# Patient Record
Sex: Male | Born: 1955
Health system: Southern US, Community
[De-identification: ages and names within clinical notes are randomized; demographics above are authoritative.]

## PROBLEM LIST (undated history)

## (undated) DIAGNOSIS — G4733 Obstructive sleep apnea (adult) (pediatric): Secondary | ICD-10-CM

## (undated) DIAGNOSIS — I10 Essential (primary) hypertension: Secondary | ICD-10-CM

## (undated) DIAGNOSIS — K602 Anal fissure, unspecified: Secondary | ICD-10-CM

## (undated) DIAGNOSIS — C801 Malignant (primary) neoplasm, unspecified: Secondary | ICD-10-CM

## (undated) DIAGNOSIS — R531 Weakness: Secondary | ICD-10-CM

## (undated) DIAGNOSIS — Z87442 Personal history of urinary calculi: Secondary | ICD-10-CM

## (undated) DIAGNOSIS — T7840XA Allergy, unspecified, initial encounter: Secondary | ICD-10-CM

## (undated) DIAGNOSIS — K760 Fatty (change of) liver, not elsewhere classified: Secondary | ICD-10-CM

## (undated) DIAGNOSIS — D49519 Neoplasm of unspecified behavior of unspecified kidney: Secondary | ICD-10-CM

## (undated) DIAGNOSIS — G4730 Sleep apnea, unspecified: Secondary | ICD-10-CM

## (undated) DIAGNOSIS — G20 Parkinson's disease: Principal | ICD-10-CM

## (undated) DIAGNOSIS — N201 Calculus of ureter: Secondary | ICD-10-CM

## (undated) DIAGNOSIS — E119 Type 2 diabetes mellitus without complications: Secondary | ICD-10-CM

## (undated) DIAGNOSIS — Z9989 Dependence on other enabling machines and devices: Secondary | ICD-10-CM

## (undated) DIAGNOSIS — F32A Depression, unspecified: Secondary | ICD-10-CM

## (undated) DIAGNOSIS — K219 Gastro-esophageal reflux disease without esophagitis: Secondary | ICD-10-CM

## (undated) DIAGNOSIS — F419 Anxiety disorder, unspecified: Secondary | ICD-10-CM

## (undated) DIAGNOSIS — J45909 Unspecified asthma, uncomplicated: Secondary | ICD-10-CM

## (undated) DIAGNOSIS — E785 Hyperlipidemia, unspecified: Secondary | ICD-10-CM

## (undated) DIAGNOSIS — Z8739 Personal history of other diseases of the musculoskeletal system and connective tissue: Secondary | ICD-10-CM

## (undated) DIAGNOSIS — E559 Vitamin D deficiency, unspecified: Secondary | ICD-10-CM

## (undated) DIAGNOSIS — G4752 REM sleep behavior disorder: Secondary | ICD-10-CM

## (undated) HISTORY — DX: Vitamin D deficiency, unspecified: E55.9

## (undated) HISTORY — DX: Hyperlipidemia, unspecified: E78.5

## (undated) HISTORY — DX: Anal fissure, unspecified: K60.2

## (undated) HISTORY — DX: REM sleep behavior disorder: G47.52

## (undated) HISTORY — DX: Allergy, unspecified, initial encounter: T78.40XA

## (undated) HISTORY — DX: Weakness: R53.1

## (undated) HISTORY — DX: Depression, unspecified: F32.A

## (undated) HISTORY — DX: Unspecified asthma, uncomplicated: J45.909

## (undated) HISTORY — DX: Malignant (primary) neoplasm, unspecified: C80.1

## (undated) HISTORY — DX: Sleep apnea, unspecified: G47.30

## (undated) HISTORY — DX: Anxiety disorder, unspecified: F41.9

## (undated) HISTORY — DX: Fatty (change of) liver, not elsewhere classified: K76.0

## (undated) HISTORY — DX: Gastro-esophageal reflux disease without esophagitis: K21.9

## (undated) HISTORY — PX: OTHER SURGICAL HISTORY: SHX169

## (undated) HISTORY — DX: Calculus of ureter: N20.1

## (undated) HISTORY — DX: Parkinson's disease: G20

---

## 1983-05-09 HISTORY — PX: NASAL SEPTUM SURGERY: SHX37

## 1999-07-27 ENCOUNTER — Encounter: Admission: RE | Admit: 1999-07-27 | Discharge: 1999-07-27 | Payer: Self-pay | Admitting: Internal Medicine

## 1999-07-27 ENCOUNTER — Encounter: Payer: Self-pay | Admitting: Internal Medicine

## 1999-08-01 ENCOUNTER — Encounter: Admission: RE | Admit: 1999-08-01 | Discharge: 1999-08-01 | Payer: Self-pay | Admitting: Internal Medicine

## 1999-08-01 ENCOUNTER — Encounter: Payer: Self-pay | Admitting: Internal Medicine

## 1999-08-10 ENCOUNTER — Encounter: Admission: RE | Admit: 1999-08-10 | Discharge: 1999-08-10 | Payer: Self-pay | Admitting: Internal Medicine

## 1999-08-10 ENCOUNTER — Encounter: Payer: Self-pay | Admitting: Internal Medicine

## 2000-08-14 ENCOUNTER — Encounter: Payer: Self-pay | Admitting: Internal Medicine

## 2000-08-14 ENCOUNTER — Encounter: Admission: RE | Admit: 2000-08-14 | Discharge: 2000-08-14 | Payer: Self-pay | Admitting: Internal Medicine

## 2000-10-08 ENCOUNTER — Encounter: Admission: RE | Admit: 2000-10-08 | Discharge: 2000-10-08 | Payer: Self-pay | Admitting: Internal Medicine

## 2000-10-08 ENCOUNTER — Encounter: Payer: Self-pay | Admitting: Internal Medicine

## 2000-10-12 ENCOUNTER — Ambulatory Visit (HOSPITAL_COMMUNITY): Admission: RE | Admit: 2000-10-12 | Discharge: 2000-10-12 | Payer: Self-pay

## 2000-10-12 HISTORY — PX: ANAL FISSURE REPAIR: SHX2312

## 2001-10-09 ENCOUNTER — Ambulatory Visit (HOSPITAL_COMMUNITY): Admission: RE | Admit: 2001-10-09 | Discharge: 2001-10-09 | Payer: Self-pay | Admitting: Gastroenterology

## 2003-02-26 ENCOUNTER — Ambulatory Visit (HOSPITAL_COMMUNITY): Admission: RE | Admit: 2003-02-26 | Discharge: 2003-02-26 | Payer: Self-pay | Admitting: Internal Medicine

## 2003-02-26 ENCOUNTER — Encounter: Payer: Self-pay | Admitting: Internal Medicine

## 2003-05-22 ENCOUNTER — Ambulatory Visit (HOSPITAL_COMMUNITY): Admission: RE | Admit: 2003-05-22 | Discharge: 2003-05-22 | Payer: Self-pay | Admitting: Internal Medicine

## 2003-09-15 ENCOUNTER — Ambulatory Visit (HOSPITAL_BASED_OUTPATIENT_CLINIC_OR_DEPARTMENT_OTHER): Admission: RE | Admit: 2003-09-15 | Discharge: 2003-09-15 | Payer: Self-pay | Admitting: Orthopaedic Surgery

## 2003-09-15 HISTORY — PX: OTHER SURGICAL HISTORY: SHX169

## 2006-05-19 ENCOUNTER — Emergency Department (HOSPITAL_COMMUNITY): Admission: EM | Admit: 2006-05-19 | Discharge: 2006-05-19 | Payer: Self-pay | Admitting: Family Medicine

## 2011-05-15 ENCOUNTER — Other Ambulatory Visit (HOSPITAL_COMMUNITY): Payer: Self-pay | Admitting: Internal Medicine

## 2011-05-15 ENCOUNTER — Ambulatory Visit (HOSPITAL_COMMUNITY)
Admission: RE | Admit: 2011-05-15 | Discharge: 2011-05-15 | Disposition: A | Discharge status: Home / Self Care | Payer: 59 | Source: Ambulatory Visit | Attending: Internal Medicine | Admitting: Internal Medicine

## 2011-05-15 DIAGNOSIS — M79609 Pain in unspecified limb: Secondary | ICD-10-CM | POA: Insufficient documentation

## 2011-06-05 ENCOUNTER — Emergency Department (HOSPITAL_COMMUNITY)
Admission: EM | Admit: 2011-06-05 | Discharge: 2011-06-05 | Disposition: A | Discharge status: Home / Self Care | Payer: 59 | Attending: Emergency Medicine | Admitting: Emergency Medicine

## 2011-06-05 ENCOUNTER — Emergency Department (HOSPITAL_COMMUNITY): Payer: 59

## 2011-06-05 ENCOUNTER — Encounter (HOSPITAL_COMMUNITY): Payer: Self-pay | Admitting: General Practice

## 2011-06-05 DIAGNOSIS — K7689 Other specified diseases of liver: Secondary | ICD-10-CM | POA: Insufficient documentation

## 2011-06-05 DIAGNOSIS — N201 Calculus of ureter: Secondary | ICD-10-CM | POA: Insufficient documentation

## 2011-06-05 DIAGNOSIS — R109 Unspecified abdominal pain: Secondary | ICD-10-CM | POA: Insufficient documentation

## 2011-06-05 DIAGNOSIS — I10 Essential (primary) hypertension: Secondary | ICD-10-CM | POA: Insufficient documentation

## 2011-06-05 DIAGNOSIS — N1330 Unspecified hydronephrosis: Secondary | ICD-10-CM | POA: Insufficient documentation

## 2011-06-05 DIAGNOSIS — R10813 Right lower quadrant abdominal tenderness: Secondary | ICD-10-CM | POA: Insufficient documentation

## 2011-06-05 HISTORY — DX: Essential (primary) hypertension: I10

## 2011-06-05 LAB — DIFFERENTIAL
Basophils Absolute: 0 10*3/uL (ref 0.0–0.1)
Basophils Relative: 0 % (ref 0–1)
Eosinophils Relative: 1 % (ref 0–5)
Monocytes Relative: 8 % (ref 3–12)
Neutrophils Relative %: 59 % (ref 43–77)

## 2011-06-05 LAB — BASIC METABOLIC PANEL
BUN: 16 mg/dL (ref 6–23)
CO2: 26 mEq/L (ref 19–32)
Calcium: 9.8 mg/dL (ref 8.4–10.5)
Chloride: 99 mEq/L (ref 96–112)
Creatinine, Ser: 1.27 mg/dL (ref 0.50–1.35)
GFR calc Af Amer: 71 mL/min — ABNORMAL LOW (ref >90)
GFR calc non Af Amer: 61 mL/min — ABNORMAL LOW (ref >90)
Glucose, Bld: 139 mg/dL — ABNORMAL HIGH (ref 70–99)
Potassium: 3.4 mEq/L — ABNORMAL LOW (ref 3.5–5.1)
Sodium: 137 mEq/L (ref 135–145)

## 2011-06-05 LAB — URINALYSIS, ROUTINE W REFLEX MICROSCOPIC
Bilirubin Urine: NEGATIVE
Glucose, UA: NEGATIVE mg/dL
Hgb urine dipstick: NEGATIVE
Ketones, ur: NEGATIVE mg/dL
Leukocytes, UA: NEGATIVE
Specific Gravity, Urine: 1.02 (ref 1.005–1.030)
Urobilinogen, UA: 0.2 mg/dL (ref 0.0–1.0)

## 2011-06-05 LAB — CBC
Hemoglobin: 15.9 g/dL (ref 13.0–17.0)
MCH: 29.7 pg (ref 26.0–34.0)
Platelets: 161 10*3/uL (ref 150–400)
RDW: 14 % (ref 11.5–15.5)

## 2011-06-05 MED ORDER — OXYCODONE-ACETAMINOPHEN 5-325 MG PO TABS: 2.0000 | ORAL_TABLET | ORAL | Status: AC | PRN

## 2011-06-05 MED ORDER — PROMETHAZINE HCL 25 MG PO TABS: 25.0000 mg | ORAL_TABLET | Freq: Four times a day (QID) | ORAL | Status: DC | PRN

## 2011-06-05 MED ORDER — ONDANSETRON HCL 4 MG/2ML IJ SOLN
4.0000 mg | Freq: Once | INTRAMUSCULAR | Status: AC
  Administered 2011-06-05: 4 mg via INTRAVENOUS
  Filled 2011-06-05: qty 2

## 2011-06-05 MED ORDER — MORPHINE SULFATE 4 MG/ML IJ SOLN
4.0000 mg | Freq: Once | INTRAMUSCULAR | Status: AC
  Administered 2011-06-05: 4 mg via INTRAVENOUS
  Filled 2011-06-05: qty 1

## 2011-06-05 MED ORDER — KETOROLAC TROMETHAMINE 30 MG/ML IJ SOLN
30.0000 mg | Freq: Once | INTRAMUSCULAR | Status: AC
  Administered 2011-06-05: 30 mg via INTRAVENOUS
  Filled 2011-06-05: qty 1

## 2011-06-05 MED ORDER — SODIUM CHLORIDE 0.9 % IV SOLN
INTRAVENOUS | Status: DC
  Administered 2011-06-05: 08:00:00 via INTRAVENOUS

## 2011-06-05 NOTE — ED Notes (Signed)
Patient transported to CT 

## 2011-06-05 NOTE — ED Provider Notes (Cosign Needed Addendum)
History     CSN: [id number]  Arrival date & time 06/05/11  0720   First MD Initiated Contact with Patient 06/05/11 [phone]      Chief Complaint  Patient presents with  . Nephrolithiasis    (Consider location/radiation/quality/duration/timing/severity/associated sxs/prior treatment) The history is provided by the patient.   the patient is a 56 year old, male, with a history of kidney stones.  His last stone was greater than 20 years ago.  He presents with right-sided flank pain that began last evening and now is radiating toward his right testicle.  It is accompanied by nausea, without any vomiting.  He has not noted any hematuria.  He has not had fevers, chills.  He denies pain anywhere else.  In any other symptoms.  Past Medical History  Diagnosis Date  . Kidney stones   . Bronchitis   . Hypertension     Past Surgical History  Procedure Date  . Knee surgery   . Enterocutaneous fistula closure   . Nasal septum surgery     No family history on file.  History  Substance Use Topics  . Smoking status: Never Smoker   . Smokeless tobacco: Not on file  . Alcohol Use: No      Review of Systems  Constitutional: Negative for fever and chills.  Respiratory: Negative for cough and shortness of breath.   Cardiovascular: Negative for chest pain.  Gastrointestinal: Positive for nausea. Negative for vomiting, abdominal pain and diarrhea.  Genitourinary: Positive for flank pain. Negative for dysuria and hematuria.  Neurological: Negative for headaches.  Psychiatric/Behavioral: Negative for confusion.  All other systems reviewed and are negative.    Allergies  Review of patient's allergies indicates no known allergies.  Home Medications   Current Outpatient Rx  Name Route Sig Dispense Refill  . ALLOPURINOL 100 MG PO TABS Oral Take 300 mg by mouth daily.     . COLCHICINE 0.6 MG PO TABS Oral Take 0.6 mg by mouth 2 (two) times daily.    Marland Kitchen ROSUVASTATIN CALCIUM 10 MG PO TABS  Oral Take 10 mg by mouth daily.    Marland Kitchen VALSARTAN 320 MG PO TABS Oral Take 320 mg by mouth daily.    Marland Kitchen VALSARTAN-HYDROCHLOROTHIAZIDE 320-25 MG PO TABS Oral Take 1 tablet by mouth daily.      BP 168/91  Pulse 85  Temp(Src) 97.7 F (36.5 C) (Oral)  Resp 20  SpO2 93%  Physical Exam  Vitals reviewed. Constitutional: He is oriented to person, place, and time. No distress.       Morbidly obese  HENT:  Head: Normocephalic and atraumatic.  Eyes: EOM are normal. Pupils are equal, round, and reactive to light.  Neck: Normal range of motion. Neck supple.  Cardiovascular: Normal rate, regular rhythm and normal heart sounds.   No murmur heard. Pulmonary/Chest: Effort normal and breath sounds normal. No respiratory distress. He has no wheezes. He has no rales.  Abdominal: Soft. Bowel sounds are normal. He exhibits no distension and no mass. There is tenderness. There is no rebound and no guarding.       Mild right lower quadrant tenderness, without any peritoneal signs  Musculoskeletal: Normal range of motion. He exhibits no edema and no tenderness.  Neurological: He is alert and oriented to person, place, and time. No cranial nerve deficit.  Skin: Skin is warm and dry. He is not diaphoretic.  Psychiatric: He has a normal mood and affect. His behavior is normal.    ED Course  Procedures (  including critical care time) 56 year old, male, complains of right flank pain, radiating to his right testicle with nausea.  He has a history of kidney stones.  Denies symptoms.  Anywhere else.  He is morbidly obese, in moderate discomfort.  We will establish an IV give him analgesics and antiemetics.  Perform laboratory testing, and a CAT scan for further evaluation.  Labs Reviewed  BASIC METABOLIC PANEL - Abnormal; Notable for the following:    Potassium 3.4 (*)    Glucose, Bld 139 (*)    GFR calc non Af Amer 61 (*)    GFR calc Af Amer 71 (*)    All other components within normal limits  CBC  DIFFERENTIAL   URINALYSIS, ROUTINE W REFLEX MICROSCOPIC   Ct Abdomen Pelvis Wo Contrast  06/05/2011  *RADIOLOGY REPORT*  Clinical Data:  Right-sided flank pain.  Question stone.  CT ABDOMEN AND PELVIS WITHOUT CONTRAST (CT UROGRAM)  Technique: Contiguous axial images of the abdomen and pelvis without oral or intravenous contrast were obtained.  Comparison: Report of a study from 10/08/2000.  Findings:  Exam is limited for evaluation of entities other than urinary tract calculi due to lack of oral or intravenous contrast.   Calcified right lower lobe granuloma. Normal heart size without pericardial or pleural effusion.  Distal left circumflex coronary artery atherosclerosis suspected on image 5 of series 2.  Moderate hepatic steatosis. A splenule.  Normal gallbladder and biliary tract.  Vascular calcification is positioned to the right of the common duct on image 34.  The pancreas is mildly fatty replaced.  Normal adrenal glands.  Bilateral renal collecting system calculi.  Mild to moderate right-sided urinary tract obstruction, secondary to a mid ureteric stone, which measures 4 mm on image 48 of series 2 and image 38 of the coronal series 5.  A retroaortic left renal vein. No retroperitoneal or retrocrural adenopathy.  Scattered colonic diverticula.  The colon is redundant, and position near the midline.  Normal terminal ileum.  Appendix not definitely identified.  Normal small bowel without abdominal ascites.  Right sided fat containing inguinal hernia. No pelvic adenopathy. Normal urinary bladder and prostate.  There is also low density in the right inguinal canal on the final images of the study.  No significant free fluid.  No acute osseous abnormality.  IMPRESSION:  1.  Mild to moderate right-sided urinary tract obstruction, secondary to a mid ureteric calculus. 2.  Bilateral renal calculi. 3.  Hepatic steatosis. 4.  Low density in the right inguinal canal.  Favor incompletely imaged inguinal position of the right  testicle.  Consider physical exam correlation.  Original Report Authenticated By: Consuello Bossier, M.D.     No diagnosis found.  9:49 AM  Says pain 2/10.  Wants to go home.    MDM  Flank pain Ureteral stone with mod. Obstruction.  No evidence of renal dysfunction or infection.  Pain controlled in the emergency department.        Nicholes Stairs, MD 06/05/11 9811  Nicholes Stairs, MD 06/05/11 1018

## 2011-06-05 NOTE — ED Notes (Signed)
Patient c/o flank pain last night. Patient stated that this morning when he awoke the pain was worse and began radiating outward towards his right testicle.

## 2011-06-07 ENCOUNTER — Other Ambulatory Visit: Payer: Self-pay | Admitting: Urology

## 2011-06-08 ENCOUNTER — Encounter (HOSPITAL_BASED_OUTPATIENT_CLINIC_OR_DEPARTMENT_OTHER): Payer: Self-pay | Admitting: *Deleted

## 2011-06-08 NOTE — Progress Notes (Addendum)
NPO AFTER MN. ARRIVES AT 0845. NEEDS ISTAT, EKG, KUB AND WEIGHT .  WILL TAKES CRESTOR AND DO INHALER AM OF SURG. W/ SIP OF WATER. ALSO, MAY TAKE DILAUDID/ PHENERGAN IF NEEDED.  REVIEWED CHART W/ DR EWELL MDA, STATES WEIGH PT AND MDA ON DOS WILL ASSESS.

## 2011-06-11 NOTE — H&P (Signed)
History of Present Illness  56 y/o obese white male with h/o nephrolithiasis presents for acute evaluation of right flank pain.  He began with right flank pain Sunday 06/04/11 which progressively worsened.  He presented to [facility] 06/05/11 for evaluation and CTU demonstrated a 4-66mm right mid ureteral calculus at the level of L4.  Bilateral nonobstructing renal calculi noted as well.  He was discharged home with percocet and phenergan.  He has not had any N/V but the pain has peristed intermittently severe and only minimal relief with percocet.  Currently, his pain is 6-7/10 in severity despite Percocet this morning.  He denies gross hematuria, dysuria, fever or chills.  His pain does radiate from the right flank into the RLQ abd and into the right inguinal region and testicle.  He has not seen a stone pass.  His last prior stone was 20+ years ago.   Past Medical History Problems  1. History of  Adult Sleep Apnea 780.57 2. History of  Anxiety (Symptom) 300.00 3. History of  Gastric Cancer V10.04 4. History of  Glaucoma 365.9 5. History of  Heartburn With Regurgitation 787.1 6. History of  Hypertension 401.9  Surgical History Problems  1. History of  Anal Fistulotomy (Subcutaneous) 2. History of  Knee Surgery 3. History of  Nose Surgery  Current Meds 1. Allopurinol TABS; Therapy: (Recorded:14Jul2011) to 2. ALPRAZolam 0.5 MG Oral Tablet; Therapy: (Recorded:14Jul2011) to 3. Colcrys 0.6 MG Oral Tablet; Therapy: (Recorded:30Jan2013) to 4. Diovan HCT 320-25 MG Oral Tablet; Therapy: 02May2011 to 5. Oxycodone-Acetaminophen 5-325 MG Oral Tablet; Therapy: (Recorded:30Jan2013) to  Allergies Medication  1. No Known Drug Allergies  Family History Problems  1. Maternal history of  Atrial Fibrillation 2. Maternal history of  Heart Disease V17.49 3. Maternal history of  Hypertension V17.49 4. Paternal history of  Lung Cancer V16.1  Social History Problems  1. Alcohol Use 2. Caffeine Use 3.  Marital History - Currently Married 4. Never A Smoker 5. Occupation: [employer]  Review of Systems Genitourinary, constitutional, skin, eye, otolaryngeal, hematologic/lymphatic, cardiovascular, pulmonary, endocrine, musculoskeletal, gastrointestinal, neurological and psychiatric system(s) were reviewed and pertinent findings if present are noted.  Genitourinary: testicular pain and inguinal pain.  Gastrointestinal: flank pain and abdominal pain.    Vitals Vital Signs [Data Includes: Last 1 Day]  30Jan2013 09:15AM  BMI Calculated: 51.01 BSA Calculated: 2.48 Height: 5 ft 7 in Weight: 325 lb  Blood Pressure: 135 / 79 Temperature: 98.6 F Heart Rate: 81  Physical Exam Constitutional: Well nourished and well developed . No acute distress.  ENT:. The ears and nose are normal in appearance.  Neck: The appearance of the neck is normal and no neck mass is present.  Pulmonary: No respiratory distress and normal respiratory rhythm and effort.  Cardiovascular:. No peripheral edema.  Abdomen: No CVA tenderness.  Skin: Normal skin turgor, no visible rash and no visible skin lesions.  Neuro/Psych:. Mood and affect are appropriate.    Results/Data Urine [Data Includes: Last 1 Day]   30Jan2013  COLOR YELLOW   APPEARANCE CLEAR   SPECIFIC GRAVITY 1.025   pH 5.5   GLUCOSE NEG mg/dL  BILIRUBIN NEG   KETONE NEG mg/dL  BLOOD TRACE   PROTEIN NEG mg/dL  UROBILINOGEN 0.2 mg/dL  NITRITE NEG   LEUKOCYTE ESTERASE NEG   SQUAMOUS EPITHELIAL/HPF NONE SEEN   WBC 0-3 WBC/hpf  RBC NONE SEEN RBC/hpf  BACTERIA NONE SEEN   CRYSTALS NONE SEEN   CASTS NONE SEEN   Other SM. AMT. MUCUS  OBS.    Old records or history reviewed:Marland Kitchen  I personally reviewed H&P, labs and CTU from recent ED visit 06/05/11.  The following images/tracing/specimen were independently visualized: Marland Kitchen     KUB demonstrates bilateral nonobstructing renal stones.  I cannot visualize the right ureteral stone.  The following clinical  lab reports were reviewed: Marland Kitchen  Urinalysis Selected Results  UA With REFLEX 30Jan2013 08:47AM Bruning, Ashlyn   Test Name Result Flag Reference  COLOR YELLOW  YELLOW  APPEARANCE CLEAR  CLEAR  SPECIFIC GRAVITY 1.025  1.005-1.030  pH 5.5  5.0-8.0  GLUCOSE NEG mg/dL  NEG  BILIRUBIN NEG  NEG  KETONE NEG mg/dL  NEG  BLOOD TRACE A NEG  PROTEIN NEG mg/dL  NEG  UROBILINOGEN 0.2 mg/dL  1.6-1.0  NITRITE NEG  NEG  LEUKOCYTE ESTERASE NEG  NEG  SQUAMOUS EPITHELIAL/HPF NONE SEEN  RARE  WBC 0-3 WBC/hpf  <4  RBC NONE SEEN RBC/hpf  <4  BACTERIA NONE SEEN  RARE  CRYSTALS NONE SEEN  NEG  CASTS NONE SEEN  NEG  Other SM. AMT. MUCUS OBS.     Assessment Assessed  1. Ureteral Stone Right 592.1 2. Diffuse Abdominal Pain 789.07  Plan Health Maintenance (V70.0)  1. UA With REFLEX  Done: 30Jan2013 08:47AM Ureteral Stone (592.1)  2. KUB  Done: 30Jan2013 12:00AM   Toradal 60mg  IM today in the office with excellent pain relief. Rapaflo 8mg  po qd (samples provided) for MET. Strain urine. Schedule cystoureteroscopy with RGP, possible HLL, stone extraction and possible DJ stent placement. Dilaudid 4mg  po prn pain.  Rx provided. Escript sent for Toradol 10mg  po TID prn pain.

## 2011-06-12 ENCOUNTER — Other Ambulatory Visit: Payer: Self-pay

## 2011-06-12 ENCOUNTER — Ambulatory Visit (HOSPITAL_BASED_OUTPATIENT_CLINIC_OR_DEPARTMENT_OTHER)
Admission: RE | Admit: 2011-06-12 | Discharge: 2011-06-12 | Disposition: A | Discharge status: Home / Self Care | Payer: 59 | Source: Ambulatory Visit | Attending: Urology | Admitting: Urology

## 2011-06-12 ENCOUNTER — Encounter (HOSPITAL_BASED_OUTPATIENT_CLINIC_OR_DEPARTMENT_OTHER): Payer: Self-pay | Admitting: Anesthesiology

## 2011-06-12 ENCOUNTER — Encounter (HOSPITAL_BASED_OUTPATIENT_CLINIC_OR_DEPARTMENT_OTHER)
Admission: RE | Disposition: A | Discharge status: Home / Self Care | Payer: Self-pay | Source: Ambulatory Visit | Attending: Urology

## 2011-06-12 ENCOUNTER — Ambulatory Visit (HOSPITAL_COMMUNITY): Payer: 59

## 2011-06-12 ENCOUNTER — Ambulatory Visit (HOSPITAL_BASED_OUTPATIENT_CLINIC_OR_DEPARTMENT_OTHER): Payer: 59 | Admitting: Anesthesiology

## 2011-06-12 DIAGNOSIS — Z01812 Encounter for preprocedural laboratory examination: Secondary | ICD-10-CM | POA: Insufficient documentation

## 2011-06-12 DIAGNOSIS — Z79899 Other long term (current) drug therapy: Secondary | ICD-10-CM | POA: Insufficient documentation

## 2011-06-12 DIAGNOSIS — I10 Essential (primary) hypertension: Secondary | ICD-10-CM | POA: Insufficient documentation

## 2011-06-12 DIAGNOSIS — H409 Unspecified glaucoma: Secondary | ICD-10-CM | POA: Insufficient documentation

## 2011-06-12 DIAGNOSIS — Z01810 Encounter for preprocedural cardiovascular examination: Secondary | ICD-10-CM | POA: Insufficient documentation

## 2011-06-12 DIAGNOSIS — Z85028 Personal history of other malignant neoplasm of stomach: Secondary | ICD-10-CM | POA: Insufficient documentation

## 2011-06-12 DIAGNOSIS — G4730 Sleep apnea, unspecified: Secondary | ICD-10-CM | POA: Insufficient documentation

## 2011-06-12 DIAGNOSIS — N201 Calculus of ureter: Secondary | ICD-10-CM

## 2011-06-12 HISTORY — DX: Calculus of ureter: N20.1

## 2011-06-12 HISTORY — DX: Personal history of other diseases of the musculoskeletal system and connective tissue: Z87.39

## 2011-06-12 HISTORY — DX: Obstructive sleep apnea (adult) (pediatric): G47.33

## 2011-06-12 HISTORY — DX: Dependence on other enabling machines and devices: Z99.89

## 2011-06-12 HISTORY — PX: STONE EXTRACTION WITH BASKET: SHX5318

## 2011-06-12 LAB — POCT I-STAT 4, (NA,K, GLUC, HGB,HCT)
Glucose, Bld: 128 mg/dL — ABNORMAL HIGH (ref 70–99)
HCT: 41 % (ref 39.0–52.0)
Hemoglobin: 13.9 g/dL (ref 13.0–17.0)
Potassium: 3.9 mEq/L (ref 3.5–5.1)
Sodium: 140 mEq/L (ref 135–145)

## 2011-06-12 SURGERY — CYSTOURETEROSCOPY, WITH RETROGRADE PYELOGRAM AND STENT INSERTION
Anesthesia: General | Site: Ureter | Laterality: Right | Wound class: Clean Contaminated

## 2011-06-12 MED ORDER — PHENAZOPYRIDINE HCL 200 MG PO TABS: 200.0000 mg | ORAL_TABLET | Freq: Three times a day (TID) | ORAL | Status: AC | PRN

## 2011-06-12 MED ORDER — HYDROCODONE-ACETAMINOPHEN 10-300 MG PO TABS: 1.0000 | ORAL_TABLET | Freq: Four times a day (QID) | ORAL | Status: DC | PRN

## 2011-06-12 MED ORDER — SODIUM CHLORIDE 0.9 % IR SOLN
Status: DC | PRN
  Administered 2011-06-12: 6000 mL

## 2011-06-12 MED ORDER — TAMSULOSIN HCL 0.4 MG PO CAPS: 0.4000 mg | ORAL_CAPSULE | Freq: Once | ORAL | Status: DC

## 2011-06-12 MED ORDER — LIDOCAINE HCL (CARDIAC) 20 MG/ML IV SOLN
INTRAVENOUS | Status: DC | PRN
  Administered 2011-06-12: 100 mg via INTRAVENOUS

## 2011-06-12 MED ORDER — PHENAZOPYRIDINE HCL 200 MG PO TABS: 200.0000 mg | ORAL_TABLET | Freq: Once | ORAL | Status: DC

## 2011-06-12 MED ORDER — LACTATED RINGERS IV SOLN
INTRAVENOUS | Status: DC
  Administered 2011-06-12 (×2): via INTRAVENOUS

## 2011-06-12 MED ORDER — GLYCOPYRROLATE 0.2 MG/ML IJ SOLN
INTRAMUSCULAR | Status: DC | PRN
  Administered 2011-06-12 (×2): 0.2 mg via INTRAVENOUS

## 2011-06-12 MED ORDER — FENTANYL CITRATE 0.05 MG/ML IJ SOLN: 25.0000 ug | INTRAMUSCULAR | Status: DC | PRN

## 2011-06-12 MED ORDER — CEFAZOLIN SODIUM 1-5 GM-% IV SOLN
1.0000 g | INTRAVENOUS | Status: AC
  Administered 2011-06-12: 2 g via INTRAVENOUS

## 2011-06-12 MED ORDER — OXYBUTYNIN CHLORIDE 5 MG PO TABS: 5.0000 mg | ORAL_TABLET | Freq: Once | ORAL | Status: DC

## 2011-06-12 MED ORDER — MIDAZOLAM HCL 5 MG/5ML IJ SOLN
INTRAMUSCULAR | Status: DC | PRN
  Administered 2011-06-12: 1 mg via INTRAVENOUS

## 2011-06-12 MED ORDER — DEXAMETHASONE SODIUM PHOSPHATE 4 MG/ML IJ SOLN
INTRAMUSCULAR | Status: DC | PRN
  Administered 2011-06-12: 10 mg via INTRAVENOUS

## 2011-06-12 MED ORDER — PROMETHAZINE HCL 25 MG/ML IJ SOLN: 6.2500 mg | INTRAMUSCULAR | Status: DC | PRN

## 2011-06-12 MED ORDER — PROPOFOL 10 MG/ML IV EMUL
INTRAVENOUS | Status: DC | PRN
  Administered 2011-06-12: 400 mg via INTRAVENOUS

## 2011-06-12 MED ORDER — FENTANYL CITRATE 0.05 MG/ML IJ SOLN
INTRAMUSCULAR | Status: DC | PRN
  Administered 2011-06-12 (×2): 25 ug via INTRAVENOUS
  Administered 2011-06-12: 50 ug via INTRAVENOUS

## 2011-06-12 MED ORDER — ONDANSETRON HCL 4 MG/2ML IJ SOLN
INTRAMUSCULAR | Status: DC | PRN
  Administered 2011-06-12: 4 mg via INTRAVENOUS

## 2011-06-12 MED ORDER — KETOROLAC TROMETHAMINE 30 MG/ML IJ SOLN
INTRAMUSCULAR | Status: DC | PRN
  Administered 2011-06-12: 30 mg via INTRAVENOUS

## 2011-06-12 MED ORDER — IOHEXOL 350 MG/ML SOLN
INTRAVENOUS | Status: DC | PRN
  Administered 2011-06-12: 50 mL

## 2011-06-12 SURGICAL SUPPLY — 39 items
ADAPTER CATH URET PLST 4-6FR (CATHETERS) IMPLANT
ADPR CATH URET STRL DISP 4-6FR (CATHETERS)
BAG DRAIN URO-CYSTO SKYTR STRL (DRAIN) ×2 IMPLANT
BAG DRN UROCATH (DRAIN) ×1
BASKET LASER NITINOL 1.9FR (BASKET) IMPLANT
BASKET STNLS GEMINI 4WIRE 3FR (BASKET) IMPLANT
BASKET ZERO TIP NITINOL 2.4FR (BASKET) ×1 IMPLANT
BRUSH URET BIOPSY 3F (UROLOGICAL SUPPLIES) IMPLANT
BSKT STON RTRVL 120 1.9FR (BASKET)
BSKT STON RTRVL GEM 120X11 3FR (BASKET)
BSKT STON RTRVL ZERO TP 2.4FR (BASKET) ×1
CANISTER SUCT LVC 12 LTR MEDI- (MISCELLANEOUS) ×1 IMPLANT
CATH INTERMIT  6FR 70CM (CATHETERS) ×1 IMPLANT
CATH URET 5FR 28IN CONE TIP (BALLOONS)
CATH URET 5FR 70CM CONE TIP (BALLOONS) IMPLANT
CLOTH BEACON ORANGE TIMEOUT ST (SAFETY) ×2 IMPLANT
DRAPE CAMERA CLOSED 9X96 (DRAPES) ×2 IMPLANT
ELECT REM PT RETURN 9FT ADLT (ELECTROSURGICAL)
ELECTRODE REM PT RTRN 9FT ADLT (ELECTROSURGICAL) IMPLANT
GLOVE BIO SURGEON STRL SZ8 (GLOVE) ×2 IMPLANT
GLOVE BIOGEL M 6.5 STRL (GLOVE) ×1 IMPLANT
GLOVE ECLIPSE 6.0 STRL STRAW (GLOVE) ×1 IMPLANT
GOWN PREVENTION PLUS LG XLONG (DISPOSABLE) ×2 IMPLANT
GOWN STRL REIN XL XLG (GOWN DISPOSABLE) ×2 IMPLANT
GUIDEWIRE 0.038 PTFE COATED (WIRE) IMPLANT
GUIDEWIRE ANG ZIPWIRE 038X150 (WIRE) IMPLANT
GUIDEWIRE STR DUAL SENSOR (WIRE) ×2 IMPLANT
IV NS IRRIG 3000ML ARTHROMATIC (IV SOLUTION) ×4 IMPLANT
KIT BALLIN UROMAX 15FX10 (LABEL) IMPLANT
KIT BALLN UROMAX 15FX4 (MISCELLANEOUS) IMPLANT
KIT BALLN UROMAX 26 75X4 (MISCELLANEOUS)
LASER FIBER DISP (UROLOGICAL SUPPLIES) IMPLANT
PACK CYSTOSCOPY (CUSTOM PROCEDURE TRAY) ×2 IMPLANT
SET HIGH PRES BAL DIL (LABEL)
SHEATH URET ACCESS 12FR/35CM (UROLOGICAL SUPPLIES) IMPLANT
SHEATH URET ACCESS 12FR/55CM (UROLOGICAL SUPPLIES) IMPLANT
STENT PERCUFLEX 4.8FRX26 (STENTS) ×1 IMPLANT
TAPE HY-TAPE .5X5Y PINK LF (GAUZE/BANDAGES/DRESSINGS) ×1 IMPLANT
WATER STERILE IRR 3000ML UROMA (IV SOLUTION) IMPLANT

## 2011-06-12 NOTE — Anesthesia Preprocedure Evaluation (Signed)
Anesthesia Evaluation  Patient identified by MRN, date of birth, ID band Patient awake    Reviewed: Allergy & Precautions, H&P , NPO status , Patient's Chart, lab work & pertinent test results, reviewed documented beta blocker date and time   Airway Mallampati: III TM Distance: >3 FB Neck ROM: Full    Dental  (+) Dental Advisory Given   Pulmonary sleep apnea and Continuous Positive Airway Pressure Ventilation ,  clear to auscultation        Cardiovascular hypertension, Pt. on medications Regular Normal Denies cardiac symptoms   Neuro/Psych Negative Neurological ROS  Negative Psych ROS   GI/Hepatic negative GI ROS, Neg liver ROS,   Endo/Other  Morbid obesity  Renal/GU Kidney stone  Genitourinary negative   Musculoskeletal negative musculoskeletal ROS (+)   Abdominal   Peds negative pediatric ROS (+)  Hematology negative hematology ROS (+)   Anesthesia Other Findings Upper front bridge  Reproductive/Obstetrics negative OB ROS                           Anesthesia Physical Anesthesia Plan  ASA: III  Anesthesia Plan: General   Post-op Pain Management:    Induction:   Airway Management Planned: LMA  Additional Equipment:   Intra-op Plan:   Post-operative Plan: Extubation in OR  Informed Consent: I have reviewed the patients History and Physical, chart, labs and discussed the procedure including the risks, benefits and alternatives for the proposed anesthesia with the patient or authorized representative who has indicated his/her understanding and acceptance.     Plan Discussed with: CRNA and Surgeon  Anesthesia Plan Comments:         Anesthesia Quick Evaluation

## 2011-06-12 NOTE — Anesthesia Procedure Notes (Signed)
Procedure Name: LMA Insertion Date/Time: 06/12/2011 10:13 AM Performed by: Huel Coventry Pre-anesthesia Checklist: Patient identified, Emergency Drugs available, Suction available and Patient being monitored Patient Re-evaluated:Patient Re-evaluated prior to inductionOxygen Delivery Method: Circle System Utilized Preoxygenation: Pre-oxygenation with 100% oxygen Intubation Type: IV induction Ventilation: Mask ventilation without difficulty LMA: LMA with gastric port inserted LMA Size: 5.0 Number of attempts: 1 Placement Confirmation: positive ETCO2 Tube secured with: Tape Dental Injury: Teeth and Oropharynx as per pre-operative assessment

## 2011-06-12 NOTE — Interval H&P Note (Signed)
History and Physical Interval Note:  06/12/2011 9:28 AM  Todd Rice  has presented today for surgery, with the diagnosis of RIGHT MID URETERAL STONE  The various methods of treatment have been discussed with the patient and family. After consideration of risks, benefits and other options for treatment, the patient has consented to  Procedure(s): CYSTOSCOPY WITH RETROGRADE PYELOGRAM, URETEROSCOPY AND STENT PLACEMENT as a surgical intervention .  The patients' history has been reviewed, patient examined, no change in status, stable for surgery.  I have reviewed the patients' chart and labs.  Questions were answered to the patient's satisfaction.     Garnett Farm

## 2011-06-12 NOTE — Op Note (Signed)
PATIENT:  Todd Rice  PRE-OPERATIVE DIAGNOSIS:  right Ureteral calculus  POST-OPERATIVE DIAGNOSIS: Same  PROCEDURE:  1. Cystoscopy with right retrograde pyelogram including interpretation. 2. Right ureteroscopy with stone extraction. 3. Right double-J stent placement  SURGEON: Garnett Farm, MD  INDICATION: Mr. Schlabach is a 56 year old male with a history of nephrolithiasis. He presented to the office recently with right flank pain after having been seen in the emergency room for a 4-5 mm right midureteral stone. KUB in the office did not definitively identify the stone however he continues to have pain and his preop KUB reveals the density in the pelvis on the right-hand side near the ureteropelvic junction.  ANESTHESIA:  General  EBL:  Minimal  DRAINS: 5 French, 26 cm double-J stent (with string)  SPECIMEN:  Stone was given to the patient.  DESCRIPTION OF PROCEDURE: The patient was taken to the major OR and placed on the table. General anesthesia was administered and then the patient was moved to the dorsal lithotomy position. The genitalia was sterilely prepped and draped. An official timeout was performed.  Initially the 22 French cystoscope with 12 lens was passed under direct vision down the urethra which was noted be normal. The prostatic urethra revealed no significant lateral lobe hypertrophy but there was a fairly high bladder neck.. The bladder was then entered and fully inspected. It was noted be free of any tumors stones or inflammatory lesions. Ureteral orifices were of normal configuration and position. A 6 French open-ended ureteral catheter was then passed through the cystoscope into the ureteral orifice in order to perform a right retrograde pyelogram.  A retrograde pyelogram was performed by injecting full-strength contrast up the right ureter under direct fluoroscopic control. It revealed a filling defect in the distal lureter consistent with the stone seen on  the preoperative KUB. The remainder of the ureter was noted to be normal as was the intrarenal collecting system. I then passed a 0.038 inch floppy-tipped guidewire through the open ended catheter and into the area of the renal pelvis and this was left in place. The inner portion of a ureteral access sheath was then passed over the guidewire to gently dilate the intramural ureter. I then proceeded with ureteroscopy.  A 6 French rigid ureteroscope was then passed under direct into the bladder and into the right orifice and up the ureter. The stone was identified and was grasped with the Nitinol basket and extracted without difficulty. I then backloaded the cystoscope over the guidewire and passed the stent over the guidewire into the area of the renal pelvis. As the guidewire was removed good curl was noted in the renal pelvis. The bladder was drained and the cystoscope was then removed. The patient tolerated the procedure well no intraoperative complications.  PLAN OF CARE: Discharge to home after PACU  PATIENT DISPOSITION:  PACU - hemodynamically stable.

## 2011-06-12 NOTE — Anesthesia Postprocedure Evaluation (Signed)
  Anesthesia Post-op Note  Patient: Todd Rice  Procedure(s) Performed:  CYSTOSCOPY WITH RETROGRADE PYELOGRAM, URETEROSCOPY AND STENT PLACEMENT; STONE EXTRACTION WITH BASKET  Patient Location: PACU  Anesthesia Type: General  Level of Consciousness: oriented and sedated  Airway and Oxygen Therapy: Patient Spontanous Breathing  Post-op Pain: mild  Post-op Assessment: Post-op Vital signs reviewed, Patient's Cardiovascular Status Stable, Respiratory Function Stable and Patent Airway  Post-op Vital Signs: stable  Complications: No apparent anesthesia complications

## 2011-06-12 NOTE — Transfer of Care (Signed)
Immediate Anesthesia Transfer of Care Note  Patient: Todd Rice  Procedure(s) Performed:  CYSTOSCOPY WITH RETROGRADE PYELOGRAM, URETEROSCOPY AND STENT PLACEMENT; STONE EXTRACTION WITH BASKET  Patient Location: PACU  Anesthesia Type: General  Level of Consciousness: awake, alert  and oriented  Airway & Oxygen Therapy: Patient Spontanous Breathing and Patient connected to face mask oxygen  Post-op Assessment: Report given to PACU RN and Post -op Vital signs reviewed and stable  Post vital signs: Reviewed and stable  Complications: No apparent anesthesia complications

## 2011-06-13 ENCOUNTER — Encounter (HOSPITAL_BASED_OUTPATIENT_CLINIC_OR_DEPARTMENT_OTHER): Payer: Self-pay | Admitting: Urology

## 2011-08-22 ENCOUNTER — Encounter: Payer: Self-pay | Admitting: Gastroenterology

## 2011-09-20 ENCOUNTER — Ambulatory Visit: Payer: 59 | Admitting: Gastroenterology

## 2011-10-01 ENCOUNTER — Encounter (HOSPITAL_COMMUNITY): Payer: Self-pay | Admitting: *Deleted

## 2011-10-01 ENCOUNTER — Emergency Department (HOSPITAL_COMMUNITY)
Admission: EM | Admit: 2011-10-01 | Discharge: 2011-10-01 | Disposition: A | Discharge status: Home / Self Care | Payer: 59 | Attending: Emergency Medicine | Admitting: Emergency Medicine

## 2011-10-01 ENCOUNTER — Emergency Department (HOSPITAL_COMMUNITY): Payer: 59

## 2011-10-01 DIAGNOSIS — N201 Calculus of ureter: Secondary | ICD-10-CM | POA: Insufficient documentation

## 2011-10-01 DIAGNOSIS — N132 Hydronephrosis with renal and ureteral calculous obstruction: Secondary | ICD-10-CM

## 2011-10-01 DIAGNOSIS — Z6841 Body Mass Index (BMI) 40.0 and over, adult: Secondary | ICD-10-CM | POA: Insufficient documentation

## 2011-10-01 DIAGNOSIS — M109 Gout, unspecified: Secondary | ICD-10-CM | POA: Insufficient documentation

## 2011-10-01 DIAGNOSIS — Z79899 Other long term (current) drug therapy: Secondary | ICD-10-CM | POA: Insufficient documentation

## 2011-10-01 DIAGNOSIS — N1330 Unspecified hydronephrosis: Secondary | ICD-10-CM | POA: Insufficient documentation

## 2011-10-01 DIAGNOSIS — K760 Fatty (change of) liver, not elsewhere classified: Secondary | ICD-10-CM

## 2011-10-01 DIAGNOSIS — G4733 Obstructive sleep apnea (adult) (pediatric): Secondary | ICD-10-CM | POA: Insufficient documentation

## 2011-10-01 DIAGNOSIS — R109 Unspecified abdominal pain: Secondary | ICD-10-CM | POA: Insufficient documentation

## 2011-10-01 DIAGNOSIS — I10 Essential (primary) hypertension: Secondary | ICD-10-CM | POA: Insufficient documentation

## 2011-10-01 HISTORY — DX: Fatty (change of) liver, not elsewhere classified: K76.0

## 2011-10-01 LAB — URINALYSIS, ROUTINE W REFLEX MICROSCOPIC
Bilirubin Urine: NEGATIVE
Glucose, UA: NEGATIVE mg/dL
Hgb urine dipstick: NEGATIVE
Ketones, ur: NEGATIVE mg/dL
Nitrite: POSITIVE — AB
Protein, ur: NEGATIVE mg/dL
Specific Gravity, Urine: 1.023 (ref 1.005–1.030)
Urobilinogen, UA: 0.2 mg/dL (ref 0.0–1.0)
pH: 6 (ref 5.0–8.0)

## 2011-10-01 LAB — POCT I-STAT, CHEM 8
Calcium, Ion: 1.24 mmol/L (ref 1.12–1.32)
Chloride: 105 mEq/L (ref 96–112)
Glucose, Bld: 168 mg/dL — ABNORMAL HIGH (ref 70–99)
HCT: 45 % (ref 39.0–52.0)
Hemoglobin: 15.3 g/dL (ref 13.0–17.0)
TCO2: 25 mmol/L (ref 0–100)

## 2011-10-01 LAB — URINE MICROSCOPIC-ADD ON

## 2011-10-01 MED ORDER — ONDANSETRON HCL 4 MG/2ML IJ SOLN
4.0000 mg | Freq: Once | INTRAMUSCULAR | Status: AC
  Administered 2011-10-01: 4 mg via INTRAVENOUS
  Filled 2011-10-01: qty 2

## 2011-10-01 MED ORDER — SODIUM CHLORIDE 0.9 % IV BOLUS (SEPSIS)
1000.0000 mL | Freq: Once | INTRAVENOUS | Status: AC
  Administered 2011-10-01: 1000 mL via INTRAVENOUS

## 2011-10-01 MED ORDER — TAMSULOSIN HCL 0.4 MG PO CAPS
0.4000 mg | ORAL_CAPSULE | Freq: Once | ORAL | Status: AC
  Administered 2011-10-01: 0.4 mg via ORAL
  Filled 2011-10-01: qty 1

## 2011-10-01 MED ORDER — HYDROMORPHONE HCL PF 1 MG/ML IJ SOLN
1.0000 mg | Freq: Once | INTRAMUSCULAR | Status: AC
  Administered 2011-10-01: 1 mg via INTRAMUSCULAR
  Filled 2011-10-01: qty 1

## 2011-10-01 MED ORDER — PROMETHAZINE HCL 25 MG PO TABS: 25.0000 mg | ORAL_TABLET | Freq: Four times a day (QID) | ORAL | Status: DC | PRN

## 2011-10-01 MED ORDER — KETOROLAC TROMETHAMINE 30 MG/ML IJ SOLN
30.0000 mg | Freq: Once | INTRAMUSCULAR | Status: AC
  Administered 2011-10-01: 30 mg via INTRAVENOUS
  Filled 2011-10-01: qty 1

## 2011-10-01 MED ORDER — TAMSULOSIN HCL 0.4 MG PO CAPS: 0.4000 mg | ORAL_CAPSULE | Freq: Every day | ORAL | Status: DC

## 2011-10-01 NOTE — Discharge Instructions (Signed)
You will need to see Dr. Vernie Ammons as soon as possible.  They will need to recheck your kidney function as well.  Return to the emergency department for any worsening in her condition.  Slowly increase her fluids with Gatorade type liquids.

## 2011-10-01 NOTE — ED Provider Notes (Signed)
History     CSN: [id number]  Arrival date & time 10/01/11  1220   First MD Initiated Contact with Patient 10/01/11 1238      Chief Complaint  Patient presents with  . Flank Pain    left  . Nausea    (Consider location/radiation/quality/duration/timing/severity/associated sxs/prior treatment) HPI Patient presents emergency Dept. with a 3 hour history of left flank pain that radiates to his left lower abdomen.  Patient states he has some nausea, but does not have any vomiting, or diarrhea.  He has a history of kidney stones and he has had multiple UTIs since February.  Patient denies chest pain, shortness of breath, weakness, fever, or difficulty urinating.  Patient states he tried to take some pain medication prior to arrival without relief.  Past Medical History  Diagnosis Date  . Bronchitis STATES DX IN DEC ALONG W/ SINUSITIS    COUGH/ CONGESTION STILL LINGERING-- BUT NO FEVER AND USES INHALER BID  . Hypertension   . Right ureteral stone   . Right flank pain DUE TO KIDNEY STONE  . H/O: gout STABLE  . OSA on CPAP   . Adult BMI 50.0-59.9 kg/sq m 52.77  . UTI (urinary tract infection)     Past Surgical History  Procedure Date  . Anal fissure repair 10-12-2000  . Nasal septum surgery 1985  . Left medial femoral condyle debridement & drilling/ removal loose body 09-15-2003  . Stone extraction with basket 06/12/2011    Procedure: STONE EXTRACTION WITH BASKET;  Surgeon: Garnett Farm, MD;  Location: [facility];  Service: Urology;  Laterality: Right;  . Kidney stone removal     06/2011-stent placement    History reviewed. No pertinent family history.  History  Substance Use Topics  . Smoking status: Never Smoker   . Smokeless tobacco: Never Used  . Alcohol Use: Yes     rarely      Review of Systems All other systems negative except as documented in the HPI. All pertinent positives and negatives as reviewed in the HPI.  Allergies  Review of patient's  allergies indicates no known allergies.  Home Medications   Current Outpatient Rx  Name Route Sig Dispense Refill  . VENTOLIN IN Inhalation Inhale 2 puffs into the lungs 2 (two) times daily.    . ALLOPURINOL 100 MG PO TABS Oral Take 300 mg by mouth daily.     . COLCHICINE 0.6 MG PO TABS Oral Take 0.6 mg by mouth 2 (two) times daily.    Marland Kitchen ROSUVASTATIN CALCIUM 10 MG PO TABS Oral Take 10 mg by mouth every morning.     Marland Kitchen VALSARTAN-HYDROCHLOROTHIAZIDE 320-25 MG PO TABS Oral Take 1 tablet by mouth every morning.       BP 196/99  Pulse 96  Temp(Src) 98.1 F (36.7 C) (Oral)  Resp 20  Wt 328 lb 7.8 oz (149 kg)  SpO2 100%  Physical Exam  Nursing note and vitals reviewed. Constitutional: He appears well-developed and well-nourished. He appears distressed.  HENT:  Head: Normocephalic and atraumatic.  Cardiovascular: Normal rate, regular rhythm and normal heart sounds.  Exam reveals no gallop and no friction rub.   No murmur heard. Pulmonary/Chest: Effort normal and breath sounds normal. No respiratory distress.  Abdominal: Soft. Normal appearance and bowel sounds are normal. He exhibits no distension. There is no hepatosplenomegaly. There is no rigidity, no rebound and no guarding.    Skin: Skin is warm and dry. No rash noted.  ED Course  Procedures (including critical care time)  Labs Reviewed  URINALYSIS, ROUTINE W REFLEX MICROSCOPIC - Abnormal; Notable for the following:    Color, Urine AMBER (*) BIOCHEMICALS MAY BE AFFECTED BY COLOR   APPearance CLOUDY (*)    Nitrite POSITIVE (*)    Leukocytes, UA MODERATE (*)    All other components within normal limits  POCT I-STAT, CHEM 8 - Abnormal; Notable for the following:    Creatinine, Ser 1.60 (*)    Glucose, Bld 168 (*)    All other components within normal limits  URINE MICROSCOPIC-ADD ON   Patient will be treated with pain control and will obtain CT scan as well as basic lab tests.    2:10 PM recheck the patient and he has  started to have more pain and will give him further pain control. 3:15 PM patient is feeling completely better with no symptoms at this time.  I advised the family to see Dr. Vernie Ammons as soon as possible and will need to recheck his kidney function.  Patient is advised to return here for any worsening or changes in his symptoms and his condition.  MDM  MDM Reviewed: vitals and nursing note Interpretation: labs and CT scan           Carlyle Dolly, PA-C 10/01/11 1519  Carlyle Dolly, PA-C 10/01/11 1520

## 2011-10-01 NOTE — ED Notes (Signed)
Pt from home with reports of left flank pain that radiates to left abdomen that started about 3 hours ago. Pt endorses nausea but denies vomiting or diarrhea. Pt also reports hx of kidney stones.

## 2011-10-02 NOTE — ED Provider Notes (Signed)
Medical screening examination/treatment/procedure(s) were performed by non-physician practitioner and as supervising physician I was immediately available for consultation/collaboration.  Flint Melter, MD 10/02/11 [phone]

## 2011-10-03 LAB — URINE CULTURE
Colony Count: NO GROWTH
Culture  Setup Time: 201305270458
Culture: NO GROWTH

## 2011-10-24 ENCOUNTER — Ambulatory Visit: Payer: 59 | Admitting: Gastroenterology

## 2011-11-08 ENCOUNTER — Other Ambulatory Visit (HOSPITAL_COMMUNITY): Payer: Self-pay | Admitting: Internal Medicine

## 2011-11-08 DIAGNOSIS — R599 Enlarged lymph nodes, unspecified: Secondary | ICD-10-CM

## 2011-11-10 ENCOUNTER — Ambulatory Visit (HOSPITAL_COMMUNITY)
Admission: RE | Admit: 2011-11-10 | Discharge: 2011-11-10 | Disposition: A | Discharge status: Home / Self Care | Payer: 59 | Source: Ambulatory Visit | Attending: Internal Medicine | Admitting: Internal Medicine

## 2011-11-10 DIAGNOSIS — I2510 Atherosclerotic heart disease of native coronary artery without angina pectoris: Secondary | ICD-10-CM | POA: Insufficient documentation

## 2011-11-10 DIAGNOSIS — R05 Cough: Secondary | ICD-10-CM | POA: Insufficient documentation

## 2011-11-10 DIAGNOSIS — R599 Enlarged lymph nodes, unspecified: Secondary | ICD-10-CM | POA: Insufficient documentation

## 2011-11-10 DIAGNOSIS — R911 Solitary pulmonary nodule: Secondary | ICD-10-CM | POA: Insufficient documentation

## 2011-11-10 DIAGNOSIS — R059 Cough, unspecified: Secondary | ICD-10-CM | POA: Insufficient documentation

## 2011-11-10 DIAGNOSIS — K7689 Other specified diseases of liver: Secondary | ICD-10-CM | POA: Insufficient documentation

## 2011-11-14 ENCOUNTER — Encounter: Payer: Self-pay | Admitting: Cardiology

## 2011-11-14 ENCOUNTER — Encounter: Payer: Self-pay | Admitting: *Deleted

## 2011-11-15 ENCOUNTER — Encounter: Payer: Self-pay | Admitting: Cardiology

## 2011-11-15 ENCOUNTER — Ambulatory Visit (INDEPENDENT_AMBULATORY_CARE_PROVIDER_SITE_OTHER): Payer: 59 | Admitting: Cardiology

## 2011-11-15 VITALS — BP 136/80 | HR 79 | Ht 67.0 in | Wt 327.0 lb

## 2011-11-15 DIAGNOSIS — E785 Hyperlipidemia, unspecified: Secondary | ICD-10-CM

## 2011-11-15 DIAGNOSIS — I10 Essential (primary) hypertension: Secondary | ICD-10-CM

## 2011-11-15 DIAGNOSIS — E669 Obesity, unspecified: Secondary | ICD-10-CM

## 2011-11-15 DIAGNOSIS — I2510 Atherosclerotic heart disease of native coronary artery without angina pectoris: Secondary | ICD-10-CM | POA: Insufficient documentation

## 2011-11-15 MED ORDER — ASPIRIN EC 81 MG PO TBEC: 81.0000 mg | DELAYED_RELEASE_TABLET | Freq: Every day | ORAL | Status: AC

## 2011-11-15 NOTE — Patient Instructions (Addendum)
Your physician wants you to follow-up in: ONE YEAR WITH DR Shelda Pal will receive a reminder letter in the mail two months in advance. If you don't receive a letter, please call our office to schedule the follow-up appointment.   Your physician has requested that you have en exercise stress myoview. For further information please visit [email or web address] Please follow instruction sheet, as given.   START ASPIRIN 81 MG ONCE DAILY WITH FOOD

## 2011-11-15 NOTE — Assessment & Plan Note (Signed)
Patient is noted to have atherosclerosis in his coronary arteries on CT scan. He has dyspnea with more extreme activities and also would like to begin an exercise program for weight loss. I will plan a Myoview for risk stratification. Add enteric-coated aspirin 81 mg daily. Continue statin.

## 2011-11-15 NOTE — Progress Notes (Signed)
HPI: 56 year old male with no prior cardiac history for evaluation of coronary artery disease. Patient recently had a chest CT that showed mild adenopathy and followup recommended in 4-6 months.  There is also a small lesion in his liver and followup recommended. He was also noted to have atherosclerosis in his coronary arteries. Cardiology is therefore asked to further evaluate. Patient has dyspnea with more extreme activities but not routine activities. No orthopnea, PND, exertional chest pain or syncope. Occasional mild pedal edema. Because of the atherosclerosis noted on his CT scan we were asked to evaluate.  Current Outpatient Prescriptions  Medication Sig Dispense Refill  . Albuterol (VENTOLIN IN) Inhale 2 puffs into the lungs 2 (two) times daily.      Marland Kitchen allopurinol (ZYLOPRIM) 100 MG tablet Take 300 mg by mouth daily.       Marland Kitchen ALPRAZolam (XANAX) 0.5 MG tablet Take 0.5 mg by mouth at bedtime as needed.      . ciprofloxacin (CIPRO) 250 MG tablet Take 250 mg by mouth daily.      . colchicine 0.6 MG tablet Take 0.6 mg by mouth 2 (two) times daily.      . Probiotic Product (PROBIOTIC DAILY PO) Take 1 tablet by mouth daily.      . rosuvastatin (CRESTOR) 10 MG tablet Take 10 mg by mouth every morning.       . valsartan-hydrochlorothiazide (DIOVAN-HCT) 320-25 MG per tablet Take 1 tablet by mouth every morning.       . promethazine (PHENERGAN) 25 MG tablet Take 1 tablet (25 mg total) by mouth every 6 (six) hours as needed for nausea.  10 tablet  0  . DISCONTD: promethazine (PHENERGAN) 25 MG tablet Take 1 tablet (25 mg total) by mouth every 6 (six) hours as needed for nausea.  20 tablet  0    No Known Allergies  Past Medical History  Diagnosis Date  . Hypertension   . Right ureteral stone   . Right flank pain DUE TO KIDNEY STONE  . H/O: gout STABLE  . OSA on CPAP   . Adult BMI 50.0-59.9 kg/sq m 52.77  . PU (peptic ulcer)   . GERD (gastroesophageal reflux disease)     Past Surgical History   Procedure Date  . Anal fissure repair 10-12-2000  . Nasal septum surgery 1985  . Left medial femoral condyle debridement & drilling/ removal loose body 09-15-2003  . Stone extraction with basket 06/12/2011    Procedure: STONE EXTRACTION WITH BASKET;  Surgeon: Garnett Farm, MD;  Location: [facility];  Service: Urology;  Laterality: Right;  . Kidney stone removal     06/2011-stent placement    History   Social History  . Marital Status: Married    Spouse Name: N/A    Number of Children: N/A  . Years of Education: N/A   Occupational History  . Not on file.   Social History Main Topics  . Smoking status: Never Smoker   . Smokeless tobacco: Never Used  . Alcohol Use: Yes     rarely  . Drug Use: No     QUIT SMOKING " POT" 40 YRS AGO  . Sexually Active:    Other Topics Concern  . Not on file   Social History Narrative  . No narrative on file    Family History  Problem Relation Age of Onset  . Heart disease Mother     Atrial fibrillation    ROS: recent urinary tract infections following stent  placement and occasional hematochezia but no fevers or chills, productive cough, hemoptysis, dysphasia, odynophagia, melena, hematochezia, dysuria, hematuria, rash, seizure activity, orthopnea, PND, pedal edema, claudication. Remaining systems are negative.  Physical Exam:   Blood pressure 136/80, pulse 79, height 5\' 7"  (1.702 m), weight 148.326 kg (327 lb).  General:  Well developed/morbidly obese in NAD Skin warm/dry Patient not depressed No peripheral clubbing Back-normal HEENT-normal/normal eyelids Neck supple/normal carotid upstroke bilaterally; no bruits; no JVD; no thyromegaly chest - CTA/ normal expansion CV - RRR/normal S1 and S2; no murmurs, rubs or gallops;  PMI nondisplaced Abdomen -NT/ND, no HSM, no mass, + bowel sounds, no bruit 2+ femoral pulses, no bruits Ext- trace edema, no chords, 2+ DP Neuro-grossly nonfocal  ECG sinus rhythm at a rate  of 79. No ST changes.

## 2011-11-15 NOTE — Assessment & Plan Note (Signed)
Patient counseled on weight loss. 

## 2011-11-15 NOTE — Assessment & Plan Note (Signed)
Continue present blood pressure medications. 

## 2011-11-15 NOTE — Assessment & Plan Note (Signed)
Continue statin. 

## 2011-11-27 ENCOUNTER — Ambulatory Visit (HOSPITAL_COMMUNITY): Payer: 59 | Attending: Cardiology | Admitting: Radiology

## 2011-11-27 VITALS — BP 136/69 | Ht 67.0 in | Wt 322.0 lb

## 2011-11-27 DIAGNOSIS — R0602 Shortness of breath: Secondary | ICD-10-CM

## 2011-11-27 DIAGNOSIS — I10 Essential (primary) hypertension: Secondary | ICD-10-CM | POA: Insufficient documentation

## 2011-11-27 DIAGNOSIS — E785 Hyperlipidemia, unspecified: Secondary | ICD-10-CM | POA: Insufficient documentation

## 2011-11-27 DIAGNOSIS — I2510 Atherosclerotic heart disease of native coronary artery without angina pectoris: Secondary | ICD-10-CM | POA: Insufficient documentation

## 2011-11-27 MED ORDER — TECHNETIUM TC 99M TETROFOSMIN IV KIT
30.0000 | PACK | Freq: Once | INTRAVENOUS | Status: AC | PRN
  Administered 2011-11-27: 30 via INTRAVENOUS

## 2011-11-27 NOTE — Progress Notes (Signed)
[facility] SITE 3 NUCLEAR MED [address] [place] [address] [phone]  Cardiology Nuclear Med Study  Todd Rice is a 56 y.o. male     MRN : [id number]     DOB: 1955/08/02  Procedure Date: 11/27/2011  Nuclear Med Background Indication for Stress Test:  Evaluation for Ischemia, and 11-08-11 Recent CT: multiple vessel atherosclerosis in coronary arteries History:  11/08/11 CT: Chest CAD on CT  Cardiac Risk Factors: Hypertension, Lipids and Obesity  Symptoms:  DOE, Palpitations and SOB   Nuclear Pre-Procedure Caffeine/Decaff Intake:  None > 12 hrs NPO After: 8:00pm   Lungs:  clear O2 Sat: 92% on room air. IV 0.9% NS with Angio Cath:  20g  IV Site: R Antecubital x 1, tolerated well IV Started by:  Irean Hong, RN  Chest Size (in):  58 Cup Size: n/a  Height: 5\' 7"  (1.702 m)  Weight:  322 lb (146.058 kg)  BMI:  Body mass index is 50.43 kg/(m^2). Tech Comments:  n/a    Nuclear Med Study 1 or 2 day study: 2 day  Stress Test Type:  Stress  Reading MD: Olga Millers, MD  Order Authorizing Provider:  Olga Millers, MD  Resting Radionuclide: Technetium 49m Tetrofosmin  Resting Radionuclide Dose: 33.0 mCi on 12/06/11   Stress Radionuclide:  Technetium 63m Tetrofosmin  Stress Radionuclide Dose: 33.0 mCi on 11/27/11           Stress Protocol Rest HR: 93 Stress HR: 155  Rest BP: 136/69 Stress BP: 154/60  Exercise Time (min): 3:59 METS: 5.70   Predicted Max HR: 163 bpm % Max HR: 95.09 bpm Rate Pressure Product: 21308   Dose of Adenosine (mg):  n/a Dose of Lexiscan: n/a mg  Dose of Atropine (mg): n/a Dose of Dobutamine: n/a mcg/kg/min (at max HR)  Stress Test Technologist: Milana Na, EMT-P  Nuclear Technologist:  Domenic Polite, CNMT     Rest Procedure:  Myocardial perfusion imaging was performed at rest 45 minutes following the intravenous administration of Technetium 54m Tetrofosmin. Rest ECG: NSR - Normal EKG  Stress Procedure:  The  patient performed treadmill exercise using a Bruce  Protocol for 3:59 minutes. The patient stopped due to fatigue, sob, and denied any chest pain.  There were no significant ST-T wave changes.  Technetium 85m Tetrofosmin was injected at peak exercise and myocardial perfusion imaging was performed after a brief delay. Stress ECG: No significant change from baseline ECG  QPS Raw Data Images:  Normal; no motion artifact; normal heart/lung ratio. Stress Images:  Normal homogeneous uptake in all areas of the myocardium. Rest Images:  Normal homogeneous uptake in all areas of the myocardium. Subtraction (SDS):  Normal Transient Ischemic Dilatation (Normal <1.22):  0.81 Lung/Heart Ratio (Normal <0.45):  0.40  Quantitative Gated Spect Images QGS EDV:  80 ml QGS ESV:  18 ml  Impression Exercise Capacity:  Poor exercise capacity. BP Response:  Normal blood pressure response. Clinical Symptoms:  There is dyspnea. ECG Impression:  No significant ST segment change suggestive of ischemia. Comparison with Prior Nuclear Study: No images to compare  Overall Impression:  Normal stress nuclear study. and With stress there is no EKG change.  LV Ejection Fraction: 77%.  LV Wall Motion:  NL LV Function; NL Wall Motion    [name]

## 2011-12-06 ENCOUNTER — Ambulatory Visit (HOSPITAL_COMMUNITY): Payer: 59 | Attending: Cardiovascular Disease

## 2011-12-06 DIAGNOSIS — R0989 Other specified symptoms and signs involving the circulatory and respiratory systems: Secondary | ICD-10-CM

## 2011-12-06 MED ORDER — TECHNETIUM TC 99M TETROFOSMIN IV KIT
33.0000 | PACK | Freq: Once | INTRAVENOUS | Status: AC | PRN
  Administered 2011-12-06: 33 via INTRAVENOUS

## 2012-02-14 ENCOUNTER — Other Ambulatory Visit (HOSPITAL_COMMUNITY): Payer: Self-pay | Admitting: Internal Medicine

## 2012-02-14 DIAGNOSIS — R911 Solitary pulmonary nodule: Secondary | ICD-10-CM

## 2012-02-14 DIAGNOSIS — R599 Enlarged lymph nodes, unspecified: Secondary | ICD-10-CM

## 2012-03-06 ENCOUNTER — Ambulatory Visit (HOSPITAL_COMMUNITY)
Admission: RE | Admit: 2012-03-06 | Discharge: 2012-03-06 | Disposition: A | Discharge status: Home / Self Care | Payer: 59 | Source: Ambulatory Visit | Attending: Internal Medicine | Admitting: Internal Medicine

## 2012-03-06 DIAGNOSIS — I2510 Atherosclerotic heart disease of native coronary artery without angina pectoris: Secondary | ICD-10-CM | POA: Insufficient documentation

## 2012-03-06 DIAGNOSIS — K7689 Other specified diseases of liver: Secondary | ICD-10-CM | POA: Insufficient documentation

## 2012-03-06 DIAGNOSIS — I700 Atherosclerosis of aorta: Secondary | ICD-10-CM | POA: Insufficient documentation

## 2012-03-06 DIAGNOSIS — R911 Solitary pulmonary nodule: Secondary | ICD-10-CM | POA: Insufficient documentation

## 2012-05-14 ENCOUNTER — Encounter: Payer: Self-pay | Admitting: Internal Medicine

## 2012-05-24 ENCOUNTER — Encounter: Payer: Self-pay | Admitting: *Deleted

## 2012-06-26 ENCOUNTER — Encounter: Payer: Self-pay | Admitting: Internal Medicine

## 2012-06-26 ENCOUNTER — Ambulatory Visit (INDEPENDENT_AMBULATORY_CARE_PROVIDER_SITE_OTHER): Payer: 59 | Admitting: Internal Medicine

## 2012-06-26 VITALS — BP 110/78 | HR 68 | Ht 67.0 in | Wt 320.4 lb

## 2012-06-26 DIAGNOSIS — K921 Melena: Secondary | ICD-10-CM

## 2012-06-26 MED ORDER — MOVIPREP 100 G PO SOLR: 1.0000 | Freq: Once | ORAL | Status: DC

## 2012-06-26 NOTE — Patient Instructions (Addendum)
You have been scheduled for a colonoscopy. Please follow written instructions given to you at your visit today.  Please pick up your prep kit at the pharmacy within the next 1-3 days. If you use inhalers (even only as needed) or a CPAP machine, please bring them with you on the day of your procedure.  CC: Dr Oneta Rack

## 2012-06-26 NOTE — Progress Notes (Signed)
Todd Rice 10/13/1955 MRN [id number]  History of Present Illness:  This is a 57-year-old white male who is here to discuss a colonoscopy. He had a screening colonoscopy in 2003 by Dr. Hayes which was a normal exam. He has occasional low-volume hematochezia which he attributes to straining and constipation. He has a history of an anal fissure which was repaired by Dr. Bowman in 2005. There is no family history of colon cancer. He gives a history of peptic ulcer disease in his 20s which has not recurred. His last hemoglobin was 15.8, hematocrit 45.4. A recent CT scan of the abdomen showed bilateral kidney stones and ureteral stones as well as fatty liver. He has had chronic Pseudomonas urinary tract infection necessitating multiple antibiotics over the past 2 years. He is currently on Mandelamine 500 mg daily and is followed by urology. He just underwent a prostate biopsy several weeks ago which was negative for cancer.    Past Medical History  Diagnosis Date  . Hypertension   . Right ureteral stone   . Right flank pain     DUE TO KIDNEY STONE  . H/O: gout     STABLE  . OSA on CPAP   . Adult BMI 50.0-59.9 kg/sq m     52.77  . PU (peptic ulcer)   . GERD (gastroesophageal reflux disease)   . Hepatic steatosis 10/01/11  . Anal fissure   . Vitamin D deficiency   . Anxiety   . Hyperlipidemia    Past Surgical History  Procedure Laterality Date  . Anal fissure repair  10-12-2000  . Nasal septum surgery  1985  . Left medial femoral condyle debridement & drilling/ removal loose body  09-15-2003  . Stone extraction with basket  06/12/2011    Procedure: STONE EXTRACTION WITH BASKET;  Surgeon: Mark C Ottelin, MD;  Location: [facility];  Service: Urology;  Laterality: Right;  . Kidney stone removal      06/2011-stent placement    reports that he has never smoked. He has never used smokeless tobacco. He reports that  drinks alcohol. He reports that he does not use illicit  drugs. family history includes Heart disease in his mother and Lung cancer in his father. No Known Allergies      Review of Systems: Negative for heartburn dysphagia abdominal pain  The remainder of the 10 point ROS is negative except as outlined in H&P   Physical Exam: General appearance  Well developed, in no distress. Morbidly obese Eyes- non icteric. HEENT nontraumatic, normocephalic. Mouth no lesions, tongue papillated, no cheilosis. Neck supple without adenopathy, thyroid not enlarged, no carotid bruits, no JVD. Lungs Clear to auscultation bilaterally. Cor normal S1, normal S2, regular rhythm, no murmur,  quiet precordium. Abdomen: Large soft abdomen, nontender, normoactive bowel sounds, limited exam due to the abdominal size. Rectal: Noted on. Extremities no pedal edema. Skin no lesions. Neurological alert and oriented x 3. Psychological normal mood and affect.  Assessment and Plan:  Problem #1 57-year-old white male with a BMI of 50.1. He is due for a screening colonoscopy. His last exam was 11 years ago. He has low-volume rectal bleeding which will be evaluated during a colonoscopy. I have discussed the prep with him as well as the sedation which will be either propofol or Versed and fentanyl depending weather it is done in the hospital or in [facility]. He is diabetic so medications will be adjusted. He has not had any problems with the sedation during his   recent surgeries.   06/26/2012 Aizlynn Digilio  

## 2012-06-30 NOTE — Interval H&P Note (Signed)
History and Physical Interval Note:  06/30/2012 9:59 PM  Todd Rice  has presented today for surgery, with the diagnosis of blood in stool  The various methods of treatment have been discussed with the patient and family. After consideration of risks, benefits and other options for treatment, the patient has consented to  Procedure(s): COLONOSCOPY (N/A) as a surgical intervention .  The patient's history has been reviewed, patient examined, no change in status, stable for surgery.  I have reviewed the patient's chart and labs.  Questions were answered to the patient's satisfaction.     Lina Sar

## 2012-06-30 NOTE — H&P (View-Only) (Signed)
Todd Rice 18-Dec-1955 MRN [id number]  History of Present Illness:  This is a 57 year old white male who is here to discuss a colonoscopy. He had a screening colonoscopy in 2003 by Dr. Madilyn Fireman which was a normal exam. He has occasional low-volume hematochezia which he attributes to straining and constipation. He has a history of an anal fissure which was repaired by Dr. Orson Slick in 2005. There is no family history of colon cancer. He gives a history of peptic ulcer disease in his 37s which has not recurred. His last hemoglobin was 15.8, hematocrit 45.4. A recent CT scan of the abdomen showed bilateral kidney stones and ureteral stones as well as fatty liver. He has had chronic Pseudomonas urinary tract infection necessitating multiple antibiotics over the past 2 years. He is currently on Mandelamine 500 mg daily and is followed by urology. He just underwent a prostate biopsy several weeks ago which was negative for cancer.    Past Medical History  Diagnosis Date  . Hypertension   . Right ureteral stone   . Right flank pain     DUE TO KIDNEY STONE  . H/O: gout     STABLE  . OSA on CPAP   . Adult BMI 50.0-59.9 kg/sq m     52.77  . PU (peptic ulcer)   . GERD (gastroesophageal reflux disease)   . Hepatic steatosis 10/01/11  . Anal fissure   . Vitamin D deficiency   . Anxiety   . Hyperlipidemia    Past Surgical History  Procedure Laterality Date  . Anal fissure repair  10-12-2000  . Nasal septum surgery  1985  . Left medial femoral condyle debridement & drilling/ removal loose body  09-15-2003  . Stone extraction with basket  06/12/2011    Procedure: STONE EXTRACTION WITH BASKET;  Surgeon: Garnett Farm, MD;  Location: [facility];  Service: Urology;  Laterality: Right;  . Kidney stone removal      06/2011-stent placement    reports that he has never smoked. He has never used smokeless tobacco. He reports that  drinks alcohol. He reports that he does not use illicit  drugs. family history includes Heart disease in his mother and Lung cancer in his father. No Known Allergies      Review of Systems: Negative for heartburn dysphagia abdominal pain  The remainder of the 10 point ROS is negative except as outlined in H&P   Physical Exam: General appearance  Well developed, in no distress. Morbidly obese Eyes- non icteric. HEENT nontraumatic, normocephalic. Mouth no lesions, tongue papillated, no cheilosis. Neck supple without adenopathy, thyroid not enlarged, no carotid bruits, no JVD. Lungs Clear to auscultation bilaterally. Cor normal S1, normal S2, regular rhythm, no murmur,  quiet precordium. Abdomen: Large soft abdomen, nontender, normoactive bowel sounds, limited exam due to the abdominal size. Rectal: Noted on. Extremities no pedal edema. Skin no lesions. Neurological alert and oriented x 3. Psychological normal mood and affect.  Assessment and Plan:  Problem #15 57 year old white male with a BMI of 50.1. He is due for a screening colonoscopy. His last exam was 11 years ago. He has low-volume rectal bleeding which will be evaluated during a colonoscopy. I have discussed the prep with him as well as the sedation which will be either propofol or Versed and fentanyl depending weather it is done in the hospital or in [facility]. He is diabetic so medications will be adjusted. He has not had any problems with the sedation during his  recent surgeries.   06/26/2012 Lina Sar

## 2012-07-01 ENCOUNTER — Encounter (HOSPITAL_COMMUNITY): Payer: Self-pay | Admitting: *Deleted

## 2012-07-01 ENCOUNTER — Ambulatory Visit (HOSPITAL_COMMUNITY)
Admission: RE | Admit: 2012-07-01 | Discharge: 2012-07-01 | Disposition: A | Discharge status: Home / Self Care | Payer: 59 | Source: Ambulatory Visit | Attending: Internal Medicine | Admitting: Internal Medicine

## 2012-07-01 ENCOUNTER — Encounter (HOSPITAL_COMMUNITY)
Admission: RE | Disposition: A | Discharge status: Home / Self Care | Payer: Self-pay | Source: Ambulatory Visit | Attending: Internal Medicine

## 2012-07-01 DIAGNOSIS — K219 Gastro-esophageal reflux disease without esophagitis: Secondary | ICD-10-CM | POA: Insufficient documentation

## 2012-07-01 DIAGNOSIS — G4733 Obstructive sleep apnea (adult) (pediatric): Secondary | ICD-10-CM | POA: Insufficient documentation

## 2012-07-01 DIAGNOSIS — I10 Essential (primary) hypertension: Secondary | ICD-10-CM | POA: Insufficient documentation

## 2012-07-01 DIAGNOSIS — K625 Hemorrhage of anus and rectum: Secondary | ICD-10-CM | POA: Insufficient documentation

## 2012-07-01 DIAGNOSIS — K921 Melena: Secondary | ICD-10-CM

## 2012-07-01 DIAGNOSIS — K6289 Other specified diseases of anus and rectum: Secondary | ICD-10-CM | POA: Insufficient documentation

## 2012-07-01 DIAGNOSIS — E785 Hyperlipidemia, unspecified: Secondary | ICD-10-CM | POA: Insufficient documentation

## 2012-07-01 DIAGNOSIS — K5730 Diverticulosis of large intestine without perforation or abscess without bleeding: Secondary | ICD-10-CM | POA: Insufficient documentation

## 2012-07-01 DIAGNOSIS — Z1211 Encounter for screening for malignant neoplasm of colon: Secondary | ICD-10-CM

## 2012-07-01 HISTORY — PX: COLONOSCOPY: SHX5424

## 2012-07-01 LAB — GLUCOSE, CAPILLARY: Glucose-Capillary: 129 mg/dL — ABNORMAL HIGH (ref 70–99)

## 2012-07-01 SURGERY — COLONOSCOPY
Anesthesia: Moderate Sedation

## 2012-07-01 MED ORDER — MIDAZOLAM HCL 10 MG/2ML IJ SOLN
INTRAMUSCULAR | Status: AC
  Filled 2012-07-01: qty 2

## 2012-07-01 MED ORDER — SODIUM CHLORIDE 0.9 % IV SOLN
INTRAVENOUS | Status: DC
  Administered 2012-07-01: 500 mL via INTRAVENOUS

## 2012-07-01 MED ORDER — FENTANYL CITRATE 0.05 MG/ML IJ SOLN
INTRAMUSCULAR | Status: AC
  Filled 2012-07-01: qty 2

## 2012-07-01 MED ORDER — FENTANYL CITRATE 0.05 MG/ML IJ SOLN
INTRAMUSCULAR | Status: DC | PRN
  Administered 2012-07-01 (×4): 25 ug via INTRAVENOUS

## 2012-07-01 MED ORDER — HYDROCORTISONE ACE-PRAMOXINE 2.5-1 % RE CREA: TOPICAL_CREAM | Freq: Three times a day (TID) | RECTAL | Status: DC

## 2012-07-01 MED ORDER — MIDAZOLAM HCL 5 MG/5ML IJ SOLN
INTRAMUSCULAR | Status: DC | PRN
  Administered 2012-07-01 (×4): 2 mg via INTRAVENOUS

## 2012-07-01 NOTE — Interval H&P Note (Signed)
History and Physical Interval Note:  07/01/2012 8:34 AM  Todd Rice  has presented today for surgery, with the diagnosis of blood in stool  The various methods of treatment have been discussed with the patient and family. After consideration of risks, benefits and other options for treatment, the patient has consented to  Procedure(s): COLONOSCOPY (N/A) as a surgical intervention .  The patient's history has been reviewed, patient examined, no change in status, stable for surgery.  I have reviewed the patient's chart and labs.  Questions were answered to the patient's satisfaction.     Lina Sar

## 2012-07-01 NOTE — Op Note (Signed)
[facility] [address] [place], [address]   COLONOSCOPY PROCEDURE REPORT  PATIENT: Todd Rice, Todd Rice  MR#: [id number] BIRTHDATE: March 23, 1956 , 57  yrs. old GENDER: Male ENDOSCOPIST: Hart Carwin, MD REFERRED BY:  Lucky Cowboy, M.D. PROCEDURE DATE:  07/01/2012 PROCEDURE:   Colonoscopy, screening ASA CLASS:   Class III INDICATIONS:Average risk patient for colon cancer and last colon 2003, low volume rectal bleeding.  Hgb 15.8 MEDICATIONS: These medications were titrated to patient response per physician's verbal order, Versed 8 mg IV, and Fentanyl 100 mcg IV   DESCRIPTION OF PROCEDURE:   After the risks and benefits and of the procedure were explained, informed consent was obtained.  A digital rectal exam revealed no abnormalities of the rectum.    The Pentax Ped Colon [id number]  endoscope was introduced through the anus and advanced to the cecum, which was identified by both the appendix and ileocecal valve .  The quality of the prep was excellent, using MoviPrep .  The instrument was then slowly withdrawn as the colon was fully examined.     COLON FINDINGS: Mild diverticulosis was noted. in the sigmoid colon, there were circumferential anal erosions which  bled slighly as the scope moved back and forth through the anal canal   Retroflexed views revealed no abnormalities.     The scope was then withdrawn from the patient and the procedure completed.  COMPLICATIONS: There were no complications. ENDOSCOPIC IMPRESSION: Mild diverticulosis was noted anal erosions, likely source of bleeding  RECOMMENDATIONS:  high fiber diet ANALPRAM 2.5 % PRN RECTAL BLEEDING   REPEAT EXAM: RECALL COLON 10 YEARS  cc:  _______________________________ eSignedHart Carwin, MD 07/01/2012 10:07 AM     PATIENT NAME:  Todd Rice, Todd Rice MR#: [id number]

## 2012-07-02 ENCOUNTER — Encounter (HOSPITAL_COMMUNITY): Payer: Self-pay | Admitting: Internal Medicine

## 2012-10-08 ENCOUNTER — Ambulatory Visit (INDEPENDENT_AMBULATORY_CARE_PROVIDER_SITE_OTHER): Payer: 59 | Admitting: Internal Medicine

## 2012-10-08 ENCOUNTER — Encounter: Payer: Self-pay | Admitting: Internal Medicine

## 2012-10-08 VITALS — BP 145/82 | HR 91 | Temp 98.0°F | Wt 325.0 lb

## 2012-10-08 DIAGNOSIS — Z2239 Carrier of other specified bacterial diseases: Secondary | ICD-10-CM

## 2012-10-08 NOTE — Progress Notes (Signed)
  Subjective:    Patient ID: Todd Rice, male    DOB: 1955-09-10, 57 y.o.   MRN: [id number]  HPI He comes in for new patient evaluation. He has a history of ureteral stones in the had a stent placed for short time about a year and a half ago and does have several small stones noted on a CT scan that are nonobstructive and comes in here for evaluation for recurrent UTIs. He tells me his symptoms generally are left flank pain though he also notes that he has had flank pain recently and goes away on its own without antibiotic treatment typically he does take antibiotics for flank pain with concern for urinary tract infection however he does not develop other symptoms including no fever, chills or elevated WBC. He is taking Mandelamine daily for prophylaxis of urinary tract infections which she has been doing for about 6 months. His last urine culture that I have was from January 2014 and was negative. His previous cultures have been Pseudomonas though none are available to me at this time. Last year, he did have urinary tract infection after a prostate biopsy. At that time he did have a high fever. He had a prostate biopsy to an increase in his PSA. He comes in at his request to find out if other things can be done for his colonization or recurrent infections.   Review of Systems  Constitutional: Positive for fatigue. Negative for fever, appetite change and unexpected weight change.  Respiratory: Negative for cough and shortness of breath.   Cardiovascular: Negative for leg swelling.  Gastrointestinal: Negative for nausea, abdominal pain and diarrhea.  Genitourinary: Negative for dysuria, urgency, frequency, hematuria, decreased urine volume and difficulty urinating.  Musculoskeletal: Negative for myalgias and arthralgias.  Skin: Negative for rash.  Neurological: Negative for dizziness and headaches.       Objective:   Physical Exam  Constitutional: He appears well-developed and  well-nourished. No distress.  Eyes: No scleral icterus.  Cardiovascular: Normal rate, regular rhythm and normal heart sounds.   No murmur heard. Abdominal:  Obese  Skin: No rash noted.  Psychiatric: He has a normal mood and affect. His behavior is normal.          Assessment & Plan:

## 2012-10-08 NOTE — Assessment & Plan Note (Addendum)
Though I don't have any positive cultures he does have a history of Pseudomonas in his urine. He did have one urinary tract infection last year after his prostate biopsy however his other infections to me are unimpressive. They really were a only associated with flank pain according to him and did not involve fever, chills or increased white count. I therefore had a long discussion with him in regards to the different approaches to prevention of urinary tract infections. I did first explain that I feel that a true urinary tract infection or pyelonephritis in these cases is discriminated from colonization by a more impressive symptoms including fever or an elevated WBC count. He does tell me that his flank pain does come and go regardless of antibiotics and therefore I suspect it is unrelated to urinary tract infection. I also explained the concern with MI at resistance for him and that he certainly is at increased risk for urinary tract infections having colonization and potential he do 2 the chronic stones that are in his bladder however studies do indicate that chronic suppressive therapy did not offer any benefit and potentially lead to more resistance. Therefore he is going to call here when he starts to develop symptoms that he feels is concerning for urinary tract infection and are more than flank pain. That way he can be managed here for infections if needed or by his primary physician.  I have advised he stop his Mathenamine and he is in agreement with plan.   He will return on a p.r.n. Basis when symptomatic

## 2012-10-22 ENCOUNTER — Telehealth: Payer: Self-pay | Admitting: *Deleted

## 2012-10-22 NOTE — Telephone Encounter (Signed)
Office notes routed to Dr. Kathryne Sharper office per request.

## 2012-11-12 ENCOUNTER — Other Ambulatory Visit (HOSPITAL_COMMUNITY): Payer: Self-pay | Admitting: Internal Medicine

## 2012-11-12 DIAGNOSIS — Z87442 Personal history of urinary calculi: Secondary | ICD-10-CM

## 2012-11-12 DIAGNOSIS — R7989 Other specified abnormal findings of blood chemistry: Secondary | ICD-10-CM

## 2012-11-12 DIAGNOSIS — K760 Fatty (change of) liver, not elsewhere classified: Secondary | ICD-10-CM

## 2012-11-15 ENCOUNTER — Other Ambulatory Visit (HOSPITAL_COMMUNITY): Payer: Self-pay | Admitting: Internal Medicine

## 2012-11-15 ENCOUNTER — Encounter (HOSPITAL_COMMUNITY): Payer: Self-pay

## 2012-11-15 ENCOUNTER — Ambulatory Visit (HOSPITAL_COMMUNITY)
Admission: RE | Admit: 2012-11-15 | Discharge: 2012-11-15 | Disposition: A | Discharge status: Home / Self Care | Payer: 59 | Source: Ambulatory Visit | Attending: Internal Medicine | Admitting: Internal Medicine

## 2012-11-15 DIAGNOSIS — R7989 Other specified abnormal findings of blood chemistry: Secondary | ICD-10-CM

## 2012-11-15 DIAGNOSIS — R911 Solitary pulmonary nodule: Secondary | ICD-10-CM | POA: Insufficient documentation

## 2012-11-15 DIAGNOSIS — Z87442 Personal history of urinary calculi: Secondary | ICD-10-CM

## 2012-11-15 DIAGNOSIS — R160 Hepatomegaly, not elsewhere classified: Secondary | ICD-10-CM | POA: Insufficient documentation

## 2012-11-15 DIAGNOSIS — K7689 Other specified diseases of liver: Secondary | ICD-10-CM | POA: Insufficient documentation

## 2012-11-15 DIAGNOSIS — K760 Fatty (change of) liver, not elsewhere classified: Secondary | ICD-10-CM

## 2012-11-15 DIAGNOSIS — N200 Calculus of kidney: Secondary | ICD-10-CM | POA: Insufficient documentation

## 2012-11-15 DIAGNOSIS — N289 Disorder of kidney and ureter, unspecified: Secondary | ICD-10-CM | POA: Insufficient documentation

## 2012-11-15 MED ORDER — IOHEXOL 300 MG/ML  SOLN
125.0000 mL | Freq: Once | INTRAMUSCULAR | Status: AC | PRN
  Administered 2012-11-15: 125 mL via INTRAVENOUS

## 2012-11-18 ENCOUNTER — Other Ambulatory Visit: Payer: Self-pay | Admitting: Internal Medicine

## 2012-11-18 DIAGNOSIS — N289 Disorder of kidney and ureter, unspecified: Secondary | ICD-10-CM

## 2012-11-22 ENCOUNTER — Ambulatory Visit
Admission: RE | Admit: 2012-11-22 | Discharge: 2012-11-22 | Disposition: A | Discharge status: Home / Self Care | Payer: 59 | Source: Ambulatory Visit | Attending: Internal Medicine | Admitting: Internal Medicine

## 2012-11-22 DIAGNOSIS — N289 Disorder of kidney and ureter, unspecified: Secondary | ICD-10-CM

## 2012-11-22 MED ORDER — GADOBENATE DIMEGLUMINE 529 MG/ML IV SOLN
20.0000 mL | Freq: Once | INTRAVENOUS | Status: AC | PRN
  Administered 2012-11-22: 20 mL via INTRAVENOUS

## 2012-11-24 ENCOUNTER — Other Ambulatory Visit: Payer: 59

## 2013-01-16 ENCOUNTER — Other Ambulatory Visit: Payer: Self-pay | Admitting: Urology

## 2013-02-21 ENCOUNTER — Other Ambulatory Visit (HOSPITAL_COMMUNITY): Payer: Self-pay | Admitting: *Deleted

## 2013-02-24 ENCOUNTER — Encounter (HOSPITAL_COMMUNITY)
Admission: RE | Admit: 2013-02-24 | Discharge: 2013-02-24 | Disposition: A | Discharge status: Home / Self Care | Payer: 59 | Source: Ambulatory Visit | Attending: Urology | Admitting: Urology

## 2013-02-24 ENCOUNTER — Ambulatory Visit (HOSPITAL_COMMUNITY)
Admission: RE | Admit: 2013-02-24 | Discharge: 2013-02-24 | Disposition: A | Discharge status: Home / Self Care | Payer: 59 | Source: Ambulatory Visit | Attending: Urology | Admitting: Urology

## 2013-02-24 ENCOUNTER — Encounter (HOSPITAL_COMMUNITY): Payer: Self-pay | Admitting: Pharmacy Technician

## 2013-02-24 ENCOUNTER — Encounter (HOSPITAL_COMMUNITY): Payer: Self-pay

## 2013-02-24 DIAGNOSIS — N390 Urinary tract infection, site not specified: Secondary | ICD-10-CM | POA: Insufficient documentation

## 2013-02-24 DIAGNOSIS — N200 Calculus of kidney: Secondary | ICD-10-CM | POA: Insufficient documentation

## 2013-02-24 DIAGNOSIS — Z01818 Encounter for other preprocedural examination: Secondary | ICD-10-CM | POA: Insufficient documentation

## 2013-02-24 DIAGNOSIS — Z01812 Encounter for preprocedural laboratory examination: Secondary | ICD-10-CM | POA: Insufficient documentation

## 2013-02-24 HISTORY — DX: Neoplasm of unspecified behavior of unspecified kidney: D49.519

## 2013-02-24 HISTORY — DX: Personal history of urinary calculi: Z87.442

## 2013-02-24 HISTORY — DX: Type 2 diabetes mellitus without complications: E11.9

## 2013-02-24 LAB — CBC
HCT: 43.2 % (ref 39.0–52.0)
Hemoglobin: 15.3 g/dL (ref 13.0–17.0)
MCV: 84.2 fL (ref 78.0–100.0)
RBC: 5.13 MIL/uL (ref 4.22–5.81)
RDW: 13.9 % (ref 11.5–15.5)
WBC: 8.2 10*3/uL (ref 4.0–10.5)

## 2013-02-24 LAB — BASIC METABOLIC PANEL
CO2: 25 mEq/L (ref 19–32)
Chloride: 101 mEq/L (ref 96–112)
GFR calc Af Amer: 82 mL/min — ABNORMAL LOW (ref >90)
GFR calc non Af Amer: 71 mL/min — ABNORMAL LOW (ref >90)
Potassium: 3.9 mEq/L (ref 3.5–5.1)
Sodium: 137 mEq/L (ref 135–145)

## 2013-02-24 NOTE — Patient Instructions (Addendum)
20 OMARIAN JAQUITH  02/24/2013   Your procedure is scheduled on: 02/26/13  Report to [facility] at 6:00 AM.  Call this number if you have problems the morning of surgery [phone]: [phone]   Remember: please bring inhaler on the day of surgery   Do not eat food or drink liquids After Midnight.     Take these medicines the morning of surgery with A SIP OF WATER: xanax if needed, cipro   Do not wear jewelry, make-up or nail polish.  Do not wear lotions, powders, or perfumes. You may wear deodorant.  Do not shave 48 hours prior to surgery. Men may shave face and neck.  Do not bring valuables to the hospital.  Contacts, dentures or bridgework may not be worn into surgery.   Patients discharged the day of surgery will not be allowed to drive home.  Name and phone number of your driver: [name] [phone]  Birdie Sons, RN  pre op nurse call if needed [phone]    FAILURE TO FOLLOW THESE INSTRUCTIONS MAY RESULT IN CANCELLATION OF YOUR SURGERY   Patient Signature: ___________________________________________

## 2013-02-24 NOTE — Progress Notes (Signed)
EKG 11/04/12 on chart

## 2013-02-25 ENCOUNTER — Encounter (HOSPITAL_COMMUNITY): Payer: Self-pay | Admitting: Anesthesiology

## 2013-02-25 NOTE — Anesthesia Preprocedure Evaluation (Addendum)
Anesthesia Evaluation  Patient identified by MRN, date of birth, ID band Patient awake    Reviewed: Allergy & Precautions, H&P , NPO status , Patient's Chart, lab work & pertinent test results  Airway Mallampati: II TM Distance: >3 FB Neck ROM: Full    Dental no notable dental hx.    Pulmonary sleep apnea ,  CXR: No acute cardiopulmonary process. breath sounds clear to auscultation  Pulmonary exam normal       Cardiovascular Exercise Tolerance: Good hypertension, Pt. on medications + CAD negative cardio ROS  Rhythm:Regular Rate:Normal  ECG 11-15-11: normal.   Neuro/Psych Anxiety negative neurological ROS     GI/Hepatic Neg liver ROS, GERD-  ,  Endo/Other  diabetes, Type 2, Oral Hypoglycemic AgentsMorbid obesity  Renal/GU Renal disease  negative genitourinary   Musculoskeletal negative musculoskeletal ROS (+)   Abdominal (+) + obese,   Peds negative pediatric ROS (+)  Hematology negative hematology ROS (+)   Anesthesia Other Findings   Reproductive/Obstetrics negative OB ROS                          Anesthesia Physical Anesthesia Plan  ASA: III  Anesthesia Plan: General   Post-op Pain Management:    Induction: Intravenous  Airway Management Planned: LMA  Additional Equipment:   Intra-op Plan:   Post-operative Plan: Extubation in OR  Informed Consent: I have reviewed the patients History and Physical, chart, labs and discussed the procedure including the risks, benefits and alternatives for the proposed anesthesia with the patient or authorized representative who has indicated his/her understanding and acceptance.   Dental advisory given  Plan Discussed with: CRNA  Anesthesia Plan Comments: (LMA #5 2013)        Anesthesia Quick Evaluation

## 2013-02-26 ENCOUNTER — Encounter (HOSPITAL_COMMUNITY): Payer: Self-pay | Admitting: *Deleted

## 2013-02-26 ENCOUNTER — Ambulatory Visit (HOSPITAL_COMMUNITY)
Admission: RE | Admit: 2013-02-26 | Discharge: 2013-02-26 | Disposition: A | Discharge status: Home / Self Care | Payer: 59 | Source: Ambulatory Visit | Attending: Urology | Admitting: Urology

## 2013-02-26 ENCOUNTER — Encounter (HOSPITAL_COMMUNITY): Payer: 59 | Admitting: Anesthesiology

## 2013-02-26 ENCOUNTER — Ambulatory Visit (HOSPITAL_COMMUNITY): Payer: 59 | Admitting: Anesthesiology

## 2013-02-26 ENCOUNTER — Encounter (HOSPITAL_COMMUNITY)
Admission: RE | Disposition: A | Discharge status: Home / Self Care | Payer: Self-pay | Source: Ambulatory Visit | Attending: Urology

## 2013-02-26 DIAGNOSIS — N390 Urinary tract infection, site not specified: Secondary | ICD-10-CM | POA: Insufficient documentation

## 2013-02-26 DIAGNOSIS — F411 Generalized anxiety disorder: Secondary | ICD-10-CM | POA: Insufficient documentation

## 2013-02-26 DIAGNOSIS — E785 Hyperlipidemia, unspecified: Secondary | ICD-10-CM | POA: Insufficient documentation

## 2013-02-26 DIAGNOSIS — K7689 Other specified diseases of liver: Secondary | ICD-10-CM | POA: Insufficient documentation

## 2013-02-26 DIAGNOSIS — B965 Pseudomonas (aeruginosa) (mallei) (pseudomallei) as the cause of diseases classified elsewhere: Secondary | ICD-10-CM | POA: Insufficient documentation

## 2013-02-26 DIAGNOSIS — G4733 Obstructive sleep apnea (adult) (pediatric): Secondary | ICD-10-CM | POA: Insufficient documentation

## 2013-02-26 DIAGNOSIS — M109 Gout, unspecified: Secondary | ICD-10-CM | POA: Insufficient documentation

## 2013-02-26 DIAGNOSIS — I2510 Atherosclerotic heart disease of native coronary artery without angina pectoris: Secondary | ICD-10-CM | POA: Insufficient documentation

## 2013-02-26 DIAGNOSIS — D4959 Neoplasm of unspecified behavior of other genitourinary organ: Secondary | ICD-10-CM | POA: Insufficient documentation

## 2013-02-26 DIAGNOSIS — Z6841 Body Mass Index (BMI) 40.0 and over, adult: Secondary | ICD-10-CM | POA: Insufficient documentation

## 2013-02-26 DIAGNOSIS — Z79899 Other long term (current) drug therapy: Secondary | ICD-10-CM | POA: Insufficient documentation

## 2013-02-26 DIAGNOSIS — K219 Gastro-esophageal reflux disease without esophagitis: Secondary | ICD-10-CM | POA: Insufficient documentation

## 2013-02-26 DIAGNOSIS — N200 Calculus of kidney: Secondary | ICD-10-CM | POA: Insufficient documentation

## 2013-02-26 DIAGNOSIS — Z87440 Personal history of urinary (tract) infections: Secondary | ICD-10-CM | POA: Insufficient documentation

## 2013-02-26 DIAGNOSIS — Z7982 Long term (current) use of aspirin: Secondary | ICD-10-CM | POA: Insufficient documentation

## 2013-02-26 DIAGNOSIS — I10 Essential (primary) hypertension: Secondary | ICD-10-CM | POA: Insufficient documentation

## 2013-02-26 HISTORY — PX: CYSTOSCOPY WITH RETROGRADE PYELOGRAM, URETEROSCOPY AND STENT PLACEMENT: SHX5789

## 2013-02-26 HISTORY — PX: HOLMIUM LASER APPLICATION: SHX5852

## 2013-02-26 LAB — GLUCOSE, CAPILLARY
Glucose-Capillary: 143 mg/dL — ABNORMAL HIGH (ref 70–99)
Glucose-Capillary: 131 mg/dL — ABNORMAL HIGH (ref 70–99)

## 2013-02-26 SURGERY — CYSTOURETEROSCOPY, WITH RETROGRADE PYELOGRAM AND STENT INSERTION
Anesthesia: General | Laterality: Bilateral | Wound class: Clean Contaminated

## 2013-02-26 MED ORDER — ONDANSETRON HCL 4 MG/2ML IJ SOLN
INTRAMUSCULAR | Status: DC | PRN
  Administered 2013-02-26 (×2): 2 mg via INTRAMUSCULAR

## 2013-02-26 MED ORDER — MIDAZOLAM HCL 5 MG/5ML IJ SOLN
INTRAMUSCULAR | Status: DC | PRN
  Administered 2013-02-26 (×2): .5 mg via INTRAVENOUS

## 2013-02-26 MED ORDER — FENTANYL CITRATE 0.05 MG/ML IJ SOLN: 25.0000 ug | INTRAMUSCULAR | Status: DC | PRN

## 2013-02-26 MED ORDER — PROPOFOL 10 MG/ML IV BOLUS
INTRAVENOUS | Status: DC | PRN
  Administered 2013-02-26: 25 mg via INTRAVENOUS
  Administered 2013-02-26: 200 mg via INTRAVENOUS

## 2013-02-26 MED ORDER — CIPROFLOXACIN HCL 500 MG PO TABS: 500.0000 mg | ORAL_TABLET | Freq: Two times a day (BID) | ORAL | Status: DC

## 2013-02-26 MED ORDER — IOHEXOL 300 MG/ML  SOLN
INTRAMUSCULAR | Status: DC | PRN
  Administered 2013-02-26: 33 mL

## 2013-02-26 MED ORDER — PHENYLEPHRINE HCL 10 MG/ML IJ SOLN
INTRAMUSCULAR | Status: DC | PRN
  Administered 2013-02-26 (×3): 40 ug via INTRAVENOUS

## 2013-02-26 MED ORDER — OXYBUTYNIN CHLORIDE 5 MG PO TABS: 5.0000 mg | ORAL_TABLET | Freq: Three times a day (TID) | ORAL | Status: DC | PRN

## 2013-02-26 MED ORDER — PIPERACILLIN-TAZOBACTAM 3.375 G IVPB 30 MIN
3.3750 g | Freq: Once | INTRAVENOUS | Status: AC
  Administered 2013-02-26: 3.375 g via INTRAVENOUS
  Filled 2013-02-26: qty 50

## 2013-02-26 MED ORDER — LIDOCAINE HCL (CARDIAC) 20 MG/ML IV SOLN
INTRAVENOUS | Status: DC | PRN
  Administered 2013-02-26: 75 mg via INTRAVENOUS

## 2013-02-26 MED ORDER — LIDOCAINE HCL 2 % EX GEL
CUTANEOUS | Status: AC
  Filled 2013-02-26: qty 10

## 2013-02-26 MED ORDER — 0.9 % SODIUM CHLORIDE (POUR BTL) OPTIME
TOPICAL | Status: DC | PRN
  Administered 2013-02-26: 1000 mL

## 2013-02-26 MED ORDER — PROMETHAZINE HCL 25 MG/ML IJ SOLN: 6.2500 mg | INTRAMUSCULAR | Status: DC | PRN

## 2013-02-26 MED ORDER — SENNOSIDES-DOCUSATE SODIUM 8.6-50 MG PO TABS: 1.0000 | ORAL_TABLET | Freq: Two times a day (BID) | ORAL | Status: DC

## 2013-02-26 MED ORDER — SODIUM CHLORIDE 0.9 % IR SOLN
Status: DC | PRN
  Administered 2013-02-26: 5000 mL via INTRAVESICAL

## 2013-02-26 MED ORDER — LACTATED RINGERS IV SOLN
INTRAVENOUS | Status: DC
  Administered 2013-02-26: 10:00:00 via INTRAVENOUS
  Administered 2013-02-26: 1000 mL via INTRAVENOUS

## 2013-02-26 MED ORDER — FENTANYL CITRATE 0.05 MG/ML IJ SOLN
INTRAMUSCULAR | Status: DC | PRN
  Administered 2013-02-26 (×5): 50 ug via INTRAVENOUS

## 2013-02-26 MED ORDER — OXYCODONE-ACETAMINOPHEN 5-325 MG PO TABS: 1.0000 | ORAL_TABLET | ORAL | Status: DC | PRN

## 2013-02-26 MED ORDER — METOCLOPRAMIDE HCL 5 MG/ML IJ SOLN
INTRAMUSCULAR | Status: DC | PRN
  Administered 2013-02-26: 5 mg via INTRAVENOUS

## 2013-02-26 SURGICAL SUPPLY — 23 items
BAG URINE DRAINAGE (UROLOGICAL SUPPLIES) ×1 IMPLANT
BASKET LASER NITINOL 1.9FR (BASKET) ×3 IMPLANT
BASKET STNLS GEMINI 4WIRE 3FR (BASKET) IMPLANT
BASKET ZERO TIP NITINOL 2.4FR (BASKET) IMPLANT
BSKT STON RTRVL 120 1.9FR (BASKET) ×3
BSKT STON RTRVL GEM 120X11 3FR (BASKET)
BSKT STON RTRVL ZERO TP 2.4FR (BASKET)
CATH INTERMIT  6FR 70CM (CATHETERS) ×2 IMPLANT
DRAPE CAMERA CLOSED 9X96 (DRAPES) ×2 IMPLANT
ELECT REM PT RETURN 9FT ADLT (ELECTROSURGICAL)
ELECTRODE REM PT RTRN 9FT ADLT (ELECTROSURGICAL) IMPLANT
FIBER LASER FLEXIVA 200 (UROLOGICAL SUPPLIES) ×1 IMPLANT
FIBER LASER FLEXIVA 365 (UROLOGICAL SUPPLIES) IMPLANT
GLOVE BIOGEL M STRL SZ7.5 (GLOVE) ×2 IMPLANT
GOWN PREVENTION PLUS LG XLONG (DISPOSABLE) ×2 IMPLANT
GUIDEWIRE ANG ZIPWIRE 038X150 (WIRE) ×2 IMPLANT
GUIDEWIRE STR DUAL SENSOR (WIRE) ×2 IMPLANT
IV NS IRRIG 3000ML ARTHROMATIC (IV SOLUTION) ×1 IMPLANT
PACK CYSTO (CUSTOM PROCEDURE TRAY) ×2 IMPLANT
STENT POLARIS 5FRX26 (STENTS) ×2 IMPLANT
SYRINGE 10CC LL (SYRINGE) IMPLANT
SYRINGE IRR TOOMEY STRL 70CC (SYRINGE) IMPLANT
TUBE FEEDING 8FR 16IN STR KANG (MISCELLANEOUS) ×2 IMPLANT

## 2013-02-26 NOTE — Anesthesia Procedure Notes (Signed)
Procedure Name: LMA Insertion Date/Time: 02/26/2013 8:33 AM Performed by: Edison Pace Pre-anesthesia Checklist: Emergency Drugs available, Patient identified, Timeout performed, Suction available and Patient being monitored Patient Re-evaluated:Patient Re-evaluated prior to inductionOxygen Delivery Method: Circle system utilized Preoxygenation: Pre-oxygenation with 100% oxygen Intubation Type: IV induction LMA: LMA with gastric port inserted LMA Size: 5.0 Number of attempts: 1 Placement Confirmation: positive ETCO2 Tube secured with: Tape Dental Injury: Teeth and Oropharynx as per pre-operative assessment

## 2013-02-26 NOTE — Brief Op Note (Signed)
02/26/2013  10:15 AM  PATIENT:  Todd Rice  57 y.o. male  PRE-OPERATIVE DIAGNOSIS:  BILATERAL RENAL STONES RECURRENT URINARY TRACT INFECTION  POST-OPERATIVE DIAGNOSIS:  BILATERAL RENAL STONES RECURRENT URINARY TRACT   PROCEDURE:  Procedure(s): CYSTOSCOPY WITH RETROGRADE PYELOGRAM, URETEROSCOPY AND STENT PLACEMENT (Bilateral) HOLMIUM LASER APPLICATION (Bilateral)  SURGEON:  Surgeon(s) and Role:    * Sebastian Ache, MD - Primary  PHYSICIAN ASSISTANT:   ASSISTANTS: none   ANESTHESIA:   general  EBL:  Total I/O In: 1000 [I.V.:1000] Out: -   BLOOD ADMINISTERED:none  DRAINS: none   LOCAL MEDICATIONS USED:  NONE  SPECIMEN:  Source of Specimen:  Rt and Lt Renal Stones  DISPOSITION OF SPECIMEN:  [facility] for Compositional Analysis  COUNTS:  YES  TOURNIQUET:  * No tourniquets in log *  DICTATION: .Other Dictation: Dictation Number [id number]  PLAN OF CARE: Discharge to home after PACU  PATIENT DISPOSITION:  PACU - hemodynamically stable.   Delay start of Pharmacological VTE agent (>24hrs) due to surgical blood loss or risk of bleeding: yes

## 2013-02-26 NOTE — Preoperative (Addendum)
Beta Blockers   Reason not to administer Beta Blockers:Not Applicable 

## 2013-02-26 NOTE — Op Note (Signed)
NAMELENIS, NETTLETON NO.:  [id number]  MEDICAL RECORD NO.:  [id number]  LOCATION:  [facility]                         FACILITY:  [facility]  PHYSICIAN:  Sebastian Ache, MD     DATE OF BIRTH:  Aug 15, 1955  DATE OF PROCEDURE: 02/26/2013 DATE OF DISCHARGE:                              OPERATIVE REPORT   DIAGNOSES: 1. Recurrent pseudomonal urinary tract infection. 2. Bilateral nephrolithiasis.  PROCEDURE: 1. Cystoscopy with bilateral retrograde pyelogram and interpretation. 2. Right ureteroscopy with laser lithotripsy. 3. Insertion of bilateral ureteral stents, 5 x 26 Polaris, no tether. 4. Left ureteroscopy with basketing of stones  ESTIMATED BLOOD LOSS:  Nil.  COMPLICATIONS:  None.  SPECIMEN:  Bilateral Renal stone fragments for compositional analysis.  FINDINGS:  Multifocal bilateral small renal stones, largest being right lower pole, approximately 6 mm.  INDICATION:  Mr. Homan is a very pleasant 57 year old gentleman with history of obesity, small renal mass, recurrent nephrolithiasis, and recurrent pseudomonal urinary tract infection.  He was found on evaluation to have bilateral nephrolithiasis without hydronephrosis.  He empties his bladder well and felt that his current stones could possibly represent recurrent nidus of infections and options were discussed for management including continued chronic suppressive antibiotics versus trial of ureteroscopic stone manipulation with goal of stone free and he wished to proceed with the latter.  Informed consent was obtained and placed in medical record.  PROCEDURE IN DETAIL:  The patient being Suhan Paci, was verified. The procedure being bilateral ureteroscopic stone manipulation was confirmed.  Procedure was carried out.  Time-out was performed. Intravenous antibiotics administered.  General LMA anesthesia was introduced.  The patient was placed into a low lithotomy position. Sterile field was created by  prepping and draping the patient's penis, perineum, and proximal thighs using iodine x3.  Next, cystourethroscopy was performed using a 22-French rigid scope with 12-degree offset lens. Inspection of the anterior and posterior urethra were unremarkable. Inspection of urinary bladder revealed no diverticula, calcifications, papular lesions.  The right ureter orifice was cannulated with a 6- French end-hole catheter and right retrograde pyelogram obtained.  Right retrograde pyelogram demonstrated a single right ureter with single system right kidney.  No filling defects or narrowing noted.  A 0.038 Glidewire was advanced at the level of the upper pole and set aside as a safety wire.  Next, semi-rigid ureteroscopy was performed of the distal two-thirds of the right ureter alongside a separate Sensor working wire.  An 8-French feeding tube in the urinary bladder for pressure release.  This revealed no mucosal abnormalities or calcifications to the distal right ureter.  The semi-rigid ureteroscope was then exchanged for the 12/14 38-cm ureteral access sheath over the sensor working wire to the level of the proximal ureter using continuous fluoroscopic vision.  Next, flexible digital ureteroscopy was performed of the proximal ureter and right kidney using 6-French flexible digital ureteroscope.  This revealed multifocal nephrolithiasis in the right kidney without hydronephrosis.  The largest fragment was a lower pole stone that appeared to be approximately 6 mm.  This appeared to be much too large for simple basketing.  As such, it was repositioned to the area of the UPJ and holmium laser energy  was applied to the stone using settings of 0.6 joules and 6 hertz.  The stone was fragmented in approximately 4 smaller pieces.  These were then grasped sequentially with an escape basket, brought out their entirety, set aside for compositional analysis.  Several smaller stones in the mid pole  were amenable to basketing and removed.  Systematic inspection following these maneuvers revealed complete removal of all stone fragments larger than 1/3rd mm.  There was excellent hemostasis.  No evidence of perforation.  The access sheath was removed under continuous ureteroscopic vision and no mucosal abnormalities were found along the entire length of the right ureter.  Finally, a new 5 x 26 Polaris stent was placed using cystoscopic and fluoroscopic guidance over the remaining safety wire.  Good proximal and distal deployment were noted. Attention was then directed to the left side.  The left ureter was cannulated with a 6-French end-hole catheter and left retrograde pyelogram obtained.  Left retrograde pyelogram demonstrated a single left ureter, single system left kidney.  There is mild J hooking distally.  No filling defects or narrowing noted.  A 0.038 Glidewire was advanced at the level of the upper pole and set aside as a safety wire.  The feeding tube was once again placed in urinary bladder for pressure release.  Next, semi- rigid ureteroscopy was performed of the distal 2/3rd of left ureter alongside a separate Sensor working wire.  No mucosal abnormalities or calcifications were encountered.  The semi-rigid ureteroscope was exchanged for the 12/14 38-cm ureteral access sheath, which was placed over the sensor working wire to the level of proximal ureter.  Flexible digital ureteroscopy was then performed to allow inspection of the entire proximal left ureter and left kidney.  This revealed multifocal small stones that appeared to be amenable to simple basketing.  As such, escape basket was once again used to remove the stones that were set aside for compositional analysis.  Final inspection revealed complete removal of all stones larger than 1/3rd mm and no evidence of perforation.  There was excellent hemostasis.  The access sheath was removed on the left side with  continuous ureteroscopic vision and no mucosal abnormalities were found.  Finally, a new 5 x 26 Polaris stent was placed on the left side using cystoscopic and fluoroscopic guidance. Good proximal and distal deployment were noted.  Bladder was emptied per cystoscope.  Procedure was then terminated.  The patient tolerated the procedure well.  There were no immediate periprocedural complications. The patient was taken to the postanesthesia care unit in stable condition.          ______________________________ Sebastian Ache, MD     TM/MEDQ  D:  02/26/2013  T:  02/26/2013  Job:  [id number]

## 2013-02-26 NOTE — Anesthesia Postprocedure Evaluation (Signed)
  Anesthesia Post-op Note  Patient: Todd Rice  Procedure(s) Performed: Procedure(s) (LRB): CYSTOSCOPY WITH RETROGRADE PYELOGRAM, URETEROSCOPY AND STENT PLACEMENT (Bilateral) HOLMIUM LASER APPLICATION (Bilateral)  Patient Location: PACU  Anesthesia Type: General  Level of Consciousness: awake and alert   Airway and Oxygen Therapy: Patient Spontanous Breathing  Post-op Pain: mild  Post-op Assessment: Post-op Vital signs reviewed, Patient's Cardiovascular Status Stable, Respiratory Function Stable, Patent Airway and No signs of Nausea or vomiting  Last Vitals:  Filed Vitals:   02/26/13 1209  BP: 151/91  Pulse: 110  Temp: 37.1 C  Resp: 20    Post-op Vital Signs: stable   Complications: No apparent anesthesia complications. No apnea. No sedation in PACU.

## 2013-02-26 NOTE — H&P (Signed)
Todd Rice is an 57 y.o. male.    Chief Complaint: Pre-Op Bilateral Ureteroscopic Stone Manipulation  HPI:    1 - Rt Renal Neoplasm - Pt with 1.6cm posterio-mid mass with some enhancement, 90% endophytic, incidetnal on MRI for back pain. Now on initial trial of active surveillance with repeat imaging scheduled for 2015.     2 -  Elevated PSA:  11/03/11: 3.25  02/13/12: 5.82  05/07/12: 6.57  2.2014 - NEGATIVE prostate biopsy 1 core positive for acute and chronic inflammation.  3 - Nephrolithiasis:  06/2011 - Rt URS  11/2012 - CT wtih Rt lower pole 5mm and bilateral punctate stones, no hydro  4 - Chronic pseudomonal UTI - ON  methenamine and vitamin C supression for chronic psudomonas x several years w/o interveneing symptoms on therapy. Most recent UCX sens to Cipro, Zosyn.  PMH sig for morbid obesiy, ortho surgery. No CV disease. No strong blood thinners. 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  Today "Todd Rice" is seen to proceed with elective bilateral ureteroscopic stone manipulation for goal of decreasing UTI risk by removing stone nidus. He has been on CX-specific ABX pre-op to reduce colonization.  Past Medical History  Diagnosis Date  . Hypertension   . Right ureteral stone   . Right flank pain     DUE TO KIDNEY STONE  . H/O: gout     STABLE  . OSA on CPAP   . Adult BMI 50.0-59.9 kg/sq m     52.77  . Hepatic steatosis 10/01/11  . Anal fissure   . Vitamin D deficiency   . Anxiety   . Hyperlipidemia   . Diabetes mellitus without complication     borderline  . Kidney tumor     right upper kidney tumor, monitoring  . GERD (gastroesophageal reflux disease)     30 years ago, maybe from peptic ulcer  . History of kidney stones     Past Surgical History  Procedure Laterality Date  . Anal fissure repair  10-12-2000  . Nasal septum surgery  1985  . Left medial femoral condyle debridement & drilling/ removal loose body  09-15-2003  . Stone extraction  with basket  06/12/2011    Procedure: STONE EXTRACTION WITH BASKET;  Surgeon: Garnett Farm, MD;  Location: [facility];  Service: Urology;  Laterality: Right;  . Kidney stone removal      06/2011-stent placement  . Colonoscopy N/A 07/01/2012    Procedure: COLONOSCOPY;  Surgeon: Hart Carwin, MD;  Location: [facility];  Service: Endoscopy;  Laterality: N/A;    Family History  Problem Relation Age of Onset  . Heart disease Mother     Atrial fibrillation  . Lung cancer Father     was a smoker   Social History:  reports that he has never smoked. He has never used smokeless tobacco. He reports that he does not drink alcohol or use illicit drugs.  Allergies: No Known Allergies  Medications Prior to Admission  Medication Sig Dispense Refill  . albuterol (PROVENTIL HFA;VENTOLIN HFA) 108 (90 BASE) MCG/ACT inhaler Inhale 2 puffs into the lungs every 6 (six) hours as needed for wheezing.      Marland Kitchen allopurinol (ZYLOPRIM) 300 MG tablet Take 300 mg by mouth daily.      Marland Kitchen ALPRAZolam (XANAX) 0.5 MG tablet Take 0.5 mg by mouth 3 (three) times daily as needed for sleep or anxiety.       Marland Kitchen aspirin EC  81 MG tablet Take 81 mg by mouth at bedtime.      . Cholecalciferol (VITAMIN D3) 10000 UNITS capsule Take 10,000 Units by mouth daily.      . ciprofloxacin (CIPRO) 500 MG tablet Take 500 mg by mouth 2 (two) times daily.      . colchicine 0.6 MG tablet Take 0.6 mg by mouth 2 (two) times daily.      . hydrocortisone (ANUSOL-HC) 2.5 % rectal cream Place 1 application rectally 2 (two) times daily as needed for hemorrhoids.      . metFORMIN (GLUCOPHAGE) 500 MG tablet Take 500 mg by mouth 2 (two) times daily with a meal.      . methenamine (HIPREX) 1 G tablet Take 1 g by mouth 2 (two) times daily with a meal.      . Probiotic Product (PROBIOTIC DAILY PO) Take 1 tablet by mouth daily.      . promethazine (PHENERGAN) 25 MG tablet Take 1 tablet (25 mg total) by mouth every 6 (six) hours as needed for  nausea.  10 tablet  0  . rosuvastatin (CRESTOR) 10 MG tablet Take 10 mg by mouth at bedtime.       . valsartan-hydrochlorothiazide (DIOVAN-HCT) 320-25 MG per tablet Take 1 tablet by mouth every morning.         Results for orders placed during the hospital encounter of 02/24/13 (from the past 48 hour(s))  BASIC METABOLIC PANEL     Status: Abnormal   Collection Time    02/24/13  2:10 PM      Result Value Range   Sodium 137  135 - 145 mEq/L   Potassium 3.9  3.5 - 5.1 mEq/L   Chloride 101  96 - 112 mEq/L   CO2 25  19 - 32 mEq/L   Glucose, Bld 174 (*) 70 - 99 mg/dL   BUN 19  6 - 23 mg/dL   Creatinine, Ser 1.61  0.50 - 1.35 mg/dL   Calcium 09.6  8.4 - 04.5 mg/dL   GFR calc non Af Amer 71 (*) >90 mL/min   GFR calc Af Amer 82 (*) >90 mL/min   Comment: (NOTE)     The eGFR has been calculated using the CKD EPI equation.     This calculation has not been validated in all clinical situations.     eGFR's persistently <90 mL/min signify possible Chronic Kidney     Disease.  CBC     Status: None   Collection Time    02/24/13  2:10 PM      Result Value Range   WBC 8.2  4.0 - 10.5 K/uL   RBC 5.13  4.22 - 5.81 MIL/uL   Hemoglobin 15.3  13.0 - 17.0 g/dL   HCT 40.9  81.1 - 91.4 %   MCV 84.2  78.0 - 100.0 fL   MCH 29.8  26.0 - 34.0 pg   MCHC 35.4  30.0 - 36.0 g/dL   RDW 78.2  95.6 - 21.3 %   Platelets 152  150 - 400 K/uL   Dg Chest 2 View  02/24/2013   CLINICAL DATA:  Pre cystoscopy  EXAM: CHEST  2 VIEW  COMPARISON:  Chest CT 02/08/2012  FINDINGS: Normal mediastinum and cardiac silhouette. Normal pulmonary vasculature. No evidence of effusion, infiltrate, or pneumothorax. No acute bony abnormality.  IMPRESSION: No acute cardiopulmonary process.   Electronically Signed   By: Genevive Bi M.D.   On: 02/24/2013 15:49    Review of  Systems  Constitutional: Negative.  Negative for fever, chills and malaise/fatigue.  HENT: Negative.   Eyes: Negative.   Respiratory: Negative.    Cardiovascular: Negative.   Gastrointestinal: Negative.   Genitourinary: Negative.   Musculoskeletal: Negative.   Skin: Negative.   Neurological: Negative.   Endo/Heme/Allergies: Negative.   Psychiatric/Behavioral: Negative.     There were no vitals taken for this visit. Physical Exam  Constitutional: He is oriented to person, place, and time. He appears well-developed and well-nourished.  HENT:  Head: Normocephalic and atraumatic.  Eyes: EOM are normal. Pupils are equal, round, and reactive to light.  Neck: Normal range of motion. Neck supple.  Cardiovascular: Normal rate and regular rhythm.   Respiratory: Effort normal and breath sounds normal.  GI: Soft. Bowel sounds are normal.   truncal obesity  Genitourinary: Penis normal.  Musculoskeletal: Normal range of motion.  Neurological: He is alert and oriented to person, place, and time.  Skin: Skin is warm and dry.  Psychiatric: He has a normal mood and affect. His behavior is normal. Judgment and thought content normal.     Assessment/Plan  1 - Rt Renal Neoplasm - Continue surveillance as planned.    2 -  Elevated PSA - Continue annual surveillance.  3 - Nephrolithiasis - present stone burden minimal and in relativley low risk locations in lower pole but likley contributing to chronic psudomonas risk. Discussed obervation v. bilateral ureteroscopic stone manipulation and he wants to proceed wtih the latter.  We rediscussed ureteroscopic stone manipulation with basketing and laser-lithotripsy in detail.  We rediscussed risks including bleeding, infection, damage to kidney / ureter  bladder, rarely loss of kidney. We rediscussed anesthetic risks and rare but serious surgical complications including DVT, PE, MI, and mortality. We specifically readdressed that in 5-10% of cases a staged approach is required with stenting followed by re-attempt ureteroscopy if anatomy unfavorable. The patient voiced understanding and wises to  proceed.   I specifically restated he would be at increased risk of infection given his chronic psudomonas but htat we would take all precautios to reduce by using pre-op double and triple coverage ABX. I also stated he would absolutely need bilateral stents as a part of his procedure and he voiced understanding.  4 - Chronic pseudomonal UTI  - Doing well on current supression. Has been on Cipro 3 days prior to ureteroscopic stone manipulation.   Todd Rice 02/26/2013, 6:29 AM

## 2013-02-26 NOTE — Transfer of Care (Signed)
Immediate Anesthesia Transfer of Care Note  Patient: Todd Rice  Procedure(s) Performed: Procedure(s): CYSTOSCOPY WITH RETROGRADE PYELOGRAM, URETEROSCOPY AND STENT PLACEMENT (Bilateral) HOLMIUM LASER APPLICATION (Bilateral)  Patient Location: PACU  Anesthesia Type:General  Level of Consciousness: awake, alert , oriented and patient cooperative  Airway & Oxygen Therapy: Patient Spontanous Breathing and Patient connected to face mask oxygen  Post-op Assessment: Report given to PACU RN, Post -op Vital signs reviewed and stable and Patient moving all extremities  Post vital signs: Reviewed and stable  Complications: No apparent anesthesia complications

## 2013-02-27 ENCOUNTER — Encounter (HOSPITAL_COMMUNITY): Payer: Self-pay | Admitting: Urology

## 2013-03-03 ENCOUNTER — Encounter (HOSPITAL_COMMUNITY): Payer: Self-pay | Admitting: Emergency Medicine

## 2013-03-03 ENCOUNTER — Emergency Department (HOSPITAL_COMMUNITY)
Admission: EM | Admit: 2013-03-03 | Discharge: 2013-03-03 | Disposition: A | Discharge status: Home / Self Care | Payer: 59 | Attending: Emergency Medicine | Admitting: Emergency Medicine

## 2013-03-03 ENCOUNTER — Emergency Department (HOSPITAL_COMMUNITY): Payer: 59

## 2013-03-03 DIAGNOSIS — F419 Anxiety disorder, unspecified: Secondary | ICD-10-CM

## 2013-03-03 DIAGNOSIS — R509 Fever, unspecified: Secondary | ICD-10-CM | POA: Insufficient documentation

## 2013-03-03 DIAGNOSIS — Z87442 Personal history of urinary calculi: Secondary | ICD-10-CM | POA: Insufficient documentation

## 2013-03-03 DIAGNOSIS — E119 Type 2 diabetes mellitus without complications: Secondary | ICD-10-CM | POA: Insufficient documentation

## 2013-03-03 DIAGNOSIS — E785 Hyperlipidemia, unspecified: Secondary | ICD-10-CM | POA: Insufficient documentation

## 2013-03-03 DIAGNOSIS — F411 Generalized anxiety disorder: Secondary | ICD-10-CM | POA: Insufficient documentation

## 2013-03-03 DIAGNOSIS — I10 Essential (primary) hypertension: Secondary | ICD-10-CM | POA: Insufficient documentation

## 2013-03-03 DIAGNOSIS — Z79899 Other long term (current) drug therapy: Secondary | ICD-10-CM | POA: Insufficient documentation

## 2013-03-03 DIAGNOSIS — Z7982 Long term (current) use of aspirin: Secondary | ICD-10-CM | POA: Insufficient documentation

## 2013-03-03 DIAGNOSIS — Z87448 Personal history of other diseases of urinary system: Secondary | ICD-10-CM | POA: Insufficient documentation

## 2013-03-03 DIAGNOSIS — R0602 Shortness of breath: Secondary | ICD-10-CM | POA: Insufficient documentation

## 2013-03-03 DIAGNOSIS — Z8719 Personal history of other diseases of the digestive system: Secondary | ICD-10-CM | POA: Insufficient documentation

## 2013-03-03 DIAGNOSIS — E559 Vitamin D deficiency, unspecified: Secondary | ICD-10-CM | POA: Insufficient documentation

## 2013-03-03 DIAGNOSIS — M109 Gout, unspecified: Secondary | ICD-10-CM | POA: Insufficient documentation

## 2013-03-03 DIAGNOSIS — Z6841 Body Mass Index (BMI) 40.0 and over, adult: Secondary | ICD-10-CM | POA: Insufficient documentation

## 2013-03-03 DIAGNOSIS — G4733 Obstructive sleep apnea (adult) (pediatric): Secondary | ICD-10-CM | POA: Insufficient documentation

## 2013-03-03 LAB — D-DIMER, QUANTITATIVE (NOT AT ARMC): D-Dimer, Quant: 0.65 ug/mL-FEU — ABNORMAL HIGH (ref 0.00–0.48)

## 2013-03-03 LAB — CBC
Hemoglobin: 14.6 g/dL (ref 13.0–17.0)
MCH: 30.7 pg (ref 26.0–34.0)
MCHC: 36.5 g/dL — ABNORMAL HIGH (ref 30.0–36.0)
MCV: 84 fL (ref 78.0–100.0)
Platelets: 162 10*3/uL (ref 150–400)
RDW: 13.5 % (ref 11.5–15.5)
WBC: 8.6 10*3/uL (ref 4.0–10.5)

## 2013-03-03 LAB — BASIC METABOLIC PANEL
CO2: 24 mEq/L (ref 19–32)
Calcium: 9.7 mg/dL (ref 8.4–10.5)
Creatinine, Ser: 1.26 mg/dL (ref 0.50–1.35)
GFR calc Af Amer: 71 mL/min — ABNORMAL LOW (ref >90)
GFR calc non Af Amer: 61 mL/min — ABNORMAL LOW (ref >90)
Glucose, Bld: 146 mg/dL — ABNORMAL HIGH (ref 70–99)
Sodium: 134 mEq/L — ABNORMAL LOW (ref 135–145)

## 2013-03-03 LAB — PRO B NATRIURETIC PEPTIDE: Pro B Natriuretic peptide (BNP): 119.2 pg/mL (ref 0–125)

## 2013-03-03 LAB — POCT I-STAT TROPONIN I: Troponin i, poc: 0 ng/mL (ref 0.00–0.08)

## 2013-03-03 MED ORDER — POTASSIUM CHLORIDE CRYS ER 20 MEQ PO TBCR
40.0000 meq | EXTENDED_RELEASE_TABLET | Freq: Once | ORAL | Status: AC
  Administered 2013-03-03: 40 meq via ORAL
  Filled 2013-03-03: qty 2

## 2013-03-03 MED ORDER — IOHEXOL 350 MG/ML SOLN
100.0000 mL | Freq: Once | INTRAVENOUS | Status: AC | PRN
  Administered 2013-03-03: 100 mL via INTRAVENOUS

## 2013-03-03 NOTE — ED Notes (Signed)
Called CT patient ready for CT. Patient notified.

## 2013-03-03 NOTE — ED Notes (Signed)
Called lab stated do not have available blood tube for ddimer. Need new blood draw.

## 2013-03-03 NOTE — ED Notes (Signed)
Pt c/o increased SOB today; pt had recent kidney stone removed; pt sts had stents pulled out of kidneys today; pt sts SOB starting today; pt trachypanic at present; pt denies CP

## 2013-03-03 NOTE — ED Provider Notes (Signed)
CSN: [id number]     Arrival date & time 03/03/13  1551 History   First MD Initiated Contact with Patient 03/03/13 1810     Chief Complaint  Patient presents with  . Shortness of Breath   (Consider location/radiation/quality/duration/timing/severity/associated sxs/prior Treatment) HPI Pt is a 57yo male c/o gradually worsening SOB that started earlier today after he had some stents pulled out of his kidneys today that were in place for renal stones.  Also reports episode of hyperventilation earlier today and dry cough but no hemoptysis.  He reports having hx of anxiety and OSA.  He has been on Roxicet every 4 hours for the last 3 days and has been on his CPAP machine during the day for napping due to being on the narcotic, as instructed by his physician.  States he feels like he has taken in more air into his stomach, feeling more bloated and constipated but has had some relief with burping and BMs after taking some stool softeners.  Reports 1 "good" BM yesterday and today.  Reports "post-surgical fever" that has responded well with tylenol. Denies chest pain. Denies hx of overnight hospital stays, long travel, or hx of PE or DVT.  Past Medical History  Diagnosis Date  . Hypertension   . Right ureteral stone   . Right flank pain     DUE TO KIDNEY STONE  . H/O: gout     STABLE  . OSA on CPAP   . Adult BMI 50.0-59.9 kg/sq m     52.77  . Hepatic steatosis 10/01/11  . Anal fissure   . Vitamin D deficiency   . Anxiety   . Hyperlipidemia   . Diabetes mellitus without complication     borderline  . Kidney tumor     right upper kidney tumor, monitoring  . GERD (gastroesophageal reflux disease)     30 years ago, maybe from peptic ulcer  . History of kidney stones    Past Surgical History  Procedure Laterality Date  . Anal fissure repair  10-12-2000  . Nasal septum surgery  1985  . Left medial femoral condyle debridement & drilling/ removal loose body  09-15-2003  . Stone extraction with  basket  06/12/2011    Procedure: STONE EXTRACTION WITH BASKET;  Surgeon: Garnett Farm, MD;  Location: [facility];  Service: Urology;  Laterality: Right;  . Kidney stone removal      06/2011-stent placement  . Colonoscopy N/A 07/01/2012    Procedure: COLONOSCOPY;  Surgeon: Hart Carwin, MD;  Location: [facility];  Service: Endoscopy;  Laterality: N/A;  . Cystoscopy with retrograde pyelogram, ureteroscopy and stent placement Bilateral 02/26/2013    Procedure: CYSTOSCOPY WITH RETROGRADE PYELOGRAM, URETEROSCOPY AND STENT PLACEMENT;  Surgeon: Sebastian Ache, MD;  Location: [facility];  Service: Urology;  Laterality: Bilateral;  . Holmium laser application Bilateral 02/26/2013    Procedure: HOLMIUM LASER APPLICATION;  Surgeon: Sebastian Ache, MD;  Location: [facility];  Service: Urology;  Laterality: Bilateral;   Family History  Problem Relation Age of Onset  . Heart disease Mother     Atrial fibrillation  . Lung cancer Father     was a smoker   History  Substance Use Topics  . Smoking status: Never Smoker   . Smokeless tobacco: Never Used  . Alcohol Use: No    Review of Systems  Constitutional: Positive for fever. Negative for chills and diaphoresis.  Respiratory: Positive for shortness of breath. Negative for cough.   Cardiovascular: Negative  for chest pain.  Gastrointestinal: Negative for nausea, vomiting, abdominal pain and diarrhea.  Musculoskeletal: Negative for back pain.  All other systems reviewed and are negative.    Allergies  Review of patient's allergies indicates no known allergies.  Home Medications   Current Outpatient Rx  Name  Route  Sig  Dispense  Refill  . albuterol (PROVENTIL HFA;VENTOLIN HFA) 108 (90 BASE) MCG/ACT inhaler   Inhalation   Inhale 2 puffs into the lungs every 6 (six) hours as needed for wheezing or shortness of breath.          . allopurinol (ZYLOPRIM) 300 MG tablet   Oral   Take 300 mg by mouth daily.         Marland Kitchen ALPRAZolam  (XANAX) 0.5 MG tablet   Oral   Take 0.5 mg by mouth 3 (three) times daily as needed for sleep or anxiety.          Marland Kitchen aspirin EC 81 MG tablet   Oral   Take 81 mg by mouth at bedtime.         . Cholecalciferol (VITAMIN D3) 10000 UNITS capsule   Oral   Take 10,000 Units by mouth daily.         . colchicine 0.6 MG tablet   Oral   Take 0.6 mg by mouth 2 (two) times daily.         . metFORMIN (GLUCOPHAGE) 500 MG tablet   Oral   Take 500 mg by mouth 2 (two) times daily with a meal.         . methenamine (HIPREX) 1 G tablet   Oral   Take 1 g by mouth 2 (two) times daily with a meal.         . oxybutynin (DITROPAN) 5 MG tablet   Oral   Take 1 tablet (5 mg total) by mouth every 8 (eight) hours as needed. For bladder spasms / stent discomfort   30 tablet   1   . oxyCODONE-acetaminophen (ROXICET) 5-325 MG per tablet   Oral   Take 1 tablet by mouth every 4 (four) hours as needed for pain. Post-operatively   40 tablet   0   . Probiotic Product (PROBIOTIC DAILY PO)   Oral   Take 1 tablet by mouth daily.         . rosuvastatin (CRESTOR) 10 MG tablet   Oral   Take 10 mg by mouth at bedtime.          . senna-docusate (SENOKOT-S) 8.6-50 MG per tablet   Oral   Take 1 tablet by mouth 2 (two) times daily. While taking pain meds to prevent constipation   30 tablet   1   . valsartan-hydrochlorothiazide (DIOVAN-HCT) 320-25 MG per tablet   Oral   Take 1 tablet by mouth every morning.           BP 119/51  Pulse 77  Temp(Src) 98 F (36.7 C) (Oral)  Resp 16  SpO2 97% Physical Exam  Nursing note and vitals reviewed. Constitutional: He appears well-developed and well-nourished.  Morbidly obese male lying in exam bed. NAD.  HENT:  Head: Normocephalic and atraumatic.  Eyes: Conjunctivae are normal. No scleral icterus.  Neck: Normal range of motion.  Cardiovascular: Normal rate, regular rhythm and normal heart sounds.   Pulmonary/Chest: Effort normal and breath  sounds normal. No respiratory distress. He has no wheezes. He has no rales. He exhibits no tenderness.  No respiratory distress. Able to speak  in full sentences without difficulty. Lungs: CTAB  Abdominal: Soft. Bowel sounds are normal. He exhibits no distension and no mass. There is no tenderness. There is no rebound and no guarding.  Musculoskeletal: Normal range of motion.  Neurological: He is alert.  Skin: Skin is warm and dry.    ED Course  Procedures (including critical care time) Labs Review Labs Reviewed  CBC - Abnormal; Notable for the following:    MCHC 36.5 (*)    All other components within normal limits  BASIC METABOLIC PANEL - Abnormal; Notable for the following:    Sodium 134 (*)    Potassium 3.0 (*)    Glucose, Bld 146 (*)    GFR calc non Af Amer 61 (*)    GFR calc Af Amer 71 (*)    All other components within normal limits  D-DIMER, QUANTITATIVE - Abnormal; Notable for the following:    D-Dimer, Quant 0.65 (*)    All other components within normal limits  PRO B NATRIURETIC PEPTIDE  POCT I-STAT TROPONIN I   Imaging Review Dg Chest 2 View  03/03/2013   CLINICAL DATA:  Shortness of breath with dry cough. Symptoms worse since last week when the patient had surgery. Patient had stones removed from both kidneys. The patient has sleep apnea, hypertension.  EXAM: CHEST  2 VIEW  COMPARISON:  02/24/2013  FINDINGS: The heart size and mediastinal contours are within normal limits. Both lungs are clear. The visualized skeletal structures are unremarkable.  IMPRESSION: No active cardiopulmonary disease.   Electronically Signed   By: Rosalie Gums M.D.   On: 03/03/2013 17:31   Ct Angio Chest Pe W/cm &/or Wo Cm  03/03/2013   CLINICAL DATA:  Shortness of breath.  EXAM: CT ANGIOGRAPHY CHEST WITH CONTRAST  TECHNIQUE: Multidetector CT imaging of the chest was performed using the standard protocol during bolus administration of intravenous contrast. Multiplanar CT image reconstructions  including MIPs were obtained to evaluate the vascular anatomy.  CONTRAST:  OMNIPAQUE IOHEXOL 350 MG/ML SOLN  COMPARISON:  03/06/2012  FINDINGS: Satisfactory opacification of pulmonary arteries noted, and there is no evidence of pulmonary emboli. Adequate contrast opacification of the thoracic aorta with no evidence of dissection, aneurysm, or stenosis. There is classic brachiocephalic arch anatomy without proximal stenosis. No pleural or pericardial effusion. Patchy coronary calcifications. No hilar or mediastinal adenopathy. Calcified 3 mm subpleural granuloma in the posterolateral left lower lobe. Lungs otherwise clear. Mild spondylitic changes in the mid and lower thoracic spine. Sternum intact. Visualized portions of upper abdomen unremarkable.  Review of the MIP images confirms the above findings.  IMPRESSION: 1. Negative for acute PE or thoracic aortic dissection.   Electronically Signed   By: Oley Balm M.D.   On: 03/03/2013 21:47    EKG Interpretation   None       MDM   1. SOB (shortness of breath)   2. Anxiety    Pt c/o SOB w/o chest pain. Reports recent out-patient surgery for renal stents for tx of renal stones. Pt low risk for PE however due to recent surgery, D-dimer likely to be elevated. Cardiac workup performed as well.  CXR: unremarkable. Labs: Troponin-negative, CBC-unremarkable. BMP-hypokalemic, 3.0, K-dur given.  BNP: unremarkable.  D-dimer-elevated (as expected, proceeded with CT angio chest)  CT angio chest: negative for acute PE or thoracic aortic dissection.  All labs/imaging/findings discussed with patient. All questions answered and concerns addressed. Will discharge pt home and have pt f/u with PCP, Dr. Oneta Rack. Return  precautions given. Pt verbalized understanding and agreement with tx plan. Vitals: unremarkable. Discharged in stable condition.  Pt states he still has doctor's note from surgeon to be out of work until Thursday, 10/30.  Advised to continue  to rest the next few days.  Discussed pt with attending during ED encounter and agrees with plan.     Junius Finner, PA-C 03/04/13 [phone]

## 2013-03-08 NOTE — ED Provider Notes (Signed)
Medical screening examination/treatment/procedure(s) were performed by non-physician practitioner and as supervising physician I was immediately available for consultation/collaboration.  EKG Interpretation     Ventricular Rate:  81 PR Interval:  186 QRS Duration: 78 QT Interval:  344 QTC Calculation: 399 R Axis:   23 Text Interpretation:  Normal sinus rhythm with sinus arrhythmia Normal ECG ED PHYSICIAN INTERPRETATION AVAILABLE IN [facility]              Roney Marion, MD 03/08/13 1102

## 2013-05-10 ENCOUNTER — Encounter: Payer: Self-pay | Admitting: Internal Medicine

## 2013-05-13 ENCOUNTER — Ambulatory Visit: Payer: Self-pay | Admitting: Internal Medicine

## 2013-05-14 ENCOUNTER — Encounter: Payer: Self-pay | Admitting: Emergency Medicine

## 2013-05-14 ENCOUNTER — Ambulatory Visit (INDEPENDENT_AMBULATORY_CARE_PROVIDER_SITE_OTHER): Payer: 59 | Admitting: Emergency Medicine

## 2013-05-14 ENCOUNTER — Other Ambulatory Visit: Payer: Self-pay | Admitting: Radiation Oncology

## 2013-05-14 ENCOUNTER — Other Ambulatory Visit: Payer: Self-pay | Admitting: Urology

## 2013-05-14 VITALS — BP 128/82 | HR 84 | Temp 98.0°F | Resp 20 | Ht 67.0 in | Wt 318.0 lb

## 2013-05-14 DIAGNOSIS — E119 Type 2 diabetes mellitus without complications: Secondary | ICD-10-CM

## 2013-05-14 DIAGNOSIS — D49519 Neoplasm of unspecified behavior of unspecified kidney: Secondary | ICD-10-CM

## 2013-05-14 DIAGNOSIS — I10 Essential (primary) hypertension: Secondary | ICD-10-CM

## 2013-05-14 DIAGNOSIS — E669 Obesity, unspecified: Secondary | ICD-10-CM

## 2013-05-14 DIAGNOSIS — E782 Mixed hyperlipidemia: Secondary | ICD-10-CM

## 2013-05-14 NOTE — Patient Instructions (Signed)

## 2013-05-14 NOTE — Progress Notes (Signed)
Subjective:    Patient ID: Todd Rice, male    DOB: 03/04/1956, 58 y.o.   MRN: [id number]  HPI Comments: 58 YO MALE presents for 3 month F/U for HTN, Cholesterol, DM2, D. deficient LAST LABS T 205 TG 212 H 38 L 125 A1C 7.0 INSULIN 68 D 54 He has d/c soda, decreased sweets/ portions. He is busy with work but no cardio.  Kidney repeat scan 05/21/13 for Cancer re-evaluation of Right kidney.   Hypertension   Current Outpatient Prescriptions on File Prior to Visit  Medication Sig Dispense Refill  . albuterol (PROVENTIL HFA;VENTOLIN HFA) 108 (90 BASE) MCG/ACT inhaler Inhale 2 puffs into the lungs every 6 (six) hours as needed for wheezing or shortness of breath.       . allopurinol (ZYLOPRIM) 300 MG tablet Take 300 mg by mouth daily.      Marland Kitchen ALPRAZolam (XANAX) 0.5 MG tablet Take 0.5 mg by mouth 3 (three) times daily as needed for sleep or anxiety.       Marland Kitchen aspirin EC 81 MG tablet Take 81 mg by mouth at bedtime.      . Cholecalciferol (VITAMIN D3) 10000 UNITS capsule Take 10,000 Units by mouth daily.      . colchicine 0.6 MG tablet Take 0.6 mg by mouth 2 (two) times daily.      . metFORMIN (GLUCOPHAGE) 500 MG tablet Take 500 mg by mouth 2 (two) times daily with a meal.      . oxybutynin (DITROPAN) 5 MG tablet Take 1 tablet (5 mg total) by mouth every 8 (eight) hours as needed. For bladder spasms / stent discomfort  30 tablet  1  . oxyCODONE-acetaminophen (ROXICET) 5-325 MG per tablet Take 1 tablet by mouth every 4 (four) hours as needed for pain. Post-operatively  40 tablet  0  . phentermine (ADIPEX-P) 37.5 MG tablet Take 37.5 mg by mouth daily before breakfast.      . Probiotic Product (PROBIOTIC DAILY PO) Take 1 tablet by mouth daily.      . rosuvastatin (CRESTOR) 10 MG tablet Take 10 mg by mouth at bedtime.       . valsartan-hydrochlorothiazide (DIOVAN-HCT) 320-25 MG per tablet Take 1 tablet by mouth every morning.        No current facility-administered medications on file prior to  visit.    Review of patient's allergies indicates no known allergies.  Past Medical History  Diagnosis Date  . Hypertension   . Right ureteral stone   . Right flank pain     DUE TO KIDNEY STONE  . H/O: gout     STABLE  . OSA on CPAP   . Adult BMI 50.0-59.9 kg/sq m     52.77  . Hepatic steatosis 10/01/11  . Anal fissure   . Vitamin D deficiency   . Anxiety   . Hyperlipidemia   . Diabetes mellitus without complication     borderline  . Kidney tumor     right upper kidney tumor, monitoring  . GERD (gastroesophageal reflux disease)     30 years ago, maybe from peptic ulcer  . History of kidney stones       Review of Systems  All other systems reviewed and are negative.   BP 128/82  Pulse 84  Temp(Src) 98 F (36.7 C) (Temporal)  Resp 20  Ht 5\' 7"  (1.702 m)  Wt 318 lb (144.244 kg)  BMI 49.79 kg/m2      Objective:   Physical Exam  Nursing note and vitals reviewed. Constitutional: He is oriented to person, place, and time. He appears well-developed and well-nourished.  Obese  HENT:  Head: Normocephalic and atraumatic.  Right Ear: External ear normal.  Left Ear: External ear normal.  Nose: Nose normal.  Eyes: Conjunctivae and EOM are normal.  Neck: Normal range of motion. Neck supple. No JVD present. No thyromegaly present.  Cardiovascular: Normal rate, regular rhythm, normal heart sounds and intact distal pulses.   Pulmonary/Chest: Effort normal and breath sounds normal.  Abdominal: Soft. Bowel sounds are normal. He exhibits no distension and no mass. There is no tenderness. There is no rebound and no guarding.  Musculoskeletal: Normal range of motion. He exhibits no edema and no tenderness.       Arms: Lymphadenopathy:    He has no cervical adenopathy.  Neurological: He is alert and oriented to person, place, and time. He has normal reflexes. No cranial nerve deficit. Coordination normal.  Skin: Skin is warm and dry.  Psychiatric: He has a normal mood and  affect. His behavior is normal. Judgment and thought content normal.          Assessment & Plan:  1.  3 month F/U for obese, HTN, Cholesterol,DM, D. Deficient. Needs healthy diet, cardio QD and obtain healthy weight. Check Labs, Check BP if >130/80 call office, Check BS if >200 call office 2. Left back fullness- monitor he w/c with any increased sx

## 2013-05-15 LAB — LIPID PANEL
CHOLESTEROL: 172 mg/dL (ref 0–200)
HDL: 42 mg/dL (ref >39)
LDL Cholesterol: 100 mg/dL — ABNORMAL HIGH (ref 0–99)
Total CHOL/HDL Ratio: 4.1 Ratio
Triglycerides: 151 mg/dL — ABNORMAL HIGH (ref <150)
VLDL: 30 mg/dL (ref 0–40)

## 2013-05-15 LAB — CBC WITH DIFFERENTIAL/PLATELET
BASOS ABS: 0 10*3/uL (ref 0.0–0.1)
Basophils Relative: 0 % (ref 0–1)
EOS ABS: 0.1 10*3/uL (ref 0.0–0.7)
Eosinophils Relative: 1 % (ref 0–5)
HCT: 46 % (ref 39.0–52.0)
Hemoglobin: 15.7 g/dL (ref 13.0–17.0)
Lymphocytes Relative: 34 % (ref 12–46)
Lymphs Abs: 3.1 10*3/uL (ref 0.7–4.0)
MCH: 29.2 pg (ref 26.0–34.0)
MCHC: 34.1 g/dL (ref 30.0–36.0)
MCV: 85.5 fL (ref 78.0–100.0)
Monocytes Absolute: 0.7 10*3/uL (ref 0.1–1.0)
Monocytes Relative: 8 % (ref 3–12)
NEUTROS PCT: 57 % (ref 43–77)
Neutro Abs: 5.3 10*3/uL (ref 1.7–7.7)
PLATELETS: 176 10*3/uL (ref 150–400)
RBC: 5.38 MIL/uL (ref 4.22–5.81)
RDW: 14.1 % (ref 11.5–15.5)
WBC: 9.3 10*3/uL (ref 4.0–10.5)

## 2013-05-15 LAB — HEPATIC FUNCTION PANEL
ALBUMIN: 4.1 g/dL (ref 3.5–5.2)
ALK PHOS: 57 U/L (ref 39–117)
ALT: 49 U/L (ref 0–53)
AST: 36 U/L (ref 0–37)
BILIRUBIN DIRECT: 0.1 mg/dL (ref 0.0–0.3)
Indirect Bilirubin: 0.5 mg/dL (ref 0.0–0.9)
Total Bilirubin: 0.6 mg/dL (ref 0.3–1.2)
Total Protein: 7.6 g/dL (ref 6.0–8.3)

## 2013-05-15 LAB — BASIC METABOLIC PANEL WITH GFR
BUN: 19 mg/dL (ref 6–23)
CALCIUM: 9.8 mg/dL (ref 8.4–10.5)
CO2: 28 mEq/L (ref 19–32)
Chloride: 103 mEq/L (ref 96–112)
Creat: 1.16 mg/dL (ref 0.50–1.35)
GFR, EST AFRICAN AMERICAN: 80 mL/min
GFR, EST NON AFRICAN AMERICAN: 69 mL/min
Glucose, Bld: 125 mg/dL — ABNORMAL HIGH (ref 70–99)
Potassium: 3.7 mEq/L (ref 3.5–5.3)
Sodium: 139 mEq/L (ref 135–145)

## 2013-05-15 LAB — HEMOGLOBIN A1C
HEMOGLOBIN A1C: 7.2 % — AB (ref <5.7)
Mean Plasma Glucose: 160 mg/dL — ABNORMAL HIGH (ref <117)

## 2013-05-15 LAB — INSULIN, FASTING: Insulin fasting, serum: 83 u[IU]/mL — ABNORMAL HIGH (ref 3–28)

## 2013-05-21 ENCOUNTER — Ambulatory Visit
Admission: RE | Admit: 2013-05-21 | Discharge: 2013-05-21 | Disposition: A | Discharge status: Home / Self Care | Payer: 59 | Source: Ambulatory Visit | Attending: Radiation Oncology | Admitting: Radiation Oncology

## 2013-05-21 DIAGNOSIS — D49519 Neoplasm of unspecified behavior of unspecified kidney: Secondary | ICD-10-CM

## 2013-05-21 MED ORDER — GADOBENATE DIMEGLUMINE 529 MG/ML IV SOLN
20.0000 mL | Freq: Once | INTRAVENOUS | Status: AC | PRN
  Administered 2013-05-21: 20 mL via INTRAVENOUS

## 2013-06-25 ENCOUNTER — Other Ambulatory Visit: Payer: Self-pay | Admitting: Internal Medicine

## 2013-06-25 ENCOUNTER — Other Ambulatory Visit: Payer: Self-pay | Admitting: Emergency Medicine

## 2013-07-04 ENCOUNTER — Ambulatory Visit (INDEPENDENT_AMBULATORY_CARE_PROVIDER_SITE_OTHER): Payer: 59 | Admitting: Emergency Medicine

## 2013-07-04 ENCOUNTER — Encounter: Payer: Self-pay | Admitting: Emergency Medicine

## 2013-07-04 VITALS — BP 138/70 | HR 98 | Temp 98.6°F | Resp 20 | Ht 67.0 in | Wt 302.0 lb

## 2013-07-04 DIAGNOSIS — R7309 Other abnormal glucose: Secondary | ICD-10-CM

## 2013-07-04 DIAGNOSIS — J329 Chronic sinusitis, unspecified: Secondary | ICD-10-CM

## 2013-07-04 DIAGNOSIS — I10 Essential (primary) hypertension: Secondary | ICD-10-CM

## 2013-07-04 MED ORDER — LEVOFLOXACIN 500 MG PO TABS: 500.0000 mg | ORAL_TABLET | Freq: Every day | ORAL | Status: AC

## 2013-07-04 NOTE — Patient Instructions (Signed)

## 2013-07-04 NOTE — Progress Notes (Signed)
Subjective:    Patient ID: Todd Rice, male    DOB: [birth year], 58 y.o.   MRN: [id number]  HPI Comments: 58 yo male ate several doughnuts yesterday but otherwise has been doing better overall with diet and BS 100s. Today BS 193 and 1000 mg in urine. Patient notes increased fatigue and thirst today. He has been trying to increase water intake. He notes chronic allergy/ sinus congestion but denies any color to production or pain. He denies any urinary symptoms otherwise. He was also noted to have elevated WBC of 13, all of his labs were done at work.   Current Outpatient Prescriptions on File Prior to Visit  Medication Sig Dispense Refill  . albuterol (PROVENTIL HFA;VENTOLIN HFA) 108 (90 BASE) MCG/ACT inhaler Inhale 2 puffs into the lungs every 6 (six) hours as needed for wheezing or shortness of breath.       . allopurinol (ZYLOPRIM) 300 MG tablet Take 300 mg by mouth daily.      Marland Kitchen ALPRAZolam (XANAX) 0.5 MG tablet TAKE 1 TABLET BY MOUTH 1 TO 3 TIMES DAILY  90 tablet  1  . aspirin EC 81 MG tablet Take 81 mg by mouth at bedtime.      . Cholecalciferol (VITAMIN D3) 10000 UNITS capsule Take 10,000 Units by mouth daily.      . colchicine 0.6 MG tablet Take 0.6 mg by mouth 2 (two) times daily.      . metFORMIN (GLUCOPHAGE) 500 MG tablet TAKE 1 TABLET BY MOUTH TWICE DAILY  60 tablet  3  . oxybutynin (DITROPAN) 5 MG tablet Take 1 tablet (5 mg total) by mouth every 8 (eight) hours as needed. For bladder spasms / stent discomfort  30 tablet  1  . oxyCODONE-acetaminophen (ROXICET) 5-325 MG per tablet Take 1 tablet by mouth every 4 (four) hours as needed for pain. Post-operatively  40 tablet  0  . phentermine (ADIPEX-P) 37.5 MG tablet Take 37.5 mg by mouth daily before breakfast.      . Probiotic Product (PROBIOTIC DAILY PO) Take 1 tablet by mouth daily.      . rosuvastatin (CRESTOR) 10 MG tablet Take 10 mg by mouth at bedtime.       . valsartan-hydrochlorothiazide (DIOVAN-HCT) 320-25 MG per tablet  Take 1 tablet by mouth every morning.        No current facility-administered medications on file prior to visit.   No Known Allergies Past Medical History  Diagnosis Date  . Hypertension   . Right ureteral stone   . Right flank pain     DUE TO KIDNEY STONE  . H/O: gout     STABLE  . OSA on CPAP   . Adult BMI 50.0-59.9 kg/sq m     52.77  . Hepatic steatosis 10/01/11  . Anal fissure   . Vitamin D deficiency   . Anxiety   . Hyperlipidemia   . Diabetes mellitus without complication     borderline  . Kidney tumor     right upper kidney tumor, monitoring  . GERD (gastroesophageal reflux disease)     30 years ago, maybe from peptic ulcer  . History of kidney stones        Review of Systems  Constitutional: Positive for fatigue.  Endocrine: Positive for polydipsia.  All other systems reviewed and are negative.   BP 138/70  Pulse 98  Temp(Src) 98.6 F (37 C) (Oral)  Resp 20  Ht 5\' 7"  (1.702 m)  Wt 302 lb (  136.986 kg)  BMI 47.29 kg/m2     Objective:   Physical Exam  Nursing note and vitals reviewed. Constitutional: He is oriented to person, place, and time. He appears well-developed and well-nourished.  HENT:  Head: Normocephalic and atraumatic.  Right Ear: External ear normal.  Left Ear: External ear normal.  Nose: Nose normal.  Eyes: Conjunctivae and EOM are normal.  Neck: Normal range of motion. Neck supple. No JVD present. No thyromegaly present.  Cardiovascular: Normal rate, regular rhythm, normal heart sounds and intact distal pulses.   Pulmonary/Chest: Effort normal and breath sounds normal.  Abdominal: Soft. Bowel sounds are normal. He exhibits no distension and no mass. There is no tenderness. There is no rebound and no guarding.  obese  Musculoskeletal: Normal range of motion. He exhibits no edema and no tenderness.  Lymphadenopathy:    He has no cervical adenopathy.  Neurological: He is alert and oriented to person, place, and time. He has normal  reflexes. No cranial nerve deficit. Coordination normal.  Skin: Skin is warm and dry.  Psychiatric: He has a normal mood and affect. His behavior is normal. Judgment and thought content normal.          Assessment & Plan:  1. HTN- Check BP call if >130/80, increase cardio 2. Elevated BS and Urine sugar level- Advised to increase H2o/ protein and recheck Monday and cal with results, advised restart healthy diet 3. ? Elevated WBC/ Sinus congestion/ Allergic rhinitis- Allegra OTC, increase H2o, allergy hygiene explained. Levaquin 500 mg 1 qd AD

## 2013-07-07 ENCOUNTER — Ambulatory Visit: Payer: Self-pay | Admitting: Emergency Medicine

## 2013-08-27 ENCOUNTER — Ambulatory Visit (INDEPENDENT_AMBULATORY_CARE_PROVIDER_SITE_OTHER): Payer: 59 | Admitting: Emergency Medicine

## 2013-08-27 ENCOUNTER — Encounter: Payer: Self-pay | Admitting: Emergency Medicine

## 2013-08-27 VITALS — BP 128/76 | HR 68 | Temp 99.7°F | Resp 18 | Ht 67.0 in | Wt 304.6 lb

## 2013-08-27 DIAGNOSIS — E782 Mixed hyperlipidemia: Secondary | ICD-10-CM

## 2013-08-27 DIAGNOSIS — R7309 Other abnormal glucose: Secondary | ICD-10-CM

## 2013-08-27 DIAGNOSIS — I10 Essential (primary) hypertension: Secondary | ICD-10-CM

## 2013-08-27 DIAGNOSIS — E669 Obesity, unspecified: Secondary | ICD-10-CM

## 2013-08-27 DIAGNOSIS — E559 Vitamin D deficiency, unspecified: Secondary | ICD-10-CM

## 2013-08-27 LAB — HEPATIC FUNCTION PANEL
ALBUMIN: 4.1 g/dL (ref 3.5–5.2)
ALT: 28 U/L (ref 0–53)
AST: 24 U/L (ref 0–37)
Alkaline Phosphatase: 59 U/L (ref 39–117)
Bilirubin, Direct: 0.1 mg/dL (ref 0.0–0.3)
Indirect Bilirubin: 0.4 mg/dL (ref 0.2–1.2)
Total Bilirubin: 0.5 mg/dL (ref 0.2–1.2)
Total Protein: 7.5 g/dL (ref 6.0–8.3)

## 2013-08-27 LAB — BASIC METABOLIC PANEL WITH GFR
BUN: 21 mg/dL (ref 6–23)
CHLORIDE: 100 meq/L (ref 96–112)
CO2: 28 meq/L (ref 19–32)
Calcium: 9.8 mg/dL (ref 8.4–10.5)
Creat: 1.26 mg/dL (ref 0.50–1.35)
GFR, EST NON AFRICAN AMERICAN: 62 mL/min
GFR, Est African American: 72 mL/min
Glucose, Bld: 87 mg/dL (ref 70–99)
POTASSIUM: 3.9 meq/L (ref 3.5–5.3)
Sodium: 138 mEq/L (ref 135–145)

## 2013-08-27 LAB — LIPID PANEL
CHOLESTEROL: 180 mg/dL (ref 0–200)
HDL: 36 mg/dL — ABNORMAL LOW (ref >39)
LDL Cholesterol: 107 mg/dL — ABNORMAL HIGH (ref 0–99)
Total CHOL/HDL Ratio: 5 Ratio
Triglycerides: 187 mg/dL — ABNORMAL HIGH (ref <150)
VLDL: 37 mg/dL (ref 0–40)

## 2013-08-27 LAB — CBC WITH DIFFERENTIAL/PLATELET
BASOS ABS: 0 10*3/uL (ref 0.0–0.1)
Basophils Relative: 0 % (ref 0–1)
Eosinophils Absolute: 0.2 10*3/uL (ref 0.0–0.7)
Eosinophils Relative: 2 % (ref 0–5)
HCT: 44.8 % (ref 39.0–52.0)
Hemoglobin: 15.8 g/dL (ref 13.0–17.0)
LYMPHS PCT: 37 % (ref 12–46)
Lymphs Abs: 3 10*3/uL (ref 0.7–4.0)
MCH: 29 pg (ref 26.0–34.0)
MCHC: 35.3 g/dL (ref 30.0–36.0)
MCV: 82.2 fL (ref 78.0–100.0)
MONO ABS: 0.7 10*3/uL (ref 0.1–1.0)
Monocytes Relative: 9 % (ref 3–12)
Neutro Abs: 4.2 10*3/uL (ref 1.7–7.7)
Neutrophils Relative %: 52 % (ref 43–77)
PLATELETS: 181 10*3/uL (ref 150–400)
RBC: 5.45 MIL/uL (ref 4.22–5.81)
RDW: 14.1 % (ref 11.5–15.5)
WBC: 8 10*3/uL (ref 4.0–10.5)

## 2013-08-27 LAB — MAGNESIUM: Magnesium: 1.8 mg/dL (ref 1.5–2.5)

## 2013-08-27 NOTE — Patient Instructions (Signed)
We want weight loss that will last so you should lose 1-2 pounds a week.  THAT IS IT! Please pick THREE things a month to change. Once it is a habit check off the item. Then pick another three items off the list to become habits.  If you are already doing a habit on the list GREAT!  Cross that item off! o Don't drink your calories. Ie, alcohol, soda, fruit juice, and sweet tea.  o Drink more water. Drink a glass when you feel hungry or before each meal.  o Eat breakfast - Complex carb and protein (likeDannon light and fit yogurt, oatmeal, fruit, eggs, turkey bacon). o Measure your cereal.  Eat no more than one cup a day. (ie Kashi) o Eat an apple a day. o Add a vegetable a day. o Try a new vegetable a month. o Use Pam! Stop using oil or butter to cook. o Don't finish your plate or use smaller plates. o Share your dessert. o Eat sugar free Jello for dessert or frozen grapes. o Don't eat 2-3 hours before bed. o Switch to whole wheat bread, pasta, and brown rice. o Make healthier choices when you eat out. No fries! o Pick baked chicken, NOT fried. o Don't forget to SLOW DOWN when you eat. It is not going anywhere.  o Take the stairs. o Park far away in the parking lot o Lift soup cans (or weights) for 10 minutes while watching TV. o Walk at work for 10 minutes during break. o Walk outside 1 time a week with your friend, kids, dog, or significant other. o Start a walking group at church. o Walk the mall as much as you can tolerate.  o Keep a food diary. o Weigh yourself daily. o Walk for 15 minutes 3 days per week. o Cook at home more often and eat out less.  If life happens and you go back to old habits, it is okay.  Just start over. You can do it!   If you experience chest pain, get short of breath, or tired during the exercise, please stop immediately and inform your doctor.     Bad carbs also include fruit juice, alcohol, and sweet tea. These are empty calories that do not signal to  your brain that you are full.   Please remember the good carbs are still carbs which convert into sugar. So please measure them out no more than 1/2-1 cup of rice, oatmeal, pasta, and beans.  Veggies are however free foods! Pile them on.   I like lean protein at every meal such as chicken, turkey, pork chops, cottage cheese, etc. Just do not fry these meats and please center your meal around vegetable, the meats should be a side dish.   No all fruit is created equal. Please see the list below, the fruit at the bottom is higher in sugars than the fruit at the top    

## 2013-08-27 NOTE — Progress Notes (Signed)
Subjective:    Patient ID: Todd Rice, male    DOB: 1955-07-06, 58 y.o.   MRN: [id number]  HPI Comments: 58 yo obese male presents for 3 month F/U for HTN, Cholesterol, DM, D. deficient He notes he is eating better and trying to lose weight. He has decreased starch and increased good protein. He is walking for exercise. He notes clothes fitting better. He has been off Phentermine x months.He notes BS and BP is good.  Last labs  CHOL         172   05/14/2013 HDL           42   05/14/2013 LDLCALC      100   05/14/2013 TRIG         151   05/14/2013 CHOLHDL      4.1   05/14/2013 ALT           49   05/14/2013 AST           36   05/14/2013 ALKPHOS       57   05/14/2013 BILITOT      0.6   05/14/2013 HGBA1C      7.2   05/14/2013 CREATININE     1.16   05/14/2013 BUN              19   05/14/2013 NA              139   05/14/2013 K               3.7   05/14/2013 CL              103   05/14/2013 CO2              28   05/14/2013 WBC      9.3   05/14/2013 HGB     15.7   05/14/2013 HCT     46.0   05/14/2013 MCV     85.5   05/14/2013 PLT      176   05/14/2013 He denies any urinary symptoms since stone was removed and has f/u at 6 month intervals with Dr Tammi Klippel for Kidney cancer surveillance.      Medication List       This list is accurate as of: 08/27/13  5:28 PM.  Always use your most recent med list.               albuterol 108 (90 BASE) MCG/ACT inhaler  Commonly known as:  PROVENTIL HFA;VENTOLIN HFA  Inhale 2 puffs into the lungs every 6 (six) hours as needed for wheezing or shortness of breath.     allopurinol 300 MG tablet  Commonly known as:  ZYLOPRIM  Take 300 mg by mouth daily.     ALPRAZolam 0.5 MG tablet  Commonly known as:  XANAX  TAKE 1 TABLET BY MOUTH 1 TO 3 TIMES DAILY     aspirin EC 81 MG tablet  Take 81 mg by mouth at bedtime.     colchicine 0.6 MG tablet  Take 0.6 mg by mouth 2 (two) times daily.     metFORMIN 500 MG tablet  Commonly known as:  GLUCOPHAGE  TAKE 1 TABLET BY MOUTH TWICE  DAILY     oxybutynin 5 MG tablet  Commonly known as:  DITROPAN  Take 1 tablet (5 mg total) by mouth every 8 (eight) hours as needed. For bladder spasms / stent discomfort     oxyCODONE-acetaminophen  5-325 MG per tablet  Commonly known as:  ROXICET  Take 1 tablet by mouth every 4 (four) hours as needed for pain. Post-operatively     phentermine 37.5 MG tablet  Commonly known as:  ADIPEX-P  Take 37.5 mg by mouth daily before breakfast.     PROBIOTIC DAILY PO  Take 1 tablet by mouth daily.     rosuvastatin 10 MG tablet  Commonly known as:  CRESTOR  Take 10 mg by mouth at bedtime.     valsartan-hydrochlorothiazide 320-25 MG per tablet  Commonly known as:  DIOVAN-HCT  Take 1 tablet by mouth every morning.     Vitamin D3 10000 UNITS capsule  Take 10,000 Units by mouth daily.       No Known Allergies  Past Medical History  Diagnosis Date  . Hypertension   . Right ureteral stone   . Right flank pain     DUE TO KIDNEY STONE  . H/O: gout     STABLE  . OSA on CPAP   . Adult BMI 50.0-59.9 kg/sq m     52.77  . Hepatic steatosis 10/01/11  . Anal fissure   . Vitamin D deficiency   . Anxiety   . Hyperlipidemia   . Diabetes mellitus without complication     borderline  . Kidney tumor     right upper kidney tumor, monitoring  . GERD (gastroesophageal reflux disease)     30 years ago, maybe from peptic ulcer  . History of kidney stones      Review of Systems  All other systems reviewed and are negative.  BP 128/76  Pulse 68  Temp(Src) 99.7 F (37.6 C)  Resp 18  Ht 5\' 7"  (1.702 m)  Wt 304 lb 9.6 oz (138.166 kg)  BMI 47.70 kg/m2 Temp 97.7 Oral    Objective:   Physical Exam  Nursing note and vitals reviewed. Constitutional: He is oriented to person, place, and time. He appears well-developed and well-nourished.  obese  HENT:  Head: Normocephalic and atraumatic.  Right Ear: External ear normal.  Left Ear: External ear normal.  Nose: Nose normal.  Eyes:  Conjunctivae and EOM are normal.  Neck: Normal range of motion. Neck supple. No JVD present. No thyromegaly present.  Cardiovascular: Normal rate, regular rhythm, normal heart sounds and intact distal pulses.   Pulmonary/Chest: Effort normal and breath sounds normal.  Abdominal: Soft. Bowel sounds are normal. He exhibits no distension and no mass. There is no tenderness. There is no rebound and no guarding.  Musculoskeletal: Normal range of motion. He exhibits no edema and no tenderness.  Lymphadenopathy:    He has no cervical adenopathy.  Neurological: He is alert and oriented to person, place, and time. He has normal reflexes. No cranial nerve deficit. Coordination normal.  Skin: Skin is warm and dry.  Psychiatric: He has a normal mood and affect. His behavior is normal. Judgment and thought content normal.          Assessment & Plan:  1.  3 month F/U for HTN, Cholesterol,DM, D. Deficient. Needs healthy diet, cardio QD and obtain healthy weight. Check Labs, Check BP if >130/80 call office, Check BS if >200 call office

## 2013-08-28 LAB — VITAMIN D 25 HYDROXY (VIT D DEFICIENCY, FRACTURES): Vit D, 25-Hydroxy: 34 ng/mL (ref 30–89)

## 2013-08-28 LAB — HEMOGLOBIN A1C
Hgb A1c MFr Bld: 6.2 % — ABNORMAL HIGH (ref <5.7)
MEAN PLASMA GLUCOSE: 131 mg/dL — AB (ref <117)

## 2013-08-28 LAB — INSULIN, FASTING: Insulin fasting, serum: 53 u[IU]/mL — ABNORMAL HIGH (ref 3–28)

## 2013-09-22 ENCOUNTER — Other Ambulatory Visit: Payer: Self-pay | Admitting: Emergency Medicine

## 2013-10-14 ENCOUNTER — Ambulatory Visit (INDEPENDENT_AMBULATORY_CARE_PROVIDER_SITE_OTHER): Payer: 59 | Admitting: Emergency Medicine

## 2013-10-14 ENCOUNTER — Encounter: Payer: Self-pay | Admitting: Emergency Medicine

## 2013-10-14 VITALS — BP 126/90 | HR 72 | Temp 98.4°F | Resp 18 | Ht 67.0 in | Wt 306.0 lb

## 2013-10-14 DIAGNOSIS — M549 Dorsalgia, unspecified: Secondary | ICD-10-CM

## 2013-10-14 MED ORDER — PREDNISONE 10 MG PO TABS: ORAL_TABLET | ORAL | Status: DC

## 2013-10-14 NOTE — Patient Instructions (Signed)

## 2013-10-14 NOTE — Progress Notes (Signed)
   Subjective:    Patient ID: Todd Rice, male    DOB: [birth year], 58 y.o.   MRN: [id number]  HPI Comments: 58 yo obese male with back pain. He notes back pain has been present most days and radiates down both legs. He notes worse after prolonged sitting, better with movement. At worse 10/10 but resolves quickly. No OTC trials.  He has kidney cancer that has f/u due next year. He notes urine has improved. He denies any injury or strain to back. Pain currently very mild.  Back Pain      Review of Systems  Musculoskeletal: Positive for back pain.  All other systems reviewed and are negative.  BP 126/90  Pulse 72  Temp(Src) 98.4 F (36.9 C) (Temporal)  Resp 18  Ht 5\' 7"  (1.702 m)  Wt 306 lb (138.801 kg)  BMI 47.92 kg/m2     Objective:   Physical Exam  Nursing note and vitals reviewed. Constitutional: He is oriented to person, place, and time. He appears well-developed and well-nourished.  HENT:  Head: Normocephalic and atraumatic.  Right Ear: External ear normal.  Left Ear: External ear normal.  Nose: Nose normal.  Eyes: Conjunctivae and EOM are normal.  Neck: Normal range of motion. Neck supple. No JVD present. No thyromegaly present.  Cardiovascular: Normal rate, regular rhythm, normal heart sounds and intact distal pulses.   Pulmonary/Chest: Effort normal and breath sounds normal.  Abdominal: Soft. Bowel sounds are normal. He exhibits no distension. There is no tenderness.  Musculoskeletal: He exhibits no edema and no tenderness.  Due to body habitus unable to lift legs fully with obese abdomen and unable to do stretches correctly, otherwise normal exam. He did note discomfort after trying to stretch in his legs.  Lymphadenopathy:    He has no cervical adenopathy.  Neurological: He is alert and oriented to person, place, and time. He has normal reflexes. No cranial nerve deficit. Coordination normal.  Skin: Skin is warm and dry.  Psychiatric: He has a normal mood  and affect. His behavior is normal. Judgment and thought content normal.          Assessment & Plan:  Back pain ? Sciatica vs obesity with poor posture/strain- Pred DP 10 mg AD, advised heat/stretch/ice, lumbar Xray with history of renal carcinoma. Decrease weight, increase activity

## 2013-10-15 ENCOUNTER — Ambulatory Visit (HOSPITAL_COMMUNITY)
Admission: RE | Admit: 2013-10-15 | Discharge: 2013-10-15 | Disposition: A | Discharge status: Home / Self Care | Payer: 59 | Source: Ambulatory Visit | Attending: Emergency Medicine | Admitting: Emergency Medicine

## 2013-10-15 DIAGNOSIS — M549 Dorsalgia, unspecified: Secondary | ICD-10-CM

## 2013-10-15 DIAGNOSIS — M545 Low back pain, unspecified: Secondary | ICD-10-CM | POA: Insufficient documentation

## 2013-11-05 ENCOUNTER — Other Ambulatory Visit: Payer: Self-pay | Admitting: Emergency Medicine

## 2013-11-05 ENCOUNTER — Ambulatory Visit (INDEPENDENT_AMBULATORY_CARE_PROVIDER_SITE_OTHER): Payer: 59 | Admitting: Emergency Medicine

## 2013-11-05 ENCOUNTER — Encounter: Payer: Self-pay | Admitting: Internal Medicine

## 2013-11-05 ENCOUNTER — Encounter: Payer: Self-pay | Admitting: Emergency Medicine

## 2013-11-05 VITALS — BP 132/86 | HR 64 | Temp 98.6°F | Resp 20 | Ht 67.0 in | Wt 308.0 lb

## 2013-11-05 DIAGNOSIS — E669 Obesity, unspecified: Secondary | ICD-10-CM

## 2013-11-05 DIAGNOSIS — M545 Low back pain, unspecified: Secondary | ICD-10-CM

## 2013-11-05 DIAGNOSIS — Z0000 Encounter for general adult medical examination without abnormal findings: Secondary | ICD-10-CM

## 2013-11-05 DIAGNOSIS — I10 Essential (primary) hypertension: Secondary | ICD-10-CM

## 2013-11-05 DIAGNOSIS — E782 Mixed hyperlipidemia: Secondary | ICD-10-CM

## 2013-11-05 LAB — CBC WITH DIFFERENTIAL/PLATELET
Basophils Absolute: 0 10*3/uL (ref 0.0–0.1)
Basophils Relative: 0 % (ref 0–1)
Eosinophils Absolute: 0.1 10*3/uL (ref 0.0–0.7)
Eosinophils Relative: 2 % (ref 0–5)
HEMATOCRIT: 45.3 % (ref 39.0–52.0)
HEMOGLOBIN: 15.7 g/dL (ref 13.0–17.0)
LYMPHS PCT: 34 % (ref 12–46)
Lymphs Abs: 1.9 10*3/uL (ref 0.7–4.0)
MCH: 28.2 pg (ref 26.0–34.0)
MCHC: 34.7 g/dL (ref 30.0–36.0)
MCV: 81.5 fL (ref 78.0–100.0)
MONO ABS: 0.5 10*3/uL (ref 0.1–1.0)
Monocytes Relative: 8 % (ref 3–12)
Neutro Abs: 3.2 10*3/uL (ref 1.7–7.7)
Neutrophils Relative %: 56 % (ref 43–77)
Platelets: 132 10*3/uL — ABNORMAL LOW (ref 150–400)
RBC: 5.56 MIL/uL (ref 4.22–5.81)
RDW: 14.5 % (ref 11.5–15.5)
WBC: 5.7 10*3/uL (ref 4.0–10.5)

## 2013-11-05 MED ORDER — ALLOPURINOL 300 MG PO TABS: ORAL_TABLET | ORAL | Status: DC

## 2013-11-05 MED ORDER — METFORMIN HCL 500 MG PO TABS: ORAL_TABLET | ORAL | Status: DC

## 2013-11-05 MED ORDER — ALBUTEROL SULFATE HFA 108 (90 BASE) MCG/ACT IN AERS
2.0000 | INHALATION_SPRAY | Freq: Four times a day (QID) | RESPIRATORY_TRACT | Status: DC | PRN
Start: 2013-11-05 — End: 2014-12-11

## 2013-11-05 MED ORDER — COLCHICINE 0.6 MG PO TABS: 0.6000 mg | ORAL_TABLET | Freq: Two times a day (BID) | ORAL | Status: DC

## 2013-11-05 MED ORDER — VALSARTAN-HYDROCHLOROTHIAZIDE 320-25 MG PO TABS: 1.0000 | ORAL_TABLET | ORAL | Status: DC

## 2013-11-05 MED ORDER — ROSUVASTATIN CALCIUM 10 MG PO TABS: 10.0000 mg | ORAL_TABLET | Freq: Every day | ORAL | Status: DC

## 2013-11-05 MED ORDER — ALPRAZOLAM 0.5 MG PO TABS: ORAL_TABLET | ORAL | Status: DC

## 2013-11-05 NOTE — Patient Instructions (Signed)
Back Injury Prevention The following tips can help you to prevent a back injury. PHYSICAL FITNESS  Exercise often. Try to develop strong stomach (abdominal) muscles.  Do aerobic exercises often. This includes walking, jogging, biking, swimming.  Do exercises that help with balance and strength often. This includes tai chi and yoga.  Stretch before and after you exercise.  Keep a healthy weight. DIET   Ask your doctor how much calcium and vitamin D you need every day.  Include calcium in your diet. Foods high in calcium include dairy products; green, leafy vegetables; and products with calcium added (fortified).  Include vitamin D in your diet. Foods high in vitamin D include milk and products with vitamin D added.  Think about taking a multivitamin or other nutritional products called " supplements."  Stop smoking if you smoke. POSTURE   Sit and stand up straight. Avoid leaning forward or hunching over.  Choose chairs that support your lower back.  If you work at a desk:  Sit close to your work so you do not lean over.  Keep your chin tucked in.  Keep your neck drawn back.  Keep your elbows bent at a right angle. Your arms should look like the letter "L."  Sit high and close to the steering wheel when you drive. Add low back support to your car seat if needed.  Avoid sitting or standing in one position for too long. Get up and move around every hour. Take breaks if you are driving for a long time.  Sleep on your side with your knees slightly bent. You can also sleep on your back with a pillow under your knees. Do not sleep on your stomach. LIFTING, TWISTING, AND REACHING  Avoid heavy lifting, especially lifting over and over again. If you must do heavy lifting:  Stretch before lifting.  Work slowly.  Rest between lifts.  Use carts and dollies to move objects when possible.  Make several small trips instead of carrying 1 heavy load.  Ask for help when you  need it.  Ask for help when moving big, awkward objects.  Follow these steps when lifting:  Stand with your feet shoulder-width apart.  Get as close to the object as you can. Do not pick up heavy objects that are far from your body.  Use handles or lifting straps when possible.  Bend at your knees. Squat down, but keep your heels off the floor.  Keep your shoulders back, your chin tucked in, and your back straight.  Lift the object slowly. Tighten the muscles in your legs, stomach, and butt. Keep the object as close to the center of your body as possible.  Reverse these directions when you put a load down.  Do not:  Lift the object above your waist.  Twist at the waist while lifting or carrying a load. Move your feet if you need to turn, not your waist.  Bend over without bending at your knees.  Avoid reaching over your head, across a table, or for an object on a high surface. OTHER TIPS  Avoid wet floors and keep sidewalks clear of ice.  Do not sleep on a mattress that is too soft or too hard.  Keep items that you use often within easy reach.  Put heavier objects on shelves at waist level. Put lighter objects on lower or higher shelves.  Find ways to lessen your stress. You can try exercise, massage, or relaxation.  Get help for depression or anxiety if  needed. GET HELP IF:  You injure your back.  You have questions about diet, exercise, or other ways to prevent back injuries. MAKE SURE YOU:  Understand these instructions.  Will watch your condition.  Will get help right away if you are not doing well or get worse. Document Released: 10/11/2007 Document Revised: 07/17/2011 Document Reviewed: 06/05/2011 [facility] [place] Patient Information 2015 [place], [place]. This information is not intended to replace advice given to you by your health care provider. Make sure you discuss any questions you have with your health care provider.

## 2013-11-05 NOTE — Progress Notes (Signed)
Subjective:    Patient ID: Todd Rice, male    DOB: [birth year], 58 y.o.   MRN: [id number]  HPI Comments: 58 yo OBese pleasant WM CPE. He had recent 3 Month f/u with labs below. He is trying to eat better and decrease weight. He notes recent prednisone has not helped with weight. He notes BP/ BS both good at home. He denies any CV complaints.  He is under continued monitoring by DR [facility] for right renal cell carcinoma with NEG DRE at 06/2013 OV and decreased tumor size on CT scan. He has next f/u 06/2014  He notes back pain had improved with Pred DP and then yesterday bent over and had increased pain. He notes sharp/ shooting pain has improved with OTC ADvil. He denies pain as significant as previous episode and he did have degenerative changes on xray at that visit.   WBC             8.0   08/27/2013 HGB            15.8   08/27/2013 HCT            44.8   08/27/2013 PLT             181   08/27/2013 GLUCOSE          87   08/27/2013 CHOL            180   08/27/2013 TRIG            187   08/27/2013 HDL              36   08/27/2013 LDLCALC         107   08/27/2013 ALT              28   08/27/2013 AST              24   08/27/2013 NA              138   08/27/2013 K               3.9   08/27/2013 CL              100   08/27/2013 CREATININE     1.26   08/27/2013 BUN              21   08/27/2013 CO2              28   08/27/2013 HGBA1C          6.2   08/27/2013   Back Pain      Medication List       This list is accurate as of: 11/05/13 10:27 AM.  Always use your most recent med list.               albuterol 108 (90 BASE) MCG/ACT inhaler  Commonly known as:  PROVENTIL HFA;VENTOLIN HFA  Inhale 2 puffs into the lungs every 6 (six) hours as needed for wheezing or shortness of breath.     allopurinol 300 MG tablet  Commonly known as:  ZYLOPRIM  TAKE 1 TABLET BY MOUTH ONCE DAILY     ALPRAZolam 0.5 MG tablet  Commonly known as:  XANAX  TAKE 1 TABLET BY MOUTH 1 TO 3 TIMES DAILY     aspirin EC  81 MG tablet  Take 81 mg by mouth at bedtime.     colchicine 0.6 MG tablet  Take 0.6 mg by mouth 2 (two) times daily.     metFORMIN 500 MG tablet  Commonly known as:  GLUCOPHAGE  TAKE 1 TABLET BY MOUTH TWICE DAILY     PROBIOTIC DAILY PO  Take 1 tablet by mouth daily.     rosuvastatin 10 MG tablet  Commonly known as:  CRESTOR  Take 10 mg by mouth at bedtime.     valsartan-hydrochlorothiazide 320-25 MG per tablet  Commonly known as:  DIOVAN-HCT  Take 1 tablet by mouth every morning.     Vitamin D3 10000 UNITS capsule  Take 10,000 Units by mouth daily.       No Known Allergies Past Medical History  Diagnosis Date  . Hypertension   . Right ureteral stone   . Right flank pain     DUE TO KIDNEY STONE  . H/O: gout     STABLE  . OSA on CPAP   . Adult BMI 50.0-59.9 kg/sq m     52.77  . Hepatic steatosis 10/01/11  . Anal fissure   . Vitamin D deficiency   . Anxiety   . Hyperlipidemia   . Diabetes mellitus without complication     borderline  . Kidney tumor     right upper kidney tumor, monitoring  . GERD (gastroesophageal reflux disease)     30 years ago, maybe from peptic ulcer  . History of kidney stones   . Cancer 11/05/12    REnal Cell Carcinoma? right   Past Surgical History  Procedure Laterality Date  . Anal fissure repair  10-12-2000  . Nasal septum surgery  1985  . Left medial femoral condyle debridement & drilling/ removal loose body  09-15-2003  . Stone extraction with basket  06/12/2011    Procedure: STONE EXTRACTION WITH BASKET;  Surgeon: Claybon Jabs, MD;  Location: [facility];  Service: Urology;  Laterality: Right;  . Kidney stone removal      06/2011-stent placement  . Colonoscopy N/A 07/01/2012    Procedure: COLONOSCOPY;  Surgeon: Lafayette Dragon, MD;  Location: [facility];  Service: Endoscopy;  Laterality: N/A;  . Cystoscopy with retrograde pyelogram, ureteroscopy and stent placement Bilateral 02/26/2013    Procedure: CYSTOSCOPY WITH  RETROGRADE PYELOGRAM, URETEROSCOPY AND STENT PLACEMENT;  Surgeon: Alexis Frock, MD;  Location: [facility];  Service: Urology;  Laterality: Bilateral;  . Holmium laser application Bilateral 16/02/9603    Procedure: HOLMIUM LASER APPLICATION;  Surgeon: Alexis Frock, MD;  Location: [facility];  Service: Urology;  Laterality: Bilateral;    History  Substance Use Topics  . Smoking status: Never Smoker   . Smokeless tobacco: Never Used  . Alcohol Use: No    Family History  Problem Relation Age of Onset  . Heart disease Mother     Atrial fibrillation  . Hypertension Mother   . Hyperlipidemia Mother   . Lung cancer Father     was a smoker  . Cancer Father     lung  . Hyperlipidemia Brother   . Heart disease Maternal Aunt   . Hyperlipidemia Maternal Aunt   . Hypertension Maternal Aunt   . Stroke Maternal Aunt   . Heart disease Paternal Grandmother   . Hyperlipidemia Paternal Grandmother     MAINTENANCE: Colonoscopy:07/01/12 due 2024 EYE:08/2013 [facility] Dentist: q 6 month  IMMUNIZATIONS: [id number] Pneumovax:11/05/12 Zostavax:n/a Influenza:2014   [name], [name], [name], MD as Consulting Physician (Urology) Sydell Axon  Andris Baumann, [name] Geroge Baseman, (Dentist)  Review of Systems  Musculoskeletal: Positive for back pain.  All other systems reviewed and are negative.  BP 132/86  Pulse 64  Temp(Src) 98.6 F (37 C)  Resp 20  Ht 5\' 7"  (1.702 m)  Wt 308 lb (139.708 kg)  BMI 48.23 kg/m2     Objective:   Physical Exam  Nursing note and vitals reviewed. Constitutional: He is oriented to person, place, and time. He appears well-developed and well-nourished.  Obese   HENT:  Head: Normocephalic and atraumatic.  Right Ear: External ear normal.  Left Ear: External ear normal.  Nose: Nose normal.  Mouth/Throat: Oropharynx is clear and moist.  Eyes:  Conjunctivae and EOM are normal. Pupils are equal, round, and reactive to light. Right eye exhibits no discharge. Left eye exhibits no discharge. No scleral icterus.  Neck: Normal range of motion. Neck supple. No JVD present. No tracheal deviation present. No thyromegaly present.  Cardiovascular: Normal rate, regular rhythm, normal heart sounds and intact distal pulses.   Pulmonary/Chest: Effort normal and breath sounds normal.  Abdominal: Soft. Bowel sounds are normal. He exhibits no distension and no mass. There is no tenderness. There is no rebound and no guarding.  Musculoskeletal: Normal range of motion. He exhibits tenderness. He exhibits no edema.  Minimal with change of position right L1-3  Lymphadenopathy:    He has no cervical adenopathy.  Neurological: He is alert and oriented to person, place, and time. He has normal reflexes. No cranial nerve deficit. He exhibits normal muscle tone. Coordination normal.  Skin: Skin is warm and dry. No rash noted. No erythema. No pallor.  Scaling/ calluses edges of both feet  Psychiatric: He has a normal mood and affect. His behavior is normal. Judgment and thought content normal.         AORTA SCAN WNL EKG NSCSPT  Assessment & Plan:  1. CPE- Update screening labs/ History/ Immunizations/ Testing as needed. Advised healthy diet, QD exercise, increase H20 and continue RX/ Vitamins AD.  2. Back pain- If continues needs Ortho refer  3. Obesity- Continue weight loss, increase activity and better diet. Pt aware of risks. Check labs   4. Callus/ Bunion/ dry skin DM- Advised needs podiatry evaluation if no change with  DM Hx, epsom salt soaks, vaseline treatment QD, monitor QD and call with any concerns/ adverse changes

## 2013-11-06 LAB — BASIC METABOLIC PANEL WITH GFR
BUN: 23 mg/dL (ref 6–23)
CO2: 27 mEq/L (ref 19–32)
Calcium: 9.7 mg/dL (ref 8.4–10.5)
Chloride: 102 mEq/L (ref 96–112)
Creat: 1.31 mg/dL (ref 0.50–1.35)
GFR, Est African American: 68 mL/min
GFR, Est Non African American: 59 mL/min — ABNORMAL LOW
Glucose, Bld: 104 mg/dL — ABNORMAL HIGH (ref 70–99)
Potassium: 4.2 mEq/L (ref 3.5–5.3)
SODIUM: 139 meq/L (ref 135–145)

## 2013-11-06 LAB — MICROALBUMIN / CREATININE URINE RATIO
Creatinine, Urine: 222.2 mg/dL
Microalb Creat Ratio: 4.8 mg/g (ref 0.0–30.0)
Microalb, Ur: 1.07 mg/dL (ref 0.00–1.89)

## 2013-11-06 LAB — URINALYSIS, ROUTINE W REFLEX MICROSCOPIC
Bilirubin Urine: NEGATIVE
Glucose, UA: NEGATIVE mg/dL
Hgb urine dipstick: NEGATIVE
KETONES UR: NEGATIVE mg/dL
NITRITE: NEGATIVE
PROTEIN: NEGATIVE mg/dL
SPECIFIC GRAVITY, URINE: 1.021 (ref 1.005–1.030)
UROBILINOGEN UA: 0.2 mg/dL (ref 0.0–1.0)
pH: 5.5 (ref 5.0–8.0)

## 2013-11-06 LAB — TSH: TSH: 1.88 u[IU]/mL (ref 0.350–4.500)

## 2013-11-06 LAB — URINALYSIS, MICROSCOPIC ONLY
CASTS: NONE SEEN
CRYSTALS: NONE SEEN
Squamous Epithelial / LPF: NONE SEEN

## 2013-11-06 LAB — HEPATIC FUNCTION PANEL
ALBUMIN: 3.9 g/dL (ref 3.5–5.2)
ALT: 36 U/L (ref 0–53)
AST: 27 U/L (ref 0–37)
Alkaline Phosphatase: 50 U/L (ref 39–117)
BILIRUBIN TOTAL: 0.8 mg/dL (ref 0.2–1.2)
Bilirubin, Direct: 0.1 mg/dL (ref 0.0–0.3)
Indirect Bilirubin: 0.7 mg/dL (ref 0.2–1.2)
Total Protein: 7.3 g/dL (ref 6.0–8.3)

## 2013-11-06 LAB — INSULIN, FASTING: Insulin fasting, serum: 51 u[IU]/mL — ABNORMAL HIGH (ref 3–28)

## 2013-11-06 LAB — MAGNESIUM: MAGNESIUM: 1.6 mg/dL (ref 1.5–2.5)

## 2013-11-06 LAB — TESTOSTERONE: Testosterone: 489 ng/dL (ref 300–890)

## 2013-11-06 LAB — VITAMIN D 25 HYDROXY (VIT D DEFICIENCY, FRACTURES): Vit D, 25-Hydroxy: 36 ng/mL (ref 30–89)

## 2013-11-06 LAB — URIC ACID: URIC ACID, SERUM: 6.3 mg/dL (ref 4.0–7.8)

## 2013-11-06 LAB — PSA: PSA: 2.12 ng/mL (ref <=4.00)

## 2013-11-08 LAB — URINE CULTURE: Colony Count: >=100000

## 2013-12-10 ENCOUNTER — Other Ambulatory Visit: Payer: Self-pay | Admitting: Internal Medicine

## 2014-01-13 ENCOUNTER — Telehealth: Payer: Self-pay | Admitting: Internal Medicine

## 2014-01-13 NOTE — Telephone Encounter (Signed)
PT HAD ANOTHER DR EVAL...DEEP BRUSIE, PER PT   Thank you, Katrina [facility] [phone] Fax [phone]

## 2014-01-21 ENCOUNTER — Other Ambulatory Visit: Payer: Self-pay | Admitting: Physician Assistant

## 2014-02-05 ENCOUNTER — Encounter: Payer: Self-pay | Admitting: Physician Assistant

## 2014-02-05 ENCOUNTER — Ambulatory Visit (INDEPENDENT_AMBULATORY_CARE_PROVIDER_SITE_OTHER): Payer: 59 | Admitting: Physician Assistant

## 2014-02-05 VITALS — BP 118/64 | HR 86 | Temp 98.2°F | Resp 18 | Ht 67.0 in | Wt 310.0 lb

## 2014-02-05 DIAGNOSIS — IMO0002 Reserved for concepts with insufficient information to code with codable children: Secondary | ICD-10-CM

## 2014-02-05 DIAGNOSIS — E1329 Other specified diabetes mellitus with other diabetic kidney complication: Secondary | ICD-10-CM

## 2014-02-05 DIAGNOSIS — I10 Essential (primary) hypertension: Secondary | ICD-10-CM

## 2014-02-05 DIAGNOSIS — E559 Vitamin D deficiency, unspecified: Secondary | ICD-10-CM

## 2014-02-05 DIAGNOSIS — E785 Hyperlipidemia, unspecified: Secondary | ICD-10-CM

## 2014-02-05 DIAGNOSIS — N182 Chronic kidney disease, stage 2 (mild): Secondary | ICD-10-CM

## 2014-02-05 DIAGNOSIS — E669 Obesity, unspecified: Secondary | ICD-10-CM

## 2014-02-05 DIAGNOSIS — E1365 Other specified diabetes mellitus with hyperglycemia: Secondary | ICD-10-CM

## 2014-02-05 DIAGNOSIS — Z5181 Encounter for therapeutic drug level monitoring: Secondary | ICD-10-CM

## 2014-02-05 DIAGNOSIS — E1322 Other specified diabetes mellitus with diabetic chronic kidney disease: Secondary | ICD-10-CM

## 2014-02-05 DIAGNOSIS — I2510 Atherosclerotic heart disease of native coronary artery without angina pectoris: Secondary | ICD-10-CM

## 2014-02-05 LAB — CBC WITH DIFFERENTIAL/PLATELET
BASOS ABS: 0 10*3/uL (ref 0.0–0.1)
Basophils Relative: 0 % (ref 0–1)
EOS PCT: 2 % (ref 0–5)
Eosinophils Absolute: 0.1 10*3/uL (ref 0.0–0.7)
HCT: 45.9 % (ref 39.0–52.0)
Hemoglobin: 15.5 g/dL (ref 13.0–17.0)
LYMPHS ABS: 2 10*3/uL (ref 0.7–4.0)
LYMPHS PCT: 32 % (ref 12–46)
MCH: 27.9 pg (ref 26.0–34.0)
MCHC: 33.8 g/dL (ref 30.0–36.0)
MCV: 82.7 fL (ref 78.0–100.0)
Monocytes Absolute: 0.7 10*3/uL (ref 0.1–1.0)
Monocytes Relative: 11 % (ref 3–12)
NEUTROS ABS: 3.4 10*3/uL (ref 1.7–7.7)
Neutrophils Relative %: 55 % (ref 43–77)
Platelets: 139 10*3/uL — ABNORMAL LOW (ref 150–400)
RBC: 5.55 MIL/uL (ref 4.22–5.81)
RDW: 14.5 % (ref 11.5–15.5)
WBC: 6.1 10*3/uL (ref 4.0–10.5)

## 2014-02-05 NOTE — Patient Instructions (Signed)
   Bad carbs also include fruit juice, alcohol, and sweet tea. These are empty calories that do not signal to your brain that you are full.   Please remember the good carbs are still carbs which convert into sugar. So please measure them out no more than 1/2-1 cup of rice, oatmeal, pasta, and beans.  Veggies are however free foods! Pile them on.   I like lean protein at every meal such as chicken, turkey, pork chops, cottage cheese, etc. Just do not fry these meats and please center your meal around vegetable, the meats should be a side dish.   No all fruit is created equal. Please see the list below, the fruit at the bottom is higher in sugars than the fruit at the top   Recommendations For Diabetic Patients:   -  Take medications as prescribed  -  Recommend Dr Joel Fuhrman's book "The End of Diabetes " - Can get at  www.Amazon.com and encourage also get the Audio CD book  - AVOID Animal products, ie. Meat - red/white, Poultry and Dairy/especially cheese - Exercise at least 5 times a week for 30 minutes or preferably daily.  - No Smoking - Drink less than 2 drinks a day.  - Monitor your feet for sores - Have yearly Eye Exams - Recommend annual Flu vaccine  - Recommend Pneumovax and Prevnar vaccines - Shingles Vaccine (Zostavax) if over 60 y.o.  Goals:   - BMI less than 24 - Fasting sugar less than 130 or less than 150 if tapering medicines to lose weight  - Systolic BP less than 130  - Diastolic BP less than 80 - Bad LDL Cholesterol less than 70 - Triglycerides less than 150  

## 2014-02-05 NOTE — Progress Notes (Signed)
Assessment and Plan:  Hypertension: Continue medication, monitor blood pressure at home. Continue DASH diet. Cholesterol: Continue diet and exercise. Check cholesterol.  Pre-diabetes-Continue diet and exercise. Check A1C Vitamin D Def- check level and continue medications.  Obesity with co morbidities- long discussion about weight loss, diet, and exercise   Continue diet and meds as discussed. Further disposition pending results of labs.  HPI 58 y.o. male  presents for 3 month follow up with hypertension, hyperlipidemia, prediabetes and vitamin D. His blood pressure has been controlled at home, today their BP is BP: 118/64 mmHg He does workout, he walks. He denies chest pain, shortness of breath, dizziness. He did drop a box on his right leg that caused extensive hematoma, still with hematoma/no pain.  He is on cholesterol medication and denies myalgias. His cholesterol is at goal. The cholesterol last visit was:   Lab Results  Component Value Date   CHOL 180 08/27/2013   HDL 36* 08/27/2013   LDLCALC 107* 08/27/2013   TRIG 187* 08/27/2013   CHOLHDL 5.0 08/27/2013   He has been working on diet and exercise for prediabetes, and denies paresthesia of the feet, polydipsia and polyuria. Last A1C in the office was:  Lab Results  Component Value Date   HGBA1C 6.2* 08/27/2013   Patient is on Vitamin D supplement.   Lab Results  Component Value Date   VD25OH 36 11/05/2013     BMI is Body mass index is 48.54 kg/(m^2)., he is working on diet and exercise. He has OSA and is on CPAP.  Wt Readings from Last 3 Encounters:  02/05/14 310 lb (140.615 kg)  11/05/13 308 lb (139.708 kg)  10/14/13 306 lb (138.801 kg)     Current Medications:  Current Outpatient Prescriptions on File Prior to Visit  Medication Sig Dispense Refill  . albuterol (PROVENTIL HFA;VENTOLIN HFA) 108 (90 BASE) MCG/ACT inhaler Inhale 2 puffs into the lungs every 6 (six) hours as needed for wheezing or shortness of breath.  18 g   3  . ALPRAZolam (XANAX) 0.5 MG tablet TAKE 1 TABLET BY MOUTH 1 TO 3 TIMES DAILY  90 tablet  1  . aspirin EC 81 MG tablet Take 81 mg by mouth at bedtime.      . Cholecalciferol (VITAMIN D3) 10000 UNITS capsule Take 10,000 Units by mouth daily.      . colchicine 0.6 MG tablet Take 1 tablet (0.6 mg total) by mouth 2 (two) times daily.  60 tablet  3  . metFORMIN (GLUCOPHAGE) 500 MG tablet TAKE 1 TABLET BY MOUTH TWICE DAILY  60 tablet  3  . Probiotic Product (PROBIOTIC DAILY PO) Take 1 tablet by mouth daily.      . rosuvastatin (CRESTOR) 10 MG tablet Take 1 tablet (10 mg total) by mouth at bedtime.  30 tablet  3  . valsartan-hydrochlorothiazide (DIOVAN-HCT) 320-25 MG per tablet TAKE 1 TABLET BY MOUTH ONCE DAILY  90 tablet  2   No current facility-administered medications on file prior to visit.   Medical History:  Past Medical History  Diagnosis Date  . Hypertension   . Right ureteral stone   . H/O: gout     STABLE  . OSA on CPAP   . Adult BMI 50.0-59.9 kg/sq m     52.77  . Hepatic steatosis 10/01/11  . Anal fissure   . Vitamin D deficiency   . Anxiety   . Hyperlipidemia   . Diabetes mellitus without complication     borderline  .  Kidney tumor     right upper kidney tumor, monitoring  . GERD (gastroesophageal reflux disease)     30 years ago, maybe from peptic ulcer  . History of kidney stones    Allergies: No Known Allergies   Review of Systems: [X]  = complains of  [ ]  = denies  General: Fatigue [ ]  Fever [ ]  Chills [ ]  Weakness [ ]   Insomnia [ ]  Eyes: Redness [ ]  Blurred vision [ ]  Diplopia [ ]   ENT: Congestion [ ]  Sinus Pain [ ]  Post Nasal Drip [ ]  Sore Throat [ ]  Earache [ ]   Cardiac: Chest pain/pressure [ ]  SOB [ ]  Orthopnea [ ]   Palpitations [ ]   Paroxysmal nocturnal dyspnea[ ]  Claudication [ ]  Edema [ ]   Pulmonary: Cough [ ]  Wheezing[ ]   SOB [ ]   Snoring [ ]   GI: Nausea [ ]  Vomiting[ ]  Dysphagia[ ]  Heartburn[ ]  Abdominal pain [ ]  Constipation [ ] ; Diarrhea [ ] ; BRBPR [ ]   Melena[ ]  GU: Hematuria[ ]  Dysuria [ ]  Nocturia[ ]  Urgency [ ]   Hesitancy [ ]  Discharge [ ]  Neuro: Headaches[ ]  Vertigo[ ]  Paresthesias[ ]  Spasm [ ]  Speech changes [ ]  Incoordination [ ]   Ortho: Arthritis [ ]  Joint pain [ ]  Muscle pain [ ]  Joint swelling [ ]  Back Pain [ ]  Skin:  Rash [ ]   Pruritis [ ]  Change in skin lesion [ ]   Psych: Depression[ ]  Anxiety[ ]  Confusion [ ]  Memory loss [ ]   Heme/Lypmh: Bleeding [ ]  Bruising [ ]  Enlarged lymph nodes [ ]   Endocrine: Visual blurring [ ]  Paresthesia [ ]  Polyuria [ ]  Polydypsea [ ]    Heat/cold intolerance [ ]  Hypoglycemia [ ]   Family history- Review and unchanged Social history- Review and unchanged Physical Exam: BP 118/64  Pulse 86  Temp(Src) 98.2 F (36.8 C) (Temporal)  Resp 18  Ht 5\' 7"  (1.702 m)  Wt 310 lb (140.615 kg)  BMI 48.54 kg/m2 Wt Readings from Last 3 Encounters:  02/05/14 310 lb (140.615 kg)  11/05/13 308 lb (139.708 kg)  10/14/13 306 lb (138.801 kg)   General Appearance: Well nourished, in no apparent distress. Eyes: PERRLA, EOMs, conjunctiva no swelling or erythema Sinuses: No Frontal/maxillary tenderness ENT/Mouth: Ext aud canals clear, TMs without erythema, bulging. No erythema, swelling, or exudate on post pharynx.  Tonsils not swollen or erythematous. Hearing normal.  Neck: Supple, thyroid normal.  Respiratory: Respiratory effort normal, BS equal bilaterally without rales, rhonchi, wheezing or stridor.  Cardio: RRR with no MRGs. Brisk peripheral pulses without edema.  Abdomen: Soft, + BS.  Non tender, no guarding, rebound, hernias, masses. Lymphatics: Non tender without lymphadenopathy.  Musculoskeletal: Full ROM, 5/5 strength, normal gait.  Skin: Warm, dry without rashes, lesions, ecchymosis.  Neuro: Cranial nerves intact. Normal muscle tone, no cerebellar symptoms. Sensation intact.  Psych: Awake and oriented X 3, normal affect, Insight and Judgment appropriate.    Vicie Mutters 3:12 PM

## 2014-02-06 LAB — BASIC METABOLIC PANEL WITH GFR
BUN: 24 mg/dL — AB (ref 6–23)
CHLORIDE: 101 meq/L (ref 96–112)
CO2: 26 mEq/L (ref 19–32)
Calcium: 9.8 mg/dL (ref 8.4–10.5)
Creat: 1.27 mg/dL (ref 0.50–1.35)
GFR, EST NON AFRICAN AMERICAN: 61 mL/min
GFR, Est African American: 71 mL/min
GLUCOSE: 141 mg/dL — AB (ref 70–99)
POTASSIUM: 3.8 meq/L (ref 3.5–5.3)
Sodium: 139 mEq/L (ref 135–145)

## 2014-02-06 LAB — LIPID PANEL
Cholesterol: 169 mg/dL (ref 0–200)
HDL: 35 mg/dL — AB (ref >39)
LDL CALC: 66 mg/dL (ref 0–99)
Total CHOL/HDL Ratio: 4.8 Ratio
Triglycerides: 341 mg/dL — ABNORMAL HIGH (ref <150)
VLDL: 68 mg/dL — ABNORMAL HIGH (ref 0–40)

## 2014-02-06 LAB — HEPATIC FUNCTION PANEL
ALT: 25 U/L (ref 0–53)
AST: 24 U/L (ref 0–37)
Albumin: 4.1 g/dL (ref 3.5–5.2)
Alkaline Phosphatase: 57 U/L (ref 39–117)
BILIRUBIN INDIRECT: 0.4 mg/dL (ref 0.2–1.2)
Bilirubin, Direct: 0.1 mg/dL (ref 0.0–0.3)
TOTAL PROTEIN: 7.6 g/dL (ref 6.0–8.3)
Total Bilirubin: 0.5 mg/dL (ref 0.2–1.2)

## 2014-02-06 LAB — HEMOGLOBIN A1C
Hgb A1c MFr Bld: 6.6 % — ABNORMAL HIGH (ref <5.7)
Mean Plasma Glucose: 143 mg/dL — ABNORMAL HIGH (ref <117)

## 2014-02-06 LAB — TSH: TSH: 2.366 u[IU]/mL (ref 0.350–4.500)

## 2014-02-06 LAB — INSULIN, FASTING: INSULIN FASTING, SERUM: 88.9 u[IU]/mL — AB (ref 2.0–19.6)

## 2014-02-06 LAB — MAGNESIUM: Magnesium: 1.7 mg/dL (ref 1.5–2.5)

## 2014-02-06 LAB — VITAMIN D 25 HYDROXY (VIT D DEFICIENCY, FRACTURES): Vit D, 25-Hydroxy: 38 ng/mL (ref 30–89)

## 2014-03-17 ENCOUNTER — Other Ambulatory Visit: Payer: Self-pay | Admitting: Internal Medicine

## 2014-03-27 ENCOUNTER — Ambulatory Visit (INDEPENDENT_AMBULATORY_CARE_PROVIDER_SITE_OTHER): Payer: 59 | Admitting: Emergency Medicine

## 2014-03-27 ENCOUNTER — Encounter: Payer: Self-pay | Admitting: Emergency Medicine

## 2014-03-27 VITALS — BP 126/74 | HR 77 | Temp 98.2°F | Resp 18 | Wt 317.0 lb

## 2014-03-27 DIAGNOSIS — J302 Other seasonal allergic rhinitis: Secondary | ICD-10-CM

## 2014-03-27 DIAGNOSIS — J4531 Mild persistent asthma with (acute) exacerbation: Secondary | ICD-10-CM

## 2014-03-27 MED ORDER — MONTELUKAST SODIUM 10 MG PO TABS: 10.0000 mg | ORAL_TABLET | Freq: Every day | ORAL | Status: DC

## 2014-03-27 MED ORDER — EPINEPHRINE 0.3 MG/0.3ML IJ SOAJ: 0.3000 mg | Freq: Once | INTRAMUSCULAR | Status: DC

## 2014-03-27 NOTE — Patient Instructions (Signed)

## 2014-03-27 NOTE — Progress Notes (Signed)
Subjective:    Patient ID: Todd Rice, male    DOB: [birth year], 58 y.o.   MRN: [id number]  HPI Comments: 59 yo WM with increased allergy and asthma symptoms over last several months. He wants referral to allergist. He is not working on weight or walking as much.He notes throat feels like closing and he coughs a few times and then opens up or will use inhaler and helps. He has been using inhaler a little more but at least twice daily. He has had increased phlegm production.  Asthma He complains of cough and wheezing. His past medical history is significant for asthma.      Medication List       This list is accurate as of: 03/27/14 11:33 AM.  Always use your most recent med list.               albuterol 108 (90 BASE) MCG/ACT inhaler  Commonly known as:  PROVENTIL HFA;VENTOLIN HFA  Inhale 2 puffs into the lungs every 6 (six) hours as needed for wheezing or shortness of breath.     allopurinol 300 MG tablet  Commonly known as:  ZYLOPRIM  Take 300 mg by mouth daily. TAKE 1 TABLET BY MOUTH ONCE DAILY     ALPRAZolam 0.5 MG tablet  Commonly known as:  XANAX  TAKE 1 TABLET BY MOUTH 1 TO 3 TIMES DAILY     aspirin EC 81 MG tablet  Take 81 mg by mouth at bedtime.     COLCRYS 0.6 MG tablet  Generic drug:  colchicine  TAKE 1 TABLET BY MOUTH TWICE DAILY     metFORMIN 500 MG tablet  Commonly known as:  GLUCOPHAGE  TAKE 1 TABLET BY MOUTH TWICE DAILY     PROBIOTIC DAILY PO  Take 1 tablet by mouth daily.     rosuvastatin 10 MG tablet  Commonly known as:  CRESTOR  Take 1 tablet (10 mg total) by mouth at bedtime.     valsartan-hydrochlorothiazide 320-25 MG per tablet  Commonly known as:  DIOVAN-HCT  TAKE 1 TABLET BY MOUTH ONCE DAILY     Vitamin D3 10000 UNITS capsule  Take 10,000 Units by mouth daily.        No Known Allergies   Past Medical History  Diagnosis Date  . Hypertension   . Right ureteral stone   . H/O: gout     STABLE  . OSA on CPAP   . Adult BMI  50.0-59.9 kg/sq m     52.77  . Hepatic steatosis 10/01/11  . Anal fissure   . Vitamin D deficiency   . Anxiety   . Hyperlipidemia   . Diabetes mellitus without complication     borderline  . Kidney tumor     right upper kidney tumor, monitoring  . GERD (gastroesophageal reflux disease)     30 years ago, maybe from peptic ulcer  . History of kidney stones      Review of Systems  Respiratory: Positive for cough and wheezing.   All other systems reviewed and are negative.  BP 126/74 mmHg  Pulse 77  Temp(Src) 98.2 F (36.8 C)  Resp 18  Wt 317 lb (143.79 kg)  SpO2 96%     Objective:   Physical Exam  Constitutional: He is oriented to person, place, and time. He appears well-developed and well-nourished.  obese  HENT:  Head: Normocephalic and atraumatic.  Right Ear: External ear normal.  Left Ear: External ear normal.  Nose:  Nose normal.  Mouth/Throat: Oropharynx is clear and moist. No oropharyngeal exudate.  Eyes: Conjunctivae are normal.  Neck: Normal range of motion.  Cardiovascular: Normal rate, regular rhythm, normal heart sounds and intact distal pulses.   Pulmonary/Chest: Effort normal. He has wheezes.  Clears with cough  Abdominal: Soft. There is no tenderness.  Musculoskeletal: Normal range of motion.  Lymphadenopathy:    He has no cervical adenopathy.  Neurological: He is alert and oriented to person, place, and time.  Skin: Skin is warm and dry. No rash noted.  Psychiatric: He has a normal mood and affect. Judgment normal.  Nursing note and vitals reviewed.       Assessment & Plan:  1. Asthma vs Allergic rhinitis-Referral to Allergy/ pulmonology- may try Singulair AD or Allegra OTC, increase H2o, allergy hygiene explained. Continue inhaler use AD. ADvised could be reflux induced as well patient will continue to monitor.

## 2014-03-30 ENCOUNTER — Ambulatory Visit (HOSPITAL_COMMUNITY)
Admission: RE | Admit: 2014-03-30 | Discharge: 2014-03-30 | Disposition: A | Discharge status: Home / Self Care | Payer: 59 | Source: Ambulatory Visit | Attending: Emergency Medicine | Admitting: Emergency Medicine

## 2014-03-30 DIAGNOSIS — J45901 Unspecified asthma with (acute) exacerbation: Secondary | ICD-10-CM | POA: Diagnosis present

## 2014-03-30 DIAGNOSIS — R05 Cough: Secondary | ICD-10-CM | POA: Insufficient documentation

## 2014-03-30 DIAGNOSIS — J4531 Mild persistent asthma with (acute) exacerbation: Secondary | ICD-10-CM

## 2014-05-22 ENCOUNTER — Institutional Professional Consult (permissible substitution): Payer: 59 | Admitting: Internal Medicine

## 2014-05-22 ENCOUNTER — Encounter: Payer: Self-pay | Admitting: Emergency Medicine

## 2014-05-22 ENCOUNTER — Ambulatory Visit (INDEPENDENT_AMBULATORY_CARE_PROVIDER_SITE_OTHER): Payer: 59 | Admitting: Emergency Medicine

## 2014-05-22 VITALS — BP 138/80 | HR 78 | Temp 98.6°F | Resp 18 | Ht 67.0 in | Wt 314.0 lb

## 2014-05-22 DIAGNOSIS — E782 Mixed hyperlipidemia: Secondary | ICD-10-CM

## 2014-05-22 DIAGNOSIS — E119 Type 2 diabetes mellitus without complications: Secondary | ICD-10-CM

## 2014-05-22 DIAGNOSIS — I10 Essential (primary) hypertension: Secondary | ICD-10-CM

## 2014-05-22 LAB — CBC WITH DIFFERENTIAL/PLATELET
BASOS ABS: 0 10*3/uL (ref 0.0–0.1)
BASOS PCT: 0 % (ref 0–1)
EOS PCT: 1 % (ref 0–5)
Eosinophils Absolute: 0.1 10*3/uL (ref 0.0–0.7)
HCT: 44.2 % (ref 39.0–52.0)
Hemoglobin: 15.3 g/dL (ref 13.0–17.0)
LYMPHS ABS: 2.3 10*3/uL (ref 0.7–4.0)
Lymphocytes Relative: 30 % (ref 12–46)
MCH: 28.6 pg (ref 26.0–34.0)
MCHC: 34.6 g/dL (ref 30.0–36.0)
MCV: 82.6 fL (ref 78.0–100.0)
MPV: 10.2 fL (ref 8.6–12.4)
Monocytes Absolute: 0.6 10*3/uL (ref 0.1–1.0)
Monocytes Relative: 8 % (ref 3–12)
Neutro Abs: 4.7 10*3/uL (ref 1.7–7.7)
Neutrophils Relative %: 61 % (ref 43–77)
Platelets: 156 10*3/uL (ref 150–400)
RBC: 5.35 MIL/uL (ref 4.22–5.81)
RDW: 14.5 % (ref 11.5–15.5)
WBC: 7.7 10*3/uL (ref 4.0–10.5)

## 2014-05-22 LAB — BASIC METABOLIC PANEL WITH GFR
BUN: 22 mg/dL (ref 6–23)
CO2: 25 mEq/L (ref 19–32)
Calcium: 9.9 mg/dL (ref 8.4–10.5)
Chloride: 101 mEq/L (ref 96–112)
Creat: 1.2 mg/dL (ref 0.50–1.35)
GFR, EST NON AFRICAN AMERICAN: 66 mL/min
GFR, Est African American: 76 mL/min
Glucose, Bld: 109 mg/dL — ABNORMAL HIGH (ref 70–99)
POTASSIUM: 4.2 meq/L (ref 3.5–5.3)
SODIUM: 139 meq/L (ref 135–145)

## 2014-05-22 LAB — HEPATIC FUNCTION PANEL
ALBUMIN: 3.9 g/dL (ref 3.5–5.2)
ALK PHOS: 60 U/L (ref 39–117)
ALT: 22 U/L (ref 0–53)
AST: 19 U/L (ref 0–37)
Bilirubin, Direct: 0.1 mg/dL (ref 0.0–0.3)
Indirect Bilirubin: 0.5 mg/dL (ref 0.2–1.2)
Total Bilirubin: 0.6 mg/dL (ref 0.2–1.2)
Total Protein: 7.5 g/dL (ref 6.0–8.3)

## 2014-05-22 LAB — LIPID PANEL
Cholesterol: 194 mg/dL (ref 0–200)
HDL: 35 mg/dL — ABNORMAL LOW (ref >39)
LDL CALC: 124 mg/dL — AB (ref 0–99)
TRIGLYCERIDES: 175 mg/dL — AB (ref <150)
Total CHOL/HDL Ratio: 5.5 Ratio
VLDL: 35 mg/dL (ref 0–40)

## 2014-05-22 LAB — HEMOGLOBIN A1C
Hgb A1c MFr Bld: 6.5 % — ABNORMAL HIGH (ref <5.7)
Mean Plasma Glucose: 140 mg/dL — ABNORMAL HIGH (ref <117)

## 2014-05-22 NOTE — Patient Instructions (Signed)
Diabetes and Exercise Exercising regularly is important. It is not just about losing weight. It has many health benefits, such as:  Improving your overall fitness, flexibility, and endurance.  Increasing your bone density.  Helping with weight control.  Decreasing your body fat.  Increasing your muscle strength.  Reducing stress and tension.  Improving your overall health. People with diabetes who exercise gain additional benefits because exercise:  Reduces appetite.  Improves the body's use of blood sugar (glucose).  Helps lower or control blood glucose.  Decreases blood pressure.  Helps control blood lipids (such as cholesterol and triglycerides).  Improves the body's use of the hormone insulin by:  Increasing the body's insulin sensitivity.  Reducing the body's insulin needs.  Decreases the risk for heart disease because exercising:  Lowers cholesterol and triglycerides levels.  Increases the levels of good cholesterol (such as high-density lipoproteins [HDL]) in the body.  Lowers blood glucose levels. YOUR ACTIVITY PLAN  Choose an activity that you enjoy and set realistic goals. Your health care provider or diabetes educator can help you make an activity plan that works for you. Exercise regularly as directed by your health care provider. This includes:  Performing resistance training twice a week such as push-ups, sit-ups, lifting weights, or using resistance bands.  Performing 150 minutes of cardio exercises each week such as walking, running, or playing sports.  Staying active and spending no more than 90 minutes at one time being inactive. Even short bursts of exercise are good for you. Three 10-minute sessions spread throughout the day are just as beneficial as a single 30-minute session. Some exercise ideas include:  Taking the dog for a walk.  Taking the stairs instead of the elevator.  Dancing to your favorite song.  Doing an exercise  video.  Doing your favorite exercise with a friend. RECOMMENDATIONS FOR EXERCISING WITH TYPE 1 OR TYPE 2 DIABETES   Check your blood glucose before exercising. If blood glucose levels are greater than 240 mg/dL, check for urine ketones. Do not exercise if ketones are present.  Avoid injecting insulin into areas of the body that are going to be exercised. For example, avoid injecting insulin into:  The arms when playing tennis.  The legs when jogging.  Keep a record of:  Food intake before and after you exercise.  Expected peak times of insulin action.  Blood glucose levels before and after you exercise.  The type and amount of exercise you have done.  Review your records with your health care provider. Your health care provider will help you to develop guidelines for adjusting food intake and insulin amounts before and after exercising.  If you take insulin or oral hypoglycemic agents, watch for signs and symptoms of hypoglycemia. They include:  Dizziness.  Shaking.  Sweating.  Chills.  Confusion.  Drink plenty of water while you exercise to prevent dehydration or heat stroke. Body water is lost during exercise and must be replaced.  Talk to your health care provider before starting an exercise program to make sure it is safe for you. Remember, almost any type of activity is better than none. Document Released: 07/15/2003 Document Revised: 09/08/2013 Document Reviewed: 10/01/2012 [employer] Patient Information 2015 [employer]. This information is not intended to replace advice given to you by your health care provider. Make sure you discuss any questions you have with your health care provider.  

## 2014-05-22 NOTE — Progress Notes (Signed)
Subjective:    Patient ID: Todd Rice, male    DOB: [birth year], 59 y.o.   MRN: [id number]  HPI Comments: 59 yo Wm presents for 3 month F/U for HTN, Cholesterol, DM, D. Deficient. He has been trying to lose weight with increasing exercise. He is eating a little better. He notes BP good at home. He has not been checking BS routinely. He checks feet routinely and denies skin break down or neuropathy increase. He did not change any medications at last OV except adding Singulair for allergies/ wheeze. He is only on MF 500 BID. He is down 3 # since 03/2014 visit.  He notes allergist appointment was rescheduled for 06/11/14. He notes mild improvement with symptoms with wheezing and drainage. He has decreased inhaler usage with Singulair.  He notes [place] has improved but was initially sore. He denies any ROM pain only pain with touching area of initially soreness. He notes pain is minimal  Lab Results      Component                Value               Date                      WBC                      6.1                 02/05/2014                HGB                      15.5                02/05/2014                HCT                      45.9                02/05/2014                PLT                      139*                02/05/2014                GLUCOSE                  141*                02/05/2014                CHOL                     169                 02/05/2014                TRIG                     341*                02/05/2014  HDL                      35*                 02/05/2014                LDLCALC                  66                  02/05/2014                ALT                      25                  02/05/2014                AST                      24                  02/05/2014                NA                       139                 02/05/2014                K                        3.8                  02/05/2014                CL                       101                 02/05/2014                CREATININE               1.27                02/05/2014                BUN                      24*                 02/05/2014                CO2                      26                  02/05/2014                TSH                      2.366               02/05/2014  PSA                      2.12                11/05/2013                HGBA1C                   6.6*                02/05/2014                MICROALBUR               1.07                11/05/2013                 Medication List       This list is accurate as of: 05/22/14  9:11 AM.  Always use your most recent med list.               albuterol 108 (90 BASE) MCG/ACT inhaler  Commonly known as:  PROVENTIL HFA;VENTOLIN HFA  Inhale 2 puffs into the lungs every 6 (six) hours as needed for wheezing or shortness of breath.     allopurinol 300 MG tablet  Commonly known as:  ZYLOPRIM  Take 300 mg by mouth daily. TAKE 1 TABLET BY MOUTH ONCE DAILY     ALPRAZolam 0.5 MG tablet  Commonly known as:  XANAX  TAKE 1 TABLET BY MOUTH 1 TO 3 TIMES DAILY     aspirin EC 81 MG tablet  Take 81 mg by mouth at bedtime.     COLCRYS 0.6 MG tablet  Generic drug:  colchicine  TAKE 1 TABLET BY MOUTH TWICE DAILY     EPINEPHrine 0.3 mg/0.3 mL Soaj injection  Commonly known as:  EPIPEN 2-PAK  Inject 0.3 mLs (0.3 mg total) into the muscle once.     metFORMIN 500 MG tablet  Commonly known as:  GLUCOPHAGE  TAKE 1 TABLET BY MOUTH TWICE DAILY     montelukast 10 MG tablet  Commonly known as:  SINGULAIR  Take 1 tablet (10 mg total) by mouth daily.     PROBIOTIC DAILY PO  Take 1 tablet by mouth daily.     rosuvastatin 10 MG tablet  Commonly known as:  CRESTOR  Take 1 tablet (10 mg total) by mouth at bedtime.     valsartan-hydrochlorothiazide 320-25 MG per tablet  Commonly known as:  DIOVAN-HCT  TAKE 1 TABLET BY MOUTH ONCE  DAILY     Vitamin D3 10000 UNITS capsule  Take 10,000 Units by mouth daily.       No Known Allergies   Past Medical History  Diagnosis Date  . Hypertension   . Right ureteral stone   . H/O: gout     STABLE  . OSA on CPAP   . Adult BMI 50.0-59.9 kg/sq m     52.77  . Hepatic steatosis 10/01/11  . Anal fissure   . Vitamin D deficiency   . Anxiety   . Hyperlipidemia   . Diabetes mellitus without complication     borderline  . Kidney tumor     right upper kidney tumor, monitoring  . GERD (gastroesophageal reflux disease)     30 years ago, maybe from peptic ulcer  . History of kidney stones  Review of Systems  Respiratory: Negative for shortness of breath.   Cardiovascular: Negative for chest pain.  Musculoskeletal: Negative for myalgias.  All other systems reviewed and are negative.  BP 138/80 mmHg  Pulse 78  Temp(Src) 98.6 F (37 C) (Temporal)  Resp 18  Ht 5\' 7"  (1.702 m)  Wt 314 lb (142.429 kg)  BMI 49.17 kg/m2  SpO2 96%     Objective:   Physical Exam  Constitutional: He is oriented to person, place, and time. He appears well-developed and well-nourished.  Obese  HENT:  Head: Atraumatic.  Eyes: Conjunctivae are normal.  Neck: Normal range of motion.  Cardiovascular: Normal rate, regular rhythm, normal heart sounds and intact distal pulses.   Pulmonary/Chest: Effort normal and breath sounds normal.  Abdominal: Soft. There is no tenderness.  Musculoskeletal: Normal range of motion.  Lymphadenopathy:    He has no cervical adenopathy.  Neurological: He is alert and oriented to person, place, and time.  Skin: Skin is warm and dry. No rash noted.  Psychiatric: He has a normal mood and affect. Judgment normal.  Nursing note and vitals reviewed.         Assessment & Plan:  1.  3 month F/U for HTN, Cholesterol,DM, D. Deficient. Needs healthy diet, cardio QD and obtain healthy weight. Check Labs, Check BP if >130/80 call office, Check BS if >200 call  office   2. ? Reactive airway vs Allergic rhinitis- Continue Singulair, increase H2o, allergy hygiene explained. Keep allergist appointment AD.  3. Obesity- Continue weight loss, increase activity and better diet. Pt aware of risks.   4. Arm trauma resolving- advised with NEG exam and improvement of symptom continue to monitor and w/c if wants xray

## 2014-05-23 LAB — INSULIN, FASTING: Insulin fasting, serum: 28.5 u[IU]/mL — ABNORMAL HIGH (ref 2.0–19.6)

## 2014-06-11 ENCOUNTER — Encounter: Payer: Self-pay | Admitting: Internal Medicine

## 2014-06-11 ENCOUNTER — Ambulatory Visit (INDEPENDENT_AMBULATORY_CARE_PROVIDER_SITE_OTHER): Payer: 59 | Admitting: Internal Medicine

## 2014-06-11 ENCOUNTER — Other Ambulatory Visit: Payer: 59

## 2014-06-11 VITALS — BP 118/74 | HR 73 | Ht 67.0 in | Wt 312.6 lb

## 2014-06-11 DIAGNOSIS — J4531 Mild persistent asthma with (acute) exacerbation: Secondary | ICD-10-CM

## 2014-06-11 DIAGNOSIS — J4520 Mild intermittent asthma, uncomplicated: Secondary | ICD-10-CM

## 2014-06-11 DIAGNOSIS — J3089 Other allergic rhinitis: Principal | ICD-10-CM

## 2014-06-11 DIAGNOSIS — J302 Other seasonal allergic rhinitis: Secondary | ICD-10-CM

## 2014-06-11 DIAGNOSIS — J309 Allergic rhinitis, unspecified: Secondary | ICD-10-CM

## 2014-06-11 MED ORDER — MONTELUKAST SODIUM 10 MG PO TABS: 10.0000 mg | ORAL_TABLET | Freq: Every day | ORAL | Status: DC

## 2014-06-11 NOTE — Progress Notes (Signed)
06/11/14- 22 yoM referred courtesy of Kelby Aline, PA/ Dr Marla Roe asthma as a child, allergies worse in fall and spring. Mother told him he had childhood asthma. He mainly complains of eyes itching and watering, nasal congestion and sneezing. Triggers include cold air causing dry cough helped by albuterol, Spring tree pollens and ragweed pollen in the fall. Occasional severe tach. Rarely needs albuterol inhaler. Never hospitalized for asthma. No problem with insect stings, food intolerances, skin rash/hives/aspirin Nasal septoplasty 1984. Obstructive sleep apnea/CPAP 14/Advanced-does not bother his nose much. Asks refill Singulair CXR 03/30/14- IMPRESSION: No active cardiopulmonary disease. Electronically Signed  By: Ivar Drape M.D.  On: 03/30/2014 10:00  Prior to Admission medications   Medication Sig Start Date End Date Taking? Authorizing Provider  albuterol (PROVENTIL HFA;VENTOLIN HFA) 108 (90 BASE) MCG/ACT inhaler Inhale 2 puffs into the lungs every 6 (six) hours as needed for wheezing or shortness of breath. 11/05/13  Yes Melissa R Smith, PA-C  allopurinol (ZYLOPRIM) 300 MG tablet Take 300 mg by mouth daily. TAKE 1 TABLET BY MOUTH ONCE DAILY 11/05/13  Yes Melissa R Smith, PA-C  ALPRAZolam (XANAX) 0.5 MG tablet TAKE 1 TABLET BY MOUTH 1 TO 3 TIMES DAILY 11/05/13  Yes Melissa R Smith, PA-C  aspirin EC 81 MG tablet Take 81 mg by mouth at bedtime.   Yes Historical Provider, MD  COLCRYS 0.6 MG tablet TAKE 1 TABLET BY MOUTH TWICE DAILY 03/17/14  Yes Unk Pinto, MD  EPINEPHrine (EPIPEN 2-PAK) 0.3 mg/0.3 mL IJ SOAJ injection Inject 0.3 mLs (0.3 mg total) into the muscle once. 03/27/14  Yes Melissa R Smith, PA-C  metFORMIN (GLUCOPHAGE) 500 MG tablet TAKE 1 TABLET BY MOUTH TWICE DAILY 01/21/14  Yes Unk Pinto, MD  montelukast (SINGULAIR) 10 MG tablet Take 1 tablet (10 mg total) by mouth daily. 06/11/14 06/11/15 Yes Deneise Lever, MD  Probiotic Product (PROBIOTIC DAILY PO) Take 1 tablet by  mouth daily.   Yes Historical Provider, MD  rosuvastatin (CRESTOR) 10 MG tablet Take 1 tablet (10 mg total) by mouth at bedtime. 11/05/13  Yes Melissa R Smith, PA-C  valsartan-hydrochlorothiazide (DIOVAN-HCT) 320-25 MG per tablet TAKE 1 TABLET BY MOUTH ONCE DAILY 12/10/13  Yes Unk Pinto, MD   Past Medical History  Diagnosis Date  . Hypertension   . Right ureteral stone   . H/O: gout     STABLE  . OSA on CPAP   . Adult BMI 50.0-59.9 kg/sq m     52.77  . Hepatic steatosis 10/01/11  . Anal fissure   . Vitamin D deficiency   . Anxiety   . Hyperlipidemia   . Diabetes mellitus without complication     borderline  . Kidney tumor     right upper kidney tumor, monitoring  . GERD (gastroesophageal reflux disease)     30 years ago, maybe from peptic ulcer  . History of kidney stones    Past Surgical History  Procedure Laterality Date  . Anal fissure repair  10-12-2000  . Nasal septum surgery  1985  . Left medial femoral condyle debridement & drilling/ removal loose body  09-15-2003  . Stone extraction with basket  06/12/2011    Procedure: STONE EXTRACTION WITH BASKET;  Surgeon: Claybon Jabs, MD;  Location: [facility];  Service: Urology;  Laterality: Right;  . Kidney stone removal      06/2011-stent placement  . Colonoscopy N/A 07/01/2012    Procedure: COLONOSCOPY;  Surgeon: Lafayette Dragon, MD;  Location: [facility];  Service: Endoscopy;  Laterality: N/A;  . Cystoscopy with retrograde pyelogram, ureteroscopy and stent placement Bilateral 02/26/2013    Procedure: CYSTOSCOPY WITH RETROGRADE PYELOGRAM, URETEROSCOPY AND STENT PLACEMENT;  Surgeon: Alexis Frock, MD;  Location: [facility];  Service: Urology;  Laterality: Bilateral;  . Holmium laser application Bilateral 44/05/270    Procedure: HOLMIUM LASER APPLICATION;  Surgeon: Alexis Frock, MD;  Location: [facility];  Service: Urology;  Laterality: Bilateral;   Family History  Problem Relation Age of Onset  . Heart disease  Mother     Atrial fibrillation  . Hypertension Mother   . Hyperlipidemia Mother   . Lung cancer Father     was a smoker  . Cancer Father     lung  . Hyperlipidemia Brother   . Heart disease Maternal Aunt   . Hyperlipidemia Maternal Aunt   . Hypertension Maternal Aunt   . Stroke Maternal Aunt   . Heart disease Paternal Grandmother   . Hyperlipidemia Paternal Grandmother    History   Social History  . Marital Status: Married    Spouse Name: N/A  . Number of Children: 0  . Years of Education: N/A   Occupational History  . Not on file.   Social History Main Topics  . Smoking status: Never Smoker   . Smokeless tobacco: Never Used  . Alcohol Use: No  . Drug Use: No     Comment: QUIT SMOKING " POT" 40 YRS AGO  . Sexual Activity: Not on file   Other Topics Concern  . Not on file   Social History Narrative   ROS-see HPI Constitutional:   No-   weight loss, night sweats, fevers, chills, fatigue, lassitude. HEENT:   No-  headaches, difficulty swallowing, tooth/dental problems, sore throat,       +sneezing, +itching, ear ache, nasal congestion, post nasal drip,  CV:  No-   chest pain, orthopnea, PND, swelling in lower extremities, anasarca,                                  dizziness, palpitations Resp: No-   shortness of breath with exertion or at rest.              +productive cough,  + non-productive cough,  No- coughing up of blood.              No-   change in color of mucus.  No- wheezing.   Skin: No-   rash or lesions. GI:  No-   heartburn, indigestion, abdominal pain, nausea, vomiting, diarrhea,                 change in bowel habits, loss of appetite GU: No-   dysuria, change in color of urine, no urgency or frequency.  No- flank pain. MS:  No-   joint pain or swelling.  No- decreased range of motion.  No- back pain. Neuro-     nothing unusual Psych:  No- change in mood or affect. No depression or anxiety.  No memory loss.  OBJ- Physical Exam  + morbidly  obese General- Alert, Oriented, Affect-appropriate, Distress- none acute Skin- rash-none, lesions- none, excoriation- none Lymphadenopathy- none Head- atraumatic            Eyes- Gross vision intact, PERRLA, conjunctivae and secretions clear            Ears- Hearing, canals-normal  Nose- Clear, no-Septal dev, mucus, polyps, erosion, perforation             Throat- Mallampati IV , mucosa clear , drainage- none, tonsils- atrophic Neck- flexible , trachea midline, no stridor , thyroid nl, carotid no bruit Chest - symmetrical excursion , unlabored           Heart/CV- RRR , no murmur , no gallop  , no rub, nl s1 s2                           - JVD- none , edema- none, stasis changes- none, varices- none           Lung- clear to P&A, wheeze- none, cough- none , dullness-none, rub- none           Chest wall-  Abd- tender-no, distended-no, bowel sounds-present, HSM- no Br/ Gen/ Rectal- Not done, not indicated Extrem- cyanosis- none, clubbing, none, atrophy- none, strength- nl Neuro- grossly intact to observation

## 2014-06-11 NOTE — Patient Instructions (Signed)
Script sent for Singulair refills  Order- schedule PFT   Dx asthma mild intermittent  Order lab- Allergy Profile

## 2014-06-12 LAB — ALLERGY FULL PROFILE
Allergen, D pternoyssinus,d7: <0.1 kU/L
Allergen,Goose feathers, e70: <0.1 kU/L
Bahia Grass: <0.1 kU/L
Bermuda Grass: <0.1 kU/L
Candida Albicans: <0.1 kU/L
Common Ragweed: <0.1 kU/L
Curvularia lunata: <0.1 kU/L
D. farinae: <0.1 kU/L
Elm IgE: <0.1 kU/L
Fescue: <0.1 kU/L
G005 Rye, Perennial: <0.1 kU/L
G009 Red Top: <0.1 kU/L
Helminthosporium halodes: <0.1 kU/L
House Dust Hollister: <0.1 kU/L
IgE (Immunoglobulin E), Serum: 14 kU/L (ref <115)
Lamb's Quarters: <0.1 kU/L
Oak: <0.1 kU/L
Plantain: <0.1 kU/L
Stemphylium Botryosum: <0.1 kU/L
Timothy Grass: <0.1 kU/L

## 2014-06-21 DIAGNOSIS — J3089 Other allergic rhinitis: Principal | ICD-10-CM

## 2014-06-21 DIAGNOSIS — J4520 Mild intermittent asthma, uncomplicated: Secondary | ICD-10-CM | POA: Insufficient documentation

## 2014-06-21 DIAGNOSIS — J302 Other seasonal allergic rhinitis: Secondary | ICD-10-CM | POA: Insufficient documentation

## 2014-06-21 NOTE — Assessment & Plan Note (Signed)
Rare need for rescue inhaler. He thinks Singulair has helped. Plan-refill Singulair and await results from labs

## 2014-06-21 NOTE — Assessment & Plan Note (Signed)
History consistent with seasonal allergic rhinitis. Plan-start with allergy profile, refill Singulair. This is not peak season so we will not try additional meds yet.

## 2014-06-24 NOTE — Progress Notes (Signed)
Quick Note:  Pt aware of results. ______ 

## 2014-07-24 ENCOUNTER — Ambulatory Visit (INDEPENDENT_AMBULATORY_CARE_PROVIDER_SITE_OTHER): Payer: 59 | Admitting: Internal Medicine

## 2014-07-24 ENCOUNTER — Encounter: Payer: Self-pay | Admitting: Internal Medicine

## 2014-07-24 VITALS — BP 106/78 | HR 84 | Temp 97.7°F | Resp 16 | Ht 67.0 in | Wt 310.6 lb

## 2014-07-24 DIAGNOSIS — J309 Allergic rhinitis, unspecified: Secondary | ICD-10-CM

## 2014-07-24 MED ORDER — CETIRIZINE HCL 10 MG PO CAPS: 10.0000 mg | ORAL_CAPSULE | Freq: Every day | ORAL | Status: DC

## 2014-07-24 MED ORDER — AZITHROMYCIN 250 MG PO TABS: ORAL_TABLET | ORAL | Status: DC

## 2014-07-24 MED ORDER — BENZONATATE 100 MG PO CAPS: 100.0000 mg | ORAL_CAPSULE | Freq: Four times a day (QID) | ORAL | Status: DC | PRN

## 2014-07-24 MED ORDER — PREDNISONE 20 MG PO TABS: ORAL_TABLET | ORAL | Status: DC

## 2014-07-24 MED ORDER — MOMETASONE FUROATE 50 MCG/ACT NA SUSP: 2.0000 | Freq: Every day | NASAL | Status: DC

## 2014-07-24 NOTE — Progress Notes (Signed)
Patient ID: Todd Rice, male   DOB: Sep 27, 1955, 59 y.o.   MRN: [id number]  HPI  Patient presents to the office for evaluation of cough.  It has been going on for 1 weeks.  Patient reports cough that is night > day, dry.  They also endorse postnasal drip and congestion and sinus pressure, no ear pain.  .  They have tried antibiotics or inhalers: albuterol.  They report that nothing has worked.  They admits to other sick contacts.  He does work in health care.  His patient has been sick.  Review of Systems  Constitutional: Negative for fever, chills and malaise/fatigue.  HENT: Positive for congestion, ear pain, sore throat and tinnitus. Negative for ear discharge and hearing loss.   Respiratory: Positive for cough. Negative for sputum production, shortness of breath and wheezing.   Gastrointestinal: Negative for nausea and vomiting.  Skin: Negative.   Neurological: Negative for headaches.    PE:  General:  Alert and non-toxic, WDWN, NAD HEENT: NCAT, PERLA, EOM normal, no occular discharge or erythema.  Nasal mucosal edema with sinus tenderness to palpation.  Oropharynx clear with minimal oropharyngeal edema and erythema.  Mucous membranes moist and pink. Neck:  Cervical adenopathy Chest:  RRR no MRGs.  Lungs clear to auscultation A&P with no rhonchi or rales.  Minimal end expiratory wheeze bilaterally heard best at the bases.   Abdomen: +BS x 4 quadrants, soft, non-tender, no guarding, rigidity, or rebound. Skin: warm and dry no rash Neuro: A&Ox4, CN II-XII grossly intact  Assessment and Plan:    1. Allergic rhinitis, unspecified allergic rhinitis type Ddx includes allergic rhinitis vs. Viral infection vs sinusitis.  Treat with nasonex, zyrtec, and prednisone first.  If no improvement then take zpak.  Patient to follow up prn or to call back with worsening symptoms.    - predniSONE (DELTASONE) 20 MG tablet; 3 tabs po day one, then 2 tabs daily x 4 days  Dispense: 11 tablet; Refill:  0 - azithromycin (ZITHROMAX Z-PAK) 250 MG tablet; 2 po day one, then 1 daily x 4 days  Dispense: 5 tablet; Refill: 0 - mometasone (NASONEX) 50 MCG/ACT nasal spray; Place 2 sprays into the nose daily.  Dispense: 17 g; Refill: 2 - benzonatate (TESSALON PERLES) 100 MG capsule; Take 1 capsule (100 mg total) by mouth every 6 (six) hours as needed for cough.  Dispense: 30 capsule; Refill: 1 - Cetirizine HCl (ZYRTEC ALLERGY) 10 MG CAPS; Take 1 capsule (10 mg total) by mouth at bedtime.  Dispense: 30 capsule; Refill: 0

## 2014-07-24 NOTE — Patient Instructions (Signed)

## 2014-08-13 ENCOUNTER — Ambulatory Visit: Payer: 59 | Admitting: Internal Medicine

## 2014-08-31 ENCOUNTER — Other Ambulatory Visit: Payer: Self-pay | Admitting: Physician Assistant

## 2014-09-01 ENCOUNTER — Ambulatory Visit (INDEPENDENT_AMBULATORY_CARE_PROVIDER_SITE_OTHER): Payer: 59 | Admitting: Internal Medicine

## 2014-09-01 ENCOUNTER — Encounter: Payer: Self-pay | Admitting: Internal Medicine

## 2014-09-01 VITALS — BP 136/84 | HR 76 | Temp 98.6°F | Resp 18 | Ht 67.0 in | Wt 317.0 lb

## 2014-09-01 DIAGNOSIS — E669 Obesity, unspecified: Secondary | ICD-10-CM

## 2014-09-01 DIAGNOSIS — Z79899 Other long term (current) drug therapy: Secondary | ICD-10-CM

## 2014-09-01 DIAGNOSIS — I10 Essential (primary) hypertension: Secondary | ICD-10-CM

## 2014-09-01 DIAGNOSIS — R7309 Other abnormal glucose: Secondary | ICD-10-CM

## 2014-09-01 DIAGNOSIS — E785 Hyperlipidemia, unspecified: Secondary | ICD-10-CM

## 2014-09-01 DIAGNOSIS — E559 Vitamin D deficiency, unspecified: Secondary | ICD-10-CM

## 2014-09-01 LAB — BASIC METABOLIC PANEL WITH GFR
BUN: 20 mg/dL (ref 6–23)
CO2: 24 mEq/L (ref 19–32)
Calcium: 9.7 mg/dL (ref 8.4–10.5)
Chloride: 101 mEq/L (ref 96–112)
Creat: 1.12 mg/dL (ref 0.50–1.35)
GFR, Est African American: 82 mL/min
GFR, Est Non African American: 71 mL/min
Glucose, Bld: 124 mg/dL — ABNORMAL HIGH (ref 70–99)
Potassium: 4.1 mEq/L (ref 3.5–5.3)
SODIUM: 138 meq/L (ref 135–145)

## 2014-09-01 LAB — CBC WITH DIFFERENTIAL/PLATELET
BASOS ABS: 0 10*3/uL (ref 0.0–0.1)
BASOS PCT: 0 % (ref 0–1)
EOS PCT: 1 % (ref 0–5)
Eosinophils Absolute: 0.1 10*3/uL (ref 0.0–0.7)
HCT: 45 % (ref 39.0–52.0)
Hemoglobin: 15.3 g/dL (ref 13.0–17.0)
LYMPHS ABS: 1.9 10*3/uL (ref 0.7–4.0)
Lymphocytes Relative: 29 % (ref 12–46)
MCH: 28.5 pg (ref 26.0–34.0)
MCHC: 34 g/dL (ref 30.0–36.0)
MCV: 84 fL (ref 78.0–100.0)
MPV: 10.3 fL (ref 8.6–12.4)
Monocytes Absolute: 0.6 10*3/uL (ref 0.1–1.0)
Monocytes Relative: 9 % (ref 3–12)
NEUTROS ABS: 4 10*3/uL (ref 1.7–7.7)
Neutrophils Relative %: 61 % (ref 43–77)
PLATELETS: 149 10*3/uL — AB (ref 150–400)
RBC: 5.36 MIL/uL (ref 4.22–5.81)
RDW: 14.8 % (ref 11.5–15.5)
WBC: 6.5 10*3/uL (ref 4.0–10.5)

## 2014-09-01 LAB — LIPID PANEL
CHOL/HDL RATIO: 5.5 ratio
CHOLESTEROL: 193 mg/dL (ref 0–200)
HDL: 35 mg/dL — AB (ref >=40)
LDL CALC: 113 mg/dL — AB (ref 0–99)
Triglycerides: 224 mg/dL — ABNORMAL HIGH (ref <150)
VLDL: 45 mg/dL — ABNORMAL HIGH (ref 0–40)

## 2014-09-01 LAB — HEPATIC FUNCTION PANEL
ALBUMIN: 3.8 g/dL (ref 3.5–5.2)
ALK PHOS: 51 U/L (ref 39–117)
ALT: 26 U/L (ref 0–53)
AST: 24 U/L (ref 0–37)
BILIRUBIN DIRECT: 0.1 mg/dL (ref 0.0–0.3)
Indirect Bilirubin: 0.7 mg/dL (ref 0.2–1.2)
Total Bilirubin: 0.8 mg/dL (ref 0.2–1.2)
Total Protein: 7.3 g/dL (ref 6.0–8.3)

## 2014-09-01 LAB — HEMOGLOBIN A1C
Hgb A1c MFr Bld: 7.4 % — ABNORMAL HIGH (ref <5.7)
MEAN PLASMA GLUCOSE: 166 mg/dL — AB (ref <117)

## 2014-09-01 LAB — MAGNESIUM: MAGNESIUM: 1.6 mg/dL (ref 1.5–2.5)

## 2014-09-01 NOTE — Progress Notes (Signed)
Patient ID: Todd Rice, male   DOB: 1955-08-19, 59 y.o.   MRN: [id number]  Assessment and Plan:  Hypertension:  -Continue medication,  -monitor blood pressure at home.  -Continue DASH diet.   -Reminder to go to the ER if any CP, SOB, nausea, dizziness, severe HA, changes vision/speech, left arm numbness and tingling, and jaw pain.  Cholesterol: -Continue diet and exercise.  -Check cholesterol.   Pre-diabetes: -Continue diet and exercise.  -Check A1C  Vitamin D Def: -check level -continue medications.   Continue diet and meds as discussed. Further disposition pending results of labs.  HPI 59 y.o. male  presents for 3 month follow up with hypertension, hyperlipidemia, prediabetes and vitamin D.   His blood pressure has been controlled at home, today their BP is BP: 136/84 mmHg.   He does not workout.  He does do some walking and thinks he will take out his bicycle out.   He denies chest pain, shortness of breath, dizziness.   He is on cholesterol medication and denies myalgias. His cholesterol is at goal. The cholesterol last visit was:   Lab Results  Component Value Date   CHOL 194 05/22/2014   HDL 35* 05/22/2014   LDLCALC 124* 05/22/2014   TRIG 175* 05/22/2014   CHOLHDL 5.5 05/22/2014     He has been working on diet and exercise for prediabetes, and denies foot ulcerations, hyperglycemia, hypoglycemia , increased appetite, nausea, paresthesia of the feet, polydipsia, polyuria, visual disturbances, vomiting and weight loss. Last A1C in the office was:  Lab Results  Component Value Date   HGBA1C 6.5* 05/22/2014  Typical lunch is rolled up [place] and swiss cheese, an apple, and some chips.    Patient is on Vitamin D supplement.  Lab Results  Component Value Date   VD25OH 38 02/05/2014     He does report that sometimes his right ankle does swell occasionally especially when its hot.  He reports that it is pain free.  He has no redness.    He does report that his  tumor has been rescanned by Dr. Tresa Moore and he has a mild growth of the tumor but both Dr. Tresa Moore and Dr. Alen Blew would like to do some watchful waiting.     Current Medications:  Current Outpatient Prescriptions on File Prior to Visit  Medication Sig Dispense Refill  . albuterol (PROVENTIL HFA;VENTOLIN HFA) 108 (90 BASE) MCG/ACT inhaler Inhale 2 puffs into the lungs every 6 (six) hours as needed for wheezing or shortness of breath. 18 g 3  . allopurinol (ZYLOPRIM) 300 MG tablet Take 300 mg by mouth daily. TAKE 1 TABLET BY MOUTH ONCE DAILY    . ALPRAZolam (XANAX) 0.5 MG tablet TAKE 1 TABLET BY MOUTH 3 TIMES DAILY 90 tablet 1  . aspirin EC 81 MG tablet Take 81 mg by mouth at bedtime.    Marland Kitchen azithromycin (ZITHROMAX Z-PAK) 250 MG tablet 2 po day one, then 1 daily x 4 days 5 tablet 0  . benzonatate (TESSALON PERLES) 100 MG capsule Take 1 capsule (100 mg total) by mouth every 6 (six) hours as needed for cough. 30 capsule 1  . Cetirizine HCl (ZYRTEC ALLERGY) 10 MG CAPS Take 1 capsule (10 mg total) by mouth at bedtime. 30 capsule 0  . COLCRYS 0.6 MG tablet TAKE 1 TABLET BY MOUTH TWICE DAILY 180 tablet 12  . EPINEPHrine (EPIPEN 2-PAK) 0.3 mg/0.3 mL IJ SOAJ injection Inject 0.3 mLs (0.3 mg total) into the muscle once. 2 Device  1  . metFORMIN (GLUCOPHAGE) 500 MG tablet TAKE 1 TABLET BY MOUTH TWICE DAILY 60 tablet 3  . mometasone (NASONEX) 50 MCG/ACT nasal spray Place 2 sprays into the nose daily. 17 g 2  . montelukast (SINGULAIR) 10 MG tablet Take 1 tablet (10 mg total) by mouth daily. 30 tablet prn  . predniSONE (DELTASONE) 20 MG tablet 3 tabs po day one, then 2 tabs daily x 4 days 11 tablet 0  . Probiotic Product (PROBIOTIC DAILY PO) Take 1 tablet by mouth daily.    . rosuvastatin (CRESTOR) 10 MG tablet Take 1 tablet (10 mg total) by mouth at bedtime. 30 tablet 3  . valsartan-hydrochlorothiazide (DIOVAN-HCT) 320-25 MG per tablet TAKE 1 TABLET BY MOUTH ONCE DAILY 90 tablet 2   No current  facility-administered medications on file prior to visit.    Medical History:  Past Medical History  Diagnosis Date  . Hypertension   . Right ureteral stone   . H/O: gout     STABLE  . OSA on CPAP   . Adult BMI 50.0-59.9 kg/sq m     52.77  . Hepatic steatosis 10/01/11  . Anal fissure   . Vitamin D deficiency   . Anxiety   . Hyperlipidemia   . Diabetes mellitus without complication     borderline  . Kidney tumor     right upper kidney tumor, monitoring  . GERD (gastroesophageal reflux disease)     30 years ago, maybe from peptic ulcer  . History of kidney stones     Allergies: No Known Allergies   Review of Systems:  Review of Systems  Constitutional: Negative for fever, chills and malaise/fatigue.  HENT: Negative for congestion, ear pain, nosebleeds, sore throat and tinnitus.   Eyes: Negative.   Respiratory: Negative for cough, shortness of breath and wheezing.   Cardiovascular: Negative for chest pain, palpitations and leg swelling.  Gastrointestinal: Positive for blood in stool. Negative for heartburn, nausea, vomiting, abdominal pain, diarrhea, constipation and melena.  Genitourinary: Negative for dysuria, urgency, frequency and hematuria.  Musculoskeletal: Negative.   Skin: Negative.   Neurological: Negative for dizziness, tingling, sensory change and headaches.  Psychiatric/Behavioral: Negative for depression. The patient is not nervous/anxious and does not have insomnia.     Family history- Review and unchanged  Social history- Review and unchanged  Physical Exam: BP 136/84 mmHg  Pulse 76  Temp(Src) 98.6 F (37 C) (Temporal)  Resp 18  Ht 5\' 7"  (1.702 m)  Wt 317 lb (143.79 kg)  BMI 49.64 kg/m2 Wt Readings from Last 3 Encounters:  09/01/14 317 lb (143.79 kg)  07/24/14 310 lb 9.6 oz (140.887 kg)  06/11/14 312 lb 9.6 oz (141.794 kg)    General Appearance: Well nourished well developed, in no apparent distress. Eyes: PERRLA, EOMs, conjunctiva no  swelling or erythema ENT/Mouth: Ear canals normal without obstruction, swelling, erythma, discharge.  TMs normal bilaterally.  Oropharynx moist, clear, without exudate, or postoropharyngeal swelling.  Mild decay of teeth. Neck: Supple, thyroid normal,no cervical adenopathy  Respiratory: Respiratory effort normal, Breath sounds clear but distant A&P without rhonchi, wheeze, or rale.  No retractions, no accessory usage. Cardio: RRR with no MRGs, distant S1S2. Brisk peripheral pulses without edema.  Abdomen: Soft morbidly obese, + BS,  Non tender, no guarding, rebound, hernias, masses. Musculoskeletal: Full ROM, 5/5 strength, Normal gait Skin: Warm, dry without rashes, lesions, ecchymosis.  Neuro: Awake and oriented X 3, Cranial nerves intact. Normal muscle tone, no cerebellar symptoms. Psych: Normal affect,  Insight and Judgment appropriate.    FORCUCCI, Baby Stairs, PA-C 8:54 AM  [facility]

## 2014-09-01 NOTE — Patient Instructions (Signed)

## 2014-09-02 LAB — INSULIN, RANDOM: INSULIN: 30.6 u[IU]/mL — AB (ref 2.0–19.6)

## 2014-09-06 LAB — VITAMIN D 1,25 DIHYDROXY
VITAMIN D 1, 25 (OH) TOTAL: 45 pg/mL (ref 18–72)
Vitamin D3 1, 25 (OH)2: 45 pg/mL

## 2014-09-14 ENCOUNTER — Other Ambulatory Visit: Payer: Self-pay | Admitting: Internal Medicine

## 2014-10-07 ENCOUNTER — Telehealth: Payer: Self-pay | Admitting: *Deleted

## 2014-10-07 ENCOUNTER — Other Ambulatory Visit: Payer: Self-pay | Admitting: *Deleted

## 2014-10-07 MED ORDER — METFORMIN HCL 500 MG PO TABS: 1000.0000 mg | ORAL_TABLET | Freq: Two times a day (BID) | ORAL | Status: DC

## 2014-10-07 NOTE — Telephone Encounter (Signed)
METFORMIN - PT NOW TAKES 4PILLS A DAY & NEEDS A NEW RX SENT IN FOR #120 PILLS  RATHER THAN #60

## 2014-10-08 ENCOUNTER — Other Ambulatory Visit: Payer: Self-pay | Admitting: Internal Medicine

## 2014-11-02 ENCOUNTER — Other Ambulatory Visit: Payer: Self-pay

## 2014-11-10 ENCOUNTER — Encounter: Payer: Self-pay | Admitting: Emergency Medicine

## 2014-11-20 ENCOUNTER — Other Ambulatory Visit: Payer: Self-pay | Admitting: Emergency Medicine

## 2014-12-11 ENCOUNTER — Ambulatory Visit (INDEPENDENT_AMBULATORY_CARE_PROVIDER_SITE_OTHER): Payer: 59 | Admitting: Internal Medicine

## 2014-12-11 ENCOUNTER — Encounter: Payer: Self-pay | Admitting: Internal Medicine

## 2014-12-11 VITALS — BP 128/78 | HR 64 | Temp 98.2°F | Resp 18 | Ht 66.0 in | Wt 300.0 lb

## 2014-12-11 DIAGNOSIS — Z79899 Other long term (current) drug therapy: Secondary | ICD-10-CM

## 2014-12-11 DIAGNOSIS — I10 Essential (primary) hypertension: Secondary | ICD-10-CM | POA: Diagnosis not present

## 2014-12-11 DIAGNOSIS — Z0001 Encounter for general adult medical examination with abnormal findings: Secondary | ICD-10-CM

## 2014-12-11 DIAGNOSIS — E559 Vitamin D deficiency, unspecified: Secondary | ICD-10-CM

## 2014-12-11 DIAGNOSIS — Z0000 Encounter for general adult medical examination without abnormal findings: Secondary | ICD-10-CM | POA: Diagnosis not present

## 2014-12-11 DIAGNOSIS — E349 Endocrine disorder, unspecified: Secondary | ICD-10-CM

## 2014-12-11 DIAGNOSIS — Z130 Encounter for screening for diseases of the blood and blood-forming organs and certain disorders involving the immune mechanism: Secondary | ICD-10-CM

## 2014-12-11 DIAGNOSIS — Z1212 Encounter for screening for malignant neoplasm of rectum: Secondary | ICD-10-CM

## 2014-12-11 DIAGNOSIS — E785 Hyperlipidemia, unspecified: Secondary | ICD-10-CM

## 2014-12-11 DIAGNOSIS — I2510 Atherosclerotic heart disease of native coronary artery without angina pectoris: Secondary | ICD-10-CM

## 2014-12-11 DIAGNOSIS — E669 Obesity, unspecified: Secondary | ICD-10-CM

## 2014-12-11 DIAGNOSIS — Z125 Encounter for screening for malignant neoplasm of prostate: Secondary | ICD-10-CM

## 2014-12-11 DIAGNOSIS — E119 Type 2 diabetes mellitus without complications: Secondary | ICD-10-CM

## 2014-12-11 DIAGNOSIS — J4531 Mild persistent asthma with (acute) exacerbation: Secondary | ICD-10-CM

## 2014-12-11 LAB — CBC WITH DIFFERENTIAL/PLATELET
Basophils Absolute: 0 10*3/uL (ref 0.0–0.1)
Basophils Relative: 0 % (ref 0–1)
EOS ABS: 0.1 10*3/uL (ref 0.0–0.7)
Eosinophils Relative: 1 % (ref 0–5)
HCT: 44.4 % (ref 39.0–52.0)
HEMOGLOBIN: 15.4 g/dL (ref 13.0–17.0)
LYMPHS ABS: 2.1 10*3/uL (ref 0.7–4.0)
Lymphocytes Relative: 26 % (ref 12–46)
MCH: 28.6 pg (ref 26.0–34.0)
MCHC: 34.7 g/dL (ref 30.0–36.0)
MCV: 82.5 fL (ref 78.0–100.0)
MONOS PCT: 7 % (ref 3–12)
MPV: 10.6 fL (ref 8.6–12.4)
Monocytes Absolute: 0.6 10*3/uL (ref 0.1–1.0)
NEUTROS PCT: 66 % (ref 43–77)
Neutro Abs: 5.4 10*3/uL (ref 1.7–7.7)
PLATELETS: 152 10*3/uL (ref 150–400)
RBC: 5.38 MIL/uL (ref 4.22–5.81)
RDW: 14.1 % (ref 11.5–15.5)
WBC: 8.2 10*3/uL (ref 4.0–10.5)

## 2014-12-11 MED ORDER — VALSARTAN-HYDROCHLOROTHIAZIDE 320-25 MG PO TABS: 1.0000 | ORAL_TABLET | Freq: Every day | ORAL | Status: DC

## 2014-12-11 MED ORDER — ALLOPURINOL 300 MG PO TABS: 300.0000 mg | ORAL_TABLET | Freq: Every day | ORAL | Status: DC

## 2014-12-11 MED ORDER — COLCRYS 0.6 MG PO TABS: 0.6000 mg | ORAL_TABLET | Freq: Two times a day (BID) | ORAL | Status: DC

## 2014-12-11 MED ORDER — MONTELUKAST SODIUM 10 MG PO TABS: 10.0000 mg | ORAL_TABLET | Freq: Every day | ORAL | Status: DC

## 2014-12-11 MED ORDER — ROSUVASTATIN CALCIUM 10 MG PO TABS: 10.0000 mg | ORAL_TABLET | Freq: Every day | ORAL | Status: DC

## 2014-12-11 MED ORDER — ALBUTEROL SULFATE HFA 108 (90 BASE) MCG/ACT IN AERS: 2.0000 | INHALATION_SPRAY | Freq: Four times a day (QID) | RESPIRATORY_TRACT | Status: DC | PRN

## 2014-12-11 MED ORDER — ALPRAZOLAM 0.5 MG PO TABS: 0.5000 mg | ORAL_TABLET | Freq: Three times a day (TID) | ORAL | Status: DC

## 2014-12-11 MED ORDER — METFORMIN HCL 500 MG PO TABS: 1000.0000 mg | ORAL_TABLET | Freq: Two times a day (BID) | ORAL | Status: DC

## 2014-12-11 NOTE — Progress Notes (Signed)
Patient ID: Todd Rice, male   DOB: 04-17-1956, 59 y.o.   MRN: [id number] Complete Physical  Assessment and Plan:   1. Essential hypertension  - Urinalysis, Routine w reflex microscopic (not at [facility]) - Microalbumin / creatinine urine ratio - EKG 12-Lead - [place], RETROPERITNL ABD,  LTD - TSH  2. Coronary artery disease involving native coronary artery of native heart without angina pectoris -cont meds  3. Hyperlipidemia -cont crestor -cont diet and exercise - Lipid panel  4. Obesity -cont diet and exercise -patient not interested in using medications right now  5. Vitamin D deficiency -cont supplement - Vit D  25 hydroxy (rtn osteoporosis monitoring)  6. Type 2 diabetes mellitus without complication  - Hemoglobin A1c - Insulin, random  7. Asthma with acute exacerbation, mild persistent  - montelukast (SINGULAIR) 10 MG tablet; Take 1 tablet (10 mg total) by mouth daily.  Dispense: 30 tablet; Refill: prn  8. Medication management - CBC with Differential/Platelet - BASIC METABOLIC PANEL WITH GFR - Hepatic function panel - Magnesium  9. Screening for prostate cancer  - PSA  10. Screening for deficiency anemia  - Iron and TIBC - Vitamin B12  11. Testosterone deficiency  - Testosterone  12. Screening for rectal cancer  - POC Hemoccult Bld/Stl (3-Cd Home Screen); Future  13. Encounter for general adult medical examination with abnormal findings  - CBC with Differential/Platelet - BASIC METABOLIC PANEL WITH GFR - Hepatic function panel - Magnesium - Lipid panel - Hemoglobin A1c - Insulin, random - PSA - Iron and TIBC - Vitamin B12 - Testosterone - Urinalysis, Routine w reflex microscopic (not at [facility]) - Microalbumin / creatinine urine ratio - EKG 12-Lead - [place], RETROPERITNL ABD,  LTD - POC Hemoccult Bld/Stl (3-Cd Home Screen); Future - Vit D  25 hydroxy (rtn osteoporosis monitoring) - TSH    Discussed med's effects and SE's. Screening labs  and tests as requested with regular follow-up as recommended.  [name]   His blood pressure has been controlled at home, today their BP is BP: 128/78 mmHg He does workout. He denies chest pain, shortness of breath, dizziness.   He is on cholesterol medication and denies myalgias. His cholesterol is not at goal. The cholesterol last visit was:   Lab Results  Component Value Date   CHOL 193 09/01/2014   HDL 35* 09/01/2014   LDLCALC 113* 09/01/2014   TRIG 224* 09/01/2014   CHOLHDL 5.5 09/01/2014    He has been working on diet and exercise for prediabetes, he is on bASA, he is on ACE/ARB and denies foot ulcerations, hyperglycemia, hypoglycemia , increased appetite, nausea, paresthesia of the feet, polydipsia, polyuria, visual disturbances, vomiting and weight loss. Last A1C in the office was:  Lab Results  Component Value Date   HGBA1C 7.4* 09/01/2014    Patient is on Vitamin D supplement.   Lab Results  Component Value Date   VD25OH 38 02/05/2014      Last PSA was: Lab Results  Component Value Date   PSA 2.12 11/05/2013  .  Denies BPH symptoms daytime frequency, double voiding, dysuria, hematuria, hesitancy, incontinence, intermittency, nocturia, sensation of incomplete bladder emptying, suprapubic pain, urgency or weak urinary stream.  Patient reports that he has cut down significantly on some of his sweets and his portion sizes and he has been losing weight at home. He reports that he has done some more walking.   He does report that he has been having  a lot more anxiety related to his job. He works through the anxiety with coping techniques but occasionally he does require his xanax.      Current Medications:  Current Outpatient Prescriptions on File Prior to Visit  Medication Sig Dispense Refill  . albuterol (PROVENTIL HFA;VENTOLIN HFA) 108 (90 BASE) MCG/ACT inhaler Inhale 2 puffs into the lungs every 6 (six) hours as needed for  wheezing or shortness of breath. 18 g 3  . allopurinol (ZYLOPRIM) 300 MG tablet TAKE 1 TABLET BY MOUTH ONCE DAILY 90 tablet 2  . ALPRAZolam (XANAX) 0.5 MG tablet TAKE 1 TABLET BY MOUTH 3 TIMES DAILY 90 tablet 1  . aspirin EC 81 MG tablet Take 81 mg by mouth at bedtime.    Marland Kitchen COLCRYS 0.6 MG tablet TAKE 1 TABLET BY MOUTH TWICE DAILY 180 tablet 12  . EPINEPHrine (EPIPEN 2-PAK) 0.3 mg/0.3 mL IJ SOAJ injection Inject 0.3 mLs (0.3 mg total) into the muscle once. 2 Device 1  . metFORMIN (GLUCOPHAGE) 500 MG tablet Take 2 tablets (1,000 mg total) by mouth 2 (two) times daily. 120 tablet 3  . montelukast (SINGULAIR) 10 MG tablet Take 1 tablet (10 mg total) by mouth daily. 30 tablet prn  . Probiotic Product (PROBIOTIC DAILY PO) Take 1 tablet by mouth daily.    . rosuvastatin (CRESTOR) 10 MG tablet Take 1 tablet (10 mg total) by mouth at bedtime. 30 tablet 3  . valsartan-hydrochlorothiazide (DIOVAN-HCT) 320-25 MG per tablet TAKE 1 TABLET BY MOUTH ONCE DAILY 90 tablet 2   No current facility-administered medications on file prior to visit.    Health Maintenance:  Immunization History  Administered Date(s) Administered  . Influenza-Unspecified 02/03/2013, 02/05/2014  . Pneumococcal-Unspecified 11/05/2012    Tetanus: ??? Pneumovax: 2014 Colonoscopy: 2014 Eye Exam:  Dr.  Nyoka Cowden Dentist:  Dr. Nicholaus Bloom  [name], [name], [name], [name], [name]  Allergies: No Known Allergies  Medical History:  Past Medical History  Diagnosis Date  . Hypertension   . Right ureteral stone   . H/O: gout     STABLE  . OSA on CPAP   . Adult BMI 50.0-59.9 kg/sq m     52.77  . Hepatic steatosis 10/01/11  . Anal fissure   . Vitamin D deficiency   . Anxiety   . Hyperlipidemia   . Diabetes mellitus without complication     borderline   . Kidney tumor     right upper kidney tumor, monitoring  . GERD (gastroesophageal reflux disease)     30 years ago, maybe from peptic ulcer  . History of kidney stones     Surgical History:  Past Surgical History  Procedure Laterality Date  . Anal fissure repair  10-12-2000  . Nasal septum surgery  1985  . Left medial femoral condyle debridement & drilling/ removal loose body  09-15-2003  . Stone extraction with basket  06/12/2011    Procedure: STONE EXTRACTION WITH BASKET;  Surgeon: Claybon Jabs, MD;  [facility];  Service: Urology;  Laterality: Right;  . Kidney stone removal      06/2011-stent placement  . Colonoscopy N/A 07/01/2012    Procedure: COLONOSCOPY;  Surgeon: Lafayette Dragon, MD;  Location: [facility];  Service: Endoscopy;  Laterality: N/A;  . Cystoscopy with retrograde pyelogram, ureteroscopy and stent placement Bilateral 02/26/2013    Procedure:  CYSTOSCOPY WITH RETROGRADE PYELOGRAM, URETEROSCOPY AND STENT PLACEMENT;  Surgeon: Alexis Frock, MD;  Location: [facility];  Service: Urology;  Laterality: Bilateral;  . Holmium laser application Bilateral 96/08/5407    Procedure: HOLMIUM LASER APPLICATION;  Surgeon: Alexis Frock, MD;  Location: [facility];  Service: Urology;  Laterality: Bilateral;    Family History:  Family History  Problem Relation Age of Onset  . Heart disease Mother     Atrial fibrillation  . Hypertension Mother   . Hyperlipidemia Mother   . Lung cancer Father     was a smoker  . Cancer Father     lung  . Hyperlipidemia Brother   . Heart disease Maternal Aunt   . Hyperlipidemia Maternal Aunt   . Hypertension Maternal Aunt   . Stroke Maternal Aunt   . Heart disease Paternal Grandmother   . Hyperlipidemia Paternal Grandmother     Social History:   History  Substance Use Topics  . Smoking status: Never Smoker   . Smokeless tobacco: Never Used  . Alcohol Use: No    Review of Systems:  Review of Systems  Constitutional:  Negative for fever, chills and malaise/fatigue.  HENT: Negative for congestion, ear pain and sore throat.   Eyes: Negative.   Respiratory: Negative for cough, shortness of breath and wheezing.   Cardiovascular: Positive for leg swelling. Negative for chest pain and palpitations.  Gastrointestinal: Negative for heartburn, diarrhea, constipation, blood in stool and melena.  Genitourinary: Negative.   Skin: Negative.   Neurological: Negative for dizziness, sensory change, loss of consciousness and headaches.  Psychiatric/Behavioral: Negative for depression. The patient is nervous/anxious. The patient does not have insomnia.     Physical Exam: Estimated body mass index is 48.44 kg/(m^2) as calculated from the following:   Height as of this encounter: 5\' 6"  (1.676 m).   Weight as of this encounter: 300 lb (136.079 kg). BP 128/78 mmHg  Pulse 64  Temp(Src) 98.2 F (36.8 C) (Temporal)  Resp 18  Ht 5\' 6"  (1.676 m)  Wt 300 lb (136.079 kg)  BMI 48.44 kg/m2  Wt Readings from Last 3 Encounters:  12/11/14 300 lb (136.079 kg)  09/01/14 317 lb (143.79 kg)  07/24/14 310 lb 9.6 oz (140.887 kg)    General Appearance: Well nourished, in no apparent distress.  Eyes: PERRLA, EOMs, conjunctiva no swelling or erythema ENT/Mouth: Ear canals clear bilaterally with no erythema, swelling, discharge.  TMs normal bilaterally with no erythema, bulging, or retractions.  Oropharynx clear and moist with no exudate, swelling, or erythema.  Dentition normal.   Neck: Supple, thyroid normal. No bruits, JVD, cervical adenopathy Respiratory: Respiratory effort normal, BS equal bilaterally without rales, rhonchi, wheezing or stridor.  Cardio: RRR without murmurs, rubs or gallops. Brisk peripheral pulses without edema.  Chest: symmetric, with normal excursions Abdomen: Obese, Soft, nontender, no guarding, rebound, hernias, masses, or organomegaly. Genitourinary: deferred as patient just had visit with  urologist Musculoskeletal: Full ROM all peripheral extremities,5/5 strength, and normal gait.  Skin: Warm, dry without rashes, lesions, ecchymosis. Neuro: A&Ox3, Cranial nerves intact, reflexes equal bilaterally. Normal muscle tone, no cerebellar symptoms. Sensation intact.  Psych: Normal affect, Insight and Judgment appropriate.   EKG: WNL no changes.  AORTA SCAN: WNL  Over 40 minutes of exam, counseling, chart review and critical decision making was performed  [name] 9:55 AM [facility]

## 2014-12-11 NOTE — Patient Instructions (Signed)
Preventive Care for Adults  A healthy lifestyle and preventive care can promote health and wellness. Preventive health guidelines for men include the following key practices:  A routine yearly physical is a good way to check with your health care provider about your health and preventative screening. It is a chance to share any concerns and updates on your health and to receive a thorough exam.  Visit your dentist for a routine exam and preventative care every 6 months. Brush your teeth twice a day and floss once a day. Good oral hygiene prevents tooth decay and gum disease.  The frequency of eye exams is based on your age, health, family medical history, use of contact lenses, and other factors. Follow your health care provider's recommendations for frequency of eye exams.  Eat a healthy diet. Foods such as vegetables, fruits, whole grains, low-fat dairy products, and lean protein foods contain the nutrients you need without too many calories. Decrease your intake of foods high in solid fats, added sugars, and salt. Eat the right amount of calories for you.Get information about a proper diet from your health care provider, if necessary.  Regular physical exercise is one of the most important things you can do for your health. Most adults should get at least 150 minutes of moderate-intensity exercise (any activity that increases your heart rate and causes you to sweat) each week. In addition, most adults need muscle-strengthening exercises on 2 or more days a week.  Maintain a healthy weight. The body mass index (BMI) is a screening tool to identify possible weight problems. It provides an estimate of body fat based on height and weight. Your health care provider can find your BMI and can help you achieve or maintain a healthy weight.For adults 20 years and older:  A BMI below 18.5 is considered underweight.  A BMI of 18.5 to 24.9 is normal.  A BMI of 25 to 29.9 is considered overweight.  A  BMI of 30 and above is considered obese.  Maintain normal blood lipids and cholesterol levels by exercising and minimizing your intake of saturated fat. Eat a balanced diet with plenty of fruit and vegetables. Blood tests for lipids and cholesterol should begin at age 20 and be repeated every 5 years. If your lipid or cholesterol levels are high, you are over 50, or you are at high risk for heart disease, you may need your cholesterol levels checked more frequently.Ongoing high lipid and cholesterol levels should be treated with medicines if diet and exercise are not working.  If you smoke, find out from your health care provider how to quit. If you do not use tobacco, do not start.  Lung cancer screening is recommended for adults aged 55-80 years who are at high risk for developing lung cancer because of a history of smoking. A yearly low-dose CT scan of the lungs is recommended for people who have at least a 30-pack-year history of smoking and are a current smoker or have quit within the past 15 years. A pack year of smoking is smoking an average of 1 pack of cigarettes a day for 1 year (for example: 1 pack a day for 30 years or 2 packs a day for 15 years). Yearly screening should continue until the smoker has stopped smoking for at least 15 years. Yearly screening should be stopped for people who develop a health problem that would prevent them from having lung cancer treatment.  If you choose to drink alcohol, do not have more   than 2 drinks per day. One drink is considered to be 12 ounces (355 mL) of beer, 5 ounces (148 mL) of wine, or 1.5 ounces (44 mL) of liquor.  Avoid use of street drugs. Do not share needles with anyone. Ask for help if you need support or instructions about stopping the use of drugs.  High blood pressure causes heart disease and increases the risk of stroke. Your blood pressure should be checked at least every 1-2 years. Ongoing high blood pressure should be treated with  medicines, if weight loss and exercise are not effective.  If you are 47-70 years old, ask your health care provider if you should take aspirin to prevent heart disease.  Diabetes screening involves taking a blood sample to check your fasting blood sugar level. This should be done once every 3 years, after age 4, if you are within normal weight and without risk factors for diabetes. Testing should be considered at a younger age or be carried out more frequently if you are overweight and have at least 1 risk factor for diabetes.  Colorectal cancer can be detected and often prevented. Most routine colorectal cancer screening begins at the age of 64 and continues through age 39. However, your health care provider may recommend screening at an earlier age if you have risk factors for colon cancer. On a yearly basis, your health care provider may provide home test kits to check for hidden blood in the stool. Use of a small camera at the end of a tube to directly examine the colon (sigmoidoscopy or colonoscopy) can detect the earliest forms of colorectal cancer. Talk to your health care provider about this at age 38, when routine screening begins. Direct exam of the colon should be repeated every 5-10 years through age 54, unless early forms of precancerous polyps or small growths are found.   Talk with your health care provider about prostate cancer screening.  Testicular cancer screening isrecommended for adult males. Screening includes self-exam, a health care provider exam, and other screening tests. Consult with your health care provider about any symptoms you have or any concerns you have about testicular cancer.  Use sunscreen. Apply sunscreen liberally and repeatedly throughout the day. You should seek shade when your shadow is shorter than you. Protect yourself by wearing long sleeves, pants, a wide-brimmed hat, and sunglasses year round, whenever you are outdoors.  Once a month, do a whole-body  skin exam, using a mirror to look at the skin on your back. Tell your health care provider about new moles, moles that have irregular borders, moles that are larger than a pencil eraser, or moles that have changed in shape or color.  Stay current with required vaccines (immunizations).  Influenza vaccine. All adults should be immunized every year.  Tetanus, diphtheria, and acellular pertussis (Td, Tdap) vaccine. An adult who has not previously received Tdap or who does not know his vaccine status should receive 1 dose of Tdap. This initial dose should be followed by tetanus and diphtheria toxoids (Td) booster doses every 10 years. Adults with an unknown or incomplete history of completing a 3-dose immunization series with Td-containing vaccines should begin or complete a primary immunization series including a Tdap dose. Adults should receive a Td booster every 10 years.  Varicella vaccine. An adult without evidence of immunity to varicella should receive 2 doses or a second dose if he has previously received 1 dose.  Human papillomavirus (HPV) vaccine. Males aged 13-21 years who have not  received the vaccine previously should receive the 3-dose series. Males aged 22-26 years may be immunized. Immunization is recommended through the age of 26 years for any male who has sex with males and did not get any or all doses earlier. Immunization is recommended for any person with an immunocompromised condition through the age of 26 years if he did not get any or all doses earlier. During the 3-dose series, the second dose should be obtained 4-8 weeks after the first dose. The third dose should be obtained 24 weeks after the first dose and 16 weeks after the second dose.  Zoster vaccine. One dose is recommended for adults aged 60 years or older unless certain conditions are present.    PREVNAR  - Pneumococcal 13-valent conjugate (PCV13) vaccine. When indicated, a person who is uncertain of his immunization  history and has no record of immunization should receive the PCV13 vaccine. An adult aged 19 years or older who has certain medical conditions and has not been previously immunized should receive 1 dose of PCV13 vaccine. This PCV13 should be followed with a dose of pneumococcal polysaccharide (PPSV23) vaccine. The PPSV23 vaccine dose should be obtained at least 8 weeks after the dose of PCV13 vaccine. An adult aged 19 years or older who has certain medical conditions and previously received 1 or more doses of PPSV23 vaccine should receive 1 dose of PCV13. The PCV13 vaccine dose should be obtained 1 or more years after the last PPSV23 vaccine dose.    PNEUMOVAX - Pneumococcal polysaccharide (PPSV23) vaccine. When PCV13 is also indicated, PCV13 should be obtained first. All adults aged 65 years and older should be immunized. An adult younger than age 65 years who has certain medical conditions should be immunized. Any person who resides in a nursing home or long-term care facility should be immunized. An adult smoker should be immunized. People with an immunocompromised condition and certain other conditions should receive both PCV13 and PPSV23 vaccines. People with human immunodeficiency virus (HIV) infection should be immunized as soon as possible after diagnosis. Immunization during chemotherapy or radiation therapy should be avoided. Routine use of PPSV23 vaccine is not recommended for American Indians, [place] Natives, or people younger than 65 years unless there are medical conditions that require PPSV23 vaccine. When indicated, people who have unknown immunization and have no record of immunization should receive PPSV23 vaccine. One-time revaccination 5 years after the first dose of PPSV23 is recommended for people aged 19-64 years who have chronic kidney failure, nephrotic syndrome, asplenia, or immunocompromised conditions. People who received 1-2 doses of PPSV23 before age 65 years should receive another  dose of PPSV23 vaccine at age 65 years or later if at least 5 years have passed since the previous dose. Doses of PPSV23 are not needed for people immunized with PPSV23 at or after age 65 years.    Hepatitis A vaccine. Adults who wish to be protected from this disease, have certain high-risk conditions, work with hepatitis A-infected animals, work in hepatitis A research labs, or travel to or work in countries with a high rate of hepatitis A should be immunized. Adults who were previously unvaccinated and who anticipate close contact with an international adoptee during the first 60 days after arrival in the [place] from a country with a high rate of hepatitis A should be immunized.    Hepatitis B vaccine. Adults should be immunized if they wish to be protected from this disease, have certain high-risk conditions, may be exposed to   blood or other infectious body fluids, are household contacts or sex partners of hepatitis B positive people, are clients or workers in certain care facilities, or travel to or work in countries with a high rate of hepatitis B.   Preventive Service / Frequency   Ages 40 to 64  Blood pressure check.  Lipid and cholesterol check  Lung cancer screening. / Every year if you are aged 55-80 years and have a 30-pack-year history of smoking and currently smoke or have quit within the past 15 years. Yearly screening is stopped once you have quit smoking for at least 15 years or develop a health problem that would prevent you from having lung cancer treatment.  Fecal occult blood test (FOBT) of stool. / Every year beginning at age 50 and continuing until age 75. You may not have to do this test if you get a colonoscopy every 10 years.  Flexible sigmoidoscopy** or colonoscopy.** / Every 5 years for a flexible sigmoidoscopy or every 10 years for a colonoscopy beginning at age 50 and continuing until age 75. Screening for abdominal aortic aneurysm (AAA)  by ultrasound is  recommended for people who have history of high blood pressure or who are current or former smokers.   

## 2014-12-12 LAB — LIPID PANEL
Cholesterol: 135 mg/dL (ref 125–200)
HDL: 37 mg/dL — ABNORMAL LOW (ref >=40)
LDL Cholesterol: 66 mg/dL (ref <130)
Total CHOL/HDL Ratio: 3.6 Ratio (ref <=5.0)
Triglycerides: 158 mg/dL — ABNORMAL HIGH (ref <150)
VLDL: 32 mg/dL — ABNORMAL HIGH (ref <30)

## 2014-12-12 LAB — URINALYSIS, ROUTINE W REFLEX MICROSCOPIC
Bilirubin Urine: NEGATIVE
Glucose, UA: NEGATIVE
Hgb urine dipstick: NEGATIVE
Ketones, ur: NEGATIVE
Nitrite: NEGATIVE
Protein, ur: NEGATIVE
Specific Gravity, Urine: 1.006 (ref 1.001–1.035)
pH: 6 (ref 5.0–8.0)

## 2014-12-12 LAB — BASIC METABOLIC PANEL WITH GFR
BUN: 22 mg/dL (ref 7–25)
CALCIUM: 10.1 mg/dL (ref 8.6–10.3)
CO2: 27 mmol/L (ref 20–31)
CREATININE: 1.14 mg/dL (ref 0.70–1.25)
Chloride: 99 mmol/L (ref 98–110)
GFR, EST AFRICAN AMERICAN: 80 mL/min (ref >=60)
GFR, Est Non African American: 70 mL/min (ref >=60)
GLUCOSE: 100 mg/dL — AB (ref 65–99)
Potassium: 4.1 mmol/L (ref 3.5–5.3)
SODIUM: 138 mmol/L (ref 135–146)

## 2014-12-12 LAB — VITAMIN B12: Vitamin B-12: 746 pg/mL (ref 211–911)

## 2014-12-12 LAB — IRON AND TIBC
%SAT: 20 % (ref 20–55)
IRON: 75 ug/dL (ref 42–165)
TIBC: 368 ug/dL (ref 215–435)
UIBC: 293 ug/dL (ref 125–400)

## 2014-12-12 LAB — MICROALBUMIN / CREATININE URINE RATIO
Creatinine, Urine: 122.2 mg/dL
Microalb Creat Ratio: 3.3 mg/g (ref 0.0–30.0)
Microalb, Ur: 0.4 mg/dL

## 2014-12-12 LAB — URINALYSIS, MICROSCOPIC ONLY
CASTS: NONE SEEN [LPF]
Crystals: NONE SEEN [HPF]
RBC / HPF: NONE SEEN RBC/HPF (ref <=2)
Squamous Epithelial / LPF: NONE SEEN [HPF] (ref <=5)
Yeast: NONE SEEN [HPF]

## 2014-12-12 LAB — VITAMIN D 25 HYDROXY (VIT D DEFICIENCY, FRACTURES): Vit D, 25-Hydroxy: 26 ng/mL — ABNORMAL LOW (ref 30–100)

## 2014-12-12 LAB — MAGNESIUM: Magnesium: 1.5 mg/dL (ref 1.5–2.5)

## 2014-12-12 LAB — PSA: PSA: 2.38 ng/mL (ref <=4.00)

## 2014-12-12 LAB — HEPATIC FUNCTION PANEL
ALT: 24 U/L (ref 9–46)
AST: 20 U/L (ref 10–35)
Albumin: 4.2 g/dL (ref 3.6–5.1)
Alkaline Phosphatase: 57 U/L (ref 40–115)
Bilirubin, Direct: 0.1 mg/dL
Indirect Bilirubin: 0.5 mg/dL (ref 0.2–1.2)
Total Bilirubin: 0.6 mg/dL (ref 0.2–1.2)
Total Protein: 7.4 g/dL (ref 6.1–8.1)

## 2014-12-12 LAB — HEMOGLOBIN A1C
HEMOGLOBIN A1C: 6.4 % — AB (ref <5.7)
Mean Plasma Glucose: 137 mg/dL — ABNORMAL HIGH (ref <117)

## 2014-12-12 LAB — TESTOSTERONE: Testosterone: 390 ng/dL (ref 300–890)

## 2014-12-12 LAB — TSH: TSH: 2.6 u[IU]/mL (ref 0.350–4.500)

## 2014-12-14 LAB — INSULIN, RANDOM: INSULIN: 20 u[IU]/mL — AB (ref 2.0–19.6)

## 2014-12-15 ENCOUNTER — Other Ambulatory Visit: Payer: Self-pay | Admitting: Internal Medicine

## 2014-12-15 DIAGNOSIS — J45901 Unspecified asthma with (acute) exacerbation: Secondary | ICD-10-CM

## 2014-12-15 MED ORDER — ALBUTEROL SULFATE HFA 108 (90 BASE) MCG/ACT IN AERS: INHALATION_SPRAY | RESPIRATORY_TRACT | Status: DC

## 2015-01-17 ENCOUNTER — Ambulatory Visit (INDEPENDENT_AMBULATORY_CARE_PROVIDER_SITE_OTHER): Payer: 59 | Admitting: Family Medicine

## 2015-01-17 VITALS — BP 138/62 | HR 82 | Temp 97.6°F | Resp 16 | Ht 67.0 in | Wt 299.6 lb

## 2015-01-17 DIAGNOSIS — M10071 Idiopathic gout, right ankle and foot: Secondary | ICD-10-CM

## 2015-01-17 MED ORDER — PREDNISONE 20 MG PO TABS: ORAL_TABLET | ORAL | Status: DC

## 2015-01-17 MED ORDER — HYDROCODONE-ACETAMINOPHEN 5-325 MG PO TABS: 1.0000 | ORAL_TABLET | Freq: Three times a day (TID) | ORAL | Status: DC | PRN

## 2015-01-17 NOTE — Progress Notes (Signed)
[facility] [address], [place] [address] [phone]  Date:  01/17/2015   Name:  Todd Rice   DOB:  Sep 22, 1955   MRN:  [id number]  PCP:  Alesia Richards, MD    Chief Complaint: Gout   History of Present Illness:  PROPHET RENWICK is a 59 y.o. very pleasant male patient who presents with the following:  Here today with suspected gout exacerbation.  He has a long history of gout, always in the right foot It is in his right 1st MCP as per his usual He noted onset of sx yesterday am. He is on allopurinol and colchincine, but this is not helping as much as it sometimes does.    He has used pred for this in the past with success.   He has been eating a little more red meat recently and wonders if this is the cause of his sx  He has borderline DM- his A1c looked good at this last visit. He is on metformin BID No injury to his foot  Patient Active Problem List   Diagnosis Date Noted  . Seasonal and perennial allergic rhinitis 06/21/2014  . Asthma, mild intermittent, well-controlled 06/21/2014  . Pseudomonas aeruginosa colonization 10/08/2012  . Hemorrhage of rectum and anus 07/01/2012  . CAD (coronary artery disease) 11/15/2011  . Hypertension 11/15/2011  . Hyperlipidemia 11/15/2011  . Obesity 11/15/2011  . Ureteral calculus, right 06/12/2011    Past Medical History  Diagnosis Date  . Hypertension   . Right ureteral stone   . H/O: gout     STABLE  . OSA on CPAP   . Adult BMI 50.0-59.9 kg/sq m     52.77  . Hepatic steatosis 10/01/11  . Anal fissure   . Vitamin D deficiency   . Anxiety   . Hyperlipidemia   . Diabetes mellitus without complication     borderline  . Kidney tumor     right upper kidney tumor, monitoring  . GERD (gastroesophageal reflux disease)     30 years ago, maybe from peptic ulcer  . History of kidney stones     Past Surgical History  Procedure Laterality Date  . Anal fissure repair  10-12-2000  . Nasal  septum surgery  1985  . Left medial femoral condyle debridement & drilling/ removal loose body  09-15-2003  . Stone extraction with basket  06/12/2011    Procedure: STONE EXTRACTION WITH BASKET;  Surgeon: Claybon Jabs, MD;  Location: [facility];  Service: Urology;  Laterality: Right;  . Kidney stone removal      06/2011-stent placement  . Colonoscopy N/A 07/01/2012    Procedure: COLONOSCOPY;  Surgeon: Lafayette Dragon, MD;  Location: [facility];  Service: Endoscopy;  Laterality: N/A;  . Cystoscopy with retrograde pyelogram, ureteroscopy and stent placement Bilateral 02/26/2013    Procedure: CYSTOSCOPY WITH RETROGRADE PYELOGRAM, URETEROSCOPY AND STENT PLACEMENT;  Surgeon: Alexis Frock, MD;  Location: [facility];  Service: Urology;  Laterality: Bilateral;  . Holmium laser application Bilateral 67/59/1638    Procedure: HOLMIUM LASER APPLICATION;  Surgeon: Alexis Frock, MD;  Location: [facility];  Service: Urology;  Laterality: Bilateral;    Social History  Substance Use Topics  . Smoking status: Never Smoker   . Smokeless tobacco: Never Used  . Alcohol Use: No    Family History  Problem Relation Age of Onset  . Heart disease Mother     Atrial fibrillation  . Hypertension Mother   .  Hyperlipidemia Mother   . Lung cancer Father     was a smoker  . Cancer Father     lung  . Hyperlipidemia Brother   . Heart disease Maternal Aunt   . Hyperlipidemia Maternal Aunt   . Hypertension Maternal Aunt   . Stroke Maternal Aunt   . Heart disease Paternal Grandmother   . Hyperlipidemia Paternal Grandmother     No Known Allergies  Medication list has been reviewed and updated.  Current Outpatient Prescriptions on File Prior to Visit  Medication Sig Dispense Refill  . albuterol (PROVENTIL HFA;VENTOLIN HFA) 108 (90 BASE) MCG/ACT inhaler Take 1 to 2 inhalations 4 x daily or every 4 hours if need to rescue asthma 18 g 99  . allopurinol (ZYLOPRIM) 300 MG tablet Take 1 tablet (300 mg  total) by mouth daily. 90 tablet 2  . ALPRAZolam (XANAX) 0.5 MG tablet Take 1 tablet (0.5 mg total) by mouth 3 (three) times daily. 90 tablet 1  . aspirin EC 81 MG tablet Take 81 mg by mouth at bedtime.    Marland Kitchen COLCRYS 0.6 MG tablet Take 1 tablet (0.6 mg total) by mouth 2 (two) times daily. 180 tablet 12  . EPINEPHrine (EPIPEN 2-PAK) 0.3 mg/0.3 mL IJ SOAJ injection Inject 0.3 mLs (0.3 mg total) into the muscle once. 2 Device 1  . metFORMIN (GLUCOPHAGE) 500 MG tablet Take 2 tablets (1,000 mg total) by mouth 2 (two) times daily. 120 tablet 3  . Probiotic Product (PROBIOTIC DAILY PO) Take 1 tablet by mouth daily.    . rosuvastatin (CRESTOR) 10 MG tablet Take 1 tablet (10 mg total) by mouth at bedtime. 30 tablet 3  . valsartan-hydrochlorothiazide (DIOVAN-HCT) 320-25 MG per tablet Take 1 tablet by mouth daily. 90 tablet 2  . montelukast (SINGULAIR) 10 MG tablet Take 1 tablet (10 mg total) by mouth daily. (Patient not taking: Reported on 01/17/2015) 30 tablet prn   No current facility-administered medications on file prior to visit.    Review of Systems:  As per HPI- otherwise negative.   Physical Examination: Filed Vitals:   01/17/15 1139  BP: 138/62  Pulse: 82  Temp: 97.6 F (36.4 C)  Resp: 16   Filed Vitals:   01/17/15 1139  Height: 5\' 7"  (1.702 m)  Weight: 299 lb 9.6 oz (135.898 kg)   Body mass index is 46.91 kg/(m^2). Ideal Body Weight: Weight in (lb) to have BMI = 25: 159.3  GEN: WDWN, NAD, Non-toxic, A & O x 3, morbid obesity HEENT: Atraumatic, Normocephalic. Neck supple. No masses, No LAD. Ears and Nose: No external deformity. CV: RRR, No M/G/R. No JVD. No thrill. No extra heart sounds. PULM: CTA B, no wheezes, crackles, rhonchi. No retractions. No resp. distress. No accessory muscle use. EXTR: No c/c/e NEURO favoring right minimally PSYCH: Normally interactive. Conversant. Not depressed or anxious appearing.  Calm demeanor.  Right foot: tenderness, mild swelling and warmth  at the 1st MTP. No wound   Assessment and Plan: Acute idiopathic gout of right foot - Plan: predniSONE (DELTASONE) 20 MG tablet, HYDROcodone-acetaminophen (NORCO/VICODIN) 5-325 MG per tablet  Typical gout exacerbation for this pt Treat with prednisone, vicodin if needed for pain  Meds ordered this encounter  Medications  . predniSONE (DELTASONE) 20 MG tablet    Sig: Take 3 pills for 2 days, then 2 pills for 2 days, then 1 pill for 2 days.    Dispense:  12 tablet    Refill:  0  . HYDROcodone-acetaminophen (NORCO/VICODIN) 5-325  MG per tablet    Sig: Take 1 tablet by mouth every 8 (eight) hours as needed.    Dispense:  10 tablet    Refill:  0     Signed Lamar Blinks, MD

## 2015-01-17 NOTE — Patient Instructions (Signed)
Take the prednisone as directed, and the pain medication as needed for gout Remember the pain med will make you sleepy- do not use when working or driving Do not take NSAID medications such as ibuprofen or aleve while on the prednisone Let me know if you are not improving in the next couple of days- Sooner if worse.

## 2015-03-01 ENCOUNTER — Other Ambulatory Visit: Payer: Self-pay | Admitting: *Deleted

## 2015-03-01 ENCOUNTER — Other Ambulatory Visit: Payer: Self-pay | Admitting: Internal Medicine

## 2015-03-01 MED ORDER — CRESTOR 10 MG PO TABS: 10.0000 mg | ORAL_TABLET | Freq: Every day | ORAL | Status: DC

## 2015-03-26 ENCOUNTER — Ambulatory Visit (INDEPENDENT_AMBULATORY_CARE_PROVIDER_SITE_OTHER): Payer: 59 | Admitting: Internal Medicine

## 2015-03-26 ENCOUNTER — Encounter: Payer: Self-pay | Admitting: Internal Medicine

## 2015-03-26 VITALS — BP 144/86 | HR 92 | Temp 98.4°F | Resp 16 | Ht 66.0 in | Wt 300.0 lb

## 2015-03-26 DIAGNOSIS — R7303 Prediabetes: Secondary | ICD-10-CM | POA: Diagnosis not present

## 2015-03-26 DIAGNOSIS — Z23 Encounter for immunization: Secondary | ICD-10-CM

## 2015-03-26 DIAGNOSIS — E785 Hyperlipidemia, unspecified: Secondary | ICD-10-CM

## 2015-03-26 DIAGNOSIS — I10 Essential (primary) hypertension: Secondary | ICD-10-CM

## 2015-03-26 DIAGNOSIS — I2510 Atherosclerotic heart disease of native coronary artery without angina pectoris: Secondary | ICD-10-CM

## 2015-03-26 DIAGNOSIS — G5601 Carpal tunnel syndrome, right upper limb: Secondary | ICD-10-CM | POA: Diagnosis not present

## 2015-03-26 DIAGNOSIS — Z79899 Other long term (current) drug therapy: Secondary | ICD-10-CM | POA: Diagnosis not present

## 2015-03-26 LAB — LIPID PANEL
CHOL/HDL RATIO: 5.6 ratio — AB (ref <=5.0)
CHOL/HDL RATIO: 5.5 ratio — AB (ref <=5.0)
CHOLESTEROL: 192 mg/dL (ref 125–200)
Cholesterol: 191 mg/dL (ref 125–200)
HDL: 34 mg/dL — ABNORMAL LOW (ref >=40)
HDL: 35 mg/dL — AB (ref >=40)
LDL CALC: 111 mg/dL (ref <130)
LDL Cholesterol: 113 mg/dL (ref <130)
TRIGLYCERIDES: 223 mg/dL — AB (ref <150)
TRIGLYCERIDES: 224 mg/dL — AB (ref <150)
VLDL: 45 mg/dL — AB (ref <30)
VLDL: 45 mg/dL — ABNORMAL HIGH (ref <30)

## 2015-03-26 LAB — BASIC METABOLIC PANEL WITH GFR
BUN: 21 mg/dL (ref 7–25)
CHLORIDE: 100 mmol/L (ref 98–110)
CO2: 26 mmol/L (ref 20–31)
Calcium: 10 mg/dL (ref 8.6–10.3)
Creat: 1.13 mg/dL (ref 0.70–1.25)
GFR, EST AFRICAN AMERICAN: 81 mL/min (ref >=60)
GFR, EST NON AFRICAN AMERICAN: 70 mL/min (ref >=60)
Glucose, Bld: 161 mg/dL — ABNORMAL HIGH (ref 65–99)
POTASSIUM: 4.1 mmol/L (ref 3.5–5.3)
Sodium: 137 mmol/L (ref 135–146)

## 2015-03-26 LAB — CBC WITH DIFFERENTIAL/PLATELET
BASOS ABS: 0 10*3/uL (ref 0.0–0.1)
Basophils Relative: 0 % (ref 0–1)
EOS ABS: 0.1 10*3/uL (ref 0.0–0.7)
EOS PCT: 2 % (ref 0–5)
HCT: 46.5 % (ref 39.0–52.0)
Hemoglobin: 15.8 g/dL (ref 13.0–17.0)
LYMPHS ABS: 1.9 10*3/uL (ref 0.7–4.0)
LYMPHS PCT: 28 % (ref 12–46)
MCH: 28.7 pg (ref 26.0–34.0)
MCHC: 34 g/dL (ref 30.0–36.0)
MCV: 84.5 fL (ref 78.0–100.0)
MPV: 11.1 fL (ref 8.6–12.4)
Monocytes Absolute: 0.6 10*3/uL (ref 0.1–1.0)
Monocytes Relative: 8 % (ref 3–12)
NEUTROS PCT: 62 % (ref 43–77)
Neutro Abs: 4.3 10*3/uL (ref 1.7–7.7)
PLATELETS: 156 10*3/uL (ref 150–400)
RBC: 5.5 MIL/uL (ref 4.22–5.81)
RDW: 14.7 % (ref 11.5–15.5)
WBC: 6.9 10*3/uL (ref 4.0–10.5)

## 2015-03-26 LAB — HEPATIC FUNCTION PANEL
ALBUMIN: 4 g/dL (ref 3.6–5.1)
ALT: 34 U/L (ref 9–46)
AST: 26 U/L (ref 10–35)
Alkaline Phosphatase: 53 U/L (ref 40–115)
BILIRUBIN DIRECT: 0.1 mg/dL (ref <=0.2)
BILIRUBIN TOTAL: 0.6 mg/dL (ref 0.2–1.2)
Indirect Bilirubin: 0.5 mg/dL (ref 0.2–1.2)
Total Protein: 7.4 g/dL (ref 6.1–8.1)

## 2015-03-26 LAB — HEMOGLOBIN A1C
Hgb A1c MFr Bld: 6.7 % — ABNORMAL HIGH (ref <5.7)
Mean Plasma Glucose: 146 mg/dL — ABNORMAL HIGH (ref <117)

## 2015-03-26 MED ORDER — MELOXICAM 15 MG PO TABS: 15.0000 mg | ORAL_TABLET | Freq: Every day | ORAL | Status: DC

## 2015-03-26 NOTE — Progress Notes (Signed)
Patient ID: Todd Rice, male   DOB: 1956/01/15, 59 y.o.   MRN: [id number]  Assessment and Plan:  Hypertension:  -Continue medication,  -monitor blood pressure at home.  -Continue DASH diet.   -Reminder to go to the ER if any CP, SOB, nausea, dizziness, severe HA, changes vision/speech, left arm numbness and tingling, and jaw pain.  Cholesterol: -Continue diet and exercise.  -Check cholesterol.   Pre-diabetes: -Continue diet and exercise.  -Check A1C  Vitamin D Def: -check level -continue medications.   Right Carpal Tunnel -either needs injection vs. Surgical repair.   -referral to Dr. Tamera Punt  Continue diet and meds as discussed. Further disposition pending results of labs.  HPI 59 y.o. male  presents for 3 month follow up with hypertension, hyperlipidemia, prediabetes and vitamin D.   His blood pressure has been controlled at home, today their BP is BP: (!) 144/86 mmHg.   He does workout. He denies chest pain, shortness of breath, dizziness.     He is on cholesterol medication and denies myalgias. His cholesterol is at goal. The cholesterol last visit was:   Lab Results  Component Value Date   CHOL 135 12/11/2014   HDL 37* 12/11/2014   LDLCALC 66 12/11/2014   TRIG 158* 12/11/2014   CHOLHDL 3.6 12/11/2014     He has been working on diet and exercise for prediabetes, and denies foot ulcerations, hyperglycemia, hypoglycemia , increased appetite, nausea, paresthesia of the feet, polydipsia, polyuria, visual disturbances, vomiting and weight loss. Last A1C in the office was:  Lab Results  Component Value Date   HGBA1C 6.4* 12/11/2014    Patient is on Vitamin D supplement.  Lab Results  Component Value Date   VD25OH 14* 12/11/2014     Patient reports that he has been having some twitching and some weakness in his right hand. He does have known carpal tunnel disease. He reports that he hasn't been back to see the orthopedist since being diagnosed. He reports that  he does wear night time braces.    Current Medications:  Current Outpatient Prescriptions on File Prior to Visit  Medication Sig Dispense Refill  . albuterol (PROVENTIL HFA;VENTOLIN HFA) 108 (90 BASE) MCG/ACT inhaler Take 1 to 2 inhalations 4 x daily or every 4 hours if need to rescue asthma 18 g 99  . allopurinol (ZYLOPRIM) 300 MG tablet Take 1 tablet (300 mg total) by mouth daily. 90 tablet 2  . ALPRAZolam (XANAX) 0.5 MG tablet Take 1 tablet (0.5 mg total) by mouth 3 (three) times daily. 90 tablet 1  . aspirin EC 81 MG tablet Take 81 mg by mouth at bedtime.    Marland Kitchen COLCRYS 0.6 MG tablet Take 1 tablet (0.6 mg total) by mouth 2 (two) times daily. 180 tablet 12  . CRESTOR 10 MG tablet Take 1 tablet (10 mg total) by mouth at bedtime. 90 tablet 3  . EPINEPHrine (EPIPEN 2-PAK) 0.3 mg/0.3 mL IJ SOAJ injection Inject 0.3 mLs (0.3 mg total) into the muscle once. 2 Device 1  . metFORMIN (GLUCOPHAGE) 500 MG tablet Take 2 tablets (1,000 mg total) by mouth 2 (two) times daily. 120 tablet 3  . montelukast (SINGULAIR) 10 MG tablet Take 1 tablet (10 mg total) by mouth daily. 30 tablet prn  . Probiotic Product (PROBIOTIC DAILY PO) Take 1 tablet by mouth daily.    . valsartan-hydrochlorothiazide (DIOVAN-HCT) 320-25 MG per tablet Take 1 tablet by mouth daily. 90 tablet 2   No current facility-administered medications on  file prior to visit.    Medical History:  Past Medical History  Diagnosis Date  . Hypertension   . Right ureteral stone   . H/O: gout     STABLE  . OSA on CPAP   . Adult BMI 50.0-59.9 kg/sq m     52.77  . Hepatic steatosis 10/01/11  . Anal fissure   . Vitamin D deficiency   . Anxiety   . Hyperlipidemia   . Diabetes mellitus without complication (HCC)     borderline  . Kidney tumor     right upper kidney tumor, monitoring  . GERD (gastroesophageal reflux disease)     30 years ago, maybe from peptic ulcer  . History of kidney stones     Allergies: No Known Allergies   Review of  Systems:  Review of Systems  Constitutional: Negative for fever, chills and malaise/fatigue.  HENT: Negative for congestion, ear pain and sore throat.   Eyes: Negative.   Respiratory: Negative for cough, shortness of breath and wheezing.   Cardiovascular: Negative for chest pain, palpitations and leg swelling.  Gastrointestinal: Negative for heartburn, diarrhea, constipation, blood in stool and melena.  Genitourinary: Negative.   Neurological: Positive for focal weakness. Negative for dizziness, sensory change, loss of consciousness and headaches.  Psychiatric/Behavioral: Negative for depression and suicidal ideas. The patient is not nervous/anxious.     Family history- Review and unchanged  Social history- Review and unchanged  Physical Exam: BP 144/86 mmHg  Pulse 92  Temp(Src) 98.4 F (36.9 C) (Temporal)  Resp 16  Ht 5\' 6"  (1.676 m)  Wt 300 lb (136.079 kg)  BMI 48.44 kg/m2 Wt Readings from Last 3 Encounters:  03/26/15 300 lb (136.079 kg)  01/17/15 299 lb 9.6 oz (135.898 kg)  12/11/14 300 lb (136.079 kg)    General Appearance: Well nourished well developed morbidly obese, in no apparent distress. Eyes: PERRLA, EOMs, conjunctiva no swelling or erythema ENT/Mouth: Ear canals normal without obstruction, swelling, erythma, discharge.  TMs normal bilaterally.  Oropharynx moist, clear, without exudate, or postoropharyngeal swelling. Neck: Supple, thyroid normal,no cervical adenopathy  Respiratory: Respiratory effort normal, Breath sounds clear A&P without rhonchi, wheeze, or rale.  No retractions, no accessory usage. Cardio: RRR with no MRGs. Brisk peripheral pulses without edema.  Abdomen: Soft, + BS,  Non tender, no guarding, rebound, hernias, masses. Musculoskeletal: Full ROM, 5/5 strength, Normal gait.  Mild right hand grip weakness and opposition weakness. Thenar eminence wasting.   Skin: Warm, dry without rashes, lesions, ecchymosis.  Neuro: Awake and oriented X 3, Cranial  nerves intact. Normal muscle tone, no cerebellar symptoms. Psych: Normal affect, Insight and Judgment appropriate.    [name], PA-C 8:59 AM [facility]

## 2015-03-26 NOTE — Addendum Note (Signed)
Addended by: [name] A on: 03/26/2015 12:31 PM   Modules accepted: Orders

## 2015-03-27 LAB — TSH
TSH: 2.124 u[IU]/mL (ref 0.350–4.500)
TSH: 2.145 u[IU]/mL (ref 0.350–4.500)

## 2015-03-27 LAB — HEMOGLOBIN A1C
HEMOGLOBIN A1C: 6.5 % — AB (ref <5.7)
Mean Plasma Glucose: 140 mg/dL — ABNORMAL HIGH (ref <117)

## 2015-05-19 MED FILL — ALPRAZolam 0.5 MG TABS: 0.5 | 30 days supply | Qty: 90 | Fill #1

## 2015-06-11 ENCOUNTER — Telehealth: Payer: Self-pay | Admitting: Internal Medicine

## 2015-06-11 ENCOUNTER — Other Ambulatory Visit: Payer: Self-pay | Admitting: Internal Medicine

## 2015-06-11 MED ORDER — PREDNISONE 20 MG PO TABS: ORAL_TABLET | ORAL | Status: DC

## 2015-06-11 MED ORDER — PROMETHAZINE-DM 6.25-15 MG/5ML PO SYRP: ORAL_SOLUTION | ORAL | Status: DC

## 2015-06-11 MED FILL — predniSONE 20 MG TABS: 20 | 15 days supply | Qty: 24 | Fill #0

## 2015-06-11 MED FILL — PROMETHAZINE-DM SYRUP: 6.25-15 | 12 days supply | Qty: 360 | Fill #0

## 2015-06-11 NOTE — Telephone Encounter (Signed)
Patient called in today regarding cough, sinus pressure, headache and asking for a prescription.  Will send in prednisone and cough syrup.  Recommend using nasal saline, antihistamine, and flonase daily two sprays per nostril.

## 2015-06-16 MED FILL — VALSARTAN-HCTZ 320-25 MG TA: 320-25 | 90 days supply | Qty: 90 | Fill #0

## 2015-07-02 ENCOUNTER — Encounter: Payer: Self-pay | Admitting: Internal Medicine

## 2015-07-02 ENCOUNTER — Ambulatory Visit (INDEPENDENT_AMBULATORY_CARE_PROVIDER_SITE_OTHER): Payer: 59 | Admitting: Internal Medicine

## 2015-07-02 VITALS — BP 136/80 | HR 76 | Temp 98.6°F | Resp 18 | Ht 66.0 in | Wt 303.0 lb

## 2015-07-02 DIAGNOSIS — I10 Essential (primary) hypertension: Secondary | ICD-10-CM | POA: Diagnosis not present

## 2015-07-02 DIAGNOSIS — Z79899 Other long term (current) drug therapy: Secondary | ICD-10-CM | POA: Diagnosis not present

## 2015-07-02 DIAGNOSIS — E119 Type 2 diabetes mellitus without complications: Secondary | ICD-10-CM | POA: Diagnosis not present

## 2015-07-02 DIAGNOSIS — E785 Hyperlipidemia, unspecified: Secondary | ICD-10-CM | POA: Diagnosis not present

## 2015-07-02 DIAGNOSIS — E669 Obesity, unspecified: Secondary | ICD-10-CM

## 2015-07-02 DIAGNOSIS — J309 Allergic rhinitis, unspecified: Secondary | ICD-10-CM

## 2015-07-02 LAB — HEPATIC FUNCTION PANEL
ALBUMIN: 3.9 g/dL (ref 3.6–5.1)
ALT: 33 U/L (ref 9–46)
AST: 23 U/L (ref 10–35)
Alkaline Phosphatase: 44 U/L (ref 40–115)
Bilirubin, Direct: 0.1 mg/dL (ref <=0.2)
Indirect Bilirubin: 0.7 mg/dL (ref 0.2–1.2)
TOTAL PROTEIN: 7.1 g/dL (ref 6.1–8.1)
Total Bilirubin: 0.8 mg/dL (ref 0.2–1.2)

## 2015-07-02 LAB — CBC WITH DIFFERENTIAL/PLATELET
BASOS ABS: 0 10*3/uL (ref 0.0–0.1)
Basophils Relative: 0 % (ref 0–1)
EOS PCT: 2 % (ref 0–5)
Eosinophils Absolute: 0.1 10*3/uL (ref 0.0–0.7)
HEMATOCRIT: 44.9 % (ref 39.0–52.0)
Hemoglobin: 15.5 g/dL (ref 13.0–17.0)
LYMPHS ABS: 2.3 10*3/uL (ref 0.7–4.0)
LYMPHS PCT: 34 % (ref 12–46)
MCH: 29.1 pg (ref 26.0–34.0)
MCHC: 34.5 g/dL (ref 30.0–36.0)
MCV: 84.4 fL (ref 78.0–100.0)
MONOS PCT: 9 % (ref 3–12)
MPV: 10.3 fL (ref 8.6–12.4)
Monocytes Absolute: 0.6 10*3/uL (ref 0.1–1.0)
Neutro Abs: 3.7 10*3/uL (ref 1.7–7.7)
Neutrophils Relative %: 55 % (ref 43–77)
Platelets: 150 10*3/uL (ref 150–400)
RBC: 5.32 MIL/uL (ref 4.22–5.81)
RDW: 14.7 % (ref 11.5–15.5)
WBC: 6.8 10*3/uL (ref 4.0–10.5)

## 2015-07-02 LAB — HEMOGLOBIN A1C
Hgb A1c MFr Bld: 7.2 % — ABNORMAL HIGH (ref <5.7)
Mean Plasma Glucose: 160 mg/dL — ABNORMAL HIGH (ref <117)

## 2015-07-02 LAB — BASIC METABOLIC PANEL WITH GFR
BUN: 19 mg/dL (ref 7–25)
CHLORIDE: 102 mmol/L (ref 98–110)
CO2: 26 mmol/L (ref 20–31)
Calcium: 9.7 mg/dL (ref 8.6–10.3)
Creat: 1.16 mg/dL (ref 0.70–1.25)
GFR, EST AFRICAN AMERICAN: 78 mL/min (ref >=60)
GFR, Est Non African American: 68 mL/min (ref >=60)
GLUCOSE: 146 mg/dL — AB (ref 65–99)
POTASSIUM: 4.1 mmol/L (ref 3.5–5.3)
Sodium: 138 mmol/L (ref 135–146)

## 2015-07-02 LAB — LIPID PANEL
Cholesterol: 206 mg/dL — ABNORMAL HIGH (ref 125–200)
HDL: 38 mg/dL — ABNORMAL LOW (ref >=40)
LDL CALC: 124 mg/dL (ref <130)
Total CHOL/HDL Ratio: 5.4 Ratio — ABNORMAL HIGH (ref <=5.0)
Triglycerides: 221 mg/dL — ABNORMAL HIGH (ref <150)
VLDL: 44 mg/dL — ABNORMAL HIGH (ref <30)

## 2015-07-02 MED ORDER — AZELASTINE HCL 0.1 % NA SOLN: 2.0000 | Freq: Two times a day (BID) | NASAL | Status: DC

## 2015-07-02 MED FILL — AZELASTINE 0.1% (137 MCG) S: 0.1 | 30 days supply | Qty: 30 | Fill #0

## 2015-07-02 NOTE — Patient Instructions (Signed)
[employer].

## 2015-07-02 NOTE — Progress Notes (Signed)
Patient ID: Todd Rice, male   DOB: [birth year], 60 y.o.   MRN: [id number]  Assessment and Plan:  Hypertension:  -Continue medication,  -monitor blood pressure at home.  -Continue DASH diet.   -Reminder to go to the ER if any CP, SOB, nausea, dizziness, severe HA, changes vision/speech, left arm numbness and tingling, and jaw pain.  Cholesterol: -Continue diet and exercise.  -Check cholesterol.   Diabetes without complications: -Continue diet and exercise.  -Check A1C  Vitamin D Def: -check level -continue medications.   Allergic rhinitis -astelin -cont zyrtec  Continue diet and meds as discussed. Further disposition pending results of labs.  HPI 60 y.o. male  presents for 3 month follow up with hypertension, hyperlipidemia, prediabetes and vitamin D.   His blood pressure has been controlled at home, today their BP is BP: 136/80 mmHg.   He does not workout. He denies chest pain, shortness of breath, dizziness.   He is on cholesterol medication and denies myalgias. His cholesterol is at goal. The cholesterol last visit was:   Lab Results  Component Value Date   CHOL 191 03/26/2015   HDL 35* 03/26/2015   LDLCALC 111 03/26/2015   TRIG 224* 03/26/2015   CHOLHDL 5.5* 03/26/2015     He has been working on diet and exercise for prediabetes, and denies foot ulcerations, hyperglycemia, hypoglycemia , increased appetite, nausea, paresthesia of the feet, polydipsia, polyuria, visual disturbances, vomiting and weight loss. Last A1C in the office was:  Lab Results  Component Value Date   HGBA1C 6.5* 03/26/2015    Patient is on Vitamin D supplement.  Lab Results  Component Value Date   VD25OH 72* 12/11/2014     He does report that he has been having a lot of constipation.  He reports that he has been using miralax a capful once per day.    Current Medications:  Current Outpatient Prescriptions on File Prior to Visit  Medication Sig Dispense Refill  . albuterol  (PROVENTIL HFA;VENTOLIN HFA) 108 (90 BASE) MCG/ACT inhaler Take 1 to 2 inhalations 4 x daily or every 4 hours if need to rescue asthma 18 g 99  . allopurinol (ZYLOPRIM) 300 MG tablet Take 1 tablet (300 mg total) by mouth daily. 90 tablet 2  . ALPRAZolam (XANAX) 0.5 MG tablet Take 1 tablet (0.5 mg total) by mouth 3 (three) times daily. 90 tablet 1  . aspirin EC 81 MG tablet Take 81 mg by mouth at bedtime.    Marland Kitchen COLCRYS 0.6 MG tablet Take 1 tablet (0.6 mg total) by mouth 2 (two) times daily. 180 tablet 12  . CRESTOR 10 MG tablet Take 1 tablet (10 mg total) by mouth at bedtime. 90 tablet 3  . EPINEPHrine (EPIPEN 2-PAK) 0.3 mg/0.3 mL IJ SOAJ injection Inject 0.3 mLs (0.3 mg total) into the muscle once. 2 Device 1  . meloxicam (MOBIC) 15 MG tablet Take 1 tablet (15 mg total) by mouth daily. 30 tablet 1  . metFORMIN (GLUCOPHAGE) 500 MG tablet Take 2 tablets (1,000 mg total) by mouth 2 (two) times daily. 120 tablet 3  . montelukast (SINGULAIR) 10 MG tablet Take 1 tablet (10 mg total) by mouth daily. 30 tablet prn  . Probiotic Product (PROBIOTIC DAILY PO) Take 1 tablet by mouth daily.    . valsartan-hydrochlorothiazide (DIOVAN-HCT) 320-25 MG per tablet Take 1 tablet by mouth daily. 90 tablet 2   No current facility-administered medications on file prior to visit.    Medical History:  Past  Medical History  Diagnosis Date  . Hypertension   . Right ureteral stone   . H/O: gout     STABLE  . OSA on CPAP   . Adult BMI 50.0-59.9 kg/sq m     52.77  . Hepatic steatosis 10/01/11  . Anal fissure   . Vitamin D deficiency   . Anxiety   . Hyperlipidemia   . Diabetes mellitus without complication (HCC)     borderline  . Kidney tumor     right upper kidney tumor, monitoring  . GERD (gastroesophageal reflux disease)     30 years ago, maybe from peptic ulcer  . History of kidney stones     Allergies: No Known Allergies   Review of Systems:  Review of Systems  Constitutional: Negative for fever,  chills and malaise/fatigue.  HENT: Positive for congestion. Negative for ear pain and sore throat.   Respiratory: Negative for cough, shortness of breath and wheezing.   Cardiovascular: Negative for chest pain, palpitations and leg swelling.  Gastrointestinal: Positive for constipation and blood in stool. Negative for heartburn, abdominal pain, diarrhea and melena.  Genitourinary: Negative for dysuria, urgency, frequency, hematuria and flank pain.  Musculoskeletal: Negative.   Neurological: Negative for dizziness, sensory change, loss of consciousness and headaches.  Psychiatric/Behavioral: Negative for depression. The patient is not nervous/anxious and does not have insomnia.     Family history- Review and unchanged  Social history- Review and unchanged  Physical Exam: BP 136/80 mmHg  Pulse 76  Temp(Src) 98.6 F (37 C) (Temporal)  Resp 18  Ht 5\' 6"  (1.676 m)  Wt 303 lb (137.44 kg)  BMI 48.93 kg/m2 Wt Readings from Last 3 Encounters:  07/02/15 303 lb (137.44 kg)  03/26/15 300 lb (136.079 kg)  01/17/15 299 lb 9.6 oz (135.898 kg)    General Appearance: Well nourished well developed, in no apparent distress. Eyes: PERRLA, EOMs, conjunctiva no swelling or erythema ENT/Mouth: Ear canals normal without obstruction, swelling, erythma, discharge.  TMs normal bilaterally.  Oropharynx moist, clear, without exudate, or postoropharyngeal swelling. Neck: Supple, thyroid normal,no cervical adenopathy  Respiratory: Respiratory effort normal, Breath sounds clear A&P without rhonchi, wheeze, or rale.  No retractions, no accessory usage. Cardio: RRR with no MRGs. Brisk peripheral pulses without edema.  Abdomen: Soft, + BS,  Non tender, no guarding, rebound, hernias, masses. Musculoskeletal: Full ROM, 5/5 strength, Normal gait Skin: Warm, dry without rashes, lesions, ecchymosis.  Neuro: Awake and oriented X 3, Cranial nerves intact. Normal muscle tone, no cerebellar symptoms. Psych: Normal  affect, Insight and Judgment appropriate.    [name], PA-C 9:02 AM [facility]

## 2015-07-03 LAB — TSH: TSH: 1.04 mIU/L (ref 0.40–4.50)

## 2015-07-20 ENCOUNTER — Other Ambulatory Visit: Payer: Self-pay | Admitting: Internal Medicine

## 2015-07-21 MED FILL — ALPRAZolam 0.5 MG TABS: 0.5 | 30 days supply | Qty: 90 | Fill #0

## 2015-08-10 DIAGNOSIS — Z0000 Encounter for general adult medical examination without abnormal findings: Secondary | ICD-10-CM | POA: Diagnosis not present

## 2015-08-10 DIAGNOSIS — C651 Malignant neoplasm of right renal pelvis: Secondary | ICD-10-CM | POA: Diagnosis not present

## 2015-08-10 DIAGNOSIS — N289 Disorder of kidney and ureter, unspecified: Secondary | ICD-10-CM | POA: Diagnosis not present

## 2015-08-13 ENCOUNTER — Other Ambulatory Visit: Payer: Self-pay | Admitting: Internal Medicine

## 2015-08-13 MED FILL — COLCHICINE 0.6 MG TABLET: 0.6 | 90 days supply | Qty: 180 | Fill #1

## 2015-08-13 MED FILL — metFORMIN HCL 500 MG TABS: 500 | 90 days supply | Qty: 360 | Fill #0

## 2015-08-13 MED FILL — ALLOPURINOL 300 MG TABLET: 300 | 90 days supply | Qty: 90 | Fill #1

## 2015-08-17 DIAGNOSIS — N3020 Other chronic cystitis without hematuria: Secondary | ICD-10-CM | POA: Diagnosis not present

## 2015-08-17 DIAGNOSIS — N200 Calculus of kidney: Secondary | ICD-10-CM | POA: Diagnosis not present

## 2015-08-17 DIAGNOSIS — C651 Malignant neoplasm of right renal pelvis: Secondary | ICD-10-CM | POA: Diagnosis not present

## 2015-08-17 DIAGNOSIS — Z0000 Encounter for general adult medical examination without abnormal findings: Secondary | ICD-10-CM | POA: Diagnosis not present

## 2015-08-17 DIAGNOSIS — R972 Elevated prostate specific antigen [PSA]: Secondary | ICD-10-CM | POA: Diagnosis not present

## 2015-08-24 MED FILL — ROSUVASTATIN CALCIUM 10 MG: 10 | 90 days supply | Qty: 90 | Fill #0

## 2015-09-02 DIAGNOSIS — H5203 Hypermetropia, bilateral: Secondary | ICD-10-CM | POA: Diagnosis not present

## 2015-09-02 DIAGNOSIS — H524 Presbyopia: Secondary | ICD-10-CM | POA: Diagnosis not present

## 2015-09-20 MED FILL — ALPRAZolam 0.5 MG TABS: 0.5 | 30 days supply | Qty: 90 | Fill #1

## 2015-09-20 MED FILL — VALSARTAN-HCTZ 320-25 MG TA: 320-25 | 90 days supply | Qty: 90 | Fill #1

## 2015-10-06 ENCOUNTER — Other Ambulatory Visit: Payer: Self-pay | Admitting: Internal Medicine

## 2015-10-06 DIAGNOSIS — J309 Allergic rhinitis, unspecified: Secondary | ICD-10-CM

## 2015-10-06 MED ORDER — AZELASTINE HCL 0.1 % NA SOLN: 2.0000 | Freq: Two times a day (BID) | NASAL | Status: DC

## 2015-10-06 MED ORDER — FLUTICASONE PROPIONATE 50 MCG/ACT NA SUSP: 2.0000 | Freq: Every day | NASAL | Status: DC

## 2015-10-06 MED ORDER — PREDNISONE 20 MG PO TABS: ORAL_TABLET | ORAL | Status: DC

## 2015-10-06 MED FILL — FLUTICASONE PROP 50 MCG SPR: 50 | 30 days supply | Qty: 16 | Fill #0

## 2015-10-06 MED FILL — predniSONE 20 MG TABS: 20 | 15 days supply | Qty: 27 | Fill #0

## 2015-10-06 MED FILL — AZELASTINE 0.1% (137 MCG) S: 0.1 | 30 days supply | Qty: 30 | Fill #0

## 2015-10-14 MED FILL — VENTOLIN HFA 90 MCG INHALER: 108 (90 BAS | 25 days supply | Qty: 18 | Fill #1

## 2015-11-11 ENCOUNTER — Encounter: Payer: Self-pay | Admitting: Internal Medicine

## 2015-11-11 ENCOUNTER — Ambulatory Visit (INDEPENDENT_AMBULATORY_CARE_PROVIDER_SITE_OTHER): Payer: 59 | Admitting: Internal Medicine

## 2015-11-11 VITALS — BP 126/72 | HR 92 | Temp 98.0°F | Resp 18 | Ht 66.0 in | Wt 308.0 lb

## 2015-11-11 DIAGNOSIS — Z9989 Dependence on other enabling machines and devices: Principal | ICD-10-CM

## 2015-11-11 DIAGNOSIS — G4733 Obstructive sleep apnea (adult) (pediatric): Secondary | ICD-10-CM | POA: Diagnosis not present

## 2015-11-11 DIAGNOSIS — T501X5A Adverse effect of loop [high-ceiling] diuretics, initial encounter: Secondary | ICD-10-CM

## 2015-11-11 NOTE — Progress Notes (Signed)
   Subjective:    Patient ID: Todd Rice, male    DOB: 01/13/1956, 60 y.o.   MRN: [id number]  HPI  Patient presents to the office for evaluation of the CPAP machine.  He reports that he needs a new machine.  He reports that the machine is over 10 years.  The machine was originally through [place].  He reports that he has [facility]  He reports that he uses it daily.  He reports that he wants to get one of the smaller. He is using a pressure of 14.  He reports that Dr. Oliver Pila was the original prescriber for his CPAP.  He reports that he has not had a sleep study done in 10 years.      He also reports that he would like to see if he can be excused from [place] on August the 17th.  He reports that it is hard for him to sit for long periods of time due to his fluid pills.    Review of Systems  Constitutional: Negative for fever, chills and fatigue.  Respiratory: Negative for chest tightness and shortness of breath.   Cardiovascular: Negative for chest pain and palpitations.  Gastrointestinal: Negative for nausea, vomiting, abdominal pain, diarrhea, constipation and abdominal distention.  Genitourinary: Negative for dysuria, urgency, frequency, hematuria and difficulty urinating.  Musculoskeletal: Negative.   Neurological: Negative for dizziness, weakness, numbness and headaches.  Psychiatric/Behavioral: Negative for suicidal ideas, decreased concentration and agitation. The patient is not nervous/anxious.        Objective:   Physical Exam  Constitutional: He is oriented to person, place, and time. He appears well-developed and well-nourished. No distress.  HENT:  Head: Normocephalic.  Mouth/Throat: Oropharynx is clear and moist. No oropharyngeal exudate.  Eyes: Conjunctivae are normal. No scleral icterus.  Neck: Normal range of motion. Neck supple. No JVD present. No thyromegaly present.  Cardiovascular: Normal rate, regular rhythm, normal  heart sounds and intact distal pulses.  Exam reveals no gallop and no friction rub.   No murmur heard. Pulmonary/Chest: Effort normal and breath sounds normal. No respiratory distress. He has no wheezes. He has no rales. He exhibits no tenderness.  Abdominal: Soft. Bowel sounds are normal. He exhibits no distension and no mass. There is no tenderness. There is no rebound and no guarding.  Musculoskeletal: Normal range of motion.  Lymphadenopathy:    He has no cervical adenopathy.  Neurological: He is alert and oriented to person, place, and time. No cranial nerve deficit. Coordination normal.  Skin: Skin is warm and dry. He is not diaphoretic.  Psychiatric: He has a normal mood and affect. His behavior is normal. Judgment and thought content normal.  Nursing note and vitals reviewed.   Filed Vitals:   11/11/15 1611  BP: 126/72  Pulse: 92  Temp: 98 F (36.7 C)  Resp: 18          Assessment & Plan:    1. OSA on CPAP -orders placed for new CPAP machine -may need new sleep study as most recent study was over 10 years ago -pressure of 14  2. Adverse effect of loop diuretic, initial encounter -will write note to allow patient to get out of jury duty

## 2015-11-26 MED FILL — ALPRAZolam 0.5 MG TABS: 0.5 | 30 days supply | Qty: 90 | Fill #2

## 2015-11-26 MED FILL — ROSUVASTATIN CALCIUM 10 MG: 10 | 90 days supply | Qty: 90 | Fill #1

## 2015-12-17 ENCOUNTER — Encounter: Payer: Self-pay | Admitting: Internal Medicine

## 2015-12-17 ENCOUNTER — Ambulatory Visit (INDEPENDENT_AMBULATORY_CARE_PROVIDER_SITE_OTHER): Payer: 59 | Admitting: Internal Medicine

## 2015-12-17 VITALS — BP 134/72 | HR 74 | Temp 98.4°F | Resp 18 | Ht 66.0 in | Wt 306.0 lb

## 2015-12-17 DIAGNOSIS — E559 Vitamin D deficiency, unspecified: Secondary | ICD-10-CM | POA: Diagnosis not present

## 2015-12-17 DIAGNOSIS — Z131 Encounter for screening for diabetes mellitus: Secondary | ICD-10-CM

## 2015-12-17 DIAGNOSIS — E349 Endocrine disorder, unspecified: Secondary | ICD-10-CM

## 2015-12-17 DIAGNOSIS — E669 Obesity, unspecified: Secondary | ICD-10-CM

## 2015-12-17 DIAGNOSIS — M6289 Other specified disorders of muscle: Secondary | ICD-10-CM

## 2015-12-17 DIAGNOSIS — Z2239 Carrier of other specified bacterial diseases: Secondary | ICD-10-CM | POA: Diagnosis not present

## 2015-12-17 DIAGNOSIS — Z0001 Encounter for general adult medical examination with abnormal findings: Secondary | ICD-10-CM

## 2015-12-17 DIAGNOSIS — E785 Hyperlipidemia, unspecified: Secondary | ICD-10-CM | POA: Diagnosis not present

## 2015-12-17 DIAGNOSIS — R6889 Other general symptoms and signs: Secondary | ICD-10-CM | POA: Diagnosis not present

## 2015-12-17 DIAGNOSIS — Z130 Encounter for screening for diseases of the blood and blood-forming organs and certain disorders involving the immune mechanism: Secondary | ICD-10-CM

## 2015-12-17 DIAGNOSIS — I2510 Atherosclerotic heart disease of native coronary artery without angina pectoris: Secondary | ICD-10-CM | POA: Diagnosis not present

## 2015-12-17 DIAGNOSIS — K625 Hemorrhage of anus and rectum: Secondary | ICD-10-CM | POA: Diagnosis not present

## 2015-12-17 DIAGNOSIS — E291 Testicular hypofunction: Secondary | ICD-10-CM

## 2015-12-17 DIAGNOSIS — Z125 Encounter for screening for malignant neoplasm of prostate: Secondary | ICD-10-CM | POA: Diagnosis not present

## 2015-12-17 DIAGNOSIS — J309 Allergic rhinitis, unspecified: Secondary | ICD-10-CM

## 2015-12-17 DIAGNOSIS — R531 Weakness: Secondary | ICD-10-CM

## 2015-12-17 DIAGNOSIS — I10 Essential (primary) hypertension: Secondary | ICD-10-CM | POA: Diagnosis not present

## 2015-12-17 DIAGNOSIS — N201 Calculus of ureter: Secondary | ICD-10-CM | POA: Diagnosis not present

## 2015-12-17 DIAGNOSIS — J302 Other seasonal allergic rhinitis: Secondary | ICD-10-CM

## 2015-12-17 DIAGNOSIS — J4520 Mild intermittent asthma, uncomplicated: Secondary | ICD-10-CM

## 2015-12-17 DIAGNOSIS — J3089 Other allergic rhinitis: Secondary | ICD-10-CM

## 2015-12-17 LAB — CBC WITH DIFFERENTIAL/PLATELET
BASOS ABS: 0 {cells}/uL (ref 0–200)
Basophils Relative: 0 %
EOS ABS: 160 {cells}/uL (ref 15–500)
Eosinophils Relative: 2 %
HEMATOCRIT: 44.8 % (ref 38.5–50.0)
HEMOGLOBIN: 15.4 g/dL (ref 13.2–17.1)
LYMPHS ABS: 2160 {cells}/uL (ref 850–3900)
Lymphocytes Relative: 27 %
MCH: 28.9 pg (ref 27.0–33.0)
MCHC: 34.4 g/dL (ref 32.0–36.0)
MCV: 84.2 fL (ref 80.0–100.0)
MONO ABS: 480 {cells}/uL (ref 200–950)
MPV: 10.1 fL (ref 7.5–12.5)
Monocytes Relative: 6 %
NEUTROS ABS: 5200 {cells}/uL (ref 1500–7800)
NEUTROS PCT: 65 %
Platelets: 169 10*3/uL (ref 140–400)
RBC: 5.32 MIL/uL (ref 4.20–5.80)
RDW: 14.4 % (ref 11.0–15.0)
WBC: 8 10*3/uL (ref 3.8–10.8)

## 2015-12-17 LAB — PSA: PSA: 1.8 ng/mL (ref <=4.0)

## 2015-12-17 LAB — HEPATIC FUNCTION PANEL
ALT: 43 U/L (ref 9–46)
AST: 36 U/L — AB (ref 10–35)
Albumin: 4 g/dL (ref 3.6–5.1)
Alkaline Phosphatase: 51 U/L (ref 40–115)
BILIRUBIN DIRECT: 0.1 mg/dL (ref <=0.2)
BILIRUBIN INDIRECT: 0.6 mg/dL (ref 0.2–1.2)
TOTAL PROTEIN: 7.2 g/dL (ref 6.1–8.1)
Total Bilirubin: 0.7 mg/dL (ref 0.2–1.2)

## 2015-12-17 LAB — BASIC METABOLIC PANEL WITH GFR
BUN: 18 mg/dL (ref 7–25)
CALCIUM: 10 mg/dL (ref 8.6–10.3)
CHLORIDE: 101 mmol/L (ref 98–110)
CO2: 25 mmol/L (ref 20–31)
CREATININE: 1.16 mg/dL (ref 0.70–1.25)
GFR, EST NON AFRICAN AMERICAN: 68 mL/min (ref >=60)
GFR, Est African American: 78 mL/min (ref >=60)
GLUCOSE: 130 mg/dL — AB (ref 65–99)
Potassium: 4 mmol/L (ref 3.5–5.3)
Sodium: 139 mmol/L (ref 135–146)

## 2015-12-17 LAB — LIPID PANEL
CHOL/HDL RATIO: 3.7 ratio (ref <=5.0)
CHOLESTEROL: 139 mg/dL (ref 125–200)
HDL: 38 mg/dL — ABNORMAL LOW (ref >=40)
LDL Cholesterol: 60 mg/dL (ref <130)
Triglycerides: 206 mg/dL — ABNORMAL HIGH (ref <150)
VLDL: 41 mg/dL — AB (ref <30)

## 2015-12-17 LAB — MAGNESIUM: Magnesium: 1.6 mg/dL (ref 1.5–2.5)

## 2015-12-17 LAB — HEMOGLOBIN A1C
HEMOGLOBIN A1C: 7.2 % — AB (ref <5.7)
MEAN PLASMA GLUCOSE: 160 mg/dL

## 2015-12-17 LAB — IRON AND TIBC
%SAT: 27 % (ref 15–60)
Iron: 98 ug/dL (ref 50–180)
TIBC: 369 ug/dL (ref 250–425)
UIBC: 271 ug/dL (ref 125–400)

## 2015-12-17 LAB — VITAMIN B12: Vitamin B-12: 705 pg/mL (ref 200–1100)

## 2015-12-17 LAB — TSH: TSH: 2.41 m[IU]/L (ref 0.40–4.50)

## 2015-12-17 NOTE — Progress Notes (Signed)
Complete Physical  Assessment and Plan:   1. Encounter for general adult medical examination with abnormal findings  - CBC with Differential/Platelet - BASIC METABOLIC PANEL WITH GFR - Hepatic function panel - Magnesium  2. Coronary artery disease involving native coronary artery of native heart without angina pectoris -followed by cards  3. Essential hypertension -cont meds - Urinalysis, Routine w reflex microscopic (not at [facility]) - Microalbumin / creatinine urine ratio - EKG 12-Lead - TSH  4. Seasonal and perennial allergic rhinitis -cont singulair -cont zyrtec -cont nasal saline  5. Asthma, mild intermittent, well-controlled -cont albuterol prn  6. Hemorrhage of rectum and anus -resolved  7. Ureteral calculus, right -followed by Dr. Tresa Moore  8. Hyperlipidemia -cont crestor -consider using zetia with it to meet goal - Lipid panel  9. Obesity -cont diet and exercise -avoid phentermine secondary to CAD  10. Pseudomonas aeruginosa colonization -followed by Dr. Tresa Moore  11. Right sided weakness  - MR Brain Wo Contrast; Future  12. Screening for deficiency anemia  - Iron and TIBC - Vitamin B12  13. Screening for prostate cancer -followed by Dr. Tresa Moore - PSA  14. Testosterone deficiency  - Testosterone  15. Vitamin D deficiency -cont Vit D - VITAMIN D 25 Hydroxy (Vit-D Deficiency, Fractures)  16. Screening for diabetes mellitus  - Hemoglobin A1c - Insulin, random   Discussed med's effects and SE's. Screening labs and tests as requested with regular follow-up as recommended.  [name]   His blood pressure has been controlled at home, today their BP is BP: 134/72 He does workout. He denies chest pain, shortness of breath, dizziness.   He is on cholesterol medication and denies myalgias. His cholesterol is not at goal. The cholesterol last visit was:   Lab Results  Component Value Date   CHOL 206 (H)  07/02/2015   HDL 38 (L) 07/02/2015   LDLCALC 124 07/02/2015   TRIG 221 (H) 07/02/2015   CHOLHDL 5.4 (H) 07/02/2015  He is still taking crestor.  He is not having any issues with it.    He has been working on diet and exercise for diabetes, he is not on bASA, he is on ACE/ARB and denies foot ulcerations, hyperglycemia, hypoglycemia , increased appetite, nausea, paresthesia of the feet, polydipsia, polyuria, visual disturbances, vomiting and weight loss. Last A1C in the office was:  Lab Results  Component Value Date   HGBA1C 7.2 (H) 07/02/2015  He reports that he has not been having any blurry vision or double vision.  He did have his eyes checked in April.    Patient is on Vitamin D supplement.   Lab Results  Component Value Date   VD25OH 26 (L) 12/11/2014      Last PSA was: Lab Results  Component Value Date   PSA 2.38 12/11/2014  .  Denies BPH symptoms daytime frequency, double voiding, dysuria, hematuria, hesitancy, incontinence, intermittency, nocturia, sensation of incomplete bladder emptying, suprapubic pain, urgency or weak urinary stream.  He reports that he is having some low energy level.  He reports that when it is hot out it is harder for him to exercise.    He does find that he is having some weakness in the right side of his body.  He reports that his wife is reporting that he cannot open the door well, can't tie a turnequet, has difficulty with writing.  He feels like it is very fatigued.  He does have family history of stroke.  He does feel that the right side of his body does tend to swell.    Current Medications:  Current Outpatient Prescriptions on File Prior to Visit  Medication Sig Dispense Refill  . albuterol (PROVENTIL HFA;VENTOLIN HFA) 108 (90 BASE) MCG/ACT inhaler Take 1 to 2 inhalations 4 x daily or every 4 hours if need to rescue asthma 18 g 99  . allopurinol (ZYLOPRIM) 300 MG tablet Take 1 tablet (300 mg total) by mouth daily. 90 tablet 2  . ALPRAZolam  (XANAX) 0.5 MG tablet TAKE 1 TABLET BY MOUTH 3 TIMES DAILY 90 tablet 2  . aspirin EC 81 MG tablet Take 81 mg by mouth at bedtime.    Marland Kitchen COLCRYS 0.6 MG tablet Take 1 tablet (0.6 mg total) by mouth 2 (two) times daily. 180 tablet 12  . CRESTOR 10 MG tablet Take 1 tablet (10 mg total) by mouth at bedtime. 90 tablet 3  . EPINEPHrine (EPIPEN 2-PAK) 0.3 mg/0.3 mL IJ SOAJ injection Inject 0.3 mLs (0.3 mg total) into the muscle once. 2 Device 1  . meloxicam (MOBIC) 15 MG tablet Take 1 tablet (15 mg total) by mouth daily. 30 tablet 1  . metFORMIN (GLUCOPHAGE) 500 MG tablet TAKE 2 TABLETS BY MOUTH 2 TIMES DAILY. 360 tablet 1  . Probiotic Product (PROBIOTIC DAILY PO) Take 1 tablet by mouth daily.    . valsartan-hydrochlorothiazide (DIOVAN-HCT) 320-25 MG per tablet Take 1 tablet by mouth daily. 90 tablet 2  . montelukast (SINGULAIR) 10 MG tablet Take 1 tablet (10 mg total) by mouth daily. 30 tablet prn   No current facility-administered medications on file prior to visit.     Health Maintenance:  Immunization History  Administered Date(s) Administered  . Influenza-Unspecified 02/03/2013, 02/05/2014  . Pneumococcal-Unspecified 11/05/2012  . Tdap 03/26/2015    Tetanus: 2016 Pneumovax: 2014 Flu vaccine: 2016 Colonoscopy: 2014 Eye Exam: Dr. Clydene Laming  [name], [name], [name], [name], [name]  Allergies: No Known Allergies  Medical History:  Past Medical History:  Diagnosis Date  . Adult BMI 50.0-59.9 kg/sq m    52.77  . Anal fissure   . Anxiety   . Diabetes mellitus without complication (HCC)    borderline  . GERD (gastroesophageal reflux disease)    30 years ago, maybe from peptic ulcer  . H/O: gout    STABLE  . Hepatic steatosis 10/01/11  . History of kidney stones   . Hyperlipidemia   . Hypertension   .  Kidney tumor    right upper kidney tumor, monitoring  . OSA on CPAP   . Right ureteral stone   . Vitamin D deficiency     Surgical History:  Past Surgical History:  Procedure Laterality Date  . ANAL FISSURE REPAIR  10-12-2000  . COLONOSCOPY N/A 07/01/2012   Procedure: COLONOSCOPY;  Surgeon: Lafayette Dragon, MD;  Location: [facility];  Service: Endoscopy;  Laterality: N/A;  . CYSTOSCOPY WITH RETROGRADE PYELOGRAM, URETEROSCOPY AND STENT PLACEMENT Bilateral 02/26/2013   Procedure: CYSTOSCOPY WITH RETROGRADE PYELOGRAM, URETEROSCOPY AND STENT PLACEMENT;  Surgeon: Alexis Frock, MD;  Location: [facility];  Service: Urology;  Laterality: Bilateral;  . HOLMIUM LASER APPLICATION Bilateral [id number]   Procedure: HOLMIUM LASER APPLICATION;  Surgeon: Alexis Frock, MD;  Location: [facility];  Service: Urology;  Laterality: Bilateral;  . kidney stone removal     06/2011-stent placement  .  LEFT MEDIAL FEMORAL CONDYLE DEBRIDEMENT & DRILLING/ REMOVAL LOOSE BODY  09-15-2003  . NASAL SEPTUM SURGERY  1985  . STONE EXTRACTION WITH BASKET  06/12/2011   Procedure: STONE EXTRACTION WITH BASKET;  Surgeon: Claybon Jabs, MD;  Location: [facility];  Service: Urology;  Laterality: Right;    Family History:  Family History  Problem Relation Age of Onset  . Heart disease Mother     Atrial fibrillation  . Hypertension Mother   . Hyperlipidemia Mother   . Lung cancer Father     was a smoker  . Cancer Father     lung  . Hyperlipidemia Brother   . Heart disease Maternal Aunt   . Hyperlipidemia Maternal Aunt   . Hypertension Maternal Aunt   . Stroke Maternal Aunt   . Heart disease Paternal Grandmother   . Hyperlipidemia Paternal Grandmother     Social History:   Social History  Substance Use Topics  . Smoking status: Never Smoker  . Smokeless tobacco: Never Used  . Alcohol use No    Review of Systems:  Review of Systems  Constitutional: Positive for diaphoresis and malaise/fatigue.  Negative for chills, fever and weight loss.  HENT: Negative for congestion, ear pain, hearing loss and sore throat.   Eyes: Negative.   Respiratory: Negative for cough, shortness of breath and wheezing.   Cardiovascular: Positive for leg swelling. Negative for chest pain, palpitations, orthopnea, claudication and PND.  Gastrointestinal: Negative for blood in stool, constipation, diarrhea, heartburn and melena.  Genitourinary: Negative.   Skin: Negative.   Neurological: Positive for sensory change and focal weakness. Negative for dizziness, loss of consciousness and headaches.  Psychiatric/Behavioral: Negative for depression and memory loss. The patient is not nervous/anxious and does not have insomnia.     Physical Exam: Estimated body mass index is 49.39 kg/m as calculated from the following:   Height as of this encounter: 5\' 6"  (1.676 m).   Weight as of this encounter: 306 lb (138.8 kg). BP 134/72   Pulse 74   Temp 98.4 F (36.9 C) (Temporal)   Resp 18   Ht 5\' 6"  (1.676 m)   Wt (!) 306 lb (138.8 kg)   BMI 49.39 kg/m   General Appearance: Well nourished, in no apparent distress.  Eyes: PERRLA, EOMs, conjunctiva no swelling or erythema ENT/Mouth: Ear canals clear bilaterally with no erythema, swelling, discharge.  TMs normal bilaterally with no erythema, bulging, or retractions.  Oropharynx clear and moist with no exudate, swelling, or erythema.  Dentition normal.   Neck: Supple, thyroid normal. No bruits, JVD, cervical adenopathy Respiratory: Respiratory effort normal, BS equal bilaterally without rales, rhonchi, wheezing or stridor.  Cardio: RRR without murmurs, rubs or gallops. Brisk peripheral pulses without edema.  Chest: symmetric, with normal excursions Abdomen: Soft, nontender, no guarding, rebound, hernias, masses, or organomegaly. Genitourinary:  Musculoskeletal: Full ROM all peripheral extremities,5/5 strength, and normal gait.  Skin: Warm, dry without rashes, lesions,  ecchymosis. Neuro: A&Ox3, Cranial nerves intact, normal gait.  Overshoot of the right hand with finger to nose.  Tremor of the left hand with finger opposition>tremor of the right hand with finger opposition.  Weakness in the right lower extremity 4/5 strength with knee flexion and plantar and dorsiflexion.   Psych: Normal affect, Insight and Judgment appropriate.   EKG: WNL no changes.  Over 40 minutes of exam, counseling, chart review and critical decision making was performed  Todd Rice 9:48 AM [facility]

## 2015-12-18 LAB — URINALYSIS, MICROSCOPIC ONLY
CASTS: NONE SEEN [LPF]
Crystals: NONE SEEN [HPF]
RBC / HPF: NONE SEEN RBC/HPF (ref <=2)
YEAST: NONE SEEN [HPF]

## 2015-12-18 LAB — URINALYSIS, ROUTINE W REFLEX MICROSCOPIC
Bilirubin Urine: NEGATIVE
Glucose, UA: NEGATIVE
Hgb urine dipstick: NEGATIVE
Ketones, ur: NEGATIVE
NITRITE: POSITIVE — AB
PH: 6 (ref 5.0–8.0)
Protein, ur: NEGATIVE
SPECIFIC GRAVITY, URINE: 1.013 (ref 1.001–1.035)

## 2015-12-18 LAB — VITAMIN D 25 HYDROXY (VIT D DEFICIENCY, FRACTURES): VIT D 25 HYDROXY: 28 ng/mL — AB (ref 30–100)

## 2015-12-18 LAB — TESTOSTERONE: Testosterone: 281 ng/dL (ref 250–827)

## 2015-12-18 LAB — INSULIN, RANDOM: INSULIN: 24.1 u[IU]/mL — AB (ref 2.0–19.6)

## 2015-12-18 LAB — MICROALBUMIN / CREATININE URINE RATIO
CREATININE, URINE: 107 mg/dL (ref 20–370)
MICROALB UR: 0.4 mg/dL
MICROALB/CREAT RATIO: 4 ug/mg{creat} (ref <30)

## 2015-12-20 ENCOUNTER — Other Ambulatory Visit: Payer: Self-pay | Admitting: Internal Medicine

## 2015-12-20 MED FILL — metFORMIN HCL 500 MG TABS: 500 | 90 days supply | Qty: 360 | Fill #1

## 2015-12-20 MED FILL — ALLOPURINOL 300 MG TABLET: 300 | 90 days supply | Qty: 90 | Fill #0

## 2015-12-20 MED FILL — VALSARTAN-HCTZ 320-25 MG TA: 320-25 | 90 days supply | Qty: 90 | Fill #0

## 2015-12-26 ENCOUNTER — Ambulatory Visit
Admission: RE | Admit: 2015-12-26 | Discharge: 2015-12-26 | Disposition: A | Discharge status: Home / Self Care | Payer: 59 | Source: Ambulatory Visit | Attending: Internal Medicine | Admitting: Internal Medicine

## 2015-12-26 DIAGNOSIS — R531 Weakness: Secondary | ICD-10-CM | POA: Diagnosis not present

## 2015-12-27 ENCOUNTER — Other Ambulatory Visit: Payer: Self-pay | Admitting: Internal Medicine

## 2015-12-27 DIAGNOSIS — R531 Weakness: Secondary | ICD-10-CM

## 2016-01-04 ENCOUNTER — Encounter: Payer: Self-pay | Admitting: Neurology

## 2016-01-04 ENCOUNTER — Ambulatory Visit (INDEPENDENT_AMBULATORY_CARE_PROVIDER_SITE_OTHER): Payer: 59 | Admitting: Neurology

## 2016-01-04 DIAGNOSIS — G20 Parkinson's disease: Secondary | ICD-10-CM

## 2016-01-04 DIAGNOSIS — G20A1 Parkinson's disease without dyskinesia, without mention of fluctuations: Secondary | ICD-10-CM

## 2016-01-04 HISTORY — DX: Parkinson's disease: G20

## 2016-01-04 HISTORY — DX: Parkinson's disease without dyskinesia, without mention of fluctuations: G20.A1

## 2016-01-04 MED ORDER — PRAMIPEXOLE DIHYDROCHLORIDE 0.125 MG PO TABS: ORAL_TABLET | ORAL | 2 refills | Status: DC

## 2016-01-04 MED FILL — PRAMIPEXOLE 0.125 MG TABLET: 0.125 | 30 days supply | Qty: 180 | Fill #0

## 2016-01-04 NOTE — Progress Notes (Signed)
Reason for visit: Right-sided weakness  Referring physician: Dr. Janeece Riggers is a 60 y.o. male  History of present illness:  Mr. Glassford is a 60 year old right-handed white male with a history of diabetes and obesity. The patient has noted onset within the last 3 months of some difficulty using his right arm. He has noted a change in his handwriting which has become quite small and sloppy. He has the sensation that he may be dragging his right leg. The patient denies any falls, he denies any weakness of the extremities or any numbness. The patient has had some alteration in speech at times, he will have a low amplitude speech, and his wife has indicated that he may slurred speech at times. The patient has been set up for MRI evaluation of the brain which is relatively unremarkable, very minimal white matter changes are noted. The patient is sent to this office for an evaluation.  Past Medical History:  Diagnosis Date  . Adult BMI 50.0-59.9 kg/sq m    52.77  . Anal fissure   . Anxiety   . Diabetes mellitus without complication (HCC)    borderline  . GERD (gastroesophageal reflux disease)    30 years ago, maybe from peptic ulcer  . H/O: gout    STABLE  . Hepatic steatosis 10/01/11  . History of kidney stones   . Hyperlipidemia   . Hypertension   . Kidney tumor    right upper kidney tumor, monitoring  . OSA on CPAP   . Right ureteral stone   . Vitamin D deficiency   . Weakness     Past Surgical History:  Procedure Laterality Date  . ANAL FISSURE REPAIR  10-12-2000  . COLONOSCOPY N/A 07/01/2012   Procedure: COLONOSCOPY;  Surgeon: Lafayette Dragon, MD;  Location: [facility];  Service: Endoscopy;  Laterality: N/A;  . CYSTOSCOPY WITH RETROGRADE PYELOGRAM, URETEROSCOPY AND STENT PLACEMENT Bilateral 02/26/2013   Procedure: CYSTOSCOPY WITH RETROGRADE PYELOGRAM, URETEROSCOPY AND STENT PLACEMENT;  Surgeon: Alexis Frock, MD;  Location: [facility];  Service: Urology;   Laterality: Bilateral;  . HOLMIUM LASER APPLICATION Bilateral [id number]   Procedure: HOLMIUM LASER APPLICATION;  Surgeon: Alexis Frock, MD;  Location: [facility];  Service: Urology;  Laterality: Bilateral;  . kidney stone removal     06/2011-stent placement  . LEFT MEDIAL FEMORAL CONDYLE DEBRIDEMENT & DRILLING/ REMOVAL LOOSE BODY  09-15-2003  . NASAL SEPTUM SURGERY  1985  . STONE EXTRACTION WITH BASKET  06/12/2011   Procedure: STONE EXTRACTION WITH BASKET;  Surgeon: Claybon Jabs, MD;  Location: [facility];  Service: Urology;  Laterality: Right;    Family History  Problem Relation Age of Onset  . Heart disease Mother     Atrial fibrillation  . Hypertension Mother   . Hyperlipidemia Mother   . Lung cancer Father     was a smoker  . Cancer Father     lung  . Hyperlipidemia Brother   . Heart disease Maternal Aunt   . Hyperlipidemia Maternal Aunt   . Hypertension Maternal Aunt   . Stroke Maternal Aunt   . Heart disease Paternal Grandmother   . Hyperlipidemia Paternal Grandmother     Social history:  reports that he has never smoked. He has never used smokeless tobacco. He reports that he does not drink alcohol or use drugs.  Medications:  Prior to Admission medications   Medication Sig Start Date End Date Taking? Authorizing Provider  allopurinol (ZYLOPRIM) 300  MG tablet TAKE 1 TABLET BY MOUTH DAILY. 12/20/15  Yes Unk Pinto, MD  ALPRAZolam Duanne Moron) 0.5 MG tablet TAKE 1 TABLET BY MOUTH 3 TIMES DAILY 07/20/15  Yes Unk Pinto, MD  aspirin EC 81 MG tablet Take 81 mg by mouth at bedtime.   Yes Historical Provider, MD  COLCRYS 0.6 MG tablet Take 1 tablet (0.6 mg total) by mouth 2 (two) times daily. 12/11/14  Yes Courtney Forcucci, PA-C  CRESTOR 10 MG tablet Take 1 tablet (10 mg total) by mouth at bedtime. 03/01/15  Yes Unk Pinto, MD  EPINEPHrine (EPIPEN 2-PAK) 0.3 mg/0.3 mL IJ SOAJ injection Inject 0.3 mLs (0.3 mg total) into the muscle once. 03/27/14  Yes  Melissa Smith, PA-C  metFORMIN (GLUCOPHAGE) 500 MG tablet TAKE 2 TABLETS BY MOUTH 2 TIMES DAILY. 08/13/15  Yes Unk Pinto, MD  Probiotic Product (PROBIOTIC DAILY PO) Take 1 tablet by mouth daily.   Yes Historical Provider, MD  valsartan-hydrochlorothiazide (DIOVAN-HCT) 320-25 MG tablet TAKE 1 TABLET BY MOUTH DAILY. 12/20/15  Yes Unk Pinto, MD  albuterol (PROVENTIL HFA;VENTOLIN HFA) 108 (90 BASE) MCG/ACT inhaler Take 1 to 2 inhalations 4 x daily or every 4 hours if need to rescue asthma 12/15/14 12/17/15  Unk Pinto, MD     No Known Allergies  ROS:  Out of a complete 14 system review of symptoms, the patient complains only of the following symptoms, and all other reviewed systems are negative.  Fatigue Swelling in the legs Blurred vision Cough Blood in the stools, constipation Moles Feeling hot Muscle cramps Allergies, runny nose Numbness, weakness, tremor Anxiety, decreased energy  Blood pressure (!) 141/69, pulse 85, height 5\' 6"  (1.676 m), weight (!) 302 lb 8 oz (137.2 kg).  Physical Exam  General: The patient is alert and cooperative at the time of the examination. The patient is markedly obese.  Eyes: Pupils are equal, round, and reactive to light. Discs are flat bilaterally.  Neck: The neck is supple, no carotid bruits are noted.  Respiratory: The respiratory examination is clear.  Cardiovascular: The cardiovascular examination reveals a regular rate and rhythm, no obvious murmurs or rubs are noted.  Skin: Extremities are without significant edema.  Neurologic Exam  Mental status: The patient is alert and oriented x 3 at the time of the examination. The patient has apparent normal recent and remote memory, with an apparently normal attention span and concentration ability.  Cranial nerves: Facial symmetry is present. There is good sensation of the face to pinprick and soft touch bilaterally. The strength of the facial muscles and the muscles to head turning  and shoulder shrug are normal bilaterally. Speech is well enunciated, no aphasia or dysarthria is noted. Extraocular movements are full. Visual fields are full. The tongue is midline, and the patient has symmetric elevation of the soft palate. No obvious hearing deficits are noted. Masking of the face is seen. The patient appears to have lid lag with inferior gaze.  Motor: The motor testing reveals 5 over 5 strength of all 4 extremities. Good symmetric motor tone is noted throughout.  Sensory: Sensory testing is intact to pinprick, soft touch, vibration sensation, and position sense on all 4 extremities. No evidence of extinction is noted.  Coordination: Cerebellar testing reveals good finger-nose-finger and heel-to-shin bilaterally.  Gait and station: The patient is able to rise from a seated position with arms crossed. Once up, he is able to ambulate independently. The patient has decreased arm swing bilaterally, most prominent on the right. No tremor  seen. Tandem gait is normal. Romberg is negative. No drift is seen.  Reflexes: Deep tendon reflexes are symmetric and normal bilaterally. Toes are downgoing bilaterally.   MRI brain 12/26/15:  IMPRESSION: Mild chronic microvascular ischemic change. No acute intracranial findings.  Within limits for detection due to noncontrast examination, no intracranial metastatic disease is observed   * MRI scan images were reviewed online. I agree with the written report.    Assessment/Plan:  1. Parkinson's disease  The patient appears to be developing signs of Parkinson's disease with masking of the face, micrographia, decreased arm swing right greater than left, and some alteration in speech. The patient will be placed on low-dose Mirapex working up to 0.25 mg 3 times daily. If he can tolerate higher dose, he is to contact our office. He will follow-up in 3-4 months.   Jill Alexanders MD 01/04/2016 10:22 AM  [facility] [address] [place], [place] [address]  Phone [phone] Fax [phone]

## 2016-01-04 NOTE — Patient Instructions (Addendum)
    Mirapex (pramipexole) may result in confusion or hallucinations, dizziness, drowsiness, or nausea. If any significant side effects are noted, please contact our office.  Parkinson Disease Parkinson disease is a disorder of the central nervous system, which includes the brain and spinal cord. A person with this disease slowly loses the ability to completely control body movements. Within the brain, there is a group of nerve cells (basal ganglia) that help control movement. The basal ganglia are damaged and do not work properly in a person with Parkinson disease. In addition, the basal ganglia produce and use a brain chemical called dopamine. The dopamine chemical sends messages to other parts of the body to control and coordinate body movements. Dopamine levels are low in a person with Parkinson disease. If the dopamine levels are low, then the body does not receive the correct messages it needs to move normally.  CAUSES  The exact reason why the basal ganglia get damaged is not known. Some medical researchers have thought that infection, genes, environment, and certain medicines may contribute to the cause.  SYMPTOMS   An early symptom of Parkinson disease is often an uncontrolled shaking (tremor) of the hands. The tremor will often disappear when the affected hand is consciously used.  As the disease progresses, walking, talking, getting out of a chair, and new movements become more difficult.  Muscles get stiff and movements become slower.  Balance and coordination become harder.  Depression, trouble swallowing, urinary problems, constipation, and sleep problems can occur.  Later in the disease, memory and thought processes may deteriorate. DIAGNOSIS  There are no specific tests to diagnose Parkinson disease. You may be referred to a neurologist for evaluation. Your caregiver will ask about your medical history, symptoms, and perform a physical exam. Blood tests and imaging tests of your  brain may be performed to rule out other diseases. The imaging tests may include an MRI or a CT scan. TREATMENT  The goal of treatment is to relieve symptoms. Medicines may be prescribed once the symptoms become troublesome. Medicine will not stop the progression of the disease, but medicine can make movement and balance better and help control tremors. Speech and occupational therapy may also be prescribed. Sometimes, surgical treatment of the brain can be done in young people. HOME CARE INSTRUCTIONS  Get regular exercise and rest periods during the day to help prevent exhaustion and depression.  If getting dressed becomes difficult, replace buttons and zippers with Velcro and elastic on your clothing.  Take all medicine as directed by your caregiver.  Install grab bars or railings in your home to prevent falls.  Go to speech or occupational therapy as directed.  Keep all follow-up visits as directed by your caregiver. SEEK MEDICAL CARE IF:  Your symptoms are not controlled with your medicine.  You fall.  You have trouble swallowing or choke on your food. MAKE SURE YOU:  Understand these instructions.  Will watch your condition.  Will get help right away if you are not doing well or get worse.   This information is not intended to replace advice given to you by your health care provider. Make sure you discuss any questions you have with your health care provider.   Document Released: 04/21/2000 Document Revised: 08/19/2012 Document Reviewed: 05/24/2011 Elsevier Interactive Patient Education [employer].

## 2016-01-31 ENCOUNTER — Ambulatory Visit (INDEPENDENT_AMBULATORY_CARE_PROVIDER_SITE_OTHER): Payer: 59 | Admitting: Neurology

## 2016-01-31 ENCOUNTER — Encounter: Payer: Self-pay | Admitting: Neurology

## 2016-01-31 DIAGNOSIS — R490 Dysphonia: Secondary | ICD-10-CM

## 2016-01-31 DIAGNOSIS — G4733 Obstructive sleep apnea (adult) (pediatric): Secondary | ICD-10-CM | POA: Diagnosis not present

## 2016-01-31 DIAGNOSIS — R1310 Dysphagia, unspecified: Secondary | ICD-10-CM

## 2016-01-31 DIAGNOSIS — F518 Other sleep disorders not due to a substance or known physiological condition: Secondary | ICD-10-CM | POA: Diagnosis not present

## 2016-01-31 DIAGNOSIS — Z9989 Dependence on other enabling machines and devices: Secondary | ICD-10-CM

## 2016-01-31 DIAGNOSIS — G20 Parkinson's disease: Secondary | ICD-10-CM | POA: Diagnosis not present

## 2016-01-31 NOTE — Progress Notes (Signed)
SLEEP MEDICINE CLINIC   Provider:  Larey Seat, M D  Referring Provider: Unk Pinto, MD Primary Care Physician:  Alesia Richards, MD  Chief Complaint  Patient presents with  . New Patient (Initial Visit)    on cpap, uses [employer], does not have a copy of diagnostic PSG, cannot download cpap    HPI:  Todd Rice is a 60 y.o. male , seen here as a referral  from Dr. Melford Aase for a sleep evaluation,  Todd Rice has just recently met with Dr. Floyde Parkins who diagnosed him with Parkinson's disease and started him on it 3 times daily dose of Sinemet. Meanwhile he has taken 6 doses a day., He has a slightly hoarse voice, pycnic body build, and works for the [employer]. He has a interesting medical history of a long-term Pseudomonas infection contracted during a surgery. He had kidney surgery to clean out the infectious areas. February  2013. He was just diagnosed with a mass on the right kidney.  Sleep study:  On the seventh of every 2007 this patient was evaluated at the [facility] hardened sleep center for CPAP titration. His baseline sleep study could not be located and I have only the technical data to report here. He had a sleep efficiency of 78.6%, 7 apneas and 26 hypopnea spells, he was titrated beginning at 8 cm water with an AHI of 120/h and step-by-step the pressure was increased to 14 cm with an AHI of 2.06 per hour. The oxygen nadir was 85% during REM sleep. I'm still not sure that he was ever completely titrated. He slept supine the entire night.  Chief complaint according to patient : I need new supplies.   Sleep habits are as follows: He goes to bed around 10:30 PM, rises at 5:30 AM, interrupted by one bathroom break between 2 and 2:30 AM. The patient prefers to sleep supine, he sleeps on 2 pillows for head support and 1 pillow each side of his body. He is sure to get 7 hours of sleep. He is daytime fatigued.  The marital bedroom is described as  cool, dark and quiet, profound running in the background. There is no TV in the bedroom, but the family dog sleeps in the bedroom. He wakes without headaches, has a dry mouth, while on CPAP. His wife has noted him to act out dreams, banging or punching on a pillow, loud yelling in his sleep, defending himself. He doesn't frequently . wake up from this activity. The dreams are sometimes so real that they scare him. It's hard to differentiate dream from reality.   Sleep medical history and family sleep history:  No OSA and No PD.   Social history: married, worked in health care,   Dr [name] note : History of present illness:  Todd Rice is a 60 year old right-handed white male with a history of diabetes and obesity. The patient has noted onset within the last 3 months of some difficulty using his right arm. He has noted a change in his handwriting which has become quite small and sloppy. He has the sensation that he may be dragging his right leg. The patient denies any falls, he denies any weakness of the extremities or any numbness. The patient has had some alteration in speech at times, he will have a low amplitude speech, and his wife has indicated that he may slurred speech at times. The patient has been set up for MRI evaluation of the brain which is relatively  unremarkable, very minimal white matter changes are noted. The patient is sent to this office for an evaluation. Out of a complete 14 system review of symptoms, the patient complains only of the following symptoms, and all other reviewed systems are negative.  Fatigue Swelling in the legs Blurred vision Cough Blood in the stools, constipation Moles Feeling hot Muscle cramps Allergies, runny nose Numbness, weakness, tremor Anxiety,  Fatigue , decreased energy  Blood pressure (!) 140/98, pulse 64, resp. rate 20, height '5\' 7"'$  (1.702 m), weight (!) 303 lb (137.4 kg).  Physical Exam  Todd Rice has a high-grade Mallampati, a 22  inch neck circumference, no retrognathia, intact shoulder shrug bilaterally.   General: The patient is alert and cooperative at the time of the examination. The patient is markedly obese. Eyes: Pupils are equal, round, and reactive to light. Discs are flat bilaterally. Neck: stiffness , restricted ROM.  no carotid bruits are noted. Respiratory: The respiratory examination is clear. Cardiovascular: regular rate and rhythm, no obvious murmurs or rubs are noted. Skin: Extremities are without significant edema. Neurologic Exam  Mental status: The patient is alert and oriented x 3 , apparent normal recent and remote memory, with normal attention span . Cranial nerves: Facial symmetry is present. There is good sensation of the face to pinprick and soft touch bilaterally. The strength of the facial muscles and the muscles to head turning and shoulder shrug are normal bilaterally. Speech is well enunciated, no aphasia or dysarthria is noted. Extraocular movements are full. Visual fields are full. The tongue is midline, and the patient has symmetric elevation of the soft palate. No  hearing deficits are noted. Masking of the face is seen. The patient appears to have lid lag with inferior gaze. Motor: The motor testing reveals 5 / 5 strength  - elevated  motor tone is noted throughout. Right hand is clumsy, rapid alternating movements at re impaired, handwriting, key turning are difficult .  Sensory:  intact to pinprick, soft touch, vibration sensation. Coordination:  finger-nose-finger bilaterally intact, no tremor.  Gait and station: The patient is able to rise from a seated position without bracing / arms crossed and he is able to ambulate independently.  The patient has decreased arm swing bilaterally, most prominent on the right. No tremor seen.  Romberg is negative. No drift is seen.  Reflexes: Deep tendon reflexes are symmetric and normal bilaterally. Toes are downgoing bilaterally.   MRI brain  12/26/15:  IMPRESSION: Mild chronic microvascular ischemic change. No acute intracranial findings.  Within limits for detection due to noncontrast examination, no intracranial metastatic disease is observed   * MRI scan images were reviewed online. I agree with the written report.    Epworth score 4 , Fatigue severity score 30   , depression score 6/15   Social History   Social History  . Marital status: Married    Spouse name: N/A  . Number of children: 0  . Years of education: N/A   Occupational History  . Not on file.   Social History Main Topics  . Smoking status: Never Smoker  . Smokeless tobacco: Never Used  . Alcohol use No  . Drug use: No     Comment: QUIT SMOKING " POT" 40 YRS AGO  . Sexual activity: Not on file   Other Topics Concern  . Not on file   Social History Narrative   Right-handed.   No caffeine use.   Lives at home with his wife.  Family History  Problem Relation Age of Onset  . Heart disease Mother     Atrial fibrillation  . Hypertension Mother   . Hyperlipidemia Mother   . Lung cancer Father     was a smoker  . Cancer Father     lung  . Hyperlipidemia Brother   . Heart disease Maternal Aunt   . Hyperlipidemia Maternal Aunt   . Hypertension Maternal Aunt   . Stroke Maternal Aunt   . Heart disease Paternal Grandmother   . Hyperlipidemia Paternal Grandmother     Past Medical History:  Diagnosis Date  . Adult BMI 50.0-59.9 kg/sq m    52.77  . Anal fissure   . Anxiety   . Diabetes mellitus without complication (HCC)    borderline  . GERD (gastroesophageal reflux disease)    30 years ago, maybe from peptic ulcer  . H/O: gout    STABLE  . Hepatic steatosis 10/01/11  . History of kidney stones   . Hyperlipidemia   . Hypertension   . Kidney tumor    right upper kidney tumor, monitoring  . OSA on CPAP   . Parkinson disease ([place]) 01/04/2016  . Right ureteral stone   . Vitamin D deficiency   . Weakness     Past Surgical  History:  Procedure Laterality Date  . ANAL FISSURE REPAIR  10-12-2000  . COLONOSCOPY N/A 07/01/2012   Procedure: COLONOSCOPY;  Surgeon: Lafayette Dragon, MD;  Location: [facility];  Service: Endoscopy;  Laterality: N/A;  . CYSTOSCOPY WITH RETROGRADE PYELOGRAM, URETEROSCOPY AND STENT PLACEMENT Bilateral 02/26/2013   Procedure: CYSTOSCOPY WITH RETROGRADE PYELOGRAM, URETEROSCOPY AND STENT PLACEMENT;  Surgeon: Alexis Frock, MD;  Location: [facility];  Service: Urology;  Laterality: Bilateral;  . HOLMIUM LASER APPLICATION Bilateral 24/82/5003   Procedure: HOLMIUM LASER APPLICATION;  Surgeon: Alexis Frock, MD;  Location: [facility];  Service: Urology;  Laterality: Bilateral;  . kidney stone removal     06/2011-stent placement  . LEFT MEDIAL FEMORAL CONDYLE DEBRIDEMENT & DRILLING/ REMOVAL LOOSE BODY  09-15-2003  . NASAL SEPTUM SURGERY  1985  . STONE EXTRACTION WITH BASKET  06/12/2011   Procedure: STONE EXTRACTION WITH BASKET;  Surgeon: Claybon Jabs, MD;  Location: [facility];  Service: Urology;  Laterality: Right;    Current Outpatient Prescriptions  Medication Sig Dispense Refill  . allopurinol (ZYLOPRIM) 300 MG tablet TAKE 1 TABLET BY MOUTH DAILY. 90 tablet 2  . ALPRAZolam (XANAX) 0.5 MG tablet TAKE 1 TABLET BY MOUTH 3 TIMES DAILY 90 tablet 2  . aspirin EC 81 MG tablet Take 81 mg by mouth at bedtime.    Marland Kitchen COLCRYS 0.6 MG tablet Take 1 tablet (0.6 mg total) by mouth 2 (two) times daily. 180 tablet 12  . CRESTOR 10 MG tablet Take 1 tablet (10 mg total) by mouth at bedtime. 90 tablet 3  . EPINEPHrine (EPIPEN 2-PAK) 0.3 mg/0.3 mL IJ SOAJ injection Inject 0.3 mLs (0.3 mg total) into the muscle once. 2 Device 1  . metFORMIN (GLUCOPHAGE) 500 MG tablet TAKE 2 TABLETS BY MOUTH 2 TIMES DAILY. 360 tablet 1  . pramipexole (MIRAPEX) 0.125 MG tablet One tablet three times a day for 3 weeks, then take 2 tablets 3 times a day 180 tablet 2  . Probiotic Product (PROBIOTIC DAILY PO) Take 1 tablet by  mouth daily.    . valsartan-hydrochlorothiazide (DIOVAN-HCT) 320-25 MG tablet TAKE 1 TABLET BY MOUTH DAILY. 90 tablet 2  . albuterol (PROVENTIL HFA;VENTOLIN HFA) 108 (  90 BASE) MCG/ACT inhaler Take 1 to 2 inhalations 4 x daily or every 4 hours if need to rescue asthma 18 g 99   No current facility-administered medications for this visit.     Allergies as of 01/31/2016  . (No Known Allergies)    Vitals: BP (!) 140/98   Pulse 64   Resp 20   Ht '5\' 7"'$  (1.702 m)   Wt (!) 303 lb (137.4 kg)   BMI 47.46 kg/m  Last Weight:  Wt Readings from Last 1 Encounters:  01/31/16 (!) 303 lb (137.4 kg)   WLN:LGXQ mass index is 47.46 kg/m.     Last Height:   Ht Readings from Last 1 Encounters:  01/31/16 '5\' 7"'$  (1.702 m)     The patient was advised of the nature of the diagnosed sleep disorder , the treatment options and risks for general a health and wellness arising from not treating the condition.  I spent more than 50 minutes of face to face time with the patient. Greater than 50% of time was spent in counseling and coordination of care. We have discussed the diagnosis and differential and I answered the patient's questions.     Assessment:  After physical and neurologic examination, review of laboratory studies,  Personal review of imaging studies, reports of other /same  Imaging studies ,  Results of polysomnography/ neurophysiology testing and pre-existing records as far as provided in visit., my assessment is   1) Todd Rice suffers from Parkinson's disease which was recently diagnosed in the year 2017 and he is on an ascending dose titration to carbidopa levodopa. He has noted benefit in terms of less rigidity when waking up in the morning and improved range of motion for his neck, he does not suffer from tremor but rather feels that he has some clumsiness and loss of fine motor skills with his right, dominant hand.  2) Todd Rice was diagnosed with obstructive sleep apnea also his baseline  sleep study has not been obtained the only found his titration study and not the narrative report. Based on those data I know that he was titrated to 14 cm water but incomplete resolution of apnea was also noted his residual AHI was still 2.6 he still had hypoxemia with a nadir of 85% oxygen during REM sleep, and he has not had downloads or regular follow-ups on CPAP.  3) social his wife has not just witnessed that her husband was apneic and snore before using CPAP but she has also noted that he has developed the tendency to act out dreams at times sometimes very vivid and lifelike dreams. REM behavior disorder seems to have started just a couple of years ago. It has a 70% prediction for male patients to progress into Parkinson's disease.  4) morbid obesity    Plan:  Treatment plan and additional workup :  I will invite Todd Rice for an attended sleep study as a split-night polysomnography my goal is to establish the new baseline for apnea as well as to titrate him to the pressure that seems to be optimal at this time in his life and he is an urgent need of a new CPAP machine. I would like to choose a machine that could be used a CPAP as well as BiPAP if needed and that is wi fi compatible and allows therapeutic data to be downloaded. He considers himself a mouth breather and probably will need a full face mask I would also like the lab to provide  an Public affairs consultant.  To allow enough time for titration the patient should be invited as one of the early sleep arrival. Rv after sleep study.    Asencion Partridge Akila Batta MD  01/31/2016   CC: Unk Pinto, [place], [place] [address]   Lenor Coffin, MD

## 2016-02-08 MED FILL — PRAMIPEXOLE 0.125 MG TABLET: 0.125 | 30 days supply | Qty: 180 | Fill #1

## 2016-02-09 ENCOUNTER — Ambulatory Visit: Payer: 59 | Attending: Neurology | Admitting: Occupational Therapy

## 2016-02-09 DIAGNOSIS — R29898 Other symptoms and signs involving the musculoskeletal system: Secondary | ICD-10-CM | POA: Insufficient documentation

## 2016-02-09 DIAGNOSIS — R471 Dysarthria and anarthria: Secondary | ICD-10-CM | POA: Diagnosis not present

## 2016-02-09 DIAGNOSIS — R2681 Unsteadiness on feet: Secondary | ICD-10-CM | POA: Diagnosis not present

## 2016-02-09 DIAGNOSIS — R29818 Other symptoms and signs involving the nervous system: Secondary | ICD-10-CM | POA: Insufficient documentation

## 2016-02-09 DIAGNOSIS — R293 Abnormal posture: Secondary | ICD-10-CM | POA: Diagnosis not present

## 2016-02-09 DIAGNOSIS — R278 Other lack of coordination: Secondary | ICD-10-CM | POA: Insufficient documentation

## 2016-02-09 DIAGNOSIS — R2689 Other abnormalities of gait and mobility: Secondary | ICD-10-CM | POA: Diagnosis not present

## 2016-02-10 ENCOUNTER — Telehealth: Payer: Self-pay | Admitting: Neurology

## 2016-02-10 NOTE — Telephone Encounter (Signed)
FMLA paperwork was faxed to my direct fax (I'm not sure how this came about). Pt needs to sign a MR waiver and pay the $50 fee right?

## 2016-02-10 NOTE — Telephone Encounter (Signed)
Pt called said he went to OT yesterday and was told he will need PT,OT and speech 2 x week for 8 weeks. His supervisor has advised he needs to file FMLA. He will call today to get this started  [facility]

## 2016-02-10 NOTE — Telephone Encounter (Signed)
It appears Dr. [name] follows pt for Parkinson's disease. Dr. Brett Fairy sees the pt for sleep but referred him to neurorehab for PT/OT/ST for parkinson's.

## 2016-02-10 NOTE — Therapy (Signed)
[facility] [address] [place], [place], [address] Phone: [phone]   Fax:  [phone]  Occupational Therapy Evaluation  Patient Details  Name: Todd Rice MRN: [id number] Date of Birth: Mar 26, 1956 Referring Provider: Dr. [name]  Encounter Date: 02/09/2016      OT End of Session - 02/10/16 1220    Visit Number 1   Number of Visits 17   Date for OT Re-Evaluation 04/08/16   Authorization Type [facility] [employer]   OT Start Time 1535   OT Stop Time 1620   OT Time Calculation (min) 45 min   Activity Tolerance Patient tolerated treatment well   Behavior During Therapy [facility] for tasks assessed/performed      Past Medical History:  Diagnosis Date  . Adult BMI 50.0-59.9 kg/sq m    52.77  . Anal fissure   . Anxiety   . Diabetes mellitus without complication (HCC)    borderline  . GERD (gastroesophageal reflux disease)    30 years ago, maybe from peptic ulcer  . H/O: gout    STABLE  . Hepatic steatosis 10/01/11  . History of kidney stones   . Hyperlipidemia   . Hypertension   . Kidney tumor    right upper kidney tumor, monitoring  . OSA on CPAP   . Parkinson disease ([place]) 01/04/2016  . Right ureteral stone   . Vitamin D deficiency   . Weakness     Past Surgical History:  Procedure Laterality Date  . ANAL FISSURE REPAIR  10-12-2000  . COLONOSCOPY N/A 07/01/2012   Procedure: COLONOSCOPY;  Surgeon: Lafayette Dragon, MD;  [facility];  Service: Endoscopy;  Laterality: N/A;  . CYSTOSCOPY WITH RETROGRADE PYELOGRAM, URETEROSCOPY AND STENT PLACEMENT Bilateral 02/26/2013   Procedure: CYSTOSCOPY WITH RETROGRADE PYELOGRAM, URETEROSCOPY AND STENT PLACEMENT;  Surgeon: Alexis Frock, MD;  [facility];  Service: Urology;  Laterality: Bilateral;  . HOLMIUM LASER APPLICATION Bilateral [id number]   Procedure: HOLMIUM LASER APPLICATION;  Surgeon: Alexis Frock, MD;  [facility];  Service: Urology;  Laterality:  Bilateral;  . kidney stone removal     06/2011-stent placement  . LEFT MEDIAL FEMORAL CONDYLE DEBRIDEMENT & DRILLING/ REMOVAL LOOSE BODY  09-15-2003  . NASAL SEPTUM SURGERY  1985  . STONE EXTRACTION WITH BASKET  06/12/2011   Procedure: STONE EXTRACTION WITH BASKET;  Surgeon: Claybon Jabs, MD;  Location: [facility];  Service: Urology;  Laterality: Right;    There were no vitals filed for this visit.      Subjective Assessment - 02/09/16 1542    Subjective  Pt reports he started having symptoms 3 mons ago, with weakness in right arm, decreased arm swing, newly diagnosed with PD by Dr. [name] 60   Patient Stated Goals improve function in right hand   Currently in Pain? Yes   Pain Score 7    Pain Location Arm   Pain Orientation Right;Left   Pain Descriptors / Indicators Aching;Tightness   Pain Type Acute pain   Pain Onset 1 to 4 weeks ago   Pain Frequency Intermittent   Aggravating Factors  mornings , not moving    Pain Relieving Factors moving.           [facility] OT Assessment - 02/09/16 1744      Assessment   Diagnosis Parkinson's disease   Referring Provider Dr. [name]   Onset Date 01/04/16     Precautions   Precautions Fall     Balance Screen  Has the patient fallen in the past 6 months No   Has the patient had a decrease in activity level because of a fear of falling?  No   Is the patient reluctant to leave their home because of a fear of falling?  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   Leisure golf, ride bikes     ADL   Eating/Feeding Modified independent   Grooming Modified independent   Lower Body Bathing Modified independent   Upper Body Dressing Independent   Lower Body Dressing Minimal assistance  occasional    Toilet Tranfer Modified independent   Tub/Shower Transfer Modified independent     IADL   Light Housekeeping Performs light daily tasks such as dishwashing, bed making   Meal Prep Able to complete simple warm meal prep   Medication Management Is responsible for taking medication in correct dosages at correct time   Financial Management --  Pt's wife assist's      Mobility   Mobility Status Independent     Written Expression   Dominant Hand Right   Handwriting Severe micrographia;50% legible     Vision - History   Baseline Vision Wears glasses all the time     Vision Assessment   Vision Assessment Vision not tested     Cognition   Overall Cognitive Status Cognition to be further assessed in functional context PRN  Pt report occaisonally he forgets what he wants to say     Observation/Other Assessments   Other Surveys  Select   Physical Performance Test   Yes   Simulated Eating Time (seconds) 12.28 secs   Donning Doffing Jacket Time (seconds) 18.16 secs   Donning Doffing Jacket Comments 3 button/ unbutton: 60 secs     Coordination   Fine Motor Movements are Fluid and Coordinated No   Coordination and Movement Description RUE more impaired than left   9 Hole Peg Test Right;Left   Right 9 Hole Peg Test 39.81 secs   Left 9 Hole Peg Test 30.72 secs   Box and Blocks RUE 39 blocks, LUE 48 blocks     Tone   Assessment Location Right Upper Extremity     ROM / Strength   AROM / PROM / Strength AROM     AROM   Overall AROM Comments Grossly WFLS, pt maintins right elbow and hand in flexion at rest     RUE Tone   RUE Tone --  mild rigidity                           OT Short Term Goals - 02/10/16 1222      OT SHORT TERM GOAL #1   Title I with PD  specific HEP.   Time 4   Period Weeks   Status New     OT SHORT TERM GOAL #2   Title I with adapted strategies for ADLs/ IADLs.   Time 4   Period Weeks   Status New     OT SHORT TERM GOAL #3   Title  Pt will demonstrate ability to write a sentence with 95% legibility, good letter size with only minimal decrease in  letter size.   Time 4   Period Weeks   Status New     OT SHORT TERM GOAL #4   Title Pt will demonstrate improved RUE functional use as evidenced by increasing box/ blocks score to 43 blocks or greater.   Baseline RUE 39 blocks, LUE 48 blocks   Time 4   Period Weeks   Status New     OT SHORT TERM GOAL #5   Title test standing functional reach and set goal PRN   Time 4   Period Weeks   Status New           OT Long Term Goals - 02/10/16 1223      OT LONG TERM GOAL #1   Title Pt will verbalize understanding of community resources and ways to prevent future PD related  complications.   Time 8   Period Weeks   Status New     OT LONG TERM GOAL #2   Title Pt will demonstrate improved fine motor coordination as evidenced by decreasing RUE 9 hole peg test to 35 secs or less.   Baseline RUE 39.81 secs, LUE 30.72 secs   Time 8   Period Weeks   Status New     OT LONG TERM GOAL #3   Title Pt will demonstrate improved ability to fasten buttons as evidenced by decreasing 3 button/ unbutton test to 45 secs or less.   Time 8   Period Weeks   Status New     OT LONG TERM GOAL #4   Title Pt will verbalize understanding of ways to keep thinking skills sharp and ways to compensate for memory changes PRN..   Time 8   Period Weeks   Status New               Plan - 02/09/16 1734    Clinical Impression Statement Pt is a 60 y.o male with a history of DM, dignosed with PD 01/04/16 by Dr. [name]. Pt works as a Electrical engineer at [employer]. Pt presents with decreased coordination,pain,  bradykinesia, mild rigidity,  and decreased balance. Pt can benefit from skilled occupational therapy to maximize independence with ADLs/IADLS and to maintain quality of life.   Rehab Potential Good   OT Frequency 2x / week  plus eval   OT Duration 8 weeks   OT  Treatment/Interventions Self-care/ADL training;Moist Heat;Fluidtherapy;DME and/or AE instruction;Splinting;Patient/family education;Balance training;Therapeutic exercises;Gait Training;Contrast Bath;Ultrasound;Therapeutic exercise;Therapeutic activities;Cognitive remediation/compensation;Passive range of motion;Functional Mobility Training;Neuromuscular education;Cryotherapy;Electrical Stimulation;Parrafin;Energy conservation;Manual Therapy;Visual/perceptual remediation/compensation   Plan test standing functional reach, initate PWR! seated   Consulted and Agree with Plan of Care Patient      Patient will benefit from skilled therapeutic intervention in order to improve the following deficits and impairments:  Abnormal gait, Decreased range of motion, Difficulty walking, Impaired flexibility, Decreased safety awareness, Decreased endurance, Decreased activity tolerance, Decreased knowledge of precautions, Impaired tone, Impaired UE functional use, Pain, Decreased balance, Decreased cognition, Decreased mobility, Decreased strength  Visit Diagnosis: Other lack of coordination  Other symptoms and signs involving the musculoskeletal system  Other symptoms and signs involving the nervous system  Unsteadiness on feet  Other abnormalities of gait and mobility    Problem List Patient Active Problem List   Diagnosis Date Noted  . Parkinson disease ([place]) 01/04/2016  . Seasonal and perennial allergic rhinitis 06/21/2014  . Asthma, mild intermittent, well-controlled 06/21/2014  . Pseudomonas aeruginosa colonization 10/08/2012  . Hemorrhage of rectum and anus 07/01/2012  . CAD (coronary artery disease)  11/15/2011  . Hypertension 11/15/2011  . Hyperlipidemia 11/15/2011  . Obesity 11/15/2011  . Ureteral calculus, right 06/12/2011    RINE,KATHRYN 02/10/2016, 12:38 PM Theone Murdoch, OTR/L [phone] Phone: [phone] 12:44 PM 10/05/17[facility] [address] [place], [place], [address] Phone: [phone]   Fax:  [phone]  Name: JAKIM MAZZOLA MRN: [id number] Date of Birth: 1956/03/13

## 2016-02-16 MED FILL — ROSUVASTATIN CALCIUM 10 MG: 10 | 90 days supply | Qty: 90 | Fill #2

## 2016-02-16 NOTE — Telephone Encounter (Signed)
Called pt and left VM mssg to return call re: FMLA paperwork.

## 2016-02-17 NOTE — Telephone Encounter (Signed)
Called pt back but unable to reach him. Left another mssg to return call. Need to know how may days per month are needed for flare-ups or appts on FMLA.

## 2016-02-17 NOTE — Telephone Encounter (Signed)
Patient returned Jennifer's call, please call [phone].

## 2016-02-18 ENCOUNTER — Ambulatory Visit: Payer: 59 | Admitting: Occupational Therapy

## 2016-02-18 ENCOUNTER — Ambulatory Visit: Payer: 59

## 2016-02-18 ENCOUNTER — Ambulatory Visit: Payer: 59 | Admitting: Physical Therapy

## 2016-02-18 DIAGNOSIS — R29818 Other symptoms and signs involving the nervous system: Secondary | ICD-10-CM

## 2016-02-18 DIAGNOSIS — R2681 Unsteadiness on feet: Secondary | ICD-10-CM

## 2016-02-18 DIAGNOSIS — R471 Dysarthria and anarthria: Secondary | ICD-10-CM

## 2016-02-18 DIAGNOSIS — R293 Abnormal posture: Secondary | ICD-10-CM

## 2016-02-18 DIAGNOSIS — R278 Other lack of coordination: Secondary | ICD-10-CM | POA: Diagnosis not present

## 2016-02-18 DIAGNOSIS — R2689 Other abnormalities of gait and mobility: Secondary | ICD-10-CM | POA: Diagnosis not present

## 2016-02-18 DIAGNOSIS — R29898 Other symptoms and signs involving the musculoskeletal system: Secondary | ICD-10-CM | POA: Diagnosis not present

## 2016-02-18 NOTE — Therapy (Signed)
[facility] [address] [place], [place], [address] Phone: [phone]   Fax:  [phone]  Physical Therapy Evaluation  Patient Details  Name: KYREESE ANSELL MRN: [id number] Date of Birth: 23-Oct-1955 Referring Provider: [name]  Encounter Date: 02/18/2016      PT End of Session - 02/18/16 1234    Visit Number 1   Number of Visits 13   Date for PT Re-Evaluation 04/18/16   Authorization Type UMR   PT Start Time 1020   PT Stop Time 1102   PT Time Calculation (min) 42 min   Activity Tolerance Patient tolerated treatment well   Behavior During Therapy [facility] for tasks assessed/performed      Past Medical History:  Diagnosis Date  . Adult BMI 50.0-59.9 kg/sq m    52.77  . Anal fissure   . Anxiety   . Diabetes mellitus without complication (HCC)    borderline  . GERD (gastroesophageal reflux disease)    30 years ago, maybe from peptic ulcer  . H/O: gout    STABLE  . Hepatic steatosis 10/01/11  . History of kidney stones   . Hyperlipidemia   . Hypertension   . Kidney tumor    right upper kidney tumor, monitoring  . OSA on CPAP   . Parkinson disease ([place]) 01/04/2016  . Right ureteral stone   . Vitamin D deficiency   . Weakness     Past Surgical History:  Procedure Laterality Date  . ANAL FISSURE REPAIR  10-12-2000  . COLONOSCOPY N/A 07/01/2012   Procedure: COLONOSCOPY;  Surgeon: Lafayette Dragon, MD;  Location: [facility];  Service: Endoscopy;  Laterality: N/A;  . CYSTOSCOPY WITH RETROGRADE PYELOGRAM, URETEROSCOPY AND STENT PLACEMENT Bilateral 02/26/2013   Procedure: CYSTOSCOPY WITH RETROGRADE PYELOGRAM, URETEROSCOPY AND STENT PLACEMENT;  Surgeon: Alexis Frock, MD;  Location: [facility];  Service: Urology;  Laterality: Bilateral;  . HOLMIUM LASER APPLICATION Bilateral [id number]   Procedure: HOLMIUM LASER APPLICATION;  Surgeon: Alexis Frock, MD;  Location: [facility];  Service: Urology;  Laterality: Bilateral;   . kidney stone removal     06/2011-stent placement  . LEFT MEDIAL FEMORAL CONDYLE DEBRIDEMENT & DRILLING/ REMOVAL LOOSE BODY  09-15-2003  . NASAL SEPTUM SURGERY  1985  . STONE EXTRACTION WITH BASKET  06/12/2011   Procedure: STONE EXTRACTION WITH BASKET;  Surgeon: Claybon Jabs, MD;  Location: [facility];  Service: Urology;  Laterality: Right;    There were no vitals filed for this visit.       Subjective Assessment - 02/18/16 1022    Subjective Pt is a 60 year old male who presents to OP PT with recent diagnosis of Parkinson's disease (12/2015).  He notes difficulty with coordination, writing and fine motor tasks.  He notices decreased arm swing on R with gait; no falls.   Patient Stated Goals Pt's goal for physical therapy to is regain normal use of R arm and keep from falling.   Currently in Pain? No/denies            [facility] PT Assessment - 02/18/16 1027      Assessment   Medical Diagnosis Parkinson's disease   Referring Provider [name]   Onset Date/Surgical Date --  August 2017     Precautions   Precautions Fall     Balance Screen   Has the patient fallen in the past 6 months No   Has the patient had a decrease in activity level because of a fear  of falling?  No   Is the patient reluctant to leave their home because of a fear of falling?  No     Home Social worker Private residence   Living Arrangements Spouse/significant other   Available Help at Discharge Family   Type of [place] to enter   Entrance Stairs-Number of Steps 1   Entrance Stairs-Rails None   Home Layout Two level;Able to live on main level with bedroom/bathroom     Prior Function   Level of Independence Independent   Vocation Full time employment  phlebotomist   Leisure Plays golf, ride bikes, Psychologist, occupational for [employer]   Posture/Postural Control Postural limitations   Postural Limitations Rounded  Shoulders;Forward head     ROM / Strength   AROM / PROM / Strength Strength     Strength   Overall Strength Comments Strength grossly tested at least 4 to 5/5     Transfers   Transfers Sit to Stand;Stand to Sit   Sit to Stand 6: Modified independent (Device/Increase time);Without upper extremity assist;From chair/3-in-1   Five time sit to stand comments  13.92   Stand to Sit 6: Modified independent (Device/Increase time);Without upper extremity assist;To chair/3-in-1   Comments Pt reports difficulty with low surface and soft surface transfers.     Ambulation/Gait   Ambulation/Gait Yes   Ambulation/Gait Assistance 7: Independent   Ambulation Distance (Feet) 250 Feet   Assistive device None   Gait Pattern Step-through pattern;Decreased arm swing - right;Decreased step length - right;Decreased stance time - right;Decreased trunk rotation;Poor foot clearance - right;Poor foot clearance - left   Ambulation Surface Level;Indoor   Gait velocity 10.16 sec - 3.23 ft/sec   Stairs Yes   Stair Management Technique One rail Left;Alternating pattern;Step to pattern  Reports step-to pattern descending at times   Number of Stairs 4   Height of Stairs 6     Standardized Balance Assessment   Standardized Balance Assessment Timed Up and Go Test;Four Square Step Test   Four Square Step Test Trial One;Trial Two     Timed Up and Go Test   Normal TUG (seconds) 13   Manual TUG (seconds) 13.28   Cognitive TUG (seconds) 14.96  1 episode of foot catching   TUG Comments Scores >10% difference in TUG and TUG cognitive indicate difficulty with dual tasking     Four Square Step Test    Trial One  15.10   Trial Two 13.4     High Level Balance   High Level Balance Comments Posterior push and release test, anterior push and release test:  pt takes one step and independently regains balance.     Functional Gait  Assessment   Gait assessed  Yes   Gait Level Surface Walks 20 ft in less than 7 sec but  greater than 5.5 sec, uses assistive device, slower speed, mild gait deviations, or deviates 6-10 in outside of the 12 in walkway width.  6.67   Change in Gait Speed Able to smoothly change walking speed without loss of balance or gait deviation. Deviate no more than 6 in outside of the 12 in walkway width.   Gait with Horizontal Head Turns Performs head turns smoothly with slight change in gait velocity (eg, minor disruption to smooth gait path), deviates 6-10 in outside 12 in walkway width, or uses an assistive device.   Gait with Vertical Head Turns Performs task  with slight change in gait velocity (eg, minor disruption to smooth gait path), deviates 6 - 10 in outside 12 in walkway width or uses assistive device   Gait and Pivot Turn Pivot turns safely within 3 sec and stops quickly with no loss of balance.   Step Over Obstacle Is able to step over one shoe box (4.5 in total height) but must slow down and adjust steps to clear box safely. May require verbal cueing.   Gait with Narrow Base of Support Is able to ambulate for 10 steps heel to toe with no staggering.   Gait with Eyes Closed Walks 20 ft, uses assistive device, slower speed, mild gait deviations, deviates 6-10 in outside 12 in walkway width. Ambulates 20 ft in less than 9 sec but greater than 7 sec.  8.85 sec   Ambulating Backwards Walks 20 ft, uses assistive device, slower speed, mild gait deviations, deviates 6-10 in outside 12 in walkway width.  11.82 sec   Steps Alternating feet, must use rail.   Total Score 22   FGA comment: Scores <22/30 indicate increased fall risk.     RUE Tone   RUE Tone Mild  increased rigidity noted RLE                           PT Education - 02/18/16 1233    Education provided Yes   Education Details Purpose of PWR! Moves exercises incorporating large amplitude, high intensity, whole body moves; provided info on [employer] Parkinson's community group   Person(s) Educated Patient    Methods Explanation;Handout   Comprehension Verbalized understanding          PT Short Term Goals - 02/18/16 1243      PT SHORT TERM GOAL #1   Title Pt will be independent with HEP for improved balance, transfers, gait.  TARGET 03/19/16   Time 4   Period Weeks   Status New     PT SHORT TERM GOAL #2   Title Pt will verbalize understanding of local community Parkinson's related resources.   Time 4   Period Weeks   Status New     PT SHORT TERM GOAL #3   Title Pt will improve 5x sit<>stand to less than or equal to 11.5 seconds for improved efficiency and safety with gait.   Time 4   Period Weeks   Status New           PT Long Term Goals - 02/18/16 1245      PT LONG TERM GOAL #1   Title Pt will verbalize understanding of fall prevention in home environment.  TARGET 04/18/16   Time 8   Period Weeks   Status New     PT LONG TERM GOAL #2   Title Pt will perform at least 8 of 10 reps of sit<>stand transfers from lower than 18" surfaces, independently, for improved ease of transfers.   Time 8   Period Weeks   Status New     PT LONG TERM GOAL #3   Title Pt will improve TUG and TUG cognitive to <10% difference in times, for improved dual tasking with gait.   Time 8   Period Weeks   Status New     PT LONG TERM GOAL #4   Title Pt will improve 4-square step test to less than or equal to 11 seconds with no LOB, for improved balance with change of directions.   Time 8  Period Weeks   Status New     PT LONG TERM GOAL #5   Title Pt will verbalize plans for continued community fitness upon D/C from PT.   Time 8   Period Weeks   Status New               Plan - 02/18/16 1235    Clinical Impression Statement Pt is a 60 year old male who was diagnosed with Parkinson's disease approximately 6 weeks ago.  Pt presents to OP PT with decreased timing and coordination of gait, decreased transfer efficiency, decreased ability for dual tasking for gait, decreased  balance, rigidity, decreased RUE coordination.  Pt continues to work full time, and pt is not participating in HEP at this time.  Pt will continue to benefit from further skilled PT to address the above stated deficits for improved functional mobility, improved participation in work and leisure activities, decreased fall risk.     Rehab Potential Good   PT Frequency 2x / week  1x/wk for 1 wk, then   PT Duration 8 weeks   PT Treatment/Interventions ADLs/Self Care Home Management;Functional mobility training;Gait training;Therapeutic activities;Therapeutic exercise;Balance training;Neuromuscular re-education;Patient/family education   PT Next Visit Plan Initiate standing PWR! Moves; low surface transfers, gait training-working on arm swing, timing and coordination of gait   Consulted and Agree with Plan of Care Patient      Patient will benefit from skilled therapeutic intervention in order to improve the following deficits and impairments:  Abnormal gait, Decreased balance, Decreased mobility, Difficulty walking, Impaired flexibility, Postural dysfunction  Visit Diagnosis: Other abnormalities of gait and mobility  Other symptoms and signs involving the nervous system  Unsteadiness on feet  Abnormal posture     Problem List Patient Active Problem List   Diagnosis Date Noted  . Parkinson disease ([place]) 01/04/2016  . Seasonal and perennial allergic rhinitis 06/21/2014  . Asthma, mild intermittent, well-controlled 06/21/2014  . Pseudomonas aeruginosa colonization 10/08/2012  . Hemorrhage of rectum and anus 07/01/2012  . CAD (coronary artery disease) 11/15/2011  . Hypertension 11/15/2011  . Hyperlipidemia 11/15/2011  . Obesity 11/15/2011  . Ureteral calculus, right 06/12/2011    Ranetta Armacost W. 02/18/2016, 12:57 PM  [facility] PT [facility] [address] [place], [place], [address] Phone: [phone]   Fax:   [phone]  Name: TASMAN EDELMAN MRN: [id number] Date of Birth: [birth year]

## 2016-02-18 NOTE — Patient Instructions (Signed)
Coordination Exercises  Perform the following exercises for 20 minutes 1 times per day. Perform with both hand(s). Perform using big movements.   Flipping Cards: Place deck of cards on the table. Flip cards over by opening your hand big to grasp and then turn your palm up big.  Deal cards: Hold 1/2 or whole deck in your hand. Use thumb to push card off top of deck with one big push.  Rotate ball with fingertips: Pick up with fingers/thumb and move as much as you can with each turn/movement (clockwise and counter-clockwise).  Toss ball from one hand to the other: Toss big/high.  Toss ball in the air and catch with the same hand: Toss big/high.  Pick up coins and place in coin bank or container: Pick up with big, intentional movements. Do not drag coin to the edge.  Pick up coins and stack one at a time: Pick up with big, intentional movements. Do not drag coin to the edge. (5-10 in a stack)  Pick up 5-10 coins one at a time and hold in palm. Then, move coins from palm to fingertips one at time and place in coin bank/container.  Pick up 5-10 coins one at a time and hold in palm. Then, move coins from palm to fingertips one at a time to stack.  Practice writing: Slow down, write big, and focus on forming each letter.  Perform "Flicks"/hand stretches (PWR! Hands): Close hands then flick out your fingers with focus on opening hands, pulling wrists back, and extending elbows like you are pushing. 

## 2016-02-18 NOTE — Patient Instructions (Signed)
Complete 10 loud "hey" twice each day. Feel the effort from your belly/bottom of your chair, and leave your throat loose.

## 2016-02-18 NOTE — Therapy (Signed)
[facility] [address] [place], [place], [address] Phone: [phone]   Fax:  [phone]  Speech Language Pathology Evaluation  Patient Details  Name: Todd Rice MRN: [id number] Date of Birth: Jul 23, 1955 Referring Provider: Neldon Newport MD  Encounter Date: 02/18/2016      End of Session - 02/18/16 1639    Visit Number 1   Number of Visits 16   Date for SLP Re-Evaluation 05/05/16   SLP Start Time 1150   SLP Stop Time  1233   SLP Time Calculation (min) 43 min   Activity Tolerance Patient tolerated treatment well      Past Medical History:  Diagnosis Date  . Adult BMI 50.0-59.9 kg/sq m    52.77  . Anal fissure   . Anxiety   . Diabetes mellitus without complication (HCC)    borderline  . GERD (gastroesophageal reflux disease)    30 years ago, maybe from peptic ulcer  . H/O: gout    STABLE  . Hepatic steatosis 10/01/11  . History of kidney stones   . Hyperlipidemia   . Hypertension   . Kidney tumor    right upper kidney tumor, monitoring  . OSA on CPAP   . Parkinson disease ([place]) 01/04/2016  . Right ureteral stone   . Vitamin D deficiency   . Weakness     Past Surgical History:  Procedure Laterality Date  . ANAL FISSURE REPAIR  10-12-2000  . COLONOSCOPY N/A 07/01/2012   Procedure: COLONOSCOPY;  Surgeon: Lafayette Dragon, MD;  [facility];  Service: Endoscopy;  Laterality: N/A;  . CYSTOSCOPY WITH RETROGRADE PYELOGRAM, URETEROSCOPY AND STENT PLACEMENT Bilateral 02/26/2013   Procedure: CYSTOSCOPY WITH RETROGRADE PYELOGRAM, URETEROSCOPY AND STENT PLACEMENT;  Surgeon: Alexis Frock, MD;  [facility];  Service: Urology;  Laterality: Bilateral;  . HOLMIUM LASER APPLICATION Bilateral [id number]   Procedure: HOLMIUM LASER APPLICATION;  Surgeon: Alexis Frock, MD;  Location: [facility];  Service: Urology;  Laterality: Bilateral;  . kidney stone removal     06/2011-stent placement  . LEFT  MEDIAL FEMORAL CONDYLE DEBRIDEMENT & DRILLING/ REMOVAL LOOSE BODY  09-15-2003  . NASAL SEPTUM SURGERY  1985  . STONE EXTRACTION WITH BASKET  06/12/2011   Procedure: STONE EXTRACTION WITH BASKET;  Surgeon: Claybon Jabs, MD;  Location: [facility];  Service: Urology;  Laterality: Right;    There were no vitals filed for this visit.      Subjective Assessment - 02/18/16 1157    Subjective "People at work or my wife or mother tell me "you're mumbling."   Currently in Pain? No/denies            SLP Evaluation [facility] - 02/18/16 1157      SLP Visit Information   SLP Received On 02/18/16   Referring Provider Neldon Newport. MD   Onset Date Diagnosed 01-04-16   Medical Diagnosis Parkinson's disease     General Information   HPI Pt with s/s Parkinson's 5-6 months ago, diagnosed August 2017.      Prior Functional Status   Cognitive/Linguistic Baseline Within functional limits     Cognition   Overall Cognitive Status Within Functional Limits for tasks assessed     Auditory Comprehension   Overall Auditory Comprehension Appears within functional limits for tasks assessed     Verbal Expression   Overall Verbal Expression Appears within functional limits for tasks assessed     Oral Motor/Sensory Function   Overall Oral Motor/Sensory Function  Impaired   Lingual Strength Reduced Left  slight     Motor Speech   Overall Motor Speech Impaired   Phonation Impaired   Volume Soft  average 67dB; loud /a/=95dB avg;incr'd effort 72dB     Five minutes of conversational speech was reduced today, at average 67dB (WNL= average 70-72dB) with range of 62-70dB, when a sound level meter was placed 30 cm away from pt's mouth. Overall intelligibility for this listener in a quiet environment was approx 100%. Production of loud /a/ averaged 95dB (range of 89 to 96dB) and occasional verbal cues needed for maintaining loudness.   Pt rated effort level at 9/10 for production of loud /a/  (10=maximal effort). In 2 minutes conversation tasks, pt was asked to use the same amount of effort as with loud /a/. Loudness average with this increased effort was 72dB (range of 68 to 74) with rare min verbal cues for loudness. Pt would benefit from skilled ST in order to improve speech intelligibility and pt's QOL.                     SLP Education - 02/18/16 1638    Education provided Yes   Education Details loud "hey", loud /a/ but SLP could not shape during eval, practice with loud "hey" (HEP)   Person(s) Educated Patient   Methods Explanation;Demonstration;Verbal cues   Comprehension Verbalized understanding;Returned demonstration;Verbal cues required;Need further instruction          SLP Short Term Goals - 02/18/16 1642      SLP SHORT TERM GOAL #1   Title pt will maintain loud /a/ average 94dB with appropriate voicing (<15% strained/strangled voice) over 4 sessions   Time 4   Period Weeks   Status New     SLP SHORT TERM GOAL #2   Title pt will produce 19/20 sentences with average 70dB over two sessions   Time 4   Period Weeks   Status New     SLP SHORT TERM GOAL #3   Title pt will demo simple conversation of 8 minutes with average 70dB over three sessions   Time 4   Period Weeks   Status New     SLP SHORT TERM GOAL #4   Title pt will exhibit loud "Hey!" with average 95dB over 3 sessions   Time 4   Period Weeks   Status New          SLP Long Term Goals - 02/18/16 1643      SLP LONG TERM GOAL #1   Title pt will maintain loud /a/ at 95dB with proper voicing over 5 sessions   Time 8   Period Weeks   Status New     SLP LONG TERM GOAL #2   Title pt will demo 10 minutes mod complex conversastion with average 70dB over three sessions   Time 8   Period Weeks   Status New          Plan - 02/18/16 1640    Clinical Impression Statement Pt presents today with speech volume lower than WNL. When SLP encouraged pt to produce more effort/loudness  in simple 2 minute conversation pt incr'd volume to 72dB. Pt reports people have increasingly asked him to improve his loudness over the last 6-9 months. He would beneift from skilled ST to incr loudness and thus incr QOL.   Speech Therapy Frequency 2x / week   Duration --  8 weeks or 16 visits total   Treatment/Interventions Compensatory  strategies;Patient/family education;Functional tasks;Cueing hierarchy;SLP instruction and feedback;Internal/external aids  HEP   Potential to Achieve Goals Good      Patient will benefit from skilled therapeutic intervention in order to improve the following deficits and impairments:   Dysarthria and anarthria    Problem List Patient Active Problem List   Diagnosis Date Noted  . Parkinson disease ([place]) 01/04/2016  . Seasonal and perennial allergic rhinitis 06/21/2014  . Asthma, mild intermittent, well-controlled 06/21/2014  . Pseudomonas aeruginosa colonization 10/08/2012  . Hemorrhage of rectum and anus 07/01/2012  . CAD (coronary artery disease) 11/15/2011  . Hypertension 11/15/2011  . Hyperlipidemia 11/15/2011  . Obesity 11/15/2011  . Ureteral calculus, right 06/12/2011    [facility] ,[place], CCC-SLP  02/18/2016, 4:48 PM  [facility] [address] [place], [place], [address] Phone: [phone]   Fax:  [phone]  Name: Todd Rice MRN: [id number] Date of Birth: 01-18-1956

## 2016-02-18 NOTE — Therapy (Signed)
[facility] [address] [place], [place], [address] Phone: [phone]   Fax:  [phone]  Occupational Therapy Treatment  Patient Details  Name: Todd Rice MRN: [id number] Date of Birth: [birth year] Referring Provider: Dr. [name]  Encounter Date: 02/18/2016      OT End of Session - 02/18/16 1306    Visit Number 2   Number of Visits 17   Date for OT Re-Evaluation 04/08/16   Authorization Type cone UMR   OT Start Time 0850   OT Stop Time 0930   OT Time Calculation (min) 40 min   Activity Tolerance Patient tolerated treatment well   Behavior During Therapy [facility] for tasks assessed/performed      Past Medical History:  Diagnosis Date  . Adult BMI 50.0-59.9 kg/sq m    52.77  . Anal fissure   . Anxiety   . Diabetes mellitus without complication (HCC)    borderline  . GERD (gastroesophageal reflux disease)    30 years ago, maybe from peptic ulcer  . H/O: gout    STABLE  . Hepatic steatosis 10/01/11  . History of kidney stones   . Hyperlipidemia   . Hypertension   . Kidney tumor    right upper kidney tumor, monitoring  . OSA on CPAP   . Parkinson disease ([place]) 01/04/2016  . Right ureteral stone   . Vitamin D deficiency   . Weakness     Past Surgical History:  Procedure Laterality Date  . ANAL FISSURE REPAIR  10-12-2000  . COLONOSCOPY N/A 07/01/2012   Procedure: COLONOSCOPY;  Surgeon: Lafayette Dragon, MD;  [facility];  Service: Endoscopy;  Laterality: N/A;  . CYSTOSCOPY WITH RETROGRADE PYELOGRAM, URETEROSCOPY AND STENT PLACEMENT Bilateral 02/26/2013   Procedure: CYSTOSCOPY WITH RETROGRADE PYELOGRAM, URETEROSCOPY AND STENT PLACEMENT;  Surgeon: Alexis Frock, MD;  [facility];  Service: Urology;  Laterality: Bilateral;  . HOLMIUM LASER APPLICATION Bilateral [id number]   Procedure: HOLMIUM LASER APPLICATION;  Surgeon: Alexis Frock, MD;  [facility];  Service: Urology;  Laterality:  Bilateral;  . kidney stone removal     06/2011-stent placement  . LEFT MEDIAL FEMORAL CONDYLE DEBRIDEMENT & DRILLING/ REMOVAL LOOSE BODY  09-15-2003  . NASAL SEPTUM SURGERY  1985  . STONE EXTRACTION WITH BASKET  06/12/2011   Procedure: STONE EXTRACTION WITH BASKET;  Surgeon: Claybon Jabs, MD;  Location[facility];  Service: Urology;  Laterality: Right;    There were no vitals filed for this visit.      Subjective Assessment - 02/18/16 1303    Subjective  Pt reports he is trying to keeping his fingers in extension at rest   Patient Stated Goals improve function in right hand   Currently in Pain? No/denies                         PWR [facility]) - 02/18/16 1307    PWR! exercises Moves in sitting   PWR! Up x10   PWR! Rock [employer]! Twist x20   PWR! Step x10   Comments min-mod v.c. and demonstration             OT Education - 02/18/16 1303    Education provided Yes   Education Details PWR! seated, coordination HEP   Person(s) Educated Patient   Methods Explanation;Demonstration;Verbal cues;Handout   Comprehension Verbalized understanding;Returned demonstration;Verbal cues required          OT Short  Term Goals - 02/18/16 1305      OT SHORT TERM GOAL #1   Title I with PD  specific HEP.   Time 4   Period Weeks   Status New     OT SHORT TERM GOAL #2   Title I with adapted strategies for ADLs/ IADLs.   Time 4   Period Weeks   Status New     OT SHORT TERM GOAL #3   Title Pt will demonstrate ability to write a sentence with 95% legibility, good letter size with only minimal decrease in letter size.   Time 4   Period Weeks   Status New     OT SHORT TERM GOAL #4   Title Pt will demonstrate improved RUE functional use as evidenced by increasing box/ blocks score to 43 blocks or greater.   Baseline RUE 39 blocks, LUE 48 blocks   Time 4   Period Weeks   Status New     OT SHORT TERM GOAL #5   Title Pt will increase bilateral  standing functional reach to 10 inches or greater bilaterally for decreased fall risk   Baseline RUE 8.5, LUE 9 inches   Time 4   Period Weeks   Status New           OT Long Term Goals - 02/10/16 1242      OT LONG TERM GOAL #1   Title Pt will verbalize understanding of community resources and ways to prevent future PD related  complications.   Time 8   Period Weeks   Status New     OT LONG TERM GOAL #2   Title Pt will demonstrate improved fine motor coordination as evidenced by decreasing RUE 9 hole peg test to 35 secs or less.   Baseline RUE 39.81 secs, LUE 30.72 secs   Time 8   Period Weeks   Status New     OT LONG TERM GOAL #3   Title Pt will demonstrate improved ability to fasten buttons as evidenced by decreasing 3 button/ unbutton test to 45 secs or less.   Time 8   Period Weeks   Status New     OT LONG TERM GOAL #4   Title Pt will verbalize understanding of ways to keep thinking skills sharp and ways to compensate for memory changes PRN..   Time 8   Period Weeks   Status New     OT LONG TERM GOAL #5   Title Pt will report UE pain no greater than 3/10 for ADLs/IADLs.   Time 8   Period Weeks   Status New               Plan - 02/18/16 1304    Clinical Impression Statement Pt is progressing towards goals. He demonstrates understanding of PWR! moves seated and coordination HEP.   Rehab Potential Good   OT Frequency 2x / week   OT Duration 8 weeks   OT Treatment/Interventions Self-care/ADL training;Moist Heat;Fluidtherapy;DME and/or AE instruction;Splinting;Patient/family education;Balance training;Therapeutic exercises;Gait Training;Contrast Bath;Ultrasound;Therapeutic exercise;Therapeutic activities;Cognitive remediation/compensation;Passive range of motion;Functional Mobility Training;Neuromuscular education;Cryotherapy;Electrical Stimulation;Parrafin;Energy conservation;Manual Therapy;Visual/perceptual remediation/compensation   Plan reinforce PWR!  seated, progress to modified quadraped or supine PWR!   Consulted and Agree with Plan of Care Patient      Patient will benefit from skilled therapeutic intervention in order to improve the following deficits and impairments:  Abnormal gait, Decreased range of motion, Difficulty walking, Impaired flexibility, Decreased safety awareness, Decreased endurance, Decreased activity tolerance, Decreased knowledge  of precautions, Impaired tone, Impaired UE functional use, Pain, Decreased balance, Decreased cognition, Decreased mobility, Decreased strength  Visit Diagnosis: Other symptoms and signs involving the nervous system  Unsteadiness on feet  Abnormal posture  Other lack of coordination  Other symptoms and signs involving the musculoskeletal system    Problem List Patient Active Problem List   Diagnosis Date Noted  . Parkinson disease ([place]) 01/04/2016  . Seasonal and perennial allergic rhinitis 06/21/2014  . Asthma, mild intermittent, well-controlled 06/21/2014  . Pseudomonas aeruginosa colonization 10/08/2012  . Hemorrhage of rectum and anus 07/01/2012  . CAD (coronary artery disease) 11/15/2011  . Hypertension 11/15/2011  . Hyperlipidemia 11/15/2011  . Obesity 11/15/2011  . Ureteral calculus, right 06/12/2011    RINE,KATHRYN 02/18/2016, 1:08 PM  [facility] [address] [place], [place], [address] Phone: [phone]   Fax:  [phone]  Name: LOCHIE PEVELER MRN: [id number] Date of Birth: Jul 01, 1955

## 2016-02-22 ENCOUNTER — Ambulatory Visit: Payer: 59 | Admitting: Physical Therapy

## 2016-02-22 ENCOUNTER — Ambulatory Visit: Payer: 59 | Admitting: Occupational Therapy

## 2016-02-24 ENCOUNTER — Ambulatory Visit (INDEPENDENT_AMBULATORY_CARE_PROVIDER_SITE_OTHER): Payer: 59 | Admitting: Neurology

## 2016-02-24 DIAGNOSIS — Z9989 Dependence on other enabling machines and devices: Principal | ICD-10-CM

## 2016-02-24 DIAGNOSIS — G4733 Obstructive sleep apnea (adult) (pediatric): Secondary | ICD-10-CM

## 2016-02-24 DIAGNOSIS — R490 Dysphonia: Secondary | ICD-10-CM

## 2016-02-24 DIAGNOSIS — G20 Parkinson's disease: Secondary | ICD-10-CM

## 2016-02-24 DIAGNOSIS — F518 Other sleep disorders not due to a substance or known physiological condition: Secondary | ICD-10-CM

## 2016-02-24 DIAGNOSIS — R1310 Dysphagia, unspecified: Secondary | ICD-10-CM

## 2016-02-25 ENCOUNTER — Encounter: Payer: 59 | Admitting: Occupational Therapy

## 2016-02-29 ENCOUNTER — Ambulatory Visit: Payer: 59 | Admitting: Physical Therapy

## 2016-02-29 ENCOUNTER — Ambulatory Visit: Payer: 59 | Admitting: Occupational Therapy

## 2016-02-29 DIAGNOSIS — R278 Other lack of coordination: Secondary | ICD-10-CM

## 2016-02-29 DIAGNOSIS — R29818 Other symptoms and signs involving the nervous system: Secondary | ICD-10-CM

## 2016-02-29 DIAGNOSIS — R29898 Other symptoms and signs involving the musculoskeletal system: Secondary | ICD-10-CM

## 2016-02-29 DIAGNOSIS — R2689 Other abnormalities of gait and mobility: Secondary | ICD-10-CM | POA: Diagnosis not present

## 2016-02-29 DIAGNOSIS — R293 Abnormal posture: Secondary | ICD-10-CM | POA: Diagnosis not present

## 2016-02-29 DIAGNOSIS — R471 Dysarthria and anarthria: Secondary | ICD-10-CM | POA: Diagnosis not present

## 2016-02-29 DIAGNOSIS — R2681 Unsteadiness on feet: Secondary | ICD-10-CM | POA: Diagnosis not present

## 2016-02-29 NOTE — Addendum Note (Signed)
Addended by: Frazier Butt on: 02/29/2016 10:23 PM   Modules accepted: Orders

## 2016-02-29 NOTE — Patient Instructions (Signed)
Sit to Stand Transfers:  1. Scoot out to the edge of the chair 2. Place your feet flat on the floor, shoulder width apart.  Make sure your feet are tucked just under your knees. 3. Lean forward (nose over toes) with momentum, and stand up tall with your best posture.  If you need to use your arms, use them as a quick boost up to stand. 4. If you are in a low or soft chair, you can lean back and then forward up to stand, in order to get more momentum. 5. Once you are standing, make sure you are looking ahead and standing tall.  To sit down:  1. Back up until you feel the chair behind your legs. 2. Bend at you hips, reaching  Back for you chair, if needed, then slowly squat to sit down on your chair.   REPEAT SIT TO STAND FROM 18-20 INCH HEIGHT, 10 REPS, 1-2 TIMES PER DAY, AS ABOVE, TO REINFORCE BEST TRANSFER TECHNIQUE   WALKING: WHEN YOU ARE WALKING, MAKE SURE TO STAND AS TALL AS POSSIBLE, RELAX YOUR SHOULDERS, AND ALLOW YOUR ARMS TO SWING (OPPOSITE ARM AND OPPOSITE LEG) WITH TAKING LONG STRIDES

## 2016-02-29 NOTE — Therapy (Signed)
[facility] [address] [place], [place], [address] Phone: [phone]   Fax:  [phone]  Physical Therapy Treatment  Patient Details  Name: Todd Rice MRN: [id number] Date of Birth: 03/03/1956 Referring Provider: [name]  Encounter Date: 02/29/2016      PT End of Session - 02/29/16 2211    Visit Number 2   Number of Visits 13   Date for PT Re-Evaluation 04/18/16   Authorization Type UMR   PT Start Time 1016   PT Stop Time 1057   PT Time Calculation (min) 41 min   Activity Tolerance Patient tolerated treatment well   Behavior During Therapy [facility] for tasks assessed/performed      Past Medical History:  Diagnosis Date  . Adult BMI 50.0-59.9 kg/sq m    52.77  . Anal fissure   . Anxiety   . Diabetes mellitus without complication (HCC)    borderline  . GERD (gastroesophageal reflux disease)    30 years ago, maybe from peptic ulcer  . H/O: gout    STABLE  . Hepatic steatosis 10/01/11  . History of kidney stones   . Hyperlipidemia   . Hypertension   . Kidney tumor    right upper kidney tumor, monitoring  . OSA on CPAP   . Parkinson disease ([place]) 01/04/2016  . Right ureteral stone   . Vitamin D deficiency   . Weakness     Past Surgical History:  Procedure Laterality Date  . ANAL FISSURE REPAIR  10-12-2000  . COLONOSCOPY N/A 07/01/2012   Procedure: COLONOSCOPY;  Surgeon: Lafayette Dragon, MD;  [facility];  Service: Endoscopy;  Laterality: N/A;  . CYSTOSCOPY WITH RETROGRADE PYELOGRAM, URETEROSCOPY AND STENT PLACEMENT Bilateral 02/26/2013   Procedure: CYSTOSCOPY WITH RETROGRADE PYELOGRAM, URETEROSCOPY AND STENT PLACEMENT;  Surgeon: Alexis Frock, MD;  [facility];  Service: Urology;  Laterality: Bilateral;  . HOLMIUM LASER APPLICATION Bilateral [id number]   Procedure: HOLMIUM LASER APPLICATION;  Surgeon: Alexis Frock, MD;  [facility];  Service: Urology;  Laterality: Bilateral;   . kidney stone removal     06/2011-stent placement  . LEFT MEDIAL FEMORAL CONDYLE DEBRIDEMENT & DRILLING/ REMOVAL LOOSE BODY  09-15-2003  . NASAL SEPTUM SURGERY  1985  . STONE EXTRACTION WITH BASKET  06/12/2011   Procedure: STONE EXTRACTION WITH BASKET;  Surgeon: Claybon Jabs, MD;  Location[facility];  Service: Urology;  Laterality: Right;    There were no vitals filed for this visit.      Subjective Assessment - 02/29/16 1019    Subjective Reports doing a lot of yard work over the weekend, with som back pain.     Patient Stated Goals Pt's goal for physical therapy to is regain normal use of R arm and keep from falling.   Currently in Pain? Yes   Pain Score 2    Pain Location Back   Pain Orientation Right;Left;Lower   Pain Descriptors / Indicators Sore   Pain Type Acute pain   Pain Onset 1 to 4 weeks ago   Pain Frequency Intermittent   Aggravating Factors  injured doing yardwork   Pain Relieving Factors Heating pad, movement helping                         [facility] Adult PT Treatment/Exercise - 02/29/16 1021      Transfers   Transfers Sit to Stand;Stand to Sit   Sit to Stand 6: Modified  independent (Device/Increase time);Without upper extremity assist;From chair/3-in-1   Stand to Sit 6: Modified independent (Device/Increase time);Without upper extremity assist;To chair/3-in-1   Number of Reps Other reps (comment);Other sets (comment)  8 reps each from 20", 18" surfaces   Transfer Cueing Cues provided for transfer technique     Ambulation/Gait   Ambulation/Gait Yes   Ambulation/Gait Assistance 7: Independent   Ambulation Distance (Feet) 460 Feet  then 600    Assistive device None  bilateral walking poles for arm swing facilitation   Gait Pattern Step-through pattern;Decreased arm swing - right;Decreased step length - right;Decreased stance time - right;Decreased trunk rotation;Poor foot clearance - right;Poor foot clearance - left    Ambulation Surface Level;Indoor   Gait Comments Gait activities at least 500 ft no device, with environmental scanning tasks, with occasional cues for reciprocal arm swing.  No LOB or veering noted.  Slight decrease in RUE arm swing noted while looking for cards with environmental scanning tasks.     High Level Balance   High Level Balance Activities Backward walking;Side stepping  with coordinated UEs 2-3 times 30 ft    High Level Balance Comments Ankle, hip, step strategy work at counter:  heel/toe raises x 10 reps, then at wall-wall bumps x 10 reps, then at counter side step and weightshift, then forward step and weightshift alternating legs bilaterally                PT Education - 02/29/16 2210    Education provided Yes   Education Details HEP initiated-see instructions   Person(s) Educated Patient   Methods Explanation;Demonstration;Handout   Comprehension Verbalized understanding;Returned demonstration          PT Short Term Goals - 02/18/16 1243      PT SHORT TERM GOAL #1   Title Pt will be independent with HEP for improved balance, transfers, gait.  TARGET 03/19/16   Time 4   Period Weeks   Status New     PT SHORT TERM GOAL #2   Title Pt will verbalize understanding of local community Parkinson's related resources.   Time 4   Period Weeks   Status New     PT SHORT TERM GOAL #3   Title Pt will improve 5x sit<>stand to less than or equal to 11.5 seconds for improved efficiency and safety with gait.   Time 4   Period Weeks   Status New           PT Long Term Goals - 02/18/16 1245      PT LONG TERM GOAL #1   Title Pt will verbalize understanding of fall prevention in home environment.  TARGET 04/18/16   Time 8   Period Weeks   Status New     PT LONG TERM GOAL #2   Title Pt will perform at least 8 of 10 reps of sit<>stand transfers from lower than 18" surfaces, independently, for improved ease of transfers.   Time 8   Period Weeks   Status New      PT LONG TERM GOAL #3   Title Pt will improve TUG and TUG cognitive to <10% difference in times, for improved dual tasking with gait.   Time 8   Period Weeks   Status New     PT LONG TERM GOAL #4   Title Pt will improve 4-square step test to less than or equal to 11 seconds with no LOB, for improved balance with change of directions.   Time 8  Period Weeks   Status New     PT LONG TERM GOAL #5   Title Pt will verbalize plans for continued community fitness upon D/C from PT.   Time 8   Period Weeks   Status New               Plan - 02/29/16 2211    Clinical Impression Statement Focused treatment session today on gait activities, transfers, initial balance activities.  Due to back pain from excessive yard work over the weekend, did not initiate further PWR! Moves exercises today, but overall, no increase in pain during session today.  Pt demonstrates improved awareness of arm swing with gait activities.  Pt will continue to benefit from skilled PT to address posture, balance, gait.   Rehab Potential Good   PT Frequency 2x / week  1x/wk for 1 wk, then   PT Duration 8 weeks   PT Treatment/Interventions ADLs/Self Care Home Management;Functional mobility training;Gait training;Therapeutic activities;Therapeutic exercise;Balance training;Neuromuscular re-education;Patient/family education   PT Next Visit Plan Initiate standing PWR! Moves; review low surface transfers, gait training-working on arm swing, timing and coordination of gait   Consulted and Agree with Plan of Care Patient      Patient will benefit from skilled therapeutic intervention in order to improve the following deficits and impairments:  Abnormal gait, Decreased balance, Decreased mobility, Difficulty walking, Impaired flexibility, Postural dysfunction  Visit Diagnosis: Other symptoms and signs involving the nervous system  Other abnormalities of gait and mobility     Problem List Patient Active Problem  List   Diagnosis Date Noted  . Parkinson disease ([place]) 01/04/2016  . Seasonal and perennial allergic rhinitis 06/21/2014  . Asthma, mild intermittent, well-controlled 06/21/2014  . Pseudomonas aeruginosa colonization 10/08/2012  . Hemorrhage of rectum and anus 07/01/2012  . CAD (coronary artery disease) 11/15/2011  . Hypertension 11/15/2011  . Hyperlipidemia 11/15/2011  . Obesity 11/15/2011  . Ureteral calculus, right 06/12/2011    Kirat Mezquita W. 02/29/2016, 10:19 PM  Frazier Butt., PT  [facility] [address] [place], [place], [address] Phone: [phone]   Fax:  [phone]  Name: Todd Rice MRN: [id number] Date of Birth: 12-21-1955

## 2016-02-29 NOTE — Therapy (Signed)
[facility] [address] [place], [place], [address] Phone: [phone]   Fax:  [phone]  Occupational Therapy Treatment  Patient Details  Name: Todd Rice MRN: [id number] Date of Birth: 01-13-1956 Referring Provider: Dr. [name]  Encounter Date: 02/29/2016      OT End of Session - 02/29/16 1034    Visit Number 3   Number of Visits 17   Date for OT Re-Evaluation 04/08/16   Authorization Type cone UMR   OT Start Time [phone]   OT Stop Time 0930   OT Time Calculation (min) 38 min   Activity Tolerance Patient tolerated treatment well   Behavior During Therapy [facility] for tasks assessed/performed      Past Medical History:  Diagnosis Date  . Adult BMI 50.0-59.9 kg/sq m    52.77  . Anal fissure   . Anxiety   . Diabetes mellitus without complication (HCC)    borderline  . GERD (gastroesophageal reflux disease)    30 years ago, maybe from peptic ulcer  . H/O: gout    STABLE  . Hepatic steatosis 10/01/11  . History of kidney stones   . Hyperlipidemia   . Hypertension   . Kidney tumor    right upper kidney tumor, monitoring  . OSA on CPAP   . Parkinson disease ([place]) 01/04/2016  . Right ureteral stone   . Vitamin D deficiency   . Weakness     Past Surgical History:  Procedure Laterality Date  . ANAL FISSURE REPAIR  10-12-2000  . COLONOSCOPY N/A 07/01/2012   Procedure: COLONOSCOPY;  Surgeon: Lafayette Dragon, MD;  [facility];  Service: Endoscopy;  Laterality: N/A;  . CYSTOSCOPY WITH RETROGRADE PYELOGRAM, URETEROSCOPY AND STENT PLACEMENT Bilateral 02/26/2013   Procedure: CYSTOSCOPY WITH RETROGRADE PYELOGRAM, URETEROSCOPY AND STENT PLACEMENT;  Surgeon: Alexis Frock, MD;  [facility];  Service: Urology;  Laterality: Bilateral;  . HOLMIUM LASER APPLICATION Bilateral [id number]   Procedure: HOLMIUM LASER APPLICATION;  Surgeon: Alexis Frock, MD;  [facility];  Service: Urology;  Laterality:  Bilateral;  . kidney stone removal     06/2011-stent placement  . LEFT MEDIAL FEMORAL CONDYLE DEBRIDEMENT & DRILLING/ REMOVAL LOOSE BODY  09-15-2003  . NASAL SEPTUM SURGERY  1985  . STONE EXTRACTION WITH BASKET  06/12/2011   Procedure: STONE EXTRACTION WITH BASKET;  Surgeon: Claybon Jabs, MD;  Location: [facility];  Service: Urology;  Laterality: Right;    There were no vitals filed for this visit.      Subjective Assessment - 02/29/16 0936    Patient Stated Goals improve function in right hand   Currently in Pain? Yes   Pain Score 3    Pain Location Back   Pain Descriptors / Indicators Aching   Pain Type Acute pain   Pain Onset 1 to 4 weeks ago   Pain Frequency Intermittent   Aggravating Factors  injured doing yardwork   Pain Relieving Factors rest, heat         Treatment: Pt was instructed in PWR! Hands and he performed 10 reps each except for PWR! step 5 reps each Adapted strategies for cutting food and fastening buttons, pt practiced writing with improved letter size. Placing grooved pegs in pegboard, mod difficulty with RUE, min difficulty with LUE, removing with end hand manipulation.                        OT Short Term Goals -  02/18/16 1305      OT SHORT TERM GOAL #1   Title I with PD  specific HEP.   Time 4   Period Weeks   Status New     OT SHORT TERM GOAL #2   Title I with adapted strategies for ADLs/ IADLs.   Time 4   Period Weeks   Status New     OT SHORT TERM GOAL #3   Title Pt will demonstrate ability to write a sentence with 95% legibility, good letter size with only minimal decrease in letter size.   Time 4   Period Weeks   Status New     OT SHORT TERM GOAL #4   Title Pt will demonstrate improved RUE functional use as evidenced by increasing box/ blocks score to 43 blocks or greater.   Baseline RUE 39 blocks, LUE 48 blocks   Time 4   Period Weeks   Status New     OT SHORT TERM GOAL #5   Title Pt will  increase bilateral standing functional reach to 10 inches or greater bilaterally for decreased fall risk   Baseline RUE 8.5, LUE 9 inches   Time 4   Period Weeks   Status New           OT Long Term Goals - 02/10/16 1242      OT LONG TERM GOAL #1   Title Pt will verbalize understanding of community resources and ways to prevent future PD related  complications.   Time 8   Period Weeks   Status New     OT LONG TERM GOAL #2   Title Pt will demonstrate improved fine motor coordination as evidenced by decreasing RUE 9 hole peg test to 35 secs or less.   Baseline RUE 39.81 secs, LUE 30.72 secs   Time 8   Period Weeks   Status New     OT LONG TERM GOAL #3   Title Pt will demonstrate improved ability to fasten buttons as evidenced by decreasing 3 button/ unbutton test to 45 secs or less.   Time 8   Period Weeks   Status New     OT LONG TERM GOAL #4   Title Pt will verbalize understanding of ways to keep thinking skills sharp and ways to compensate for memory changes PRN..   Time 8   Period Weeks   Status New     OT LONG TERM GOAL #5   Title Pt will report UE pain no greater than 3/10 for ADLs/IADLs.   Time 8   Period Weeks   Status New               Plan - 02/29/16 1014    Clinical Impression Statement Pt is progressing towards goals for fine motor coordination, handwriting and adapted strategies for ADLS   Rehab Potential Good   OT Frequency 2x / week   OT Duration 8 weeks   OT Treatment/Interventions Self-care/ADL training;Moist Heat;Fluidtherapy;DME and/or AE instruction;Splinting;Patient/family education;Balance training;Therapeutic exercises;Gait Training;Contrast Bath;Ultrasound;Therapeutic exercise;Therapeutic activities;Cognitive remediation/compensation;Passive range of motion;Functional Mobility Training;Neuromuscular education;Cryotherapy;Electrical Stimulation;Parrafin;Energy conservation;Manual Therapy;Visual/perceptual remediation/compensation   Plan  reinforce PWR! seated progress to modified quadraped or supine PWR!   Consulted and Agree with Plan of Care Patient      Patient will benefit from skilled therapeutic intervention in order to improve the following deficits and impairments:  Abnormal gait, Decreased range of motion, Difficulty walking, Impaired flexibility, Decreased safety awareness, Decreased endurance, Decreased activity tolerance, Decreased knowledge of precautions, Impaired  tone, Impaired UE functional use, Pain, Decreased balance, Decreased cognition, Decreased mobility, Decreased strength  Visit Diagnosis: Other symptoms and signs involving the nervous system  Other lack of coordination  Other symptoms and signs involving the musculoskeletal system    Problem List Patient Active Problem List   Diagnosis Date Noted  . Parkinson disease ([place]) 01/04/2016  . Seasonal and perennial allergic rhinitis 06/21/2014  . Asthma, mild intermittent, well-controlled 06/21/2014  . Pseudomonas aeruginosa colonization 10/08/2012  . Hemorrhage of rectum and anus 07/01/2012  . CAD (coronary artery disease) 11/15/2011  . Hypertension 11/15/2011  . Hyperlipidemia 11/15/2011  . Obesity 11/15/2011  . Ureteral calculus, right 06/12/2011    RINE,KATHRYN 02/29/2016, 10:35 AM Theone Murdoch, OTR/L [phone] Phone: [phone] 10:40 AM 02/29/16 [facility] [address] [place], [place], [address] Phone: [phone]   Fax:  [phone]  Name: Todd Rice MRN: [id number] Date of Birth: 02/01/1956

## 2016-02-29 NOTE — Patient Instructions (Addendum)
PWR! Hands  With arms stretched out in front of you (elbows straight), perform the following: PWR! Rock: Move wrists up and down BIG PWR! Twist: Twist palms up and down BIG  Then, start with elbows bent and hands closed. PWR! Step: Touch index finger to thumb while keeping other fingers straight. Flick fingers out BIG (thumb out/straighten fingers). Repeat with other fingers. (Step your thumb to each finger). PWR! Hands: Push hands out BIG. Elbows straight, wrists up, fingers open and spread apart BIG. (Can also perform by pushing down on table, chair, knees. Push above head, out to the side, behind you, in front of you.)   ** Make each movement big and deliberate so that you feel the movement.  Perform at least 10 repetitions 1x/day, but perform PWR! hands throughout the day when you are having trouble using your hands (picking up/manipulating small objects, writing, eating, typing, sewing, buttoning, etc.). 

## 2016-03-01 ENCOUNTER — Ambulatory Visit: Payer: 59 | Admitting: Occupational Therapy

## 2016-03-01 DIAGNOSIS — R2689 Other abnormalities of gait and mobility: Secondary | ICD-10-CM | POA: Diagnosis not present

## 2016-03-01 DIAGNOSIS — R29898 Other symptoms and signs involving the musculoskeletal system: Secondary | ICD-10-CM

## 2016-03-01 DIAGNOSIS — R29818 Other symptoms and signs involving the nervous system: Secondary | ICD-10-CM

## 2016-03-01 DIAGNOSIS — R278 Other lack of coordination: Secondary | ICD-10-CM

## 2016-03-01 DIAGNOSIS — R2681 Unsteadiness on feet: Secondary | ICD-10-CM | POA: Diagnosis not present

## 2016-03-01 DIAGNOSIS — R471 Dysarthria and anarthria: Secondary | ICD-10-CM | POA: Diagnosis not present

## 2016-03-01 DIAGNOSIS — R293 Abnormal posture: Secondary | ICD-10-CM | POA: Diagnosis not present

## 2016-03-01 NOTE — Therapy (Signed)
[facility] [address] [place], [place], [address] Phone: [phone]   Fax:  [phone]  Occupational Therapy Treatment  Patient Details  Name: Todd Rice MRN: [id number] Date of Birth: 29-Dec-1955 Referring Provider: Dr. [name]  Encounter Date: 03/01/2016      OT End of Session - 03/01/16 1634    Visit Number 4   Number of Visits 17   Date for OT Re-Evaluation 04/08/16   Authorization Type [employer]   OT Start Time 1535   OT Stop Time 1617   OT Time Calculation (min) 42 min   Activity Tolerance Patient tolerated treatment well   Behavior During Therapy [facility] for tasks assessed/performed      Past Medical History:  Diagnosis Date  . Adult BMI 50.0-59.9 kg/sq m    52.77  . Anal fissure   . Anxiety   . Diabetes mellitus without complication (HCC)    borderline  . GERD (gastroesophageal reflux disease)    30 years ago, maybe from peptic ulcer  . H/O: gout    STABLE  . Hepatic steatosis 10/01/11  . History of kidney stones   . Hyperlipidemia   . Hypertension   . Kidney tumor    right upper kidney tumor, monitoring  . OSA on CPAP   . Parkinson disease ([place]) 01/04/2016  . Right ureteral stone   . Vitamin D deficiency   . Weakness     Past Surgical History:  Procedure Laterality Date  . ANAL FISSURE REPAIR  10-12-2000  . COLONOSCOPY N/A 07/01/2012   Procedure: COLONOSCOPY;  Surgeon: Lafayette Dragon, MD;  Location: [facility];  Service: Endoscopy;  Laterality: N/A;  . CYSTOSCOPY WITH RETROGRADE PYELOGRAM, URETEROSCOPY AND STENT PLACEMENT Bilateral 02/26/2013   Procedure: CYSTOSCOPY WITH RETROGRADE PYELOGRAM, URETEROSCOPY AND STENT PLACEMENT;  Surgeon: Alexis Frock, MD;  Location: [facility];  Service: Urology;  Laterality: Bilateral;  . HOLMIUM LASER APPLICATION Bilateral [id number]   Procedure: HOLMIUM LASER APPLICATION;  Surgeon: Alexis Frock, MD;  Location: [facility];  Service: Urology;  Laterality:  Bilateral;  . kidney stone removal     06/2011-stent placement  . LEFT MEDIAL FEMORAL CONDYLE DEBRIDEMENT & DRILLING/ REMOVAL LOOSE BODY  09-15-2003  . NASAL SEPTUM SURGERY  1985  . STONE EXTRACTION WITH BASKET  06/12/2011   Procedure: STONE EXTRACTION WITH BASKET;  Surgeon: Claybon Jabs, MD;  Location: [facility];  Service: Urology;  Laterality: Right;    There were no vitals filed for this visit.      Subjective Assessment - 02/29/16 0936    Patient Stated Goals improve function in right hand   Currently in Pain? Yes   Pain Score 3    Pain Location Back   Pain Descriptors / Indicators Aching   Pain Type Acute pain   Pain Onset 1 to 4 weeks ago   Pain Frequency Intermittent   Aggravating Factors  injured doing yardwork   Pain Relieving Factors rest, heat         Treatment: Education regarding ways to prevent future PD related complications and community resources such as PWR! class, cycle class, pt is aware of rock steady class. Performing daily activities with big movements, handout provided and reviewed with pt. Dual tasking to amb while tossing ball and performing category generation, min v.c.                PWR [facility]) - 03/01/16 1635    PWR! exercises Moves in sitting;Moves in  quadraped   PWR! Up x10   PWR! Rock x10   PWR! Twist x10   PWR! Step x10   Comments modified quadraped for step only, all other exercises quadraped mat on floor, min-mod v.c./demo   PWR! Up x10   PWR! Rock x10   PWR! Twist x10   PWR! Step x10   Comments min v.c./ demo             OT Education - 03/01/16 1631    Education provided Yes   Education Details PWR seated review, PWR! quadraped basic 4, performing daily activities with big movements, ways to prevent future PD complications and discussion about community exercise opportunities.   Person(s) Educated Patient;Spouse   Methods Explanation;Demonstration;Verbal cues;Handout   Comprehension Verbalized  understanding;Returned demonstration;Verbal cues required          OT Short Term Goals - 02/18/16 1305      OT SHORT TERM GOAL #1   Title I with PD  specific HEP.   Time 4   Period Weeks   Status New     OT SHORT TERM GOAL #2   Title I with adapted strategies for ADLs/ IADLs.   Time 4   Period Weeks   Status New     OT SHORT TERM GOAL #3   Title Pt will demonstrate ability to write a sentence with 95% legibility, good letter size with only minimal decrease in letter size.   Time 4   Period Weeks   Status New     OT SHORT TERM GOAL #4   Title Pt will demonstrate improved RUE functional use as evidenced by increasing box/ blocks score to 43 blocks or greater.   Baseline RUE 39 blocks, LUE 48 blocks   Time 4   Period Weeks   Status New     OT SHORT TERM GOAL #5   Title Pt will increase bilateral standing functional reach to 10 inches or greater bilaterally for decreased fall risk   Baseline RUE 8.5, LUE 9 inches   Time 4   Period Weeks   Status New           OT Long Term Goals - 02/10/16 1242      OT LONG TERM GOAL #1   Title Pt will verbalize understanding of community resources and ways to prevent future PD related  complications.   Time 8   Period Weeks   Status New     OT LONG TERM GOAL #2   Title Pt will demonstrate improved fine motor coordination as evidenced by decreasing RUE 9 hole peg test to 35 secs or less.   Baseline RUE 39.81 secs, LUE 30.72 secs   Time 8   Period Weeks   Status New     OT LONG TERM GOAL #3   Title Pt will demonstrate improved ability to fasten buttons as evidenced by decreasing 3 button/ unbutton test to 45 secs or less.   Time 8   Period Weeks   Status New     OT LONG TERM GOAL #4   Title Pt will verbalize understanding of ways to keep thinking skills sharp and ways to compensate for memory changes PRN..   Time 8   Period Weeks   Status New     OT LONG TERM GOAL #5   Title Pt will report UE pain no greater than  3/10 for ADLs/IADLs.   Time 8   Period Weeks   Status New  Plan - 03/01/16 1632    Clinical Impression Statement Pt is progressing towards goals. He demonstrates understanding of HEP   Rehab Potential Good   OT Frequency 2x / week   OT Duration 8 weeks   OT Treatment/Interventions Self-care/ADL training;Moist Heat;Fluidtherapy;DME and/or AE instruction;Splinting;Patient/family education;Balance training;Therapeutic exercises;Gait Training;Contrast Bath;Ultrasound;Therapeutic exercise;Therapeutic activities;Cognitive remediation/compensation;Passive range of motion;Functional Mobility Training;Neuromuscular education;Cryotherapy;Electrical Stimulation;Parrafin;Energy conservation;Manual Therapy;Visual/perceptual remediation/compensation   Plan reinforce quadraped PWR!,add supine  or prone PWR!  big movements with functional activity   OT Home Exercise Plan issued PWR! seated quadraped and coordination HEP, ways to prevent future PD related complications, big movements with ADLs handout   Consulted and Agree with Plan of Care Patient;Family member/caregiver      Patient will benefit from skilled therapeutic intervention in order to improve the following deficits and impairments:  Abnormal gait, Decreased range of motion, Difficulty walking, Impaired flexibility, Decreased safety awareness, Decreased endurance, Decreased activity tolerance, Decreased knowledge of precautions, Impaired tone, Impaired UE functional use, Pain, Decreased balance, Decreased cognition, Decreased mobility, Decreased strength  Visit Diagnosis: Other symptoms and signs involving the nervous system  Other abnormalities of gait and mobility  Other lack of coordination  Other symptoms and signs involving the musculoskeletal system    Problem List Patient Active Problem List   Diagnosis Date Noted  . Parkinson disease ([place]) 01/04/2016  . Seasonal and perennial allergic rhinitis 06/21/2014  .  Asthma, mild intermittent, well-controlled 06/21/2014  . Pseudomonas aeruginosa colonization 10/08/2012  . Hemorrhage of rectum and anus 07/01/2012  . CAD (coronary artery disease) 11/15/2011  . Hypertension 11/15/2011  . Hyperlipidemia 11/15/2011  . Obesity 11/15/2011  . Ureteral calculus, right 06/12/2011    Quron Ruddy 03/01/2016, 4:43 PM  [facility] [address] [place], [place], [address] Phone: [phone]   Fax:  [phone]  Name: ANANTH MAISONET MRN: [id number] Date of Birth: 06/30/1955

## 2016-03-01 NOTE — Patient Instructions (Signed)
Performing Daily Activities with Big Movements  Pick at least one activity a day and perform with BIG, DELIBERATE movements/effort. This can make the activity easier and turn daily activities into exercise!  If you are standing during the activity, make sure to keep feet apart and stand with good/big/PWR! UP posture.  Examples: Dressing - Push arms in sleeves, twist when putting on jacket, push foot into pants, open hands to pull down shirt/put on socks/pull up pants Bathing - Wash/dry with long strokes Brushing your teeth - Big, slow movements Cutting food - Long deliberate cuts Opening jar/bottle - Move as much as you can with each turn Picking up a cup/bottle - Open hand up big and get object all the way in palm Hanging up clothes/getting clothes down from closet - Reach with big effort Putting away groceries/dishes - Reach with big effort Wiping counter/table - Move in big, long strokes Stirring while cooking - Exaggerate movement Cleaning windows - Move in big, long strokes Sweeping - Move arms in big, long strokes Vacuuming - Push with big movement Folding clothes - Exaggerate arm movements Washing car - Move in big, long strokes Raking - Move arms in big, long strokes Changing light bulb - Move as much as you can with each turn Using a screwdriver - Move as much as you can with each turn Walking into a store/restaurant - Walk with big steps, swing arms if able Standing up from a chair/recliner/sofa - Scoot forward, lean forward, and stand with big effort 

## 2016-03-03 ENCOUNTER — Telehealth: Payer: Self-pay | Admitting: Neurology

## 2016-03-03 DIAGNOSIS — G4733 Obstructive sleep apnea (adult) (pediatric): Secondary | ICD-10-CM

## 2016-03-03 NOTE — Telephone Encounter (Signed)
Called patient , explained this plan;  Start auto BIPAP 17/14 through 10/16 cm with use of Amara view interface , medium size.   Patient was happy to hear that this mask will be used- he loved it !

## 2016-03-06 NOTE — Telephone Encounter (Signed)
I called pt. He needs a follow up appt 60-90 days after starting bipap. (end of January-February).  Sent order for bipap to [facility]. Letter not sent (no f/u appt scheduled yet).

## 2016-03-07 NOTE — Telephone Encounter (Signed)
I spoke to pt. He is agreeable to a 06/07/16 follow up for bipap with Dr. Brett Fairy. Will send letter. Pt verbalized understanding.

## 2016-03-08 ENCOUNTER — Ambulatory Visit: Payer: 59 | Admitting: Physical Therapy

## 2016-03-08 ENCOUNTER — Ambulatory Visit: Payer: 59 | Attending: Neurology | Admitting: Occupational Therapy

## 2016-03-08 DIAGNOSIS — R2689 Other abnormalities of gait and mobility: Secondary | ICD-10-CM

## 2016-03-08 DIAGNOSIS — R293 Abnormal posture: Secondary | ICD-10-CM | POA: Diagnosis not present

## 2016-03-08 DIAGNOSIS — R29898 Other symptoms and signs involving the musculoskeletal system: Secondary | ICD-10-CM | POA: Insufficient documentation

## 2016-03-08 DIAGNOSIS — R471 Dysarthria and anarthria: Secondary | ICD-10-CM | POA: Diagnosis not present

## 2016-03-08 DIAGNOSIS — R278 Other lack of coordination: Secondary | ICD-10-CM | POA: Insufficient documentation

## 2016-03-08 DIAGNOSIS — R2681 Unsteadiness on feet: Secondary | ICD-10-CM | POA: Insufficient documentation

## 2016-03-08 DIAGNOSIS — R29818 Other symptoms and signs involving the nervous system: Secondary | ICD-10-CM | POA: Insufficient documentation

## 2016-03-08 DIAGNOSIS — N3020 Other chronic cystitis without hematuria: Secondary | ICD-10-CM | POA: Diagnosis not present

## 2016-03-08 NOTE — Therapy (Signed)
[facility] [address] [place], [place], [address] Phone: [phone]   Fax:  [phone]  Physical Therapy Treatment  Patient Details  Name: MEDRICK DEHAAS MRN: [id number] Date of Birth: 08-May-1956 Referring Provider: [name]  Encounter Date: 03/08/2016      PT End of Session - 03/08/16 1140    Visit Number 3   Number of Visits 13   Date for PT Re-Evaluation 04/18/16   Authorization Type UMR   PT Start Time 0850   PT Stop Time 0931   PT Time Calculation (min) 41 min   Activity Tolerance Patient tolerated treatment well   Behavior During Therapy [facility] for tasks assessed/performed      Past Medical History:  Diagnosis Date  . Adult BMI 50.0-59.9 kg/sq m    52.77  . Anal fissure   . Anxiety   . Diabetes mellitus without complication (HCC)    borderline  . GERD (gastroesophageal reflux disease)    30 years ago, maybe from peptic ulcer  . H/O: gout    STABLE  . Hepatic steatosis 10/01/11  . History of kidney stones   . Hyperlipidemia   . Hypertension   . Kidney tumor    right upper kidney tumor, monitoring  . OSA on CPAP   . Parkinson disease ([place]) 01/04/2016  . Right ureteral stone   . Vitamin D deficiency   . Weakness     Past Surgical History:  Procedure Laterality Date  . ANAL FISSURE REPAIR  10-12-2000  . COLONOSCOPY N/A 07/01/2012   Procedure: COLONOSCOPY;  Surgeon: Lafayette Dragon, MD;  [facility];  Service: Endoscopy;  Laterality: N/A;  . CYSTOSCOPY WITH RETROGRADE PYELOGRAM, URETEROSCOPY AND STENT PLACEMENT Bilateral 02/26/2013   Procedure: CYSTOSCOPY WITH RETROGRADE PYELOGRAM, URETEROSCOPY AND STENT PLACEMENT;  Surgeon: Alexis Frock, MD;  [facility];  Service: Urology;  Laterality: Bilateral;  . HOLMIUM LASER APPLICATION Bilateral [id number]   Procedure: HOLMIUM LASER APPLICATION;  Surgeon: Alexis Frock, MD;  [facility];  Service: Urology;  Laterality: Bilateral;  .  kidney stone removal     06/2011-stent placement  . LEFT MEDIAL FEMORAL CONDYLE DEBRIDEMENT & DRILLING/ REMOVAL LOOSE BODY  09-15-2003  . NASAL SEPTUM SURGERY  1985  . STONE EXTRACTION WITH BASKET  06/12/2011   Procedure: STONE EXTRACTION WITH BASKET;  Surgeon: Claybon Jabs, MD;  Location[facility];  Service: Urology;  Laterality: Right;    There were no vitals filed for this visit.      Subjective Assessment - 03/08/16 0854    Subjective denies falls or changes   Patient Stated Goals Pt's goal for physical therapy to is regain normal use of R arm and keep from falling.   Currently in Pain? No/denies                [facility] Adult PT Treatment/Exercise - 03/08/16 0001      Transfers   Transfers Sit to Stand;Stand to Sit   Sit to Stand 6: Modified independent (Device/Increase time);Without upper extremity assist;From chair/3-in-1   Stand to Sit 6: Modified independent (Device/Increase time);Without upper extremity assist;To chair/3-in-1   Comments Reports not having any problems with transfers now     Ambulation/Gait   Ambulation/Gait Yes   Ambulation/Gait Assistance 5: Supervision   Ambulation Distance (Feet) 850 Feet  x 1, 440' x 1, 220' x 2   Assistive device None;Other (Comment)  bilater walking poles   Gait Pattern Step-through pattern;Decreased arm swing -  right;Decreased step length - right;Decreased stance time - right;Decreased trunk rotation;Poor foot clearance - right;Poor foot clearance - left   Ambulation Surface Level;Unlevel;Indoor;Outdoor;Paved   Gait Comments Worked on gait without poles focusing on cadence and armswing on the R;Gait with poles working on coordination of gait and poles.  Needs cues to stay in sequence as he has decreased armswing on R and gets out of sequence.  By end of training was able to maintain sequence and quickly recover if out of sequence.  No LOB on uneven pavement.     High Level Balance   High Level Balance  Activities Marching forwards;Other (comment)   High Level Balance Comments Lunge and hold alternating LE's x 40';Marching alternating arms/legs raising arms x 40' x 2           PWR [facility]) - 03/08/16 1133    PWR! exercises Moves in standing   PWR! Up 20   PWR! Rock 20   PWR! Twist 20   PWR Step 20   Comments In standing without UE support;needs cues for technique and intensity;no LOB during moves             PT Education - 03/08/16 1139    Education provided Yes   Education Details PWR! standing, gait with poles, coordinating/encouraging armswing on R   Person(s) Educated Patient   Methods Explanation;Demonstration;Tactile cues;Verbal cues;Handout   Comprehension Verbalized understanding;Returned demonstration          PT Short Term Goals - 02/18/16 1243      PT SHORT TERM GOAL #1   Title Pt will be independent with HEP for improved balance, transfers, gait.  TARGET 03/19/16   Time 4   Period Weeks   Status New     PT SHORT TERM GOAL #2   Title Pt will verbalize understanding of local community Parkinson's related resources.   Time 4   Period Weeks   Status New     PT SHORT TERM GOAL #3   Title Pt will improve 5x sit<>stand to less than or equal to 11.5 seconds for improved efficiency and safety with gait.   Time 4   Period Weeks   Status New           PT Long Term Goals - 02/18/16 1245      PT LONG TERM GOAL #1   Title Pt will verbalize understanding of fall prevention in home environment.  TARGET 04/18/16   Time 8   Period Weeks   Status New     PT LONG TERM GOAL #2   Title Pt will perform at least 8 of 10 reps of sit<>stand transfers from lower than 18" surfaces, independently, for improved ease of transfers.   Time 8   Period Weeks   Status New     PT LONG TERM GOAL #3   Title Pt will improve TUG and TUG cognitive to <10% difference in times, for improved dual tasking with gait.   Time 8   Period Weeks   Status New     PT LONG TERM  GOAL #4   Title Pt will improve 4-square step test to less than or equal to 11 seconds with no LOB, for improved balance with change of directions.   Time 8   Period Weeks   Status New     PT LONG TERM GOAL #5   Title Pt will verbalize plans for continued community fitness upon D/C from PT.   Time 8   Period Weeks  Status New               Plan - 03/08/16 1140    Clinical Impression Statement Tolerated treatment well today with a few rest breaks during activities;Improvement during session noted in gait with walking poles.  Continue PT per POC.   Rehab Potential Good   PT Frequency 2x / week  1x/wk for 1 wk, then   PT Duration 8 weeks   PT Treatment/Interventions ADLs/Self Care Home Management;Functional mobility training;Gait training;Therapeutic activities;Therapeutic exercise;Balance training;Neuromuscular re-education;Patient/family education   PT Next Visit Plan Review standing PWR! Moves;gait training with poles;coordinated balance/gait activities;PWR! flow standing   Consulted and Agree with Plan of Care Patient      Patient will benefit from skilled therapeutic intervention in order to improve the following deficits and impairments:  Abnormal gait, Decreased balance, Decreased mobility, Difficulty walking, Impaired flexibility, Postural dysfunction  Visit Diagnosis: Other symptoms and signs involving the nervous system  Other abnormalities of gait and mobility     Problem List Patient Active Problem List   Diagnosis Date Noted  . Parkinson disease ([place]) 01/04/2016  . Seasonal and perennial allergic rhinitis 06/21/2014  . Asthma, mild intermittent, well-controlled 06/21/2014  . Pseudomonas aeruginosa colonization 10/08/2012  . Hemorrhage of rectum and anus 07/01/2012  . CAD (coronary artery disease) 11/15/2011  . Hypertension 11/15/2011  . Hyperlipidemia 11/15/2011  . Obesity 11/15/2011  . Ureteral calculus, right 06/12/2011    Narda Bonds 03/08/2016, 11:43 AM  [facility] [address] [place], [place], [address] Phone: [phone]   Fax:  [phone]  Name: DAWIT MCGUIRT MRN: [id number] Date of Birth: 1956/01/26  Einar [place] 03/08/16 11:43 AM Phone: [phone] Fax: [phone]

## 2016-03-08 NOTE — Patient Instructions (Signed)
Provided PWR! Standing moves x 20 each 1x/day

## 2016-03-08 NOTE — Therapy (Signed)
[facility] [address] [place], [place], [address] Phone: [phone]   Fax:  [phone]  Occupational Therapy Treatment  Patient Details  Name: Todd Rice MRN: [id number] Date of Birth: 06-18-1955 Referring Provider: Dr. [name]  Encounter Date: 03/08/2016      OT End of Session - 03/08/16 0930    Visit Number 5   Number of Visits 17   Date for OT Re-Evaluation 04/08/16   Authorization Type [facility] [employer]   OT Start Time 0934   OT Stop Time 1015   OT Time Calculation (min) 41 min   Activity Tolerance Patient tolerated treatment well   Behavior During Therapy [facility] for tasks assessed/performed      Past Medical History:  Diagnosis Date  . Adult BMI 50.0-59.9 kg/sq m    52.77  . Anal fissure   . Anxiety   . Diabetes mellitus without complication (HCC)    borderline  . GERD (gastroesophageal reflux disease)    30 years ago, maybe from peptic ulcer  . H/O: gout    STABLE  . Hepatic steatosis 10/01/11  . History of kidney stones   . Hyperlipidemia   . Hypertension   . Kidney tumor    right upper kidney tumor, monitoring  . OSA on CPAP   . Parkinson disease ([place]) 01/04/2016  . Right ureteral stone   . Vitamin D deficiency   . Weakness     Past Surgical History:  Procedure Laterality Date  . ANAL FISSURE REPAIR  10-12-2000  . COLONOSCOPY N/A 07/01/2012   Procedure: COLONOSCOPY;  Surgeon: Lafayette Dragon, MD;  [facility];  Service: Endoscopy;  Laterality: N/A;  . CYSTOSCOPY WITH RETROGRADE PYELOGRAM, URETEROSCOPY AND STENT PLACEMENT Bilateral 02/26/2013   Procedure: CYSTOSCOPY WITH RETROGRADE PYELOGRAM, URETEROSCOPY AND STENT PLACEMENT;  Surgeon: Alexis Frock, MD;  [facility]: [facility];  Service: Urology;  Laterality: Bilateral;  . HOLMIUM LASER APPLICATION Bilateral [id number]   Procedure: HOLMIUM LASER APPLICATION;  Surgeon: Alexis Frock, MD;  Location: [facility];  Service: Urology;  Laterality:  Bilateral;  . kidney stone removal     06/2011-stent placement  . LEFT MEDIAL FEMORAL CONDYLE DEBRIDEMENT & DRILLING/ REMOVAL LOOSE BODY  09-15-2003  . NASAL SEPTUM SURGERY  1985  . STONE EXTRACTION WITH BASKET  06/12/2011   Procedure: STONE EXTRACTION WITH BASKET;  Surgeon: Claybon Jabs, MD;  Location: [facility];  Service: Urology;  Laterality: Right;    There were no vitals filed for this visit.      Subjective Assessment - 03/08/16 1031    Subjective  Pt reports back pain improved with exercises   Patient Stated Goals improve function in right hand   Currently in Pain? No/denies         Neuro re-ed:  PWR! Moves (basic 4) in supine and quadraped (modified quadraped for PWR! Step) x 10 each with min cues For incr movement amplitude.  Reviewed importance of using large amplitude movements and associated trunk movements during functional activities to decr risk of back and shoulder pain (future complications) and instructed pt to put breaks with big re-start of movement/PWR! Hands for repetitive tasks due to incr bradykinesia with repetition.  Pt verbalized understanding.                        OT Education - 03/08/16 1032    Education Details PWR supine (basic 4); reviewed PWR! quadraped (basic 4); instructed in use of  PWR! hands with work activity, breaking down tasks and adding pauses to re-start big or use PWR! hands with repetitive movements to incr ease with activities   Person(s) Educated Patient   Methods Handout;Explanation;Verbal cues;Demonstration   Comprehension Verbalized understanding;Returned demonstration  min v.c. for incr movement amplitude          OT Short Term Goals - 02/18/16 1305      OT SHORT TERM GOAL #1   Title I with PD  specific HEP.   Time 4   Period Weeks   Status New     OT SHORT TERM GOAL #2   Title I with adapted strategies for ADLs/ IADLs.   Time 4   Period Weeks   Status New     OT SHORT TERM  GOAL #3   Title Pt will demonstrate ability to write a sentence with 95% legibility, good letter size with only minimal decrease in letter size.   Time 4   Period Weeks   Status New     OT SHORT TERM GOAL #4   Title Pt will demonstrate improved RUE functional use as evidenced by increasing box/ blocks score to 43 blocks or greater.   Baseline RUE 39 blocks, LUE 48 blocks   Time 4   Period Weeks   Status New     OT SHORT TERM GOAL #5   Title Pt will increase bilateral standing functional reach to 10 inches or greater bilaterally for decreased fall risk   Baseline RUE 8.5, LUE 9 inches   Time 4   Period Weeks   Status New           OT Long Term Goals - 02/10/16 1242      OT LONG TERM GOAL #1   Title Pt will verbalize understanding of community resources and ways to prevent future PD related  complications.   Time 8   Period Weeks   Status New     OT LONG TERM GOAL #2   Title Pt will demonstrate improved fine motor coordination as evidenced by decreasing RUE 9 hole peg test to 35 secs or less.   Baseline RUE 39.81 secs, LUE 30.72 secs   Time 8   Period Weeks   Status New     OT LONG TERM GOAL #3   Title Pt will demonstrate improved ability to fasten buttons as evidenced by decreasing 3 button/ unbutton test to 45 secs or less.   Time 8   Period Weeks   Status New     OT LONG TERM GOAL #4   Title Pt will verbalize understanding of ways to keep thinking skills sharp and ways to compensate for memory changes PRN..   Time 8   Period Weeks   Status New     OT LONG TERM GOAL #5   Title Pt will report UE pain no greater than 3/10 for ADLs/IADLs.   Time 8   Period Weeks   Status New               Plan - 03/08/16 1029    Clinical Impression Statement Pt is progressing towards goals.  Pt demo good understanding of HEP and used large amplitude movements with min v.c.   Rehab Potential Good   OT Frequency 2x / week   OT Duration 8 weeks   OT  Treatment/Interventions Self-care/ADL training;Moist Heat;Fluidtherapy;DME and/or AE instruction;Splinting;Patient/family education;Balance training;Therapeutic exercises;Gait Training;Contrast Bath;Ultrasound;Therapeutic exercise;Therapeutic activities;Cognitive remediation/compensation;Passive range of motion;Functional Mobility Training;Neuromuscular education;Cryotherapy;Electrical Stimulation;Parrafin;Energy conservation;Manual Therapy;Visual/perceptual remediation/compensation  Plan review supine PWR!, emphasize large amplitude movements with ADLs/functional activities; begin checking STGs   OT Home Exercise Plan issued PWR! seated quadraped and coordination HEP, ways to prevent future PD related complications, big movements with ADLs handout   Consulted and Agree with Plan of Care Patient;Family member/caregiver      Patient will benefit from skilled therapeutic intervention in order to improve the following deficits and impairments:  Abnormal gait, Decreased range of motion, Difficulty walking, Impaired flexibility, Decreased safety awareness, Decreased endurance, Decreased activity tolerance, Decreased knowledge of precautions, Impaired tone, Impaired UE functional use, Pain, Decreased balance, Decreased cognition, Decreased mobility, Decreased strength  Visit Diagnosis: Other symptoms and signs involving the nervous system  Other abnormalities of gait and mobility  Other lack of coordination  Other symptoms and signs involving the musculoskeletal system  Unsteadiness on feet  Abnormal posture    Problem List Patient Active Problem List   Diagnosis Date Noted  . Parkinson disease ([place]) 01/04/2016  . Seasonal and perennial allergic rhinitis 06/21/2014  . Asthma, mild intermittent, well-controlled 06/21/2014  . Pseudomonas aeruginosa colonization 10/08/2012  . Hemorrhage of rectum and anus 07/01/2012  . CAD (coronary artery disease) 11/15/2011  . Hypertension 11/15/2011  .  Hyperlipidemia 11/15/2011  . Obesity 11/15/2011  . Ureteral calculus, right 06/12/2011    [facility] 03/08/2016, 10:34 AM  [place] [address] [place], [place], [address] Phone: [phone]   Fax:  [phone]  Name: Todd Rice MRN: [id number] Date of Birth: 04/30/1956   Vianne Bulls, OTR/L [name] [address] [place], [place]  [address] [phone] phone [phone] 03/08/16 10:35 AM

## 2016-03-10 ENCOUNTER — Ambulatory Visit: Payer: 59 | Admitting: Physical Therapy

## 2016-03-10 ENCOUNTER — Ambulatory Visit: Payer: 59 | Admitting: Occupational Therapy

## 2016-03-10 ENCOUNTER — Ambulatory Visit: Payer: 59

## 2016-03-10 DIAGNOSIS — R2689 Other abnormalities of gait and mobility: Secondary | ICD-10-CM | POA: Diagnosis not present

## 2016-03-10 DIAGNOSIS — R29898 Other symptoms and signs involving the musculoskeletal system: Secondary | ICD-10-CM

## 2016-03-10 DIAGNOSIS — R29818 Other symptoms and signs involving the nervous system: Secondary | ICD-10-CM

## 2016-03-10 DIAGNOSIS — R471 Dysarthria and anarthria: Secondary | ICD-10-CM | POA: Diagnosis not present

## 2016-03-10 DIAGNOSIS — R293 Abnormal posture: Secondary | ICD-10-CM | POA: Diagnosis not present

## 2016-03-10 DIAGNOSIS — R278 Other lack of coordination: Secondary | ICD-10-CM

## 2016-03-10 DIAGNOSIS — R2681 Unsteadiness on feet: Secondary | ICD-10-CM | POA: Diagnosis not present

## 2016-03-10 DIAGNOSIS — N3020 Other chronic cystitis without hematuria: Secondary | ICD-10-CM | POA: Diagnosis not present

## 2016-03-10 NOTE — Therapy (Signed)
[facility] [address] [place], [place], [address] Phone: [phone]   Fax:  [phone]  Speech Language Pathology Treatment  Patient Details  Name: Todd Rice MRN: [id number] Date of Birth: 1955/08/02 Referring Provider: Neldon Newport MD  Encounter Date: 03/10/2016      End of Session - 03/10/16 0930    Visit Number 2   Number of Visits 16   Date for SLP Re-Evaluation 05/05/16   SLP Start Time 0850   SLP Stop Time  0930   SLP Time Calculation (min) 40 min   Activity Tolerance Patient tolerated treatment well      Past Medical History:  Diagnosis Date  . Adult BMI 50.0-59.9 kg/sq m    52.77  . Anal fissure   . Anxiety   . Diabetes mellitus without complication (HCC)    borderline  . GERD (gastroesophageal reflux disease)    30 years ago, maybe from peptic ulcer  . H/O: gout    STABLE  . Hepatic steatosis 10/01/11  . History of kidney stones   . Hyperlipidemia   . Hypertension   . Kidney tumor    right upper kidney tumor, monitoring  . OSA on CPAP   . Parkinson disease ([place]) 01/04/2016  . Right ureteral stone   . Vitamin D deficiency   . Weakness     Past Surgical History:  Procedure Laterality Date  . ANAL FISSURE REPAIR  10-12-2000  . COLONOSCOPY N/A 07/01/2012   Procedure: COLONOSCOPY;  Surgeon: Lafayette Dragon, MD;  [facility];  Service: Endoscopy;  Laterality: N/A;  . CYSTOSCOPY WITH RETROGRADE PYELOGRAM, URETEROSCOPY AND STENT PLACEMENT Bilateral 02/26/2013   Procedure: CYSTOSCOPY WITH RETROGRADE PYELOGRAM, URETEROSCOPY AND STENT PLACEMENT;  Surgeon: Alexis Frock, MD;  [facility];  Service: Urology;  Laterality: Bilateral;  . HOLMIUM LASER APPLICATION Bilateral [id number]   Procedure: HOLMIUM LASER APPLICATION;  Surgeon: Alexis Frock, MD;  [facility];  Service: Urology;  Laterality: Bilateral;  . kidney stone removal     06/2011-stent placement  . LEFT MEDIAL  FEMORAL CONDYLE DEBRIDEMENT & DRILLING/ REMOVAL LOOSE BODY  09-15-2003  . NASAL SEPTUM SURGERY  1985  . STONE EXTRACTION WITH BASKET  06/12/2011   Procedure: STONE EXTRACTION WITH BASKET;  Surgeon: Claybon Jabs, MD;  Location[facility];  Service: Urology;  Laterality: Right;    There were no vitals filed for this visit.      Subjective Assessment - 03/10/16 0857    Subjective "I brought the boss today."   Patient is accompained by: Family member  wife               ADULT SLP TREATMENT - 03/10/16 0857      General Information   Behavior/Cognition Alert;Cooperative;Pleasant mood     Treatment Provided   Treatment provided Cognitive-Linquistic     Pain Assessment   Pain Assessment No/denies pain     Cognitive-Linquistic Treatment   Treatment focused on Dysarthria   Skilled Treatment SLP trained pt with loud /a/, with explanation of rationale for loud /a/. SLP shaped loud "hey" to loud /a/ and pt produced with average 98dB - effort level 8. When pt had effort level 8.5-9, vocal strain noted. Pt read 7 sentence selections with average 73dB, and answered questions re: selection with rare min A and average 71dB.      Assessment / Recommendations / Plan   Plan Continue with current plan of care  Progression Toward Goals   Progression toward goals Progressing toward goals          SLP Education - 03/10/16 0930    Education provided Yes   Education Details rationale for loud /a/, shaping loud /a/   Person(s) Educated Patient;Spouse   Methods Explanation;Demonstration;Verbal cues   Comprehension Verbalized understanding;Returned demonstration;Verbal cues required;Need further instruction          SLP Short Term Goals - 03/10/16 0932      SLP SHORT TERM GOAL #1   Title pt will maintain loud /a/ average 94dB with appropriate voicing (<15% strained/strangled voice) over 4 sessions   Time 4   Period Weeks   Status On-going     SLP SHORT TERM  GOAL #2   Title pt will produce 19/20 sentences with average 70dB over two sessions   Time 4   Period Weeks   Status On-going     SLP SHORT TERM GOAL #3   Title pt will demo simple conversation of 8 minutes with average 70dB over three sessions   Time 4   Period Weeks   Status On-going     SLP SHORT TERM GOAL #4   Title pt will exhibit loud "Hey!" with average 95dB over 3 sessions   Time 4   Period Weeks   Status On-going          SLP Long Term Goals - 03/10/16 0932      SLP LONG TERM GOAL #1   Title pt will maintain loud /a/ at 95dB with proper voicing over 5 sessions   Time 8   Period Weeks   Status On-going     SLP LONG TERM GOAL #2   Title pt will demo 10 minutes mod complex conversastion with average 70dB over three sessions   Time 8   Period Weeks   Status On-going          Plan - 03/10/16 0931    Clinical Impression Statement Pt presents today with speech volume cont'd lower than WNL. Pt is producing loud /a/ witout vocal strain now since SLP shaping.  He would cont to beneift from skilled ST to incr loudness and thus incr QOL.   Speech Therapy Frequency 2x / week   Duration --  8 weeks or 16 visits total   Treatment/Interventions Compensatory strategies;Patient/family education;Functional tasks;Cueing hierarchy;SLP instruction and feedback;Internal/external aids  HEP   Potential to Achieve Goals Good   Consulted and Agree with Plan of Care Patient      Patient will benefit from skilled therapeutic intervention in order to improve the following deficits and impairments:   Dysarthria and anarthria    Problem List Patient Active Problem List   Diagnosis Date Noted  . Parkinson disease ([place]) 01/04/2016  . Seasonal and perennial allergic rhinitis 06/21/2014  . Asthma, mild intermittent, well-controlled 06/21/2014  . Pseudomonas aeruginosa colonization 10/08/2012  . Hemorrhage of rectum and anus 07/01/2012  . CAD (coronary artery disease) 11/15/2011   . Hypertension 11/15/2011  . Hyperlipidemia 11/15/2011  . Obesity 11/15/2011  . Ureteral calculus, right 06/12/2011    [facility] ,MS, CCC-SLP  03/10/2016, 9:33 AM  [facility] [address] [place], [place], [address] Phone: [phone]   Fax:  [phone]   Name: VIDA EARLYWINE MRN: [id number] Date of Birth: [birth year]

## 2016-03-10 NOTE — Patient Instructions (Signed)
  Please complete the assigned speech therapy homework and return it to your next session.  

## 2016-03-10 NOTE — Therapy (Deleted)
[facility] [address] [place], [place], [address] Phone: [phone]   Fax:  [phone]  Patient Details  Name: Todd Rice MRN: [id number] Date of Birth: [birth year] Referring Provider:  Unk Pinto, MD  Encounter Date: 03/10/2016   Theone Murdoch 03/10/2016, 10:56 AM  [facility] [address] [place], [place], [address] Phone: [phone]   Fax:  [phone]

## 2016-03-10 NOTE — Therapy (Signed)
[facility] [address] [place], [place], [address] Phone: [phone]   Fax:  [phone]  Physical Therapy Treatment  Patient Details  Name: Todd Rice MRN: [id number] Date of Birth: 12/16/1955 Referring Provider: [name]  Encounter Date: 03/10/2016      PT End of Session - 03/10/16 0846    Visit Number 4   Number of Visits 13   Date for PT Re-Evaluation 04/18/16   Authorization Type UMR   PT Start Time 0758   PT Stop Time 0841   PT Time Calculation (min) 43 min   Activity Tolerance Patient tolerated treatment well   Behavior During Therapy [facility] for tasks assessed/performed      Past Medical History:  Diagnosis Date  . Adult BMI 50.0-59.9 kg/sq m    52.77  . Anal fissure   . Anxiety   . Diabetes mellitus without complication (HCC)    borderline  . GERD (gastroesophageal reflux disease)    30 years ago, maybe from peptic ulcer  . H/O: gout    STABLE  . Hepatic steatosis 10/01/11  . History of kidney stones   . Hyperlipidemia   . Hypertension   . Kidney tumor    right upper kidney tumor, monitoring  . OSA on CPAP   . Parkinson disease ([place]) 01/04/2016  . Right ureteral stone   . Vitamin D deficiency   . Weakness     Past Surgical History:  Procedure Laterality Date  . ANAL FISSURE REPAIR  10-12-2000  . COLONOSCOPY N/A 07/01/2012   Procedure: COLONOSCOPY;  Surgeon: Lafayette Dragon, MD;  [facility];  Service: Endoscopy;  Laterality: N/A;  . CYSTOSCOPY WITH RETROGRADE PYELOGRAM, URETEROSCOPY AND STENT PLACEMENT Bilateral 02/26/2013   Procedure: CYSTOSCOPY WITH RETROGRADE PYELOGRAM, URETEROSCOPY AND STENT PLACEMENT;  Surgeon: Alexis Frock, MD;  [facility];  Service: Urology;  Laterality: Bilateral;  . HOLMIUM LASER APPLICATION Bilateral [id number]   Procedure: HOLMIUM LASER APPLICATION;  Surgeon: Alexis Frock, MD;  [facility];  Service: Urology;  Laterality: Bilateral;  .  kidney stone removal     06/2011-stent placement  . LEFT MEDIAL FEMORAL CONDYLE DEBRIDEMENT & DRILLING/ REMOVAL LOOSE BODY  09-15-2003  . NASAL SEPTUM SURGERY  1985  . STONE EXTRACTION WITH BASKET  06/12/2011   Procedure: STONE EXTRACTION WITH BASKET;  Surgeon: Claybon Jabs, MD;  Location: [facility];  Service: Urology;  Laterality: Right;    There were no vitals filed for this visit.      Subjective Assessment - 03/10/16 0759    Subjective Just feel blah today.   Patient Stated Goals Pt's goal for physical therapy to is regain normal use of R arm and keep from falling.   Currently in Pain? No/denies                         [facility] Adult PT Treatment/Exercise - 03/10/16 0001      Ambulation/Gait   Ambulation/Gait Yes   Ambulation/Gait Assistance 5: Supervision   Ambulation Distance (Feet) 1500 Feet   Assistive device None;Other (Comment)  bilateral walking poles   Gait Pattern Step-through pattern;Decreased arm swing - right;Decreased step length - right;Decreased stance time - right;Decreased trunk rotation;Poor foot clearance - right;Poor foot clearance - left   Ambulation Surface Level;Indoor;Unlevel;Outdoor   Gait Comments Worked on coordination and sequencing of bilateral walking poles.  Cues for initiating reciprocal arm swing with dragging poles then adding intensity by  pushing through poles.  Pt has multiple episodes on outdoor surfaces of getting of sequence to ipsalateral arm swing, needing cues to reset.  Discussed how to obtain walking poles for home use and benefits of adding walking poles to current walking program.     High Level Balance   High Level Balance Activities Backward walking  Forward/back walking with coordinated arm swing   High Level Balance Comments Lunge and hold alternating LE's x 40'; large step and stop with coordinated arms x 40', Marching alternating arms/legs raising arms x 40' x 2           [employer]) -  03/10/16 0800    PWR! exercises Moves in standing   PWR! Up x 20   PWR! Rock x 20   PWR! Twist x 20    PWR Step x 20 side, x 20 forward, x 20 backward without UE support   Basic 4 Flow x 5 reps of standing FLOW with visual and verbal cues.   Comments Reviewed PWR! Moves in standing- pt return demo understanding with min cues for technique     Performed PWR! Up for posture, PWR! Rock for [employer], [place]! Twist for trunk flexibility, PWR! Step for initiation of stepping/balance strategy, with occasional cues for large amplitude movement and deliberate patterns.        PT Education - 03/10/16 0845    Education provided Yes   Education Details Additions to HEP-see instructions; gait with walking poles/benefits of and how to obtain walking poles for further home use   Person(s) Educated Patient;Spouse   Methods Explanation;Demonstration;Verbal cues;Handout   Comprehension Verbalized understanding;Returned demonstration;Verbal cues required          PT Short Term Goals - 02/18/16 1243      PT SHORT TERM GOAL #1   Title Pt will be independent with HEP for improved balance, transfers, gait.  TARGET 03/19/16   Time 4   Period Weeks   Status New     PT SHORT TERM GOAL #2   Title Pt will verbalize understanding of local community Parkinson's related resources.   Time 4   Period Weeks   Status New     PT SHORT TERM GOAL #3   Title Pt will improve 5x sit<>stand to less than or equal to 11.5 seconds for improved efficiency and safety with gait.   Time 4   Period Weeks   Status New           PT Long Term Goals - 02/18/16 1245      PT LONG TERM GOAL #1   Title Pt will verbalize understanding of fall prevention in home environment.  TARGET 04/18/16   Time 8   Period Weeks   Status New     PT LONG TERM GOAL #2   Title Pt will perform at least 8 of 10 reps of sit<>stand transfers from lower than 18" surfaces, independently, for improved ease of transfers.   Time 8    Period Weeks   Status New     PT LONG TERM GOAL #3   Title Pt will improve TUG and TUG cognitive to <10% difference in times, for improved dual tasking with gait.   Time 8   Period Weeks   Status New     PT LONG TERM GOAL #4   Title Pt will improve 4-square step test to less than or equal to 11 seconds with no LOB, for improved balance with change of directions.   Time 8  Period Weeks   Status New     PT LONG TERM GOAL #5   Title Pt will verbalize plans for continued community fitness upon D/C from PT.   Time 8   Period Weeks   Status New               Plan - 03/10/16 1143    Clinical Impression Statement Focused on standing PWR! Moves review, FLOW for additional cognitive/balance component, forward and back stepping for step strategy.  Pt continues to benefit from potential use of walking poles as addition to walking as exercise at home, but pt continues to have difficulty with sequencing reciprocal pattern with walking poles.  Pt will continue to benefit from further skilled PT to address posture, balance and gait.   Rehab Potential Good   PT Frequency 2x / week  1x/wk for 1 wk, then   PT Duration 8 weeks   PT Treatment/Interventions ADLs/Self Care Home Management;Functional mobility training;Gait training;Therapeutic activities;Therapeutic exercise;Balance training;Neuromuscular re-education;Patient/family education   PT Next Visit Plan PWR! Moves FLOW in standing; sit<>stand followed by PWR! Moves in standing; multi-direction/varied direction stepping; gait with walking poles   Consulted and Agree with Plan of Care Patient;Family member/caregiver   Family Member Consulted wife      Patient will benefit from skilled therapeutic intervention in order to improve the following deficits and impairments:  Abnormal gait, Decreased balance, Decreased mobility, Difficulty walking, Impaired flexibility, Postural dysfunction  Visit Diagnosis: Other symptoms and signs involving  the nervous system  Other abnormalities of gait and mobility     Problem List Patient Active Problem List   Diagnosis Date Noted  . Parkinson disease ([place]) 01/04/2016  . Seasonal and perennial allergic rhinitis 06/21/2014  . Asthma, mild intermittent, well-controlled 06/21/2014  . Pseudomonas aeruginosa colonization 10/08/2012  . Hemorrhage of rectum and anus 07/01/2012  . CAD (coronary artery disease) 11/15/2011  . Hypertension 11/15/2011  . Hyperlipidemia 11/15/2011  . Obesity 11/15/2011  . Ureteral calculus, right 06/12/2011    Ellar Hakala W. 03/10/2016, 11:47 AM  Frazier Butt., PT  [facility] [address] [place], [place], [address] Phone: [phone]   Fax:  [phone]  Name: SAY FRIPP MRN: [id number] Date of Birth: Sep 07, 1955

## 2016-03-10 NOTE — Patient Instructions (Signed)
Added standing forward step and weigthshift x 20 reps  Back step and weightshift x 20 reps

## 2016-03-10 NOTE — Therapy (Signed)
[facility] [address] [place], [place], [address] Phone: [phone]   Fax:  [phone]  Occupational Therapy Treatment  Patient Details  Name: Todd Rice MRN: [id number] Date of Birth: 1955-08-30 Referring Provider: Dr. [name]  Encounter Date: 03/10/2016      OT End of Session - 03/10/16 1048    Visit Number 6   Number of Visits 17   Date for OT Re-Evaluation 04/08/16   Authorization Type cone UMR   OT Start Time 1010   OT Stop Time 1100   OT Time Calculation (min) 50 min   Activity Tolerance Patient tolerated treatment well   Behavior During Therapy [facility] for tasks assessed/performed      Past Medical History:  Diagnosis Date  . Adult BMI 50.0-59.9 kg/sq m    52.77  . Anal fissure   . Anxiety   . Diabetes mellitus without complication (HCC)    borderline  . GERD (gastroesophageal reflux disease)    30 years ago, maybe from peptic ulcer  . H/O: gout    STABLE  . Hepatic steatosis 10/01/11  . History of kidney stones   . Hyperlipidemia   . Hypertension   . Kidney tumor    right upper kidney tumor, monitoring  . OSA on CPAP   . Parkinson disease ([place]) 01/04/2016  . Right ureteral stone   . Vitamin D deficiency   . Weakness     Past Surgical History:  Procedure Laterality Date  . ANAL FISSURE REPAIR  10-12-2000  . COLONOSCOPY N/A 07/01/2012   Procedure: COLONOSCOPY;  Surgeon: Lafayette Dragon, MD;  Location: [facility];  Service: Endoscopy;  Laterality: N/A;  . CYSTOSCOPY WITH RETROGRADE PYELOGRAM, URETEROSCOPY AND STENT PLACEMENT Bilateral 02/26/2013   Procedure: CYSTOSCOPY WITH RETROGRADE PYELOGRAM, URETEROSCOPY AND STENT PLACEMENT;  Surgeon: Alexis Frock, MD;  Location: [facility];  Service: Urology;  Laterality: Bilateral;  . HOLMIUM LASER APPLICATION Bilateral [id number]   Procedure: HOLMIUM LASER APPLICATION;  Surgeon: Alexis Frock, MD;  Location: [facility];  Service: Urology;  Laterality:  Bilateral;  . kidney stone removal     06/2011-stent placement  . LEFT MEDIAL FEMORAL CONDYLE DEBRIDEMENT & DRILLING/ REMOVAL LOOSE BODY  09-15-2003  . NASAL SEPTUM SURGERY  1985  . STONE EXTRACTION WITH BASKET  06/12/2011   Procedure: STONE EXTRACTION WITH BASKET;  Surgeon: Claybon Jabs, MD;  [facility];  Service: Urology;  Laterality: Right;    There were no vitals filed for this visit.      Subjective Assessment - 03/10/16 1018    Patient Stated Goals improve function in right hand   Currently in Pain? No/denies        Treatment: discussed with pt the NPF recommendation that pt's with PD see a movement disorders specialist at least 1x. Pt asked who the movement d/o specialist's in the area are and he was provided with Dr. Doristine Devoid info. Fine motor coordination task, placing grooved pegs in pegboard with RUE, min difficulty then removing with PWR! step, min v.c. Dynamic step and reach to toss scarves to target with each UE, min v.c/ demonstration for large amplitude movements and gathering scarf in hand with proper technique. Ambulating while tossing a scarf between hands with big movements Arm bike x 5 mins level 1 for conditioning, pt maintained 40RPm handwriting task, pt demonstrates ability to write a sentence legibily with good letter size and only min decrease in letter size.  OT Short Term Goals - 03/10/16 1043      OT SHORT TERM GOAL #1   Title I with PD  specific HEP.   Time 4   Period Weeks   Status On-going     OT SHORT TERM GOAL #2   Title I with adapted strategies for ADLs/ IADLs.   Time 4   Period Weeks   Status On-going     OT SHORT TERM GOAL #3   Title Pt will demonstrate ability to write a sentence with 95% legibility, good letter size with only minimal decrease in letter size.   Time 4   Period Weeks   Status Achieved     OT SHORT TERM GOAL #4   Title Pt will demonstrate improved RUE  functional use as evidenced by increasing box/ blocks score to 43 blocks or greater.   Baseline RUE 39 blocks, LUE 48 blocks   Time 4   Period Weeks   Status On-going     OT SHORT TERM GOAL #5   Title Pt will increase bilateral standing functional reach to 10 inches or greater bilaterally for decreased fall risk   Baseline RUE 8.5, LUE 9 inches   Time 4   Period Weeks   Status On-going           OT Long Term Goals - 02/10/16 1242      OT LONG TERM GOAL #1   Title Pt will verbalize understanding of community resources and ways to prevent future PD related  complications.   Time 8   Period Weeks   Status New     OT LONG TERM GOAL #2   Title Pt will demonstrate improved fine motor coordination as evidenced by decreasing RUE 9 hole peg test to 35 secs or less.   Baseline RUE 39.81 secs, LUE 30.72 secs   Time 8   Period Weeks   Status New     OT LONG TERM GOAL #3   Title Pt will demonstrate improved ability to fasten buttons as evidenced by decreasing 3 button/ unbutton test to 45 secs or less.   Time 8   Period Weeks   Status New     OT LONG TERM GOAL #4   Title Pt will verbalize understanding of ways to keep thinking skills sharp and ways to compensate for memory changes PRN..   Time 8   Period Weeks   Status New     OT LONG TERM GOAL #5   Title Pt will report UE pain no greater than 3/10 for ADLs/IADLs.   Time 8   Period Weeks   Status New               Plan - 03/10/16 1020    Clinical Impression Statement Pt is progressing towards goals. He demonstrates good carryover of strategies for ADLS and large amplitude movements with functional activity.   Rehab Potential Good   OT Frequency 2x / week   OT Duration 8 weeks   OT Treatment/Interventions Self-care/ADL training;Moist Heat;Fluidtherapy;DME and/or AE instruction;Splinting;Patient/family education;Balance training;Therapeutic exercises;Gait Training;Contrast Bath;Ultrasound;Therapeutic  exercise;Therapeutic activities;Cognitive remediation/compensation;Passive range of motion;Functional Mobility Training;Neuromuscular education;Cryotherapy;Electrical Stimulation;Parrafin;Energy conservation;Manual Therapy;Visual/perceptual remediation/compensation   Plan review supine PWR! , begin checking short term goals   Consulted and Agree with Plan of Care Patient      Patient will benefit from skilled therapeutic intervention in order to improve the following deficits and impairments:  Abnormal gait, Decreased range of motion, Difficulty walking, Impaired flexibility, Decreased safety awareness, Decreased  endurance, Decreased activity tolerance, Decreased knowledge of precautions, Impaired tone, Impaired UE functional use, Pain, Decreased balance, Decreased cognition, Decreased mobility, Decreased strength  Visit Diagnosis: Other symptoms and signs involving the nervous system  Other abnormalities of gait and mobility  Other lack of coordination  Other symptoms and signs involving the musculoskeletal system    Problem List Patient Active Problem List   Diagnosis Date Noted  . Parkinson disease ([place]) 01/04/2016  . Seasonal and perennial allergic rhinitis 06/21/2014  . Asthma, mild intermittent, well-controlled 06/21/2014  . Pseudomonas aeruginosa colonization 10/08/2012  . Hemorrhage of rectum and anus 07/01/2012  . CAD (coronary artery disease) 11/15/2011  . Hypertension 11/15/2011  . Hyperlipidemia 11/15/2011  . Obesity 11/15/2011  . Ureteral calculus, right 06/12/2011    [place] 03/10/2016, 12:30 PM  [facility] [address] [place], [place], [address] Phone: [phone]   Fax:  [phone]  Name: Todd Rice MRN: [id number] Date of Birth: [birth year]

## 2016-03-13 ENCOUNTER — Other Ambulatory Visit: Payer: Self-pay | Admitting: Internal Medicine

## 2016-03-13 MED FILL — PRAMIPEXOLE 0.125 MG TABLET: 0.125 | 30 days supply | Qty: 180 | Fill #2

## 2016-03-13 MED FILL — VALSARTAN-HCTZ 320-25 MG TA: 320-25 | 90 days supply | Qty: 90 | Fill #1

## 2016-03-14 MED FILL — ALPRAZolam 0.5 MG TABS: 0.5 | 30 days supply | Qty: 90 | Fill #0

## 2016-03-15 ENCOUNTER — Ambulatory Visit: Payer: 59 | Admitting: Occupational Therapy

## 2016-03-15 ENCOUNTER — Ambulatory Visit: Payer: 59

## 2016-03-15 DIAGNOSIS — R471 Dysarthria and anarthria: Secondary | ICD-10-CM | POA: Diagnosis not present

## 2016-03-15 DIAGNOSIS — R2681 Unsteadiness on feet: Secondary | ICD-10-CM | POA: Diagnosis not present

## 2016-03-15 DIAGNOSIS — R278 Other lack of coordination: Secondary | ICD-10-CM | POA: Diagnosis not present

## 2016-03-15 DIAGNOSIS — R29818 Other symptoms and signs involving the nervous system: Secondary | ICD-10-CM

## 2016-03-15 DIAGNOSIS — R293 Abnormal posture: Secondary | ICD-10-CM | POA: Diagnosis not present

## 2016-03-15 DIAGNOSIS — N3020 Other chronic cystitis without hematuria: Secondary | ICD-10-CM | POA: Diagnosis not present

## 2016-03-15 DIAGNOSIS — R29898 Other symptoms and signs involving the musculoskeletal system: Secondary | ICD-10-CM

## 2016-03-15 DIAGNOSIS — R2689 Other abnormalities of gait and mobility: Secondary | ICD-10-CM | POA: Diagnosis not present

## 2016-03-15 NOTE — Patient Instructions (Signed)
Memory Compensation Strategies  1. Use "WARM" strategy.  W= write it down  A= associate it  R= repeat it  M= make a mental note  2.   You can keep a Memory Notebook.  Use a 3-ring notebook with sections for the following: calendar, important names and phone numbers,  medications, doctors' names/phone numbers, lists/reminders, and a section to journal what you did  each day.   3.    Use a calendar to write appointments down.  4.    Write yourself a schedule for the day.  This can be placed on the calendar or in a separate section of the Memory Notebook.  Keeping a  regular schedule can help memory.  5.    Use medication organizer with sections for each day or morning/evening pills.  You may need help loading it  6.    Keep a basket, or pegboard by the door.  Place items that you need to take out with you in the basket or on the pegboard.  You may also want to  include a message board for reminders.  7.    Use sticky notes.  Place sticky notes with reminders in a place where the task is performed.  For example: " turn off the  stove" placed by the stove, "lock the door" placed on the door at eye level, " take your medications" on  the bathroom mirror or by the place where you normally take your medications.  8.    Use alarms/timers.  Use while cooking to remind yourself to check on food or as a reminder to take your medicine, or as a  reminder to make a call, or as a reminder to perform another task, etc.    Keeping Thinking Skills Sharp: 1. Jigsaw puzzles 2. Card/board games 3. Talking on the phone/social events 4. Lumosity.com 5. Online games 6. Word serches/crossword puzzles 7.  Logic puzzles 8. Aerobic exercise (stationary bike) 9. Eating balanced diet (fruits & veggies) 10. Drink water 11. Try something new--new recipe, hobby 12. Crafts 13. Do a variety of activities that are challenging 14. Add cognitive activities to walking/exercising (think of animal/food/city with  each letter of the alphabet, counting backwards, thinking of as many vegetables as you can, etc.).--Only do this  If safe (no freezing/falls).   

## 2016-03-15 NOTE — Therapy (Signed)
[facility] [address] [place], [place], [address] Phone: [phone]   Fax:  [phone]  Occupational Therapy Treatment  Patient Details  Name: Todd Rice MRN: [id number] Date of Birth: 10-19-1955 Referring Provider: Dr. [name]  Encounter Date: 03/15/2016      OT End of Session - 03/15/16 1704    Visit Number 7   Number of Visits 17   Date for OT Re-Evaluation 04/08/16   Authorization Type cone UMR   OT Start Time 1451   OT Stop Time 1538   OT Time Calculation (min) 47 min   Activity Tolerance Patient tolerated treatment well   Behavior During Therapy [facility] for tasks assessed/performed      Past Medical History:  Diagnosis Date  . Adult BMI 50.0-59.9 kg/sq m    52.77  . Anal fissure   . Anxiety   . Diabetes mellitus without complication (HCC)    borderline  . GERD (gastroesophageal reflux disease)    30 years ago, maybe from peptic ulcer  . H/O: gout    STABLE  . Hepatic steatosis 10/01/11  . History of kidney stones   . Hyperlipidemia   . Hypertension   . Kidney tumor    right upper kidney tumor, monitoring  . OSA on CPAP   . Parkinson disease ([place]) 01/04/2016  . Right ureteral stone   . Vitamin D deficiency   . Weakness     Past Surgical History:  Procedure Laterality Date  . ANAL FISSURE REPAIR  10-12-2000  . COLONOSCOPY N/A 07/01/2012   Procedure: COLONOSCOPY;  Surgeon: Lafayette Dragon, MD;  [facility];  Service: Endoscopy;  Laterality: N/A;  . CYSTOSCOPY WITH RETROGRADE PYELOGRAM, URETEROSCOPY AND STENT PLACEMENT Bilateral 02/26/2013   Procedure: CYSTOSCOPY WITH RETROGRADE PYELOGRAM, URETEROSCOPY AND STENT PLACEMENT;  Surgeon: Alexis Frock, MD;  [facility];  Service: Urology;  Laterality: Bilateral;  . HOLMIUM LASER APPLICATION Bilateral [id number]   Procedure: HOLMIUM LASER APPLICATION;  Surgeon: Alexis Frock, MD;  Location: [facility];  Service: Urology;  Laterality:  Bilateral;  . kidney stone removal     06/2011-stent placement  . LEFT MEDIAL FEMORAL CONDYLE DEBRIDEMENT & DRILLING/ REMOVAL LOOSE BODY  09-15-2003  . NASAL SEPTUM SURGERY  1985  . STONE EXTRACTION WITH BASKET  06/12/2011   Procedure: STONE EXTRACTION WITH BASKET;  Surgeon: Claybon Jabs, MD;  Location: [facility];  Service: Urology;  Laterality: Right;    There were no vitals filed for this visit.      Subjective Assessment - 03/15/16 1706    Patient Stated Goals improve function in right hand   Currently in Pain? No/denies                         P[facility]) - 03/15/16 1659    Reaching Comments Dynamic step and reach to alternate sides with alternate UE's for cognitve component while copying small peg design on vertical surface, min v.c for large amplitude movments.   PWR! Up x10   PWR! Rock x10   PWR! Twist x10   PWR! Step x10   Comments min v.c.             OT Education - 03/15/16 1704    Education provided Yes   Education Details ways to keep thinking skills sharp, memory compensations   Person(s) Educated Patient;Spouse   Methods Explanation;Verbal cues;Handout   Comprehension Verbalized understanding  OT Short Term Goals - 03/15/16 1452      OT SHORT TERM GOAL #1   Title I with PD  specific HEP.   Time 4   Period Weeks   Status Achieved     OT SHORT TERM GOAL #2   Title I with adapted strategies for ADLs/ IADLs.   Time 4   Period Weeks   Status On-going     OT SHORT TERM GOAL #3   Title Pt will demonstrate ability to write a sentence with 95% legibility, good letter size with only minimal decrease in letter size.   Time 4   Status Achieved     OT SHORT TERM GOAL #4   Title Pt will demonstrate improved RUE functional use as evidenced by increasing box/ blocks score to 43 blocks or greater.   Status On-going     OT SHORT TERM GOAL #5   Title Pt will increase bilateral standing functional reach to 10  inches or greater bilaterally for decreased fall risk   Time 4   Status On-going           OT Long Term Goals - 03/15/16 1704      OT LONG TERM GOAL #1   Title Pt will verbalize understanding of community resources and ways to prevent future PD related  complications.   Time 8   Period Weeks   Status New     OT LONG TERM GOAL #2   Title Pt will demonstrate improved fine motor coordination as evidenced by decreasing RUE 9 hole peg test to 35 secs or less.   Baseline RUE 39.81 secs, LUE 30.72 secs   Time 8   Period Weeks   Status New     OT LONG TERM GOAL #3   Title Pt will demonstrate improved ability to fasten buttons as evidenced by decreasing 3 button/ unbutton test to 45 secs or less.   Time 8   Period Weeks   Status New     OT LONG TERM GOAL #4   Title Pt will verbalize understanding of ways to keep thinking skills sharp and ways to compensate for memory changes PRN..   Time 8   Period Weeks   Status Achieved     OT LONG TERM GOAL #5   Title Pt will report UE pain no greater than 3/10 for ADLs/IADLs.   Time 8   Period Weeks   Status New               Plan - 03/15/16 1702    Clinical Impression Statement Pt is progressing towards goals. He demonstrates good carryover of HEP.   Rehab Potential Good   OT Frequency 2x / week   OT Duration 8 weeks   OT Treatment/Interventions Self-care/ADL training;Moist Heat;Fluidtherapy;DME and/or AE instruction;Splinting;Patient/family education;Balance training;Therapeutic exercises;Gait Training;Contrast Bath;Ultrasound;Therapeutic exercise;Therapeutic activities;Cognitive remediation/compensation;Passive range of motion;Functional Mobility Training;Neuromuscular education;Cryotherapy;Electrical Stimulation;Parrafin;Energy conservation;Manual Therapy;Visual/perceptual remediation/compensation   Plan review PWR! supine, check STG's   OT Home Exercise Plan issued PWR! seated quadraped and coordination HEP, ways to prevent  future PD related complications, big movements with ADLs handout   Consulted and Agree with Plan of Care Patient;Family member/caregiver      Patient will benefit from skilled therapeutic intervention in order to improve the following deficits and impairments:  Abnormal gait, Decreased range of motion, Difficulty walking, Impaired flexibility, Decreased safety awareness, Decreased endurance, Decreased activity tolerance, Decreased knowledge of precautions, Impaired tone, Impaired UE functional use, Pain, Decreased balance, Decreased cognition, Decreased  mobility, Decreased strength  Visit Diagnosis: Other symptoms and signs involving the nervous system  Other abnormalities of gait and mobility  Other lack of coordination  Other symptoms and signs involving the musculoskeletal system  Unsteadiness on feet  Abnormal posture    Problem List Patient Active Problem List   Diagnosis Date Noted  . Parkinson disease ([place]) 01/04/2016  . Seasonal and perennial allergic rhinitis 06/21/2014  . Asthma, mild intermittent, well-controlled 06/21/2014  . Pseudomonas aeruginosa colonization 10/08/2012  . Hemorrhage of rectum and anus 07/01/2012  . CAD (coronary artery disease) 11/15/2011  . Hypertension 11/15/2011  . Hyperlipidemia 11/15/2011  . Obesity 11/15/2011  . Ureteral calculus, right 06/12/2011    RINE,KATHRYN 03/15/2016, 5:06 PM  [facility] [address] [place], [place], [address] Phone: [phone]   Fax:  [phone]  Name: Todd Rice MRN: [id number] Date of Birth: Aug 23, 1955

## 2016-03-15 NOTE — Telephone Encounter (Signed)
Pt called in and states he has not heard from Lilly yet. I suggested he call and gave pt the number.

## 2016-03-15 NOTE — Telephone Encounter (Signed)
I emailed [employer] and received this response:  "We just got off the phone with him.  I had called and Left Voicemails and he finally returned my call this morning.  I am verifying his benefits and will have him called back to schedule today"

## 2016-03-17 ENCOUNTER — Ambulatory Visit: Payer: 59

## 2016-03-17 ENCOUNTER — Ambulatory Visit: Payer: Self-pay | Admitting: Internal Medicine

## 2016-03-17 ENCOUNTER — Ambulatory Visit: Payer: 59 | Admitting: Occupational Therapy

## 2016-03-20 DIAGNOSIS — G4733 Obstructive sleep apnea (adult) (pediatric): Secondary | ICD-10-CM | POA: Diagnosis not present

## 2016-03-21 ENCOUNTER — Ambulatory Visit: Payer: 59 | Admitting: Physical Therapy

## 2016-03-21 ENCOUNTER — Encounter: Payer: 59 | Admitting: Occupational Therapy

## 2016-03-21 ENCOUNTER — Encounter: Payer: 59 | Admitting: *Deleted

## 2016-03-22 ENCOUNTER — Encounter: Payer: Self-pay | Admitting: Internal Medicine

## 2016-03-22 ENCOUNTER — Ambulatory Visit (INDEPENDENT_AMBULATORY_CARE_PROVIDER_SITE_OTHER): Payer: 59 | Admitting: Internal Medicine

## 2016-03-22 ENCOUNTER — Ambulatory Visit: Payer: Self-pay | Admitting: Internal Medicine

## 2016-03-22 VITALS — BP 136/80 | HR 80 | Temp 98.4°F | Resp 18 | Ht 67.0 in | Wt 311.0 lb

## 2016-03-22 DIAGNOSIS — R7309 Other abnormal glucose: Secondary | ICD-10-CM | POA: Diagnosis not present

## 2016-03-22 DIAGNOSIS — I2510 Atherosclerotic heart disease of native coronary artery without angina pectoris: Secondary | ICD-10-CM

## 2016-03-22 DIAGNOSIS — E782 Mixed hyperlipidemia: Secondary | ICD-10-CM

## 2016-03-22 DIAGNOSIS — I10 Essential (primary) hypertension: Secondary | ICD-10-CM

## 2016-03-22 DIAGNOSIS — Z79899 Other long term (current) drug therapy: Secondary | ICD-10-CM | POA: Diagnosis not present

## 2016-03-22 DIAGNOSIS — M109 Gout, unspecified: Secondary | ICD-10-CM

## 2016-03-22 DIAGNOSIS — G20 Parkinson's disease: Secondary | ICD-10-CM

## 2016-03-22 LAB — HEPATIC FUNCTION PANEL
ALBUMIN: 3.9 g/dL (ref 3.6–5.1)
ALK PHOS: 57 U/L (ref 40–115)
ALT: 33 U/L (ref 9–46)
AST: 29 U/L (ref 10–35)
BILIRUBIN INDIRECT: 0.3 mg/dL (ref 0.2–1.2)
BILIRUBIN TOTAL: 0.4 mg/dL (ref 0.2–1.2)
Bilirubin, Direct: 0.1 mg/dL (ref <=0.2)
TOTAL PROTEIN: 7.3 g/dL (ref 6.1–8.1)

## 2016-03-22 LAB — CBC WITH DIFFERENTIAL/PLATELET
BASOS PCT: 0 %
Basophils Absolute: 0 cells/uL (ref 0–200)
EOS ABS: 174 {cells}/uL (ref 15–500)
Eosinophils Relative: 2 %
HEMATOCRIT: 45.2 % (ref 38.5–50.0)
Hemoglobin: 15 g/dL (ref 13.2–17.1)
LYMPHS ABS: 2784 {cells}/uL (ref 850–3900)
Lymphocytes Relative: 32 %
MCH: 27.8 pg (ref 27.0–33.0)
MCHC: 33.2 g/dL (ref 32.0–36.0)
MCV: 83.7 fL (ref 80.0–100.0)
MONO ABS: 696 {cells}/uL (ref 200–950)
MPV: 10.6 fL (ref 7.5–12.5)
Monocytes Relative: 8 %
Neutro Abs: 5046 cells/uL (ref 1500–7800)
Neutrophils Relative %: 58 %
Platelets: 171 10*3/uL (ref 140–400)
RBC: 5.4 MIL/uL (ref 4.20–5.80)
RDW: 14 % (ref 11.0–15.0)
WBC: 8.7 10*3/uL (ref 3.8–10.8)

## 2016-03-22 LAB — BASIC METABOLIC PANEL WITH GFR
BUN: 25 mg/dL (ref 7–25)
CHLORIDE: 100 mmol/L (ref 98–110)
CO2: 27 mmol/L (ref 20–31)
Calcium: 10.3 mg/dL (ref 8.6–10.3)
Creat: 1.39 mg/dL — ABNORMAL HIGH (ref 0.70–1.25)
GFR, EST AFRICAN AMERICAN: 63 mL/min (ref >=60)
GFR, EST NON AFRICAN AMERICAN: 54 mL/min — AB (ref >=60)
GLUCOSE: 172 mg/dL — AB (ref 65–99)
POTASSIUM: 4 mmol/L (ref 3.5–5.3)
Sodium: 137 mmol/L (ref 135–146)

## 2016-03-22 LAB — LIPID PANEL
CHOL/HDL RATIO: 4.8 ratio (ref <5.0)
Cholesterol: 160 mg/dL (ref <200)
HDL: 33 mg/dL — AB (ref >40)
LDL CALC: 75 mg/dL (ref <100)
Triglycerides: 262 mg/dL — ABNORMAL HIGH (ref <150)
VLDL: 52 mg/dL — ABNORMAL HIGH (ref <30)

## 2016-03-22 LAB — TSH: TSH: 2.14 mIU/L (ref 0.40–4.50)

## 2016-03-22 MED ORDER — PREDNISONE 20 MG PO TABS: ORAL_TABLET | ORAL | 0 refills | Status: DC

## 2016-03-22 MED FILL — predniSONE 20 MG TABS: 20 | 15 days supply | Qty: 27 | Fill #0

## 2016-03-22 NOTE — Progress Notes (Signed)
Assessment and Plan:  Hypertension:  -Continue medication,  -monitor blood pressure at home.  -Continue DASH diet.   -Reminder to go to the ER if any CP, SOB, nausea, dizziness, severe HA, changes vision/speech, left arm numbness and tingling, and jaw pain.  Cholesterol: -Continue diet and exercise.  -Check cholesterol.   Diabetes: -recommend that if further worsening may need medication -try for weight loss for help with parkinsons -Continue diet and exercise.  -Check A1C  Vitamin D Def: -continue medications.   Parkinsons -cont medications -followed by neuro -currently in PT -some improvements  RCC -due to see urology in April -will likely eventually need nephrectomy  Gout flare -prednisone  Continue diet and meds as discussed. Further disposition pending results of labs.  HPI 60 y.o. male  presents for 3 month follow up with hypertension, hyperlipidemia, prediabetes and vitamin D.   His blood pressure has been controlled at home, today their BP is BP: 136/80.   He does not workout. He denies chest pain, shortness of breath, dizziness.   He is on cholesterol medication and denies myalgias. His cholesterol is at goal. The cholesterol last visit was:   Lab Results  Component Value Date   CHOL 139 12/17/2015   HDL 38 (L) 12/17/2015   LDLCALC 60 12/17/2015   TRIG 206 (H) 12/17/2015   CHOLHDL 3.7 12/17/2015     He has been working on diet and exercise for prediabetes, and denies foot ulcerations, hyperglycemia, hypoglycemia , increased appetite, nausea, paresthesia of the feet, polydipsia, polyuria, visual disturbances, vomiting and weight loss. Last A1C in the office was:  Lab Results  Component Value Date   HGBA1C 7.2 (H) 12/17/2015    Patient is on Vitamin D supplement.  Lab Results  Component Value Date   VD25OH 28 (L) 12/17/2015     Patient reports that he has been following with neurology and has been doing physical therapy for his parkinsons.  He  reports that his right hand has still been very difficult.  He reports that he has been taking the mirapex.    He reports that he does feel like his gout is flaring up a little bit.  He is using his allopurinol.  He reports that he finally got his CPAP machine and he has been using this.  He has been using it regularly.    He reports that he is due to see the urologist in April.  He reports that they are going to scan him again and determine whether he is going to need surgery.     Current Medications:  Current Outpatient Prescriptions on File Prior to Visit  Medication Sig Dispense Refill  . albuterol (PROVENTIL HFA;VENTOLIN HFA) 108 (90 BASE) MCG/ACT inhaler Take 1 to 2 inhalations 4 x daily or every 4 hours if need to rescue asthma 18 g 99  . allopurinol (ZYLOPRIM) 300 MG tablet TAKE 1 TABLET BY MOUTH DAILY. 90 tablet 2  . ALPRAZolam (XANAX) 0.5 MG tablet TAKE 1 TABLET BY MOUTH 3 TIMES DAILY 90 tablet 2  . aspirin EC 81 MG tablet Take 81 mg by mouth at bedtime.    Marland Kitchen COLCRYS 0.6 MG tablet Take 1 tablet (0.6 mg total) by mouth 2 (two) times daily. 180 tablet 12  . CRESTOR 10 MG tablet Take 1 tablet (10 mg total) by mouth at bedtime. 90 tablet 3  . EPINEPHrine (EPIPEN 2-PAK) 0.3 mg/0.3 mL IJ SOAJ injection Inject 0.3 mLs (0.3 mg total) into the muscle once. 2 Device  1  . metFORMIN (GLUCOPHAGE) 500 MG tablet TAKE 2 TABLETS BY MOUTH 2 TIMES DAILY. 360 tablet 1  . pramipexole (MIRAPEX) 0.125 MG tablet One tablet three times a day for 3 weeks, then take 2 tablets 3 times a day 180 tablet 2  . Probiotic Product (PROBIOTIC DAILY PO) Take 1 tablet by mouth daily.    . valsartan-hydrochlorothiazide (DIOVAN-HCT) 320-25 MG tablet TAKE 1 TABLET BY MOUTH DAILY. 90 tablet 2   No current facility-administered medications on file prior to visit.     Medical History:  Past Medical History:  Diagnosis Date  . Adult BMI 50.0-59.9 kg/sq m    52.77  . Anal fissure   . Anxiety   . Diabetes mellitus  without complication (HCC)    borderline  . GERD (gastroesophageal reflux disease)    30 years ago, maybe from peptic ulcer  . H/O: gout    STABLE  . Hepatic steatosis 10/01/11  . History of kidney stones   . Hyperlipidemia   . Hypertension   . Kidney tumor    right upper kidney tumor, monitoring  . OSA on CPAP   . Parkinson disease ([place]) 01/04/2016  . Right ureteral stone   . Vitamin D deficiency   . Weakness     Allergies: No Known Allergies   Review of Systems:  Review of Systems  Constitutional: Negative for chills, fever and malaise/fatigue.  HENT: Negative for congestion, ear pain and sore throat.   Eyes: Negative.   Respiratory: Negative for cough, shortness of breath and wheezing.   Cardiovascular: Negative for chest pain, palpitations and leg swelling.  Gastrointestinal: Negative for abdominal pain, blood in stool, constipation, diarrhea, heartburn and melena.  Genitourinary: Negative.   Skin: Negative.   Neurological: Negative for dizziness, sensory change, loss of consciousness and headaches.  Psychiatric/Behavioral: Negative for depression. The patient is not nervous/anxious and does not have insomnia.     Family history- Review and unchanged  Social history- Review and unchanged  Physical Exam: BP 136/80   Pulse 80   Temp 98.4 F (36.9 C) (Temporal)   Resp 18   Ht 5\' 7"  (1.702 m)   Wt (!) 311 lb (141.1 kg)   BMI 48.71 kg/m  Wt Readings from Last 3 Encounters:  03/22/16 (!) 311 lb (141.1 kg)  01/31/16 (!) 303 lb (137.4 kg)  01/04/16 (!) 302 lb 8 oz (137.2 kg)    General Appearance: Well nourished well developed, in no apparent distress. Eyes: PERRLA, EOMs, conjunctiva no swelling or erythema ENT/Mouth: Ear canals normal without obstruction, swelling, erythma, discharge.  TMs normal bilaterally.  Oropharynx moist, clear, without exudate, or postoropharyngeal swelling. Neck: Supple, thyroid normal,no cervical adenopathy  Respiratory: Respiratory  effort normal, Breath sounds clear A&P without rhonchi, wheeze, or rale.  No retractions, no accessory usage. Cardio: RRR with no MRGs. Brisk peripheral pulses without edema.  Abdomen: Soft, + BS,  Non tender, no guarding, rebound, hernias, masses. Musculoskeletal: Full ROM, 5/5 strength, Normal gait Skin: Warm, dry without rashes, lesions, ecchymosis.  Neuro: Awake and oriented X 3, Cranial nerves intact. Normal muscle tone, no cerebellar symptoms. Psych: Normal affect, Insight and Judgment appropriate.    [name], PA-C 4:02 PM [facility]

## 2016-03-23 ENCOUNTER — Ambulatory Visit: Payer: 59 | Admitting: Physical Therapy

## 2016-03-23 ENCOUNTER — Ambulatory Visit: Payer: 59 | Admitting: Occupational Therapy

## 2016-03-23 ENCOUNTER — Ambulatory Visit: Payer: 59 | Admitting: Speech Pathology

## 2016-03-23 DIAGNOSIS — R471 Dysarthria and anarthria: Secondary | ICD-10-CM

## 2016-03-23 DIAGNOSIS — R2689 Other abnormalities of gait and mobility: Secondary | ICD-10-CM

## 2016-03-23 DIAGNOSIS — R2681 Unsteadiness on feet: Secondary | ICD-10-CM

## 2016-03-23 DIAGNOSIS — R293 Abnormal posture: Secondary | ICD-10-CM

## 2016-03-23 DIAGNOSIS — R29898 Other symptoms and signs involving the musculoskeletal system: Secondary | ICD-10-CM | POA: Diagnosis not present

## 2016-03-23 DIAGNOSIS — R29818 Other symptoms and signs involving the nervous system: Secondary | ICD-10-CM | POA: Diagnosis not present

## 2016-03-23 DIAGNOSIS — R278 Other lack of coordination: Secondary | ICD-10-CM | POA: Diagnosis not present

## 2016-03-23 DIAGNOSIS — N3020 Other chronic cystitis without hematuria: Secondary | ICD-10-CM | POA: Diagnosis not present

## 2016-03-23 LAB — HEMOGLOBIN A1C
HEMOGLOBIN A1C: 7.4 % — AB (ref <5.7)
MEAN PLASMA GLUCOSE: 166 mg/dL

## 2016-03-23 NOTE — Therapy (Signed)
[facility] [address] [place], [place], [address] Phone: [phone]   Fax:  [phone]  Occupational Therapy Treatment  Patient Details  Name: Todd Rice MRN: [id number] Date of Birth: [birth year] Referring Provider: Dr. [name]  Encounter Date: 03/23/2016      OT End of Session - 03/23/16 1449    Visit Number 8   Number of Visits 17   Date for OT Re-Evaluation 04/08/16   Authorization Type [facility] UMR   OT Start Time 1450   OT Stop Time 1530   OT Time Calculation (min) 40 min   Activity Tolerance Patient tolerated treatment well   Behavior During Therapy [facility] for tasks assessed/performed      Past Medical History:  Diagnosis Date  . Adult BMI 50.0-59.9 kg/sq m    52.77  . Anal fissure   . Anxiety   . Diabetes mellitus without complication (HCC)    borderline  . GERD (gastroesophageal reflux disease)    30 years ago, maybe from peptic ulcer  . H/O: gout    STABLE  . Hepatic steatosis 10/01/11  . History of kidney stones   . Hyperlipidemia   . Hypertension   . Kidney tumor    right upper kidney tumor, monitoring  . OSA on CPAP   . Parkinson disease ([place]) 01/04/2016  . Right ureteral stone   . Vitamin D deficiency   . Weakness     Past Surgical History:  Procedure Laterality Date  . ANAL FISSURE REPAIR  10-12-2000  . COLONOSCOPY N/A 07/01/2012   Procedure: COLONOSCOPY;  Surgeon: Lafayette Dragon, MD;  [facility];  Service: Endoscopy;  Laterality: N/A;  . CYSTOSCOPY WITH RETROGRADE PYELOGRAM, URETEROSCOPY AND STENT PLACEMENT Bilateral 02/26/2013   Procedure: CYSTOSCOPY WITH RETROGRADE PYELOGRAM, URETEROSCOPY AND STENT PLACEMENT;  Surgeon: Alexis Frock, MD;  [facility]: [facility];  Service: Urology;  Laterality: Bilateral;  . HOLMIUM LASER APPLICATION Bilateral [id number]   Procedure: HOLMIUM LASER APPLICATION;  Surgeon: Alexis Frock, MD;  Location: [facility];  Service: Urology;  Laterality:  Bilateral;  . kidney stone removal     06/2011-stent placement  . LEFT MEDIAL FEMORAL CONDYLE DEBRIDEMENT & DRILLING/ REMOVAL LOOSE BODY  09-15-2003  . NASAL SEPTUM SURGERY  1985  . STONE EXTRACTION WITH BASKET  06/12/2011   Procedure: STONE EXTRACTION WITH BASKET;  Surgeon: Claybon Jabs, MD;  Location: [facility];  Service: Urology;  Laterality: Right;    There were no vitals filed for this visit.    PWR! Moves (basic 4) in supine x 10-20 each with min cues For incr movement amplitude.  Pt responded well to cueing.  In standing,  functional reaching in diagonal pattern incorporating trunk rotation/wt. shift and PWR! Reach to toss scarves with PWR! Hands and PWR step  With min cues for incr movment amplitude.  In standing, step and reach forward/ back in diagonal pattern incorporating trunk rotation/wt. shift and PWR! reach to place large pegs in vertical pegboard with set-up for large amplitude movements and min cues for PWR! Hands. Mod difficulty initially with in-hand manipulation, but improved to min difficulty with repetition.  Sliding cards off table with each hand by using PWR! Hands with focus on finger ext and min cues for incr movement amplitude.  Dealing cards and re-organizing in hand to fip incorporating supination and thumb movement with each hand with min-mod cues (for incr movement amplitude)/difficulty, but improved with repetition.  OT Short Term Goals - 03/15/16 1452      OT SHORT TERM GOAL #1   Title I with PD  specific HEP.   Time 4   Period Weeks   Status Achieved     OT SHORT TERM GOAL #2   Title I with adapted strategies for ADLs/ IADLs.   Time 4   Period Weeks   Status On-going     OT SHORT TERM GOAL #3   Title Pt will demonstrate ability to write a sentence with 95% legibility, good letter size with only minimal decrease in letter size.   Time 4   Status Achieved     OT SHORT  TERM GOAL #4   Title Pt will demonstrate improved RUE functional use as evidenced by increasing box/ blocks score to 43 blocks or greater.   Status On-going     OT SHORT TERM GOAL #5   Title Pt will increase bilateral standing functional reach to 10 inches or greater bilaterally for decreased fall risk   Time 4   Status On-going           OT Long Term Goals - 03/15/16 1704      OT LONG TERM GOAL #1   Title Pt will verbalize understanding of community resources and ways to prevent future PD related  complications.   Time 8   Period Weeks   Status New     OT LONG TERM GOAL #2   Title Pt will demonstrate improved fine motor coordination as evidenced by decreasing RUE 9 hole peg test to 35 secs or less.   Baseline RUE 39.81 secs, LUE 30.72 secs   Time 8   Period Weeks   Status New     OT LONG TERM GOAL #3   Title Pt will demonstrate improved ability to fasten buttons as evidenced by decreasing 3 button/ unbutton test to 45 secs or less.   Time 8   Period Weeks   Status New     OT LONG TERM GOAL #4   Title Pt will verbalize understanding of ways to keep thinking skills sharp and ways to compensate for memory changes PRN..   Time 8   Period Weeks   Status Achieved     OT LONG TERM GOAL #5   Title Pt will report UE pain no greater than 3/10 for ADLs/IADLs.   Time 8   Period Weeks   Status New               Plan - 03/23/16 1449    Clinical Impression Statement Pt is progressing towards goals with improving movement amplitude during functional tasks.  Pt has missed visits due to illness, but reports improved performance at work using strategies previously recommended.   Rehab Potential Good   OT Frequency 2x / week   OT Duration 8 weeks   OT Treatment/Interventions Self-care/ADL training;Moist Heat;Fluidtherapy;DME and/or AE instruction;Splinting;Patient/family education;Balance training;Therapeutic exercises;Gait Training;Contrast Bath;Ultrasound;Therapeutic  exercise;Therapeutic activities;Cognitive remediation/compensation;Passive range of motion;Functional Mobility Training;Neuromuscular education;Cryotherapy;Electrical Stimulation;Parrafin;Energy conservation;Manual Therapy;Visual/perceptual remediation/compensation   Plan check remaining STGs   OT Home Exercise Plan issued PWR! seated quadraped and coordination HEP, ways to prevent future PD related complications, big movements with ADLs handout   Consulted and Agree with Plan of Care Patient;Family member/caregiver      Patient will benefit from skilled therapeutic intervention in order to improve the following deficits and impairments:  Abnormal gait, Decreased range of motion, Difficulty walking, Impaired flexibility, Decreased safety awareness, Decreased endurance, Decreased activity  tolerance, Decreased knowledge of precautions, Impaired tone, Impaired UE functional use, Pain, Decreased balance, Decreased cognition, Decreased mobility, Decreased strength  Visit Diagnosis: Other symptoms and signs involving the nervous system  Other symptoms and signs involving the musculoskeletal system  Other lack of coordination  Other abnormalities of gait and mobility  Abnormal posture  Unsteadiness on feet    Problem List Patient Active Problem List   Diagnosis Date Noted  . Parkinson disease ([place]) 01/04/2016  . Seasonal and perennial allergic rhinitis 06/21/2014  . Asthma, mild intermittent, well-controlled 06/21/2014  . Pseudomonas aeruginosa colonization 10/08/2012  . Hemorrhage of rectum and anus 07/01/2012  . CAD (coronary artery disease) 11/15/2011  . Hypertension 11/15/2011  . Hyperlipidemia 11/15/2011  . Obesity 11/15/2011  . Ureteral calculus, right 06/12/2011    [facility] 03/23/2016, 3:37 PM  [facility] [address] [place], [place], [address] Phone: [phone]   Fax:  [phone]  Name: Todd Rice MRN: [id number] Date of Birth: 1955/10/30   Vianne Bulls, OTR/L [facility] [address] [place], [place]  [address] [phone] phone [phone] 03/23/16 3:58 PM

## 2016-03-23 NOTE — Therapy (Signed)
[facility] [address] [place], [place], [address] Phone: [phone]   Fax:  [phone]  Speech Language Pathology Treatment  Patient Details  Name: Todd Rice MRN: [id number] Date of Birth: [birth year] Referring Provider: Neldon Newport MD  Encounter Date: 03/23/2016      End of Session - 03/23/16 1443    Visit Number 3   Number of Visits 16   Date for SLP Re-Evaluation 05/05/16   SLP Start Time 45   SLP Stop Time  1444   SLP Time Calculation (min) 42 min   Activity Tolerance Patient tolerated treatment well      Past Medical History:  Diagnosis Date  . Adult BMI 50.0-59.9 kg/sq m    52.77  . Anal fissure   . Anxiety   . Diabetes mellitus without complication (HCC)    borderline  . GERD (gastroesophageal reflux disease)    30 years ago, maybe from peptic ulcer  . H/O: gout    STABLE  . Hepatic steatosis 10/01/11  . History of kidney stones   . Hyperlipidemia   . Hypertension   . Kidney tumor    right upper kidney tumor, monitoring  . OSA on CPAP   . Parkinson disease ([place]) 01/04/2016  . Right ureteral stone   . Vitamin D deficiency   . Weakness     Past Surgical History:  Procedure Laterality Date  . ANAL FISSURE REPAIR  10-12-2000  . COLONOSCOPY N/A 07/01/2012   Procedure: COLONOSCOPY;  Surgeon: Lafayette Dragon, MD;  [facility];  Service: Endoscopy;  Laterality: N/A;  . CYSTOSCOPY WITH RETROGRADE PYELOGRAM, URETEROSCOPY AND STENT PLACEMENT Bilateral 02/26/2013   Procedure: CYSTOSCOPY WITH RETROGRADE PYELOGRAM, URETEROSCOPY AND STENT PLACEMENT;  Surgeon: Alexis Frock, MD;  [facility];  Service: Urology;  Laterality: Bilateral;  . HOLMIUM LASER APPLICATION Bilateral [id number]   Procedure: HOLMIUM LASER APPLICATION;  Surgeon: Alexis Frock, MD;  [facility];  Service: Urology;  Laterality: Bilateral;  . kidney stone removal     06/2011-stent placement  . LEFT MEDIAL  FEMORAL CONDYLE DEBRIDEMENT & DRILLING/ REMOVAL LOOSE BODY  09-15-2003  . NASAL SEPTUM SURGERY  1985  . STONE EXTRACTION WITH BASKET  06/12/2011   Procedure: STONE EXTRACTION WITH BASKET;  Surgeon: Claybon Jabs, MD;  Location: [facility];  Service: Urology;  Laterality: Right;    There were no vitals filed for this visit.      Subjective Assessment - 03/23/16 1410    Subjective "I had bronchitits, they just released me to work"   Patient is accompained by: Family member   Special Tests wife               ADULT SLP TREATMENT - 03/23/16 1416      General Information   Behavior/Cognition Alert;Cooperative;Pleasant mood     Treatment Provided   Treatment provided Cognitive-Linquistic     Pain Assessment   Pain Assessment No/denies pain     Cognitive-Linquistic Treatment   Treatment focused on Dysarthria   Skilled Treatment Recalibrated loud volume with loud /a/ average of 93dB after re-shaping to reduce tension.  Structured speech tasks with average 72dB with occasional min verbal and visual cues for loudness. Simple conversation average 70dB with occasional min A.      Assessment / Recommendations / Plan   Plan Continue with current plan of care     Progression Toward Goals   Progression toward goals Progressing toward goals  SLP Short Term Goals - 03/23/16 1442      SLP SHORT TERM GOAL #1   Title pt will maintain loud /a/ average 94dB with appropriate voicing (<15% strained/strangled voice) over 4 sessions   Time 3   Period Weeks   Status On-going     SLP SHORT TERM GOAL #2   Title pt will produce 19/20 sentences with average 70dB over two sessions   Time 3   Period Weeks   Status On-going     SLP SHORT TERM GOAL #3   Title pt will demo simple conversation of 8 minutes with average 70dB over three sessions   Time 3   Period Weeks   Status On-going     SLP SHORT TERM GOAL #4   Title pt will exhibit loud "Hey!" with average  95dB over 3 sessions   Time 3   Period Weeks   Status On-going          SLP Long Term Goals - 03/23/16 1442      SLP LONG TERM GOAL #1   Title pt will maintain loud /a/ at 95dB with proper voicing over 5 sessions   Time 7   Period Weeks   Status On-going     SLP LONG TERM GOAL #2   Title pt will demo 10 minutes mod complex conversastion with average 70dB over three sessions   Time 7   Period Weeks   Status On-going          Plan - 03/23/16 1440    Clinical Impression Statement Pt requried occasional min A to maintain loud volume of 70dB in structured speepch tasks and conversation. Continue skilled ST to maximize intellgilbility.   Speech Therapy Frequency 2x / week   Treatment/Interventions Compensatory strategies;Patient/family education;Functional tasks;Cueing hierarchy;SLP instruction and feedback;Internal/external aids   Potential to Achieve Goals Good   Consulted and Agree with Plan of Care Patient      Patient will benefit from skilled therapeutic intervention in order to improve the following deficits and impairments:   Dysarthria and anarthria    Problem List Patient Active Problem List   Diagnosis Date Noted  . Parkinson disease ([place]) 01/04/2016  . Seasonal and perennial allergic rhinitis 06/21/2014  . Asthma, mild intermittent, well-controlled 06/21/2014  . Pseudomonas aeruginosa colonization 10/08/2012  . Hemorrhage of rectum and anus 07/01/2012  . CAD (coronary artery disease) 11/15/2011  . Hypertension 11/15/2011  . Hyperlipidemia 11/15/2011  . Obesity 11/15/2011  . Ureteral calculus, right 06/12/2011    Tamerra Merkley, Annye Rusk MS, CCC-SLP 03/23/2016, 2:44 PM  [facility] [address] [place], [place], [address] Phone: [phone]   Fax:  [phone]   Name: Todd Rice MRN: [id number] Date of Birth: 07-Jun-1955

## 2016-03-24 ENCOUNTER — Other Ambulatory Visit: Payer: Self-pay | Admitting: *Deleted

## 2016-03-24 MED ORDER — SITAGLIPTIN PHOSPHATE 100 MG PO TABS: 100.0000 mg | ORAL_TABLET | Freq: Every day | ORAL | 0 refills | Status: DC

## 2016-03-26 NOTE — Therapy (Signed)
[facility] [address] [place], [place], [address] Phone: [phone]   Fax:  [phone]  Physical Therapy Treatment  Patient Details  Name: Todd Rice MRN: [id number] Date of Birth: 10/12/1955 Referring Provider: [name]  Encounter Date: 03/23/2016      PT End of Session - 03/26/16 [id number]    Visit Number 5   Number of Visits 13   Date for PT Re-Evaluation 04/18/16   Authorization Type UMR   PT Start Time 1319   PT Stop Time 1400   PT Time Calculation (min) 41 min   Activity Tolerance Patient tolerated treatment well   Behavior During Therapy [facility] for tasks assessed/performed      Past Medical History:  Diagnosis Date  . Adult BMI 50.0-59.9 kg/sq m    52.77  . Anal fissure   . Anxiety   . Diabetes mellitus without complication (HCC)    borderline  . GERD (gastroesophageal reflux disease)    30 years ago, maybe from peptic ulcer  . H/O: gout    STABLE  . Hepatic steatosis 10/01/11  . History of kidney stones   . Hyperlipidemia   . Hypertension   . Kidney tumor    right upper kidney tumor, monitoring  . OSA on CPAP   . Parkinson disease ([place]) 01/04/2016  . Right ureteral stone   . Vitamin D deficiency   . Weakness     Past Surgical History:  Procedure Laterality Date  . ANAL FISSURE REPAIR  10-12-2000  . COLONOSCOPY N/A 07/01/2012   Procedure: COLONOSCOPY;  Surgeon: Lafayette Dragon, MD;  [facility];  Service: Endoscopy;  Laterality: N/A;  . CYSTOSCOPY WITH RETROGRADE PYELOGRAM, URETEROSCOPY AND STENT PLACEMENT Bilateral 02/26/2013   Procedure: CYSTOSCOPY WITH RETROGRADE PYELOGRAM, URETEROSCOPY AND STENT PLACEMENT;  Surgeon: Alexis Frock, MD;  [facility];  Service: Urology;  Laterality: Bilateral;  . HOLMIUM LASER APPLICATION Bilateral [id number]   Procedure: HOLMIUM LASER APPLICATION;  Surgeon: Alexis Frock, MD;  [facility];  Service: Urology;  Laterality: Bilateral;   . kidney stone removal     06/2011-stent placement  . LEFT MEDIAL FEMORAL CONDYLE DEBRIDEMENT & DRILLING/ REMOVAL LOOSE BODY  09-15-2003  . NASAL SEPTUM SURGERY  1985  . STONE EXTRACTION WITH BASKET  06/12/2011   Procedure: STONE EXTRACTION WITH BASKET;  Surgeon: Claybon Jabs, MD;  Location: [facility];  Service: Urology;  Laterality: Right;    There were no vitals filed for this visit.      Subjective Assessment - 03/26/16 1537    Subjective Have been sick with bronchitis, but I'm feeling better. Had a fall Friday due to getting tripped up on the stool to get into the bed.   Patient Stated Goals Pt's goal for physical therapy to is regain normal use of R arm and keep from falling.   Currently in Pain? Yes   Pain Score 3    Pain Location Leg   Pain Orientation Right   Pain Descriptors / Indicators Sore  sore to touch   Pain Type Acute pain   Pain Onset In the past 7 days   Pain Frequency Intermittent   Aggravating Factors  injured with fall   Pain Relieving Factors getting better      Neuro Re-education: -Seated PWR! MOves FLOW x 5 reps (PWR! UP>PWR! Rock>PWR! Twist>PWR! Step) -Standing PWR! Moves FLOW x 5 reps as above- -Cues provided with each set of FLOW-verbal and visual cues for technique  and continued large amplitude movement patterns  -Forward step and weightshift x 10 reps -Back step and weightshift x 10 reps -Reviewed HEP (as above) from last visit, with pt return demo understanding  -Varied direction stepping-forward, side, back R and L, for improved balance recovery and step strategy, with cues for upright posture and for technique/amplitude of movements  5x sit<>stand:  11.39 sec   Self Care: Discussed fall prevention in relation to patient's fall from the other day (tripped over step stool getting in/out of bed) as patient didn't remember stool was there.  Discussed lighting, slowed, planned movements, having wife move step stool out of his  way when he is in bed.                           PT Education - 03/26/16 1539    Education provided Yes   Education Details Discussed fall prevention in lieu of recent fall getting into/out of bed tripping on step stool   Person(s) Educated Patient;Spouse   Methods Explanation   Comprehension Verbalized understanding          PT Short Term Goals - 03/23/16 1324      PT SHORT TERM GOAL #1   Title Pt will be independent with HEP for improved balance, transfers, gait.  TARGET 03/19/16   Time 4   Period Weeks   Status Achieved     PT SHORT TERM GOAL #2   Title Pt will verbalize understanding of local community Parkinson's related resources.   Time 4   Period Weeks   Status Achieved     PT SHORT TERM GOAL #3   Title Pt will improve 5x sit<>stand to less than or equal to 11.5 seconds for improved efficiency and safety with gait.   Baseline 11.39 sec 03/23/16   Time 4   Period Weeks   Status Achieved           PT Long Term Goals - 02/18/16 1245      PT LONG TERM GOAL #1   Title Pt will verbalize understanding of fall prevention in home environment.  TARGET 04/18/16   Time 8   Period Weeks   Status New     PT LONG TERM GOAL #2   Title Pt will perform at least 8 of 10 reps of sit<>stand transfers from lower than 18" surfaces, independently, for improved ease of transfers.   Time 8   Period Weeks   Status New     PT LONG TERM GOAL #3   Title Pt will improve TUG and TUG cognitive to <10% difference in times, for improved dual tasking with gait.   Time 8   Period Weeks   Status New     PT LONG TERM GOAL #4   Title Pt will improve 4-square step test to less than or equal to 11 seconds with no LOB, for improved balance with change of directions.   Time 8   Period Weeks   Status New     PT LONG TERM GOAL #5   Title Pt will verbalize plans for continued community fitness upon D/C from PT.   Time 8   Period Weeks   Status New                Plan - 03/26/16 1542    Clinical Impression Statement Pt has missed PT due to being sick with bronchitis.  Checked STGs, with patient meeting STG 1-3.  Worked  on various progressions of PWR! MOves and stepping activities this visit.  Pt will continue to benefit from further skilled PT to address balance, posture,a nd gait activities towards LTGs.   Rehab Potential Good   PT Frequency 2x / week  1x/wk for 1 wk, then   PT Duration 8 weeks   PT Treatment/Interventions ADLs/Self Care Home Management;Functional mobility training;Gait training;Therapeutic activities;Therapeutic exercise;Balance training;Neuromuscular re-education;Patient/family education   PT Next Visit Plan PWR! Moves FLOW in standing; sit<>stand followed by PWR! Moves in standing; multi-direction/varied direction stepping; gait with walking poles-add FLOW to HEP as appropriate   Consulted and Agree with Plan of Care Patient;Family member/caregiver   Family Member Consulted wife      Patient will benefit from skilled therapeutic intervention in order to improve the following deficits and impairments:  Abnormal gait, Decreased balance, Decreased mobility, Difficulty walking, Impaired flexibility, Postural dysfunction  Visit Diagnosis: Other abnormalities of gait and mobility  Unsteadiness on feet  Other symptoms and signs involving the nervous system     Problem List Patient Active Problem List   Diagnosis Date Noted  . Parkinson disease ([place]) 01/04/2016  . Seasonal and perennial allergic rhinitis 06/21/2014  . Asthma, mild intermittent, well-controlled 06/21/2014  . Pseudomonas aeruginosa colonization 10/08/2012  . Hemorrhage of rectum and anus 07/01/2012  . CAD (coronary artery disease) 11/15/2011  . Hypertension 11/15/2011  . Hyperlipidemia 11/15/2011  . Obesity 11/15/2011  . Ureteral calculus, right 06/12/2011    Jessy Cybulski W. 03/26/2016, 3:45 PM Frazier Butt., PT [facility] [address] [place], [place], [address] Phone: [phone]   Fax:  [phone]  Name: Todd Rice MRN: [id number] Date of Birth: 01-17-1956

## 2016-03-28 ENCOUNTER — Encounter: Payer: 59 | Admitting: Occupational Therapy

## 2016-03-28 ENCOUNTER — Ambulatory Visit: Payer: 59 | Admitting: Physical Therapy

## 2016-04-06 ENCOUNTER — Ambulatory Visit: Payer: 59 | Admitting: Physical Therapy

## 2016-04-06 ENCOUNTER — Ambulatory Visit: Payer: 59

## 2016-04-06 ENCOUNTER — Ambulatory Visit: Payer: 59 | Admitting: Occupational Therapy

## 2016-04-06 DIAGNOSIS — N3020 Other chronic cystitis without hematuria: Secondary | ICD-10-CM | POA: Diagnosis not present

## 2016-04-06 DIAGNOSIS — R2681 Unsteadiness on feet: Secondary | ICD-10-CM

## 2016-04-06 DIAGNOSIS — R278 Other lack of coordination: Secondary | ICD-10-CM | POA: Diagnosis not present

## 2016-04-06 DIAGNOSIS — R2689 Other abnormalities of gait and mobility: Secondary | ICD-10-CM | POA: Diagnosis not present

## 2016-04-06 DIAGNOSIS — R29898 Other symptoms and signs involving the musculoskeletal system: Secondary | ICD-10-CM | POA: Diagnosis not present

## 2016-04-06 DIAGNOSIS — R29818 Other symptoms and signs involving the nervous system: Secondary | ICD-10-CM

## 2016-04-06 DIAGNOSIS — R471 Dysarthria and anarthria: Secondary | ICD-10-CM | POA: Diagnosis not present

## 2016-04-06 DIAGNOSIS — R293 Abnormal posture: Secondary | ICD-10-CM | POA: Diagnosis not present

## 2016-04-06 NOTE — Patient Instructions (Signed)
  Please complete the assigned speech therapy homework and return it to your next session.  

## 2016-04-06 NOTE — Therapy (Signed)
[facility] [address] [place], [place], [address] Phone: [phone]   Fax:  [phone]  Speech Language Pathology Treatment  Patient Details  Name: Todd Rice MRN: [id number] Date of Birth: 12-15-1955 Referring Provider: Neldon Newport MD  Encounter Date: 04/06/2016      End of Session - 04/06/16 1449    Visit Number 4   Number of Visits 16   Date for SLP Re-Evaluation 05/05/16   SLP Start Time [id number]   SLP Stop Time  1445   SLP Time Calculation (min) 43 min   Activity Tolerance Patient tolerated treatment well      Past Medical History:  Diagnosis Date  . Adult BMI 50.0-59.9 kg/sq m    52.77  . Anal fissure   . Anxiety   . Diabetes mellitus without complication (HCC)    borderline  . GERD (gastroesophageal reflux disease)    30 years ago, maybe from peptic ulcer  . H/O: gout    STABLE  . Hepatic steatosis 10/01/11  . History of kidney stones   . Hyperlipidemia   . Hypertension   . Kidney tumor    right upper kidney tumor, monitoring  . OSA on CPAP   . Parkinson disease ([place]) 01/04/2016  . Right ureteral stone   . Vitamin D deficiency   . Weakness     Past Surgical History:  Procedure Laterality Date  . ANAL FISSURE REPAIR  10-12-2000  . COLONOSCOPY N/A 07/01/2012   Procedure: COLONOSCOPY;  Surgeon: Lafayette Dragon, MD;  Location: [facility];  Service: Endoscopy;  Laterality: N/A;  . CYSTOSCOPY WITH RETROGRADE PYELOGRAM, URETEROSCOPY AND STENT PLACEMENT Bilateral 02/26/2013   Procedure: CYSTOSCOPY WITH RETROGRADE PYELOGRAM, URETEROSCOPY AND STENT PLACEMENT;  Surgeon: Alexis Frock, MD;  Location: [facility];  Service: Urology;  Laterality: Bilateral;  . HOLMIUM LASER APPLICATION Bilateral [id number]   Procedure: HOLMIUM LASER APPLICATION;  Surgeon: Alexis Frock, MD;  Location: [facility];  Service: Urology;  Laterality: Bilateral;  . kidney stone removal     06/2011-stent placement  . LEFT MEDIAL  FEMORAL CONDYLE DEBRIDEMENT & DRILLING/ REMOVAL LOOSE BODY  09-15-2003  . NASAL SEPTUM SURGERY  1985  . STONE EXTRACTION WITH BASKET  06/12/2011   Procedure: STONE EXTRACTION WITH BASKET;  Surgeon: Claybon Jabs, MD;  Location: [facility];  Service: Urology;  Laterality: Right;    There were no vitals filed for this visit.      Subjective Assessment - 04/06/16 1401    Subjective "Been trying to talk louder like you want me to."   Patient is accompained by: Family member  wife   Currently in Pain? No/denies               ADULT SLP TREATMENT - 04/06/16 1407      General Information   Behavior/Cognition Alert;Cooperative;Pleasant mood     Treatment Provided   Treatment provided Cognitive-Linquistic     Cognitive-Linquistic Treatment   Treatment focused on Dysarthria   Skilled Treatment Recalibrated loud volume with loud /a/ average of 94dB after cues from SLP to start with easier onset rather than hard onset.  Structured speech tasks with average 72dB with rare min verbal cues initially for loudness, independent at end of task. Simple conversation average 70dB with occasional min A.      Assessment / Recommendations / Plan   Plan Continue with current plan of care     Progression Toward Goals   Progression toward goals Progressing  toward goals            SLP Short Term Goals - 04/06/16 1452      SLP SHORT TERM GOAL #1   Title pt will maintain loud /a/ average 94dB with appropriate voicing (<15% strained/strangled voice) over 4 sessions   Time 2   Period Weeks   Status On-going     SLP SHORT TERM GOAL #2   Title pt will produce 19/20 sentences with average 70dB over two sessions   Time 2   Period Weeks   Status On-going     SLP SHORT TERM GOAL #3   Title pt will demo simple conversation of 8 minutes with average 70dB over three sessions   Time 2   Period Weeks   Status On-going     SLP SHORT TERM GOAL #4   Title pt will exhibit loud  "Hey!" with average 95dB over 3 sessions   Time 2   Period Weeks   Status On-going          SLP Long Term Goals - 04/06/16 1452      SLP LONG TERM GOAL #1   Title pt will maintain loud /a/ at 95dB with proper voicing over 5 sessions   Time 6   Period Weeks   Status On-going     SLP LONG TERM GOAL #2   Title pt will demo 10 minutes mod complex conversastion with average 70dB over three sessions   Time 6   Period Weeks   Status On-going          Plan - 04/06/16 1449    Clinical Impression Statement Pt requried occasional min A to maintain WNL volume of 70dB in conversation. In structured theapy tasks pt maintained WNL volume consistently today. Continue skilled ST to maximize intellgilbility.   Speech Therapy Frequency 2x / week   Treatment/Interventions Compensatory strategies;Patient/family education;Functional tasks;Cueing hierarchy;SLP instruction and feedback;Internal/external aids   Potential to Achieve Goals Good   Consulted and Agree with Plan of Care Patient      Patient will benefit from skilled therapeutic intervention in order to improve the following deficits and impairments:   Dysarthria and anarthria    Problem List Patient Active Problem List   Diagnosis Date Noted  . Parkinson disease ([place]) 01/04/2016  . Seasonal and perennial allergic rhinitis 06/21/2014  . Asthma, mild intermittent, well-controlled 06/21/2014  . Pseudomonas aeruginosa colonization 10/08/2012  . Hemorrhage of rectum and anus 07/01/2012  . CAD (coronary artery disease) 11/15/2011  . Hypertension 11/15/2011  . Hyperlipidemia 11/15/2011  . Obesity 11/15/2011  . Ureteral calculus, right 06/12/2011    [facility] ,[place], CCC-SLP  04/06/2016, 2:52 PM  [facility] [address] [place], [place], [address] Phone: [phone]   Fax:  [phone]   Name: Todd Rice MRN: [id number] Date of Birth: 1955/08/01

## 2016-04-06 NOTE — Therapy (Signed)
[facility] [address] [place], [place], [address] Phone: [phone]   Fax:  [phone]  Occupational Therapy Treatment  Patient Details  Name: Todd Rice MRN: [id number] Date of Birth: 06-16-1955 Referring Provider: Dr. [name]  Encounter Date: 04/06/2016      OT End of Session - 04/06/16 1514    Visit Number 9   Number of Visits 17   Date for OT Re-Evaluation 04/08/16   Authorization Type [employer] UMR      Past Medical History:  Diagnosis Date  . Adult BMI 50.0-59.9 kg/sq m    52.77  . Anal fissure   . Anxiety   . Diabetes mellitus without complication (HCC)    borderline  . GERD (gastroesophageal reflux disease)    30 years ago, maybe from peptic ulcer  . H/O: gout    STABLE  . Hepatic steatosis 10/01/11  . History of kidney stones   . Hyperlipidemia   . Hypertension   . Kidney tumor    right upper kidney tumor, monitoring  . OSA on CPAP   . Parkinson disease ([place]) 01/04/2016  . Right ureteral stone   . Vitamin D deficiency   . Weakness     Past Surgical History:  Procedure Laterality Date  . ANAL FISSURE REPAIR  10-12-2000  . COLONOSCOPY N/A 07/01/2012   Procedure: COLONOSCOPY;  Surgeon: Lafayette Dragon, MD;  [facility];  Service: Endoscopy;  Laterality: N/A;  . CYSTOSCOPY WITH RETROGRADE PYELOGRAM, URETEROSCOPY AND STENT PLACEMENT Bilateral 02/26/2013   Procedure: CYSTOSCOPY WITH RETROGRADE PYELOGRAM, URETEROSCOPY AND STENT PLACEMENT;  Surgeon: Alexis Frock, MD;  [facility];  Service: Urology;  Laterality: Bilateral;  . HOLMIUM LASER APPLICATION Bilateral [id number]   Procedure: HOLMIUM LASER APPLICATION;  Surgeon: Alexis Frock, MD;  Location: [facility];  Service: Urology;  Laterality: Bilateral;  . kidney stone removal     06/2011-stent placement  . LEFT MEDIAL FEMORAL CONDYLE DEBRIDEMENT & DRILLING/ REMOVAL LOOSE BODY  09-15-2003  . NASAL SEPTUM SURGERY  1985  . STONE  EXTRACTION WITH BASKET  06/12/2011   Procedure: STONE EXTRACTION WITH BASKET;  Surgeon: Claybon Jabs, MD;  Location: [facility];  Service: Urology;  Laterality: Right;    There were no vitals filed for this visit.      Subjective Assessment - 04/06/16 1600    Subjective  Denies pain, reports hand is stiffer and tighter   Patient Stated Goals improve function in right hand   Currently in Pain? No/denies                  Dynamic step and reach with trunk rotation and emphasis on full finger extension with grasp release particularly for RUE, mod v.c. Fine motor coordination tasks, placing O'Connor pegs with fingertips after PWR! Hands then removing with tweezers for simulated work activity, min v.c. Stringing various sized beads for increased fine motor coordination with bilateral UE's. Flicking cards off tabletop to promote full finger extension with RUE, min v.c.       PWR [facility]) - 04/06/16 1559    PWR! exercises Moves in [place]! Up x 15    PWR! Rock x10 in [place]  reviewed in modified [place]   PWR! Twist  x20 over bench in modified quadraped   Comments min v.c. and demonstration for technique and large amplitude movvement               OT Short Term Goals -  03/15/16 1452      OT SHORT TERM GOAL #1   Title I with PD  specific HEP.   Time 4   Period Weeks   Status Achieved     OT SHORT TERM GOAL #2   Title I with adapted strategies for ADLs/ IADLs.   Time 4   Period Weeks   Status On-going     OT SHORT TERM GOAL #3   Title Pt will demonstrate ability to write a sentence with 95% legibility, good letter size with only minimal decrease in letter size.   Time 4   Status Achieved     OT SHORT TERM GOAL #4   Title Pt will demonstrate improved RUE functional use as evidenced by increasing box/ blocks score to 43 blocks or greater.   Status On-going     OT SHORT TERM GOAL #5   Title Pt will increase bilateral standing  functional reach to 10 inches or greater bilaterally for decreased fall risk   Time 4   Status On-going           OT Long Term Goals - 03/15/16 1704      OT LONG TERM GOAL #1   Title Pt will verbalize understanding of community resources and ways to prevent future PD related  complications.   Time 8   Period Weeks   Status New     OT LONG TERM GOAL #2   Title Pt will demonstrate improved fine motor coordination as evidenced by decreasing RUE 9 hole peg test to 35 secs or less.   Baseline RUE 39.81 secs, LUE 30.72 secs   Time 8   Period Weeks   Status New     OT LONG TERM GOAL #3   Title Pt will demonstrate improved ability to fasten buttons as evidenced by decreasing 3 button/ unbutton test to 45 secs or less.   Time 8   Period Weeks   Status New     OT LONG TERM GOAL #4   Title Pt will verbalize understanding of ways to keep thinking skills sharp and ways to compensate for memory changes PRN..   Time 8   Period Weeks   Status Achieved     OT LONG TERM GOAL #5   Title Pt will report UE pain no greater than 3/10 for ADLs/IADLs.   Time 8   Period Weeks   Status New               Plan - 04/06/16 1553    Clinical Impression Statement Pt reports his right hand is stiff and difficult to use. However pt demonstrates improved functional use of RUE after stretching and PWR! hands   Rehab Potential Good   OT Frequency 2x / week   OT Duration 8 weeks   OT Treatment/Interventions Self-care/ADL training;Moist Heat;Fluidtherapy;DME and/or AE instruction;Splinting;Patient/family education;Balance training;Therapeutic exercises;Gait Training;Contrast Bath;Ultrasound;Therapeutic exercise;Therapeutic activities;Cognitive remediation/compensation;Passive range of motion;Functional Mobility Training;Neuromuscular education;Cryotherapy;Electrical Stimulation;Parrafin;Energy conservation;Manual Therapy;Visual/perceptual remediation/compensation   Plan finish checking short  term  goals   OT Home Exercise Plan issued PWR! seated quadraped and coordination HEP, ways to prevent future PD related complications, big movements with ADLs handout   Consulted and Agree with Plan of Care Patient;Family member/caregiver      Patient will benefit from skilled therapeutic intervention in order to improve the following deficits and impairments:  Abnormal gait, Decreased range of motion, Difficulty walking, Impaired flexibility, Decreased safety awareness, Decreased endurance, Decreased activity tolerance, Decreased knowledge of precautions, Impaired tone, Impaired  UE functional use, Pain, Decreased balance, Decreased cognition, Decreased mobility, Decreased strength  Visit Diagnosis: Other abnormalities of gait and mobility  Unsteadiness on feet  Other symptoms and signs involving the nervous system  Other symptoms and signs involving the musculoskeletal system  Other lack of coordination  Abnormal posture    Problem List Patient Active Problem List   Diagnosis Date Noted  . Parkinson disease ([place]) 01/04/2016  . Seasonal and perennial allergic rhinitis 06/21/2014  . Asthma, mild intermittent, well-controlled 06/21/2014  . Pseudomonas aeruginosa colonization 10/08/2012  . Hemorrhage of rectum and anus 07/01/2012  . CAD (coronary artery disease) 11/15/2011  . Hypertension 11/15/2011  . Hyperlipidemia 11/15/2011  . Obesity 11/15/2011  . Ureteral calculus, right 06/12/2011    Valda Christenson 04/06/2016, 4:01 PM  [facility] [address] [place], [place], [address] Phone: [phone]   Fax:  [phone]  Name: SEAMON BRUEGGEMANN MRN: [id number] Date of Birth: 23-May-1955

## 2016-04-06 NOTE — Patient Instructions (Addendum)
PWR! Moves FLOW: -PWR! Up>PWR! Rock>PWR! Twist>PWR! Step  5-10 reps each time   Turning in Place: Compliant Surface (Grass / Sand)    While walking and prior to turning, lead with head and turn slowly to start walking the direction you need to go.   Copyright  VHI. All rights reserved.

## 2016-04-06 NOTE — Therapy (Signed)
[facility] [address] [place], [place], [address] Phone: [phone]   Fax:  [phone]  Physical Therapy Treatment  Patient Details  Name: Todd Rice MRN: [id number] Date of Birth: [birth year] Referring Provider: [name]  Encounter Date: 04/06/2016      PT End of Session - 04/06/16 1420    Visit Number 6   Number of Visits 13   Date for PT Re-Evaluation 04/18/16   Authorization Type UMR   PT Start Time 1318   PT Stop Time 1401   PT Time Calculation (min) 43 min   Activity Tolerance Patient tolerated treatment well   Behavior During Therapy [facility] for tasks assessed/performed      Past Medical History:  Diagnosis Date  . Adult BMI 50.0-59.9 kg/sq m    52.77  . Anal fissure   . Anxiety   . Diabetes mellitus without complication (HCC)    borderline  . GERD (gastroesophageal reflux disease)    30 years ago, maybe from peptic ulcer  . H/O: gout    STABLE  . Hepatic steatosis 10/01/11  . History of kidney stones   . Hyperlipidemia   . Hypertension   . Kidney tumor    right upper kidney tumor, monitoring  . OSA on CPAP   . Parkinson disease ([place]) 01/04/2016  . Right ureteral stone   . Vitamin D deficiency   . Weakness     Past Surgical History:  Procedure Laterality Date  . ANAL FISSURE REPAIR  10-12-2000  . COLONOSCOPY N/A 07/01/2012   Procedure: COLONOSCOPY;  Surgeon: Lafayette Dragon, MD;  [facility];  Service: Endoscopy;  Laterality: N/A;  . CYSTOSCOPY WITH RETROGRADE PYELOGRAM, URETEROSCOPY AND STENT PLACEMENT Bilateral 02/26/2013   Procedure: CYSTOSCOPY WITH RETROGRADE PYELOGRAM, URETEROSCOPY AND STENT PLACEMENT;  Surgeon: Alexis Frock, MD;  [facility];  Service: Urology;  Laterality: Bilateral;  . HOLMIUM LASER APPLICATION Bilateral [id number]   Procedure: HOLMIUM LASER APPLICATION;  Surgeon: Alexis Frock, MD;  [facility];  Service: Urology;  Laterality: Bilateral;   . kidney stone removal     06/2011-stent placement  . LEFT MEDIAL FEMORAL CONDYLE DEBRIDEMENT & DRILLING/ REMOVAL LOOSE BODY  09-15-2003  . NASAL SEPTUM SURGERY  1985  . STONE EXTRACTION WITH BASKET  06/12/2011   Procedure: STONE EXTRACTION WITH BASKET;  Surgeon: Claybon Jabs, MD;  Location: [facility];  Service: Urology;  Laterality: Right;    There were no vitals filed for this visit.      Subjective Assessment - 04/06/16 1324    Subjective Just feel off today-had a lot going on at work.  Noticing a little more tremor in my R hand.   Patient is accompained by: Family member  wife   Patient Stated Goals Pt's goal for physical therapy to is regain normal use of R arm and keep from falling.   Currently in Pain? No/denies   Pain Onset In the past 7 days      Recommended patient journal symptoms and contact neurologist regarding new symptoms/progression of symptoms.                   [facility] Adult PT Treatment/Exercise - 04/06/16 0001      Ambulation/Gait   Ambulation/Gait Yes   Ambulation/Gait Assistance 5: Supervision   Ambulation Distance (Feet) 600 Feet  then 250 with head turns to look at cards   Assistive device None;Other (Comment)   Gait Pattern Step-through pattern;Decreased arm swing -  right;Decreased step length - right;Decreased stance time - right;Decreased trunk rotation  cues for increased R arm swing   Ambulation Surface Level;Indoor   Gait Comments Cues for upright posture and use of visual targets at eye level, with head turns to look at visual target as he is changing direction with turns.           PWR [facility]) - 04/06/16 1326    PWR! exercises Moves in standing   Basic 4 Flow x 5 reps standing with visual and verbal cues   Comments Pt needs cues for head turns and eyes focused while performing standing PWR! Moves in FLOW sequence          Balance Exercises - 04/06/16 1410      Balance Exercises: Standing   Stepping  Strategy Anterior;Posterior;Lateral;10 reps  Each, coordinated arms and head movements   Other Standing Exercises Single limb standing exercises with standing PWR! Rock with lower extremities only, with wide BOS and cues to hold on one leg 1-2 seconds; alternating step taps to 6" step x 10 reps, then to 12" step x 10 reps without UE support; forward step taps>posterior step taps x 10 reps each with intermittent UE support.  SLS activity (dynamic) with stepping over obstacles in obstacle course and stepping on balance disks and stepping stones, with cues for full upright posture on SLS with widened BOS and avoiding cross-over steps/narrowed BOS when negotiating through obstacle course.           PT Education - 04/06/16 1419    Education provided Yes   Education Details Head turns/use of visual target with gait; addition of standing PWR! Moves in FLOW to HEP   Person(s) Educated Patient;Spouse   Methods Explanation;Demonstration;Handout   Comprehension Verbalized understanding;Returned demonstration          PT Short Term Goals - 03/23/16 1324      PT SHORT TERM GOAL #1   Title Pt will be independent with HEP for improved balance, transfers, gait.  TARGET 03/19/16   Time 4   Period Weeks   Status Achieved     PT SHORT TERM GOAL #2   Title Pt will verbalize understanding of local community Parkinson's related resources.   Time 4   Period Weeks   Status Achieved     PT SHORT TERM GOAL #3   Title Pt will improve 5x sit<>stand to less than or equal to 11.5 seconds for improved efficiency and safety with gait.   Baseline 11.39 sec 03/23/16   Time 4   Period Weeks   Status Achieved           PT Long Term Goals - 02/18/16 1245      PT LONG TERM GOAL #1   Title Pt will verbalize understanding of fall prevention in home environment.  TARGET 04/18/16   Time 8   Period Weeks   Status New     PT LONG TERM GOAL #2   Title Pt will perform at least 8 of 10 reps of sit<>stand  transfers from lower than 18" surfaces, independently, for improved ease of transfers.   Time 8   Period Weeks   Status New     PT LONG TERM GOAL #3   Title Pt will improve TUG and TUG cognitive to <10% difference in times, for improved dual tasking with gait.   Time 8   Period Weeks   Status New     PT LONG TERM GOAL #4   Title  Pt will improve 4-square step test to less than or equal to 11 seconds with no LOB, for improved balance with change of directions.   Time 8   Period Weeks   Status New     PT LONG TERM GOAL #5   Title Pt will verbalize plans for continued community fitness upon D/C from PT.   Time 8   Period Weeks   Status New               Plan - 04/06/16 1420    Clinical Impression Statement Addressed patient's concerns regarding veering to the right with gait and difficulty with single limb stance during PT session today.  Pt has difficulty maintaining SLS with dynamic activities without UE support or quickly reverting to narrow BOS/crossover step to regain balance.  Pt will continue to benefit from further skilled PT to address dynamic balance and gait activities.   Rehab Potential Good   PT Frequency 2x / week  1x/wk for 1 wk, then   PT Duration 8 weeks   PT Treatment/Interventions ADLs/Self Care Home Management;Functional mobility training;Gait training;Therapeutic activities;Therapeutic exercise;Balance training;Neuromuscular re-education;Patient/family education   PT Next Visit Plan Sit<>stand followed by PWR! Moves in standing; multi-direction/varied direction stepping; gait with walking poles; dynamic SLS activities   Consulted and Agree with Plan of Care Patient;Family member/caregiver   Family Member Consulted wife      Patient will benefit from skilled therapeutic intervention in order to improve the following deficits and impairments:  Abnormal gait, Decreased balance, Decreased mobility, Difficulty walking, Impaired flexibility, Postural  dysfunction  Visit Diagnosis: Unsteadiness on feet  Other abnormalities of gait and mobility     Problem List Patient Active Problem List   Diagnosis Date Noted  . Parkinson disease ([place]) 01/04/2016  . Seasonal and perennial allergic rhinitis 06/21/2014  . Asthma, mild intermittent, well-controlled 06/21/2014  . Pseudomonas aeruginosa colonization 10/08/2012  . Hemorrhage of rectum and anus 07/01/2012  . CAD (coronary artery disease) 11/15/2011  . Hypertension 11/15/2011  . Hyperlipidemia 11/15/2011  . Obesity 11/15/2011  . Ureteral calculus, right 06/12/2011    Frazier Butt. 04/06/2016, 2:28 PM  Frazier Butt., PT  [facility] [address] [place], [place], [address] Phone: [phone]   Fax:  [phone]  Name: Todd Rice MRN: [id number] Date of Birth: 20-Aug-1955

## 2016-04-07 ENCOUNTER — Encounter: Payer: 59 | Admitting: Occupational Therapy

## 2016-04-07 ENCOUNTER — Encounter: Payer: 59 | Admitting: Speech Pathology

## 2016-04-07 ENCOUNTER — Ambulatory Visit: Payer: 59 | Admitting: Physical Therapy

## 2016-04-10 ENCOUNTER — Encounter: Payer: Self-pay | Admitting: Internal Medicine

## 2016-04-10 ENCOUNTER — Ambulatory Visit (INDEPENDENT_AMBULATORY_CARE_PROVIDER_SITE_OTHER): Payer: 59 | Admitting: Internal Medicine

## 2016-04-10 ENCOUNTER — Ambulatory Visit (HOSPITAL_COMMUNITY)
Admission: RE | Admit: 2016-04-10 | Discharge: 2016-04-10 | Disposition: A | Discharge status: Home / Self Care | Payer: 59 | Source: Ambulatory Visit | Attending: Internal Medicine | Admitting: Internal Medicine

## 2016-04-10 ENCOUNTER — Other Ambulatory Visit: Payer: Self-pay | Admitting: Internal Medicine

## 2016-04-10 ENCOUNTER — Other Ambulatory Visit: Payer: Self-pay | Admitting: Neurology

## 2016-04-10 VITALS — BP 136/70 | HR 116 | Temp 101.1°F | Resp 18 | Wt 314.0 lb

## 2016-04-10 DIAGNOSIS — R918 Other nonspecific abnormal finding of lung field: Secondary | ICD-10-CM | POA: Diagnosis not present

## 2016-04-10 DIAGNOSIS — R509 Fever, unspecified: Secondary | ICD-10-CM | POA: Diagnosis not present

## 2016-04-10 DIAGNOSIS — N390 Urinary tract infection, site not specified: Secondary | ICD-10-CM | POA: Diagnosis not present

## 2016-04-10 DIAGNOSIS — R05 Cough: Secondary | ICD-10-CM | POA: Diagnosis not present

## 2016-04-10 MED ORDER — LEVOFLOXACIN 750 MG PO TABS
750.0000 mg | ORAL_TABLET | Freq: Every day | ORAL | 0 refills | Status: DC
Start: 2016-04-10 — End: 2016-05-05

## 2016-04-10 MED ORDER — CEFTRIAXONE SODIUM 1 G IJ SOLR
1.0000 g | Freq: Once | INTRAMUSCULAR | Status: AC
  Administered 2016-04-10: 1 g via INTRAMUSCULAR

## 2016-04-10 MED FILL — levoFLOXacin 750 MG TABS: 750 | 7 days supply | Qty: 7 | Fill #0

## 2016-04-10 NOTE — Progress Notes (Signed)
Subjective:    Patient ID: Todd Rice, male    DOB: 1955-07-12, 60 y.o.   MRN: [id number]  HPI  Patient presents to the office for evaluation of fever and chills.  He reports that he started feeling bad this morning.  He reports that he was okay when he left the house this morning and then he developed a fever at about 8:30.  His coworkers noted that he was wobbling and he also started shaking.  He reports that he has been urinating frequently.  He has will have a mild burn when he urinates, but is not happening every time. He thought he was urinating more frequently due to the change in medications.  He reports that he has been having some constipation.  He reports that he has been having some rectal bleeding due to hard to pass stools.  He has had fevers, chills, nausea, frequency, urgency, and mild dysuria.  He reports that he has had a dry cough that has been going on for four weeks.     Review of Systems  Constitutional: Positive for chills, diaphoresis and fever. Negative for fatigue.  HENT: Positive for congestion and sinus pain. Negative for ear discharge, ear pain, postnasal drip, sore throat, trouble swallowing and voice change.   Respiratory: Positive for shortness of breath. Negative for chest tightness and wheezing.   Neurological: Positive for dizziness and headaches. Negative for light-headedness and numbness.       Objective:   Physical Exam  Constitutional: He is oriented to person, place, and time. He appears well-developed and well-nourished. No distress.  HENT:  Head: Normocephalic.  Mouth/Throat: Oropharynx is clear and moist. No oropharyngeal exudate.  Eyes: Conjunctivae are normal. No scleral icterus.  Neck: Normal range of motion. Neck supple. No JVD present. No thyromegaly present.  Cardiovascular: Normal rate, regular rhythm, normal heart sounds and intact distal pulses.  Exam reveals no gallop and no friction rub.   No murmur heard. Pulmonary/Chest:  Effort normal and breath sounds normal. No respiratory distress. He has no wheezes. He has no rales. He exhibits no tenderness.  Abdominal: Soft. Bowel sounds are normal. He exhibits no distension and no mass. There is no tenderness. There is no rebound and no guarding.  Musculoskeletal: Normal range of motion.  Lymphadenopathy:    He has no cervical adenopathy.  Neurological: He is alert and oriented to person, place, and time. No cranial nerve deficit. Coordination normal.  Skin: Skin is warm and dry. He is not diaphoretic.  Psychiatric: He has a normal mood and affect. His behavior is normal. Judgment and thought content normal.  Nursing note and vitals reviewed.   Vitals:   04/10/16 1034  BP: 136/70  Pulse: (!) 116  Resp: 18  Temp: (!) 101.1 F (38.4 C)          Assessment & Plan:    1. Fever, unspecified fever cause -meets sepsis criteria with probable cause of UTI vs. Prostatitis vs. CAP secondary to 4 weeks of dry cough -CBC, CMP, and UA done at cancer center.  UA with leukocytes, trace bacteria, and RBCs. Urine culture order sent.  C&S pending - DG Chest 2 View; Future - Culture, Urine - cefTRIAXone (ROCEPHIN) injection 1 g; Inject 1 g into the muscle once. -levaquin x7 days for urine and CAP coverage -alternate tylenol and ibuprofen, 1000 mg of tylenol and 800 mg of ibuprofen given here -push fluids -1/2 tablet of diovan to prevent dehydration and kidney damage. -patient aware  to go to the ER if intractable nausea, vomiting, SOB, flank pain, confusion or any other concerning symptoms.

## 2016-04-11 ENCOUNTER — Telehealth: Payer: Self-pay | Admitting: *Deleted

## 2016-04-11 ENCOUNTER — Other Ambulatory Visit: Payer: Self-pay | Admitting: Internal Medicine

## 2016-04-11 MED ORDER — PREDNISONE 20 MG PO TABS: ORAL_TABLET | ORAL | 0 refills | Status: DC

## 2016-04-11 MED FILL — PRAMIPEXOLE 0.125 MG TABLET: 0.125 | 30 days supply | Qty: 180 | Fill #0

## 2016-04-11 MED FILL — predniSONE 20 MG TABS: 20 | 15 days supply | Qty: 27 | Fill #0

## 2016-04-11 NOTE — Progress Notes (Signed)
Patient called this morning reporting that he has been covered in hives.  He believes that he is having a reaction to the ceftriaxone.  He  Has taken benadryl this morning.  He took 50 mg.  I will have him continue to do this every 6-8 hours until hives disappear.  Will also send in prednisone taper.  Will add ceftriaxone to allergy list.

## 2016-04-11 NOTE — Telephone Encounter (Signed)
Patient called with concerns about hives reaction to Rocephin shot he received yesterday.  Patient states he woke up this morning with hives in multiple areas.  Per [name], PA-C orders, patient was advised to take Benadryl 50 mg q 6-8 hours and Prednisone dose pack was sent into the pharmacy for him.  Advised patient to continue Levaquin d/t chest Xray results and advised him to call with any further questions/concerns, or if no relief or increase in symptoms. Patient expressed understanding.

## 2016-04-13 ENCOUNTER — Ambulatory Visit: Payer: 59 | Admitting: Physical Therapy

## 2016-04-13 ENCOUNTER — Encounter: Payer: 59 | Admitting: Occupational Therapy

## 2016-04-13 ENCOUNTER — Encounter: Payer: 59 | Admitting: *Deleted

## 2016-04-14 ENCOUNTER — Ambulatory Visit: Payer: 59 | Admitting: Physical Therapy

## 2016-04-14 ENCOUNTER — Encounter: Payer: 59 | Admitting: Occupational Therapy

## 2016-04-14 ENCOUNTER — Encounter: Payer: 59 | Admitting: Speech Pathology

## 2016-04-14 LAB — URINE CULTURE

## 2016-04-16 ENCOUNTER — Other Ambulatory Visit: Payer: Self-pay | Admitting: Internal Medicine

## 2016-04-17 ENCOUNTER — Other Ambulatory Visit: Payer: Self-pay | Admitting: Internal Medicine

## 2016-04-17 DIAGNOSIS — Z7689 Persons encountering health services in other specified circumstances: Secondary | ICD-10-CM | POA: Diagnosis not present

## 2016-04-17 DIAGNOSIS — R509 Fever, unspecified: Secondary | ICD-10-CM | POA: Diagnosis not present

## 2016-04-17 DIAGNOSIS — B965 Pseudomonas (aeruginosa) (mallei) (pseudomallei) as the cause of diseases classified elsewhere: Secondary | ICD-10-CM | POA: Diagnosis not present

## 2016-04-17 DIAGNOSIS — A419 Sepsis, unspecified organism: Secondary | ICD-10-CM | POA: Diagnosis not present

## 2016-04-18 ENCOUNTER — Ambulatory Visit: Payer: 59 | Admitting: Physical Therapy

## 2016-04-18 ENCOUNTER — Encounter: Payer: 59 | Admitting: Occupational Therapy

## 2016-04-18 DIAGNOSIS — N200 Calculus of kidney: Secondary | ICD-10-CM | POA: Diagnosis not present

## 2016-04-18 DIAGNOSIS — N3020 Other chronic cystitis without hematuria: Secondary | ICD-10-CM | POA: Diagnosis not present

## 2016-04-19 DIAGNOSIS — G4733 Obstructive sleep apnea (adult) (pediatric): Secondary | ICD-10-CM | POA: Diagnosis not present

## 2016-04-20 ENCOUNTER — Ambulatory Visit: Payer: 59 | Admitting: Physical Therapy

## 2016-04-20 ENCOUNTER — Ambulatory Visit: Payer: 59 | Attending: Neurology | Admitting: Occupational Therapy

## 2016-04-20 ENCOUNTER — Ambulatory Visit: Payer: 59

## 2016-04-20 DIAGNOSIS — R2689 Other abnormalities of gait and mobility: Secondary | ICD-10-CM | POA: Insufficient documentation

## 2016-04-20 DIAGNOSIS — R471 Dysarthria and anarthria: Secondary | ICD-10-CM | POA: Diagnosis not present

## 2016-04-20 DIAGNOSIS — R2681 Unsteadiness on feet: Secondary | ICD-10-CM | POA: Diagnosis not present

## 2016-04-20 DIAGNOSIS — R29818 Other symptoms and signs involving the nervous system: Secondary | ICD-10-CM | POA: Insufficient documentation

## 2016-04-20 DIAGNOSIS — R293 Abnormal posture: Secondary | ICD-10-CM | POA: Insufficient documentation

## 2016-04-20 DIAGNOSIS — R29898 Other symptoms and signs involving the musculoskeletal system: Secondary | ICD-10-CM | POA: Insufficient documentation

## 2016-04-20 DIAGNOSIS — R278 Other lack of coordination: Secondary | ICD-10-CM | POA: Diagnosis not present

## 2016-04-20 NOTE — Therapy (Signed)
[facility] [address] [place], [place], [address] Phone: [phone]   Fax:  [phone]  Occupational Therapy Treatment  Patient Details  Name: Todd Rice MRN: [id number] Date of Birth: Dec 19, 1955 Referring Provider: Dr. [name]  Encounter Date: 04/20/2016      OT End of Session - 04/20/16 1726    Visit Number 10   Number of Visits 17   Date for OT Re-Evaluation 05/17/16  extended due to missed visits   Authorization Type [facility] [employer]   OT Start Time 1448   OT Stop Time 1530   OT Time Calculation (min) 42 min      Past Medical History:  Diagnosis Date  . Adult BMI 50.0-59.9 kg/sq m    52.77  . Anal fissure   . Anxiety   . Diabetes mellitus without complication (HCC)    borderline  . GERD (gastroesophageal reflux disease)    30 years ago, maybe from peptic ulcer  . H/O: gout    STABLE  . Hepatic steatosis 10/01/11  . History of kidney stones   . Hyperlipidemia   . Hypertension   . Kidney tumor    right upper kidney tumor, monitoring  . OSA on CPAP   . Parkinson disease ([place]) 01/04/2016  . Right ureteral stone   . Vitamin D deficiency   . Weakness     Past Surgical History:  Procedure Laterality Date  . ANAL FISSURE REPAIR  10-12-2000  . COLONOSCOPY N/A 07/01/2012   Procedure: COLONOSCOPY;  Surgeon: Lafayette Dragon, MD;  Location: [facility];  Service: Endoscopy;  Laterality: N/A;  . CYSTOSCOPY WITH RETROGRADE PYELOGRAM, URETEROSCOPY AND STENT PLACEMENT Bilateral 02/26/2013   Procedure: CYSTOSCOPY WITH RETROGRADE PYELOGRAM, URETEROSCOPY AND STENT PLACEMENT;  Surgeon: Alexis Frock, MD;  Location: [facility];  Service: Urology;  Laterality: Bilateral;  . HOLMIUM LASER APPLICATION Bilateral [id number]   Procedure: HOLMIUM LASER APPLICATION;  Surgeon: Alexis Frock, MD;  Location: [facility];  Service: Urology;  Laterality: Bilateral;  . kidney stone removal     06/2011-stent placement  . LEFT MEDIAL  FEMORAL CONDYLE DEBRIDEMENT & DRILLING/ REMOVAL LOOSE BODY  09-15-2003  . NASAL SEPTUM SURGERY  1985  . STONE EXTRACTION WITH BASKET  06/12/2011   Procedure: STONE EXTRACTION WITH BASKET;  Surgeon: Claybon Jabs, MD;  Location: [facility];  Service: Urology;  Laterality: Right;    There were no vitals filed for this visit.            Therapist  checked progress towards goals.  Ambulating while dual tasking (toss scarf and category generation) min difficulty,  Grooved pegs for incr. Fine motor coordination, min-mod difficulty for in hand manipulation and removing with PWR! Step.           [facility]) - 04/20/16 1727    PWR! exercises Moves in [place]! Up x10   PWR! Rock x10   PWR! Twist x10   PWR! Step x10   Comments modified quadraped , min v.c.               OT Short Term Goals - 04/20/16 1501      OT SHORT TERM GOAL #1   Title I with PD  specific HEP.   Time 4   Period Weeks   Status Achieved     OT SHORT TERM GOAL #2   Title I with adapted strategies for ADLs/ IADLs.   Time 4   Period Weeks  Status Achieved     OT SHORT TERM GOAL #3   Title Pt will demonstrate ability to write a sentence with 95% legibility, good letter size with only minimal decrease in letter size.   Time 4   Status Achieved     OT SHORT TERM GOAL #4   Title Pt will demonstrate improved RUE functional use as evidenced by increasing box/ blocks score to 43 blocks or greater.   Status Achieved  45 blocks     OT SHORT TERM GOAL #5   Title Pt will increase bilateral standing functional reach to 10 inches or greater bilaterally for decreased fall risk   Baseline RUE 8.5, LUE 9 inches   Time 4   Status Achieved  RUE 10.5 inches, LUE 10 inch           OT Long Term Goals - 03/15/16 1704      OT LONG TERM GOAL #1   Title Pt will verbalize understanding of community resources and ways to prevent future PD related  complications.   Time 8   Period  Weeks   Status New     OT LONG TERM GOAL #2   Title Pt will demonstrate improved fine motor coordination as evidenced by decreasing RUE 9 hole peg test to 35 secs or less.   Baseline RUE 39.81 secs, LUE 30.72 secs   Time 8   Period Weeks   Status New     OT LONG TERM GOAL #3   Title Pt will demonstrate improved ability to fasten buttons as evidenced by decreasing 3 button/ unbutton test to 45 secs or less.   Time 8   Period Weeks   Status New     OT LONG TERM GOAL #4   Title Pt will verbalize understanding of ways to keep thinking skills sharp and ways to compensate for memory changes PRN..   Time 8   Period Weeks   Status Achieved     OT LONG TERM GOAL #5   Title Pt will report UE pain no greater than 3/10 for ADLs/IADLs.   Time 8   Period Weeks   Status New               Plan - 04/20/16 1724    Clinical Impression Statement Pt demonstrates good progress towards goals despite missing visits due to illness. Anticipate d/c in the next 3 visits.   Rehab Potential Good   OT Frequency 2x / week   OT Duration 8 weeks   OT Treatment/Interventions Self-care/ADL training;Moist Heat;Fluidtherapy;DME and/or AE instruction;Splinting;Patient/family education;Balance training;Therapeutic exercises;Gait Training;Contrast Bath;Ultrasound;Therapeutic exercise;Therapeutic activities;Cognitive remediation/compensation;Passive range of motion;Functional Mobility Training;Neuromuscular education;Cryotherapy;Electrical Stimulation;Parrafin;Energy conservation;Manual Therapy;Visual/perceptual remediation/compensation   Plan work towards and check long term goals, anticpate d/c in next several visits, schedule eval in 6 mons.   OT Home Exercise Plan issued PWR! seated quadraped and coordination HEP, ways to prevent future PD related complications, big movements with ADLs handout   Consulted and Agree with Plan of Care Patient;Family member/caregiver      Patient will benefit from skilled  therapeutic intervention in order to improve the following deficits and impairments:  Abnormal gait, Decreased range of motion, Difficulty walking, Impaired flexibility, Decreased safety awareness, Decreased endurance, Decreased activity tolerance, Decreased knowledge of precautions, Impaired tone, Impaired UE functional use, Pain, Decreased balance, Decreased cognition, Decreased mobility, Decreased strength  Visit Diagnosis: Other symptoms and signs involving the nervous system  Other symptoms and signs involving the musculoskeletal system  Other lack of coordination  Abnormal posture    Problem List Patient Active Problem List   Diagnosis Date Noted  . Parkinson disease ([place]) 01/04/2016  . Seasonal and perennial allergic rhinitis 06/21/2014  . Asthma, mild intermittent, well-controlled 06/21/2014  . Pseudomonas aeruginosa colonization 10/08/2012  . Hemorrhage of rectum and anus 07/01/2012  . CAD (coronary artery disease) 11/15/2011  . Hypertension 11/15/2011  . Hyperlipidemia 11/15/2011  . Obesity 11/15/2011  . Ureteral calculus, right 06/12/2011    Alylah Blakney 04/20/2016, 5:30 PM  [facility] [address] [place], [place], [address] Phone: [phone]   Fax:  [phone]  Name: SHAYLAN VOSE MRN: [id number] Date of Birth: [birth year]

## 2016-04-20 NOTE — Therapy (Signed)
[facility] [address] [place], [place], [address] Phone: [phone]   Fax:  [phone]  Speech Language Pathology Treatment  Patient Details  Name: Todd Rice MRN: [id number] Date of Birth: Sep 17, 1955 Referring Provider: Neldon Newport MD  Encounter Date: 04/20/2016      End of Session - 04/20/16 1439    Visit Number 5   Number of Visits 16   Date for SLP Re-Evaluation 05/19/16  extenended due to pt missing approx 2 weeks   SLP Start Time 1403   SLP Stop Time  [id number]  ended early due to scratchy throat   SLP Time Calculation (min) 35 min   Activity Tolerance --  scratchy throat at end      Past Medical History:  Diagnosis Date  . Adult BMI 50.0-59.9 kg/sq m    52.77  . Anal fissure   . Anxiety   . Diabetes mellitus without complication (HCC)    borderline  . GERD (gastroesophageal reflux disease)    30 years ago, maybe from peptic ulcer  . H/O: gout    STABLE  . Hepatic steatosis 10/01/11  . History of kidney stones   . Hyperlipidemia   . Hypertension   . Kidney tumor    right upper kidney tumor, monitoring  . OSA on CPAP   . Parkinson disease ([place]) 01/04/2016  . Right ureteral stone   . Vitamin D deficiency   . Weakness     Past Surgical History:  Procedure Laterality Date  . ANAL FISSURE REPAIR  10-12-2000  . COLONOSCOPY N/A 07/01/2012   Procedure: COLONOSCOPY;  Surgeon: Lafayette Dragon, MD;  Location: [facility];  Service: Endoscopy;  Laterality: N/A;  . CYSTOSCOPY WITH RETROGRADE PYELOGRAM, URETEROSCOPY AND STENT PLACEMENT Bilateral 02/26/2013   Procedure: CYSTOSCOPY WITH RETROGRADE PYELOGRAM, URETEROSCOPY AND STENT PLACEMENT;  Surgeon: Alexis Frock, MD;  Location: [facility];  Service: Urology;  Laterality: Bilateral;  . HOLMIUM LASER APPLICATION Bilateral [id number]   Procedure: HOLMIUM LASER APPLICATION;  Surgeon: Alexis Frock, MD;  Location: [facility];  Service: Urology;  Laterality:  Bilateral;  . kidney stone removal     06/2011-stent placement  . LEFT MEDIAL FEMORAL CONDYLE DEBRIDEMENT & DRILLING/ REMOVAL LOOSE BODY  09-15-2003  . NASAL SEPTUM SURGERY  1985  . STONE EXTRACTION WITH BASKET  06/12/2011   Procedure: STONE EXTRACTION WITH BASKET;  Surgeon: Claybon Jabs, MD;  Location: [facility];  Service: Urology;  Laterality: Right;    There were no vitals filed for this visit.      Subjective Assessment - 04/20/16 1415    Subjective Pt just getting over PNA, with scratchy voice.   Patient is accompained by: Family member  wife               ADULT SLP TREATMENT - 04/20/16 1416      General Information   Behavior/Cognition Alert;Cooperative;Pleasant mood     Treatment Provided   Treatment provided Cognitive-Linquistic     Pain Assessment   Pain Assessment No/denies pain     Cognitive-Linquistic Treatment   Treatment focused on Dysarthria   Skilled Treatment Recalibrated WNL volume with loud /a/ average of 95dB independently with scratchy voice. Structured speech tasks with average 72dB with sentence responses. Simple conversation average 70dB with occasional min A.      Assessment / Recommendations / Plan   Plan Continue with current plan of care     Progression Toward Goals   Progression toward  goals Progressing toward goals            SLP Short Term Goals - 04/20/16 1441      SLP SHORT TERM GOAL #1   Title pt will maintain loud /a/ average 94dB with appropriate voicing (<15% strained/strangled voice) over 4 sessions   Baseline 04-20-16   Time 1   Period Weeks   Status On-going     SLP SHORT TERM GOAL #2   Title pt will produce 19/20 sentences with average 70dB over two sessions   Baseline 04-20-16   Time 1   Period Weeks   Status On-going     SLP SHORT TERM GOAL #3   Title pt will demo simple conversation of 8 minutes with average 70dB over three sessions   Time 1   Period Weeks   Status On-going     SLP  SHORT TERM GOAL #4   Title pt will exhibit loud "Hey!" with average 95dB over 3 sessions   Time --   Period --   Status Deferred  Pt with loud /a/          SLP Long Term Goals - 04/20/16 1454      SLP LONG TERM GOAL #1   Title pt will maintain loud /a/ at 95dB with proper voicing over 5 sessions   Baseline 04-20-16   Time 5   Period Weeks   Status On-going     SLP LONG TERM GOAL #2   Title pt will demo 10 minutes mod complex conversastion with average 70dB over three sessions   Time 5   Period Weeks   Status On-going          Plan - 04/20/16 1440    Clinical Impression Statement Pt required rare min A to maintain WNL volume of 70dB in 5 minutes conversation. In structured theapy tasks pt cont'd to maintain WNL volume consistently. Continue skilled ST to maximize intellgilbility.   Speech Therapy Frequency 2x / week   Duration --  8 weeks/16 visits   Treatment/Interventions Compensatory strategies;Patient/family education;Functional tasks;Cueing hierarchy;SLP instruction and feedback;Internal/external aids   Potential to Achieve Goals Good      Patient will benefit from skilled therapeutic intervention in order to improve the following deficits and impairments:   Dysarthria and anarthria    Problem List Patient Active Problem List   Diagnosis Date Noted  . Parkinson disease ([place]) 01/04/2016  . Seasonal and perennial allergic rhinitis 06/21/2014  . Asthma, mild intermittent, well-controlled 06/21/2014  . Pseudomonas aeruginosa colonization 10/08/2012  . Hemorrhage of rectum and anus 07/01/2012  . CAD (coronary artery disease) 11/15/2011  . Hypertension 11/15/2011  . Hyperlipidemia 11/15/2011  . Obesity 11/15/2011  . Ureteral calculus, right 06/12/2011    [facility] ,MS, CCC-SLP  04/20/2016, 2:55 PM  [facility] [address] [place], [place], [address] Phone: [phone]   Fax:   [phone]   Name: Todd Rice MRN: [id number] Date of Birth: 1956-01-12

## 2016-04-20 NOTE — Patient Instructions (Signed)
-  SIT TO STAND followed by PWR! UP x 10 reps  -SIT to STAND followed by PWR! ROCK x 10 reps  -SIT to STAND followed by PWR! TWIST x 10 reps  -SIT to STAND followed by PWR! STEP x 10 reps

## 2016-04-21 LAB — URINE CULTURE: Organism ID, Bacteria: NO GROWTH

## 2016-04-21 LAB — SPECIMEN STATUS REPORT

## 2016-04-21 LAB — LACTIC ACID, PLASMA: Lactate, Ven: 36 mg/dL (ref 4.8–25.7)

## 2016-04-21 NOTE — Therapy (Signed)
[facility] [address] [place], [place], [address] Phone: [phone]   Fax:  [phone]  Physical Therapy Treatment  Patient Details  Name: Todd [facility] MRN: [id number] Date of Birth: 11-03-1955 Referring Provider: [name]  Encounter Date: 04/20/2016      PT End of Session - 04/21/16 1115    Visit Number 7   Number of Visits 13   Date for PT Re-Evaluation 04/18/16   Authorization Type UMR   PT Start Time 1314   PT Stop Time 1358   PT Time Calculation (min) 44 min   Activity Tolerance Patient tolerated treatment well   Behavior During Therapy [facility] for tasks assessed/performed      Past Medical History:  Diagnosis Date  . Adult BMI 50.0-59.9 kg/sq m    52.77  . Anal fissure   . Anxiety   . Diabetes mellitus without complication (HCC)    borderline  . GERD (gastroesophageal reflux disease)    30 years ago, maybe from peptic ulcer  . H/O: gout    STABLE  . Hepatic steatosis 10/01/11  . History of kidney stones   . Hyperlipidemia   . Hypertension   . Kidney tumor    right upper kidney tumor, monitoring  . OSA on CPAP   . Parkinson disease ([place]) 01/04/2016  . Right ureteral stone   . Vitamin D deficiency   . Weakness     Past Surgical History:  Procedure Laterality Date  . ANAL FISSURE REPAIR  10-12-2000  . COLONOSCOPY N/A 07/01/2012   Procedure: COLONOSCOPY;  Surgeon: Lafayette Dragon, MD;  [facility];  Service: Endoscopy;  Laterality: N/A;  . CYSTOSCOPY WITH RETROGRADE PYELOGRAM, URETEROSCOPY AND STENT PLACEMENT Bilateral 02/26/2013   Procedure: CYSTOSCOPY WITH RETROGRADE PYELOGRAM, URETEROSCOPY AND STENT PLACEMENT;  Surgeon: Alexis Frock, MD;  [facility]: [facility];  Service: Urology;  Laterality: Bilateral;  . HOLMIUM LASER APPLICATION Bilateral [id number]   Procedure: HOLMIUM LASER APPLICATION;  Surgeon: Alexis Frock, MD;  Location: [facility];  Service: Urology;  Laterality: Bilateral;   . kidney stone removal     06/2011-stent placement  . LEFT MEDIAL FEMORAL CONDYLE DEBRIDEMENT & DRILLING/ REMOVAL LOOSE BODY  09-15-2003  . NASAL SEPTUM SURGERY  1985  . STONE EXTRACTION WITH BASKET  06/12/2011   Procedure: STONE EXTRACTION WITH BASKET;  Surgeon: Claybon Jabs, MD;  Location: [facility];  Service: Urology;  Laterality: Right;    There were no vitals filed for this visit.      Subjective Assessment - 04/20/16 1318    Subjective Had pneumonia, and am just getting over that.  I feel like I'm back to 80%.  "It's been a rough winter"   Patient is accompained by: Family member  wife   Patient Stated Goals Pt's goal for physical therapy to is regain normal use of R arm and keep from falling.   Currently in Pain? No/denies                         [facility] Adult PT Treatment/Exercise - 04/21/16 1120      Ambulation/Gait   Ambulation/Gait Yes   Ambulation/Gait Assistance 5: Supervision   Ambulation Distance (Feet) 600 Feet  then 500 ft   Assistive device None   Gait Pattern Step-through pattern;Decreased arm swing - right;Decreased step length - right;Decreased stance time - right;Decreased trunk rotation   Ambulation Surface Level;Indoor;Unlevel  Curb, ramp indoors   Ramp 7:  Independent   Curb 7: Independent   Pre-Gait Activities Head turns>side step and turn   Gait Comments Gait activities with environmental scanning tasks, no LOB.  Negotiating quick turns, change of directions no LOB           PWR [facility]) - 04/21/16 0808    PWR! exercises Moves in standing   PWR! Up Sit<>stand then PWR! Up x 10   PWR! Rock Sit<>stand then [employer]! Rock x 10   PWR! Twist Sit<>stand then PWR! Twist x 10   PWR Step Sit<>stand then PWR! Step x 10   Comments Initial cues for technique and intensity          Balance Exercises - 04/20/16 1338      Balance Exercises: Standing   SLS Eyes open;Solid surface;Intermittent upper extremity support;2  reps;10 secs   Step Ups Forward;Lateral;6 inch;Intermittent UE support  x 10 reps each   Heel Raises Limitations 2 sets x 10 bilateral  single limb heel raises   Toe Raise Limitations 2 sets x 10 reps bilateral   Other Standing Exercises Single limb standing exercises alternating step taps to 6" step x 15 reps, then to 12" step x 15 reps without UE support           PT Education - 04/21/16 0814    Education provided Yes   Education Details Additions of sit<>stand then [employer]! Moves   Person(s) Educated Patient;Spouse   Methods Explanation;Demonstration;Handout   Comprehension Verbalized understanding;Returned demonstration          PT Short Term Goals - 03/23/16 1324      PT SHORT TERM GOAL #1   Title Pt will be independent with HEP for improved balance, transfers, gait.  TARGET 03/19/16   Time 4   Period Weeks   Status Achieved     PT SHORT TERM GOAL #2   Title Pt will verbalize understanding of local community Parkinson's related resources.   Time 4   Period Weeks   Status Achieved     PT SHORT TERM GOAL #3   Title Pt will improve 5x sit<>stand to less than or equal to 11.5 seconds for improved efficiency and safety with gait.   Baseline 11.39 sec 03/23/16   Time 4   Period Weeks   Status Achieved           PT Long Term Goals - 02/18/16 1245      PT LONG TERM GOAL #1   Title Pt will verbalize understanding of fall prevention in home environment.  TARGET 04/18/16   Time 8   Period Weeks   Status New     PT LONG TERM GOAL #2   Title Pt will perform at least 8 of 10 reps of sit<>stand transfers from lower than 18" surfaces, independently, for improved ease of transfers.   Time 8   Period Weeks   Status New     PT LONG TERM GOAL #3   Title Pt will improve TUG and TUG cognitive to <10% difference in times, for improved dual tasking with gait.   Time 8   Period Weeks   Status New     PT LONG TERM GOAL #4   Title Pt will improve 4-square step test to  less than or equal to 11 seconds with no LOB, for improved balance with change of directions.   Time 8   Period Weeks   Status New     PT LONG TERM GOAL #5   Title Pt  will verbalize plans for continued community fitness upon D/C from PT.   Time 8   Period Weeks   Status New               Plan - 04/21/16 1116    Clinical Impression Statement Pt has missed several weeks of appointments due to illness.  Pt reports improvements with less veering during gait, as he is utilizing strategy for head turns then body turns with gait.  Pt continues to demonstrate good performance of PWR! Moves with various progressions (today in combination with sit<>stand).  May look at assessing LTGs with possible d/c next 1-2 visits.   Rehab Potential Good   PT Frequency 2x / week  1x/wk for 1 wk, then   PT Duration 8 weeks   PT Treatment/Interventions ADLs/Self Care Home Management;Functional mobility training;Gait training;Therapeutic activities;Therapeutic exercise;Balance training;Neuromuscular re-education;Patient/family education   PT Next Visit Plan gait with walking poles, dynamic SLS activities; Check LTGs and discuss d/c   Consulted and Agree with Plan of Care Patient;Family member/caregiver   Family Member Consulted wife      Patient will benefit from skilled therapeutic intervention in order to improve the following deficits and impairments:  Abnormal gait, Decreased balance, Decreased mobility, Difficulty walking, Impaired flexibility, Postural dysfunction  Visit Diagnosis: Other abnormalities of gait and mobility  Other symptoms and signs involving the nervous system     Problem List Patient Active Problem List   Diagnosis Date Noted  . Parkinson disease ([place]) 01/04/2016  . Seasonal and perennial allergic rhinitis 06/21/2014  . Asthma, mild intermittent, well-controlled 06/21/2014  . Pseudomonas aeruginosa colonization 10/08/2012  . Hemorrhage of rectum and anus 07/01/2012  .  CAD (coronary artery disease) 11/15/2011  . Hypertension 11/15/2011  . Hyperlipidemia 11/15/2011  . Obesity 11/15/2011  . Ureteral calculus, right 06/12/2011    Frazier Butt. 04/21/2016, 11:22 AM  Frazier Butt., PT  [facility] [address] [place], [place], [address] Phone: [phone]   Fax:  [phone]  Name: Todd [facility] MRN: [id number] Date of Birth: [birth year]

## 2016-04-24 ENCOUNTER — Other Ambulatory Visit: Payer: Self-pay | Admitting: Internal Medicine

## 2016-04-24 DIAGNOSIS — B965 Pseudomonas (aeruginosa) (mallei) (pseudomallei) as the cause of diseases classified elsewhere: Secondary | ICD-10-CM | POA: Diagnosis not present

## 2016-04-24 DIAGNOSIS — A419 Sepsis, unspecified organism: Secondary | ICD-10-CM | POA: Diagnosis not present

## 2016-04-24 LAB — CULTURE, BLOOD (SINGLE)

## 2016-04-25 ENCOUNTER — Ambulatory Visit: Payer: 59

## 2016-04-25 DIAGNOSIS — R2689 Other abnormalities of gait and mobility: Secondary | ICD-10-CM | POA: Diagnosis not present

## 2016-04-25 DIAGNOSIS — R29898 Other symptoms and signs involving the musculoskeletal system: Secondary | ICD-10-CM | POA: Diagnosis not present

## 2016-04-25 DIAGNOSIS — R29818 Other symptoms and signs involving the nervous system: Secondary | ICD-10-CM | POA: Diagnosis not present

## 2016-04-25 DIAGNOSIS — R471 Dysarthria and anarthria: Secondary | ICD-10-CM | POA: Diagnosis not present

## 2016-04-25 DIAGNOSIS — R2681 Unsteadiness on feet: Secondary | ICD-10-CM | POA: Diagnosis not present

## 2016-04-25 DIAGNOSIS — R293 Abnormal posture: Secondary | ICD-10-CM | POA: Diagnosis not present

## 2016-04-25 DIAGNOSIS — R278 Other lack of coordination: Secondary | ICD-10-CM | POA: Diagnosis not present

## 2016-04-25 NOTE — Therapy (Signed)
[facility] [address] [place], [place], [address] Phone: [phone]   Fax:  [phone]  Speech Language Pathology Treatment  Patient Details  Name: Todd BRAND MRN: [id number] Date of Birth: July 16, 1955 Referring Provider: Neldon Newport MD  Encounter Date: 04/25/2016      End of Session - 04/25/16 1445    Visit Number 6   Number of Visits 16   Date for SLP Re-Evaluation 05/19/16   SLP Start Time 1404   SLP Stop Time  1444   SLP Time Calculation (min) 40 min   Activity Tolerance Patient tolerated treatment well      Past Medical History:  Diagnosis Date  . Adult BMI 50.0-59.9 kg/sq m    52.77  . Anal fissure   . Anxiety   . Diabetes mellitus without complication (HCC)    borderline  . GERD (gastroesophageal reflux disease)    30 years ago, maybe from peptic ulcer  . H/O: gout    STABLE  . Hepatic steatosis 10/01/11  . History of kidney stones   . Hyperlipidemia   . Hypertension   . Kidney tumor    right upper kidney tumor, monitoring  . OSA on CPAP   . Parkinson disease ([place]) 01/04/2016  . Right ureteral stone   . Vitamin D deficiency   . Weakness     Past Surgical History:  Procedure Laterality Date  . ANAL FISSURE REPAIR  10-12-2000  . COLONOSCOPY N/A 07/01/2012   Procedure: COLONOSCOPY;  Surgeon: Lafayette Dragon, MD;  [facility];  Service: Endoscopy;  Laterality: N/A;  . CYSTOSCOPY WITH RETROGRADE PYELOGRAM, URETEROSCOPY AND STENT PLACEMENT Bilateral 02/26/2013   Procedure: CYSTOSCOPY WITH RETROGRADE PYELOGRAM, URETEROSCOPY AND STENT PLACEMENT;  Surgeon: Alexis Frock, MD;  [facility];  Service: Urology;  Laterality: Bilateral;  . HOLMIUM LASER APPLICATION Bilateral [id number]   Procedure: HOLMIUM LASER APPLICATION;  Surgeon: Alexis Frock, MD;  [facility];  Service: Urology;  Laterality: Bilateral;  . kidney stone removal     06/2011-stent placement  . LEFT MEDIAL  FEMORAL CONDYLE DEBRIDEMENT & DRILLING/ REMOVAL LOOSE BODY  09-15-2003  . NASAL SEPTUM SURGERY  1985  . STONE EXTRACTION WITH BASKET  06/12/2011   Procedure: STONE EXTRACTION WITH BASKET;  Surgeon: Claybon Jabs, MD;  Location: [facility];  Service: Urology;  Laterality: Right;    There were no vitals filed for this visit.      Subjective Assessment - 04/25/16 1410    Subjective Pt's voice sounds WNL.   Currently in Pain? No/denies               ADULT SLP TREATMENT - 04/25/16 1418      General Information   Behavior/Cognition Alert;Cooperative;Pleasant mood     Treatment Provided   Treatment provided Cognitive-Linquistic     Cognitive-Linquistic Treatment   Treatment focused on Dysarthria   Skilled Treatment Recalibrated WNL volume with loud /a/ average of 93dB independently. Structured speech tasks (multi-sentences/short answer) with average 71dB with sentence responses. Simple conversation average 70dB with occasional min A. Walking around the clinic pt produced speech with 100% intelligibilty in a mod noisy environment.     Assessment / Recommendations / Plan   Plan Continue with current plan of care     Progression Toward Goals   Progression toward goals Progressing toward goals            SLP Short Term Goals - 04/25/16 1446  SLP SHORT TERM GOAL #1   Title pt will maintain loud /a/ average 94dB with appropriate voicing (<15% strained/strangled voice) over 4 sessions   Baseline 04-20-16   Time 1   Period Weeks   Status On-going     SLP SHORT TERM GOAL #2   Title pt will produce 19/20 sentences with average 70dB over two sessions   Status Achieved     SLP SHORT TERM GOAL #3   Title pt will demo simple conversation of 8 minutes with average 70dB over three sessions   Baseline 04-25-16   Time 1   Period Weeks   Status On-going     SLP SHORT TERM GOAL #4   Title pt will exhibit loud "Hey!" with average 95dB over 3 sessions    Status Deferred  Pt with loud /a/          SLP Long Term Goals - 04/25/16 1447      SLP LONG TERM GOAL #1   Title pt will maintain loud /a/ at 95dB with proper voicing over 5 sessions   Baseline 04-20-16   Time 4   Period Weeks   Status On-going     SLP LONG TERM GOAL #2   Title pt will demo 10 minutes mod complex conversastion with average 70dB over three sessions   Time 4   Period Weeks   Status On-going          Plan - 04/25/16 1445    Clinical Impression Statement Pt did excellent job maintaining WNL volume in 12 minutes simple to mod complex conversation. In structured theapy tasks pt cont'd to maintain WNL volume consistently. Continue skilled ST to maximize intellgilbility.   Speech Therapy Frequency 2x / week   Duration --  8 weeks/16 visits   Treatment/Interventions Compensatory strategies;Patient/family education;Functional tasks;Cueing hierarchy;SLP instruction and feedback;Internal/external aids   Potential to Achieve Goals Good      Patient will benefit from skilled therapeutic intervention in order to improve the following deficits and impairments:   Dysarthria and anarthria    Problem List Patient Active Problem List   Diagnosis Date Noted  . Parkinson disease ([place]) 01/04/2016  . Seasonal and perennial allergic rhinitis 06/21/2014  . Asthma, mild intermittent, well-controlled 06/21/2014  . Pseudomonas aeruginosa colonization 10/08/2012  . Hemorrhage of rectum and anus 07/01/2012  . CAD (coronary artery disease) 11/15/2011  . Hypertension 11/15/2011  . Hyperlipidemia 11/15/2011  . Obesity 11/15/2011  . Ureteral calculus, right 06/12/2011    [facility] ,MS, CCC-SLP  04/25/2016, 2:47 PM  [facility] [address] [place], [place], [address] Phone: [phone]   Fax:  [phone]   Name: Todd HOOEY MRN: [id number] Date of Birth: 1955-12-30

## 2016-04-25 NOTE — Patient Instructions (Signed)
  Please complete the assigned speech therapy homework and return it to your next session.  

## 2016-04-26 ENCOUNTER — Ambulatory Visit: Payer: 59 | Admitting: Physical Therapy

## 2016-04-26 DIAGNOSIS — R293 Abnormal posture: Secondary | ICD-10-CM

## 2016-04-26 DIAGNOSIS — R29898 Other symptoms and signs involving the musculoskeletal system: Secondary | ICD-10-CM | POA: Diagnosis not present

## 2016-04-26 DIAGNOSIS — R2681 Unsteadiness on feet: Secondary | ICD-10-CM | POA: Diagnosis not present

## 2016-04-26 DIAGNOSIS — R2689 Other abnormalities of gait and mobility: Secondary | ICD-10-CM | POA: Diagnosis not present

## 2016-04-26 DIAGNOSIS — R278 Other lack of coordination: Secondary | ICD-10-CM | POA: Diagnosis not present

## 2016-04-26 DIAGNOSIS — R471 Dysarthria and anarthria: Secondary | ICD-10-CM | POA: Diagnosis not present

## 2016-04-26 DIAGNOSIS — R29818 Other symptoms and signs involving the nervous system: Secondary | ICD-10-CM | POA: Diagnosis not present

## 2016-04-27 ENCOUNTER — Ambulatory Visit: Payer: 59 | Admitting: Physical Therapy

## 2016-04-27 ENCOUNTER — Ambulatory Visit: Payer: 59

## 2016-04-27 DIAGNOSIS — R29898 Other symptoms and signs involving the musculoskeletal system: Secondary | ICD-10-CM | POA: Diagnosis not present

## 2016-04-27 DIAGNOSIS — R293 Abnormal posture: Secondary | ICD-10-CM

## 2016-04-27 DIAGNOSIS — R2681 Unsteadiness on feet: Secondary | ICD-10-CM | POA: Diagnosis not present

## 2016-04-27 DIAGNOSIS — R2689 Other abnormalities of gait and mobility: Secondary | ICD-10-CM

## 2016-04-27 DIAGNOSIS — R471 Dysarthria and anarthria: Secondary | ICD-10-CM | POA: Diagnosis not present

## 2016-04-27 DIAGNOSIS — R278 Other lack of coordination: Secondary | ICD-10-CM | POA: Diagnosis not present

## 2016-04-27 DIAGNOSIS — R29818 Other symptoms and signs involving the nervous system: Secondary | ICD-10-CM | POA: Diagnosis not present

## 2016-04-27 LAB — LACTIC ACID, PLASMA: Lactate, Ven: 22.4 mg/dL (ref 4.8–25.7)

## 2016-04-27 NOTE — Therapy (Signed)
[facility] [address] [place], [place], [address] Phone: [phone]   Fax:  [phone]  Physical Therapy Treatment  Patient Details  Name: Todd [place] MRN: [id number] Date of Birth: April 11, 1956 Referring Provider: [name]  Encounter Date: 04/27/2016      PT End of Session - 04/27/16 [id number]    Visit Number 9   Number of Visits 13   Date for PT Re-Evaluation 04/18/16   Authorization Type UMR   PT Start Time 1316   PT Stop Time 1359   PT Time Calculation (min) 43 min   Activity Tolerance Patient tolerated treatment well   Behavior During Therapy [facility] for tasks assessed/performed      Past Medical History:  Diagnosis Date  . Adult BMI 50.0-59.9 kg/sq m    52.77  . Anal fissure   . Anxiety   . Diabetes mellitus without complication (HCC)    borderline  . GERD (gastroesophageal reflux disease)    30 years ago, maybe from peptic ulcer  . H/O: gout    STABLE  . Hepatic steatosis 10/01/11  . History of kidney stones   . Hyperlipidemia   . Hypertension   . Kidney tumor    right upper kidney tumor, monitoring  . OSA on CPAP   . Parkinson disease ([place]) 01/04/2016  . Right ureteral stone   . Vitamin D deficiency   . Weakness     Past Surgical History:  Procedure Laterality Date  . ANAL FISSURE REPAIR  10-12-2000  . COLONOSCOPY N/A 07/01/2012   Procedure: COLONOSCOPY;  Surgeon: Lafayette Dragon, MD;  [facility];  Service: Endoscopy;  Laterality: N/A;  . CYSTOSCOPY WITH RETROGRADE PYELOGRAM, URETEROSCOPY AND STENT PLACEMENT Bilateral 02/26/2013   Procedure: CYSTOSCOPY WITH RETROGRADE PYELOGRAM, URETEROSCOPY AND STENT PLACEMENT;  Surgeon: Alexis Frock, MD;  [facility];  Service: Urology;  Laterality: Bilateral;  . HOLMIUM LASER APPLICATION Bilateral [id number]   Procedure: HOLMIUM LASER APPLICATION;  Surgeon: Alexis Frock, MD;  [facility];  Service: Urology;  Laterality: Bilateral;   . kidney stone removal     06/2011-stent placement  . LEFT MEDIAL FEMORAL CONDYLE DEBRIDEMENT & DRILLING/ REMOVAL LOOSE BODY  09-15-2003  . NASAL SEPTUM SURGERY  1985  . STONE EXTRACTION WITH BASKET  06/12/2011   Procedure: STONE EXTRACTION WITH BASKET;  Surgeon: Claybon Jabs, MD;  Location: [facility];  Service: Urology;  Laterality: Right;    There were no vitals filed for this visit.      Subjective Assessment - 04/27/16 1319    Subjective Blood sugar was a little high today, but has gone down some while I was at work.   Patient Stated Goals Pt's goal for physical therapy to is regain normal use of R arm and keep from falling.   Currently in Pain? Yes   Pain Onset Yesterday      Pt aware of need to monitor blood sugar regularly, and pt has no c/o during therapy.         Neuro Re-education: -Using bilateral walking poles for support and additional coordination for UEs, pt performs standing PWR! Up x 20 reps, standing PWR! Rock x 20 reps, standing PWR! Twist x 20 reps, then standing PWR! Step x 20 reps. -Using bilateral walking poles for UE support-performed forward step and weightshift x 5 reps each leg, then side step and weigthshift 5 reps each leg, then back step and weightshift each leg x 5 reps, with prolonged  hold for increased balance and dynamic stretch.  -Performed activities in forward>R>back>L directions, then reversed (as in 4-square step test), multiple reps with increased difficulty, including increased step height, widened BOS with supervision.           [facility] Adult PT Treatment/Exercise - 04/27/16 1320      Transfers   Transfers Sit to Stand;Stand to Sit   Sit to Stand 6: Modified independent (Device/Increase time);Without upper extremity assist;From chair/3-in-1   Stand to Sit 6: Modified independent (Device/Increase time);Without upper extremity assist;To chair/3-in-1   Number of Reps 10 reps;2 sets  each from 20" and 18" surfaces    Transfer Cueing Initial cues for increased forward lean for improved standing     Ambulation/Gait   Ambulation/Gait Yes   Ambulation/Gait Assistance 6: Modified independent (Device/Increase time);5: Supervision   Ambulation/Gait Assistance Details Outdoor gait using bilateral walking poles, with pt having difficulty sequencing poles today.     Ambulation Distance (Feet) 1500 Feet   Assistive device --  bilateral walking poles   Gait Pattern Step-through pattern;Decreased arm swing - right;Decreased step length - right;Decreased stance time - right;Decreased trunk rotation   Ambulation Surface Level;Indoor;Unlevel;Outdoor   Recruitment consultant with walking poles, with therapist providing frequent cues for gait sequence with poles (pt tends to quickly bring UEs/poles forward, versus pushing them behind him)     Pt needs multiple brief stopping rest breaks with gait to reset pace and sequence of gait.             PT Short Term Goals - 03/23/16 1324      PT SHORT TERM GOAL #1   Title Pt will be independent with HEP for improved balance, transfers, gait.  TARGET 03/19/16   Time 4   Period Weeks   Status Achieved     PT SHORT TERM GOAL #2   Title Pt will verbalize understanding of local community Parkinson's related resources.   Time 4   Period Weeks   Status Achieved     PT SHORT TERM GOAL #3   Title Pt will improve 5x sit<>stand to less than or equal to 11.5 seconds for improved efficiency and safety with gait.   Baseline 11.39 sec 03/23/16   Time 4   Period Weeks   Status Achieved           PT Long Term Goals - 02/18/16 1245      PT LONG TERM GOAL #1   Title Pt will verbalize understanding of fall prevention in home environment.  TARGET 04/18/16   Time 8   Period Weeks   Status New     PT LONG TERM GOAL #2   Title Pt will perform at least 8 of 10 reps of sit<>stand transfers from lower than 18" surfaces, independently, for improved ease of transfers.    Time 8   Period Weeks   Status New     PT LONG TERM GOAL #3   Title Pt will improve TUG and TUG cognitive to <10% difference in times, for improved dual tasking with gait.   Time 8   Period Weeks   Status New     PT LONG TERM GOAL #4   Title Pt will improve 4-square step test to less than or equal to 11 seconds with no LOB, for improved balance with change of directions.   Time 8   Period Weeks   Status New     PT LONG TERM GOAL #5   Title Pt  will verbalize plans for continued community fitness upon D/C from PT.   Time 8   Period Weeks   Status New               Plan - 04/27/16 1404    Clinical Impression Statement Pt frustrated at difficulty with difficulty in sequening walking poles today during outdoor gait; but pt has not yet bought poles for home and has not used in therapy for several weeks.  Reiterated to patient the importance of repetition and practice in novel tasks such as walking with bilateral walking poles.   Rehab Potential Good   PT Frequency 2x / week  1x/wk for 1 wk, then   PT Duration 8 weeks   PT Treatment/Interventions ADLs/Self Care Home Management;Functional mobility training;Gait training;Therapeutic activities;Therapeutic exercise;Balance training;Neuromuscular re-education;Patient/family education   PT Next Visit Plan Review full HEP; gait with walking poles; plan for discharge next week.   Consulted and Agree with Plan of Care Patient;Family member/caregiver   Family Member Consulted wife      Patient will benefit from skilled therapeutic intervention in order to improve the following deficits and impairments:  Abnormal gait, Decreased balance, Decreased mobility, Difficulty walking, Impaired flexibility, Postural dysfunction  Visit Diagnosis: Other abnormalities of gait and mobility  Unsteadiness on feet  Abnormal posture     Problem List Patient Active Problem List   Diagnosis Date Noted  . Parkinson disease ([place]) 01/04/2016   . Seasonal and perennial allergic rhinitis 06/21/2014  . Asthma, mild intermittent, well-controlled 06/21/2014  . Pseudomonas aeruginosa colonization 10/08/2012  . Hemorrhage of rectum and anus 07/01/2012  . CAD (coronary artery disease) 11/15/2011  . Hypertension 11/15/2011  . Hyperlipidemia 11/15/2011  . Obesity 11/15/2011  . Ureteral calculus, right 06/12/2011    Ashima Shrake W. 04/27/2016, 2:11 PM Frazier Butt., PT [facility] [address] [place], [place], [address] Phone: [phone]   Fax:  [phone]  Name: XANG BROADEN MRN: [id number] Date of Birth: 03-13-1956

## 2016-04-27 NOTE — Therapy (Signed)
Todd Rice [address] [place], [place], [address] Phone: [phone]   Fax:  [phone]  Speech Language Pathology Treatment  Patient Details  Name: Todd Rice MRN: [id number] Date of Birth: [birth year] Referring Provider: Neldon Newport MD  Encounter Date: 04/27/2016      End of Session - 04/27/16 1443    Visit Number 7   Number of Visits 16   Date for SLP Re-Evaluation 05/19/16   SLP Start Time 20   SLP Stop Time  1435   SLP Time Calculation (min) 34 min   Activity Tolerance Patient tolerated treatment well      Past Medical History:  Diagnosis Date  . Adult BMI 50.0-59.9 kg/sq m    52.77  . Anal fissure   . Anxiety   . Diabetes mellitus without complication (HCC)    borderline  . GERD (gastroesophageal reflux disease)    30 years ago, maybe from peptic ulcer  . H/O: gout    STABLE  . Hepatic steatosis 10/01/11  . History of kidney stones   . Hyperlipidemia   . Hypertension   . Kidney tumor    right upper kidney tumor, monitoring  . OSA on CPAP   . Parkinson disease ([place]) 01/04/2016  . Right ureteral stone   . Vitamin D deficiency   . Weakness     Past Surgical History:  Procedure Laterality Date  . ANAL FISSURE REPAIR  10-12-2000  . COLONOSCOPY N/A 07/01/2012   Procedure: COLONOSCOPY;  Surgeon: Lafayette Dragon, MD;  [facility];  Service: Endoscopy;  Laterality: N/A;  . CYSTOSCOPY WITH RETROGRADE PYELOGRAM, URETEROSCOPY AND STENT PLACEMENT Bilateral 02/26/2013   Procedure: CYSTOSCOPY WITH RETROGRADE PYELOGRAM, URETEROSCOPY AND STENT PLACEMENT;  Surgeon: Alexis Frock, MD;  [facility]: [facility];  Service: Urology;  Laterality: Bilateral;  . HOLMIUM LASER APPLICATION Bilateral [id number]   Procedure: HOLMIUM LASER APPLICATION;  Surgeon: Alexis Frock, MD;  [facility]: [facility];  Service: Urology;  Laterality: Bilateral;  . kidney stone removal     06/2011-stent placement  . LEFT MEDIAL  FEMORAL CONDYLE DEBRIDEMENT & DRILLING/ REMOVAL LOOSE BODY  09-15-2003  . NASAL SEPTUM SURGERY  1985  . STONE EXTRACTION WITH BASKET  06/12/2011   Procedure: STONE EXTRACTION WITH BASKET;  Surgeon: Claybon Jabs, MD;  Location: [facility];  Service: Urology;  Laterality: Right;    There were no vitals filed for this visit.             ADULT SLP TREATMENT - 04/27/16 1418      General Information   Behavior/Cognition Alert;Cooperative;Pleasant mood     Treatment Provided   Treatment provided Cognitive-Linquistic     Cognitive-Linquistic Treatment   Treatment focused on Dysarthria   Skilled Treatment In loud /a/ pt produced average 94dB. conversation inside [place] room was at average 70dB with SBA. In conversation outside clinic room, pt maintained loud and intelligible voice amid traffic noise and other outdoor noises 100% of the time.     Assessment / Recommendations / Plan   Plan --  reduce to x1/week due to progress     Progression Toward Goals   Progression toward goals Progressing toward goals            SLP Short Term Goals - 04/27/16 1445      SLP SHORT TERM GOAL #1   Title pt will maintain loud /a/ average 94dB with appropriate voicing (<15% strained/strangled voice) over 4 sessions   Baseline  04-20-16, 04-27-16   Time 1   Period Weeks   Status On-going     SLP SHORT TERM GOAL #2   Title pt will produce 19/20 sentences with average 70dB over two sessions   Status Achieved     SLP SHORT TERM GOAL #3   Title pt will demo simple conversation of 8 minutes with average 70dB over three sessions   Baseline 04-25-16, 04-27-16   Time 1   Period Weeks   Status On-going     SLP SHORT TERM GOAL #4   Title pt will exhibit loud "Hey!" with average 95dB over 3 sessions   Status Deferred  Pt with loud /a/          SLP Long Term Goals - 04/27/16 1446      SLP LONG TERM GOAL #1   Title pt will maintain loud /a/ at 95dB with proper voicing over  5 sessions   Baseline 04-20-16   Time 4   Period Weeks   Status On-going     SLP LONG TERM GOAL #2   Title pt will demo 10 minutes mod complex conversastion with average 70dB over three sessions   Baseline 04-27-16   Time 4   Period Weeks   Status On-going          Plan - 04/27/16 1444    Clinical Impression Statement Pt did excellent job maintaining adequate volume in 15 minutes mod complex conversation outdoors. In conversation in the ST room pt cont'd to maintain WNL volume consistently. Continue skilled ST once a week (due to progress) to maximize intellgilbility.   Speech Therapy Frequency 1x /week   Duration --  8 weeks/16 visits   Treatment/Interventions Compensatory strategies;Patient/family education;Functional tasks;Cueing hierarchy;SLP instruction and feedback;Internal/external aids   Potential to Achieve Goals Good      Patient will benefit from skilled therapeutic intervention in order to improve the following deficits and impairments:   Dysarthria and anarthria    Problem List Patient Active Problem List   Diagnosis Date Noted  . Parkinson disease ([place]) 01/04/2016  . Seasonal and perennial allergic rhinitis 06/21/2014  . Asthma, mild intermittent, well-controlled 06/21/2014  . Pseudomonas aeruginosa colonization 10/08/2012  . Hemorrhage of rectum and anus 07/01/2012  . CAD (coronary artery disease) 11/15/2011  . Hypertension 11/15/2011  . Hyperlipidemia 11/15/2011  . Obesity 11/15/2011  . Ureteral calculus, right 06/12/2011    [facility] ,MS, CCC-SLP  04/27/2016, 2:47 PM  [facility] [address] [place], [place], [address] Phone: [phone]   Fax:  [phone]   Name: Todd Rice MRN: [id number] Date of Birth: 01-04-1956

## 2016-04-27 NOTE — Therapy (Signed)
[facility] [address] [place], [place], [address] Phone: [phone]   Fax:  [phone]  Physical Therapy Treatment  Patient Details  Name: Todd Rice MRN: [id number] Date of Birth: 03/09/1956 Referring Provider: [name]  Encounter Date: 04/26/2016      PT End of Session - 04/27/16 0820    Visit Number 8   Number of Visits 13   Date for PT Re-Evaluation 04/18/16   Authorization Type UMR   PT Start Time 1155   PT Stop Time 1231   PT Time Calculation (min) 36 min   Activity Tolerance Patient tolerated treatment well   Behavior During Therapy [facility] for tasks assessed/performed      Past Medical History:  Diagnosis Date  . Adult BMI 50.0-59.9 kg/sq m    52.77  . Anal fissure   . Anxiety   . Diabetes mellitus without complication (HCC)    borderline  . GERD (gastroesophageal reflux disease)    30 years ago, maybe from peptic ulcer  . H/O: gout    STABLE  . Hepatic steatosis 10/01/11  . History of kidney stones   . Hyperlipidemia   . Hypertension   . Kidney tumor    right upper kidney tumor, monitoring  . OSA on CPAP   . Parkinson disease ([place]) 01/04/2016  . Right ureteral stone   . Vitamin D deficiency   . Weakness     Past Surgical History:  Procedure Laterality Date  . ANAL FISSURE REPAIR  10-12-2000  . COLONOSCOPY N/A 07/01/2012   Procedure: COLONOSCOPY;  Surgeon: Lafayette Dragon, MD;  [facility];  Service: Endoscopy;  Laterality: N/A;  . CYSTOSCOPY WITH RETROGRADE PYELOGRAM, URETEROSCOPY AND STENT PLACEMENT Bilateral 02/26/2013   Procedure: CYSTOSCOPY WITH RETROGRADE PYELOGRAM, URETEROSCOPY AND STENT PLACEMENT;  Surgeon: Alexis Frock, MD;  [facility];  Service: Urology;  Laterality: Bilateral;  . HOLMIUM LASER APPLICATION Bilateral [id number]   Procedure: HOLMIUM LASER APPLICATION;  Surgeon: Alexis Frock, MD;  [facility];  Service: Urology;  Laterality: Bilateral;   . kidney stone removal     06/2011-stent placement  . LEFT MEDIAL FEMORAL CONDYLE DEBRIDEMENT & DRILLING/ REMOVAL LOOSE BODY  09-15-2003  . NASAL SEPTUM SURGERY  1985  . STONE EXTRACTION WITH BASKET  06/12/2011   Procedure: STONE EXTRACTION WITH BASKET;  Surgeon: Claybon Jabs, MD;  Location: [facility];  Service: Urology;  Laterality: Right;    There were no vitals filed for this visit.      Subjective Assessment - 04/26/16 1159    Subjective Have had a little back pain-felt like UTI, but it is not.  Have glucose spilling into urine.  Trying to get this worked out.   Patient Stated Goals Pt's goal for physical therapy to is regain normal use of R arm and keep from falling.   Currently in Pain? Yes   Pain Score 3    Pain Location Back   Pain Orientation Left   Pain Descriptors / Indicators Aching   Pain Type Acute pain   Pain Onset Yesterday   Pain Frequency Intermittent   Aggravating Factors  just started yesterday   Pain Relieving Factors unsure what aggravates                         [facility] Adult PT Treatment/Exercise - 04/27/16 0001      Self-Care   Self-Care Other Self-Care Comments   Other Self-Care Comments  Discussed PWR! Moves community PD exercise classes, and provided patient with information.  Pt verbalizes interest in trying to join in January, after therapy is discharged.           PWR [facility]) - 04/26/16 1218    PWR! Up x 10   PWR! Rock x 10 total (5 reps each side)   PWR! Twist x 10 total (5 reps each side)   PWR! Step x 10 reps each side   Comments In prone-PT provides verbal and visual cues for technique and intensity of movement          Balance Exercises - 04/27/16 0823      Balance Exercises: Standing   Step Ups Forward;Lateral;6 inch;Intermittent UE support;UE support 1  12", 15 reps each side   Sidestepping 3 reps;Other (comment)  Length of counter, side-step squats   Marching Limitations Marching in place 2  sets x 10 reps, with cues for high step, increased intensity   Heel Raises Limitations 2 sets x 10 bilateral  2 sets x 10, single limb heel raises   Toe Raise Limitations 2 sets x 10 reps bilateral   Other Standing Exercises Single limb standing exercises alternating step taps to 6" step x 15 reps, then to 12" step x 15 reps without UE support           PT Education - 04/27/16 0820    Education provided Yes   Education Details Provided information on PWR! Moves community PD exercise class   Person(s) Educated Patient   Methods Explanation;Handout   Comprehension Verbalized understanding          PT Short Term Goals - 03/23/16 1324      PT SHORT TERM GOAL #1   Title Pt will be independent with HEP for improved balance, transfers, gait.  TARGET 03/19/16   Time 4   Period Weeks   Status Achieved     PT SHORT TERM GOAL #2   Title Pt will verbalize understanding of local community Parkinson's related resources.   Time 4   Period Weeks   Status Achieved     PT SHORT TERM GOAL #3   Title Pt will improve 5x sit<>stand to less than or equal to 11.5 seconds for improved efficiency and safety with gait.   Baseline 11.39 sec 03/23/16   Time 4   Period Weeks   Status Achieved           PT Long Term Goals - 02/18/16 1245      PT LONG TERM GOAL #1   Title Pt will verbalize understanding of fall prevention in home environment.  TARGET 04/18/16   Time 8   Period Weeks   Status New     PT LONG TERM GOAL #2   Title Pt will perform at least 8 of 10 reps of sit<>stand transfers from lower than 18" surfaces, independently, for improved ease of transfers.   Time 8   Period Weeks   Status New     PT LONG TERM GOAL #3   Title Pt will improve TUG and TUG cognitive to <10% difference in times, for improved dual tasking with gait.   Time 8   Period Weeks   Status New     PT LONG TERM GOAL #4   Title Pt will improve 4-square step test to less than or equal to 11 seconds with  no LOB, for improved balance with change of directions.   Time 8   Period Weeks  Status New     PT LONG TERM GOAL #5   Title Pt will verbalize plans for continued community fitness upon D/C from PT.   Time 8   Period Weeks   Status New               Plan - 04/27/16 [id number]    Clinical Impression Statement Performed prone PWR! Moves without difficulty, in preparation for community PWR! Moves class.  Pt continues to perform dynamic balance activities well and is reporting improved ability for SLS when dressing.  Pt will continue to benefit from dynamic balance and gait activities as well as discussion regarding progression to community fitness upon D/C.   Rehab Potential Good   PT Frequency 2x / week  1x/wk for 1 wk, then   PT Duration 8 weeks   PT Treatment/Interventions ADLs/Self Care Home Management;Functional mobility training;Gait training;Therapeutic activities;Therapeutic exercise;Balance training;Neuromuscular re-education;Patient/family education   PT Next Visit Plan Review HEP if needed; gait with walking poles; discuss d/c next visit versus next week.   Consulted and Agree with Plan of Care Patient;Family member/caregiver   Family Member Consulted wife      Patient will benefit from skilled therapeutic intervention in order to improve the following deficits and impairments:  Abnormal gait, Decreased balance, Decreased mobility, Difficulty walking, Impaired flexibility, Postural dysfunction  Visit Diagnosis: Unsteadiness on feet  Abnormal posture     Problem List Patient Active Problem List   Diagnosis Date Noted  . Parkinson disease ([place]) 01/04/2016  . Seasonal and perennial allergic rhinitis 06/21/2014  . Asthma, mild intermittent, well-controlled 06/21/2014  . Pseudomonas aeruginosa colonization 10/08/2012  . Hemorrhage of rectum and anus 07/01/2012  . CAD (coronary artery disease) 11/15/2011  . Hypertension 11/15/2011  . Hyperlipidemia 11/15/2011  .  Obesity 11/15/2011  . Ureteral calculus, right 06/12/2011    Tkeya Stencil W. 04/27/2016, 8:26 AM  Frazier Butt., PT [facility] [address] [place], [place], [address] Phone: [phone]   Fax:  [phone]  Name: JVEON PADFIELD MRN: [id number] Date of Birth: 1955/07/28

## 2016-05-03 ENCOUNTER — Ambulatory Visit: Payer: 59 | Admitting: Occupational Therapy

## 2016-05-03 DIAGNOSIS — R29818 Other symptoms and signs involving the nervous system: Secondary | ICD-10-CM | POA: Diagnosis not present

## 2016-05-03 DIAGNOSIS — R2689 Other abnormalities of gait and mobility: Secondary | ICD-10-CM | POA: Diagnosis not present

## 2016-05-03 DIAGNOSIS — R29898 Other symptoms and signs involving the musculoskeletal system: Secondary | ICD-10-CM | POA: Diagnosis not present

## 2016-05-03 DIAGNOSIS — R293 Abnormal posture: Secondary | ICD-10-CM | POA: Diagnosis not present

## 2016-05-03 DIAGNOSIS — R2681 Unsteadiness on feet: Secondary | ICD-10-CM | POA: Diagnosis not present

## 2016-05-03 DIAGNOSIS — R471 Dysarthria and anarthria: Secondary | ICD-10-CM | POA: Diagnosis not present

## 2016-05-03 DIAGNOSIS — R278 Other lack of coordination: Secondary | ICD-10-CM | POA: Diagnosis not present

## 2016-05-03 NOTE — Therapy (Signed)
[facility] [address] [place], [place], [address] Phone: [phone]   Fax:  [phone]  Occupational Therapy Treatment  Patient Details  Name: Todd Rice MRN: [id number] Date of Birth: July 17, 1955 Referring Provider: Dr. [name]  Encounter Date: 05/03/2016      OT End of Session - 05/03/16 1714    Visit Number 11   Number of Visits 17   Date for OT Re-Evaluation 05/17/16  extended due to missed visits   Authorization Type [place] UMR   OT Start Time 1457   OT Stop Time 1540   OT Time Calculation (min) 43 min   Activity Tolerance Patient tolerated treatment well   Behavior During Therapy [facility] for tasks assessed/performed      Past Medical History:  Diagnosis Date  . Adult BMI 50.0-59.9 kg/sq m    52.77  . Anal fissure   . Anxiety   . Diabetes mellitus without complication (HCC)    borderline  . GERD (gastroesophageal reflux disease)    30 years ago, maybe from peptic ulcer  . H/O: gout    STABLE  . Hepatic steatosis 10/01/11  . History of kidney stones   . Hyperlipidemia   . Hypertension   . Kidney tumor    right upper kidney tumor, monitoring  . OSA on CPAP   . Parkinson disease ([place]) 01/04/2016  . Right ureteral stone   . Vitamin D deficiency   . Weakness     Past Surgical History:  Procedure Laterality Date  . ANAL FISSURE REPAIR  10-12-2000  . COLONOSCOPY N/A 07/01/2012   Procedure: COLONOSCOPY;  Surgeon: Lafayette Dragon, MD;  [facility];  Service: Endoscopy;  Laterality: N/A;  . CYSTOSCOPY WITH RETROGRADE PYELOGRAM, URETEROSCOPY AND STENT PLACEMENT Bilateral 02/26/2013   Procedure: CYSTOSCOPY WITH RETROGRADE PYELOGRAM, URETEROSCOPY AND STENT PLACEMENT;  Surgeon: Alexis Frock, MD;  [facility];  Service: Urology;  Laterality: Bilateral;  . HOLMIUM LASER APPLICATION Bilateral [id number]   Procedure: HOLMIUM LASER APPLICATION;  Surgeon: Alexis Frock, MD;  [facility];   Service: Urology;  Laterality: Bilateral;  . kidney stone removal     06/2011-stent placement  . LEFT MEDIAL FEMORAL CONDYLE DEBRIDEMENT & DRILLING/ REMOVAL LOOSE BODY  09-15-2003  . NASAL SEPTUM SURGERY  1985  . STONE EXTRACTION WITH BASKET  06/12/2011   Procedure: STONE EXTRACTION WITH BASKET;  Surgeon: Claybon Jabs, MD;  Location[facility];  Service: Urology;  Laterality: Right;    There were no vitals filed for this visit.      Subjective Assessment - 05/03/16 1711    Subjective  Pt reports concerns with ability to continue to work due to R hand bradykinesia.  "My hand is the biggest problem"   Patient is accompained by: Family member   Patient Stated Goals improve function in right hand   Currently in Pain? No/denies   Pain Onset Yesterday       Pt arrives today and voices concerns regarding incr difficulty manipulating keys in hand, dropping items, and work tasks (pt reports that 2 pt's commented on how slow his hand was).  Pt questions whether he should apply for disability/retire.  Emphasized importance of PWR! Hands HEP during day and stretching hand during activity.  Recommended pt discuss medication with neurologist at next visit and whether he may benefit from medication adjustment due to concerns regarding R hand.  Also encouraged pt to perform PWR! Hands prior to grasp of objects and  to perform coordination HEP everyday.  Pt reports that he has noticed improvements with therapy, but that he has difficulty controlling 3-5th digits on R hand.   Picking up and manipulating coins in R hand with mod difficulty/drops and incr time (min cues to avoid compensation by using L hand).  Emphasized large amplitude movements, PWR! Hands prior to picking up coins, and supination.  Also educated pt to perform PROM supination due to mod rigidity with this movement (in addition to [facility]! Hands twist).  Sliding/flicking cards across table with R hand with min cueing for large  amplitude/PWR! Movements.  Pt performed with min difficulty.                     OT Education - 05/03/16 1712    Education Details Recommendation to talk to MD (appt tommorrow) about medications due to continued concerns about R hand for fine motor tasks and gave pt/spouse NPF medication booklet to review   Person(s) Educated Patient;Spouse   Methods Explanation;Handout   Comprehension Verbalized understanding          OT Short Term Goals - 04/20/16 1501      OT SHORT TERM GOAL #1   Title I with PD  specific HEP.   Time 4   Period Weeks   Status Achieved     OT SHORT TERM GOAL #2   Title I with adapted strategies for ADLs/ IADLs.   Time 4   Period Weeks   Status Achieved     OT SHORT TERM GOAL #3   Title Pt will demonstrate ability to write a sentence with 95% legibility, good letter size with only minimal decrease in letter size.   Time 4   Status Achieved     OT SHORT TERM GOAL #4   Title Pt will demonstrate improved RUE functional use as evidenced by increasing box/ blocks score to 43 blocks or greater.   Status Achieved  45 blocks     OT SHORT TERM GOAL #5   Title Pt will increase bilateral standing functional reach to 10 inches or greater bilaterally for decreased fall risk   Baseline RUE 8.5, LUE 9 inches   Time 4   Status Achieved  RUE 10.5 inches, LUE 10 inch           OT Long Term Goals - 03/15/16 1704      OT LONG TERM GOAL #1   Title Pt will verbalize understanding of community resources and ways to prevent future PD related  complications.   Time 8   Period Weeks   Status New     OT LONG TERM GOAL #2   Title Pt will demonstrate improved fine motor coordination as evidenced by decreasing RUE 9 hole peg test to 35 secs or less.   Baseline RUE 39.81 secs, LUE 30.72 secs   Time 8   Period Weeks   Status New     OT LONG TERM GOAL #3   Title Pt will demonstrate improved ability to fasten buttons as evidenced by decreasing 3  button/ unbutton test to 45 secs or less.   Time 8   Period Weeks   Status New     OT LONG TERM GOAL #4   Title Pt will verbalize understanding of ways to keep thinking skills sharp and ways to compensate for memory changes PRN..   Time 8   Period Weeks   Status Achieved     OT LONG TERM GOAL #5  Title Pt will report UE pain no greater than 3/10 for ADLs/IADLs.   Time 8   Period Weeks   Status New               Plan - 05/03/16 1715    Clinical Impression Statement Pt reports concerns regarding R hand coordination/movement for work.  Emphasized importance of performing PWR! hands before grasp of objects and performing coordination HEP everyday.   Rehab Potential Good   OT Frequency 2x / week   OT Duration 8 weeks   OT Treatment/Interventions Self-care/ADL training;Moist Heat;Fluidtherapy;DME and/or AE instruction;Splinting;Patient/family education;Balance training;Therapeutic exercises;Gait Training;Contrast Bath;Ultrasound;Therapeutic exercise;Therapeutic activities;Cognitive remediation/compensation;Passive range of motion;Functional Mobility Training;Neuromuscular education;Cryotherapy;Electrical Stimulation;Parrafin;Energy conservation;Manual Therapy;Visual/perceptual remediation/compensation   Plan check LTGs, work on R hand coordination   [place] issued PWR! seated quadraped and coordination HEP, ways to prevent future PD related complications, big movements with ADLs handout   Consulted and Agree with Plan of Care Patient;Family member/caregiver      Patient will benefit from skilled therapeutic intervention in order to improve the following deficits and impairments:  Abnormal gait, Decreased range of motion, Difficulty walking, Impaired flexibility, Decreased safety awareness, Decreased endurance, Decreased activity tolerance, Decreased knowledge of precautions, Impaired tone, Impaired UE functional use, Pain, Decreased balance, Decreased cognition,  Decreased mobility, Decreased strength  Visit Diagnosis: Other symptoms and signs involving the nervous system  Other symptoms and signs involving the musculoskeletal system  Abnormal posture  Other lack of coordination    Problem List Patient Active Problem List   Diagnosis Date Noted  . Parkinson disease ([place]) 01/04/2016  . Seasonal and perennial allergic rhinitis 06/21/2014  . Asthma, mild intermittent, well-controlled 06/21/2014  . Pseudomonas aeruginosa colonization 10/08/2012  . Hemorrhage of rectum and anus 07/01/2012  . CAD (coronary artery disease) 11/15/2011  . Hypertension 11/15/2011  . Hyperlipidemia 11/15/2011  . Obesity 11/15/2011  . Ureteral calculus, right 06/12/2011    [facility] 05/03/2016, 5:27 PM  [facility] [address] [place], [place], [address] Phone: [phone]   Fax:  [phone]  Name: Todd Rice MRN: [id number] Date of Birth: 06/06/1955   Vianne Bulls, OTR/L [facility] [address] [place], [place]  [address] [phone] phone [phone] 05/03/16 5:27 PM

## 2016-05-04 ENCOUNTER — Ambulatory Visit: Payer: 59 | Admitting: Physical Therapy

## 2016-05-04 ENCOUNTER — Ambulatory Visit: Payer: 59

## 2016-05-04 DIAGNOSIS — R2681 Unsteadiness on feet: Secondary | ICD-10-CM | POA: Diagnosis not present

## 2016-05-04 DIAGNOSIS — R29818 Other symptoms and signs involving the nervous system: Secondary | ICD-10-CM

## 2016-05-04 DIAGNOSIS — R2689 Other abnormalities of gait and mobility: Secondary | ICD-10-CM | POA: Diagnosis not present

## 2016-05-04 DIAGNOSIS — R29898 Other symptoms and signs involving the musculoskeletal system: Secondary | ICD-10-CM | POA: Diagnosis not present

## 2016-05-04 DIAGNOSIS — R293 Abnormal posture: Secondary | ICD-10-CM

## 2016-05-04 DIAGNOSIS — R471 Dysarthria and anarthria: Secondary | ICD-10-CM | POA: Diagnosis not present

## 2016-05-04 DIAGNOSIS — R278 Other lack of coordination: Secondary | ICD-10-CM | POA: Diagnosis not present

## 2016-05-04 NOTE — Progress Notes (Signed)
I agree with the assessment and plan, as directed and discussed with PT. Margareta Laureano, MD

## 2016-05-04 NOTE — Patient Instructions (Signed)

## 2016-05-04 NOTE — Therapy (Signed)
[facility] [address] [place], [place], [address] Phone: [phone]   Fax:  [phone]  Speech Language Pathology Treatment  Patient Details  Name: Todd Rice MRN: [id number] Date of Birth: Sep 27, 1955 Referring Provider: Neldon Newport MD  Encounter Date: 05/04/2016      End of Session - 05/04/16 1659    Visit Number 8   Number of Visits 16   Date for SLP Re-Evaluation 05/19/16   SLP Start Time [id number]   SLP Stop Time  1445   SLP Time Calculation (min) 40 min   Activity Tolerance Patient tolerated treatment well      Past Medical History:  Diagnosis Date  . Adult BMI 50.0-59.9 kg/sq m    52.77  . Anal fissure   . Anxiety   . Diabetes mellitus without complication (HCC)    borderline  . GERD (gastroesophageal reflux disease)    30 years ago, maybe from peptic ulcer  . H/O: gout    STABLE  . Hepatic steatosis 10/01/11  . History of kidney stones   . Hyperlipidemia   . Hypertension   . Kidney tumor    right upper kidney tumor, monitoring  . OSA on CPAP   . Parkinson disease ([place]) 01/04/2016  . Right ureteral stone   . Vitamin D deficiency   . Weakness     Past Surgical History:  Procedure Laterality Date  . ANAL FISSURE REPAIR  10-12-2000  . COLONOSCOPY N/A 07/01/2012   Procedure: COLONOSCOPY;  Surgeon: Lafayette Dragon, MD;  Location: [facility];  Service: Endoscopy;  Laterality: N/A;  . CYSTOSCOPY WITH RETROGRADE PYELOGRAM, URETEROSCOPY AND STENT PLACEMENT Bilateral 02/26/2013   Procedure: CYSTOSCOPY WITH RETROGRADE PYELOGRAM, URETEROSCOPY AND STENT PLACEMENT;  Surgeon: Alexis Frock, MD;  Location: [facility];  Service: Urology;  Laterality: Bilateral;  . HOLMIUM LASER APPLICATION Bilateral [id number]   Procedure: HOLMIUM LASER APPLICATION;  Surgeon: Alexis Frock, MD;  Location: [facility];  Service: Urology;  Laterality: Bilateral;  . kidney stone removal     06/2011-stent placement  . LEFT MEDIAL  FEMORAL CONDYLE DEBRIDEMENT & DRILLING/ REMOVAL LOOSE BODY  09-15-2003  . NASAL SEPTUM SURGERY  1985  . STONE EXTRACTION WITH BASKET  06/12/2011   Procedure: STONE EXTRACTION WITH BASKET;  Surgeon: Claybon Jabs, MD;  Location: [facility];  Service: Urology;  Laterality: Right;    There were no vitals filed for this visit.      Subjective Assessment - 05/04/16 1417    Currently in Pain? No/denies               ADULT SLP TREATMENT - 05/04/16 1421      General Information   Behavior/Cognition Alert;Cooperative;Pleasant mood     Treatment Provided   Treatment provided Cognitive-Linquistic     Cognitive-Linquistic Treatment   Treatment focused on Dysarthria   Skilled Treatment Recalibration of conversational loudness with use of loud /a/ with average in mid 90s dB.  In 15 minutes of min-mod complex conversation before loud /a/ reps, pt maintained WNL loudness 95-100% of the time. In 13 minutes conversation following loud /a/ outside of therapy room pt maintained an intelligible voice at an audiblevolume 100% of the time. Discussed external cues for pt to habitualize louder, WNL speech loudness.     Assessment / Recommendations / Plan   Plan Continue with current plan of care     Progression Toward Goals   Progression toward goals Progressing toward goals  SLP Education - 05/04/16 1659    Education provided Yes   Education Details external cues for loudness in conversation   Person(s) Educated Patient   Methods Explanation   Comprehension Verbalized understanding          SLP Short Term Goals - 05/04/16 1701      SLP SHORT TERM GOAL #1   Title pt will maintain loud /a/ average 94dB with appropriate voicing (<15% strained/strangled voice) over 4 sessions   Baseline 04-20-16, 04-27-16, 05-04-16   Time 1   Period Weeks   Status On-going     SLP SHORT TERM GOAL #2   Title pt will produce 19/20 sentences with average 70dB over two sessions    Status Achieved     SLP [place] #3   Title pt will demo simple conversation of 8 minutes with average 70dB over three sessions   Baseline 04-25-16, 04-27-16   Time 1   Period Weeks   Status On-going     SLP SHORT TERM GOAL #4   Title pt will exhibit loud "Hey!" with average 95dB over 3 sessions   Status Deferred  Pt with loud /a/          SLP Long Term Goals - 05/04/16 1701      SLP LONG TERM GOAL #1   Title pt will maintain loud /a/ at 95dB with proper voicing over 5 sessions   Baseline 04-20-16   Time 3   Period Weeks   Status On-going     SLP LONG TERM GOAL #2   Title pt will demo 10 minutes mod complex conversastion with average 70dB over three sessions   Baseline 04-27-16, 05-04-16   Time 3   Period Weeks   Status On-going          Plan - 05/04/16 1659    Clinical Impression Statement Pt did excellent job maintaining adequate volume in mod complex conversation outside therapy room. In conversation in the ST room pt maintained WNL volume consistently. Continue skilled ST once a week (due to progress) to maximize intellgilbility. Likely d/c in next 1-2 weeks   Speech Therapy Frequency 1x /week   Duration --  8 weeks/16 visits   Treatment/Interventions Compensatory strategies;Patient/family education;Functional tasks;Cueing hierarchy;SLP instruction and feedback;Internal/external aids   Potential to Achieve Goals Good      Patient will benefit from skilled therapeutic intervention in order to improve the following deficits and impairments:   Dysarthria and anarthria    Problem List Patient Active Problem List   Diagnosis Date Noted  . Parkinson disease ([place]) 01/04/2016  . Seasonal and perennial allergic rhinitis 06/21/2014  . Asthma, mild intermittent, well-controlled 06/21/2014  . Pseudomonas aeruginosa colonization 10/08/2012  . Hemorrhage of rectum and anus 07/01/2012  . CAD (coronary artery disease) 11/15/2011  . Hypertension 11/15/2011   . Hyperlipidemia 11/15/2011  . Obesity 11/15/2011  . Ureteral calculus, right 06/12/2011    [facility] ,MS, CCC-SLP  05/04/2016, 5:02 PM  [facility] [address] [place], [place], [address] Phone: [phone]   Fax:  [phone]   Name: Todd Rice MRN: [id number] Date of Birth: July 28, 1955

## 2016-05-04 NOTE — Therapy (Signed)
[facility] [address] [place], [place], [address] Phone: [phone]   Fax:  [phone]  Physical Therapy Treatment  Patient Details  Name: Todd Rice MRN: [id number] Date of Birth: 12-Sep-1955 Referring Provider: [name]  Encounter Date: 05/04/2016      PT End of Session - 05/04/16 1416    Visit Number 10   Number of Visits 13   Date for PT Re-Evaluation 04/18/16   Authorization Type UMR   PT Start Time 1320   PT Stop Time 1404   PT Time Calculation (min) 44 min   Activity Tolerance Patient tolerated treatment well   Behavior During Therapy [facility] for tasks assessed/performed      Past Medical History:  Diagnosis Date  . Adult BMI 50.0-59.9 kg/sq m    52.77  . Anal fissure   . Anxiety   . Diabetes mellitus without complication (HCC)    borderline  . GERD (gastroesophageal reflux disease)    30 years ago, maybe from peptic ulcer  . H/O: gout    STABLE  . Hepatic steatosis 10/01/11  . History of kidney stones   . Hyperlipidemia   . Hypertension   . Kidney tumor    right upper kidney tumor, monitoring  . OSA on CPAP   . Parkinson disease ([place]) 01/04/2016  . Right ureteral stone   . Vitamin D deficiency   . Weakness     Past Surgical History:  Procedure Laterality Date  . ANAL FISSURE REPAIR  10-12-2000  . COLONOSCOPY N/A 07/01/2012   Procedure: COLONOSCOPY;  Surgeon: Lafayette Dragon, MD;  [facility];  Service: Endoscopy;  Laterality: N/A;  . CYSTOSCOPY WITH RETROGRADE PYELOGRAM, URETEROSCOPY AND STENT PLACEMENT Bilateral 02/26/2013   Procedure: CYSTOSCOPY WITH RETROGRADE PYELOGRAM, URETEROSCOPY AND STENT PLACEMENT;  Surgeon: Alexis Frock, MD;  [facility]: [facility];  Service: Urology;  Laterality: Bilateral;  . HOLMIUM LASER APPLICATION Bilateral 88/50/2774   Procedure: HOLMIUM LASER APPLICATION;  Surgeon: Alexis Frock, MD;  Location: [facility];  Service: Urology;  Laterality: Bilateral;   . kidney stone removal     06/2011-stent placement  . LEFT MEDIAL FEMORAL CONDYLE DEBRIDEMENT & DRILLING/ REMOVAL LOOSE BODY  09-15-2003  . NASAL SEPTUM SURGERY  1985  . STONE EXTRACTION WITH BASKET  06/12/2011   Procedure: STONE EXTRACTION WITH BASKET;  Surgeon: Claybon Jabs, MD;  [facility];  Service: Urology;  Laterality: Right;    There were no vitals filed for this visit.      Subjective Assessment - 05/04/16 1322    Subjective Blood sugar back to normal (all the prednisone out of my system), and I feel pretty good today.   Patient Stated Goals Pt's goal for physical therapy to is regain normal use of R arm and keep from falling.   Currently in Pain? No/denies   Pain Onset Yesterday                         [facility] Adult PT Treatment/Exercise - 05/04/16 0001      Transfers   Transfers Sit to Stand;Stand to Sit   Sit to Stand 6: Modified independent (Device/Increase time);Without upper extremity assist;From chair/3-in-1   Five time sit to stand comments  11.36   Stand to Sit 6: Modified independent (Device/Increase time);Without upper extremity assist;To chair/3-in-1   Number of Reps 10 reps  from 20", then from 17" surfaces     Ambulation/Gait   Ambulation/Gait  Yes   Ambulation/Gait Assistance 6: Modified independent (Device/Increase time);5: Supervision   Ambulation/Gait Assistance Details Indoor gait training with pt's bilateral walking poles (as weather too cold for walking outdoors).  Adjusted pt's walking poles to appropriate height.  Pt does better with sequencing  walking poles today, with concentration on swinging arms and pushing through poles posteriorly.   Ambulation Distance (Feet) 800 Feet   Assistive device --  bilateral walking poles   Gait Pattern Step-through pattern;Decreased arm swing - right;Decreased step length - right   Ambulation Surface Level;Indoor   Gait velocity 9.45 sec = 3.47 ft/sec     Standardized  Balance Assessment   Standardized Balance Assessment Timed Up and Go Test     Timed Up and Go Test   TUG Normal TUG;Cognitive TUG   Normal TUG (seconds) 10.32   Cognitive TUG (seconds) 11.42     High Level Balance   High Level Balance Activities --  Forward step and weightshift, back step/weigthshift x 10   High Level Balance Comments Four square step test:  10.67 sec, 10 seconds     Self-Care   Self-Care Other Self-Care Comments   Other Self-Care Comments  Fall prevention education formally provided, with handout given for fall prevention in home environment.  Discussed return to PT (screen vs. eval) in 6 months or sooner if need arises-will likely schedule upon d/c from OT or speech therapy.             [facility]) - 05/04/16 1343    PWR! exercises Moves in sitting;Moves in standing   PWR! Up x 10    PWR! Rock x 10   PWR! Twist x 10    PWR Step x 10   Comments Pt performs standing PWR! Moves independently as part of [place]! Up x 10   PWR! Rock x 10    PWR! Twist x 10   PWR! Step x 10   Comments Pt performs seated PWR! MOves independently as part of HEP.             PT Education - 05/04/16 1415    Education provided Yes   Education Details Fall prevention education; progress towards goals, plans for d/c; plans for return screen vs. eval in 6 months.   Person(s) Educated Patient   Methods Explanation;Handout   Comprehension Verbalized understanding          PT Short Term Goals - 03/23/16 1324      PT SHORT TERM GOAL #1   Title Pt will be independent with HEP for improved balance, transfers, gait.  TARGET 03/19/16   Time 4   Period Weeks   Status Achieved     PT SHORT TERM GOAL #2   Title Pt will verbalize understanding of local community Parkinson's related resources.   Time 4   Period Weeks   Status Achieved     PT SHORT TERM GOAL #3   Title Pt will improve 5x sit<>stand to less than or equal to 11.5 seconds for improved efficiency and safety  with gait.   Baseline 11.39 sec 03/23/16   Time 4   Period Weeks   Status Achieved           PT Long Term Goals - 05/04/16 1329      PT LONG TERM GOAL #1   Title Pt will verbalize understanding of fall prevention in home environment.  TARGET 04/18/16   Time 8   Period Weeks   Status Achieved  PT LONG TERM GOAL #2   Title Pt will perform at least 8 of 10 reps of sit<>stand transfers from lower than 18" surfaces, independently, for improved ease of transfers.   Time 8   Period Weeks   Status Achieved     PT LONG TERM GOAL #3   Title Pt will improve TUG and TUG cognitive to <10% difference in times, for improved dual tasking with gait.   Baseline TUG 10.32 sec, TUG cog 11.42 sec (slightly more than 10% difference)   Time 8   Period Weeks   Status Not Met     PT LONG TERM GOAL #4   Title Pt will improve 4-square step test to less than or equal to 11 seconds with no LOB, for improved balance with change of directions.   Time 8   Period Weeks   Status Achieved     PT LONG TERM GOAL #5   Title Pt will verbalize plans for continued community fitness upon D/C from PT.   Time 8   Period Weeks   Status Achieved               Plan - 05/04/16 1417    Clinical Impression Statement Pt does better with sequencing walking poles with long distance, indoor gait practice today.  Pt independent with PWR! Moves as part of HEP.  Pt has met LTG 1, 2, 4, 5.  LTG 3 not met, with slightly more than 10% difference in TUG and cognitive TUG.  Overall pt demo improved functional mobiltiy measures and is apporpriate for d/c this visit.   Rehab Potential Good   PT Frequency 2x / week  1x/wk for 1 wk, then   PT Duration 8 weeks   PT Treatment/Interventions ADLs/Self Care Home Management;Functional mobility training;Gait training;Therapeutic activities;Therapeutic exercise;Balance training;Neuromuscular re-education;Patient/family education   PT Next Visit Plan Discharge this visit.  Plan  return screen versus eval in 6 months due to progressive nature of disease.   Consulted and Agree with Plan of Care Patient      Patient will benefit from skilled therapeutic intervention in order to improve the following deficits and impairments:  Abnormal gait, Decreased balance, Decreased mobility, Difficulty walking, Impaired flexibility, Postural dysfunction  Visit Diagnosis: Other abnormalities of gait and mobility  Abnormal posture  Other symptoms and signs involving the nervous system     Problem List Patient Active Problem List   Diagnosis Date Noted  . Parkinson disease ([facility]) 01/04/2016  . Seasonal and perennial allergic rhinitis 06/21/2014  . Asthma, mild intermittent, well-controlled 06/21/2014  . Pseudomonas aeruginosa colonization 10/08/2012  . Hemorrhage of rectum and anus 07/01/2012  . CAD (coronary artery disease) 11/15/2011  . Hypertension 11/15/2011  . Hyperlipidemia 11/15/2011  . Obesity 11/15/2011  . Ureteral calculus, right 06/12/2011    MARRIOTT,AMY W. 05/04/2016, 2:20 PM  MARRIOTT,AMY W., PT  [facility] [address] [place], [place], [address] Phone: [phone]   Fax:  [phone]  Name: Derk D Lumbra MRN: [id number] Date of Birth: 11/28/1955   PHYSICAL THERAPY DISCHARGE SUMMARY  Visits from Start of Care: 10  Current functional level related to goals / functional outcomes:     PT Long Term Goals - 05/04/16 1329      PT LONG TERM GOAL #1   Title Pt will verbalize understanding of fall prevention in home environment.  TARGET 04/18/16   Time 8   Period Weeks   Status Achieved     PT   LONG TERM GOAL #2   Title Pt will perform at least 8 of 10 reps of sit<>stand transfers from lower than 18" surfaces, independently, for improved ease of transfers.   Time 8   Period Weeks   Status Achieved     PT LONG TERM GOAL #3   Title Pt will improve TUG and TUG cognitive to <10%  difference in times, for improved dual tasking with gait.   Baseline TUG 10.32 sec, TUG cog 11.42 sec (slightly more than 10% difference)   Time 8   Period Weeks   Status Not Met     PT LONG TERM GOAL #4   Title Pt will improve 4-square step test to less than or equal to 11 seconds with no LOB, for improved balance with change of directions.   Time 8   Period Weeks   Status Achieved     PT LONG TERM GOAL #5   Title Pt will verbalize plans for continued community fitness upon D/C from PT.   Time 8   Period Weeks   Status Achieved    Pt has met 4 of 5 long term goals.   Remaining deficits: Bradykinesia, rigidity   Education / Equipment: Pt has been educated in HEP, fall prevention, local PD resources.    Plan: Patient agrees to discharge.  Patient goals were met. Patient is being discharged due to meeting the stated rehab goals.  ?????Recommend PT screen/eval in 6 months due to progressive nature of disease process.  Mady Haagensen, PT 05/04/16 2:23 PM Phone: [phone] Fax: [phone]

## 2016-05-05 ENCOUNTER — Telehealth: Payer: Self-pay | Admitting: Physician Assistant

## 2016-05-05 MED ORDER — GLIPIZIDE 5 MG PO TABS: 5.0000 mg | ORAL_TABLET | Freq: Two times a day (BID) | ORAL | 11 refills | Status: DC

## 2016-05-05 NOTE — Telephone Encounter (Signed)
LVM detailed message informing pt of new Rx. I also informed pt that all this information was sent to his MyChart acct.

## 2016-05-05 NOTE — Telephone Encounter (Signed)
Patient was given prednisone for fever/sepsis 12/04, states sugars have been running 200's fasting and 330 non fasting.   Will send in glipizide 5mg   Please be careful with glipizide (Glucotrol). This medication forces your blood sugar down no matter what it is starting at. Only take 1/2 of the medication if your sugar is above 150 and you can take a whole pill if your sugar is above 200. Check your sugar 3 times a day, only take this medication WITH food.   If at any time you start to have low blood sugars in the morning or during the day please stop this medication. Please never take this medication if you are sick or can not eat. A low blood sugar is much more dangerous than a high blood sugar. Your brain needs two things, sugar and oxygen.   Follow up 1-2 weeks with sugar readings

## 2016-05-09 ENCOUNTER — Ambulatory Visit: Payer: 59

## 2016-05-09 ENCOUNTER — Ambulatory Visit: Payer: 59 | Attending: Neurology | Admitting: Occupational Therapy

## 2016-05-09 DIAGNOSIS — R471 Dysarthria and anarthria: Secondary | ICD-10-CM

## 2016-05-09 DIAGNOSIS — R29818 Other symptoms and signs involving the nervous system: Secondary | ICD-10-CM | POA: Insufficient documentation

## 2016-05-09 DIAGNOSIS — R278 Other lack of coordination: Secondary | ICD-10-CM | POA: Diagnosis not present

## 2016-05-09 DIAGNOSIS — R29898 Other symptoms and signs involving the musculoskeletal system: Secondary | ICD-10-CM | POA: Diagnosis not present

## 2016-05-09 DIAGNOSIS — R293 Abnormal posture: Secondary | ICD-10-CM | POA: Diagnosis not present

## 2016-05-09 NOTE — Therapy (Signed)
[facility] [address] [place], [place], [address] Phone: [phone]   Fax:  [phone]  Occupational Therapy Treatment  Patient Details  Name: Todd Rice MRN: [id number] Date of Birth: 05-Dec-1955 Referring Provider: Dr. [name]  Encounter Date: 05/09/2016      OT End of Session - 05/09/16 1007    Visit Number 12   Number of Visits 17   Date for OT Re-Evaluation 05/17/16   Authorization Type [employer]   OT Start Time 0905   OT Stop Time 0947   OT Time Calculation (min) 42 min   Activity Tolerance Patient tolerated treatment well   Behavior During Therapy [facility] for tasks assessed/performed      Past Medical History:  Diagnosis Date  . Adult BMI 50.0-59.9 kg/sq m    52.77  . Anal fissure   . Anxiety   . Diabetes mellitus without complication (HCC)    borderline  . GERD (gastroesophageal reflux disease)    30 years ago, maybe from peptic ulcer  . H/O: gout    STABLE  . Hepatic steatosis 10/01/11  . History of kidney stones   . Hyperlipidemia   . Hypertension   . Kidney tumor    right upper kidney tumor, monitoring  . OSA on CPAP   . Parkinson disease ([place]) 01/04/2016  . Right ureteral stone   . Vitamin D deficiency   . Weakness     Past Surgical History:  Procedure Laterality Date  . ANAL FISSURE REPAIR  10-12-2000  . COLONOSCOPY N/A 07/01/2012   Procedure: COLONOSCOPY;  Surgeon: Lafayette Dragon, MD;  [facility];  Service: Endoscopy;  Laterality: N/A;  . CYSTOSCOPY WITH RETROGRADE PYELOGRAM, URETEROSCOPY AND STENT PLACEMENT Bilateral 02/26/2013   Procedure: CYSTOSCOPY WITH RETROGRADE PYELOGRAM, URETEROSCOPY AND STENT PLACEMENT;  Surgeon: Alexis Frock, MD;  [facility]: [facility];  Service: Urology;  Laterality: Bilateral;  . HOLMIUM LASER APPLICATION Bilateral [id number]   Procedure: HOLMIUM LASER APPLICATION;  Surgeon: Alexis Frock, MD;  [facility]: [facility];  Service: Urology;  Laterality:  Bilateral;  . kidney stone removal     06/2011-stent placement  . LEFT MEDIAL FEMORAL CONDYLE DEBRIDEMENT & DRILLING/ REMOVAL LOOSE BODY  09-15-2003  . NASAL SEPTUM SURGERY  1985  . STONE EXTRACTION WITH BASKET  06/12/2011   Procedure: STONE EXTRACTION WITH BASKET;  Surgeon: Claybon Jabs, MD;  Location: [facility];  Service: Urology;  Laterality: Right;    There were no vitals filed for this visit.      Subjective Assessment - 05/09/16 1008    Subjective  Pt reports he sees MD tomorrow   Patient Stated Goals improve function in right hand   Currently in Pain? No/denies               Reviewed fine motor coordination activities from HEP: picking up and manipulating coins in hand, flipping playing cards and dealing with thumb, rotating ball in hand with RUE, min- mod v.c. For larger amplitude movements and technique. Standing for dynamic step and reach to copy small peg design with RUE, min v.c. For . And increased time Pt reports his right hand is slower and stiffer with increased tremors at medication off times. Pt sees MD tomorrow. Therapist to send a message.          [facility]) - 05/09/16 1015    PWR! Up x10   PWR! Rock x10   PWR! Twist x10   PWR! Step x10  Comments min v.c. for larger amplitude movements               OT Short Term Goals - 04/20/16 1501      OT SHORT TERM GOAL #1   Title I with PD  specific HEP.   Time 4   Period Weeks   Status Achieved     OT SHORT TERM GOAL #2   Title I with adapted strategies for ADLs/ IADLs.   Time 4   Period Weeks   Status Achieved     OT SHORT TERM GOAL #3   Title Pt will demonstrate ability to write a sentence with 95% legibility, good letter size with only minimal decrease in letter size.   Time 4   Status Achieved     OT SHORT TERM GOAL #4   Title Pt will demonstrate improved RUE functional use as evidenced by increasing box/ blocks score to 43 blocks or greater.   Status Achieved   45 blocks     OT SHORT TERM GOAL #5   Title Pt will increase bilateral standing functional reach to 10 inches or greater bilaterally for decreased fall risk   Baseline RUE 8.5, LUE 9 inches   Time 4   Status Achieved  RUE 10.5 inches, LUE 10 inch           OT Long Term Goals - 05/09/16 0911      OT LONG TERM GOAL #1   Title Pt will verbalize understanding of community resources and ways to prevent future PD related  complications.   Time 8   Period Weeks   Status Achieved     OT LONG TERM GOAL #2   Title Pt will demonstrate improved fine motor coordination as evidenced by decreasing RUE 9 hole peg test to 35 secs or less.   Time 8   Period Weeks   Status On-going  54.69 secs     OT LONG TERM GOAL #3   Title Pt will demonstrate improved ability to fasten buttons as evidenced by decreasing 3 button/ unbutton test to 45 secs or less.   Time 8   Period Weeks   Status On-going     OT LONG TERM GOAL #4   Time 8   Period Weeks   Status Achieved     OT LONG TERM GOAL #5   Title Pt will report UE pain no greater than 3/10 for ADLs/IADLs.   Time 8   Period Weeks   Status Achieved               Plan - 05/09/16 1002    Clinical Impression Statement Pt is progressing towards goals. He demonstrates limitations in RUE fine motor coordination with significant bradykinesia noted. Pt reports incr. tremor in RUE particularly at medication off times.   Rehab Potential Good   OT Frequency 2x / week   OT Duration 8 weeks   OT Treatment/Interventions Self-care/ADL training;Moist Heat;Fluidtherapy;DME and/or AE instruction;Splinting;Patient/family education;Balance training;Therapeutic exercises;Gait Training;Contrast Bath;Ultrasound;Therapeutic exercise;Therapeutic activities;Cognitive remediation/compensation;Passive range of motion;Functional Mobility Training;Neuromuscular education;Cryotherapy;Electrical Stimulation;Parrafin;Energy conservation;Manual  Therapy;Visual/perceptual remediation/compensation   Plan continue to work towards long term goals, discuss d/c vs. continuing therapy on Friday   [place] issued PWR! seated quadraped and coordination HEP, ways to prevent future PD related complications, big movements with ADLs handout   Consulted and Agree with Plan of Care Patient;Family member/caregiver      Patient will benefit from skilled therapeutic intervention in order to improve the following deficits  and impairments:  Abnormal gait, Decreased range of motion, Difficulty walking, Impaired flexibility, Decreased safety awareness, Decreased endurance, Decreased activity tolerance, Decreased knowledge of precautions, Impaired tone, Impaired UE functional use, Pain, Decreased balance, Decreased cognition, Decreased mobility, Decreased strength  Visit Diagnosis: Other abnormalities of gait and mobility  Abnormal posture  Other symptoms and signs involving the nervous system  Other symptoms and signs involving the musculoskeletal system  Other lack of coordination    Problem List Patient Active Problem List   Diagnosis Date Noted  . Parkinson disease ([place]) 01/04/2016  . Seasonal and perennial allergic rhinitis 06/21/2014  . Asthma, mild intermittent, well-controlled 06/21/2014  . Pseudomonas aeruginosa colonization 10/08/2012  . Hemorrhage of rectum and anus 07/01/2012  . CAD (coronary artery disease) 11/15/2011  . Hypertension 11/15/2011  . Hyperlipidemia 11/15/2011  . Obesity 11/15/2011  . Ureteral calculus, right 06/12/2011    RINE,KATHRYN 05/09/2016, 10:17 AM Theone Murdoch, OTR/L [phone] Phone: [phone] 12:34 PM 05/09/16 [facility] [address] [place], [place], [address] Phone: [phone]   Fax:  [phone]  Name: Todd Rice MRN: [id number] Date of Birth: 08-08-1955

## 2016-05-09 NOTE — Therapy (Signed)
[facility] [address] [place], [place], [address] Phone: [phone]   Fax:  [phone]  Speech Language Pathology Treatment  Patient Details  Name: Todd Rice MRN: [id number] Date of Birth: 1955/12/11 Referring Provider: Neldon Newport MD  Encounter Date: 05/09/2016      End of Session - 05/09/16 0905    Visit Number 9   Number of Visits 16   Date for SLP Re-Evaluation 05/19/16   SLP Start Time 0803   SLP Stop Time  0845   SLP Time Calculation (min) 42 min   Activity Tolerance Patient tolerated treatment well      Past Medical History:  Diagnosis Date  . Adult BMI 50.0-59.9 kg/sq m    52.77  . Anal fissure   . Anxiety   . Diabetes mellitus without complication (HCC)    borderline  . GERD (gastroesophageal reflux disease)    30 years ago, maybe from peptic ulcer  . H/O: gout    STABLE  . Hepatic steatosis 10/01/11  . History of kidney stones   . Hyperlipidemia   . Hypertension   . Kidney tumor    right upper kidney tumor, monitoring  . OSA on CPAP   . Parkinson disease ([place]) 01/04/2016  . Right ureteral stone   . Vitamin D deficiency   . Weakness     Past Surgical History:  Procedure Laterality Date  . ANAL FISSURE REPAIR  10-12-2000  . COLONOSCOPY N/A 07/01/2012   Procedure: COLONOSCOPY;  Surgeon: Lafayette Dragon, MD;  [facility];  Service: Endoscopy;  Laterality: N/A;  . CYSTOSCOPY WITH RETROGRADE PYELOGRAM, URETEROSCOPY AND STENT PLACEMENT Bilateral 02/26/2013   Procedure: CYSTOSCOPY WITH RETROGRADE PYELOGRAM, URETEROSCOPY AND STENT PLACEMENT;  Surgeon: Alexis Frock, MD;  [facility];  Service: Urology;  Laterality: Bilateral;  . HOLMIUM LASER APPLICATION Bilateral [id number]   Procedure: HOLMIUM LASER APPLICATION;  Surgeon: Alexis Frock, MD;  [facility];  Service: Urology;  Laterality: Bilateral;  . kidney stone removal     06/2011-stent placement  . LEFT MEDIAL  FEMORAL CONDYLE DEBRIDEMENT & DRILLING/ REMOVAL LOOSE BODY  09-15-2003  . NASAL SEPTUM SURGERY  1985  . STONE EXTRACTION WITH BASKET  06/12/2011   Procedure: STONE EXTRACTION WITH BASKET;  Surgeon: Claybon Jabs, MD;  Location: [facility];  Service: Urology;  Laterality: Right;    There were no vitals filed for this visit.      Subjective Assessment - 05/09/16 0813    Subjective "if they want me to go , that's fine."   Currently in Pain? No/denies               ADULT SLP TREATMENT - 05/09/16 0815      General Information   Behavior/Cognition Alert;Cooperative;Pleasant mood     Treatment Provided   Treatment provided Cognitive-Linquistic     Cognitive-Linquistic Treatment   Treatment focused on Dysarthria   Skilled Treatment Recalibration of conversational loudness with use of loud /a/ with average in mid 90s dB.  In 15 minutes of min-mod complex conversation before loud /a/ reps, pt maintained WNL loudness 95-100% of the time. In 12 minutes conversation following loud /a/ inside therapy room. Outside of therapy room pt maintained speech volume in 15 minutes mod complex conversation in low-70s dB 95% of the time. Pt reports not thinking about external cues for pt to habitualize louder, WNL speech loudness. SLP encouraged pt to do so, and pt stated rubber band would  be most helpful.      Assessment / Recommendations / Plan   Plan Continue with current plan of care     Progression Toward Goals   Progression toward goals Progressing toward goals            SLP Short Term Goals - 05/09/16 0906      SLP SHORT TERM GOAL #1   Title pt will maintain loud /a/ average 94dB with appropriate voicing (<15% strained/strangled voice) over 4 sessions   Status Achieved     SLP SHORT TERM GOAL #2   Title pt will produce 19/20 sentences with average 70dB over two sessions   Status Achieved     SLP SHORT TERM GOAL #3   Title pt will demo simple conversation of 8  minutes with average 70dB over three sessions   Status Achieved     SLP [place] #4   Title pt will exhibit loud "Hey!" with average 95dB over 3 sessions   Status Deferred  Pt with loud /a/          SLP Long Term Goals - 05/09/16 0906      SLP LONG TERM GOAL #1   Title pt will maintain loud /a/ at 95dB with proper voicing over 5 sessions   Baseline 04-20-16, 05-09-16   Time 2   Period Weeks   Status On-going     SLP LONG TERM GOAL #2   Title pt will demo 10 minutes mod complex conversastion with average 70dB over three sessions   Period Weeks   Status Achieved     SLP LONG TERM GOAL #3   Title pt will demo 15 minutes mod complex conversation wiht average 70dB outside [place] room, over two sessions   Baseline 05-09-16   Time 2   Period Weeks   Status New          Plan - 05/09/16 0906    Clinical Impression Statement Pt did excellent job maintaining adequate volume in mod complex conversation outside therapy room. In conversation in the ST room pt maintained WNL volume consistently. Continue skilled ST once a week (due to progress) to maximize intellgilbility. Likely d/c in next 1-2 weeks   Speech Therapy Frequency 1x /week   Duration --  8 weeks/16 visits   Treatment/Interventions Compensatory strategies;Patient/family education;Functional tasks;Cueing hierarchy;SLP instruction and feedback;Internal/external aids   Potential to Achieve Goals Good      Patient will benefit from skilled therapeutic intervention in order to improve the following deficits and impairments:   Dysarthria and anarthria    Problem List Patient Active Problem List   Diagnosis Date Noted  . Parkinson disease ([place]) 01/04/2016  . Seasonal and perennial allergic rhinitis 06/21/2014  . Asthma, mild intermittent, well-controlled 06/21/2014  . Pseudomonas aeruginosa colonization 10/08/2012  . Hemorrhage of rectum and anus 07/01/2012  . CAD (coronary artery disease) 11/15/2011  . Hypertension  11/15/2011  . Hyperlipidemia 11/15/2011  . Obesity 11/15/2011  . Ureteral calculus, right 06/12/2011    [facility] ,MS, CCC-SLP  05/09/2016, 9:08 AM  [facility] [address] [place], [place], [address] Phone: [phone]   Fax:  [phone]   Name: Todd Rice MRN: [id number] Date of Birth: 10/26/1955

## 2016-05-10 ENCOUNTER — Ambulatory Visit (INDEPENDENT_AMBULATORY_CARE_PROVIDER_SITE_OTHER): Payer: 59 | Admitting: Neurology

## 2016-05-10 ENCOUNTER — Encounter: Payer: Self-pay | Admitting: Neurology

## 2016-05-10 VITALS — BP 137/83 | HR 84 | Ht 67.0 in | Wt 299.0 lb

## 2016-05-10 DIAGNOSIS — G20 Parkinson's disease: Secondary | ICD-10-CM

## 2016-05-10 MED ORDER — PRAMIPEXOLE DIHYDROCHLORIDE 0.25 MG PO TABS: ORAL_TABLET | ORAL | 3 refills | Status: DC

## 2016-05-10 MED FILL — PRAMIPEXOLE 0.25 MG TABLET: 0.25 | 30 days supply | Qty: 180 | Fill #0

## 2016-05-10 NOTE — Patient Instructions (Signed)
   Mirapex (pramipexole) may result in confusion or hallucinations, dizziness, drowsiness, or nausea. If any significant side effects are noted, please contact our office.

## 2016-05-10 NOTE — Progress Notes (Signed)
Reason for visit: Parkinson's disease  Todd Rice is an 61 y.o. male  History of present illness:  Todd Rice is a 61 year old right-handed white male with a history of Parkinson's disease, obesity, and sleep apnea. The patient currently is on BiPAP for the sleep apnea and he indicates that he is sleeping relatively well. The patient has been on low-dose Mirapex taking 0.25 mg 3 times daily. He continues to have issues mainly with the right arm, occasionally with some tremor. His handwriting is poor, he is having increasing difficulty at work. As the day goes on, he will fatigue and his functional level is worse in the afternoon or evenings. The patient has not had any falls, he feels slightly unsteady if he has to stand for long period of time. The patient is tolerating the Mirapex well. He returns to this office for an evaluation.  Past Medical History:  Diagnosis Date  . Adult BMI 50.0-59.9 kg/sq m    52.77  . Anal fissure   . Anxiety   . Diabetes mellitus without complication (HCC)    borderline  . GERD (gastroesophageal reflux disease)    30 years ago, maybe from peptic ulcer  . H/O: gout    STABLE  . Hepatic steatosis 10/01/11  . History of kidney stones   . Hyperlipidemia   . Hypertension   . Kidney tumor    right upper kidney tumor, monitoring  . OSA on CPAP   . Parkinson disease ([place]) 01/04/2016  . Right ureteral stone   . Vitamin D deficiency   . Weakness     Past Surgical History:  Procedure Laterality Date  . ANAL FISSURE REPAIR  10-12-2000  . COLONOSCOPY N/A 07/01/2012   Procedure: COLONOSCOPY;  Surgeon: Lafayette Dragon, MD;  [facility];  Service: Endoscopy;  Laterality: N/A;  . CYSTOSCOPY WITH RETROGRADE PYELOGRAM, URETEROSCOPY AND STENT PLACEMENT Bilateral 02/26/2013   Procedure: CYSTOSCOPY WITH RETROGRADE PYELOGRAM, URETEROSCOPY AND STENT PLACEMENT;  Surgeon: Alexis Frock, MD;  [facility];  Service: Urology;  Laterality: Bilateral;    . HOLMIUM LASER APPLICATION Bilateral [id number]   Procedure: HOLMIUM LASER APPLICATION;  Surgeon: Alexis Frock, MD;  [facility];  Service: Urology;  Laterality: Bilateral;  . kidney stone removal     06/2011-stent placement  . LEFT MEDIAL FEMORAL CONDYLE DEBRIDEMENT & DRILLING/ REMOVAL LOOSE BODY  09-15-2003  . NASAL SEPTUM SURGERY  1985  . STONE EXTRACTION WITH BASKET  06/12/2011   Procedure: STONE EXTRACTION WITH BASKET;  Surgeon: Claybon Jabs, MD;  Location: [facility];  Service: Urology;  Laterality: Right;    Family History  Problem Relation Age of Onset  . Heart disease Mother     Atrial fibrillation  . Hypertension Mother   . Hyperlipidemia Mother   . Lung cancer Father     was a smoker  . Cancer Father     lung  . Hyperlipidemia Brother   . Heart disease Maternal Aunt   . Hyperlipidemia Maternal Aunt   . Hypertension Maternal Aunt   . Stroke Maternal Aunt   . Heart disease Paternal Grandmother   . Hyperlipidemia Paternal Grandmother     Social history:  reports that he has never smoked. He has never used smokeless tobacco. He reports that he does not drink alcohol or use drugs.    Allergies  Allergen Reactions  . Ceftriaxone Hives    Medications:  Prior to Admission medications   Medication Sig Start Date  End Date Taking? Authorizing Provider  allopurinol (ZYLOPRIM) 300 MG tablet TAKE 1 TABLET BY MOUTH DAILY. 12/20/15  Yes Unk Pinto, MD  ALPRAZolam Duanne Moron) 0.5 MG tablet TAKE 1 TABLET BY MOUTH 3 TIMES DAILY 03/13/16  Yes Unk Pinto, MD  aspirin EC 81 MG tablet Take 81 mg by mouth at bedtime.   Yes Historical Provider, MD  COLCRYS 0.6 MG tablet Take 1 tablet (0.6 mg total) by mouth 2 (two) times daily. 12/11/14  Yes Courtney Forcucci, PA-C  CRESTOR 10 MG tablet Take 1 tablet (10 mg total) by mouth at bedtime. 03/01/15  Yes Unk Pinto, MD  EPINEPHrine (EPIPEN 2-PAK) 0.3 mg/0.3 mL IJ SOAJ injection Inject 0.3 mLs (0.3 mg total)  into the muscle once. 03/27/14  Yes Melissa Smith, PA-C  glipiZIDE (GLUCOTROL) 5 MG tablet Take 1 tablet (5 mg total) by mouth 2 (two) times daily. 05/05/16 05/05/17 Yes Vicie Mutters, PA-C  Probiotic Product (PROBIOTIC DAILY PO) Take 1 tablet by mouth daily.   Yes Historical Provider, MD  sitaGLIPtin (JANUVIA) 100 MG tablet Take 1 tablet (100 mg total) by mouth daily. 03/24/16  Yes Courtney Forcucci, PA-C  valsartan-hydrochlorothiazide (DIOVAN-HCT) 320-25 MG tablet TAKE 1 TABLET BY MOUTH DAILY. 12/20/15  Yes Unk Pinto, MD  albuterol (PROVENTIL HFA;VENTOLIN HFA) 108 (90 BASE) MCG/ACT inhaler Take 1 to 2 inhalations 4 x daily or every 4 hours if need to rescue asthma 12/15/14 04/10/16  Unk Pinto, MD  pramipexole (MIRAPEX) 0.25 MG tablet 2 tablets three times a day for 1 month, then take 3 tablets three times a day 05/10/16   Kathrynn Ducking, MD    ROS:  Out of a complete 14 system review of symptoms, the patient complains only of the following symptoms, and all other reviewed systems are negative.  Weakness, tremors  Blood pressure 137/83, pulse 84, height 5\' 7"  (1.702 m), weight 299 lb (135.6 kg).  Physical Exam  General: The patient is alert and cooperative at the time of the examination. The patient has markedly obese.  Skin: No significant peripheral edema is noted.   Neurologic Exam  Mental status: The patient is alert and oriented x 3 at the time of the examination. The patient has apparent normal recent and remote memory, with an apparently normal attention span and concentration ability. Lid lag is seen with downward gaze.   Cranial nerves: Facial symmetry is present. Speech is normal, no aphasia or dysarthria is noted. Extraocular movements are full. Visual fields are full.  Motor: The patient has good strength in all 4 extremities.  Sensory examination: Soft touch sensation is symmetric on the face, arms, and legs.  Coordination: The patient has good  finger-nose-finger and heel-to-shin bilaterally.  Gait and station: The patient has the ability to stand with the arms crossed from a seated position. Once up, the patient will ambulate without assistance, decreased armswing seen on the right. Tandem gait is unremarkable. Romberg is negative. No drift is seen.  Reflexes: Deep tendon reflexes are symmetric on the arms, the left ankle jerk reflex appears to be somewhat elevated.   Assessment/Plan:  1. Parkinson's disease  The patient is on very low-dose Mirapex, he is having increasing difficulty at work. We will increase the Mirapex dosing to 0.5 mg 3 times daily for one month, then go to 0.75 mg 3 times daily for one month, he will contact our office if he is doing well, we will convert him to the 1 mg tablet at that time. A prescription for the  Mirapex 0.25 mg tablets was called in. The patient will follow-up in 4 months.  Jill Alexanders MD 05/10/2016 7:52 AM  [facility] [address] [place],  [address]  Phone [phone] Fax [phone]

## 2016-05-12 ENCOUNTER — Ambulatory Visit: Payer: 59 | Admitting: Occupational Therapy

## 2016-05-12 VITALS — BP 153/78

## 2016-05-12 DIAGNOSIS — R293 Abnormal posture: Secondary | ICD-10-CM

## 2016-05-12 DIAGNOSIS — R29818 Other symptoms and signs involving the nervous system: Secondary | ICD-10-CM

## 2016-05-12 DIAGNOSIS — R278 Other lack of coordination: Secondary | ICD-10-CM

## 2016-05-12 DIAGNOSIS — R29898 Other symptoms and signs involving the musculoskeletal system: Secondary | ICD-10-CM

## 2016-05-12 NOTE — Therapy (Signed)
[facility] [address] [place], [place], [address] Phone: [phone]   Fax:  [phone]  Occupational Therapy Treatment  Patient Details  Name: Todd Rice MRN: [id number] Date of Birth: 1955/11/30 Referring Provider: Dr. [name]  Encounter Date: 05/12/2016      OT End of Session - 05/12/16 1323    Visit Number 13   Number of Visits 17   Date for OT Re-Evaluation 05/17/16   Authorization Type [employer]   OT Start Time 6384   OT Stop Time 1400   OT Time Calculation (min) 43 min   Activity Tolerance Patient tolerated treatment well   Behavior During Therapy [facility] for tasks assessed/performed      Past Medical History:  Diagnosis Date  . Adult BMI 50.0-59.9 kg/sq m    52.77  . Anal fissure   . Anxiety   . Diabetes mellitus without complication (HCC)    borderline  . GERD (gastroesophageal reflux disease)    30 years ago, maybe from peptic ulcer  . H/O: gout    STABLE  . Hepatic steatosis 10/01/11  . History of kidney stones   . Hyperlipidemia   . Hypertension   . Kidney tumor    right upper kidney tumor, monitoring  . OSA on CPAP   . Parkinson disease ([place]) 01/04/2016  . Right ureteral stone   . Vitamin D deficiency   . Weakness     Past Surgical History:  Procedure Laterality Date  . ANAL FISSURE REPAIR  10-12-2000  . COLONOSCOPY N/A 07/01/2012   Procedure: COLONOSCOPY;  Surgeon: Lafayette Dragon, MD;  Location: [facility];  Service: Endoscopy;  Laterality: N/A;  . CYSTOSCOPY WITH RETROGRADE PYELOGRAM, URETEROSCOPY AND STENT PLACEMENT Bilateral 02/26/2013   Procedure: CYSTOSCOPY WITH RETROGRADE PYELOGRAM, URETEROSCOPY AND STENT PLACEMENT;  Surgeon: Alexis Frock, MD;  Location: [facility];  Service: Urology;  Laterality: Bilateral;  . HOLMIUM LASER APPLICATION Bilateral 66/59/9357   Procedure: HOLMIUM LASER APPLICATION;  Surgeon: Alexis Frock, MD;  Location: [facility];  Service: Urology;  Laterality:  Bilateral;  . kidney stone removal     06/2011-stent placement  . LEFT MEDIAL FEMORAL CONDYLE DEBRIDEMENT & DRILLING/ REMOVAL LOOSE BODY  09-15-2003  . NASAL SEPTUM SURGERY  1985  . STONE EXTRACTION WITH BASKET  06/12/2011   Procedure: STONE EXTRACTION WITH BASKET;  Surgeon: Claybon Jabs, MD;  Location: [facility];  Service: Urology;  Laterality: Right;    Vitals:   05/12/16 1322  BP: (!) 153/78        Subjective Assessment - 05/12/16 1543    Subjective  Pt reports MD has upped his dosage of mirapex, pt's dosage will increase again in the beginning of February. Pt is placed on hold and will return to therapy in February once he is at max dosage for seveal weeks.   Patient Stated Goals improve function in right hand   Currently in Pain? No/denies            [facility] OT Assessment - 05/12/16 0001      Observation/Other Assessments   Donning Doffing Jacket Comments 3 button/ unbutton: 32.63 secs         Pt reports slight dizziness today that he attributes to change in medications, pt also reports it may be related to his blood sugar changes after being on prednisone for illness. Reviewed PWR! Basic 4 in seated 10 reps each, min v.c. And demonstration, pt returned demonstration. Therapist checked progress towards remaining  goals. Fine motor coordination task: flipping playing cards, then dealing with his thumb with RUE, min difficulty/ v.c. For techniques. Placing grooved pegs in pegboard for RUE fine motor coordination followed by removing while holding several pegs in hand, mod difficulty/ drops.                   OT Short Term Goals - 04/20/16 1501      OT SHORT TERM GOAL #1   Title I with PD  specific HEP.   Time 4   Period Weeks   Status Achieved     OT SHORT TERM GOAL #2   Title I with adapted strategies for ADLs/ IADLs.   Time 4   Period Weeks   Status Achieved     OT SHORT TERM GOAL #3   Title Pt will demonstrate ability to write a  sentence with 95% legibility, good letter size with only minimal decrease in letter size.   Time 4   Status Achieved     OT SHORT TERM GOAL #4   Title Pt will demonstrate improved RUE functional use as evidenced by increasing box/ blocks score to 43 blocks or greater.   Status Achieved  45 blocks     OT SHORT TERM GOAL #5   Title Pt will increase bilateral standing functional reach to 10 inches or greater bilaterally for decreased fall risk   Baseline RUE 8.5, LUE 9 inches   Time 4   Status Achieved  RUE 10.5 inches, LUE 10 inch           OT Long Term Goals - 05/12/16 1338      OT LONG TERM GOAL #1   Title Pt will verbalize understanding of community resources and ways to prevent future PD related  complications.   Time 8   Period Weeks   Status Achieved     OT LONG TERM GOAL #2   Title Pt will demonstrate improved fine motor coordination as evidenced by decreasing RUE 9 hole peg test to 35 secs or less.   Time 8   Period Weeks   Status Not Met  51.38 secs on 05/12/16     OT LONG TERM GOAL #3   Title Pt will demonstrate improved ability to fasten buttons as evidenced by decreasing 3 button/ unbutton test to 45 secs or less.   Time 8   Period Weeks   Status Achieved  32.63 secs     OT LONG TERM GOAL #4   Title Pt will verbalize understanding of ways to keep thinking skills sharp and ways to compensate for memory changes PRN..   Time 8   Period Weeks   Status Achieved     OT LONG TERM GOAL #5   Title Pt will report UE pain no greater than 3/10 for ADLs/IADLs.   Time 8   Period Weeks   Status Achieved               Plan - 05/12/16 1545    Clinical Impression Statement Pt demonstrates good progress overall yet he reamins limited by bradykinesia and rigidity in right hand. Pt's MD has increased his dosage of medication and pt agrees with plans to place therapy on hold until February oncre he has been taking max dosage for several weeks.   Rehab Potential  Good   OT Frequency 2x / week   OT Duration 8 weeks   OT Treatment/Interventions Self-care/ADL training;Moist Heat;Fluidtherapy;DME and/or AE instruction;Splinting;Patient/family education;Balance training;Therapeutic exercises;Gait Training;Contrast Bath;Ultrasound;Therapeutic  exercise;Therapeutic activities;Cognitive remediation/compensation;Passive range of motion;Functional Mobility Training;Neuromuscular education;Cryotherapy;Electrical Stimulation;Parrafin;Energy conservation;Manual Therapy;Visual/perceptual remediation/compensation   Plan place therapy on hold until end of February   [employer] issued PWR! seated quadraped and coordination HEP, ways to prevent future PD related complications, big movements with ADLs handout   Consulted and Agree with Plan of Care Patient      Patient will benefit from skilled therapeutic intervention in order to improve the following deficits and impairments:  Abnormal gait, Decreased range of motion, Difficulty walking, Impaired flexibility, Decreased safety awareness, Decreased endurance, Decreased activity tolerance, Decreased knowledge of precautions, Impaired tone, Impaired UE functional use, Pain, Decreased balance, Decreased cognition, Decreased mobility, Decreased strength  Visit Diagnosis: Other symptoms and signs involving the musculoskeletal system  Abnormal posture  Other symptoms and signs involving the nervous system  Other lack of coordination    Problem List Patient Active Problem List   Diagnosis Date Noted  . Parkinson disease ([place]) 01/04/2016  . Seasonal and perennial allergic rhinitis 06/21/2014  . Asthma, mild intermittent, well-controlled 06/21/2014  . Pseudomonas aeruginosa colonization 10/08/2012  . Hemorrhage of rectum and anus 07/01/2012  . CAD (coronary artery disease) 11/15/2011  . Hypertension 11/15/2011  . Hyperlipidemia 11/15/2011  . Obesity 11/15/2011  . Ureteral calculus, right 06/12/2011     Asuka Dusseau 05/12/2016, 3:48 PM  [facility] [address] [place], [place], [address] Phone: [phone]   Fax:  [phone]  Name: Todd Rice MRN: [id number] Date of Birth: 04-05-1956

## 2016-05-13 ENCOUNTER — Encounter: Payer: Self-pay | Admitting: *Deleted

## 2016-05-15 ENCOUNTER — Encounter: Payer: Self-pay | Admitting: Internal Medicine

## 2016-05-15 ENCOUNTER — Ambulatory Visit (INDEPENDENT_AMBULATORY_CARE_PROVIDER_SITE_OTHER): Payer: 59 | Admitting: Internal Medicine

## 2016-05-15 VITALS — BP 144/86 | HR 94 | Temp 98.0°F | Resp 18 | Ht 67.0 in | Wt 300.0 lb

## 2016-05-15 DIAGNOSIS — H538 Other visual disturbances: Secondary | ICD-10-CM | POA: Diagnosis not present

## 2016-05-15 LAB — CBC WITH DIFFERENTIAL/PLATELET
BASOS ABS: 0 {cells}/uL (ref 0–200)
BASOS PCT: 0 %
EOS ABS: 85 {cells}/uL (ref 15–500)
Eosinophils Relative: 1 %
HEMATOCRIT: 42.9 % (ref 38.5–50.0)
Hemoglobin: 14.5 g/dL (ref 13.2–17.1)
Lymphocytes Relative: 30 %
Lymphs Abs: 2550 cells/uL (ref 850–3900)
MCH: 27.7 pg (ref 27.0–33.0)
MCHC: 33.8 g/dL (ref 32.0–36.0)
MCV: 81.9 fL (ref 80.0–100.0)
MONO ABS: 850 {cells}/uL (ref 200–950)
MPV: 10 fL (ref 7.5–12.5)
Monocytes Relative: 10 %
NEUTROS ABS: 5015 {cells}/uL (ref 1500–7800)
Neutrophils Relative %: 59 %
Platelets: 165 10*3/uL (ref 140–400)
RBC: 5.24 MIL/uL (ref 4.20–5.80)
RDW: 14.9 % (ref 11.0–15.0)
WBC: 8.5 10*3/uL (ref 3.8–10.8)

## 2016-05-15 MED ORDER — FLUTICASONE PROPIONATE 50 MCG/ACT NA SUSP: 2.0000 | Freq: Every day | NASAL | 0 refills | Status: DC

## 2016-05-15 MED ORDER — AZELASTINE HCL 0.1 % NA SOLN: 2.0000 | Freq: Two times a day (BID) | NASAL | 2 refills | Status: DC

## 2016-05-15 MED ORDER — SITAGLIPTIN PHOSPHATE 100 MG PO TABS: 100.0000 mg | ORAL_TABLET | Freq: Every day | ORAL | 1 refills | Status: DC

## 2016-05-15 NOTE — Progress Notes (Signed)
Subjective:    Patient ID: Todd Rice, male    DOB: [birth year], 61 y.o.   MRN: [id number]  HPI  Patient presents to the office for evaluation of blurry vision.  He reports that he has been having blurred vision nearly constantly since late Thursday.  He notes that he has noticed that he has had to wear his glasses more frequently.  He reports that his eyes have had a crust around his lashes in the morning and has also noticed that he has had some burning sensations.  He has never had any complete loss of his vision.  He notes that he has been taking his blood sugars regularly and has been running around 199 at hte highest.  If he is over 150 he is taking a half tablet of the glipizide.  He has had a mild headache one time while he has had the blurred vision but then it went away.  He notes that he has had some sinus congestion.  He notes that he is chronically congested.  He does have some mild eye dryness.  He has had some itching of his eyes.   He has not had any pain in his eyes.  He reports that he can see well enough to drive.  He usually only needs glasses for reading.  He last had his eyes checked.  They usually dilate his eyes.     Review of Systems  Constitutional: Negative for chills, fatigue and fever.  HENT: Positive for congestion, postnasal drip and rhinorrhea. Negative for ear pain, sinus pain, sinus pressure and tinnitus.   Eyes: Positive for discharge, redness, itching and visual disturbance. Negative for photophobia and pain.  Respiratory: Negative for chest tightness and shortness of breath.   Cardiovascular: Negative for chest pain, palpitations and leg swelling.  Gastrointestinal: Negative for nausea and vomiting.  Genitourinary: Negative.   Musculoskeletal: Negative.   Neurological: Negative for dizziness, speech difficulty, weakness, light-headedness, numbness and headaches.       Objective:   Physical Exam  Constitutional: He is oriented to person, place, and  time. He appears well-developed and well-nourished. No distress.  HENT:  Head: Normocephalic.  Mouth/Throat: Oropharynx is clear and moist. No oropharyngeal exudate.  Eyes: Conjunctivae, EOM and lids are normal. Pupils are equal, round, and reactive to light. Right eye exhibits no chemosis, no discharge, no exudate and no hordeolum. No foreign body present in the right eye. Left eye exhibits no chemosis, no discharge, no exudate and no hordeolum. No foreign body present in the left eye. Right conjunctiva has no hemorrhage. Left conjunctiva has no hemorrhage. No scleral icterus.  Neck: Normal range of motion. Neck supple. No JVD present. No thyromegaly present.  Cardiovascular: Normal rate, regular rhythm, normal heart sounds and intact distal pulses.  Exam reveals no gallop and no friction rub.   No murmur heard. Pulmonary/Chest: Effort normal and breath sounds normal. No respiratory distress. He has no wheezes. He has no rales. He exhibits no tenderness.  Abdominal: Soft. Bowel sounds are normal. He exhibits no distension and no mass. There is no tenderness. There is no rebound and no guarding.  Musculoskeletal: Normal range of motion.  Lymphadenopathy:    He has no cervical adenopathy.  Neurological: He is alert and oriented to person, place, and time. He has normal strength. No cranial nerve deficit or sensory deficit. Coordination normal.  Some cogwheel rigidity.  Mask face present and slow blinking pattern.  Able to look upward for 30 seconds.  Normal cover uncover testing.  Normal visual fields.    Skin: Skin is warm and dry. No rash noted. He is not diaphoretic.  Psychiatric: He has a normal mood and affect. His behavior is normal. Judgment and thought content normal.  Nursing note and vitals reviewed.   Vitals:   05/15/16 1624  BP: (!) 144/86  Pulse: 94  Resp: 18  Temp: 98 F (36.7 C)          Assessment & Plan:    1. Blurred vision  -Patient presents to the office for  evaluation of blurred vision of both eyes.  Normal red reflex.  Given no fluroscein dye in the office cannot check for abrasion or test the clearing rate for dry eye.  Recent brain MRI was normal.  No neuro defects.  There are sinus symptoms present.  No evidence for retinal artery or vein occlusions.  NO bruits of the neck.  Doubt this is TIA.  Given slower blinking pattern and mask face from parkinsons could be possible dry eye vs. Allergic conjunctivitis.  Will try alloway drops with astelin and flonase.  If no relief recommended seeing opthalmologist in 2 days.  Patient declined doing carotid dopplers at this time.  Will also check A1c level as the patient is diabetic and this can be caused by rapid increase of blood sugars.  Have recommended following CBGs closely.  Discussed this patient with Dr. Melford Aase who agreed with the above plan.    - CBC with Differential/Platelet - BASIC METABOLIC PANEL WITH GFR - Hepatic function panel - Hemoglobin A1c - fluticasone (FLONASE) 50 MCG/ACT nasal spray; Place 2 sprays into both nostrils daily.  Dispense: 16 g; Refill: 0 - azelastine (ASTELIN) 0.1 % nasal spray; Place 2 sprays into both nostrils 2 (two) times daily. Use in each nostril as directed  Dispense: 30 mL; Refill: 2

## 2016-05-15 NOTE — Patient Instructions (Signed)
Please try to use systane allergy drops or alloway allergy eye drops.  You can also use just plain lubricating drops in your eyes.  Don't use visene or clear eyes.   I sent Celesta Gentile to the [facility].  I also have given you a prescription card.  Please give that to them to help with cost.   Please drink plenty of water.    Please use flonase 2 sprays per nostril.  Please use astelin 2 sprays per nostril twice daily.  Please get an eye doctor appointment set up if your vision does not improve with 2 days of treatment.

## 2016-05-16 LAB — HEPATIC FUNCTION PANEL
ALBUMIN: 3.9 g/dL (ref 3.6–5.1)
ALK PHOS: 42 U/L (ref 40–115)
ALT: 26 U/L (ref 9–46)
AST: 18 U/L (ref 10–35)
Bilirubin, Direct: 0.1 mg/dL (ref <=0.2)
Indirect Bilirubin: 0.3 mg/dL (ref 0.2–1.2)
TOTAL PROTEIN: 7.3 g/dL (ref 6.1–8.1)
Total Bilirubin: 0.4 mg/dL (ref 0.2–1.2)

## 2016-05-16 LAB — BASIC METABOLIC PANEL WITH GFR
BUN: 18 mg/dL (ref 7–25)
CHLORIDE: 102 mmol/L (ref 98–110)
CO2: 26 mmol/L (ref 20–31)
Calcium: 9.7 mg/dL (ref 8.6–10.3)
Creat: 1.38 mg/dL — ABNORMAL HIGH (ref 0.70–1.25)
GFR, Est African American: 63 mL/min (ref >=60)
GFR, Est Non African American: 55 mL/min — ABNORMAL LOW (ref >=60)
GLUCOSE: 127 mg/dL — AB (ref 65–99)
POTASSIUM: 3.8 mmol/L (ref 3.5–5.3)
Sodium: 138 mmol/L (ref 135–146)

## 2016-05-16 LAB — HEMOGLOBIN A1C
Hgb A1c MFr Bld: 9.1 % — ABNORMAL HIGH (ref <5.7)
Mean Plasma Glucose: 214 mg/dL

## 2016-05-16 MED FILL — AZELASTINE 0.1% (137 MCG) S: 0.1 | 30 days supply | Qty: 30 | Fill #0

## 2016-05-16 MED FILL — JANUVIA 100 MG TABLET: 100 | 90 days supply | Qty: 90 | Fill #0

## 2016-05-16 MED FILL — FLUTICASONE PROP 50 MCG SPR: 50 | 30 days supply | Qty: 16 | Fill #0

## 2016-05-18 ENCOUNTER — Encounter: Payer: Self-pay | Admitting: Internal Medicine

## 2016-05-20 DIAGNOSIS — G4733 Obstructive sleep apnea (adult) (pediatric): Secondary | ICD-10-CM | POA: Diagnosis not present

## 2016-05-24 ENCOUNTER — Ambulatory Visit: Payer: 59

## 2016-05-29 ENCOUNTER — Telehealth: Payer: Self-pay | Admitting: Neurology

## 2016-05-29 MED ORDER — CARBIDOPA-LEVODOPA 25-100 MG PO TABS: 0.5000 | ORAL_TABLET | Freq: Three times a day (TID) | ORAL | 2 refills | Status: DC

## 2016-05-29 MED FILL — CARBIDOPA-LEVODOPA 25-100 T: 25-100 | 30 days supply | Qty: 45 | Fill #0

## 2016-05-29 NOTE — Telephone Encounter (Signed)
I called patient. The patient has tremors that are causing some issues with work I believe as a [employer] fund[employer]. The patient will be going out of work on February 5, we will plan keep him at least 3 months, I will start Sinemet in the meantime, move up his revisit appointment into March 2017.

## 2016-05-29 NOTE — Addendum Note (Signed)
Addended by: Margette Fast on: 05/29/2016 02:13 PM   Modules accepted: Orders

## 2016-05-29 NOTE — Telephone Encounter (Signed)
Patient returning Dr. Willis call. °

## 2016-05-29 NOTE — Telephone Encounter (Signed)
Pt called said [employer] is wanting him to go out on short term disability for right hand.  He wants to know if Dr [name] agrees with this before he starts with FMLA forms. Please call

## 2016-05-29 NOTE — Telephone Encounter (Signed)
I called patient, left a message. If his handwriting has gotten so difficult that he is unable to work, he may have to go out on short-term disability. The patient is only on Mirapex, this may be an indication to accelerate the treatment and had Sinemet now. This should improve the handwriting some, and allow him to get back to work.

## 2016-05-29 NOTE — Telephone Encounter (Signed)
[facility] pt seen for OV on 05/10/16. Having issues w/ his right arm, occ w/ tremor, interfering w/ writing and causing difficulties w/ work. Mirapex was increased to 0.5 mg TID x 1 mo.

## 2016-05-31 NOTE — Telephone Encounter (Signed)
Called pt and scheduled sooner appt in March. Reports that he is doing well on new med so far w/o side effects.

## 2016-06-05 ENCOUNTER — Telehealth: Payer: Self-pay | Admitting: Neurology

## 2016-06-05 NOTE — Telephone Encounter (Signed)
error 

## 2016-06-05 NOTE — Telephone Encounter (Signed)
I called pt and pt's wife. They are agreeable to an appt on 06/19/2016 at 10:00am with Dr. Brett Fairy.  Both understood new appt date and time.

## 2016-06-05 NOTE — Telephone Encounter (Signed)
Pt's wife called said he cannot get off work 1/31 for appt. She said he is available on 2/12,25,19,22 or 26. Please call

## 2016-06-07 ENCOUNTER — Ambulatory Visit: Payer: Self-pay | Admitting: Neurology

## 2016-06-08 ENCOUNTER — Telehealth: Payer: Self-pay | Admitting: Neurology

## 2016-06-08 NOTE — Telephone Encounter (Signed)
Pt asked the wife to call to advise [employer] has not rec'd short term disability form. I advised her the forms are sent from [employer] and the fee is $50. FYI

## 2016-06-09 ENCOUNTER — Ambulatory Visit: Payer: 59

## 2016-06-12 NOTE — Telephone Encounter (Signed)
Pt called requesting status of forms.

## 2016-06-12 NOTE — Telephone Encounter (Signed)
Called and spoke to patient. Short term disability started today. Advised we have not received any paperwork yet. Asked him to contact [employer] and have them re-fax to [phone]. He verbalized understanding.

## 2016-06-13 NOTE — Telephone Encounter (Signed)
Received disability form on desk this am

## 2016-06-14 NOTE — Telephone Encounter (Signed)
Gave completed form to medical records to process.

## 2016-06-15 MED FILL — VALSARTAN-HCTZ 320-25 MG TA: 320-25 | 90 days supply | Qty: 90 | Fill #2

## 2016-06-19 ENCOUNTER — Encounter: Payer: Self-pay | Admitting: Neurology

## 2016-06-19 ENCOUNTER — Ambulatory Visit (INDEPENDENT_AMBULATORY_CARE_PROVIDER_SITE_OTHER): Payer: 59 | Admitting: Neurology

## 2016-06-19 ENCOUNTER — Telehealth: Payer: Self-pay | Admitting: *Deleted

## 2016-06-19 ENCOUNTER — Telehealth: Payer: Self-pay

## 2016-06-19 ENCOUNTER — Telehealth: Payer: Self-pay | Admitting: Neurology

## 2016-06-19 VITALS — BP 144/78 | HR 88 | Resp 18 | Ht 67.0 in | Wt 304.0 lb

## 2016-06-19 DIAGNOSIS — G4733 Obstructive sleep apnea (adult) (pediatric): Secondary | ICD-10-CM

## 2016-06-19 NOTE — Telephone Encounter (Signed)
Dr [name]- please advise. Patient has appt at 10am with Dr Brett Fairy

## 2016-06-19 NOTE — Telephone Encounter (Signed)
Patient states that his medication was changed 3 weeks ago, reports that he is doing a lot better now. Patient feels that he can go back to work. He is asking for a note to go back to work starting 06/26/16. He is here seeing Dr. Brett Fairy and asks if possible to get it before he leaves today.

## 2016-06-19 NOTE — Telephone Encounter (Signed)
I called the patient. He indicates that he has responded quite well to the Sinemet, he believes that his tremors are under good control, he wishes to return to work and full capacity.  I will release him to return to work on 06/26/2016.

## 2016-06-19 NOTE — Telephone Encounter (Signed)
Pt called said he was advised by [employer] that medically necessity forms have been faxed 3 times but have not been faxed back. Please call.

## 2016-06-19 NOTE — Progress Notes (Signed)
SLEEP MEDICINE CLINIC   Provider:  Larey Seat, M D  Referring Provider: Unk Pinto, MD Primary Care Physician:  Alesia Richards, MD  Chief Complaint  Patient presents with  . Follow-up    Rm 11. Patient states that he is doing ok with CPAP. He is having trouble with the mask.     HPI:  CHEO SELVEY is a 61 y.o. male , seen here as a referral  from Dr. Melford Aase for a sleep evaluation,  Mr. Abernathy has just recently met with Dr. Floyde Parkins who diagnosed him with Parkinson's disease and started him on it 3 times daily dose of Sinemet. Meanwhile he has taken 6 doses a day., He has a slightly hoarse voice, pycnic body build, and works for the [employer]. He has a interesting medical history of a long-term Pseudomonas infection contracted during a surgery. He had kidney surgery to clean out the infectious areas. February  2013. He was just diagnosed with a mass on the right kidney.  Sleep study:  On the seventh of every 2007 this patient was evaluated at the [facility] hardened sleep center for CPAP titration. His baseline sleep study could not be located and I have only the technical data to report here. He had a sleep efficiency of 78.6%, 7 apneas and 26 hypopnea spells, he was titrated beginning at 8 cm water with an AHI of 120/h and step-by-step the pressure was increased to 14 cm with an AHI of 2.06 per hour. The oxygen nadir was 85% during REM sleep. I'm still not sure that he was ever completely titrated. He slept supine the entire night.   Sleep habits are as follows: He goes to bed around 10:30 PM, rises at 5:30 AM, interrupted by one bathroom break between 2 and 2:30 AM. The patient prefers to sleep supine, he sleeps on 2 pillows for head support and 1 pillow each side of his body. He is sure to get 7 hours of sleep. He is daytime fatigued. The marital bedroom is described as cool, dark and quiet, profound running in the background.There is no TV in  the bedroom, but the family dog sleeps in the bedroom. He wakes without headaches, has a dry mouth, while on CPAP. His wife has noted him to act out dreams, banging or punching on a pillow, loud yelling in his sleep, defending himself. He doesn't frequently . wake up from this activity. The dreams are sometimes so real that they scare him. It's hard to differentiate dream from reality. family sleep history: No OSA and No PD.  Social history: married, worked in health care, no caffeine, no tobacco, no ETOH.   1) Mr. Meuser suffers from Parkinson's disease which was recently diagnosed ( 2017 ) and he is on an ascending dose titration to carbidopa levodopa. He has noted benefit in terms of less rigidity when waking up in the morning and improved range of motion for his neck, he does not suffer from tremor but rather feels that he has some clumsiness and loss of fine motor skills with his right, dominant hand. He feels better and tremor is controlled. Still dysphonic.  2) Mr. Casillas was diagnosed with obstructive sleep apnea also his baseline sleep study could  not been retrieved, Based on those data I know that he was titrated to 14 cm water but incomplete resolution of apnea was also noted his residual AHI was still 2.6 he still had hypoxemia with a nadir of 85% oxygen during  REM sleep, and he has not had downloads nor regular follow-ups on CPAP. 3) social his wife has not just witnessed that her husband was apneic and snore before using CPAP but she has also noted that he has developed the tendency to act out dreams at times sometimes very vivid and lifelike dreams. REM behavior disorder seems to have started just a couple of years ago. It has a 70% prediction for male patients to progress into Parkinson's disease. 4) morbid obesity   Interval history from the 12th of Feb. 2018, I had the pleasure to day of seeing Mr. Backstrom again, wed undergone a split night polysomnography at [place] on 02/24/2016. He was still diagnosed with a high degree of apnea his AHI was 33.6 he slept all night in supine position and he did not enter REM sleep. Oxygen nadir was 88% is only 2 minutes of desaturation and remarkably this patient, who suffers from Parkinson's disease, did not have periodic limb movements. He was titrated to CPAP up to 18 cm water pressure without resolution of apnea. The medical technologist changed him to a BiPAP setting which did resolve apnea at a setting of 20/16 cm water overall he did not sleep very well in the laboratory environment but he would finally able to find a mask that seem to fit him well-  an [employer]. I'm able today to see his compliance download which has a remarkable 100% of use for the last 30 days with 75% compliance for over 4 hours of nightly use consecutively. His minimum inspiratory pressure is 17 and expiratory pressure 10 cm water, 4 cm pressure support, residual AHI is 2.1 which is a very good resolution. There were no treatment emergent central apneas on the setting.  Blood pressure (!) 140/98, pulse 64, resp. rate 20, height 5' 7" (1.702 m), weight (!) 303 lb (137.4 kg).  Physical Exam  Mr. Badie has a high-grade Mallampati, a 22 inch neck circumference, no retrognathia, intact shoulder shrug bilaterally. He has a masked face and hoarse voice.    General: The patient is alert and cooperative at the time of the examination. The patient is markedly obese. Eyes: Pupils are equal, round, and reactive to light. Discs are flat bilaterally. Neck: stiffness , restricted ROM.  no carotid bruits are noted. Respiratory: The respiratory examination is clear. Cardiovascular: regular rate and rhythm, no obvious murmurs or rubs are noted. Skin: Extremities are without significant edema. Neurologic Exam  Mental status: The patient is alert and oriented x 3 , apparent normal recent and remote memory, with normal attention span . Cranial  nerves:  Taste and smell preserved, There is good sensation  to pinprick and soft touch bilaterally. The strength of the facial muscles and the muscles to head turning and shoulder shrug are normal bilaterally.  Extraocular movements are full but no fluent, smooth, . Visual fields are full.  The tongue is midline, and the patient has symmetric elevation of the soft palate. No  hearing deficits are noted. Masking of the face is seen. The patient appears to have lid lag with inferior gaze. Motor:  5 / 5 strength  - elevated  motor tone is noted throughout, with cogwheeling , Right hand is  slowed- handwriting is now normalized-  key turning are difficult.  Sensory:  intact to pinprick, soft touch, vibration sensation. Coordination:  finger-nose-finger bilaterally intact, no resting tremor !.  Gait and station: The patient is able to rise from a seated  position without bracing / keeping his arms crossed and he is able to ambulate independently.  The patient has decreased arm swing bilaterally, most prominent on the right. No tremor seen.  Romberg is negative. No drift is seen.  Reflexes: Deep tendon reflexes are symmetric and normal bilaterally.   Epworth score 3 from 4 , Fatigue severity score 13  , depression score 6/15,  Patient wants to go back to work.    Social History   Social History  . Marital status: Married    Spouse name: N/A  . Number of children: 0  . Years of education: N/A   Occupational History  . Not on file.   Social History Main Topics  . Smoking status: Never Smoker  . Smokeless tobacco: Never Used  . Alcohol use No  . Drug use: No     Comment: QUIT SMOKING " POT" 40 YRS AGO  . Sexual activity: Not on file   Other Topics Concern  . Not on file   Social History Narrative   Right-handed.   No caffeine use.   Lives at home with his wife.    Past Medical History:  Diagnosis Date  . Adult BMI 50.0-59.9 kg/sq m    52.77  . Anal fissure   . Anxiety   .  Diabetes mellitus without complication (HCC)    borderline  . GERD (gastroesophageal reflux disease)    30 years ago, maybe from peptic ulcer  . H/O: gout    STABLE  . Hepatic steatosis 10/01/11  . History of kidney stones   . Hyperlipidemia   . Hypertension   . Kidney tumor    right upper kidney tumor, monitoring  . OSA on CPAP   . Parkinson disease ([place]) 01/04/2016  . Right ureteral stone   . Vitamin D deficiency   . Weakness     Current Outpatient Prescriptions  Medication Sig Dispense Refill  . allopurinol (ZYLOPRIM) 300 MG tablet TAKE 1 TABLET BY MOUTH DAILY. 90 tablet 2  . ALPRAZolam (XANAX) 0.5 MG tablet TAKE 1 TABLET BY MOUTH 3 TIMES DAILY 90 tablet 2  . aspirin EC 81 MG tablet Take 81 mg by mouth at bedtime.    . carbidopa-levodopa (SINEMET IR) 25-100 MG tablet Take 0.5 tablets by mouth 3 (three) times daily. 50 tablet 2  . COLCRYS 0.6 MG tablet Take 1 tablet (0.6 mg total) by mouth 2 (two) times daily. 180 tablet 12  . CRESTOR 10 MG tablet Take 1 tablet (10 mg total) by mouth at bedtime. 90 tablet 3  . EPINEPHrine (EPIPEN 2-PAK) 0.3 mg/0.3 mL IJ SOAJ injection Inject 0.3 mLs (0.3 mg total) into the muscle once. 2 Device 1  . Probiotic Product (PROBIOTIC DAILY PO) Take 1 tablet by mouth daily.    . sitaGLIPtin (JANUVIA) 100 MG tablet Take 1 tablet (100 mg total) by mouth daily. 90 tablet 1  . valsartan-hydrochlorothiazide (DIOVAN-HCT) 320-25 MG tablet TAKE 1 TABLET BY MOUTH DAILY. 90 tablet 2  . albuterol (PROVENTIL HFA;VENTOLIN HFA) 108 (90 BASE) MCG/ACT inhaler Take 1 to 2 inhalations 4 x daily or every 4 hours if need to rescue asthma 18 g 99   No current facility-administered medications for this visit.     Allergies as of 06/19/2016 - Review Complete 06/19/2016  Allergen Reaction Noted  . Ceftriaxone Hives 04/11/2016    Vitals: BP (!) 144/78   Pulse 88   Resp 18   Ht 5' 7" (1.702 m)   Annell Greening Marland Kitchen)  304 lb (137.9 kg)   BMI 47.61 kg/m  Last Weight:  Wt Readings  from Last 1 Encounters:  06/19/16 (!) 304 lb (137.9 kg)   WOE:HOZY mass index is 47.61 kg/m.     Last Height:   Ht Readings from Last 1 Encounters:  06/19/16 5' 7" (1.702 m)     The patient was advised of the nature of the diagnosed sleep disorder , the treatment options and risks for general a health and wellness arising from not treating the condition.  I spent more than 50 minutes of face to face time with the patient. Greater than 50% of time was spent in counseling and coordination of care. We have discussed the diagnosis and differential and I answered the patient's questions.     Assessment:  After physical and neurologic examination, review of laboratory studies,  Personal review of imaging studies, reports of other /same  Imaging studies ,  Results of polysomnography/ neurophysiology testing and pre-existing records as far as provided in visit., my assessment is   1) Mr. Cunnington suffers from Parkinson's disease which was recently diagnosed ( 2017 ) and he is on an ascending dose titration to carbidopa levodopa. He has noted benefit in terms of less rigidity when waking up in the morning and improved range of motion for his neck, he does not suffer from tremor but rather feels that he has some clumsiness and loss of fine motor skills with his right, dominant hand. He feels better and tremor is controlled. Still dysphonic. He is willing to go back to work and keen on it.   2) Mr. Borge was diagnosed with obstructive sleep apnea also his baseline sleep study could  not been retrieved, Based on those data I know that he was titrated to 14 cm water but incomplete resolution of apnea was also noted his residual AHI was still 2.6 he still had hypoxemia with a nadir of 85% oxygen during REM sleep, and he has not had downloads nor regular follow-ups on CPAP prior to now.  I will repeat sleep study showed that the patient indeed has significant and severe obstructive sleep apnea there were no  central apneas noted, the patient was titrated to CPAP but failed, simply BiPAP also did not work for him. What has worked is the  Teacher, early years/pre with a setting of maximal inspiratory pressure of 17cm ,  minimum expiratory pressure of 10 and pressure support of 4 cm water and a residual AHI of 2.1, compliance of 75% over 4 hours 100% of the last 30 days. I encouraged the patient to use the machine every night over 4 hours  3 REM behavior disorder seems to have started just a couple of years ago. It has a 70% prediction for male patients to progress into Parkinson's disease.  4) morbid obesity    Plan:  Treatment plan and additional workup :  I think he is doing well and given his good response I would find him able to return to work. I will send a message to Dr. 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 He has finished  OT.  Sleep Rv in 12 month,  Dr. [name] will see patient March 1st for return to work evaluation.    Asencion Partridge Dohmeier MD  06/19/2016   CC: Unk Pinto, [facility]  [place], [place] [address]   Lenor Coffin, MD

## 2016-06-19 NOTE — Telephone Encounter (Signed)
Pt [employer]  form faxed to [id number] claim # [id number].

## 2016-06-19 NOTE — Telephone Encounter (Signed)
Todd Rice- here is the phone note if you can contact patient?  You said you tried reaching pt to sign release form/give payment but unable to reach him last week. Thank you!

## 2016-06-20 DIAGNOSIS — G4733 Obstructive sleep apnea (adult) (pediatric): Secondary | ICD-10-CM | POA: Diagnosis not present

## 2016-06-20 DIAGNOSIS — Z0289 Encounter for other administrative examinations: Secondary | ICD-10-CM

## 2016-06-20 NOTE — Telephone Encounter (Signed)
Called and LVM for pt advising letter completed per request and up front for pick up. Gave [facility] phone number if he has further questions or concerns.

## 2016-06-22 DIAGNOSIS — G4733 Obstructive sleep apnea (adult) (pediatric): Secondary | ICD-10-CM | POA: Diagnosis not present

## 2016-06-29 MED FILL — ALPRAZolam 0.5 MG TABS: 0.5 | 30 days supply | Qty: 90 | Fill #1

## 2016-06-29 MED FILL — CARBIDOPA-LEVODOPA 25-100 T: 25-100 | 30 days supply | Qty: 45 | Fill #1

## 2016-07-05 ENCOUNTER — Ambulatory Visit: Payer: 59 | Attending: Neurology | Admitting: Occupational Therapy

## 2016-07-05 DIAGNOSIS — R278 Other lack of coordination: Secondary | ICD-10-CM | POA: Diagnosis not present

## 2016-07-05 DIAGNOSIS — R29818 Other symptoms and signs involving the nervous system: Secondary | ICD-10-CM | POA: Insufficient documentation

## 2016-07-05 DIAGNOSIS — R2681 Unsteadiness on feet: Secondary | ICD-10-CM | POA: Diagnosis not present

## 2016-07-05 DIAGNOSIS — R29898 Other symptoms and signs involving the musculoskeletal system: Secondary | ICD-10-CM | POA: Diagnosis not present

## 2016-07-05 DIAGNOSIS — R2689 Other abnormalities of gait and mobility: Secondary | ICD-10-CM | POA: Insufficient documentation

## 2016-07-05 DIAGNOSIS — R293 Abnormal posture: Secondary | ICD-10-CM | POA: Insufficient documentation

## 2016-07-05 NOTE — Therapy (Signed)
[facility] [address] [place], [place], [address] Phone: [phone]   Fax:  [phone]  Occupational Therapy Treatment  Patient Details  Name: Todd Rice MRN: [id number] Date of Birth: Apr 15, 1956 Referring Provider: Dr. [name]  Encounter Date: 07/05/2016      OT End of Session - 07/05/16 1433    Visit Number 1   Number of Visits 6   Date for OT Re-Evaluation 09/01/16   Authorization Type [employer]   OT Start Time 1313   OT Stop Time 1353   OT Time Calculation (min) 40 min   Activity Tolerance Patient tolerated treatment well   Behavior During Therapy [facility] for tasks assessed/performed      Past Medical History:  Diagnosis Date  . Adult BMI 50.0-59.9 kg/sq m    52.77  . Anal fissure   . Anxiety   . Diabetes mellitus without complication (HCC)    borderline  . GERD (gastroesophageal reflux disease)    30 years ago, maybe from peptic ulcer  . H/O: gout    STABLE  . Hepatic steatosis 10/01/11  . History of kidney stones   . Hyperlipidemia   . Hypertension   . Kidney tumor    right upper kidney tumor, monitoring  . OSA on CPAP   . Parkinson disease ([place]) 01/04/2016  . Right ureteral stone   . Vitamin D deficiency   . Weakness     Past Surgical History:  Procedure Laterality Date  . ANAL FISSURE REPAIR  10-12-2000  . COLONOSCOPY N/A 07/01/2012   Procedure: COLONOSCOPY;  Surgeon: Lafayette Dragon, MD;  Location: [facility];  Service: Endoscopy;  Laterality: N/A;  . CYSTOSCOPY WITH RETROGRADE PYELOGRAM, URETEROSCOPY AND STENT PLACEMENT Bilateral 02/26/2013   Procedure: CYSTOSCOPY WITH RETROGRADE PYELOGRAM, URETEROSCOPY AND STENT PLACEMENT;  Surgeon: Alexis Frock, MD;  Location: [facility];  Service: Urology;  Laterality: Bilateral;  . HOLMIUM LASER APPLICATION Bilateral [id number]   Procedure: HOLMIUM LASER APPLICATION;  Surgeon: Alexis Frock, MD;  Location: [facility];  Service: Urology;  Laterality:  Bilateral;  . kidney stone removal     06/2011-stent placement  . LEFT MEDIAL FEMORAL CONDYLE DEBRIDEMENT & DRILLING/ REMOVAL LOOSE BODY  09-15-2003  . NASAL SEPTUM SURGERY  1985  . STONE EXTRACTION WITH BASKET  06/12/2011   Procedure: STONE EXTRACTION WITH BASKET;  Surgeon: Claybon Jabs, MD;  Location: [facility];  Service: Urology;  Laterality: Right;    There were no vitals filed for this visit.      Subjective Assessment - 07/05/16 1315    Subjective  Pt with PD reports that MD started him on sinemet on Jun 08, 2016, pt reports a postive response   Patient is accompained by: Family member   Patient Stated Goals improve function in right hand   Currently in Pain? 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  occasional difficulties with IADLS/ work activities      Written Expression   Dominant Hand Right   Handwriting Mild micrographia  decreased letter size at the end of a sentence     Observation/Other Assessments   Standing Functional Reach Test RUE 10.5 inches, LUE 10.5 inches   Simulated Eating Time (seconds) 13.71 secs   Donning Doffing Jacket Comments 3 button/ unbutton: 31.69 secs      Coordination   Right 9 Hole Peg Test 32.09 secs   Left 9 Hole Peg Test 28.25 secs    Coordination impaired for RUE fine motor coordination             Arm bike x 6 mins  level 1 for conditioning, pt maintained 40 rpm Rotating ball in finger tips for incr. Fine motor coordination, min difficulty for LUE, mod difficulty with LUE.                  OT Long Term Goals - 07/05/16 1347      OT LONG TERM GOAL #1   Title Pt will demonstrate understanding of PD specific HEP following review.   Time 8   Period Weeks   Status New     OT LONG TERM GOAL #2   Title Pt will verbalize understanding of updated adapted strategies for ADLs/IADLs, and work activities.   Time 8   Period Weeks   Status New     OT LONG TERM GOAL #3   Title Pt will demonstrate ability to print a short paragarph with 100%    Time 8   Period Weeks   Status New     OT LONG TERM GOAL #4   Title Pt will improve 3 button/ unbutton to 28 secs or less   Time 8   Period Weeks   Status New               Plan - 07/05/16 1345    Clinical Impression Statement Pt with Parkinson's disease returns to occupational therapy after starting sinemet on February 1 , 2018. Pt demonstrates a positive response to sinement, however he can benefit from from occupational therapy to address bradykinesia, decreased coordination, decreased balance, rigidity in order to maximize functional indpdnence with ADLs/ IADLS.,    Rehab Potential Good   OT Frequency --  6 visits x 8 weeks   OT Duration 8 weeks   OT Treatment/Interventions Self-care/ADL training;Moist Heat;Fluidtherapy;DME and/or AE instruction;Splinting;Patient/family education;Balance training;Therapeutic exercises;Gait Training;Contrast Bath;Ultrasound;Therapeutic exercise;Therapeutic activities;Cognitive remediation/compensation;Passive range of motion;Functional Mobility Training;Neuromuscular education;Cryotherapy;Electrical Stimulation;Parrafin;Energy conservation;Manual Therapy;Visual/perceptual remediation/compensation   Plan work towards updated goals   OT Home Exercise Plan issued PWR! seated quadraped and coordination HEP, ways  to prevent future PD related complications, big movements with ADLs handout   Consulted and Agree with Plan of Care Patient      Patient will benefit from skilled therapeutic intervention in order to improve the following deficits and impairments:  Abnormal gait, Decreased range of motion, Difficulty walking, Impaired flexibility, Decreased safety awareness, Decreased endurance, Decreased activity tolerance, Decreased knowledge of precautions, Impaired tone, Impaired UE functional use, Pain, Decreased balance, Decreased cognition, Decreased mobility, Decreased strength  Visit Diagnosis: Other lack of coordination - Plan: Ot plan of care cert/re-cert  Abnormal posture - Plan: Ot plan of care cert/re-cert  Other symptoms and signs involving the nervous system - Plan: Ot plan of care cert/re-cert  Other  symptoms and signs involving the musculoskeletal system - Plan: Ot plan of care cert/re-cert  Other abnormalities of gait and mobility - Plan: Ot plan of care cert/re-cert  Unsteadiness on feet - Plan: Ot plan of care cert/re-cert    Problem List Patient Active Problem List   Diagnosis Date Noted  . Parkinson disease ([place]) 01/04/2016  . Seasonal and perennial allergic rhinitis 06/21/2014  . Asthma, mild intermittent, well-controlled 06/21/2014  . Pseudomonas aeruginosa colonization 10/08/2012  . Hemorrhage of rectum and anus 07/01/2012  . CAD (coronary artery disease) 11/15/2011  . Hypertension 11/15/2011  . Hyperlipidemia 11/15/2011  . Obesity 11/15/2011  . Ureteral calculus, right 06/12/2011    Zoriyah Scheidegger 07/05/2016, 4:53 PM  [facility] [address] [place], [place], [address] Phone: [phone]   Fax:  [phone]  Name: Todd Rice MRN: [id number] Date of Birth: [birth year]

## 2016-07-06 ENCOUNTER — Encounter: Payer: Self-pay | Admitting: Neurology

## 2016-07-06 ENCOUNTER — Ambulatory Visit (INDEPENDENT_AMBULATORY_CARE_PROVIDER_SITE_OTHER): Payer: 59 | Admitting: Neurology

## 2016-07-06 VITALS — BP 132/72 | HR 77 | Ht 67.0 in | Wt 305.0 lb

## 2016-07-06 DIAGNOSIS — G20 Parkinson's disease: Secondary | ICD-10-CM

## 2016-07-06 MED ORDER — PRAMIPEXOLE DIHYDROCHLORIDE 0.25 MG PO TABS: 0.2500 mg | ORAL_TABLET | Freq: Three times a day (TID) | ORAL | Status: DC

## 2016-07-06 NOTE — Patient Instructions (Signed)
   Go back on mirpex 0.25 mg one three times a day.

## 2016-07-06 NOTE — Progress Notes (Signed)
Reason for visit: Parkinson's disease  Todd Rice is an 61 y.o. male  History of present illness:  [name] is a 61 year old right-handed white male with a history of Parkinson's disease, morbid obesity, and sleep apnea. The patient has returned back to work, the Sinemet medication has helped his hand functioning. The patient has been followed through occupational therapy and he claims that he has done well with this. The patient denies any falls, he is on BiPAP at night, he is sleeping relatively well. The patient does have some mild fatigue during the day. He denies any difficulty with swallowing or choking. He returns to the office today for an evaluation.   Past Medical History:  Diagnosis Date  . Adult BMI 50.0-59.9 kg/sq m    52.77  . Anal fissure   . Anxiety   . Diabetes mellitus without complication (HCC)    borderline  . GERD (gastroesophageal reflux disease)    30 years ago, maybe from peptic ulcer  . H/O: gout    STABLE  . Hepatic steatosis 10/01/11  . History of kidney stones   . Hyperlipidemia   . Hypertension   . Kidney tumor    right upper kidney tumor, monitoring  . OSA on CPAP   . Parkinson disease ([place]) 01/04/2016  . Right ureteral stone   . Vitamin D deficiency   . Weakness     Past Surgical History:  Procedure Laterality Date  . ANAL FISSURE REPAIR  10-12-2000  . COLONOSCOPY N/A 07/01/2012   Procedure: COLONOSCOPY;  Surgeon: Lafayette Dragon, MD;  [facility];  Service: Endoscopy;  Laterality: N/A;  . CYSTOSCOPY WITH RETROGRADE PYELOGRAM, URETEROSCOPY AND STENT PLACEMENT Bilateral 02/26/2013   Procedure: CYSTOSCOPY WITH RETROGRADE PYELOGRAM, URETEROSCOPY AND STENT PLACEMENT;  Surgeon: Alexis Frock, MD;  [facility];  Service: Urology;  Laterality: Bilateral;  . HOLMIUM LASER APPLICATION Bilateral [id number]   Procedure: HOLMIUM LASER APPLICATION;  Surgeon: Alexis Frock, MD;  [facility];  Service: Urology;  Laterality:  Bilateral;  . kidney stone removal     06/2011-stent placement  . LEFT MEDIAL FEMORAL CONDYLE DEBRIDEMENT & DRILLING/ REMOVAL LOOSE BODY  09-15-2003  . NASAL SEPTUM SURGERY  1985  . STONE EXTRACTION WITH BASKET  06/12/2011   Procedure: STONE EXTRACTION WITH BASKET;  Surgeon: Claybon Jabs, MD;  Location: [facility];  Service: Urology;  Laterality: Right;    Family History  Problem Relation Age of Onset  . Heart disease Mother     Atrial fibrillation  . Hypertension Mother   . Hyperlipidemia Mother   . Lung cancer Father     was a smoker  . Cancer Father     lung  . Hyperlipidemia Brother   . Heart disease Maternal Aunt   . Hyperlipidemia Maternal Aunt   . Hypertension Maternal Aunt   . Stroke Maternal Aunt   . Heart disease Paternal Grandmother   . Hyperlipidemia Paternal Grandmother     Social history:  reports that he has never smoked. He has never used smokeless tobacco. He reports that he does not drink alcohol or use drugs.    Allergies  Allergen Reactions  . Ceftriaxone Hives    Medications:  Prior to Admission medications   Medication Sig Start Date End Date Taking? Authorizing Provider  allopurinol (ZYLOPRIM) 300 MG tablet TAKE 1 TABLET BY MOUTH DAILY. 12/20/15  Yes Unk Pinto, MD  ALPRAZolam Duanne Moron) 0.5 MG tablet TAKE 1 TABLET BY MOUTH 3  TIMES DAILY 03/13/16  Yes Unk Pinto, MD  aspirin EC 81 MG tablet Take 81 mg by mouth at bedtime.   Yes Historical Provider, MD  carbidopa-levodopa (SINEMET IR) 25-100 MG tablet Take 0.5 tablets by mouth 3 (three) times daily. 05/29/16  Yes Kathrynn Ducking, MD  COLCRYS 0.6 MG tablet Take 1 tablet (0.6 mg total) by mouth 2 (two) times daily. 12/11/14  Yes Courtney Forcucci, PA-C  CRESTOR 10 MG tablet Take 1 tablet (10 mg total) by mouth at bedtime. 03/01/15  Yes Unk Pinto, MD  EPINEPHrine (EPIPEN 2-PAK) 0.3 mg/0.3 mL IJ SOAJ injection Inject 0.3 mLs (0.3 mg total) into the muscle once. 03/27/14  Yes  Melissa Smith, PA-C  Probiotic Product (PROBIOTIC DAILY PO) Take 1 tablet by mouth daily.   Yes Historical Provider, MD  sitaGLIPtin (JANUVIA) 100 MG tablet Take 1 tablet (100 mg total) by mouth daily. 05/15/16  Yes Courtney Forcucci, PA-C  valsartan-hydrochlorothiazide (DIOVAN-HCT) 320-25 MG tablet TAKE 1 TABLET BY MOUTH DAILY. 12/20/15  Yes Unk Pinto, MD  albuterol (PROVENTIL HFA;VENTOLIN HFA) 108 (90 BASE) MCG/ACT inhaler Take 1 to 2 inhalations 4 x daily or every 4 hours if need to rescue asthma 12/15/14 04/10/16  Unk Pinto, MD  pramipexole (MIRAPEX) 0.25 MG tablet Take 1 tablet (0.25 mg total) by mouth 3 (three) times daily. 07/06/16   Kathrynn Ducking, MD    ROS:  Out of a complete 14 system review of symptoms, the patient complains only of the following symptoms, and all other reviewed systems are negative.  Rectal bleeding  Blood pressure 132/72, pulse 77, height 5\' 7"  (1.702 m), weight (!) 305 lb (138.3 kg).  Physical Exam  General: The patient is alert and cooperative at the time of the examination. The patient is morbidly obese.  Skin: 2+ edema of ankles is noted bilaterally.   Neurologic Exam  Mental status: The patient is alert and oriented x 3 at the time of the examination. The patient has apparent normal recent and remote memory, with an apparently normal attention span and concentration ability.   Cranial nerves: Facial symmetry is present. Speech is normal, no aphasia or dysarthria is noted. Extraocular movements are full. Visual fields are full. Masking of the face is seen.  Motor: The patient has good strength in all 4 extremities.  Sensory examination: Soft touch sensation is symmetric on the face, arms, and legs.  Coordination: The patient has good finger-nose-finger and heel-to-shin bilaterally.  Gait and station: The patient has a normal gait, he appears to have symmetric arm swing. Tandem gait is normal. Romberg is negative. No drift is  seen.  Reflexes: Deep tendon reflexes are symmetric.   Assessment/Plan:  1. Parkinson's disease  2. Morbid obesity  3. Sleep apnea on BiPAP  The patient has apparently has come off of the Mirapex, he will go back on a low-dose taking 0.25 mg 3 times daily. He will remain on low-dose Sinemet, I encouraged him to continue to exercise on a regular basis. He will follow-up in 5 months, sooner if needed.  Jill Alexanders MD 07/06/2016 8:39 AM  [facility] [address] [place], [place] [address]  Phone [phone] Fax [phone]

## 2016-07-12 ENCOUNTER — Ambulatory Visit: Payer: 59 | Attending: Neurology | Admitting: Occupational Therapy

## 2016-07-12 DIAGNOSIS — R278 Other lack of coordination: Secondary | ICD-10-CM | POA: Insufficient documentation

## 2016-07-12 DIAGNOSIS — R29898 Other symptoms and signs involving the musculoskeletal system: Secondary | ICD-10-CM | POA: Insufficient documentation

## 2016-07-12 DIAGNOSIS — R29818 Other symptoms and signs involving the nervous system: Secondary | ICD-10-CM | POA: Insufficient documentation

## 2016-07-13 ENCOUNTER — Ambulatory Visit (INDEPENDENT_AMBULATORY_CARE_PROVIDER_SITE_OTHER): Payer: 59 | Admitting: Internal Medicine

## 2016-07-13 ENCOUNTER — Encounter: Payer: Self-pay | Admitting: Internal Medicine

## 2016-07-13 VITALS — BP 128/76 | HR 64 | Temp 97.7°F | Resp 16 | Ht 67.0 in | Wt 302.6 lb

## 2016-07-13 DIAGNOSIS — E6609 Other obesity due to excess calories: Secondary | ICD-10-CM | POA: Diagnosis not present

## 2016-07-13 DIAGNOSIS — E782 Mixed hyperlipidemia: Secondary | ICD-10-CM | POA: Diagnosis not present

## 2016-07-13 DIAGNOSIS — Z6841 Body Mass Index (BMI) 40.0 and over, adult: Secondary | ICD-10-CM | POA: Diagnosis not present

## 2016-07-13 DIAGNOSIS — IMO0001 Reserved for inherently not codable concepts without codable children: Secondary | ICD-10-CM

## 2016-07-13 DIAGNOSIS — E559 Vitamin D deficiency, unspecified: Secondary | ICD-10-CM | POA: Diagnosis not present

## 2016-07-13 DIAGNOSIS — Z79899 Other long term (current) drug therapy: Secondary | ICD-10-CM

## 2016-07-13 DIAGNOSIS — I10 Essential (primary) hypertension: Secondary | ICD-10-CM | POA: Diagnosis not present

## 2016-07-13 DIAGNOSIS — E119 Type 2 diabetes mellitus without complications: Secondary | ICD-10-CM

## 2016-07-13 DIAGNOSIS — M109 Gout, unspecified: Secondary | ICD-10-CM | POA: Diagnosis not present

## 2016-07-13 LAB — CBC WITH DIFFERENTIAL/PLATELET
BASOS PCT: 0 %
Basophils Absolute: 0 cells/uL (ref 0–200)
EOS PCT: 2 %
Eosinophils Absolute: 206 cells/uL (ref 15–500)
HEMATOCRIT: 45.1 % (ref 38.5–50.0)
Hemoglobin: 14.9 g/dL (ref 13.2–17.1)
LYMPHS PCT: 25 %
Lymphs Abs: 2575 cells/uL (ref 850–3900)
MCH: 26.8 pg — ABNORMAL LOW (ref 27.0–33.0)
MCHC: 33 g/dL (ref 32.0–36.0)
MCV: 81.1 fL (ref 80.0–100.0)
MONO ABS: 927 {cells}/uL (ref 200–950)
MONOS PCT: 9 %
MPV: 10.7 fL (ref 7.5–12.5)
NEUTROS PCT: 64 %
Neutro Abs: 6592 cells/uL (ref 1500–7800)
PLATELETS: 192 10*3/uL (ref 140–400)
RBC: 5.56 MIL/uL (ref 4.20–5.80)
RDW: 14.7 % (ref 11.0–15.0)
WBC: 10.3 10*3/uL (ref 3.8–10.8)

## 2016-07-13 LAB — TSH: TSH: 3.41 m[IU]/L (ref 0.40–4.50)

## 2016-07-13 NOTE — Patient Instructions (Signed)

## 2016-07-13 NOTE — Progress Notes (Signed)
Todd Rice Unk Pinto, M.D.        Todd Rice. Todd Rice, P.A.-C       [name], P.A.-C  [facility]                [address] [place] [address] Telephone [phone] Telefax [phone] ______________________________________________________________________     This very nice 61 y.o. MWM presents for 3 month follow up with Hypertension, Hyperlipidemia, T2_NIDDM and Vitamin D Deficiency. Patient has recently been dx'd and is treated by Dr Todd Rice  for Parkinson's Disease. He also is followed by Dr Todd Rice for OSAS Todd Rice. Patient also has a neoplasm in the Right Kidney followed conservatively by Dr Todd Rice since July 2014 by active surveillance and f/u due in Apr/May 2018.      Patient is treated for HTN (1998) & BP has been controlled at home. Today's BP is at goal - 128/76. Patient has had no complaints of any cardiac type chest pain, palpitations, dyspnea/orthopnea/PND, dizziness, claudication, or dependent edema.     Hyperlipidemia is controlled with diet & meds. Patient denies myalgias or other med SE's. Last Lipids were at goal albeit elevated Trig's: Lab Results  Component Value Date   CHOL 160 03/22/2016   HDL 33 (L) 03/22/2016   LDLCALC 75 03/22/2016   TRIG 262 (H) 03/22/2016   CHOLHDL 4.8 03/22/2016      Also, the patient has history of Severe Morbid Obesity (BMI 47+) and consequent T2_NIDDM  since 2013 and Metformin was started in 2014 and in Nov,2017,  Januvia was added as his A1c was 7.4% and he apparently misunderstood and stopped Metformin and in January his A1c was up to 9.1%.  He denies symptoms of reactive hypoglycemia, diabetic polys, paresthesias or visual blurring.      Further, the patient also has history of Vitamin D Deficiency ("18" in 2010) and does not supplement vitamin D as recommended in the past. Last vitamin D was still extremely low: Lab Results   Component Value Date   VD25OH 28 (L) 12/17/2015   Current Outpatient Prescriptions on File Prior to Visit  Medication Sig  . allopurinol (ZYLOPRIM) 300 MG tablet TAKE 1 TABLET BY MOUTH DAILY.  Marland Kitchen ALPRAZolam (XANAX) 0.5 MG tablet TAKE 1 TABLET BY MOUTH 3 TIMES DAILY  . aspirin EC 81 MG tablet Take 81 mg by mouth at bedtime.  . carbidopa-levodopa (SINEMET IR) 25-100 MG tablet Take 0.5 tablets by mouth 3 (three) times daily.  Marland Kitchen COLCRYS 0.6 MG tablet Take 1 tablet (0.6 mg total) by mouth 2 (two) times daily.  . CRESTOR 10 MG tablet Take 1 tablet (10 mg total) by mouth at bedtime.  Marland Kitchen EPINEPHrine (EPIPEN 2-PAK) 0.3 mg/0.3 mL IJ SOAJ injection Inject 0.3 mLs (0.3 mg total) into the muscle once.  . pramipexole (MIRAPEX) 0.25 MG tablet Take 1 tablet (0.25 mg total) by mouth 3 (three) times daily.  . Probiotic Product (PROBIOTIC DAILY PO) Take 1 tablet by mouth daily.  . sitaGLIPtin (JANUVIA) 100 MG tablet Take 1 tablet (100 mg total) by mouth daily.  . valsartan-hydrochlorothiazide (DIOVAN-HCT) 320-25 MG tablet TAKE 1 TABLET BY MOUTH DAILY.  Marland Kitchen albuterol (PROVENTIL HFA;VENTOLIN HFA) 108 (90 BASE) MCG/ACT inhaler Take 1 to 2 inhalations 4 x daily or every 4 hours if need to rescue asthma   No current facility-administered medications on file prior to visit.  Allergies  Allergen Reactions  . Ceftriaxone Hives   PMHx:   Past Medical History:  Diagnosis Date  . Adult BMI 50.0-59.9 kg/sq m    52.77  . Anal fissure   . Anxiety   . Diabetes mellitus without complication (HCC)    borderline  . GERD (gastroesophageal reflux disease)    30 years ago, maybe from peptic ulcer  . H/O: gout    STABLE  . Hepatic steatosis 10/01/11  . History of kidney stones   . Hyperlipidemia   . Hypertension   . Kidney tumor    right upper kidney tumor, monitoring  . OSA on CPAP   . Parkinson disease ([place]) 01/04/2016  . Right ureteral stone   . Vitamin D deficiency   . Weakness    Immunization History   Administered Date(s) Administered  . Influenza-Unspecified 02/03/2013, 02/05/2014  . Pneumococcal-Unspecified 11/05/2012  . Tdap 03/26/2015   Past Surgical History:  Procedure Laterality Date  . ANAL FISSURE REPAIR  10-12-2000  . COLONOSCOPY N/A 07/01/2012   Procedure: COLONOSCOPY;  Surgeon: Todd Dragon, MD;  [facility];  Service: Endoscopy;  Laterality: N/A;  . CYSTOSCOPY WITH RETROGRADE PYELOGRAM, URETEROSCOPY AND STENT PLACEMENT Bilateral 02/26/2013   Procedure: CYSTOSCOPY WITH RETROGRADE PYELOGRAM, URETEROSCOPY AND STENT PLACEMENT;  Surgeon: Todd Frock, MD;  [facility];  Service: Urology;  Laterality: Bilateral;  . HOLMIUM LASER APPLICATION Bilateral 16/94/5038   Procedure: HOLMIUM LASER APPLICATION;  Surgeon: Todd Frock, MD;  [facility];  Service: Urology;  Laterality: Bilateral;  . kidney stone removal     06/2011-stent placement  . LEFT MEDIAL FEMORAL CONDYLE DEBRIDEMENT & DRILLING/ REMOVAL LOOSE BODY  09-15-2003  . NASAL SEPTUM SURGERY  1985  . STONE EXTRACTION WITH BASKET  06/12/2011   Procedure: STONE EXTRACTION WITH BASKET;  Surgeon: Todd Jabs, MD;  Location[facility];  Service: Urology;  Laterality: Right;   FHx:    Reviewed / unchanged  SHx:    Reviewed / unchanged  Systems Review:  Constitutional: Denies fever, chills, wt changes, headaches, insomnia, fatigue, night sweats, change in appetite. Eyes: Denies redness, blurred vision, diplopia, discharge, itchy, watery eyes.  ENT: Denies discharge, congestion, post nasal drip, epistaxis, sore throat, earache, hearing loss, dental pain, tinnitus, vertigo, sinus pain, snoring.  CV: Denies chest pain, palpitations, irregular heartbeat, syncope, dyspnea, diaphoresis, orthopnea, PND, claudication or edema. Respiratory: denies cough, dyspnea, DOE, pleurisy, hoarseness, laryngitis, wheezing.  Gastrointestinal: Denies dysphagia, odynophagia, heartburn, reflux, water brash,  abdominal pain or cramps, nausea, vomiting, bloating, diarrhea, constipation, hematemesis, melena, hematochezia  or hemorrhoids. Genitourinary: Denies dysuria, frequency, urgency, nocturia, hesitancy, discharge, hematuria or flank pain. Musculoskeletal: Denies arthralgias, myalgias, stiffness, jt. swelling, pain, limping or strain/sprain.  Skin: Denies pruritus, rash, hives, warts, acne, eczema or change in skin lesion(s). Neuro: No weakness, tremor, incoordination, spasms, paresthesia or pain. Psychiatric: Denies confusion, memory loss or sensory loss. Endo: Denies change in weight, skin or hair change.  Heme/Lymph: No excessive bleeding, bruising or enlarged lymph nodes.  Physical Exam  BP 128/76   Pulse 64   Temp 97.7 F (36.5 C)   Resp 16   Ht 5\' 7"  (1.702 m)   Wt (!) 302 lb 9.6 oz (137.3 kg)   BMI 47.39 kg/m   Appears over nourished and in no distress.  Eyes: PERRLA, EOMs, conjunctiva no swelling or erythema. Sinuses: No frontal/maxillary tenderness ENT/Mouth: EAC's clear, TM's nl w/o erythema, bulging. Nares clear w/o erythema, swelling, exudates. Oropharynx clear without erythema  or exudates. Oral hygiene is good. Tongue normal, non obstructing. Hearing intact.  Neck: Supple. Thyroid nl. Car 2+/2+ without bruits, nodes or JVD. Chest: Respirations nl with BS clear & equal w/o rales, rhonchi, wheezing or stridor.  Cor: Heart sounds normal w/ regular rate and rhythm without sig. murmurs, gallops, clicks, or rubs. Peripheral pulses normal and equal  without edema.  Abdomen: Soft & bowel sounds normal. Non-tender w/o guarding, rebound, hernias, masses, or organomegaly.  Lymphatics: Unremarkable.  Musculoskeletal: Full ROM all peripheral extremities, joint stability, 5/5 strength, and normal gait with decreased arm swing. No cog wheeling.  Skin: Warm, dry without exposed rashes, lesions or ecchymosis apparent.  Neuro: Cranial nerves intact, reflexes equal bilaterally. Sensory-motor  testing grossly intact. Tendon reflexes grossly intact. No tremor.  Pysch: Alert & oriented x 3.  Insight and judgement nl & appropriate. No ideations.  Assessment and Plan:  1. Essential hypertension  - Continue medication, monitor blood pressure at home.  - Continue DASH diet. Reminder to go to the ER if any CP,  SOB, nausea, dizziness, severe HA, changes vision/speech,  left arm numbness and tingling and jaw pain. - CBC with Differential/Platelet - BASIC METABOLIC PANEL WITH GFR - Magnesium - TSH  2. Mixed hyperlipidemia  - Continue diet/meds, exercise,& lifestyle modifications.  - Continue monitor periodic cholesterol/liver & renal functions  - Hepatic function panel - Lipid panel - TSH  3. Type 2 diabetes mellitus without complication, without long-term current use of insulin (HCC)  - Continue diet, exercise, lifestyle modifications.  - Monitor appropriate labs.  - most likely will need to restart Metformin pending labs.   - Hemoglobin A1c - Insulin, random  4. Vitamin D deficiency  - Continue supplementation. - VITAMIN D 25 Hydroxy   5. Gout  - Uric acid  6. Class 3 obesity due to excess calories with serious comorbidity and body mass index (BMI) of 45.0 to 49.9 in adult ([place])   7. Medication management  - CBC with Differential/Platelet - BASIC METABOLIC PANEL WITH GFR - Hepatic function panel - Magnesium - Lipid panel - TSH - Hemoglobin A1c - Insulin, random - VITAMIN D 25 Hydroxy - Uric acid       Recommended regular exercise, BP monitoring, weight control, and discussed med and SE's. Recommended labs to assess and monitor clinical status. Further disposition pending results of labs. Over 30 minutes of exam, counseling, chart review was performed

## 2016-07-14 ENCOUNTER — Other Ambulatory Visit: Payer: Self-pay | Admitting: Internal Medicine

## 2016-07-14 DIAGNOSIS — E119 Type 2 diabetes mellitus without complications: Secondary | ICD-10-CM

## 2016-07-14 LAB — HEPATIC FUNCTION PANEL
ALBUMIN: 4 g/dL (ref 3.6–5.1)
ALT: 16 U/L (ref 9–46)
AST: 23 U/L (ref 10–35)
Alkaline Phosphatase: 59 U/L (ref 40–115)
BILIRUBIN DIRECT: 0.1 mg/dL (ref <=0.2)
BILIRUBIN TOTAL: 0.5 mg/dL (ref 0.2–1.2)
Indirect Bilirubin: 0.4 mg/dL (ref 0.2–1.2)
Total Protein: 7.6 g/dL (ref 6.1–8.1)

## 2016-07-14 LAB — VITAMIN D 25 HYDROXY (VIT D DEFICIENCY, FRACTURES): VIT D 25 HYDROXY: 28 ng/mL — AB (ref 30–100)

## 2016-07-14 LAB — HEMOGLOBIN A1C
HEMOGLOBIN A1C: 8.5 % — AB (ref <5.7)
Mean Plasma Glucose: 197 mg/dL

## 2016-07-14 LAB — BASIC METABOLIC PANEL WITH GFR
BUN: 25 mg/dL (ref 7–25)
CALCIUM: 9.8 mg/dL (ref 8.6–10.3)
CO2: 25 mmol/L (ref 20–31)
CREATININE: 1.4 mg/dL — AB (ref 0.70–1.25)
Chloride: 99 mmol/L (ref 98–110)
GFR, Est African American: 62 mL/min (ref >=60)
GFR, Est Non African American: 53 mL/min — ABNORMAL LOW (ref >=60)
Glucose, Bld: 172 mg/dL — ABNORMAL HIGH (ref 65–99)
Potassium: 3.9 mmol/L (ref 3.5–5.3)
SODIUM: 137 mmol/L (ref 135–146)

## 2016-07-14 LAB — LIPID PANEL
CHOL/HDL RATIO: 5.8 ratio — AB (ref <5.0)
CHOLESTEROL: 185 mg/dL (ref <200)
HDL: 32 mg/dL — ABNORMAL LOW (ref >40)
LDL Cholesterol: 83 mg/dL (ref <100)
Triglycerides: 351 mg/dL — ABNORMAL HIGH (ref <150)
VLDL: 70 mg/dL — ABNORMAL HIGH (ref <30)

## 2016-07-14 LAB — MAGNESIUM: MAGNESIUM: 1.7 mg/dL (ref 1.5–2.5)

## 2016-07-14 LAB — URIC ACID: URIC ACID, SERUM: 4.6 mg/dL (ref 4.0–8.0)

## 2016-07-14 LAB — INSULIN, RANDOM: Insulin: 51.8 u[IU]/mL — ABNORMAL HIGH (ref 2.0–19.6)

## 2016-07-14 MED ORDER — METFORMIN HCL ER 500 MG PO TB24: ORAL_TABLET | ORAL | 1 refills | Status: DC

## 2016-07-14 MED FILL — METFORMIN HCL ER 500 MG TAB: 500 | 90 days supply | Qty: 360 | Fill #0

## 2016-07-17 ENCOUNTER — Other Ambulatory Visit: Payer: Self-pay | Admitting: Internal Medicine

## 2016-07-17 MED FILL — VENTOLIN HFA 90 MCG INHALER: 108 (90 BAS | 25 days supply | Qty: 18 | Fill #0

## 2016-07-18 DIAGNOSIS — G4733 Obstructive sleep apnea (adult) (pediatric): Secondary | ICD-10-CM | POA: Diagnosis not present

## 2016-07-19 ENCOUNTER — Ambulatory Visit: Payer: 59 | Admitting: Occupational Therapy

## 2016-07-26 ENCOUNTER — Ambulatory Visit: Payer: 59 | Admitting: Occupational Therapy

## 2016-07-26 DIAGNOSIS — R278 Other lack of coordination: Secondary | ICD-10-CM | POA: Diagnosis not present

## 2016-07-26 DIAGNOSIS — R29898 Other symptoms and signs involving the musculoskeletal system: Secondary | ICD-10-CM

## 2016-07-26 DIAGNOSIS — R29818 Other symptoms and signs involving the nervous system: Secondary | ICD-10-CM

## 2016-07-26 NOTE — Therapy (Signed)
[facility] [address] [place], [place], [address] Phone: [phone]   Fax:  [phone]  Occupational Therapy Treatment  Patient Details  Name: Todd Rice MRN: [id number] Date of Birth: 31-Oct-1955 Referring Provider: Dr. [name]  Encounter Date: 07/26/2016      OT End of Session - 07/26/16 1437    Visit Number 2   Number of Visits 6   Date for OT Re-Evaluation 09/01/16   Authorization Type [facility] [employer]   OT Start Time 0354   OT Stop Time 1402   OT Time Calculation (min) 45 min   Activity Tolerance Patient tolerated treatment well   Behavior During Therapy [facility] for tasks assessed/performed      Past Medical History:  Diagnosis Date  . Adult BMI 50.0-59.9 kg/sq m    52.77  . Anal fissure   . Anxiety   . Diabetes mellitus without complication (HCC)    borderline  . GERD (gastroesophageal reflux disease)    30 years ago, maybe from peptic ulcer  . H/O: gout    STABLE  . Hepatic steatosis 10/01/11  . History of kidney stones   . Hyperlipidemia   . Hypertension   . Kidney tumor    right upper kidney tumor, monitoring  . OSA on CPAP   . Parkinson disease ([place]) 01/04/2016  . Right ureteral stone   . Vitamin D deficiency   . Weakness     Past Surgical History:  Procedure Laterality Date  . ANAL FISSURE REPAIR  10-12-2000  . COLONOSCOPY N/A 07/01/2012   Procedure: COLONOSCOPY;  Surgeon: Lafayette Dragon, MD;  [facility];  Service: Endoscopy;  Laterality: N/A;  . CYSTOSCOPY WITH RETROGRADE PYELOGRAM, URETEROSCOPY AND STENT PLACEMENT Bilateral 02/26/2013   Procedure: CYSTOSCOPY WITH RETROGRADE PYELOGRAM, URETEROSCOPY AND STENT PLACEMENT;  Surgeon: Alexis Frock, MD;  [facility];  Service: Urology;  Laterality: Bilateral;  . HOLMIUM LASER APPLICATION Bilateral 65/68/1275   Procedure: HOLMIUM LASER APPLICATION;  Surgeon: Alexis Frock, MD;  [facility];  Service: Urology;  Laterality:  Bilateral;  . kidney stone removal     06/2011-stent placement  . LEFT MEDIAL FEMORAL CONDYLE DEBRIDEMENT & DRILLING/ REMOVAL LOOSE BODY  09-15-2003  . NASAL SEPTUM SURGERY  1985  . STONE EXTRACTION WITH BASKET  06/12/2011   Procedure: STONE EXTRACTION WITH BASKET;  Surgeon: Claybon Jabs, MD;  [facility];  Service: Urology;  Laterality: Right;    There were no vitals filed for this visit.      Subjective Assessment - 07/26/16 1435    Subjective  Pt reports his work activities are going better   Patient Stated Goals improve function in right hand   Currently in Pain? No/denies             Treatment: PWR ! Hands( series including PWR! up, rock, twist and step) 5-10 reps each, min v.c Gentle passive stretch of right forearm in supination followed by flipping, dealing and flicking playing cards with bilateral UE's, min v.c. Placing grooved pegs in pegboard then removing with PWR! step, min v.c. Discussed adapted strategy for fastening seatbelt. Simulated tying and releasing a tournaquet with string, pt to bring in a tournaquet next visit.                   OT Short Term Goals - 07/05/16 1522      OT SHORT TERM GOAL #4   Status --  45 blocks  OT SHORT TERM GOAL #5   Status --  RUE 10.5 inches, LUE 10 inch           OT Long Term Goals - 07/05/16 1347      OT LONG TERM GOAL #1   Title Pt will demonstrate understanding of PD specific HEP following review.   Time 8   Period Weeks   Status New     OT LONG TERM GOAL #2   Title Pt will verbalize understanding of updated adapted strategies for ADLs/IADLs, and work activities.   Time 8   Period Weeks   Status New     OT LONG TERM GOAL #3   Title Pt will demonstrate ability to print a short paragarph with 100%    Time 8   Period Weeks   Status New     OT LONG TERM GOAL #4   Title Pt will improve 3 button/ unbutton to 28 secs or less   Time 8   Period Weeks   Status  New               Plan - 07/26/16 1436    Clinical Impression Statement Pt is progressing towards goals. He demonstrates good understanding of strategies for ADLS/ IADLs.   Rehab Potential Good   OT Frequency --  6 visits    OT Duration 8 weeks   OT Treatment/Interventions Self-care/ADL training;Moist Heat;Fluidtherapy;DME and/or AE instruction;Splinting;Patient/family education;Balance training;Therapeutic exercises;Gait Training;Contrast Bath;Ultrasound;Therapeutic exercise;Therapeutic activities;Cognitive remediation/compensation;Passive range of motion;Functional Mobility Training;Neuromuscular education;Cryotherapy;Electrical Stimulation;Parrafin;Energy conservation;Manual Therapy;Visual/perceptual remediation/compensation   Plan check goals, continue to address work activities, anticipate d/c next visit   Consulted and Agree with Plan of Care Patient      Patient will benefit from skilled therapeutic intervention in order to improve the following deficits and impairments:     Visit Diagnosis: Other symptoms and signs involving the nervous system  Other lack of coordination  Other symptoms and signs involving the musculoskeletal system    Problem List Patient Active Problem List   Diagnosis Date Noted  . Parkinson disease ([place]) 01/04/2016  . Seasonal and perennial allergic rhinitis 06/21/2014  . Asthma, mild intermittent, well-controlled 06/21/2014  . Pseudomonas aeruginosa colonization 10/08/2012  . Hemorrhage of rectum and anus 07/01/2012  . CAD (coronary artery disease) 11/15/2011  . Hypertension 11/15/2011  . Hyperlipidemia 11/15/2011  . Obesity 11/15/2011  . Ureteral calculus, right 06/12/2011    RINE,KATHRYN 07/26/2016, 2:38 PM  [facility] [address] [place], [place], [address] Phone: [phone]   Fax:  [phone]  Name: VONDELL BABERS MRN: [id number] Date of Birth: 11-25-1955

## 2016-07-30 MED FILL — CARBIDOPA-LEVODOPA 25-100 T: 25-100 | 30 days supply | Qty: 45 | Fill #2

## 2016-07-31 MED FILL — ALLOPURINOL 300 MG TAB: 300 | 90 days supply | Qty: 90 | Fill #1

## 2016-08-07 DIAGNOSIS — C641 Malignant neoplasm of right kidney, except renal pelvis: Secondary | ICD-10-CM | POA: Diagnosis not present

## 2016-08-09 ENCOUNTER — Ambulatory Visit: Payer: 59 | Attending: Neurology | Admitting: Occupational Therapy

## 2016-08-09 DIAGNOSIS — R293 Abnormal posture: Secondary | ICD-10-CM | POA: Insufficient documentation

## 2016-08-09 DIAGNOSIS — R278 Other lack of coordination: Secondary | ICD-10-CM | POA: Diagnosis not present

## 2016-08-09 DIAGNOSIS — R29898 Other symptoms and signs involving the musculoskeletal system: Secondary | ICD-10-CM | POA: Diagnosis not present

## 2016-08-09 DIAGNOSIS — R29818 Other symptoms and signs involving the nervous system: Secondary | ICD-10-CM | POA: Insufficient documentation

## 2016-08-10 NOTE — Therapy (Signed)
[facility] [address] [place], [place], [address] Phone: [phone]   Fax:  [phone]  Occupational Therapy Treatment  Patient Details  Name: Todd Rice MRN: [id number] Date of Birth: January 23, 1956 Referring Provider: Dr. [name]  Encounter Date: 08/09/2016      OT End of Session - 08/10/16 1255    Visit Number 3   Number of Visits 6   Date for OT Re-Evaluation 09/01/16   Authorization Type [employer]   OT Start Time 1450   OT Stop Time 1534   OT Time Calculation (min) 44 min   Activity Tolerance Patient tolerated treatment well   Behavior During Therapy [facility] for tasks assessed/performed      Past Medical History:  Diagnosis Date  . Adult BMI 50.0-59.9 kg/sq m    52.77  . Anal fissure   . Anxiety   . Diabetes mellitus without complication (HCC)    borderline  . GERD (gastroesophageal reflux disease)    30 years ago, maybe from peptic ulcer  . H/O: gout    STABLE  . Hepatic steatosis 10/01/11  . History of kidney stones   . Hyperlipidemia   . Hypertension   . Kidney tumor    right upper kidney tumor, monitoring  . OSA on CPAP   . Parkinson disease ([place]) 01/04/2016  . Right ureteral stone   . Vitamin D deficiency   . Weakness     Past Surgical History:  Procedure Laterality Date  . ANAL FISSURE REPAIR  10-12-2000  . COLONOSCOPY N/A 07/01/2012   Procedure: COLONOSCOPY;  Surgeon: Lafayette Dragon, MD;  [facility];  Service: Endoscopy;  Laterality: N/A;  . CYSTOSCOPY WITH RETROGRADE PYELOGRAM, URETEROSCOPY AND STENT PLACEMENT Bilateral 02/26/2013   Procedure: CYSTOSCOPY WITH RETROGRADE PYELOGRAM, URETEROSCOPY AND STENT PLACEMENT;  Surgeon: Alexis Frock, MD;  [facility];  Service: Urology;  Laterality: Bilateral;  . HOLMIUM LASER APPLICATION Bilateral 29/93/7169   Procedure: HOLMIUM LASER APPLICATION;  Surgeon: Alexis Frock, MD;  Location: [facility];  Service: Urology;  Laterality:  Bilateral;  . kidney stone removal     06/2011-stent placement  . LEFT MEDIAL FEMORAL CONDYLE DEBRIDEMENT & DRILLING/ REMOVAL LOOSE BODY  09-15-2003  . NASAL SEPTUM SURGERY  1985  . STONE EXTRACTION WITH BASKET  06/12/2011   Procedure: STONE EXTRACTION WITH BASKET;  Surgeon: Claybon Jabs, MD;  Location: [facility];  Service: Urology;  Laterality: Right;    There were no vitals filed for this visit.      Subjective Assessment - 08/09/16 1453    Subjective  Allergies are bothering him   Patient is accompained by: Family member   Patient Stated Goals improve function in right hand   Currently in Pain? No/denies           Treatment: PWR! Hands(PWR! Twist, PWR!up and PWR! Rock ) x5-10 reps each min v.c. For larger amplitude movements. Pt simulated tying and releasing a tourniquet with good accuracy. Reviewed coordination activities from HEP: flipping, dealing and flicking cards, manipulating balls and pennies, min v.c Therapist checked progress towards goals and discussed PD screen in 6 mons. Pt is interested in the PWR! exercise class, note left for group leader regarding pt's interest.                         OT Long Term Goals - 08/09/16 1455      OT LONG TERM GOAL #1  Title Pt will demonstrate understanding of PD specific HEP following review.   Time 8   Period Weeks   Status Achieved     OT LONG TERM GOAL #2   Title Pt will verbalize understanding of updated adapted strategies for ADLs/IADLs, and work activities.   Time 8   Period Weeks   Status Achieved     OT LONG TERM GOAL #3   Title Pt will demonstrate ability to print a short paragraph with 100% legibility   Time 8   Period Weeks   Status Achieved  only min decrease in letter size     OT LONG TERM GOAL #4   Title Pt will improve 3 button/ unbutton to 28 secs or less   Time 8   Period Weeks   Status Not Met  28.13 secs, 28.25 secs- not fully met yet improved     OT  LONG TERM GOAL #5   Title Pt will report UE pain no greater than 3/10 for ADLs/IADLs.   Time 8   Period Weeks   Status Achieved               Plan - 08/10/16 1252    Clinical Impression Statement Pt demonstrates good overall progress towards goals. He agrees with plans for d/c today.   Rehab Potential Good   OT Frequency --  6 visits   OT Duration 8 weeks   OT Treatment/Interventions Self-care/ADL training;Moist Heat;Fluidtherapy;DME and/or AE instruction;Splinting;Patient/family education;Balance training;Therapeutic exercises;Gait Training;Contrast Bath;Ultrasound;Therapeutic exercise;Therapeutic activities;Cognitive remediation/compensation;Passive range of motion;Functional Mobility Training;Neuromuscular education;Cryotherapy;Electrical Stimulation;Parrafin;Energy conservation;Manual Therapy;Visual/perceptual remediation/compensation   Plan d/c OT, pt can benefit from a PD screen in 6 mons due to the progressive nature of his diagnosis   OT Home Exercise Plan issued PWR! seated quadraped and coordination HEP, ways to prevent future PD related complications, big movements with ADLs handout   Consulted and Agree with Plan of Care Patient      Patient will benefit from skilled therapeutic intervention in order to improve the following deficits and impairments:  Abnormal gait, Decreased range of motion, Difficulty walking, Impaired flexibility, Decreased safety awareness, Decreased endurance, Decreased activity tolerance, Decreased knowledge of precautions, Impaired tone, Impaired UE functional use, Pain, Decreased balance, Decreased cognition, Decreased mobility, Decreased strength  Visit Diagnosis: Other symptoms and signs involving the nervous system  Other lack of coordination  Other symptoms and signs involving the musculoskeletal system  Abnormal posture   OCCUPATIONAL THERAPY DISCHARGE SUMMARY    Current functional level related to goals / functional outcomes: See  above, pt made excellent overall progress. He appears to be responding well to sinemet.   Remaining deficits: Bradykinesia, rigidity, decreased balance, decreased coordination   Education / Equipment: Pt was educated regarding:adapted strategies for ADLs/IADLs, HEP, and  community resources. Pt verbalizes understaning of all education. He can benefit from a multi disciplinary screen in 6 mons. Plan: Patient agrees to discharge.  Patient goals were partially met. Patient is being discharged due to being pleased with the current functional level.  ?????     Problem List Patient Active Problem List   Diagnosis Date Noted  . Parkinson disease ([place]) 01/04/2016  . Seasonal and perennial allergic rhinitis 06/21/2014  . Asthma, mild intermittent, well-controlled 06/21/2014  . Pseudomonas aeruginosa colonization 10/08/2012  . Hemorrhage of rectum and anus 07/01/2012  . CAD (coronary artery disease) 11/15/2011  . Hypertension 11/15/2011  . Hyperlipidemia 11/15/2011  . Obesity 11/15/2011  . Ureteral calculus, right 06/12/2011    RINE,KATHRYN 08/10/2016, 12:56  PM Theone Murdoch, OTR/L [phone] Phone: [phone] 1:02 PM 08/10/16 [facility] [address] [place], [place], [address] Phone: [phone]   Fax:  [phone]  Name: Todd Rice MRN: [id number] Date of Birth: 1956-01-06

## 2016-08-11 DIAGNOSIS — D49511 Neoplasm of unspecified behavior of right kidney: Secondary | ICD-10-CM | POA: Diagnosis not present

## 2016-08-11 DIAGNOSIS — N2889 Other specified disorders of kidney and ureter: Secondary | ICD-10-CM | POA: Diagnosis not present

## 2016-08-14 DIAGNOSIS — N3020 Other chronic cystitis without hematuria: Secondary | ICD-10-CM | POA: Diagnosis not present

## 2016-08-14 DIAGNOSIS — R972 Elevated prostate specific antigen [PSA]: Secondary | ICD-10-CM | POA: Diagnosis not present

## 2016-08-14 DIAGNOSIS — C651 Malignant neoplasm of right renal pelvis: Secondary | ICD-10-CM | POA: Diagnosis not present

## 2016-08-14 DIAGNOSIS — N201 Calculus of ureter: Secondary | ICD-10-CM | POA: Diagnosis not present

## 2016-08-14 LAB — POC URINALSYSI DIPSTICK (AUTOMATED)
Bilirubin, UA: NEGATIVE
Blood, UA: NEGATIVE
Glucose, UA: POSITIVE — AB
Ketones, UA: NEGATIVE
Leukocytes, UA: NEGATIVE
Nitrite, UA: NEGATIVE
Protein, UA: NEGATIVE
Spec Grav, UA: 1.015 (ref 1.010–1.025)
Urobilinogen, UA: 0.2 E.U./dL
pH, UA: 7 (ref 5.0–8.0)

## 2016-08-21 MED FILL — JANUVIA 100 MG TABLET: 100 | 90 days supply | Qty: 90 | Fill #1

## 2016-09-04 DIAGNOSIS — H5203 Hypermetropia, bilateral: Secondary | ICD-10-CM | POA: Diagnosis not present

## 2016-09-04 DIAGNOSIS — H524 Presbyopia: Secondary | ICD-10-CM | POA: Diagnosis not present

## 2016-09-04 DIAGNOSIS — E119 Type 2 diabetes mellitus without complications: Secondary | ICD-10-CM | POA: Diagnosis not present

## 2016-09-04 DIAGNOSIS — H2513 Age-related nuclear cataract, bilateral: Secondary | ICD-10-CM | POA: Diagnosis not present

## 2016-09-04 LAB — HM DIABETES EYE EXAM

## 2016-09-04 MED FILL — CARBIDOPA-LEVODOPA 25-100 T: 25-100 | 10 days supply | Qty: 15 | Fill #3

## 2016-09-14 ENCOUNTER — Other Ambulatory Visit: Payer: Self-pay | Admitting: Internal Medicine

## 2016-09-14 MED FILL — ALPRAZolam 0.5 MG TABS: 0.5 | 30 days supply | Qty: 90 | Fill #0

## 2016-09-14 NOTE — Telephone Encounter (Signed)
Please call Alpraz  

## 2016-09-15 MED FILL — VALSARTAN-HCTZ 320-25 MG TA: 320-25 | 90 days supply | Qty: 90 | Fill #0

## 2016-09-19 ENCOUNTER — Telehealth: Payer: Self-pay | Admitting: Neurology

## 2016-09-19 NOTE — Telephone Encounter (Signed)
Patients wife Dawn called office requesting refill for carbidopa-levodopa (SINEMET IR) 25-100 MG tablet.  Pharmacy- [facility].

## 2016-09-20 MED ORDER — CARBIDOPA-LEVODOPA 25-100 MG PO TABS: 0.5000 | ORAL_TABLET | Freq: Three times a day (TID) | ORAL | 11 refills | Status: DC

## 2016-09-20 MED FILL — CARBIDOPA-LEVODOPA 25-100 T: 25-100 | 30 days supply | Qty: 45 | Fill #0

## 2016-09-20 NOTE — Addendum Note (Signed)
Addended by: Laurence Spates on: 09/20/2016 10:41 AM   Modules accepted: Orders

## 2016-09-20 NOTE — Telephone Encounter (Signed)
Refills sent in

## 2016-09-28 ENCOUNTER — Ambulatory Visit (INDEPENDENT_AMBULATORY_CARE_PROVIDER_SITE_OTHER): Payer: 59 | Admitting: Neurology

## 2016-09-28 ENCOUNTER — Encounter: Payer: Self-pay | Admitting: Neurology

## 2016-09-28 VITALS — BP 142/80 | HR 76 | Resp 22 | Ht 67.0 in | Wt 298.0 lb

## 2016-09-28 DIAGNOSIS — G20 Parkinson's disease: Secondary | ICD-10-CM

## 2016-09-28 MED ORDER — CARBIDOPA-LEVODOPA 25-100 MG PO TABS: 1.0000 | ORAL_TABLET | Freq: Three times a day (TID) | ORAL | 2 refills | Status: DC

## 2016-09-28 NOTE — Progress Notes (Signed)
Reason for visit: Parkinson's disease  Todd Rice is an 61 y.o. male  History of present illness:  Todd Rice is a 61 year old right-handed white male with a history of Parkinson's disease. He indicates that his right hand function has continued to worsen, he is developing some tremors at this point. He has clumsiness of the hand and cannot perform full functions at times. The patient denies any balance issues or falls. He denies any problems with swallowing or choking or drooling. The patient is planning on retiring on 10/13/2016. He is having increasing problems functioning at work. The patient returns for an evaluation. He is on low-dose Sinemet taking one half of a 25/100 mg tablet 3 times daily, he is on low-dose Mirapex taking 0.25 mg 3 times daily.  Past Medical History:  Diagnosis Date  . Adult BMI 50.0-59.9 kg/sq m    52.77  . Anal fissure   . Anxiety   . Diabetes mellitus without complication (HCC)    borderline  . GERD (gastroesophageal reflux disease)    30 years ago, maybe from peptic ulcer  . H/O: gout    STABLE  . Hepatic steatosis 10/01/11  . History of kidney stones   . Hyperlipidemia   . Hypertension   . Kidney tumor    right upper kidney tumor, monitoring  . OSA on CPAP   . Parkinson disease ([place]) 01/04/2016  . Right ureteral stone   . Vitamin D deficiency   . Weakness     Past Surgical History:  Procedure Laterality Date  . ANAL FISSURE REPAIR  10-12-2000  . COLONOSCOPY N/A 07/01/2012   Procedure: COLONOSCOPY;  Surgeon: Lafayette Dragon, MD;  [facility];  Service: Endoscopy;  Laterality: N/A;  . CYSTOSCOPY WITH RETROGRADE PYELOGRAM, URETEROSCOPY AND STENT PLACEMENT Bilateral 02/26/2013   Procedure: CYSTOSCOPY WITH RETROGRADE PYELOGRAM, URETEROSCOPY AND STENT PLACEMENT;  Surgeon: Alexis Frock, MD;  [facility];  Service: Urology;  Laterality: Bilateral;  . HOLMIUM LASER APPLICATION Bilateral 25/09/3974   Procedure: HOLMIUM LASER  APPLICATION;  Surgeon: Alexis Frock, MD;  [facility];  Service: Urology;  Laterality: Bilateral;  . kidney stone removal     06/2011-stent placement  . LEFT MEDIAL FEMORAL CONDYLE DEBRIDEMENT & DRILLING/ REMOVAL LOOSE BODY  09-15-2003  . NASAL SEPTUM SURGERY  1985  . STONE EXTRACTION WITH BASKET  06/12/2011   Procedure: STONE EXTRACTION WITH BASKET;  Surgeon: Claybon Jabs, MD;  Location: [facility];  Service: Urology;  Laterality: Right;    Family History  Problem Relation Age of Onset  . Heart disease Mother        Atrial fibrillation  . Hypertension Mother   . Hyperlipidemia Mother   . Lung cancer Father        was a smoker  . Cancer Father        lung  . Hyperlipidemia Brother   . Heart disease Maternal Aunt   . Hyperlipidemia Maternal Aunt   . Hypertension Maternal Aunt   . Stroke Maternal Aunt   . Heart disease Paternal Grandmother   . Hyperlipidemia Paternal Grandmother     Social history:  reports that he has never smoked. He has never used smokeless tobacco. He reports that he does not drink alcohol or use drugs.    Allergies  Allergen Reactions  . Ceftriaxone Hives    Medications:  Prior to Admission medications   Medication Sig Start Date End Date Taking? Authorizing Provider  allopurinol (ZYLOPRIM)  300 MG tablet TAKE 1 TABLET BY MOUTH DAILY. 12/20/15  Yes Unk Pinto, MD  ALPRAZolam Duanne Moron) 0.5 MG tablet TAKE 1 TABLET BY MOUTH THREE TIMES DAILY 09/14/16  Yes Unk Pinto, MD  aspirin EC 81 MG tablet Take 81 mg by mouth at bedtime.   Yes [provider]  carbidopa-levodopa (SINEMET IR) 25-100 MG tablet Take 0.5 tablets by mouth 3 (three) times daily. 09/20/16  Yes Kathrynn Ducking, MD  COLCRYS 0.6 MG tablet Take 1 tablet (0.6 mg total) by mouth 2 (two) times daily. 12/11/14  Yes Forcucci, Courtney, PA-C  CRESTOR 10 MG tablet Take 1 tablet (10 mg total) by mouth at bedtime. 03/01/15  Yes Unk Pinto, MD  EPINEPHrine  (EPIPEN 2-PAK) 0.3 mg/0.3 mL IJ SOAJ injection Inject 0.3 mLs (0.3 mg total) into the muscle once. 03/27/14  Yes Kelby Aline, PA-C  metFORMIN (GLUCOPHAGE XR) 500 MG 24 hr tablet Take 2 tablets 2 x / day for Diabetes 07/14/16 01/14/17 Yes Unk Pinto, MD  pramipexole (MIRAPEX) 0.25 MG tablet Take 1 tablet (0.25 mg total) by mouth 3 (three) times daily. 07/06/16  Yes Kathrynn Ducking, MD  Probiotic Product (PROBIOTIC DAILY PO) Take 1 tablet by mouth daily.   Yes [provider]  sitaGLIPtin (JANUVIA) 100 MG tablet Take 1 tablet (100 mg total) by mouth daily. 05/15/16  Yes Forcucci, Courtney, PA-C  valsartan-hydrochlorothiazide (DIOVAN-HCT) 320-25 MG tablet TAKE 1 TABLET BY MOUTH ONCE DAILY 09/14/16  Yes Unk Pinto, MD  VENTOLIN HFA 108 (90 Base) MCG/ACT inhaler INHALE 2 PUFFS INTO THE LUNGS EVERY 6 HOURS AS NEEDED FOR WHEEZING OR SHORTNESS OF BREATH. 07/17/16  Yes Unk Pinto, MD  albuterol (PROVENTIL HFA;VENTOLIN HFA) 108 (90 BASE) MCG/ACT inhaler Take 1 to 2 inhalations 4 x daily or every 4 hours if need to rescue asthma 12/15/14 04/10/16  Unk Pinto, MD    ROS:  Out of a complete 14 system review of symptoms, the patient complains only of the following symptoms, and all other reviewed systems are negative.  Tremor  Blood pressure (!) 142/80, pulse 76, resp. rate (!) 22, height 5\' 7"  (1.702 m), weight 298 lb (135.2 kg).  Physical Exam  General: The patient is alert and cooperative at the time of the examination. The patient is morbidly obese.  Skin: 2+ edema below the knees is noted bilaterally.   Neurologic Exam  Mental status: The patient is alert and oriented x 3 at the time of the examination. The patient has apparent normal recent and remote memory, with an apparently normal attention span and concentration ability.   Cranial nerves: Facial symmetry is present. Speech is normal, no aphasia or dysarthria is noted. Extraocular movements are full. Visual fields  are full. Masking of the face is seen. The patient has lid lag when looking down.  Motor: The patient has good strength in all 4 extremities.  Sensory examination: Soft touch sensation is symmetric on the face, arms, and legs.  Coordination: The patient has good finger-nose-finger and heel-to-shin bilaterally.  Gait and station: The patient has a normal gait, but arm swing is decreased bilaterally, right greater than left. Tandem gait is normal. Romberg is negative. No drift is seen.  Reflexes: Deep tendon reflexes are symmetric.   Assessment/Plan:  1. Parkinson's disease  The patient is having increasing problems with clumsiness of the right hand. The Sinemet will be increased taking the 25/100 mg tablet 3 times daily. We will see him back in the beginning of August 2018, we  may consider increasing the Mirapex at that time.  Jill Alexanders MD 09/28/2016 8:21 AM  [facility] [address] [place], [place] [address]  Phone [phone] Fax [phone]

## 2016-09-28 NOTE — Patient Instructions (Signed)
   We will increase the Sinemet 25/100 mg tablet to take one full tablet three times a day.  Sinemet (carbidopa) may result in confusion or hallucinations, drowsiness, nausea, or dizziness. If any significant side effects are noted, please contact our office. Sinemet may not be well absorbed when taken with high protein meals, if tolerated it is best to take 30-45 minutes before you eat.

## 2016-10-01 ENCOUNTER — Ambulatory Visit (HOSPITAL_COMMUNITY)
Admission: EM | Admit: 2016-10-01 | Discharge: 2016-10-01 | Disposition: A | Discharge status: Home / Self Care | Payer: 59 | Attending: Family Medicine | Admitting: Family Medicine

## 2016-10-01 ENCOUNTER — Encounter (HOSPITAL_COMMUNITY): Payer: Self-pay | Admitting: Emergency Medicine

## 2016-10-01 DIAGNOSIS — M545 Low back pain, unspecified: Secondary | ICD-10-CM

## 2016-10-01 MED ORDER — HYDROCODONE-ACETAMINOPHEN 5-325 MG PO TABS: 1.0000 | ORAL_TABLET | Freq: Four times a day (QID) | ORAL | 0 refills | Status: DC | PRN

## 2016-10-01 MED ORDER — METHOCARBAMOL 500 MG PO TABS: 500.0000 mg | ORAL_TABLET | Freq: Three times a day (TID) | ORAL | 0 refills | Status: DC | PRN

## 2016-10-01 MED ORDER — PREDNISONE 20 MG PO TABS: 60.0000 mg | ORAL_TABLET | Freq: Every day | ORAL | 0 refills | Status: DC

## 2016-10-01 NOTE — ED Triage Notes (Signed)
Pt c/o back pain onset 4 days... Pain increases w/activity  Believes he may have twisted and turned in an awkward movement  Has been applying heating pads and using BioFreeze w/no relief.   A&O x4.... NAD... Ambulatory

## 2016-10-01 NOTE — ED Provider Notes (Signed)
CSN: [id number]     Arrival date & time 10/01/16  1328 History   None    Chief Complaint  Patient presents with  . Back Pain   (Consider location/radiation/quality/duration/timing/severity/associated sxs/prior Treatment)  HPI   The patient is a 61 year old male presenting today with lower back pain. 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 Patient states he has no discomfort when he is standing or laying down flat but that he is uncomfortable with sitting or any movements. Reports history of hypertension and diabetes for which she is taking his medications appropriately. Denies numbness, tingling or radiation of discomfort.  Past Medical History:  Diagnosis Date  . Adult BMI 50.0-59.9 kg/sq m    52.77  . Anal fissure   . Anxiety   . Diabetes mellitus without complication (HCC)    borderline  . GERD (gastroesophageal reflux disease)    30 years ago, maybe from peptic ulcer  . H/O: gout    STABLE  . Hepatic steatosis 10/01/11  . History of kidney stones   . Hyperlipidemia   . Hypertension   . Kidney tumor    right upper kidney tumor, monitoring  . OSA on CPAP   . Parkinson disease ([place]) 01/04/2016  . Right ureteral stone   . Vitamin D deficiency   . Weakness    Past Surgical History:  Procedure Laterality Date  . ANAL FISSURE REPAIR  10-12-2000  . COLONOSCOPY N/A 07/01/2012   Procedure: COLONOSCOPY;  Surgeon: Lafayette Dragon, MD;  [facility];  Service: Endoscopy;  Laterality: N/A;  . CYSTOSCOPY WITH RETROGRADE PYELOGRAM, URETEROSCOPY AND STENT PLACEMENT Bilateral 02/26/2013   Procedure: CYSTOSCOPY WITH RETROGRADE PYELOGRAM, URETEROSCOPY AND STENT PLACEMENT;  Surgeon: Alexis Frock, MD;  [facility];  Service: Urology;  Laterality: Bilateral;  . HOLMIUM LASER APPLICATION Bilateral 36/14/4315   Procedure: HOLMIUM LASER APPLICATION;   Surgeon: Alexis Frock, MD;  [facility];  Service: Urology;  Laterality: Bilateral;  . kidney stone removal     06/2011-stent placement  . LEFT MEDIAL FEMORAL CONDYLE DEBRIDEMENT & DRILLING/ REMOVAL LOOSE BODY  09-15-2003  . NASAL SEPTUM SURGERY  1985  . STONE EXTRACTION WITH BASKET  06/12/2011   Procedure: STONE EXTRACTION WITH BASKET;  Surgeon: Claybon Jabs, MD;  Location: [facility];  Service: Urology;  Laterality: Right;   Family History  Problem Relation Age of Onset  . Heart disease Mother        Atrial fibrillation  . Hypertension Mother   . Hyperlipidemia Mother   . Lung cancer Father        was a smoker  . Cancer Father        lung  . Hyperlipidemia Brother   . Heart disease Maternal Aunt   . Hyperlipidemia Maternal Aunt   . Hypertension Maternal Aunt   . Stroke Maternal Aunt   . Heart disease Paternal Grandmother   . Hyperlipidemia Paternal Grandmother    Social History  Substance Use Topics  . Smoking status: Never Smoker  . Smokeless tobacco: Never Used  . Alcohol use No    Review of Systems  Allergies  Ceftriaxone  Home Medications   Prior to Admission medications   Medication Sig Start Date End Date Taking? Authorizing Provider  allopurinol (ZYLOPRIM) 300 MG tablet TAKE 1 TABLET BY MOUTH DAILY. 12/20/15  Yes Unk Pinto,  MD  aspirin EC 81 MG tablet Take 81 mg by mouth at bedtime.   Yes [provider]  carbidopa-levodopa (SINEMET IR) 25-100 MG tablet Take 1 tablet by mouth 3 (three) times daily. 09/28/16  Yes Kathrynn Ducking, MD  CRESTOR 10 MG tablet Take 1 tablet (10 mg total) by mouth at bedtime. 03/01/15  Yes Unk Pinto, MD  metFORMIN (GLUCOPHAGE XR) 500 MG 24 hr tablet Take 2 tablets 2 x / day for Diabetes 07/14/16 01/14/17 Yes Unk Pinto, MD  pramipexole (MIRAPEX) 0.25 MG tablet Take 1 tablet (0.25 mg total) by mouth 3 (three) times daily. 07/06/16  Yes Kathrynn Ducking, MD  sitaGLIPtin (JANUVIA) 100 MG  tablet Take 1 tablet (100 mg total) by mouth daily. 05/15/16  Yes Forcucci, Courtney, PA-C  valsartan-hydrochlorothiazide (DIOVAN-HCT) 320-25 MG tablet TAKE 1 TABLET BY MOUTH ONCE DAILY 09/14/16  Yes Unk Pinto, MD  albuterol (PROVENTIL HFA;VENTOLIN HFA) 108 (90 BASE) MCG/ACT inhaler Take 1 to 2 inhalations 4 x daily or every 4 hours if need to rescue asthma 12/15/14 04/10/16  Unk Pinto, MD  ALPRAZolam Duanne Moron) 0.5 MG tablet TAKE 1 TABLET BY MOUTH THREE TIMES DAILY 09/14/16   Unk Pinto, MD  COLCRYS 0.6 MG tablet Take 1 tablet (0.6 mg total) by mouth 2 (two) times daily. 12/11/14   Forcucci, Courtney, PA-C  EPINEPHrine (EPIPEN 2-PAK) 0.3 mg/0.3 mL IJ SOAJ injection Inject 0.3 mLs (0.3 mg total) into the muscle once. 03/27/14   Kelby Aline, PA-C  HYDROcodone-acetaminophen (NORCO/VICODIN) 5-325 MG tablet Take 1-2 tablets by mouth every 6 (six) hours as needed. 10/01/16   Nehemiah Settle, NP  methocarbamol (ROBAXIN) 500 MG tablet Take 1 tablet (500 mg total) by mouth every 8 (eight) hours as needed for muscle spasms. 10/01/16   Nehemiah Settle, NP  predniSONE (DELTASONE) 20 MG tablet Take 3 tablets (60 mg total) by mouth daily. 10/01/16   Nehemiah Settle, NP  Probiotic Product (PROBIOTIC DAILY PO) Take 1 tablet by mouth daily.    [provider]  VENTOLIN HFA 108 (90 Base) MCG/ACT inhaler INHALE 2 PUFFS INTO THE LUNGS EVERY 6 HOURS AS NEEDED FOR WHEEZING OR SHORTNESS OF BREATH. 07/17/16   Unk Pinto, MD   Meds Ordered and Administered this Visit  Medications - No data to display  BP (!) 146/78 (BP Location: Right Arm)   Pulse 75   Temp 98.5 F (36.9 C) (Oral)   Resp 18   SpO2 96%  No data found.   Physical Exam  Constitutional: He is oriented to person, place, and time. He appears well-developed and well-nourished. No distress.  Cardiovascular: Normal rate, regular rhythm, normal heart sounds and intact distal pulses.  Exam reveals no gallop and no friction rub.    No murmur heard. 2+ pedis dorsalis pulses present.  Pulmonary/Chest: Effort normal and breath sounds normal. No respiratory distress. He has no wheezes. He has no rales. He exhibits no tenderness.  Musculoskeletal: Normal range of motion. He exhibits tenderness. He exhibits no edema or deformity.       Lumbar back: He exhibits tenderness. He exhibits normal range of motion, no bony tenderness, no swelling, no edema, no deformity, no laceration, no pain, no spasm and normal pulse.       Back:  Patient is able to climb up and down from examination table by himself with some noticeable discomfort. Strength 5/5.   Neurological: He is alert and oriented to person, place, and time. He has normal strength. He  displays no atrophy and normal reflexes. No sensory deficit. He exhibits normal muscle tone. Coordination and gait normal.  Reflex Scores:      Patellar reflexes are 2+ on the right side and 2+ on the left side. Skin: Skin is warm and dry. He is not diaphoretic.  Nursing note and vitals reviewed.   Urgent Care Course     Procedures (including critical care time)  Labs Review Labs Reviewed - No data to display  Imaging Review No results found.    MDM   1. Acute bilateral low back pain without sciatica    Meds ordered this encounter  Medications  . predniSONE (DELTASONE) 20 MG tablet    Sig: Take 3 tablets (60 mg total) by mouth daily.    Dispense:  15 tablet    Refill:  0  . HYDROcodone-acetaminophen (NORCO/VICODIN) 5-325 MG tablet    Sig: Take 1-2 tablets by mouth every 6 (six) hours as needed.    Dispense:  10 tablet    Refill:  0  . methocarbamol (ROBAXIN) 500 MG tablet    Sig: Take 1 tablet (500 mg total) by mouth every 8 (eight) hours as needed for muscle spasms.    Dispense:  24 tablet    Refill:  0   The [place] Controlled Substance Reporting System was checked before prescribing. The usual and customary discharge instructions and warnings were given.   The patient verbalizes understanding and agrees to plan of care.        Nehemiah Settle, NP 10/01/16 1500

## 2016-10-01 NOTE — Discharge Instructions (Signed)
Follow up with your doctor if no improvement in the next 3-5 days.

## 2016-10-05 DIAGNOSIS — G4733 Obstructive sleep apnea (adult) (pediatric): Secondary | ICD-10-CM | POA: Diagnosis not present

## 2016-10-11 ENCOUNTER — Ambulatory Visit: Payer: Self-pay | Admitting: Internal Medicine

## 2016-10-14 MED FILL — CARBIDOPA-LEVODOPA 25-100 T: 25-100 | 90 days supply | Qty: 270 | Fill #0

## 2016-10-18 ENCOUNTER — Encounter: Payer: Self-pay | Admitting: Internal Medicine

## 2016-10-23 DIAGNOSIS — N183 Chronic kidney disease, stage 3 unspecified: Secondary | ICD-10-CM | POA: Insufficient documentation

## 2016-10-23 DIAGNOSIS — E1365 Other specified diabetes mellitus with hyperglycemia: Secondary | ICD-10-CM

## 2016-10-23 DIAGNOSIS — E559 Vitamin D deficiency, unspecified: Secondary | ICD-10-CM | POA: Insufficient documentation

## 2016-10-23 DIAGNOSIS — Z79899 Other long term (current) drug therapy: Secondary | ICD-10-CM | POA: Insufficient documentation

## 2016-10-23 DIAGNOSIS — E1322 Other specified diabetes mellitus with diabetic chronic kidney disease: Secondary | ICD-10-CM | POA: Insufficient documentation

## 2016-10-23 DIAGNOSIS — E1122 Type 2 diabetes mellitus with diabetic chronic kidney disease: Secondary | ICD-10-CM | POA: Insufficient documentation

## 2016-10-23 NOTE — Progress Notes (Signed)
Assessment and Plan:  Hypertension:  -Continue medication,  -monitor blood pressure at home.  -Continue DASH diet.   -Reminder to go to the ER if any CP, SOB, nausea, dizziness, severe HA, changes vision/speech, left arm numbness and tingling, and jaw pain.  Cholesterol: -Continue diet and exercise.  -Check cholesterol.   Diabetes: -recommend that if further worsening may need medication -try for weight loss for help with parkinsons -Continue diet and exercise.  -Check A1C  Vitamin D Def: -continue medications.   Parkinsons -cont medications -followed by neuro -some improvements  RCC -may need nephrectomy, just monitoring for now  Continue diet and meds as discussed. Further disposition pending results of labs. Future Appointments Date Time Provider [place]  12/06/2016 7:30 AM Kathrynn Ducking, MD [facility] None  01/03/2017 2:00 PM Vicie Mutters, PA-C [facility] None  02/15/2017 8:00 AM Rine, Selmer Dominion, OT [facility]  02/15/2017 8:15 AM Sharen Counter, CCC-SLP [facility]  02/15/2017 8:30 AM Frazier Butt, PT [facility]  06/27/2017 2:30 PM Ward Givens, NP [facility] None    HPI 61 y.o. male  presents for 3 month follow up with hypertension, hyperlipidemia, prediabetes and vitamin D.   His blood pressure has been controlled at home, today their BP is BP: 132/84.   He does not workout. He denies chest pain, shortness of breath, dizziness.   He is on cholesterol medication and denies myalgias. His cholesterol is at goal. The cholesterol last visit was:   Lab Results  Component Value Date   CHOL 185 07/13/2016   HDL 32 (L) 07/13/2016   LDLCALC 83 07/13/2016   TRIG 351 (H) 07/13/2016   CHOLHDL 5.8 (H) 07/13/2016   He has been working on diet and exercise for Diabetes with diabetic chronic kidney disease, he is on bASA, he is not on ACE/ARB, and denies paresthesia of the feet, polydipsia, polyuria and visual disturbances. He did have 5 days course  of prednisone for his back.  Last A1C was:  Lab Results  Component Value Date   HGBA1C 8.5 (H) 07/13/2016   Lab Results  Component Value Date   GFRNONAA 53 (L) 07/13/2016   Patient is on Vitamin D supplement.  Lab Results  Component Value Date   VD25OH 28 (L) 07/13/2016     Patient reports that he has been following with neurology and has been doing physical therapy for his parkinsons.  He reports that his right hand has still been very difficult.  He reports that he has been taking the mirapex and sinemet, follows up in august.    He is following with nephrology for possible RCC, it shrunk on its own so they will monitor it next year.   Patient is on allopurinol for gout and does not report a recent flare.  Lab Results  Component Value Date   LABURIC 4.6 07/13/2016   BMI is Body mass index is 45.98 kg/m., he is working on diet and exercise. He is on a CPAP machine, uses regularly.  Wt Readings from Last 3 Encounters:  10/24/16 293 lb 9.6 oz (133.2 kg)  09/28/16 298 lb (135.2 kg)  07/13/16 (!) 302 lb 9.6 oz (137.3 kg)    Current Medications:  Current Outpatient Prescriptions on File Prior to Visit  Medication Sig Dispense Refill  . allopurinol (ZYLOPRIM) 300 MG tablet TAKE 1 TABLET BY MOUTH DAILY. 90 tablet 2  . ALPRAZolam (XANAX) 0.5 MG tablet TAKE 1 TABLET BY MOUTH THREE TIMES DAILY 90 tablet 2  .  aspirin EC 81 MG tablet Take 81 mg by mouth at bedtime.    . carbidopa-levodopa (SINEMET IR) 25-100 MG tablet Take 1 tablet by mouth 3 (three) times daily. 270 tablet 2  . COLCRYS 0.6 MG tablet Take 1 tablet (0.6 mg total) by mouth 2 (two) times daily. 180 tablet 12  . CRESTOR 10 MG tablet Take 1 tablet (10 mg total) by mouth at bedtime. 90 tablet 3  . EPINEPHrine (EPIPEN 2-PAK) 0.3 mg/0.3 mL IJ SOAJ injection Inject 0.3 mLs (0.3 mg total) into the muscle once. 2 Device 1  . HYDROcodone-acetaminophen (NORCO/VICODIN) 5-325 MG tablet Take 1-2 tablets by mouth every 6 (six) hours as  needed. 10 tablet 0  . metFORMIN (GLUCOPHAGE XR) 500 MG 24 hr tablet Take 2 tablets 2 x / day for Diabetes 360 tablet 1  . methocarbamol (ROBAXIN) 500 MG tablet Take 1 tablet (500 mg total) by mouth every 8 (eight) hours as needed for muscle spasms. 24 tablet 0  . pramipexole (MIRAPEX) 0.25 MG tablet Take 1 tablet (0.25 mg total) by mouth 3 (three) times daily.    . predniSONE (DELTASONE) 20 MG tablet Take 3 tablets (60 mg total) by mouth daily. 15 tablet 0  . Probiotic Product (PROBIOTIC DAILY PO) Take 1 tablet by mouth daily.    . sitaGLIPtin (JANUVIA) 100 MG tablet Take 1 tablet (100 mg total) by mouth daily. 90 tablet 1  . valsartan-hydrochlorothiazide (DIOVAN-HCT) 320-25 MG tablet TAKE 1 TABLET BY MOUTH ONCE DAILY 90 tablet 1  . VENTOLIN HFA 108 (90 Base) MCG/ACT inhaler INHALE 2 PUFFS INTO THE LUNGS EVERY 6 HOURS AS NEEDED FOR WHEEZING OR SHORTNESS OF BREATH. 54 g 3  . albuterol (PROVENTIL HFA;VENTOLIN HFA) 108 (90 BASE) MCG/ACT inhaler Take 1 to 2 inhalations 4 x daily or every 4 hours if need to rescue asthma 18 g 99   No current facility-administered medications on file prior to visit.     Medical History:  Past Medical History:  Diagnosis Date  . Adult BMI 50.0-59.9 kg/sq m    52.77  . Anal fissure   . Anxiety   . Diabetes mellitus without complication (HCC)    borderline  . GERD (gastroesophageal reflux disease)    30 years ago, maybe from peptic ulcer  . H/O: gout    STABLE  . Hepatic steatosis 10/01/11  . History of kidney stones   . Hyperlipidemia   . Hypertension   . Kidney tumor    right upper kidney tumor, monitoring  . OSA on CPAP   . Parkinson disease ([place]) 01/04/2016  . Right ureteral stone   . Vitamin D deficiency   . Weakness     Allergies:  Allergies  Allergen Reactions  . Ceftriaxone Hives     Review of Systems:  Review of Systems  Constitutional: Negative for chills, fever and malaise/fatigue.  HENT: Negative for congestion, ear pain and sore  throat.   Eyes: Negative.   Respiratory: Negative for cough, shortness of breath and wheezing.   Cardiovascular: Negative for chest pain, palpitations and leg swelling.  Gastrointestinal: Negative for abdominal pain, blood in stool, constipation, diarrhea, heartburn and melena.  Genitourinary: Negative.   Skin: Negative.   Neurological: Negative for dizziness, sensory change, loss of consciousness and headaches.  Psychiatric/Behavioral: Negative for depression. The patient is not nervous/anxious and does not have insomnia.     Family history- Review and unchanged  Social history- Review and unchanged  Physical Exam: BP 132/84   Pulse  97   Temp 97.5 F (36.4 C)   Resp 20   Ht 5\' 7"  (1.702 m)   Wt 293 lb 9.6 oz (133.2 kg)   SpO2 98%   BMI 45.98 kg/m  Wt Readings from Last 3 Encounters:  10/24/16 293 lb 9.6 oz (133.2 kg)  09/28/16 298 lb (135.2 kg)  07/13/16 (!) 302 lb 9.6 oz (137.3 kg)    General Appearance: Well nourished well developed, in no apparent distress. Eyes: PERRLA, EOMs, conjunctiva no swelling or erythema ENT/Mouth: Ear canals normal without obstruction, swelling, erythma, discharge.  TMs normal bilaterally.  Oropharynx moist, clear, without exudate, or postoropharyngeal swelling. Neck: Supple, thyroid normal,no cervical adenopathy  Respiratory: Respiratory effort normal, Breath sounds clear A&P without rhonchi, wheeze, or rale.  No retractions, no accessory usage. Cardio: RRR with no MRGs. Brisk peripheral pulses without edema.  Abdomen: Soft, + BS,  Non tender, no guarding, rebound, hernias, masses. Musculoskeletal: Full ROM, 5/5 strength, Normal gait Skin: Warm, dry without rashes, lesions, ecchymosis.  Neuro: Awake and oriented X 3, Cranial nerves intact. Normal muscle tone, no cerebellar symptoms. Psych: Normal affect, Insight and Judgment appropriate.    Vicie Mutters, PA-C 3:43 PM [facility]

## 2016-10-24 ENCOUNTER — Encounter: Payer: Self-pay | Admitting: Physician Assistant

## 2016-10-24 ENCOUNTER — Ambulatory Visit (INDEPENDENT_AMBULATORY_CARE_PROVIDER_SITE_OTHER): Payer: 59 | Admitting: Physician Assistant

## 2016-10-24 VITALS — BP 132/84 | HR 97 | Temp 97.5°F | Resp 20 | Ht 67.0 in | Wt 293.6 lb

## 2016-10-24 DIAGNOSIS — E782 Mixed hyperlipidemia: Secondary | ICD-10-CM | POA: Diagnosis not present

## 2016-10-24 DIAGNOSIS — G20 Parkinson's disease: Secondary | ICD-10-CM | POA: Diagnosis not present

## 2016-10-24 DIAGNOSIS — Z79899 Other long term (current) drug therapy: Secondary | ICD-10-CM | POA: Diagnosis not present

## 2016-10-24 DIAGNOSIS — E6609 Other obesity due to excess calories: Secondary | ICD-10-CM

## 2016-10-24 DIAGNOSIS — I2510 Atherosclerotic heart disease of native coronary artery without angina pectoris: Secondary | ICD-10-CM | POA: Diagnosis not present

## 2016-10-24 DIAGNOSIS — E559 Vitamin D deficiency, unspecified: Secondary | ICD-10-CM | POA: Diagnosis not present

## 2016-10-24 DIAGNOSIS — E119 Type 2 diabetes mellitus without complications: Secondary | ICD-10-CM

## 2016-10-24 DIAGNOSIS — IMO0001 Reserved for inherently not codable concepts without codable children: Secondary | ICD-10-CM

## 2016-10-24 DIAGNOSIS — Z6841 Body Mass Index (BMI) 40.0 and over, adult: Secondary | ICD-10-CM

## 2016-10-24 DIAGNOSIS — I10 Essential (primary) hypertension: Secondary | ICD-10-CM

## 2016-10-24 LAB — CBC WITH DIFFERENTIAL/PLATELET
BASOS ABS: 0 {cells}/uL (ref 0–200)
Basophils Relative: 0 %
EOS ABS: 152 {cells}/uL (ref 15–500)
Eosinophils Relative: 2 %
HEMATOCRIT: 43.8 % (ref 38.5–50.0)
Hemoglobin: 14.7 g/dL (ref 13.2–17.1)
LYMPHS PCT: 29 %
Lymphs Abs: 2204 cells/uL (ref 850–3900)
MCH: 27.4 pg (ref 27.0–33.0)
MCHC: 33.6 g/dL (ref 32.0–36.0)
MCV: 81.7 fL (ref 80.0–100.0)
MONOS PCT: 9 %
MPV: 10.4 fL (ref 7.5–12.5)
Monocytes Absolute: 684 cells/uL (ref 200–950)
NEUTROS ABS: 4560 {cells}/uL (ref 1500–7800)
Neutrophils Relative %: 60 %
PLATELETS: 147 10*3/uL (ref 140–400)
RBC: 5.36 MIL/uL (ref 4.20–5.80)
RDW: 15.3 % — ABNORMAL HIGH (ref 11.0–15.0)
WBC: 7.6 10*3/uL (ref 3.8–10.8)

## 2016-10-24 MED ORDER — CRESTOR 10 MG PO TABS: 10.0000 mg | ORAL_TABLET | Freq: Every day | ORAL | 3 refills | Status: DC

## 2016-10-24 NOTE — Patient Instructions (Signed)
Diabetes is a very complicated disease...lets simplify it.  An easy way to look at it to understand the complications is if you think of the extra sugar floating in your blood stream as glass shards floating through your blood stream.    Diabetes affects your small vessels first: 1) The glass shards (sugar) scraps down the tiny blood vessels in your eyes and lead to diabetic retinopathy, the leading cause of blindness in the [place]. Diabetes is the leading cause of newly diagnosed adult (43 to 62 years of age) blindness in the [place].  2) The glass shards scratches down the tiny vessels of your legs leading to nerve damage called neuropathy and can lead to amputations of your feet. More than 60% of all non-traumatic amputations of lower limbs occur in people with diabetes.  3) Over time the small vessels in your brain are shredded and closed off, individually this does not cause any problems but over a long period of time many of the small vessels being blocked can lead to Vascular Dementia.   4) Your kidney's are a filter system and have a "net" that keeps certain things in the body and lets bad things out. Sugar shreds this net and leads to kidney damage and eventually failure. Decreasing the sugar that is destroying the net and certain blood pressure medications can help stop or decrease progression of kidney disease. Diabetes was the primary cause of kidney failure in 44 percent of all new cases in 2011.  5) Diabetes also destroys the small vessels in your penis that lead to erectile dysfunction. Eventually the vessels are so damaged that you may not be responsive to cialis or viagra.   Diabetes and your large vessels: Your larger vessels consist of your coronary arteries in your heart and the carotid vessels to your brain. Diabetes or even increased sugars put you at 300% increased risk of heart attack and stroke and this is why.. The sugar scrapes down your large blood vessels and your body  sees this as an internal injury and tries to repair itself. Just like you get a scab on your skin, your platelets will stick to the blood vessel wall trying to heal it. This is why we have diabetics on low dose aspirin daily, this prevents the platelets from sticking and can prevent plaque formation. In addition, your body takes cholesterol and tries to shove it into the open wound. This is why we want your LDL, or bad cholesterol, below 70.   The combination of platelets and cholesterol over 5-10 years forms plaque that can break off and cause a heart attack or stroke.   PLEASE REMEMBER:  Diabetes is preventable! Up to 59 percent of complications and morbidities among individuals with type 2 diabetes can be prevented, delayed, or effectively treated and minimized with regular visits to a health professional, appropriate monitoring and medication, and a healthy diet and lifestyle.     Bad carbs also include fruit juice, alcohol, and sweet tea. These are empty calories that do not signal to your brain that you are full.   Please remember the good carbs are still carbs which convert into sugar. So please measure them out no more than 1/2-1 cup of rice, oatmeal, pasta, and beans  Veggies are however free foods! Pile them on.   Not all fruit is created equal. Please see the list below, the fruit at the bottom is higher in sugars than the fruit at the top. Please avoid all dried fruits.  Simple math prevails.    1st - exercise does not produce significant weight loss - at best one converts fat into muscle , "bulks up", loses inches, but usually stays "weight neutral"     2nd - think of your body weightas a check book: If you eat more calories than you burn up - you save money or gain weight .... Or if you spend more money than you put in the check book, ie burn up more calories than you eat, then you lose weight     3rd - if you walk or run 1 mile, you burn up 100 calories - you have to burn  up 3,500 calories to lose 1 pound, ie you have to walk/run 35 miles to lose 1 measly pound. So if you want to lose 10 #, then you have to walk/run 350 miles, so.... clearly exercise is not the solution.     4. So if you consume 1,500 calories, then you have to burn up the equivalent of 15 miles to stay weight neutral - It also stands to reason that if you consume 1,500 cal/day and don't lose weight, then you must be burning up about 1,500 cals/day to stay weight neutral.     5. If you really want to lose weight, you must cut your calorie intake 300 calories /day and at that rate you should lose about 1 # every 3 days.   6. Please purchase Dr Fara Olden Fuhrman's book(s) "The End of Dieting" & "Eat to Live" . It has some great concepts and recipes.      We want weight loss that will last so you should lose 1-2 pounds a week.  THAT IS IT! Please pick THREE things a month to change. Once it is a habit check off the item. Then pick another three items off the list to become habits.  If you are already doing a habit on the list GREAT!  Cross that item off! o Don't drink your calories. Ie, alcohol, soda, fruit juice, and sweet tea.  o Drink more water. Drink a glass when you feel hungry or before each meal.  o Eat breakfast - Complex carb and protein (likeDannon light and fit yogurt, oatmeal, fruit, eggs, [place] bacon). o Measure your cereal.  Eat no more than one cup a day. (ie [place]) o Eat an apple a day. o Add a vegetable a day. o Try a new vegetable a month. o Use Pam! Stop using oil or butter to cook. o Don't finish your plate or use smaller plates. o Share your dessert. o Eat sugar free Jello for dessert or frozen grapes. o Don't eat 2-3 hours before bed. o Switch to whole wheat bread, pasta, and brown rice. o Make healthier choices when you eat out. No fries! o Pick baked chicken, NOT fried. o Don't forget to SLOW DOWN when you eat. It is not going anywhere.  o Take the stairs. o Park far  away in the parking lot o [employer] (or weights) for 10 minutes while watching TV. o Walk at work for 10 minutes during break. o Walk outside 1 time a week with your friend, kids, dog, or significant other. o Start a walking group at [place] the mall as much as you can tolerate.  o Keep a food diary. o Weigh yourself daily. o Walk for 15 minutes 3 days per week. o Cook at home more often and eat out less.  If life happens and  you go back to old habits, it is okay.  Just start over. You can do it!   If you experience chest pain, get short of breath, or tired during the exercise, please stop immediately and inform your doctor.

## 2016-10-25 LAB — LIPID PANEL
CHOLESTEROL: 203 mg/dL — AB (ref <200)
HDL: 30 mg/dL — ABNORMAL LOW (ref >40)
Total CHOL/HDL Ratio: 6.8 Ratio — ABNORMAL HIGH (ref <5.0)
Triglycerides: 462 mg/dL — ABNORMAL HIGH (ref <150)

## 2016-10-25 LAB — BASIC METABOLIC PANEL WITH GFR
BUN: 18 mg/dL (ref 7–25)
CALCIUM: 9.6 mg/dL (ref 8.6–10.3)
CO2: 24 mmol/L (ref 20–31)
CREATININE: 1.3 mg/dL — AB (ref 0.70–1.25)
Chloride: 99 mmol/L (ref 98–110)
GFR, Est African American: 68 mL/min (ref >=60)
GFR, Est Non African American: 58 mL/min — ABNORMAL LOW (ref >=60)
Glucose, Bld: 291 mg/dL — ABNORMAL HIGH (ref 65–99)
Potassium: 3.6 mmol/L (ref 3.5–5.3)
SODIUM: 136 mmol/L (ref 135–146)

## 2016-10-25 LAB — HEPATIC FUNCTION PANEL
ALBUMIN: 3.8 g/dL (ref 3.6–5.1)
ALT: 15 U/L (ref 9–46)
AST: 17 U/L (ref 10–35)
Alkaline Phosphatase: 58 U/L (ref 40–115)
BILIRUBIN DIRECT: 0.1 mg/dL (ref <=0.2)
Indirect Bilirubin: 0.4 mg/dL (ref 0.2–1.2)
Total Bilirubin: 0.5 mg/dL (ref 0.2–1.2)
Total Protein: 7 g/dL (ref 6.1–8.1)

## 2016-10-25 LAB — HEMOGLOBIN A1C
Hgb A1c MFr Bld: 10.4 % — ABNORMAL HIGH (ref <5.7)
MEAN PLASMA GLUCOSE: 252 mg/dL

## 2016-10-25 LAB — MAGNESIUM: MAGNESIUM: 1.7 mg/dL (ref 1.5–2.5)

## 2016-10-25 LAB — TSH: TSH: 2.28 m[IU]/L (ref 0.40–4.50)

## 2016-10-25 MED FILL — ALPRAZolam 0.5 MG TABS: 0.5 | 30 days supply | Qty: 90 | Fill #1

## 2016-10-25 MED FILL — METFORMIN HCL ER 500 MG TAB: 500 | 90 days supply | Qty: 360 | Fill #1

## 2016-10-25 MED FILL — ALLOPURINOL 300 MG TAB: 300 | 90 days supply | Qty: 90 | Fill #2

## 2016-10-26 ENCOUNTER — Other Ambulatory Visit: Payer: Self-pay

## 2016-10-26 DIAGNOSIS — E785 Hyperlipidemia, unspecified: Secondary | ICD-10-CM

## 2016-10-26 MED ORDER — ROSUVASTATIN CALCIUM 10 MG PO TABS: 10.0000 mg | ORAL_TABLET | Freq: Every day | ORAL | 2 refills | Status: DC

## 2016-10-26 MED FILL — ROSUVASTATIN CALCIUM 10 MG: 10 | 90 days supply | Qty: 90 | Fill #0

## 2016-11-17 ENCOUNTER — Other Ambulatory Visit: Payer: Self-pay | Admitting: Internal Medicine

## 2016-11-17 MED ORDER — SITAGLIPTIN PHOSPHATE 100 MG PO TABS: ORAL_TABLET | ORAL | 1 refills | Status: DC

## 2016-11-17 MED FILL — JANUVIA 100 MG TABLET: 100 | 30 days supply | Qty: 30 | Fill #0

## 2016-12-06 ENCOUNTER — Encounter: Payer: Self-pay | Admitting: Neurology

## 2016-12-06 ENCOUNTER — Ambulatory Visit (INDEPENDENT_AMBULATORY_CARE_PROVIDER_SITE_OTHER): Payer: 59 | Admitting: Neurology

## 2016-12-06 VITALS — BP 148/89 | HR 76 | Ht 67.0 in | Wt 295.5 lb

## 2016-12-06 DIAGNOSIS — G20 Parkinson's disease: Secondary | ICD-10-CM | POA: Diagnosis not present

## 2016-12-06 DIAGNOSIS — G4752 REM sleep behavior disorder: Secondary | ICD-10-CM | POA: Insufficient documentation

## 2016-12-06 HISTORY — DX: REM sleep behavior disorder: G47.52

## 2016-12-06 MED ORDER — PRAMIPEXOLE DIHYDROCHLORIDE 0.5 MG PO TABS: 0.5000 mg | ORAL_TABLET | Freq: Three times a day (TID) | ORAL | 2 refills | Status: DC

## 2016-12-06 MED FILL — VALSARTAN-HCTZ 320-25 MG TA: 320-25 | 30 days supply | Qty: 30 | Fill #1

## 2016-12-06 MED FILL — PRAMIPEXOLE 0.5 MG TABLET: 0.5 | 90 days supply | Qty: 270 | Fill #0

## 2016-12-06 NOTE — Patient Instructions (Signed)
   We will increase the Mirapex to 0.5 mg three times a day.   Week 1-2  Take 0.25/  0.25/  0.5  Week 3-4  Take 0.5/  0.25/  0.5  Then start 0.5 mg three times a day.  Mirapex (pramipexole) may result in confusion or hallucinations, dizziness, drowsiness, or nausea. If any significant side effects are noted, please contact our office.

## 2016-12-06 NOTE — Progress Notes (Signed)
Reason for visit: Parkinson's disease  Todd Rice is an 61 y.o. male  History of present illness:  Todd Rice is a 61 year old right-handed white male with a history of Parkinson's disease. The patient has primarily right-sided features, he currently is retired, he is under much less stress at this time. He has sleep apnea, he is on CPAP. He reports that he is having occasional night sweats with vivid dreams, he may act out his dreams at times. The patient is trying to remain active, he will walk on a daily basis, he just got a puppy and this is helping him to stay active. The patient has been taking Sinemet 25/100 mg tablets, one full tablet 3 times daily. He is on low-dose Mirapex taking 0.25 mg 3 times daily. He has not had any falls, he denies problems with choking with swallowing. The patient has lost several pounds with the exercise program. He was in the emergency room on 10/01/2016 with low back pain, he was given a trial on prednisone which seemed to help his back pain, this has not been a issue for him since that time.  Past Medical History:  Diagnosis Date  . Adult BMI 50.0-59.9 kg/sq m    52.77  . Anal fissure   . Anxiety   . Diabetes mellitus without complication (HCC)    borderline  . GERD (gastroesophageal reflux disease)    30 years ago, maybe from peptic ulcer  . H/O: gout    STABLE  . Hepatic steatosis 10/01/11  . History of kidney stones   . Hyperlipidemia   . Hypertension   . Kidney tumor    right upper kidney tumor, monitoring  . OSA on CPAP   . Parkinson disease ([place]) 01/04/2016  . Right ureteral stone   . Vitamin D deficiency   . Weakness     Past Surgical History:  Procedure Laterality Date  . ANAL FISSURE REPAIR  10-12-2000  . COLONOSCOPY N/A 07/01/2012   Procedure: COLONOSCOPY;  Surgeon: Lafayette Dragon, MD;  Location: [facility];  Service: Endoscopy;  Laterality: N/A;  . CYSTOSCOPY WITH RETROGRADE PYELOGRAM, URETEROSCOPY AND STENT PLACEMENT  Bilateral 02/26/2013   Procedure: CYSTOSCOPY WITH RETROGRADE PYELOGRAM, URETEROSCOPY AND STENT PLACEMENT;  Surgeon: Alexis Frock, MD;  Location: [facility];  Service: Urology;  Laterality: Bilateral;  . HOLMIUM LASER APPLICATION Bilateral 97/98/9211   Procedure: HOLMIUM LASER APPLICATION;  Surgeon: Alexis Frock, MD;  Location: [facility];  Service: Urology;  Laterality: Bilateral;  . kidney stone removal     06/2011-stent placement  . LEFT MEDIAL FEMORAL CONDYLE DEBRIDEMENT & DRILLING/ REMOVAL LOOSE BODY  09-15-2003  . NASAL SEPTUM SURGERY  1985  . STONE EXTRACTION WITH BASKET  06/12/2011   Procedure: STONE EXTRACTION WITH BASKET;  Surgeon: Claybon Jabs, MD;  [facility];  Service: Urology;  Laterality: Right;    Family History  Problem Relation Age of Onset  . Heart disease Mother        Atrial fibrillation  . Hypertension Mother   . Hyperlipidemia Mother   . Lung cancer Father        was a smoker  . Cancer Father        lung  . Hyperlipidemia Brother   . Heart disease Maternal Aunt   . Hyperlipidemia Maternal Aunt   . Hypertension Maternal Aunt   . Stroke Maternal Aunt   . Heart disease Paternal Grandmother   . Hyperlipidemia Paternal Grandmother  Social history:  reports that he has never smoked. He has never used smokeless tobacco. He reports that he does not drink alcohol or use drugs.    Allergies  Allergen Reactions  . Ceftriaxone Hives    Medications:  Prior to Admission medications   Medication Sig Start Date End Date Taking? Authorizing Provider  allopurinol (ZYLOPRIM) 300 MG tablet TAKE 1 TABLET BY MOUTH DAILY. 12/20/15  Yes Unk Pinto, MD  ALPRAZolam Duanne Moron) 0.5 MG tablet TAKE 1 TABLET BY MOUTH THREE TIMES DAILY 09/14/16  Yes Unk Pinto, MD  aspirin EC 81 MG tablet Take 81 mg by mouth at bedtime.   Yes [provider]  carbidopa-levodopa (SINEMET IR) 25-100 MG tablet Take 1 tablet by mouth 3 (three) times daily.  09/28/16  Yes Kathrynn Ducking, MD  COLCRYS 0.6 MG tablet Take 1 tablet (0.6 mg total) by mouth 2 (two) times daily. 12/11/14  Yes Forcucci, Courtney, PA-C  CRESTOR 10 MG tablet Take 1 tablet (10 mg total) by mouth at bedtime. 10/24/16  Yes Vicie Mutters, PA-C  EPINEPHrine (EPIPEN 2-PAK) 0.3 mg/0.3 mL IJ SOAJ injection Inject 0.3 mLs (0.3 mg total) into the muscle once. 03/27/14  Yes Kelby Aline, PA-C  HYDROcodone-acetaminophen (NORCO/VICODIN) 5-325 MG tablet Take 1-2 tablets by mouth every 6 (six) hours as needed. 10/01/16  Yes Nehemiah Settle, NP  metFORMIN (GLUCOPHAGE XR) 500 MG 24 hr tablet Take 2 tablets 2 x / day for Diabetes 07/14/16 01/14/17 Yes Unk Pinto, MD  methocarbamol (ROBAXIN) 500 MG tablet Take 1 tablet (500 mg total) by mouth every 8 (eight) hours as needed for muscle spasms. 10/01/16  Yes Nehemiah Settle, NP  pramipexole (MIRAPEX) 0.25 MG tablet Take 1 tablet (0.25 mg total) by mouth 3 (three) times daily. 07/06/16  Yes Kathrynn Ducking, MD  Probiotic Product (PROBIOTIC DAILY PO) Take 1 tablet by mouth daily.   Yes [provider]  rosuvastatin (CRESTOR) 10 MG tablet Take 1 tablet (10 mg total) by mouth daily. 10/26/16  Yes Vicie Mutters, PA-C  sitaGLIPtin (JANUVIA) 100 MG tablet Take 1 tablet daily for Diabetes 11/17/16 05/20/17 Yes Unk Pinto, MD  valsartan-hydrochlorothiazide (DIOVAN-HCT) 320-25 MG tablet TAKE 1 TABLET BY MOUTH ONCE DAILY 09/14/16  Yes Unk Pinto, MD  VENTOLIN HFA 108 (90 Base) MCG/ACT inhaler INHALE 2 PUFFS INTO THE LUNGS EVERY 6 HOURS AS NEEDED FOR WHEEZING OR SHORTNESS OF BREATH. 07/17/16  Yes Unk Pinto, MD  albuterol (PROVENTIL HFA;VENTOLIN HFA) 108 (90 BASE) MCG/ACT inhaler Take 1 to 2 inhalations 4 x daily or every 4 hours if need to rescue asthma 12/15/14 04/10/16  Unk Pinto, MD    ROS:  Out of a complete 14 system review of symptoms, the patient complains only of the following symptoms, and all other reviewed systems  are negative.  Cough Constipation Acting out dreams  Blood pressure (!) 148/89, pulse 76, height 5\' 7"  (1.702 m), weight 295 lb 8 oz (134 kg).  Physical Exam  General: The patient is alert and cooperative at the time of the examination. The patient is markedly obese.  Skin: Slight edema of ankles is noted.   Neurologic Exam  Mental status: The patient is alert and oriented x 3 at the time of the examination. The patient has apparent normal recent and remote memory, with an apparently normal attention span and concentration ability.   Cranial nerves: Facial symmetry is present. Speech is normal, no aphasia or dysarthria is noted. Extraocular movements are full. Visual fields are full.  Masking of the face is seen.  Motor: The patient has good strength in all 4 extremities.  Sensory examination: Soft touch sensation is symmetric on the face, arms, and legs.  Coordination: The patient has good finger-nose-finger and heel-to-shin bilaterally.  Gait and station: The patient is able to arise from a seated position with arms crossed. Once up, he can walk independently with good stride and good turns. Decreased arm swing on the right is noted. Tandem gait is normal. Romberg is negative. No drift is seen.  Reflexes: Deep tendon reflexes are symmetric.   Assessment/Plan:  1. Parkinson's disease  2. Sleep apnea on CPAP  3. Diabetes  4. Obesity  5. REM sleep disorder  The patient now is retired, he is encouraged to continue a regular exercise program, hopefully this will result in multiple benefits with his medical issues with improvement in weight, diabetes control, improvement in sleep apnea control, and maintain a slower rate of progression for his Parkinson's disease. The patient will be increased on the Mirapex, he will convert to 0.5 mg 3 times daily, slowly phasing in the dosing over 4 weeks. He will contact me if he has any side effects on the medication. He will follow-up in  5 months. If the REM sleep disorder worsens, he will contact our office.  Jill Alexanders MD 12/06/2016 7:29 AM  [facility] [address] [place], [place] [address]  Phone [phone] Fax [phone]

## 2016-12-18 ENCOUNTER — Encounter: Payer: Self-pay | Admitting: Internal Medicine

## 2016-12-18 MED FILL — JANUVIA 100 MG TABLET: 100 | 30 days supply | Qty: 30 | Fill #1

## 2017-01-02 NOTE — Progress Notes (Signed)
Complete Physical  Assessment and Plan:  Encounter for general adult medical examination with abnormal findings 1 year  Coronary artery disease involving native coronary artery of native heart without angina pectoris Control blood pressure, cholesterol, glucose, increase exercise.   Essential hypertension - continue medications, DASH diet, exercise and monitor at home. Call if greater than 130/80.  -     CBC with Differential/Platelet -     BASIC METABOLIC PANEL WITH GFR -     Hepatic function panel -     TSH -     Urinalysis, Routine w reflex microscopic -     Microalbumin / creatinine urine ratio -     EKG 12-Lead  Type 2 diabetes mellitus without complication, without long-term current use of insulin ([place]) Discussed general issues about diabetes pathophysiology and management., Educational material distributed., Suggested low cholesterol diet., Encouraged aerobic exercise., Discussed foot care., Reminded to get yearly retinal exam. -     Hemoglobin A1c  Parkinson disease ([place]) Continue neuro follow up and meds  Hyperlipidemia, unspecified hyperlipidemia type -continue medications, check lipids, decrease fatty foods, increase activity.  -     Lipid panel  Class 3 obesity due to excess calories with serious comorbidity and body mass index (BMI) of 45.0 to 49.9 in adult [facility]) - long discussion about weight loss, diet, and exercise -     Lipid panel -     Hemoglobin A1c  Medication management -     Magnesium  Vitamin D deficiency -     VITAMIN D 25 Hydroxy (Vit-D Deficiency, Fractures)  Seasonal and perennial allergic rhinitis - Allegra OTC, increase H20, allergy hygiene explained.  Asthma, mild intermittent, well-controlled Weight loss   Hemorrhage of rectum and anus Follow up GI  Ureteral calculus, right Monitor  Pseudomonas aeruginosa colonization Monitor  REM sleep behavior disorder continue neuro follow up  Screening for prostate cancer -      PSA  Anemia, unspecified type -     Iron,Total/Total Iron Binding Cap -     Vitamin B12  OSA treated with BiPAP Sleep apnea- continue CPAP, CPAP is helping with daytime fatigue, weight loss still advised.   Uncontrolled secondary diabetes mellitus with stage 3 CKD (GFR 30-59) (HCC) Discussed general issues about diabetes pathophysiology and management., Educational material distributed., Suggested low cholesterol diet., Encouraged aerobic exercise., Discussed foot care., Reminded to get yearly retinal exam.  Controlled type 2 diabetes mellitus with diabetic dermatitis, without long-term current use of insulin ([place]) Discussed general issues about diabetes pathophysiology and management., Educational material distributed., Suggested low cholesterol diet., Encouraged aerobic exercise., Discussed foot care., Reminded to get yearly retinal exam. -     clotrimazole-betamethasone (LOTRISONE) cream; Apply 1 application topically 2 (two) times daily.   Discussed med's effects and SE's. Screening labs and tests as requested with regular follow-up as recommended.  [name]   His blood pressure has been controlled at home, today their BP is BP: 126/82 He does workout. He denies chest pain, shortness of breath, dizziness.   Patient reports that he has been following with neurology for parkinson. He reports that he has been taking the mirapex and sinemet, follows up in august.    He is following with nephrology for possible RCC, it shrunk on its own so they will monitor it next year.  He is on cholesterol medication and denies myalgias. His cholesterol is not at goal. The cholesterol last visit was:   Lab Results  Component Value  Date   CHOL 203 (H) 10/24/2016   HDL 30 (L) 10/24/2016   LDLCALC NOT CALC 10/24/2016   TRIG 462 (H) 10/24/2016   CHOLHDL 6.8 (H) 10/24/2016   He has been working on diet and exercise for diabetes, he is on metformin 4 a day,  januvia at lunch, stopped taking glipizide due to blurry vision. He admits to poor eating.   His sugars have been running 120-150's, he is not on bASA, he is on ACE/ARB and denies foot ulcerations, hyperglycemia, hypoglycemia , increased appetite, nausea, paresthesia of the feet, polydipsia, polyuria, visual disturbances, vomiting and weight loss. Last A1C in the office was:  Lab Results  Component Value Date   HGBA1C 10.4 (H) 10/24/2016   Lab Results  Component Value Date   GFRNONAA 58 (L) 10/24/2016    Patient is on Vitamin D supplement.   Lab Results  Component Value Date   VD25OH 28 (L) 07/13/2016   Last PSA was: Lab Results  Component Value Date   PSA 1.8 12/17/2015   BMI is Body mass index is 45.86 kg/m., he is working on diet and exercise. Has OSA and is on Bipap.  Wt Readings from Last 3 Encounters:  01/03/17 292 lb 12.8 oz (132.8 kg)  12/06/16 295 lb 8 oz (134 kg)  10/24/16 293 lb 9.6 oz (133.2 kg)   Current Medications:  Current Outpatient Prescriptions on File Prior to Visit  Medication Sig Dispense Refill  . allopurinol (ZYLOPRIM) 300 MG tablet TAKE 1 TABLET BY MOUTH DAILY. 90 tablet 2  . ALPRAZolam (XANAX) 0.5 MG tablet TAKE 1 TABLET BY MOUTH THREE TIMES DAILY 90 tablet 2  . aspirin EC 81 MG tablet Take 81 mg by mouth at bedtime.    . carbidopa-levodopa (SINEMET IR) 25-100 MG tablet Take 1 tablet by mouth 3 (three) times daily. 270 tablet 2  . COLCRYS 0.6 MG tablet Take 1 tablet (0.6 mg total) by mouth 2 (two) times daily. 180 tablet 12  . CRESTOR 10 MG tablet Take 1 tablet (10 mg total) by mouth at bedtime. 90 tablet 3  . EPINEPHrine (EPIPEN 2-PAK) 0.3 mg/0.3 mL IJ SOAJ injection Inject 0.3 mLs (0.3 mg total) into the muscle once. 2 Device 1  . metFORMIN (GLUCOPHAGE XR) 500 MG 24 hr tablet Take 2 tablets 2 x / day for Diabetes 360 tablet 1  . methocarbamol (ROBAXIN) 500 MG tablet Take 1 tablet (500 mg total) by mouth every 8 (eight) hours as needed for muscle  spasms. 24 tablet 0  . pramipexole (MIRAPEX) 0.5 MG tablet Take 1 tablet (0.5 mg total) by mouth 3 (three) times daily. 270 tablet 2  . Probiotic Product (PROBIOTIC DAILY PO) Take 1 tablet by mouth daily.    . rosuvastatin (CRESTOR) 10 MG tablet Take 1 tablet (10 mg total) by mouth daily. 30 tablet 2  . sitaGLIPtin (JANUVIA) 100 MG tablet Take 1 tablet daily for Diabetes 90 tablet 1  . valsartan-hydrochlorothiazide (DIOVAN-HCT) 320-25 MG tablet TAKE 1 TABLET BY MOUTH ONCE DAILY 90 tablet 1  . VENTOLIN HFA 108 (90 Base) MCG/ACT inhaler INHALE 2 PUFFS INTO THE LUNGS EVERY 6 HOURS AS NEEDED FOR WHEEZING OR SHORTNESS OF BREATH. 54 g 3  . albuterol (PROVENTIL HFA;VENTOLIN HFA) 108 (90 BASE) MCG/ACT inhaler Take 1 to 2 inhalations 4 x daily or every 4 hours if need to rescue asthma 18 g 99   No current facility-administered medications on file prior to visit.     Health Maintenance:  Immunization History  Administered Date(s) Administered  . Influenza-Unspecified 02/03/2013, 02/05/2014  . Pneumococcal-Unspecified 11/05/2012  . Tdap 03/26/2015   Tetanus: 2016 Pneumovax: 2014 Flu vaccine: 2016  Colonoscopy: 2014 Eye Exam: Dr. Clydene Laming April 2018 Sleep study 2017 CTA 2014  [name], [name] Keene Breath., [name], [name], MD (Inactive) as Consulting Physician (Gastroenterology) [name], PA-C as Physician Assistant (Emergency Medicine)   Medical History:  Past Medical History:  Diagnosis Date  . Adult BMI 50.0-59.9 kg/sq m    52.77  . Anal fissure   . Anxiety   . Diabetes mellitus without complication (HCC)    borderline  . GERD (gastroesophageal reflux disease)    30 years ago, maybe from peptic ulcer  . H/O: gout    STABLE  . Hepatic steatosis 10/01/11  . History of kidney stones   . Hyperlipidemia   . Hypertension   . Kidney tumor     right upper kidney tumor, monitoring  . OSA on CPAP   . Parkinson disease ([place]) 01/04/2016  . REM sleep behavior disorder 12/06/2016  . Right ureteral stone   . Vitamin D deficiency   . Weakness    Allergies Allergies  Allergen Reactions  . Ceftriaxone Hives    SURGICAL HISTORY He  has a past surgical history that includes Anal fissure repair (10-12-2000); Nasal septum surgery (1985); LEFT MEDIAL FEMORAL CONDYLE DEBRIDEMENT & DRILLING/ REMOVAL LOOSE BODY (09-15-2003); Stone extraction with basket (06/12/2011); kidney stone removal; Colonoscopy (N/A, 07/01/2012); Cystoscopy with retrograde pyelogram, ureteroscopy and stent placement (Bilateral, 02/26/2013); and Holmium laser application (Bilateral, 02/26/2013). FAMILY HISTORY His family history includes Cancer in his father; Heart disease in his maternal aunt, mother, and paternal grandmother; Hyperlipidemia in his brother, maternal aunt, mother, and paternal grandmother; Hypertension in his maternal aunt and mother; Lung cancer in his father; Stroke in his maternal aunt. SOCIAL HISTORY He  reports that he has never smoked. He has never used smokeless tobacco. He reports that he does not drink alcohol or use drugs.  Review of Systems:  Review of Systems  Constitutional: Positive for malaise/fatigue. Negative for chills, diaphoresis, fever and weight loss.  HENT: Negative for congestion, ear pain, hearing loss and sore throat.   Eyes: Negative.   Respiratory: Negative for cough, shortness of breath and wheezing.   Cardiovascular: Negative for chest pain, palpitations, orthopnea, claudication, leg swelling and PND.  Gastrointestinal: Negative for blood in stool, constipation, diarrhea, heartburn and melena.  Genitourinary: Negative.   Skin: Negative.   Neurological: Positive for sensory change and focal weakness. Negative for dizziness, loss of consciousness and headaches.  Psychiatric/Behavioral: Negative for depression and memory loss. The  patient is not nervous/anxious and does not have insomnia.     Physical Exam: Estimated body mass index is 45.86 kg/m as calculated from the following:   Height as of this encounter: 5\' 7"  (1.702 m).   Weight as of this encounter: 292 lb 12.8 oz (132.8 kg). BP 126/82   Pulse 84   Temp (!) 97.5 F (36.4 C)   Resp 18   Ht 5\' 7"  (1.702 m)   Wt 292 lb 12.8 oz (132.8 kg)   SpO2 98%   BMI 45.86 kg/m   General Appearance: Well nourished, in no apparent distress.  Eyes: PERRLA, EOMs, conjunctiva no swelling or erythema ENT/Mouth: Ear canals clear bilaterally with no erythema, swelling, discharge.  TMs normal bilaterally with  no erythema, bulging, or retractions.  Oropharynx clear and moist with no exudate, swelling, or erythema.  Dentition normal.   Neck: Supple, thyroid normal. No bruits, JVD, cervical adenopathy Respiratory: Respiratory effort normal, BS equal bilaterally without rales, rhonchi, wheezing or stridor.  Cardio: RRR without murmurs, rubs or gallops. Brisk peripheral pulses without edema.  Chest: symmetric, with normal excursions Abdomen: Soft, nontender, no guarding, rebound, hernias, masses, or organomegaly. Genitourinary:  Musculoskeletal: Full ROM all peripheral extremities,5/5 strength, and normal gait.  Skin: Warm, dry without rashes, lesions, ecchymosis. Neuro: A&Ox3, Cranial nerves intact, normal gait.  Overshoot of the right hand with finger to nose.  Tremor of the left hand with finger opposition>tremor of the right hand with finger opposition.  Weakness in the right lower extremity 4/5 strength with knee flexion and plantar and dorsiflexion.   Psych: Normal affect, Insight and Judgment appropriate.   EKG: WNL no changes.  Over 40 minutes of exam, counseling, chart review and critical decision making was performed  Vicie Mutters 2:17 PM [facility]

## 2017-01-03 ENCOUNTER — Ambulatory Visit (INDEPENDENT_AMBULATORY_CARE_PROVIDER_SITE_OTHER): Payer: 59 | Admitting: Physician Assistant

## 2017-01-03 ENCOUNTER — Encounter: Payer: Self-pay | Admitting: Physician Assistant

## 2017-01-03 VITALS — BP 126/82 | HR 84 | Temp 97.5°F | Resp 18 | Ht 67.0 in | Wt 292.8 lb

## 2017-01-03 DIAGNOSIS — I2510 Atherosclerotic heart disease of native coronary artery without angina pectoris: Secondary | ICD-10-CM

## 2017-01-03 DIAGNOSIS — E559 Vitamin D deficiency, unspecified: Secondary | ICD-10-CM

## 2017-01-03 DIAGNOSIS — J4520 Mild intermittent asthma, uncomplicated: Secondary | ICD-10-CM

## 2017-01-03 DIAGNOSIS — Z125 Encounter for screening for malignant neoplasm of prostate: Secondary | ICD-10-CM

## 2017-01-03 DIAGNOSIS — Z79899 Other long term (current) drug therapy: Secondary | ICD-10-CM

## 2017-01-03 DIAGNOSIS — IMO0001 Reserved for inherently not codable concepts without codable children: Secondary | ICD-10-CM

## 2017-01-03 DIAGNOSIS — Z2239 Carrier of other specified bacterial diseases: Secondary | ICD-10-CM

## 2017-01-03 DIAGNOSIS — N183 Chronic kidney disease, stage 3 (moderate): Secondary | ICD-10-CM

## 2017-01-03 DIAGNOSIS — D649 Anemia, unspecified: Secondary | ICD-10-CM

## 2017-01-03 DIAGNOSIS — Z0000 Encounter for general adult medical examination without abnormal findings: Secondary | ICD-10-CM

## 2017-01-03 DIAGNOSIS — G4752 REM sleep behavior disorder: Secondary | ICD-10-CM

## 2017-01-03 DIAGNOSIS — E1365 Other specified diabetes mellitus with hyperglycemia: Secondary | ICD-10-CM

## 2017-01-03 DIAGNOSIS — Z136 Encounter for screening for cardiovascular disorders: Secondary | ICD-10-CM

## 2017-01-03 DIAGNOSIS — K625 Hemorrhage of anus and rectum: Secondary | ICD-10-CM

## 2017-01-03 DIAGNOSIS — E1322 Other specified diabetes mellitus with diabetic chronic kidney disease: Secondary | ICD-10-CM

## 2017-01-03 DIAGNOSIS — E119 Type 2 diabetes mellitus without complications: Secondary | ICD-10-CM

## 2017-01-03 DIAGNOSIS — Z0001 Encounter for general adult medical examination with abnormal findings: Secondary | ICD-10-CM

## 2017-01-03 DIAGNOSIS — G4733 Obstructive sleep apnea (adult) (pediatric): Secondary | ICD-10-CM | POA: Insufficient documentation

## 2017-01-03 DIAGNOSIS — J3089 Other allergic rhinitis: Secondary | ICD-10-CM

## 2017-01-03 DIAGNOSIS — I10 Essential (primary) hypertension: Secondary | ICD-10-CM

## 2017-01-03 DIAGNOSIS — E11620 Type 2 diabetes mellitus with diabetic dermatitis: Secondary | ICD-10-CM

## 2017-01-03 DIAGNOSIS — N201 Calculus of ureter: Secondary | ICD-10-CM

## 2017-01-03 DIAGNOSIS — E785 Hyperlipidemia, unspecified: Secondary | ICD-10-CM | POA: Diagnosis not present

## 2017-01-03 DIAGNOSIS — J302 Other seasonal allergic rhinitis: Secondary | ICD-10-CM

## 2017-01-03 DIAGNOSIS — G20 Parkinson's disease: Secondary | ICD-10-CM

## 2017-01-03 DIAGNOSIS — IMO0002 Reserved for concepts with insufficient information to code with codable children: Secondary | ICD-10-CM

## 2017-01-03 MED ORDER — CLOTRIMAZOLE-BETAMETHASONE 1-0.05 % EX CREA: 1.0000 "application " | TOPICAL_CREAM | Freq: Two times a day (BID) | CUTANEOUS | 2 refills | Status: DC

## 2017-01-03 MED FILL — CLOTRIMAZOLE-BETAMETHASONE: 1-0.05 | 15 days supply | Qty: 15 | Fill #0

## 2017-01-03 NOTE — Patient Instructions (Addendum)
Your A1C is a measure of your sugar over the past 3 months and is not affected by what you have eaten over the past few days. Diabetes increases your chances of stroke and heart attack over 300 % and is the leading cause of blindness and kidney failure in the [place]. Please make sure you decrease bad carbs like white bread, white rice, potatoes, corn, soft drinks, pasta, cereals, refined sugars, sweet tea, dried fruits, and fruit juice. Good carbs are okay to eat in moderation like sweet potatoes, brown rice, whole grain pasta/bread, most fruit (except dried fruit) and you can eat as many veggies as you want.   Greater than 6.5 is considered diabetic. Between 6.4 and 5.7 is prediabetic If your A1C is less than 5.7 you are NOT diabetic.  Targets for Glucose Readings: Time of Check Target for patients WITHOUT Diabetes Target for DIABETICS  Before Meals Less than 100  less than 150  Two hours after meals Less than 200  Less than 250    If your morning sugar is always below 100 then the issue is with your sugar spiking after meals. Try to take your blood sugar approximately 2 hours after eating, this number should be less than 200. If it is not, think about the foods that you ate and better choices you can make.   Somogyi effect The brain needs two things: oxygen and sugar. If the blood sugar level drops too low in the early morning hours, hormones (such as growth hormone, cortisol, and catecholamines) are released to make sure you brain can still function. These help reverse the low blood sugar level but may lead to blood sugar levels that are higher than normal in the morning. This is common for patient that take insulin at night or do not ear regular snacks.  Please schedule to get up in the middle of the night to check your blood sugar.   This may be happening to you. Please eat a high protein night time snack   GET ON ALLERGY PILL AND GET ON ZANTAC 150-300 AT NIGHT IF ANY CHEST PAIN OR  SHORTNESS OF BREATH GO TO ER  Common causes of cough OR hoarseness OR sore throat:   Allergies, Viral Infections, Acid Reflux and Bacterial Infections.  1) Allergies and viral infections cause a cough OR sore throat by post nasal drip and are often worse at night, can also have sneezing, lower grade fevers, clear/yellow mucus. This is best treated with allergy medications or nasal sprays.  Please get on allegra for 1-2 weeks The strongest is allegra or fexafinadine  Cheapest at [employer], [employer], [employer]  2) Bacterial infections are more severe than allergies or viral infections with fever, teeth pain, fatigue. This can be treated with prednisone and the same over the counter medication and after 7 days can be treated with an antibiotic.   3) Silent reflux/GERD can cause a cough OR sore throat OR hoarseness WITHOUT heart burn because the esophagus that goes to the stomach and trachea that goes to the lungs are very close and when you lay down the acid can irritate your throat and lungs. This can cause hoarseness, cough, and wheezing. Please stop any alcohol or anti-inflammatories like aleve/advil/ibuprofen and start an over the counter Prilosec or omeprazole 1-2 times daily 8mins before food for 2 weeks, then switch to over the counter zantac/ratinidine or pepcid/famotadine once at night for 2 weeks.   4) sometimes irritation causes more irritation. Try voice rest, use sugar  free cough drops to prevent coughing, and try to stop clearing your throat.   If you ever have a cough that does not go away after trying these things please make a follow up visit for further evaluation or we can refer you to a specialist. Or if you ever have shortness of breath or chest pain go to the ER.    Drink 80-100 oz a day of water, measure it out Eat 3 meals a day, have to do breakfast, eat protein- hard boiled eggs, protein bar like [place] protein bar, greek yogurt like oikos triple zero, chobani 100, or light  n fit greek  We want weight loss that will last so you should lose 1-2 pounds a week.  THAT IS IT! Please pick THREE things a month to change. Once it is a habit check off the item. Then pick another three items off the list to become habits.  If you are already doing a habit on the list GREAT!  Cross that item off! o Don't drink your calories. Ie, alcohol, soda, fruit juice, and sweet tea.  o Drink more water. Drink a glass when you feel hungry or before each meal.  o Eat breakfast - Complex carb and protein (likeDannon light and fit yogurt, oatmeal, fruit, eggs, [place] bacon). o Measure your cereal.  Eat no more than one cup a day. (ie [place]) o Eat an apple a day. o Add a vegetable a day. o Try a new vegetable a month. o Use Pam! Stop using oil or butter to cook. o Don't finish your plate or use smaller plates. o Share your dessert. o Eat sugar free Jello for dessert or frozen grapes. o Don't eat 2-3 hours before bed. o Switch to whole wheat bread, pasta, and brown rice. o Make healthier choices when you eat out. No fries! o Pick baked chicken, NOT fried. o Don't forget to SLOW DOWN when you eat. It is not going anywhere.  o Take the stairs. o Park far away in the parking lot o [employer] (or weights) for 10 minutes while watching TV. o Walk at work for 10 minutes during break. o Walk outside 1 time a week with your friend, kids, dog, or significant other. o Start a walking group at [place] the mall as much as you can tolerate.  o Keep a food diary. o Weigh yourself daily. o Walk for 15 minutes 3 days per week. o Cook at home more often and eat out less.  If life happens and you go back to old habits, it is okay.  Just start over. You can do it!   If you experience chest pain, get short of breath, or tired during the exercise, please stop immediately and inform your doctor.    Simple math prevails.    1st - exercise does not produce significant weight loss - at best  one converts fat into muscle , "bulks up", loses inches, but usually stays "weight neutral"     2nd - think of your body weightas a check book: If you eat more calories than you burn up - you save money or gain weight .... Or if you spend more money than you put in the check book, ie burn up more calories than you eat, then you lose weight     3rd - if you walk or run 1 mile, you burn up 100 calories - you have to burn up 3,500 calories to lose 1 pound,  ie you have to walk/run 35 miles to lose 1 measly pound. So if you want to lose 10 #, then you have to walk/run 350 miles, so.... clearly exercise is not the solution.     4. So if you consume 1,500 calories, then you have to burn up the equivalent of 15 miles to stay weight neutral - It also stands to reason that if you consume 1,500 cal/day and don't lose weight, then you must be burning up about 1,500 cals/day to stay weight neutral.     5. If you really want to lose weight, you must cut your calorie intake 300 calories /day and at that rate you should lose about 1 # every 3 days.   6. Please purchase Dr Fara Olden Fuhrman's book(s) "The End of Dieting" & "Eat to Live" . It has some great concepts and recipes.

## 2017-01-04 ENCOUNTER — Other Ambulatory Visit: Payer: Self-pay | Admitting: Physician Assistant

## 2017-01-04 ENCOUNTER — Other Ambulatory Visit: Payer: Self-pay | Admitting: Internal Medicine

## 2017-01-04 ENCOUNTER — Encounter: Payer: Self-pay | Admitting: *Deleted

## 2017-01-04 MED ORDER — PREDNISONE 20 MG PO TABS: ORAL_TABLET | ORAL | 0 refills | Status: DC

## 2017-01-04 MED ORDER — CIPROFLOXACIN HCL 500 MG PO TABS: 500.0000 mg | ORAL_TABLET | Freq: Two times a day (BID) | ORAL | 0 refills | Status: DC

## 2017-01-04 MED FILL — CIPROFLOXACIN HCL 500 MG TA: 500 | 7 days supply | Qty: 14 | Fill #0

## 2017-01-04 NOTE — Progress Notes (Signed)
LVM for pt to return office call for LAB results.

## 2017-01-06 LAB — URINE CULTURE
MICRO NUMBER: 80958696
SPECIMEN QUALITY:: ADEQUATE

## 2017-01-06 LAB — HEPATIC FUNCTION PANEL
AG RATIO: 1.4 (calc) (ref 1.0–2.5)
ALT: 8 U/L — ABNORMAL LOW (ref 9–46)
AST: 16 U/L (ref 10–35)
Albumin: 4.2 g/dL (ref 3.6–5.1)
Alkaline phosphatase (APISO): 51 U/L (ref 40–115)
BILIRUBIN DIRECT: 0.1 mg/dL (ref 0.0–0.2)
BILIRUBIN INDIRECT: 0.4 mg/dL (ref 0.2–1.2)
GLOBULIN: 3.1 g/dL (ref 1.9–3.7)
Total Bilirubin: 0.5 mg/dL (ref 0.2–1.2)
Total Protein: 7.3 g/dL (ref 6.1–8.1)

## 2017-01-06 LAB — URINALYSIS, ROUTINE W REFLEX MICROSCOPIC
Bilirubin Urine: NEGATIVE
Glucose, UA: NEGATIVE
HGB URINE DIPSTICK: NEGATIVE
HYALINE CAST: NONE SEEN /LPF
KETONES UR: NEGATIVE
Nitrite: POSITIVE — AB
PROTEIN: NEGATIVE
RBC / HPF: NONE SEEN /HPF (ref 0–2)
Specific Gravity, Urine: 1.016 (ref 1.001–1.03)
Squamous Epithelial / LPF: NONE SEEN /HPF (ref <=5)
pH: 5.5 (ref 5.0–8.0)

## 2017-01-06 LAB — CBC WITH DIFFERENTIAL/PLATELET
BASOS PCT: 0.5 %
Basophils Absolute: 43 cells/uL (ref 0–200)
EOS ABS: 111 {cells}/uL (ref 15–500)
Eosinophils Relative: 1.3 %
HCT: 44.6 % (ref 38.5–50.0)
HEMOGLOBIN: 15.1 g/dL (ref 13.2–17.1)
Lymphs Abs: 2142 cells/uL (ref 850–3900)
MCH: 27.6 pg (ref 27.0–33.0)
MCHC: 33.9 g/dL (ref 32.0–36.0)
MCV: 81.4 fL (ref 80.0–100.0)
MPV: 11.1 fL (ref 7.5–12.5)
Monocytes Relative: 8.3 %
NEUTROS ABS: 5500 {cells}/uL (ref 1500–7800)
Neutrophils Relative %: 64.7 %
Platelets: 185 10*3/uL (ref 140–400)
RBC: 5.48 10*6/uL (ref 4.20–5.80)
RDW: 14 % (ref 11.0–15.0)
TOTAL LYMPHOCYTE: 25.2 %
WBC: 8.5 10*3/uL (ref 3.8–10.8)
WBCMIX: 706 {cells}/uL (ref 200–950)

## 2017-01-06 LAB — LIPID PANEL
CHOL/HDL RATIO: 4.7 (calc) (ref <5.0)
CHOLESTEROL: 154 mg/dL (ref <200)
HDL: 33 mg/dL — ABNORMAL LOW (ref >40)
LDL CHOLESTEROL (CALC): 83 mg/dL
Non-HDL Cholesterol (Calc): 121 mg/dL (calc) (ref <130)
Triglycerides: 314 mg/dL — ABNORMAL HIGH (ref <150)

## 2017-01-06 LAB — BASIC METABOLIC PANEL WITH GFR
BUN / CREAT RATIO: 16 (calc) (ref 6–22)
BUN: 22 mg/dL (ref 7–25)
CO2: 26 mmol/L (ref 20–32)
CREATININE: 1.35 mg/dL — AB (ref 0.70–1.25)
Calcium: 10 mg/dL (ref 8.6–10.3)
Chloride: 99 mmol/L (ref 98–110)
GFR, EST AFRICAN AMERICAN: 65 mL/min/{1.73_m2} (ref >=60)
GFR, Est Non African American: 56 mL/min/{1.73_m2} — ABNORMAL LOW (ref >=60)
Glucose, Bld: 150 mg/dL — ABNORMAL HIGH (ref 65–99)
Potassium: 3.6 mmol/L (ref 3.5–5.3)
Sodium: 137 mmol/L (ref 135–146)

## 2017-01-06 LAB — VITAMIN B12: VITAMIN B 12: 570 pg/mL (ref 200–1100)

## 2017-01-06 LAB — HEMOGLOBIN A1C
HEMOGLOBIN A1C: 9.8 %{Hb} — AB (ref <5.7)
MEAN PLASMA GLUCOSE: 235 (calc)
eAG (mmol/L): 13 (calc)

## 2017-01-06 LAB — IRON, TOTAL/TOTAL IRON BINDING CAP
%SAT: 17 % (calc) (ref 15–60)
Iron: 61 ug/dL (ref 50–180)
TIBC: 355 mcg/dL (calc) (ref 250–425)

## 2017-01-06 LAB — MAGNESIUM: Magnesium: 1.6 mg/dL (ref 1.5–2.5)

## 2017-01-06 LAB — MICROALBUMIN / CREATININE URINE RATIO
Creatinine, Urine: 128 mg/dL (ref 20–370)
MICROALB/CREAT RATIO: 3 ug/mg{creat} (ref <30)
Microalb, Ur: 0.4 mg/dL

## 2017-01-06 LAB — PSA: PSA: 2.2 ng/mL (ref <=4.0)

## 2017-01-06 LAB — TSH: TSH: 3.66 mIU/L (ref 0.40–4.50)

## 2017-01-06 LAB — VITAMIN D 25 HYDROXY (VIT D DEFICIENCY, FRACTURES): Vit D, 25-Hydroxy: 29 ng/mL — ABNORMAL LOW (ref 30–100)

## 2017-01-08 ENCOUNTER — Other Ambulatory Visit: Payer: Self-pay | Admitting: Physician Assistant

## 2017-01-09 MED FILL — VALSARTAN-HCTZ 320-25 MG TA: 320-25 | 30 days supply | Qty: 30 | Fill #2

## 2017-01-09 MED FILL — CARBIDOPA-LEVODOPA 25-100 T: 25-100 | 90 days supply | Qty: 270 | Fill #1

## 2017-01-11 MED FILL — JANUVIA 100 MG TABLET: 100 | 30 days supply | Qty: 30 | Fill #2

## 2017-01-15 DIAGNOSIS — G4733 Obstructive sleep apnea (adult) (pediatric): Secondary | ICD-10-CM | POA: Diagnosis not present

## 2017-01-16 ENCOUNTER — Ambulatory Visit (INDEPENDENT_AMBULATORY_CARE_PROVIDER_SITE_OTHER): Payer: 59 | Admitting: Adult Health

## 2017-01-16 ENCOUNTER — Encounter: Payer: Self-pay | Admitting: Adult Health

## 2017-01-16 VITALS — BP 130/86 | HR 81 | Temp 98.1°F | Resp 16 | Ht 67.0 in | Wt 288.2 lb

## 2017-01-16 DIAGNOSIS — S39012A Strain of muscle, fascia and tendon of lower back, initial encounter: Secondary | ICD-10-CM

## 2017-01-16 DIAGNOSIS — Z2239 Carrier of other specified bacterial diseases: Secondary | ICD-10-CM | POA: Diagnosis not present

## 2017-01-16 MED ORDER — METHOCARBAMOL 500 MG PO TABS: 500.0000 mg | ORAL_TABLET | Freq: Three times a day (TID) | ORAL | 0 refills | Status: DC | PRN

## 2017-01-16 MED ORDER — VITAMIN C 500 MG PO TABS
500.0000 mg | ORAL_TABLET | Freq: Three times a day (TID) | ORAL | 0 refills | Status: DC
Start: 2017-01-16 — End: 2021-11-30

## 2017-01-16 MED ORDER — MELOXICAM 15 MG PO TABS
ORAL_TABLET | ORAL | 1 refills | Status: DC
Start: 2017-01-16 — End: 2018-05-17

## 2017-01-16 MED FILL — METHOCARBAMOL 500 MG TABLET: 500 | 8 days supply | Qty: 24 | Fill #0

## 2017-01-16 MED FILL — MELOXICAM 15 MG TABLET: 15 | 30 days supply | Qty: 30 | Fill #0

## 2017-01-16 NOTE — Progress Notes (Signed)
Assessment & Plan:  Todd Rice was seen today for back pain.  Diagnoses and all orders for this visit:  Strain of back, initial encounter -     methocarbamol (ROBAXIN) 500 MG tablet; Take 1 tablet (500 mg total) by mouth every 8 (eight) hours as needed for muscle spasms. -     meloxicam (MOBIC) 15 MG tablet; Take one daily with food for 2 weeks, can take with tylenol, can not take with aleve, iburpofen, then as needed daily for pain -      Apply heat 2-3 times a day for 20 minutes as tolerated -      Provided back exercise information   Pseudomonas aeruginosa colonization -     vitamin C (ASCORBIC ACID) 500 MG tablet; Take 1 tablet (500 mg total) by mouth 3 (three) times daily. -     We will follow up at the next visit in 1 month to recheck urine.     Further disposition pending results of labs. Discussed med's effects and SE's.   Over 15 minutes of exam, counseling, chart review, and critical decision making was performed.   Future Appointments Date Time Provider [place]  02/15/2017 8:00 AM Rine, Selmer Dominion, OT [facility]  02/15/2017 8:15 AM Sharen Counter, CCC-SLP [facility]  02/15/2017 8:30 AM Frazier Butt, PT [facility]  02/28/2017 4:30 PM Vicie Mutters, PA-C [facility] None  04/12/2017 10:30 AM Vicie Mutters, PA-C [facility] None  05/14/2017 8:30 AM Kathrynn Ducking, MD [facility] None  06/27/2017 2:30 PM Ward Givens, NP [facility] None  01/08/2018 2:00 PM Vicie Mutters, PA-C [facility] None     Subjective:    Patient ID: Todd Rice, male    DOB: 1955-11-05, 61 y.o.   MRN: [id number]  HPI  BP 130/86   Pulse 81   Temp 98.1 F (36.7 C)   Resp 16   Ht 5\' 7"  (1.702 m)   Wt 288 lb 3.2 oz (130.7 kg)   SpO2 97%   BMI 45.14 kg/m   61 y/o male presents c/o right-sided mid back pain x 4 days, 7/10 with movement, some radiation to upper back, experience a single episode of tingling in right buttock. Does not radiate across to left side. Denies  weakness, numbness, tingling in lower extremities. Denies urinary changes: urgency, frequency, dysuria, incontinence, changes in urine character. States this does not feel like "kidney"- patient has a history of right sided renal calculus.   Mechanism of injury: twisted while getting out of bed Aggravating/improving: tried heat, ice, biofreeze, robaxin from previous injury, with some improvement. Pain is worse with movement.   Pseudomonas aeruginosa colonization of the urine was noted on 01/03/2017; he was prescribed Cipro 500 mg BID x 7 days; he experienced an allergic reaction and did not complete.   PMH: has Ureteral calculus, right; CAD (coronary artery disease); Hypertension; Hyperlipidemia; Class 3 obesity due to excess calories with serious comorbidity and body mass index (BMI) of 45.0 to 49.9 in adult [facility]); Hemorrhage of rectum and anus; Pseudomonas aeruginosa colonization; Seasonal and perennial allergic rhinitis; Asthma, mild intermittent, well-controlled; Parkinson disease ([place]); Uncontrolled secondary diabetes mellitus with stage 3 CKD (GFR 30-59) ([place]); Medication management; Vitamin D deficiency; REM sleep behavior disorder; and OSA treated with BiPAP on his problem list.  Current Outpatient Prescriptions on File Prior to Visit  Medication Sig Dispense Refill  . allopurinol (ZYLOPRIM) 300 MG tablet TAKE 1 TABLET BY MOUTH DAILY. 90 tablet 2  . ALPRAZolam (XANAX) 0.5 MG tablet  TAKE 1 TABLET BY MOUTH THREE TIMES DAILY 90 tablet 2  . aspirin EC 81 MG tablet Take 81 mg by mouth at bedtime.    . ciprofloxacin (CIPRO) 500 MG tablet Take 1 tablet (500 mg total) by mouth 2 (two) times daily. 14 tablet 0  . clotrimazole-betamethasone (LOTRISONE) cream Apply 1 application topically 2 (two) times daily. 15 g 2  . COLCRYS 0.6 MG tablet Take 1 tablet (0.6 mg total) by mouth 2 (two) times daily. 180 tablet 12  . CRESTOR 10 MG tablet Take 1 tablet (10 mg total) by mouth at bedtime. 90 tablet 3  .  EPINEPHrine (EPIPEN 2-PAK) 0.3 mg/0.3 mL IJ SOAJ injection Inject 0.3 mLs (0.3 mg total) into the muscle once. 2 Device 1  . methocarbamol (ROBAXIN) 500 MG tablet Take 1 tablet (500 mg total) by mouth every 8 (eight) hours as needed for muscle spasms. 24 tablet 0  . pramipexole (MIRAPEX) 0.5 MG tablet Take 1 tablet (0.5 mg total) by mouth 3 (three) times daily. 270 tablet 2  . predniSONE (DELTASONE) 20 MG tablet 1 tab 3 x day for 2 days, then 1 tab 2 x day for 2 days, then 1 tab 1 x day for 3 days 13 tablet 0  . Probiotic Product (PROBIOTIC DAILY PO) Take 1 tablet by mouth daily.    . rosuvastatin (CRESTOR) 10 MG tablet Take 1 tablet (10 mg total) by mouth daily. 30 tablet 2  . sitaGLIPtin (JANUVIA) 100 MG tablet Take 1 tablet daily for Diabetes 90 tablet 1  . valsartan-hydrochlorothiazide (DIOVAN-HCT) 320-25 MG tablet TAKE 1 TABLET BY MOUTH ONCE DAILY 90 tablet 1  . VENTOLIN HFA 108 (90 Base) MCG/ACT inhaler INHALE 2 PUFFS INTO THE LUNGS EVERY 6 HOURS AS NEEDED FOR WHEEZING OR SHORTNESS OF BREATH. 54 g 3  . albuterol (PROVENTIL HFA;VENTOLIN HFA) 108 (90 BASE) MCG/ACT inhaler Take 1 to 2 inhalations 4 x daily or every 4 hours if need to rescue asthma 18 g 99  . metFORMIN (GLUCOPHAGE XR) 500 MG 24 hr tablet Take 2 tablets 2 x / day for Diabetes 360 tablet 1   No current facility-administered medications on file prior to visit.      Allergies  Allergen Reactions  . Ciprofibrate Hives and Rash  . Ceftriaxone Hives   BMI is Body mass index is 45.14 kg/m., he is working on diet and exercise. Wt Readings from Last 3 Encounters:  01/16/17 288 lb 3.2 oz (130.7 kg)  01/03/17 292 lb 12.8 oz (132.8 kg)  12/06/16 295 lb 8 oz (134 kg)     Review of Systems  Constitutional: Negative for appetite change, chills, diaphoresis and fever.  HENT: Negative.   Eyes: Negative.   Respiratory: Negative for chest tightness, shortness of breath and wheezing.   Cardiovascular: Negative for chest pain and  palpitations.  Gastrointestinal: Negative for abdominal pain, constipation, diarrhea, nausea and vomiting.  Endocrine: Negative for polydipsia, polyphagia and polyuria.  Genitourinary: Negative for difficulty urinating, dysuria, flank pain, frequency, hematuria and urgency.  Musculoskeletal: Positive for back pain (Right mid back with intermittent radiation to upper back. ). Negative for gait problem, joint swelling and myalgias.  Skin: Negative.   Neurological: Negative for dizziness, weakness, light-headedness and numbness.  Psychiatric/Behavioral: Negative.        Objective:   Physical Exam  Constitutional: He appears well-developed and well-nourished. No distress.  Morbidly obese  HENT:  Head: Normocephalic.  Right Ear: External ear normal.  Left Ear: External ear normal.  Mouth/Throat: Oropharynx is clear and moist. No oropharyngeal exudate.  Eyes: Pupils are equal, round, and reactive to light.  Neck: Neck supple. No thyromegaly present.  Cardiovascular: Normal rate and regular rhythm.   Pulmonary/Chest: Effort normal and breath sounds normal. No respiratory distress. He has no wheezes. He has no rales.  Abdominal: Soft. Bowel sounds are normal. He exhibits no distension. There is no tenderness. There is no CVA tenderness.  Musculoskeletal: Normal range of motion.       Arms: Patient slow to rise from chair due pain/parkinson,pinpoint muscular tenderness right mid back, worse with twisting/movement, no rash, neg straight leg raise, good distal neurovascular exam.   Lymphadenopathy:    He has no cervical adenopathy.  Skin: Skin is dry. He is not diaphoretic.

## 2017-01-16 NOTE — Patient Instructions (Addendum)
Take vitamin C 500 mg three times a day for a week, then we will have you come back just to recheck your urine to see if it has cleared your urine.   Try the exercises and other information in the back care manual, meloxicam once during the day as needed (avoid taking other NSAIDS like Alleve or Ibuprofen while taking this) and then robaxin if needed at bedtime for muscle spasm. This can be taken up to every 8 hours, but causes sedation, so should not drive or operate heavy machinery while taking this medicine.   Go to the ER if you have any new weakness in your legs, have trouble controlling your urine or bowels, or have worsening pain.   If you are not better in 1-3 month we will refer you to ortho   Back pain Rehab Ask your health care provider which exercises are safe for you. Do exercises exactly as told by your health care provider and adjust them as directed. It is normal to feel mild stretching, pulling, tightness, or discomfort as you do these exercises, but you should stop right away if you feel sudden pain or your pain gets worse.Do not begin these exercises until told by your health care provider. Stretching and range of motion exercises These exercises warm up your muscles and joints and improve the movement and flexibility of your hips and your back. These exercises also help to relieve pain, numbness, and tingling. Exercise A: Sciatic nerve glide 1. Sit in a chair with your head facing down toward your chest. Place your hands behind your back. Let your shoulders slump forward. 2. Slowly straighten one of your knees while you tilt your head back as if you are looking toward the ceiling. Only straighten your leg as far as you can without making your symptoms worse. 3. Hold for __________ seconds. 4. Slowly return to the starting position. 5. Repeat with your other leg. Repeat __________ times. Complete this exercise __________ times a day. Exercise B: Knee to chest with hip  adduction and internal rotation  1. Lie on your back on a firm surface with both legs straight. 2. Bend one of your knees and move it up toward your chest until you feel a gentle stretch in your lower back and buttock. Then, move your knee toward the shoulder that is on the opposite side from your leg. ? Hold your leg in this position by holding onto the front of your knee. 3. Hold for __________ seconds. 4. Slowly return to the starting position. 5. Repeat with your other leg. Repeat __________ times. Complete this exercise __________ times a day. Exercise C: Prone extension on elbows  1. Lie on your abdomen on a firm surface. A bed may be too soft for this exercise. 2. Prop yourself up on your elbows. 3. Use your arms to help lift your chest up until you feel a gentle stretch in your abdomen and your lower back. ? This will place some of your body weight on your elbows. If this is uncomfortable, try stacking pillows under your chest. ? Your hips should stay down, against the surface that you are lying on. Keep your hip and back muscles relaxed. 4. Hold for __________ seconds. 5. Slowly relax your upper body and return to the starting position. Repeat __________ times. Complete this exercise __________ times a day. Strengthening exercises These exercises build strength and endurance in your back. Endurance is the ability to use your muscles for a long time, even  after they get tired. Exercise D: Pelvic tilt 1. Lie on your back on a firm surface. Bend your knees and keep your feet flat. 2. Tense your abdominal muscles. Tip your pelvis up toward the ceiling and flatten your lower back into the floor. ? To help with this exercise, you may place a small towel under your lower back and try to push your back into the towel. 3. Hold for __________ seconds. 4. Let your muscles relax completely before you repeat this exercise. Repeat __________ times. Complete this exercise __________ times a  day. Exercise E: Alternating arm and leg raises  1. Get on your hands and knees on a firm surface. If you are on a hard floor, you may want to use padding to cushion your knees, such as an exercise mat. 2. Line up your arms and legs. Your hands should be below your shoulders, and your knees should be below your hips. 3. Lift your left leg behind you. At the same time, raise your right arm and straighten it in front of you. ? Do not lift your leg higher than your hip. ? Do not lift your arm higher than your shoulder. ? Keep your abdominal and back muscles tight. ? Keep your hips facing the ground. ? Do not arch your back. ? Keep your balance carefully, and do not hold your breath. 4. Hold for __________ seconds. 5. Slowly return to the starting position and repeat with your right leg and your left arm. Repeat __________ times. Complete this exercise __________ times a day. Posture and body mechanics  Body mechanics refers to the movements and positions of your body while you do your daily activities. Posture is part of body mechanics. Good posture and healthy body mechanics can help to relieve stress in your body's tissues and joints. Good posture means that your spine is in its natural S-curve position (your spine is neutral), your shoulders are pulled back slightly, and your head is not tipped forward. The following are general guidelines for applying improved posture and body mechanics to your everyday activities. Standing   When standing, keep your spine neutral and your feet about hip-width apart. Keep a slight bend in your knees. Your ears, shoulders, and hips should line up.  When you do a task in which you stand in one place for a long time, place one foot up on a stable object that is 2-4 inches (5-10 cm) high, such as a footstool. This helps keep your spine neutral. Sitting   When sitting, keep your spine neutral and keep your feet flat on the floor. Use a footrest, if necessary,  and keep your thighs parallel to the floor. Avoid rounding your shoulders, and avoid tilting your head forward.  When working at a desk or a computer, keep your desk at a height where your hands are slightly lower than your elbows. Slide your chair under your desk so you are close enough to maintain good posture.  When working at a computer, place your monitor at a height where you are looking straight ahead and you do not have to tilt your head forward or downward to look at the screen. Resting   When lying down and resting, avoid positions that are most painful for you.  If you have pain with activities such as sitting, bending, stooping, or squatting (flexion-based activities), lie in a position in which your body does not bend very much. For example, avoid curling up on your side with your arms and  knees near your chest (fetal position).  If you have pain with activities such as standing for a long time or reaching with your arms (extension-based activities), lie with your spine in a neutral position and bend your knees slightly. Try the following positions: ? Lying on your side with a pillow between your knees. ? Lying on your back with a pillow under your knees. Lifting   When lifting objects, keep your feet at least shoulder-width apart and tighten your abdominal muscles.  Bend your knees and hips and keep your spine neutral. It is important to lift using the strength of your legs, not your back. Do not lock your knees straight out.  Always ask for help to lift heavy or awkward objects. This information is not intended to replace advice given to you by your health care provider. Make sure you discuss any questions you have with your health care provider. Document Released: 04/24/2005 Document Revised: 12/30/2015 Document Reviewed: 01/08/2015 Elsevier Interactive Patient Education  2018 [place] A1C is a measure of your sugar over the past 3 months and is not affected by  what you have eaten over the past few days. Diabetes increases your chances of stroke and heart attack over 300 % and is the leading cause of blindness and kidney failure in the [place]. Please make sure you decrease bad carbs like white bread, white rice, potatoes, corn, soft drinks, pasta, cereals, refined sugars, sweet tea, dried fruits, and fruit juice. Good carbs are okay to eat in moderation like sweet potatoes, brown rice, whole grain pasta/bread, most fruit (except dried fruit) and you can eat as many veggies as you want.   Greater than 6.5 is considered diabetic. Between 6.4 and 5.7 is prediabetic If your A1C is less than 5.7 you are NOT diabetic.  Targets for Glucose Readings: Time of Check Target for patients WITHOUT Diabetes Target for DIABETICS  Before Meals Less than 100  less than 150  Two hours after meals Less than 200  Less than 250

## 2017-02-11 MED FILL — VALSARTAN-HCTZ 320-25 MG TA: 320-25 | 30 days supply | Qty: 30 | Fill #3

## 2017-02-11 MED FILL — ALPRAZolam 0.5 MG TABS: 0.5 | 30 days supply | Qty: 90 | Fill #2

## 2017-02-12 MED FILL — JANUVIA 100 MG TABLET: 100 | 30 days supply | Qty: 30 | Fill #3

## 2017-02-15 ENCOUNTER — Ambulatory Visit: Payer: 59 | Admitting: Occupational Therapy

## 2017-02-15 ENCOUNTER — Ambulatory Visit: Payer: 59 | Attending: Internal Medicine

## 2017-02-15 ENCOUNTER — Ambulatory Visit: Payer: 59 | Admitting: Physical Therapy

## 2017-02-15 DIAGNOSIS — R471 Dysarthria and anarthria: Secondary | ICD-10-CM | POA: Insufficient documentation

## 2017-02-15 DIAGNOSIS — R2689 Other abnormalities of gait and mobility: Secondary | ICD-10-CM

## 2017-02-15 DIAGNOSIS — R278 Other lack of coordination: Secondary | ICD-10-CM | POA: Insufficient documentation

## 2017-02-15 NOTE — Therapy (Addendum)
[facility] [address] [place], [place], [address] Phone: [phone]   Fax:  [phone]  Patient Details  Name: Todd Rice MRN: [id number] Date of Birth: [birth year] Referring Provider:  Neldon Newport., MD  Encounter Date: 02/15/2017  Speech Therapy Parkinson's Disease Screen   Decibel Level today: upper 60s dB  (WNL=70-72 dB) overall, with sound level meter 30cm away from pt's mouth. Initially pt maintained WNL volume but over 5 minutes volume declined. Pt's conversational volume has decr'd slightly since last treatment course.  Pt reports coughing with liquids, rarely.  A speech therapy eval for dysarthria is recommended. Please order via EPIC if agreed.   [facility] 02/15/2017, 11:16 AM  [facility] [address] [place], [place], [address] Phone: [phone]   Fax:  [phone]

## 2017-02-15 NOTE — Therapy (Signed)
[facility] [address] [place], [place], [address] Phone: [phone]   Fax:  [phone]  Patient Details  Name: Todd Rice MRN: [id number] Date of Birth: 27-Feb-1956 Referring Provider:  Unk Pinto, MD  Encounter Date: 02/15/2017  Occupational Therapy Parkinson's Disease Screen  Hand dominance:  right   Fastening/unfastening 3 buttons in:  34.56sec  9-hole peg test:    RUE  43.94 sec        LUE  31.56 sec  Box & Blocks Test:   RUE  44 blocks         Change in ability to perform ADLs/IADLs:  Switches to nondominant hand more.  Other Comments:  Has not performing HEP consistently.  Pt would benefit from occupational therapy evaluation due to  Decline in fine motor skills, incr difficulty with R hand functional use.    [facility] 02/15/2017, 8:06 AM  [facility] [address] [place], [place], [address] Phone: [phone]   Fax:  [place], OTR/L [facility] [address] [place], [place]  [address] [phone] phone [phone] 02/15/17 8:06 AM

## 2017-02-15 NOTE — Therapy (Signed)
[facility] [address] [place], [place], [address] Phone: [phone]   Fax:  [phone]  Patient Details  Name: Todd Rice MRN: [id number] Date of Birth: 10/08/1955 Referring Provider:  Unk Pinto, MD  Encounter Date: 02/15/2017  Physical Therapy Parkinson's Disease Screen   Timed Up and Go test: 11.71 sec  10 meter walk test:9.15 sec = 3.58 ft/sec  5 time sit to stand test:13.82 sec   Patient would benefit from Physical Therapy evaluation due to slowed mobility measures with TUG and 5x sit<>stand.  Pt is not currently exercising consistently other than walking his dog.       MARRIOTT,AMY W. 02/15/2017, 8:32 AM  Frazier Butt., PT   [facility] [address] [place], [place], [address] Phone: [phone]   Fax:  [phone]

## 2017-02-22 ENCOUNTER — Ambulatory Visit (INDEPENDENT_AMBULATORY_CARE_PROVIDER_SITE_OTHER): Payer: 59 | Admitting: *Deleted

## 2017-02-22 DIAGNOSIS — Z23 Encounter for immunization: Secondary | ICD-10-CM

## 2017-02-28 ENCOUNTER — Encounter: Payer: Self-pay | Admitting: Physician Assistant

## 2017-02-28 ENCOUNTER — Ambulatory Visit (INDEPENDENT_AMBULATORY_CARE_PROVIDER_SITE_OTHER): Payer: 59 | Admitting: Physician Assistant

## 2017-02-28 VITALS — BP 124/78 | HR 75 | Temp 97.5°F | Resp 16 | Ht 67.0 in | Wt 292.6 lb

## 2017-02-28 DIAGNOSIS — IMO0002 Reserved for concepts with insufficient information to code with codable children: Secondary | ICD-10-CM

## 2017-02-28 DIAGNOSIS — E1365 Other specified diabetes mellitus with hyperglycemia: Secondary | ICD-10-CM | POA: Diagnosis not present

## 2017-02-28 DIAGNOSIS — G20 Parkinson's disease: Secondary | ICD-10-CM

## 2017-02-28 DIAGNOSIS — E1322 Other specified diabetes mellitus with diabetic chronic kidney disease: Secondary | ICD-10-CM | POA: Diagnosis not present

## 2017-02-28 DIAGNOSIS — N183 Chronic kidney disease, stage 3 (moderate): Secondary | ICD-10-CM

## 2017-02-28 NOTE — Patient Instructions (Addendum)
Call insurance and find out what strip they cover Or can get [employer] meter free and buy their strips.  Continue to drink about 5 bottle of the water a day, can go up to 6 a day if you can  Try to do lunch such as protein shakes Or try [place] protein bars for lunch   Your A1C is a measure of your sugar over the past 3 months and is not affected by what you have eaten over the past few days. Diabetes increases your chances of stroke and heart attack over 300 % and is the leading cause of blindness and kidney failure in the [place]. Please make sure you decrease bad carbs like white bread, white rice, potatoes, corn, soft drinks, pasta, cereals, refined sugars, sweet tea, dried fruits, and fruit juice. Good carbs are okay to eat in moderation like sweet potatoes, brown rice, whole grain pasta/bread, most fruit (except dried fruit) and you can eat as many veggies as you want.   Greater than 6.5 is considered diabetic. Between 6.4 and 5.7 is prediabetic If your A1C is less than 5.7 you are NOT diabetic.  Goal is below 8  Targets for Glucose Readings: Time of Check Target for patients WITHOUT Diabetes Target for DIABETICS  Before Meals Less than 100  less than 150  Two hours after meals Less than 200  Less than 250       Bad carbs also include fruit juice, alcohol, and sweet tea. These are empty calories that do not signal to your brain that you are full.   Please remember the good carbs are still carbs which convert into sugar. So please measure them out no more than 1/2-1 cup of rice, oatmeal, pasta, and beans  Veggies are however free foods! Pile them on.   Not all fruit is created equal. Please see the list below, the fruit at the bottom is higher in sugars than the fruit at the top. Please avoid all dried fruits.

## 2017-02-28 NOTE — Progress Notes (Signed)
Diabetes Education and Follow-Up Visit  61 y.o.male presents for diabetic education. He has Diabetes Mellitus type 2:  with diabetic chronic kidney disease, he is on bASA, and denies paresthesia of the feet, polydipsia, polyuria and visual disturbances, states swelling and numbness in feet are better. He is eating smaller portions, less sweets.   Last hemoglobin A1c was: Lab Results  Component Value Date   HGBA1C 9.8 (H) 01/03/2017   HGBA1C 10.4 (H) 10/24/2016   HGBA1C 8.5 (H) 07/13/2016   BMI is Body mass index is 45.83 kg/m., he is working on diet and exercise. Wt Readings from Last 3 Encounters:  02/28/17 292 lb 9.6 oz (132.7 kg)  01/16/17 288 lb 3.2 oz (130.7 kg)  01/03/17 292 lb 12.8 oz (132.8 kg)   Pt is on a regimen of: Metformin 500mg  2 AM and 2 PM Januiva 100  Breakfast is 2 eggs, wheat toast, occ bacon Lunch no lunch- sometimes crackers Dinner some protein and veggie.   Pt checks his sugars 2 x day  Lowest sugar was 70.   He has hypoglycemia awareness.  Highest sugar was 173  Glucometer: wants to get new meter, strips are expensive Eye doctor Dr. Sherral Hammers 09/04/2016 no retinopathy  Exercise: walks dog around block 2 x a day, going to therapy for parkinson's  Patient does have CKD He is on ACE/ARB Lab Results  Component Value Date   GFRNONAA 56 (L) 01/03/2017    Lab Results  Component Value Date   CREATININE 1.35 (H) 01/03/2017   BUN 22 01/03/2017   NA 137 01/03/2017   K 3.6 01/03/2017   CL 99 01/03/2017   CO2 26 01/03/2017    Lab Results  Component Value Date   MICROALBUR 0.4 01/03/2017     He is on a Statin.  He is not at goal of less than 70.  Lab Results  Component Value Date   CHOL 154 01/03/2017   HDL 33 (L) 01/03/2017   LDLCALC NOT CALC 10/24/2016   TRIG 314 (H) 01/03/2017   CHOLHDL 4.7 01/03/2017     Problem List has Ureteral calculus, right; CAD (coronary artery disease); Hypertension; Hyperlipidemia; Class 3 obesity due to excess  calories with serious comorbidity and body mass index (BMI) of 45.0 to 49.9 in adult; Hemorrhage of rectum and anus; Pseudomonas aeruginosa colonization; Seasonal and perennial allergic rhinitis; Asthma, mild intermittent, well-controlled; Parkinson disease ([place]); Uncontrolled secondary diabetes mellitus with stage 3 CKD (GFR 30-59) ([place]); Medication management; Vitamin D deficiency; REM sleep behavior disorder; and OSA treated with BiPAP on his problem list.  Medications Current Outpatient Prescriptions on File Prior to Visit  Medication Sig  . allopurinol (ZYLOPRIM) 300 MG tablet TAKE 1 TABLET BY MOUTH DAILY.  Marland Kitchen ALPRAZolam (XANAX) 0.5 MG tablet TAKE 1 TABLET BY MOUTH THREE TIMES DAILY  . aspirin EC 81 MG tablet Take 81 mg by mouth at bedtime.  . clotrimazole-betamethasone (LOTRISONE) cream Apply 1 application topically 2 (two) times daily.  Marland Kitchen COLCRYS 0.6 MG tablet Take 1 tablet (0.6 mg total) by mouth 2 (two) times daily.  . CRESTOR 10 MG tablet Take 1 tablet (10 mg total) by mouth at bedtime.  Marland Kitchen EPINEPHrine (EPIPEN 2-PAK) 0.3 mg/0.3 mL IJ SOAJ injection Inject 0.3 mLs (0.3 mg total) into the muscle once.  . meloxicam (MOBIC) 15 MG tablet Take one daily with food for 2 weeks, can take with tylenol, can not take with aleve, iburpofen, then as needed daily for pain  . methocarbamol (ROBAXIN) 500 MG  tablet Take 1 tablet (500 mg total) by mouth every 8 (eight) hours as needed for muscle spasms.  . pramipexole (MIRAPEX) 0.5 MG tablet Take 1 tablet (0.5 mg total) by mouth 3 (three) times daily.  . Probiotic Product (PROBIOTIC DAILY PO) Take 1 tablet by mouth daily.  . rosuvastatin (CRESTOR) 10 MG tablet Take 1 tablet (10 mg total) by mouth daily.  . sitaGLIPtin (JANUVIA) 100 MG tablet Take 1 tablet daily for Diabetes  . valsartan-hydrochlorothiazide (DIOVAN-HCT) 320-25 MG tablet TAKE 1 TABLET BY MOUTH ONCE DAILY  . VENTOLIN HFA 108 (90 Base) MCG/ACT inhaler INHALE 2 PUFFS INTO THE LUNGS EVERY 6 HOURS  AS NEEDED FOR WHEEZING OR SHORTNESS OF BREATH.  . vitamin C (ASCORBIC ACID) 500 MG tablet Take 1 tablet (500 mg total) by mouth 3 (three) times daily.  Marland Kitchen albuterol (PROVENTIL HFA;VENTOLIN HFA) 108 (90 BASE) MCG/ACT inhaler Take 1 to 2 inhalations 4 x daily or every 4 hours if need to rescue asthma  . metFORMIN (GLUCOPHAGE XR) 500 MG 24 hr tablet Take 2 tablets 2 x / day for Diabetes   No current facility-administered medications on file prior to visit.     ROS- see HPI  Physical Exam: Blood pressure 124/78, pulse 75, temperature (!) 97.5 F (36.4 C), resp. rate 16, height 5\' 7"  (1.702 m), weight 292 lb 9.6 oz (132.7 kg), SpO2 97 %. Body mass index is 45.83 kg/m. General Appearance: Well nourished, in no apparent distress. Eyes: PERRLA, EOMs, conjunctiva no swelling or erythema ENT/Mouth: Ext aud canals clear, TMs without erythema, bulging. No erythema, swelling, or exudate on post pharynx.  Tonsils not swollen or erythematous. Hearing normal.  Respiratory: Respiratory effort normal, BS equal bilaterally without rales, rhonchi, wheezing or stridor.  Cardio: RRR with no MRGs. Brisk peripheral pulses without edema.  Abdomen: Soft, + BS.  Non tender, no guarding, rebound, hernias, masses. Musculoskeletal: Full ROM, 5/5 strength, normal gait.  Skin: Warm, dry without rashes, lesions, ecchymosis.  Neuro: Cranial nerves intact. Normal muscle tone, no cerebellar symptoms. Sensation intact.    Plan and Assessment: Diabetes Education: Reviewed 'ABCs' of diabetes management (respective goals in parentheses):  A1C (<7), blood pressure (<130/80), and cholesterol (LDL <70) Eye Exam yearly and Dental Exam every 6 months. Dietary recommendations Physical Activity recommendations - Strongly advised him to start checking sugars at different times of the day - check 2 times a day, rotating checks - given sugar log and advised how to fill it and to bring it at next appt  - given foot care handout and  explained the principles  - given instructions for hypoglycemia management    Future Appointments Date Time Provider [place]  04/12/2017 10:30 AM Vicie Mutters, PA-C [facility] None  05/14/2017 8:30 AM Kathrynn Ducking, MD [facility] None  06/27/2017 2:30 PM Ward Givens, NP [facility] None  01/08/2018 2:00 PM Vicie Mutters, PA-C [facility] None

## 2017-03-12 ENCOUNTER — Other Ambulatory Visit: Payer: Self-pay | Admitting: Internal Medicine

## 2017-03-12 MED FILL — JANUVIA 100 MG TABLET: 100 | 30 days supply | Qty: 30 | Fill #4

## 2017-03-12 MED FILL — VALSARTAN-HCTZ 320-25 MG TA: 320-25 | 30 days supply | Qty: 30 | Fill #0

## 2017-03-26 ENCOUNTER — Other Ambulatory Visit: Payer: Self-pay | Admitting: Neurology

## 2017-03-26 DIAGNOSIS — G20 Parkinson's disease: Secondary | ICD-10-CM

## 2017-04-08 MED FILL — CARBIDOPA-LEVODOPA 25-100 T: 25-100 | 90 days supply | Qty: 270 | Fill #2

## 2017-04-09 MED FILL — JANUVIA 100 MG TABLET: 100 | 30 days supply | Qty: 30 | Fill #5

## 2017-04-09 MED FILL — VALSARTAN-HCTZ 320-25 MG TA: 320-25 | 30 days supply | Qty: 30 | Fill #1

## 2017-04-12 ENCOUNTER — Ambulatory Visit (INDEPENDENT_AMBULATORY_CARE_PROVIDER_SITE_OTHER): Payer: 59 | Admitting: Internal Medicine

## 2017-04-12 ENCOUNTER — Ambulatory Visit: Payer: Self-pay | Admitting: Physician Assistant

## 2017-04-12 ENCOUNTER — Encounter: Payer: Self-pay | Admitting: Internal Medicine

## 2017-04-12 VITALS — BP 136/84 | HR 84 | Temp 97.9°F | Resp 18 | Ht 67.0 in | Wt 291.4 lb

## 2017-04-12 DIAGNOSIS — E1122 Type 2 diabetes mellitus with diabetic chronic kidney disease: Secondary | ICD-10-CM | POA: Diagnosis not present

## 2017-04-12 DIAGNOSIS — I10 Essential (primary) hypertension: Secondary | ICD-10-CM

## 2017-04-12 DIAGNOSIS — N183 Chronic kidney disease, stage 3 unspecified: Secondary | ICD-10-CM

## 2017-04-12 DIAGNOSIS — E559 Vitamin D deficiency, unspecified: Secondary | ICD-10-CM | POA: Diagnosis not present

## 2017-04-12 DIAGNOSIS — E782 Mixed hyperlipidemia: Secondary | ICD-10-CM

## 2017-04-12 DIAGNOSIS — R21 Rash and other nonspecific skin eruption: Secondary | ICD-10-CM | POA: Diagnosis not present

## 2017-04-12 DIAGNOSIS — Z79899 Other long term (current) drug therapy: Secondary | ICD-10-CM

## 2017-04-12 DIAGNOSIS — L309 Dermatitis, unspecified: Secondary | ICD-10-CM | POA: Diagnosis not present

## 2017-04-12 MED FILL — BETAMETHASONE DP AUG 0.05%: 0.05 | 10 days supply | Qty: 15 | Fill #0

## 2017-04-12 NOTE — Progress Notes (Signed)
This very nice 61 y.o. MWM presents for 3 month follow up with Hypertension, Hyperlipidemia, T2-DM, OSA/BiPAP, Parkinson's Dz, and Vitamin D Deficiency. Patient is followed since July 2014 for a R Renal Neoplasm by active surveillance (Dr [name]).      Patient is treated for HTN circas 1998 &  BP has been controlled at home. Today's BP is at goal - 136/84. Patient has had no complaints of any cardiac type chest pain, palpitations, dyspnea / orthopnea / PND, dizziness, claudication, or dependent edema.     Hyperlipidemia is controlled with diet & meds. Patient denies myalgias or other med SE's. Last Lipids were  Lab Results  Component Value Date   CHOL 154 01/03/2017   HDL 33 (L) 01/03/2017   LDLCALC NOT CALC 10/24/2016   TRIG 314 (H) 01/03/2017   CHOLHDL 4.7 01/03/2017      Also, the patient has severe Morbid Obesity (BMI 47+) and consequent T2_NIDDM (2013)  and has had no symptoms of reactive hypoglycemia, diabetic polys, paresthesias or visual blurring.  Last A1c was not at goal: Lab Results  Component Value Date   HGBA1C 9.8 (H) 01/03/2017      Further, the patient also has history of Vitamin D Deficiency ("18"/2010 and "28" in 2017) and he does not supplement vitamin D as recommended. Last vitamin D was still very low:  Lab Results  Component Value Date   VD25OH 10 (L) 01/03/2017   Current Outpatient Medications on File Prior to Visit  Medication Sig  . allopurinol (ZYLOPRIM) 300 MG tablet TAKE 1 TABLET BY MOUTH DAILY.  Marland Kitchen ALPRAZolam (XANAX) 0.5 MG tablet TAKE 1 TABLET BY MOUTH THREE TIMES DAILY  . aspirin EC 81 MG tablet Take 81 mg by mouth at bedtime.  . clotrimazole-betamethasone (LOTRISONE) cream Apply 1 application topically 2 (two) times daily.  Marland Kitchen COLCRYS 0.6 MG tablet Take 1 tablet (0.6 mg total) by mouth 2 (two) times daily.  . CRESTOR 10 MG tablet Take 1 tablet (10 mg total) by mouth at bedtime.  Marland Kitchen EPINEPHrine (EPIPEN 2-PAK) 0.3 mg/0.3 mL IJ SOAJ injection Inject  0.3 mLs (0.3 mg total) into the muscle once.  . meloxicam (MOBIC) 15 MG tablet Take one daily with food for 2 weeks, can take with tylenol, can not take with aleve, iburpofen, then as needed daily for pain  . methocarbamol (ROBAXIN) 500 MG tablet Take 1 tablet (500 mg total) by mouth every 8 (eight) hours as needed for muscle spasms.  . pramipexole (MIRAPEX) 0.5 MG tablet Take 1 tablet (0.5 mg total) by mouth 3 (three) times daily.  . rosuvastatin (CRESTOR) 10 MG tablet Take 1 tablet (10 mg total) by mouth daily.  . sitaGLIPtin (JANUVIA) 100 MG tablet Take 1 tablet daily for Diabetes  . valsartan-hydrochlorothiazide (DIOVAN-HCT) 320-25 MG tablet TAKE 1 TABLET BY MOUTH ONCE DAILY  . VENTOLIN HFA 108 (90 Base) MCG/ACT inhaler INHALE 2 PUFFS INTO THE LUNGS EVERY 6 HOURS AS NEEDED FOR WHEEZING OR SHORTNESS OF BREATH.  . vitamin C (ASCORBIC ACID) 500 MG tablet Take 1 tablet (500 mg total) by mouth 3 (three) times daily.  Marland Kitchen albuterol (PROVENTIL HFA;VENTOLIN HFA) 108 (90 BASE) MCG/ACT inhaler Take 1 to 2 inhalations 4 x daily or every 4 hours if need to rescue asthma  . metFORMIN (GLUCOPHAGE XR) 500 MG 24 hr tablet Take 2 tablets 2 x / day for Diabetes   No current facility-administered medications on file prior to visit.    Allergies  Allergen Reactions  . Ciprofloxacin Hives  . Ceftriaxone Hives   PMHx:   Past Medical History:  Diagnosis Date  . Adult BMI 50.0-59.9 kg/sq m    52.77  . Anal fissure   . Anxiety   . Diabetes mellitus without complication (HCC)    borderline  . GERD (gastroesophageal reflux disease)    30 years ago, maybe from peptic ulcer  . H/O: gout    STABLE  . Hepatic steatosis 10/01/11  . History of kidney stones   . Hyperlipidemia   . Hypertension   . Kidney tumor    right upper kidney tumor, monitoring  . OSA on CPAP   . Parkinson disease ([place]) 01/04/2016  . REM sleep behavior disorder 12/06/2016  . Right ureteral stone   . Vitamin D deficiency   . Weakness     Immunization History  Administered Date(s) Administered  . Influenza Inj Mdck Quad With Preservative 02/22/2017  . Influenza-Unspecified 02/03/2013, 02/05/2014  . Pneumococcal-Unspecified 11/05/2012  . Tdap 03/26/2015   Past Surgical History:  Procedure Laterality Date  . ANAL FISSURE REPAIR  10-12-2000  . COLONOSCOPY N/A 07/01/2012   Procedure: COLONOSCOPY;  Surgeon: Lafayette Dragon, MD;  Location: [facility];  Service: Endoscopy;  Laterality: N/A;  . CYSTOSCOPY WITH RETROGRADE PYELOGRAM, URETEROSCOPY AND STENT PLACEMENT Bilateral 02/26/2013   Procedure: CYSTOSCOPY WITH RETROGRADE PYELOGRAM, URETEROSCOPY AND STENT PLACEMENT;  Surgeon: Alexis Frock, MD;  Location: [facility];  Service: Urology;  Laterality: Bilateral;  . HOLMIUM LASER APPLICATION Bilateral 06/30/3610   Procedure: HOLMIUM LASER APPLICATION;  Surgeon: Alexis Frock, MD;  Location: [facility];  Service: Urology;  Laterality: Bilateral;  . kidney stone removal     06/2011-stent placement  . LEFT MEDIAL FEMORAL CONDYLE DEBRIDEMENT & DRILLING/ REMOVAL LOOSE BODY  09-15-2003  . NASAL SEPTUM SURGERY  1985  . STONE EXTRACTION WITH BASKET  06/12/2011   Procedure: STONE EXTRACTION WITH BASKET;  Surgeon: Claybon Jabs, MD;  Location: [facility];  Service: Urology;  Laterality: Right;   FHx:    Reviewed / unchanged  SHx:    Reviewed / unchanged  Systems Review:  Constitutional: Denies fever, chills, wt changes, headaches, insomnia, fatigue, night sweats, change in appetite. Eyes: Denies redness, blurred vision, diplopia, discharge, itchy, watery eyes.  ENT: Denies discharge, congestion, post nasal drip, epistaxis, sore throat, earache, hearing loss, dental pain, tinnitus, vertigo, sinus pain, snoring.  CV: Denies chest pain, palpitations, irregular heartbeat, syncope, dyspnea, diaphoresis, orthopnea, PND, claudication or edema. Respiratory: denies cough, dyspnea, DOE, pleurisy, hoarseness, laryngitis, wheezing.   Gastrointestinal: Denies dysphagia, odynophagia, heartburn, reflux, water brash, abdominal pain or cramps, nausea, vomiting, bloating, diarrhea, constipation, hematemesis, melena, hematochezia  or hemorrhoids. Genitourinary: Denies dysuria, frequency, urgency, nocturia, hesitancy, discharge, hematuria or flank pain. Musculoskeletal: Denies arthralgias, myalgias, stiffness, jt. swelling, pain, limping or strain/sprain.  Skin: Denies pruritus, rash, hives, warts, acne, eczema or change in skin lesion(s). Neuro: No weakness, tremor, incoordination, spasms, paresthesia or pain. Psychiatric: Denies confusion, memory loss or sensory loss. Endo: Denies change in weight, skin or hair change.  Heme/Lymph: No excessive bleeding, bruising or enlarged lymph nodes.  Physical Exam  BP 136/84   Pulse 84   Temp 97.9 F (36.6 C)   Resp 18   Ht 5\' 7"  (1.702 m)   Wt 291 lb 6.4 oz (132.2 kg)   BMI 45.64 kg/m   Appears well nourished, well groomed  and in no distress.  Eyes: PERRLA, EOMs, conjunctiva no swelling or erythema. Sinuses: No  frontal/maxillary tenderness ENT/Mouth: EAC's clear, TM's nl w/o erythema, bulging. Nares clear w/o erythema, swelling, exudates. Oropharynx clear without erythema or exudates. Oral hygiene is good. Tongue normal, non obstructing. Hearing intact.  Neck: Supple. Thyroid nl. Car 2+/2+ without bruits, nodes or JVD. Chest: Respirations nl with BS clear & equal w/o rales, rhonchi, wheezing or stridor.  Cor: Heart sounds normal w/ regular rate and rhythm without sig. murmurs, gallops, clicks or rubs. Peripheral pulses normal and equal  without edema.  Abdomen: Soft & bowel sounds normal. Non-tender w/o guarding, rebound, hernias, masses or organomegaly.  Lymphatics: Unremarkable.  Musculoskeletal: Full ROM all peripheral extremities, joint stability, 5/5 strength and normal gait. No cog wheeling.  Skin: Warm, dry without exposed rashes, lesions or ecchymosis apparent.  Neuro:  Cranial nerves intact, reflexes equal bilaterally. Sensory-motor testing grossly intact. Tendon reflexes flat thru-out. No rest tremor.  Pysch: Alert & oriented x 3.  Insight and judgement nl & appropriate. No ideations.  Assessment and Plan:  1. Essential hypertension  - Continue medication, monitor blood pressure at home.  - Continue DASH diet. Reminder to go to the ER if any CP,  SOB, nausea, dizziness, severe HA, changes vision/speech. - CBC with Differential/Platelet - BASIC METABOLIC PANEL WITH GFR - Magnesium - TSH  2. Hyperlipidemia, mixed  - Continue diet/meds, exercise,& lifestyle modifications.  - Continue monitor periodic cholesterol/liver & renal functions   - Hepatic function panel - Lipid panel - TSH  3. Type 2 diabetes mellitus with stage 3 chronic kidney disease, without long-term current use of insulin (HCC)  - Continue diet, exercise, lifestyle modifications.  - Monitor appropriate labs. - Advised start Cinnamon 1,000 - 2,000 mg 2 x / day  - Hemoglobin A1c - Insulin, random  4. Vitamin D deficiency  - Continue supplementation.  - VITAMIN D 25 Hydroxy  5. Medication management - CBC with Differential/Platelet - BASIC METABOLIC PANEL WITH GFR - Hepatic function panel - Magnesium - Lipid panel - TSH - Hemoglobin A1c - Insulin, random - VITAMIN D 25 Hydroxy       Discussed  regular exercise, BP monitoring, weight control to achieve/maintain BMI less than 25 and discussed med and SE's. Recommended labs to assess and monitor clinical status with further disposition pending results of labs. Over 30 minutes of exam, counseling, chart review was performed.

## 2017-04-12 NOTE — Patient Instructions (Signed)

## 2017-04-13 LAB — BASIC METABOLIC PANEL WITH GFR
BUN: 23 mg/dL (ref 7–25)
CALCIUM: 10.3 mg/dL (ref 8.6–10.3)
CO2: 24 mmol/L (ref 20–32)
CREATININE: 1.09 mg/dL (ref 0.70–1.25)
Chloride: 103 mmol/L (ref 98–110)
GFR, EST AFRICAN AMERICAN: 84 mL/min/{1.73_m2} (ref >=60)
GFR, EST NON AFRICAN AMERICAN: 72 mL/min/{1.73_m2} (ref >=60)
Glucose, Bld: 117 mg/dL — ABNORMAL HIGH (ref 65–99)
Potassium: 3.7 mmol/L (ref 3.5–5.3)
Sodium: 139 mmol/L (ref 135–146)

## 2017-04-13 LAB — TSH: TSH: 2.28 mIU/L (ref 0.40–4.50)

## 2017-04-13 LAB — CBC WITH DIFFERENTIAL/PLATELET
BASOS PCT: 0.3 %
Basophils Absolute: 34 cells/uL (ref 0–200)
EOS ABS: 171 {cells}/uL (ref 15–500)
Eosinophils Relative: 1.5 %
HEMATOCRIT: 44.3 % (ref 38.5–50.0)
Hemoglobin: 15 g/dL (ref 13.2–17.1)
LYMPHS ABS: 2485 {cells}/uL (ref 850–3900)
MCH: 27.5 pg (ref 27.0–33.0)
MCHC: 33.9 g/dL (ref 32.0–36.0)
MCV: 81.3 fL (ref 80.0–100.0)
MPV: 11.5 fL (ref 7.5–12.5)
Monocytes Relative: 7.1 %
Neutro Abs: 7900 cells/uL — ABNORMAL HIGH (ref 1500–7800)
Neutrophils Relative %: 69.3 %
Platelets: 184 10*3/uL (ref 140–400)
RBC: 5.45 10*6/uL (ref 4.20–5.80)
RDW: 13.8 % (ref 11.0–15.0)
Total Lymphocyte: 21.8 %
WBC: 11.4 10*3/uL — AB (ref 3.8–10.8)
WBCMIX: 809 {cells}/uL (ref 200–950)

## 2017-04-13 LAB — LIPID PANEL
CHOL/HDL RATIO: 6.5 (calc) — AB (ref <5.0)
Cholesterol: 189 mg/dL (ref <200)
HDL: 29 mg/dL — AB (ref >40)
LDL CHOLESTEROL (CALC): 108 mg/dL — AB
NON-HDL CHOLESTEROL (CALC): 160 mg/dL — AB (ref <130)
TRIGLYCERIDES: 364 mg/dL — AB (ref <150)

## 2017-04-13 LAB — HEPATIC FUNCTION PANEL
AG RATIO: 1.1 (calc) (ref 1.0–2.5)
ALBUMIN MSPROF: 4 g/dL (ref 3.6–5.1)
ALT: 10 U/L (ref 9–46)
AST: 14 U/L (ref 10–35)
Alkaline phosphatase (APISO): 48 U/L (ref 40–115)
BILIRUBIN TOTAL: 0.4 mg/dL (ref 0.2–1.2)
Bilirubin, Direct: 0.1 mg/dL (ref 0.0–0.2)
GLOBULIN: 3.5 g/dL (ref 1.9–3.7)
Indirect Bilirubin: 0.3 mg/dL (calc) (ref 0.2–1.2)
Total Protein: 7.5 g/dL (ref 6.1–8.1)

## 2017-04-13 LAB — MAGNESIUM: Magnesium: 1.6 mg/dL (ref 1.5–2.5)

## 2017-04-13 LAB — HEMOGLOBIN A1C
EAG (MMOL/L): 7.9 (calc)
Hgb A1c MFr Bld: 6.6 % of total Hgb — ABNORMAL HIGH (ref <5.7)
Mean Plasma Glucose: 143 (calc)

## 2017-04-13 LAB — INSULIN, RANDOM: Insulin: 53.7 u[IU]/mL — ABNORMAL HIGH (ref 2.0–19.6)

## 2017-04-13 LAB — VITAMIN D 25 HYDROXY (VIT D DEFICIENCY, FRACTURES): Vit D, 25-Hydroxy: 28 ng/mL — ABNORMAL LOW (ref 30–100)

## 2017-04-20 ENCOUNTER — Ambulatory Visit: Payer: 59

## 2017-04-20 ENCOUNTER — Encounter: Payer: Self-pay | Admitting: Physical Therapy

## 2017-04-20 ENCOUNTER — Ambulatory Visit: Payer: 59 | Attending: Neurology | Admitting: Physical Therapy

## 2017-04-20 ENCOUNTER — Ambulatory Visit: Payer: 59 | Admitting: Occupational Therapy

## 2017-04-20 ENCOUNTER — Other Ambulatory Visit: Payer: Self-pay

## 2017-04-20 DIAGNOSIS — R278 Other lack of coordination: Secondary | ICD-10-CM

## 2017-04-20 DIAGNOSIS — R2681 Unsteadiness on feet: Secondary | ICD-10-CM | POA: Diagnosis not present

## 2017-04-20 DIAGNOSIS — R29898 Other symptoms and signs involving the musculoskeletal system: Secondary | ICD-10-CM | POA: Insufficient documentation

## 2017-04-20 DIAGNOSIS — R29818 Other symptoms and signs involving the nervous system: Secondary | ICD-10-CM | POA: Insufficient documentation

## 2017-04-20 DIAGNOSIS — R471 Dysarthria and anarthria: Secondary | ICD-10-CM

## 2017-04-20 DIAGNOSIS — R2689 Other abnormalities of gait and mobility: Secondary | ICD-10-CM | POA: Diagnosis not present

## 2017-04-20 DIAGNOSIS — R293 Abnormal posture: Secondary | ICD-10-CM

## 2017-04-20 NOTE — Therapy (Signed)
[facility] [address] [place], [place], [address] Phone: [phone]   Fax:  [phone]  Speech Language Pathology Evaluation  Patient Details  Name: LADARRELL CORNWALL MRN: [id number] Date of Birth: 1956-02-03 Referring Provider: Neldon Newport., MD   Encounter Date: 04/20/2017  End of Session - 04/20/17 1206    Visit Number  1    Number of Visits  17    Date for SLP Re-Evaluation  07/05/17 pt stated he was likely waiting until 05/08/17 for treatment    SLP Start Time  1105    SLP Stop Time   1145    SLP Time Calculation (min)  40 min    Activity Tolerance  Patient tolerated treatment well       Past Medical History:  Diagnosis Date  . Adult BMI 50.0-59.9 kg/sq m    52.77  . Anal fissure   . Anxiety   . Diabetes mellitus without complication (HCC)    borderline  . GERD (gastroesophageal reflux disease)    30 years ago, maybe from peptic ulcer  . H/O: gout    STABLE  . Hepatic steatosis 10/01/11  . History of kidney stones   . Hyperlipidemia   . Hypertension   . Kidney tumor    right upper kidney tumor, monitoring  . OSA on CPAP   . Parkinson disease ([place]) 01/04/2016  . REM sleep behavior disorder 12/06/2016  . Right ureteral stone   . Vitamin D deficiency   . Weakness     Past Surgical History:  Procedure Laterality Date  . ANAL FISSURE REPAIR  10-12-2000  . COLONOSCOPY N/A 07/01/2012   Procedure: COLONOSCOPY;  Surgeon: Lafayette Dragon, MD;  [facility];  Service: Endoscopy;  Laterality: N/A;  . CYSTOSCOPY WITH RETROGRADE PYELOGRAM, URETEROSCOPY AND STENT PLACEMENT Bilateral 02/26/2013   Procedure: CYSTOSCOPY WITH RETROGRADE PYELOGRAM, URETEROSCOPY AND STENT PLACEMENT;  Surgeon: Alexis Frock, MD;  Location: [facility];  Service: Urology;  Laterality: Bilateral;  . HOLMIUM LASER APPLICATION Bilateral 72/53/6644   Procedure: HOLMIUM LASER APPLICATION;  Surgeon: Alexis Frock, MD;  Location: [facility];   Service: Urology;  Laterality: Bilateral;  . kidney stone removal     06/2011-stent placement  . LEFT MEDIAL FEMORAL CONDYLE DEBRIDEMENT & DRILLING/ REMOVAL LOOSE BODY  09-15-2003  . NASAL SEPTUM SURGERY  1985  . STONE EXTRACTION WITH BASKET  06/12/2011   Procedure: STONE EXTRACTION WITH BASKET;  Surgeon: Claybon Jabs, MD;  Location[facility];  Service: Urology;  Laterality: Right;    There were no vitals filed for this visit.  Subjective Assessment - 04/20/17 1111    Subjective  Pt entered with suboptimal speech loudness.     Currently in Pain?  Yes    Pain Score  2     Pain Location  Back    Pain Orientation  Left;Lower    Pain Descriptors / Indicators  Dull;Aching    Pain Type  Chronic pain    Pain Onset  More than a month ago    Pain Frequency  Intermittent    Aggravating Factors   not sure    Pain Relieving Factors  movement         SLP Evaluation [facility] - 04/20/17 1111      SLP Visit Information   SLP Received On  04/20/17    Referring Provider  Neldon Newport., MD    Onset Date  August 2017    Medical Diagnosis  Parkinsons  Disease      Subjective   Patient/Family Stated Goal  "Keep my volume up." Consistency with volume over time and conversations throughout the day      General Information   HPI  Pt well-known to this SLP from previous course of therapy for early stage PD. Pt benefitted from an improvement in conversational loudness last course, but has regressed back to suboptimal loudness. He has coughing rarely with liquids.       Balance Screen   Has the patient fallen in the past 6 months  No    Has the patient had a decrease in activity level because of a fear of falling?   No    Is the patient reluctant to leave their home because of a fear of falling?   No      Prior Functional Status   Cognitive/Linguistic Baseline  Within functional limits      Cognition   Overall Cognitive Status  Within Functional Limits for tasks assessed       Auditory Comprehension   Overall Auditory Comprehension  Appears within functional limits for tasks assessed      Verbal Expression   Overall Verbal Expression  Appears within functional limits for tasks assessed      Oral Motor/Sensory Function   Overall Oral Motor/Sensory Function  Impaired    Labial ROM  Within Functional Limits    Labial Symmetry  Within Functional Limits    Labial Strength  Reduced slight    Labial Coordination  Reduced    Lingual ROM  Within Functional Limits    Lingual Symmetry  Within Functional Limits    Lingual Strength  Reduced slight    Velum  Within Functional Limits      Motor Speech   Overall Motor Speech  Impaired    Respiration  Within functional limits    Phonation  Low vocal intensity    Intelligibility  Intelligible    Phonation  --    Volume  Soft avg upper 60s dB- 5 min conversation;low 70s dB to begin    Pitch  Appropriate      After 5 minutes of simple conversation pt's speech loudness decr'd to produce average loudness in upper 60s dB.   Pt reported coughing with liquids rarely. SLP to monitor swallow status and recommend objective swallow eval PRN.                  SLP Short Term Goals - 04/20/17 1224      SLP SHORT TERM GOAL #1   Title  pt will maintain loud /a/ average 94dB with appropriate voicing over 4 sessions    Time  4    Period  Weeks    Status  New      SLP SHORT TERM GOAL #2   Title  pt will produce 20/20 sentences with average 70dB over two sessions    Time  4    Period  Weeks    Status  New      SLP SHORT TERM GOAL #3   Title  pt will demo simple conversation of 10 minutes with average 70dB over two sessions    Time  4    Period  Weeks    Status  New       SLP Long Term Goals - 04/20/17 1225      SLP LONG TERM GOAL #1   Title  pt will maintain loud /a/ at 95dB with proper voicing over 5 sessions  Time  8    Period  Weeks    Status  New      SLP LONG TERM GOAL #2   Title  pt will  demo 10 minutes mod complex conversastion with average 70dB over three sessions    Time  8    Period  Weeks    Status  New      SLP LONG TERM GOAL #3   Title  pt will demo 15 minutes mod complex conversation wiht average 70dB outside ST room, over two sessions    Time  8    Period  Weeks    Status  New       Plan - 04/20/17 1207    Clinical Impression Statement  Pt presents with suboptimal speech loudness after 5 minutes simple conversation. Approx one year ago in skilled ST, pt was in WNL loudness range for 15 minutes mod complex conversation at the time he was discharged from speech therapy. Pt requires skilled ST for consistency in maintaining WNL loudness over time.    Speech Therapy Frequency  2x / week    Duration  -- 8 weeks (6 weeks?) or 17 total visits    Treatment/Interventions  Cueing hierarchy;SLP instruction and feedback;Compensatory strategies;Functional tasks;Compensatory techniques;Internal/external aids;Patient/family education    Potential to Achieve Goals  Good    SLP Home Exercise Plan  loud /a/ provided today    Consulted and Agree with Plan of Care  Patient       Patient will benefit from skilled therapeutic intervention in order to improve the following deficits and impairments:   Dysarthria and anarthria    Problem List Patient Active Problem List   Diagnosis Date Noted  . OSA treated with BiPAP 01/03/2017  . REM sleep behavior disorder 12/06/2016  . Uncontrolled secondary diabetes mellitus with stage 3 CKD (GFR 30-59) (HCC) 10/23/2016  . Medication management 10/23/2016  . Vitamin D deficiency 10/23/2016  . Parkinson disease ([place]) 01/04/2016  . Seasonal and perennial allergic rhinitis 06/21/2014  . Asthma, mild intermittent, well-controlled 06/21/2014  . Pseudomonas aeruginosa colonization 10/08/2012  . Hemorrhage of rectum and anus 07/01/2012  . CAD (coronary artery disease) 11/15/2011  . Hypertension 11/15/2011  . Hyperlipidemia 11/15/2011  .  Class 3 obesity due to excess calories with serious comorbidity and body mass index (BMI) of 45.0 to 49.9 in adult 11/15/2011  . Ureteral calculus, right 06/12/2011    [facility] ,[place], CCC-SLP  04/20/2017, 12:25 PM  [facility] [address] [place], [place], [address] Phone: [phone]   Fax:  [phone]  Name: WALKER SITAR MRN: [id number] Date of Birth: 1955/07/25

## 2017-04-20 NOTE — Patient Instructions (Signed)
Complete loud "ah" 5 times, twice daily Focus on loud speech (it will make you normally loud)

## 2017-04-20 NOTE — Therapy (Signed)
[facility] [address] [place], [place], [address] Phone: [phone]   Fax:  [phone]  Occupational Therapy Evaluation  Patient Details  Name: Todd Rice MRN: [id number] Date of Birth: 04-Jan-1956 No Data Recorded  Encounter Date: 04/20/2017  OT End of Session - 04/20/17 1109    Visit Number  1    Number of Visits  11    Date for OT Re-Evaluation  05/27/17    Authorization Type  [place] [employer]    Authorization Time Period  initally scheduled for 2x week x 4 weeks    OT Start Time  1018    OT Stop Time  1100    OT Time Calculation (min)  42 min    Activity Tolerance  Patient tolerated treatment well    Behavior During Therapy  [facility] for tasks assessed/performed       Past Medical History:  Diagnosis Date  . Adult BMI 50.0-59.9 kg/sq m    52.77  . Anal fissure   . Anxiety   . Diabetes mellitus without complication (HCC)    borderline  . GERD (gastroesophageal reflux disease)    30 years ago, maybe from peptic ulcer  . H/O: gout    STABLE  . Hepatic steatosis 10/01/11  . History of kidney stones   . Hyperlipidemia   . Hypertension   . Kidney tumor    right upper kidney tumor, monitoring  . OSA on CPAP   . Parkinson disease ([place]) 01/04/2016  . REM sleep behavior disorder 12/06/2016  . Right ureteral stone   . Vitamin D deficiency   . Weakness     Past Surgical History:  Procedure Laterality Date  . ANAL FISSURE REPAIR  10-12-2000  . COLONOSCOPY N/A 07/01/2012   Procedure: COLONOSCOPY;  Surgeon: Lafayette Dragon, MD;  Location: [facility];  Service: Endoscopy;  Laterality: N/A;  . CYSTOSCOPY WITH RETROGRADE PYELOGRAM, URETEROSCOPY AND STENT PLACEMENT Bilateral 02/26/2013   Procedure: CYSTOSCOPY WITH RETROGRADE PYELOGRAM, URETEROSCOPY AND STENT PLACEMENT;  Surgeon: Alexis Frock, MD;  Location: [facility];  Service: Urology;  Laterality: Bilateral;  . HOLMIUM LASER APPLICATION Bilateral 22/97/9892   Procedure: HOLMIUM LASER APPLICATION;  Surgeon: Alexis Frock, MD;  Location: [facility];  Service: Urology;  Laterality: Bilateral;  . kidney stone removal     06/2011-stent placement  . LEFT MEDIAL FEMORAL CONDYLE DEBRIDEMENT & DRILLING/ REMOVAL LOOSE BODY  09-15-2003  . NASAL SEPTUM SURGERY  1985  . STONE EXTRACTION WITH BASKET  06/12/2011   Procedure: STONE EXTRACTION WITH BASKET;  Surgeon: Claybon Jabs, MD;  Location: [facility];  Service: Urology;  Laterality: Right;    There were no vitals filed for this visit.  Subjective Assessment - 04/20/17 1103    Subjective   Pt with PD who previously received OT at our site returns with a decline in ADLS and RUE coordination/ functional use.    Pertinent History  PD HLD, HTN, DM, obesity    Patient Stated Goals  improve function in right hand    Currently in Pain?  Yes    Pain Score  4     Pain Location  Back    Pain Orientation  Left    Pain Descriptors / Indicators  Aching    Pain Type  Chronic pain    Pain Onset  More than a month ago    Pain Frequency  Intermittent    Aggravating Factors   unsure    Pain Relieving  Factors  moving around    Effect of Pain on Daily Activities  OT will monitor but will not address directly        [facility] OT Assessment - 04/20/17 1023      Assessment   Medical Diagnosis  Parkinson's disease    Referring Provider  Dr. [name]   Prior Therapy  OT      Precautions   Precautions  Fall      Balance Screen   Has the patient fallen in the past 6 months  No    Has the patient had a decrease in activity level because of a fear of falling?   No    Is the patient reluctant to leave their home because of a fear of falling?   No      Home  Environment   Family/patient expects to be discharged to:  Private residence    Lives With  Spouse      Prior Function   Level of [place]  Retired    Customer service manager; walks the dog      ADL   Eating/Feeding  Modified  independent increased difficulty with righ hand use    Grooming  Modified independent    Upper Body Bathing  Modified independent    Lower Body Bathing  Modified independent    Upper Body Dressing  Independent    Lower Body Dressing  Minimal assistance shoes and socks    Toilet Transfer  Modified independent    Tub/Shower Transfer  Modified independent      IADL   Shopping  -- Pt / wife perfrom together    Light Housekeeping  Performs light daily tasks such as dishwashing, bed making    Meal Prep  Able to complete simple cold meal and snack prep    Medication Management  Is responsible for taking medication in correct dosages at correct time    Financial Management  -- wife handles      Written Expression   Dominant Hand  Right    Handwriting  Mild micrographia;100% legible      Vision - History   Baseline Vision  Wears glasses all the time      Vision Assessment   Vision Assessment  Vision not tested      Cognition   Overall Cognitive Status  Within Functional Limits for tasks assessed    Cognition Comments  Pt reports occaisional short term memroy deficits      Observation/Other Assessments   Physical Performance Test    Yes    Simulated Eating Time (seconds)  13.57secs    Donning Doffing Jacket Time (seconds)  20 secs using pt's own jacket with fleece lining    Donning Doffing Jacket Comments  3 button/ unbutton: 24.25 secs      Posture/Postural Control   Posture/Postural Control  Postural limitations    Postural Limitations  Rounded Shoulders;Forward head      Coordination   Gross Motor Movements are Fluid and Coordinated  No    Fine Motor Movements are Fluid and Coordinated  No    Right 9 Hole Peg Test  41.75 secs    Left 9 Hole Peg Test  25.13 secs    Box and Blocks  RUE41 blocks  LUE 47 blocks      AROM   Overall AROM   Deficits    Overall AROM Comments  Grossly WFLS, pt maintins right elbow and hand in flexion  at rest mild limitation in supination- 75% and  right elbow extension                           OT Long Term Goals - 04/20/17 1119      OT LONG TERM GOAL #1   Title  Pt will demonstrate understanding of PD specific HEP following review.    Time  5    Period  Weeks    Status  New      OT LONG TERM GOAL #2   Title  Pt will verbalize understanding of updated adapted strategies for ADLs/IADLs,    Time  5    Period  Weeks    Status  New      OT LONG TERM GOAL #3   Title  Pt will demonstrate improved fine motor coordination for ADLS as evidenced by decreasing RUE 9 hole peg test score to 36 secs or less.    Baseline  RUE 41.75 secs, LUE 25.13    Time  5    Period  Weeks    Status  New      OT LONG TERM GOAL #4   Title  Pt will verbalize understanding of ways to prevent future complications and appropriate community resources    Time  5    Period  Weeks    Status  New      OT LONG TERM GOAL #5   Title  Pt will write a short paragraph with good legibility and no signifiacnt decrease in letter size.    Time  5    Period  Weeks    Status  New            Plan - 04/20/17 1110    Clinical Impression Statement  Pt is a 61 y.o male with a history of DM, HTN, HLD dignosed with PD 01/04/16 by Dr. [name]. Pt presents with decreased coordination,pain, bradykinesia, rigidity,and decreased balance. Pt previoyusly received occupational therapy at this site and he now returns with a decline in coordination and ADLS.Pt can benefit from skilled occupational therapy to maximize independence with ADLs/IADLS and to maintain quality of life.    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) PMH :DM, HLD, HTN, obesity- see snapshot for additional    Occupational performance deficits (Please refer to evaluation for details):  ADL's;IADL's;Work;Play;Leisure;Social Participation    Rehab Potential  Good     Current Impairments/barriers affecting progress:  bradykinesia, rigidity    OT Frequency  --  2x week x 5 weeks or 11 visits over 8 weeks    OT Duration  --    OT Treatment/Interventions  Self-care/ADL training;Moist Heat;Fluidtherapy;DME and/or AE instruction;Balance training;Therapeutic activities;Cognitive remediation/compensation;Therapeutic exercise;Passive range of motion;Functional Mobility Training;Neuromuscular education;Cryotherapy;Paraffin;Energy conservation;Manual Therapy;Patient/family education    Plan  initiate coordination HEP    Consulted and Agree with Plan of Care  Patient       Patient will benefit from skilled therapeutic intervention in order to improve the following deficits and impairments:  Abnormal gait, Decreased range of motion, Difficulty walking, Impaired flexibility, Decreased safety awareness, Decreased endurance, Decreased activity tolerance, Decreased knowledge of precautions, Impaired tone, Impaired UE functional use, Pain, Decreased balance, Decreased cognition, Decreased mobility, Decreased strength  Visit Diagnosis: Other lack of coordination - Plan: Ot plan of care cert/re-cert  Other symptoms and signs involving the nervous system - Plan:  Ot plan of care cert/re-cert  Other symptoms and signs involving the musculoskeletal system - Plan: Ot plan of care cert/re-cert  Abnormal posture - Plan: Ot plan of care cert/re-cert  Unsteadiness on feet - Plan: Ot plan of care cert/re-cert    Problem List Patient Active Problem List   Diagnosis Date Noted  . OSA treated with BiPAP 01/03/2017  . REM sleep behavior disorder 12/06/2016  . Uncontrolled secondary diabetes mellitus with stage 3 CKD (GFR 30-59) (HCC) 10/23/2016  . Medication management 10/23/2016  . Vitamin D deficiency 10/23/2016  . Parkinson disease ([place]) 01/04/2016  . Seasonal and perennial allergic rhinitis 06/21/2014  . Asthma, mild intermittent, well-controlled 06/21/2014  . Pseudomonas  aeruginosa colonization 10/08/2012  . Hemorrhage of rectum and anus 07/01/2012  . CAD (coronary artery disease) 11/15/2011  . Hypertension 11/15/2011  . Hyperlipidemia 11/15/2011  . Class 3 obesity due to excess calories with serious comorbidity and body mass index (BMI) of 45.0 to 49.9 in adult 11/15/2011  . Ureteral calculus, right 06/12/2011    Daytona Hedman 04/20/2017, 11:36 AM Theone Murdoch, OTR/L [phone] Phone: [phone] 11:36 AM 04/20/17 [facility] [address] [place], [place], [address] Phone: [phone]   Fax:  [phone]  Name: JAHMARI ESBENSHADE MRN: [id number] Date of Birth: February 10, 1956

## 2017-04-20 NOTE — Therapy (Signed)
[facility] [address] [place], [place], [address] Phone: [phone]   Fax:  [phone]  Physical Therapy Evaluation  Patient Details  Name: Todd Rice MRN: [id number] Date of Birth: 1961/05/23 Referring Provider: Parkinson's disease   Encounter Date: 04/20/2017  PT End of Session - 04/20/17 1557    Visit Number  1    Number of Visits  9    Date for PT Re-Evaluation  06/19/60    Authorization Type  UMR    PT Start Time  0930    PT Stop Time  1015    PT Time Calculation (min)  45 min    Activity Tolerance  Patient tolerated treatment well    Behavior During Therapy  [facility] for tasks assessed/performed       Past Medical History:  Diagnosis Date  . Adult BMI 50.0-59.9 kg/sq m    52.77  . Anal fissure   . Anxiety   . Diabetes mellitus without complication (HCC)    borderline  . GERD (gastroesophageal reflux disease)    30 years ago, maybe from peptic ulcer  . H/O: gout    STABLE  . Hepatic steatosis 10/01/11  . History of kidney stones   . Hyperlipidemia   . Hypertension   . Kidney tumor    right upper kidney tumor, monitoring  . OSA on CPAP   . Parkinson disease ([place]) 01/04/2016  . REM sleep behavior disorder 12/06/2016  . Right ureteral stone   . Vitamin D deficiency   . Weakness     Past Surgical History:  Procedure Laterality Date  . ANAL FISSURE REPAIR  10-12-2000  . COLONOSCOPY N/A 07/01/2012   Procedure: COLONOSCOPY;  Surgeon: Lafayette Dragon, MD;  [facility]: [facility];  Service: Endoscopy;  Laterality: N/A;  . CYSTOSCOPY WITH RETROGRADE PYELOGRAM, URETEROSCOPY AND STENT PLACEMENT Bilateral 02/26/2013   Procedure: CYSTOSCOPY WITH RETROGRADE PYELOGRAM, URETEROSCOPY AND STENT PLACEMENT;  Surgeon: Alexis Frock, MD;  Location: [facility];  Service: Urology;  Laterality: Bilateral;  . HOLMIUM LASER APPLICATION Bilateral 23/53/6144   Procedure: HOLMIUM LASER APPLICATION;  Surgeon: Alexis Frock,  MD;  Location: [facility];  Service: Urology;  Laterality: Bilateral;  . kidney stone removal     06/2011-stent placement  . LEFT MEDIAL FEMORAL CONDYLE DEBRIDEMENT & DRILLING/ REMOVAL LOOSE BODY  09-15-2003  . NASAL SEPTUM SURGERY  1985  . STONE EXTRACTION WITH BASKET  06/12/2011   Procedure: STONE EXTRACTION WITH BASKET;  Surgeon: Claybon Jabs, MD;  Location: [facility];  Service: Urology;  Laterality: Right;    There were no vitals filed for this visit.   Subjective Assessment - 04/20/17 0936    Subjective  Pt reports he has days where everything seems to be a struggle-have to really pay attention to balance on those days.  No falls in the past 6 months.  Reports having done a few exercises, but mostly has been cleaning up the upstairs of his home.      Patient Stated Goals  Pt's goal for physical therapy is to get looser like I was when I left here last year.    Currently in Pain?  Yes    Pain Score  4     Pain Location  Back    Pain Orientation  Left;Lower    Pain Descriptors / Indicators  Aching    Pain Type  Chronic pain    Pain Onset  More than a month ago  Pain Frequency  Intermittent    Aggravating Factors   unsure what aggravates (did get worse upon waking from lying flat all night)    Pain Relieving Factors  being up and moving around    Effect of Pain on Daily Activities  PT will monitor pain, but will not address as a goal at this time.         [facility] PT Assessment - 04/20/17 0941      Assessment   Medical Diagnosis  Parkinson's disease    Referring Provider  [name]    Onset Date/Surgical Date  -- PD screen fall of 2018      Precautions   Precautions  Fall      Balance Screen   Has the patient fallen in the past 6 months  No    Has the patient had a decrease in activity level because of a fear of falling?   No    Is the patient reluctant to leave their home because of a fear of falling?   No      Home Social worker  Private  residence    Living Arrangements  Spouse/significant other    Available Help at Discharge  Family    Type of [place] to enter    Entrance Stairs-Number of Steps  1    Entrance Stairs-Rails  None    Home Layout  Two level;Able to live on main level with bedroom/bathroom    Additional Comments  Has an eight-month old puppy      Prior Function   Level of Independence  Independent    Vocation  Retired    Leisure  Engineer, manufacturing systems; walks the dog      Observation/Other Assessments   Focus on Therapeutic Outcomes (FOTO)   NA      Posture/Postural Control   Posture/Postural Control  Postural limitations    Postural Limitations  Rounded Shoulders;Forward head      Transfers   Transfers  Sit to Stand;Stand to Sit    Sit to Stand  6: Modified independent (Device/Increase time);Without upper extremity assist;From chair/3-in-1    Five time sit to stand comments   12.16    Stand to Sit  6: Modified independent (Device/Increase time);Without upper extremity assist;To chair/3-in-1      Ambulation/Gait   Ambulation/Gait  Yes    Ambulation/Gait Assistance  7: Independent    Ambulation Distance (Feet)  200 Feet    Assistive device  None    Gait Pattern  Step-through pattern;Decreased step length - right;Decreased arm swing - right;Decreased trunk rotation    Ambulation Surface  Level;Indoor    Gait velocity  9.53 sec = 3.44 ft/sec      Timed Up and Go Test   Normal TUG (seconds)  10.93    Manual TUG (seconds)  11.07    Cognitive TUG (seconds)  11.07    TUG Comments  Scores >13.5 seconds indicate increased fall risk      High Level Balance   High Level Balance Comments  Posterior push and release test:  one step to regain balance.  SLS LLE >10 seconds; 2.44 sec RLE      Functional Gait  Assessment   Gait assessed   Yes    Gait Level Surface  Walks 20 ft in less than 7 sec but greater than 5.5 sec, uses assistive device, slower speed, mild gait deviations, or deviates  6-10 in outside  of the 12 in walkway width. 6.22    Change in Gait Speed  Able to smoothly change walking speed without loss of balance or gait deviation. Deviate no more than 6 in outside of the 12 in walkway width.    Gait with Horizontal Head Turns  Performs head turns smoothly with slight change in gait velocity (eg, minor disruption to smooth gait path), deviates 6-10 in outside 12 in walkway width, or uses an assistive device.    Gait with Vertical Head Turns  Performs task with slight change in gait velocity (eg, minor disruption to smooth gait path), deviates 6 - 10 in outside 12 in walkway width or uses assistive device    Gait and Pivot Turn  Pivot turns safely within 3 sec and stops quickly with no loss of balance.    Step Over Obstacle  Is able to step over one shoe box (4.5 in total height) but must slow down and adjust steps to clear box safely. May require verbal cueing.    Gait with Narrow Base of Support  Is able to ambulate for 10 steps heel to toe with no staggering.    Gait with Eyes Closed  Walks 20 ft, no assistive devices, good speed, no evidence of imbalance, normal gait pattern, deviates no more than 6 in outside 12 in walkway width. Ambulates 20 ft in less than 7 sec. 8.53 sec    Ambulating Backwards  Walks 20 ft, uses assistive device, slower speed, mild gait deviations, deviates 6-10 in outside 12 in walkway width. 13.71    Steps  Alternating feet, must use rail.    Total Score  23    FGA comment:  Scores <22/30 indicate increased fall risk.             Objective measurements completed on examination: See above findings.          Neuro Re-education: Standing PWR! Moves performed: -PWR! Up x 10 reps for posture -PWR! Rock x 10 reps each side, for weightshifting -PWR! Twist x 10 reps each side, for trunk flexibility -PWR! Step x 8 reps each side, for transition step  Brief review of PWR! Moves and relation to functional activities.  Cues provided at R arm  for elbow extension.     PT Education - 04/20/17 1556    Education provided  Yes    Education Details  Importance of consistent Parkinson's specific HEP; initiated standing PWR! Moves HEP    Person(s) Educated  Patient    Methods  Explanation;Demonstration;Handout    Comprehension  Verbalized understanding;Returned demonstration;Verbal cues required       PT Short Term Goals - 02/18/16 1243      PT SHORT TERM GOAL #1   Title  Pt will be independent with HEP for improved balance, transfers, gait.  TARGET 03/19/16    Time  4    Period  Weeks    Status  New      PT SHORT TERM GOAL #2   Title  Pt will verbalize understanding of local community Parkinson's related resources.    Time  4    Period  Weeks    Status  New      PT SHORT TERM GOAL #3   Title  Pt will improve 5x sit<>stand to less than or equal to 11.5 seconds for improved efficiency and safety with gait.    Time  4    Period  Weeks    Status  New  PT Long Term Goals - 04/20/17 1604      PT LONG TERM GOAL #1   Title  Pt will be independent with Parkinson's specific HEP for improved balance and gait.  TARGET 05/18/17    Time  4    Period  Weeks    Status  New    Target Date  05/18/17      PT LONG TERM GOAL #2   Title  Pt will perform 5x sit<>stand in less than 11 seconds for improved transfer efficiency and safety.    Time  4    Period  Weeks    Status  New    Target Date  05/18/17      PT LONG TERM GOAL #3   Title  Pt will improve SLS in RLE to >3 seconds for improved obstacle and stair negotiation.    Time  4    Period  Weeks    Status  New    Target Date  05/18/17      PT LONG TERM GOAL #4   Title  Pt will verbalize plans for continued community fitness upon d/c from PT.    Time  4    Period  Weeks    Status  New    Target Date  05/18/17             Plan - 04/20/17 1559    Clinical Impression Statement  Pt is a 61 year old male with approximately 1 year history of Parkinson's  disease.  He presents to OP PT evaluation following PD screens in 02/2017, where pt reports he is not consistently performing his HEP.  Pt demonstrates bradykinesia, decreased balance, decreased timing and coordination of gait, abnormal posture.  Pt will benefit from skilled PT to address the above sated deficits to decrease fall risk and improve functional mobility to participate in community activities.    History and Personal Factors relevant to plan of care:  not currently performing Parkinson's HEP; >3 co-morbidities    Clinical Presentation  Stable    Clinical Presentation due to:  no falls; history of Parkinson's disease    Clinical Decision Making  Low    Rehab Potential  Good    PT Frequency  2x / week    PT Duration  4 weeks plus eval    PT Treatment/Interventions  ADLs/Self Care Home Management;Gait training;Stair training;Functional mobility training;Therapeutic activities;Therapeutic exercise;Balance training;Patient/family education;Neuromuscular re-education    PT Next Visit Plan  Review standing PWR! Moves and progress for balance; work on low surface transfers, gait training, SLS    Consulted and Agree with Plan of Care  Patient       Patient will benefit from skilled therapeutic intervention in order to improve the following deficits and impairments:  Abnormal gait, Decreased balance, Decreased mobility, Difficulty walking, Postural dysfunction  Visit Diagnosis: Other abnormalities of gait and mobility  Abnormal posture  Unsteadiness on feet     Problem List Patient Active Problem List   Diagnosis Date Noted  . OSA treated with BiPAP 01/03/2017  . REM sleep behavior disorder 12/06/2016  . Uncontrolled secondary diabetes mellitus with stage 3 CKD (GFR 30-59) (HCC) 10/23/2016  . Medication management 10/23/2016  . Vitamin D deficiency 10/23/2016  . Parkinson disease ([place]) 01/04/2016  . Seasonal and perennial allergic rhinitis 06/21/2014  . Asthma, mild  intermittent, well-controlled 06/21/2014  . Pseudomonas aeruginosa colonization 10/08/2012  . Hemorrhage of rectum and anus 07/01/2012  . CAD (coronary artery disease) 11/15/2011  .  Hypertension 11/15/2011  . Hyperlipidemia 11/15/2011  . Class 3 obesity due to excess calories with serious comorbidity and body mass index (BMI) of 45.0 to 49.9 in adult 11/15/2011  . Ureteral calculus, right 06/12/2011    Simmone Cape W. 04/20/2017, 4:08 PM  Frazier Butt., PT   [facility] [address] [place], [place], [address] Phone: [phone]   Fax:  [phone]  Name: PANKAJ HAACK MRN: [id number] Date of Birth: 1955-07-03

## 2017-05-08 MED FILL — VENTOLIN HFA 90 MCG INHALER: 108 (90 BAS | 25 days supply | Qty: 18 | Fill #1

## 2017-05-09 ENCOUNTER — Other Ambulatory Visit: Payer: Self-pay | Admitting: Internal Medicine

## 2017-05-09 ENCOUNTER — Other Ambulatory Visit: Payer: Self-pay | Admitting: Adult Health

## 2017-05-09 DIAGNOSIS — S39012A Strain of muscle, fascia and tendon of lower back, initial encounter: Secondary | ICD-10-CM

## 2017-05-09 MED FILL — ALPRAZolam 0.5 MG TABS: 0.5 | 30 days supply | Qty: 90 | Fill #0

## 2017-05-09 MED FILL — METHOCARBAMOL 500 MG TABS: 500 | 8 days supply | Qty: 24 | Fill #0

## 2017-05-09 MED FILL — VALSARTAN-HCTZ 320-25 MG TA: 320-25 | 30 days supply | Qty: 30 | Fill #2

## 2017-05-10 ENCOUNTER — Encounter: Payer: 59 | Admitting: Occupational Therapy

## 2017-05-14 ENCOUNTER — Encounter: Payer: Self-pay | Admitting: Neurology

## 2017-05-14 ENCOUNTER — Ambulatory Visit: Payer: 59 | Admitting: Occupational Therapy

## 2017-05-14 ENCOUNTER — Ambulatory Visit: Payer: 59 | Admitting: Neurology

## 2017-05-14 ENCOUNTER — Ambulatory Visit
Admission: RE | Admit: 2017-05-14 | Discharge: 2017-05-14 | Disposition: A | Discharge status: Home / Self Care | Payer: 59 | Source: Ambulatory Visit | Attending: Neurology | Admitting: Neurology

## 2017-05-14 ENCOUNTER — Ambulatory Visit: Payer: 59 | Admitting: Physical Therapy

## 2017-05-14 ENCOUNTER — Ambulatory Visit: Payer: 59 | Admitting: Speech Pathology

## 2017-05-14 ENCOUNTER — Telehealth: Payer: Self-pay | Admitting: Neurology

## 2017-05-14 VITALS — BP 158/80 | HR 75 | Ht 67.0 in | Wt 285.0 lb

## 2017-05-14 DIAGNOSIS — M79604 Pain in right leg: Secondary | ICD-10-CM

## 2017-05-14 DIAGNOSIS — G20 Parkinson's disease: Secondary | ICD-10-CM

## 2017-05-14 DIAGNOSIS — G4752 REM sleep behavior disorder: Secondary | ICD-10-CM | POA: Diagnosis not present

## 2017-05-14 DIAGNOSIS — M1611 Unilateral primary osteoarthritis, right hip: Secondary | ICD-10-CM | POA: Diagnosis not present

## 2017-05-14 DIAGNOSIS — M47816 Spondylosis without myelopathy or radiculopathy, lumbar region: Secondary | ICD-10-CM | POA: Diagnosis not present

## 2017-05-14 MED ORDER — PRAMIPEXOLE DIHYDROCHLORIDE 0.75 MG PO TABS: 0.7500 mg | ORAL_TABLET | Freq: Three times a day (TID) | ORAL | 2 refills | Status: DC

## 2017-05-14 MED ORDER — PREDNISONE 10 MG PO TABS: ORAL_TABLET | ORAL | 0 refills | Status: DC

## 2017-05-14 MED FILL — PRAMIPEXOLE 1.5 MG TABLET: 1.5 | 90 days supply | Qty: 135 | Fill #0

## 2017-05-14 MED FILL — predniSONE 10 MG TABS: 10 | 12 days supply | Qty: 42 | Fill #0

## 2017-05-14 NOTE — Telephone Encounter (Signed)
I called the patient.  The x-rays of the hips show mild arthritis on both sides.  There is anterolisthesis of L4 on L5 that is mild, but the patient also has facet joint arthritis involving the L5-S1 level, worse on the right, this is the likely source of pain.  In the future, the patient can be subjected to a facet joint injection at the L5-S1 level on the right.   X-ray lumbar spine,05/14/17:  IMPRESSION: Degenerative changes in the lower lumbar spine with very mild anterolisthesis of L4 on L5.   XR right hip 05/14/17:  IMPRESSION: Early osteoarthritic changes in the hips bilaterally. No acute bony abnormality.

## 2017-05-14 NOTE — Progress Notes (Signed)
Reason for visit: Parkinson's disease  Todd Rice is an 62 y.o. male  History of present illness:  Todd Rice is a 62 year old right-handed white male with a history of Parkinson's disease.  The patient has been set up for physical, occupational, and speech therapy that will start soon and go for about 1 month.  The patient continues to have some clumsiness of the right hand.  The patient is retired as a [employer] fundraiser, he was unable to continue to perform his job tasks because of the right hand dysfunction.  The patient has not had much in way of tremors, he remains on low-dose Sinemet and Mirapex taking 0.5 mg 3 times daily.  He is tolerating these medications well.  Within the last 5 days he has developed problems with right hip and thigh discomfort down to the knee, and some pain into the low back.  In the past he has had bouts of low back pain without leg pain.  He is on methocarbamol without much benefit, he has not been able to sleep well.  He reports no weakness of the right leg.  He indicates that when he sits down the pain markedly improves, it becomes severe when he stands up, he is unable to lie flat on his back because of pain which is difficult for him as he is on CPAP.  The patient has not reported any falls, he denies issues with swallowing or choking.  He does have some trouble with fatigue, he tries to remain active during the day.  He returns for an evaluation.  Past Medical History:  Diagnosis Date  . Adult BMI 50.0-59.9 kg/sq m    52.77  . Anal fissure   . Anxiety   . Diabetes mellitus without complication (HCC)    borderline  . GERD (gastroesophageal reflux disease)    30 years ago, maybe from peptic ulcer  . H/O: gout    STABLE  . Hepatic steatosis 10/01/11  . History of kidney stones   . Hyperlipidemia   . Hypertension   . Kidney tumor    right upper kidney tumor, monitoring  . OSA on CPAP   . Parkinson disease ([place]) 01/04/2016  . REM sleep behavior  disorder 12/06/2016  . Right ureteral stone   . Vitamin D deficiency   . Weakness     Past Surgical History:  Procedure Laterality Date  . ANAL FISSURE REPAIR  10-12-2000  . COLONOSCOPY N/A 07/01/2012   Procedure: COLONOSCOPY;  Surgeon: Lafayette Dragon, MD;  [facility];  Service: Endoscopy;  Laterality: N/A;  . CYSTOSCOPY WITH RETROGRADE PYELOGRAM, URETEROSCOPY AND STENT PLACEMENT Bilateral 02/26/2013   Procedure: CYSTOSCOPY WITH RETROGRADE PYELOGRAM, URETEROSCOPY AND STENT PLACEMENT;  Surgeon: Alexis Frock, MD;  [facility];  Service: Urology;  Laterality: Bilateral;  . HOLMIUM LASER APPLICATION Bilateral 27/07/5007   Procedure: HOLMIUM LASER APPLICATION;  Surgeon: Alexis Frock, MD;  [facility];  Service: Urology;  Laterality: Bilateral;  . kidney stone removal     06/2011-stent placement  . LEFT MEDIAL FEMORAL CONDYLE DEBRIDEMENT & DRILLING/ REMOVAL LOOSE BODY  09-15-2003  . NASAL SEPTUM SURGERY  1985  . STONE EXTRACTION WITH BASKET  06/12/2011   Procedure: STONE EXTRACTION WITH BASKET;  Surgeon: Claybon Jabs, MD;  Location: [facility];  Service: Urology;  Laterality: Right;    Family History  Problem Relation Age of Onset  . Heart disease Mother        Atrial fibrillation  .  Hypertension Mother   . Hyperlipidemia Mother   . Lung cancer Father        was a smoker  . Cancer Father        lung  . Hyperlipidemia Brother   . Heart disease Maternal Aunt   . Hyperlipidemia Maternal Aunt   . Hypertension Maternal Aunt   . Stroke Maternal Aunt   . Heart disease Paternal Grandmother   . Hyperlipidemia Paternal Grandmother     Social history:  reports that  has never smoked. he has never used smokeless tobacco. He reports that he does not drink alcohol or use drugs.    Allergies  Allergen Reactions  . Ciprofloxacin Hives  . Ceftriaxone Hives    Medications:  Prior to Admission medications   Medication Sig Start Date End Date Taking?  Authorizing Provider  allopurinol (ZYLOPRIM) 300 MG tablet TAKE 1 TABLET BY MOUTH DAILY. 12/20/15  Yes Unk Pinto, MD  ALPRAZolam Duanne Moron) 0.5 MG tablet TAKE 1 TABLET BY MOUTH THREE TIMES DAILY 05/09/17  Yes Vicie Mutters, PA-C  aspirin EC 81 MG tablet Take 81 mg by mouth at bedtime.   Yes [provider]  clotrimazole-betamethasone (LOTRISONE) cream Apply 1 application topically 2 (two) times daily. 01/03/17  Yes Vicie Mutters, PA-C  COLCRYS 0.6 MG tablet Take 1 tablet (0.6 mg total) by mouth 2 (two) times daily. 12/11/14  Yes Forcucci, Courtney, PA-C  CRESTOR 10 MG tablet Take 1 tablet (10 mg total) by mouth at bedtime. 10/24/16  Yes Vicie Mutters, PA-C  EPINEPHrine (EPIPEN 2-PAK) 0.3 mg/0.3 mL IJ SOAJ injection Inject 0.3 mLs (0.3 mg total) into the muscle once. 03/27/14  Yes Kelby Aline, PA-C  meloxicam (MOBIC) 15 MG tablet Take one daily with food for 2 weeks, can take with tylenol, can not take with aleve, iburpofen, then as needed daily for pain 01/16/17  Yes Corbett, Caryl Pina, NP  methocarbamol (ROBAXIN) 500 MG tablet TAKE 1 TABLET BY MOUTH EVERY 8 HOURS AS NEEDED FOR MUSCLE SPASMS. 05/09/17  Yes Vicie Mutters, PA-C  pramipexole (MIRAPEX) 0.5 MG tablet Take 1 tablet (0.5 mg total) by mouth 3 (three) times daily. 12/06/16  Yes Kathrynn Ducking, MD  rosuvastatin (CRESTOR) 10 MG tablet Take 1 tablet (10 mg total) by mouth daily. 10/26/16  Yes Vicie Mutters, PA-C  sitaGLIPtin (JANUVIA) 100 MG tablet Take 1 tablet daily for Diabetes 11/17/16 05/20/17 Yes Unk Pinto, MD  valsartan-hydrochlorothiazide (DIOVAN-HCT) 320-25 MG tablet TAKE 1 TABLET BY MOUTH ONCE DAILY 03/12/17  Yes [name], NP  VENTOLIN HFA 108 (90 Base) MCG/ACT inhaler INHALE 2 PUFFS INTO THE LUNGS EVERY 6 HOURS AS NEEDED FOR WHEEZING OR SHORTNESS OF BREATH. 07/17/16  Yes Unk Pinto, MD  vitamin C (ASCORBIC ACID) 500 MG tablet Take 1 tablet (500 mg total) by mouth 3 (three) times daily. 01/16/17  Yes [name], NP  Vitamin D, Ergocalciferol, (DRISDOL) 50000 units CAPS capsule Take 50,000 Units by mouth every 7 (seven) days.   Yes [provider]  albuterol (PROVENTIL HFA;VENTOLIN HFA) 108 (90 BASE) MCG/ACT inhaler Take 1 to 2 inhalations 4 x daily or every 4 hours if need to rescue asthma 12/15/14 04/10/16  Unk Pinto, MD  metFORMIN (GLUCOPHAGE XR) 500 MG 24 hr tablet Take 2 tablets 2 x / day for Diabetes 07/14/16 01/14/17  Unk Pinto, MD    ROS:  Out of a complete 14 system review of symptoms, the patient complains only of the following symptoms, and all other reviewed systems are negative.  Back pain, right leg pain  Height 5\' 7"  (1.702 m), weight 285 lb (129.3 kg).  Physical Exam  General: The patient is alert and cooperative at the time of the examination.  The patient is moderately to markedly obese.  Neuromuscular: Internal and external rotation of the right hip does not result in any discomfort.  Skin: No significant peripheral edema is noted.   Neurologic Exam  Mental status: The patient is alert and oriented x 3 at the time of the examination. The patient has apparent normal recent and remote memory, with an apparently normal attention span and concentration ability.   Cranial nerves: Facial symmetry is present. Speech is normal, no aphasia or dysarthria is noted. Extraocular movements are full. Visual fields are full.  Masking of the face is seen.  With inferior gaze, lid lag is noted.  Motor: The patient has good strength in all 4 extremities.  Sensory examination: Soft touch sensation is symmetric on the face, arms, and legs.  Coordination: The patient has good finger-nose-finger and heel-to-shin bilaterally.  No tremor seen.  Gait and station: The patient has a limping type gait on the right leg. Tandem gait is normal. Romberg is negative. No drift is seen.  Reflexes: Deep tendon reflexes are symmetric, reflexes in the lower extremities are  well-maintained at the knees and ankles.   Assessment/Plan:  1.  Parkinson's disease  2.  Back pain, right leg pain  The back pain and leg pain likely represent a facet joint arthritis problem.  We will set him up for x-rays of the right hip and lumbosacral spine.  The patient will be placed on a prednisone Dosepak, this has helped in the past, he will watch his blood sugars on this medication.  The patient will be increased on the Mirapex again to the 0.75 mg tablet, eventually working up to 1 tablet 3 times daily.  He will follow-up in 6 months, sooner if needed.  Jill Alexanders MD 05/14/2017 8:20 AM  [facility] [address] [place], [place] [address]  Phone [phone] Fax [phone]

## 2017-05-14 NOTE — Patient Instructions (Signed)
   We will get X-rays of the low back and the right hip.  We will start prednisone for the low back pain.  We will start Mirapex 0.75 mg tablets three times a day, take one 0.75 mg tablet in place of one 0.5 mg tablet every 2 weeks until you are one one 0.75 mg tablet three times a day:  Week 1-2:  0.5/ 0.5/ 0.75  Week 3-4:  0.75/ 0.5/ 0.75  Then take 0.75 mg tablet three times a day.

## 2017-05-18 ENCOUNTER — Ambulatory Visit: Payer: 59 | Admitting: Physical Therapy

## 2017-05-18 ENCOUNTER — Ambulatory Visit: Payer: 59

## 2017-05-18 ENCOUNTER — Encounter: Payer: Self-pay | Admitting: Physical Therapy

## 2017-05-18 ENCOUNTER — Ambulatory Visit: Payer: 59 | Attending: Internal Medicine | Admitting: Occupational Therapy

## 2017-05-18 ENCOUNTER — Encounter: Payer: Self-pay | Admitting: Occupational Therapy

## 2017-05-18 DIAGNOSIS — R2689 Other abnormalities of gait and mobility: Secondary | ICD-10-CM | POA: Diagnosis not present

## 2017-05-18 DIAGNOSIS — R2681 Unsteadiness on feet: Secondary | ICD-10-CM | POA: Diagnosis not present

## 2017-05-18 DIAGNOSIS — R29818 Other symptoms and signs involving the nervous system: Secondary | ICD-10-CM | POA: Insufficient documentation

## 2017-05-18 DIAGNOSIS — R293 Abnormal posture: Secondary | ICD-10-CM

## 2017-05-18 DIAGNOSIS — R29898 Other symptoms and signs involving the musculoskeletal system: Secondary | ICD-10-CM | POA: Diagnosis not present

## 2017-05-18 DIAGNOSIS — R278 Other lack of coordination: Secondary | ICD-10-CM | POA: Diagnosis not present

## 2017-05-18 DIAGNOSIS — R471 Dysarthria and anarthria: Secondary | ICD-10-CM | POA: Insufficient documentation

## 2017-05-18 NOTE — Therapy (Signed)
[facility] [address] [place], [place], [address] Phone: [phone]   Fax:  [phone]  Occupational Therapy Treatment  Patient Details  Name: Todd Rice MRN: [id number] Date of Birth: February 04, 1956 Referring Provider: Parkinson's disease   Encounter Date: 05/18/2017  OT End of Session - 05/18/17 1224    Visit Number  2    Number of Visits  11    Date for OT Re-Evaluation  07/14/17 date extended due to missed visits    Authorization Type  [facility] [employer]    Authorization Time Period  initally scheduled for 2x week x 4 weeks    OT Start Time  0935    OT Stop Time  1015    OT Time Calculation (min)  40 min    Activity Tolerance  Patient tolerated treatment well    Behavior During Therapy  [facility] for tasks assessed/performed       Past Medical History:  Diagnosis Date  . Adult BMI 50.0-59.9 kg/sq m    52.77  . Anal fissure   . Anxiety   . Diabetes mellitus without complication (HCC)    borderline  . GERD (gastroesophageal reflux disease)    30 years ago, maybe from peptic ulcer  . H/O: gout    STABLE  . Hepatic steatosis 10/01/11  . History of kidney stones   . Hyperlipidemia   . Hypertension   . Kidney tumor    right upper kidney tumor, monitoring  . OSA on CPAP   . Parkinson disease ([place]) 01/04/2016  . REM sleep behavior disorder 12/06/2016  . Right ureteral stone   . Vitamin D deficiency   . Weakness     Past Surgical History:  Procedure Laterality Date  . ANAL FISSURE REPAIR  10-12-2000  . COLONOSCOPY N/A 07/01/2012   Procedure: COLONOSCOPY;  Surgeon: Lafayette Dragon, MD;  [facility];  Service: Endoscopy;  Laterality: N/A;  . CYSTOSCOPY WITH RETROGRADE PYELOGRAM, URETEROSCOPY AND STENT PLACEMENT Bilateral 02/26/2013   Procedure: CYSTOSCOPY WITH RETROGRADE PYELOGRAM, URETEROSCOPY AND STENT PLACEMENT;  Surgeon: Alexis Frock, MD;  [facility];  Service: Urology;  Laterality: Bilateral;  .  HOLMIUM LASER APPLICATION Bilateral 69/62/9528   Procedure: HOLMIUM LASER APPLICATION;  Surgeon: Alexis Frock, MD;  [facility];  Service: Urology;  Laterality: Bilateral;  . kidney stone removal     06/2011-stent placement  . LEFT MEDIAL FEMORAL CONDYLE DEBRIDEMENT & DRILLING/ REMOVAL LOOSE BODY  09-15-2003  . NASAL SEPTUM SURGERY  1985  . STONE EXTRACTION WITH BASKET  06/12/2011   Procedure: STONE EXTRACTION WITH BASKET;  Surgeon: Claybon Jabs, MD;  Location: [facility];  Service: Urology;  Laterality: Right;    There were no vitals filed for this visit.  Subjective Assessment - 05/18/17 0935    Pertinent History  PD HLD, HTN, DM, obesity    Patient Stated Goals  improve function in right hand    Currently in Pain?  Yes    Pain Score  2     Pain Location  Hip    Pain Orientation  Right    Pain Descriptors / Indicators  Aching    Pain Type  Acute pain    Pain Onset  More than a month ago    Pain Frequency  Intermittent    Aggravating Factors   walking    Pain Relieving Factors  meds    Multiple Pain Sites  No  OT Education - 05/18/17 1002    Education provided  Yes    Education Details  PWR!hands basic  4, coordination HEP    Person(s) Educated  Patient;Spouse    Methods  Explanation;Demonstration;Verbal cues;Handout    Comprehension  Verbalized understanding;Returned demonstration          OT Long Term Goals - 04/20/17 1119      OT LONG TERM GOAL #1   Title  Pt will demonstrate understanding of PD specific HEP following review.    Time  5    Period  Weeks    Status  New      OT LONG TERM GOAL #2   Title  Pt will verbalize understanding of updated adapted strategies for ADLs/IADLs,    Time  5    Period  Weeks    Status  New      OT LONG TERM GOAL #3   Title  Pt will demonstrate improved fine motor coordination for ADLS as evidenced by decreasing RUE 9 hole peg test score to 36 secs or less.     Baseline  RUE 41.75 secs, LUE 25.13    Time  5    Period  Weeks    Status  New      OT LONG TERM GOAL #4   Title  Pt will verbalize understanding of ways to prevent future complications and appropriate community resources    Time  5    Period  Weeks    Status  New      OT LONG TERM GOAL #5   Title  Pt will write a short paragraph with good legibility and no signifiacnt decrease in letter size.    Time  5    Period  Weeks    Status  New            Plan - 05/18/17 1010    Clinical Impression Statement  Pt is progressing towards goals for fine motor coordination. He demonstrates understanding of coordination HEP.    Rehab Potential  Good    OT Frequency  Other (comment) 11 visits x 8 weeks    OT Duration  8 weeks    OT Treatment/Interventions  Self-care/ADL training;Moist Heat;Fluidtherapy;DME and/or AE instruction;Balance training;Therapeutic activities;Cognitive remediation/compensation;Therapeutic exercise;Passive range of motion;Functional Mobility Training;Neuromuscular education;Cryotherapy;Paraffin;Energy conservation;Manual Therapy;Patient/family education    Plan  big movements with ADLS    Consulted and Agree with Plan of Care  Patient;Family member/caregiver       Patient will benefit from skilled therapeutic intervention in order to improve the following deficits and impairments:  Abnormal gait, Decreased range of motion, Difficulty walking, Impaired flexibility, Decreased safety awareness, Decreased endurance, Decreased activity tolerance, Decreased knowledge of precautions, Impaired tone, Impaired UE functional use, Pain, Decreased balance, Decreased cognition, Decreased mobility, Decreased strength  Visit Diagnosis: Other lack of coordination  Other symptoms and signs involving the nervous system  Other symptoms and signs involving the musculoskeletal system  Abnormal posture  Other abnormalities of gait and mobility    Problem List Patient Active  Problem List   Diagnosis Date Noted  . OSA treated with BiPAP 01/03/2017  . REM sleep behavior disorder 12/06/2016  . Uncontrolled secondary diabetes mellitus with stage 3 CKD (GFR 30-59) (HCC) 10/23/2016  . Medication management 10/23/2016  . Vitamin D deficiency 10/23/2016  . Parkinson disease ([place]) 01/04/2016  . Seasonal and perennial allergic rhinitis 06/21/2014  . Asthma, mild intermittent, well-controlled 06/21/2014  . Pseudomonas aeruginosa colonization 10/08/2012  . Hemorrhage of rectum  and anus 07/01/2012  . CAD (coronary artery disease) 11/15/2011  . Hypertension 11/15/2011  . Hyperlipidemia 11/15/2011  . Class 3 obesity due to excess calories with serious comorbidity and body mass index (BMI) of 45.0 to 49.9 in adult 11/15/2011  . Ureteral calculus, right 06/12/2011    Seerat Peaden 05/18/2017, 12:27 PM Theone Murdoch, OTR/L [phone] Phone: [phone] 12:27 PM 01/11/19[facility] [address] [place], [place], [address] Phone: [phone]   Fax:  [phone]  Name: Todd Rice MRN: [id number] Date of Birth: 04/06/1956

## 2017-05-18 NOTE — Patient Instructions (Signed)
Practice effortful/louder voice in conversation, continue loud "ah" x5 reps, twice each day.

## 2017-05-18 NOTE — Patient Instructions (Addendum)
PWR! Hands  With arms stretched out in front of you (elbows straight), perform the following:  PWR! Rock: Move wrists up and down [employer]! Twist: Twist palms up and down BIG  Then, start with elbows bent and hands closed.  PWR! Step: Touch index finger to thumb while keeping other fingers straight. Flick fingers out BIG (thumb out/straighten fingers). Repeat with other fingers. (Step your thumb to each finger).  PWR! Hands: Push hands out BIG. Elbows straight, wrists up, fingers open and spread apart BIG. (Can also perform by pushing down on table, chair, knees. Push above head, out to the side, behind you, in front of you.)   ** Make each movement big and deliberate so that you feel the movement.  Perform at least 10 repetitions 1x/day, but perform PWR! hands throughout the day when you are having trouble using your hands (picking up/manipulating small objects, writing, eating, typing, sewing, buttoning, etc.).   Coordination Activities  Perform the following activities for 20 minutes 1 times per day with both hand(s).   Rotate ball in fingertips (clockwise and counter-clockwise).  Toss ball between hands.  Toss ball in air and catch with the same hand.  Flip cards 1 at a time as fast as you can.  Deal cards with your thumb (Hold deck in hand and push card off top with thumb).  Flick cards off table by extending fingers  Pick up coins and stack.  Pick up coins one at a time until you get 5-10 in your hand, then move coins from palm to fingertips to stack one at a time.  Practice writing and/or typing.

## 2017-05-18 NOTE — Therapy (Signed)
[facility] [address] [place], [place], [address] Phone: [phone]   Fax:  [phone]  Speech Language Pathology Treatment  Patient Details  Name: Todd Rice MRN: [id number] Date of Birth: 02/25/1956 Referring Provider: Neldon Newport., MD   Encounter Date: 05/18/2017  End of Session - 05/18/17 1524    Visit Number  2    Number of Visits  17    Date for SLP Re-Evaluation  07/05/17    SLP Start Time  1105    SLP Stop Time   1145    SLP Time Calculation (min)  40 min    Activity Tolerance  Patient tolerated treatment well       Past Medical History:  Diagnosis Date  . Adult BMI 50.0-59.9 kg/sq m    52.77  . Anal fissure   . Anxiety   . Diabetes mellitus without complication (HCC)    borderline  . GERD (gastroesophageal reflux disease)    30 years ago, maybe from peptic ulcer  . H/O: gout    STABLE  . Hepatic steatosis 10/01/11  . History of kidney stones   . Hyperlipidemia   . Hypertension   . Kidney tumor    right upper kidney tumor, monitoring  . OSA on CPAP   . Parkinson disease ([place]) 01/04/2016  . REM sleep behavior disorder 12/06/2016  . Right ureteral stone   . Vitamin D deficiency   . Weakness     Past Surgical History:  Procedure Laterality Date  . ANAL FISSURE REPAIR  10-12-2000  . COLONOSCOPY N/A 07/01/2012   Procedure: COLONOSCOPY;  Surgeon: Lafayette Dragon, MD;  [facility];  Service: Endoscopy;  Laterality: N/A;  . CYSTOSCOPY WITH RETROGRADE PYELOGRAM, URETEROSCOPY AND STENT PLACEMENT Bilateral 02/26/2013   Procedure: CYSTOSCOPY WITH RETROGRADE PYELOGRAM, URETEROSCOPY AND STENT PLACEMENT;  Surgeon: Alexis Frock, MD;  [facility];  Service: Urology;  Laterality: Bilateral;  . HOLMIUM LASER APPLICATION Bilateral 50/01/3817   Procedure: HOLMIUM LASER APPLICATION;  Surgeon: Alexis Frock, MD;  [facility];  Service: Urology;  Laterality: Bilateral;  . kidney stone  removal     06/2011-stent placement  . LEFT MEDIAL FEMORAL CONDYLE DEBRIDEMENT & DRILLING/ REMOVAL LOOSE BODY  09-15-2003  . NASAL SEPTUM SURGERY  1985  . STONE EXTRACTION WITH BASKET  06/12/2011   Procedure: STONE EXTRACTION WITH BASKET;  Surgeon: Claybon Jabs, MD;  Location: [facility];  Service: Urology;  Laterality: Right;    There were no vitals filed for this visit.  Subjective Assessment - 05/18/17 1106    Subjective  Pt entered with suboptimal speech loudness.     Patient is accompained by:  Family member wife    Currently in Pain?  Yes    Pain Score  4     Pain Location  Hip    Pain Orientation  Right    Pain Descriptors / Indicators  Aching    Pain Type  Acute pain    Pain Onset  Today    Pain Frequency  Intermittent    Aggravating Factors   movement    Pain Relieving Factors  prednisone/meds            ADULT SLP TREATMENT - 05/18/17 1108      General Information   Behavior/Cognition  Alert;Cooperative;Pleasant mood      Treatment Provided   Treatment provided  Cognitive-Linquistic      Cognitive-Linquistic Treatment   Treatment focused on  Dysarthria  Skilled Treatment  SLP used loud /a/ to recalibrate conversational volume to WNL. Avergae mid 90sdB. Pt responded with sentence and multisentence responses with average low 70s dB. Conversation around clinic resulted in pt 100 intelligible and using louder speech than when he entered [place] room.       Assessment / Recommendations / Plan   Plan  Continue with current plan of care      Progression Toward Goals   Progression toward goals  Progressing toward goals         SLP Short Term Goals - 05/18/17 1111      SLP SHORT TERM GOAL #1   Title  pt will maintain loud /a/ average 94dB with appropriate voicing over 4 sessions    Time  4    Period  Weeks    Status  On-going      SLP SHORT TERM GOAL #2   Title  pt will produce 20/20 sentences with average 70dB over two sessions    Time  4     Period  Weeks    Status  On-going      SLP SHORT TERM GOAL #3   Title  pt will demo simple conversation of 10 minutes with average 70dB over two sessions    Time  4    Period  Weeks    Status  On-going       SLP Long Term Goals - 05/18/17 1112      SLP LONG TERM GOAL #1   Title  pt will maintain loud /a/ at 95dB with proper voicing over 5 sessions    Time  8    Period  Weeks    Status  On-going      SLP LONG TERM GOAL #2   Title  pt will demo 10 minutes mod complex conversastion with average 70dB over three sessions    Time  8    Period  Weeks    Status  On-going      SLP LONG TERM GOAL #3   Title  pt will demo 15 minutes mod complex conversation wiht average 70dB outside ST room, over two sessions    Time  8    Period  Weeks    Status  On-going       Plan - 05/18/17 1524    Clinical Impression Statement  Pt presents with intermittent WNL speech loudness in 7 minutes simple conversation with occasional min cues. Pt cont to require skilled ST for consistency in maintaining WNL loudness over time. Based upon success today I suspect pt will not need to be seen for > 4 weeks (8 ST sessions)    Speech Therapy Frequency  2x / week    Duration  -- 8 weeks (6 weeks?) or 17 total visits    Treatment/Interventions  Cueing hierarchy;SLP instruction and feedback;Compensatory strategies;Functional tasks;Compensatory techniques;Internal/external aids;Patient/family education    Potential to Achieve Goals  Good    SLP Home Exercise Plan  loud /a/ provided today    Consulted and Agree with Plan of Care  Patient       Patient will benefit from skilled therapeutic intervention in order to improve the following deficits and impairments:   Dysarthria and anarthria    Problem List Patient Active Problem List   Diagnosis Date Noted  . OSA treated with BiPAP 01/03/2017  . REM sleep behavior disorder 12/06/2016  . Uncontrolled secondary diabetes mellitus with stage 3 CKD (GFR 30-59)  (HCC) 10/23/2016  . Medication management  10/23/2016  . Vitamin D deficiency 10/23/2016  . Parkinson disease ([place]) 01/04/2016  . Seasonal and perennial allergic rhinitis 06/21/2014  . Asthma, mild intermittent, well-controlled 06/21/2014  . Pseudomonas aeruginosa colonization 10/08/2012  . Hemorrhage of rectum and anus 07/01/2012  . CAD (coronary artery disease) 11/15/2011  . Hypertension 11/15/2011  . Hyperlipidemia 11/15/2011  . Class 3 obesity due to excess calories with serious comorbidity and body mass index (BMI) of 45.0 to 49.9 in adult 11/15/2011  . Ureteral calculus, right 06/12/2011    [facility] ,MS, CCC-SLP  05/18/2017, 3:26 PM  [facility] [address] [place], [place], [address] Phone: [phone]   Fax:  [phone]   Name: Todd Rice MRN: [id number] Date of Birth: [birth year]

## 2017-05-18 NOTE — Therapy (Signed)
[facility] [address] [place], [place], [address] Phone: [phone]   Fax:  [phone]  Physical Therapy Treatment  Patient Details  Name: Todd [place] MRN: [id number] Date of Birth: [birth year] Referring Provider: Parkinson's disease   Encounter Date: 05/18/2017  PT End of Session - 05/18/17 1318    Visit Number  2    Number of Visits  9    Date for PT Re-Evaluation  --    Authorization Type  [facility]    PT Start Time  1022    PT Stop Time  1101    PT Time Calculation (min)  39 min    Activity Tolerance  Patient tolerated treatment well    Behavior During Therapy  [facility] for tasks assessed/performed       Past Medical History:  Diagnosis Date  . Adult BMI 50.0-59.9 kg/sq m    52.77  . Anal fissure   . Anxiety   . Diabetes mellitus without complication (HCC)    borderline  . GERD (gastroesophageal reflux disease)    30 years ago, maybe from peptic ulcer  . H/O: gout    STABLE  . Hepatic steatosis 10/01/11  . History of kidney stones   . Hyperlipidemia   . Hypertension   . Kidney tumor    right upper kidney tumor, monitoring  . OSA on CPAP   . Parkinson disease ([place]) 01/04/2016  . REM sleep behavior disorder 12/06/2016  . Right ureteral stone   . Vitamin D deficiency   . Weakness     Past Surgical History:  Procedure Laterality Date  . ANAL FISSURE REPAIR  10-12-2000  . COLONOSCOPY N/A 07/01/2012   Procedure: COLONOSCOPY;  Surgeon: Lafayette Dragon, MD;  [facility];  Service: Endoscopy;  Laterality: N/A;  . CYSTOSCOPY WITH RETROGRADE PYELOGRAM, URETEROSCOPY AND STENT PLACEMENT Bilateral 02/26/2013   Procedure: CYSTOSCOPY WITH RETROGRADE PYELOGRAM, URETEROSCOPY AND STENT PLACEMENT;  Surgeon: Alexis Frock, MD;  [facility]: [facility];  Service: Urology;  Laterality: Bilateral;  . HOLMIUM LASER APPLICATION Bilateral 82/42/3536   Procedure: HOLMIUM LASER APPLICATION;  Surgeon: Alexis Frock, MD;   Location: [facility];  Service: Urology;  Laterality: Bilateral;  . kidney stone removal     06/2011-stent placement  . LEFT MEDIAL FEMORAL CONDYLE DEBRIDEMENT & DRILLING/ REMOVAL LOOSE BODY  09-15-2003  . NASAL SEPTUM SURGERY  1985  . STONE EXTRACTION WITH BASKET  06/12/2011   Procedure: STONE EXTRACTION WITH BASKET;  Surgeon: Claybon Jabs, MD;  Location: [facility];  Service: Urology;  Laterality: Right;    There were no vitals filed for this visit.  Subjective Assessment - 05/18/17 1024    Subjective  Had to cancel earlier this week, due to R hip and leg pain that started last week.  X-rays show some arthritis in low back; Dr. [name] put on prednisone, which has been helping.    Patient Stated Goals  Pt's goal for physical therapy is to get looser like I was when I left here last year.    Currently in Pain?  Yes    Pain Score  2     Pain Location  Hip    Pain Orientation  Right    Pain Descriptors / Indicators  Aching    Pain Type  Acute pain    Pain Onset  Today    Pain Frequency  Intermittent    Aggravating Factors   walking    Pain Relieving Factors  prednisone  helping                      [name] Adult PT Treatment/Exercise - 05/18/17 1028      Bed Mobility   Bed Mobility  Sit to Supine;Right Sidelying to Sit;Rolling Right;Sit to Sidelying Right    Rolling Right  5: Supervision    Rolling Right Details (indicate cue type and reason)  Cues for rolling to initiate coming up to sit through sidelying    Right Sidelying to Sit  5: Supervision    Right Sidelying to Sit Details (indicate cue type and reason)  Cues for technique    Sit to Supine  6: Modified independent (Device/Increase time)    Sit to Sidelying Right  5: Supervision    Sit to Sidelying Right Details (indicate cue type and reason)  Cues for technique for sit>sidelying>supine to avoid twisting, increased strain on low back      Ambulation/Gait   Ambulation/Gait  Yes    Ambulation/Gait  Assistance  7: Independent    Ambulation Distance (Feet)  230 Feet x 2    Assistive device  None boom whackers to assist with arm swing    Gait Pattern  Step-through pattern;Decreased step length - right;Decreased arm swing - right;Decreased trunk rotation    Ambulation Surface  Level;Indoor    Gait Comments  Verbal, tactile cues for increased arm swing, increased trunk rotation with gait to break up trunk stiffness.  Pt responds well to tactile cues through shoulders to initiate trunk rotation.      Exercises   Exercises  Lumbar      Lumbar Exercises: Stretches   Single Knee to Chest Stretch  Right;Left;3 reps;10 seconds using towel to assist    Lower Trunk Rotation  2 reps;10 seconds preceded by rocking side to side 10 reps      Lumbar Exercises: Seated   Other Seated Lumbar Exercises  Seated anterior/posterior pelvic tilt x 10 reps       Lumbar Exercises: Supine   Pelvic Tilt  10 reps 3 seconds        [facility]) - 05/18/17 1051    PWR! exercises  Moves in sitting;Moves in standing    PWR! Up Standing  x 10 resp    PWR! Sit to Stand  x 8 reps    PWR! Up Sitting  x 20 reps       Self Care:  Discussed and demo positioning in bed, using pillow under knees for positioning.  Discussed optimal neutral posture in sitting, avoiding slumped posture in sitting, with use of pillow or towel roll for support of low back.   PT Education - 05/18/17 1317    Education provided  Yes    Education Details  lumbar flexibility exercises, bed mobility technique and positioning    Person(s) Educated  Patient;Spouse    Methods  Explanation;Demonstration;Verbal cues    Comprehension  Verbalized understanding          PT Long Term Goals - 05/18/17 1322      PT LONG TERM GOAL #1   Title  Pt will be independent with Parkinson's specific HEP for improved balance and gait.  Updated target 06/15/17 (due to pt returning for first visit on 05/18/17)    Time  4    Period  Weeks    Status  On-going     Target Date  06/15/17      PT LONG TERM GOAL #2   Title  Pt will perform 5x sit<>stand in less than 11 seconds for improved transfer efficiency and safety.    Time  4    Period  Weeks    Status  On-going    Target Date  06/15/17      PT LONG TERM GOAL #3   Title  Pt will improve SLS in RLE to >3 seconds for improved obstacle and stair negotiation.    Time  4    Period  Weeks    Status  On-going    Target Date  06/15/17      PT LONG TERM GOAL #4   Title  Pt will verbalize plans for continued community fitness upon d/c from PT.    Time  4    Period  Weeks    Status  On-going    Target Date  06/15/17            Plan - 05/18/17 1319    Clinical Impression Statement  Pt has not been seen since PT eval on 04/20/17, due to pt's wanting to wait until after holidays to start PT, OT, speech.  In addition, he cancelled previous PT visit this week due to increased back/hip pain, with visit to MD.  He had x-ray, showing lumbar spine arthritis, with no precautions per patient report.  Skilled PT session today focused on initaition of gentle lumbar flexibility and positioning instruction.  Please note updated goal dates due to pt's first visit today since eval.    Rehab Potential  Good    PT Frequency  2x / week    PT Duration  4 weeks plus eval    PT Treatment/Interventions  ADLs/Self Care Home Management;Gait training;Stair training;Functional mobility training;Therapeutic activities;Therapeutic exercise;Balance training;Patient/family education;Neuromuscular re-education    PT Next Visit Plan  Review standing PWR! Moves and progress for balance; work on low surface transfers, gait training, SLS    Consulted and Agree with Plan of Care  Patient       Patient will benefit from skilled therapeutic intervention in order to improve the following deficits and impairments:  Abnormal gait, Decreased balance, Decreased mobility, Difficulty walking, Postural dysfunction  Visit  Diagnosis: Other abnormalities of gait and mobility  Abnormal posture  Unsteadiness on feet  Other symptoms and signs involving the nervous system     Problem List Patient Active Problem List   Diagnosis Date Noted  . OSA treated with BiPAP 01/03/2017  . REM sleep behavior disorder 12/06/2016  . Uncontrolled secondary diabetes mellitus with stage 3 CKD (GFR 30-59) (HCC) 10/23/2016  . Medication management 10/23/2016  . Vitamin D deficiency 10/23/2016  . Parkinson disease ([place]) 01/04/2016  . Seasonal and perennial allergic rhinitis 06/21/2014  . Asthma, mild intermittent, well-controlled 06/21/2014  . Pseudomonas aeruginosa colonization 10/08/2012  . Hemorrhage of rectum and anus 07/01/2012  . CAD (coronary artery disease) 11/15/2011  . Hypertension 11/15/2011  . Hyperlipidemia 11/15/2011  . Class 3 obesity due to excess calories with serious comorbidity and body mass index (BMI) of 45.0 to 49.9 in adult 11/15/2011  . Ureteral calculus, right 06/12/2011    Darrel Gloss W. 05/18/2017, 1:24 PM Frazier Butt., PT [facility] [address] [place], [place], [address] Phone: [phone]   Fax:  [phone]  Name: CASHTON HOSLEY MRN: [id number] Date of Birth: 17-Jan-1956

## 2017-05-21 ENCOUNTER — Other Ambulatory Visit: Payer: Self-pay | Admitting: Internal Medicine

## 2017-05-21 MED FILL — JANUVIA 100 MG TABLET: 100 | 60 days supply | Qty: 60 | Fill #0

## 2017-05-22 ENCOUNTER — Encounter: Payer: Self-pay | Admitting: Occupational Therapy

## 2017-05-22 ENCOUNTER — Ambulatory Visit: Payer: 59 | Admitting: Speech Pathology

## 2017-05-22 ENCOUNTER — Ambulatory Visit: Payer: 59 | Admitting: Physical Therapy

## 2017-05-22 ENCOUNTER — Other Ambulatory Visit: Payer: Self-pay

## 2017-05-22 ENCOUNTER — Encounter: Payer: Self-pay | Admitting: Physical Therapy

## 2017-05-22 ENCOUNTER — Ambulatory Visit: Payer: 59 | Admitting: Occupational Therapy

## 2017-05-22 ENCOUNTER — Encounter: Payer: Self-pay | Admitting: Speech Pathology

## 2017-05-22 DIAGNOSIS — R29818 Other symptoms and signs involving the nervous system: Secondary | ICD-10-CM

## 2017-05-22 DIAGNOSIS — R293 Abnormal posture: Secondary | ICD-10-CM

## 2017-05-22 DIAGNOSIS — R2689 Other abnormalities of gait and mobility: Secondary | ICD-10-CM | POA: Diagnosis not present

## 2017-05-22 DIAGNOSIS — R29898 Other symptoms and signs involving the musculoskeletal system: Secondary | ICD-10-CM

## 2017-05-22 DIAGNOSIS — R278 Other lack of coordination: Secondary | ICD-10-CM | POA: Diagnosis not present

## 2017-05-22 DIAGNOSIS — R2681 Unsteadiness on feet: Secondary | ICD-10-CM | POA: Diagnosis not present

## 2017-05-22 DIAGNOSIS — R471 Dysarthria and anarthria: Secondary | ICD-10-CM

## 2017-05-22 NOTE — Patient Instructions (Signed)
Provided patient with: -standing gastroc stretch on 2" block (or on step), 3 reps each leg x 30 seconds   To be performed PWR! UP, PWR! Rock, NOT PWR! TWIST due to hip pain, PWR! Step x 20 reps each

## 2017-05-22 NOTE — Therapy (Signed)
[facility] [address] [place], [place], [address] Phone: [phone]   Fax:  [phone]  Occupational Therapy Treatment  Patient Details  Name: [place] MRN: [id number] Date of Birth: 03-23-1956 Referring Provider: Parkinson's disease   Encounter Date: 05/22/2017  OT End of Session - 05/22/17 0853    Visit Number  3    Number of Visits  11    Date for OT Re-Evaluation  07/14/17    Authorization Type  [facility] [employer]    OT Start Time  0850    OT Stop Time  0930    OT Time Calculation (min)  40 min       Past Medical History:  Diagnosis Date  . Adult BMI 50.0-59.9 kg/sq m    52.77  . Anal fissure   . Anxiety   . Diabetes mellitus without complication (HCC)    borderline  . GERD (gastroesophageal reflux disease)    30 years ago, maybe from peptic ulcer  . H/O: gout    STABLE  . Hepatic steatosis 10/01/11  . History of kidney stones   . Hyperlipidemia   . Hypertension   . Kidney tumor    right upper kidney tumor, monitoring  . OSA on CPAP   . Parkinson disease ([place]) 01/04/2016  . REM sleep behavior disorder 12/06/2016  . Right ureteral stone   . Vitamin D deficiency   . Weakness     Past Surgical History:  Procedure Laterality Date  . ANAL FISSURE REPAIR  10-12-2000  . COLONOSCOPY N/A 07/01/2012   Procedure: COLONOSCOPY;  Surgeon: Lafayette Dragon, MD;  [facility];  Service: Endoscopy;  Laterality: N/A;  . CYSTOSCOPY WITH RETROGRADE PYELOGRAM, URETEROSCOPY AND STENT PLACEMENT Bilateral 02/26/2013   Procedure: CYSTOSCOPY WITH RETROGRADE PYELOGRAM, URETEROSCOPY AND STENT PLACEMENT;  Surgeon: Alexis Frock, MD;  [facility];  Service: Urology;  Laterality: Bilateral;  . HOLMIUM LASER APPLICATION Bilateral 44/07/4740   Procedure: HOLMIUM LASER APPLICATION;  Surgeon: Alexis Frock, MD;  [facility];  Service: Urology;  Laterality: Bilateral;  . kidney stone removal     06/2011-stent  placement  . LEFT MEDIAL FEMORAL CONDYLE DEBRIDEMENT & DRILLING/ REMOVAL LOOSE BODY  09-15-2003  . NASAL SEPTUM SURGERY  1985  . STONE EXTRACTION WITH BASKET  06/12/2011   Procedure: STONE EXTRACTION WITH BASKET;  Surgeon: Claybon Jabs, MD;  Location: [facility];  Service: Urology;  Laterality: Right;    There were no vitals filed for this visit.  Subjective Assessment - 05/22/17 0852    Pertinent History  PD HLD, HTN, DM, obesity    Patient Stated Goals  improve function in right hand    Currently in Pain?  Yes    Pain Score  2     Pain Location  Hip    Pain Orientation  Right    Pain Descriptors / Indicators  Aching    Pain Type  Acute pain    Pain Onset  1 to 4 weeks ago    Pain Frequency  Intermittent    Aggravating Factors   movement    Pain Relieving Factors  meds    Multiple Pain Sites  No          Treatment:PWR! Seated 10-20 reps each for warm up, min v.c and demonstration for larger amplitude movements. Pt practiced donning doffing shoes and socks using sock funnel, foot stool and sock aide. Pt demonstrates increased ease using AE. Dynamic step and  reach to place small pegs in vertical pegboard and to copy design, min v.c for larger amplitude movements, and for correct design in standing.                      OT Long Term Goals - 04/20/17 1119      OT LONG TERM GOAL #1   Title  Pt will demonstrate understanding of PD specific HEP following review.    Time  5    Period  Weeks    Status  New      OT LONG TERM GOAL #2   Title  Pt will verbalize understanding of updated adapted strategies for ADLs/IADLs,    Time  5    Period  Weeks    Status  New      OT LONG TERM GOAL #3   Title  Pt will demonstrate improved fine motor coordination for ADLS as evidenced by decreasing RUE 9 hole peg test score to 36 secs or less.    Baseline  RUE 41.75 secs, LUE 25.13    Time  5    Period  Weeks    Status  New      OT LONG TERM GOAL #4    Title  Pt will verbalize understanding of ways to prevent future complications and appropriate community resources    Time  5    Period  Weeks    Status  New      OT LONG TERM GOAL #5   Title  Pt will write a short paragraph with good legibility and no signifiacnt decrease in letter size.    Time  5    Period  Weeks    Status  New            Plan - 05/22/17 4742    Clinical Impression Statement  Pt is progressing towards goals. He demonstrates understanding of adapted strategies/ AE for donning socks and shoes.    Rehab Potential  Good    Current Impairments/barriers affecting progress:  bradykinesia, rigidity    OT Frequency  -- 11 visits    OT Duration  8 weeks    OT Treatment/Interventions  Self-care/ADL training;Moist Heat;Fluidtherapy;DME and/or AE instruction;Balance training;Therapeutic activities;Cognitive remediation/compensation;Therapeutic exercise;Passive range of motion;Functional Mobility Training;Neuromuscular education;Cryotherapy;Paraffin;Energy conservation;Manual Therapy;Patient/family education    Plan  big movements with ADLS, fine motor coordination    Consulted and Agree with Plan of Care  Patient;Family member/caregiver       Patient will benefit from skilled therapeutic intervention in order to improve the following deficits and impairments:  Abnormal gait, Decreased range of motion, Difficulty walking, Impaired flexibility, Decreased safety awareness, Decreased endurance, Decreased activity tolerance, Decreased knowledge of precautions, Impaired tone, Impaired UE functional use, Pain, Decreased balance, Decreased cognition, Decreased mobility, Decreased strength  Visit Diagnosis: Other symptoms and signs involving the nervous system  Other lack of coordination  Other symptoms and signs involving the musculoskeletal system    Problem List Patient Active Problem List   Diagnosis Date Noted  . OSA treated with BiPAP 01/03/2017  . REM sleep behavior  disorder 12/06/2016  . Uncontrolled secondary diabetes mellitus with stage 3 CKD (GFR 30-59) (HCC) 10/23/2016  . Medication management 10/23/2016  . Vitamin D deficiency 10/23/2016  . Parkinson disease ([place]) 01/04/2016  . Seasonal and perennial allergic rhinitis 06/21/2014  . Asthma, mild intermittent, well-controlled 06/21/2014  . Pseudomonas aeruginosa colonization 10/08/2012  . Hemorrhage of rectum and anus 07/01/2012  . CAD (coronary artery disease)  11/15/2011  . Hypertension 11/15/2011  . Hyperlipidemia 11/15/2011  . Class 3 obesity due to excess calories with serious comorbidity and body mass index (BMI) of 45.0 to 49.9 in adult 11/15/2011  . Ureteral calculus, right 06/12/2011    Todd [place] 05/22/2017, 9:25 AM  [facility] [address] [place], [place], [address] Phone: [phone]   Fax:  [phone]  Name: [place] MRN: [id number] Date of Birth: [birth year]

## 2017-05-22 NOTE — Therapy (Signed)
[facility] [address] [place], [place], [address] Phone: [phone]   Fax:  [phone]  Speech Language Pathology Treatment  Patient Details  Name: [place] MRN: [id number] Date of Birth: [birth year] Referring Provider: Neldon Newport., MD   Encounter Date: 05/22/2017  End of Session - 05/22/17 1204    Visit Number  3    Number of Visits  17    Date for SLP Re-Evaluation  07/05/17    SLP Start Time  24    SLP Stop Time   1100    SLP Time Calculation (min)  42 min    Activity Tolerance  Patient tolerated treatment well       Past Medical History:  Diagnosis Date  . Adult BMI 50.0-59.9 kg/sq m    52.77  . Anal fissure   . Anxiety   . Diabetes mellitus without complication (HCC)    borderline  . GERD (gastroesophageal reflux disease)    30 years ago, maybe from peptic ulcer  . H/O: gout    STABLE  . Hepatic steatosis 10/01/11  . History of kidney stones   . Hyperlipidemia   . Hypertension   . Kidney tumor    right upper kidney tumor, monitoring  . OSA on CPAP   . Parkinson disease ([place]) 01/04/2016  . REM sleep behavior disorder 12/06/2016  . Right ureteral stone   . Vitamin D deficiency   . Weakness     Past Surgical History:  Procedure Laterality Date  . ANAL FISSURE REPAIR  10-12-2000  . COLONOSCOPY N/A 07/01/2012   Procedure: COLONOSCOPY;  Surgeon: Lafayette Dragon, MD;  [facility];  Service: Endoscopy;  Laterality: N/A;  . CYSTOSCOPY WITH RETROGRADE PYELOGRAM, URETEROSCOPY AND STENT PLACEMENT Bilateral 02/26/2013   Procedure: CYSTOSCOPY WITH RETROGRADE PYELOGRAM, URETEROSCOPY AND STENT PLACEMENT;  Surgeon: Alexis Frock, MD;  [facility];  Service: Urology;  Laterality: Bilateral;  . HOLMIUM LASER APPLICATION Bilateral 61/60/7371   Procedure: HOLMIUM LASER APPLICATION;  Surgeon: Alexis Frock, MD;  [facility];  Service: Urology;  Laterality: Bilateral;  . kidney stone  removal     06/2011-stent placement  . LEFT MEDIAL FEMORAL CONDYLE DEBRIDEMENT & DRILLING/ REMOVAL LOOSE BODY  09-15-2003  . NASAL SEPTUM SURGERY  1985  . STONE EXTRACTION WITH BASKET  06/12/2011   Procedure: STONE EXTRACTION WITH BASKET;  Surgeon: Claybon Jabs, MD;  Location: [facility];  Service: Urology;  Laterality: Right;    There were no vitals filed for this visit.  Subjective Assessment - 05/22/17 0943    Subjective  "I saw you one time last year"    Patient is accompained by:  Family member spouse    Currently in Pain?  Yes    Pain Score  2     Pain Location  Hip    Pain Orientation  Right    Pain Descriptors / Indicators  Aching    Pain Type  Acute pain    Pain Onset  1 to 4 weeks ago    Pain Frequency  Intermittent    Aggravating Factors   movement    Pain Relieving Factors  meds    Effect of Pain on Daily Activities  monitor            ADULT SLP TREATMENT - 05/22/17 0947      General Information   Behavior/Cognition  Alert;Cooperative;Pleasant mood      Treatment Provided   Treatment provided  Cognitive-Linquistic      Cognitive-Linquistic Treatment   Treatment focused on  Dysarthria    Skilled Treatment  Loud /a/ average 94dB to recalibrate volume. Structured speech tasks average 72dB with rare min A . Simple conversation over 15 minutes average of 72dB with rare min A (visual cues)      Assessment / Recommendations / Plan   Plan  Continue with current plan of care      Progression Toward Goals   Progression toward goals  Progressing toward goals       SLP Education - 05/22/17 1021    Education provided  Yes    Education Details  continue twice daily loud /a/; continue structured volume practice after loud /a/    Person(s) Educated  Patient;Spouse    Methods  Explanation;Verbal cues    Comprehension  Verbalized understanding       SLP Short Term Goals - 05/22/17 1204      SLP SHORT TERM GOAL #1   Title  pt will maintain loud  /a/ average 94dB with appropriate voicing over 4 sessions    Baseline  05/22/17;     Time  3    Period  Weeks    Status  On-going      SLP SHORT TERM GOAL #2   Title  pt will produce 20/20 sentences with average 70dB over two sessions    Baseline  05/22/17    Time  3    Period  Weeks    Status  On-going      SLP SHORT TERM GOAL #3   Title  pt will demo simple conversation of 10 minutes with average 70dB over two sessions    Time  3    Period  Weeks    Status  On-going       SLP Long Term Goals - 05/22/17 1023      SLP LONG TERM GOAL #1   Title  pt will maintain loud /a/ at 95dB with proper voicing over 5 sessions    Time  7    Period  Weeks    Status  On-going      SLP LONG TERM GOAL #2   Title  pt will demo 10 minutes mod complex conversastion with average 70dB over three sessions    Time  7    Period  Weeks    Status  On-going      SLP LONG TERM GOAL #3   Title  pt will demo 15 minutes mod complex conversation wiht average 70dB outside ST room, over two sessions    Time  7    Period  Weeks    Status  On-going       Plan - 05/22/17 1022    Clinical Impression Statement  Pt presents with intermittent WNL speech loudness in 15 minutes simple conversation with occasional min cues. Pt cont to require skilled ST for consistency in maintaining WNL loudness over time. Based upon success today I suspect pt will not need to be seen for > 4 weeks (8 ST sessions)    Speech Therapy Frequency  2x / week    Treatment/Interventions  Cueing hierarchy;SLP instruction and feedback;Compensatory strategies;Functional tasks;Compensatory techniques;Internal/external aids;Patient/family education    Potential to Achieve Goals  Good       Patient will benefit from skilled therapeutic intervention in order to improve the following deficits and impairments:   Dysarthria and anarthria    Problem List Patient Active Problem List   Diagnosis  Date Noted  . OSA treated with BiPAP  01/03/2017  . REM sleep behavior disorder 12/06/2016  . Uncontrolled secondary diabetes mellitus with stage 3 CKD (GFR 30-59) (HCC) 10/23/2016  . Medication management 10/23/2016  . Vitamin D deficiency 10/23/2016  . Parkinson disease ([place]) 01/04/2016  . Seasonal and perennial allergic rhinitis 06/21/2014  . Asthma, mild intermittent, well-controlled 06/21/2014  . Pseudomonas aeruginosa colonization 10/08/2012  . Hemorrhage of rectum and anus 07/01/2012  . CAD (coronary artery disease) 11/15/2011  . Hypertension 11/15/2011  . Hyperlipidemia 11/15/2011  . Class 3 obesity due to excess calories with serious comorbidity and body mass index (BMI) of 45.0 to 49.9 in adult 11/15/2011  . Ureteral calculus, right 06/12/2011    Lovvorn, Annye Rusk MS, CCC-SLP 05/22/2017, 12:05 PM  [facility] [address] [place], [place], [address] Phone: [phone]   Fax:  [phone]   Name: [place] MRN: [id number] Date of Birth: 01/30/1956

## 2017-05-22 NOTE — Therapy (Signed)
[facility] [address] [place], [place], [address] Phone: [phone]   Fax:  [phone]  Physical Therapy Treatment  Patient Details  Name: Todd Rice MRN: [id number] Date of Birth: April 26, 1956 Referring Provider: Parkinson's disease   Encounter Date: 05/22/2017  PT End of Session - 05/22/17 2204    Visit Number  3    Number of Visits  9    Authorization Type  UMR    PT Start Time  1020    PT Stop Time  1100    PT Time Calculation (min)  40 min    Activity Tolerance  Patient tolerated treatment well    Behavior During Therapy  [facility] for tasks assessed/performed       Past Medical History:  Diagnosis Date  . Adult BMI 50.0-59.9 kg/sq m    52.77  . Anal fissure   . Anxiety   . Diabetes mellitus without complication (HCC)    borderline  . GERD (gastroesophageal reflux disease)    30 years ago, maybe from peptic ulcer  . H/O: gout    STABLE  . Hepatic steatosis 10/01/11  . History of kidney stones   . Hyperlipidemia   . Hypertension   . Kidney tumor    right upper kidney tumor, monitoring  . OSA on CPAP   . Parkinson disease ([place]) 01/04/2016  . REM sleep behavior disorder 12/06/2016  . Right ureteral stone   . Vitamin D deficiency   . Weakness     Past Surgical History:  Procedure Laterality Date  . ANAL FISSURE REPAIR  10-12-2000  . COLONOSCOPY N/A 07/01/2012   Procedure: COLONOSCOPY;  Surgeon: Lafayette Dragon, MD;  [facility];  Service: Endoscopy;  Laterality: N/A;  . CYSTOSCOPY WITH RETROGRADE PYELOGRAM, URETEROSCOPY AND STENT PLACEMENT Bilateral 02/26/2013   Procedure: CYSTOSCOPY WITH RETROGRADE PYELOGRAM, URETEROSCOPY AND STENT PLACEMENT;  Surgeon: Alexis Frock, MD;  [facility];  Service: Urology;  Laterality: Bilateral;  . HOLMIUM LASER APPLICATION Bilateral 32/20/2542   Procedure: HOLMIUM LASER APPLICATION;  Surgeon: Alexis Frock, MD;  [facility];  Service: Urology;   Laterality: Bilateral;  . kidney stone removal     06/2011-stent placement  . LEFT MEDIAL FEMORAL CONDYLE DEBRIDEMENT & DRILLING/ REMOVAL LOOSE BODY  09-15-2003  . NASAL SEPTUM SURGERY  1985  . STONE EXTRACTION WITH BASKET  06/12/2011   Procedure: STONE EXTRACTION WITH BASKET;  Surgeon: Claybon Jabs, MD;  Location: [facility];  Service: Urology;  Laterality: Right;    There were no vitals filed for this visit.  Subjective Assessment - 05/22/17 1022    Subjective  Hip feels better-just when I turn certain ways its a shooting pain.  The pillow under my knees has helped my back.    Patient Stated Goals  Pt's goal for physical therapy is to get looser like I was when I left here last year.    Currently in Pain?  Yes    Pain Score  1  1-2/10    Pain Location  Hip    Pain Orientation  Right    Pain Descriptors / Indicators  Shooting    Pain Onset  Today    Pain Frequency  Intermittent    Aggravating Factors   twisting/putting weight on it    Pain Relieving Factors  medications, pillow under knees                      [facility]  Adult PT Treatment/Exercise - 05/22/17 1024      Bed Mobility   Bed Mobility  Sit to Supine;Right Sidelying to Sit;Rolling Right;Sit to Sidelying Right    Rolling Right  6: Modified independent (Device/Increase time)    Rolling Right Details (indicate cue type and reason)  Improved log rolling technique with sit<>supine through sidelying    Right Sidelying to Sit  6: Modified independent (Device/Increase time)    Sit to Supine  6: Modified independent (Device/Increase time)    Sit to Sidelying Right  6: Modified independent (Device/Increase time)      Exercises   Exercises  Lumbar;Ankle Review of HEP last visit- needs cues for correct technique      Lumbar Exercises: Stretches   Single Knee to Chest Stretch  Right;Left;3 reps;10 seconds using sheet towel to assist    Lower Trunk Rotation  2 reps;10 seconds      Lumbar Exercises:  Seated   Other Seated Lumbar Exercises  Seated anterior/posterior pelvic tilt x 10 reps       Lumbar Exercises: Supine   Pelvic Tilt  10 reps 3 seconds      Ankle Exercises: Stretches   Gastroc Stretch  3 reps;30 seconds At edge of step     Other Stretch  Tried runner's stretch, x 2 reps 15 seconds, and tried foot propped on 2" block x 30 seconds each leg, with pt reporting best stretch at step or at block.        Ankle Exercises: Standing   Heel Raises  20 reps    Toe Raise  20 reps        PWR [facility]) - 05/22/17 1033    PWR! exercises  Moves in standing    PWR! Up  x 10 reps    PWR! Rock  x 10 reps    PWR! Twist  x 3 reps, then 3 reps with just body/trunk rotation, then x 5 reps with head turns (trying to avoid hip pain on R with trunk rotation to that side)    PWR Step  x 10 reps to side    Comments  PT provides verbal and visual cues as reminders of technique and intensity of exercise       Balance Exercises - 05/22/17 1041      Balance Exercises: Standing   Stepping Strategy  Anterior;Posterior UE support for posterior direction    Other Standing Exercises  With posterior step strategy, pt has difficulty with raising toes for dynamic stretch, needing extra cues for correct performance; with forward step and weightshift, pt is consistently lifting heel from ground in forward place foot (?gastroc/heelcord tightness)        PT Education - 05/22/17 2203    Education provided  Yes    Education Details  HEP additions-see instructions    Person(s) Educated  Patient;Spouse    Methods  Explanation;Demonstration;Verbal cues    Comprehension  Verbalized understanding          PT Long Term Goals - 05/18/17 1322      PT LONG TERM GOAL #1   Title  Pt will be independent with Parkinson's specific HEP for improved balance and gait.  Updated target 06/15/17 (due to pt returning for first visit on 05/18/17)    Time  4    Period  Weeks    Status  On-going    Target Date  06/15/17       PT LONG TERM GOAL #2   Title  Pt  will perform 5x sit<>stand in less than 11 seconds for improved transfer efficiency and safety.    Time  4    Period  Weeks    Status  On-going    Target Date  06/15/17      PT LONG TERM GOAL #3   Title  Pt will improve SLS in RLE to >3 seconds for improved obstacle and stair negotiation.    Time  4    Period  Weeks    Status  On-going    Target Date  06/15/17      PT LONG TERM GOAL #4   Title  Pt will verbalize plans for continued community fitness upon d/c from PT.    Time  4    Period  Weeks    Status  On-going    Target Date  06/15/17            Plan - 05/22/17 2204    Clinical Impression Statement  Skilled PT session focused today on review of HEP from last visit, address lumbar flexibility to decrease back/hip pain-pt return demo understanding with cues.  Exercises in therapy today focused on flexibility of gastroc/heelcords (due to apparent limitations of keeping L heel on ground during step and weightshift activities) and PWR! Moves to address posture, weightshifting and stepping activities in varied directions.  Pt will continue to benefit from skilled PT to address balance, gait and functional mobility.    Rehab Potential  Good    PT Frequency  2x / week    PT Duration  4 weeks plus eval    PT Treatment/Interventions  ADLs/Self Care Home Management;Gait training;Stair training;Functional mobility training;Therapeutic activities;Therapeutic exercise;Balance training;Patient/family education;Neuromuscular re-education    PT Next Visit Plan  Review gastroc stretch and PWR! Moves; work on low surface transfers, gait training, SLS activities    Consulted and Agree with Plan of Care  Patient       Patient will benefit from skilled therapeutic intervention in order to improve the following deficits and impairments:  Abnormal gait, Decreased balance, Decreased mobility, Difficulty walking, Postural dysfunction  Visit  Diagnosis: Abnormal posture  Unsteadiness on feet     Problem List Patient Active Problem List   Diagnosis Date Noted  . OSA treated with BiPAP 01/03/2017  . REM sleep behavior disorder 12/06/2016  . Uncontrolled secondary diabetes mellitus with stage 3 CKD (GFR 30-59) (HCC) 10/23/2016  . Medication management 10/23/2016  . Vitamin D deficiency 10/23/2016  . Parkinson disease ([place]) 01/04/2016  . Seasonal and perennial allergic rhinitis 06/21/2014  . Asthma, mild intermittent, well-controlled 06/21/2014  . Pseudomonas aeruginosa colonization 10/08/2012  . Hemorrhage of rectum and anus 07/01/2012  . CAD (coronary artery disease) 11/15/2011  . Hypertension 11/15/2011  . Hyperlipidemia 11/15/2011  . Class 3 obesity due to excess calories with serious comorbidity and body mass index (BMI) of 45.0 to 49.9 in adult 11/15/2011  . Ureteral calculus, right 06/12/2011    Todd Rice W. 05/22/2017, 10:09 PM  Frazier Butt., PT   [facility] [address] [place], [place], [address] Phone: [phone]   Fax:  [phone]  Name: JAME MORRELL MRN: [id number] Date of Birth: 1956/03/17

## 2017-05-24 DIAGNOSIS — G4733 Obstructive sleep apnea (adult) (pediatric): Secondary | ICD-10-CM | POA: Diagnosis not present

## 2017-05-25 ENCOUNTER — Encounter: Payer: Self-pay | Admitting: Physical Therapy

## 2017-05-25 ENCOUNTER — Ambulatory Visit: Payer: 59

## 2017-05-25 ENCOUNTER — Ambulatory Visit: Payer: 59 | Admitting: Physical Therapy

## 2017-05-25 ENCOUNTER — Ambulatory Visit: Payer: 59 | Admitting: Occupational Therapy

## 2017-05-25 DIAGNOSIS — R2689 Other abnormalities of gait and mobility: Secondary | ICD-10-CM

## 2017-05-25 DIAGNOSIS — R2681 Unsteadiness on feet: Secondary | ICD-10-CM

## 2017-05-25 DIAGNOSIS — R29898 Other symptoms and signs involving the musculoskeletal system: Secondary | ICD-10-CM

## 2017-05-25 DIAGNOSIS — R471 Dysarthria and anarthria: Secondary | ICD-10-CM

## 2017-05-25 DIAGNOSIS — R29818 Other symptoms and signs involving the nervous system: Secondary | ICD-10-CM

## 2017-05-25 DIAGNOSIS — R278 Other lack of coordination: Secondary | ICD-10-CM | POA: Diagnosis not present

## 2017-05-25 DIAGNOSIS — R293 Abnormal posture: Secondary | ICD-10-CM

## 2017-05-25 NOTE — Therapy (Signed)
[facility] [address] [place], [place], [address] Phone: [phone]   Fax:  [phone]  Physical Therapy Treatment  Patient Details  Name: Todd Rice MRN: [id number] Date of Birth: [birth year] Referring Provider: Parkinson's disease   Encounter Date: 05/25/2017  PT End of Session - 05/25/17 1336    Visit Number  4    Number of Visits  9    Authorization Type  UMR    PT Start Time  [phone]    PT Stop Time  1015    PT Time Calculation (min)  38 min    Activity Tolerance  Patient tolerated treatment well    Behavior During Therapy  [facility] for tasks assessed/performed       Past Medical History:  Diagnosis Date  . Adult BMI 50.0-59.9 kg/sq m    52.77  . Anal fissure   . Anxiety   . Diabetes mellitus without complication (HCC)    borderline  . GERD (gastroesophageal reflux disease)    30 years ago, maybe from peptic ulcer  . H/O: gout    STABLE  . Hepatic steatosis 10/01/11  . History of kidney stones   . Hyperlipidemia   . Hypertension   . Kidney tumor    right upper kidney tumor, monitoring  . OSA on CPAP   . Parkinson disease ([place]) 01/04/2016  . REM sleep behavior disorder 12/06/2016  . Right ureteral stone   . Vitamin D deficiency   . Weakness     Past Surgical History:  Procedure Laterality Date  . ANAL FISSURE REPAIR  10-12-2000  . COLONOSCOPY N/A 07/01/2012   Procedure: COLONOSCOPY;  Surgeon: Lafayette Dragon, MD;  [facility];  Service: Endoscopy;  Laterality: N/A;  . CYSTOSCOPY WITH RETROGRADE PYELOGRAM, URETEROSCOPY AND STENT PLACEMENT Bilateral 02/26/2013   Procedure: CYSTOSCOPY WITH RETROGRADE PYELOGRAM, URETEROSCOPY AND STENT PLACEMENT;  Surgeon: Alexis Frock, MD;  [facility];  Service: Urology;  Laterality: Bilateral;  . HOLMIUM LASER APPLICATION Bilateral 01/75/1025   Procedure: HOLMIUM LASER APPLICATION;  Surgeon: Alexis Frock, MD;  [facility];  Service: Urology;   Laterality: Bilateral;  . kidney stone removal     06/2011-stent placement  . LEFT MEDIAL FEMORAL CONDYLE DEBRIDEMENT & DRILLING/ REMOVAL LOOSE BODY  09-15-2003  . NASAL SEPTUM SURGERY  1985  . STONE EXTRACTION WITH BASKET  06/12/2011   Procedure: STONE EXTRACTION WITH BASKET;  Surgeon: Claybon Jabs, MD;  Location: [facility];  Service: Urology;  Laterality: Right;    There were no vitals filed for this visit.  Subjective Assessment - 05/25/17 0938    Subjective  Hip feels much better.  Its a little sore, but not bad.  Much better overall.    Patient Stated Goals  Pt's goal for physical therapy is to get looser like I was when I left here last year.    Currently in Pain?  Yes    Pain Score  1     Pain Location  Hip    Pain Orientation  Right    Pain Descriptors / Indicators  Sore    Pain Type  Acute pain    Pain Onset  Today    Pain Frequency  Intermittent    Aggravating Factors   certain movements    Pain Relieving Factors  rest, medications                      [facility] Adult PT Treatment/Exercise -  05/25/17 0001      Transfers   Transfers  Sit to Stand;Stand to Sit    Sit to Stand  6: Modified independent (Device/Increase time);Without upper extremity assist;From bed;From chair/3-in-1    Five time sit to stand comments   10.5    Stand to Sit  6: Modified independent (Device/Increase time);Without upper extremity assist;To bed;To chair/3-in-1    Number of Reps  10 reps;Other sets (comment) from 20", 18", 17", then 17" soft surfaces    Transfer Cueing  Initial cues for forward scoot, foot placement, increased momentum for forward lean if needed-pt able to perform progressively lower surfaces without further cueing      High Level Balance   High Level Balance Comments  Single leg stance activities at step:  forward step up-up/down-down x 10 each leg, alternating step taps x 20 to 6", 12" step, then forward step up x 10 reps each leg with intermittent UE  support      Ankle Exercises: Stretches   Gastroc Stretch  3 reps;30 seconds Reviewed stretch at step-pt return demo understanding        PWR [facility]) - 05/25/17 1000    PWR! exercises  Moves in standing    PWR! Up  x 20 reps    PWR! Rock  x 10 reps each side    PWR Step  x 10 reps each side    Comments  Reviewed HEP with pt return demo understanding.       Balance Exercises - 05/25/17 1004      Balance Exercises: Standing   Stepping Strategy  Anterior;Posterior;10 reps    Other Standing Exercises  Trunk rotation in corner with reaching across body:  up and across x 10, across at shoulder height x 10, then diagonal reach down and then up x 10 reps each side; cues for widened BOS with weightshift to pivot             PT Long Term Goals - 05/18/17 1322      PT LONG TERM GOAL #1   Title  Pt will be independent with Parkinson's specific HEP for improved balance and gait.  Updated target 06/15/17 (due to pt returning for first visit on 05/18/17)    Time  4    Period  Weeks    Status  On-going    Target Date  06/15/17      PT LONG TERM GOAL #2   Title  Pt will perform 5x sit<>stand in less than 11 seconds for improved transfer efficiency and safety.    Time  4    Period  Weeks    Status  On-going    Target Date  06/15/17      PT LONG TERM GOAL #3   Title  Pt will improve SLS in RLE to >3 seconds for improved obstacle and stair negotiation.    Time  4    Period  Weeks    Status  On-going    Target Date  06/15/17      PT LONG TERM GOAL #4   Title  Pt will verbalize plans for continued community fitness upon d/c from PT.    Time  4    Period  Weeks    Status  On-going    Target Date  06/15/17            Plan - 05/25/17 1337    Clinical Impression Statement  Reviewed HEP with pt return demo understanding.  Skilled session also  focused on SLS activities and transfer activities.  Pt needs brief rest breaks between bouts of activity and has brief/slight increase in  pain in R hip, but not really over 1/10 and it goes away upon sitting.  Will continue to benefit from skilled PT to address balance and gait activities.    Rehab Potential  Good    PT Frequency  2x / week    PT Duration  4 weeks plus eval    PT Treatment/Interventions  ADLs/Self Care Home Management;Gait training;Stair training;Functional mobility training;Therapeutic activities;Therapeutic exercise;Balance training;Patient/family education;Neuromuscular re-education    PT Next Visit Plan  Continue SLS activities, trunk rotation, ways to challenge PWR! Moves in [place]! Moves and community fitness options    Consulted and Agree with Plan of Care  Patient       Patient will benefit from skilled therapeutic intervention in order to improve the following deficits and impairments:  Abnormal gait, Decreased balance, Decreased mobility, Difficulty walking, Postural dysfunction  Visit Diagnosis: Unsteadiness on feet  Abnormal posture  Other abnormalities of gait and mobility     Problem List Patient Active Problem List   Diagnosis Date Noted  . OSA treated with BiPAP 01/03/2017  . REM sleep behavior disorder 12/06/2016  . Uncontrolled secondary diabetes mellitus with stage 3 CKD (GFR 30-59) (HCC) 10/23/2016  . Medication management 10/23/2016  . Vitamin D deficiency 10/23/2016  . Parkinson disease ([place]) 01/04/2016  . Seasonal and perennial allergic rhinitis 06/21/2014  . Asthma, mild intermittent, well-controlled 06/21/2014  . Pseudomonas aeruginosa colonization 10/08/2012  . Hemorrhage of rectum and anus 07/01/2012  . CAD (coronary artery disease) 11/15/2011  . Hypertension 11/15/2011  . Hyperlipidemia 11/15/2011  . Class 3 obesity due to excess calories with serious comorbidity and body mass index (BMI) of 45.0 to 49.9 in adult 11/15/2011  . Ureteral calculus, right 06/12/2011    Rashawna Scoles W. 05/25/2017, 1:41 PM  Frazier Butt., PT   [facility] [address] [place], [place], [address] Phone: [phone]   Fax:  [phone]  Name: ERLING ARRAZOLA MRN: [id number] Date of Birth: [birth year]

## 2017-05-25 NOTE — Therapy (Signed)
[facility] [address] [place], [place], [address] Phone: [phone]   Fax:  [phone]  Speech Language Pathology Treatment  Patient Details  Name: Todd Rice MRN: [id number] Date of Birth: 1955/09/19 Referring Provider: Neldon Newport., MD   Encounter Date: 05/25/2017  End of Session - 05/25/17 1612    Visit Number  4    Number of Visits  17    Date for SLP Re-Evaluation  07/05/17    SLP Start Time  0850    SLP Stop Time   0930    SLP Time Calculation (min)  40 min    Activity Tolerance  Patient tolerated treatment well       Past Medical History:  Diagnosis Date  . Adult BMI 50.0-59.9 kg/sq m    52.77  . Anal fissure   . Anxiety   . Diabetes mellitus without complication (HCC)    borderline  . GERD (gastroesophageal reflux disease)    30 years ago, maybe from peptic ulcer  . H/O: gout    STABLE  . Hepatic steatosis 10/01/11  . History of kidney stones   . Hyperlipidemia   . Hypertension   . Kidney tumor    right upper kidney tumor, monitoring  . OSA on CPAP   . Parkinson disease ([place]) 01/04/2016  . REM sleep behavior disorder 12/06/2016  . Right ureteral stone   . Vitamin D deficiency   . Weakness     Past Surgical History:  Procedure Laterality Date  . ANAL FISSURE REPAIR  10-12-2000  . COLONOSCOPY N/A 07/01/2012   Procedure: COLONOSCOPY;  Surgeon: Lafayette Dragon, MD;  [facility];  Service: Endoscopy;  Laterality: N/A;  . CYSTOSCOPY WITH RETROGRADE PYELOGRAM, URETEROSCOPY AND STENT PLACEMENT Bilateral 02/26/2013   Procedure: CYSTOSCOPY WITH RETROGRADE PYELOGRAM, URETEROSCOPY AND STENT PLACEMENT;  Surgeon: Alexis Frock, MD;  [facility];  Service: Urology;  Laterality: Bilateral;  . HOLMIUM LASER APPLICATION Bilateral 76/54/6503   Procedure: HOLMIUM LASER APPLICATION;  Surgeon: Alexis Frock, MD;  [facility];  Service: Urology;  Laterality: Bilateral;  . kidney stone  removal     06/2011-stent placement  . LEFT MEDIAL FEMORAL CONDYLE DEBRIDEMENT & DRILLING/ REMOVAL LOOSE BODY  09-15-2003  . NASAL SEPTUM SURGERY  1985  . STONE EXTRACTION WITH BASKET  06/12/2011   Procedure: STONE EXTRACTION WITH BASKET;  Surgeon: Claybon Jabs, MD;  Location: [facility];  Service: Urology;  Laterality: Right;    There were no vitals filed for this visit.  Subjective Assessment - 05/25/17 0904    Subjective  "I wasn't really thinking about it." (pt, re: loudness in 10 minutes mod complex conversation)    Patient is accompained by:  Family member wife    Currently in Pain?  Yes    Pain Score  1     Pain Location  Hip    Pain Orientation  Right    Pain Descriptors / Indicators  Sore    Pain Type  Acute pain    Pain Onset  In the past 7 days    Pain Frequency  Intermittent    Aggravating Factors   certain movement    Pain Relieving Factors  rest, meds            ADULT SLP TREATMENT - 05/25/17 0921      General Information   Behavior/Cognition  Alert;Cooperative;Pleasant mood      Treatment Provided   Treatment provided  Cognitive-Linquistic  Cognitive-Linquistic Treatment   Treatment focused on  Dysarthria    Skilled Treatment  Loud /a/ average mid 90s dB to recalibrate conversational volume. Simple to mod complex conversation over 25 minutes average of upper 60s-lower 70s dB with rare min A in the last 7 minutes, when conversation took place outside of [place] room..      Assessment / Recommendations / Plan   Plan  Continue with current plan of care      Progression Toward Goals   Progression toward goals  Progressing toward goals       SLP Education - 05/25/17 1611    Education provided  Yes    Education Details  why loud /a/ are important to perform presently    Person(s) Educated  Patient;Spouse    Methods  Explanation    Comprehension  Verbalized understanding       SLP Short Term Goals - 05/25/17 1613      SLP SHORT TERM  GOAL #1   Title  pt will maintain loud /a/ average 94dB with appropriate voicing over 4 sessions    Baseline  05/22/17; 05-25-17    Time  3    Period  Weeks    Status  On-going      SLP SHORT TERM GOAL #2   Title  pt will produce 20/20 sentences with average 70dB over two sessions    Status  Achieved      SLP SHORT TERM GOAL #3   Title  pt will demo simple conversation of 10 minutes with average 70dB over two sessions    Baseline  05-25-17    Time  3    Period  Weeks    Status  On-going       SLP Long Term Goals - 05/25/17 1614      SLP LONG TERM GOAL #1   Title  pt will maintain loud /a/ at 95dB with proper voicing over 5 sessions    Baseline  05-25-17    Time  7    Period  Weeks    Status  On-going      SLP LONG TERM GOAL #2   Title  pt will demo 10 minutes mod complex conversastion with average 70dB over three sessions    Baseline  05-25-17    Time  7    Period  Weeks    Status  On-going      SLP LONG TERM GOAL #3   Title  pt will demo 20 minutes mod complex conversation wiht average 70dB outside [place] room, over two sessions    Time  7    Period  Weeks    Status  Revised       Plan - 05/25/17 1612    Clinical Impression Statement  Pt presents with WNL speech loudness in conversation, with reduced loudness in last 7 minutes. Pt cont to require skilled ST for consistency in maintaining WNL loudness over time. Based upon success today I suspect pt will not need to be seen for > 4 weeks (8 ST sessions)    Speech Therapy Frequency  2x / week    Treatment/Interventions  Cueing hierarchy;SLP instruction and feedback;Compensatory strategies;Functional tasks;Compensatory techniques;Internal/external aids;Patient/family education    Potential to Achieve Goals  Good       Patient will benefit from skilled therapeutic intervention in order to improve the following deficits and impairments:   Dysarthria and anarthria    Problem List Patient Active Problem List   Diagnosis  Date  Noted  . OSA treated with BiPAP 01/03/2017  . REM sleep behavior disorder 12/06/2016  . Uncontrolled secondary diabetes mellitus with stage 3 CKD (GFR 30-59) (HCC) 10/23/2016  . Medication management 10/23/2016  . Vitamin D deficiency 10/23/2016  . Parkinson disease ([place]) 01/04/2016  . Seasonal and perennial allergic rhinitis 06/21/2014  . Asthma, mild intermittent, well-controlled 06/21/2014  . Pseudomonas aeruginosa colonization 10/08/2012  . Hemorrhage of rectum and anus 07/01/2012  . CAD (coronary artery disease) 11/15/2011  . Hypertension 11/15/2011  . Hyperlipidemia 11/15/2011  . Class 3 obesity due to excess calories with serious comorbidity and body mass index (BMI) of 45.0 to 49.9 in adult 11/15/2011  . Ureteral calculus, right 06/12/2011    [facility] ,MS, CCC-SLP  05/25/2017, 4:15 PM  [facility] [address] [place], [place], [address] Phone: [phone]   Fax:  [phone]   Name: Todd Rice MRN: [id number] Date of Birth: 1955/07/24

## 2017-05-25 NOTE — Therapy (Signed)
[facility] [address] [place], [place], [address] Phone: [phone]   Fax:  [phone]  Occupational Therapy Treatment  Patient Details  Name: Todd Rice MRN: [id number] Date of Birth: 10-30-1955 Referring Provider: Parkinson's disease   Encounter Date: 05/25/2017  OT End of Session - 05/25/17 0831    Visit Number  4    Number of Visits  11    Date for OT Re-Evaluation  07/14/17    Authorization Type  [employer]    Authorization Time Period  initally scheduled for 2x week x 4 weeks    OT Start Time  0804    OT Stop Time  0845    OT Time Calculation (min)  41 min    Activity Tolerance  Patient tolerated treatment well    Behavior During Therapy  [facility] for tasks assessed/performed       Past Medical History:  Diagnosis Date  . Adult BMI 50.0-59.9 kg/sq m    52.77  . Anal fissure   . Anxiety   . Diabetes mellitus without complication (HCC)    borderline  . GERD (gastroesophageal reflux disease)    30 years ago, maybe from peptic ulcer  . H/O: gout    STABLE  . Hepatic steatosis 10/01/11  . History of kidney stones   . Hyperlipidemia   . Hypertension   . Kidney tumor    right upper kidney tumor, monitoring  . OSA on CPAP   . Parkinson disease ([place]) 01/04/2016  . REM sleep behavior disorder 12/06/2016  . Right ureteral stone   . Vitamin D deficiency   . Weakness     Past Surgical History:  Procedure Laterality Date  . ANAL FISSURE REPAIR  10-12-2000  . COLONOSCOPY N/A 07/01/2012   Procedure: COLONOSCOPY;  Surgeon: Lafayette Dragon, MD;  [facility]: [facility];  Service: Endoscopy;  Laterality: N/A;  . CYSTOSCOPY WITH RETROGRADE PYELOGRAM, URETEROSCOPY AND STENT PLACEMENT Bilateral 02/26/2013   Procedure: CYSTOSCOPY WITH RETROGRADE PYELOGRAM, URETEROSCOPY AND STENT PLACEMENT;  Surgeon: Alexis Frock, MD;  Location: [facility];  Service: Urology;  Laterality: Bilateral;  . HOLMIUM LASER APPLICATION  Bilateral 13/24/4010   Procedure: HOLMIUM LASER APPLICATION;  Surgeon: Alexis Frock, MD;  Location: [facility];  Service: Urology;  Laterality: Bilateral;  . kidney stone removal     06/2011-stent placement  . LEFT MEDIAL FEMORAL CONDYLE DEBRIDEMENT & DRILLING/ REMOVAL LOOSE BODY  09-15-2003  . NASAL SEPTUM SURGERY  1985  . STONE EXTRACTION WITH BASKET  06/12/2011   Procedure: STONE EXTRACTION WITH BASKET;  Surgeon: Claybon Jabs, MD;  Location: [facility];  Service: Urology;  Laterality: Right;    There were no vitals filed for this visit.  Subjective Assessment - 05/25/17 1204    Subjective   Pt reports his pain is improved    Pertinent History  PD HLD, HTN, DM, obesity    Patient Stated Goals  improve function in right hand    Currently in Pain?  Yes    Pain Score  1     Pain Location  Hip    Pain Orientation  Right    Pain Descriptors / Indicators  Sore    Pain Type  Acute pain    Pain Onset  Today    Pain Frequency  Intermittent    Aggravating Factors   certain movements    Pain Relieving Factors  rest, meds  Treatment: Reveiwed PWR! Basic 4 modified quadraped over table, 10-20 reps each, min v.c Dynamic step and reach to toss scarves to targets with large amplitude movements, occasional v.c Arm bike x 5 mins level 1 , for conditioning pt maintained 40 rpm Handwriting strategies and practice using foam grip, min v.c Pt wrote 3 sentences with good legibility and letter size.                    OT Long Term Goals - 04/20/17 1119      OT LONG TERM GOAL #1   Title  Pt will demonstrate understanding of PD specific HEP following review.    Time  5    Period  Weeks    Status  New      OT LONG TERM GOAL #2   Title  Pt will verbalize understanding of updated adapted strategies for ADLs/IADLs,    Time  5    Period  Weeks    Status  New      OT LONG TERM GOAL #3   Title  Pt will demonstrate improved fine motor coordination for  ADLS as evidenced by decreasing RUE 9 hole peg test score to 36 secs or less.    Baseline  RUE 41.75 secs, LUE 25.13    Time  5    Period  Weeks    Status  New      OT LONG TERM GOAL #4   Title  Pt will verbalize understanding of ways to prevent future complications and appropriate community resources    Time  5    Period  Weeks    Status  New      OT LONG TERM GOAL #5   Title  Pt will write a short paragraph with good legibility and no signifiacnt decrease in letter size.    Time  5    Period  Weeks    Status  New            Plan - 05/25/17 7062    Clinical Impression Statement  Pt is progressing towards goals. He demonstrates improving fine motor coordination. Pt's wife is ordering AE for dressing.    Rehab Potential  Good    OT Frequency  -- 11 visits    OT Treatment/Interventions  Self-care/ADL training;Moist Heat;Fluidtherapy;DME and/or AE instruction;Balance training;Therapeutic activities;Cognitive remediation/compensation;Therapeutic exercise;Passive range of motion;Functional Mobility Training;Neuromuscular education;Cryotherapy;Paraffin;Energy conservation;Manual Therapy;Patient/family education    Plan  big movements with ADLS, fine motor coordination,        Patient will benefit from skilled therapeutic intervention in order to improve the following deficits and impairments:  Abnormal gait, Decreased range of motion, Difficulty walking, Impaired flexibility, Decreased safety awareness, Decreased endurance, Decreased activity tolerance, Decreased knowledge of precautions, Impaired tone, Impaired UE functional use, Pain, Decreased balance, Decreased cognition, Decreased mobility, Decreased strength  Visit Diagnosis: Other symptoms and signs involving the nervous system  Other lack of coordination  Other symptoms and signs involving the musculoskeletal system  Abnormal posture    Problem List Patient Active Problem List   Diagnosis Date Noted  . OSA treated  with BiPAP 01/03/2017  . REM sleep behavior disorder 12/06/2016  . Uncontrolled secondary diabetes mellitus with stage 3 CKD (GFR 30-59) (HCC) 10/23/2016  . Medication management 10/23/2016  . Vitamin D deficiency 10/23/2016  . Parkinson disease ([place]) 01/04/2016  . Seasonal and perennial allergic rhinitis 06/21/2014  . Asthma, mild intermittent, well-controlled 06/21/2014  . Pseudomonas aeruginosa colonization 10/08/2012  . Hemorrhage of  rectum and anus 07/01/2012  . CAD (coronary artery disease) 11/15/2011  . Hypertension 11/15/2011  . Hyperlipidemia 11/15/2011  . Class 3 obesity due to excess calories with serious comorbidity and body mass index (BMI) of 45.0 to 49.9 in adult 11/15/2011  . Ureteral calculus, right 06/12/2011    RINE,KATHRYN 05/25/2017, 12:05 PM  [facility] [address] [place], [place], [address] Phone: [phone]   Fax:  [phone]  Name: Todd Rice MRN: [id number] Date of Birth: Dec 10, 1955

## 2017-05-29 ENCOUNTER — Ambulatory Visit: Payer: 59 | Admitting: Speech Pathology

## 2017-05-29 ENCOUNTER — Ambulatory Visit: Payer: 59 | Admitting: Physical Therapy

## 2017-05-29 ENCOUNTER — Ambulatory Visit: Payer: 59 | Admitting: Occupational Therapy

## 2017-05-29 ENCOUNTER — Encounter: Payer: Self-pay | Admitting: Physical Therapy

## 2017-05-29 DIAGNOSIS — R29898 Other symptoms and signs involving the musculoskeletal system: Secondary | ICD-10-CM | POA: Diagnosis not present

## 2017-05-29 DIAGNOSIS — R278 Other lack of coordination: Secondary | ICD-10-CM | POA: Diagnosis not present

## 2017-05-29 DIAGNOSIS — R293 Abnormal posture: Secondary | ICD-10-CM | POA: Diagnosis not present

## 2017-05-29 DIAGNOSIS — R2681 Unsteadiness on feet: Secondary | ICD-10-CM

## 2017-05-29 DIAGNOSIS — R471 Dysarthria and anarthria: Secondary | ICD-10-CM | POA: Diagnosis not present

## 2017-05-29 DIAGNOSIS — R2689 Other abnormalities of gait and mobility: Secondary | ICD-10-CM

## 2017-05-29 DIAGNOSIS — R29818 Other symptoms and signs involving the nervous system: Secondary | ICD-10-CM

## 2017-05-29 NOTE — Therapy (Signed)
[facility] [address] [place], [place], [address] Phone: [phone]   Fax:  [phone]  Speech Language Pathology Treatment  Patient Details  Name: Todd Rice MRN: [id number] Date of Birth: 18-Feb-1956 Referring Provider: Neldon Newport., MD   Encounter Date: 05/29/2017  End of Session - 05/29/17 0928    Visit Number  5    Number of Visits  17    Date for SLP Re-Evaluation  07/05/17    SLP Start Time  0846    SLP Stop Time   0925    SLP Time Calculation (min)  39 min    Activity Tolerance  Patient tolerated treatment well       Past Medical History:  Diagnosis Date  . Adult BMI 50.0-59.9 kg/sq m    52.77  . Anal fissure   . Anxiety   . Diabetes mellitus without complication (HCC)    borderline  . GERD (gastroesophageal reflux disease)    30 years ago, maybe from peptic ulcer  . H/O: gout    STABLE  . Hepatic steatosis 10/01/11  . History of kidney stones   . Hyperlipidemia   . Hypertension   . Kidney tumor    right upper kidney tumor, monitoring  . OSA on CPAP   . Parkinson disease ([place]) 01/04/2016  . REM sleep behavior disorder 12/06/2016  . Right ureteral stone   . Vitamin D deficiency   . Weakness     Past Surgical History:  Procedure Laterality Date  . ANAL FISSURE REPAIR  10-12-2000  . COLONOSCOPY N/A 07/01/2012   Procedure: COLONOSCOPY;  Surgeon: Lafayette Dragon, MD;  [facility];  Service: Endoscopy;  Laterality: N/A;  . CYSTOSCOPY WITH RETROGRADE PYELOGRAM, URETEROSCOPY AND STENT PLACEMENT Bilateral 02/26/2013   Procedure: CYSTOSCOPY WITH RETROGRADE PYELOGRAM, URETEROSCOPY AND STENT PLACEMENT;  Surgeon: Alexis Frock, MD;  [facility];  Service: Urology;  Laterality: Bilateral;  . HOLMIUM LASER APPLICATION Bilateral 56/81/2751   Procedure: HOLMIUM LASER APPLICATION;  Surgeon: Alexis Frock, MD;  [facility];  Service: Urology;  Laterality: Bilateral;  . kidney stone  removal     06/2011-stent placement  . LEFT MEDIAL FEMORAL CONDYLE DEBRIDEMENT & DRILLING/ REMOVAL LOOSE BODY  09-15-2003  . NASAL SEPTUM SURGERY  1985  . STONE EXTRACTION WITH BASKET  06/12/2011   Procedure: STONE EXTRACTION WITH BASKET;  Surgeon: Claybon Jabs, MD;  Location: [facility];  Service: Urology;  Laterality: Right;    There were no vitals filed for this visit.  Subjective Assessment - 05/29/17 0847    Subjective  "I guess she's hearing me"    Patient is accompained by:  Family member wife    Currently in Pain?  Yes    Pain Score  1     Pain Location  Hip    Pain Orientation  Right    Pain Descriptors / Indicators  Sore    Pain Type  Acute pain    Pain Onset  Today    Pain Frequency  Intermittent            ADULT SLP TREATMENT - 05/29/17 0850      General Information   Behavior/Cognition  Alert;Cooperative;Pleasant mood      Treatment Provided   Treatment provided  Cognitive-Linquistic      Cognitive-Linquistic Treatment   Treatment focused on  Dysarthria    Skilled Treatment  Loud /a to recalibrate volume mid 90's dB with mod I. Structured  speech tasks with averate of 72dB. Moderately complex conversation over 18 minutes pt maintained volume averate of 70dB with occasional min verbal and visual cues.       Assessment / Recommendations / Plan   Plan  Continue with current plan of care      Progression Toward Goals   Progression toward goals  Progressing toward goals         SLP Short Term Goals - 05/29/17 0926      SLP SHORT TERM GOAL #1   Title  pt will maintain loud /a/ average 94dB with appropriate voicing over 4 sessions    Baseline  05/22/17; 05-25-17; 05/29/16    Time  3    Period  Weeks    Status  On-going      SLP SHORT TERM GOAL #2   Title  pt will produce 20/20 sentences with average 70dB over two sessions    Baseline  05/22/17; 05/29/16    Status  Achieved      SLP SHORT TERM GOAL #3   Title  pt will demo simple  conversation of 10 minutes with average 70dB over two sessions    Baseline  05-25-17; 05/29/16    Time  3    Period  Weeks    Status  Achieved       SLP Long Term Goals - 05/29/17 6160      SLP LONG TERM GOAL #1   Title  pt will maintain loud /a/ at 95dB with proper voicing over 5 sessions    Baseline  05-25-17; 05/29/17    Time  6    Period  Weeks    Status  On-going      SLP LONG TERM GOAL #2   Title  pt will demo 10 minutes mod complex conversastion with average 70dB over three sessions    Baseline  05-25-17; 05/29/17    Time  6    Period  Weeks    Status  On-going      SLP LONG TERM GOAL #3   Title  pt will demo 20 minutes mod complex conversation wiht average 70dB outside Gerty room, over two sessions    Time  6    Period  Weeks    Status  Revised       Plan - 05/29/17 7371    Clinical Impression Statement  Pt presents with WNL speech loudness in conversation, with occasional  min A voer 15 minutes. Pt cont to require skilled ST for consistency in maintaining WNL loudness over time. Based upon success today I suspect pt will not need to be seen for > 4 weeks (8 ST sessions)    Speech Therapy Frequency  2x / week    Treatment/Interventions  Cueing hierarchy;SLP instruction and feedback;Compensatory strategies;Functional tasks;Compensatory techniques;Internal/external aids;Patient/family education    Potential to Achieve Goals  Good       Patient will benefit from skilled therapeutic intervention in order to improve the following deficits and impairments:   Dysarthria and anarthria    Problem List Patient Active Problem List   Diagnosis Date Noted  . OSA treated with BiPAP 01/03/2017  . REM sleep behavior disorder 12/06/2016  . Uncontrolled secondary diabetes mellitus with stage 3 CKD (GFR 30-59) (HCC) 10/23/2016  . Medication management 10/23/2016  . Vitamin D deficiency 10/23/2016  . Parkinson disease ([place]) 01/04/2016  . Seasonal and perennial allergic rhinitis  06/21/2014  . Asthma, mild intermittent, well-controlled 06/21/2014  . Pseudomonas aeruginosa colonization 10/08/2012  .  Hemorrhage of rectum and anus 07/01/2012  . CAD (coronary artery disease) 11/15/2011  . Hypertension 11/15/2011  . Hyperlipidemia 11/15/2011  . Class 3 obesity due to excess calories with serious comorbidity and body mass index (BMI) of 45.0 to 49.9 in adult 11/15/2011  . Ureteral calculus, right 06/12/2011    Khylen Riolo, Annye Rusk MS, CCC-SLP 05/29/2017, 9:28 AM  [facility] [address] [place], [place], [address] Phone: [phone]   Fax:  [phone]   Name: NORA ROOKE MRN: [id number] Date of Birth: 04-Aug-1955

## 2017-05-29 NOTE — Therapy (Signed)
[facility] [address] [place], [place], [address] Phone: [phone]   Fax:  [phone]  Physical Therapy Treatment  Patient Details  Name: Todd Rice MRN: [id number] Date of Birth: 09-10-1955 Referring Provider: Parkinson's disease   Encounter Date: 05/29/2017  PT End of Session - 05/29/17 1847    Visit Number  5    Number of Visits  9    Authorization Type  [employer]    PT Start Time  0936    PT Stop Time  1014    PT Time Calculation (min)  38 min    Activity Tolerance  Patient tolerated treatment well Reports hip pain as 0/10 at end of session    Behavior During Therapy  [facility] for tasks assessed/performed       Past Medical History:  Diagnosis Date  . Adult BMI 50.0-59.9 kg/sq m    52.77  . Anal fissure   . Anxiety   . Diabetes mellitus without complication (HCC)    borderline  . GERD (gastroesophageal reflux disease)    30 years ago, maybe from peptic ulcer  . H/O: gout    STABLE  . Hepatic steatosis 10/01/11  . History of kidney stones   . Hyperlipidemia   . Hypertension   . Kidney tumor    right upper kidney tumor, monitoring  . OSA on CPAP   . Parkinson disease ([place]) 01/04/2016  . REM sleep behavior disorder 12/06/2016  . Right ureteral stone   . Vitamin D deficiency   . Weakness     Past Surgical History:  Procedure Laterality Date  . ANAL FISSURE REPAIR  10-12-2000  . COLONOSCOPY N/A 07/01/2012   Procedure: COLONOSCOPY;  Surgeon: Lafayette Dragon, MD;  [facility];  Service: Endoscopy;  Laterality: N/A;  . CYSTOSCOPY WITH RETROGRADE PYELOGRAM, URETEROSCOPY AND STENT PLACEMENT Bilateral 02/26/2013   Procedure: CYSTOSCOPY WITH RETROGRADE PYELOGRAM, URETEROSCOPY AND STENT PLACEMENT;  Surgeon: Alexis Frock, MD;  [facility];  Service: Urology;  Laterality: Bilateral;  . HOLMIUM LASER APPLICATION Bilateral 50/56/9794   Procedure: HOLMIUM LASER APPLICATION;  Surgeon: Alexis Frock,  MD;  [facility]: [facility];  Service: Urology;  Laterality: Bilateral;  . kidney stone removal     06/2011-stent placement  . LEFT MEDIAL FEMORAL CONDYLE DEBRIDEMENT & DRILLING/ REMOVAL LOOSE BODY  09-15-2003  . NASAL SEPTUM SURGERY  1985  . STONE EXTRACTION WITH BASKET  06/12/2011   Procedure: STONE EXTRACTION WITH BASKET;  Surgeon: Claybon Jabs, MD;  Location: [facility];  Service: Urology;  Laterality: Right;    There were no vitals filed for this visit.  Subjective Assessment - 05/29/17 0937    Subjective  Fully done with prednisone, and pain is 0-2, based on how I turn.    Patient Stated Goals  Pt's goal for physical therapy is to get looser like I was when I left here last year.    Currently in Pain?  Yes    Pain Score  2     Pain Location  Hip    Pain Orientation  Right    Pain Descriptors / Indicators  Sore    Pain Type  Acute pain    Pain Onset  Today    Pain Frequency  Intermittent    Aggravating Factors   putting too much weight on that hip    Pain Relieving Factors  rest, meds  [facility] Adult PT Treatment/Exercise - 05/29/17 0001      High Level Balance   High Level Balance Comments  Single leg stance activities at step:  forward step up-up/down-down x 10 each leg, alternating step taps x 20 to 6", 12" step, then forward step tap>reach leg back x 10 reps each leg with intermittent UE support.  Side step taps x 10 reps each leg.      Self-Care   Self-Care  Other Self-Care Comments    Other Self-Care Comments   Discussed community fitness options from which patient may benefit, in particular recommend patient observe/try [employer] Class.  Discussed optimal fitness program for people with PD, including HEP from therapy, walking program, and aerobic activity; provided suggestions for ways to increase walking and increase aerobic activities as part of exercise routine.  Pt inquired about pool/water exercise and provided suggestions  for looking into [facility], [facility] or [employer].        [facility]WR [facility]) - 05/29/17 1839    PWR! exercises  Moves in standing;Functional moves    PWR! Up  x 10 reps    PWR! Rock  x 10 reps each side    PWR! Twist  x 10 reps each side (modified as cross-body reach)    PWR Step  x 10 reps each side    Basic 4 Flow  PWR! Moves Flow in standing:  performed 10 reps:  PWR! UP>PWR! Rock>PWR! Twist (modified with cross body reach)>PWR! Step with verbal and visual cues.    Comments  Basic 4 PWR! Moves performed above standing on foam surface, for increased dynamic balance component    PWR! Sit to Stand  Sit<>stand followed by PWR! Up x 5 reps, Sit<>stand followed by PWR! Rock x 5 reps; Sit<>stand followed by [employer]! Twist (cross body reach) x 5 reps, then sit<>stand followed by PWR! Step x 5 reps        Balance Exercises - 05/29/17 1843      Balance Exercises: Standing   Other Standing Exercises  Trunk rotation in corner with reaching across body:  up and across x 10, across at shoulder height x 10, then diagonal reach down and then up x 5 reps each side; cues for widened BOS with weightshift to pivot        PT Education - 05/29/17 1846    Education provided  Yes    Education Details  Optimal Fitness program for people with PD; options for aerobic activity, pool exercises, walking as part of exercise; provided information on [employer] Class and recommended patient observe    Person(s) Educated  Patient;Spouse    Methods  Explanation;Handout    Comprehension  Verbalized understanding          PT Long Term Goals - 05/18/17 1322      PT LONG TERM GOAL #1   Title  Pt will be independent with Parkinson's specific HEP for improved balance and gait.  Updated target 06/15/17 (due to pt returning for first visit on 05/18/17)    Time  4    Period  Weeks    Status  On-going    Target Date  06/15/17      PT LONG TERM GOAL #2   Title  Pt will perform 5x sit<>stand in less than 11 seconds for  improved transfer efficiency and safety.    Time  4    Period  Weeks    Status  On-going    Target Date  06/15/17  PT LONG TERM GOAL #3   Title  Pt will improve SLS in RLE to >3 seconds for improved obstacle and stair negotiation.    Time  4    Period  Weeks    Status  On-going    Target Date  06/15/17      PT LONG TERM GOAL #4   Title  Pt will verbalize plans for continued community fitness upon d/c from PT.    Time  4    Period  Weeks    Status  On-going    Target Date  06/15/17            Plan - 05/29/17 1848    Clinical Impression Statement  Skilled PT session today focused on balance activities, ways to vary/progress PWR! Moves at home, and education on optimal ongoing fitness program upon d/c from PT.  Pt able to perform exercises throughout the session with minimal to no c/o regarding R hip, and reports pain as 0/10 at end of session.  Able to perform trunk rotation activities, modified by reaching up and across body to incorporate weightshifting and trunk rotation.  Pt will continue to benefit from skilled PT to address balance and gait activities.    Rehab Potential  Good    PT Frequency  2x / week    PT Duration  4 weeks plus eval    PT Treatment/Interventions  ADLs/Self Care Home Management;Gait training;Stair training;Functional mobility training;Therapeutic activities;Therapeutic exercise;Balance training;Patient/family education;Neuromuscular re-education    PT Next Visit Plan  Continue SLS activities, trunk rotation, Review HEP as varied options to challenge PWR! Moves in standing; review community fitness options    Consulted and Agree with Plan of Care  Patient;Family member/caregiver    Family Member Consulted  wife       Patient will benefit from skilled therapeutic intervention in order to improve the following deficits and impairments:  Abnormal gait, Decreased balance, Decreased mobility, Difficulty walking, Postural dysfunction  Visit  Diagnosis: Unsteadiness on feet  Abnormal posture     Problem List Patient Active Problem List   Diagnosis Date Noted  . OSA treated with BiPAP 01/03/2017  . REM sleep behavior disorder 12/06/2016  . Uncontrolled secondary diabetes mellitus with stage 3 CKD (GFR 30-59) (HCC) 10/23/2016  . Medication management 10/23/2016  . Vitamin D deficiency 10/23/2016  . Parkinson disease ([place]) 01/04/2016  . Seasonal and perennial allergic rhinitis 06/21/2014  . Asthma, mild intermittent, well-controlled 06/21/2014  . Pseudomonas aeruginosa colonization 10/08/2012  . Hemorrhage of rectum and anus 07/01/2012  . CAD (coronary artery disease) 11/15/2011  . Hypertension 11/15/2011  . Hyperlipidemia 11/15/2011  . Class 3 obesity due to excess calories with serious comorbidity and body mass index (BMI) of 45.0 to 49.9 in adult 11/15/2011  . Ureteral calculus, right 06/12/2011    MARRIOTT,AMY W. 05/29/2017, 6:53 PM  Frazier Butt., PT   [facility] [address] [place], [place], [address] Phone: [phone]   Fax:  [phone]  Name: RONEL RODEHEAVER MRN: [id number] Date of Birth: 09-15-1955

## 2017-05-29 NOTE — Patient Instructions (Signed)
Optimal Fitness Program after Therapy for People with Parkinson's Disease  1)  Therapy Home Exercise Program  -Do these Exercises DAILY as instructed by your therapist  -Big, deliberate effort with exercises  -These exercises are important to perform consistently, even when therapist has  finished, because these therapy exercises often address your specific  Parkinson's difficulties   2)  Walking  -  Work up to walking 3-5 times per week, 20-30 minutes per day  -This can be done at home, driveway, quiet street or an indoor track  -Focus should be on your Best posture, arm swing, step length for your best  walking pattern  3)  Aerobic Exercise  -Work up to 3-5 times per week, 30 minutes per day  -This can be stationary bike, seated stepper machine, elliptical machine  -Work up to 7-8/10 intensity during the exercise, at minimal to moderate     Resistance      Homework:  Look into options for gym, community center [facility])-especially if you want to look into pools -Options for [place]! MOVES IN STANDING: -PWR! FLOW:  PWR! UP>PWR! ROCK (SAME SIDE REACH)>PWR! TWIST (REACH ACROSS)>PWR! STEP  -X 10 REPS   -SIT<>STAND FOLLOWED BY PWR! UP X 5 REPS -SIT<>STAND FOLLOWED BY PWR! ROCK X 5 REPS -SIT<>STAND FOLLOWED BY PWR! TWIST X 5 REPS -SIT<>STAND FOLLOWED BY PWR! STEP X 5 REPS

## 2017-05-29 NOTE — Therapy (Signed)
[facility] [address] [place], [place], [address] Phone: [phone]   Fax:  [phone]  Occupational Therapy Treatment  Patient Details  Name: Todd Rice MRN: [id number] Date of Birth: 1955/09/23 Referring Provider: Parkinson's disease   Encounter Date: 05/29/2017  OT End of Session - 05/29/17 1250    Visit Number  5    Number of Visits  11    Date for OT Re-Evaluation  07/14/17    Authorization Type  [employer]    Authorization Time Period  initally scheduled for 2x week x 4 weeks    OT Start Time  0803    OT Stop Time  0845    OT Time Calculation (min)  42 min       Past Medical History:  Diagnosis Date  . Adult BMI 50.0-59.9 kg/sq m    52.77  . Anal fissure   . Anxiety   . Diabetes mellitus without complication (HCC)    borderline  . GERD (gastroesophageal reflux disease)    30 years ago, maybe from peptic ulcer  . H/O: gout    STABLE  . Hepatic steatosis 10/01/11  . History of kidney stones   . Hyperlipidemia   . Hypertension   . Kidney tumor    right upper kidney tumor, monitoring  . OSA on CPAP   . Parkinson disease ([place]) 01/04/2016  . REM sleep behavior disorder 12/06/2016  . Right ureteral stone   . Vitamin D deficiency   . Weakness     Past Surgical History:  Procedure Laterality Date  . ANAL FISSURE REPAIR  10-12-2000  . COLONOSCOPY N/A 07/01/2012   Procedure: COLONOSCOPY;  Surgeon: Lafayette Dragon, MD;  Location: [facility];  Service: Endoscopy;  Laterality: N/A;  . CYSTOSCOPY WITH RETROGRADE PYELOGRAM, URETEROSCOPY AND STENT PLACEMENT Bilateral 02/26/2013   Procedure: CYSTOSCOPY WITH RETROGRADE PYELOGRAM, URETEROSCOPY AND STENT PLACEMENT;  Surgeon: Alexis Frock, MD;  Location: [facility];  Service: Urology;  Laterality: Bilateral;  . HOLMIUM LASER APPLICATION Bilateral 42/70/6237   Procedure: HOLMIUM LASER APPLICATION;  Surgeon: Alexis Frock, MD;  Location: [facility];  Service:  Urology;  Laterality: Bilateral;  . kidney stone removal     06/2011-stent placement  . LEFT MEDIAL FEMORAL CONDYLE DEBRIDEMENT & DRILLING/ REMOVAL LOOSE BODY  09-15-2003  . NASAL SEPTUM SURGERY  1985  . STONE EXTRACTION WITH BASKET  06/12/2011   Procedure: STONE EXTRACTION WITH BASKET;  Surgeon: Claybon Jabs, MD;  Location: [facility];  Service: Urology;  Laterality: Right;    There were no vitals filed for this visit.  Subjective Assessment - 05/29/17 1253    Pertinent History  PD HLD, HTN, DM, obesity    Patient Stated Goals  improve function in right hand    Currently in Pain?  Yes    Pain Score  2     Pain Location  Hip    Pain Orientation  Right    Pain Descriptors / Indicators  Sore    Pain Type  Acute pain    Pain Onset  Today    Pain Frequency  Intermittent    Aggravating Factors   putting too much weight on that hip    Pain Relieving Factors  rest, meds              Treatment: PWR! up, rock and twist in supine, 10-20 reps each( discontinued PWR! step due to hip pain) Standing dynamic step and reach to flip large  playing cards min v.c, then copying small peg design with dynamic step and reach, min v.c for larger amplitude movements. Arm bike level 1 x 5 mins for conditioning, pt maintained 40 rpm Multi directional stepping to follow directions, min difficulty/ v.c Amb while dual tasking, min v.c                   OT Long Term Goals - 05/25/17 1208      OT LONG TERM GOAL #1   Title  Pt will demonstrate understanding of PD specific HEP following review.    Time  5    Period  Weeks    Status  New      OT LONG TERM GOAL #2   Title  Pt will verbalize understanding of updated adapted strategies for ADLs/IADLs,    Time  5    Period  Weeks    Status  New      OT LONG TERM GOAL #3   Title  Pt will demonstrate improved fine motor coordination for ADLS as evidenced by decreasing RUE 9 hole peg test score to 36 secs or less.     Baseline  RUE 41.75 secs, LUE 25.13    Time  5    Period  Weeks    Status  New      OT LONG TERM GOAL #4   Title  Pt will verbalize understanding of ways to prevent future complications and appropriate community resources    Time  5    Period  Weeks    Status  New      OT LONG TERM GOAL #5   Title  Pt will write a short paragraph with good legibility and no significant decrease in letter size.    Time  5    Period  Weeks    Status  On-going            Plan - 05/29/17 1251    Clinical Impression Statement  Pt is progressing towards goals. He responds well to cues for big movements with ADLs..    Rehab Potential  Good    Current Impairments/barriers affecting progress:  bradykinesia, rigidity    OT Frequency  -- 11 visits    OT Duration  8 weeks    OT Treatment/Interventions  Self-care/ADL training;Moist Heat;Fluidtherapy;DME and/or AE instruction;Balance training;Therapeutic activities;Cognitive remediation/compensation;Therapeutic exercise;Passive range of motion;Functional Mobility Training;Neuromuscular education;Cryotherapy;Paraffin;Energy conservation;Manual Therapy;Patient/family education    Plan  big movements with ADLS, fine motor coordination,     Consulted and Agree with Plan of Care  Patient;Family member/caregiver       Patient will benefit from skilled therapeutic intervention in order to improve the following deficits and impairments:  Abnormal gait, Decreased range of motion, Difficulty walking, Impaired flexibility, Decreased safety awareness, Decreased endurance, Decreased activity tolerance, Decreased knowledge of precautions, Impaired tone, Impaired UE functional use, Pain, Decreased balance, Decreased cognition, Decreased mobility, Decreased strength  Visit Diagnosis: Abnormal posture  Other abnormalities of gait and mobility  Other symptoms and signs involving the nervous system  Other lack of coordination    Problem List Patient Active Problem  List   Diagnosis Date Noted  . OSA treated with BiPAP 01/03/2017  . REM sleep behavior disorder 12/06/2016  . Uncontrolled secondary diabetes mellitus with stage 3 CKD (GFR 30-59) (HCC) 10/23/2016  . Medication management 10/23/2016  . Vitamin D deficiency 10/23/2016  . Parkinson disease ([place]) 01/04/2016  . Seasonal and perennial allergic rhinitis 06/21/2014  . Asthma, mild intermittent, well-controlled  06/21/2014  . Pseudomonas aeruginosa colonization 10/08/2012  . Hemorrhage of rectum and anus 07/01/2012  . CAD (coronary artery disease) 11/15/2011  . Hypertension 11/15/2011  . Hyperlipidemia 11/15/2011  . Class 3 obesity due to excess calories with serious comorbidity and body mass index (BMI) of 45.0 to 49.9 in adult 11/15/2011  . Ureteral calculus, right 06/12/2011    Ester Mabe 05/29/2017, 12:57 PM  [facility] [address] [place], [place], [address] Phone: [phone]   Fax:  [phone]  Name: Todd Rice MRN: [id number] Date of Birth: [birth year]

## 2017-06-01 ENCOUNTER — Ambulatory Visit: Payer: 59

## 2017-06-01 ENCOUNTER — Ambulatory Visit: Payer: 59 | Admitting: Occupational Therapy

## 2017-06-01 ENCOUNTER — Encounter: Payer: Self-pay | Admitting: Occupational Therapy

## 2017-06-01 ENCOUNTER — Ambulatory Visit: Payer: 59 | Admitting: Physical Therapy

## 2017-06-01 ENCOUNTER — Encounter: Payer: Self-pay | Admitting: Physical Therapy

## 2017-06-01 ENCOUNTER — Telehealth: Payer: Self-pay | Admitting: *Deleted

## 2017-06-01 DIAGNOSIS — R293 Abnormal posture: Secondary | ICD-10-CM

## 2017-06-01 DIAGNOSIS — R2689 Other abnormalities of gait and mobility: Secondary | ICD-10-CM | POA: Diagnosis not present

## 2017-06-01 DIAGNOSIS — R29898 Other symptoms and signs involving the musculoskeletal system: Secondary | ICD-10-CM

## 2017-06-01 DIAGNOSIS — R29818 Other symptoms and signs involving the nervous system: Secondary | ICD-10-CM | POA: Diagnosis not present

## 2017-06-01 DIAGNOSIS — R2681 Unsteadiness on feet: Secondary | ICD-10-CM

## 2017-06-01 DIAGNOSIS — R471 Dysarthria and anarthria: Secondary | ICD-10-CM

## 2017-06-01 DIAGNOSIS — R278 Other lack of coordination: Secondary | ICD-10-CM | POA: Diagnosis not present

## 2017-06-01 NOTE — Telephone Encounter (Signed)
Patients wife stopped in with a form for Dr. [name] to sign.  It is for the [facility].  States he also needs another prescription of the Prednisone.  He is still in pain.  She wants the RX sent to [facility] and wants to pick up the completed form at 11:00 today when they are finished with PT.

## 2017-06-01 NOTE — Telephone Encounter (Signed)
[employer] Form placed on [employer]

## 2017-06-01 NOTE — Telephone Encounter (Signed)
Placed completed/signed form up front for pick up.   

## 2017-06-01 NOTE — Therapy (Signed)
[facility] [address] [place], [place], [address] Phone: [phone]   Fax:  [phone]  Occupational Therapy Treatment  Patient Details  Name: Todd Rice MRN: [id number] Date of Birth: 15-Apr-1956 Referring Provider: Parkinson's disease   Encounter Date: 06/01/2017  OT End of Session - 06/01/17 0845    Visit Number  6    Number of Visits  11    Date for OT Re-Evaluation  07/14/17    Authorization Type  [employer]    Authorization Time Period  initally scheduled for 2x week x 4 weeks    OT Start Time  0935    OT Stop Time  1017    OT Time Calculation (min)  42 min    Activity Tolerance  Patient tolerated treatment well    Behavior During Therapy  [facility] for tasks assessed/performed       Past Medical History:  Diagnosis Date  . Adult BMI 50.0-59.9 kg/sq m    52.77  . Anal fissure   . Anxiety   . Diabetes mellitus without complication (HCC)    borderline  . GERD (gastroesophageal reflux disease)    30 years ago, maybe from peptic ulcer  . H/O: gout    STABLE  . Hepatic steatosis 10/01/11  . History of kidney stones   . Hyperlipidemia   . Hypertension   . Kidney tumor    right upper kidney tumor, monitoring  . OSA on CPAP   . Parkinson disease ([place]) 01/04/2016  . REM sleep behavior disorder 12/06/2016  . Right ureteral stone   . Vitamin D deficiency   . Weakness     Past Surgical History:  Procedure Laterality Date  . ANAL FISSURE REPAIR  10-12-2000  . COLONOSCOPY N/A 07/01/2012   Procedure: COLONOSCOPY;  Surgeon: Lafayette Dragon, MD;  [facility]: [facility];  Service: Endoscopy;  Laterality: N/A;  . CYSTOSCOPY WITH RETROGRADE PYELOGRAM, URETEROSCOPY AND STENT PLACEMENT Bilateral 02/26/2013   Procedure: CYSTOSCOPY WITH RETROGRADE PYELOGRAM, URETEROSCOPY AND STENT PLACEMENT;  Surgeon: Alexis Frock, MD;  [facility]: [facility];  Service: Urology;  Laterality: Bilateral;  . HOLMIUM LASER APPLICATION  Bilateral 92/33/0076   Procedure: HOLMIUM LASER APPLICATION;  Surgeon: Alexis Frock, MD;  Location: [facility];  Service: Urology;  Laterality: Bilateral;  . kidney stone removal     06/2011-stent placement  . LEFT MEDIAL FEMORAL CONDYLE DEBRIDEMENT & DRILLING/ REMOVAL LOOSE BODY  09-15-2003  . NASAL SEPTUM SURGERY  1985  . STONE EXTRACTION WITH BASKET  06/12/2011   Procedure: STONE EXTRACTION WITH BASKET;  Surgeon: Claybon Jabs, MD;  Location: [facility];  Service: Urology;  Laterality: Right;    There were no vitals filed for this visit.  Subjective Assessment - 06/01/17 0845    Subjective   Pt reports that he is doing better overall.  Received AE for LB dressing this week and it is working well.    Pertinent History  PD HLD, HTN, DM, obesity    Patient Stated Goals  improve function in right hand    Currently in Pain?  Yes    Pain Score  2     Pain Location  Hip    Pain Orientation  Right    Pain Descriptors / Indicators  Sore    Pain Type  Acute pain    Pain Onset  Today    Pain Frequency  Constant    Aggravating Factors   walking too far  Pain Relieving Factors  medications        PWR! Moves (basic 4) in sitting x 20 each with min cues For incr movement amplitude, particularly with R hand and stepping.  Pt reports difficulty getting cards out of new wallet.   Simulated with playing cards with R hand with min cueing for large amplitude movements then progressed to performing with wallet with min difficulty, but improved success.  Sliding cards off table by using PWR! Hands with focus on full finger ext and min cues for incr movement amplitude with R hand.  Manipulating/straightening cards for bilateral hand task with noted decr R hand movement.  Pt given min-mod cueing for larger amplitude.  Fastening/unfastening buttons on table for bilateral task with min-mod cueing initially for strategies and incr R hand involvement in task (pt compensates with L hand).   Emphasized using dominant R hand to bring button to hole for normal movement pattern and pull-push strategy to incr involvement of R hand with unfastening.  Cleaning hands with hand sanitizer with mod cueing to incr R hand movement as pt was initially only using L hand.    Recommended pt/wife incr awareness of dominant RUE movement in bilateral tasks.  Pt/wife verbalized understanding.                        [facility]) - 06/01/17 1259    PWR! exercises  Moves in standing;Functional moves Review of HEP for PWR! Moves progressions    Basic 4 Flow  PWR! Moves Flow:  PWR! Up>PWR! Rock>PWR! Twist (modified reach up/across)>PWR! Step    PWR! Sit to Stand  Sit<>stand followed by PWR! Up x 5; sit<>stand followed by PWR! Rock x 5, sit<>stand followed by [employer]! Twist (modified reach across body) x 5, then sit<>stand followed by PWR! Step x 5 reps       Balance Exercises - 06/01/17 1258      Balance Exercises: Standing   Tandem Stance  Eyes open;Intermittent upper extremity support;3 reps LLE >10 sec, RLE 3 sec, 9.87 sec, >10 seconds             OT Long Term Goals - 05/25/17 1208      OT LONG TERM GOAL #1   Title  Pt will demonstrate understanding of PD specific HEP following review.    Time  5    Period  Weeks    Status  New      OT LONG TERM GOAL #2   Title  Pt will verbalize understanding of updated adapted strategies for ADLs/IADLs,    Time  5    Period  Weeks    Status  New      OT LONG TERM GOAL #3   Title  Pt will demonstrate improved fine motor coordination for ADLS as evidenced by decreasing RUE 9 hole peg test score to 36 secs or less.    Baseline  RUE 41.75 secs, LUE 25.13    Time  5    Period  Weeks    Status  New      OT LONG TERM GOAL #4   Title  Pt will verbalize understanding of ways to prevent future complications and appropriate community resources    Time  5    Period  Weeks    Status  New      OT LONG TERM GOAL #5   Title  Pt will  write a short paragraph with good legibility and no significant decrease  in letter size.    Time  5    Period  Weeks    Status  On-going            Plan - 06/01/17 0845    Clinical Impression Statement  Pt is progressing towards goals with improving movement amplitude and response to cueing for large amplitude movements.  However, noted decr R hand movement for bilateral tasks.    Rehab Potential  Good    Current Impairments/barriers affecting progress:  bradykinesia, rigidity    OT Frequency  -- 11 visits    OT Duration  8 weeks    OT Treatment/Interventions  Self-care/ADL training;Moist Heat;Fluidtherapy;DME and/or AE instruction;Balance training;Therapeutic activities;Cognitive remediation/compensation;Therapeutic exercise;Passive range of motion;Functional Mobility Training;Neuromuscular education;Cryotherapy;Paraffin;Energy conservation;Manual Therapy;Patient/family education    Plan  big movements with ADLS, fine motor coordination; bilateral tasks with focus on incr R hand movement amplitude    Consulted and Agree with Plan of Care  Patient;Family member/caregiver       Patient will benefit from skilled therapeutic intervention in order to improve the following deficits and impairments:  Abnormal gait, Decreased range of motion, Difficulty walking, Impaired flexibility, Decreased safety awareness, Decreased endurance, Decreased activity tolerance, Decreased knowledge of precautions, Impaired tone, Impaired UE functional use, Pain, Decreased balance, Decreased cognition, Decreased mobility, Decreased strength  Visit Diagnosis: Other symptoms and signs involving the nervous system  Other lack of coordination  Other symptoms and signs involving the musculoskeletal system  Other abnormalities of gait and mobility  Abnormal posture  Unsteadiness on feet    Problem List Patient Active Problem List   Diagnosis Date Noted  . OSA treated with BiPAP 01/03/2017  . REM sleep  behavior disorder 12/06/2016  . Uncontrolled secondary diabetes mellitus with stage 3 CKD (GFR 30-59) (HCC) 10/23/2016  . Medication management 10/23/2016  . Vitamin D deficiency 10/23/2016  . Parkinson disease ([place]) 01/04/2016  . Seasonal and perennial allergic rhinitis 06/21/2014  . Asthma, mild intermittent, well-controlled 06/21/2014  . Pseudomonas aeruginosa colonization 10/08/2012  . Hemorrhage of rectum and anus 07/01/2012  . CAD (coronary artery disease) 11/15/2011  . Hypertension 11/15/2011  . Hyperlipidemia 11/15/2011  . Class 3 obesity due to excess calories with serious comorbidity and body mass index (BMI) of 45.0 to 49.9 in adult 11/15/2011  . Ureteral calculus, right 06/12/2011    [facility] 06/01/2017, 1:11 PM  [facility] [address] [place], [place], [address] Phone: [phone]   Fax:  [phone]  Name: AEDIN JEANSONNE MRN: [id number] Date of Birth: 1955/10/17   Vianne Bulls, OTR/L [facility] [address] [place], [place]  [address] [phone] phone [phone] 06/01/17 1:11 PM

## 2017-06-01 NOTE — Therapy (Signed)
[facility] [address] [place], [place], [address] Phone: [phone]   Fax:  [phone]  Physical Therapy Treatment  Patient Details  Name: Todd Rice MRN: [id number] Date of Birth: March 14, 1956 Referring Provider: Parkinson's disease   Encounter Date: 06/01/2017  PT End of Session - 06/01/17 1309    Visit Number  6    Number of Visits  9    Authorization Type  [facility]    PT Start Time  1018    PT Stop Time  1100    PT Time Calculation (min)  42 min    Activity Tolerance  Patient tolerated treatment well Reports no increase in hip pain at end of session    Behavior During Therapy  [facility] for tasks assessed/performed       Past Medical History:  Diagnosis Date  . Adult BMI 50.0-59.9 kg/sq m    52.77  . Anal fissure   . Anxiety   . Diabetes mellitus without complication (HCC)    borderline  . GERD (gastroesophageal reflux disease)    30 years ago, maybe from peptic ulcer  . H/O: gout    STABLE  . Hepatic steatosis 10/01/11  . History of kidney stones   . Hyperlipidemia   . Hypertension   . Kidney tumor    right upper kidney tumor, monitoring  . OSA on CPAP   . Parkinson disease ([place]) 01/04/2016  . REM sleep behavior disorder 12/06/2016  . Right ureteral stone   . Vitamin D deficiency   . Weakness     Past Surgical History:  Procedure Laterality Date  . ANAL FISSURE REPAIR  10-12-2000  . COLONOSCOPY N/A 07/01/2012   Procedure: COLONOSCOPY;  Surgeon: Lafayette Dragon, MD;  [facility];  Service: Endoscopy;  Laterality: N/A;  . CYSTOSCOPY WITH RETROGRADE PYELOGRAM, URETEROSCOPY AND STENT PLACEMENT Bilateral 02/26/2013   Procedure: CYSTOSCOPY WITH RETROGRADE PYELOGRAM, URETEROSCOPY AND STENT PLACEMENT;  Surgeon: Alexis Frock, MD;  [facility];  Service: Urology;  Laterality: Bilateral;  . HOLMIUM LASER APPLICATION Bilateral 93/79/0240   Procedure: HOLMIUM LASER APPLICATION;  Surgeon: Alexis Frock, MD;  [facility];  Service: Urology;  Laterality: Bilateral;  . kidney stone removal     06/2011-stent placement  . LEFT MEDIAL FEMORAL CONDYLE DEBRIDEMENT & DRILLING/ REMOVAL LOOSE BODY  09-15-2003  . NASAL SEPTUM SURGERY  1985  . STONE EXTRACTION WITH BASKET  06/12/2011   Procedure: STONE EXTRACTION WITH BASKET;  Surgeon: Claybon Jabs, MD;  Location[facility];  Service: Urology;  Laterality: Right;    There were no vitals filed for this visit.  Subjective Assessment - 06/01/17 1021    Subjective  Hip is bothering me a little bit more today.  I went and joined the [facility] and have dropped the form off to Dr. [name] about the [place] class.    Patient Stated Goals  Pt's goal for physical therapy is to get looser like I was when I left here last year.    Currently in Pain?  Yes    Pain Score  2     Pain Location  Hip    Pain Orientation  Right    Pain Descriptors / Indicators  Sore    Pain Type  Acute pain    Pain Onset  Today    Pain Frequency  Constant    Aggravating Factors   walking too far    Pain Relieving Factors  medications  [place] [facility] PT Treatment/Exercise - 06/01/17 0001      Ambulation/Gait   Ambulation/Gait  Yes    Ambulation/Gait Assistance  5: Supervision    Ambulation Distance (Feet)  400 Feet 350 ft; 230 ft with single walking pole    Assistive device  None using bilateral walking poles    Gait Pattern  Step-through pattern;Decreased step length - right;Decreased arm swing - right;Decreased trunk rotation    Ambulation Surface  Level;Indoor    Pre-Gait Activities  Discussed use of walking poles for longer distance walking as exercise for improved step length, to offset stress on RLE/ R hip    Gait Comments  Trialed use of single walking pole, but when he uses pole in LUE only, he has decreased trunk rotation, decreased RUE arm swing.      High Level Balance   High Level Balance Comments   Single leg stance activities at step:  forward step up-up/down-down x 10 each leg, alternating step taps x 20 to 6", 12" step, then forward single leg step ups x 10, UE support.  Side step ups x 10 reps each leg.      Self-Care   Self-Care  Other Self-Care Comments    Other Self-Care Comments   Reviewed community fitness options discussed last visit:  pt has joined [employer] and plans to try [employer] cycling class.  Pt plans to observe [employer] Class next week; discussed use of walking poles as addition to walking that he and wife are doing at park.  Discussed POC and likely finishing up PT early next week, as pt on target towards PT goals.  Discussed R hip pain as potential limitation for exercise-he is following up with Dr. [name].  Advised patient to advocate with Dr. [name] to help with R hip pain control so that pain does not affect/limit pt's ability for ongoing community exercise.       With SLS activities-pt tends to need intermittent UE and increased R lean.  Cues provided for upright posture, use of visual target to decrease R lateral lean and need for UE support.   [facility]) - 06/01/17 1259    PWR! exercises  Moves in standing;Functional moves Review of HEP for [employer] Moves progressions    Basic 4 Flow  [employer] Moves Flow:  PWR! Up>PWR! Rock>PWR! Twist (modified reach up/across)>PWR! Step    PWR! Sit to Stand  Sit<>stand followed by [employer]! Up x 5; sit<>stand followed by [employer] Rock x 5, sit<>stand followed by [employer]! Twist (modified reach across body) x 5, then sit<>stand followed by PWR! Step x 5 reps       Balance Exercises - 06/01/17 1258      Balance Exercises: Standing   Tandem Stance  --    SLS  Eyes open;Intermittent upper extremity support;3 reps LLE >10 sec, RLE 3 sec, 9.87 sec, 10 sec        PT Education - 06/01/17 1309    Education provided  Yes    Education Details  Review of continued community fitness; POC, plans for d/c next visit fo PT    Person(s) Educated   Patient;Spouse    Methods  Explanation    Comprehension  Verbalized understanding          PT Long Term Goals - 05/18/17 1322      PT LONG TERM GOAL #1   Title  Pt will be independent with Parkinson's specific HEP for improved balance and gait.  Updated target 06/15/17 (due to  pt returning for first visit on 05/18/17)    Time  4    Period  Weeks    Status  On-going    Target Date  06/15/17      PT LONG TERM GOAL #2   Title  Pt will perform 5x sit<>stand in less than 11 seconds for improved transfer efficiency and safety.    Time  4    Period  Weeks    Status  On-going    Target Date  06/15/17      PT LONG TERM GOAL #3   Title  Pt will improve SLS in RLE to >3 seconds for improved obstacle and stair negotiation.    Time  4    Period  Weeks    Status  On-going    Target Date  06/15/17      PT LONG TERM GOAL #4   Title  Pt will verbalize plans for continued community fitness upon d/c from PT.    Time  4    Period  Weeks    Status  On-going    Target Date  06/15/17            Plan - 06/01/17 1310    Clinical Impression Statement  Focused PT session today on review of progressions of [employer] Moves exercises-pt return demo understanding with initial cues for sequence.  Also looked at SLS, and pt has improved SLS on RLE with cues for upright posture and use of visual target.  Pt appears on target for meeting LTGs and will likley discharge next visit.    Rehab Potential  Good    PT Frequency  2x / week    PT Duration  4 weeks plus eval    PT Treatment/Interventions  ADLs/Self Care Home Management;Gait training;Stair training;Functional mobility training;Therapeutic activities;Therapeutic exercise;Balance training;Patient/family education;Neuromuscular re-education    PT Next Visit Plan  Check pt's LTGs and plan for discharge next visit.    Consulted and Agree with Plan of Care  Patient;Family member/caregiver    Family Member Consulted  wife       Patient will benefit from  skilled therapeutic intervention in order to improve the following deficits and impairments:  Abnormal gait, Decreased balance, Decreased mobility, Difficulty walking, Postural dysfunction  Visit Diagnosis: Unsteadiness on feet  Other abnormalities of gait and mobility  Abnormal posture     Problem List Patient Active Problem List   Diagnosis Date Noted  . OSA treated with BiPAP 01/03/2017  . REM sleep behavior disorder 12/06/2016  . Uncontrolled secondary diabetes mellitus with stage 3 CKD (GFR 30-59) (HCC) 10/23/2016  . Medication management 10/23/2016  . Vitamin D deficiency 10/23/2016  . Parkinson disease ([place]) 01/04/2016  . Seasonal and perennial allergic rhinitis 06/21/2014  . Asthma, mild intermittent, well-controlled 06/21/2014  . Pseudomonas aeruginosa colonization 10/08/2012  . Hemorrhage of rectum and anus 07/01/2012  . CAD (coronary artery disease) 11/15/2011  . Hypertension 11/15/2011  . Hyperlipidemia 11/15/2011  . Class 3 obesity due to excess calories with serious comorbidity and body mass index (BMI) of 45.0 to 49.9 in adult 11/15/2011  . Ureteral calculus, right 06/12/2011    Jemila Camille W. 06/01/2017, 1:20 PM  Frazier Butt., PT   [facility] [address] [place], [place], [address] Phone: [phone]   Fax:  [phone]  Name: CJ EDGELL MRN: [id number] Date of Birth: December 08, 1955

## 2017-06-02 NOTE — Therapy (Signed)
[facility] [address] [place], [place], [address] Phone: [phone]   Fax:  [phone]  Speech Language Pathology Treatment  Patient Details  Name: Todd Rice MRN: [id number] Date of Birth: 1955-12-15 Referring Provider: Neldon Newport., MD   Encounter Date: 06/01/2017  End of Session - 06/02/17 1159    Visit Number  6    Number of Visits  17    Date for SLP Re-Evaluation  07/05/17    SLP Start Time  0847    SLP Stop Time   0925    SLP Time Calculation (min)  38 min    Activity Tolerance  Patient tolerated treatment well       Past Medical History:  Diagnosis Date  . Adult BMI 50.0-59.9 kg/sq m    52.77  . Anal fissure   . Anxiety   . Diabetes mellitus without complication (HCC)    borderline  . GERD (gastroesophageal reflux disease)    30 years ago, maybe from peptic ulcer  . H/O: gout    STABLE  . Hepatic steatosis 10/01/11  . History of kidney stones   . Hyperlipidemia   . Hypertension   . Kidney tumor    right upper kidney tumor, monitoring  . OSA on CPAP   . Parkinson disease ([place]) 01/04/2016  . REM sleep behavior disorder 12/06/2016  . Right ureteral stone   . Vitamin D deficiency   . Weakness     Past Surgical History:  Procedure Laterality Date  . ANAL FISSURE REPAIR  10-12-2000  . COLONOSCOPY N/A 07/01/2012   Procedure: COLONOSCOPY;  Surgeon: Lafayette Dragon, MD;  [facility];  Service: Endoscopy;  Laterality: N/A;  . CYSTOSCOPY WITH RETROGRADE PYELOGRAM, URETEROSCOPY AND STENT PLACEMENT Bilateral 02/26/2013   Procedure: CYSTOSCOPY WITH RETROGRADE PYELOGRAM, URETEROSCOPY AND STENT PLACEMENT;  Surgeon: Alexis Frock, MD;  [facility];  Service: Urology;  Laterality: Bilateral;  . HOLMIUM LASER APPLICATION Bilateral 95/63/8756   Procedure: HOLMIUM LASER APPLICATION;  Surgeon: Alexis Frock, MD;  [facility];  Service: Urology;  Laterality: Bilateral;  . kidney stone  removal     06/2011-stent placement  . LEFT MEDIAL FEMORAL CONDYLE DEBRIDEMENT & DRILLING/ REMOVAL LOOSE BODY  09-15-2003  . NASAL SEPTUM SURGERY  1985  . STONE EXTRACTION WITH BASKET  06/12/2011   Procedure: STONE EXTRACTION WITH BASKET;  Surgeon: Claybon Jabs, MD;  Location[facility];  Service: Urology;  Laterality: Right;    There were no vitals filed for this visit.  Subjective Assessment - 06/01/17 0856    Subjective  "I quit doing them (loud /a/) - just kidding."    Patient is accompained by:  -- wife    Currently in Pain?  Yes    Pain Score  2     Pain Location  Hip    Pain Orientation  Right    Pain Descriptors / Indicators  Sore    Pain Type  Acute pain    Pain Onset  More than a month ago    Pain Frequency  Constant    Aggravating Factors   too much weight    Pain Relieving Factors  meds            ADULT SLP TREATMENT - 06/01/17 0858      General Information   Behavior/Cognition  Alert;Cooperative;Pleasant mood      Treatment Provided   Treatment provided  Cognitive-Linquistic      Cognitive-Linquistic Treatment  Treatment focused on  Dysarthria    Skilled Treatment  Loud /a/ used by SLP today to recalibrate volume - average in mid 90's dB. Moderately complex conversation over 20 minutes pt maintained volume averate of low 70s dB with independence         SLP Short Term Goals - 06/02/17 1204      SLP SHORT TERM GOAL #1   Title  pt will maintain loud /a/ average 94dB with appropriate voicing over 4 sessions    Status  Achieved      SLP SHORT TERM GOAL #2   Title  pt will produce 20/20 sentences with average 70dB over two sessions    Status  Achieved      SLP SHORT TERM GOAL #3   Title  pt will demo simple conversation of 10 minutes with average 70dB over two sessions    Status  Achieved       SLP Long Term Goals - 06/01/17 0900      SLP LONG TERM GOAL #1   Title  pt will maintain loud /a/ at 95dB with proper voicing over 5  sessions    Baseline  05-25-17; 05/29/17, 06-01-17    Time  6    Period  Weeks    Status  On-going      SLP LONG TERM GOAL #2   Title  pt will demo 10 minutes mod complex conversastion with average 70dB over three sessions    Baseline  05-25-17; 05/29/17, 06-01-17    Time  6    Period  Weeks    Status  On-going      SLP LONG TERM GOAL #3   Title  pt will demo 20 minutes mod complex conversation wiht average 70dB outside [place] room, over two sessions    Time  6    Period  Weeks    Status  Revised       Plan - 06/02/17 1200    Clinical Impression Statement  Pt presents with WNL speech loudness carrying over into conversation. Pt cont to require skilled ST for consistency in maintaining WNL loudness over time. Based upon success today I suspect pt will not need to be seen for > 4 weeks (8 ST sessions)    Speech Therapy Frequency  2x / week    Treatment/Interventions  Cueing hierarchy;SLP instruction and feedback;Compensatory strategies;Functional tasks;Compensatory techniques;Internal/external aids;Patient/family education    Potential to Achieve Goals  Good       Patient will benefit from skilled therapeutic intervention in order to improve the following deficits and impairments:   Dysarthria and anarthria    Problem List Patient Active Problem List   Diagnosis Date Noted  . OSA treated with BiPAP 01/03/2017  . REM sleep behavior disorder 12/06/2016  . Uncontrolled secondary diabetes mellitus with stage 3 CKD (GFR 30-59) (HCC) 10/23/2016  . Medication management 10/23/2016  . Vitamin D deficiency 10/23/2016  . Parkinson disease ([place]) 01/04/2016  . Seasonal and perennial allergic rhinitis 06/21/2014  . Asthma, mild intermittent, well-controlled 06/21/2014  . Pseudomonas aeruginosa colonization 10/08/2012  . Hemorrhage of rectum and anus 07/01/2012  . CAD (coronary artery disease) 11/15/2011  . Hypertension 11/15/2011  . Hyperlipidemia 11/15/2011  . Class 3 obesity due to excess  calories with serious comorbidity and body mass index (BMI) of 45.0 to 49.9 in adult 11/15/2011  . Ureteral calculus, right 06/12/2011    [facility] ,[place], CCC-SLP  06/02/2017, 12:06 PM  [facility] [address]  [place], [place], [address] Phone: [phone]   Fax:  [phone]   Name: CALEY VOLKERT MRN: [id number] Date of Birth: 30-Apr-1956

## 2017-06-04 ENCOUNTER — Other Ambulatory Visit: Payer: Self-pay | Admitting: Internal Medicine

## 2017-06-04 DIAGNOSIS — E119 Type 2 diabetes mellitus without complications: Secondary | ICD-10-CM

## 2017-06-04 MED ORDER — METFORMIN HCL ER 500 MG PO TB24: ORAL_TABLET | ORAL | 1 refills | Status: DC

## 2017-06-04 MED ORDER — PREDNISONE 10 MG PO TABS: ORAL_TABLET | ORAL | 0 refills | Status: DC

## 2017-06-04 MED FILL — predniSONE 10 MG TABS: 10 | 11 days supply | Qty: 20 | Fill #0

## 2017-06-04 MED FILL — VALSARTAN-HCTZ 320-25 MG TA: 320-25 | 30 days supply | Qty: 30 | Fill #3

## 2017-06-04 MED FILL — METFORMIN HCL ER 500 MG TAB: 500 | 90 days supply | Qty: 360 | Fill #0

## 2017-06-05 ENCOUNTER — Ambulatory Visit: Payer: 59 | Admitting: Speech Pathology

## 2017-06-05 ENCOUNTER — Ambulatory Visit: Payer: 59 | Admitting: Occupational Therapy

## 2017-06-05 ENCOUNTER — Ambulatory Visit: Payer: 59 | Admitting: Physical Therapy

## 2017-06-05 ENCOUNTER — Encounter: Payer: Self-pay | Admitting: Physical Therapy

## 2017-06-05 ENCOUNTER — Encounter: Payer: Self-pay | Admitting: Speech Pathology

## 2017-06-05 DIAGNOSIS — R2681 Unsteadiness on feet: Secondary | ICD-10-CM

## 2017-06-05 DIAGNOSIS — R29818 Other symptoms and signs involving the nervous system: Secondary | ICD-10-CM

## 2017-06-05 DIAGNOSIS — R293 Abnormal posture: Secondary | ICD-10-CM | POA: Diagnosis not present

## 2017-06-05 DIAGNOSIS — R471 Dysarthria and anarthria: Secondary | ICD-10-CM

## 2017-06-05 DIAGNOSIS — R29898 Other symptoms and signs involving the musculoskeletal system: Secondary | ICD-10-CM

## 2017-06-05 DIAGNOSIS — R2689 Other abnormalities of gait and mobility: Secondary | ICD-10-CM | POA: Diagnosis not present

## 2017-06-05 DIAGNOSIS — R278 Other lack of coordination: Secondary | ICD-10-CM | POA: Diagnosis not present

## 2017-06-05 NOTE — Therapy (Signed)
[facility] [address] [place], [place], [address] Phone: [phone]   Fax:  [phone]  Speech Language Pathology Treatment  Patient Details  Name: Todd Rice MRN: [id number] Date of Birth: 1955/05/21 Referring Provider: Neldon Newport., MD   Encounter Date: 06/05/2017  End of Session - 06/05/17 1138    SLP Start Time  1100    SLP Stop Time   1139    SLP Time Calculation (min)  39 min    Activity Tolerance  Patient tolerated treatment well       Past Medical History:  Diagnosis Date  . Adult BMI 50.0-59.9 kg/sq m    52.77  . Anal fissure   . Anxiety   . Diabetes mellitus without complication (HCC)    borderline  . GERD (gastroesophageal reflux disease)    30 years ago, maybe from peptic ulcer  . H/O: gout    STABLE  . Hepatic steatosis 10/01/11  . History of kidney stones   . Hyperlipidemia   . Hypertension   . Kidney tumor    right upper kidney tumor, monitoring  . OSA on CPAP   . Parkinson disease ([place]) 01/04/2016  . REM sleep behavior disorder 12/06/2016  . Right ureteral stone   . Vitamin D deficiency   . Weakness     Past Surgical History:  Procedure Laterality Date  . ANAL FISSURE REPAIR  10-12-2000  . COLONOSCOPY N/A 07/01/2012   Procedure: COLONOSCOPY;  Surgeon: Lafayette Dragon, MD;  [facility];  Service: Endoscopy;  Laterality: N/A;  . CYSTOSCOPY WITH RETROGRADE PYELOGRAM, URETEROSCOPY AND STENT PLACEMENT Bilateral 02/26/2013   Procedure: CYSTOSCOPY WITH RETROGRADE PYELOGRAM, URETEROSCOPY AND STENT PLACEMENT;  Surgeon: Alexis Frock, MD;  [facility];  Service: Urology;  Laterality: Bilateral;  . HOLMIUM LASER APPLICATION Bilateral 77/41/2878   Procedure: HOLMIUM LASER APPLICATION;  Surgeon: Alexis Frock, MD;  [facility];  Service: Urology;  Laterality: Bilateral;  . kidney stone removal     06/2011-stent placement  . LEFT MEDIAL FEMORAL CONDYLE DEBRIDEMENT &  DRILLING/ REMOVAL LOOSE BODY  09-15-2003  . NASAL SEPTUM SURGERY  1985  . STONE EXTRACTION WITH BASKET  06/12/2011   Procedure: STONE EXTRACTION WITH BASKET;  Surgeon: Claybon Jabs, MD;  Location: [facility];  Service: Urology;  Laterality: Right;    There were no vitals filed for this visit.         ADULT SLP TREATMENT - 06/05/17 1117      General Information   Behavior/Cognition  Alert;Cooperative;Pleasant mood      Treatment Provided   Treatment provided  Cognitive-Linquistic      Cognitive-Linquistic Treatment   Treatment focused on  Dysarthria    Skilled Treatment  Loud /a/ to recalibrate volume average of 95dB with mod I. Volume average 71dB over 20 minute conversation with supervision cues. Outside of clinic walking, pt maintained volume with independence cues over 12 minutes.       Assessment / Recommendations / Plan   Plan  Continue with current plan of care      Progression Toward Goals   Progression toward goals  Progressing toward goals         SLP Short Term Goals - 06/05/17 1134      SLP SHORT TERM GOAL #1   Title  pt will maintain loud /a/ average 94dB with appropriate voicing over 4 sessions    Status  Achieved      SLP SHORT  TERM GOAL #2   Title  pt will produce 20/20 sentences with average 70dB over two sessions    Status  Achieved      SLP SHORT TERM GOAL #3   Title  pt will demo simple conversation of 10 minutes with average 70dB over two sessions    Status  Achieved       SLP Long Term Goals - 06/05/17 1134      SLP LONG TERM GOAL #1   Title  pt will maintain loud /a/ at 95dB with proper voicing over 5 sessions    Baseline  05-25-17; 05/29/17, 06-01-17; 06/05/17    Time  6    Period  Weeks    Status  On-going      SLP LONG TERM GOAL #2   Title  pt will demo 10 minutes mod complex conversastion with average 70dB over three sessions    Baseline  05-25-17; 05/29/17, 06-01-17; 06/05/17    Time  6    Period  Weeks    Status   Achieved      SLP LONG TERM GOAL #3   Title  pt will demo 20 minutes mod complex conversation wiht average 70dB outside [place] room, over two sessions    Time  6    Period  Weeks    Status  Revised       Plan - 06/05/17 1131    Clinical Impression Statement  Pt continues to present with WNL conversational volume. Continue skilled ST 1 more session to maximize carryover of WNL loudness across settings in the community.     Speech Therapy Frequency  2x / week    Treatment/Interventions  Cueing hierarchy;SLP instruction and feedback;Compensatory strategies;Functional tasks;Compensatory techniques;Internal/external aids;Patient/family education    Potential to Achieve Goals  Good       Patient will benefit from skilled therapeutic intervention in order to improve the following deficits and impairments:   Dysarthria and anarthria    Problem List Patient Active Problem List   Diagnosis Date Noted  . OSA treated with BiPAP 01/03/2017  . REM sleep behavior disorder 12/06/2016  . Uncontrolled secondary diabetes mellitus with stage 3 CKD (GFR 30-59) (HCC) 10/23/2016  . Medication management 10/23/2016  . Vitamin D deficiency 10/23/2016  . Parkinson disease ([place]) 01/04/2016  . Seasonal and perennial allergic rhinitis 06/21/2014  . Asthma, mild intermittent, well-controlled 06/21/2014  . Pseudomonas aeruginosa colonization 10/08/2012  . Hemorrhage of rectum and anus 07/01/2012  . CAD (coronary artery disease) 11/15/2011  . Hypertension 11/15/2011  . Hyperlipidemia 11/15/2011  . Class 3 obesity due to excess calories with serious comorbidity and body mass index (BMI) of 45.0 to 49.9 in adult 11/15/2011  . Ureteral calculus, right 06/12/2011    Lovvorn, Annye Rusk MS, CCC-SLP 06/05/2017, 11:49 AM  [facility] [address] [place], [place], [address] Phone: [phone]   Fax:  [phone]   Name: YARDLEY LEKAS MRN:  [id number] Date of Birth: [birth year]

## 2017-06-05 NOTE — Therapy (Signed)
[facility] [place] [address] [place], [place], [address] Phone: [phone]   Fax:  [phone]  Physical Therapy Treatment  Patient Details  Name: DEMARIUS ARCHILA MRN: [id number] Date of Birth: 02/03/1956 Referring Provider: Parkinson's disease   Encounter Date: 06/05/2017  PT End of Session - 06/05/17 2013    Visit Number  7    Number of Visits  9    Authorization Type  UMR    PT Start Time  2979    PT Stop Time  8921 discharge visit-goals checked; pt d/c'd    PT Time Calculation (min)  32 min    Activity Tolerance  Patient tolerated treatment well Reports no increase in hip pain at end of session    Behavior During Therapy  [facility] for tasks assessed/performed       Past Medical History:  Diagnosis Date  . Adult BMI 50.0-59.9 kg/sq m    52.77  . Anal fissure   . Anxiety   . Diabetes mellitus without complication (HCC)    borderline  . GERD (gastroesophageal reflux disease)    30 years ago, maybe from peptic ulcer  . H/O: gout    STABLE  . Hepatic steatosis 10/01/11  . History of kidney stones   . Hyperlipidemia   . Hypertension   . Kidney tumor    right upper kidney tumor, monitoring  . OSA on CPAP   . Parkinson disease ([place]) 01/04/2016  . REM sleep behavior disorder 12/06/2016  . Right ureteral stone   . Vitamin D deficiency   . Weakness     Past Surgical History:  Procedure Laterality Date  . ANAL FISSURE REPAIR  10-12-2000  . COLONOSCOPY N/A 07/01/2012   Procedure: COLONOSCOPY;  Surgeon: Lafayette Dragon, MD;  [facility];  Service: Endoscopy;  Laterality: N/A;  . CYSTOSCOPY WITH RETROGRADE PYELOGRAM, URETEROSCOPY AND STENT PLACEMENT Bilateral 02/26/2013   Procedure: CYSTOSCOPY WITH RETROGRADE PYELOGRAM, URETEROSCOPY AND STENT PLACEMENT;  Surgeon: Alexis Frock, MD;  [facility];  Service: Urology;  Laterality: Bilateral;  . HOLMIUM LASER APPLICATION Bilateral 19/41/7408   Procedure:  HOLMIUM LASER APPLICATION;  Surgeon: Alexis Frock, MD;  [facility];  Service: Urology;  Laterality: Bilateral;  . kidney stone removal     06/2011-stent placement  . LEFT MEDIAL FEMORAL CONDYLE DEBRIDEMENT & DRILLING/ REMOVAL LOOSE BODY  09-15-2003  . NASAL SEPTUM SURGERY  1985  . STONE EXTRACTION WITH BASKET  06/12/2011   Procedure: STONE EXTRACTION WITH BASKET;  Surgeon: Claybon Jabs, MD;  Location: [facility];  Service: Urology;  Laterality: Right;    There were no vitals filed for this visit.  Subjective Assessment - 06/05/17 1017    Subjective  Hip just never stopped bothering me, so I saw my primary physician and he put me back on prednisone.  Did some water walking yesterday and that felt good.    Patient Stated Goals  Pt's goal for physical therapy is to get looser like I was when I left here last year.    Currently in Pain?  Yes    Pain Score  2  0-2    Pain Location  Hip    Pain Orientation  Right    Pain Descriptors / Indicators  Sore    Pain Onset  1 to 4 weeks ago    Pain Frequency  Intermittent    Aggravating Factors   walking    Pain Relieving Factors  medications alleviate-back on  prednisone                      [facility] Adult PT Treatment/Exercise - 06/05/17 0001      Transfers   Transfers  Sit to Stand;Stand to Sit    Sit to Stand  6: Modified independent (Device/Increase time);Without upper extremity assist;From chair/3-in-1    Five time sit to stand comments   10.57    Stand to Sit  6: Modified independent (Device/Increase time);Without upper extremity assist;To chair/3-in-1      Ambulation/Gait   Ambulation/Gait  Yes    Ambulation/Gait Assistance  7: Independent    Gait velocity  9 sec = 3.64 ft/sec      Standardized Balance Assessment   Standardized Balance Assessment  Timed Up and Go Test      Timed Up and Go Test   TUG  Normal TUG    Normal TUG (seconds)  10.97      High Level Balance   High Level Balance  Comments  SLS LLE:  11.75 sec, RLE 10.65      Self-Care   Self-Care  Other Self-Care Comments    Other Self-Care Comments   Reviewed optimal fitness program and community fitness options:  reiterated importance of therapy HEP performance daily (with varied PWR! Moves exercises from PT/OT performed several times throughout the week).  Discussed walking at park, including use of walking poles, aerobic activity at [facility], including options for water walking activities (provided handout).  Discussed progress towards goals, POC, and plans for discharge with return screen in 6 months.        [facility]) - 06/05/17 2009    PWR! exercises  Moves in standing Full review of HEP:  pt return demo understanding    PWR! Up  x 10 reps    PWR! Rock  x 10 reps each side    PWR! Twist  x 10 reps each side    PWR Step  x 10 reps each side    Basic 4 Flow  PWR! Moves Flow:  PWR! UP>PWR! Rock>PWR! Twist (modified reach across body)>PWR! Step x 5 reps    PWR! Sit to Stand  PWR! Moves Basic 4, performed x 5 reps each, with sit<>stand in between in movement-pt return demo understanding of this exercise as part of HEP       Balance Exercises - 06/05/17 2018      Balance Exercises: Standing   Stepping Strategy  Anterior;Posterior;10 reps    Other Standing Exercises  Stagger stance forward/back rocking x 10-15 reps each foot position, for improved anterior/posterior weightshifting        PT Education - 06/05/17 2013    Education provided  Yes    Education Details  Progress towards goals, plans for discharge, return screen in 6-9 months; reviewed optimal fitness program    Person(s) Educated  Patient    Methods  Explanation;Demonstration    Comprehension  Verbalized understanding          PT Long Term Goals - 06/05/17 1019      PT LONG TERM GOAL #1   Title  Pt will be independent with Parkinson's specific HEP for improved balance and gait.  Updated target 06/15/17 (due to pt returning for first visit on  05/18/17)    Time  4    Period  Weeks    Status  Achieved      PT LONG TERM GOAL #2   Title  Pt will perform 5x  sit<>stand in less than 11 seconds for improved transfer efficiency and safety.    Time  4    Period  Weeks    Status  Achieved      PT LONG TERM GOAL #3   Title  Pt will improve SLS in RLE to >3 seconds for improved obstacle and stair negotiation.    Time  4    Period  Weeks    Status  Achieved      PT LONG TERM GOAL #4   Title  Pt will verbalize plans for continued community fitness upon d/c from PT.    Time  4    Period  Weeks    Status  Achieved            Plan - 06/05/17 2014    Clinical Impression Statement  Assessed LTGs this visit, with pt meeting all LTGs.  Pt appears to be performing PWR! Moves as HEP with modifications/progressions without difficulty.  He has also started going to [facility] and utilizing machines and the pool.  He is appropriate for discharge at this time.    Rehab Potential  Good    PT Frequency  2x / week    PT Duration  4 weeks plus eval    PT Treatment/Interventions  ADLs/Self Care Home Management;Gait training;Stair training;Functional mobility training;Therapeutic activities;Therapeutic exercise;Balance training;Patient/family education;Neuromuscular re-education    PT Next Visit Plan  Discharge this visit; recommend return PD screen in 6 months.    Consulted and Agree with Plan of Care  Patient;Family member/caregiver    Family Member Consulted  wife       Patient will benefit from skilled therapeutic intervention in order to improve the following deficits and impairments:  Abnormal gait, Decreased balance, Decreased mobility, Difficulty walking, Postural dysfunction  Visit Diagnosis: Unsteadiness on feet  Abnormal posture     Problem List Patient Active Problem List   Diagnosis Date Noted  . OSA treated with BiPAP 01/03/2017  . REM sleep behavior disorder 12/06/2016  . Uncontrolled secondary diabetes mellitus with stage  3 CKD (GFR 30-59) (HCC) 10/23/2016  . Medication management 10/23/2016  . Vitamin D deficiency 10/23/2016  . Parkinson disease ([place]) 01/04/2016  . Seasonal and perennial allergic rhinitis 06/21/2014  . Asthma, mild intermittent, well-controlled 06/21/2014  . Pseudomonas aeruginosa colonization 10/08/2012  . Hemorrhage of rectum and anus 07/01/2012  . CAD (coronary artery disease) 11/15/2011  . Hypertension 11/15/2011  . Hyperlipidemia 11/15/2011  . Class 3 obesity due to excess calories with serious comorbidity and body mass index (BMI) of 45.0 to 49.9 in adult 11/15/2011  . Ureteral calculus, right 06/12/2011    Abbe Bula W. 06/05/2017, 8:19 PM  Frazier Butt., PT   [facility] [address] [place], [place], [address] Phone: [phone]   Fax:  [phone]  Name: Todd Rice MRN: [id number] Date of Birth: [birth year]  PHYSICAL THERAPY DISCHARGE SUMMARY  Visits from Start of Care: 7  Current functional level related to goals / functional outcomes: PT Long Term Goals - 06/05/17 1019      PT LONG TERM GOAL #1   Title  Pt will be independent with Parkinson's specific HEP for improved balance and gait.  Updated target 06/15/17 (due to pt returning for first visit on 05/18/17)    Time  4    Period  Weeks    Status  Achieved      PT LONG TERM GOAL #2   Title  Pt will perform  5x sit<>stand in less than 11 seconds for improved transfer efficiency and safety.    Time  4    Period  Weeks    Status  Achieved      PT LONG TERM GOAL #3   Title  Pt will improve SLS in RLE to >3 seconds for improved obstacle and stair negotiation.    Time  4    Period  Weeks    Status  Achieved      PT LONG TERM GOAL #4   Title  Pt will verbalize plans for continued community fitness upon d/c from PT.    Time  4    Period  Weeks    Status  Achieved      Pt has met all LTGs   Remaining deficits: Posture, bradykinesia    Education / Equipment: Educated in [employer], community fitness options.  Plan: Patient agrees to discharge.  Patient goals were met. Patient is being discharged due to meeting the stated rehab goals.  ?????Recommend return PD screen in 6-9 months due to progressive nature of disease.          Mady Haagensen, PT 06/05/17 8:20 PM Phone: [phone] Fax: [phone]

## 2017-06-05 NOTE — Therapy (Signed)
[place] [address] [place], [place], [address] Phone: [phone]   Fax:  [phone]  Occupational Therapy Treatment  Patient Details  Name: Todd Rice MRN: [id number] Date of Birth: Sep 02, 1955 Referring Provider: Parkinson's disease   Encounter Date: 06/05/2017  OT End of Session - 06/05/17 0950    Visit Number  7    Number of Visits  11    Date for OT Re-Evaluation  07/14/17    Authorization Type  [employer]MR    OT Start Time  0935    OT Stop Time  1015    OT Time Calculation (min)  40 min    Activity Tolerance  Patient tolerated treatment well    Behavior During Therapy  [facility] for tasks assessed/performed       Past Medical History:  Diagnosis Date  . Adult BMI 50.0-59.9 kg/sq m    52.77  . Anal fissure   . Anxiety   . Diabetes mellitus without complication (HCC)    borderline  . GERD (gastroesophageal reflux disease)    30 years ago, maybe from peptic ulcer  . H/O: gout    STABLE  . Hepatic steatosis 10/01/11  . History of kidney stones   . Hyperlipidemia   . Hypertension   . Kidney tumor    right upper kidney tumor, monitoring  . OSA on CPAP   . Parkinson disease ([place]) 01/04/2016  . REM sleep behavior disorder 12/06/2016  . Right ureteral stone   . Vitamin D deficiency   . Weakness     Past Surgical History:  Procedure Laterality Date  . ANAL FISSURE REPAIR  10-12-2000  . COLONOSCOPY N/A 07/01/2012   Procedure: COLONOSCOPY;  Surgeon: Lafayette Dragon, MD;  [facility];  Service: Endoscopy;  Laterality: N/A;  . CYSTOSCOPY WITH RETROGRADE PYELOGRAM, URETEROSCOPY AND STENT PLACEMENT Bilateral 02/26/2013   Procedure: CYSTOSCOPY WITH RETROGRADE PYELOGRAM, URETEROSCOPY AND STENT PLACEMENT;  Surgeon: Alexis Frock, MD;  [facility];  Service: Urology;  Laterality: Bilateral;  . HOLMIUM LASER APPLICATION Bilateral 89/38/1017   Procedure: HOLMIUM LASER APPLICATION;  Surgeon: Alexis Frock, MD;  [facility];  Service: Urology;  Laterality: Bilateral;  . kidney stone removal     06/2011-stent placement  . LEFT MEDIAL FEMORAL CONDYLE DEBRIDEMENT & DRILLING/ REMOVAL LOOSE BODY  09-15-2003  . NASAL SEPTUM SURGERY  1985  . STONE EXTRACTION WITH BASKET  06/12/2011   Procedure: STONE EXTRACTION WITH BASKET;  Surgeon: Claybon Jabs, MD;  [facility];  Service: Urology;  Laterality: Right;    There were no vitals filed for this visit.  Subjective Assessment - 06/05/17 0949    Pertinent History  PD HLD, HTN, DM, obesity    Patient Stated Goals  improve function in right hand    Currently in Pain?  Yes    Pain Score  2     Pain Location  Hip    Pain Orientation  Right    Pain Descriptors / Indicators  Sore    Pain Type  Acute pain    Pain Onset  1 to 4 weeks ago    Pain Frequency  Intermittent    Aggravating Factors   walking    Pain Relieving Factors  meds            Treatment: reinforced importance of using RUE as dominant during bilateral tasks. Modified quadraped basic 4 x 10-20 reps each, min v.c Dynamic step and reach  over target reaching with right then left UE's min v.c Placing and removing bolts/ nuts from bolt board, stringing beads, fastening buttons and applying hand gel with v.c for larger amplitude movements and increased RUE use.                    OT Long Term Goals - 05/25/17 1208      OT LONG TERM GOAL #1   Title  Pt will demonstrate understanding of PD specific HEP following review.    Time  5    Period  Weeks    Status  New      OT LONG TERM GOAL #2   Title  Pt will verbalize understanding of updated adapted strategies for ADLs/IADLs,    Time  5    Period  Weeks    Status  New      OT LONG TERM GOAL #3   Title  Pt will demonstrate improved fine motor coordination for ADLS as evidenced by decreasing RUE 9 hole peg test score to 36 secs or less.    Baseline  RUE 41.75 secs, LUE 25.13     Time  5    Period  Weeks    Status  New      OT LONG TERM GOAL #4   Title  Pt will verbalize understanding of ways to prevent future complications and appropriate community resources    Time  5    Period  Weeks    Status  New      OT LONG TERM GOAL #5   Title  Pt will write a short paragraph with good legibility and no significant decrease in letter size.    Time  5    Period  Weeks    Status  On-going            Plan - 06/05/17 6045    Clinical Impression Statement  Pt is progressing towards goals with improving movement amplitude and response to cueing for large amplitude movements.      Rehab Potential  Good    Current Impairments/barriers affecting progress:  bradykinesia, rigidity    OT Frequency  -- 11 visits    OT Duration  8 weeks    OT Treatment/Interventions  Self-care/ADL training;Moist Heat;Fluidtherapy;DME and/or AE instruction;Balance training;Therapeutic activities;Cognitive remediation/compensation;Therapeutic exercise;Passive range of motion;Functional Mobility Training;Neuromuscular education;Cryotherapy;Paraffin;Energy conservation;Manual Therapy;Patient/family education    Plan  check goals anticipate d/c ,big movements with ADLS, fine motor coordination; bilateral tasks with focus on incr R hand movement amplitude    Consulted and Agree with Plan of Care  Patient       Patient will benefit from skilled therapeutic intervention in order to improve the following deficits and impairments:  Abnormal gait, Decreased range of motion, Difficulty walking, Impaired flexibility, Decreased safety awareness, Decreased endurance, Decreased activity tolerance, Decreased knowledge of precautions, Impaired tone, Impaired UE functional use, Pain, Decreased balance, Decreased cognition, Decreased mobility, Decreased strength  Visit Diagnosis: Abnormal posture  Other symptoms and signs involving the nervous system  Other lack of coordination  Other symptoms and signs  involving the musculoskeletal system    Problem List Patient Active Problem List   Diagnosis Date Noted  . OSA treated with BiPAP 01/03/2017  . REM sleep behavior disorder 12/06/2016  . Uncontrolled secondary diabetes mellitus with stage 3 CKD (GFR 30-59) (HCC) 10/23/2016  . Medication management 10/23/2016  . Vitamin D deficiency 10/23/2016  . Parkinson disease ([place]) 01/04/2016  . Seasonal and perennial allergic rhinitis  06/21/2014  . Asthma, mild intermittent, well-controlled 06/21/2014  . Pseudomonas aeruginosa colonization 10/08/2012  . Hemorrhage of rectum and anus 07/01/2012  . CAD (coronary artery disease) 11/15/2011  . Hypertension 11/15/2011  . Hyperlipidemia 11/15/2011  . Class 3 obesity due to excess calories with serious comorbidity and body mass index (BMI) of 45.0 to 49.9 in adult 11/15/2011  . Ureteral calculus, right 06/12/2011    RINE,KATHRYN 06/05/2017, 9:58 AM  [facility] [address] [place], [place], [address] Phone: [phone]   Fax:  [phone]  Name: RAMEZ ARRONA MRN: [id number] Date of Birth: [birth year]

## 2017-06-08 ENCOUNTER — Ambulatory Visit: Payer: 59 | Attending: Internal Medicine | Admitting: Occupational Therapy

## 2017-06-08 ENCOUNTER — Ambulatory Visit: Payer: 59

## 2017-06-08 ENCOUNTER — Ambulatory Visit: Payer: 59 | Admitting: Rehabilitative and Restorative Service Providers"

## 2017-06-08 DIAGNOSIS — R471 Dysarthria and anarthria: Secondary | ICD-10-CM

## 2017-06-08 DIAGNOSIS — R29898 Other symptoms and signs involving the musculoskeletal system: Secondary | ICD-10-CM | POA: Insufficient documentation

## 2017-06-08 DIAGNOSIS — R29818 Other symptoms and signs involving the nervous system: Secondary | ICD-10-CM | POA: Diagnosis not present

## 2017-06-08 DIAGNOSIS — R278 Other lack of coordination: Secondary | ICD-10-CM | POA: Diagnosis not present

## 2017-06-08 NOTE — Therapy (Deleted)
[facility] [address] [place], [place], [address] Phone: [phone]   Fax:  [phone]  Occupational Therapy Treatment  Patient Details  Name: Todd Rice MRN: [id number] Date of Birth: 01-Sep-1955 Referring Provider: Parkinson's disease   Encounter Date: 06/08/2017  OT End of Session - 06/08/17 1006    Visit Number  8    Number of Visits  11    Date for OT Re-Evaluation  07/14/17    Authorization Type  [employer] UMR    Authorization Time Period  initally scheduled for 2x week x 4 weeks    OT Start Time  0935    OT Stop Time  1015    OT Time Calculation (min)  40 min    Activity Tolerance  Patient tolerated treatment well    Behavior During Therapy  [facility] for tasks assessed/performed       Past Medical History:  Diagnosis Date  . Adult BMI 50.0-59.9 kg/sq m    52.77  . Anal fissure   . Anxiety   . Diabetes mellitus without complication (HCC)    borderline  . GERD (gastroesophageal reflux disease)    30 years ago, maybe from peptic ulcer  . H/O: gout    STABLE  . Hepatic steatosis 10/01/11  . History of kidney stones   . Hyperlipidemia   . Hypertension   . Kidney tumor    right upper kidney tumor, monitoring  . OSA on CPAP   . Parkinson disease ([place]) 01/04/2016  . REM sleep behavior disorder 12/06/2016  . Right ureteral stone   . Vitamin D deficiency   . Weakness     Past Surgical History:  Procedure Laterality Date  . ANAL FISSURE REPAIR  10-12-2000  . COLONOSCOPY N/A 07/01/2012   Procedure: COLONOSCOPY;  Surgeon: Lafayette Dragon, MD;  [facility];  Service: Endoscopy;  Laterality: N/A;  . CYSTOSCOPY WITH RETROGRADE PYELOGRAM, URETEROSCOPY AND STENT PLACEMENT Bilateral 02/26/2013   Procedure: CYSTOSCOPY WITH RETROGRADE PYELOGRAM, URETEROSCOPY AND STENT PLACEMENT;  Surgeon: Alexis Frock, MD;  [facility];  Service: Urology;  Laterality: Bilateral;  . HOLMIUM LASER APPLICATION Bilateral  63/81/7711   Procedure: HOLMIUM LASER APPLICATION;  Surgeon: Alexis Frock, MD;  [facility];  Service: Urology;  Laterality: Bilateral;  . kidney stone removal     06/2011-stent placement  . LEFT MEDIAL FEMORAL CONDYLE DEBRIDEMENT & DRILLING/ REMOVAL LOOSE BODY  09-15-2003  . NASAL SEPTUM SURGERY  1985  . STONE EXTRACTION WITH BASKET  06/12/2011   Procedure: STONE EXTRACTION WITH BASKET;  Surgeon: Claybon Jabs, MD;  Location: [facility];  Service: Urology;  Laterality: Right;    There were no vitals filed for this visit.  Subjective Assessment - 06/08/17 0934    Pertinent History  PD HLD, HTN, DM, obesity    Patient Stated Goals  improve function in right hand    Currently in Pain?  Yes    Pain Score  2     Pain Location  Hip    Pain Orientation  Right    Pain Descriptors / Indicators  Sore    Pain Type  Acute pain    Pain Onset  1 to 4 weeks ago    Pain Frequency  Intermittent    Aggravating Factors   walking    Pain Relieving Factors  meds  OT Education - 06/08/17 1008    Education provided  Yes    Education Details  Progress towards goals, ways to prevent future complications, reviewed coordination HEP,     Person(s) Educated  Patient;Spouse    Methods  Explanation;Demonstration;Verbal cues;Handout    Comprehension  Verbalized understanding;Returned demonstration          OT Long Term Goals - 06/08/17 0940      OT LONG TERM GOAL #1   Title  Pt will demonstrate understanding of PD specific HEP following review.    Status  Achieved      OT LONG TERM GOAL #2   Title  Pt will verbalize understanding of updated adapted strategies for ADLs/IADLs,    Status  Achieved      OT LONG TERM GOAL #3   Title  Pt will demonstrate improved fine motor coordination for ADLS as evidenced by decreasing RUE 9 hole peg test score to 36 secs or less.    Status  Achieved 46.72 secs, 41.40 secs      OT LONG TERM GOAL #4    Title  Pt will verbalize understanding of ways to prevent future complications and appropriate community resources    Status  Achieved      OT LONG TERM GOAL #5   Title  Pt will write a short paragraph with good legibility and no significant decrease in letter size.    Status  Achieved            Plan - 06/08/17 1009    Clinical Impression Statement  Pt demonstrates good overall progress. He agrees with plans for d/c.    Rehab Potential  Good    Current Impairments/barriers affecting progress:  bradykinesia, rigidity    OT Treatment/Interventions  Self-care/ADL training;Moist Heat;Fluidtherapy;DME and/or AE instruction;Balance training;Therapeutic activities;Cognitive remediation/compensation;Therapeutic exercise;Passive range of motion;Functional Mobility Training;Neuromuscular education;Cryotherapy;Paraffin;Energy conservation;Manual Therapy;Patient/family education    Plan  D/C OT       Patient will benefit from skilled therapeutic intervention in order to improve the following deficits and impairments:  Abnormal gait, Decreased range of motion, Difficulty walking, Impaired flexibility, Decreased safety awareness, Decreased endurance, Decreased activity tolerance, Decreased knowledge of precautions, Impaired tone, Impaired UE functional use, Pain, Decreased balance, Decreased cognition, Decreased mobility, Decreased strength  Visit Diagnosis: Other symptoms and signs involving the nervous system  Other lack of coordination  Other symptoms and signs involving the musculoskeletal system   OCCUPATIONAL THERAPY DISCHARGE SUMMARY    Current functional level related to goals / functional outcomes: Pt demonstrates excellent overall progress towards goals.    Remaining deficits: Rigidity, bradykinesia, decreased balance, decreased coordination, decreased functional mobility   Education / Equipment: Pt was educated regarding: ways to prevent future complication, PD specific  HEP, community resources, adapted strategies for ADLs. Pt verbalizes understanding of all education.  Plan: Patient agrees to discharge.  Patient goals were partially met. Patient is being discharged due to being pleased with the current functional level.  ?????      Problem List Patient Active Problem List   Diagnosis Date Noted  . OSA treated with BiPAP 01/03/2017  . REM sleep behavior disorder 12/06/2016  . Uncontrolled secondary diabetes mellitus with stage 3 CKD (GFR 30-59) (HCC) 10/23/2016  . Medication management 10/23/2016  . Vitamin D deficiency 10/23/2016  . Parkinson disease ([place]) 01/04/2016  . Seasonal and perennial allergic rhinitis 06/21/2014  . Asthma, mild intermittent, well-controlled 06/21/2014  . Pseudomonas aeruginosa colonization 10/08/2012  . Hemorrhage of rectum and anus 07/01/2012  .  CAD (coronary artery disease) 11/15/2011  . Hypertension 11/15/2011  . Hyperlipidemia 11/15/2011  . Class 3 obesity due to excess calories with serious comorbidity and body mass index (BMI) of 45.0 to 49.9 in adult 11/15/2011  . Ureteral calculus, right 06/12/2011    Avantae Bither 06/08/2017, 10:12 AM  [facility] [address] [place], [place], [address] Phone: [phone]   Fax:  [phone]  Name: DANNI LEABO MRN: [id number] Date of Birth: 02-Dec-1955

## 2017-06-08 NOTE — Therapy (Signed)
[facility] [address] [place], [place], [address] Phone: [phone]   Fax:  [phone]  Speech Language Pathology Treatment  Patient Details  Name: Todd Rice MRN: [id number] Date of Birth: 07/24/1955 Referring Provider: Neldon Newport., MD   Encounter Date: 06/08/2017  End of Session - 06/08/17 1147    Visit Number  8    Number of Visits  17    Date for SLP Re-Evaluation  07/05/17    SLP Start Time  9    SLP Stop Time   1135    SLP Time Calculation (min)  33 min    Activity Tolerance  Patient tolerated treatment well       Past Medical History:  Diagnosis Date  . Adult BMI 50.0-59.9 kg/sq m    52.77  . Anal fissure   . Anxiety   . Diabetes mellitus without complication (HCC)    borderline  . GERD (gastroesophageal reflux disease)    30 years ago, maybe from peptic ulcer  . H/O: gout    STABLE  . Hepatic steatosis 10/01/11  . History of kidney stones   . Hyperlipidemia   . Hypertension   . Kidney tumor    right upper kidney tumor, monitoring  . OSA on CPAP   . Parkinson disease ([place]) 01/04/2016  . REM sleep behavior disorder 12/06/2016  . Right ureteral stone   . Vitamin D deficiency   . Weakness     Past Surgical History:  Procedure Laterality Date  . ANAL FISSURE REPAIR  10-12-2000  . COLONOSCOPY N/A 07/01/2012   Procedure: COLONOSCOPY;  Surgeon: Lafayette Dragon, MD;  Location: [facility];  Service: Endoscopy;  Laterality: N/A;  . CYSTOSCOPY WITH RETROGRADE PYELOGRAM, URETEROSCOPY AND STENT PLACEMENT Bilateral 02/26/2013   Procedure: CYSTOSCOPY WITH RETROGRADE PYELOGRAM, URETEROSCOPY AND STENT PLACEMENT;  Surgeon: Alexis Frock, MD;  Location: [facility];  Service: Urology;  Laterality: Bilateral;  . HOLMIUM LASER APPLICATION Bilateral 26/71/2458   Procedure: HOLMIUM LASER APPLICATION;  Surgeon: Alexis Frock, MD;  Location: [facility];  Service: Urology;  Laterality: Bilateral;  . kidney stone  removal     06/2011-stent placement  . LEFT MEDIAL FEMORAL CONDYLE DEBRIDEMENT & DRILLING/ REMOVAL LOOSE BODY  09-15-2003  . NASAL SEPTUM SURGERY  1985  . STONE EXTRACTION WITH BASKET  06/12/2011   Procedure: STONE EXTRACTION WITH BASKET;  Surgeon: Claybon Jabs, MD;  Location: [facility];  Service: Urology;  Laterality: Right;    There were no vitals filed for this visit.  Subjective Assessment - 06/08/17 1112    Subjective  "Do I get my walking papers?"    Patient is accompained by:  -- wife    Currently in Pain?  Yes    Pain Score  1     Pain Location  Hip    Pain Orientation  Right    Pain Descriptors / Indicators  Sore    Pain Type  Acute pain    Pain Onset  1 to 4 weeks ago    Pain Frequency  Intermittent    Aggravating Factors   walking     Pain Relieving Factors  meds            ADULT SLP TREATMENT - 06/08/17 1114      General Information   Behavior/Cognition  Alert;Cooperative;Pleasant mood      Treatment Provided   Treatment provided  Cognitive-Linquistic      Cognitive-Linquistic Treatment   Treatment focused  on  Dysarthria    Skilled Treatment  Loud /a/ to recalibrate volume average of 95dB with mod I. Volume average lower 70s dB in 15+ minute conversation with independence. Outside of clinic walking for 12 minutes, pt maintained WNL volume and 100% intelligibility with independence.       Assessment / Recommendations / Plan   Plan  Continue with current plan of care      Progression Toward Goals   Progression toward goals  Progressing toward goals         SLP Short Term Goals - 06/05/17 1134      SLP SHORT TERM GOAL #1   Title  pt will maintain loud /a/ average 94dB with appropriate voicing over 4 sessions    Status  Achieved      SLP SHORT TERM GOAL #2   Title  pt will produce 20/20 sentences with average 70dB over two sessions    Status  Achieved      SLP SHORT TERM GOAL #3   Title  pt will demo simple conversation of 10  minutes with average 70dB over two sessions    Status  Achieved       SLP Long Term Goals - 06/08/17 1149      SLP LONG TERM GOAL #1   Title  pt will maintain loud /a/ at 95dB with proper voicing over 5 sessions    Status  Achieved      SLP LONG TERM GOAL #2   Title  pt will demo 10 minutes mod complex conversastion with average 70dB over three sessions    Status  Achieved      SLP LONG TERM GOAL #3   Title  pt will demo 20 minutes mod complex conversation wiht average 70dB outside ST room, over two sessions    Status  Achieved       Plan - 06/08/17 1148    Clinical Impression Statement  Pt continues to present with WNL conversational volume and 100% intelligibility in and out of ST room, as well as outdoors Pt will be d/c'd at this time and agrees with this plan.    Speech Therapy Frequency  2x / week    Treatment/Interventions  Cueing hierarchy;SLP instruction and feedback;Compensatory strategies;Functional tasks;Compensatory techniques;Internal/external aids;Patient/family education    Potential to Achieve Goals  Good       Patient will benefit from skilled therapeutic intervention in order to improve the following deficits and impairments:   Dysarthria and anarthria   SPEECH THERAPY DISCHARGE SUMMARY  Visits from Start of Care: 8   Current functional level related to goals / functional outcomes: Pt made excellent gains in therapy and met all therapy goals. He incr'd his conversational volume to WNL in conversations outside of [place] room as well as outdoors.   Remaining deficits: None.   Education / Equipment: Loud /a/ importance (daily), facial exercises    Plan: Patient agrees to discharge.  Patient goals were met. Patient is being discharged due to meeting the stated rehab goals.  ?????        Problem List Patient Active Problem List   Diagnosis Date Noted  . OSA treated with BiPAP 01/03/2017  . REM sleep behavior disorder 12/06/2016  . Uncontrolled  secondary diabetes mellitus with stage 3 CKD (GFR 30-59) (HCC) 10/23/2016  . Medication management 10/23/2016  . Vitamin D deficiency 10/23/2016  . Parkinson disease ([place]) 01/04/2016  . Seasonal and perennial allergic rhinitis 06/21/2014  . Asthma, mild intermittent, well-controlled 06/21/2014  .  Pseudomonas aeruginosa colonization 10/08/2012  . Hemorrhage of rectum and anus 07/01/2012  . CAD (coronary artery disease) 11/15/2011  . Hypertension 11/15/2011  . Hyperlipidemia 11/15/2011  . Class 3 obesity due to excess calories with serious comorbidity and body mass index (BMI) of 45.0 to 49.9 in adult 11/15/2011  . Ureteral calculus, right 06/12/2011    [facility] ,MS, CCC-SLP  06/08/2017, 11:50 AM  [facility] [address] [place], [place], [address] Phone: [phone]   Fax:  [phone]   Name: JAVONN GAUGER MRN: [id number] Date of Birth: 1956-04-14

## 2017-06-08 NOTE — Therapy (Deleted)
[facility] [address] [place], [place], [address] Phone: [phone]   Fax:  [phone]  Occupational Therapy Treatment  Patient Details  Name: Todd Rice MRN: [id number] Date of Birth: 01-Sep-1955 Referring Provider: Parkinson's disease   Encounter Date: 06/08/2017  OT End of Session - 06/08/17 1006    Visit Number  8    Number of Visits  11    Date for OT Re-Evaluation  07/14/17    Authorization Type  [employer]    Authorization Time Period  initally scheduled for 2x week x 4 weeks    OT Start Time  0935    OT Stop Time  1015    OT Time Calculation (min)  40 min    Activity Tolerance  Patient tolerated treatment well    Behavior During Therapy  [facility] for tasks assessed/performed       Past Medical History:  Diagnosis Date  . Adult BMI 50.0-59.9 kg/sq m    52.77  . Anal fissure   . Anxiety   . Diabetes mellitus without complication (HCC)    borderline  . GERD (gastroesophageal reflux disease)    30 years ago, maybe from peptic ulcer  . H/O: gout    STABLE  . Hepatic steatosis 10/01/11  . History of kidney stones   . Hyperlipidemia   . Hypertension   . Kidney tumor    right upper kidney tumor, monitoring  . OSA on CPAP   . Parkinson disease ([place]) 01/04/2016  . REM sleep behavior disorder 12/06/2016  . Right ureteral stone   . Vitamin D deficiency   . Weakness     Past Surgical History:  Procedure Laterality Date  . ANAL FISSURE REPAIR  10-12-2000  . COLONOSCOPY N/A 07/01/2012   Procedure: COLONOSCOPY;  Surgeon: Lafayette Dragon, MD;  [facility];  Service: Endoscopy;  Laterality: N/A;  . CYSTOSCOPY WITH RETROGRADE PYELOGRAM, URETEROSCOPY AND STENT PLACEMENT Bilateral 02/26/2013   Procedure: CYSTOSCOPY WITH RETROGRADE PYELOGRAM, URETEROSCOPY AND STENT PLACEMENT;  Surgeon: Alexis Frock, MD;  Location: [facility];  Service: Urology;  Laterality: Bilateral;  . HOLMIUM LASER APPLICATION Bilateral  63/81/7711   Procedure: HOLMIUM LASER APPLICATION;  Surgeon: Alexis Frock, MD;  Location: [facility];  Service: Urology;  Laterality: Bilateral;  . kidney stone removal     06/2011-stent placement  . LEFT MEDIAL FEMORAL CONDYLE DEBRIDEMENT & DRILLING/ REMOVAL LOOSE BODY  09-15-2003  . NASAL SEPTUM SURGERY  1985  . STONE EXTRACTION WITH BASKET  06/12/2011   Procedure: STONE EXTRACTION WITH BASKET;  Surgeon: Claybon Jabs, MD;  Location: [facility];  Service: Urology;  Laterality: Right;    There were no vitals filed for this visit.  Subjective Assessment - 06/08/17 0934    Pertinent History  PD HLD, HTN, DM, obesity    Patient Stated Goals  improve function in right hand    Currently in Pain?  Yes    Pain Score  2     Pain Location  Hip    Pain Orientation  Right    Pain Descriptors / Indicators  Sore    Pain Type  Acute pain    Pain Onset  1 to 4 weeks ago    Pain Frequency  Intermittent    Aggravating Factors   walking    Pain Relieving Factors  meds  OT Education - 06/08/17 1008    Education provided  Yes    Education Details  Progress towards goals, ways to prevent future complications, reviewed coordination HEP,     Person(s) Educated  Patient;Spouse    Methods  Explanation;Demonstration;Verbal cues;Handout    Comprehension  Verbalized understanding;Returned demonstration          OT Long Term Goals - 06/08/17 0940      OT LONG TERM GOAL #1   Title  Pt will demonstrate understanding of PD specific HEP following review.    Status  Achieved      OT LONG TERM GOAL #2   Title  Pt will verbalize understanding of updated adapted strategies for ADLs/IADLs,    Status  Achieved      OT LONG TERM GOAL #3   Title  Pt will demonstrate improved fine motor coordination for ADLS as evidenced by decreasing RUE 9 hole peg test score to 36 secs or less.    Status  Not Met 46.72 secs, 41.40 secs      OT LONG TERM GOAL #4    Title  Pt will verbalize understanding of ways to prevent future complications and appropriate community resources    Status  Achieved      OT LONG TERM GOAL #5   Title  Pt will write a short paragraph with good legibility and no significant decrease in letter size.    Status  Achieved            Plan - 06/08/17 1009    Clinical Impression Statement  Pt demonstrates good overall progress. He agrees with plans for d/c.    Rehab Potential  Good    Current Impairments/barriers affecting progress:  bradykinesia, rigidity    OT Treatment/Interventions  Self-care/ADL training;Moist Heat;Fluidtherapy;DME and/or AE instruction;Balance training;Therapeutic activities;Cognitive remediation/compensation;Therapeutic exercise;Passive range of motion;Functional Mobility Training;Neuromuscular education;Cryotherapy;Paraffin;Energy conservation;Manual Therapy;Patient/family education    Plan  D/C OT       Patient will benefit from skilled therapeutic intervention in order to improve the following deficits and impairments:  Abnormal gait, Decreased range of motion, Difficulty walking, Impaired flexibility, Decreased safety awareness, Decreased endurance, Decreased activity tolerance, Decreased knowledge of precautions, Impaired tone, Impaired UE functional use, Pain, Decreased balance, Decreased cognition, Decreased mobility, Decreased strength  Visit Diagnosis: Other symptoms and signs involving the nervous system  Other lack of coordination  Other symptoms and signs involving the musculoskeletal system    Problem List Patient Active Problem List   Diagnosis Date Noted  . OSA treated with BiPAP 01/03/2017  . REM sleep behavior disorder 12/06/2016  . Uncontrolled secondary diabetes mellitus with stage 3 CKD (GFR 30-59) (HCC) 10/23/2016  . Medication management 10/23/2016  . Vitamin D deficiency 10/23/2016  . Parkinson disease ([place]) 01/04/2016  . Seasonal and perennial allergic rhinitis  06/21/2014  . Asthma, mild intermittent, well-controlled 06/21/2014  . Pseudomonas aeruginosa colonization 10/08/2012  . Hemorrhage of rectum and anus 07/01/2012  . CAD (coronary artery disease) 11/15/2011  . Hypertension 11/15/2011  . Hyperlipidemia 11/15/2011  . Class 3 obesity due to excess calories with serious comorbidity and body mass index (BMI) of 45.0 to 49.9 in adult 11/15/2011  . Ureteral calculus, right 06/12/2011    RINE,KATHRYN 06/08/2017, 12:13 PM  [facility] [address] [place], [place], [address] Phone: [phone]   Fax:  [phone]  Name: Todd Rice MRN: [id number] Date of Birth: [birth year]

## 2017-06-08 NOTE — Therapy (Signed)
[facility] [address] [place], [place], [address] Phone: [phone]   Fax:  [phone]  Occupational Therapy Treatment  Patient Details  Name: Todd Rice MRN: [id number] Date of Birth: 01-Sep-1955 Referring Provider: Parkinson's disease   Encounter Date: 06/08/2017  OT End of Session - 06/08/17 1006    Visit Number  8    Number of Visits  11    Date for OT Re-Evaluation  07/14/17    Authorization Type  [employer]    Authorization Time Period  initally scheduled for 2x week x 4 weeks    OT Start Time  0935    OT Stop Time  1015    OT Time Calculation (min)  40 min    Activity Tolerance  Patient tolerated treatment well    Behavior During Therapy  [facility] for tasks assessed/performed       Past Medical History:  Diagnosis Date  . Adult BMI 50.0-59.9 kg/sq m    52.77  . Anal fissure   . Anxiety   . Diabetes mellitus without complication (HCC)    borderline  . GERD (gastroesophageal reflux disease)    30 years ago, maybe from peptic ulcer  . H/O: gout    STABLE  . Hepatic steatosis 10/01/11  . History of kidney stones   . Hyperlipidemia   . Hypertension   . Kidney tumor    right upper kidney tumor, monitoring  . OSA on CPAP   . Parkinson disease ([place]) 01/04/2016  . REM sleep behavior disorder 12/06/2016  . Right ureteral stone   . Vitamin D deficiency   . Weakness     Past Surgical History:  Procedure Laterality Date  . ANAL FISSURE REPAIR  10-12-2000  . COLONOSCOPY N/A 07/01/2012   Procedure: COLONOSCOPY;  Surgeon: Lafayette Dragon, MD;  [facility];  Service: Endoscopy;  Laterality: N/A;  . CYSTOSCOPY WITH RETROGRADE PYELOGRAM, URETEROSCOPY AND STENT PLACEMENT Bilateral 02/26/2013   Procedure: CYSTOSCOPY WITH RETROGRADE PYELOGRAM, URETEROSCOPY AND STENT PLACEMENT;  Surgeon: Alexis Frock, MD;  [facility];  Service: Urology;  Laterality: Bilateral;  . HOLMIUM LASER APPLICATION Bilateral  63/81/7711   Procedure: HOLMIUM LASER APPLICATION;  Surgeon: Alexis Frock, MD;  Location: [facility];  Service: Urology;  Laterality: Bilateral;  . kidney stone removal     06/2011-stent placement  . LEFT MEDIAL FEMORAL CONDYLE DEBRIDEMENT & DRILLING/ REMOVAL LOOSE BODY  09-15-2003  . NASAL SEPTUM SURGERY  1985  . STONE EXTRACTION WITH BASKET  06/12/2011   Procedure: STONE EXTRACTION WITH BASKET;  Surgeon: Claybon Jabs, MD;  Location: [facility];  Service: Urology;  Laterality: Right;    There were no vitals filed for this visit.  Subjective Assessment - 06/08/17 0934    Pertinent History  PD HLD, HTN, DM, obesity    Patient Stated Goals  improve function in right hand    Currently in Pain?  Yes    Pain Score  2     Pain Location  Hip    Pain Orientation  Right    Pain Descriptors / Indicators  Sore    Pain Type  Acute pain    Pain Onset  1 to 4 weeks ago    Pain Frequency  Intermittent    Aggravating Factors   walking    Pain Relieving Factors  meds  OT Education - 06/08/17 1008    Education provided  Yes    Education Details  Progress towards goals, ways to prevent future complications, reviewed coordination HEP,     Person(s) Educated  Patient;Spouse    Methods  Explanation;Demonstration;Verbal cues;Handout    Comprehension  Verbalized understanding;Returned demonstration          OT Long Term Goals - 06/08/17 0940      OT LONG TERM GOAL #1   Title  Pt will demonstrate understanding of PD specific HEP following review.    Status  Achieved      OT LONG TERM GOAL #2   Title  Pt will verbalize understanding of updated adapted strategies for ADLs/IADLs,    Status  Achieved      OT LONG TERM GOAL #3   Title  Pt will demonstrate improved fine motor coordination for ADLS as evidenced by decreasing RUE 9 hole peg test score to 36 secs or less.    Status  Not Met 46.72 secs, 41.40 secs      OT LONG TERM GOAL #4    Title  Pt will verbalize understanding of ways to prevent future complications and appropriate community resources    Status  Achieved      OT LONG TERM GOAL #5   Title  Pt will write a short paragraph with good legibility and no significant decrease in letter size.    Status  Achieved            Plan - 06/08/17 1009    Clinical Impression Statement  Pt demonstrates good overall progress. He agrees with plans for d/c.    Rehab Potential  Good    Current Impairments/barriers affecting progress:  bradykinesia, rigidity    OT Treatment/Interventions  Self-care/ADL training;Moist Heat;Fluidtherapy;DME and/or AE instruction;Balance training;Therapeutic activities;Cognitive remediation/compensation;Therapeutic exercise;Passive range of motion;Functional Mobility Training;Neuromuscular education;Cryotherapy;Paraffin;Energy conservation;Manual Therapy;Patient/family education    Plan  D/C OT       Patient will benefit from skilled therapeutic intervention in order to improve the following deficits and impairments:  Abnormal gait, Decreased range of motion, Difficulty walking, Impaired flexibility, Decreased safety awareness, Decreased endurance, Decreased activity tolerance, Decreased knowledge of precautions, Impaired tone, Impaired UE functional use, Pain, Decreased balance, Decreased cognition, Decreased mobility, Decreased strength  Visit Diagnosis: Other symptoms and signs involving the nervous system  Other lack of coordination  Other symptoms and signs involving the musculoskeletal system    Problem List Patient Active Problem List   Diagnosis Date Noted  . OSA treated with BiPAP 01/03/2017  . REM sleep behavior disorder 12/06/2016  . Uncontrolled secondary diabetes mellitus with stage 3 CKD (GFR 30-59) (HCC) 10/23/2016  . Medication management 10/23/2016  . Vitamin D deficiency 10/23/2016  . Parkinson disease ([place]) 01/04/2016  . Seasonal and perennial allergic rhinitis  06/21/2014  . Asthma, mild intermittent, well-controlled 06/21/2014  . Pseudomonas aeruginosa colonization 10/08/2012  . Hemorrhage of rectum and anus 07/01/2012  . CAD (coronary artery disease) 11/15/2011  . Hypertension 11/15/2011  . Hyperlipidemia 11/15/2011  . Class 3 obesity due to excess calories with serious comorbidity and body mass index (BMI) of 45.0 to 49.9 in adult 11/15/2011  . Ureteral calculus, right 06/12/2011    RINE,KATHRYN 06/08/2017, 12:10 PM  [facility] [address] [place], [place], [address] Phone: [phone]   Fax:  [phone]  Name: TRIG MCBRYAR MRN: [id number] Date of Birth: 1955/12/04

## 2017-06-08 NOTE — Patient Instructions (Signed)
Ways to prevent future Parkinson's related complications: 1.   Exercise regularly,  2.   Focus on BIGGER movements during daily activities- really reach overhead, straighten elbows and extend fingers. Try to use both hands equally with during bilateral tasks. 3.   When dressing or reaching for your seatbelt make sure to use your body to assist by twisting while you reach-this can help to minimize stress on the shoulder and reduce the risk of a rotator cuff tear 4.   Swing your arms when you walk! People with PD are at increased risk for frozen shoulder and swinging your arms can reduce this risk.

## 2017-06-08 NOTE — Patient Instructions (Signed)
Do your loud "ah"s! Do your face exercises!  See you in August.

## 2017-06-12 ENCOUNTER — Ambulatory Visit: Payer: 59

## 2017-06-21 ENCOUNTER — Other Ambulatory Visit: Payer: Self-pay | Admitting: Internal Medicine

## 2017-06-21 ENCOUNTER — Encounter (INDEPENDENT_AMBULATORY_CARE_PROVIDER_SITE_OTHER): Payer: Self-pay

## 2017-06-21 MED FILL — predniSONE 10 MG TABS: 10 | 11 days supply | Qty: 20 | Fill #0

## 2017-06-27 ENCOUNTER — Ambulatory Visit: Payer: 59 | Admitting: Adult Health

## 2017-06-27 ENCOUNTER — Encounter: Payer: Self-pay | Admitting: Adult Health

## 2017-06-27 VITALS — BP 147/87 | HR 81 | Ht 67.0 in | Wt 279.4 lb

## 2017-06-27 DIAGNOSIS — G4733 Obstructive sleep apnea (adult) (pediatric): Secondary | ICD-10-CM

## 2017-06-27 DIAGNOSIS — Z9989 Dependence on other enabling machines and devices: Secondary | ICD-10-CM

## 2017-06-27 NOTE — Progress Notes (Signed)
Fax confirmation received [facility] [phone], cpap supplies. sy

## 2017-06-27 NOTE — Patient Instructions (Signed)
Your Plan:  Continue using CPAP nightly Mask refitting If your symptoms worsen or you develop new symptoms please let us know.   Thank you for coming to see us at [facility]. I hope we have been able to provide you high quality care today.  You may receive a patient satisfaction survey over the next few weeks. We would appreciate your feedback and comments so that we may continue to improve ourselves and the health of our patients.  

## 2017-06-27 NOTE — Progress Notes (Signed)
I agree with the assessment and plan as directed by NP . This patient may benefit from a fitting of a new mask in reclined or supine position.   Romelle Reiley, MD

## 2017-06-27 NOTE — Progress Notes (Signed)
PATIENT: Todd Rice DOB: 1955-05-18  REASON FOR VISIT: follow up HISTORY FROM: patient  HISTORY OF PRESENT ILLNESS: Today 06/27/17 Todd Rice is a 62 year old male with a history of obstructive sleep apnea on CPAP.  He returns today for follow-up.  His download indicates that he use his machine 30 out of 30 days for compliance of 100%.  He uses machine greater than 4 hours 28 out of 30 days for compliance of 93%.  On average he uses his machine 6 hours and 45 minutes.  His residual AHI is 2.4.  He is on a maximum inspiratory pressure of 17 cm of water and minimum expiratory pressure of 10 cm of water with pressure support of 4 cm of water.  His leak in the 95th percentile is 98 L/min.  He states that often times during the night his mask will ride up into his mouth potentially causing a leak.  He reports that he is tried tightening the straps but continues to rise into his mouth.  He returns today for evaluation.  HISTORY copied from Dr. Edwena Felty note: I had the pleasure to day of seeing Todd Rice again, wed undergone a split night polysomnography at [facility] on 02/24/2016. He was still diagnosed with a high degree of apnea his AHI was 33.6 he slept all night in supine position and he did not enter REM sleep. Oxygen nadir was 88% is only 2 minutes of desaturation and remarkably this patient, who suffers from Parkinson's disease, did not have periodic limb movements. He was titrated to CPAP up to 18 cm water pressure without resolution of apnea. The medical technologist changed him to a BiPAP setting which did resolve apnea at a setting of 20/16 cm water overall he did not sleep very well in the laboratory environment but he would finally able to find a mask that seem to fit him well-  an [employer]. I'm able today to see his compliance download which has a remarkable 100% of use for the last 30 days with 75% compliance for over 4 hours of nightly use consecutively. His  minimum inspiratory pressure is 17 and expiratory pressure 10 cm water, 4 cm pressure support, residual AHI is 2.1 which is a very good resolution. There were no treatment emergent central apneas on the setting.  REVIEW OF SYSTEMS: Out of a complete 14 system review of symptoms, the patient complains only of the following symptoms, and all other reviewed systems are negative.  See HPI  ALLERGIES: Allergies  Allergen Reactions  . Ciprofloxacin Hives  . Ceftriaxone Hives    HOME MEDICATIONS: Outpatient Medications Prior to Visit  Medication Sig Dispense Refill  . allopurinol (ZYLOPRIM) 300 MG tablet TAKE 1 TABLET BY MOUTH DAILY. 90 tablet 2  . ALPRAZolam (XANAX) 0.5 MG tablet TAKE 1 TABLET BY MOUTH THREE TIMES DAILY 90 tablet 2  . aspirin EC 81 MG tablet Take 81 mg by mouth at bedtime.    . clotrimazole-betamethasone (LOTRISONE) cream Apply 1 application topically 2 (two) times daily. 15 g 2  . COLCRYS 0.6 MG tablet Take 1 tablet (0.6 mg total) by mouth 2 (two) times daily. 180 tablet 12  . CRESTOR 10 MG tablet Take 1 tablet (10 mg total) by mouth at bedtime. 90 tablet 3  . EPINEPHrine (EPIPEN 2-PAK) 0.3 mg/0.3 mL IJ SOAJ injection Inject 0.3 mLs (0.3 mg total) into the muscle once. 2 Device 1  . meloxicam (MOBIC) 15 MG tablet Take one daily  with food for 2 weeks, can take with tylenol, can not take with aleve, iburpofen, then as needed daily for pain 30 tablet 1  . metFORMIN (GLUCOPHAGE-XR) 500 MG 24 hr tablet TAKE 2 TABLETS BY MOUTH 2 TIMES A DAY FOR DIABETES 360 tablet 1  . methocarbamol (ROBAXIN) 500 MG tablet TAKE 1 TABLET BY MOUTH EVERY 8 HOURS AS NEEDED FOR MUSCLE SPASMS. 24 tablet 0  . pramipexole (MIRAPEX) 0.75 MG tablet Take 1 tablet (0.75 mg total) by mouth 3 (three) times daily. 270 tablet 2  . predniSONE (DELTASONE) 10 MG tablet TAKE 1 TABLET BY MOUTH 3 TIMES DAILY FOR 3 DAYS, 1 TABLET TWICE DAILY FOR 3 DAYS, THEN 1 TABLET DAILY 20 tablet 0  . rosuvastatin (CRESTOR) 10 MG  tablet Take 1 tablet (10 mg total) by mouth daily. 30 tablet 2  . sitaGLIPtin (JANUVIA) 100 MG tablet TAKE 1 TABLET BY MOUTH ONCE DAILY FOR DIABETES 90 tablet 1  . valsartan-hydrochlorothiazide (DIOVAN-HCT) 320-25 MG tablet TAKE 1 TABLET BY MOUTH ONCE DAILY 90 tablet 1  . VENTOLIN HFA 108 (90 Base) MCG/ACT inhaler INHALE 2 PUFFS INTO THE LUNGS EVERY 6 HOURS AS NEEDED FOR WHEEZING OR SHORTNESS OF BREATH. 54 g 3  . vitamin C (ASCORBIC ACID) 500 MG tablet Take 1 tablet (500 mg total) by mouth 3 (three) times daily. 21 tablet 0  . Vitamin D, Ergocalciferol, (DRISDOL) 50000 units CAPS capsule Take 50,000 Units by mouth every 7 (seven) days.    Marland Kitchen albuterol (PROVENTIL HFA;VENTOLIN HFA) 108 (90 BASE) MCG/ACT inhaler Take 1 to 2 inhalations 4 x daily or every 4 hours if need to rescue asthma 18 g 99   No facility-administered medications prior to visit.     PAST MEDICAL HISTORY: Past Medical History:  Diagnosis Date  . Adult BMI 50.0-59.9 kg/sq m    52.77  . Anal fissure   . Anxiety   . Diabetes mellitus without complication (HCC)    borderline  . GERD (gastroesophageal reflux disease)    30 years ago, maybe from peptic ulcer  . H/O: gout    STABLE  . Hepatic steatosis 10/01/11  . History of kidney stones   . Hyperlipidemia   . Hypertension   . Kidney tumor    right upper kidney tumor, monitoring  . OSA on CPAP   . Parkinson disease ([place]) 01/04/2016  . REM sleep behavior disorder 12/06/2016  . Right ureteral stone   . Vitamin D deficiency   . Weakness     PAST SURGICAL HISTORY: Past Surgical History:  Procedure Laterality Date  . ANAL FISSURE REPAIR  10-12-2000  . COLONOSCOPY N/A 07/01/2012   Procedure: COLONOSCOPY;  Surgeon: Lafayette Dragon, MD;  Location: [facility];  Service: Endoscopy;  Laterality: N/A;  . CYSTOSCOPY WITH RETROGRADE PYELOGRAM, URETEROSCOPY AND STENT PLACEMENT Bilateral 02/26/2013   Procedure: CYSTOSCOPY WITH RETROGRADE PYELOGRAM, URETEROSCOPY AND STENT PLACEMENT;   Surgeon: Alexis Frock, MD;  Location: [facility];  Service: Urology;  Laterality: Bilateral;  . HOLMIUM LASER APPLICATION Bilateral 25/42/7062   Procedure: HOLMIUM LASER APPLICATION;  Surgeon: Alexis Frock, MD;  Location: [facility];  Service: Urology;  Laterality: Bilateral;  . kidney stone removal     06/2011-stent placement  . LEFT MEDIAL FEMORAL CONDYLE DEBRIDEMENT & DRILLING/ REMOVAL LOOSE BODY  09-15-2003  . NASAL SEPTUM SURGERY  1985  . STONE EXTRACTION WITH BASKET  06/12/2011   Procedure: STONE EXTRACTION WITH BASKET;  Surgeon: Claybon Jabs, MD;  Location[facility];  Service: Urology;  Laterality: Right;    FAMILY HISTORY: Family History  Problem Relation Age of Onset  . Heart disease Mother        Atrial fibrillation  . Hypertension Mother   . Hyperlipidemia Mother   . Lung cancer Father        was a smoker  . Cancer Father        lung  . Hyperlipidemia Brother   . Heart disease Maternal Aunt   . Hyperlipidemia Maternal Aunt   . Hypertension Maternal Aunt   . Stroke Maternal Aunt   . Heart disease Paternal Grandmother   . Hyperlipidemia Paternal Grandmother     SOCIAL HISTORY: Social History   Socioeconomic History  . Marital status: Married    Spouse name: Not on file  . Number of children: 0  . Years of education: Not on file  . Highest education level: Not on file  Social Needs  . Financial resource strain: Not on file  . Food insecurity - worry: Not on file  . Food insecurity - inability: Not on file  . Transportation needs - medical: Not on file  . Transportation needs - non-medical: Not on file  Occupational History  . Not on file  Tobacco Use  . Smoking status: Never Smoker  . Smokeless tobacco: Never Used  Substance and Sexual Activity  . Alcohol use: No  . Drug use: No    Comment: QUIT SMOKING " POT" 40 YRS AGO  . Sexual activity: Not on file  Other Topics Concern  . Not on file  Social History Narrative   Right-handed.   No  caffeine use.   Lives at home with his wife.      PHYSICAL EXAM  Vitals:   06/27/17 1358  Weight: 279 lb 6.4 oz (126.7 kg)  Height: 5\' 7"  (1.702 m)   Body mass index is 43.76 kg/m.  Generalized: Well developed, in no acute distress   Neurological examination  Mentation: Alert oriented to time, place, history taking. Follows all commands speech and language fluent Cranial nerve II-XII: Pupils were equal round reactive to light. Extraocular movements were full, visual field were full on confrontational test. Facial sensation and strength were normal. Uvula tongue midline. Head turning and shoulder shrug  were normal and symmetric. Motor: The motor testing reveals 5 over 5 strength of all 4 extremities. Good symmetric motor tone is noted throughout.  Sensory: Sensory testing is intact to soft touch on all 4 extremities. No evidence of extinction is noted.  Coordination: Cerebellar testing reveals good finger-nose-finger and heel-to-shin bilaterally.  Gait and station: Gait is normal.  Reflexes: Deep tendon reflexes are symmetric and normal bilaterally.   DIAGNOSTIC DATA (LABS, IMAGING, TESTING) - I reviewed patient records, labs, notes, testing and imaging myself where available.  Lab Results  Component Value Date   WBC 11.4 (H) 04/12/2017   HGB 15.0 04/12/2017   HCT 44.3 04/12/2017   MCV 81.3 04/12/2017   PLT 184 04/12/2017      Component Value Date/Time   NA 139 04/12/2017 1711   K 3.7 04/12/2017 1711   CL 103 04/12/2017 1711   CO2 24 04/12/2017 1711   GLUCOSE 117 (H) 04/12/2017 1711   BUN 23 04/12/2017 1711   CREATININE 1.09 04/12/2017 1711   CALCIUM 10.3 04/12/2017 1711   PROT 7.5 04/12/2017 1711   ALBUMIN 3.8 10/24/2016 1601   AST 14 04/12/2017 1711   ALT 10 04/12/2017 1711   ALKPHOS 58 10/24/2016 1601  BILITOT 0.4 04/12/2017 1711   GFRNONAA 72 04/12/2017 1711   GFRAA 84 04/12/2017 1711   Lab Results  Component Value Date   CHOL 189 04/12/2017   HDL 29  (L) 04/12/2017   LDLCALC NOT CALC 10/24/2016   TRIG 364 (H) 04/12/2017   CHOLHDL 6.5 (H) 04/12/2017   Lab Results  Component Value Date   HGBA1C 6.6 (H) 04/12/2017   Lab Results  Component Value Date   VITAMINB12 570 01/03/2017   Lab Results  Component Value Date   TSH 2.28 04/12/2017      ASSESSMENT AND PLAN 62 y.o. year old male  has a past medical history of Adult BMI 50.0-59.9 kg/sq m, Anal fissure, Anxiety, Diabetes mellitus without complication ([place]), GERD (gastroesophageal reflux disease), H/O: gout, Hepatic steatosis (10/01/11), History of kidney stones, Hyperlipidemia, Hypertension, Kidney tumor, OSA on CPAP, Parkinson disease ([place]) (01/04/2016), REM sleep behavior disorder (12/06/2016), Right ureteral stone, Vitamin D deficiency, and Weakness. here with:  1. OSA on CPAP  The patient CPAP download shows good compliance but the patient has a high leak.  As in order to his DME company for mask refitting.  He is advised that if his symptoms worsen or he develops new symptoms he should let us know.  He will follow-up in 6 months or sooner if needed.  I spent 15 minutes with the patient. 50% of this time was spent reviewing CPAP download  Ward Givens, MSN, NP-C 06/27/2017, 2:03 PM [facility] [address] [place], [place] [address] [phone]

## 2017-06-28 ENCOUNTER — Telehealth: Payer: Self-pay | Admitting: Internal Medicine

## 2017-06-28 ENCOUNTER — Encounter: Payer: Self-pay | Admitting: Internal Medicine

## 2017-06-28 NOTE — Telephone Encounter (Signed)
[facility] CPAP [facility]. Called patient and confirmed CPAP DME company is [employer]. Called [facility] to advise order for mask refit coming from Dr Morene Rankins Dohmeier's office, per 06-27-17 office note. Faxed order to [place].

## 2017-07-11 MED FILL — ALPRAZolam 0.5 MG TABS: 0.5 | 30 days supply | Qty: 90 | Fill #1

## 2017-07-12 ENCOUNTER — Other Ambulatory Visit: Payer: Self-pay | Admitting: Physician Assistant

## 2017-07-12 ENCOUNTER — Other Ambulatory Visit: Payer: Self-pay | Admitting: Neurology

## 2017-07-12 MED FILL — CARBIDOPA-LEVODOPA 25-100 T: 25-100 | 90 days supply | Qty: 270 | Fill #0

## 2017-07-12 MED FILL — JANUVIA 100 MG TABLET: 100 | 60 days supply | Qty: 60 | Fill #1

## 2017-07-12 MED FILL — VALSARTAN-HCTZ 320-25 MG TA: 320-25 | 60 days supply | Qty: 60 | Fill #4

## 2017-07-12 MED FILL — ROSUVASTATIN CALCIUM 10 MG: 10 | 90 days supply | Qty: 90 | Fill #0

## 2017-07-17 ENCOUNTER — Encounter: Payer: Self-pay | Admitting: Physician Assistant

## 2017-07-17 ENCOUNTER — Ambulatory Visit: Payer: 59 | Admitting: Physician Assistant

## 2017-07-17 VITALS — BP 126/82 | HR 80 | Temp 97.5°F | Resp 18 | Ht 67.0 in | Wt 284.0 lb

## 2017-07-17 DIAGNOSIS — M109 Gout, unspecified: Secondary | ICD-10-CM

## 2017-07-17 DIAGNOSIS — I10 Essential (primary) hypertension: Secondary | ICD-10-CM | POA: Diagnosis not present

## 2017-07-17 DIAGNOSIS — E782 Mixed hyperlipidemia: Secondary | ICD-10-CM | POA: Diagnosis not present

## 2017-07-17 DIAGNOSIS — Z79899 Other long term (current) drug therapy: Secondary | ICD-10-CM

## 2017-07-17 DIAGNOSIS — G20 Parkinson's disease: Secondary | ICD-10-CM

## 2017-07-17 DIAGNOSIS — E1122 Type 2 diabetes mellitus with diabetic chronic kidney disease: Secondary | ICD-10-CM | POA: Diagnosis not present

## 2017-07-17 DIAGNOSIS — N183 Chronic kidney disease, stage 3 (moderate): Secondary | ICD-10-CM | POA: Diagnosis not present

## 2017-07-17 NOTE — Patient Instructions (Signed)
Your A1C is a measure of your sugar over the past 3 months and is not affected by what you have eaten over the past few days. Diabetes increases your chances of stroke and heart attack over 300 % and is the leading cause of blindness and kidney failure in the [place]. Please make sure you decrease bad carbs like white bread, white rice, potatoes, corn, soft drinks, pasta, cereals, refined sugars, sweet tea, dried fruits, and fruit juice. Good carbs are okay to eat in moderation like sweet potatoes, brown rice, whole grain pasta/bread, most fruit (except dried fruit) and you can eat as many veggies as you want.   Greater than 6.5 is considered diabetic. Between 6.4 and 5.7 is prediabetic If your A1C is less than 5.7 you are NOT diabetic.  Targets for Glucose Readings: Time of Check Target for patients WITHOUT Diabetes Target for DIABETICS  Before Meals Less than 100  less than 150  Two hours after meals Less than 200  Less than 250       When it comes to diets, agreement about the perfect plan isn't easy to find, even among the experts. Experts at the [employer] developed an idea known as the Healthy Eating Plate. Just imagine a plate divided into logical, healthy portions.  The emphasis is on diet quality:  Load up on vegetables and fruits - one-half of your plate: Aim for color and variety, and remember that potatoes don't count.  Go for whole grains - one-quarter of your plate: Whole wheat, barley, wheat berries, quinoa, oats, brown rice, and foods made with them. If you want pasta, go with whole wheat pasta.  Protein power - one-quarter of your plate: Fish, chicken, beans, and nuts are all healthy, versatile protein sources. Limit red meat.  The diet, however, does go beyond the plate, offering a few other suggestions.  Use healthy plant oils, such as olive, canola, soy, corn, sunflower and peanut. Check the labels, and avoid partially hydrogenated oil,  which have unhealthy trans fats.  If you're thirsty, drink water. Coffee and tea are good in moderation, but skip sugary drinks and limit milk and dairy products to one or two daily servings.  The type of carbohydrate in the diet is more important than the amount. Some sources of carbohydrates, such as vegetables, fruits, whole grains, and beans-are healthier than others.  Finally, stay active.  Check out  Mini habits for weight loss book  2 apps for tracking food is myfitness pal  loseit OR can take picture of your food  8 Critical Weight-Loss Tips That Aren't Diet and Exercise  1. STARVE THE DISTRACTIONS  All too often when we eat, we're also multitasking: watching TV, answering emails, scrolling through social media. These habits are detrimental to having a strong, clear, healthy relationship with food, and they can hinder our ability to make dietary changes.  In order to truly focus on what you're eating, how much you're eating, why you're eating those specific foods and, most importantly, how those foods make you feel, you need to starve the distractions. That means when you eat, just eat. Focus on your food, the process it went through to end up on your plate, where it came from and how it nourishes you. With this technique, you're more likely to finish a meal feeling satiated.  2.  CONSIDER WHAT YOU'RE NOT WILLING TO DO  This might sound counterintuitive, but it can help provide a "why" when motivation is  waning. Declare, in writing, what you are unwilling to do, for example "I am unwilling to be the old dad who cannot play sports with my children".  So consider what you're not willing to accept, write it down, and keep it at the ready.  3.  STOP LABELING FOOD "GOOD" AND "BAD"  You've probably heard someone say they ate something "bad." Maybe you've even said it yourself.  The trouble with 'bad' foods isn't that they'll send you to the grave after a bite or two. The trouble  comes when we eat excessive portions of really calorie-dense foods meal after meal, day after day.  Instead of labeling foods as good or bad, think about which foods you can eat a lot of, and which ones you should just eat a little of. Then, plan ways to eat the foods you really like in portions that fit with your overall goals. A good example of this would be having a slice of pizza alongside a club salad with chicken breast, avocado and a bit of dressing. This is vastly different than 3 slices of pizza, 4 breadsticks with cheese sauce and half of a liter of regular soda.  4.  BRUSH YOUR TEETH AFTER YOU EAT  Getting your mindset in order is important, but sometimes small habits can make a big difference. After eating, you still have the taste of food in their mouth, which often causes people to eat more even if they are full or engage in a nibble or two of dessert.  Brushing your teeth will remove the taste of food from your mouth, and the clean, minty freshness will serve as a cue that mealtime is over.  5.  FOCUS ON CROWDING NOT CUTTING  The most common first step during 'dieting' is to cut. We cut our portion sizes down, we cut out 'bad' foods, we cut out entire food groups. This act of cutting puts [place] and our minds into scarcity mode.  When something is off-limits, even if you're able to avoid it for a while, you could end up bingeing on it later because you've gone so long without it. So, instead of cutting, focus on crowding. If you crowd your plate and fill it up with more foods like veggies and protein, it simply allows less room for the other stuff. In other words, shift your focus away from what you can't eat, and celebrate the foods that will help you reach your goals.  6.  TAKE TRACKING A STEP FURTHER  Track what you eat, when you ate it, how much you ate and how that food made you feel. Being completely honest with yourself and writing down every single thing that passes through your  lips will help you start to notice that maybe you actually do snack, possibly take in more sugar than you thought, eat when you're bored rather than just hungry or maybe that you have a habit of snacking before bed while watching TV.  The difference from simply tracking your food intake is you're taking into account how food makes you feel, as well as what you're doing while you're eating. This is about becoming more mindful of what, when and why you eat.  7.  PRIORITIZE GOOD SLEEP  One of the strongest risk factors for being overweight is poor sleep. When you're feeling tired, you're more likely to choose unhealthy comfort foods and to skip your workout. Additionally, sleep deprivation may slow down your metabolism. [employer]! Therefore, sleeping 7-8 hours per night  can help with weight loss without having to change your diet or increase your physical activity. And if you feel you snore and still wake up tired, talk with me about sleep apnea.  8.  SET ASIDE TIME TO DISCONNECT  Just get out there. Disconnect from the electronics and connect to the elements. Not only will this help reduce stress (a major factor in weight gain) by giving your mind a break from the constant stimulation we've all become so accustomed to, but it may also reprogram your brain to connect with yourself and what you're feeling.

## 2017-07-17 NOTE — Progress Notes (Signed)
Assessment and Plan:  Hypertension:  -Continue medication,  -monitor blood pressure at home.  -Continue DASH diet.   -Reminder to go to the ER if any CP, SOB, nausea, dizziness, severe HA, changes vision/speech, left arm numbness and tingling, and jaw pain.  Cholesterol: -Continue diet and exercise.  -Check cholesterol.   Diabetes: -recommend that if further worsening may need medication -try for weight loss for help with parkinsons -Continue diet and exercise.  -Check A1C  Parkinsons -cont medications -followed by neuro -some improvements  RCC -may need nephrectomy, just monitoring for now- getting CT with Dr. Tammi Klippel   Gout  - recheck Uric acid as needed, Diet discussed, continue medications.   Continue diet and meds as discussed. Further disposition pending results of labs. Future Appointments  Date Time Provider [place]  10/22/2017  8:45 AM [name], NP [facility] None  11/06/2017  8:00 AM Kathrynn Ducking, MD [facility] None  12/27/2017  8:30 AM Ward Givens, NP [facility] None  01/17/2018  8:00 AM Sasser, Jeanie Cooks, PT [facility]  01/17/2018  8:15 AM Rine, Selmer Dominion, OT [facility]  01/17/2018  8:30 AM Sharen Counter, CCC-SLP [facility]  01/21/2018  9:00 AM Vicie Mutters, PA-C [facility] None    HPI 62 y.o. male  presents for 3 month follow up with hypertension, hyperlipidemia, prediabetes and vitamin D.  Saw Caryl Pina for back strain 01/2017, states not better. Dr. [name] got xrays, had OA in lumbar and hip. He has been on 3 rounds of prednisone since Jan, one for back, one for gout.    His blood pressure has been controlled at home, today their BP is BP: 126/82.   He does not workout. He denies chest pain, shortness of breath, dizziness.   He is on cholesterol medication and denies myalgias. His cholesterol is at goal. The cholesterol last visit was:   Lab Results  Component Value Date   CHOL 189 04/12/2017   HDL 29 (L) 04/12/2017   LDLCALC  NOT CALC 10/24/2016   TRIG 364 (H) 04/12/2017   CHOLHDL 6.5 (H) 04/12/2017   He has been working on diet and exercise for Diabetes with diabetic chronic kidney disease, he is on bASA, he is not on ACE/ARB, and denies paresthesia of the feet, polydipsia, polyuria and visual disturbances. Last A1C was:  Lab Results  Component Value Date   HGBA1C 6.6 (H) 04/12/2017   Lab Results  Component Value Date   GFRNONAA 72 04/12/2017   Patient is on Vitamin D supplement.  Lab Results  Component Value Date   VD25OH 28 (L) 04/12/2017     Patient reports that he has been following with neurology and has been doing physical therapy for his parkinsons.  He reports that his right hand has still been very difficult.  He reports that he has been taking the mirapex and sinemet.   He is following with nephrology for possible RCC, it shrunk on its own so they will monitor it next year.   Patient is on allopurinol for gout and does report a recent flare.  Lab Results  Component Value Date   LABURIC 4.6 07/13/2016   BMI is Body mass index is 44.48 kg/m., he is working on diet and exercise. He is on a CPAP machine, uses regularly.  Wt Readings from Last 3 Encounters:  07/17/17 284 lb (128.8 kg)  06/27/17 279 lb 6.4 oz (126.7 kg)  05/14/17 285 lb (129.3 kg)    Current Medications:  Current Outpatient Medications  on File Prior to Visit  Medication Sig Dispense Refill  . allopurinol (ZYLOPRIM) 300 MG tablet TAKE 1 TABLET BY MOUTH DAILY. 90 tablet 2  . ALPRAZolam (XANAX) 0.5 MG tablet TAKE 1 TABLET BY MOUTH THREE TIMES DAILY 90 tablet 2  . aspirin EC 81 MG tablet Take 81 mg by mouth at bedtime.    . carbidopa-levodopa (SINEMET IR) 25-100 MG tablet TAKE 1 TABLET BY MOUTH 3 TIMES DAILY. 270 tablet 2  . clotrimazole-betamethasone (LOTRISONE) cream Apply 1 application topically 2 (two) times daily. 15 g 2  . COLCRYS 0.6 MG tablet Take 1 tablet (0.6 mg total) by mouth 2 (two) times daily. 180 tablet 12  .  CRESTOR 10 MG tablet Take 1 tablet (10 mg total) by mouth at bedtime. 90 tablet 3  . EPINEPHrine (EPIPEN 2-PAK) 0.3 mg/0.3 mL IJ SOAJ injection Inject 0.3 mLs (0.3 mg total) into the muscle once. 2 Device 1  . meloxicam (MOBIC) 15 MG tablet Take one daily with food for 2 weeks, can take with tylenol, can not take with aleve, iburpofen, then as needed daily for pain 30 tablet 1  . metFORMIN (GLUCOPHAGE-XR) 500 MG 24 hr tablet TAKE 2 TABLETS BY MOUTH 2 TIMES A DAY FOR DIABETES 360 tablet 1  . methocarbamol (ROBAXIN) 500 MG tablet TAKE 1 TABLET BY MOUTH EVERY 8 HOURS AS NEEDED FOR MUSCLE SPASMS. 24 tablet 0  . pramipexole (MIRAPEX) 0.75 MG tablet Take 1 tablet (0.75 mg total) by mouth 3 (three) times daily. 270 tablet 2  . sitaGLIPtin (JANUVIA) 100 MG tablet TAKE 1 TABLET BY MOUTH ONCE DAILY FOR DIABETES 90 tablet 1  . valsartan-hydrochlorothiazide (DIOVAN-HCT) 320-25 MG tablet TAKE 1 TABLET BY MOUTH ONCE DAILY 90 tablet 1  . VENTOLIN HFA 108 (90 Base) MCG/ACT inhaler INHALE 2 PUFFS INTO THE LUNGS EVERY 6 HOURS AS NEEDED FOR WHEEZING OR SHORTNESS OF BREATH. 54 g 3  . vitamin C (ASCORBIC ACID) 500 MG tablet Take 1 tablet (500 mg total) by mouth 3 (three) times daily. 21 tablet 0  . Vitamin D, Ergocalciferol, (DRISDOL) 50000 units CAPS capsule Take 50,000 Units by mouth every 7 (seven) days.    Marland Kitchen albuterol (PROVENTIL HFA;VENTOLIN HFA) 108 (90 BASE) MCG/ACT inhaler Take 1 to 2 inhalations 4 x daily or every 4 hours if need to rescue asthma 18 g 99   No current facility-administered medications on file prior to visit.     Medical History:  Past Medical History:  Diagnosis Date  . Adult BMI 50.0-59.9 kg/sq m    52.77  . Anal fissure   . Anxiety   . Diabetes mellitus without complication (HCC)    borderline  . GERD (gastroesophageal reflux disease)    30 years ago, maybe from peptic ulcer  . H/O: gout    STABLE  . Hepatic steatosis 10/01/11  . History of kidney stones   . Hyperlipidemia   .  Hypertension   . Kidney tumor    right upper kidney tumor, monitoring  . OSA on CPAP    [facility]  . Parkinson disease ([place]) 01/04/2016  . REM sleep behavior disorder 12/06/2016  . Right ureteral stone   . Vitamin D deficiency   . Weakness     Allergies:  Allergies  Allergen Reactions  . Ciprofloxacin Hives  . Ceftriaxone Hives     Review of Systems:  Review of Systems  Constitutional: Negative for chills, fever and malaise/fatigue.  HENT: Negative for congestion, ear pain and sore throat.  Eyes: Negative.   Respiratory: Negative for cough, shortness of breath and wheezing.   Cardiovascular: Negative for chest pain, palpitations and leg swelling.  Gastrointestinal: Negative for abdominal pain, blood in stool, constipation, diarrhea, heartburn and melena.  Genitourinary: Negative.   Skin: Negative.   Neurological: Negative for dizziness, sensory change, loss of consciousness and headaches.  Psychiatric/Behavioral: Negative for depression. The patient is not nervous/anxious and does not have insomnia.     Family history- Review and unchanged  Social history- Review and unchanged  Physical Exam: BP 126/82   Pulse 80   Temp (!) 97.5 F (36.4 C)   Resp 18   Ht 5\' 7"  (1.702 m)   Wt 284 lb (128.8 kg)   SpO2 98%   BMI 44.48 kg/m  Wt Readings from Last 3 Encounters:  07/17/17 284 lb (128.8 kg)  06/27/17 279 lb 6.4 oz (126.7 kg)  05/14/17 285 lb (129.3 kg)    General Appearance: Well nourished well developed, in no apparent distress. Eyes: PERRLA, EOMs, conjunctiva no swelling or erythema ENT/Mouth: Ear canals normal without obstruction, swelling, erythma, discharge.  TMs normal bilaterally.  Oropharynx moist, clear, without exudate, or postoropharyngeal swelling. Neck: Supple, thyroid normal,no cervical adenopathy  Respiratory: Respiratory effort normal, Breath sounds clear A&P without rhonchi, wheeze, or rale.  No retractions, no accessory usage. Cardio: RRR with  no MRGs. Brisk peripheral pulses without edema.  Abdomen: Soft, + BS,  Non tender, no guarding, rebound, hernias, masses. Musculoskeletal: Full ROM, 5/5 strength, Normal gait Skin: Warm, dry without rashes, lesions, ecchymosis.  Neuro: Awake and oriented X 3, Cranial nerves intact. Normal muscle tone, no cerebellar symptoms. Psych: Normal affect, Insight and Judgment appropriate.    Vicie Mutters, PA-C 8:48 AM [facility]

## 2017-07-18 LAB — HEPATIC FUNCTION PANEL
AG RATIO: 1.3 (calc) (ref 1.0–2.5)
ALKALINE PHOSPHATASE (APISO): 48 U/L (ref 40–115)
ALT: 9 U/L (ref 9–46)
AST: 14 U/L (ref 10–35)
Albumin: 4.2 g/dL (ref 3.6–5.1)
Bilirubin, Direct: 0.1 mg/dL (ref 0.0–0.2)
GLOBULIN: 3.2 g/dL (ref 1.9–3.7)
Indirect Bilirubin: 0.7 mg/dL (calc) (ref 0.2–1.2)
Total Bilirubin: 0.8 mg/dL (ref 0.2–1.2)
Total Protein: 7.4 g/dL (ref 6.1–8.1)

## 2017-07-18 LAB — BASIC METABOLIC PANEL WITH GFR
BUN: 17 mg/dL (ref 7–25)
CHLORIDE: 101 mmol/L (ref 98–110)
CO2: 27 mmol/L (ref 20–32)
CREATININE: 1.16 mg/dL (ref 0.70–1.25)
Calcium: 10.1 mg/dL (ref 8.6–10.3)
GFR, Est African American: 77 mL/min/{1.73_m2} (ref >=60)
GFR, Est Non African American: 67 mL/min/{1.73_m2} (ref >=60)
Glucose, Bld: 155 mg/dL — ABNORMAL HIGH (ref 65–99)
Potassium: 4 mmol/L (ref 3.5–5.3)
SODIUM: 139 mmol/L (ref 135–146)

## 2017-07-18 LAB — CBC WITH DIFFERENTIAL/PLATELET
BASOS ABS: 16 {cells}/uL (ref 0–200)
Basophils Relative: 0.2 %
EOS ABS: 122 {cells}/uL (ref 15–500)
Eosinophils Relative: 1.5 %
HCT: 45.3 % (ref 38.5–50.0)
Hemoglobin: 15.6 g/dL (ref 13.2–17.1)
LYMPHS ABS: 1369 {cells}/uL (ref 850–3900)
MCH: 27.8 pg (ref 27.0–33.0)
MCHC: 34.4 g/dL (ref 32.0–36.0)
MCV: 80.6 fL (ref 80.0–100.0)
MPV: 10.7 fL (ref 7.5–12.5)
Monocytes Relative: 8.7 %
NEUTROS PCT: 72.7 %
Neutro Abs: 5889 cells/uL (ref 1500–7800)
PLATELETS: 179 10*3/uL (ref 140–400)
RBC: 5.62 10*6/uL (ref 4.20–5.80)
RDW: 14.3 % (ref 11.0–15.0)
TOTAL LYMPHOCYTE: 16.9 %
WBC: 8.1 10*3/uL (ref 3.8–10.8)
WBCMIX: 705 {cells}/uL (ref 200–950)

## 2017-07-18 LAB — LIPID PANEL
CHOL/HDL RATIO: 5.9 (calc) — AB (ref <5.0)
CHOLESTEROL: 182 mg/dL (ref <200)
HDL: 31 mg/dL — AB (ref >40)
LDL Cholesterol (Calc): 110 mg/dL (calc) — ABNORMAL HIGH
Non-HDL Cholesterol (Calc): 151 mg/dL (calc) — ABNORMAL HIGH (ref <130)
Triglycerides: 306 mg/dL — ABNORMAL HIGH (ref <150)

## 2017-07-18 LAB — URIC ACID: Uric Acid, Serum: 6.5 mg/dL (ref 4.0–8.0)

## 2017-07-18 LAB — HEMOGLOBIN A1C
EAG (MMOL/L): 8.5 (calc)
Hgb A1c MFr Bld: 7 % of total Hgb — ABNORMAL HIGH (ref <5.7)
MEAN PLASMA GLUCOSE: 154 (calc)

## 2017-07-18 LAB — TSH: TSH: 3.03 m[IU]/L (ref 0.40–4.50)

## 2017-07-31 DIAGNOSIS — H524 Presbyopia: Secondary | ICD-10-CM | POA: Diagnosis not present

## 2017-08-07 DIAGNOSIS — C651 Malignant neoplasm of right renal pelvis: Secondary | ICD-10-CM | POA: Diagnosis not present

## 2017-08-07 DIAGNOSIS — N201 Calculus of ureter: Secondary | ICD-10-CM | POA: Diagnosis not present

## 2017-08-10 DIAGNOSIS — C651 Malignant neoplasm of right renal pelvis: Secondary | ICD-10-CM | POA: Diagnosis not present

## 2017-08-10 DIAGNOSIS — K760 Fatty (change of) liver, not elsewhere classified: Secondary | ICD-10-CM | POA: Diagnosis not present

## 2017-08-16 DIAGNOSIS — D4101 Neoplasm of uncertain behavior of right kidney: Secondary | ICD-10-CM | POA: Diagnosis not present

## 2017-08-16 DIAGNOSIS — N200 Calculus of kidney: Secondary | ICD-10-CM | POA: Diagnosis not present

## 2017-08-16 DIAGNOSIS — R972 Elevated prostate specific antigen [PSA]: Secondary | ICD-10-CM | POA: Diagnosis not present

## 2017-08-16 DIAGNOSIS — N3020 Other chronic cystitis without hematuria: Secondary | ICD-10-CM | POA: Diagnosis not present

## 2017-08-16 LAB — POC URINALSYSI DIPSTICK (AUTOMATED)
Bilirubin, UA: NEGATIVE
Blood, UA: NEGATIVE
Glucose, UA: NEGATIVE
Ketones, UA: NEGATIVE
Nitrite, UA: NEGATIVE
Protein, UA: NEGATIVE
Spec Grav, UA: 1.02 (ref 1.010–1.025)
Urobilinogen, UA: 0.2 E.U./dL
pH, UA: 5.5 (ref 5.0–8.0)

## 2017-08-23 DIAGNOSIS — G4733 Obstructive sleep apnea (adult) (pediatric): Secondary | ICD-10-CM | POA: Diagnosis not present

## 2017-09-05 ENCOUNTER — Other Ambulatory Visit: Payer: Self-pay | Admitting: Internal Medicine

## 2017-09-05 ENCOUNTER — Other Ambulatory Visit: Payer: Self-pay | Admitting: Adult Health

## 2017-09-05 MED FILL — JANUVIA 100 MG TABLET: 100 | 60 days supply | Qty: 60 | Fill #2

## 2017-09-05 MED FILL — VALSARTAN-HCTZ 320-25 MG TA: 320-25 | 90 days supply | Qty: 90 | Fill #0

## 2017-09-05 MED FILL — ALLOPURINOL 300 MG TABS: 300 | 90 days supply | Qty: 90 | Fill #0

## 2017-09-05 MED FILL — METFORMIN HCL ER 500 MG TAB: 500 | 90 days supply | Qty: 360 | Fill #1

## 2017-09-05 MED FILL — ALPRAZolam 0.5 MG TABS: 0.5 | 30 days supply | Qty: 90 | Fill #2

## 2017-10-09 MED FILL — CARBIDOPA-LEVODOPA 25-100 T: 25-100 | 90 days supply | Qty: 270 | Fill #1

## 2017-10-21 NOTE — Progress Notes (Signed)
FOLLOW UP  Assessment and Plan:   Hypertension Well controlled with current medications  Monitor blood pressure at home; patient to call if consistently greater than 130/80 Continue DASH diet.   Reminder to go to the ER if any CP, SOB, nausea, dizziness, severe HA, changes vision/speech, left arm numbness and tingling and jaw pain.  Cholesterol Currently above goal; discussed goal <100 LDL, will increase to rosuvastatin 20 mg pending lab results Continue low cholesterol diet and exercise.  Check lipid panel.   Diabetes with diabetic chronic kidney disease Continue medication: metformin, januvia Continue diet and exercise.  Perform daily foot/skin check, notify office of any concerning changes.  Check A1C  Obesity with co morbidities Long discussion about weight loss, diet, and exercise Recommended diet heavy in fruits and veggies and low in animal meats, cheeses, and dairy products, appropriate calorie intake Discussed ideal weight for height and initial weight goal (280 lb) Patient will work on cutting down on sweets Will follow up in 3 months  Vitamin D Def Below goal at last visit; continue supplementation for goal of 70-100 Check Vit D level  Gout Continue allopurinol Diet discussed Check uric acid as needed   Continue diet and meds as discussed. Further disposition pending results of labs. Discussed med's effects and SE's.   Over 30 minutes of exam, counseling, chart review, and critical decision making was performed.   Future Appointments  Date Time Provider [place]  11/06/2017  8:00 AM Kathrynn Ducking, MD [facility] None  12/27/2017  8:30 AM Ward Givens, NP [facility] None  01/21/2018  9:00 AM Vicie Mutters, PA-C [facility] None  04/02/2018  1:15 PM Delton Prairie, OT [facility]  04/02/2018  1:30 PM Frazier Butt, PT [facility]  04/02/2018  1:45 PM Schinke, [place], CCC-SLP [facility]     ----------------------------------------------------------------------------------------------------------------------  HPI 62 y.o. male  presents for 3 month follow up on hypertension, cholesterol, diabetes, morbid obeisty and vitamin D deficiency. Patient is on sinemet for Parkinson's, has OSA on CPAP, followed by neurology. Patient is followed since July 2014 for a R Renal Neoplasm by active surveillance (Dr Phebe Colla).   he has a diagnosis of anxiety and is currently on xanax 0.5 mg PRN, reports symptoms are well controlled on current regimen. he currently feels well managed, takes 3-4 times a week.   BMI is Body mass index is 45.42 kg/m., he has not been working on diet and exercise, but he plans on cutting down on sweets, and walk every other day.  Wt Readings from Last 3 Encounters:  10/22/17 290 lb (131.5 kg)  07/17/17 284 lb (128.8 kg)  06/27/17 279 lb 6.4 oz (126.7 kg)   His blood pressure has been controlled at home, today their BP is BP: 124/80  He does workout. He denies chest pain, shortness of breath, dizziness.   He is on cholesterol medication (crestor 10 mg daily) and denies myalgias. His cholesterol is not at goal. The cholesterol last visit was:  Lab Results  Component Value Date   CHOL 182 07/17/2017   HDL 31 (L) 07/17/2017   LDLCALC 110 (H) 07/17/2017   TRIG 306 (H) 07/17/2017   CHOLHDL 5.9 (H) 07/17/2017    He has not been working on diet and exercise for T2 diabetes treated by metformin and januvia, and denies foot ulcerations, hyperglycemia, hypoglycemia , increased appetite, nausea, paresthesia of the feet, polydipsia, polyuria, visual disturbances, vomiting and weight loss. He does check sugars at fasting (avg. 120-150)  and 4-4:30 (avg. 70-105). Last A1C in the office was:  Lab Results  Component Value Date   HGBA1C 7.0 (H) 07/17/2017   Patient is on Vitamin D supplement (10000 IU) but admits he forgets to take frequently:  Lab Results  Component  Value Date   VD25OH 28 (L) 04/12/2017     Patient is on allopurinol for gout and does not report a recent flare.  Lab Results  Component Value Date   LABURIC 6.5 07/17/2017      Current Medications:  Current Outpatient Medications on File Prior to Visit  Medication Sig  . albuterol (PROVENTIL HFA;VENTOLIN HFA) 108 (90 BASE) MCG/ACT inhaler Take 1 to 2 inhalations 4 x daily or every 4 hours if need to rescue asthma  . allopurinol (ZYLOPRIM) 300 MG tablet TAKE 1 TABLET BY MOUTH ONCE DAILY  . ALPRAZolam (XANAX) 0.5 MG tablet TAKE 1 TABLET BY MOUTH THREE TIMES DAILY  . aspirin EC 81 MG tablet Take 81 mg by mouth at bedtime.  . carbidopa-levodopa (SINEMET IR) 25-100 MG tablet TAKE 1 TABLET BY MOUTH 3 TIMES DAILY.  . clotrimazole-betamethasone (LOTRISONE) cream Apply 1 application topically 2 (two) times daily.  Marland Kitchen COLCRYS 0.6 MG tablet Take 1 tablet (0.6 mg total) by mouth 2 (two) times daily.  . CRESTOR 10 MG tablet Take 1 tablet (10 mg total) by mouth at bedtime.  Marland Kitchen EPINEPHrine (EPIPEN 2-PAK) 0.3 mg/0.3 mL IJ SOAJ injection Inject 0.3 mLs (0.3 mg total) into the muscle once.  . meloxicam (MOBIC) 15 MG tablet Take one daily with food for 2 weeks, can take with tylenol, can not take with aleve, iburpofen, then as needed daily for pain  . metFORMIN (GLUCOPHAGE-XR) 500 MG 24 hr tablet TAKE 2 TABLETS BY MOUTH 2 TIMES A DAY FOR DIABETES  . methocarbamol (ROBAXIN) 500 MG tablet TAKE 1 TABLET BY MOUTH EVERY 8 HOURS AS NEEDED FOR MUSCLE SPASMS.  Marland Kitchen pramipexole (MIRAPEX) 0.75 MG tablet Take 1 tablet (0.75 mg total) by mouth 3 (three) times daily.  . sitaGLIPtin (JANUVIA) 100 MG tablet TAKE 1 TABLET BY MOUTH ONCE DAILY FOR DIABETES  . valsartan-hydrochlorothiazide (DIOVAN-HCT) 320-25 MG tablet TAKE 1 TABLET BY MOUTH ONCE DAILY  . VENTOLIN HFA 108 (90 Base) MCG/ACT inhaler INHALE 2 PUFFS INTO THE LUNGS EVERY 6 HOURS AS NEEDED FOR WHEEZING OR SHORTNESS OF BREATH.  . vitamin C (ASCORBIC ACID) 500 MG  tablet Take 1 tablet (500 mg total) by mouth 3 (three) times daily.  . Vitamin D, Ergocalciferol, (DRISDOL) 50000 units CAPS capsule Take 50,000 Units by mouth every 7 (seven) days.   No current facility-administered medications on file prior to visit.      Allergies:  Allergies  Allergen Reactions  . Ciprofloxacin Hives  . Ceftriaxone Hives     Medical History:  Past Medical History:  Diagnosis Date  . Adult BMI 50.0-59.9 kg/sq m    52.77  . Anal fissure   . Anxiety   . Diabetes mellitus without complication (HCC)    borderline  . GERD (gastroesophageal reflux disease)    30 years ago, maybe from peptic ulcer  . H/O: gout    STABLE  . Hepatic steatosis 10/01/11  . History of kidney stones   . Hyperlipidemia   . Hypertension   . Kidney tumor    right upper kidney tumor, monitoring  . OSA on CPAP    [facility]  . Parkinson disease ([place]) 01/04/2016  . REM sleep behavior disorder 12/06/2016  . Right  ureteral stone   . Vitamin D deficiency   . Weakness    Family history- Reviewed and unchanged Social history- Reviewed and unchanged   Review of Systems:  Review of Systems  Constitutional: Negative for malaise/fatigue and weight loss.  HENT: Negative for hearing loss and tinnitus.   Eyes: Negative for blurred vision and double vision.  Respiratory: Negative for cough, shortness of breath and wheezing.   Cardiovascular: Negative for chest pain, palpitations, orthopnea, claudication and leg swelling.  Gastrointestinal: Negative for abdominal pain, blood in stool, constipation, diarrhea, heartburn, melena, nausea and vomiting.  Genitourinary: Negative.   Musculoskeletal: Negative for joint pain and myalgias.  Skin: Negative for rash.  Neurological: Negative for dizziness, tingling, sensory change, weakness and headaches.  Endo/Heme/Allergies: Negative for polydipsia.  Psychiatric/Behavioral: Negative.   All other systems reviewed and are negative.    Physical  Exam: BP 124/80   Pulse 80   Temp 98.1 F (36.7 C)   Ht 5\' 7"  (1.702 m)   Wt 290 lb (131.5 kg)   SpO2 97%   BMI 45.42 kg/m  Wt Readings from Last 3 Encounters:  10/22/17 290 lb (131.5 kg)  07/17/17 284 lb (128.8 kg)  06/27/17 279 lb 6.4 oz (126.7 kg)   General Appearance: Well nourished, in no apparent distress. Eyes: PERRLA, EOMs, conjunctiva no swelling or erythema Sinuses: No Frontal/maxillary tenderness ENT/Mouth: Ext aud canals clear, TMs without erythema, bulging. No erythema, swelling, or exudate on post pharynx.  Tonsils not swollen or erythematous. Hearing normal.  Neck: Supple, thyroid normal.  Respiratory: Respiratory effort normal, BS equal bilaterally without rales, rhonchi, wheezing or stridor.  Cardio: RRR with no MRGs. Brisk peripheral pulses without edema.  Abdomen: Soft, + BS.  Non tender, no guarding, rebound, hernias, masses. Lymphatics: Non tender without lymphadenopathy.  Musculoskeletal: Full ROM, 5/5 strength, slow gait Skin: Warm, dry without rashes, lesions, ecchymosis.  Neuro: Cranial nerves intact. No cerebellar symptoms.  Psych: Awake and oriented X 3, normal affect, Insight and Judgment appropriate.    Izora Ribas, NP 8:55 AM [facility]

## 2017-10-22 ENCOUNTER — Ambulatory Visit: Payer: 59 | Admitting: Adult Health

## 2017-10-22 ENCOUNTER — Encounter: Payer: Self-pay | Admitting: Adult Health

## 2017-10-22 VITALS — BP 124/80 | HR 80 | Temp 98.1°F | Ht 67.0 in | Wt 290.0 lb

## 2017-10-22 DIAGNOSIS — Z79899 Other long term (current) drug therapy: Secondary | ICD-10-CM

## 2017-10-22 DIAGNOSIS — Z6841 Body Mass Index (BMI) 40.0 and over, adult: Secondary | ICD-10-CM

## 2017-10-22 DIAGNOSIS — I10 Essential (primary) hypertension: Secondary | ICD-10-CM | POA: Diagnosis not present

## 2017-10-22 DIAGNOSIS — IMO0002 Reserved for concepts with insufficient information to code with codable children: Secondary | ICD-10-CM

## 2017-10-22 DIAGNOSIS — N183 Chronic kidney disease, stage 3 (moderate): Secondary | ICD-10-CM

## 2017-10-22 DIAGNOSIS — E1322 Other specified diabetes mellitus with diabetic chronic kidney disease: Secondary | ICD-10-CM | POA: Diagnosis not present

## 2017-10-22 DIAGNOSIS — E785 Hyperlipidemia, unspecified: Secondary | ICD-10-CM

## 2017-10-22 DIAGNOSIS — E1365 Other specified diabetes mellitus with hyperglycemia: Secondary | ICD-10-CM

## 2017-10-22 DIAGNOSIS — E559 Vitamin D deficiency, unspecified: Secondary | ICD-10-CM

## 2017-10-22 NOTE — Patient Instructions (Addendum)
Weight goal: 280 lb  Pending labs increase rosuvastatin to 20 mg daily      Aim for 7+ servings of fruits and vegetables daily  80+ fluid ounces of water or unsweet tea for healthy kidneys  Limit alcohol intake, avoid smoking  Limit animal fats in diet for cholesterol and heart health - choose grass fed whenever available  Aim for low stress - take time to unwind and care for your mental health  Aim for 150 min of moderate intensity exercise weekly for heart health, and weights twice weekly for bone health  Aim for 7-9 hours of sleep daily      When it comes to diets, agreement about the perfect plan isn't easy to find, even among the experts. Experts at the [employer] developed an idea known as the Healthy Eating Plate. Just imagine a plate divided into logical, healthy portions.  The emphasis is on diet quality:  Load up on vegetables and fruits - one-half of your plate: Aim for color and variety, and remember that potatoes don't count.  Go for whole grains - one-quarter of your plate: Whole wheat, barley, wheat berries, quinoa, oats, brown rice, and foods made with them. If you want pasta, go with whole wheat pasta.  Protein power - one-quarter of your plate: Fish, chicken, beans, and nuts are all healthy, versatile protein sources. Limit red meat.  The diet, however, does go beyond the plate, offering a few other suggestions.  Use healthy plant oils, such as olive, canola, soy, corn, sunflower and peanut. Check the labels, and avoid partially hydrogenated oil, which have unhealthy trans fats.  If you're thirsty, drink water. Coffee and tea are good in moderation, but skip sugary drinks and limit milk and dairy products to one or two daily servings.  The type of carbohydrate in the diet is more important than the amount. Some sources of carbohydrates, such as vegetables, fruits, whole grains, and beans-are healthier than others.  Finally, stay  active.

## 2017-10-23 ENCOUNTER — Other Ambulatory Visit: Payer: Self-pay | Admitting: Adult Health

## 2017-10-23 MED ORDER — ROSUVASTATIN CALCIUM 40 MG PO TABS: 40.0000 mg | ORAL_TABLET | Freq: Every day | ORAL | 1 refills | Status: DC

## 2017-11-06 ENCOUNTER — Ambulatory Visit: Payer: 59 | Admitting: Neurology

## 2017-11-06 ENCOUNTER — Encounter: Payer: Self-pay | Admitting: Neurology

## 2017-11-06 VITALS — BP 148/84 | HR 68 | Ht 67.0 in | Wt 291.0 lb

## 2017-11-06 DIAGNOSIS — G20 Parkinson's disease: Secondary | ICD-10-CM

## 2017-11-06 DIAGNOSIS — G20A1 Parkinson's disease without dyskinesia, without mention of fluctuations: Secondary | ICD-10-CM

## 2017-11-06 MED ORDER — CARBIDOPA-LEVODOPA 25-100 MG PO TABS: 1.5000 | ORAL_TABLET | Freq: Three times a day (TID) | ORAL | 2 refills | Status: DC

## 2017-11-06 NOTE — Progress Notes (Signed)
Reason for visit: Parkinson's disease  Todd Rice is an 62 y.o. male  History of present illness:  Todd Rice is a 62 year old right-handed white male with a history of significant obesity and Parkinson's disease.  The patient has diabetes.  The patient is followed through this office for sleep apnea as well.  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  The patient is on Mirapex taking 0.75 mg 3 times daily, he takes Sinemet 25/100 mg 3 times daily.  The patient is tolerating these drugs well, he is not having excessive drowsiness on the medication.  The patient denies any falls, he denies issues with swallowing.  He does have some occasional left hip discomfort, the right hip pain he had previously has gotten better.  The patient does have facet joint arthritis in the lower back.  He has mild bilateral hip degenerative changes.  Past Medical History:  Diagnosis Date  . Adult BMI 50.0-59.9 kg/sq m    52.77  . Anal fissure   . Anxiety   . Diabetes mellitus without complication (HCC)    borderline  . GERD (gastroesophageal reflux disease)    30 years ago, maybe from peptic ulcer  . H/O: gout    STABLE  . Hepatic steatosis 10/01/11  . History of kidney stones   . Hyperlipidemia   . Hypertension   . Kidney tumor    right upper kidney tumor, monitoring  . OSA on CPAP    [facility]  . Parkinson disease ([place]) 01/04/2016  . REM sleep behavior disorder 12/06/2016  . Right ureteral stone   . Vitamin D deficiency   . Weakness     Past Surgical History:  Procedure Laterality Date  . ANAL FISSURE REPAIR  10-12-2000  . COLONOSCOPY N/A 07/01/2012   Procedure: COLONOSCOPY;  Surgeon: Lafayette Dragon, MD;  Location: [facility];  Service: Endoscopy;  Laterality: N/A;  . CYSTOSCOPY WITH RETROGRADE PYELOGRAM, URETEROSCOPY AND STENT PLACEMENT Bilateral 02/26/2013   Procedure: CYSTOSCOPY WITH RETROGRADE PYELOGRAM,  URETEROSCOPY AND STENT PLACEMENT;  Surgeon: Alexis Frock, MD;  [facility]: [facility];  Service: Urology;  Laterality: Bilateral;  . HOLMIUM LASER APPLICATION Bilateral 89/38/1017   Procedure: HOLMIUM LASER APPLICATION;  Surgeon: Alexis Frock, MD;  Location: [facility];  Service: Urology;  Laterality: Bilateral;  . kidney stone removal     06/2011-stent placement  . LEFT MEDIAL FEMORAL CONDYLE DEBRIDEMENT & DRILLING/ REMOVAL LOOSE BODY  09-15-2003  . NASAL SEPTUM SURGERY  1985  . STONE EXTRACTION WITH BASKET  06/12/2011   Procedure: STONE EXTRACTION WITH BASKET;  Surgeon: Claybon Jabs, MD;  Location: [facility];  Service: Urology;  Laterality: Right;    Family History  Problem Relation Age of Onset  . Heart disease Mother        Atrial fibrillation  . Hypertension Mother   . Hyperlipidemia Mother   . Lung cancer Father        was a smoker  . Cancer Father        lung  . Hyperlipidemia Brother   . Heart disease Maternal Aunt   . Hyperlipidemia Maternal Aunt   . Hypertension Maternal Aunt   . Stroke Maternal Aunt   . Heart disease Paternal Grandmother   . Hyperlipidemia Paternal Grandmother     Social history:  reports that he has never smoked. He has never used smokeless tobacco. He reports that he does  not drink alcohol or use drugs.    Allergies  Allergen Reactions  . Ciprofloxacin Hives  . Ceftriaxone Hives    Medications:  Prior to Admission medications   Medication Sig Start Date End Date Taking? Authorizing Provider  allopurinol (ZYLOPRIM) 300 MG tablet TAKE 1 TABLET BY MOUTH ONCE DAILY 09/05/17  Yes [name], NP  ALPRAZolam Duanne Moron) 0.5 MG tablet TAKE 1 TABLET BY MOUTH THREE TIMES DAILY 05/09/17  Yes Vicie Mutters, PA-C  aspirin EC 81 MG tablet Take 81 mg by mouth at bedtime.   Yes [provider]  carbidopa-levodopa (SINEMET IR) 25-100 MG tablet Take 1.5 tablets by mouth 3 (three) times daily. 11/06/17  Yes Kathrynn Ducking, MD    clotrimazole-betamethasone (LOTRISONE) cream Apply 1 application topically 2 (two) times daily. 01/03/17  Yes Vicie Mutters, PA-C  COLCRYS 0.6 MG tablet Take 1 tablet (0.6 mg total) by mouth 2 (two) times daily. 12/11/14  Yes Forcucci, Courtney, PA-C  EPINEPHrine (EPIPEN 2-PAK) 0.3 mg/0.3 mL IJ SOAJ injection Inject 0.3 mLs (0.3 mg total) into the muscle once. 03/27/14  Yes Kelby Aline, PA-C  meloxicam (MOBIC) 15 MG tablet Take one daily with food for 2 weeks, can take with tylenol, can not take with aleve, iburpofen, then as needed daily for pain 01/16/17  Yes Corbett, Caryl Pina, NP  metFORMIN (GLUCOPHAGE-XR) 500 MG 24 hr tablet TAKE 2 TABLETS BY MOUTH 2 TIMES A DAY FOR DIABETES 06/04/17  Yes [name], NP  methocarbamol (ROBAXIN) 500 MG tablet TAKE 1 TABLET BY MOUTH EVERY 8 HOURS AS NEEDED FOR MUSCLE SPASMS. 05/09/17  Yes Vicie Mutters, PA-C  pramipexole (MIRAPEX) 0.75 MG tablet Take 1 tablet (0.75 mg total) by mouth 3 (three) times daily. 05/14/17  Yes Kathrynn Ducking, MD  rosuvastatin (CRESTOR) 40 MG tablet Take 1 tablet (40 mg total) by mouth daily. 10/23/17  Yes [name], NP  sitaGLIPtin (JANUVIA) 100 MG tablet TAKE 1 TABLET BY MOUTH ONCE DAILY FOR DIABETES 05/21/17  Yes Unk Pinto, MD  valsartan-hydrochlorothiazide (DIOVAN-HCT) 320-25 MG tablet TAKE 1 TABLET BY MOUTH ONCE DAILY 09/05/17  Yes [name], NP  VENTOLIN HFA 108 (90 Base) MCG/ACT inhaler INHALE 2 PUFFS INTO THE LUNGS EVERY 6 HOURS AS NEEDED FOR WHEEZING OR SHORTNESS OF BREATH. 07/17/16  Yes Unk Pinto, MD  vitamin C (ASCORBIC ACID) 500 MG tablet Take 1 tablet (500 mg total) by mouth 3 (three) times daily. 01/16/17  Yes [name], NP  Vitamin D, Ergocalciferol, (DRISDOL) 50000 units CAPS capsule Take 50,000 Units by mouth every 7 (seven) days.   Yes [provider]  albuterol (PROVENTIL HFA;VENTOLIN HFA) 108 (90 BASE) MCG/ACT inhaler Take 1 to 2 inhalations 4 x daily or every 4 hours if need to  rescue asthma 12/15/14 10/22/17  Unk Pinto, MD    ROS:  Out of a complete 14 system review of symptoms, the patient complains only of the following symptoms, and all other reviewed systems are negative.  Constipation joint pain, aching muscles   Blood pressure (!) 148/84, pulse 68, height 5\' 7"  (1.702 m), weight 291 lb (132 kg).  Physical Exam  General: The patient is alert and cooperative at the time of the examination.   The patient is markedly obese.  Skin: No significant peripheral edema is noted.   Neurologic Exam  Mental status: The patient is alert and oriented x 3 at the time of the examination. The patient has apparent normal recent and remote memory, with an apparently normal attention span and  concentration ability.   Cranial nerves: Facial symmetry is present. Speech is normal, no aphasia or dysarthria is noted. Extraocular movements are full. Visual fields are full.  Masking of the face is seen.  Motor: The patient has good strength in all 4 extremities.  Sensory examination: Soft touch sensation is symmetric on the face, arms, and legs.  Coordination: The patient has good finger-nose-finger and heel-to-shin bilaterally.  The patient has clumsiness of both hands, right greater than left with fine motor movements.  Gait and station: The patient has a normal gait. Tandem gait is normal. Romberg is negative. No drift is seen.  Reflexes: Deep tendon reflexes are symmetric.   Assessment/Plan:  1.  Parkinson's disease  2.  Sleep apnea  The patient will be increased on Sinemet taking 1.5 tablets of the 25/100 mg tablets 3 times daily.  The patient will continue on Mirapex taking 0.75 mg 3 times daily.  He will follow-up in 6 months.  A prescription was sent in for the Sinemet.  Jill Alexanders MD 11/06/2017 8:21 AM  [facility] [address] [place], [place] [address]  Phone [phone] Fax [phone]

## 2017-11-06 NOTE — Patient Instructions (Signed)
We will go up on the sinemet 25/100 mg tablets to 1.5 tablets three times a day.  Sinemet (carbidopa) may result in confusion or hallucinations, drowsiness, nausea, or dizziness. If any significant side effects are noted, please contact our office. Sinemet may not be well absorbed when taken with high protein meals, if tolerated it is best to take 30-45 minutes before you eat.

## 2017-11-09 ENCOUNTER — Other Ambulatory Visit: Payer: Self-pay | Admitting: Internal Medicine

## 2017-11-09 MED FILL — JANUVIA 100 MG TABLET: 100 | 60 days supply | Qty: 60 | Fill #0

## 2017-11-12 ENCOUNTER — Ambulatory Visit: Payer: 59 | Admitting: Neurology

## 2017-12-03 MED FILL — VALSARTAN-HCTZ 320-25 MG TA: 320-25 | 90 days supply | Qty: 90 | Fill #1

## 2017-12-03 MED FILL — ALLOPURINOL 300 MG TABS: 300 | 90 days supply | Qty: 90 | Fill #1

## 2017-12-03 MED FILL — METFORMIN HCL ER 500 MG TAB: 500 | 90 days supply | Qty: 360 | Fill #0

## 2017-12-04 DIAGNOSIS — G4733 Obstructive sleep apnea (adult) (pediatric): Secondary | ICD-10-CM | POA: Diagnosis not present

## 2017-12-18 ENCOUNTER — Other Ambulatory Visit: Payer: Self-pay | Admitting: Adult Health

## 2017-12-18 ENCOUNTER — Telehealth: Payer: Self-pay | Admitting: Internal Medicine

## 2017-12-18 MED ORDER — PREDNISONE 20 MG PO TABS: ORAL_TABLET | ORAL | 0 refills | Status: DC

## 2017-12-18 MED FILL — predniSONE 20 MG TABS: 20 | 7 days supply | Qty: 10 | Fill #0

## 2017-12-18 NOTE — Telephone Encounter (Signed)
Patient notified

## 2017-12-18 NOTE — Telephone Encounter (Signed)
Right lower back sharp pain, recurrent. patient requesting predinsone to [facility].

## 2017-12-24 MED FILL — CARBIDOPA-LEVODOPA 25-100 T: 25-100 | 90 days supply | Qty: 270 | Fill #2

## 2017-12-27 ENCOUNTER — Encounter: Payer: Self-pay | Admitting: Adult Health

## 2017-12-27 ENCOUNTER — Ambulatory Visit: Payer: 59 | Admitting: Adult Health

## 2017-12-27 VITALS — BP 132/72 | HR 94 | Ht 67.0 in | Wt 293.0 lb

## 2017-12-27 DIAGNOSIS — Z9989 Dependence on other enabling machines and devices: Secondary | ICD-10-CM | POA: Diagnosis not present

## 2017-12-27 DIAGNOSIS — G4733 Obstructive sleep apnea (adult) (pediatric): Secondary | ICD-10-CM

## 2017-12-27 NOTE — Progress Notes (Signed)
PATIENT: Todd Rice DOB: 20-Jul-1955  REASON FOR VISIT: follow up HISTORY FROM: patient  HISTORY OF PRESENT ILLNESS: Today 12/27/17:  Todd Rice is a 62 year old male with a history of obstructive sleep apnea on CPAP.  His CPAP and indicates that he use the machine 30 out of 30 days for compliance of 100%.  He uses machine greater than 4 hours 29 out of 30 days for compliance of 96%.  On average he is machine 6 hours and 36 minutes.  His residual AHI is 10 on 17/10 centimeters of water with pressure support of 4 cm of water.  His leak in the 95th percentile is 54.4 L/min.  He does state that after the last visit he did go [facility] and they changed his mask although he still has a leak.  He returns today for evaluation.  HISTORY  06/27/17 Todd Rice is a 62 year old male with a history of obstructive sleep apnea on CPAP.  He returns today for follow-up.  His download indicates that he use his machine 30 out of 30 days for compliance of 100%.  He uses machine greater than 4 hours 28 out of 30 days for compliance of 93%.  On average he uses his machine 6 hours and 45 minutes.  His residual AHI is 2.4.  He is on a maximum inspiratory pressure of 17 cm of water and minimum expiratory pressure of 10 cm of water with pressure support of 4 cm of water.  His leak in the 95th percentile is 98 L/min.  He states that often times during the night his mask will ride up into his mouth potentially causing a leak.  He reports that he is tried tightening the straps but continues to rise into his mouth.  He returns today for evaluation.  REVIEW OF SYSTEMS: Out of a complete 14 system review of symptoms, the patient complains only of the following symptoms, and all other reviewed systems are negative.  ESS 7  ALLERGIES: Allergies  Allergen Reactions  . Ciprofloxacin Hives  . Ceftriaxone Hives    HOME MEDICATIONS: Outpatient Medications Prior to Visit  Medication Sig Dispense Refill  .  allopurinol (ZYLOPRIM) 300 MG tablet TAKE 1 TABLET BY MOUTH ONCE DAILY 90 tablet 2  . ALPRAZolam (XANAX) 0.5 MG tablet TAKE 1 TABLET BY MOUTH THREE TIMES DAILY 90 tablet 2  . aspirin EC 81 MG tablet Take 81 mg by mouth at bedtime.    . carbidopa-levodopa (SINEMET IR) 25-100 MG tablet Take 1.5 tablets by mouth 3 (three) times daily. 405 tablet 2  . clotrimazole-betamethasone (LOTRISONE) cream Apply 1 application topically 2 (two) times daily. 15 g 2  . COLCRYS 0.6 MG tablet Take 1 tablet (0.6 mg total) by mouth 2 (two) times daily. 180 tablet 12  . EPINEPHrine (EPIPEN 2-PAK) 0.3 mg/0.3 mL IJ SOAJ injection Inject 0.3 mLs (0.3 mg total) into the muscle once. 2 Device 1  . meloxicam (MOBIC) 15 MG tablet Take one daily with food for 2 weeks, can take with tylenol, can not take with aleve, iburpofen, then as needed daily for pain 30 tablet 1  . metFORMIN (GLUCOPHAGE-XR) 500 MG 24 hr tablet TAKE 2 TABLETS BY MOUTH 2 TIMES A DAY FOR DIABETES 360 tablet 1  . methocarbamol (ROBAXIN) 500 MG tablet TAKE 1 TABLET BY MOUTH EVERY 8 HOURS AS NEEDED FOR MUSCLE SPASMS. 24 tablet 0  . pramipexole (MIRAPEX) 0.75 MG tablet Take 1 tablet (0.75 mg total) by mouth 3 (three) times  daily. 270 tablet 2  . rosuvastatin (CRESTOR) 40 MG tablet Take 1 tablet (40 mg total) by mouth daily. 90 tablet 1  . sitaGLIPtin (JANUVIA) 100 MG tablet TAKE 1 TABLET BY MOUTH ONCE DAILY FOR DIABETES 90 tablet 1  . valsartan-hydrochlorothiazide (DIOVAN-HCT) 320-25 MG tablet TAKE 1 TABLET BY MOUTH ONCE DAILY 90 tablet 1  . VENTOLIN HFA 108 (90 Base) MCG/ACT inhaler INHALE 2 PUFFS INTO THE LUNGS EVERY 6 HOURS AS NEEDED FOR WHEEZING OR SHORTNESS OF BREATH. 54 g 3  . vitamin C (ASCORBIC ACID) 500 MG tablet Take 1 tablet (500 mg total) by mouth 3 (three) times daily. 21 tablet 0  . Vitamin D, Ergocalciferol, (DRISDOL) 50000 units CAPS capsule Take 50,000 Units by mouth every 7 (seven) days.    Marland Kitchen albuterol (PROVENTIL HFA;VENTOLIN HFA) 108 (90 BASE)  MCG/ACT inhaler Take 1 to 2 inhalations 4 x daily or every 4 hours if need to rescue asthma 18 g 99  . predniSONE (DELTASONE) 20 MG tablet 2 tablets daily for 3 days, 1 tablet daily for 4 days. 10 tablet 0   No facility-administered medications prior to visit.     PAST MEDICAL HISTORY: Past Medical History:  Diagnosis Date  . Adult BMI 50.0-59.9 kg/sq m    52.77  . Anal fissure   . Anxiety   . Diabetes mellitus without complication (HCC)    borderline  . GERD (gastroesophageal reflux disease)    30 years ago, maybe from peptic ulcer  . H/O: gout    STABLE  . Hepatic steatosis 10/01/11  . History of kidney stones   . Hyperlipidemia   . Hypertension   . Kidney tumor    right upper kidney tumor, monitoring  . OSA on CPAP    [facility]  . Parkinson disease ([place]) 01/04/2016  . REM sleep behavior disorder 12/06/2016  . Right ureteral stone   . Vitamin D deficiency   . Weakness     PAST SURGICAL HISTORY: Past Surgical History:  Procedure Laterality Date  . ANAL FISSURE REPAIR  10-12-2000  . COLONOSCOPY N/A 07/01/2012   Procedure: COLONOSCOPY;  Surgeon: Lafayette Dragon, MD;  [facility];  Service: Endoscopy;  Laterality: N/A;  . CYSTOSCOPY WITH RETROGRADE PYELOGRAM, URETEROSCOPY AND STENT PLACEMENT Bilateral 02/26/2013   Procedure: CYSTOSCOPY WITH RETROGRADE PYELOGRAM, URETEROSCOPY AND STENT PLACEMENT;  Surgeon: Alexis Frock, MD;  [facility];  Service: Urology;  Laterality: Bilateral;  . HOLMIUM LASER APPLICATION Bilateral 93/90/3009   Procedure: HOLMIUM LASER APPLICATION;  Surgeon: Alexis Frock, MD;  [facility];  Service: Urology;  Laterality: Bilateral;  . kidney stone removal     06/2011-stent placement  . LEFT MEDIAL FEMORAL CONDYLE DEBRIDEMENT & DRILLING/ REMOVAL LOOSE BODY  09-15-2003  . NASAL SEPTUM SURGERY  1985  . STONE EXTRACTION WITH BASKET  06/12/2011   Procedure: STONE EXTRACTION WITH BASKET;  Surgeon: Claybon Jabs, MD;  [facility];  Service: Urology;  Laterality: Right;    FAMILY HISTORY: Family History  Problem Relation Age of Onset  . Heart disease Mother        Atrial fibrillation  . Hypertension Mother   . Hyperlipidemia Mother   . Lung cancer Father        was a smoker  . Cancer Father        lung  . Hyperlipidemia Brother   . Heart disease Maternal Aunt   . Hyperlipidemia Maternal Aunt   . Hypertension Maternal Aunt   .  Stroke Maternal Aunt   . Heart disease Paternal Grandmother   . Hyperlipidemia Paternal Grandmother     SOCIAL HISTORY: Social History   Socioeconomic History  . Marital status: Married    Spouse name: Not on file  . Number of children: 0  . Years of education: Not on file  . Highest education level: Not on file  Occupational History  . Not on file  Social Needs  . Financial resource strain: Not on file  . Food insecurity:    Worry: Not on file    Inability: Not on file  . Transportation needs:    Medical: Not on file    Non-medical: Not on file  Tobacco Use  . Smoking status: Never Smoker  . Smokeless tobacco: Never Used  Substance and Sexual Activity  . Alcohol use: No  . Drug use: No    Comment: QUIT SMOKING " POT" 40 YRS AGO  . Sexual activity: Not on file  Lifestyle  . Physical activity:    Days per week: Not on file    Minutes per session: Not on file  . Stress: Not on file  Relationships  . Social connections:    Talks on phone: Not on file    Gets together: Not on file    Attends religious service: Not on file    Active member of club or organization: Not on file    Attends meetings of clubs or organizations: Not on file    Relationship status: Not on file  . Intimate partner violence:    Fear of current or ex partner: Not on file    Emotionally abused: Not on file    Physically abused: Not on file    Forced sexual activity: Not on file  Other Topics Concern  . Not on file  Social History Narrative   Right-handed.   No caffeine  use.   Lives at home with his wife.      PHYSICAL EXAM  Vitals:   12/27/17 0812  BP: 132/72  Pulse: 94  Weight: 293 lb (132.9 kg)  Height: 5\' 7"  (1.702 m)   Body mass index is 45.89 kg/m.  Generalized: Well developed, in no acute distress   Neurological examination  Mentation: Alert oriented to time, place, history taking. Follows all commands speech and language fluent Cranial nerve II-XII: Pupils were equal round reactive to light. Extraocular movements were full, visual field were full on confrontational test. Facial sensation and strength were normal. Uvula tongue midline. Head turning and shoulder shrug  were normal and symmetric. Neck circumference 22-1/2 inches, Mallampati 4 Motor: The motor testing reveals 5 over 5 strength of all 4 extremities. Good symmetric motor tone is noted throughout.  Sensory: Sensory testing is intact to soft touch on all 4 extremities. No evidence of extinction is noted.  Coordination: Cerebellar testing reveals good finger-nose-finger and heel-to-shin bilaterally.  Gait and station: Gait is normal.    DIAGNOSTIC DATA (LABS, IMAGING, TESTING) - I reviewed patient records, labs, notes, testing and imaging myself where available.  Lab Results  Component Value Date   WBC 8.1 10/22/2017   HGB 15.3 10/22/2017   HCT 44.9 10/22/2017   MCV 80.9 10/22/2017   PLT 192 10/22/2017      Component Value Date/Time   NA 137 10/22/2017 0910   K 4.4 10/22/2017 0910   CL 99 10/22/2017 0910   CO2 26 10/22/2017 0910   GLUCOSE 217 (H) 10/22/2017 0910   BUN 19 10/22/2017 0910  CREATININE 1.21 10/22/2017 0910   CALCIUM 10.2 10/22/2017 0910   PROT 7.4 10/22/2017 0910   ALBUMIN 3.8 10/24/2016 1601   AST 12 10/22/2017 0910   ALT 12 10/22/2017 0910   ALKPHOS 58 10/24/2016 1601   BILITOT 0.4 10/22/2017 0910   GFRNONAA 63 10/22/2017 0910   GFRAA 73 10/22/2017 0910   Lab Results  Component Value Date   CHOL 179 10/22/2017   HDL 32 (L) 10/22/2017    LDLCALC 103 (H) 10/22/2017   TRIG 328 (H) 10/22/2017   CHOLHDL 5.6 (H) 10/22/2017   Lab Results  Component Value Date   HGBA1C 6.6 (H) 10/22/2017   Lab Results  Component Value Date   VITAMINB12 570 01/03/2017   Lab Results  Component Value Date   TSH 4.02 10/22/2017      ASSESSMENT AND PLAN 62 y.o. year old male  has a past medical history of Adult BMI 50.0-59.9 kg/sq m, Anal fissure, Anxiety, Diabetes mellitus without complication ([place]), GERD (gastroesophageal reflux disease), H/O: gout, Hepatic steatosis (10/01/11), History of kidney stones, Hyperlipidemia, Hypertension, Kidney tumor, OSA on CPAP, Parkinson disease ([place]) (01/04/2016), REM sleep behavior disorder (12/06/2016), Right ureteral stone, Vitamin D deficiency, and Weakness. here with:  1. OSA on CPAP  Patient CPAP download shows excellent compliance however his AHI is slightly elevated and he does have a significant leak.  He will stop by our sleep lab today to have his mask refitted.  He is encouraged to continue using his CPAP nightly and greater than 4 hours each night.  He is advised that if his symptoms worsen or he develops new symptoms he should let us know.  He will follow-up in 6 months or sooner if needed.   I spent 15 minutes with the patient. 50% of this time was spent reviewing his CPAP download   Ward Givens, MSN, NP-C 12/27/2017, 8:27 AM [facility] [address], [place], [place] [address] [phone]

## 2017-12-27 NOTE — Patient Instructions (Signed)
Your Plan:  Continue using CPAP nightly Mask refitting If your symptoms worsen or you develop new symptoms please let us know.   Thank you for coming to see [place] at [facility]. I hope we have been able to provide you high quality care today.  You may receive a patient satisfaction survey over the next few weeks. We would appreciate your feedback and comments so that we may continue to improve ourselves and the health of our patients.

## 2018-01-08 ENCOUNTER — Encounter: Payer: Self-pay | Admitting: Physician Assistant

## 2018-01-08 MED FILL — JANUVIA 100 MG TABLET: 100 | 60 days supply | Qty: 60 | Fill #1

## 2018-01-17 ENCOUNTER — Ambulatory Visit: Payer: 59

## 2018-01-17 ENCOUNTER — Ambulatory Visit: Payer: 59 | Admitting: Physical Therapy

## 2018-01-17 ENCOUNTER — Ambulatory Visit: Payer: 59 | Admitting: Occupational Therapy

## 2018-01-17 NOTE — Progress Notes (Signed)
Complete Physical  Assessment and Plan:  Encounter for general adult medical examination with abnormal findings 1 year  Coronary artery disease involving native coronary artery of native heart without angina pectoris Control blood pressure, cholesterol, glucose, increase exercise.   Essential hypertension - continue medications, DASH diet, exercise and monitor at home. Call if greater than 130/80.  -     CBC with Differential/Platelet -     BASIC METABOLIC PANEL WITH GFR -     Hepatic function panel -     TSH -     Urinalysis, Routine w reflex microscopic -     Microalbumin / creatinine urine ratio -     EKG 12-Lead  Type 2 diabetes mellitus without complication, without long-term current use of insulin ([place]) Discussed general issues about diabetes pathophysiology and management., Educational material distributed., Suggested low cholesterol diet., Encouraged aerobic exercise., Discussed foot care., Reminded to get yearly retinal exam. -     Hemoglobin A1c  Parkinson disease ([place]) Continue neuro follow up and meds  Hyperlipidemia, unspecified hyperlipidemia type -continue medications, check lipids, decrease fatty foods, increase activity.  -     Lipid panel  Class 3 obesity due to excess calories with serious comorbidity and body mass index (BMI) of 45.0 to 49.9 in adult [facility]) - long discussion about weight loss, diet, and exercise -     Lipid panel -     Hemoglobin A1c  Medication management -     Magnesium  Vitamin D deficiency -     VITAMIN D 25 Hydroxy (Vit-D Deficiency, Fractures)  Seasonal and perennial allergic rhinitis - Allegra OTC, increase H20, allergy hygiene explained.  Asthma, mild intermittent, well-controlled Weight loss   Pseudomonas aeruginosa colonization Monitor  REM sleep behavior disorder continue neuro follow up  Screening for prostate cancer -     PSA  OSA treated with BiPAP Sleep apnea- continue CPAP, CPAP is helping with daytime  fatigue, weight loss still advised.   Uncontrolled secondary diabetes mellitus with stage 3 CKD (GFR 30-59) (H[facility]) Discussed general issues about diabetes pathophysiology and management., Educational material distributed., Suggested low cholesterol diet., Encouraged aerobic exercise., Discussed foot care., Reminded to get yearly retinal exam.  Controlled type 2 diabetes mellitus with diabetic dermatitis, without long-term current use of insulin ([place]) Discussed general issues about diabetes pathophysiology and management., Educational material distributed., Suggested low cholesterol diet., Encouraged aerobic exercise., Discussed foot care., Reminded to get yearly retinal exam.  Depression Dicussed counseling, increase exercise, discussed possible celexa versus wellbutrin start but wants to wait, will message, has follow up in Dec.   Discussed med's effects and SE's. Screening labs and tests as requested with regular follow-up as recommended.  [name]   He admits to some depression/anxiety last 3 months. He has had less motivation to get off the couch, not wanting to do anything, some racing thoughts. Declines meds at this time.   His blood pressure has been controlled at home, today their BP is BP: 122/80 He does workout. He denies chest pain, shortness of breath, dizziness.   Patient reports that he has been following with neurology for parkinson. He reports that he has been taking the mirapex and sinemet.   He is following with nephrology for possible RCC, it shrunk on its own so they will monitor it next year.  He is on cholesterol medication and denies myalgias. His cholesterol is not at goal of 70 or less. The cholesterol last visit was:  Lab Results  Component Value Date   CHOL 179 10/22/2017   HDL 32 (L) 10/22/2017   LDLCALC 103 (H) 10/22/2017   TRIG 328 (H) 10/22/2017   CHOLHDL 5.6 (H) 10/22/2017   He has been working on diet and exercise  for diabetes, he is on metformin 4 a day, januvia at lunch, stopped taking glipizide due to blurry vision. He admits to poor eating.   His sugars have been running 170's, he is on bASA, he is on ACE/ARB, he was on prednisone several weeks ago and denies foot ulcerations, hyperglycemia, hypoglycemia , increased appetite, nausea, paresthesia of the feet, polydipsia, polyuria, visual disturbances, vomiting and weight loss. Last A1C in the office was:  Lab Results  Component Value Date   HGBA1C 6.6 (H) 10/22/2017   Lab Results  Component Value Date   GFRNONAA 63 10/22/2017    Patient is on Vitamin D supplement.   Lab Results  Component Value Date   VD25OH 31 10/22/2017   Last PSA was: Lab Results  Component Value Date   PSA 2.2 01/03/2017   BMI is Body mass index is 45.95 kg/m., he is working on diet and exercise. Has OSA and is on Bipap.  Wt Readings from Last 3 Encounters:  01/21/18 293 lb 6.4 oz (133.1 kg)  12/27/17 293 lb (132.9 kg)  11/06/17 291 lb (132 kg)   Current Medications:  Current Outpatient Medications on File Prior to Visit  Medication Sig Dispense Refill  . allopurinol (ZYLOPRIM) 300 MG tablet TAKE 1 TABLET BY MOUTH ONCE DAILY 90 tablet 2  . ALPRAZolam (XANAX) 0.5 MG tablet TAKE 1 TABLET BY MOUTH THREE TIMES DAILY 90 tablet 2  . aspirin EC 81 MG tablet Take 81 mg by mouth at bedtime.    . carbidopa-levodopa (SINEMET IR) 25-100 MG tablet Take 1.5 tablets by mouth 3 (three) times daily. 405 tablet 2  . clotrimazole-betamethasone (LOTRISONE) cream Apply 1 application topically 2 (two) times daily. 15 g 2  . COLCRYS 0.6 MG tablet Take 1 tablet (0.6 mg total) by mouth 2 (two) times daily. 180 tablet 12  . EPINEPHrine (EPIPEN 2-PAK) 0.3 mg/0.3 mL IJ SOAJ injection Inject 0.3 mLs (0.3 mg total) into the muscle once. 2 Device 1  . meloxicam (MOBIC) 15 MG tablet Take one daily with food for 2 weeks, can take with tylenol, can not take with aleve, iburpofen, then as needed  daily for pain 30 tablet 1  . metFORMIN (GLUCOPHAGE-XR) 500 MG 24 hr tablet TAKE 2 TABLETS BY MOUTH 2 TIMES A DAY FOR DIABETES 360 tablet 1  . methocarbamol (ROBAXIN) 500 MG tablet TAKE 1 TABLET BY MOUTH EVERY 8 HOURS AS NEEDED FOR MUSCLE SPASMS. 24 tablet 0  . pramipexole (MIRAPEX) 0.75 MG tablet Take 1 tablet (0.75 mg total) by mouth 3 (three) times daily. 270 tablet 2  . rosuvastatin (CRESTOR) 40 MG tablet Take 1 tablet (40 mg total) by mouth daily. 90 tablet 1  . sitaGLIPtin (JANUVIA) 100 MG tablet TAKE 1 TABLET BY MOUTH ONCE DAILY FOR DIABETES 90 tablet 1  . valsartan-hydrochlorothiazide (DIOVAN-HCT) 320-25 MG tablet TAKE 1 TABLET BY MOUTH ONCE DAILY 90 tablet 1  . VENTOLIN HFA 108 (90 Base) MCG/ACT inhaler INHALE 2 PUFFS INTO THE LUNGS EVERY 6 HOURS AS NEEDED FOR WHEEZING OR SHORTNESS OF BREATH. 54 g 3  . vitamin C (ASCORBIC ACID) 500 MG tablet Take 1 tablet (500 mg total) by mouth 3 (three) times daily. 21 tablet 0  . Vitamin D,  Ergocalciferol, (DRISDOL) 50000 units CAPS capsule Take 50,000 Units by mouth every 7 (seven) days.    Marland Kitchen albuterol (PROVENTIL HFA;VENTOLIN HFA) 108 (90 BASE) MCG/ACT inhaler Take 1 to 2 inhalations 4 x daily or every 4 hours if need to rescue asthma 18 g 99   No current facility-administered medications on file prior to visit.     Health Maintenance:  Immunization History  Administered Date(s) Administered  . Influenza Inj Mdck Quad With Preservative 02/22/2017  . Influenza-Unspecified 02/03/2013, 02/05/2014  . Pneumococcal-Unspecified 11/05/2012  . Tdap 03/26/2015   Tetanus: 2016 Pneumovax: 2014 Flu vaccine: 2016  Colonoscopy: 2014 Eye Exam: Dr. Clydene Laming April 2018 Sleep study 2017 CTA 2014  [name], [name] Keene Breath., [name], [name], MD (Inactive) as Consulting Physician (Gastroenterology) [name], PA-C  as Physician Assistant (Emergency Medicine)   Medical History:  Past Medical History:  Diagnosis Date  . Adult BMI 50.0-59.9 kg/sq m    52.77  . Anal fissure   . Anxiety   . Diabetes mellitus without complication (HCC)    borderline  . GERD (gastroesophageal reflux disease)    30 years ago, maybe from peptic ulcer  . H/O: gout    STABLE  . Hepatic steatosis 10/01/11  . History of kidney stones   . Hyperlipidemia   . Hypertension   . Kidney tumor    right upper kidney tumor, monitoring  . OSA on CPAP    [facility]  . Parkinson disease ([place]) 01/04/2016  . REM sleep behavior disorder 12/06/2016  . Right ureteral stone   . Ureteral calculus, right 06/12/2011  . Vitamin D deficiency   . Weakness    Allergies Allergies  Allergen Reactions  . Ciprofloxacin Hives  . Ceftriaxone Hives    SURGICAL HISTORY He  has a past surgical history that includes Anal fissure repair (10-12-2000); Nasal septum surgery (1985); LEFT MEDIAL FEMORAL CONDYLE DEBRIDEMENT & DRILLING/ REMOVAL LOOSE BODY (09-15-2003); Stone extraction with basket (06/12/2011); kidney stone removal; Colonoscopy (N/A, 07/01/2012); Cystoscopy with retrograde pyelogram, ureteroscopy and stent placement (Bilateral, 02/26/2013); and Holmium laser application (Bilateral, 02/26/2013). FAMILY HISTORY His family history includes Cancer in his father; Heart disease in his maternal aunt, mother, and paternal grandmother; Hyperlipidemia in his brother, maternal aunt, mother, and paternal grandmother; Hypertension in his maternal aunt and mother; Lung cancer in his father; Stroke in his maternal aunt. SOCIAL HISTORY He  reports that he has never smoked. He has never used smokeless tobacco. He reports that he does not drink alcohol or use drugs.  Review of Systems:  Review of Systems  Constitutional: Positive for malaise/fatigue. Negative for chills, diaphoresis, fever and weight loss.  HENT: Negative for congestion, ear pain, hearing loss  and sore throat.   Eyes: Negative.   Respiratory: Negative for cough, shortness of breath and wheezing.   Cardiovascular: Negative for chest pain, palpitations, orthopnea, claudication, leg swelling and PND.  Gastrointestinal: Negative for blood in stool, constipation, diarrhea, heartburn and melena.  Genitourinary: Negative.   Skin: Negative.   Neurological: Positive for sensory change and focal weakness. Negative for dizziness, loss of consciousness and headaches.  Psychiatric/Behavioral: Negative for depression and memory loss. The patient is not nervous/anxious and does not have insomnia.     Physical Exam: Estimated body mass index is 45.95 kg/m as calculated from the following:   Height as of 12/27/17: 5\' 7"  (1.702 m).  Weight as of this encounter: 293 lb 6.4 oz (133.1 kg). BP 122/80   Pulse (!) 102   Temp 98.5 F (36.9 C)   Resp 18   Wt 293 lb 6.4 oz (133.1 kg)   SpO2 95%   BMI 45.95 kg/m   General Appearance: Well nourished, in no apparent distress.  Eyes: PERRLA, EOMs, conjunctiva no swelling or erythema ENT/Mouth: Ear canals clear bilaterally with no erythema, swelling, discharge.  TMs normal bilaterally with no erythema, bulging, or retractions.  Oropharynx clear and moist with no exudate, swelling, or erythema.  Dentition normal.   Neck: Supple, thyroid normal. No bruits, JVD, cervical adenopathy Respiratory: Respiratory effort normal, BS equal bilaterally without rales, rhonchi, wheezing or stridor.  Cardio: RRR without murmurs, rubs or gallops. Brisk peripheral pulses without edema.  Chest: symmetric, with normal excursions Abdomen: Soft, nontender, no guarding, rebound, hernias, masses, or organomegaly. Genitourinary:  Musculoskeletal: Full ROM all peripheral extremities,5/5 strength, and normal gait.  Skin: Warm, dry without rashes, lesions, ecchymosis. Neuro: A&Ox3, Cranial nerves intact, slow gait.  Overshoot of the right hand with finger to nose.  Tremor of the  left hand with finger opposition>tremor of the right hand with finger opposition.    Psych: Flat affect, Insight and Judgment appropriate.   EKG: WNL no changes.  Over 40 minutes of exam, counseling, chart review and critical decision making was performed  Vicie Mutters 9:08 AM [facility]

## 2018-01-21 ENCOUNTER — Encounter: Payer: Self-pay | Admitting: Physician Assistant

## 2018-01-21 ENCOUNTER — Ambulatory Visit: Payer: 59 | Admitting: Physician Assistant

## 2018-01-21 VITALS — BP 122/80 | HR 102 | Temp 98.5°F | Resp 18 | Ht 67.0 in | Wt 293.4 lb

## 2018-01-21 DIAGNOSIS — E1322 Other specified diabetes mellitus with diabetic chronic kidney disease: Secondary | ICD-10-CM

## 2018-01-21 DIAGNOSIS — N1831 Chronic kidney disease, stage 3a: Secondary | ICD-10-CM | POA: Insufficient documentation

## 2018-01-21 DIAGNOSIS — E559 Vitamin D deficiency, unspecified: Secondary | ICD-10-CM | POA: Diagnosis not present

## 2018-01-21 DIAGNOSIS — J4520 Mild intermittent asthma, uncomplicated: Secondary | ICD-10-CM

## 2018-01-21 DIAGNOSIS — G4733 Obstructive sleep apnea (adult) (pediatric): Secondary | ICD-10-CM

## 2018-01-21 DIAGNOSIS — Z0000 Encounter for general adult medical examination without abnormal findings: Secondary | ICD-10-CM

## 2018-01-21 DIAGNOSIS — Z2239 Carrier of other specified bacterial diseases: Secondary | ICD-10-CM

## 2018-01-21 DIAGNOSIS — N183 Chronic kidney disease, stage 3 (moderate): Secondary | ICD-10-CM

## 2018-01-21 DIAGNOSIS — I2510 Atherosclerotic heart disease of native coronary artery without angina pectoris: Secondary | ICD-10-CM

## 2018-01-21 DIAGNOSIS — Z23 Encounter for immunization: Secondary | ICD-10-CM | POA: Diagnosis not present

## 2018-01-21 DIAGNOSIS — Z125 Encounter for screening for malignant neoplasm of prostate: Secondary | ICD-10-CM | POA: Diagnosis not present

## 2018-01-21 DIAGNOSIS — E785 Hyperlipidemia, unspecified: Secondary | ICD-10-CM

## 2018-01-21 DIAGNOSIS — Z79899 Other long term (current) drug therapy: Secondary | ICD-10-CM | POA: Diagnosis not present

## 2018-01-21 DIAGNOSIS — G4752 REM sleep behavior disorder: Secondary | ICD-10-CM

## 2018-01-21 DIAGNOSIS — F3341 Major depressive disorder, recurrent, in partial remission: Secondary | ICD-10-CM

## 2018-01-21 DIAGNOSIS — Z136 Encounter for screening for cardiovascular disorders: Secondary | ICD-10-CM | POA: Diagnosis not present

## 2018-01-21 DIAGNOSIS — E1365 Other specified diabetes mellitus with hyperglycemia: Secondary | ICD-10-CM

## 2018-01-21 DIAGNOSIS — G20 Parkinson's disease: Secondary | ICD-10-CM

## 2018-01-21 DIAGNOSIS — Z6841 Body Mass Index (BMI) 40.0 and over, adult: Secondary | ICD-10-CM

## 2018-01-21 DIAGNOSIS — E11620 Type 2 diabetes mellitus with diabetic dermatitis: Secondary | ICD-10-CM

## 2018-01-21 DIAGNOSIS — Z0001 Encounter for general adult medical examination with abnormal findings: Secondary | ICD-10-CM

## 2018-01-21 DIAGNOSIS — I10 Essential (primary) hypertension: Secondary | ICD-10-CM

## 2018-01-21 DIAGNOSIS — IMO0002 Reserved for concepts with insufficient information to code with codable children: Secondary | ICD-10-CM

## 2018-01-21 DIAGNOSIS — E1122 Type 2 diabetes mellitus with diabetic chronic kidney disease: Secondary | ICD-10-CM | POA: Insufficient documentation

## 2018-01-21 MED ORDER — ROSUVASTATIN CALCIUM 40 MG PO TABS: 40.0000 mg | ORAL_TABLET | Freq: Every day | ORAL | 1 refills | Status: DC

## 2018-01-21 MED ORDER — ALPRAZOLAM 0.5 MG PO TABS: 0.5000 mg | ORAL_TABLET | Freq: Three times a day (TID) | ORAL | 2 refills | Status: DC

## 2018-01-21 MED ORDER — VALSARTAN-HYDROCHLOROTHIAZIDE 320-25 MG PO TABS: 1.0000 | ORAL_TABLET | Freq: Every day | ORAL | 1 refills | Status: DC

## 2018-01-21 MED FILL — ROSUVASTATIN CALCIUM 40 MG: 40 | 90 days supply | Qty: 90 | Fill #0

## 2018-01-21 MED FILL — ALPRAZolam 0.5 MG TABS: 0.5 | 30 days supply | Qty: 90 | Fill #0

## 2018-01-21 NOTE — Patient Instructions (Addendum)
Check out  Mini habits for weight loss book  2 apps for tracking food is myfitness pal  loseit OR can take picture of your food     When it comes to diets, agreement about the perfect plan isn't easy to find, even among the experts. Experts at the [employer] developed an idea known as the Healthy Eating Plate. Just imagine a plate divided into logical, healthy portions.  The emphasis is on diet quality:  Load up on vegetables and fruits - one-half of your plate: Aim for color and variety, and remember that potatoes don't count.  Go for whole grains - one-quarter of your plate: Whole wheat, barley, wheat berries, quinoa, oats, brown rice, and foods made with them. If you want pasta, go with whole wheat pasta.  Protein power - one-quarter of your plate: Fish, chicken, beans, and nuts are all healthy, versatile protein sources. Limit red meat.  The diet, however, does go beyond the plate, offering a few other suggestions.  Use healthy plant oils, such as olive, canola, soy, corn, sunflower and peanut. Check the labels, and avoid partially hydrogenated oil, which have unhealthy trans fats.  If you're thirsty, drink water. Coffee and tea are good in moderation, but skip sugary drinks and limit milk and dairy products to one or two daily servings.  The type of carbohydrate in the diet is more important than the amount. Some sources of carbohydrates, such as vegetables, fruits, whole grains, and beans-are healthier than others.  Finally, stay active.   Diabetes or even increased sugars put you at 300% increased risk of heart attack and stroke.  ALSO BEING DIABETIC YOU MAY NOT HAVE ANY PAIN WITH A HEART ATTACK.  Even worse of a chance of no pain if you are a woman.  It is very unlikely that you will have any pain with a heart attack. Likely your symptoms will be very subtle, even for very severe disease.  Your symptoms for a heart attack will likely occur when you  exert your self or exercise and include: Shortness of breath Sweating Nausea Dizziness Fast or irregular heart beats Fatigue   It makes me feel better if my diabetics get their heart rate up with exercise once or twice a week and pay close attention to your body. If there is ANY change in your exercise capacity or if you have symptoms above, please STOP and call 911 or call to come to the office.   PLEASE REMEMBER:  Diabetes is preventable! Up to 26 percent of complications and morbidities among individuals with type 2 diabetes can be prevented, delayed, or effectively treated and minimized with regular visits to a health professional, appropriate monitoring and medication, and a healthy diet and lifestyle.   Here is some information to help you keep your heart healthy: Move it! - Aim for 30 mins of activity every day. Take it slowly at first. Talk to [place] before starting any new exercise program.   Lose it.  -Body Mass Index (BMI) can indicate if you need to lose weight. A healthy range is 18.5-24.9. For a BMI calculator, go to [employer]  Waist Management -Excess abdominal fat is a risk factor for heart disease, diabetes, asthma, stroke and more. Ideal waist circumference is less than 35" for women and less than 40" for men.   Eat Right -focus on fruits, vegetables, whole grains, and meals you make yourself. Avoid foods with trans fat and high sugar/sodium content.   Snooze or  Snore? - Loud snoring can be a sign of sleep apnea, a significant risk factor for high blood pressure, heart attach, stroke, and heart arrhythmias.  Kick the habit -Quit Smoking! Avoid second hand smoke. A single cigarette raises your blood pressure for 20 mins and increases the risk of heart attack and stroke for the next 24 hours.   Are Aspirin and Supplements right for you? -Add ENTERIC COATED low dose 81 mg Aspirin daily OR can do every other day if you have easy bruising to protect your heart and  head. As well as to reduce risk of Colon Cancer by 20 %, Skin Cancer by 26 % , Melanoma by 46% and Pancreatic cancer by 60%  Say "No to Stress -There may be little you can do about problems that cause stress. However, techniques such as long walks, meditation, and exercise can help you manage it.   Start Now! - Make changes one at a time and set reasonable goals to increase your likelihood of success.     Vitamin D goal is between 60-80  Please make sure that you are taking your Vitamin D as directed.   It is very important as a natural anti-inflammatory   helping hair, skin, and nails, as well as reducing stroke and heart attack risk.   It helps your bones and helps with mood.  We want you on at least 5000 IU daily  It also decreases numerous cancer risks so please take it as directed.   Low Vit D is associated with a 200-300% higher risk for CANCER   and 200-300% higher risk for HEART   ATTACK  &  STROKE.    .....................................Marland Kitchen  It is also associated with higher death rate at younger ages,   autoimmune diseases like Rheumatoid arthritis, Lupus, Multiple Sclerosis.     Also many other serious conditions, like depression, Alzheimer's  Dementia, infertility, muscle aches, fatigue, fibromyalgia - just to name a few.  +++++++++++++++++++  Can get liquid vitamin D from [place] here in [place] at  [facility] alternatives [address] [place], [place] [address] Or you can try earth fare

## 2018-01-22 LAB — URINALYSIS, ROUTINE W REFLEX MICROSCOPIC
Bilirubin Urine: NEGATIVE
Glucose, UA: NEGATIVE
Hgb urine dipstick: NEGATIVE
Hyaline Cast: NONE SEEN /LPF
KETONES UR: NEGATIVE
NITRITE: POSITIVE — AB
Protein, ur: NEGATIVE
RBC / HPF: NONE SEEN /HPF (ref 0–2)
SPECIFIC GRAVITY, URINE: 1.012 (ref 1.001–1.03)
SQUAMOUS EPITHELIAL / LPF: NONE SEEN /HPF (ref <=5)
pH: 6.5 (ref 5.0–8.0)

## 2018-01-22 LAB — COMPLETE METABOLIC PANEL WITH GFR
AG Ratio: 1.3 (calc) (ref 1.0–2.5)
ALT: 14 U/L (ref 9–46)
AST: 19 U/L (ref 10–35)
Albumin: 4.5 g/dL (ref 3.6–5.1)
Alkaline phosphatase (APISO): 55 U/L (ref 40–115)
BUN/Creatinine Ratio: 16 (calc) (ref 6–22)
BUN: 20 mg/dL (ref 7–25)
CALCIUM: 10.5 mg/dL — AB (ref 8.6–10.3)
CO2: 27 mmol/L (ref 20–32)
CREATININE: 1.26 mg/dL — AB (ref 0.70–1.25)
Chloride: 97 mmol/L — ABNORMAL LOW (ref 98–110)
GFR, EST NON AFRICAN AMERICAN: 60 mL/min/{1.73_m2} (ref >=60)
GFR, Est African American: 70 mL/min/{1.73_m2} (ref >=60)
Globulin: 3.4 g/dL (calc) (ref 1.9–3.7)
Glucose, Bld: 161 mg/dL — ABNORMAL HIGH (ref 65–99)
POTASSIUM: 4.2 mmol/L (ref 3.5–5.3)
Sodium: 137 mmol/L (ref 135–146)
Total Bilirubin: 0.4 mg/dL (ref 0.2–1.2)
Total Protein: 7.9 g/dL (ref 6.1–8.1)

## 2018-01-22 LAB — CBC WITH DIFFERENTIAL/PLATELET
BASOS ABS: 31 {cells}/uL (ref 0–200)
Basophils Relative: 0.3 %
EOS ABS: 71 {cells}/uL (ref 15–500)
EOS PCT: 0.7 %
HCT: 45.9 % (ref 38.5–50.0)
Hemoglobin: 15.5 g/dL (ref 13.2–17.1)
Lymphs Abs: 1958 cells/uL (ref 850–3900)
MCH: 27.1 pg (ref 27.0–33.0)
MCHC: 33.8 g/dL (ref 32.0–36.0)
MCV: 80.4 fL (ref 80.0–100.0)
MPV: 10.7 fL (ref 7.5–12.5)
Monocytes Relative: 7 %
NEUTROS ABS: 7426 {cells}/uL (ref 1500–7800)
Neutrophils Relative %: 72.8 %
PLATELETS: 208 10*3/uL (ref 140–400)
RBC: 5.71 10*6/uL (ref 4.20–5.80)
RDW: 14.8 % (ref 11.0–15.0)
TOTAL LYMPHOCYTE: 19.2 %
WBC mixed population: 714 cells/uL (ref 200–950)
WBC: 10.2 10*3/uL (ref 3.8–10.8)

## 2018-01-22 LAB — HEMOGLOBIN A1C
Hgb A1c MFr Bld: 7.5 % of total Hgb — ABNORMAL HIGH (ref <5.7)
Mean Plasma Glucose: 169 (calc)
eAG (mmol/L): 9.3 (calc)

## 2018-01-22 LAB — PSA: PSA: 3.7 ng/mL (ref <=4.0)

## 2018-01-22 LAB — LIPID PANEL
CHOL/HDL RATIO: 5 (calc) — AB (ref <5.0)
CHOLESTEROL: 186 mg/dL (ref <200)
HDL: 37 mg/dL — AB (ref >40)
LDL CHOLESTEROL (CALC): 109 mg/dL — AB
Non-HDL Cholesterol (Calc): 149 mg/dL (calc) — ABNORMAL HIGH (ref <130)
Triglycerides: 303 mg/dL — ABNORMAL HIGH (ref <150)

## 2018-01-22 LAB — MICROALBUMIN / CREATININE URINE RATIO
CREATININE, URINE: 63 mg/dL (ref 20–320)
Microalb Creat Ratio: 8 mcg/mg creat (ref <30)
Microalb, Ur: 0.5 mg/dL

## 2018-01-22 LAB — MAGNESIUM: MAGNESIUM: 1.7 mg/dL (ref 1.5–2.5)

## 2018-01-22 LAB — TSH: TSH: 3.73 mIU/L (ref 0.40–4.50)

## 2018-01-22 LAB — VITAMIN D 25 HYDROXY (VIT D DEFICIENCY, FRACTURES): Vit D, 25-Hydroxy: 28 ng/mL — ABNORMAL LOW (ref 30–100)

## 2018-02-05 LAB — CBC WITH DIFFERENTIAL/PLATELET
BASOS PCT: 0.5 %
Basophils Absolute: 41 cells/uL (ref 0–200)
EOS ABS: 154 {cells}/uL (ref 15–500)
Eosinophils Relative: 1.9 %
HEMATOCRIT: 44.9 % (ref 38.5–50.0)
HEMOGLOBIN: 15.3 g/dL (ref 13.2–17.1)
LYMPHS ABS: 1758 {cells}/uL (ref 850–3900)
MCH: 27.6 pg (ref 27.0–33.0)
MCHC: 34.1 g/dL (ref 32.0–36.0)
MCV: 80.9 fL (ref 80.0–100.0)
MPV: 11.2 fL (ref 7.5–12.5)
Monocytes Relative: 7.8 %
NEUTROS ABS: 5516 {cells}/uL (ref 1500–7800)
Neutrophils Relative %: 68.1 %
Platelets: 192 10*3/uL (ref 140–400)
RBC: 5.55 10*6/uL (ref 4.20–5.80)
RDW: 13.9 % (ref 11.0–15.0)
Total Lymphocyte: 21.7 %
WBC: 8.1 10*3/uL (ref 3.8–10.8)
WBCMIX: 632 {cells}/uL (ref 200–950)

## 2018-02-05 LAB — LIPID PANEL
Cholesterol: 179 mg/dL (ref <200)
HDL: 32 mg/dL — ABNORMAL LOW (ref >40)
LDL CHOLESTEROL (CALC): 103 mg/dL — AB
Non-HDL Cholesterol (Calc): 147 mg/dL (calc) — ABNORMAL HIGH (ref <130)
Total CHOL/HDL Ratio: 5.6 (calc) — ABNORMAL HIGH (ref <5.0)
Triglycerides: 328 mg/dL — ABNORMAL HIGH (ref <150)

## 2018-02-05 LAB — COMPLETE METABOLIC PANEL WITH GFR
AG RATIO: 1.4 (calc) (ref 1.0–2.5)
ALBUMIN MSPROF: 4.3 g/dL (ref 3.6–5.1)
ALT: 12 U/L (ref 9–46)
AST: 12 U/L (ref 10–35)
Alkaline phosphatase (APISO): 58 U/L (ref 40–115)
BUN: 19 mg/dL (ref 7–25)
CO2: 26 mmol/L (ref 20–32)
Calcium: 10.2 mg/dL (ref 8.6–10.3)
Chloride: 99 mmol/L (ref 98–110)
Creat: 1.21 mg/dL (ref 0.70–1.25)
GFR, EST AFRICAN AMERICAN: 73 mL/min/{1.73_m2} (ref >=60)
GFR, Est Non African American: 63 mL/min/{1.73_m2} (ref >=60)
GLOBULIN: 3.1 g/dL (ref 1.9–3.7)
GLUCOSE: 217 mg/dL — AB (ref 65–99)
Potassium: 4.4 mmol/L (ref 3.5–5.3)
Sodium: 137 mmol/L (ref 135–146)
TOTAL PROTEIN: 7.4 g/dL (ref 6.1–8.1)
Total Bilirubin: 0.4 mg/dL (ref 0.2–1.2)

## 2018-02-05 LAB — HEMOGLOBIN A1C
HEMOGLOBIN A1C: 6.6 %{Hb} — AB (ref <5.7)
MEAN PLASMA GLUCOSE: 143 (calc)
eAG (mmol/L): 7.9 (calc)

## 2018-02-05 LAB — TSH: TSH: 4.02 m[IU]/L (ref 0.40–4.50)

## 2018-02-05 LAB — VITAMIN D 25 HYDROXY (VIT D DEFICIENCY, FRACTURES): Vit D, 25-Hydroxy: 31 ng/mL (ref 30–100)

## 2018-02-19 MED FILL — CARBIDOPA/LEVO 25/100 TAB: 25-100 | 90 days supply | Qty: 405 | Fill #0

## 2018-03-01 MED FILL — ALLOPURINOL 300 MG TAB: 300 | 90 days supply | Qty: 90 | Fill #2

## 2018-03-04 ENCOUNTER — Other Ambulatory Visit: Payer: Self-pay | Admitting: Internal Medicine

## 2018-03-04 MED FILL — metFORMIN HCL ER 500 MG TB2: 500 | 90 days supply | Qty: 360 | Fill #1

## 2018-03-04 MED FILL — JANUVIA 100 MG TABLET: 100 | 60 days supply | Qty: 60 | Fill #2

## 2018-03-04 MED FILL — VALSARTAN-HCTZ 320-25 MG TA: 320-25 | 30 days supply | Qty: 30 | Fill #0

## 2018-03-05 DIAGNOSIS — G4733 Obstructive sleep apnea (adult) (pediatric): Secondary | ICD-10-CM | POA: Diagnosis not present

## 2018-04-02 ENCOUNTER — Telehealth: Payer: Self-pay | Admitting: Neurology

## 2018-04-02 ENCOUNTER — Ambulatory Visit: Payer: 59

## 2018-04-02 ENCOUNTER — Ambulatory Visit: Payer: 59 | Admitting: Occupational Therapy

## 2018-04-02 ENCOUNTER — Ambulatory Visit: Payer: 59 | Attending: Internal Medicine | Admitting: Physical Therapy

## 2018-04-02 DIAGNOSIS — R278 Other lack of coordination: Secondary | ICD-10-CM

## 2018-04-02 DIAGNOSIS — G20 Parkinson's disease: Secondary | ICD-10-CM

## 2018-04-02 DIAGNOSIS — R29818 Other symptoms and signs involving the nervous system: Secondary | ICD-10-CM | POA: Insufficient documentation

## 2018-04-02 DIAGNOSIS — R2689 Other abnormalities of gait and mobility: Secondary | ICD-10-CM | POA: Insufficient documentation

## 2018-04-02 NOTE — Telephone Encounter (Signed)
I called the patient.  The patient is okay with PT, OT, and speech therapy, I will get this ordered.

## 2018-04-02 NOTE — Therapy (Signed)
[facility] [address] [place], [place], [address] Phone: [phone]   Fax:  [phone]  Patient Details  Name: Todd Rice MRN: [id number] Date of Birth: June 06, 1955 Referring Provider:  Unk Pinto, MD  Encounter Date: 04/02/2018  Physical Therapy Parkinson's Disease Screen   Timed Up and Go test: 13.72 sec  10 meter walk test: 10.50 sec = 3.12 ft/sec  5 time sit to stand test: 13.91 sec   Patient would benefit from Physical Therapy evaluation due to slowed mobility measures.       Aleria Maheu W. 04/02/2018, 1:41 PM  Frazier Butt., PT   [facility] [address] [place], [place], [address] Phone: [phone]   Fax:  [phone]

## 2018-04-02 NOTE — Telephone Encounter (Signed)
-----   Message from Frazier Butt, PT sent at 04/02/2018  1:59 PM EST ----- Regarding: Request for orders Dr. [name], Regis Bill was seen for PT, OT screens today and PT, OT recommend full evaluations due to slowed mobility measures, decline in coordination/R hand functional use since last bout of PT.  In addition, pt notes changes in voice volume and clarity, and may benefit from speech therapy.  If you agree, could you please send PT, OT, speech therapy orders? Thank you.   Mady Haagensen, PT

## 2018-04-02 NOTE — Therapy (Signed)
[facility] [address] [place], [place], [address] Phone: [phone]   Fax:  [phone]  Patient Details  Name: Todd Rice MRN: [id number] Date of Birth: [birth year] Referring Provider:  Unk Pinto, MD  Encounter Date: 04/02/2018  Occupational Therapy Parkinson's Disease Screen  Hand dominance:  right   Fastening/unfastening 3 buttons in:  43.0sec  9-hole peg test:    RUE  45.62 sec        LUE  30.09 sec  Change in ability to perform ADLs/IADLs:  Pt reports difficulty picking up pills.  Started switching to L hand at times while eating.  Difficulty manipulating coins.    Other Comments:  Pt reports incr tremor.  Pt reports that he is not doing exercises as regularly.   Pt reports that he is also having difficulty with voice volume, particularly with initiation and incr saliva (requests speech therapy eval)  Pt would benefit from occupational therapy evaluation due to  incr difficulty with dominant RUE functional use and coordination, incr difficulty with ADLs.    [facility] 04/02/2018, 1:21 PM  [facility] [address] [place], [place], [address] Phone: [phone]   Fax:  [place], OTR/L [facility] [address] [place], [place]  [address] [phone] phone [phone] 04/02/18 1:21 PM

## 2018-04-08 MED FILL — VALSARTAN-HCTZ 320-25 MG TA: 320-25 | 30 days supply | Qty: 30 | Fill #1

## 2018-04-09 ENCOUNTER — Other Ambulatory Visit: Payer: Self-pay

## 2018-04-09 ENCOUNTER — Ambulatory Visit: Payer: 59 | Attending: Internal Medicine

## 2018-04-09 DIAGNOSIS — M6281 Muscle weakness (generalized): Secondary | ICD-10-CM | POA: Diagnosis not present

## 2018-04-09 DIAGNOSIS — R278 Other lack of coordination: Secondary | ICD-10-CM | POA: Diagnosis not present

## 2018-04-09 DIAGNOSIS — R29898 Other symptoms and signs involving the musculoskeletal system: Secondary | ICD-10-CM | POA: Insufficient documentation

## 2018-04-09 DIAGNOSIS — R2681 Unsteadiness on feet: Secondary | ICD-10-CM | POA: Insufficient documentation

## 2018-04-09 DIAGNOSIS — R29818 Other symptoms and signs involving the nervous system: Secondary | ICD-10-CM | POA: Insufficient documentation

## 2018-04-09 DIAGNOSIS — R293 Abnormal posture: Secondary | ICD-10-CM | POA: Insufficient documentation

## 2018-04-09 DIAGNOSIS — R471 Dysarthria and anarthria: Secondary | ICD-10-CM | POA: Insufficient documentation

## 2018-04-09 DIAGNOSIS — R2689 Other abnormalities of gait and mobility: Secondary | ICD-10-CM | POA: Insufficient documentation

## 2018-04-09 NOTE — Patient Instructions (Signed)
Begin to do your loud "ah" again, x5 reps, x2/day. Practice your abdominal breathing for 10 minutes twice each day.

## 2018-04-09 NOTE — Therapy (Signed)
[facility] [address] [place], [place], [address] Phone: [phone]   Fax:  [phone]  Speech Language Pathology Evaluation  Patient Details  Name: Todd Rice MRN: [id number] Date of Birth: [birth year] Referring Provider (SLP): Neldon Newport., MD   Encounter Date: 04/09/2018  End of Session - 04/09/18 1644    Visit Number  1    Number of Visits  17    Date for SLP Re-Evaluation  06/28/18    SLP Start Time  1103    SLP Stop Time   1145    SLP Time Calculation (min)  42 min    Activity Tolerance  Patient tolerated treatment well       Past Medical History:  Diagnosis Date  . Adult BMI 50.0-59.9 kg/sq m    52.77  . Anal fissure   . Anxiety   . Diabetes mellitus without complication (HCC)    borderline  . GERD (gastroesophageal reflux disease)    30 years ago, maybe from peptic ulcer  . H/O: gout    STABLE  . Hepatic steatosis 10/01/11  . History of kidney stones   . Hyperlipidemia   . Hypertension   . Kidney tumor    right upper kidney tumor, monitoring  . OSA on CPAP    [facility]  . Parkinson disease ([place]) 01/04/2016  . REM sleep behavior disorder 12/06/2016  . Right ureteral stone   . Ureteral calculus, right 06/12/2011  . Vitamin D deficiency   . Weakness     Past Surgical History:  Procedure Laterality Date  . ANAL FISSURE REPAIR  10-12-2000  . COLONOSCOPY N/A 07/01/2012   Procedure: COLONOSCOPY;  Surgeon: Lafayette Dragon, MD;  Location: [facility];  Service: Endoscopy;  Laterality: N/A;  . CYSTOSCOPY WITH RETROGRADE PYELOGRAM, URETEROSCOPY AND STENT PLACEMENT Bilateral 02/26/2013   Procedure: CYSTOSCOPY WITH RETROGRADE PYELOGRAM, URETEROSCOPY AND STENT PLACEMENT;  Surgeon: Alexis Frock, MD;  Location: [facility];  Service: Urology;  Laterality: Bilateral;  . HOLMIUM LASER APPLICATION Bilateral 41/66/0630   Procedure: HOLMIUM LASER APPLICATION;  Surgeon: Alexis Frock, MD;  Location: [facility];   Service: Urology;  Laterality: Bilateral;  . kidney stone removal     06/2011-stent placement  . LEFT MEDIAL FEMORAL CONDYLE DEBRIDEMENT & DRILLING/ REMOVAL LOOSE BODY  09-15-2003  . NASAL SEPTUM SURGERY  1985  . STONE EXTRACTION WITH BASKET  06/12/2011   Procedure: STONE EXTRACTION WITH BASKET;  Surgeon: Claybon Jabs, MD;  Location: [facility];  Service: Urology;  Laterality: Right;    There were no vitals filed for this visit.  Subjective Assessment - 04/09/18 1113    Subjective  Pt and wife agree pt is softer than at d/c (06-08-17)    Patient is accompained by:  Family member   wife   Currently in Pain?  No/denies         SLP Evaluation [facility] - 04/09/18 1113      SLP Visit Information   SLP Received On  04/09/18    Referring Provider (SLP)  Neldon Newport., MD    Onset Date  August 2017    Medical Diagnosis  Parkinson's disease      General Information   HPI  Pt well known to this SLP from previous therapy courses. Pt and wife report pt is softer than at d/c from previous course of tx. Pt's dysphagia has diminished, per pt report, with effortful swallow with liquids.       Balance  Screen   Has the patient fallen in the past 6 months  No    Has the patient had a decrease in activity level because of a fear of falling?   No      Prior Functional Status   Cognitive/Linguistic Baseline  Within functional limits      Cognition   Overall Cognitive Status  Within Functional Limits for tasks assessed      Auditory Comprehension   Overall Auditory Comprehension  Appears within functional limits for tasks assessed      Verbal Expression   Overall Verbal Expression  Appears within functional limits for tasks assessed      Oral Motor/Sensory Function   Overall Oral Motor/Sensory Function  Impaired    Labial ROM  Within Functional Limits    Labial Symmetry  Within Functional Limits    Labial Strength  Within Functional Limits    Labial Coordination  WFL     Lingual ROM  Reduced left;Reduced right    Lingual Symmetry  Within Functional Limits    Lingual Strength  Reduced Left    Lingual Coordination  Reduced    Velum  Within Functional Limits      Motor Speech   Overall Motor Speech  Appears within functional limits for tasks assessed    Respiration  Impaired   clavicular breathing   Level of Impairment  Sentence    Phonation  Low vocal intensity    Phonation  Impaired   WNL volume (min-mod complex conv) for 60-90 sec, then varied     When a sound level meter was placed 30 cm away from pt's mouth, 10 minutes of conversational speech was reduced today, at average mid to upper 60s dB (WNL= average 70-72dB) with range of 60-72dB. Overall speech intelligibility for this listener in a quiet environment was not affected, at 100%. Production of loud /a/ averaged in the lower to mid 90s dB (range of 88 to 95) and min cues occasionally needed for loudness.   In conversation pt was asked to use louder voice. Loudness average with this increased effort was upper 60s dB (range of 63 to 72) with rare min A for loudness. Pt would benefit from skilled ST in order to improve speech intelligibility and pt's QOL.                 SLP Education - 04/09/18 1643    Education Details  re-educated re: abdominal breathing, need for loud /a/ daily    Person(s) Educated  Patient;Spouse    Methods  Explanation;Demonstration;Verbal cues    Comprehension  Verbalized understanding;Returned demonstration;Verbal cues required;Need further instruction       SLP Short Term Goals - 04/09/18 1649      SLP SHORT TERM GOAL #1   Title  pt will maintain loud /a/ average mid 90s dB with appropriate voicing over 4 sessions    Time  4    Period  Weeks    Status  New      SLP SHORT TERM GOAL #2   Title  pt will produce 20/20 sentences with average 70dB over two sessions    Time  4    Period  Weeks    Status  New      SLP SHORT TERM GOAL #3   Title  pt will  demo simple conversation of 10 minutes with average 70dB over two sessions    Time  4    Period  Weeks    Status  New       SLP Long Term Goals - 04/09/18 1649      SLP LONG TERM GOAL #1   Title  pt will maintain loud /a/ at mid 90s dB with proper voicing over 6 sessions    Time  8    Period  Weeks    Status  New      SLP LONG TERM GOAL #2   Title  pt will demo 10 minutes mod complex conversastion with average low 70sdB over two sessions    Time  8    Period  Weeks    Status  New      SLP LONG TERM GOAL #3   Title  pt will demo 15 minutes mod complex conversation with average low 70s dB outside ST room, over two sessions    Time  8    Period  Weeks    Status  New      SLP LONG TERM GOAL #4   Title  pt will demo 15 minutes mod complex conversation with abdominal breathing (AB) 75% of the time over two sessions    Time  8    Period  Weeks    Status  New       Plan - 04/09/18 1645    Clinical Impression Statement  Pt presents today with dysarthria characterized by reduced breath support for speech, and voice volume at subnormal levels (<70dB when sound level meter is placed 30 cm from pt's mouth). Cognitive linguistics, language skills appear WFL/WNL at this time. Pt would benefit from skilled ST to improve pt's communicative effectiveness as his wife reprots she needs to ask him to repeat utterances multiple times per day. Pt reports his dysphagia is much improved with compensatory technique of effortful swallow with liquids, and that he does not cough with any regularity with POs.     Speech Therapy Frequency  2x / week    Duration  --   8 weeks, or 17 sessions (possible 13 sessions?)   Treatment/Interventions  Compensatory strategies;Patient/family education;Functional tasks;Cueing hierarchy;Environmental controls;Internal/external aids;SLP instruction and feedback;Diet toleration management by SLP;Pharyngeal strengthening exercises;Aspiration precaution training    Potential  to Achieve Goals  Good    SLP Home Exercise Plan  provided today (/a/ and abdominal breathing)    Consulted and Agree with Plan of Care  Patient       Patient will benefit from skilled therapeutic intervention in order to improve the following deficits and impairments:   Dysarthria and anarthria    Problem List Patient Active Problem List   Diagnosis Date Noted  . Controlled type 2 diabetes mellitus with diabetic dermatitis, without long-term current use of insulin ([place]) 01/21/2018  . OSA treated with BiPAP 01/03/2017  . REM sleep behavior disorder 12/06/2016  . Uncontrolled secondary diabetes mellitus with stage 3 CKD (GFR 30-59) (HCC) 10/23/2016  . Medication management 10/23/2016  . Vitamin D deficiency 10/23/2016  . Parkinson disease ([place]) 01/04/2016  . Seasonal and perennial allergic rhinitis 06/21/2014  . Asthma, mild intermittent, well-controlled 06/21/2014  . Pseudomonas aeruginosa colonization 10/08/2012  . CAD (coronary artery disease) 11/15/2011  . Hypertension 11/15/2011  . Hyperlipidemia 11/15/2011  . Morbid obesity with BMI of 40.0-44.9, adult ([place]) 11/15/2011    [facility] ,[place], [place]  04/09/2018, 4:52 PM  [facility] [address] [place], [place], [address] Phone: [phone]   Fax:  [phone]  Name: Todd Rice MRN: [id number] Date of Birth: [birth year]

## 2018-04-10 ENCOUNTER — Ambulatory Visit: Payer: 59 | Admitting: Occupational Therapy

## 2018-04-10 DIAGNOSIS — M6281 Muscle weakness (generalized): Secondary | ICD-10-CM | POA: Diagnosis not present

## 2018-04-10 DIAGNOSIS — R29898 Other symptoms and signs involving the musculoskeletal system: Secondary | ICD-10-CM | POA: Diagnosis not present

## 2018-04-10 DIAGNOSIS — R29818 Other symptoms and signs involving the nervous system: Secondary | ICD-10-CM

## 2018-04-10 DIAGNOSIS — R471 Dysarthria and anarthria: Secondary | ICD-10-CM | POA: Diagnosis not present

## 2018-04-10 DIAGNOSIS — R2681 Unsteadiness on feet: Secondary | ICD-10-CM | POA: Diagnosis not present

## 2018-04-10 DIAGNOSIS — R278 Other lack of coordination: Secondary | ICD-10-CM | POA: Diagnosis not present

## 2018-04-10 DIAGNOSIS — R2689 Other abnormalities of gait and mobility: Secondary | ICD-10-CM

## 2018-04-10 DIAGNOSIS — R293 Abnormal posture: Secondary | ICD-10-CM | POA: Diagnosis not present

## 2018-04-10 NOTE — Therapy (Signed)
[facility] [address] [place], [place], [address] Phone: [phone]   Fax:  [phone]  Occupational Therapy Treatment  Patient Details  Name: Todd Rice MRN: [id number] Date of Birth: October 23, 1955 Referring Provider (OT): Dr. [name]   Encounter Date: 04/10/2018  OT End of Session - 04/10/18 1341    Visit Number  1    Number of Visits  17    Date for OT Re-Evaluation  06/09/18    Authorization Type  [employer] until end of December    Authorization Time Period  changing to new insurance in January    OT Start Time  1150    OT Stop Time  1230    OT Time Calculation (min)  40 min    Activity Tolerance  Patient tolerated treatment well    Behavior During Therapy  [facility] for tasks assessed/performed       Past Medical History:  Diagnosis Date  . Adult BMI 50.0-59.9 kg/sq m    52.77  . Anal fissure   . Anxiety   . Diabetes mellitus without complication (HCC)    borderline  . GERD (gastroesophageal reflux disease)    30 years ago, maybe from peptic ulcer  . H/O: gout    STABLE  . Hepatic steatosis 10/01/11  . History of kidney stones   . Hyperlipidemia   . Hypertension   . Kidney tumor    right upper kidney tumor, monitoring  . OSA on CPAP    [facility]  . Parkinson disease ([place]) 01/04/2016  . REM sleep behavior disorder 12/06/2016  . Right ureteral stone   . Ureteral calculus, right 06/12/2011  . Vitamin D deficiency   . Weakness     Past Surgical History:  Procedure Laterality Date  . ANAL FISSURE REPAIR  10-12-2000  . COLONOSCOPY N/A 07/01/2012   Procedure: COLONOSCOPY;  Surgeon: Lafayette Dragon, MD;  [facility]: [facility];  Service: Endoscopy;  Laterality: N/A;  . CYSTOSCOPY WITH RETROGRADE PYELOGRAM, URETEROSCOPY AND STENT PLACEMENT Bilateral 02/26/2013   Procedure: CYSTOSCOPY WITH RETROGRADE PYELOGRAM, URETEROSCOPY AND STENT PLACEMENT;  Surgeon: Alexis Frock, MD;  Location: [facility];  Service:  Urology;  Laterality: Bilateral;  . HOLMIUM LASER APPLICATION Bilateral 93/71/6967   Procedure: HOLMIUM LASER APPLICATION;  Surgeon: Alexis Frock, MD;  Location: [facility];  Service: Urology;  Laterality: Bilateral;  . kidney stone removal     06/2011-stent placement  . LEFT MEDIAL FEMORAL CONDYLE DEBRIDEMENT & DRILLING/ REMOVAL LOOSE BODY  09-15-2003  . NASAL SEPTUM SURGERY  1985  . STONE EXTRACTION WITH BASKET  06/12/2011   Procedure: STONE EXTRACTION WITH BASKET;  Surgeon: Claybon Jabs, MD;  Location: [facility];  Service: Urology;  Laterality: Right;    There were no vitals filed for this visit.  Subjective Assessment - 04/10/18 1354    Subjective   Pt reports increased difficulty using his RUE    Pertinent History  PMH: PD, DM, CAD, HTN, hyperlipidemia, kidney tumor. Pt is retired and lives with his wife. He enjoys travel    Patient Stated Goals  improve right hand function    Currently in Pain?  No/denies         [facility] OT Assessment - 04/10/18 1152      Assessment   Medical Diagnosis  Parkinson's disease    Referring Provider (OT)  Dr. [name]    Onset Date/Surgical Date  04/02/18    Hand Dominance  Right  Precautions   Precautions  Fall      Balance Screen   Has the patient fallen in the past 6 months  No    Has the patient had a decrease in activity level because of a fear of falling?   No    Is the patient reluctant to leave their home because of a fear of falling?   No      Home  Environment   Family/patient expects to be discharged to:  Private residence    Home Layout  Two level    Lives With  Spouse      Prior Function   Level of [place]  Retired    Leisure  travel,       ADL   Eating/Feeding  Modified independent   difficulty cutting food at times, switches to LUE at times   Grooming  Modified independent    Upper Body Bathing  Modified independent    Lower Body Bathing  Modified independent    Upper  Body Dressing  Increased time   modified  independent, difficulty with buttons/fasteners   Lower Body Dressing  Modified independent    Toilet Transfer  Modified independent    Tub/Shower Transfer  Modified independent    ADL comments  Pt reports increased difficutly with right hand use      IADL   Shopping  Takes care of all shopping needs independently    Light Housekeeping  Performs light daily tasks such as dishwashing, bed making    Meal Prep  Able to complete simple warm meal prep    Medication Management  Is responsible for taking medication in correct dosages at correct time      Mobility   Mobility Status  Independent      Written Expression   Dominant Hand  Right    Handwriting  100% legible;Increased time      Vision Assessment   Vision Assessment  Vision not tested      Cognition   Overall Cognitive Status  Within Functional Limits for tasks assessed      Observation/Other Assessments   Other Surveys   Select    Physical Performance Test    Yes    Simulated Eating Comments  12.97 secs    Donning Doffing Jacket Time (seconds)  3 button/ unbutton: 90.22 secs      Coordination   Fine Motor Movements are Fluid and Coordinated  No    9 Hole Peg Test  Right;Left    Right 9 Hole Peg Test  40.78 secs    Left 9 Hole Peg Test  31.40 secs    Box and Blocks  RUE 37 blocks, LUE 47 blocks      Tone   Assessment Location  Right Upper Extremity;Left Upper Extremity      ROM / Strength   AROM / PROM / Strength  AROM      AROM   Overall AROM Comments  shoulder flexion RUE 135, LUE 125, elbow extension RUE -10, LUE 0, decreased bilateral supination: RUE 75%, LUE 90 %,       RUE Tone   RUE Tone  Mild   rigidity                        OT Short Term Goals - 04/10/18 1419      OT SHORT TERM GOAL #1   Title  I with PD  specific HEP.  Time  4    Period  Weeks    Status  New      OT SHORT TERM GOAL #2   Title  I with adapted strategies for ADLs/  IADLs.    Time  4    Period  Weeks    Status  New      OT SHORT TERM GOAL #3   Title  Pt will demonstrate improved RUE functional use for ADLs as evidenced by increasing box/ blocks score to 42 blocks    Baseline  RUE 37, LUE 47    Time  4    Period  Weeks    Status  New        OT Long Term Goals - 04/10/18 1419      OT LONG TERM GOAL #1   Title  Pt will verbalize understanding of appropriate PD community resources.    Time  8    Period  Weeks    Status  New      OT LONG TERM GOAL #2   Title  Pt will demonstrate improved fine motor coordination for ADLS as evidenced by decreasing RUE 9 hole peg test score to 36 secs or less.    Baseline  RUE 40.78 secs, LUE 31.40 secs    Time  8    Period  Weeks    Status  New      OT LONG TERM GOAL #3   Title  Pt will perform 3 button / unbutton in 60 secs or less    Baseline  3 button/ unbutton 90.22    Time  8    Period  Weeks    Status  New            Plan - 04/10/18 1352    Clinical Impression Statement  Pt is a 62 y.o male with Parkinson's disease who returns to occupational therapy with a decline in fine motor coordination and ADLs. Pt. presents with the following deficits: decreased coordination, rigidity, decreased balance, tremor, and abnormal posture which impedes perfromance of ADLs/ IADLS. Pt can benefit from skilled occupational therapy to adress these deficits and to maximize safety and independence with daily activities.    Occupational Profile and client history currently impacting functional performance  PMH: PD, DM, CAD, HTN, hyperlipidemia, kidney tumor. Pt is retired and lives with his wife. He enjoys travel    Occupational performance deficits (Please refer to evaluation for details):  ADL's;Play;Leisure    Rehab Potential  Good    Current Impairments/barriers affecting progress:  tremor, bradykinesia    OT Frequency  2x / week   plus eval, anticipate d/c after 4-6 weeks   OT Duration  8 weeks    OT  Treatment/Interventions  Self-care/ADL training;Therapeutic exercise;Patient/family education;Neuromuscular education;Paraffin;Moist Heat;Aquatic Therapy;Fluidtherapy;Energy conservation;Therapeutic activities;Balance training;Functional Mobility Training;DME and/or AE instruction;Manual Therapy;Cryotherapy;Ultrasound;Contrast Bath    Plan  coordination HEP, PWR! hands    Clinical Decision Making  Limited treatment options, no task modification necessary    Consulted and Agree with Plan of Care  Patient       Patient will benefit from skilled therapeutic intervention in order to improve the following deficits and impairments:  Impaired flexibility, Decreased mobility, Decreased coordination, Decreased activity tolerance, Decreased endurance, Decreased range of motion, Decreased strength, Impaired UE functional use, Impaired tone, Decreased balance, Difficulty walking, Impaired perceived functional ability, Obesity  Visit Diagnosis: Other lack of coordination - Plan: Ot plan of care cert/re-cert  Other symptoms and signs involving the nervous  system - Plan: Ot plan of care cert/re-cert  Other symptoms and signs involving the musculoskeletal system - Plan: Ot plan of care cert/re-cert  Other abnormalities of gait and mobility - Plan: Ot plan of care cert/re-cert  Abnormal posture - Plan: Ot plan of care cert/re-cert    Problem List Patient Active Problem List   Diagnosis Date Noted  . Controlled type 2 diabetes mellitus with diabetic dermatitis, without long-term current use of insulin ([place]) 01/21/2018  . OSA treated with BiPAP 01/03/2017  . REM sleep behavior disorder 12/06/2016  . Uncontrolled secondary diabetes mellitus with stage 3 CKD (GFR 30-59) (HCC) 10/23/2016  . Medication management 10/23/2016  . Vitamin D deficiency 10/23/2016  . Parkinson disease ([place]) 01/04/2016  . Seasonal and perennial allergic rhinitis 06/21/2014  . Asthma, mild intermittent, well-controlled 06/21/2014   . Pseudomonas aeruginosa colonization 10/08/2012  . CAD (coronary artery disease) 11/15/2011  . Hypertension 11/15/2011  . Hyperlipidemia 11/15/2011  . Morbid obesity with BMI of 40.0-44.9, adult ([place]) 11/15/2011    , 04/10/2018, 2:23 PM Theone Murdoch, OTR/L [phone] Phone: [phone] 2:23 PM 04/10/18 [facility] [address] [place], [place], [address] Phone: [phone]   Fax:  [phone]  Name: Todd Rice MRN: [id number] Date of Birth: 18-Apr-1956

## 2018-04-17 ENCOUNTER — Encounter: Payer: Self-pay | Admitting: Physical Therapy

## 2018-04-17 ENCOUNTER — Ambulatory Visit: Payer: 59

## 2018-04-17 ENCOUNTER — Ambulatory Visit: Payer: 59 | Admitting: Physical Therapy

## 2018-04-17 ENCOUNTER — Ambulatory Visit: Payer: 59 | Admitting: Occupational Therapy

## 2018-04-17 DIAGNOSIS — R2689 Other abnormalities of gait and mobility: Secondary | ICD-10-CM

## 2018-04-17 DIAGNOSIS — R29818 Other symptoms and signs involving the nervous system: Secondary | ICD-10-CM | POA: Diagnosis not present

## 2018-04-17 DIAGNOSIS — R471 Dysarthria and anarthria: Secondary | ICD-10-CM | POA: Diagnosis not present

## 2018-04-17 DIAGNOSIS — R278 Other lack of coordination: Secondary | ICD-10-CM

## 2018-04-17 DIAGNOSIS — R29898 Other symptoms and signs involving the musculoskeletal system: Secondary | ICD-10-CM | POA: Diagnosis not present

## 2018-04-17 DIAGNOSIS — R2681 Unsteadiness on feet: Secondary | ICD-10-CM

## 2018-04-17 DIAGNOSIS — M6281 Muscle weakness (generalized): Secondary | ICD-10-CM | POA: Diagnosis not present

## 2018-04-17 DIAGNOSIS — R293 Abnormal posture: Secondary | ICD-10-CM

## 2018-04-17 NOTE — Patient Instructions (Signed)
Wear a rubber band around your wrist and every time you feel it, remind yourself to talk loudly

## 2018-04-17 NOTE — Patient Instructions (Signed)
PWR! Hands  With arms stretched out in front of you (elbows straight), perform the following:  PWR! Rock: Move wrists up and down [employer]! Twist: Twist palms up and down BIG  Then, start with elbows bent and hands closed.  PWR! Step: Touch index finger to thumb while keeping other fingers straight. Flick fingers out BIG (thumb out/straighten fingers). Repeat with other fingers. (Step your thumb to each finger).  PWR! Hands: Push hands out BIG. Elbows straight, wrists up, fingers open and spread apart BIG. (Can also perform by pushing down on table, chair, knees. Push above head, out to the side, behind you, in front of you.)     Coordination Exercises  Perform the following exercises for 20 minutes 1 times per day. Perform with both hand(s). Perform using big movements.   Flipping Cards: Place deck of cards on the table. Flip cards over by opening your hand big to grasp and then turn your palm up big.  Deal cards: Hold 1/2 or whole deck in your hand. Use thumb to push card off top of deck with one big push.  Rotate ball with fingertips: Pick up with fingers/thumb and move as much as you can with each turn/movement (clockwise and counter-clockwise).  Toss ball from one hand to the other: Toss big/high.  Toss ball in the air and catch with the same hand: Toss big/high.  Pick up coins and place in coin bank or container: Pick up with big, intentional movements. Do not drag coin to the edge.  Pick up coins and stack one at a time: Pick up with big, intentional movements. Do not drag coin to the edge. (5-10 in a stack)  Pick up 5-10 coins one at a time and hold in palm. Then, move coins from palm to fingertips one at time and place in coin bank/container.  Practice writing: Slow down, write big, and focus on forming each letter.  Practice typing.  Perform "Flicks"/hand stretches (PWR! Hands): Close hands then flick out your fingers with focus on opening hands, pulling wrists  back, and extending elbows like you are pushing.    Make each movement big and deliberate so that you feel the movement.  Perform at least 10 repetitions 1x/day, but perform PWR! hands throughout the day when you are having trouble using your hands (picking up/manipulating small objects, writing, eating, typing, sewing, buttoning, etc.).

## 2018-04-17 NOTE — Therapy (Signed)
[facility] [address] [place], [place], [address] Phone: [phone]   Fax:  [phone]  Speech Language Pathology Treatment  Patient Details  Name: Todd Rice MRN: [id number] Date of Birth: Mar 22, 1956 Referring Provider (SLP): Neldon Newport., MD   Encounter Date: 04/17/2018  End of Session - 04/17/18 1456    Visit Number  2    Number of Visits  17    Date for SLP Re-Evaluation  06/28/18    SLP Start Time  6948    SLP Stop Time   1445    SLP Time Calculation (min)  42 min    Activity Tolerance  Patient tolerated treatment well       Past Medical History:  Diagnosis Date  . Adult BMI 50.0-59.9 kg/sq m    52.77  . Anal fissure   . Anxiety   . Diabetes mellitus without complication (HCC)    borderline  . GERD (gastroesophageal reflux disease)    30 years ago, maybe from peptic ulcer  . H/O: gout    STABLE  . Hepatic steatosis 10/01/11  . History of kidney stones   . Hyperlipidemia   . Hypertension   . Kidney tumor    right upper kidney tumor, monitoring  . OSA on CPAP    [facility]  . Parkinson disease ([place]) 01/04/2016  . REM sleep behavior disorder 12/06/2016  . Right ureteral stone   . Ureteral calculus, right 06/12/2011  . Vitamin D deficiency   . Weakness     Past Surgical History:  Procedure Laterality Date  . ANAL FISSURE REPAIR  10-12-2000  . COLONOSCOPY N/A 07/01/2012   Procedure: COLONOSCOPY;  Surgeon: Lafayette Dragon, MD;  [facility];  Service: Endoscopy;  Laterality: N/A;  . CYSTOSCOPY WITH RETROGRADE PYELOGRAM, URETEROSCOPY AND STENT PLACEMENT Bilateral 02/26/2013   Procedure: CYSTOSCOPY WITH RETROGRADE PYELOGRAM, URETEROSCOPY AND STENT PLACEMENT;  Surgeon: Alexis Frock, MD;  [facility];  Service: Urology;  Laterality: Bilateral;  . HOLMIUM LASER APPLICATION Bilateral 54/62/7035   Procedure: HOLMIUM LASER APPLICATION;  Surgeon: Alexis Frock, MD;  Location: [facility];   Service: Urology;  Laterality: Bilateral;  . kidney stone removal     06/2011-stent placement  . LEFT MEDIAL FEMORAL CONDYLE DEBRIDEMENT & DRILLING/ REMOVAL LOOSE BODY  09-15-2003  . NASAL SEPTUM SURGERY  1985  . STONE EXTRACTION WITH BASKET  06/12/2011   Procedure: STONE EXTRACTION WITH BASKET;  Surgeon: Claybon Jabs, MD;  Location: [facility];  Service: Urology;  Laterality: Right;    There were no vitals filed for this visit.  Subjective Assessment - 04/17/18 1413    Subjective  "Part of my throat's clogged."    Currently in Pain?  No/denies            ADULT SLP TREATMENT - 04/17/18 1417      General Information   Behavior/Cognition  Alert;Cooperative;Pleasant mood      Treatment Provided   Treatment provided  Cognitive-Linquistic      Cognitive-Linquistic Treatment   Treatment focused on  Dysarthria    Skilled Treatment  Pt sniffling today upon entering ST room. SLP used loud /a/ to recalibrate pt's conversational loudness - average low to mid 90's dB. SLP engaged pt in conversation re: past work experience  (min-mod complex) and pt's hobbies for 18 minutes with low 70s dB. When SLP incr'd complexity (how to make his spaghetti sauce), pt reduced the volume of his speech to low 70s  dB average.       Assessment / Recommendations / Plan   Plan  Continue with current plan of care      Progression Toward Goals   Progression toward goals  Progressing toward goals       SLP Education - 04/17/18 1455    Education Details  external cues (rubber band)    Person(s) Educated  Patient    Methods  Explanation    Comprehension  Verbalized understanding       SLP Short Term Goals - 04/17/18 1458      SLP SHORT TERM GOAL #1   Title  pt will maintain loud /a/ average mid 90s dB with appropriate voicing over 4 sessions    Time  4    Period  Weeks    Status  On-going      SLP SHORT TERM GOAL #2   Title  pt will produce 20/20 sentences with average 70dB over  two sessions    Time  4    Period  Weeks    Status  On-going      SLP SHORT TERM GOAL #3   Title  pt will demo simple conversation of 10 minutes with average 70dB over two sessions    Time  4    Period  Weeks    Status  On-going       SLP Long Term Goals - 04/17/18 1458      SLP LONG TERM GOAL #1   Title  pt will maintain loud /a/ at mid 90s dB with proper voicing over 6 sessions    Time  8    Period  Weeks    Status  On-going      SLP LONG TERM GOAL #2   Title  pt will demo 10 minutes mod complex conversastion with average low 70sdB over two sessions    Time  8    Period  Weeks    Status  On-going      SLP LONG TERM GOAL #3   Title  pt will demo 15 minutes mod complex conversation with average low 70s dB outside ST room, over two sessions    Time  8    Period  Weeks    Status  On-going      SLP LONG TERM GOAL #4   Title  pt will demo 15 minutes mod complex conversation with abdominal breathing (AB) 75% of the time over two sessions    Time  8    Period  Weeks    Status  On-going       Plan - 04/17/18 1456    Clinical Impression Statement  Pt with good success today with conversational loudness in simple-mod compelx conversation, when conversation incr'd in complexity, pt's loudness decr'd to mid-upper 60s dB. Pt cont to present today with dysarthria characterized by reduced breath support for speech, and decr'd voice volume. Pt would cont to benefit from skilled ST to improve pt's communicative effectiveness as his wife reprots she needs to ask him to repeat utterances multiple times per day. Pt reports his dysphagia is much improved with compensatory technique of effortful swallow with liquids, and that he does not cough with any regularity with POs.     Speech Therapy Frequency  2x / week    Duration  --   8 weeks, or 17 sessions (possible 13 sessions?)   Treatment/Interventions  Compensatory strategies;Patient/family education;Functional tasks;Cueing  hierarchy;Environmental controls;Internal/external aids;SLP instruction and feedback;Diet toleration management by SLP;Pharyngeal  strengthening exercises;Aspiration precaution training    Potential to Achieve Goals  Good    SLP Home Exercise Plan  provided today (/a/ and abdominal breathing)    Consulted and Agree with Plan of Care  Patient       Patient will benefit from skilled therapeutic intervention in order to improve the following deficits and impairments:   Dysarthria and anarthria    Problem List Patient Active Problem List   Diagnosis Date Noted  . Controlled type 2 diabetes mellitus with diabetic dermatitis, without long-term current use of insulin ([place]) 01/21/2018  . OSA treated with BiPAP 01/03/2017  . REM sleep behavior disorder 12/06/2016  . Uncontrolled secondary diabetes mellitus with stage 3 CKD (GFR 30-59) (HCC) 10/23/2016  . Medication management 10/23/2016  . Vitamin D deficiency 10/23/2016  . Parkinson disease ([place]) 01/04/2016  . Seasonal and perennial allergic rhinitis 06/21/2014  . Asthma, mild intermittent, well-controlled 06/21/2014  . Pseudomonas aeruginosa colonization 10/08/2012  . CAD (coronary artery disease) 11/15/2011  . Hypertension 11/15/2011  . Hyperlipidemia 11/15/2011  . Morbid obesity with BMI of 40.0-44.9, adult ([place]) 11/15/2011    [facility] ,[place], [place]  04/17/2018, 2:58 PM  [facility] [address] [place], [place], [address] Phone: [phone]   Fax:  [phone]   Name: Todd Rice MRN: [id number] Date of Birth: 08/14/1955

## 2018-04-17 NOTE — Therapy (Signed)
[facility] [address] [place], [place], [address] Phone: [phone]   Fax:  [phone]  Occupational Therapy Treatment  Patient Details  Name: Todd Rice MRN: [id number] Date of Birth: [birth year] Referring Provider (OT): Dr. [name]   Encounter Date: 04/17/2018  OT End of Session - 04/17/18 1452    Visit Number  2    Number of Visits  17    Date for OT Re-Evaluation  06/09/18    Authorization Type  [employer] until end of December    Authorization Time Period  changing to new insurance in January    OT Start Time  1448    OT Stop Time  1530    OT Time Calculation (min)  42 min    Activity Tolerance  Patient tolerated treatment well    Behavior During Therapy  [facility] for tasks assessed/performed       Past Medical History:  Diagnosis Date  . Adult BMI 50.0-59.9 kg/sq m    52.77  . Anal fissure   . Anxiety   . Diabetes mellitus without complication (HCC)    borderline  . GERD (gastroesophageal reflux disease)    30 years ago, maybe from peptic ulcer  . H/O: gout    STABLE  . Hepatic steatosis 10/01/11  . History of kidney stones   . Hyperlipidemia   . Hypertension   . Kidney tumor    right upper kidney tumor, monitoring  . OSA on CPAP    [facility]  . Parkinson disease ([place]) 01/04/2016  . REM sleep behavior disorder 12/06/2016  . Right ureteral stone   . Ureteral calculus, right 06/12/2011  . Vitamin D deficiency   . Weakness     Past Surgical History:  Procedure Laterality Date  . ANAL FISSURE REPAIR  10-12-2000  . COLONOSCOPY N/A 07/01/2012   Procedure: COLONOSCOPY;  Surgeon: Lafayette Dragon, MD;  [facility];  Service: Endoscopy;  Laterality: N/A;  . CYSTOSCOPY WITH RETROGRADE PYELOGRAM, URETEROSCOPY AND STENT PLACEMENT Bilateral 02/26/2013   Procedure: CYSTOSCOPY WITH RETROGRADE PYELOGRAM, URETEROSCOPY AND STENT PLACEMENT;  Surgeon: Alexis Frock, MD;  [facility];  Service:  Urology;  Laterality: Bilateral;  . HOLMIUM LASER APPLICATION Bilateral 30/11/6224   Procedure: HOLMIUM LASER APPLICATION;  Surgeon: Alexis Frock, MD;  [facility];  Service: Urology;  Laterality: Bilateral;  . kidney stone removal     06/2011-stent placement  . LEFT MEDIAL FEMORAL CONDYLE DEBRIDEMENT & DRILLING/ REMOVAL LOOSE BODY  09-15-2003  . NASAL SEPTUM SURGERY  1985  . STONE EXTRACTION WITH BASKET  06/12/2011   Procedure: STONE EXTRACTION WITH BASKET;  Surgeon: Claybon Jabs, MD;  Location: [facility];  Service: Urology;  Laterality: Right;    There were no vitals filed for this visit.  Subjective Assessment - 04/17/18 1451    Subjective   denies pain    Pertinent History  PMH: PD, DM, CAD, HTN, hyperlipidemia, kidney tumor. Pt is retired and lives with his wife. He enjoys travel    Patient Stated Goals  improve right hand function    Currently in Pain?  No/denies              Treatment: Dynamic step and reach with right and left UE's to place large pegs into pegboard with rihgt and left UE's with trunk rotation, min v.c for larger amplitude movements.            OT Treatment/Education - 04/17/18 1645  Education Details  PWR! hands,5-10 reps each, coordination HEP    Person(s) Educated  Patient    Methods  Explanation;Demonstration;Handout;Verbal cues    Comprehension  Verbalized understanding;Returned demonstration;Verbal cues required       OT Short Term Goals - 04/10/18 1419      OT SHORT TERM GOAL #1   Title  I with PD  specific HEP.    Time  4    Period  Weeks    Status  New      OT SHORT TERM GOAL #2   Title  I with adapted strategies for ADLs/ IADLs.    Time  4    Period  Weeks    Status  New      OT SHORT TERM GOAL #3   Title  Pt will demonstrate improved RUE fucntional use for ADLs as evidenced by increasing box/ blocks score to 42 blocks    Baseline  RUE 37, LUE 47    Time  4    Period  Weeks    Status  New         OT Long Term Goals - 04/10/18 1419      OT LONG TERM GOAL #1   Title  Pt will verbalize understanding of appropriate PD community resources.    Time  8    Period  Weeks    Status  New      OT LONG TERM GOAL #2   Title  Pt will demonstrate improved fine motor coordination for ADLSas evidenced by decreasing RUE 9 hole peg test score to 36 secs or less.    Baseline  RUE 40.78 secs, LUE 31.40 secs    Time  8    Period  Weeks    Status  New      OT LONG TERM GOAL #3   Title  Pt will perform 3 button / unbutton in 60 secs or less    Baseline  3 button/ unbutton 90.22    Time  8    Period  Weeks    Status  New            Plan - 04/17/18 1646    Clinical Impression Statement  Pt is progressing towards goals.He demonstrates understanding of coordination HEP.    Occupational Profile and client history currently impacting functional performance  PMH: PD, DM, CAD, HTN, hyperlipidemia, kidney tumor. Pt is retired and lives with his wife. He enjoys travel    Occupational performance deficits (Please refer to evaluation for details):  ADL's;Play;Leisure    Rehab Potential  Good    Current Impairments/barriers affecting progress:  tremor, bradykinesia    OT Frequency  2x / week    OT Duration  8 weeks    OT Treatment/Interventions  Self-care/ADL training;Therapeutic exercise;Patient/family education;Neuromuscular education;Paraffin;Moist Heat;Aquatic Therapy;Fluidtherapy;Energy conservation;Therapeutic activities;Balance training;Functional Mobility Training;DME and/or AE instruction;Manual Therapy;Cryotherapy;Ultrasound;Contrast Bath    Plan  dynamic step and reach    Consulted and Agree with Plan of Care  Patient       Patient will benefit from skilled therapeutic intervention in order to improve the following deficits and impairments:  Impaired flexibility, Decreased mobility, Decreased coordination, Decreased activity tolerance, Decreased endurance, Decreased range of motion,  Decreased strength, Impaired UE functional use, Impaired tone, Decreased balance, Difficulty walking, Impaired perceived functional ability, Obesity  Visit Diagnosis: Other lack of coordination  Other symptoms and signs involving the nervous system  Other symptoms and signs involving the musculoskeletal system  Other abnormalities of gait  and mobility  Abnormal posture    Problem List Patient Active Problem List   Diagnosis Date Noted  . Controlled type 2 diabetes mellitus with diabetic dermatitis, without long-term current use of insulin ([place]) 01/21/2018  . OSA treated with BiPAP 01/03/2017  . REM sleep behavior disorder 12/06/2016  . Uncontrolled secondary diabetes mellitus with stage 3 CKD (GFR 30-59) (HCC) 10/23/2016  . Medication management 10/23/2016  . Vitamin D deficiency 10/23/2016  . Parkinson disease ([place]) 01/04/2016  . Seasonal and perennial allergic rhinitis 06/21/2014  . Asthma, mild intermittent, well-controlled 06/21/2014  . Pseudomonas aeruginosa colonization 10/08/2012  . CAD (coronary artery disease) 11/15/2011  . Hypertension 11/15/2011  . Hyperlipidemia 11/15/2011  . Morbid obesity with BMI of 40.0-44.9, adult ([place]) 11/15/2011    Shanetra Blumenstock 04/17/2018, 4:47 PM  [facility] [address] [place], [place], [address] Phone: [phone]   Fax:  [phone]  Name: Todd Rice MRN: [id number] Date of Birth: 1956/03/16

## 2018-04-18 NOTE — Therapy (Signed)
[facility] [address] [place], [place], [address] Phone: [phone]   Fax:  [phone]  Physical Therapy Evaluation  Patient Details  Name: Todd Rice MRN: [id number] Date of Birth: 1956-03-28 Referring Provider (PT): Margette Fast   Encounter Date: 04/17/2018  PT End of Session - 04/18/18 1459    Visit Number  1    Number of Visits  10    Date for PT Re-Evaluation  06/17/18    Authorization Type  [employer]    PT Start Time  0803    PT Stop Time  0846    PT Time Calculation (min)  43 min    Activity Tolerance  Patient tolerated treatment well    Behavior During Therapy  [facility] for tasks assessed/performed       Past Medical History:  Diagnosis Date  . Adult BMI 50.0-59.9 kg/sq m    52.77  . Anal fissure   . Anxiety   . Diabetes mellitus without complication (HCC)    borderline  . GERD (gastroesophageal reflux disease)    30 years ago, maybe from peptic ulcer  . H/O: gout    STABLE  . Hepatic steatosis 10/01/11  . History of kidney stones   . Hyperlipidemia   . Hypertension   . Kidney tumor    right upper kidney tumor, monitoring  . OSA on CPAP    [facility]  . Parkinson disease ([place]) 01/04/2016  . REM sleep behavior disorder 12/06/2016  . Right ureteral stone   . Ureteral calculus, right 06/12/2011  . Vitamin D deficiency   . Weakness     Past Surgical History:  Procedure Laterality Date  . ANAL FISSURE REPAIR  10-12-2000  . COLONOSCOPY N/A 07/01/2012   Procedure: COLONOSCOPY;  Surgeon: Lafayette Dragon, MD;  [facility];  Service: Endoscopy;  Laterality: N/A;  . CYSTOSCOPY WITH RETROGRADE PYELOGRAM, URETEROSCOPY AND STENT PLACEMENT Bilateral 02/26/2013   Procedure: CYSTOSCOPY WITH RETROGRADE PYELOGRAM, URETEROSCOPY AND STENT PLACEMENT;  Surgeon: Alexis Frock, MD;  [facility];  Service: Urology;  Laterality: Bilateral;  . HOLMIUM LASER APPLICATION Bilateral 69/67/8938   Procedure: HOLMIUM LASER APPLICATION;  Surgeon: Alexis Frock, MD;  [facility];  Service: Urology;  Laterality: Bilateral;  . kidney stone removal     06/2011-stent placement  . LEFT MEDIAL FEMORAL CONDYLE DEBRIDEMENT & DRILLING/ REMOVAL LOOSE BODY  09-15-2003  . NASAL SEPTUM SURGERY  1985  . STONE EXTRACTION WITH BASKET  06/12/2011   Procedure: STONE EXTRACTION WITH BASKET;  Surgeon: Claybon Jabs, MD;  Location: [facility];  Service: Urology;  Laterality: Right;    There were no vitals filed for this visit.   Subjective Assessment - 04/17/18 0806    Subjective  Pt reports slower getting out of bed and moving, some days more than others.  Harder to get in and out of car.  No reports of falls, stumbles.  Does not use assistive device    Patient Stated Goals  Pt's goals for therapy are to get stamina back, consistently everyday.    Currently in Pain?  No/denies         [facility] PT Assessment - 04/18/18 1522      Assessment   Medical Diagnosis  Parkinson's disease    Referring Provider (PT)  Margette Fast    Onset Date/Surgical Date  04/02/18    Hand Dominance  Right      Precautions   Precautions  Fall  Balance Screen   Has the patient fallen in the past 6 months  No    Has the patient had a decrease in activity level because of a fear of falling?   No    Is the patient reluctant to leave their home because of a fear of falling?   No      Home Social worker  Private residence    Living Arrangements  Spouse/significant other    Available Help at Discharge  Family    Type of [place] to enter    Entrance Stairs-Number of Steps  1    Entrance Stairs-Rails  None    Home Layout  Two level;Able to live on main level with bedroom/bathroom    Additional Comments  Walks the dog, does household chores      Prior Function   Level of [place]  Retired    Leisure  Enjoys travel, walks  the dog      Observation/Other Assessments   Focus on Therapeutic Outcomes (FOTO)   NA      Posture/Postural Control   Posture/Postural Control  Postural limitations    Postural Limitations  Forward head;Rounded Shoulders      Tone   Assessment Location  Right Lower Extremity      ROM / Strength   AROM / PROM / Strength  Strength      Strength   Overall Strength  Within functional limits for tasks performed    Overall Strength Comments  Grossly tested at least 4/5 bilaterally      Transfers   Transfers  Sit to Stand;Stand to Sit    Sit to Stand  6: Modified independent (Device/Increase time);Without upper extremity assist;From chair/3-in-1    Five time sit to stand comments   11.4    Stand to Sit  6: Modified independent (Device/Increase time);Without upper extremity assist;To chair/3-in-1    Comments  Reports increased difficulty with car transfers, particularly exiting car/truck      Ambulation/Gait   Ambulation/Gait  Yes    Ambulation/Gait Assistance  7: Independent    Ambulation Distance (Feet)  150 Feet    Assistive device  None    Gait Pattern  Decreased arm swing - right;Decreased arm swing - left;Decreased step length - right;Decreased trunk rotation    Ambulation Surface  Level;Indoor    Gait velocity  10.69 sec = 3.07 ft/sec      6 Minute Walk- Baseline   6 Minute Walk- Baseline  yes    HR (bpm)  97    02 Sat (%RA)  102 %      6 Minute walk- Post Test   6 Minute Walk Post Test  yes    HR (bpm)  97    02 Sat (%RA)  109 %    Modified Borg Scale for Dyspnea  1- Very mild shortness of breath    Perceived Rate of Exertion (Borg)  11- Fairly light      6 minute walk test results    Aerobic Endurance Distance Walked  1224    Endurance additional comments  186 ft 1st minute; 218 ft 6th minute      High Level Balance   High Level Balance Comments  MiniBESTest score:  25/28 (pt feels unsteady with eyes closed on foam), slowed head turns with gait, slowed stepping  over obstacles, decreased SLS time  RLE Tone   RLE Tone  Mild   rigidity               Objective measurements completed on examination: See above findings.                   PT Long Term Goals - 04/18/18 1516      PT LONG TERM GOAL #1   Title  Pt will be independent with Parkinson's specific HEP for improved balance and gait.  TARGET 06/07/18 (due to delayed scheduling with holidays)    Time  5    Period  Weeks    Status  New    Target Date  06/07/18      PT LONG TERM GOAL #2   Title  Pt will perform 5x sit<>stand in less than 11 seconds for improved transfer efficiency and safety.    Time  5    Period  Weeks    Status  New    Target Date  06/07/18      PT LONG TERM GOAL #3   Title  Pt will ambulate at least 1500 ft in 6 MWT, for improved gait endurance and efficiency.    Time  5    Period  Weeks    Status  New    Target Date  06/07/18      PT LONG TERM GOAL #4   Title  Pt will verbalize plans for continued community fitness upon d/c from PT.    Time  5    Period  Weeks    Status  New    Target Date  06/07/18             Plan - 04/18/18 1505    Clinical Impression Statement  Pt is a 63 year old male who presents to OP PT with history of Parkinson's disease, seen for bout of PT ending 06/05/17.  Pt returned for PT screen in November, with slowing of mobility measures compared to January d/c measures.  Pt demonstrates decreased functional strength, decreased gait endurance in 6 MWT compared with age related norms of >1800 ft, slowed gait velocity.  Pt does not currently participate in home exercises or community exercises.  Pt is recently retired, enjoys walking his dog and travelling.  He will benefit from skilled PT to address mobility, dynamic balance activities for improved functional mobility and decreased fall risk.    History and Personal Factors relevant to plan of care:  PMH > 3 co-morbidities; history of Parkinson's    Clinical  Presentation  Stable    Clinical Presentation due to:  Parkinson's disease as neurodegenerative disease    Clinical Decision Making  Low    Rehab Potential  Good    PT Frequency  Other (comment)   1x/wk for 1 week, then 2x/wk for 4 weeks   PT Duration  --   total POC = 5 weeks   PT Treatment/Interventions  ADLs/Self Care Home Management;Therapeutic exercise;Therapeutic activities;Functional mobility training;Gait training;Balance training;Neuromuscular re-education;Patient/family education    PT Next Visit Plan  Initiate HEP for standing PWR! Moves, walking program; work on balance on compliant surfaces    Consulted and Agree with Plan of Care  Patient       Patient will benefit from skilled therapeutic intervention in order to improve the following deficits and impairments:  Abnormal gait, Decreased balance, Decreased endurance, Decreased mobility, Difficulty walking, Decreased strength, Impaired tone, Postural dysfunction  Visit Diagnosis: Other abnormalities of gait and mobility  Abnormal posture  Unsteadiness on feet  Muscle weakness (generalized)     Problem List Patient Active Problem List   Diagnosis Date Noted  . Controlled type 2 diabetes mellitus with diabetic dermatitis, without long-term current use of insulin ([place]) 01/21/2018  . OSA treated with BiPAP 01/03/2017  . REM sleep behavior disorder 12/06/2016  . Uncontrolled secondary diabetes mellitus with stage 3 CKD (GFR 30-59) (HCC) 10/23/2016  . Medication management 10/23/2016  . Vitamin D deficiency 10/23/2016  . Parkinson disease ([place]) 01/04/2016  . Seasonal and perennial allergic rhinitis 06/21/2014  . Asthma, mild intermittent, well-controlled 06/21/2014  . Pseudomonas aeruginosa colonization 10/08/2012  . CAD (coronary artery disease) 11/15/2011  . Hypertension 11/15/2011  . Hyperlipidemia 11/15/2011  . Morbid obesity with BMI of 40.0-44.9, adult ([place]) 11/15/2011    Khalia Gong W. 04/18/2018, 3:24 PM   Frazier Butt., PT  [facility] [address] [place], [place], [address] Phone: [phone]   Fax:  [phone]  Name: Todd Rice MRN: [id number] Date of Birth: [birth year]

## 2018-04-23 NOTE — Progress Notes (Signed)
Assessment and Plan:  Hypertension:  -Continue medication,  -monitor blood pressure at home.  -Continue DASH diet.   -Reminder to go to the ER if any CP, SOB, nausea, dizziness, severe HA, changes vision/speech, left arm numbness and tingling, and jaw pain.  Cholesterol: -Continue diet and exercise.  -Check cholesterol.   Diabetes: -recommend that if further worsening may need medication -try for weight loss for help with parkinsons -Continue diet and exercise.  -Check A1C  Parkinsons -cont medications -followed by neuro -some improvements  Depression - start wellbutrin, stress management techniques discussed, increase water, good sleep hygiene discussed, increase exercise, and increase veggies.  Follow up 4-6 weeks  Obesity - follow up 3 months for progress monitoring - increase veggies, decrease carbs - long discussion about weight loss, diet, and exercise   Continue diet and meds as discussed. Further disposition pending results of labs. Future Appointments  Date Time Provider [place]  05/10/2018  8:45 AM Frazier Butt, PT [facility]  05/10/2018  9:30 AM Rine, Selmer Dominion, OT [facility]  05/10/2018 10:15 AM Sharen Counter, CCC-SLP [facility]  05/14/2018  9:30 AM Sharen Counter, CCC-SLP [facility]  05/14/2018 10:15 AM Delton Prairie, OT [facility]  05/14/2018 11:00 AM Frazier Butt, PT [facility]  05/17/2018  8:00 AM Kathrynn Ducking, MD [facility]NA[facility]GNA None  05/17/2018  8:45 AM Frazier Butt, PT [facility]  05/17/2018  9:30 AM Jairo Ben, Selmer Dominion, OT [facility]  05/17/2018 10:15 AM Sharen Counter, CCC-SLP [facility]  05/21/2018  8:45 AM Lovvorn, Hewitt Shorts, CCC-SLP [facility]  05/21/2018  9:30 AM Rine, Selmer Dominion, OT [facility]  05/21/2018 10:15 AM Frazier Butt, PT [facility]  05/24/2018  8:45 AM Valentino Saxon, [place], CCC-SLP [facility]  05/24/2018  9:30 AM Hulda Marin, OT [facility]  05/24/2018 10:15 AM Frazier Butt, PT  [facility]  05/28/2018  9:30 AM Lovvorn, Hewitt Shorts, CCC-SLP [facility]  05/28/2018 10:15 AM Rine, Selmer Dominion, OT [facility]  05/28/2018 11:00 AM Frazier Butt, PT [facility]  05/30/2018  8:45 AM Valentino Saxon, [place], CCC-SLP [facility]  05/30/2018  9:30 AM Frazier Butt, PT [facility]  05/30/2018 10:15 AM Delton Prairie, OT [facility]  06/04/2018 10:15 AM Lovvorn, Hewitt Shorts, CCC-SLP [facility]  06/04/2018 11:00 AM Hulda Marin, OT [facility]  06/04/2018 11:45 AM Frazier Butt, PT [facility]  06/07/2018  8:45 AM Valentino Saxon, Perry Mount, CCC-SLP [facility]  06/07/2018  9:30 AM Frazier Butt, PT [facility]  06/07/2018 10:15 AM Rine, Selmer Dominion, OT [facility]  07/02/2018 11:00 AM Dohmeier, Asencion Partridge, MD GNA-GNA None  01/28/2019  9:00 AM Vicie Mutters, PA-C GAAM-GAAIM None    HPI 62 y.o. male  presents for 3 month follow up with hypertension, hyperlipidemia, prediabetes and vitamin D.  He continues to have decreased motivation, not wanting to do anything, he is interested in meds at this time.    His blood pressure has been controlled at home, today their BP is BP: 126/80.   He does not workout. He denies chest pain, shortness of breath, dizziness.   He is on cholesterol medication and denies myalgias. His cholesterol is at goal. The cholesterol last visit was:   Lab Results  Component Value Date   CHOL 186 01/21/2018   HDL 37 (L) 01/21/2018   LDLCALC 109 (H) 01/21/2018   TRIG 303 (H) 01/21/2018   CHOLHDL  5.0 (H) 01/21/2018   He has been working on diet and exercise for Diabetes with diabetic chronic kidney disease, he is on bASA, he is not on ACE/ARB, in the AM 150's and the in eveing 170's and, he will eat twice a day, denies paresthesia of the feet, polydipsia, polyuria and visual disturbances. Last A1C was:  Lab Results  Component Value Date   HGBA1C 7.5 (H) 01/21/2018   Lab Results  Component Value Date   GFRNONAA 60 01/21/2018   Patient is on  Vitamin D supplement.  Lab Results  Component Value Date   VD25OH 28 (L) 01/21/2018     Patient reports that he has been following with neurology and is starting to do PT again for his parkinsons.  He reports that his right hand has still been very difficult.  He reports that he has been taking the mirapex and sinemet.   He is following with nephrology for possible RCC, it shrunk on its own so they will monitor it next year.   Patient is on allopurinol for gout and does report a recent flare.  Lab Results  Component Value Date   LABURIC 6.5 07/17/2017   BMI is Body mass index is 46.11 kg/m., he is working on diet and exercise. He is on a CPAP machine, uses regularly.  Wt Readings from Last 3 Encounters:  04/25/18 294 lb 6.4 oz (133.5 kg)  01/21/18 293 lb 6.4 oz (133.1 kg)  12/27/17 293 lb (132.9 kg)    Current Medications:  Current Outpatient Medications on File Prior to Visit  Medication Sig Dispense Refill  . allopurinol (ZYLOPRIM) 300 MG tablet TAKE 1 TABLET BY MOUTH ONCE DAILY 90 tablet 2  . ALPRAZolam (XANAX) 0.5 MG tablet Take 1 tablet (0.5 mg total) by mouth 3 (three) times daily. 90 tablet 2  . aspirin EC 81 MG tablet Take 81 mg by mouth at bedtime.    . carbidopa-levodopa (SINEMET IR) 25-100 MG tablet Take 1.5 tablets by mouth 3 (three) times daily. 405 tablet 2  . clotrimazole-betamethasone (LOTRISONE) cream Apply 1 application topically 2 (two) times daily. 15 g 2  . COLCRYS 0.6 MG tablet Take 1 tablet (0.6 mg total) by mouth 2 (two) times daily. 180 tablet 12  . meloxicam (MOBIC) 15 MG tablet Take one daily with food for 2 weeks, can take with tylenol, can not take with aleve, iburpofen, then as needed daily for pain 30 tablet 1  . metFORMIN (GLUCOPHAGE-XR) 500 MG 24 hr tablet TAKE 2 TABLETS BY MOUTH 2 TIMES A DAY FOR DIABETES 360 tablet 1  . methocarbamol (ROBAXIN) 500 MG tablet TAKE 1 TABLET BY MOUTH EVERY 8 HOURS AS NEEDED FOR MUSCLE SPASMS. 24 tablet 0  . pramipexole  (MIRAPEX) 0.75 MG tablet Take 1 tablet (0.75 mg total) by mouth 3 (three) times daily. 270 tablet 2  . rosuvastatin (CRESTOR) 40 MG tablet Take 1 tablet (40 mg total) by mouth daily. 90 tablet 1  . sitaGLIPtin (JANUVIA) 100 MG tablet TAKE 1 TABLET BY MOUTH ONCE DAILY FOR DIABETES 90 tablet 1  . valsartan-hydrochlorothiazide (DIOVAN-HCT) 320-25 MG tablet Take 1 tablet by mouth daily. 90 tablet 1  . VENTOLIN HFA 108 (90 Base) MCG/ACT inhaler INHALE 2 PUFFS INTO THE LUNGS EVERY 6 HOURS AS NEEDED FOR WHEEZING OR SHORTNESS OF BREATH. 54 g 3  . vitamin C (ASCORBIC ACID) 500 MG tablet Take 1 tablet (500 mg total) by mouth 3 (three) times daily. 21 tablet 0  . Vitamin D,  Ergocalciferol, (DRISDOL) 50000 units CAPS capsule Take 50,000 Units by mouth every 7 (seven) days.     No current facility-administered medications on file prior to visit.     Medical History:  Past Medical History:  Diagnosis Date  . Adult BMI 50.0-59.9 kg/sq m    52.77  . Anal fissure   . Anxiety   . Diabetes mellitus without complication (HCC)    borderline  . GERD (gastroesophageal reflux disease)    30 years ago, maybe from peptic ulcer  . H/O: gout    STABLE  . Hepatic steatosis 10/01/11  . History of kidney stones   . Hyperlipidemia   . Hypertension   . Kidney tumor    right upper kidney tumor, monitoring  . OSA on CPAP    [facility]  . Parkinson disease ([place]) 01/04/2016  . REM sleep behavior disorder 12/06/2016  . Right ureteral stone   . Ureteral calculus, right 06/12/2011  . Vitamin D deficiency   . Weakness     Allergies:  Allergies  Allergen Reactions  . Ciprofloxacin Hives  . Ceftriaxone Hives     Review of Systems:  Review of Systems  Constitutional: Negative for chills, fever and malaise/fatigue.  HENT: Negative for congestion, ear pain and sore throat.   Eyes: Negative.   Respiratory: Negative for cough, shortness of breath and wheezing.   Cardiovascular: Negative for chest pain, palpitations  and leg swelling.  Gastrointestinal: Negative for abdominal pain, blood in stool, constipation, diarrhea, heartburn and melena.  Genitourinary: Negative.   Skin: Negative.   Neurological: Negative for dizziness, sensory change, loss of consciousness and headaches.  Psychiatric/Behavioral: Positive for depression. Negative for hallucinations, memory loss, substance abuse and suicidal ideas. The patient is not nervous/anxious and does not have insomnia.     Family history- Review and unchanged  Social history- Review and unchanged  Physical Exam: BP 126/80   Pulse 76   Temp 98 F (36.7 C)   Ht 5\' 7"  (1.702 m)   Wt 294 lb 6.4 oz (133.5 kg)   SpO2 97%   BMI 46.11 kg/m  Wt Readings from Last 3 Encounters:  04/25/18 294 lb 6.4 oz (133.5 kg)  01/21/18 293 lb 6.4 oz (133.1 kg)  12/27/17 293 lb (132.9 kg)    General Appearance: Well nourished, in no apparent distress.  Eyes: PERRLA, EOMs, conjunctiva no swelling or erythema ENT/Mouth: Ear canals clear bilaterally with no erythema, swelling, discharge.  TMs normal bilaterally with no erythema, bulging, or retractions.  Oropharynx clear and moist with no exudate, swelling, or erythema.  Dentition normal.   Neck: Supple, thyroid normal. No bruits, JVD, cervical adenopathy Respiratory: Respiratory effort normal, BS equal bilaterally without rales, rhonchi, wheezing or stridor.  Cardio: RRR without murmurs, rubs or gallops. Brisk peripheral pulses without edema.  Chest: symmetric, with normal excursions Abdomen: Soft, obese, nontender, no guarding, rebound, hernias, masses, or organomegaly. Musculoskeletal: Full ROM all peripheral extremities,5/5 strength, and normal gait.  Skin: Warm, dry without rashes, lesions, ecchymosis. Neuro: A&Ox3, Cranial nerves intact, slow gait.  Overshoot of the right hand with finger to nose.  Tremor of the left hand with finger opposition>tremor of the right hand with finger opposition.    Psych: Flat affect,  Insight and Judgment appropriate.     Vicie Mutters, PA-C 9:47 AM [facility]

## 2018-04-24 ENCOUNTER — Ambulatory Visit: Payer: 59

## 2018-04-24 ENCOUNTER — Ambulatory Visit: Payer: 59 | Admitting: Occupational Therapy

## 2018-04-24 ENCOUNTER — Encounter: Payer: Self-pay | Admitting: Physical Therapy

## 2018-04-24 ENCOUNTER — Ambulatory Visit: Payer: 59 | Admitting: Physical Therapy

## 2018-04-24 DIAGNOSIS — M6281 Muscle weakness (generalized): Secondary | ICD-10-CM | POA: Diagnosis not present

## 2018-04-24 DIAGNOSIS — R29818 Other symptoms and signs involving the nervous system: Secondary | ICD-10-CM | POA: Diagnosis not present

## 2018-04-24 DIAGNOSIS — R2681 Unsteadiness on feet: Secondary | ICD-10-CM

## 2018-04-24 DIAGNOSIS — R2689 Other abnormalities of gait and mobility: Secondary | ICD-10-CM

## 2018-04-24 DIAGNOSIS — R29898 Other symptoms and signs involving the musculoskeletal system: Secondary | ICD-10-CM | POA: Diagnosis not present

## 2018-04-24 DIAGNOSIS — R293 Abnormal posture: Secondary | ICD-10-CM

## 2018-04-24 DIAGNOSIS — R471 Dysarthria and anarthria: Secondary | ICD-10-CM | POA: Diagnosis not present

## 2018-04-24 DIAGNOSIS — R278 Other lack of coordination: Secondary | ICD-10-CM

## 2018-04-24 NOTE — Patient Instructions (Addendum)
  WALKING  Walking is a great form of exercise to increase your strength, endurance and overall fitness.  A walking program can help you start slowly and gradually build endurance as you go.  Everyone's ability is different, so each person's starting point will be different.  You do not have to follow them exactly.  The are just samples. You should simply find out what's right for you and stick to that program.   In the beginning, you'll start off walking 2-3 times a day for short distances.  As you get stronger, you'll be walking further at just 1-2 times per day.  A. You Can Walk For A Certain Length Of Time Each Day    Walk 4-5 minutes 3 times per day.  Increase 1-2 minutes every 4 days (3 times per day).  Work up to 15-20 minutes (1-2 times per day).   Example:   Day 1-4 4-5 minutes 3 times per day   Day 7-8 8-10 minutes 2-3 times per day   Day 13-14 15-20 minutes 1-2 times per day

## 2018-04-24 NOTE — Therapy (Signed)
[facility] [address] [place], [place], [address] Phone: [phone]   Fax:  [phone]  Speech Language Pathology Treatment  Patient Details  Name: Todd Rice MRN: [id number] Date of Birth: February 01, 1956 Referring Provider (SLP): Neldon Newport., MD   Encounter Date: 04/24/2018  End of Session - 04/24/18 1333    Visit Number  3    Number of Visits  17    Date for SLP Re-Evaluation  06/28/18    SLP Start Time  1104    SLP Stop Time   1145    SLP Time Calculation (min)  41 min    Activity Tolerance  Patient tolerated treatment well       Past Medical History:  Diagnosis Date  . Adult BMI 50.0-59.9 kg/sq m    52.77  . Anal fissure   . Anxiety   . Diabetes mellitus without complication (HCC)    borderline  . GERD (gastroesophageal reflux disease)    30 years ago, maybe from peptic ulcer  . H/O: gout    STABLE  . Hepatic steatosis 10/01/11  . History of kidney stones   . Hyperlipidemia   . Hypertension   . Kidney tumor    right upper kidney tumor, monitoring  . OSA on CPAP    [facility]  . Parkinson disease ([place]) 01/04/2016  . REM sleep behavior disorder 12/06/2016  . Right ureteral stone   . Ureteral calculus, right 06/12/2011  . Vitamin D deficiency   . Weakness     Past Surgical History:  Procedure Laterality Date  . ANAL FISSURE REPAIR  10-12-2000  . COLONOSCOPY N/A 07/01/2012   Procedure: COLONOSCOPY;  Surgeon: Lafayette Dragon, MD;  Location: [facility];  Service: Endoscopy;  Laterality: N/A;  . CYSTOSCOPY WITH RETROGRADE PYELOGRAM, URETEROSCOPY AND STENT PLACEMENT Bilateral 02/26/2013   Procedure: CYSTOSCOPY WITH RETROGRADE PYELOGRAM, URETEROSCOPY AND STENT PLACEMENT;  Surgeon: Alexis Frock, MD;  Location: [facility];  Service: Urology;  Laterality: Bilateral;  . HOLMIUM LASER APPLICATION Bilateral 09/47/0962   Procedure: HOLMIUM LASER APPLICATION;  Surgeon: Alexis Frock, MD;  Location: [facility];   Service: Urology;  Laterality: Bilateral;  . kidney stone removal     06/2011-stent placement  . LEFT MEDIAL FEMORAL CONDYLE DEBRIDEMENT & DRILLING/ REMOVAL LOOSE BODY  09-15-2003  . NASAL SEPTUM SURGERY  1985  . STONE EXTRACTION WITH BASKET  06/12/2011   Procedure: STONE EXTRACTION WITH BASKET;  Surgeon: Claybon Jabs, MD;  Location[facility];  Service: Urology;  Laterality: Right;    There were no vitals filed for this visit.  Subjective Assessment - 04/24/18 1110    Subjective  "They wore me out."    Currently in Pain?  No/denies            ADULT SLP TREATMENT - 04/24/18 1114      General Information   Behavior/Cognition  Alert;Cooperative;Pleasant mood      Treatment Provided   Treatment provided  Cognitive-Linquistic      Cognitive-Linquistic Treatment   Treatment focused on  Dysarthria    Skilled Treatment  SLP used loud /a/ to recalibrate pt's conversational speech volume with average mid to upper 90s dB. Pt arrived talking with sub- WNL volume. With initial cues to incr loudness, pt did so with average low 70s dB over 15 minutes simpole to complex conversation. Outdoors, pt and SLP conversed in simple to complex conversation with pt maintaining 100% intelligible speech, above min to min-mod traffic  noise for most of the conversation. SLP suspects pt to be d/c'd in no more than 3-6 more sessions.      Assessment / Recommendations / Plan   Plan  Continue with current plan of care      Progression Toward Goals   Progression toward goals  Progressing toward goals         SLP Short Term Goals - 04/24/18 1337      SLP SHORT TERM GOAL #1   Title  pt will maintain loud /a/ average mid 90s dB with appropriate voicing over 4 sessions    Baseline  04-24-18    Time  3    Period  Weeks    Status  On-going      SLP SHORT TERM GOAL #2   Title  pt will produce 20/20 sentences with average 70dB over two sessions    Baseline  04-24-18    Time  3     Period  Weeks    Status  On-going      SLP SHORT TERM GOAL #3   Title  pt will demo simple conversation of 10 minutes with average 70dB over two sessions    Baseline  04-24-18    Time  3    Period  Weeks    Status  On-going       SLP Long Term Goals - 04/24/18 1338      SLP LONG TERM GOAL #1   Title  pt will maintain loud /a/ at mid 90s dB with proper voicing over 6 sessions    Time  7    Period  Weeks    Status  On-going      SLP LONG TERM GOAL #2   Title  pt will demo 10 minutes mod complex conversastion with average low 70sdB over two sessions    Time  7    Period  Weeks    Status  On-going      SLP LONG TERM GOAL #3   Title  pt will demo 15 minutes mod complex conversation with average low 70s dB outside ST room, over two sessions    Time  7    Period  Weeks    Status  On-going      SLP LONG TERM GOAL #4   Title  pt will demo 15 minutes mod complex conversation with abdominal breathing (AB) 75% of the time over two sessions    Time  7    Period  Weeks    Status  On-going       Plan - 04/24/18 1333    Clinical Impression Statement  Pt with good-excellent success today with conversational loudness in simple-compelx conversation both in and out of the ST room, once pt was cued to use louder speech and after loud /a/. Pt cont to present today with dysarthria characterized by reduced breath support for speech, and decr'd voice volume. Pt would cont to benefit from skilled ST to improve pt's communicative effectiveness as his wife reprots she needs to ask him to repeat utterances multiple times per day. Pt reports his dysphagia is much improved with compensatory technique of effortful swallow with liquids, and that he does not cough with any regularity with POs. Suspect pt will need 3-6 more visits prior to d/c.     Speech Therapy Frequency  2x / week    Duration  --   8 weeks, or 17 sessions (possible 13 sessions?)   Treatment/Interventions  Compensatory  strategies;Patient/family  education;Functional tasks;Cueing hierarchy;Environmental controls;Internal/external aids;SLP instruction and feedback;Diet toleration management by SLP;Pharyngeal strengthening exercises;Aspiration precaution training    Potential to Achieve Goals  Good    SLP Home Exercise Plan  provided today (/a/ and abdominal breathing)    Consulted and Agree with Plan of Care  Patient       Patient will benefit from skilled therapeutic intervention in order to improve the following deficits and impairments:   Dysarthria and anarthria    Problem List Patient Active Problem List   Diagnosis Date Noted  . Controlled type 2 diabetes mellitus with diabetic dermatitis, without long-term current use of insulin ([place]) 01/21/2018  . OSA treated with BiPAP 01/03/2017  . REM sleep behavior disorder 12/06/2016  . Uncontrolled secondary diabetes mellitus with stage 3 CKD (GFR 30-59) (HCC) 10/23/2016  . Medication management 10/23/2016  . Vitamin D deficiency 10/23/2016  . Parkinson disease ([place]) 01/04/2016  . Seasonal and perennial allergic rhinitis 06/21/2014  . Asthma, mild intermittent, well-controlled 06/21/2014  . Pseudomonas aeruginosa colonization 10/08/2012  . CAD (coronary artery disease) 11/15/2011  . Hypertension 11/15/2011  . Hyperlipidemia 11/15/2011  . Morbid obesity with BMI of 40.0-44.9, adult ([place]) 11/15/2011    [facility] ,MS, [place]  04/24/2018, 1:39 PM  [facility] [address] [place], [place], [address] Phone: [phone]   Fax:  [phone]   Name: BONNIE ROIG MRN: [id number] Date of Birth: 06/26/1955

## 2018-04-24 NOTE — Therapy (Signed)
[facility] [address] [place], [place], [address] Phone: [phone]   Fax:  [phone]  Occupational Therapy Treatment  Patient Details  Name: Todd Rice MRN: [id number] Date of Birth: 07-Nov-1955 Referring Provider (OT): Dr. [name]   Encounter Date: 04/24/2018  OT End of Session - 04/24/18 0934    Visit Number  3    Number of Visits  17    Date for OT Re-Evaluation  06/09/18    Authorization Type  [employer] until end of December    Authorization Time Period  changing to new insurance in January    OT Start Time  0933    OT Stop Time  1015    OT Time Calculation (min)  42 min    Activity Tolerance  Patient tolerated treatment well    Behavior During Therapy  [facility] for tasks assessed/performed       Past Medical History:  Diagnosis Date  . Adult BMI 50.0-59.9 kg/sq m    52.77  . Anal fissure   . Anxiety   . Diabetes mellitus without complication (HCC)    borderline  . GERD (gastroesophageal reflux disease)    30 years ago, maybe from peptic ulcer  . H/O: gout    STABLE  . Hepatic steatosis 10/01/11  . History of kidney stones   . Hyperlipidemia   . Hypertension   . Kidney tumor    right upper kidney tumor, monitoring  . OSA on CPAP    [facility]  . Parkinson disease ([place]) 01/04/2016  . REM sleep behavior disorder 12/06/2016  . Right ureteral stone   . Ureteral calculus, right 06/12/2011  . Vitamin D deficiency   . Weakness     Past Surgical History:  Procedure Laterality Date  . ANAL FISSURE REPAIR  10-12-2000  . COLONOSCOPY N/A 07/01/2012   Procedure: COLONOSCOPY;  Surgeon: Lafayette Dragon, MD;  Location: [facility];  Service: Endoscopy;  Laterality: N/A;  . CYSTOSCOPY WITH RETROGRADE PYELOGRAM, URETEROSCOPY AND STENT PLACEMENT Bilateral 02/26/2013   Procedure: CYSTOSCOPY WITH RETROGRADE PYELOGRAM, URETEROSCOPY AND STENT PLACEMENT;  Surgeon: Alexis Frock, MD;  Location: [facility];  Service:  Urology;  Laterality: Bilateral;  . HOLMIUM LASER APPLICATION Bilateral 77/82/4235   Procedure: HOLMIUM LASER APPLICATION;  Surgeon: Alexis Frock, MD;  Location: [facility];  Service: Urology;  Laterality: Bilateral;  . kidney stone removal     06/2011-stent placement  . LEFT MEDIAL FEMORAL CONDYLE DEBRIDEMENT & DRILLING/ REMOVAL LOOSE BODY  09-15-2003  . NASAL SEPTUM SURGERY  1985  . STONE EXTRACTION WITH BASKET  06/12/2011   Procedure: STONE EXTRACTION WITH BASKET;  Surgeon: Claybon Jabs, MD;  Location: [facility];  Service: Urology;  Laterality: Right;    There were no vitals filed for this visit.  Subjective Assessment - 04/24/18 0933    Subjective   denies pain    Pertinent History  PMH: PD, DM, CAD, HTN, hyperlipidemia, kidney tumor. Pt is retired and lives with his wife. He enjoys travel    Patient Stated Goals  improve right hand function    Currently in Pain?  No/denies              TreatmentDynamic step and reach to copy small peg design with right and left UE's, min v.c for large amplitude movments and finger extension. Multi directional  movements/ stepping following written instructions for a cognitive component, min v.c, min difficulty Writing activity to complete and rate functional tasks  that are challenging. Handwriting activity, min v.c for larger letter size and flicks before task.                OT Short Term Goals - 04/10/18 1419      OT SHORT TERM GOAL #1   Title  I with PD  specific HEP.    Time  4    Period  Weeks    Status  New      OT SHORT TERM GOAL #2   Title  I with adapted strategies for ADLs/ IADLs.    Time  4    Period  Weeks    Status  New      OT SHORT TERM GOAL #3   Title  Pt will demonstrate improved RUE fucntional use for ADLs as evidenced by increasing box/ blocks score to 42 blocks    Baseline  RUE 37, LUE 47    Time  4    Period  Weeks    Status  New        OT Long Term Goals - 04/10/18 1419       OT LONG TERM GOAL #1   Title  Pt will verbalize understanding of appropriate PD community resources.    Time  8    Period  Weeks    Status  New      OT LONG TERM GOAL #2   Title  Pt will demonstrate improved fine motor coordination for ADLSas evidenced by decreasing RUE 9 hole peg test score to 36 secs or less.    Baseline  RUE 40.78 secs, LUE 31.40 secs    Time  8    Period  Weeks    Status  New      OT LONG TERM GOAL #3   Title  Pt will perform 3 button / unbutton in 60 secs or less    Baseline  3 button/ unbutton 90.22    Time  8    Period  Weeks    Status  New            Plan - 04/24/18 1241    Clinical Impression Statement  Pt is progressing towards goals. He responds well to verbal cues for larger amplitude movement during functional activity.    Occupational Profile and client history currently impacting functional performance  PMH: PD, DM, CAD, HTN, hyperlipidemia, kidney tumor. Pt is retired and lives with his wife. He enjoys travel    Occupational performance deficits (Please refer to evaluation for details):  ADL's;Play;Leisure    Rehab Potential  Good    Current Impairments/barriers affecting progress:  tremor, bradykinesia    OT Frequency  2x / week    OT Duration  8 weeks    OT Treatment/Interventions  Self-care/ADL training;Therapeutic exercise;Patient/family education;Neuromuscular education;Paraffin;Moist Heat;Aquatic Therapy;Fluidtherapy;Energy conservation;Therapeutic activities;Balance training;Functional Mobility Training;DME and/or AE instruction;Manual Therapy;Cryotherapy;Ultrasound;Contrast Bath    Plan  dynamic step and reach, work towards goals    Consulted and Agree with Plan of Care  Patient       Patient will benefit from skilled therapeutic intervention in order to improve the following deficits and impairments:  Impaired flexibility, Decreased mobility, Decreased coordination, Decreased activity tolerance, Decreased endurance, Decreased  range of motion, Decreased strength, Impaired UE functional use, Impaired tone, Decreased balance, Difficulty walking, Impaired perceived functional ability, Obesity  Visit Diagnosis: Other lack of coordination  Other symptoms and signs involving the nervous system  Other symptoms and signs involving the musculoskeletal system  Other abnormalities  of gait and mobility  Abnormal posture    Problem List Patient Active Problem List   Diagnosis Date Noted  . Controlled type 2 diabetes mellitus with diabetic dermatitis, without long-term current use of insulin ([place]) 01/21/2018  . OSA treated with BiPAP 01/03/2017  . REM sleep behavior disorder 12/06/2016  . Uncontrolled secondary diabetes mellitus with stage 3 CKD (GFR 30-59) (HCC) 10/23/2016  . Medication management 10/23/2016  . Vitamin D deficiency 10/23/2016  . Parkinson disease ([place]) 01/04/2016  . Seasonal and perennial allergic rhinitis 06/21/2014  . Asthma, mild intermittent, well-controlled 06/21/2014  . Pseudomonas aeruginosa colonization 10/08/2012  . CAD (coronary artery disease) 11/15/2011  . Hypertension 11/15/2011  . Hyperlipidemia 11/15/2011  . Morbid obesity with BMI of 40.0-44.9, adult ([place]) 11/15/2011    Tallie Dodds 04/24/2018, 12:45 PM  [facility] [address] [place], [place], [address] Phone: [phone]   Fax:  [phone]  Name: Todd Rice MRN: [id number] Date of Birth: [birth year]

## 2018-04-24 NOTE — Therapy (Signed)
[facility] [address] [place], [place], [address] Phone: [phone]   Fax:  [phone]  Physical Therapy Treatment  Patient Details  Name: Todd Rice MRN: [id number] Date of Birth: Sep 11, 1955 Referring Provider (PT): Margette Fast   Encounter Date: 04/24/2018  PT End of Session - 04/24/18 1105    Visit Number  2    Number of Visits  10    Date for PT Re-Evaluation  06/17/18    Authorization Type  [employer]    PT Start Time  1020    PT Stop Time  1058    PT Time Calculation (min)  38 min    Activity Tolerance  Patient tolerated treatment well    Behavior During Therapy  [facility] for tasks assessed/performed       Past Medical History:  Diagnosis Date  . Adult BMI 50.0-59.9 kg/sq m    52.77  . Anal fissure   . Anxiety   . Diabetes mellitus without complication (HCC)    borderline  . GERD (gastroesophageal reflux disease)    30 years ago, maybe from peptic ulcer  . H/O: gout    STABLE  . Hepatic steatosis 10/01/11  . History of kidney stones   . Hyperlipidemia   . Hypertension   . Kidney tumor    right upper kidney tumor, monitoring  . OSA on CPAP    [facility]  . Parkinson disease ([place]) 01/04/2016  . REM sleep behavior disorder 12/06/2016  . Right ureteral stone   . Ureteral calculus, right 06/12/2011  . Vitamin D deficiency   . Weakness     Past Surgical History:  Procedure Laterality Date  . ANAL FISSURE REPAIR  10-12-2000  . COLONOSCOPY N/A 07/01/2012   Procedure: COLONOSCOPY;  Surgeon: Lafayette Dragon, MD;  [facility];  Service: Endoscopy;  Laterality: N/A;  . CYSTOSCOPY WITH RETROGRADE PYELOGRAM, URETEROSCOPY AND STENT PLACEMENT Bilateral 02/26/2013   Procedure: CYSTOSCOPY WITH RETROGRADE PYELOGRAM, URETEROSCOPY AND STENT PLACEMENT;  Surgeon: Alexis Frock, MD;  [facility];  Service: Urology;  Laterality: Bilateral;  . HOLMIUM LASER APPLICATION Bilateral 65/46/5035   Procedure: HOLMIUM LASER APPLICATION;  Surgeon: Alexis Frock, MD;  [facility];  Service: Urology;  Laterality: Bilateral;  . kidney stone removal     06/2011-stent placement  . LEFT MEDIAL FEMORAL CONDYLE DEBRIDEMENT & DRILLING/ REMOVAL LOOSE BODY  09-15-2003  . NASAL SEPTUM SURGERY  1985  . STONE EXTRACTION WITH BASKET  06/12/2011   Procedure: STONE EXTRACTION WITH BASKET;  Surgeon: Claybon Jabs, MD;  Location: [facility];  Service: Urology;  Laterality: Right;    There were no vitals filed for this visit.  Subjective Assessment - 04/24/18 1021    Subjective  No changes since last visit.  No pain.    Patient Stated Goals  Pt's goals for therapy are to get stamina back, consistently everyday.    Currently in Pain?  No/denies                     Therapeutic Exercise:  Gait for exercise, PWR! Moves for HEP    [facility] Adult PT Treatment/Exercise - 04/24/18 0001      Ambulation/Gait   Ambulation/Gait  Yes    Ambulation/Gait Assistance  7: Independent    Ambulation Distance (Feet)  600 Feet    Assistive device  None    Gait Pattern  Decreased arm swing - right;Decreased arm swing - left;Decreased step length -  right;Decreased trunk rotation    Ambulation Surface  Level;Indoor    Pre-Gait Activities  Cues for increased step length and arm swing.      Gait Comments  Discussed an provided walking program for addition to HEP.        [facility]) - 04/24/18 1024    PWR! exercises  Moves in standing    PWR! Up  x 10    PWR! Rock  x 20    PWR! Twist  x 20 reps    PWR Step  x 20 reps    Comments  PWR! Moves in standing:  pt has had these exercises in the past, able to perform with min cues for increased intensity    PWR! Sit to Stand  from mat surface:  x 10 reps to [facility]! Up in standing, then x 5 reps to [facility]! Up in standing       Neuro Re-education: Balance Exercises - 04/24/18 1033      Balance Exercises: Standing   Standing Eyes Opened  Wide  (BOA);Narrow base of support (BOS);Solid surface;Head turns;Other reps (comment);Foam/compliant surface   10 reps head turns, head nods   Standing Eyes Closed  Wide (BOA);Narrow base of support (BOS);Solid surface;Foam/compliant surface;5 reps;Head turns   Head nods   Tandem Stance  Eyes open;Foam/compliant surface;Upper extremity support 2;5 reps   Head turns, head nods, then EC 10 seconds   SLS  Eyes open;Foam/compliant surface;Solid surface;Upper extremity support 1;Intermittent upper extremity support;2 reps;10 secs        PT Education - 04/24/18 1105    Education Details  PWR! Moves added to HEP, walking program added to HEP; reminded patient of his goals to improve stamina, and that is why HEP initiated today to address PD-specific deficits and endurance    Person(s) Educated  Patient    Methods  Explanation;Demonstration;Handout    Comprehension  Verbalized understanding;Returned demonstration          PT Long Term Goals - 04/18/18 1516      PT LONG TERM GOAL #1   Title  Pt will be independent with Parkinson's specific HEP for improved balance and gait.  TARGET 06/07/18 (due to delayed scheduling with holidays)    Time  5    Period  Weeks    Status  New    Target Date  06/07/18      PT LONG TERM GOAL #2   Title  Pt will perform 5x sit<>stand in less than 11 seconds for improved transfer efficiency and safety.    Time  5    Period  Weeks    Status  New    Target Date  06/07/18      PT LONG TERM GOAL #3   Title  Pt will ambulate at least 1500 ft in 6 MWT, for improved gait endurance and efficiency.    Time  5    Period  Weeks    Status  New    Target Date  06/07/18      PT LONG TERM GOAL #4   Title  Pt will verbalize plans for continued community fitness upon d/c from PT.    Time  5    Period  Weeks    Status  New    Target Date  06/07/18            Plan - 04/24/18 1106    Clinical Impression Statement  Initiated HEP for standing PWR! Moves and walking  program, to address PD-specific  deficits and walking for endurance.  Also worked on Therapist, sports exercises, with pt tolerating compliant and solid surfaces with minimal to no UE support.  Pt will continue to benefit from skilled PT to address balance and functional strength and gait activities.    Rehab Potential  Good    PT Frequency  Other (comment)   1x/wk for 1 week, then 2x/wk for 4 weeks   PT Duration  --   total POC = 5 weeks   PT Treatment/Interventions  ADLs/Self Care Home Management;Therapeutic exercise;Therapeutic activities;Functional mobility training;Gait training;Balance training;Neuromuscular re-education;Patient/family education    PT Next Visit Plan  Review HEP for standing PWR! Moves, walking program; work on balance on compliant surfaces; aerobic/funcitonal strengthening for home:  step ups, stair negotiation, sit<>stand    Consulted and Agree with Plan of Care  Patient       Patient will benefit from skilled therapeutic intervention in order to improve the following deficits and impairments:  Abnormal gait, Decreased balance, Decreased endurance, Decreased mobility, Difficulty walking, Decreased strength, Impaired tone, Postural dysfunction  Visit Diagnosis: Unsteadiness on feet  Other abnormalities of gait and mobility     Problem List Patient Active Problem List   Diagnosis Date Noted  . Controlled type 2 diabetes mellitus with diabetic dermatitis, without long-term current use of insulin ([place]) 01/21/2018  . OSA treated with BiPAP 01/03/2017  . REM sleep behavior disorder 12/06/2016  . Uncontrolled secondary diabetes mellitus with stage 3 CKD (GFR 30-59) (HCC) 10/23/2016  . Medication management 10/23/2016  . Vitamin D deficiency 10/23/2016  . Parkinson disease ([place]) 01/04/2016  . Seasonal and perennial allergic rhinitis 06/21/2014  . Asthma, mild intermittent, well-controlled 06/21/2014  . Pseudomonas aeruginosa colonization 10/08/2012  . CAD (coronary  artery disease) 11/15/2011  . Hypertension 11/15/2011  . Hyperlipidemia 11/15/2011  . Morbid obesity with BMI of 40.0-44.9, adult ([place]) 11/15/2011    Elena Cothern W. 04/24/2018, 11:09 AM  Frazier Butt., PT  [facility] [address] [place], [place], [address] Phone: [phone]   Fax:  [phone]  Name: NICOLAOS MITRANO MRN: [id number] Date of Birth: 25-Oct-1955

## 2018-04-25 ENCOUNTER — Encounter: Payer: Self-pay | Admitting: Physician Assistant

## 2018-04-25 ENCOUNTER — Ambulatory Visit: Payer: 59 | Admitting: Physician Assistant

## 2018-04-25 VITALS — BP 126/80 | HR 76 | Temp 98.0°F | Ht 67.0 in | Wt 294.4 lb

## 2018-04-25 DIAGNOSIS — G20A1 Parkinson's disease without dyskinesia, without mention of fluctuations: Secondary | ICD-10-CM

## 2018-04-25 DIAGNOSIS — E785 Hyperlipidemia, unspecified: Secondary | ICD-10-CM | POA: Diagnosis not present

## 2018-04-25 DIAGNOSIS — E11620 Type 2 diabetes mellitus with diabetic dermatitis: Secondary | ICD-10-CM

## 2018-04-25 DIAGNOSIS — F3341 Major depressive disorder, recurrent, in partial remission: Secondary | ICD-10-CM

## 2018-04-25 DIAGNOSIS — Z79899 Other long term (current) drug therapy: Secondary | ICD-10-CM

## 2018-04-25 DIAGNOSIS — G20 Parkinson's disease: Secondary | ICD-10-CM

## 2018-04-25 DIAGNOSIS — IMO0002 Reserved for concepts with insufficient information to code with codable children: Secondary | ICD-10-CM

## 2018-04-25 DIAGNOSIS — N183 Chronic kidney disease, stage 3 (moderate): Secondary | ICD-10-CM | POA: Diagnosis not present

## 2018-04-25 DIAGNOSIS — I2510 Atherosclerotic heart disease of native coronary artery without angina pectoris: Secondary | ICD-10-CM

## 2018-04-25 DIAGNOSIS — I10 Essential (primary) hypertension: Secondary | ICD-10-CM

## 2018-04-25 DIAGNOSIS — E1322 Other specified diabetes mellitus with diabetic chronic kidney disease: Secondary | ICD-10-CM | POA: Diagnosis not present

## 2018-04-25 DIAGNOSIS — E1365 Other specified diabetes mellitus with hyperglycemia: Secondary | ICD-10-CM | POA: Diagnosis not present

## 2018-04-25 DIAGNOSIS — Z6841 Body Mass Index (BMI) 40.0 and over, adult: Secondary | ICD-10-CM

## 2018-04-25 MED ORDER — BUPROPION HCL ER (XL) 150 MG PO TB24: 150.0000 mg | ORAL_TABLET | ORAL | 2 refills | Status: DC

## 2018-04-25 MED FILL — buPROPion HCL ER (XL) 150 M: 150 | 30 days supply | Qty: 30 | Fill #0

## 2018-04-25 NOTE — Patient Instructions (Signed)
Check out  Mini habits for weight loss book  2 apps for tracking food is myfitness pal  loseit OR can take picture of your food     When it comes to diets, agreement about the perfect plan isn't easy to find, even among the experts. Experts at the [employer] developed an idea known as the Healthy Eating Plate. Just imagine a plate divided into logical, healthy portions.  The emphasis is on diet quality:  Load up on vegetables and fruits - one-half of your plate: Aim for color and variety, and remember that potatoes don't count.  Go for whole grains - one-quarter of your plate: Whole wheat, barley, wheat berries, quinoa, oats, brown rice, and foods made with them. If you want pasta, go with whole wheat pasta.  Protein power - one-quarter of your plate: Fish, chicken, beans, and nuts are all healthy, versatile protein sources. Limit red meat.  The diet, however, does go beyond the plate, offering a few other suggestions.  Use healthy plant oils, such as olive, canola, soy, corn, sunflower and peanut. Check the labels, and avoid partially hydrogenated oil, which have unhealthy trans fats.  If you're thirsty, drink water. Coffee and tea are good in moderation, but skip sugary drinks and limit milk and dairy products to one or two daily servings.  The type of carbohydrate in the diet is more important than the amount. Some sources of carbohydrates, such as vegetables, fruits, whole grains, and beans-are healthier than others.  Finally, stay active.   Google mindful eating and here are some tips and tricks below.   Rate your hunger before you eat on a scale of 1-10, try to eat closer to a 6 or higher. And if you are at below that, why are you eating? Slow down and listen to your body.

## 2018-04-26 LAB — COMPLETE METABOLIC PANEL WITH GFR
AG Ratio: 1.2 (calc) (ref 1.0–2.5)
ALT: 19 U/L (ref 9–46)
AST: 22 U/L (ref 10–35)
Albumin: 4.1 g/dL (ref 3.6–5.1)
Alkaline phosphatase (APISO): 55 U/L (ref 40–115)
BUN/Creatinine Ratio: 21 (calc) (ref 6–22)
BUN: 26 mg/dL — AB (ref 7–25)
CALCIUM: 10.7 mg/dL — AB (ref 8.6–10.3)
CHLORIDE: 98 mmol/L (ref 98–110)
CO2: 27 mmol/L (ref 20–32)
Creat: 1.25 mg/dL (ref 0.70–1.25)
GFR, EST NON AFRICAN AMERICAN: 61 mL/min/{1.73_m2} (ref >=60)
GFR, Est African American: 71 mL/min/{1.73_m2} (ref >=60)
GLUCOSE: 209 mg/dL — AB (ref 65–99)
Globulin: 3.5 g/dL (calc) (ref 1.9–3.7)
Potassium: 4.2 mmol/L (ref 3.5–5.3)
Sodium: 135 mmol/L (ref 135–146)
TOTAL PROTEIN: 7.6 g/dL (ref 6.1–8.1)
Total Bilirubin: 0.5 mg/dL (ref 0.2–1.2)

## 2018-04-26 LAB — CBC WITH DIFFERENTIAL/PLATELET
Absolute Monocytes: 659 cells/uL (ref 200–950)
BASOS PCT: 0.6 %
Basophils Absolute: 53 cells/uL (ref 0–200)
EOS PCT: 1.7 %
Eosinophils Absolute: 151 cells/uL (ref 15–500)
HEMATOCRIT: 44.6 % (ref 38.5–50.0)
HEMOGLOBIN: 15.3 g/dL (ref 13.2–17.1)
LYMPHS ABS: 2145 {cells}/uL (ref 850–3900)
MCH: 28.1 pg (ref 27.0–33.0)
MCHC: 34.3 g/dL (ref 32.0–36.0)
MCV: 82 fL (ref 80.0–100.0)
MPV: 11.6 fL (ref 7.5–12.5)
Monocytes Relative: 7.4 %
NEUTROS ABS: 5892 {cells}/uL (ref 1500–7800)
NEUTROS PCT: 66.2 %
Platelets: 190 10*3/uL (ref 140–400)
RBC: 5.44 10*6/uL (ref 4.20–5.80)
RDW: 14.3 % (ref 11.0–15.0)
Total Lymphocyte: 24.1 %
WBC: 8.9 10*3/uL (ref 3.8–10.8)

## 2018-04-26 LAB — HEMOGLOBIN A1C
Hgb A1c MFr Bld: 7.4 % of total Hgb — ABNORMAL HIGH (ref <5.7)
Mean Plasma Glucose: 166 (calc)
eAG (mmol/L): 9.2 (calc)

## 2018-04-26 LAB — LIPID PANEL
Cholesterol: 188 mg/dL (ref <200)
HDL: 34 mg/dL — AB (ref >40)
Non-HDL Cholesterol (Calc): 154 mg/dL (calc) — ABNORMAL HIGH (ref <130)
TRIGLYCERIDES: 410 mg/dL — AB (ref <150)
Total CHOL/HDL Ratio: 5.5 (calc) — ABNORMAL HIGH (ref <5.0)

## 2018-04-26 LAB — TSH: TSH: 3.62 mIU/L (ref 0.40–4.50)

## 2018-04-26 LAB — MAGNESIUM: MAGNESIUM: 1.8 mg/dL (ref 1.5–2.5)

## 2018-05-06 ENCOUNTER — Other Ambulatory Visit: Payer: Self-pay | Admitting: Internal Medicine

## 2018-05-06 MED FILL — VALSARTAN-HCTZ 320-25 MG TA: 320-25 | 30 days supply | Qty: 30 | Fill #2

## 2018-05-06 MED FILL — VENTOLIN HFA 90 MCG INHALER: 108 (90 BAS | 50 days supply | Qty: 36 | Fill #0

## 2018-05-10 ENCOUNTER — Encounter: Payer: Self-pay | Admitting: Physical Therapy

## 2018-05-10 ENCOUNTER — Ambulatory Visit: Payer: 59 | Admitting: Occupational Therapy

## 2018-05-10 ENCOUNTER — Ambulatory Visit: Payer: 59

## 2018-05-10 ENCOUNTER — Ambulatory Visit: Payer: 59 | Attending: Internal Medicine | Admitting: Physical Therapy

## 2018-05-10 DIAGNOSIS — R293 Abnormal posture: Secondary | ICD-10-CM | POA: Diagnosis present

## 2018-05-10 DIAGNOSIS — R29818 Other symptoms and signs involving the nervous system: Secondary | ICD-10-CM

## 2018-05-10 DIAGNOSIS — R29898 Other symptoms and signs involving the musculoskeletal system: Secondary | ICD-10-CM | POA: Insufficient documentation

## 2018-05-10 DIAGNOSIS — R278 Other lack of coordination: Secondary | ICD-10-CM

## 2018-05-10 DIAGNOSIS — R471 Dysarthria and anarthria: Secondary | ICD-10-CM | POA: Diagnosis present

## 2018-05-10 DIAGNOSIS — M6281 Muscle weakness (generalized): Secondary | ICD-10-CM | POA: Diagnosis present

## 2018-05-10 DIAGNOSIS — R2689 Other abnormalities of gait and mobility: Secondary | ICD-10-CM | POA: Diagnosis present

## 2018-05-10 DIAGNOSIS — R2681 Unsteadiness on feet: Secondary | ICD-10-CM | POA: Diagnosis present

## 2018-05-10 NOTE — Patient Instructions (Addendum)
FOR ALL OF THESE, STAND IN A CORNER WITH CHAIR IN FRONT.  STAND ON PILLOW OR TOWEL.  Feet Apart, Head Motion - Eyes Open    With eyes open, feet apart, move head slowly: up and down 5-10 times, then side to side Repeat _5-10___ times per session. Do _1___ sessions per day.  Copyright  VHI. All rights reserved.  Feet Apart, Head Motion - Eyes Closed    With eyes closed and feet shoulder width apart, move head slowly, up and down 5-10; side to side. Repeat _5-10___ times per session. Do __1__ sessions per day.  Copyright  VHI. All rights reserved.  Feet Together, Head Motion - Eyes Open    With eyes open, feet together, move head slowly: up and down 5-10 times, then side to side Repeat __5-10__ times per session. Do __1__ sessions per day.  Copyright  VHI. All rights reserved.  Feet Together, Head Motion - Eyes Closed    With eyes closed and feet together, move head slowly, up and down 5-10 times, then side to side Repeat _5-10___ times per session. Do __1__ sessions per day.  Copyright  VHI. All rights reserved.

## 2018-05-10 NOTE — Patient Instructions (Signed)
  You need to complete the loud "ah" twice a day. IT might not seem as important right now, but it is important for the rest of the journey!

## 2018-05-10 NOTE — Therapy (Signed)
[facility] [address] [place], [place], [address] Phone: [phone]   Fax:  [phone]  Occupational Therapy Treatment  Patient Details  Name: Todd Rice MRN: [id number] Date of Birth: 1955-08-03 Referring Provider (OT): Dr. [name]   Encounter Date: 05/10/2018  OT End of Session - 05/10/18 1440    Visit Number  4    Number of Visits  17    Date for OT Re-Evaluation  06/09/18    Authorization Type  [employer]    OT Start Time  0935    OT Stop Time  1015    OT Time Calculation (min)  40 min    Activity Tolerance  Patient tolerated treatment well    Behavior During Therapy  [facility] for tasks assessed/performed       Past Medical History:  Diagnosis Date  . Adult BMI 50.0-59.9 kg/sq m    52.77  . Anal fissure   . Anxiety   . Diabetes mellitus without complication (HCC)    borderline  . GERD (gastroesophageal reflux disease)    30 years ago, maybe from peptic ulcer  . H/O: gout    STABLE  . Hepatic steatosis 10/01/11  . History of kidney stones   . Hyperlipidemia   . Hypertension   . Kidney tumor    right upper kidney tumor, monitoring  . OSA on CPAP    [facility]  . Parkinson disease ([place]) 01/04/2016  . REM sleep behavior disorder 12/06/2016  . Right ureteral stone   . Ureteral calculus, right 06/12/2011  . Vitamin D deficiency   . Weakness     Past Surgical History:  Procedure Laterality Date  . ANAL FISSURE REPAIR  10-12-2000  . COLONOSCOPY N/A 07/01/2012   Procedure: COLONOSCOPY;  Surgeon: Lafayette Dragon, MD;  [facility];  Service: Endoscopy;  Laterality: N/A;  . CYSTOSCOPY WITH RETROGRADE PYELOGRAM, URETEROSCOPY AND STENT PLACEMENT Bilateral 02/26/2013   Procedure: CYSTOSCOPY WITH RETROGRADE PYELOGRAM, URETEROSCOPY AND STENT PLACEMENT;  Surgeon: Alexis Frock, MD;  [facility]: [facility];  Service: Urology;  Laterality: Bilateral;  . HOLMIUM LASER APPLICATION Bilateral 93/79/0240   Procedure:  HOLMIUM LASER APPLICATION;  Surgeon: Alexis Frock, MD;  [facility];  Service: Urology;  Laterality: Bilateral;  . kidney stone removal     06/2011-stent placement  . LEFT MEDIAL FEMORAL CONDYLE DEBRIDEMENT & DRILLING/ REMOVAL LOOSE BODY  09-15-2003  . NASAL SEPTUM SURGERY  1985  . STONE EXTRACTION WITH BASKET  06/12/2011   Procedure: STONE EXTRACTION WITH BASKET;  Surgeon: Claybon Jabs, MD;  Location[facility];  Service: Urology;  Laterality: Right;    There were no vitals filed for this visit.  Subjective Assessment - 05/10/18 0931    Subjective   denies pain    Pertinent History  PMH: PD, DM, CAD, HTN, hyperlipidemia, kidney tumor. Pt is retired and lives with his wife. He enjoys travel    Patient Stated Goals  improve right hand function    Currently in Pain?  No/denies            Treatment: PWR! hands basic 4, 10 reps each, min v.c for larger amplitude movements followed by fastening buttons with adapted strategies, min v.c Dynamic step and reach to copy small peg design for fine motor coordination with cogntive component, min difficulty/ v.c, 1 rest break required. Then dynamic step and reach to flip large playing cards with right and LUE's min v.c Dual tasking-Ambulating while tossing a  ball between hands, and performing category generation.                   OT Short Term Goals - 04/10/18 1419      OT SHORT TERM GOAL #1   Title  I with PD  specific HEP.    Time  4    Period  Weeks    Status  New      OT SHORT TERM GOAL #2   Title  I with adapted strategies for ADLs/ IADLs.    Time  4    Period  Weeks    Status  New      OT SHORT TERM GOAL #3   Title  Pt will demonstrate improved RUE fucntional use for ADLs as evidenced by increasing box/ blocks score to 42 blocks    Baseline  RUE 37, LUE 47    Time  4    Period  Weeks    Status  New        OT Long Term Goals - 05/10/18 1008      OT LONG TERM GOAL #1   Title  Pt  will verbalize understanding of appropriate PD community resources.    Status  On-going      OT LONG TERM GOAL #2   Title  Pt will demonstrate improved fine motor coordination for ADLSas evidenced by decreasing RUE 9 hole peg test score to 36 secs or less.    Status  On-going      OT LONG TERM GOAL #3   Title  Pt will perform 3 button / unbutton in 60 secs or less    Status  On-going            Plan - 05/10/18 1441    Clinical Impression Statement  Pt is progressing towards goals for fastening buttons. He responds well to v.c for larger amplitude movments.    Occupational Profile and client history currently impacting functional performance  PMH: PD, DM, CAD, HTN, hyperlipidemia, kidney tumor. Pt is retired and lives with his wife. He enjoys travel    Occupational performance deficits (Please refer to evaluation for details):  ADL's;Play;Leisure    Rehab Potential  Good    Current Impairments/barriers affecting progress:  tremor, bradykinesia    OT Frequency  2x / week    OT Duration  8 weeks    OT Treatment/Interventions  Self-care/ADL training;Therapeutic exercise;Patient/family education;Neuromuscular education;Paraffin;Moist Heat;Aquatic Therapy;Fluidtherapy;Energy conservation;Therapeutic activities;Balance training;Functional Mobility Training;DME and/or AE instruction;Manual Therapy;Cryotherapy;Ultrasound;Contrast Bath    Plan  dynamic step and reach, work towards goals    Consulted and Agree with Plan of Care  Patient       Patient will benefit from skilled therapeutic intervention in order to improve the following deficits and impairments:  Impaired flexibility, Decreased mobility, Decreased coordination, Decreased activity tolerance, Decreased endurance, Decreased range of motion, Decreased strength, Impaired UE functional use, Impaired tone, Decreased balance, Difficulty walking, Impaired perceived functional ability, Obesity  Visit Diagnosis: Other lack of  coordination  Other symptoms and signs involving the nervous system  Other symptoms and signs involving the musculoskeletal system  Other abnormalities of gait and mobility    Problem List Patient Active Problem List   Diagnosis Date Noted  . Controlled type 2 diabetes mellitus with diabetic dermatitis, without long-term current use of insulin ([place]) 01/21/2018  . OSA treated with BiPAP 01/03/2017  . REM sleep behavior disorder 12/06/2016  . Uncontrolled secondary diabetes mellitus with stage 3 CKD (GFR 30-59) (  [place]) 10/23/2016  . Medication management 10/23/2016  . Vitamin D deficiency 10/23/2016  . Parkinson disease ([place]) 01/04/2016  . Seasonal and perennial allergic rhinitis 06/21/2014  . Asthma, mild intermittent, well-controlled 06/21/2014  . Pseudomonas aeruginosa colonization 10/08/2012  . CAD (coronary artery disease) 11/15/2011  . Hypertension 11/15/2011  . Hyperlipidemia 11/15/2011  . Morbid obesity with BMI of 40.0-44.9, adult ([place]) 11/15/2011    RINE,KATHRYN 05/10/2018, 2:42 PM  [facility] [address] [place], [address] Phone: [phone]   Fax:  [phone]  Name: Todd Rice MRN: [id number] Date of Birth: 09-07-1955

## 2018-05-10 NOTE — Therapy (Signed)
[facility] [address] [place], [place], [address] Phone: [phone]   Fax:  [phone]  Speech Language Pathology Treatment  Patient Details  Name: Todd Rice MRN: [id number] Date of Birth: [birth year] Referring Provider (SLP): Neldon Newport., MD   Encounter Date: 05/10/2018  End of Session - 05/10/18 1101    Visit Number  4    Number of Visits  17    Date for SLP Re-Evaluation  06/28/18    SLP Start Time  16    SLP Stop Time   1059    SLP Time Calculation (min)  40 min    Activity Tolerance  Patient tolerated treatment well       Past Medical History:  Diagnosis Date  . Adult BMI 50.0-59.9 kg/sq m    52.77  . Anal fissure   . Anxiety   . Diabetes mellitus without complication (HCC)    borderline  . GERD (gastroesophageal reflux disease)    30 years ago, maybe from peptic ulcer  . H/O: gout    STABLE  . Hepatic steatosis 10/01/11  . History of kidney stones   . Hyperlipidemia   . Hypertension   . Kidney tumor    right upper kidney tumor, monitoring  . OSA on CPAP    [facility]  . Parkinson disease ([place]) 01/04/2016  . REM sleep behavior disorder 12/06/2016  . Right ureteral stone   . Ureteral calculus, right 06/12/2011  . Vitamin D deficiency   . Weakness     Past Surgical History:  Procedure Laterality Date  . ANAL FISSURE REPAIR  10-12-2000  . COLONOSCOPY N/A 07/01/2012   Procedure: COLONOSCOPY;  Surgeon: Lafayette Dragon, MD;  [facility];  Service: Endoscopy;  Laterality: N/A;  . CYSTOSCOPY WITH RETROGRADE PYELOGRAM, URETEROSCOPY AND STENT PLACEMENT Bilateral 02/26/2013   Procedure: CYSTOSCOPY WITH RETROGRADE PYELOGRAM, URETEROSCOPY AND STENT PLACEMENT;  Surgeon: Alexis Frock, MD;  [facility];  Service: Urology;  Laterality: Bilateral;  . HOLMIUM LASER APPLICATION Bilateral 87/56/4332   Procedure: HOLMIUM LASER APPLICATION;  Surgeon: Alexis Frock, MD;  [facility];   Service: Urology;  Laterality: Bilateral;  . kidney stone removal     06/2011-stent placement  . LEFT MEDIAL FEMORAL CONDYLE DEBRIDEMENT & DRILLING/ REMOVAL LOOSE BODY  09-15-2003  . NASAL SEPTUM SURGERY  1985  . STONE EXTRACTION WITH BASKET  06/12/2011   Procedure: STONE EXTRACTION WITH BASKET;  Surgeon: Claybon Jabs, MD;  Location: [facility];  Service: Urology;  Laterality: Right;    There were no vitals filed for this visit.  Subjective Assessment - 05/10/18 1035    Subjective  "Those girls killed me today."    Patient is accompained by:  Family member    Currently in Pain?  No/denies            ADULT SLP TREATMENT - 05/10/18 1036      General Information   Behavior/Cognition  Alert;Cooperative;Pleasant mood      Treatment Provided   Treatment provided  Cognitive-Linquistic      Cognitive-Linquistic Treatment   Treatment focused on  Dysarthria    Skilled Treatment  Pt maintained low 70s dB with rare intermittent lower volume speech in upper 60s, for 15 minutes.  Loud /a/ was completed with pt at average upper 90s dB. AFterwards, conversation for 21 minutes was lower 70s dB average. SLP ascertained pt only performing loud /a/ sets once/day - SLP reiterated BID completion and explained  rationale.       Assessment / Recommendations / Plan   Plan  Continue with current plan of care      Progression Toward Goals   Progression toward goals  Progressing toward goals       SLP Education - 05/10/18 1101    Education Details  BID loud /a/    Person(s) Educated  Patient;Spouse    Methods  Explanation    Comprehension  Verbalized understanding       SLP Short Term Goals - 05/10/18 1102      SLP SHORT TERM GOAL #1   Title  pt will maintain loud /a/ average mid 90s dB with appropriate voicing over 4 sessions    Baseline  04-24-18, 05-10-18    Time  2    Period  Weeks    Status  On-going      SLP SHORT TERM GOAL #2   Title  pt will produce 20/20  sentences with average 70dB over two sessions    Baseline  04-24-18, 05-10-18    Time  2    Period  Weeks    Status  On-going      SLP SHORT TERM GOAL #3   Title  pt will demo simple conversation of 10 minutes with average 70dB over two sessions    Baseline  04-24-18, 05-10-18    Time  2    Period  Weeks    Status  On-going       SLP Long Term Goals - 05/10/18 1103      SLP LONG TERM GOAL #1   Title  pt will maintain loud /a/ at mid 90s dB with proper voicing over 6 sessions    Baseline  05-10-18    Time  6    Period  Weeks    Status  On-going      SLP LONG TERM GOAL #2   Title  pt will demo 10 minutes mod complex conversastion with average low 70sdB over two sessions    Baseline  05-10-18    Time  6    Period  Weeks    Status  On-going      SLP LONG TERM GOAL #3   Title  pt will demo 15 minutes mod complex conversation with average low 70s dB outside ST room, over two sessions    Time  6    Period  Weeks    Status  On-going      SLP LONG TERM GOAL #4   Title  pt will demo 15 minutes mod complex conversation with abdominal breathing (AB) 75% of the time over two sessions    Baseline  05-10-18    Time  6    Period  Weeks    Status  On-going       Plan - 05/10/18 1101    Clinical Impression Statement  Pt with good-excellent success today with conversational loudness in simple-compelx conversation, again, both in and out of the [place] room. Pt cont to present today with mild intermittent dysarthria characterized by reduced breath support for speech, and decr'd voice volume. Pt would cont to benefit from skilled ST to improve pt's communicative effectiveness as his wife reprots she needs to ask him to repeat utterances multiple times per day. Pt reports his dysphagia is much improved with compensatory technique of effortful swallow with liquids, and that he does not cough with any regularity with POs. Suspect pt will need 2-4 more visits prior to d/c.  Speech Therapy Frequency  2x /  week    Duration  --   8 weeks, or 17 sessions (possible 13 sessions?)   Treatment/Interventions  Compensatory strategies;Patient/family education;Functional tasks;Cueing hierarchy;Environmental controls;Internal/external aids;SLP instruction and feedback;Diet toleration management by SLP;Pharyngeal strengthening exercises;Aspiration precaution training    Potential to Achieve Goals  Good    SLP Home Exercise Plan  provided today (/a/ and abdominal breathing)    Consulted and Agree with Plan of Care  Patient       Patient will benefit from skilled therapeutic intervention in order to improve the following deficits and impairments:   Dysarthria and anarthria    Problem List Patient Active Problem List   Diagnosis Date Noted  . Controlled type 2 diabetes mellitus with diabetic dermatitis, without long-term current use of insulin ([place]) 01/21/2018  . OSA treated with BiPAP 01/03/2017  . REM sleep behavior disorder 12/06/2016  . Uncontrolled secondary diabetes mellitus with stage 3 CKD (GFR 30-59) ([facility]) 10/23/2016  . Medication management 10/23/2016  . Vitamin D deficiency 10/23/2016  . Parkinson disease ([place]) 01/04/2016  . Seasonal and perennial allergic rhinitis 06/21/2014  . Asthma, mild intermittent, well-controlled 06/21/2014  . Pseudomonas aeruginosa colonization 10/08/2012  . CAD (coronary artery disease) 11/15/2011  . Hypertension 11/15/2011  . Hyperlipidemia 11/15/2011  . Morbid obesity with BMI of 40.0-44.9, adult ([place]) 11/15/2011    [facility] ,[place], [place]  05/10/2018, 11:03 AM  [facility] [address] [place], [place], [address] Phone: [phone]   Fax:  [phone]   Name: Todd Rice MRN: [id number] Date of Birth: June 28, 1955

## 2018-05-10 NOTE — Therapy (Signed)
[facility] [address] [place], [place], [address] Phone: [phone]   Fax:  [phone]  Physical Therapy Treatment  Patient Details  Name: Todd Rice MRN: [id number] Date of Birth: 1955-08-08 Referring Provider (PT): Margette Fast   Encounter Date: 05/10/2018  PT End of Session - 05/10/18 0931    Visit Number  3    Number of Visits  10    Date for PT Re-Evaluation  06/17/18    Authorization Type  [employer]R    PT Start Time  0840    PT Stop Time  0927    PT Time Calculation (min)  47 min    Activity Tolerance  Patient tolerated treatment well    Behavior During Therapy  [facility] for tasks assessed/performed       Past Medical History:  Diagnosis Date  . Adult BMI 50.0-59.9 kg/sq m    52.77  . Anal fissure   . Anxiety   . Diabetes mellitus without complication (HCC)    borderline  . GERD (gastroesophageal reflux disease)    30 years ago, maybe from peptic ulcer  . H/O: gout    STABLE  . Hepatic steatosis 10/01/11  . History of kidney stones   . Hyperlipidemia   . Hypertension   . Kidney tumor    right upper kidney tumor, monitoring  . OSA on CPAP    [facility]  . Parkinson disease ([place]) 01/04/2016  . REM sleep behavior disorder 12/06/2016  . Right ureteral stone   . Ureteral calculus, right 06/12/2011  . Vitamin D deficiency   . Weakness     Past Surgical History:  Procedure Laterality Date  . ANAL FISSURE REPAIR  10-12-2000  . COLONOSCOPY N/A 07/01/2012   Procedure: COLONOSCOPY;  Surgeon: Lafayette Dragon, MD;  [facility];  Service: Endoscopy;  Laterality: N/A;  . CYSTOSCOPY WITH RETROGRADE PYELOGRAM, URETEROSCOPY AND STENT PLACEMENT Bilateral 02/26/2013   Procedure: CYSTOSCOPY WITH RETROGRADE PYELOGRAM, URETEROSCOPY AND STENT PLACEMENT;  Surgeon: Alexis Frock, MD;  [facility];  Service: Urology;  Laterality: Bilateral;  . HOLMIUM LASER APPLICATION Bilateral 18/84/1660   Procedure:  HOLMIUM LASER APPLICATION;  Surgeon: Alexis Frock, MD;  [facility];  Service: Urology;  Laterality: Bilateral;  . kidney stone removal     06/2011-stent placement  . LEFT MEDIAL FEMORAL CONDYLE DEBRIDEMENT & DRILLING/ REMOVAL LOOSE BODY  09-15-2003  . NASAL SEPTUM SURGERY  1985  . STONE EXTRACTION WITH BASKET  06/12/2011   Procedure: STONE EXTRACTION WITH BASKET;  Surgeon: Claybon Jabs, MD;  [facility];  Service: Urology;  Laterality: Right;    There were no vitals filed for this visit.  Subjective Assessment - 05/10/18 0843    Subjective  No changes, no pain.  Done my exercises some.  Reports walking 30 minutes at a time.    Patient Stated Goals  Pt's goals for therapy are to get stamina back, consistently everyday.    Currently in Pain?  No/denies                          [facility]) - 05/10/18 0845    PWR! exercises  Moves in standing    PWR! Up  x 20    PWR! Rock  x 20    PWR! Twist  x 20    PWR Step  x 20 side; then x 10 forward, then 10 reps back step and  weightshift    Basic 4 Flow  x 5 reps PWR! Moves Flow in standing    Comments  PWR! Moves in standing-review of HEP       Balance Exercises - 05/10/18 0859      Balance Exercises: Standing   Standing Eyes Opened  Wide (BOA);Narrow base of support (BOS);Head turns;Foam/compliant surface;5 reps   Head nods   Standing Eyes Closed  Wide (BOA);Narrow base of support (BOS);Foam/compliant surface;5 reps;Head turns   Head nods   Tandem Stance  Eyes open;Foam/compliant surface;Upper extremity support 2;5 reps   HEad turns, nods; then EC x 10 sec (partial tandem)   SLS  Eyes open;Foam/compliant surface;Solid surface;Upper extremity support 1;Intermittent upper extremity support;10 secs;3 reps    Heel Raises Limitations  10 reps, 3 seconds    Toe Raise Limitations  10 reps, 3 seconds    Sit to Stand Time  Sit to stand 10 reps with weighted ball lift, then sit to stand x 10 reps  with ball/UE trunk rotation;     Other Standing Exercises  Step up-up/down-down x 15 reps each leg at 6" step bilateral UE support.  Heel walking, toe walking, 3 reps each, 10 ft        PT Education - 05/10/18 0930    Education Details  Added standing corner balance exercises to HEP; verbally discussed option for PWR! Moves Flow in standing as part of HEP    Person(s) Educated  Patient;Spouse    Methods  Explanation;Demonstration;Handout    Comprehension  Verbalized understanding;Returned demonstration          PT Long Term Goals - 04/18/18 1516      PT LONG TERM GOAL #1   Title  Pt will be independent with Parkinson's specific HEP for improved balance and gait.  TARGET 06/07/18 (due to delayed scheduling with holidays)    Time  5    Period  Weeks    Status  New    Target Date  06/07/18      PT LONG TERM GOAL #2   Title  Pt will perform 5x sit<>stand in less than 11 seconds for improved transfer efficiency and safety.    Time  5    Period  Weeks    Status  New    Target Date  06/07/18      PT LONG TERM GOAL #3   Title  Pt will ambulate at least 1500 ft in 6 MWT, for improved gait endurance and efficiency.    Time  5    Period  Weeks    Status  New    Target Date  06/07/18      PT LONG TERM GOAL #4   Title  Pt will verbalize plans for continued community fitness upon d/c from PT.    Time  5    Period  Weeks    Status  New    Target Date  06/07/18            Plan - 05/10/18 1200    Clinical Impression Statement  Reviewed PWR! Moves in standing today, with pt return demo understanding, with min cues for increased amplitude of movement patterns.  Pt reports having increased walking time to 30 minutes (but PT forgot to ask how many days per week he is doing this).  Also addressed compliant surface balance exercises and functional strengthening.  Pt requires multiple seated rest breaks during exercise.  Pt will continue to benefit from skilled PT to address balance,  strength, and gait.    Rehab Potential  Good    PT Frequency  Other (comment)   1x/wk for 1 week, then 2x/wk for 4 weeks   PT Duration  --   total POC = 5 weeks   PT Treatment/Interventions  ADLs/Self Care Home Management;Therapeutic exercise;Therapeutic activities;Functional mobility training;Gait training;Balance training;Neuromuscular re-education;Patient/family education    PT Next Visit Plan  Review HEP walking program and HEP for balance on compliant surfaces; aerobic/funcitonal strengthening for home:  step ups, stair negotiation, sit<>stand    Consulted and Agree with Plan of Care  Patient       Patient will benefit from skilled therapeutic intervention in order to improve the following deficits and impairments:  Abnormal gait, Decreased balance, Decreased endurance, Decreased mobility, Difficulty walking, Decreased strength, Impaired tone, Postural dysfunction  Visit Diagnosis: Muscle weakness (generalized)  Unsteadiness on feet     Problem List Patient Active Problem List   Diagnosis Date Noted  . Controlled type 2 diabetes mellitus with diabetic dermatitis, without long-term current use of insulin ([place]) 01/21/2018  . OSA treated with BiPAP 01/03/2017  . REM sleep behavior disorder 12/06/2016  . Uncontrolled secondary diabetes mellitus with stage 3 CKD (GFR 30-59) ([facility]) 10/23/2016  . Medication management 10/23/2016  . Vitamin D deficiency 10/23/2016  . Parkinson disease ([place]) 01/04/2016  . Seasonal and perennial allergic rhinitis 06/21/2014  . Asthma, mild intermittent, well-controlled 06/21/2014  . Pseudomonas aeruginosa colonization 10/08/2012  . CAD (coronary artery disease) 11/15/2011  . Hypertension 11/15/2011  . Hyperlipidemia 11/15/2011  . Morbid obesity with BMI of 40.0-44.9, adult ([place]) 11/15/2011    Michaell Grider W. 05/10/2018, 12:12 PM  Frazier Butt., PT   [facility] [address] [place], [place], [address] Phone: [phone]   Fax:  [phone]  Name: Todd Rice MRN: [id number] Date of Birth: [birth year]

## 2018-05-13 MED FILL — JANUVIA 100 MG TABLET: 100 | 60 days supply | Qty: 60 | Fill #0

## 2018-05-14 ENCOUNTER — Ambulatory Visit: Payer: 59

## 2018-05-14 ENCOUNTER — Ambulatory Visit: Payer: 59 | Admitting: Physical Therapy

## 2018-05-14 ENCOUNTER — Encounter: Payer: Self-pay | Admitting: Physical Therapy

## 2018-05-14 ENCOUNTER — Ambulatory Visit: Payer: 59 | Admitting: Occupational Therapy

## 2018-05-14 ENCOUNTER — Encounter: Payer: Self-pay | Admitting: Occupational Therapy

## 2018-05-14 DIAGNOSIS — R2689 Other abnormalities of gait and mobility: Secondary | ICD-10-CM

## 2018-05-14 DIAGNOSIS — M6281 Muscle weakness (generalized): Secondary | ICD-10-CM

## 2018-05-14 DIAGNOSIS — R471 Dysarthria and anarthria: Secondary | ICD-10-CM

## 2018-05-14 DIAGNOSIS — R278 Other lack of coordination: Secondary | ICD-10-CM

## 2018-05-14 DIAGNOSIS — R29818 Other symptoms and signs involving the nervous system: Secondary | ICD-10-CM

## 2018-05-14 DIAGNOSIS — R293 Abnormal posture: Secondary | ICD-10-CM

## 2018-05-14 DIAGNOSIS — R29898 Other symptoms and signs involving the musculoskeletal system: Secondary | ICD-10-CM

## 2018-05-14 DIAGNOSIS — R2681 Unsteadiness on feet: Secondary | ICD-10-CM

## 2018-05-14 NOTE — Therapy (Signed)
[facility] [address] [place], [place], [address] Phone: [phone]   Fax:  [phone]  Occupational Therapy Treatment  Patient Details  Name: Todd Rice MRN: [id number] Date of Birth: March 20, 1956 Referring Provider (OT): Dr. [name]   Encounter Date: 05/14/2018  OT End of Session - 05/14/18 1023    Visit Number  5    Number of Visits  17    Date for OT Re-Evaluation  06/09/18    Authorization Type  [employer]    Authorization Time Period  changing to new insurance in January    OT Start Time  1021    OT Stop Time  1102    OT Time Calculation (min)  41 min    Activity Tolerance  Patient tolerated treatment well    Behavior During Therapy  [facility] for tasks assessed/performed       Past Medical History:  Diagnosis Date  . Adult BMI 50.0-59.9 kg/sq m    52.77  . Anal fissure   . Anxiety   . Diabetes mellitus without complication (HCC)    borderline  . GERD (gastroesophageal reflux disease)    30 years ago, maybe from peptic ulcer  . H/O: gout    STABLE  . Hepatic steatosis 10/01/11  . History of kidney stones   . Hyperlipidemia   . Hypertension   . Kidney tumor    right upper kidney tumor, monitoring  . OSA on CPAP    [facility]  . Parkinson disease ([place]) 01/04/2016  . REM sleep behavior disorder 12/06/2016  . Right ureteral stone   . Ureteral calculus, right 06/12/2011  . Vitamin D deficiency   . Weakness     Past Surgical History:  Procedure Laterality Date  . ANAL FISSURE REPAIR  10-12-2000  . COLONOSCOPY N/A 07/01/2012   Procedure: COLONOSCOPY;  Surgeon: Lafayette Dragon, MD;  [facility];  Service: Endoscopy;  Laterality: N/A;  . CYSTOSCOPY WITH RETROGRADE PYELOGRAM, URETEROSCOPY AND STENT PLACEMENT Bilateral 02/26/2013   Procedure: CYSTOSCOPY WITH RETROGRADE PYELOGRAM, URETEROSCOPY AND STENT PLACEMENT;  Surgeon: Alexis Frock, MD;  [facility];  Service: Urology;  Laterality:  Bilateral;  . HOLMIUM LASER APPLICATION Bilateral 69/62/9528   Procedure: HOLMIUM LASER APPLICATION;  Surgeon: Alexis Frock, MD;  [facility];  Service: Urology;  Laterality: Bilateral;  . kidney stone removal     06/2011-stent placement  . LEFT MEDIAL FEMORAL CONDYLE DEBRIDEMENT & DRILLING/ REMOVAL LOOSE BODY  09-15-2003  . NASAL SEPTUM SURGERY  1985  . STONE EXTRACTION WITH BASKET  06/12/2011   Procedure: STONE EXTRACTION WITH BASKET;  Surgeon: Claybon Jabs, MD;  Location: [facility];  Service: Urology;  Laterality: Right;    There were no vitals filed for this visit.  Subjective Assessment - 05/14/18 1023    Subjective   nothing new    Pertinent History  PMH: PD, DM, CAD, HTN, hyperlipidemia, kidney tumor. Pt is retired and lives with his wife. He enjoys travel    Patient Stated Goals  improve right hand function    Currently in Pain?  No/denies       Ambulating with tossing scarf between hands with min cueing for large amplitude movements with UEs and with PWR! Hands.    Placing grooved pegs in pegboard with each hand with min-mod cueing for incr in-hand manipulation, PWR! Hands prior to picking up peg, and deliberate movements.   Standing, tossing scarves with each UE and each finger/thumb for  PWR! Hand step incorporating trunk rotation and weight shift with min cueing.  Standing, functional reaching in diagonal patterns floor/low range>overhead to grasp/release cylinder objects with each UE with min cueing for PWR! Hands/large amplitude, particularly with RUE.  Sitting, stacking/unstacking blocks with each hand simultaneously with min cueing/focus For bilateral coordination/timing, PWR! Hands, deliberate large amplitude movements.      OT Short Term Goals - 04/10/18 1419      OT SHORT TERM GOAL #1   Title  I with PD  specific HEP.    Time  4    Period  Weeks    Status  New      OT SHORT TERM GOAL #2   Title  I with adapted strategies for ADLs/  IADLs.    Time  4    Period  Weeks    Status  New      OT SHORT TERM GOAL #3   Title  Pt will demonstrate improved RUE fucntional use for ADLs as evidenced by increasing box/ blocks score to 42 blocks    Baseline  RUE 37, LUE 47    Time  4    Period  Weeks    Status  New        OT Long Term Goals - 05/10/18 1008      OT LONG TERM GOAL #1   Title  Pt will verbalize understanding of appropriate PD community resources.    Status  On-going      OT LONG TERM GOAL #2   Title  Pt will demonstrate improved fine motor coordination for ADLSas evidenced by decreasing RUE 9 hole peg test score to 36 secs or less.    Status  On-going      OT LONG TERM GOAL #3   Title  Pt will perform 3 button / unbutton in 60 secs or less    Status  On-going            Plan - 05/14/18 1103    Clinical Impression Statement  Pt is progressing towards goals with improved movement amplitude for functional tasks and less rigidity.  He responds well to cues for larger amplitude movements.      Occupational Profile and client history currently impacting functional performance  PMH: PD, DM, CAD, HTN, hyperlipidemia, kidney tumor. Pt is retired and lives with his wife. He enjoys travel    Occupational performance deficits (Please refer to evaluation for details):  ADL's;Play;Leisure    Rehab Potential  Good    Current Impairments/barriers affecting progress:  tremor, bradykinesia    OT Frequency  2x / week    OT Duration  8 weeks    OT Treatment/Interventions  Self-care/ADL training;Therapeutic exercise;Patient/family education;Neuromuscular education;Paraffin;Moist Heat;Aquatic Therapy;Fluidtherapy;Energy conservation;Therapeutic activities;Balance training;Functional Mobility Training;DME and/or AE instruction;Manual Therapy;Cryotherapy;Ultrasound;Contrast Bath    Plan  dynamic step and reach, coordination/functional use of RUE with large amplitude movements    Consulted and Agree with Plan of Care  Patient        Patient will benefit from skilled therapeutic intervention in order to improve the following deficits and impairments:  Impaired flexibility, Decreased mobility, Decreased coordination, Decreased activity tolerance, Decreased endurance, Decreased range of motion, Decreased strength, Impaired UE functional use, Impaired tone, Decreased balance, Difficulty walking, Impaired perceived functional ability, Obesity  Visit Diagnosis: Other lack of coordination  Other symptoms and signs involving the nervous system  Other symptoms and signs involving the musculoskeletal system  Other abnormalities of gait and mobility  Muscle weakness (generalized)  Unsteadiness on feet  Abnormal posture    Problem List Patient Active Problem List   Diagnosis Date Noted  . Controlled type 2 diabetes mellitus with diabetic dermatitis, without long-term current use of insulin ([place]) 01/21/2018  . OSA treated with BiPAP 01/03/2017  . REM sleep behavior disorder 12/06/2016  . Uncontrolled secondary diabetes mellitus with stage 3 CKD (GFR 30-59) (HCC) 10/23/2016  . Medication management 10/23/2016  . Vitamin D deficiency 10/23/2016  . Parkinson disease ([place]) 01/04/2016  . Seasonal and perennial allergic rhinitis 06/21/2014  . Asthma, mild intermittent, well-controlled 06/21/2014  . Pseudomonas aeruginosa colonization 10/08/2012  . CAD (coronary artery disease) 11/15/2011  . Hypertension 11/15/2011  . Hyperlipidemia 11/15/2011  . Morbid obesity with BMI of 40.0-44.9, adult ([place]) 11/15/2011    [facility] 05/14/2018, 11:23 AM  [place] [address] [place], [place], [address] Phone: [phone]   Fax:  [phone]  Name: WALLER MARCUSSEN MRN: [id number] Date of Birth: 1955-12-28   Vianne Bulls, OTR/L [facility] [address] [place], [place]  [address] [phone]  phone [phone] 05/14/18 11:23 AM

## 2018-05-14 NOTE — Therapy (Signed)
[facility] [address] [place], [place], [address] Phone: [phone]   Fax:  [phone]  Speech Language Pathology Treatment  Patient Details  Name: Todd Rice MRN: [id number] Date of Birth: [birth year] Referring Provider (SLP): Neldon Newport., MD   Encounter Date: 05/14/2018  End of Session - 05/14/18 1015    Visit Number  5    Number of Visits  17    Date for SLP Re-Evaluation  06/28/18    SLP Start Time  0934    SLP Stop Time   1017    SLP Time Calculation (min)  43 min    Activity Tolerance  Patient tolerated treatment well       Past Medical History:  Diagnosis Date  . Adult BMI 50.0-59.9 kg/sq m    52.77  . Anal fissure   . Anxiety   . Diabetes mellitus without complication (HCC)    borderline  . GERD (gastroesophageal reflux disease)    30 years ago, maybe from peptic ulcer  . H/O: gout    STABLE  . Hepatic steatosis 10/01/11  . History of kidney stones   . Hyperlipidemia   . Hypertension   . Kidney tumor    right upper kidney tumor, monitoring  . OSA on CPAP    [facility]  . Parkinson disease ([place]) 01/04/2016  . REM sleep behavior disorder 12/06/2016  . Right ureteral stone   . Ureteral calculus, right 06/12/2011  . Vitamin D deficiency   . Weakness     Past Surgical History:  Procedure Laterality Date  . ANAL FISSURE REPAIR  10-12-2000  . COLONOSCOPY N/A 07/01/2012   Procedure: COLONOSCOPY;  Surgeon: Lafayette Dragon, MD;  [facility];  Service: Endoscopy;  Laterality: N/A;  . CYSTOSCOPY WITH RETROGRADE PYELOGRAM, URETEROSCOPY AND STENT PLACEMENT Bilateral 02/26/2013   Procedure: CYSTOSCOPY WITH RETROGRADE PYELOGRAM, URETEROSCOPY AND STENT PLACEMENT;  Surgeon: Alexis Frock, MD;  [facility];  Service: Urology;  Laterality: Bilateral;  . HOLMIUM LASER APPLICATION Bilateral 76/73/4193   Procedure: HOLMIUM LASER APPLICATION;  Surgeon: Alexis Frock, MD;  [facility];   Service: Urology;  Laterality: Bilateral;  . kidney stone removal     06/2011-stent placement  . LEFT MEDIAL FEMORAL CONDYLE DEBRIDEMENT & DRILLING/ REMOVAL LOOSE BODY  09-15-2003  . NASAL SEPTUM SURGERY  1985  . STONE EXTRACTION WITH BASKET  06/12/2011   Procedure: STONE EXTRACTION WITH BASKET;  Surgeon: Claybon Jabs, MD;  Location: [facility];  Service: Urology;  Laterality: Right;    There were no vitals filed for this visit.  Subjective Assessment - 05/14/18 0942    Subjective  Pt and wife agree pt's speech at home has been at adequate volumes since last session.     Patient is accompained by:  Family member   wife   Currently in Pain?  No/denies            ADULT SLP TREATMENT - 05/14/18 0945      General Information   Behavior/Cognition  Alert;Cooperative;Pleasant mood      Treatment Provided   Treatment provided  Cognitive-Linquistic      Cognitive-Linquistic Treatment   Treatment focused on  Dysarthria    Skilled Treatment  "Now I take enough breath to finish my sentences." SLP engaged pt in conversation for 35 minutes in multiple conversational complexities with pt maintaining speech loudness in low 70s dB. Likely d/c in 1-2 visits.      Assessment /  Recommendations / Plan   Plan  Continue with current plan of care      Progression Toward Goals   Progression toward goals  Progressing toward goals         SLP Short Term Goals - 05/14/18 1741      SLP SHORT TERM GOAL #1   Title  pt will maintain loud /a/ average mid 90s dB with appropriate voicing over 4 sessions    Baseline  04-24-18, 05-10-18, 05-14-18    Time  1    Period  Weeks    Status  On-going      SLP SHORT TERM GOAL #2   Title  pt will produce 20/20 sentences with average 70dB over two sessions    Status  Achieved      SLP SHORT TERM GOAL #3   Title  pt will demo simple conversation of 10 minutes with average 70dB over two sessions    Status  Achieved       SLP Long Term  Goals - 05/14/18 1742      SLP LONG TERM GOAL #1   Title  pt will maintain loud /a/ at mid 90s dB with proper voicing over 6 sessions    Baseline  05-10-18, 05-14-18    Time  5    Period  Weeks    Status  On-going      SLP LONG TERM GOAL #2   Title  pt will demo 10 minutes mod complex conversastion with average low 70sdB over two sessions    Status  Achieved      SLP LONG TERM GOAL #3   Title  pt will demo 15 minutes mod complex conversation with average low 70s dB outside ST room, over two sessions    Time  5    Period  Weeks    Status  On-going      SLP LONG TERM GOAL #4   Title  pt will demo 15 minutes mod complex conversation with abdominal breathing (AB) 75% of the time over two sessions    Status  Achieved       Plan - 05/14/18 1739    Clinical Impression Statement  Pt with excellent success consistent today with conversational loudness in simple-compelx conversation in the [facility] room. Pt reports mild intermittent dysarthria, mostly when moderately fatigued, characterized by reduced breath support for speech, and decr'd voice volume. Pt would cont to benefit from skilled ST to improve pt's communicative effectiveness, likely 1-2 more sessions. Wife and pt both reports she has not asked him to repeat since last ST session.     Speech Therapy Frequency  2x / week    Duration  --   8 weeks, or 17 sessions (possible 13 sessions?)   Treatment/Interventions  Compensatory strategies;Patient/family education;Functional tasks;Cueing hierarchy;Environmental controls;Internal/external aids;SLP instruction and feedback;Diet toleration management by SLP;Pharyngeal strengthening exercises;Aspiration precaution training    Potential to Achieve Goals  Good    SLP Home Exercise Plan  provided today (/a/ and abdominal breathing)    Consulted and Agree with Plan of Care  Patient       Patient will benefit from skilled therapeutic intervention in order to improve the following deficits and  impairments:   Dysarthria and anarthria    Problem List Patient Active Problem List   Diagnosis Date Noted  . Controlled type 2 diabetes mellitus with diabetic dermatitis, without long-term current use of insulin ([place]) 01/21/2018  . OSA treated with BiPAP 01/03/2017  . REM sleep behavior  disorder 12/06/2016  . Uncontrolled secondary diabetes mellitus with stage 3 CKD (GFR 30-59) (HCC) 10/23/2016  . Medication management 10/23/2016  . Vitamin D deficiency 10/23/2016  . Parkinson disease ([place]) 01/04/2016  . Seasonal and perennial allergic rhinitis 06/21/2014  . Asthma, mild intermittent, well-controlled 06/21/2014  . Pseudomonas aeruginosa colonization 10/08/2012  . CAD (coronary artery disease) 11/15/2011  . Hypertension 11/15/2011  . Hyperlipidemia 11/15/2011  . Morbid obesity with BMI of 40.0-44.9, adult ([place]) 11/15/2011    [facility] ,[place], [place]  05/14/2018, 5:43 PM  [facility] [address] [place], [place], [address] Phone: [phone]   Fax:  [phone]   Name: Todd Rice MRN: [id number] Date of Birth: Jan 28, 1956

## 2018-05-14 NOTE — Therapy (Signed)
[facility] [address] [place], [place], [address] Phone: [phone]   Fax:  [phone]  Physical Therapy Treatment  Patient Details  Name: Todd Rice MRN: [id number] Date of Birth: [birth year] Referring Provider (PT): Margette Fast   Encounter Date: 05/14/2018  PT End of Session - 05/14/18 1935    Visit Number  4    Number of Visits  10    Date for PT Re-Evaluation  06/17/18    Authorization Type  UMR    PT Start Time  1104    PT Stop Time  1144    PT Time Calculation (min)  40 min    Activity Tolerance  Patient tolerated treatment well    Behavior During Therapy  [facility] for tasks assessed/performed       Past Medical History:  Diagnosis Date  . Adult BMI 50.0-59.9 kg/sq m    52.77  . Anal fissure   . Anxiety   . Diabetes mellitus without complication (HCC)    borderline  . GERD (gastroesophageal reflux disease)    30 years ago, maybe from peptic ulcer  . H/O: gout    STABLE  . Hepatic steatosis 10/01/11  . History of kidney stones   . Hyperlipidemia   . Hypertension   . Kidney tumor    right upper kidney tumor, monitoring  . OSA on CPAP    [facility]  . Parkinson disease ([place]) 01/04/2016  . REM sleep behavior disorder 12/06/2016  . Right ureteral stone   . Ureteral calculus, right 06/12/2011  . Vitamin D deficiency   . Weakness     Past Surgical History:  Procedure Laterality Date  . ANAL FISSURE REPAIR  10-12-2000  . COLONOSCOPY N/A 07/01/2012   Procedure: COLONOSCOPY;  Surgeon: Lafayette Dragon, MD;  [facility];  Service: Endoscopy;  Laterality: N/A;  . CYSTOSCOPY WITH RETROGRADE PYELOGRAM, URETEROSCOPY AND STENT PLACEMENT Bilateral 02/26/2013   Procedure: CYSTOSCOPY WITH RETROGRADE PYELOGRAM, URETEROSCOPY AND STENT PLACEMENT;  Surgeon: Alexis Frock, MD;  [facility];  Service: Urology;  Laterality: Bilateral;  . HOLMIUM LASER APPLICATION Bilateral 81/05/7508   Procedure:  HOLMIUM LASER APPLICATION;  Surgeon: Alexis Frock, MD;  [facility];  Service: Urology;  Laterality: Bilateral;  . kidney stone removal     06/2011-stent placement  . LEFT MEDIAL FEMORAL CONDYLE DEBRIDEMENT & DRILLING/ REMOVAL LOOSE BODY  09-15-2003  . NASAL SEPTUM SURGERY  1985  . STONE EXTRACTION WITH BASKET  06/12/2011   Procedure: STONE EXTRACTION WITH BASKET;  Surgeon: Claybon Jabs, MD;  Location: [facility];  Service: Urology;  Laterality: Right;    There were no vitals filed for this visit.  Subjective Assessment - 05/14/18 1106    Subjective  No changes, no pain.    Patient Stated Goals  Pt's goals for therapy are to get stamina back, consistently everyday.    Currently in Pain?  No/denies                       [facility] Adult PT Treatment/Exercise - 05/14/18 0001      Ambulation/Gait   Ambulation/Gait  Yes    Ambulation/Gait Assistance  7: Independent    Ambulation Distance (Feet)  1310 Feet   in 6MWT   Assistive device  None    Gait Pattern  Decreased arm swing - right;Decreased arm swing - left;Decreased step length - right;Decreased trunk rotation    Ambulation Surface  Level;Indoor  Pre-Gait Activities  Dynamic gait activities:  forward/back walking, marching with reciprocal arm swing, forward step and stop with coordinated UEs, walk and pause with reciprocal arm swing, sidestepping with coordinated arms, 2-4 reps along 25 ft hallway.  Pt requires cues for reciprocal arm swing and coordinated UE movement.    Gait Comments  Pt continues to report walking about 30 minutes each day, but at a slower pace than the 6 MWT pace.          Balance Exercises - 05/14/18 1111      Balance Exercises: Standing   Standing Eyes Opened  Wide (BOA);Narrow base of support (BOS);Head turns;Foam/compliant surface;5 reps   Head nods   Standing Eyes Closed  Wide (BOA);Narrow base of support (BOS);Foam/compliant surface;5 reps;Head turns   Head nods    Tandem Stance  Eyes open;Foam/compliant surface;Upper extremity support 2;5 reps   Head turns, nods; then EC 2 sets x 10 sec   Heel Raises Limitations  10 reps, 3 seconds   2 sets   Toe Raise Limitations  10 reps, 3 seconds   2 sets   Sit to Stand Time  Sit to stand 10 reps with weighted ball lift, then sit to stand x 10 reps with ball/UE trunk rotation;     Other Standing Exercises  Step up-up/down-down 2 sets x 10 reps each leg at 6" step bilateral UE support, then side step ups no UE support x 10 reps.  Heel walking, toe walking, 3 reps each, 10 ft             PT Long Term Goals - 04/18/18 1516      PT LONG TERM GOAL #1   Title  Pt will be independent with Parkinson's specific HEP for improved balance and gait.  TARGET 06/07/18 (due to delayed scheduling with holidays)    Time  5    Period  Weeks    Status  New    Target Date  06/07/18      PT LONG TERM GOAL #2   Title  Pt will perform 5x sit<>stand in less than 11 seconds for improved transfer efficiency and safety.    Time  5    Period  Weeks    Status  New    Target Date  06/07/18      PT LONG TERM GOAL #3   Title  Pt will ambulate at least 1500 ft in 6 MWT, for improved gait endurance and efficiency.    Time  5    Period  Weeks    Status  New    Target Date  06/07/18      PT LONG TERM GOAL #4   Title  Pt will verbalize plans for continued community fitness upon d/c from PT.    Time  5    Period  Weeks    Status  New    Target Date  06/07/18            Plan - 05/14/18 1936    Clinical Impression Statement  Reviewed corner balance exercises, with pt return demo understanding with intermittent use of UEs for support.  Pt appears to need less rest breaks during PT session today.  In addition to corner balance exercises, also focused on functional strengthening, dynamic gait activities.  Pt is improving 6 minute walk, with pt ambulating 1310 ft in 6 minute walk today, compared to 1224 ft at eval.    Rehab  Potential  Good    PT  Frequency  Other (comment)   1x/wk for 1 week, then 2x/wk for 4 weeks   PT Duration  --   total POC = 5 weeks   PT Treatment/Interventions  ADLs/Self Care Home Management;Therapeutic exercise;Therapeutic activities;Functional mobility training;Gait training;Balance training;Neuromuscular re-education;Patient/family education    PT Next Visit Plan  aerobic/funcitonal strengthening for home:  step ups, stair negotiation, sit<>stand; dynamic gait activities    Consulted and Agree with Plan of Care  Patient;Family member/caregiver       Patient will benefit from skilled therapeutic intervention in order to improve the following deficits and impairments:  Abnormal gait, Decreased balance, Decreased endurance, Decreased mobility, Difficulty walking, Decreased strength, Impaired tone, Postural dysfunction  Visit Diagnosis: Unsteadiness on feet  Muscle weakness (generalized)  Other abnormalities of gait and mobility     Problem List Patient Active Problem List   Diagnosis Date Noted  . Controlled type 2 diabetes mellitus with diabetic dermatitis, without long-term current use of insulin ([place]) 01/21/2018  . OSA treated with BiPAP 01/03/2017  . REM sleep behavior disorder 12/06/2016  . Uncontrolled secondary diabetes mellitus with stage 3 CKD (GFR 30-59) (HCC) 10/23/2016  . Medication management 10/23/2016  . Vitamin D deficiency 10/23/2016  . Parkinson disease ([place]) 01/04/2016  . Seasonal and perennial allergic rhinitis 06/21/2014  . Asthma, mild intermittent, well-controlled 06/21/2014  . Pseudomonas aeruginosa colonization 10/08/2012  . CAD (coronary artery disease) 11/15/2011  . Hypertension 11/15/2011  . Hyperlipidemia 11/15/2011  . Morbid obesity with BMI of 40.0-44.9, adult ([place]) 11/15/2011    [facility] 05/14/2018, 7:40 PM  Frazier Butt., PT   [facility] [address] [place], [place], [address] Phone: [phone]   Fax:  [phone]  Name: MICHELE KERLIN MRN: [id number] Date of Birth: Nov 15, 1955

## 2018-05-17 ENCOUNTER — Ambulatory Visit: Payer: 59 | Admitting: Neurology

## 2018-05-17 ENCOUNTER — Ambulatory Visit: Payer: 59 | Admitting: Physical Therapy

## 2018-05-17 ENCOUNTER — Encounter: Payer: Self-pay | Admitting: Physical Therapy

## 2018-05-17 ENCOUNTER — Encounter: Payer: Self-pay | Admitting: Neurology

## 2018-05-17 ENCOUNTER — Ambulatory Visit: Payer: 59

## 2018-05-17 ENCOUNTER — Ambulatory Visit: Payer: 59 | Admitting: Occupational Therapy

## 2018-05-17 VITALS — BP 149/86 | HR 78 | Ht 67.0 in | Wt 293.0 lb

## 2018-05-17 DIAGNOSIS — R2689 Other abnormalities of gait and mobility: Secondary | ICD-10-CM

## 2018-05-17 DIAGNOSIS — R29818 Other symptoms and signs involving the nervous system: Secondary | ICD-10-CM

## 2018-05-17 DIAGNOSIS — R471 Dysarthria and anarthria: Secondary | ICD-10-CM

## 2018-05-17 DIAGNOSIS — G20 Parkinson's disease: Secondary | ICD-10-CM | POA: Diagnosis not present

## 2018-05-17 DIAGNOSIS — R278 Other lack of coordination: Secondary | ICD-10-CM

## 2018-05-17 DIAGNOSIS — M6281 Muscle weakness (generalized): Secondary | ICD-10-CM | POA: Diagnosis not present

## 2018-05-17 DIAGNOSIS — R29898 Other symptoms and signs involving the musculoskeletal system: Secondary | ICD-10-CM

## 2018-05-17 MED ORDER — PRAMIPEXOLE DIHYDROCHLORIDE 0.75 MG PO TABS: 0.7500 mg | ORAL_TABLET | Freq: Three times a day (TID) | ORAL | 2 refills | Status: DC

## 2018-05-17 MED ORDER — CARBIDOPA-LEVODOPA 25-100 MG PO TABS: 2.0000 | ORAL_TABLET | Freq: Three times a day (TID) | ORAL | 2 refills | Status: DC

## 2018-05-17 MED FILL — PRAMIPEXOLE 0.75 MG TABLET: 0.75 | 90 days supply | Qty: 270 | Fill #0

## 2018-05-17 MED FILL — CARBIDOPA/LEVO 25/100 TAB: 25-100 | 90 days supply | Qty: 540 | Fill #0

## 2018-05-17 NOTE — Therapy (Signed)
[facility] [address] [place], [place], [address] Phone: [phone]   Fax:  [phone]  Speech Language Pathology Treatment  Patient Details  Name: Todd Rice MRN: [id number] Date of Birth: [birth year] Referring Provider (SLP): Neldon Newport., MD   Encounter Date: 05/17/2018  End of Session - 05/17/18 1056    Visit Number  6    Number of Visits  17    Date for SLP Re-Evaluation  06/28/18    SLP Start Time  14    SLP Stop Time   1055    SLP Time Calculation (min)  35 min    Activity Tolerance  Patient tolerated treatment well       Past Medical History:  Diagnosis Date  . Adult BMI 50.0-59.9 kg/sq m    52.77  . Anal fissure   . Anxiety   . Diabetes mellitus without complication (HCC)    borderline  . GERD (gastroesophageal reflux disease)    30 years ago, maybe from peptic ulcer  . H/O: gout    STABLE  . Hepatic steatosis 10/01/11  . History of kidney stones   . Hyperlipidemia   . Hypertension   . Kidney tumor    right upper kidney tumor, monitoring  . OSA on CPAP    [facility]  . Parkinson disease ([place]) 01/04/2016  . REM sleep behavior disorder 12/06/2016  . Right ureteral stone   . Ureteral calculus, right 06/12/2011  . Vitamin D deficiency   . Weakness     Past Surgical History:  Procedure Laterality Date  . ANAL FISSURE REPAIR  10-12-2000  . COLONOSCOPY N/A 07/01/2012   Procedure: COLONOSCOPY;  Surgeon: Lafayette Dragon, MD;  [facility];  Service: Endoscopy;  Laterality: N/A;  . CYSTOSCOPY WITH RETROGRADE PYELOGRAM, URETEROSCOPY AND STENT PLACEMENT Bilateral 02/26/2013   Procedure: CYSTOSCOPY WITH RETROGRADE PYELOGRAM, URETEROSCOPY AND STENT PLACEMENT;  Surgeon: Alexis Frock, MD;  [facility]: [facility];  Service: Urology;  Laterality: Bilateral;  . HOLMIUM LASER APPLICATION Bilateral 69/62/9528   Procedure: HOLMIUM LASER APPLICATION;  Surgeon: Alexis Frock, MD;  Location: [facility];   Service: Urology;  Laterality: Bilateral;  . kidney stone removal     06/2011-stent placement  . LEFT MEDIAL FEMORAL CONDYLE DEBRIDEMENT & DRILLING/ REMOVAL LOOSE BODY  09-15-2003  . NASAL SEPTUM SURGERY  1985  . STONE EXTRACTION WITH BASKET  06/12/2011   Procedure: STONE EXTRACTION WITH BASKET;  Surgeon: Claybon Jabs, MD;  Location[facility];  Service: Urology;  Laterality: Right;    There were no vitals filed for this visit.  Subjective Assessment - 05/17/18 1023    Subjective  Pt arrived into ST room with WNL speech.    Patient is accompained by:  Family member   wife   Currently in Pain?  No/denies            ADULT SLP TREATMENT - 05/17/18 1030      General Information   Behavior/Cognition  Alert;Cooperative;Pleasant mood      Treatment Provided   Treatment provided  Cognitive-Linquistic      Cognitive-Linquistic Treatment   Treatment focused on  Dysarthria    Skilled Treatment  SLP used loud /a/ to recalibrate pt's conversational volume. Pt averaged mid to upper 90s dB. 31 minutes conversation outside [facility] room was WNL average (low 70s dB).       Assessment / Recommendations / Plan   Plan  Continue with current plan of care  SLP Short Term Goals - 05/17/18 1048      SLP SHORT TERM GOAL #1   Title  pt will maintain loud /a/ average mid 90s dB with appropriate voicing over 4 sessions    Status  Achieved      SLP SHORT TERM GOAL #2   Title  pt will produce 20/20 sentences with average 70dB over two sessions    Status  Achieved      SLP SHORT TERM GOAL #3   Title  pt will demo simple conversation of 10 minutes with average 70dB over two sessions    Status  Achieved       SLP Long Term Goals - 05/17/18 1049      SLP LONG TERM GOAL #1   Title  pt will maintain loud /a/ at mid 90s dB with proper voicing over 6 sessions    Baseline  05-10-18, 05-14-18, 05-17-18    Time  5    Period  Weeks    Status  On-going      SLP LONG TERM GOAL #2    Title  pt will demo 10 minutes mod complex conversastion with average low 70sdB over two sessions    Status  Achieved      SLP LONG TERM GOAL #3   Title  pt will demo 15 minutes mod complex conversation with average low 70s dB outside ST room, over two sessions    Baseline  05-17-18    Time  5    Period  Weeks    Status  On-going      SLP LONG TERM GOAL #4   Title  pt will demo 15 minutes mod complex conversation with abdominal breathing (AB) 75% of the time over two sessions    Status  Achieved       Plan - 05/17/18 1057    Clinical Impression Statement  Pt with excellent success consistent today with WNL conversational loudness in mod compelx conversation outside [facility] room. Pt would cont to benefit from skilled ST, at one/week due to progress, to improve pt's communicative effectiveness, likely 1-2 more sessions.    Speech Therapy Frequency  1x /week    Duration  --   8 weeks, or 17 sessions (possible 13 sessions?)   Treatment/Interventions  Compensatory strategies;Patient/family education;Functional tasks;Cueing hierarchy;Environmental controls;Internal/external aids;SLP instruction and feedback;Diet toleration management by SLP;Pharyngeal strengthening exercises;Aspiration precaution training    Potential to Achieve Goals  Good    SLP Home Exercise Plan  provided today (/a/ and abdominal breathing)    Consulted and Agree with Plan of Care  Patient       Patient will benefit from skilled therapeutic intervention in order to improve the following deficits and impairments:   Dysarthria and anarthria    Problem List Patient Active Problem List   Diagnosis Date Noted  . Controlled type 2 diabetes mellitus with diabetic dermatitis, without long-term current use of insulin ([place]) 01/21/2018  . OSA treated with BiPAP 01/03/2017  . REM sleep behavior disorder 12/06/2016  . Uncontrolled secondary diabetes mellitus with stage 3 CKD (GFR 30-59) (HCC) 10/23/2016  . Medication management  10/23/2016  . Vitamin D deficiency 10/23/2016  . Parkinson disease ([place]) 01/04/2016  . Seasonal and perennial allergic rhinitis 06/21/2014  . Asthma, mild intermittent, well-controlled 06/21/2014  . Pseudomonas aeruginosa colonization 10/08/2012  . CAD (coronary artery disease) 11/15/2011  . Hypertension 11/15/2011  . Hyperlipidemia 11/15/2011  . Morbid obesity with BMI of 40.0-44.9, adult ([place]) 11/15/2011  [facility], [place]  05/17/2018, 10:58 AM  [facility] [address] [place], [place], [address] Phone: [phone]   Fax:  [phone]   Name: Todd Rice MRN: [id number] Date of Birth: [birth year]

## 2018-05-17 NOTE — Therapy (Signed)
[facility] [address] [place], [place], [address] Phone: [phone]   Fax:  [phone]  Physical Therapy Treatment  Patient Details  Name: Todd Rice MRN: [id number] Date of Birth: February 16, 1956 Referring Provider (PT): Margette Fast   Encounter Date: 05/17/2018  PT End of Session - 05/17/18 1508    Visit Number  5    Number of Visits  10    Date for PT Re-Evaluation  06/17/18    Authorization Type  [employer]    PT Start Time  0849    PT Stop Time  0927    PT Time Calculation (min)  38 min    Activity Tolerance  Patient tolerated treatment well    Behavior During Therapy  [facility] for tasks assessed/performed       Past Medical History:  Diagnosis Date  . Adult BMI 50.0-59.9 kg/sq m    52.77  . Anal fissure   . Anxiety   . Diabetes mellitus without complication (HCC)    borderline  . GERD (gastroesophageal reflux disease)    30 years ago, maybe from peptic ulcer  . H/O: gout    STABLE  . Hepatic steatosis 10/01/11  . History of kidney stones   . Hyperlipidemia   . Hypertension   . Kidney tumor    right upper kidney tumor, monitoring  . OSA on CPAP    [facility]  . Parkinson disease ([place]) 01/04/2016  . REM sleep behavior disorder 12/06/2016  . Right ureteral stone   . Ureteral calculus, right 06/12/2011  . Vitamin D deficiency   . Weakness     Past Surgical History:  Procedure Laterality Date  . ANAL FISSURE REPAIR  10-12-2000  . COLONOSCOPY N/A 07/01/2012   Procedure: COLONOSCOPY;  Surgeon: Lafayette Dragon, MD;  [facility];  Service: Endoscopy;  Laterality: N/A;  . CYSTOSCOPY WITH RETROGRADE PYELOGRAM, URETEROSCOPY AND STENT PLACEMENT Bilateral 02/26/2013   Procedure: CYSTOSCOPY WITH RETROGRADE PYELOGRAM, URETEROSCOPY AND STENT PLACEMENT;  Surgeon: Alexis Frock, MD;  [facility];  Service: Urology;  Laterality: Bilateral;  . HOLMIUM LASER APPLICATION Bilateral 12/87/8676   Procedure:  HOLMIUM LASER APPLICATION;  Surgeon: Alexis Frock, MD;  [facility];  Service: Urology;  Laterality: Bilateral;  . kidney stone removal     06/2011-stent placement  . LEFT MEDIAL FEMORAL CONDYLE DEBRIDEMENT & DRILLING/ REMOVAL LOOSE BODY  09-15-2003  . NASAL SEPTUM SURGERY  1985  . STONE EXTRACTION WITH BASKET  06/12/2011   Procedure: STONE EXTRACTION WITH BASKET;  Surgeon: Claybon Jabs, MD;  Location: [facility];  Service: Urology;  Laterality: Right;    There were no vitals filed for this visit.  Subjective Assessment - 05/17/18 0853    Subjective  Saw Dr. [name] this morning, increased medications to 2 pills 3 times per day.    Patient Stated Goals  Pt's goals for therapy are to get stamina back, consistently everyday.    Currently in Pain?  No/denies                       [facility] Adult PT Treatment/Exercise - 05/17/18 0855      Transfers   Transfers  Sit to Stand;Stand to Sit    Sit to Stand  6: Modified independent (Device/Increase time);Without upper extremity assist;From chair/3-in-1;From bed    Stand to Sit  6: Modified independent (Device/Increase time);Without upper extremity assist;To chair/3-in-1;To bed    Number of Reps  10 reps;Other sets (comment)   4 set   Comments  with addition of weighted ball lifts and weighted ball trunk rotation x 10 reps each      Ambulation/Gait   Stairs  Yes    Stairs Assistance  6: Modified independent (Device/Increase time)    Stair Management Technique  One rail Right;Alternating pattern;Forwards    Number of Stairs  4   x 4 reps   Height of Stairs  6      Exercises   Exercises  Knee/Hip      Knee/Hip Exercises: Aerobic   Stepper  SEated SciFit Stepper, Level 1.8, 4 extremities x 8 minutes, >80 RPM          Balance Exercises - 05/17/18 0906      Balance Exercises: Standing   Heel Raises Limitations  20 reps, 3 seconds    Toe Raise Limitations  20 reps, 3 seconds    Sit to Stand Time  --     Other Standing Exercises  Step up-up/down-down 2 sets x 10 reps each leg at 6" step bilateral UE support; single limb step ups, 3 second hold, 10 reps with UE support; then side step ups no UE support x 10 reps.          PT Education - 05/17/18 1507    Education Details  HEP additions for aerobic activity/functional strengthening    Person(s) Educated  Patient;Spouse    Methods  Explanation;Demonstration;Handout    Comprehension  Verbalized understanding;Returned demonstration          PT Long Term Goals - 04/18/18 1516      PT LONG TERM GOAL #1   Title  Pt will be independent with Parkinson's specific HEP for improved balance and gait.  TARGET 06/07/18 (due to delayed scheduling with holidays)    Time  5    Period  Weeks    Status  New    Target Date  06/07/18      PT LONG TERM GOAL #2   Title  Pt will perform 5x sit<>stand in less than 11 seconds for improved transfer efficiency and safety.    Time  5    Period  Weeks    Status  New    Target Date  06/07/18      PT LONG TERM GOAL #3   Title  Pt will ambulate at least 1500 ft in 6 MWT, for improved gait endurance and efficiency.    Time  5    Period  Weeks    Status  New    Target Date  06/07/18      PT LONG TERM GOAL #4   Title  Pt will verbalize plans for continued community fitness upon d/c from PT.    Time  5    Period  Weeks    Status  New    Target Date  06/07/18            Plan - 05/17/18 1508    Clinical Impression Statement  Skilled PT session focused today on update of HEP to include aerobic activity and functional strengthening.  Pt appears to be doing very well with therapy activities and may not need full POC.  May begin discussion for d/c and look towards LTGs next week.    Rehab Potential  Good    PT Frequency  Other (comment)   1x/wk for 1 week, then 2x/wk for 4 weeks   PT Duration  --   total POC =  5 weeks   PT Treatment/Interventions  ADLs/Self Care Home Management;Therapeutic  exercise;Therapeutic activities;Functional mobility training;Gait training;Balance training;Neuromuscular re-education;Patient/family education    PT Next Visit Plan  Review HEP additions, work towards LTGs; discuss possible early d/c with pt based on his progress    Consulted and Agree with Plan of Care  Patient;Family member/caregiver       Patient will benefit from skilled therapeutic intervention in order to improve the following deficits and impairments:  Abnormal gait, Decreased balance, Decreased endurance, Decreased mobility, Difficulty walking, Decreased strength, Impaired tone, Postural dysfunction  Visit Diagnosis: Other abnormalities of gait and mobility  Muscle weakness (generalized)     Problem List Patient Active Problem List   Diagnosis Date Noted  . Controlled type 2 diabetes mellitus with diabetic dermatitis, without long-term current use of insulin ([place]) 01/21/2018  . OSA treated with BiPAP 01/03/2017  . REM sleep behavior disorder 12/06/2016  . Uncontrolled secondary diabetes mellitus with stage 3 CKD (GFR 30-59) (HCC) 10/23/2016  . Medication management 10/23/2016  . Vitamin D deficiency 10/23/2016  . Parkinson disease ([place]) 01/04/2016  . Seasonal and perennial allergic rhinitis 06/21/2014  . Asthma, mild intermittent, well-controlled 06/21/2014  . Pseudomonas aeruginosa colonization 10/08/2012  . CAD (coronary artery disease) 11/15/2011  . Hypertension 11/15/2011  . Hyperlipidemia 11/15/2011  . Morbid obesity with BMI of 40.0-44.9, adult ([place]) 11/15/2011    MARRIOTT,AMY W. 05/17/2018, 3:11 PM  Frazier Butt., PT  [facility] [address] [place], [place], [address] Phone: [phone]   Fax:  [phone]  Name: LAVONTE PALOS MRN: [id number] Date of Birth: 10-Feb-1956

## 2018-05-17 NOTE — Patient Instructions (Signed)
We will increase the Sinemet to 25/100 mg taking 2 tablets three times a day.

## 2018-05-17 NOTE — Patient Instructions (Signed)
PWR! Hands  With arms stretched out in front of you (elbows straight), perform the following: PWR! Rock: Move wrists up and down BIG PWR! Twist: Twist palms up and down BIG  Then, start with elbows bent and hands closed. PWR! Step: Touch index finger to thumb while keeping other fingers straight. Flick fingers out BIG (thumb out/straighten fingers). Repeat with other fingers. (Step your thumb to each finger). PWR! Hands: Push hands out BIG. Elbows straight, wrists up, fingers open and spread apart BIG. (Can also perform by pushing down on table, chair, knees. Push above head, out to the side, behind you, in front of you.)   ** Make each movement big and deliberate so that you feel the movement.  Perform at least 10 repetitions 1x/day, but perform PWR! hands throughout the day when you are having trouble using your hands (picking up/manipulating small objects, writing, eating, typing, sewing, buttoning, etc.). 

## 2018-05-17 NOTE — Therapy (Signed)
[facility] [address] [place], [place], [address] Phone: [phone]   Fax:  [phone]  Occupational Therapy Treatment  Patient Details  Name: Todd Rice MRN: [id number] Date of Birth: 07-18-1955 Referring Provider (OT): Dr. [name]   Encounter Date: 05/17/2018  OT End of Session - 05/17/18 1301    Visit Number  6    Number of Visits  17    Date for OT Re-Evaluation  06/09/18    Authorization Type  [employer]    OT Start Time  0935    OT Stop Time  1015    OT Time Calculation (min)  40 min       Past Medical History:  Diagnosis Date  . Adult BMI 50.0-59.9 kg/sq m    52.77  . Anal fissure   . Anxiety   . Diabetes mellitus without complication (HCC)    borderline  . GERD (gastroesophageal reflux disease)    30 years ago, maybe from peptic ulcer  . H/O: gout    STABLE  . Hepatic steatosis 10/01/11  . History of kidney stones   . Hyperlipidemia   . Hypertension   . Kidney tumor    right upper kidney tumor, monitoring  . OSA on CPAP    [facility]  . Parkinson disease ([place]) 01/04/2016  . REM sleep behavior disorder 12/06/2016  . Right ureteral stone   . Ureteral calculus, right 06/12/2011  . Vitamin D deficiency   . Weakness     Past Surgical History:  Procedure Laterality Date  . ANAL FISSURE REPAIR  10-12-2000  . COLONOSCOPY N/A 07/01/2012   Procedure: COLONOSCOPY;  Surgeon: Lafayette Dragon, MD;  [facility];  Service: Endoscopy;  Laterality: N/A;  . CYSTOSCOPY WITH RETROGRADE PYELOGRAM, URETEROSCOPY AND STENT PLACEMENT Bilateral 02/26/2013   Procedure: CYSTOSCOPY WITH RETROGRADE PYELOGRAM, URETEROSCOPY AND STENT PLACEMENT;  Surgeon: Alexis Frock, MD;  [facility];  Service: Urology;  Laterality: Bilateral;  . HOLMIUM LASER APPLICATION Bilateral 40/12/6759   Procedure: HOLMIUM LASER APPLICATION;  Surgeon: Alexis Frock, MD;  [facility];  Service: Urology;  Laterality: Bilateral;   . kidney stone removal     06/2011-stent placement  . LEFT MEDIAL FEMORAL CONDYLE DEBRIDEMENT & DRILLING/ REMOVAL LOOSE BODY  09-15-2003  . NASAL SEPTUM SURGERY  1985  . STONE EXTRACTION WITH BASKET  06/12/2011   Procedure: STONE EXTRACTION WITH BASKET;  Surgeon: Claybon Jabs, MD;  Location: [facility];  Service: Urology;  Laterality: Right;    There were no vitals filed for this visit.  Subjective Assessment - 05/17/18 1311    Pertinent History  PMH: PD, DM, CAD, HTN, hyperlipidemia, kidney tumor. Pt is retired and lives with his wife. He enjoys travel    Patient Stated Goals  improve right hand function    Currently in Pain?  No/denies            Treatment:Ambulating with tossing ball between hands with min cueing for large amplitude movements, while performing cognitive task min v.c with UEs and with PWR! Hands.    Dynamic step and reach with trunk rotation and to each side to placing large  pegs in pegboard with each hand with min cueing larger amplitude movements PWR! Hands prior to picking up peg, and deliberate movements.   Sitting, stacking/unstacking blocks with each hand simultaneously with min cueing/focus For bilateral coordination/timing, PWR! Hands, deliberate large amplitude movements.  PWR! Hands basic 4, handout issued 0 reps each  min v.c for larger amplitude movments  Reviewed strategies for fastening buttons, with practice, therapist timed 3 button/ unbutton and pt met his goal.                 OT Short Term Goals - 05/17/18 1008      OT SHORT TERM GOAL #1   Title  I with PD  specific HEP.    Time  4    Period  Weeks    Status  Achieved      OT SHORT TERM GOAL #2   Title  I with adapted strategies for ADLs/ IADLs.    Time  4    Period  Weeks    Status  On-going      OT SHORT TERM GOAL #3   Title  Pt will demonstrate improved RUE fucntional use for ADLs as evidenced by increasing box/ blocks score to 42 blocks     Baseline  RUE 37, LUE 47    Time  4    Period  Weeks    Status  On-going        OT Long Term Goals - 05/17/18 1011      OT LONG TERM GOAL #1   Title  Pt will verbalize understanding of appropriate PD community resources.    Status  On-going      OT LONG TERM GOAL #2   Title  Pt will demonstrate improved fine motor coordination for ADLSas evidenced by decreasing RUE 9 hole peg test score to 36 secs or less.    Status  On-going      OT LONG TERM GOAL #3   Title  Pt will perform 3 button / unbutton in 60 secs or less    Status  Achieved   35.75 secs           Plan - 05/17/18 1307    Clinical Impression Statement  pt is progressing towards goals. He met his long term goal for fastening buttons. anticipate pt may be able to d/c early at the end of next week due to progress.    Occupational Profile and client history currently impacting functional performance  PMH: PD, DM, CAD, HTN, hyperlipidemia, kidney tumor. Pt is retired and lives with his wife. He enjoys travel    Occupational performance deficits (Please refer to evaluation for details):  ADL's;Play;Leisure    Rehab Potential  Good    Current Impairments/barriers affecting progress:  tremor, bradykinesia    OT Frequency  2x / week    OT Duration  8 weeks    OT Treatment/Interventions  Self-care/ADL training;Therapeutic exercise;Patient/family education;Neuromuscular education;Paraffin;Moist Heat;Aquatic Therapy;Fluidtherapy;Energy conservation;Therapeutic activities;Balance training;Functional Mobility Training;DME and/or AE instruction;Manual Therapy;Cryotherapy;Ultrasound;Contrast [employer], work towards unmet goals    Consulted and Agree with Plan of Care  Patient;Family member/caregiver       Patient will benefit from skilled therapeutic intervention in order to improve the following deficits and impairments:  Impaired flexibility, Decreased mobility, Decreased coordination, Decreased activity  tolerance, Decreased endurance, Decreased range of motion, Decreased strength, Impaired UE functional use, Impaired tone, Decreased balance, Difficulty walking, Impaired perceived functional ability, Obesity  Visit Diagnosis: Other lack of coordination  Other symptoms and signs involving the nervous system  Other symptoms and signs involving the musculoskeletal system  Other abnormalities of gait and mobility  Muscle weakness (generalized)    Problem List Patient Active Problem List   Diagnosis Date Noted  . Controlled type 2 diabetes mellitus with  diabetic dermatitis, without long-term current use of insulin ([place]) 01/21/2018  . OSA treated with BiPAP 01/03/2017  . REM sleep behavior disorder 12/06/2016  . Uncontrolled secondary diabetes mellitus with stage 3 CKD (GFR 30-59) (HCC) 10/23/2016  . Medication management 10/23/2016  . Vitamin D deficiency 10/23/2016  . Parkinson disease ([place]) 01/04/2016  . Seasonal and perennial allergic rhinitis 06/21/2014  . Asthma, mild intermittent, well-controlled 06/21/2014  . Pseudomonas aeruginosa colonization 10/08/2012  . CAD (coronary artery disease) 11/15/2011  . Hypertension 11/15/2011  . Hyperlipidemia 11/15/2011  . Morbid obesity with BMI of 40.0-44.9, adult ([place]) 11/15/2011    , 05/17/2018, 1:11 PM  [facility] [address] [place], [place], [address] Phone: [phone]   Fax:  [phone]  Name: DIGBY GROENEVELD MRN: [id number] Date of Birth: 12-11-1955

## 2018-05-17 NOTE — Progress Notes (Signed)
Reason for visit: Parkinson's disease  ABDULKADIR EMMANUEL is an 63 y.o. male  History of present illness:  Mr. Tullier is a 63 year old right-handed white male with a history of Parkinson's disease.  The patient currently is an occupational, speech, and physical therapy.  The patient is on Sinemet taking the 25/100 mg tablets, 1.5 tablets 3 times daily.  He is on 0.75 mg of Mirapex 3 times daily.  The patient is having ongoing problems with clumsiness of the right hand, he has difficulty picking up small objects with the hand.  He may have some slight tremors at times.  He is on BiPAP at night, he will have vivid dreams at times.  The patient is trying to stay active, he reports no falls.  He denies issues with swallowing.  He returns to this office for an evaluation.  Past Medical History:  Diagnosis Date  . Adult BMI 50.0-59.9 kg/sq m    52.77  . Anal fissure   . Anxiety   . Diabetes mellitus without complication (HCC)    borderline  . GERD (gastroesophageal reflux disease)    30 years ago, maybe from peptic ulcer  . H/O: gout    STABLE  . Hepatic steatosis 10/01/11  . History of kidney stones   . Hyperlipidemia   . Hypertension   . Kidney tumor    right upper kidney tumor, monitoring  . OSA on CPAP    [facility]  . Parkinson disease ([place]) 01/04/2016  . REM sleep behavior disorder 12/06/2016  . Right ureteral stone   . Ureteral calculus, right 06/12/2011  . Vitamin D deficiency   . Weakness     Past Surgical History:  Procedure Laterality Date  . ANAL FISSURE REPAIR  10-12-2000  . COLONOSCOPY N/A 07/01/2012   Procedure: COLONOSCOPY;  Surgeon: Lafayette Dragon, MD;  [facility];  Service: Endoscopy;  Laterality: N/A;  . CYSTOSCOPY WITH RETROGRADE PYELOGRAM, URETEROSCOPY AND STENT PLACEMENT Bilateral 02/26/2013   Procedure: CYSTOSCOPY WITH RETROGRADE PYELOGRAM, URETEROSCOPY AND STENT PLACEMENT;  Surgeon: Alexis Frock, MD;  [facility];  Service: Urology;   Laterality: Bilateral;  . HOLMIUM LASER APPLICATION Bilateral 95/01/3266   Procedure: HOLMIUM LASER APPLICATION;  Surgeon: Alexis Frock, MD;  [facility];  Service: Urology;  Laterality: Bilateral;  . kidney stone removal     06/2011-stent placement  . LEFT MEDIAL FEMORAL CONDYLE DEBRIDEMENT & DRILLING/ REMOVAL LOOSE BODY  09-15-2003  . NASAL SEPTUM SURGERY  1985  . STONE EXTRACTION WITH BASKET  06/12/2011   Procedure: STONE EXTRACTION WITH BASKET;  Surgeon: Claybon Jabs, MD;  Location: [facility];  Service: Urology;  Laterality: Right;    Family History  Problem Relation Age of Onset  . Heart disease Mother        Atrial fibrillation  . Hypertension Mother   . Hyperlipidemia Mother   . Lung cancer Father        was a smoker  . Cancer Father        lung  . Hyperlipidemia Brother   . Heart disease Maternal Aunt   . Hyperlipidemia Maternal Aunt   . Hypertension Maternal Aunt   . Stroke Maternal Aunt   . Heart disease Paternal Grandmother   . Hyperlipidemia Paternal Grandmother     Social history:  reports that he has never smoked. He has never used smokeless tobacco. He reports that he does not drink alcohol or use drugs.    Allergies  Allergen  Reactions  . Ciprofloxacin Hives  . Ceftriaxone Hives    Medications:  Prior to Admission medications   Medication Sig Start Date End Date Taking? Authorizing Provider  allopurinol (ZYLOPRIM) 300 MG tablet TAKE 1 TABLET BY MOUTH ONCE DAILY 09/05/17  Yes [name], NP  ALPRAZolam (XANAX) 0.5 MG tablet Take 1 tablet (0.5 mg total) by mouth 3 (three) times daily. 01/21/18  Yes Vicie Mutters, PA-C  aspirin EC 81 MG tablet Take 81 mg by mouth at bedtime.   Yes [provider]  buPROPion (WELLBUTRIN XL) 150 MG 24 hr tablet Take 1 tablet (150 mg total) by mouth every morning. 04/25/18 04/25/19 Yes Vicie Mutters, PA-C  carbidopa-levodopa (SINEMET IR) 25-100 MG tablet Take 1.5 tablets by mouth 3 (three)  times daily. 11/06/17  Yes Kathrynn Ducking, MD  COLCRYS 0.6 MG tablet Take 1 tablet (0.6 mg total) by mouth 2 (two) times daily. 12/11/14  Yes Rolene Course, PA-C  metFORMIN (GLUCOPHAGE-XR) 500 MG 24 hr tablet TAKE 2 TABLETS BY MOUTH 2 TIMES A DAY FOR DIABETES 06/04/17  Yes [name], NP  pramipexole (MIRAPEX) 0.75 MG tablet Take 1 tablet (0.75 mg total) by mouth 3 (three) times daily. 05/14/17  Yes Kathrynn Ducking, MD  rosuvastatin (CRESTOR) 40 MG tablet Take 1 tablet (40 mg total) by mouth daily. 01/21/18  Yes Vicie Mutters, PA-C  sitaGLIPtin (JANUVIA) 100 MG tablet TAKE 1 TABLET BY MOUTH ONCE DAILY FOR DIABETES 03/04/18  Yes Unk Pinto, MD  valsartan-hydrochlorothiazide (DIOVAN-HCT) 320-25 MG tablet Take 1 tablet by mouth daily. 01/21/18  Yes Vicie Mutters, PA-C  VENTOLIN HFA 108 (90 Base) MCG/ACT inhaler INHALE 2 PUFFS INTO THE LUNGS EVERY 6 HOURS AS NEEDED FOR WHEEZING OR SHORTNESS OF BREATH. 05/06/18  Yes Unk Pinto, MD  vitamin C (ASCORBIC ACID) 500 MG tablet Take 1 tablet (500 mg total) by mouth 3 (three) times daily. 01/16/17  Yes [name], NP  Vitamin D, Ergocalciferol, (DRISDOL) 50000 units CAPS capsule Take 50,000 Units by mouth every 7 (seven) days.   Yes [provider]    ROS:  Out of a complete 14 system review of symptoms, the patient complains only of the following symptoms, and all other reviewed systems are negative.  Constipation  Blood pressure (!) 149/86, pulse 78, height 5\' 7"  (1.702 m), weight 293 lb (132.9 kg).  Physical Exam  General: The patient is alert and cooperative at the time of the examination.  The patient is markedly obese.  Skin: No significant peripheral edema is noted.   Neurologic Exam  Mental status: The patient is alert and oriented x 3 at the time of the examination. The patient has apparent normal recent and remote memory, with an apparently normal attention span and concentration ability.   Cranial nerves:  Facial symmetry is present. Speech is normal, no aphasia or dysarthria is noted. Extraocular movements are full. Visual fields are full.  Masking of the face is seen.  Motor: The patient has good strength in all 4 extremities.  Sensory examination: Soft touch sensation is symmetric on the face, arms, and legs.  Coordination: The patient has good finger-nose-finger and heel-to-shin bilaterally.  Gait and station: The patient has a normal gait.  The patient appears to have fairly good arm swing with walking bilaterally, good turns.  Tandem gait is normal. Romberg is negative. No drift is seen.  Reflexes: Deep tendon reflexes are symmetric.   Assessment/Plan:  1.  Parkinson's disease  The patient will be increased on the  Sinemet taking 2 of the 25/100 mg tablets 3 times daily, in the future we will increase the Mirapex to 1 mg 3 times daily.  The patient was given a prescription for the 0.75 mg Mirapex tablets and for the Sinemet.  He will follow-up in 5 months.  Jill Alexanders MD 05/17/2018 8:14 AM  [facility] [address] [place],  [address]  Phone [phone] Fax [phone]

## 2018-05-17 NOTE — Patient Instructions (Signed)
Access Code: [email or web address]  URL: [email or web address]  Date: 05/17/2018  Prepared by: Mady Haagensen   Exercises  Sit to Stand - 10 reps - 2 sets - 1x daily - 5x weekly  Step Up - 10 reps - 2 sets - 3 sec hold - 1x daily - 5x weekly  Lateral Step Ups - 10 reps - 2 sets - 3 sec hold - 1x daily - 5x weekly  Heel Toe Raises with Counter Support - 10 reps - 2 sets - 1x daily - 5x weekly

## 2018-05-21 ENCOUNTER — Encounter: Payer: Self-pay | Admitting: Physical Therapy

## 2018-05-21 ENCOUNTER — Encounter: Payer: 59 | Admitting: Speech Pathology

## 2018-05-21 ENCOUNTER — Ambulatory Visit: Payer: 59 | Admitting: Occupational Therapy

## 2018-05-21 ENCOUNTER — Ambulatory Visit: Payer: 59 | Admitting: Physical Therapy

## 2018-05-21 ENCOUNTER — Encounter: Payer: Self-pay | Admitting: Occupational Therapy

## 2018-05-21 DIAGNOSIS — R2681 Unsteadiness on feet: Secondary | ICD-10-CM

## 2018-05-21 DIAGNOSIS — R2689 Other abnormalities of gait and mobility: Secondary | ICD-10-CM

## 2018-05-21 DIAGNOSIS — M6281 Muscle weakness (generalized): Secondary | ICD-10-CM

## 2018-05-21 DIAGNOSIS — R29818 Other symptoms and signs involving the nervous system: Secondary | ICD-10-CM

## 2018-05-21 DIAGNOSIS — R29898 Other symptoms and signs involving the musculoskeletal system: Secondary | ICD-10-CM

## 2018-05-21 DIAGNOSIS — R278 Other lack of coordination: Secondary | ICD-10-CM

## 2018-05-21 DIAGNOSIS — R293 Abnormal posture: Secondary | ICD-10-CM

## 2018-05-21 NOTE — Therapy (Signed)
[facility] [address] [place], [place], [address] Phone: [phone]   Fax:  [phone]  Physical Therapy Treatment  Patient Details  Name: Todd Rice MRN: [id number] Date of Birth: 07-Nov-1955 Referring Provider (PT): Margette Fast   Encounter Date: 05/21/2018  PT End of Session - 05/21/18 1502    Visit Number  6    Number of Visits  10    Date for PT Re-Evaluation  06/17/18    Authorization Type  UMR    PT Start Time  1022    PT Stop Time  1100    PT Time Calculation (min)  38 min    Activity Tolerance  Patient tolerated treatment well    Behavior During Therapy  [facility] for tasks assessed/performed       Past Medical History:  Diagnosis Date  . Adult BMI 50.0-59.9 kg/sq m    52.77  . Anal fissure   . Anxiety   . Diabetes mellitus without complication (HCC)    borderline  . GERD (gastroesophageal reflux disease)    30 years ago, maybe from peptic ulcer  . H/O: gout    STABLE  . Hepatic steatosis 10/01/11  . History of kidney stones   . Hyperlipidemia   . Hypertension   . Kidney tumor    right upper kidney tumor, monitoring  . OSA on CPAP    [facility]  . Parkinson disease ([place]) 01/04/2016  . REM sleep behavior disorder 12/06/2016  . Right ureteral stone   . Ureteral calculus, right 06/12/2011  . Vitamin D deficiency   . Weakness     Past Surgical History:  Procedure Laterality Date  . ANAL FISSURE REPAIR  10-12-2000  . COLONOSCOPY N/A 07/01/2012   Procedure: COLONOSCOPY;  Surgeon: Lafayette Dragon, MD;  [facility];  Service: Endoscopy;  Laterality: N/A;  . CYSTOSCOPY WITH RETROGRADE PYELOGRAM, URETEROSCOPY AND STENT PLACEMENT Bilateral 02/26/2013   Procedure: CYSTOSCOPY WITH RETROGRADE PYELOGRAM, URETEROSCOPY AND STENT PLACEMENT;  Surgeon: Alexis Frock, MD;  [facility];  Service: Urology;  Laterality: Bilateral;  . HOLMIUM LASER APPLICATION Bilateral 60/73/7106   Procedure:  HOLMIUM LASER APPLICATION;  Surgeon: Alexis Frock, MD;  [facility];  Service: Urology;  Laterality: Bilateral;  . kidney stone removal     06/2011-stent placement  . LEFT MEDIAL FEMORAL CONDYLE DEBRIDEMENT & DRILLING/ REMOVAL LOOSE BODY  09-15-2003  . NASAL SEPTUM SURGERY  1985  . STONE EXTRACTION WITH BASKET  06/12/2011   Procedure: STONE EXTRACTION WITH BASKET;  Surgeon: Claybon Jabs, MD;  Location: [facility];  Service: Urology;  Laterality: Right;    There were no vitals filed for this visit.  Subjective Assessment - 05/21/18 1024    Subjective  No changes,no pain.    Patient Stated Goals  Pt's goals for therapy are to get stamina back, consistently everyday.    Currently in Pain?  No/denies                       [facility] Adult PT Treatment/Exercise - 05/21/18 0001      Ambulation/Gait   Ambulation/Gait  Yes    Ambulation/Gait Assistance  7: Independent    Ambulation Distance (Feet)  700 Feet    Assistive device  None    Gait Pattern  Decreased arm swing - right;Decreased arm swing - left;Decreased step length - right;Decreased trunk rotation    Ambulation Surface  Level;Indoor    Gait  Comments  Gait with conversational tasks, slow-pace, fast-pace gait, gait with head turns; no LOB noted with gait.      High Level Balance   High Level Balance Activities  Side stepping;Marching forwards   With coordinated UEs, 4 reps 20 ft.   High Level Balance Comments  Tandem gait, then tandem marching 4 reps x 20 ft, supervision.  Dynamic Balance activities:  Forward lunge steps, stepping over hurdles forwards, sidestepping, stepping over obstacles, then stepping onto obstables, cues for PWR! Up in SLS on higher obstacel surfaces (riverstones); 1 episode of LOB backwards, but pt able to regain.      Self-Care   Self-Care  Other Self-Care Comments    Other Self-Care Comments   Discussed POC, discussed likely early discharge, probable d/c early next week.   Discussed exercise program:  to optimally include PD-specific HEP exercises, walking for exercises (pt currently doing 3-4 times per week, 30 minutes) and aerobic exercise.  Pt is planning to join [employer] class upon d/c from PT.      Exercises   Exercises  Knee/Hip      Knee/Hip Exercises: Aerobic   Stepper  SEated SciFit Stepper, Level 1.8, 4 extremities x 8 minutes, >80 RPM                  PT Long Term Goals - 04/18/18 1516      PT LONG TERM GOAL #1   Title  Pt will be independent with Parkinson's specific HEP for improved balance and gait.  TARGET 06/07/18 (due to delayed scheduling with holidays)    Time  5    Period  Weeks    Status  New    Target Date  06/07/18      PT LONG TERM GOAL #2   Title  Pt will perform 5x sit<>stand in less than 11 seconds for improved transfer efficiency and safety.    Time  5    Period  Weeks    Status  New    Target Date  06/07/18      PT LONG TERM GOAL #3   Title  Pt will ambulate at least 1500 ft in 6 MWT, for improved gait endurance and efficiency.    Time  5    Period  Weeks    Status  New    Target Date  06/07/18      PT LONG TERM GOAL #4   Title  Pt will verbalize plans for continued community fitness upon d/c from PT.    Time  5    Period  Weeks    Status  New    Target Date  06/07/18            Plan - 05/21/18 1502    Clinical Impression Statement  Skilled PT session focused on aerobic activity, dynamic gait and balance activities.  Particularly focused on SLS activities including stepping over, around obstacles and stepping onto higher height surfaces-pt has occasional instability with dynamic SLS activities.  Pt will continue to benefit from skilled PT to address balance, gait, transition to community fitness options.    Rehab Potential  Good    PT Frequency  Other (comment)   1x/wk for 1 week, then 2x/wk for 4 weeks   PT Duration  --   total POC = 5 weeks   PT Treatment/Interventions  ADLs/Self  Care Home Management;Therapeutic exercise;Therapeutic activities;Functional mobility training;Gait training;Balance training;Neuromuscular re-education;Patient/family education    PT Next Visit Plan  Dynamic  gait and balance activities, including SLS activities; per discussion with pt this visit, will likely check goals and d/c week of 05/27/2018    Consulted and Agree with Plan of Care  Patient;Family member/caregiver       Patient will benefit from skilled therapeutic intervention in order to improve the following deficits and impairments:  Abnormal gait, Decreased balance, Decreased endurance, Decreased mobility, Difficulty walking, Decreased strength, Impaired tone, Postural dysfunction  Visit Diagnosis: Muscle weakness (generalized)  Unsteadiness on feet  Other abnormalities of gait and mobility     Problem List Patient Active Problem List   Diagnosis Date Noted  . Controlled type 2 diabetes mellitus with diabetic dermatitis, without long-term current use of insulin ([place]) 01/21/2018  . OSA treated with BiPAP 01/03/2017  . REM sleep behavior disorder 12/06/2016  . Uncontrolled secondary diabetes mellitus with stage 3 CKD (GFR 30-59) (HCC) 10/23/2016  . Medication management 10/23/2016  . Vitamin D deficiency 10/23/2016  . Parkinson disease ([place]) 01/04/2016  . Seasonal and perennial allergic rhinitis 06/21/2014  . Asthma, mild intermittent, well-controlled 06/21/2014  . Pseudomonas aeruginosa colonization 10/08/2012  . CAD (coronary artery disease) 11/15/2011  . Hypertension 11/15/2011  . Hyperlipidemia 11/15/2011  . Morbid obesity with BMI of 40.0-44.9, adult ([place]) 11/15/2011    Charnese Federici W. 05/21/2018, 3:08 PM  Frazier Butt., PT   [facility] [address] [place], [place], [address] Phone: [phone]   Fax:  [phone]  Name: Todd Rice MRN: [id number] Date of Birth: [birth year]

## 2018-05-21 NOTE — Patient Instructions (Signed)
Ways to prevent future Parkinson's related complications:  1.   Exercise regularly.  Perform your therapy exercises and incorporate safe aerobic exercise when possible (swimming, stationary bike, arm bike, seated stepper) 2.   Focus on BIGGER movements during daily activities- really reach overhead, straighten elbows and extend fingers 3.   When dressing or reaching for your seatbelt make sure to use your body to assist by twisting and looking at where you are reaching while you reach--this can help to minimize stress on the shoulder and reduce the risk of a rotator cuff tear 4.   Swing your arms when you walk!  People with PD are at increased risk for frozen shoulder and swinging your arms can reduce this risk. 5.  Keep you feet apart when you are standing to allow you to have better balance and reach further (which can help with shoulder rigidity).  Also make sure your feet are apart when you are sitting before you stand up. 6.  When you reach for something overhead, make sure your thumb is facing up.  This is a better position for your shoulder. 7.  Anytime you reach or move shoulder, make sure you have good upright posture.

## 2018-05-21 NOTE — Therapy (Signed)
[facility] [address] [place], [place], [address] Phone: [phone]   Fax:  [phone]  Occupational Therapy Treatment  Patient Details  Name: Todd Rice MRN: [id number] Date of Birth: 1956-04-25 Referring Provider (OT): Dr. [name]   Encounter Date: 05/21/2018  OT End of Session - 05/21/18 0948    Visit Number  7    Number of Visits  17    Date for OT Re-Evaluation  06/09/18    Authorization Type  [employer]HC    Authorization Time Period  -    OT Start Time  0933    OT Stop Time  1015    OT Time Calculation (min)  42 min    Activity Tolerance  Patient tolerated treatment well    Behavior During Therapy  [facility] for tasks assessed/performed       Past Medical History:  Diagnosis Date  . Adult BMI 50.0-59.9 kg/sq m    52.77  . Anal fissure   . Anxiety   . Diabetes mellitus without complication (HCC)    borderline  . GERD (gastroesophageal reflux disease)    30 years ago, maybe from peptic ulcer  . H/O: gout    STABLE  . Hepatic steatosis 10/01/11  . History of kidney stones   . Hyperlipidemia   . Hypertension   . Kidney tumor    right upper kidney tumor, monitoring  . OSA on CPAP    [facility]  . Parkinson disease ([place]) 01/04/2016  . REM sleep behavior disorder 12/06/2016  . Right ureteral stone   . Ureteral calculus, right 06/12/2011  . Vitamin D deficiency   . Weakness     Past Surgical History:  Procedure Laterality Date  . ANAL FISSURE REPAIR  10-12-2000  . COLONOSCOPY N/A 07/01/2012   Procedure: COLONOSCOPY;  Surgeon: Lafayette Dragon, MD;  [facility];  Service: Endoscopy;  Laterality: N/A;  . CYSTOSCOPY WITH RETROGRADE PYELOGRAM, URETEROSCOPY AND STENT PLACEMENT Bilateral 02/26/2013   Procedure: CYSTOSCOPY WITH RETROGRADE PYELOGRAM, URETEROSCOPY AND STENT PLACEMENT;  Surgeon: Alexis Frock, MD;  [facility];  Service: Urology;  Laterality: Bilateral;  . HOLMIUM LASER APPLICATION  Bilateral 61/44/3154   Procedure: HOLMIUM LASER APPLICATION;  Surgeon: Alexis Frock, MD;  [facility];  Service: Urology;  Laterality: Bilateral;  . kidney stone removal     06/2011-stent placement  . LEFT MEDIAL FEMORAL CONDYLE DEBRIDEMENT & DRILLING/ REMOVAL LOOSE BODY  09-15-2003  . NASAL SEPTUM SURGERY  1985  . STONE EXTRACTION WITH BASKET  06/12/2011   Procedure: STONE EXTRACTION WITH BASKET;  Surgeon: Claybon Jabs, MD;  Location: [facility];  Service: Urology;  Laterality: Right;    There were no vitals filed for this visit.  Subjective Assessment - 05/21/18 0936    Subjective   taking 2 pills 3x/day now.  I really haven't seen much difference from when I was diagnosed    Pertinent History  PMH: PD, DM, CAD, HTN, hyperlipidemia, kidney tumor. Pt is retired and lives with his wife. He enjoys travel    Patient Stated Goals  improve right hand function    Currently in Pain?  No/denies         PWR! Moves (up, rock, twist) in quadruped x 15-20 each with min cues For incr movement amplitude (finger abduction, elbow ext, posture).  In standing, functional reaching in ER and overhead to place large pegs in vertical pegboard with each UE incorporating trunk rotation, wt. Shift, PWR!  Hand, and in-hand manipulation of 3 pegs at a time.  Min cueing for movement amplitude.         OT Education - 05/21/18 1258    Education Details  PD-related [employer] (full list); Ways to Prevent Future Complications    Person(s) Educated  Patient;Spouse    Methods  Explanation;Handout    Comprehension  Verbalized understanding       OT Short Term Goals - 05/17/18 1008      OT SHORT TERM GOAL #1   Title  I with PD  specific HEP.    Time  4    Period  Weeks    Status  Achieved      OT SHORT TERM GOAL #2   Title  I with adapted strategies for ADLs/ IADLs.    Time  4    Period  Weeks    Status  On-going      OT SHORT TERM GOAL #3   Title  Pt will  demonstrate improved RUE fucntional use for ADLs as evidenced by increasing box/ blocks score to 42 blocks    Baseline  RUE 37, LUE 47    Time  4    Period  Weeks    Status  On-going        OT Long Term Goals - 05/17/18 1011      OT LONG TERM GOAL #1   Title  Pt will verbalize understanding of appropriate PD community resources.    Status  On-going      OT LONG TERM GOAL #2   Title  Pt will demonstrate improved fine motor coordination for ADLSas evidenced by decreasing RUE 9 hole peg test score to 36 secs or less.    Status  On-going      OT LONG TERM GOAL #3   Title  Pt will perform 3 button / unbutton in 60 secs or less    Status  Achieved   35.75 secs           Plan - 05/21/18 0950    Clinical Impression Statement  Pt is progressing towards goals and is making good progress.  Pt reports that he has not noticed a difference with medication increase yet.    Occupational Profile and client history currently impacting functional performance  PMH: PD, DM, CAD, HTN, hyperlipidemia, kidney tumor. Pt is retired and lives with his wife. He enjoys travel    Occupational performance deficits (Please refer to evaluation for details):  ADL's;Play;Leisure    Rehab Potential  Good    Current Impairments/barriers affecting progress:  tremor, bradykinesia    OT Frequency  2x / week    OT Duration  8 weeks    OT Treatment/Interventions  Self-care/ADL training;Therapeutic exercise;Patient/family education;Neuromuscular education;Paraffin;Moist Heat;Aquatic Therapy;Fluidtherapy;Energy conservation;Therapeutic activities;Balance training;Functional Mobility Training;DME and/or AE instruction;Manual Therapy;Cryotherapy;Ultrasound;Contrast Bath    Plan  check remaining STGs, strategies for ADLs; discuss plan for d/c    Consulted and Agree with Plan of Care  Patient;Family member/caregiver       Patient will benefit from skilled therapeutic intervention in order to improve the following  deficits and impairments:  Impaired flexibility, Decreased mobility, Decreased coordination, Decreased activity tolerance, Decreased endurance, Decreased range of motion, Decreased strength, Impaired UE functional use, Impaired tone, Decreased balance, Difficulty walking, Impaired perceived functional ability, Obesity  Visit Diagnosis: Other lack of coordination  Other symptoms and signs involving the nervous system  Other symptoms and signs involving the musculoskeletal system  Other abnormalities of gait  and mobility  Muscle weakness (generalized)  Unsteadiness on feet  Abnormal posture    Problem List Patient Active Problem List   Diagnosis Date Noted  . Controlled type 2 diabetes mellitus with diabetic dermatitis, without long-term current use of insulin ([place]) 01/21/2018  . OSA treated with BiPAP 01/03/2017  . REM sleep behavior disorder 12/06/2016  . Uncontrolled secondary diabetes mellitus with stage 3 CKD (GFR 30-59) (HCC) 10/23/2016  . Medication management 10/23/2016  . Vitamin D deficiency 10/23/2016  . Parkinson disease ([place]) 01/04/2016  . Seasonal and perennial allergic rhinitis 06/21/2014  . Asthma, mild intermittent, well-controlled 06/21/2014  . Pseudomonas aeruginosa colonization 10/08/2012  . CAD (coronary artery disease) 11/15/2011  . Hypertension 11/15/2011  . Hyperlipidemia 11/15/2011  . Morbid obesity with BMI of 40.0-44.9, adult [facility]) 11/15/2011    [facility] 05/21/2018, 3:49 PM  [facility] [address] [place], [place], [address] Phone: [phone]   Fax:  [phone]  Name: Todd Rice MRN: [id number] Date of Birth: 1955-09-08   Vianne Bulls, OTR/L [facility] [address] [place], [place]  [address] [phone] phone [phone] 05/21/18 3:49 PM

## 2018-05-24 ENCOUNTER — Ambulatory Visit: Payer: 59 | Admitting: Occupational Therapy

## 2018-05-24 ENCOUNTER — Ambulatory Visit: Payer: 59

## 2018-05-24 ENCOUNTER — Encounter: Payer: Self-pay | Admitting: Physical Therapy

## 2018-05-24 ENCOUNTER — Ambulatory Visit: Payer: 59 | Admitting: Physical Therapy

## 2018-05-24 DIAGNOSIS — R471 Dysarthria and anarthria: Secondary | ICD-10-CM

## 2018-05-24 DIAGNOSIS — M6281 Muscle weakness (generalized): Secondary | ICD-10-CM | POA: Diagnosis not present

## 2018-05-24 DIAGNOSIS — R2689 Other abnormalities of gait and mobility: Secondary | ICD-10-CM

## 2018-05-24 DIAGNOSIS — R278 Other lack of coordination: Secondary | ICD-10-CM

## 2018-05-24 DIAGNOSIS — R29818 Other symptoms and signs involving the nervous system: Secondary | ICD-10-CM

## 2018-05-24 DIAGNOSIS — R2681 Unsteadiness on feet: Secondary | ICD-10-CM

## 2018-05-24 DIAGNOSIS — R29898 Other symptoms and signs involving the musculoskeletal system: Secondary | ICD-10-CM

## 2018-05-24 NOTE — Therapy (Signed)
[facility] [address] [place], [place], [address] Phone: [phone]   Fax:  [phone]  Occupational Therapy Treatment  Patient Details  Name: Todd Rice MRN: [id number] Date of Birth: 07-13-1955 Referring Provider (OT): Dr. [name]   Encounter Date: 05/24/2018  OT End of Session - 05/24/18 0949    Visit Number  8    Number of Visits  17    Date for OT Re-Evaluation  06/09/18    Authorization Type  [employer]    Authorization Time Period  90 visits per year, new insurance since January    Authorization - Visit Number  5   since new year   OT Start Time  0933    OT Stop Time  1015    OT Time Calculation (min)  42 min    Activity Tolerance  Patient tolerated treatment well    Behavior During Therapy  [facility] for tasks assessed/performed       Past Medical History:  Diagnosis Date  . Adult BMI 50.0-59.9 kg/sq m    52.77  . Anal fissure   . Anxiety   . Diabetes mellitus without complication (HCC)    borderline  . GERD (gastroesophageal reflux disease)    30 years ago, maybe from peptic ulcer  . H/O: gout    STABLE  . Hepatic steatosis 10/01/11  . History of kidney stones   . Hyperlipidemia   . Hypertension   . Kidney tumor    right upper kidney tumor, monitoring  . OSA on CPAP    [facility]  . Parkinson disease ([place]) 01/04/2016  . REM sleep behavior disorder 12/06/2016  . Right ureteral stone   . Ureteral calculus, right 06/12/2011  . Vitamin D deficiency   . Weakness     Past Surgical History:  Procedure Laterality Date  . ANAL FISSURE REPAIR  10-12-2000  . COLONOSCOPY N/A 07/01/2012   Procedure: COLONOSCOPY;  Surgeon: Lafayette Dragon, MD;  [facility]: [facility];  Service: Endoscopy;  Laterality: N/A;  . CYSTOSCOPY WITH RETROGRADE PYELOGRAM, URETEROSCOPY AND STENT PLACEMENT Bilateral 02/26/2013   Procedure: CYSTOSCOPY WITH RETROGRADE PYELOGRAM, URETEROSCOPY AND STENT PLACEMENT;  Surgeon: Alexis Frock,  MD;  [facility]: [facility];  Service: Urology;  Laterality: Bilateral;  . HOLMIUM LASER APPLICATION Bilateral 16/96/7893   Procedure: HOLMIUM LASER APPLICATION;  Surgeon: Alexis Frock, MD;  Location: [facility];  Service: Urology;  Laterality: Bilateral;  . kidney stone removal     06/2011-stent placement  . LEFT MEDIAL FEMORAL CONDYLE DEBRIDEMENT & DRILLING/ REMOVAL LOOSE BODY  09-15-2003  . NASAL SEPTUM SURGERY  1985  . STONE EXTRACTION WITH BASKET  06/12/2011   Procedure: STONE EXTRACTION WITH BASKET;  Surgeon: Claybon Jabs, MD;  [facility]: [facility];  Service: Urology;  Laterality: Right;    There were no vitals filed for this visit.  Subjective Assessment - 05/24/18 1717    Patient Stated Goals  improve right hand function    Currently in Pain?  No/denies              Treatment:PWR! modified quadraped x 10 reps, min v.c for larger amplitude movements Therapist started checking progress towards goals. Reveiwed strategies for ADLS, and recommneds pt sits down for donning pants over legs for safety. Placing grooved pegs into pegboard with RUE, min v.c for larger amplitude movments. Dual tasking while amb, tossing ball and performing cognitive task. min v.c  OT Short Term Goals - 05/24/18 5188      OT SHORT TERM GOAL #1   Title  I with PD  specific HEP.    Time  4    Period  Weeks    Status  Achieved      OT SHORT TERM GOAL #2   Title  I with adapted strategies for ADLs/ IADLs.    Time  4    Period  Weeks    Status  On-going      OT SHORT TERM GOAL #3   Title  Pt will demonstrate improved RUE fucntional use for ADLs as evidenced by increasing box/ blocks score to 42 blocks    Baseline  RUE 37, LUE 47    Time  4    Period  Weeks    Status  On-going   44     OT SHORT TERM GOAL #4   Time  4        OT Long Term Goals - 05/24/18 0954      OT LONG TERM GOAL #1   Title  Pt will verbalize understanding of appropriate PD  community resources.    Status  Achieved      OT LONG TERM GOAL #2   Title  Pt will demonstrate improved fine motor coordination for ADLSas evidenced by decreasing RUE 9 hole peg test score to 36 secs or less.    Status  On-going      OT LONG TERM GOAL #3   Title  Pt will perform 3 button / unbutton in 60 secs or less    Status  Achieved   35.75 secs           Plan - 05/24/18 0949    Clinical Impression Statement  Pt is progressing towards goals and is making good progress.  Pt agrees with plans for d/c next week    Occupational Profile and client history currently impacting functional performance  PMH: PD, DM, CAD, HTN, hyperlipidemia, kidney tumor. Pt is retired and lives with his wife. He enjoys travel    Rehab Potential  Good    Current Impairments/barriers affecting progress:  tremor, bradykinesia    OT Frequency  2x / week    OT Duration  8 weeks    Plan  check remaining goals strategies for ADLs; discuss plan for d/c    Consulted and Agree with Plan of Care  Patient;Family member/caregiver       Patient will benefit from skilled therapeutic intervention in order to improve the following deficits and impairments:  Impaired flexibility, Decreased mobility, Decreased coordination, Decreased activity tolerance, Decreased endurance, Decreased range of motion, Decreased strength, Impaired UE functional use, Impaired tone, Decreased balance, Difficulty walking, Impaired perceived functional ability, Obesity  Visit Diagnosis: Other lack of coordination  Other symptoms and signs involving the nervous system  Other symptoms and signs involving the musculoskeletal system  Other abnormalities of gait and mobility    Problem List Patient Active Problem List   Diagnosis Date Noted  . Controlled type 2 diabetes mellitus with diabetic dermatitis, without long-term current use of insulin ([place]) 01/21/2018  . OSA treated with BiPAP 01/03/2017  . REM sleep behavior disorder  12/06/2016  . Uncontrolled secondary diabetes mellitus with stage 3 CKD (GFR 30-59) (HCC) 10/23/2016  . Medication management 10/23/2016  . Vitamin D deficiency 10/23/2016  . Parkinson disease ([place]) 01/04/2016  . Seasonal and perennial allergic rhinitis 06/21/2014  . Asthma, mild intermittent, well-controlled 06/21/2014  .  Pseudomonas aeruginosa colonization 10/08/2012  . CAD (coronary artery disease) 11/15/2011  . Hypertension 11/15/2011  . Hyperlipidemia 11/15/2011  . Morbid obesity with BMI of 40.0-44.9, adult ([place]) 11/15/2011    , 05/24/2018, 5:18 PM  [facility] [address] [place], [place], [address] Phone: [phone]   Fax:  [phone]  Name: Todd Rice MRN: [id number] Date of Birth: 25-Jul-1955

## 2018-05-24 NOTE — Therapy (Signed)
[facility] [address] [place], [place], [address] Phone: [phone]   Fax:  [phone]  Speech Language Pathology Treatment  Patient Details  Name: Todd Rice MRN: [id number] Date of Birth: 07/05/1955 Referring Provider (SLP): Willis, C.K., MD   Encounter Date: 05/24/2018  End of Session - 05/24/18 1233    Visit Number  7    Number of Visits  17    Date for SLP Re-Evaluation  06/28/18    SLP Start Time  0848    SLP Stop Time   0928    SLP Time Calculation (min)  40 min    Activity Tolerance  Patient tolerated treatment well       Past Medical History:  Diagnosis Date  . Adult BMI 50.0-59.9 kg/sq m    52.77  . Anal fissure   . Anxiety   . Diabetes mellitus without complication (HCC)    borderline  . GERD (gastroesophageal reflux disease)    30 years ago, maybe from peptic ulcer  . H/O: gout    STABLE  . Hepatic steatosis 10/01/11  . History of kidney stones   . Hyperlipidemia   . Hypertension   . Kidney tumor    right upper kidney tumor, monitoring  . OSA on CPAP    [facility]  . Parkinson disease (HCC) 01/04/2016  . REM sleep behavior disorder 12/06/2016  . Right ureteral stone   . Ureteral calculus, right 06/12/2011  . Vitamin D deficiency   . Weakness     Past Surgical History:  Procedure Laterality Date  . ANAL FISSURE REPAIR  10-12-2000  . COLONOSCOPY N/A 07/01/2012   Procedure: COLONOSCOPY;  Surgeon: Dora M Brodie, MD;  [facility];  Service: Endoscopy;  Laterality: N/A;  . CYSTOSCOPY WITH RETROGRADE PYELOGRAM, URETEROSCOPY AND STENT PLACEMENT Bilateral 02/26/2013   Procedure: CYSTOSCOPY WITH RETROGRADE PYELOGRAM, URETEROSCOPY AND STENT PLACEMENT;  Surgeon: Theodore Manny, MD;  [facility];  Service: Urology;  Laterality: Bilateral;  . HOLMIUM LASER APPLICATION Bilateral 02/26/2013   Procedure: HOLMIUM LASER APPLICATION;  Surgeon: Theodore Manny, MD;  [facility];   Service: Urology;  Laterality: Bilateral;  . kidney stone removal     06/2011-stent placement  . LEFT MEDIAL FEMORAL CONDYLE DEBRIDEMENT & DRILLING/ REMOVAL LOOSE BODY  09-15-2003  . NASAL SEPTUM SURGERY  1985  . STONE EXTRACTION WITH BASKET  06/12/2011   Procedure: STONE EXTRACTION WITH BASKET;  Surgeon: Mark C Ottelin, MD;  Location: [facility];  Service: Urology;  Laterality: Right;    There were no vitals filed for this visit.  Subjective Assessment - 05/24/18 0905    Subjective  Pt again arrived into ST room, and sat down, with WNL speech.            ADULT SLP TREATMENT - 05/24/18 0910      General Information   Behavior/Cognition  Alert;Cooperative;Pleasant mood      Treatment Provided   Treatment provided  Cognitive-Linquistic      Cognitive-Linquistic Treatment   Treatment focused on  Dysarthria    Skilled Treatment  SLP used loud /a/ to recalibrate pt's conversational volume. Pt averaged mid to upper 90s dB. 29 minutes mod complex/complex conversation in and outside ST room was WNL average (low 70s dB). Pt very pleased with ST and agrees with d/c at this time.       Assessment / Recommendations / Plan   Plan  Continue with current plan of care        Progression Toward Goals   Progression toward goals  Progressing toward goals         SLP Short Term Goals - 05/17/18 1048      SLP SHORT TERM GOAL #1   Title  pt will maintain loud /a/ average mid 90s dB with appropriate voicing over 4 sessions    Status  Achieved      SLP SHORT TERM GOAL #2   Title  pt will produce 20/20 sentences with average 70dB over two sessions    Status  Achieved      SLP SHORT TERM GOAL #3   Title  pt will demo simple conversation of 10 minutes with average 70dB over two sessions    Status  Achieved       SLP Long Term Goals - 05/24/18 1234      SLP LONG TERM GOAL #1   Title  pt will maintain loud /a/ at mid 90s dB with proper voicing over 6 sessions    Status   Partially Met      SLP LONG TERM GOAL #2   Title  pt will demo 10 minutes mod complex conversastion with average low 70sdB over two sessions    Status  Achieved      SLP LONG TERM GOAL #3   Title  pt will demo 15 minutes mod complex conversation with average low 70s dB outside ST room, over two sessions    Status  Achieved      SLP LONG TERM GOAL #4   Title  pt will demo 15 minutes mod complex conversation with abdominal breathing (AB) 75% of the time over two sessions    Status  Achieved       Plan - 05/24/18 1233    Clinical Impression Statement  Pt with cont'd excellent success with WNL conversational loudness in mod compelx conversation in and outside ST room. Skilled ST will be d/c'd at this time.    Treatment/Interventions  Compensatory strategies;Patient/family education;Functional tasks;Cueing hierarchy;Environmental controls;Internal/external aids;SLP instruction and feedback;Diet toleration management by SLP;Pharyngeal strengthening exercises;Aspiration precaution training    Potential to Achieve Goals  Good    SLP Home Exercise Plan  provided today (/a/ and abdominal breathing)    Consulted and Agree with Plan of Care  Patient       Patient will benefit from skilled therapeutic intervention in order to improve the following deficits and impairments:   Dysarthria and anarthria   SPEECH THERAPY DISCHARGE SUMMARY  Visits from Start of Care: 7  Current functional level related to goals / functional outcomes: Pt arrived to this course of ST with suboptimal volume in conversation and ended therapy with WNL volume in mod complex conversation for approx 25 minutes.  SLP ensured pt knew that performance of loud /a/ is essential for maintaining WNL speaking volume in conversation over the course of time.    Remaining deficits: None noted.   Education / Equipment: Loud /a/ necessary BID - 5 reps each set.   Plan: Patient agrees to discharge.  Patient goals were  met.(partially met one goal) Patient is being discharged due to meeting the stated rehab goals.  ?????       Problem List Patient Active Problem List   Diagnosis Date Noted  . Controlled type 2 diabetes mellitus with diabetic dermatitis, without long-term current use of insulin (HCC) 01/21/2018  . OSA treated with BiPAP 01/03/2017  . REM sleep behavior disorder 12/06/2016  . Uncontrolled secondary diabetes mellitus with stage 3 CKD (  GFR 30-59) ([place]) 10/23/2016  . Medication management 10/23/2016  . Vitamin D deficiency 10/23/2016  . Parkinson disease ([place]) 01/04/2016  . Seasonal and perennial allergic rhinitis 06/21/2014  . Asthma, mild intermittent, well-controlled 06/21/2014  . Pseudomonas aeruginosa colonization 10/08/2012  . CAD (coronary artery disease) 11/15/2011  . Hypertension 11/15/2011  . Hyperlipidemia 11/15/2011  . Morbid obesity with BMI of 40.0-44.9, adult ([place]) 11/15/2011    Caran Storck,CARL1/17/2020, 12:35 PM Garald Balding MS, [facility] [address] [place], [place], [address] Phone: [phone]   Fax:  [phone]   Name: Todd Rice MRN: [id number] Date of Birth: June 05, 1955

## 2018-05-24 NOTE — Therapy (Signed)
[facility] [address] [place], [place], [address] Phone: [phone]   Fax:  [phone]  Physical Therapy Treatment  Patient Details  Name: Todd Rice MRN: [id number] Date of Birth: 1956/01/25 Referring Provider (PT): Margette Fast   Encounter Date: 05/24/2018  PT End of Session - 05/24/18 0900    Visit Number  7    Number of Visits  10    Date for PT Re-Evaluation  06/17/18    Authorization Type  [employer]    PT Start Time  0804    PT Stop Time  0845    PT Time Calculation (min)  41 min    Activity Tolerance  Patient tolerated treatment well    Behavior During Therapy  [facility] for tasks assessed/performed       Past Medical History:  Diagnosis Date  . Adult BMI 50.0-59.9 kg/sq m    52.77  . Anal fissure   . Anxiety   . Diabetes mellitus without complication (HCC)    borderline  . GERD (gastroesophageal reflux disease)    30 years ago, maybe from peptic ulcer  . H/O: gout    STABLE  . Hepatic steatosis 10/01/11  . History of kidney stones   . Hyperlipidemia   . Hypertension   . Kidney tumor    right upper kidney tumor, monitoring  . OSA on CPAP    [facility]  . Parkinson disease ([place]) 01/04/2016  . REM sleep behavior disorder 12/06/2016  . Right ureteral stone   . Ureteral calculus, right 06/12/2011  . Vitamin D deficiency   . Weakness     Past Surgical History:  Procedure Laterality Date  . ANAL FISSURE REPAIR  10-12-2000  . COLONOSCOPY N/A 07/01/2012   Procedure: COLONOSCOPY;  Surgeon: Lafayette Dragon, MD;  [facility];  Service: Endoscopy;  Laterality: N/A;  . CYSTOSCOPY WITH RETROGRADE PYELOGRAM, URETEROSCOPY AND STENT PLACEMENT Bilateral 02/26/2013   Procedure: CYSTOSCOPY WITH RETROGRADE PYELOGRAM, URETEROSCOPY AND STENT PLACEMENT;  Surgeon: Alexis Frock, MD;  [facility];  Service: Urology;  Laterality: Bilateral;  . HOLMIUM LASER APPLICATION Bilateral 46/56/8127   Procedure:  HOLMIUM LASER APPLICATION;  Surgeon: Alexis Frock, MD;  [facility];  Service: Urology;  Laterality: Bilateral;  . kidney stone removal     06/2011-stent placement  . LEFT MEDIAL FEMORAL CONDYLE DEBRIDEMENT & DRILLING/ REMOVAL LOOSE BODY  09-15-2003  . NASAL SEPTUM SURGERY  1985  . STONE EXTRACTION WITH BASKET  06/12/2011   Procedure: STONE EXTRACTION WITH BASKET;  Surgeon: Claybon Jabs, MD;  Location: [facility];  Service: Urology;  Laterality: Right;    There were no vitals filed for this visit.  Subjective Assessment - 05/24/18 0808    Subjective  Denies changes or falls.    Patient Stated Goals  Pt's goals for therapy are to get stamina back, consistently everyday.    Currently in Pain?  No/denies       [facility] Adult PT Treatment/Exercise - 05/24/18 0001      Transfers   Transfers  Sit to Stand;Stand to Sit    Sit to Stand  6: Modified independent (Device/Increase time);Without upper extremity assist;From chair/3-in-1;From bed    Stand to Sit  6: Modified independent (Device/Increase time);Without upper extremity assist;To chair/3-in-1;To bed    Number of Reps  10 reps    Comments  On blue foam mat      Posture/Postural Control   Posture/Postural Control  Postural limitations  Postural Limitations  Forward head;Rounded Shoulders      High Level Balance   High Level Balance Activities  Other (comment)    High Level Balance Comments  On blue foam beam for side stepping, marching, standing with head turns/nods, standing tossing weighted ball.  Taps to 6" step alternating LE's then taps to 6", 12", 6" then floor alternating LE's-intermittent UE support.  Standing on blue foam, stepping one leg onto 6" step then stepping thru with other leg to tap seat of chair then back to foam.  Stepping up onto 6" step with single leg then alternating LE's x 20 reps total.      Knee/Hip Exercises: Aerobic   Stepper  Seated SciFit Stepper, Level 1.8, 2 extremities x 9  minutes, >80 RPM        PWR ([facility]) - 05/24/18 0857    PWR! Up  x20    PWR! Rock  [employer]! Twist  x20    PWR Step  x20    Basic 4 Flow  x 5 reps    Comments  Performed PWR! moves with boom whackers and blue ball on floor as target       PT Long Term Goals - 04/18/18 1516      PT LONG TERM GOAL #1   Title  Pt will be independent with Parkinson's specific HEP for improved balance and gait.  TARGET 06/07/18 (due to delayed scheduling with holidays)    Time  5    Period  Weeks    Status  New    Target Date  06/07/18      PT LONG TERM GOAL #2   Title  Pt will perform 5x sit<>stand in less than 11 seconds for improved transfer efficiency and safety.    Time  5    Period  Weeks    Status  New    Target Date  06/07/18      PT LONG TERM GOAL #3   Title  Pt will ambulate at least 1500 ft in 6 MWT, for improved gait endurance and efficiency.    Time  5    Period  Weeks    Status  New    Target Date  06/07/18      PT LONG TERM GOAL #4   Title  Pt will verbalize plans for continued community fitness upon d/c from PT.    Time  5    Period  Weeks    Status  New    Target Date  06/07/18            Plan - 05/24/18 0900    Clinical Impression Statement  Skilled session focused on high level balance, coordination, endurace and strengthening.  Minimal instability with SLS activities and compliant surfaces today.  Continues to need cues for best posture when performing tasks.  Continue PT per POC.    Rehab Potential  Good    PT Frequency  Other (comment)   1x/wk for 1 week, then 2x/wk for 4 weeks   PT Duration  --   total POC = 5 weeks   PT Treatment/Interventions  ADLs/Self Care Home Management;Therapeutic exercise;Therapeutic activities;Functional mobility training;Gait training;Balance training;Neuromuscular re-education;Patient/family education    PT Next Visit Plan  Per discussion with Mady Haagensen, PT, will likely check goals and d/c week of 05/27/2018    Consulted and  Agree with Plan of Care  Patient;Family member/caregiver       Patient will benefit from skilled therapeutic  intervention in order to improve the following deficits and impairments:  Abnormal gait, Decreased balance, Decreased endurance, Decreased mobility, Difficulty walking, Decreased strength, Impaired tone, Postural dysfunction  Visit Diagnosis: Muscle weakness (generalized)  Unsteadiness on feet  Other abnormalities of gait and mobility     Problem List Patient Active Problem List   Diagnosis Date Noted  . Controlled type 2 diabetes mellitus with diabetic dermatitis, without long-term current use of insulin ([place]) 01/21/2018  . OSA treated with BiPAP 01/03/2017  . REM sleep behavior disorder 12/06/2016  . Uncontrolled secondary diabetes mellitus with stage 3 CKD (GFR 30-59) (HCC) 10/23/2016  . Medication management 10/23/2016  . Vitamin D deficiency 10/23/2016  . Parkinson disease ([place]) 01/04/2016  . Seasonal and perennial allergic rhinitis 06/21/2014  . Asthma, mild intermittent, well-controlled 06/21/2014  . Pseudomonas aeruginosa colonization 10/08/2012  . CAD (coronary artery disease) 11/15/2011  . Hypertension 11/15/2011  . Hyperlipidemia 11/15/2011  . Morbid obesity with BMI of 40.0-44.9, adult ([place]) 11/15/2011    Narda Bonds, PTA [facility] 05/24/18 9:03 AM Phone: [phone] Fax: [facility] [address] [place], [place], [address] Phone: [phone]   Fax:  [phone]  Name: Todd Rice MRN: [id number] Date of Birth: [birth year]

## 2018-05-24 NOTE — Patient Instructions (Signed)
DO YOUR LOUD "AH"s THIS TIME! :)

## 2018-05-28 ENCOUNTER — Ambulatory Visit: Payer: 59 | Admitting: Physical Therapy

## 2018-05-28 ENCOUNTER — Encounter: Payer: Self-pay | Admitting: Physical Therapy

## 2018-05-28 ENCOUNTER — Encounter: Payer: 59 | Admitting: Speech Pathology

## 2018-05-28 ENCOUNTER — Ambulatory Visit: Payer: 59 | Admitting: Occupational Therapy

## 2018-05-28 DIAGNOSIS — R2689 Other abnormalities of gait and mobility: Secondary | ICD-10-CM

## 2018-05-28 DIAGNOSIS — R2681 Unsteadiness on feet: Secondary | ICD-10-CM

## 2018-05-28 DIAGNOSIS — R29818 Other symptoms and signs involving the nervous system: Secondary | ICD-10-CM

## 2018-05-28 DIAGNOSIS — R29898 Other symptoms and signs involving the musculoskeletal system: Secondary | ICD-10-CM

## 2018-05-28 DIAGNOSIS — M6281 Muscle weakness (generalized): Secondary | ICD-10-CM | POA: Diagnosis not present

## 2018-05-28 DIAGNOSIS — R293 Abnormal posture: Secondary | ICD-10-CM

## 2018-05-28 DIAGNOSIS — R278 Other lack of coordination: Secondary | ICD-10-CM

## 2018-05-28 NOTE — Therapy (Signed)
[facility] [address] [place], [place], [address] Phone: [phone]   Fax:  [phone]  Occupational Therapy Treatment  Patient Details  Name: Todd Rice MRN: [id number] Date of Birth: May 17, 1955 Referring Provider (OT): Dr. [name]   Encounter Date: 05/28/2018  OT End of Session - 05/28/18 1020    Visit Number  9    Number of Visits  17    Date for OT Re-Evaluation  06/09/18    Authorization Type  [employer]    Authorization Time Period  90 visits per year, new insurance since January    Authorization - Visit Number  6    OT Start Time  1020    OT Stop Time  1055    OT Time Calculation (min)  35 min       Past Medical History:  Diagnosis Date  . Adult BMI 50.0-59.9 kg/sq m    52.77  . Anal fissure   . Anxiety   . Diabetes mellitus without complication (HCC)    borderline  . GERD (gastroesophageal reflux disease)    30 years ago, maybe from peptic ulcer  . H/O: gout    STABLE  . Hepatic steatosis 10/01/11  . History of kidney stones   . Hyperlipidemia   . Hypertension   . Kidney tumor    right upper kidney tumor, monitoring  . OSA on CPAP    [facility]  . Parkinson disease ([place]) 01/04/2016  . REM sleep behavior disorder 12/06/2016  . Right ureteral stone   . Ureteral calculus, right 06/12/2011  . Vitamin D deficiency   . Weakness     Past Surgical History:  Procedure Laterality Date  . ANAL FISSURE REPAIR  10-12-2000  . COLONOSCOPY N/A 07/01/2012   Procedure: COLONOSCOPY;  Surgeon: Lafayette Dragon, MD;  Location: [facility];  Service: Endoscopy;  Laterality: N/A;  . CYSTOSCOPY WITH RETROGRADE PYELOGRAM, URETEROSCOPY AND STENT PLACEMENT Bilateral 02/26/2013   Procedure: CYSTOSCOPY WITH RETROGRADE PYELOGRAM, URETEROSCOPY AND STENT PLACEMENT;  Surgeon: Alexis Frock, MD;  Location: [facility];  Service: Urology;  Laterality: Bilateral;  . HOLMIUM LASER APPLICATION Bilateral 74/12/1446   Procedure:  HOLMIUM LASER APPLICATION;  Surgeon: Alexis Frock, MD;  Location: [facility];  Service: Urology;  Laterality: Bilateral;  . kidney stone removal     06/2011-stent placement  . LEFT MEDIAL FEMORAL CONDYLE DEBRIDEMENT & DRILLING/ REMOVAL LOOSE BODY  09-15-2003  . NASAL SEPTUM SURGERY  1985  . STONE EXTRACTION WITH BASKET  06/12/2011   Procedure: STONE EXTRACTION WITH BASKET;  Surgeon: Claybon Jabs, MD;  Location[facility];  Service: Urology;  Laterality: Right;    There were no vitals filed for this visit.  Subjective Assessment - 05/28/18 1057    Subjective   taking 2 pills 3x/day now.  I really haven't seen much difference from when I was diagnosed    Pertinent History  PMH: PD, DM, CAD, HTN, hyperlipidemia, kidney tumor. Pt is retired and lives with his wife. He enjoys travel    Currently in Pain?  No/denies             Treatment: Checked progress towards remaining goals and reviewed with pt/ wife. Dual tasking: ambulating while tossing ball and performing cognitive task, min v.c. Fine motor coordination task to place grooved pegs into pegboard with left and right UE's min difficulty, [employer] Hands basic 4 prior to task. Using bilateral UE's to stack and unstack blocks, min v.c for larger  amplitude movements.                OT Short Term Goals - 05/28/18 1032      OT SHORT TERM GOAL #1   Title  I with PD  specific HEP.    Time  4    Period  Weeks    Status  Achieved      OT SHORT TERM GOAL #2   Title  I with adapted strategies for ADLs/ IADLs.    Time  4    Period  Weeks    Status  Achieved      OT SHORT TERM GOAL #3   Title  Pt will demonstrate improved RUE fucntional use for ADLs as evidenced by increasing box/ blocks score to 42 blocks    Baseline  RUE 37, LUE 47    Time  4    Period  Weeks    Status  Achieved   44     OT SHORT TERM GOAL #4   Time  4        OT Long Term Goals - 05/28/18 1022      OT LONG TERM GOAL #1   Title   Pt will verbalize understanding of appropriate PD community resources.    Status  Achieved      OT LONG TERM GOAL #2   Title  Pt will demonstrate improved fine motor coordination for ADLSas evidenced by decreasing RUE 9 hole peg test score to 36 secs or less.    Status  Partially Met   36.29, 34.66     OT LONG TERM GOAL #3   Title  Pt will perform 3 button / unbutton in 60 secs or less    Status  Achieved      OT LONG TERM GOAL #4   Title  --    Status  --      OT LONG TERM GOAL #5   Title  Pt will write a short paragraph with good legibility and no significant decrease in letter size.    Status  Achieved            Plan - 05/28/18 1035    Clinical Impression Statement  Pt demonstrates excellent overall progress. He agrees with plans for d/c and he plans to return to for screens in 6 mons.    Rehab Potential  Good    Current Impairments/barriers affecting progress:  tremor, bradykinesia    OT Frequency  2x / week    OT Duration  8 weeks    OT Treatment/Interventions  Self-care/ADL training;Therapeutic exercise;Patient/family education;Neuromuscular education;Paraffin;Moist Heat;Aquatic Therapy;Fluidtherapy;Energy conservation;Therapeutic activities;Balance training;Functional Mobility Training;DME and/or AE instruction;Manual Therapy;Cryotherapy;Ultrasound;Contrast Bath    Plan   d/c OT    Consulted and Agree with Plan of Care  Patient;Family member/caregiver       Patient will benefit from skilled therapeutic intervention in order to improve the following deficits and impairments:  Impaired flexibility, Decreased mobility, Decreased coordination, Decreased activity tolerance, Decreased endurance, Decreased range of motion, Decreased strength, Impaired UE functional use, Impaired tone, Decreased balance, Difficulty walking, Impaired perceived functional ability, Obesity  Visit Diagnosis: Other lack of coordination  Other symptoms and signs involving the nervous  system  Other symptoms and signs involving the musculoskeletal system  Other abnormalities of gait and mobility  Muscle weakness (generalized)  Unsteadiness on feet  Abnormal posture    Problem List Patient Active Problem List   Diagnosis Date Noted  . Controlled type 2 diabetes mellitus  with diabetic dermatitis, without long-term current use of insulin ([place]) 01/21/2018  . OSA treated with BiPAP 01/03/2017  . REM sleep behavior disorder 12/06/2016  . Uncontrolled secondary diabetes mellitus with stage 3 CKD (GFR 30-59) (HCC) 10/23/2016  . Medication management 10/23/2016  . Vitamin D deficiency 10/23/2016  . Parkinson disease ([place]) 01/04/2016  . Seasonal and perennial allergic rhinitis 06/21/2014  . Asthma, mild intermittent, well-controlled 06/21/2014  . Pseudomonas aeruginosa colonization 10/08/2012  . CAD (coronary artery disease) 11/15/2011  . Hypertension 11/15/2011  . Hyperlipidemia 11/15/2011  . Morbid obesity with BMI of 40.0-44.9, adult ([place]) 11/15/2011   OCCUPATIONAL THERAPY DISCHARGE SUMMARY   Current functional level related to goals / functional outcomes: Pt made good overall progress. See goals for progress   Remaining deficits: Bradykinesia, tremor, rigidity, decreased balance, decreased coordination, abnormal posture   Education / Equipment: Pt/ wife were educated regarding:HEP, community resources, and adapted strategies for ADLs. Pt/ wife verbalize understanding of all education.  Plan: Patient agrees to discharge.  Patient goals were partially met. Patient is being discharged due to meeting the stated rehab goals.  ?????     Stewart Pimenta 05/28/2018, 1:10 PM Theone Murdoch, OTR/L [phone] Phone: [phone] 1:10 PM 05/28/18 [facility] [address] [place], [place], [address] Phone: [phone]   Fax:  [phone]  Name: CHRISOTPHER RIVERO MRN: [id number] Date of Birth:  1955/07/14

## 2018-05-29 NOTE — Therapy (Signed)
[facility] [address] [place], [place], [address] Phone: [phone]   Fax:  [phone]  Physical Therapy Treatment  Patient Details  Name: Todd Rice MRN: [id number] Date of Birth: June 09, 1955 Referring Provider (PT): Margette Fast   Encounter Date: 05/28/2018  PT End of Session - 05/29/18 1049    Visit Number  8    Number of Visits  10    Date for PT Re-Evaluation  06/17/18    Authorization Type  UMR    PT Start Time  1102    PT Stop Time  0349   d/c day-goals checked, HEP reviewed and session ended   PT Time Calculation (min)  33 min    Activity Tolerance  Patient tolerated treatment well    Behavior During Therapy  [facility] for tasks assessed/performed       Past Medical History:  Diagnosis Date  . Adult BMI 50.0-59.9 kg/sq m    52.77  . Anal fissure   . Anxiety   . Diabetes mellitus without complication (HCC)    borderline  . GERD (gastroesophageal reflux disease)    30 years ago, maybe from peptic ulcer  . H/O: gout    STABLE  . Hepatic steatosis 10/01/11  . History of kidney stones   . Hyperlipidemia   . Hypertension   . Kidney tumor    right upper kidney tumor, monitoring  . OSA on CPAP    [facility]  . Parkinson disease ([place]) 01/04/2016  . REM sleep behavior disorder 12/06/2016  . Right ureteral stone   . Ureteral calculus, right 06/12/2011  . Vitamin D deficiency   . Weakness     Past Surgical History:  Procedure Laterality Date  . ANAL FISSURE REPAIR  10-12-2000  . COLONOSCOPY N/A 07/01/2012   Procedure: COLONOSCOPY;  Surgeon: Lafayette Dragon, MD;  [facility];  Service: Endoscopy;  Laterality: N/A;  . CYSTOSCOPY WITH RETROGRADE PYELOGRAM, URETEROSCOPY AND STENT PLACEMENT Bilateral 02/26/2013   Procedure: CYSTOSCOPY WITH RETROGRADE PYELOGRAM, URETEROSCOPY AND STENT PLACEMENT;  Surgeon: Alexis Frock, MD;  [facility];  Service: Urology;  Laterality: Bilateral;  .  HOLMIUM LASER APPLICATION Bilateral 17/91/5056   Procedure: HOLMIUM LASER APPLICATION;  Surgeon: Alexis Frock, MD;  [facility];  Service: Urology;  Laterality: Bilateral;  . kidney stone removal     06/2011-stent placement  . LEFT MEDIAL FEMORAL CONDYLE DEBRIDEMENT & DRILLING/ REMOVAL LOOSE BODY  09-15-2003  . NASAL SEPTUM SURGERY  1985  . STONE EXTRACTION WITH BASKET  06/12/2011   Procedure: STONE EXTRACTION WITH BASKET;  Surgeon: Claybon Jabs, MD;  Location: [facility];  Service: Urology;  Laterality: Right;    There were no vitals filed for this visit.  Subjective Assessment - 05/28/18 1104    Subjective  No changes, no falls.  Plan to go to [employer] over Parkinson's meeting later today.    Patient Stated Goals  Pt's goals for therapy are to get stamina back, consistently everyday.    Currently in Pain?  No/denies                       [facility] Adult PT Treatment/Exercise - 05/29/18 1044      Transfers   Transfers  Sit to Stand;Stand to Sit    Sit to Stand  6: Modified independent (Device/Increase time);Without upper extremity assist;From chair/3-in-1;From bed    Five time sit to stand comments   9.97  Stand to Sit  6: Modified independent (Device/Increase time);Without upper extremity assist;To chair/3-in-1;To bed      Ambulation/Gait   Ambulation/Gait  Yes    Ambulation/Gait Assistance  7: Independent    Ambulation Distance (Feet)  1421 Feet   6MWT; 1600 ft total   Assistive device  None    Gait Pattern  Decreased arm swing - right;Decreased arm swing - left;Decreased step length - right;Decreased trunk rotation    Ambulation Surface  Level;Indoor    Gait velocity  3.51 ft/sec      Standardized Balance Assessment   Standardized Balance Assessment  Timed Up and Go Test      Timed Up and Go Test   TUG  Normal TUG    Normal TUG (seconds)  10.72      Self-Care   Self-Care  Other Self-Care Comments    Other Self-Care Comments   Discussed  plans for continued community fitness.  Pt reports planning to try the [employer] Class.  PT reiterates importance of consistent performance of HEP, walking, and PD-specific exercise classes as means of maintaining gains made in therapy.  Discussed d/c this visit and plans for return screen in 6 months.        Review of HEP and discussed progression of balance exercises for improved balance on compliant surfaces.  Balance Exercises - 05/29/18 1045      Balance Exercises: Standing   Standing Eyes Opened  Wide (BOA);Narrow base of support (BOS);Head turns;Foam/compliant surface;5 reps   Head nods   Standing Eyes Closed  Wide (BOA);Narrow base of support (BOS);Foam/compliant surface;5 reps;Head turns   Head nods   Tandem Stance  Eyes open;Foam/compliant surface;5 reps;Intermittent upper extremity support   Head turns, nods EO and EC   Other Standing Exercises  Reviewed pt's corner balance exercises and discussed progression of foot positions, partial tandem to tandem for progressive difficulty over time.             PT Long Term Goals - 05/28/18 1125      PT LONG TERM GOAL #1   Title  Pt will be independent with Parkinson's specific HEP for improved balance and gait.  TARGET 06/07/18 (due to delayed scheduling with holidays)    Time  5    Period  Weeks    Status  Achieved      PT LONG TERM GOAL #2   Title  Pt will perform 5x sit<>stand in less than 11 seconds for improved transfer efficiency and safety.    Time  5    Period  Weeks    Status  Achieved      PT LONG TERM GOAL #3   Title  Pt will ambulate at least 1500 ft in 6 MWT, for improved gait endurance and efficiency.    Baseline  1224 ft>1421 ft    Time  5    Period  Weeks    Status  Not Met      PT LONG TERM GOAL #4   Title  Pt will verbalize plans for continued community fitness upon d/c from PT.    Time  5    Period  Weeks    Status  Achieved            Plan - 05/29/18 1049    Clinical Impression  Statement  LTGs assessed today, with pt meeting LTG 1, 2 and 4.  LTG not met, but pt made progress in 6 minute walk test from 1241 ft to 1421  ft.  Pt is independent with HEP and verbalizes plans to join PD-specific exercise class.  Pt is appropriate for d/c this visit.    Rehab Potential  Good    PT Frequency  Other (comment)   1x/wk for 1 week, then 2x/wk for 4 weeks   PT Duration  --   total POC = 5 weeks   PT Treatment/Interventions  ADLs/Self Care Home Management;Therapeutic exercise;Therapeutic activities;Functional mobility training;Gait training;Balance training;Neuromuscular re-education;Patient/family education    PT Next Visit Plan  Discharge this visit.  Recommend return PT screen in 6 months.    Consulted and Agree with Plan of Care  Patient;Family member/caregiver    Family Member Consulted  wife       Patient will benefit from skilled therapeutic intervention in order to improve the following deficits and impairments:  Abnormal gait, Decreased balance, Decreased endurance, Decreased mobility, Difficulty walking, Decreased strength, Impaired tone, Postural dysfunction  Visit Diagnosis: Other abnormalities of gait and mobility  Unsteadiness on feet     Problem List Patient Active Problem List   Diagnosis Date Noted  . Controlled type 2 diabetes mellitus with diabetic dermatitis, without long-term current use of insulin ([place]) 01/21/2018  . OSA treated with BiPAP 01/03/2017  . REM sleep behavior disorder 12/06/2016  . Uncontrolled secondary diabetes mellitus with stage 3 CKD (GFR 30-59) (HCC) 10/23/2016  . Medication management 10/23/2016  . Vitamin D deficiency 10/23/2016  . Parkinson disease ([place]) 01/04/2016  . Seasonal and perennial allergic rhinitis 06/21/2014  . Asthma, mild intermittent, well-controlled 06/21/2014  . Pseudomonas aeruginosa colonization 10/08/2012  . CAD (coronary artery disease) 11/15/2011  . Hypertension 11/15/2011  . Hyperlipidemia 11/15/2011   . Morbid obesity with BMI of 40.0-44.9, adult ([place]) 11/15/2011    Maci Eickholt W. 05/29/2018, 10:52 AM Frazier Butt., PT  [facility] [address] [place], [place], [address] Phone: [phone]   Fax:  [phone]  Name: Todd Rice MRN: [id number] Date of Birth: October 31, 1955   PHYSICAL THERAPY DISCHARGE SUMMARY  Visits from Start of Care: 8  Current functional level related to goals / functional outcomes: PT Long Term Goals - 05/28/18 1125      PT LONG TERM GOAL #1   Title  Pt will be independent with Parkinson's specific HEP for improved balance and gait.  TARGET 06/07/18 (due to delayed scheduling with holidays)    Time  5    Period  Weeks    Status  Achieved      PT LONG TERM GOAL #2   Title  Pt will perform 5x sit<>stand in less than 11 seconds for improved transfer efficiency and safety.    Time  5    Period  Weeks    Status  Achieved      PT LONG TERM GOAL #3   Title  Pt will ambulate at least 1500 ft in 6 MWT, for improved gait endurance and efficiency.    Baseline  1224 ft>1421 ft    Time  5    Period  Weeks    Status  Not Met      PT LONG TERM GOAL #4   Title  Pt will verbalize plans for continued community fitness upon d/c from PT.    Time  5    Period  Weeks    Status  Achieved      Pt has met 3 of 4 LTGs.   Remaining deficits: Posture, balance, endurance   Education / Equipment: Educated in  HEP, community fitness options.  Plan: Patient agrees to discharge.  Patient goals were partially met. Patient is being discharged due to being pleased with the current functional level.  ?????Recommend return PT screen in 6 months due to progressive nature of disease.     Mady Haagensen, PT 05/29/18 10:54 AM Phone: [phone] Fax: [phone]

## 2018-05-30 ENCOUNTER — Ambulatory Visit: Payer: 59 | Admitting: Occupational Therapy

## 2018-05-30 ENCOUNTER — Other Ambulatory Visit: Payer: Self-pay | Admitting: Adult Health

## 2018-05-30 ENCOUNTER — Ambulatory Visit: Payer: 59

## 2018-05-30 ENCOUNTER — Ambulatory Visit: Payer: 59 | Admitting: Physical Therapy

## 2018-05-30 DIAGNOSIS — E119 Type 2 diabetes mellitus without complications: Secondary | ICD-10-CM

## 2018-05-30 MED FILL — metFORMIN HCL ER 500 MG TB2: 500 | 30 days supply | Qty: 120 | Fill #0

## 2018-06-03 MED FILL — ALLOPURINOL 300 MG TABS: 300 | 30 days supply | Qty: 30 | Fill #0

## 2018-06-03 MED FILL — VALSARTAN-HCTZ 320-25 MG TA: 320-25 | 30 days supply | Qty: 30 | Fill #3

## 2018-06-04 ENCOUNTER — Encounter: Payer: 59 | Admitting: Speech Pathology

## 2018-06-04 ENCOUNTER — Encounter: Payer: 59 | Admitting: Occupational Therapy

## 2018-06-04 ENCOUNTER — Ambulatory Visit: Payer: 59 | Admitting: Physical Therapy

## 2018-06-04 DIAGNOSIS — G4733 Obstructive sleep apnea (adult) (pediatric): Secondary | ICD-10-CM | POA: Diagnosis not present

## 2018-06-06 NOTE — Progress Notes (Signed)
Assessment and Plan: Todd Rice was seen today for follow-up.  Diagnoses and all orders for this visit:  Parkinson disease ([place]) Continue follow up  Depression, major, recurrent, in partial remission ([place]) Will stop wellbutrin and see how he does  Hypercalcemia -     COMPLETE METABOLIC PANEL WITH GFR -     Parathyroid Hormone, Intact w/Ca -     VITAMIN D 25 Hydroxy (Vit-D Deficiency, Fractures)   HPI 63 y.o.male presents for follow up for addition of wellbutrin. States he was not having any side effects from the wellbutrin, his sinemet was increased and he did PT which he states has helped. He is getting up, showering and doing more now.   He is not on any calcium supplements or tums. He is on vitamin D 10,000 daily.  Lab Results  Component Value Date   VD25OH 28 (L) 01/21/2018   Pt was dx with hypercalcemia at least since 2009.  PTH levels were inappropriately normal.  He has history of kidney stones, last one was 3 years ago. No falls, fractures.    Lab Results  Component Value Date   GFRNONAA 61 04/25/2018    BMI is Body mass index is 45.92 kg/m., he is working on diet and exercise. Wt Readings from Last 3 Encounters:  06/10/18 293 lb 3.2 oz (133 kg)  05/17/18 293 lb (132.9 kg)  04/25/18 294 lb 6.4 oz (133.5 kg)     Past Medical History:  Diagnosis Date  . Adult BMI 50.0-59.9 kg/sq m    52.77  . Anal fissure   . Anxiety   . Diabetes mellitus without complication (HCC)    borderline  . GERD (gastroesophageal reflux disease)    30 years ago, maybe from peptic ulcer  . H/O: gout    STABLE  . Hepatic steatosis 10/01/11  . History of kidney stones   . Hyperlipidemia   . Hypertension   . Kidney tumor    right upper kidney tumor, monitoring  . OSA on CPAP    [facility]  . Parkinson disease ([place]) 01/04/2016  . REM sleep behavior disorder 12/06/2016  . Right ureteral stone   . Ureteral calculus, right 06/12/2011  . Vitamin D deficiency   . Weakness       Allergies  Allergen Reactions  . Ciprofloxacin Hives  . Ceftriaxone Hives    Current Outpatient Medications on File Prior to Visit  Medication Sig  . allopurinol (ZYLOPRIM) 300 MG tablet TAKE 1 TABLET BY MOUTH ONCE DAILY  . ALPRAZolam (XANAX) 0.5 MG tablet Take 1 tablet (0.5 mg total) by mouth 3 (three) times daily.  Marland Kitchen aspirin EC 81 MG tablet Take 81 mg by mouth at bedtime.  Marland Kitchen buPROPion (WELLBUTRIN XL) 150 MG 24 hr tablet Take 1 tablet (150 mg total) by mouth every morning.  . carbidopa-levodopa (SINEMET IR) 25-100 MG tablet Take 2 tablets by mouth 3 (three) times daily.  Marland Kitchen COLCRYS 0.6 MG tablet Take 1 tablet (0.6 mg total) by mouth 2 (two) times daily.  . metFORMIN (GLUCOPHAGE-XR) 500 MG 24 hr tablet TAKE 2 TABLETS BY MOUTH 2 TIMES A DAY FOR DIABETES  . pramipexole (MIRAPEX) 0.75 MG tablet Take 1 tablet (0.75 mg total) by mouth 3 (three) times daily.  . rosuvastatin (CRESTOR) 40 MG tablet Take 1 tablet (40 mg total) by mouth daily.  . sitaGLIPtin (JANUVIA) 100 MG tablet TAKE 1 TABLET BY MOUTH ONCE DAILY FOR DIABETES  . valsartan-hydrochlorothiazide (DIOVAN-HCT) 320-25 MG tablet Take 1 tablet  by mouth daily.  . VENTOLIN HFA 108 (90 Base) MCG/ACT inhaler INHALE 2 PUFFS INTO THE LUNGS EVERY 6 HOURS AS NEEDED FOR WHEEZING OR SHORTNESS OF BREATH.  . vitamin C (ASCORBIC ACID) 500 MG tablet Take 1 tablet (500 mg total) by mouth 3 (three) times daily.  . Vitamin D, Ergocalciferol, (DRISDOL) 50000 units CAPS capsule Take 50,000 Units by mouth every 7 (seven) days.   No current facility-administered medications on file prior to visit.     ROS: all negative except above.   Physical Exam: Filed Weights   06/10/18 1033  Weight: 293 lb 3.2 oz (133 kg)   BP 124/86   Pulse 76   Temp 98.6 F (37 C)   Ht 5\' 7"  (1.702 m)   Wt 293 lb 3.2 oz (133 kg)   SpO2 98%   BMI 45.92 kg/m  General Appearance: Well nourished, in no apparent distress. Eyes: PERRLA, EOMs, conjunctiva no swelling or  erythema Sinuses: No Frontal/maxillary tenderness ENT/Mouth: Ext aud canals clear, TMs without erythema, bulging. No erythema, swelling, or exudate on post pharynx.  Tonsils not swollen or erythematous. Hearing normal.  Neck: Supple, thyroid normal.  Respiratory: Respiratory effort normal, BS equal bilaterally without rales, rhonchi, wheezing or stridor.  Cardio: RRR with no MRGs. Brisk peripheral pulses without edema.  Abdomen: Soft, + BS.  Non tender, no guarding, rebound, hernias, masses. Lymphatics: Non tender without lymphadenopathy.  Musculoskeletal: Full ROM, 5/5 strength, normal gait.  Skin: Warm, dry without rashes, lesions, ecchymosis.  Neuro: Cranial nerves intact. Normal muscle tone, no cerebellar symptoms. Sensation intact.  Psych: Awake and oriented X 3, normal affect, Insight and Judgment appropriate.     Vicie Mutters, PA-C 10:39 AM [facility]

## 2018-06-07 ENCOUNTER — Ambulatory Visit: Payer: 59 | Admitting: Physical Therapy

## 2018-06-07 ENCOUNTER — Encounter: Payer: 59 | Admitting: Occupational Therapy

## 2018-06-10 ENCOUNTER — Ambulatory Visit: Payer: 59 | Admitting: Physician Assistant

## 2018-06-10 ENCOUNTER — Encounter: Payer: Self-pay | Admitting: Physician Assistant

## 2018-06-10 ENCOUNTER — Ambulatory Visit (HOSPITAL_COMMUNITY)
Admission: RE | Admit: 2018-06-10 | Discharge: 2018-06-10 | Disposition: A | Discharge status: Home / Self Care | Payer: 59 | Source: Ambulatory Visit | Attending: Physician Assistant | Admitting: Physician Assistant

## 2018-06-10 VITALS — BP 124/86 | HR 76 | Temp 98.6°F | Ht 67.0 in | Wt 293.2 lb

## 2018-06-10 DIAGNOSIS — R05 Cough: Secondary | ICD-10-CM

## 2018-06-10 DIAGNOSIS — I700 Atherosclerosis of aorta: Secondary | ICD-10-CM | POA: Insufficient documentation

## 2018-06-10 DIAGNOSIS — G20 Parkinson's disease: Secondary | ICD-10-CM | POA: Diagnosis not present

## 2018-06-10 DIAGNOSIS — R059 Cough, unspecified: Secondary | ICD-10-CM

## 2018-06-10 DIAGNOSIS — F3341 Major depressive disorder, recurrent, in partial remission: Secondary | ICD-10-CM | POA: Diagnosis not present

## 2018-06-10 NOTE — Patient Instructions (Addendum)
INFORMATION ABOUT YOUR XRAY  [facility] [place], go to radiology and give them your name. They will have the order and take you back. You do not any paper work, I should get the result back today or tomorrow. This order is good for a year.    Stop the wellbutrin for now and see how you do with motivation and depression  If you feel that you need it in the future than just message on my chart or call and I can send it back to you  WATER IS IMPORTANT  Being dehydrated can hurt your kidneys, cause fatigue, headaches, muscle aches, joint pain, and dry skin/nails so please increase your fluids.   Drink 80-100 oz a day of water, measure it out! Eat 3 meals a day, have to do breakfast, eat protein- hard boiled eggs, protein bar like nature valley protein bar, greek yogurt like oikos triple zero, chobani 100, or light n fit greek  Can check out plantnanny app on your phone to help you keep track of your water     Google mindful eating and here are some tips and tricks below.   Rate your hunger before you eat on a scale of 1-10, try to eat closer to a 6 or higher. And if you are at below that, why are you eating? Slow down and listen to your body.

## 2018-06-11 LAB — COMPLETE METABOLIC PANEL WITH GFR
AG Ratio: 1.2 (calc) (ref 1.0–2.5)
ALKALINE PHOSPHATASE (APISO): 51 U/L (ref 35–144)
ALT: 18 U/L (ref 9–46)
AST: 19 U/L (ref 10–35)
Albumin: 4.1 g/dL (ref 3.6–5.1)
BILIRUBIN TOTAL: 0.4 mg/dL (ref 0.2–1.2)
BUN: 21 mg/dL (ref 7–25)
CHLORIDE: 101 mmol/L (ref 98–110)
CO2: 24 mmol/L (ref 20–32)
Calcium: 10.3 mg/dL (ref 8.6–10.3)
Creat: 1.19 mg/dL (ref 0.70–1.25)
GFR, Est African American: 74 mL/min/{1.73_m2} (ref >=60)
GFR, Est Non African American: 64 mL/min/{1.73_m2} (ref >=60)
Globulin: 3.3 g/dL (calc) (ref 1.9–3.7)
Glucose, Bld: 115 mg/dL — ABNORMAL HIGH (ref 65–99)
Potassium: 4.1 mmol/L (ref 3.5–5.3)
Sodium: 138 mmol/L (ref 135–146)
Total Protein: 7.4 g/dL (ref 6.1–8.1)

## 2018-06-11 LAB — PTH, INTACT AND CALCIUM
Calcium: 10.3 mg/dL (ref 8.6–10.3)
PTH: 14 pg/mL (ref 14–64)

## 2018-06-11 LAB — VITAMIN D 25 HYDROXY (VIT D DEFICIENCY, FRACTURES): Vit D, 25-Hydroxy: 61 ng/mL (ref 30–100)

## 2018-06-19 MED FILL — ALPRAZolam 0.5 MG TABS: 0.5 | 30 days supply | Qty: 90 | Fill #1

## 2018-07-01 MED FILL — VALSARTAN-HCTZ 320-25 MG TA: 320-25 | 30 days supply | Qty: 30 | Fill #4

## 2018-07-01 MED FILL — ALLOPURINOL 300 MG TABS: 300 | 30 days supply | Qty: 30 | Fill #1

## 2018-07-01 MED FILL — metFORMIN HCL ER 500 MG TB2: 500 | 30 days supply | Qty: 120 | Fill #0

## 2018-07-02 ENCOUNTER — Ambulatory Visit: Payer: 59 | Admitting: Neurology

## 2018-07-10 MED FILL — JANUVIA 100 MG TABLET: 100 | 30 days supply | Qty: 30 | Fill #1

## 2018-07-22 ENCOUNTER — Encounter: Payer: Self-pay | Admitting: Neurology

## 2018-07-24 ENCOUNTER — Ambulatory Visit: Payer: 59 | Admitting: Neurology

## 2018-07-24 ENCOUNTER — Other Ambulatory Visit: Payer: Self-pay

## 2018-07-24 ENCOUNTER — Encounter: Payer: Self-pay | Admitting: Neurology

## 2018-07-24 VITALS — BP 158/97 | HR 75 | Temp 96.7°F | Ht 67.0 in | Wt 293.0 lb

## 2018-07-24 DIAGNOSIS — G4733 Obstructive sleep apnea (adult) (pediatric): Secondary | ICD-10-CM | POA: Diagnosis not present

## 2018-07-24 DIAGNOSIS — Z9989 Dependence on other enabling machines and devices: Secondary | ICD-10-CM

## 2018-07-24 NOTE — Progress Notes (Signed)
PATIENT: Todd Rice                     [facility] DOB: [birth year]  REASON FOR VISIT: follow up HISTORY FROM: patient  HISTORY OF PRESENT ILLNESS:    RV from  07/24/18 , I have the pleasure of meeting Todd Rice today, I mean by 63 year old Caucasian right-handed gentleman with a history of OSA treated on CPAP.  This is a routine follow-up for this patient who has been a highly compliant CPAP user.  He has used the machine 100% of the last 30 days and for 97% of these time over 4 hours consecutively there was only 1 day where he was a little short for 20 years of February.  He is using an air curve ASV machine with a maximum inspiratory pressure of 17 a minimum pressure support of 10 and an EPAP of 4 cmH2O.  He has only 2.4 apneas per hour left and most of these are obstructive.  He does have some massive air leakage, through the mouth-this is an air leak is much more likely in a Parkinson's patient. He feels most mornings refreshed and restored, some nights without interruption.    Todd Rice is a 62 year old male with a history of obstructive sleep apnea on CPAP.  He returns today for follow-up.  His download indicates that he use his machine 30 out of 30 days for compliance of 100%.  He uses machine greater than 4 hours 28 out of 30 days for compliance of 93%.  On average he uses his machine 6 hours and 45 minutes.  His residual AHI is 2.4.  He is on a maximum inspiratory pressure of 17 cm of water and minimum expiratory pressure of 10 cm of water with pressure support of 4 cm of water.  His leak in the 95th percentile is 98 L/min.  He states that often times during the night his mask will ride up into his mouth potentially causing a leak.  He reports that he is tried tightening the straps but continues to rise into his mouth.  He returns today for evaluation.  HISTORY copied from Dr. Edwena Felty note: I had the pleasure to day of seeing Todd Rice again, wed  undergone a split night polysomnography at [facility] on 02/24/2016. He was still diagnosed with a high degree of apnea his AHI was 33.6 he slept all night in supine position and he did not enter REM sleep. Oxygen nadir was 88% is only 2 minutes of desaturation and remarkably this patient, who suffers from Parkinson's disease, did not have periodic limb movements. He was titrated to CPAP up to 18 cm water pressure without resolution of apnea. The medical technologist changed him to a BiPAP setting which did resolve apnea at a setting of 20/16 cm water overall he did not sleep very well in the laboratory environment but he would finally able to find a mask that seem to fit him well-  an [employer]. I'm able today to see his compliance download which has a remarkable 100% of use for the last 30 days with 75% compliance for over 4 hours of nightly use consecutively. His minimum inspiratory pressure is 17 and expiratory pressure 10 cm water, 4 cm pressure support, residual AHI is 2.1 which is a very good resolution. There were no treatment emergent central apneas on the setting.  REVIEW OF SYSTEMS: Out of a complete 14 system  review of symptoms, the patient complains only of the following symptoms, and all other reviewed systems are negative.   The patient reports a more sound sleep is a more refreshing and restorative quality.  He endorsed the Epworth Sleepiness Scale at 2 points out of 24, the fatigue severity score was not endorsed, the depression scale was endorsed at 1 out of 15 points and is not indicative of clinical depression. The patient's daytime routines have not changed, his bedtimes mealtimes.  He is currently taking medication 3 times a day for Parkinson's disease. Vivid dreams, "crazy" - but he likes the dreams.  He remains obese.   ALLERGIES: Allergies  Allergen Reactions  . Ciprofloxacin Hives  . Ceftriaxone Hives    HOME MEDICATIONS: Outpatient Medications Prior  to Visit  Medication Sig Dispense Refill  . allopurinol (ZYLOPRIM) 300 MG tablet TAKE 1 TABLET BY MOUTH ONCE DAILY 90 tablet 3  . ALPRAZolam (XANAX) 0.5 MG tablet Take 1 tablet (0.5 mg total) by mouth 3 (three) times daily. 90 tablet 2  . aspirin EC 81 MG tablet Take 81 mg by mouth at bedtime.    Marland Kitchen buPROPion (WELLBUTRIN XL) 150 MG 24 hr tablet Take 1 tablet (150 mg total) by mouth every morning. 30 tablet 2  . carbidopa-levodopa (SINEMET IR) 25-100 MG tablet Take 2 tablets by mouth 3 (three) times daily. 540 tablet 2  . COLCRYS 0.6 MG tablet Take 1 tablet (0.6 mg total) by mouth 2 (two) times daily. 180 tablet 12  . metFORMIN (GLUCOPHAGE-XR) 500 MG 24 hr tablet TAKE 2 TABLETS BY MOUTH 2 TIMES A DAY FOR DIABETES 360 tablet 3  . pramipexole (MIRAPEX) 0.75 MG tablet Take 1 tablet (0.75 mg total) by mouth 3 (three) times daily. 270 tablet 2  . rosuvastatin (CRESTOR) 40 MG tablet Take 1 tablet (40 mg total) by mouth daily. 90 tablet 1  . sitaGLIPtin (JANUVIA) 100 MG tablet TAKE 1 TABLET BY MOUTH ONCE DAILY FOR DIABETES 90 tablet 1  . valsartan-hydrochlorothiazide (DIOVAN-HCT) 320-25 MG tablet Take 1 tablet by mouth daily. 90 tablet 1  . VENTOLIN HFA 108 (90 Base) MCG/ACT inhaler INHALE 2 PUFFS INTO THE LUNGS EVERY 6 HOURS AS NEEDED FOR WHEEZING OR SHORTNESS OF BREATH. 48 g 3  . vitamin C (ASCORBIC ACID) 500 MG tablet Take 1 tablet (500 mg total) by mouth 3 (three) times daily. 21 tablet 0  . Vitamin D, Ergocalciferol, (DRISDOL) 50000 units CAPS capsule Take 50,000 Units by mouth every 7 (seven) days.     No facility-administered medications prior to visit.     PAST MEDICAL HISTORY: Past Medical History:  Diagnosis Date  . Adult BMI 50.0-59.9 kg/sq m    52.77  . Anal fissure   . Anxiety   . Diabetes mellitus without complication (HCC)    borderline  . GERD (gastroesophageal reflux disease)    30 years ago, maybe from peptic ulcer  . H/O: gout    STABLE  . Hepatic steatosis 10/01/11  .  History of kidney stones   . Hyperlipidemia   . Hypertension   . Kidney tumor    right upper kidney tumor, monitoring  . OSA on CPAP    [facility]  . Parkinson disease ([place]) 01/04/2016  . REM sleep behavior disorder 12/06/2016  . Right ureteral stone   . Ureteral calculus, right 06/12/2011  . Vitamin D deficiency   . Weakness     PAST SURGICAL HISTORY: Past Surgical History:  Procedure Laterality Date  .  ANAL FISSURE REPAIR  10-12-2000  . COLONOSCOPY N/A 07/01/2012   Procedure: COLONOSCOPY;  Surgeon: Lafayette Dragon, MD;  Location: [facility];  Service: Endoscopy;  Laterality: N/A;  . CYSTOSCOPY WITH RETROGRADE PYELOGRAM, URETEROSCOPY AND STENT PLACEMENT Bilateral 02/26/2013   Procedure: CYSTOSCOPY WITH RETROGRADE PYELOGRAM, URETEROSCOPY AND STENT PLACEMENT;  Surgeon: Alexis Frock, MD;  Location: [facility];  Service: Urology;  Laterality: Bilateral;  . HOLMIUM LASER APPLICATION Bilateral 86/76/1950   Procedure: HOLMIUM LASER APPLICATION;  Surgeon: Alexis Frock, MD;  Location: [facility];  Service: Urology;  Laterality: Bilateral;  . kidney stone removal     06/2011-stent placement  . LEFT MEDIAL FEMORAL CONDYLE DEBRIDEMENT & DRILLING/ REMOVAL LOOSE BODY  09-15-2003  . NASAL SEPTUM SURGERY  1985  . STONE EXTRACTION WITH BASKET  06/12/2011   Procedure: STONE EXTRACTION WITH BASKET;  Surgeon: Claybon Jabs, MD;  Location: [facility];  Service: Urology;  Laterality: Right;    FAMILY HISTORY: Family History  Problem Relation Age of Onset  . Heart disease Mother        Atrial fibrillation  . Hypertension Mother   . Hyperlipidemia Mother   . Lung cancer Father        was a smoker  . Cancer Father        lung  . Hyperlipidemia Brother   . Heart disease Maternal Aunt   . Hyperlipidemia Maternal Aunt   . Hypertension Maternal Aunt   . Stroke Maternal Aunt   . Heart disease Paternal Grandmother   . Hyperlipidemia Paternal Grandmother     SOCIAL HISTORY: Social  History   Socioeconomic History  . Marital status: Married    Spouse name: Not on file  . Number of children: 0  . Years of education: Not on file  . Highest education level: Not on file  Occupational History  . Not on file  Social Needs  . Financial resource strain: Not on file  . Food insecurity:    Worry: Not on file    Inability: Not on file  . Transportation needs:    Medical: Not on file    Non-medical: Not on file  Tobacco Use  . Smoking status: Never Smoker  . Smokeless tobacco: Never Used  Substance and Sexual Activity  . Alcohol use: No  . Drug use: No    Comment: QUIT SMOKING " POT" 40 YRS AGO  . Sexual activity: Not on file  Lifestyle  . Physical activity:    Days per week:  5 days a week for 30 minutes or more.     Minutes per session: Daily stretching exercises.                                                                 Other Topics Concern  . Not on file  Social History Narrative   Right-handed.   No caffeine use.   Lives at home with his wife.      PHYSICAL EXAM  Vitals:   07/24/18 1405  BP: (!) 158/97  Pulse: 75  Temp: (!) 96.7 F (35.9 C)  Weight: 293 lb (132.9 kg)  Height: 5\' 7"  (1.702 m)   Body mass index is 45.89 kg/m.  Generalized: Well developed, in no acute distress   Neurological  examination  Mentation: Alert oriented to time, place, history taking. Follows all commands speech and language fluent Cranial nerve II-XII: Pupils were equal round reactive to light. Extraocular movements were full, visual field were full on confrontational test. Facial sensation and strength were normal. Uvula tongue midline. Head turning and shoulder shrug  were normal and symmetric. Motor: The motor testing reveals 5 over 5 strength of all 4 extremities. Good symmetric motor tone is noted throughout.  Sensory: Sensory testing is intact to soft touch on all 4 extremities. No evidence of extinction is noted.  Coordination: Cerebellar  testing reveals good finger-nose-finger and heel-to-shin bilaterally.  Gait and station: Gait is normal.  Reflexes: Deep tendon reflexes are symmetric and normal bilaterally.   DIAGNOSTIC DATA (LABS, IMAGING, TESTING) - I reviewed patient records, labs, notes, testing and imaging myself where available.  Lab Results  Component Value Date   WBC 8.9 04/25/2018   HGB 15.3 04/25/2018   HCT 44.6 04/25/2018   MCV 82.0 04/25/2018   PLT 190 04/25/2018      Component Value Date/Time   NA 138 06/10/2018 1102   K 4.1 06/10/2018 1102   CL 101 06/10/2018 1102   CO2 24 06/10/2018 1102   GLUCOSE 115 (H) 06/10/2018 1102   BUN 21 06/10/2018 1102   CREATININE 1.19 06/10/2018 1102   CALCIUM 10.3 06/10/2018 1102   CALCIUM 10.3 06/10/2018 1102   PROT 7.4 06/10/2018 1102   ALBUMIN 3.8 10/24/2016 1601   AST 19 06/10/2018 1102   ALT 18 06/10/2018 1102   ALKPHOS 58 10/24/2016 1601   BILITOT 0.4 06/10/2018 1102   GFRNONAA 64 06/10/2018 1102   GFRAA 74 06/10/2018 1102   Lab Results  Component Value Date   CHOL 188 04/25/2018   HDL 34 (L) 04/25/2018   LDLCALC  04/25/2018     Comment:     . LDL cholesterol not calculated. Triglyceride levels greater than 400 mg/dL invalidate calculated LDL results. . Reference range: <100 . Desirable range <100 mg/dL for primary prevention;   <70 mg/dL for patients with CHD or diabetic patients  with > or = 2 CHD risk factors. Marland Kitchen LDL-C is now calculated using the Martin-Hopkins  calculation, which is a validated novel method providing  better accuracy than the Friedewald equation in the  estimation of LDL-C.  Cresenciano Genre et al. Annamaria Helling. 1694;503(88): 2061-2068  (http://education.[employer].com/faq/FAQ164)    TRIG 410 (H) 04/25/2018   CHOLHDL 5.5 (H) 04/25/2018   Lab Results  Component Value Date   HGBA1C 7.4 (H) 04/25/2018   Lab Results  Component Value Date   VITAMINB12 570 01/03/2017   Lab Results  Component Value Date   TSH 3.62  04/25/2018      ASSESSMENT AND PLAN 63 y.o. year old male  has a past medical history of Adult BMI 50.0-59.9 kg/sq m, Anal fissure, Anxiety, Diabetes mellitus without complication ([place]), GERD (gastroesophageal reflux disease), H/O: gout, Hepatic steatosis (10/01/11), History of kidney stones, Hyperlipidemia, Hypertension, Kidney tumor, OSA on CPAP, Parkinson disease ([place]) (01/04/2016), REM sleep behavior disorder (12/06/2016), Right ureteral stone, Ureteral calculus, right (06/12/2011), Vitamin D deficiency, and Weakness. here with:  1. OSA on CPAP  The patient CPAP download shows good compliance but the patient has a high oral Air leak.    As in order to his DME company for mask refitting. He is advised that if his symptoms worsen or he develops new symptoms he should let us know.   He will follow-up in 12 months for seep  clinic, and continues ot see Dr [name] for PD>   I spent  20 minutes with the patient. 50% of this time was spent reviewing CPAP download and medication changes, PD progression and effects on sleep quality, fatigue level.   07/24/2018, 2:06 PM [facility] [address] [place], [place] [address] [phone]

## 2018-07-31 MED FILL — metFORMIN HCL ER 500 MG TB2: 500 | 30 days supply | Qty: 120 | Fill #1

## 2018-07-31 MED FILL — CARBIDOPA/LEVO 25/100 TAB: 25-100 | 30 days supply | Qty: 180 | Fill #1

## 2018-07-31 MED FILL — VALSARTAN-HCTZ 320-25 MG TA: 320-25 | 30 days supply | Qty: 30 | Fill #5

## 2018-08-07 MED FILL — ALLOPURINOL 300 MG TABS: 300 | 30 days supply | Qty: 30 | Fill #2

## 2018-08-07 MED FILL — JANUVIA 100 MG TABLET: 100 | 30 days supply | Qty: 30 | Fill #2

## 2018-08-09 DIAGNOSIS — D4101 Neoplasm of uncertain behavior of right kidney: Secondary | ICD-10-CM | POA: Diagnosis not present

## 2018-08-09 DIAGNOSIS — R972 Elevated prostate specific antigen [PSA]: Secondary | ICD-10-CM | POA: Diagnosis not present

## 2018-08-14 DIAGNOSIS — D4101 Neoplasm of uncertain behavior of right kidney: Secondary | ICD-10-CM | POA: Diagnosis not present

## 2018-08-14 DIAGNOSIS — K862 Cyst of pancreas: Secondary | ICD-10-CM | POA: Diagnosis not present

## 2018-08-14 DIAGNOSIS — K5730 Diverticulosis of large intestine without perforation or abscess without bleeding: Secondary | ICD-10-CM | POA: Diagnosis not present

## 2018-08-14 DIAGNOSIS — K760 Fatty (change of) liver, not elsewhere classified: Secondary | ICD-10-CM | POA: Diagnosis not present

## 2018-08-19 DIAGNOSIS — D4101 Neoplasm of uncertain behavior of right kidney: Secondary | ICD-10-CM | POA: Diagnosis not present

## 2018-08-19 DIAGNOSIS — R972 Elevated prostate specific antigen [PSA]: Secondary | ICD-10-CM | POA: Diagnosis not present

## 2018-08-19 DIAGNOSIS — N3020 Other chronic cystitis without hematuria: Secondary | ICD-10-CM | POA: Diagnosis not present

## 2018-08-19 LAB — POC URINALSYSI DIPSTICK (AUTOMATED)
Bilirubin, UA: NEGATIVE
Blood, UA: NEGATIVE
Ketones, UA: NEGATIVE
Nitrite, UA: NEGATIVE
Protein, UA: NEGATIVE
Spec Grav, UA: 1.015 (ref 1.010–1.025)
Urobilinogen, UA: 0.2 E.U./dL
pH, UA: 6 (ref 5.0–8.0)

## 2018-08-28 ENCOUNTER — Other Ambulatory Visit: Payer: Self-pay

## 2018-08-28 ENCOUNTER — Ambulatory Visit (INDEPENDENT_AMBULATORY_CARE_PROVIDER_SITE_OTHER): Payer: 59 | Admitting: Gastroenterology

## 2018-08-28 ENCOUNTER — Encounter: Payer: Self-pay | Admitting: Gastroenterology

## 2018-08-28 VITALS — BP 134/82 | Ht 67.0 in | Wt 293.0 lb

## 2018-08-28 DIAGNOSIS — K862 Cyst of pancreas: Secondary | ICD-10-CM | POA: Diagnosis not present

## 2018-08-28 NOTE — Progress Notes (Signed)
This service was provided via virtual visit.  We tried audio and visual however that was not successful and so we decided to do a phone only visit.  The patient was located at home.  I was located in my office.  The patient did consent to this virtual visit and is aware of possible charges through their insurance for this visit.  He is a new patient to me, he was referred by Dr Phebe Colla.  My certified medical assistant, Grace Bushy, contributed to this visit by contacting the patient by phone 1 or 2 business days prior to the appointment and also followed up on the recommendations I made after the visit.  Time spent on virtual visit: 26 min   HPI: This is a very pleasant 63 year old man whom I am meeting for the first time via telemedicine visit  He was sent by his urologist Dr. Tresa Moore for incidental pancreatic cyst  He is morbidly obese with a BMI of 46.  He has obstructive sleep apnea and Parkinson's.  His Parkinson's affected him with clumsiness of his right hand and some slight tremors.  He is getting occupational, speech and physical therapy  He underwent a colonoscopy with 1 of my previous partners who has since retired, Dr. Delfin Edis February 2014 for some minor rectal bleeding.  The examination showed diverticulosis and some anal erosions.  She recommended that he have repeat colonoscopy for colon cancer screening at 10-year interval  MRI 05/2013: Several tiny fluid signal intensity lesions along the pancreatic body and tail likely represent post inflammatory findings. No dorsal pancreatic duct dilatation. These are not appreciably changed from 11/22/2012. Such lesions typically merit 1 year of observation. We have currently demonstrated stability for 6 months.  He has had an abnormal pancreas for at least 5 years now possibly longer.  There is an MRI from January 2015 which describes several tiny cysts in the pancreatic body and tail.  Since then he has had nearly yearly abdominal  imaging to follow what is presumed to be a renal cancer.  The pancreatic abnormality has slowly increased in size.  Most recent imaging, done through urology as well, CT scan IV and oral contrast April 2020 shows a "cystic lesion in the pancreatic body measuring 1.8 x 1.7 cm.  Compared to 1.6 x 1.3 in 2019... No evidence of pancreatic ductal dilation"  No history of pancreatic disease  No FH of pancreatic disease.  Stopped drinking 40 years ago.  Overall stable weight lately.  He is ambulatory.    Chief complaint is incidental pancreatic cyst  ROS: complete GI ROS as described in HPI, all other review negative.  Constitutional:  No unintentional weight loss   Past Medical History:  Diagnosis Date  . Adult BMI 50.0-59.9 kg/sq m    52.77  . Anal fissure   . Anxiety   . Diabetes mellitus without complication (HCC)    borderline  . GERD (gastroesophageal reflux disease)    30 years ago, maybe from peptic ulcer  . H/O: gout    STABLE  . Hepatic steatosis 10/01/11  . History of kidney stones   . Hyperlipidemia   . Hypertension   . Kidney tumor    right upper kidney tumor, monitoring  . OSA on CPAP    [facility]  . Parkinson disease ([place]) 01/04/2016  . REM sleep behavior disorder 12/06/2016  . Right ureteral stone   . Ureteral calculus, right 06/12/2011  . Vitamin D deficiency   .  Weakness     Past Surgical History:  Procedure Laterality Date  . ANAL FISSURE REPAIR  10-12-2000  . COLONOSCOPY N/A 07/01/2012   Procedure: COLONOSCOPY;  Surgeon: Lafayette Dragon, MD;  [facility];  Service: Endoscopy;  Laterality: N/A;  . CYSTOSCOPY WITH RETROGRADE PYELOGRAM, URETEROSCOPY AND STENT PLACEMENT Bilateral 02/26/2013   Procedure: CYSTOSCOPY WITH RETROGRADE PYELOGRAM, URETEROSCOPY AND STENT PLACEMENT;  Surgeon: Alexis Frock, MD;  Location: [facility];  Service: Urology;  Laterality: Bilateral;  . HOLMIUM LASER APPLICATION Bilateral 19/41/7408   Procedure: HOLMIUM LASER  APPLICATION;  Surgeon: Alexis Frock, MD;  Location: [facility];  Service: Urology;  Laterality: Bilateral;  . kidney stone removal     06/2011-stent placement  . LEFT MEDIAL FEMORAL CONDYLE DEBRIDEMENT & DRILLING/ REMOVAL LOOSE BODY  09-15-2003  . NASAL SEPTUM SURGERY  1985  . STONE EXTRACTION WITH BASKET  06/12/2011   Procedure: STONE EXTRACTION WITH BASKET;  Surgeon: Claybon Jabs, MD;  [facility];  Service: Urology;  Laterality: Right;    Current Outpatient Medications  Medication Sig Dispense Refill  . allopurinol (ZYLOPRIM) 300 MG tablet TAKE 1 TABLET BY MOUTH ONCE DAILY 90 tablet 3  . ALPRAZolam (XANAX) 0.5 MG tablet Take 1 tablet (0.5 mg total) by mouth 3 (three) times daily. 90 tablet 2  . aspirin EC 81 MG tablet Take 81 mg by mouth at bedtime.    Marland Kitchen buPROPion (WELLBUTRIN XL) 150 MG 24 hr tablet Take 1 tablet (150 mg total) by mouth every morning. 30 tablet 2  . carbidopa-levodopa (SINEMET IR) 25-100 MG tablet Take 2 tablets by mouth 3 (three) times daily. 540 tablet 2  . metFORMIN (GLUCOPHAGE-XR) 500 MG 24 hr tablet TAKE 2 TABLETS BY MOUTH 2 TIMES A DAY FOR DIABETES 360 tablet 3  . pramipexole (MIRAPEX) 0.75 MG tablet Take 1 tablet (0.75 mg total) by mouth 3 (three) times daily. 270 tablet 2  . rosuvastatin (CRESTOR) 40 MG tablet Take 1 tablet (40 mg total) by mouth daily. 90 tablet 1  . sitaGLIPtin (JANUVIA) 100 MG tablet TAKE 1 TABLET BY MOUTH ONCE DAILY FOR DIABETES 90 tablet 1  . valsartan-hydrochlorothiazide (DIOVAN-HCT) 320-25 MG tablet Take 1 tablet by mouth daily. 90 tablet 1  . VENTOLIN HFA 108 (90 Base) MCG/ACT inhaler INHALE 2 PUFFS INTO THE LUNGS EVERY 6 HOURS AS NEEDED FOR WHEEZING OR SHORTNESS OF BREATH. 48 g 3  . vitamin C (ASCORBIC ACID) 500 MG tablet Take 1 tablet (500 mg total) by mouth 3 (three) times daily. 21 tablet 0  . Vitamin D, Ergocalciferol, (DRISDOL) 50000 units CAPS capsule Take 50,000 Units by mouth every 7 (seven) days.     No  current facility-administered medications for this visit.     Allergies as of 08/28/2018 - Review Complete 08/27/2018  Allergen Reaction Noted  . Ciprofloxacin Hives 01/16/2017  . Ceftriaxone Hives 04/11/2016    Family History  Problem Relation Age of Onset  . Heart disease Mother        Atrial fibrillation  . Hypertension Mother   . Hyperlipidemia Mother   . Lung cancer Father        was a smoker  . Cancer Father        lung  . Hyperlipidemia Brother   . Heart disease Maternal Aunt   . Hyperlipidemia Maternal Aunt   . Hypertension Maternal Aunt   . Stroke Maternal Aunt   . Heart disease Paternal Grandmother   . Hyperlipidemia Paternal Grandmother  Social History   Socioeconomic History  . Marital status: Married    Spouse name: Not on file  . Number of children: 0  . Years of education: Not on file  . Highest education level: Not on file  Occupational History  . Not on file  Social Needs  . Financial resource strain: Not on file  . Food insecurity:    Worry: Not on file    Inability: Not on file  . Transportation needs:    Medical: Not on file    Non-medical: Not on file  Tobacco Use  . Smoking status: Never Smoker  . Smokeless tobacco: Never Used  Substance and Sexual Activity  . Alcohol use: No  . Drug use: No    Comment: QUIT SMOKING " POT" 40 YRS AGO  . Sexual activity: Not on file  Lifestyle  . Physical activity:    Days per week: Not on file    Minutes per session: Not on file  . Stress: Not on file  Relationships  . Social connections:    Talks on phone: Not on file    Gets together: Not on file    Attends religious service: Not on file    Active member of club or organization: Not on file    Attends meetings of clubs or organizations: Not on file    Relationship status: Not on file  . Intimate partner violence:    Fear of current or ex partner: Not on file    Emotionally abused: Not on file    Physically abused: Not on file    Forced  sexual activity: Not on file  Other Topics Concern  . Not on file  Social History Narrative   Right-handed.   No caffeine use.   Lives at home with his wife.     Physical Exam: Unable to perform because this was a "telemed visit" due to current Covid-19 pandemic  Assessment and plan: 63 y.o. male with incidental pancreatic cyst  He has had an abnormal pancreas for about 6 years now dating back to 2014.  He has not had an MRI for this since 2015.  We discussed the nature of incidental pancreatic cysts such as his and I explained to him that it is very unlikely that it will ever progress to anything serious.  I explained to him that MRI is the best way to look at a pancreatic cyst as it gives the most fine detail on enhancement, pancreatic duct abnormalities, dilation.  We will arrange for pancreatic MRI to be done in 4-5 weeks from now when it is safer from the pandemic perspective for him to go out.  If there are no concerning morphologic features on the MRI then I would probably recommend another 1 in 2 years and if at that point there are still no concerning morphologic features I think he can forego any future dedicated imaging of the pancreas.  Please see the "Patient Instructions" section for addition details about the plan.  Owens Loffler, MD [facility] 08/28/2018, 12:48 PM

## 2018-08-28 NOTE — Patient Instructions (Addendum)
MRI of the pancreas to best understand the pancreatic cystic process that has been present since at least 2014.  This is not urgent but I would not want it to be delayed more than 4 to 6 weeks from now.  He has some claustrophobia and he is a very large man and so he is requesting this to be done at a "open MRI"  You have been scheduled for an MRI at [facility] will contact you for a date and time for  Your appointment    time is  Please arrive 15 minutes prior to your appointment time for registration purposes. Please make certain not to have anything to eat or drink 6 hours prior to your test. In addition, if you have any metal in your body, have a pacemaker or defibrillator, please be sure to let your ordering physician know. This test typically takes 45 minutes to 1 hour to complete. Should you need to reschedule, please call [phone] to do so.

## 2018-09-02 ENCOUNTER — Other Ambulatory Visit: Payer: Self-pay | Admitting: Physician Assistant

## 2018-09-02 DIAGNOSIS — I10 Essential (primary) hypertension: Secondary | ICD-10-CM

## 2018-09-02 MED FILL — VALSARTAN-HCTZ 320-25 MG TA: 320-25 | 30 days supply | Qty: 30 | Fill #0

## 2018-09-02 MED FILL — JANUVIA 100 MG TABLET: 100 | 30 days supply | Qty: 30 | Fill #3

## 2018-09-02 MED FILL — metFORMIN HCL ER 500 MG TB2: 500 | 30 days supply | Qty: 120 | Fill #2

## 2018-09-02 MED FILL — ALLOPURINOL 300 MG TAB: 300 | 30 days supply | Qty: 30 | Fill #3

## 2018-09-03 DIAGNOSIS — G4733 Obstructive sleep apnea (adult) (pediatric): Secondary | ICD-10-CM | POA: Diagnosis not present

## 2018-09-06 ENCOUNTER — Ambulatory Visit
Admission: RE | Admit: 2018-09-06 | Discharge: 2018-09-06 | Disposition: A | Discharge status: Home / Self Care | Payer: 59 | Source: Ambulatory Visit | Attending: Gastroenterology | Admitting: Gastroenterology

## 2018-09-06 ENCOUNTER — Other Ambulatory Visit: Payer: Self-pay

## 2018-09-06 DIAGNOSIS — K862 Cyst of pancreas: Secondary | ICD-10-CM

## 2018-09-06 MED ORDER — GADOBENATE DIMEGLUMINE 529 MG/ML IV SOLN
20.0000 mL | Freq: Once | INTRAVENOUS | Status: AC | PRN
  Administered 2018-09-06: 20 mL via INTRAVENOUS

## 2018-09-16 MED FILL — CARBIDOPA/LEVO 25/100 TAB: 25-100 | 30 days supply | Qty: 180 | Fill #2

## 2018-09-25 NOTE — Progress Notes (Signed)
Todd Rice:  Hypertension:  -Continue medication,  -monitor blood pressure at home.  -Continue DASH diet.   -Reminder to go to Todd ER if any CP, SOB, nausea, dizziness, severe HA, changes vision/speech, left arm numbness and tingling, and jaw pain.  Cholesterol: -Continue diet and exercise.  -Check cholesterol.   Diabetes: -recommend that if further worsening may need medication -try for weight loss for help with parkinsons -Continue diet and exercise.  -Check A1C Has eye OV   Parkinsons -cont medications -followed by neuro Ask them about sleep medications  Depression - continue wellbutrin stress management techniques discussed, increase water, good sleep hygiene discussed, increase exercise, and increase veggies.   Obesity - follow up 3 months for progress monitoring - increase veggies, decrease carbs - long discussion about weight loss, diet, and exercise   Continue diet and meds as discussed. Further disposition pending results of labs. Future Appointments  Date Time Provider [place]  09/26/2018  9:30 AM Vicie Mutters, PA-C [facility]  10/16/2018  8:15 AM Suzzanne Cloud, NP [facility]  12/19/2018  8:00 AM Sharen Counter, CCC-SLP [facility]  12/19/2018  8:15 AM Frazier Butt, PT [facility]  12/19/2018  8:30 AM Rine, Selmer Dominion, OT [facility]  01/28/2019  9:00 AM Vicie Mutters, PA-C [facility]  07/30/2019  1:30 PM Dohmeier, Asencion Partridge, MD [facility]    Todd Rice 63 y.o. male  presents for 3 month follow up with hypertension, hyperlipidemia, prediabetes and vitamin D.  He is being followed by Dr. Ardis Hughs for pancreatic cyst.  He is following with nephrology for possible RCC, it shrunk on its own so they will monitor it next year.   Patient reports that he has been following with neurology and is starting to do PT again for his parkinsons.  He reports that his right hand has still been very difficult.  He reports that he has been  taking Todd mirapex and sinemet. He states he can not sleep well, will have 4-5 hours at night, has talked with Dr. [name] He is on a BiPAP machine, uses regularly.    BMI is Body mass index is 44.76 kg/m., he is working on diet and exercise. Wt Readings from Last 3 Encounters:  09/26/18 285 lb 12.8 oz (129.6 kg)  08/28/18 293 lb (132.9 kg)  08/27/18 293 lb (132.9 kg)     His blood pressure has been controlled at home, today their BP is  .   He does not workout. He denies chest pain, shortness of breath, dizziness.   He is on cholesterol medication and denies myalgias. His cholesterol is at goal. Todd cholesterol last visit was:   Lab Results  Component Value Date   CHOL 188 04/25/2018   HDL 34 (L) 04/25/2018   [place]  04/25/2018     Comment:     . LDL cholesterol not calculated. Triglyceride levels greater than 400 mg/dL invalidate calculated LDL results. . Reference range: <100 . Desirable range <100 mg/dL for primary prevention;   <70 mg/dL for patients with CHD Todd diabetic patients  with > Todd = 2 CHD risk factors. Marland Kitchen LDL-C is now calculated using Todd Martin-Hopkins  calculation, which is a validated novel method providing  better accuracy than Todd Friedewald equation in Todd  estimation of LDL-C.  Cresenciano Genre et al. Annamaria Helling. 4818;563(14): 2061-2068  (http://education.[employer].com/faq/FAQ164)    TRIG 410 (H) 04/25/2018   CHOLHDL 5.5 (H) 04/25/2018   He has been working on diet and exercise  for Diabetes with diabetic chronic kidney disease, he is on bASA, he is not on ACE/ARB, in Todd AM 150's and Todd in eveing 170's and, he will eat twice a day, denies paresthesia of Todd feet, polydipsia, polyuria and visual disturbances. Last A1C was:  Lab Results  Component Value Date   HGBA1C 7.4 (H) 04/25/2018   Lab Results  Component Value Date   GFRNONAA 64 06/10/2018   Patient is on Vitamin D supplement.  Lab Results  Component Value Date   VD25OH 61 06/10/2018       Patient is on allopurinol for gout and does report a recent flare.  Lab Results  Component Value Date   LABURIC 6.5 07/17/2017    Current Medications:  Current Outpatient Medications on File Prior to Visit  Medication Sig Dispense Refill  . allopurinol (ZYLOPRIM) 300 MG tablet TAKE 1 TABLET BY MOUTH ONCE DAILY 90 tablet 3  . ALPRAZolam (XANAX) 0.5 MG tablet Take 1 tablet (0.5 mg total) by mouth 3 (three) times daily. 90 tablet 2  . aspirin EC 81 MG tablet Take 81 mg by mouth at bedtime.    Marland Kitchen buPROPion (WELLBUTRIN XL) 150 MG 24 hr tablet Take 1 tablet (150 mg total) by mouth every morning. 30 tablet 2  . carbidopa-levodopa (SINEMET IR) 25-100 MG tablet Take 2 tablets by mouth 3 (three) times daily. 540 tablet 2  . metFORMIN (GLUCOPHAGE-XR) 500 MG 24 hr tablet TAKE 2 TABLETS BY MOUTH 2 TIMES A DAY FOR DIABETES 360 tablet 3  . pramipexole (MIRAPEX) 0.75 MG tablet Take 1 tablet (0.75 mg total) by mouth 3 (three) times daily. 270 tablet 2  . rosuvastatin (CRESTOR) 40 MG tablet Take 1 tablet (40 mg total) by mouth daily. 90 tablet 1  . sitaGLIPtin (JANUVIA) 100 MG tablet TAKE 1 TABLET BY MOUTH ONCE DAILY FOR DIABETES 90 tablet 1  . valsartan-hydrochlorothiazide (DIOVAN-HCT) 320-25 MG tablet TAKE 1 TABLET BY MOUTH DAILY. 90 tablet 1  . VENTOLIN HFA 108 (90 Base) MCG/ACT inhaler INHALE 2 PUFFS INTO Todd LUNGS EVERY 6 HOURS AS NEEDED FOR WHEEZING Todd SHORTNESS OF BREATH. 48 g 3  . vitamin C (ASCORBIC ACID) 500 MG tablet Take 1 tablet (500 mg total) by mouth 3 (three) times daily. 21 tablet 0  . Vitamin D, Ergocalciferol, (DRISDOL) 50000 units CAPS capsule Take 50,000 Units by mouth every 7 (seven) days.     No current facility-administered medications on file prior to visit.     Medical History:  Past Medical History:  Diagnosis Date  . Adult BMI 50.0-59.9 kg/sq m    52.77  . Anal fissure   . Anxiety   . Diabetes mellitus without complication (HCC)    borderline  . GERD (gastroesophageal  reflux disease)    30 years ago, maybe from peptic ulcer  . H/O: gout    STABLE  . Hepatic steatosis 10/01/11  . History of kidney stones   . Hyperlipidemia   . Hypertension   . Kidney tumor    right upper kidney tumor, monitoring  . OSA on CPAP    [facility]  . Parkinson disease ([place]) 01/04/2016  . REM sleep behavior disorder 12/06/2016  . Right ureteral stone   . Ureteral calculus, right 06/12/2011  . Vitamin D deficiency   . Weakness     Allergies:  Allergies  Allergen Reactions  . Ciprofloxacin Hives  . Ceftriaxone Hives     Review of Systems:  Review of Systems  Constitutional: Negative for  chills, fever and malaise/fatigue.  HENT: Negative for congestion, ear pain and sore throat.   Eyes: Negative.   Respiratory: Negative for cough, shortness of breath and wheezing.   Cardiovascular: Negative for chest pain, palpitations and leg swelling.  Gastrointestinal: Negative for abdominal pain, blood in stool, constipation, diarrhea, heartburn and melena.  Genitourinary: Negative.   Skin: Negative.   Neurological: Negative for dizziness, sensory change, loss of consciousness and headaches.  Psychiatric/Behavioral: Positive for depression. Negative for hallucinations, memory loss, substance abuse and suicidal ideas. Todd patient is not nervous/anxious and does not have insomnia.     Family history- Review and unchanged  Social history- Review and unchanged  Physical Exam: There were no vitals taken for this visit. Wt Readings from Last 3 Encounters:  08/28/18 293 lb (132.9 kg)  08/27/18 293 lb (132.9 kg)  07/24/18 293 lb (132.9 kg)    General Appearance: Well nourished, in no apparent distress.  Eyes: PERRLA, EOMs, conjunctiva no swelling Todd erythema ENT/Mouth: Ear canals clear bilaterally with no erythema, swelling, discharge.  TMs normal bilaterally with no erythema, bulging, Todd retractions.  Oropharynx clear and moist with no exudate, swelling, Todd erythema.  Dentition  normal.   Neck: Supple, thyroid normal. No bruits, JVD, cervical adenopathy Respiratory: Respiratory effort normal, BS equal bilaterally without rales, rhonchi, wheezing Todd stridor.  Cardio: RRR without murmurs, rubs Todd gallops. Brisk peripheral pulses without edema.  Chest: symmetric, with normal excursions Abdomen: Soft, obese, nontender, no guarding, rebound, hernias, masses, Todd organomegaly. Musculoskeletal: Full ROM all peripheral extremities,5/5 strength, and normal gait.  Skin: Warm, dry without rashes, lesions, ecchymosis. Neuro: A&Ox3, Cranial nerves intact, slow gait.  Overshoot of Todd right hand with finger to nose.  Tremor of Todd left hand with finger opposition>tremor of Todd right hand with finger opposition.    Psych: Flat affect, Insight and Judgment appropriate.     Vicie Mutters, PA-C 12:54 PM [facility]

## 2018-09-26 ENCOUNTER — Ambulatory Visit: Payer: 59 | Admitting: Physician Assistant

## 2018-09-26 ENCOUNTER — Encounter: Payer: Self-pay | Admitting: Physician Assistant

## 2018-09-26 ENCOUNTER — Other Ambulatory Visit: Payer: Self-pay

## 2018-09-26 VITALS — BP 128/76 | HR 68 | Temp 97.6°F | Ht 67.0 in | Wt 285.8 lb

## 2018-09-26 DIAGNOSIS — E1322 Other specified diabetes mellitus with diabetic chronic kidney disease: Secondary | ICD-10-CM

## 2018-09-26 DIAGNOSIS — E11620 Type 2 diabetes mellitus with diabetic dermatitis: Secondary | ICD-10-CM

## 2018-09-26 DIAGNOSIS — I10 Essential (primary) hypertension: Secondary | ICD-10-CM | POA: Diagnosis not present

## 2018-09-26 DIAGNOSIS — E1365 Other specified diabetes mellitus with hyperglycemia: Secondary | ICD-10-CM

## 2018-09-26 DIAGNOSIS — Z130 Encounter for screening for diseases of the blood and blood-forming organs and certain disorders involving the immune mechanism: Secondary | ICD-10-CM

## 2018-09-26 DIAGNOSIS — N183 Chronic kidney disease, stage 3 (moderate): Secondary | ICD-10-CM

## 2018-09-26 DIAGNOSIS — G20A1 Parkinson's disease without dyskinesia, without mention of fluctuations: Secondary | ICD-10-CM

## 2018-09-26 DIAGNOSIS — I700 Atherosclerosis of aorta: Secondary | ICD-10-CM | POA: Diagnosis not present

## 2018-09-26 DIAGNOSIS — G20 Parkinson's disease: Secondary | ICD-10-CM

## 2018-09-26 DIAGNOSIS — Z6841 Body Mass Index (BMI) 40.0 and over, adult: Secondary | ICD-10-CM

## 2018-09-26 DIAGNOSIS — IMO0002 Reserved for concepts with insufficient information to code with codable children: Secondary | ICD-10-CM

## 2018-09-26 DIAGNOSIS — E785 Hyperlipidemia, unspecified: Secondary | ICD-10-CM

## 2018-09-26 DIAGNOSIS — Z79899 Other long term (current) drug therapy: Secondary | ICD-10-CM

## 2018-09-26 MED ORDER — ALPRAZOLAM 0.5 MG PO TABS: 0.5000 mg | ORAL_TABLET | Freq: Three times a day (TID) | ORAL | 2 refills | Status: DC

## 2018-09-26 MED FILL — ALPRAZolam 0.5 MG TABS: 0.5 | 30 days supply | Qty: 90 | Fill #0

## 2018-09-26 NOTE — Patient Instructions (Signed)
11 Tips to Follow:  1. No caffeine after 3pm: Avoid beverages with caffeine (soda, tea, energy drinks, etc.) especially after 3pm. 2. Don't go to bed hungry: Have your evening meal at least 3 hrs. before going to sleep. It's fine to have a small bedtime snack such as a glass of milk and a few crackers but don't have a big meal. 3. Have a nightly routine before bed: Plan on "winding down" before you go to sleep. Begin relaxing about 1 hour before you go to bed. Try doing a quiet activity such as listening to calming music, reading a book or meditating. 4. Turn off the TV and ALL electronics including video games, tablets, laptops, etc. 1 hour before sleep, and keep them out of the bedroom. 5. Turn off your cell phone and all notifications (new email and text alerts) or even better, leave your phone outside your room while you sleep. Studies have shown that a part of your brain continues to respond to certain lights and sounds even while you're still asleep. 6. Make your bedroom quiet, dark and cool. If you can't control the noise, try wearing earplugs or using a fan to block out other sounds. 7. Practice relaxation techniques. Try reading a book or meditating or drain your brain by writing a list of what you need to do the next day. 8. Don't nap unless you feel sick: you'll have a better night's sleep. 9. Don't smoke, or quit if you do. Nicotine, alcohol, and marijuana can all keep you awake. Talk to your health care provider if you need help with substance use. 10. Most importantly, wake up at the same time every day (or within 1 hour of your usual wake up time) EVEN on the weekends. A regular wake up time promotes sleep hygiene and prevents sleep problems. 11. Reduce exposure to bright light in the last three hours of the day before going to sleep. Maintaining good sleep hygiene and having good sleep habits lower your risk of developing sleep problems. Getting better sleep can also improve your  concentration and alertness. Try the simple steps in this guide. If you still have trouble getting enough rest, make an appointment with your health care provider.     When it comes to diets, agreement about the perfect plan isn't easy to find, even among the experts. Experts at the [employer] developed an idea known as the Healthy Eating Plate. Just imagine a plate divided into logical, healthy portions.  The emphasis is on diet quality:  Load up on vegetables and fruits - one-half of your plate: Aim for color and variety, and remember that potatoes don't count.  Go for whole grains - one-quarter of your plate: Whole wheat, barley, wheat berries, quinoa, oats, brown rice, and foods made with them. If you want pasta, go with whole wheat pasta.  Protein power - one-quarter of your plate: Fish, chicken, beans, and nuts are all healthy, versatile protein sources. Limit red meat.  The diet, however, does go beyond the plate, offering a few other suggestions.  Use healthy plant oils, such as olive, canola, soy, corn, sunflower and peanut. Check the labels, and avoid partially hydrogenated oil, which have unhealthy trans fats.  If you're thirsty, drink water. Coffee and tea are good in moderation, but skip sugary drinks and limit milk and dairy products to one or two daily servings.  The type of carbohydrate in the diet is more important than the amount. Some sources of  carbohydrates, such as vegetables, fruits, whole grains, and beans-are healthier than others.  Finally, stay active.

## 2018-09-27 LAB — HEMOGLOBIN A1C
Hgb A1c MFr Bld: 7.5 % of total Hgb — ABNORMAL HIGH (ref <5.7)
Mean Plasma Glucose: 169 (calc)
eAG (mmol/L): 9.3 (calc)

## 2018-09-27 LAB — CBC WITH DIFFERENTIAL/PLATELET
Absolute Monocytes: 573 cells/uL (ref 200–950)
Basophils Absolute: 17 cells/uL (ref 0–200)
Basophils Relative: 0.2 %
Eosinophils Absolute: 66 cells/uL (ref 15–500)
Eosinophils Relative: 0.8 %
HCT: 47.2 % (ref 38.5–50.0)
Hemoglobin: 15.6 g/dL (ref 13.2–17.1)
Lymphs Abs: 1494 cells/uL (ref 850–3900)
MCH: 26.9 pg — ABNORMAL LOW (ref 27.0–33.0)
MCHC: 33.1 g/dL (ref 32.0–36.0)
MCV: 81.2 fL (ref 80.0–100.0)
MPV: 10.7 fL (ref 7.5–12.5)
Monocytes Relative: 6.9 %
Neutro Abs: 6150 cells/uL (ref 1500–7800)
Neutrophils Relative %: 74.1 %
Platelets: 190 10*3/uL (ref 140–400)
RBC: 5.81 10*6/uL — ABNORMAL HIGH (ref 4.20–5.80)
RDW: 14.1 % (ref 11.0–15.0)
Total Lymphocyte: 18 %
WBC: 8.3 10*3/uL (ref 3.8–10.8)

## 2018-09-27 LAB — COMPLETE METABOLIC PANEL WITH GFR
AG Ratio: 1.3 (calc) (ref 1.0–2.5)
ALT: 19 U/L (ref 9–46)
AST: 19 U/L (ref 10–35)
Albumin: 4.2 g/dL (ref 3.6–5.1)
Alkaline phosphatase (APISO): 53 U/L (ref 35–144)
BUN: 21 mg/dL (ref 7–25)
CO2: 24 mmol/L (ref 20–32)
Calcium: 10.6 mg/dL — ABNORMAL HIGH (ref 8.6–10.3)
Chloride: 96 mmol/L — ABNORMAL LOW (ref 98–110)
Creat: 1.12 mg/dL (ref 0.70–1.25)
GFR, Est African American: 80 mL/min/{1.73_m2} (ref >=60)
GFR, Est Non African American: 69 mL/min/{1.73_m2} (ref >=60)
Globulin: 3.3 g/dL (calc) (ref 1.9–3.7)
Glucose, Bld: 256 mg/dL — ABNORMAL HIGH (ref 65–99)
Potassium: 4.1 mmol/L (ref 3.5–5.3)
Sodium: 132 mmol/L — ABNORMAL LOW (ref 135–146)
Total Bilirubin: 0.4 mg/dL (ref 0.2–1.2)
Total Protein: 7.5 g/dL (ref 6.1–8.1)

## 2018-09-27 LAB — MAGNESIUM: Magnesium: 1.5 mg/dL (ref 1.5–2.5)

## 2018-09-27 LAB — LIPID PANEL
Cholesterol: 186 mg/dL (ref <200)
HDL: 28 mg/dL — ABNORMAL LOW (ref >=40)
Non-HDL Cholesterol (Calc): 158 mg/dL (calc) — ABNORMAL HIGH (ref <130)
Total CHOL/HDL Ratio: 6.6 (calc) — ABNORMAL HIGH (ref <5.0)
Triglycerides: 434 mg/dL — ABNORMAL HIGH (ref <150)

## 2018-09-27 LAB — IRON,?TOTAL/TOTAL IRON BINDING CAP: %SAT: 22 % (calc) (ref 20–48)

## 2018-09-27 LAB — TSH: TSH: 3.94 mIU/L (ref 0.40–4.50)

## 2018-09-27 LAB — IRON, TOTAL/TOTAL IRON BINDING CAP
Iron: 78 ug/dL (ref 50–180)
TIBC: 350 mcg/dL (calc) (ref 250–425)

## 2018-09-27 LAB — FERRITIN: Ferritin: 87 ng/mL (ref 24–380)

## 2018-09-27 LAB — VITAMIN B12: Vitamin B-12: 667 pg/mL (ref 200–1100)

## 2018-10-01 DIAGNOSIS — H5203 Hypermetropia, bilateral: Secondary | ICD-10-CM | POA: Diagnosis not present

## 2018-10-01 DIAGNOSIS — H524 Presbyopia: Secondary | ICD-10-CM | POA: Diagnosis not present

## 2018-10-01 LAB — HM DIABETES EYE EXAM

## 2018-10-02 MED FILL — ALLOPURINOL 300 MG TAB: 300 | 30 days supply | Qty: 30 | Fill #4

## 2018-10-02 MED FILL — JANUVIA 100 MG TABLET: 100 | 30 days supply | Qty: 30 | Fill #4

## 2018-10-02 MED FILL — metFORMIN HCL ER 500 MG TB2: 500 | 30 days supply | Qty: 120 | Fill #3

## 2018-10-02 MED FILL — VALSARTAN-HCTZ 320-25 MG TA: 320-25 | 30 days supply | Qty: 30 | Fill #1

## 2018-10-06 ENCOUNTER — Other Ambulatory Visit: Payer: 59 | Admitting: Internal Medicine

## 2018-10-06 DIAGNOSIS — M47816 Spondylosis without myelopathy or radiculopathy, lumbar region: Secondary | ICD-10-CM | POA: Diagnosis not present

## 2018-10-06 DIAGNOSIS — S39012A Strain of muscle, fascia and tendon of lower back, initial encounter: Secondary | ICD-10-CM

## 2018-10-06 MED ORDER — CYCLOBENZAPRINE HCL 10 MG PO TABS: ORAL_TABLET | ORAL | 0 refills | Status: DC

## 2018-10-06 MED ORDER — TRAMADOL HCL 50 MG PO TABS: ORAL_TABLET | ORAL | 0 refills | Status: DC

## 2018-10-06 MED ORDER — PREDNISONE 20 MG PO TABS: ORAL_TABLET | ORAL | 0 refills | Status: DC

## 2018-10-06 NOTE — Progress Notes (Signed)
THIS ENCOUNTER IS A VIRTUAL VISIT DUE TO COVID-19 - PATIENT WAS NOT SEEN IN THE OFFICE.  PATIENT HAS CONSENTED TO VIRTUAL VISIT / TELEMEDICINE VISIT  Virtual Visit via telephone Note  I connected with  Shanon Payor   on 10/06/2018   by telephone.  I verified that I am speaking with the correct person using two identifiers.    I discussed the limitations of evaluation and management by telemedicine and the availability of in person appointments. The patient expressed understanding and agreed to proceed.  History of Present Illness:  This very nice 63 y.o. MWM with HTN, HLD, T2-DM, OSA/BiPAP, Parkinson's Dz, R Renal Cancer and Vit D Deficiency called emergently this Sun am with c/o:    2 day hx/o of unprovoked midline LBP w/o sciatica. - gradually increasing in pain severity - unable to easily stand w/o severe pain. Tried old Rx Robaxin w/o benefit. Wife applying Biofreeze. Denies foot drop, urinary retention or sciatica. No GI sx's.   Medications  .  metFORMIN-XR 500 MG , TAKE 2 TABLETS 2 TIMES A DAY  .  JANUVIA 100 MG tablet, TAKE 1 TABLET DAILY  .  CRESTOR 40 MG tablet, Take 1 tablet  daily. Marland Kitchen  DIOVAN-HCT 320-25 MG tablet, TAKE 1 TABLET  DAILY.  .  VENTOLIN HFA inhaler,  2 PUFFS  EVERY 6 HRS AS NEEDED  .  allopurinol  300 MG tablet, TAKE 1 TABLET  ONCE DAILY .  aspirin EC 81 MG tablet, Take  at bedtime. Marland Kitchen  XANAX 0.5 MG tablet, Take 1 tablet  3  times daily. .  WELLBUTRIN XL 150 MG , Take 1 tablet  every morning. Marland Kitchen  SINEMET IR 25-100 MG , Take 2 tablets  3  times daily. Marland Kitchen  MIRAPEX 0.75 MG tablet, Take 1 tablet  3  times daily. .  vitamin C ( 500 MG tablet, Take 1 tablet  3  times daily. .  Vitamin D 50,000 units CAPS , Take every 7  days.  Problem list He has CAD (coronary artery disease); Hypertension; Hyperlipidemia; Morbid obesity with BMI of 40.0-44.9, adult ([place]); Pseudomonas aeruginosa colonization; Seasonal and perennial allergic rhinitis; Asthma, mild intermittent,  well-controlled; Parkinson disease ([place]); Uncontrolled secondary diabetes mellitus with stage 3 CKD (GFR 30-59) ([place]); Medication management; Vitamin D deficiency; REM sleep behavior disorder; OSA treated with BiPAP; Controlled type 2 diabetes mellitus with diabetic dermatitis, without long-term current use of insulin ([place]); and Aortic atherosclerosis ([place]) on their problem list.   Observations/Objective:  General : Well sounding patient in no apparent distress HEENT: no hoarseness, no cough for duration of visit Lungs: speaks in complete sentences, no audible wheezing, no apparent distress Neurological: alert, oriented x 3 Psychiatric: pleasant, judgement appropriate   Assessment and Plan:  1. Lumbar strain, initial encounter  - predniSONE 20 MG ; 1 tab 3 x day for 2 days, then 2 x day for 2 days, then 1 x day for 3 days  Dis: 13 tablet - cyclobenzaprine  10 MG ; Take 1/2 to 1 tab 3 x /day as needed for Muscle Spasm  Disp: 30 tab;  - traMADol ) 50 MG ; Take 1/2 to 1 tab every 4 hours as needed for Severe pain  Disp: 30 tablet  2. Spondylosis of lumbar region without myelopathy or radiculopathy  Follow Up Instructions:     I discussed the assessment and treatment plan with the patient. The patient was provided an opportunity to ask questions and all were answered.  The patient agreed with the plan and demonstrated an understanding of the instructions.      The patient was advised to call back or seek an in-person evaluation if the symptoms worsen or if the condition fails to improve as anticipated. Patient was advised risk of hyperglycemia with prednisone & does monitor CBG's 2 x / day usu running 150's in am & 90-110 mg% in afternoons. Advised to call if CBG's > 250 mg%. Also discussed to call if develop foot drop or urinary retention.     I provided 11 minutes of non-face-to-face time during this encounter & 18 minutes of counseling, chart review, and critical decision making was  performed  Kirtland Bouchard, MD

## 2018-10-07 MED ORDER — GLIPIZIDE 5 MG PO TABS: 5.0000 mg | ORAL_TABLET | Freq: Two times a day (BID) | ORAL | 2 refills | Status: DC

## 2018-10-07 MED FILL — glipiZIDE 5 MG TABS: 5 | 30 days supply | Qty: 60 | Fill #0

## 2018-10-14 ENCOUNTER — Telehealth: Payer: Self-pay | Admitting: *Deleted

## 2018-10-14 NOTE — Telephone Encounter (Signed)
Due to current COVID 19 pandemic, our office is severely reducing in office visits until further notice, in order to minimize the risk to our patients and healthcare providers.  Pt understands that although there may be some limitations with this type of visit, we will take all precautions to reduce any security or privacy concerns.  Pt understands that this will be treated like an in office visit and we will file with pt's insurance, and there may be a patient responsible charge related to this service. Consented to [email or web address] visit.  Email sent to [email or web address].

## 2018-10-15 NOTE — Progress Notes (Signed)
Virtual Visit via Video Note  I connected with Todd Rice on 10/16/18 at  8:15 AM EDT by a video enabled telemedicine application and verified that I am speaking with the correct person using two identifiers.  Location: Patient: At his home  Provider: In the office    I discussed the limitations of evaluation and management by telemedicine and the availability of in person appointments. The patient expressed understanding and agreed to proceed.  History of Present Illness: 10/16/2018 SS: Todd Rice is a 63 year old male with history of Parkinson's disease.  He is currently taking Sinemet 25/100 mg tablets, 2 tablets 3 times daily, 0.75 mg of Mirapex 3 times daily.  He reports continued trouble with his right hand, has difficulty picking things up.  He indicates he has to use his first and second fingers to grasp things.  He denies any trouble with his walking, he has not had any falls.  His swallowing and eating is doing well, there have been a few times he has felt like he was in a choke, but was able to drink some water to prevent choking.  He completed speech, physical, occupational therapy in January 2020, he is due back in August for reevaluation.  He reports he has been having some occasional trouble sleeping over the last 1 to 75-months, he will sleep for a few hours, and then will wake in the early morning, unable to go back to sleep.  He has had an occasional vivid dream. He does use CPAP, reports excellent compliance.  He denies any freezing episodes, but notices at the end of the day he will move slower.  He will walk twice a day, does word searches to keep his brain active.  He is taking his Sinemet at 8 am, 1 PM, 6 PM.  05/17/2018 Dr. [name]: Todd Rice is a 63 year old right-handed white male with a history of Parkinson's disease.  The patient currently is an occupational, speech, and physical therapy.  The patient is on Sinemet taking the 25/100 mg tablets, 1.5 tablets 3 times  daily.  He is on 0.75 mg of Mirapex 3 times daily.  The patient is having ongoing problems with clumsiness of the right hand, he has difficulty picking up small objects with the hand.  He may have some slight tremors at times.  He is on BiPAP at night, he will have vivid dreams at times.  The patient is trying to stay active, he reports no falls.  He denies issues with swallowing.  He returns to this office for an evaluation.   Observations/Objective: Alert, answers questions appropriately, follows commands, speech is clear and concise, Masking of the face is noted, facial symmetry noted, gait is intact, limited arm swing, moderate pace, good turns  Assessment and Plan: 1.  Parkinson's disease  Overall he indicates his condition is stable.  He continues to complain of problems with his right hand, specifically grasping objects.  He describes clumsiness with his right hand.  He will continue taking Sinemet 25/100 mg tablets, 2 tablets 3 times a day.  We will increase his Mirapex slowly to 1 mg 3 times a day, over about 4 weeks.  I have sent in a new prescription for Mirapex.  He will have rehabilitation services reevaluation in August 2020. If his trouble sleeping continues, he will call and let me know.  He will follow-up in 5 months with Dr. [name].   Follow Up Instructions: 5 months, 03/18/2019 8:00 with Dr. [name]  I discussed the assessment and treatment plan with the patient. The patient was provided an opportunity to ask questions and all were answered. The patient agreed with the plan and demonstrated an understanding of the instructions.   The patient was advised to call back or seek an in-person evaluation if the symptoms worsen or if the condition fails to improve as anticipated.  I provided 15 minutes of non-face-to-face time during this encounter.  Evangeline Dakin, DNP  [facility] [address], [place], [place] [address] [phone]

## 2018-10-16 ENCOUNTER — Ambulatory Visit (INDEPENDENT_AMBULATORY_CARE_PROVIDER_SITE_OTHER): Payer: 59 | Admitting: Neurology

## 2018-10-16 ENCOUNTER — Other Ambulatory Visit: Payer: Self-pay

## 2018-10-16 ENCOUNTER — Encounter: Payer: Self-pay | Admitting: Neurology

## 2018-10-16 DIAGNOSIS — G20 Parkinson's disease: Secondary | ICD-10-CM | POA: Diagnosis not present

## 2018-10-16 MED ORDER — PRAMIPEXOLE DIHYDROCHLORIDE 1 MG PO TABS: 1.0000 mg | ORAL_TABLET | Freq: Three times a day (TID) | ORAL | 1 refills | Status: DC

## 2018-10-16 MED ORDER — CARBIDOPA-LEVODOPA 25-100 MG PO TABS: 2.0000 | ORAL_TABLET | Freq: Three times a day (TID) | ORAL | 2 refills | Status: DC

## 2018-10-16 MED FILL — PRAMIPEXOLE 1 MG TABLET: 1 | 30 days supply | Qty: 90 | Fill #0

## 2018-10-16 MED FILL — CARBIDOPA/LEVO 25/100 TAB: 25-100 | 30 days supply | Qty: 180 | Fill #0

## 2018-10-16 NOTE — Progress Notes (Signed)
I have read the note, and I agree with the clinical assessment and plan.  Kerianne Gurr K Marjean Imperato   

## 2018-10-17 ENCOUNTER — Other Ambulatory Visit: Payer: Self-pay | Admitting: Physician Assistant

## 2018-10-17 DIAGNOSIS — S39012A Strain of muscle, fascia and tendon of lower back, initial encounter: Secondary | ICD-10-CM

## 2018-10-17 MED ORDER — PREDNISONE 20 MG PO TABS: ORAL_TABLET | ORAL | 0 refills | Status: DC

## 2018-10-17 MED FILL — predniSONE 20 MG TABS: 20 | 7 days supply | Qty: 13 | Fill #0

## 2018-10-31 ENCOUNTER — Other Ambulatory Visit: Payer: Self-pay | Admitting: Internal Medicine

## 2018-10-31 MED FILL — ALLOPURINOL 300 MG TAB: 300 | 30 days supply | Qty: 30 | Fill #5

## 2018-10-31 MED FILL — METFORMIN HCL ER 500 MG TB2: 500 | 30 days supply | Qty: 120 | Fill #4

## 2018-10-31 MED FILL — VALSARTAN-HCTZ 320-25 MG TA: 320-25 | 30 days supply | Qty: 30 | Fill #2

## 2018-10-31 MED FILL — JANUVIA 100 MG TABLET: 100 | 30 days supply | Qty: 30 | Fill #0

## 2018-11-13 MED FILL — CARBIDOPA-LEVODOPA 25-100 T: 25-100 | 30 days supply | Qty: 180 | Fill #1

## 2018-11-29 MED FILL — JANUVIA 100 MG TABLET: 100 | 30 days supply | Qty: 30 | Fill #1

## 2018-11-29 MED FILL — ALLOPURINOL 300 MG TAB: 300 | 30 days supply | Qty: 30 | Fill #6

## 2018-11-29 MED FILL — METFORMIN HCL ER 500 MG TB2: 500 | 30 days supply | Qty: 120 | Fill #5

## 2018-11-29 MED FILL — VALSARTAN-HCTZ 320-25 MG TA: 320-25 | 30 days supply | Qty: 30 | Fill #3

## 2018-12-17 MED FILL — CARBIDOPA-LEVODOPA 25-100 T: 25-100 | 30 days supply | Qty: 180 | Fill #2

## 2018-12-19 ENCOUNTER — Ambulatory Visit: Payer: 59 | Admitting: Physical Therapy

## 2018-12-19 ENCOUNTER — Ambulatory Visit: Payer: 59

## 2018-12-19 ENCOUNTER — Ambulatory Visit: Payer: 59 | Admitting: Occupational Therapy

## 2018-12-31 MED FILL — metFORMIN HCL ER 500 MG TB2: 500 | 30 days supply | Qty: 120 | Fill #6

## 2018-12-31 MED FILL — JANUVIA 100 MG TABLET: 100 | 30 days supply | Qty: 30 | Fill #2

## 2018-12-31 MED FILL — VALSARTAN-HCTZ 320-25 MG TA: 320-25 | 30 days supply | Qty: 30 | Fill #4

## 2018-12-31 MED FILL — ALPRAZolam 0.5 MG TABS: 0.5 | 30 days supply | Qty: 90 | Fill #1

## 2018-12-31 MED FILL — ALLOPURINOL 300 MG TAB: 300 | 30 days supply | Qty: 30 | Fill #7

## 2019-01-15 MED FILL — CARBIDOPA-LEVODOPA 25-100 T: 25-100 | 30 days supply | Qty: 180 | Fill #3

## 2019-01-28 ENCOUNTER — Encounter: Payer: Self-pay | Admitting: Physician Assistant

## 2019-01-29 MED FILL — VALSARTAN-HCTZ 320-25 MG TA: 320-25 | 30 days supply | Qty: 30 | Fill #5

## 2019-01-29 MED FILL — METFORMIN HCL ER 500 MG TB2: 500 | 30 days supply | Qty: 120 | Fill #7

## 2019-01-29 MED FILL — ALLOPURINOL 300 MG TABS: 300 | 30 days supply | Qty: 30 | Fill #8

## 2019-01-29 MED FILL — JANUVIA 100 MG TABLET: 100 | 30 days supply | Qty: 30 | Fill #3

## 2019-02-04 NOTE — Progress Notes (Signed)
Complete Physical  Assessment and Plan:  Encounter for general adult medical examination with abnormal findings 1 year  Coronary artery disease involving native coronary artery of native heart without angina pectoris Control blood pressure, cholesterol, glucose, increase exercise.  Follow up cardiology Get EKG Atypical, non exertional, no accompaniments Go to the ER if any chest pain, shortness of breath, nausea, dizziness, severe HA, changes vision/speech   Essential hypertension - continue medications, DASH diet, exercise and monitor at home. Call if greater than 130/80.  -     CBC with Differential/Platelet -     BASIC METABOLIC PANEL WITH GFR -     Hepatic function panel -     TSH -     Urinalysis, Routine w reflex microscopic -     Microalbumin / creatinine urine ratio -     EKG 12-Lead  Type 2 diabetes mellitus without complication, without long-term current use of insulin ([place]) Discussed general issues about diabetes pathophysiology and management., Educational material distributed., Suggested low cholesterol diet., Encouraged aerobic exercise., Discussed foot care., Reminded to get yearly retinal exam. -     Hemoglobin A1c  Parkinson disease ([place]) Continue neuro follow up and meds  Hyperlipidemia, unspecified hyperlipidemia type -continue medications, check lipids, decrease fatty foods, increase activity.  -     Lipid panel  Class 3 obesity due to excess calories with serious comorbidity and body mass index (BMI) of 45.0 to 49.9 in adult [facility]) - long discussion about weight loss, diet, and exercise -     Lipid panel -     Hemoglobin A1c  Medication management -     Magnesium  Vitamin D deficiency -     VITAMIN D 25 Hydroxy (Vit-D Deficiency, Fractures)  Seasonal and perennial allergic rhinitis - Allegra OTC, increase H20, allergy hygiene explained.  Asthma, mild intermittent, well-controlled Weight loss   Pseudomonas aeruginosa colonization Monitor  REM  sleep behavior disorder continue neuro follow up  Screening for prostate cancer -     PSA  OSA treated with BiPAP Sleep apnea- continue CPAP, CPAP is helping with daytime fatigue, weight loss still advised.   Uncontrolled secondary diabetes mellitus with stage 3 CKD (GFR 30-59) (HCC) Discussed general issues about diabetes pathophysiology and management., Educational material distributed., Suggested low cholesterol diet., Encouraged aerobic exercise., Discussed foot care., Reminded to get yearly retinal exam.  Controlled type 2 diabetes mellitus with diabetic dermatitis, without long-term current use of insulin ([place]) Discussed general issues about diabetes pathophysiology and management., Educational material distributed., Suggested low cholesterol diet., Encouraged aerobic exercise., Discussed foot care., Reminded to get yearly retinal exam.  Depression Dicussed counseling, increase exercise, discussed possible celexa versus wellbutrin start but wants to wait, will message, has follow up in Dec.   Discussed med's effects and SE's. Screening labs and tests as requested with regular follow-up as recommended.  [name]   States he has had intermittent sharp shooting chest pain that goes across his chest, last for seconds, no radiation into neck or arms, with rest, no accompaniments.  He has been having heart burn, will have some trouble swallowing meats and breads occ. Normal stress test 2013.   His blood pressure has been controlled at home, today their BP is   He does workout. He denies chest pain, shortness of breath, dizziness.  BMI is Body mass index is 43.03 kg/m., he is working on diet and exercise. He is on BiPAP for OSA. He is down 10 lbs from  April.  Wt Readings from Last 3 Encounters:  02/06/19 283 lb (128.4 kg)  09/26/18 285 lb 12.8 oz (129.6 kg)  08/28/18 293 lb (132.9 kg)    He had MRCP with Dr. Ardis Hughs for pancreatic cyst in May  2020, and will have repeat in 2 years. He is following with nephrology for possible RCC, it shrunk on its own- continuing to monitor.   Patient reports that he has been following with neurology for parkinson. He reports that he has been taking the mirapex and sinemet but has noticed a change in his left hand with worsening tremors, has follow up 5th of next month.   He is on miralax once a day for constipation.   He is on cholesterol medication and denies myalgias. His cholesterol is not at goal of 70 or less. The cholesterol last visit was:   Lab Results  Component Value Date   CHOL 186 09/26/2018   HDL 28 (L) 09/26/2018   [place]  09/26/2018     Comment:     . LDL cholesterol not calculated. Triglyceride levels greater than 400 mg/dL invalidate calculated LDL results. . Reference range: <100 . Desirable range <100 mg/dL for primary prevention;   <70 mg/dL for patients with CHD or diabetic patients  with > or = 2 CHD risk factors. Marland Kitchen LDL-C is now calculated using the Martin-Hopkins  calculation, which is a validated novel method providing  better accuracy than the Friedewald equation in the  estimation of LDL-C.  Cresenciano Genre et al. Annamaria Helling. [id number]): 2061-2068  (http://education.[employer].com/faq/FAQ164)    TRIG 434 (H) 09/26/2018   CHOLHDL 6.6 (H) 09/26/2018   He has been working on diet and exercise for diabetes CKD, he is on ARB Hyperlipemia on crestor Aortic atherosclerosis he is on bASA he is on metformin 4 a day  januvia at lunch Only takes glipizide if his sugar is below 200.  His sugars have been running 130-140, lowest is 88, highest is 180.  Has hypoglycemic awareness Eye Exam April 2020 Dr. Sherral Hammers, [facility]- will get papers  denies foot ulcerations, hyperglycemia, hypoglycemia , increased appetite, nausea, paresthesia of the feet, polydipsia, polyuria, visual disturbances, vomiting and weight loss.  Last A1C in the office was:  Lab Results   Component Value Date   HGBA1C 7.5 (H) 09/26/2018   Lab Results  Component Value Date   GFRNONAA 69 09/26/2018   Patient is on Vitamin D supplement.   Lab Results  Component Value Date   VD25OH 61 06/10/2018   Last PSA was, checks with Dr. Tresa Moore.  Lab Results  Component Value Date   PSA 3.7 01/21/2018   Current Medications:   Current Outpatient Medications (Endocrine & Metabolic):  .  glipiZIDE (GLUCOTROL) 5 MG tablet, Take 1 tablet (5 mg total) by mouth 2 (two) times daily before a meal. .  metFORMIN (GLUCOPHAGE-XR) 500 MG 24 hr tablet, TAKE 2 TABLETS BY MOUTH 2 TIMES A DAY FOR DIABETES .  predniSONE (DELTASONE) 20 MG tablet, 1 tab 3 x day for 2 days, then 2 x day for 2 days, then 1 x day for 3 days .  sitaGLIPtin (JANUVIA) 100 MG tablet, TAKE 1 TABLET BY MOUTH ONCE DAILY FOR DIABETES  Current Outpatient Medications (Cardiovascular):  .  rosuvastatin (CRESTOR) 40 MG tablet, Take 1 tablet (40 mg total) by mouth daily. .  valsartan-hydrochlorothiazide (DIOVAN-HCT) 320-25 MG tablet, TAKE 1 TABLET BY MOUTH DAILY.  Current Outpatient Medications (Respiratory):  Marland Kitchen  VENTOLIN HFA 108 (90 Base)  MCG/ACT inhaler, INHALE 2 PUFFS INTO THE LUNGS EVERY 6 HOURS AS NEEDED FOR WHEEZING OR SHORTNESS OF BREATH.  Current Outpatient Medications (Analgesics):  .  allopurinol (ZYLOPRIM) 300 MG tablet, TAKE 1 TABLET BY MOUTH ONCE DAILY .  aspirin EC 81 MG tablet, Take 81 mg by mouth at bedtime. .  traMADol (ULTRAM) 50 MG tablet, Take 1/2 to 1 tablet every 4 hours as needed for Severe pain   Current Outpatient Medications (Other):  Marland Kitchen  ALPRAZolam (XANAX) 0.5 MG tablet, Take 1 tablet (0.5 mg total) by mouth 3 (three) times daily. Marland Kitchen  buPROPion (WELLBUTRIN XL) 150 MG 24 hr tablet, Take 1 tablet (150 mg total) by mouth every morning. .  carbidopa-levodopa (SINEMET IR) 25-100 MG tablet, Take 2 tablets by mouth 3 (three) times daily. .  cyclobenzaprine (FLEXERIL) 10 MG tablet, Take 1/2 to 1 tablet 3 x  /day as needed for Muscle Spasm .  pramipexole (MIRAPEX) 1 MG tablet, Take 1 tablet (1 mg total) by mouth 3 (three) times daily. .  vitamin C (ASCORBIC ACID) 500 MG tablet, Take 1 tablet (500 mg total) by mouth 3 (three) times daily. .  Vitamin D, Ergocalciferol, (DRISDOL) 50000 units CAPS capsule, Take 50,000 Units by mouth every 7 (seven) days.  Health Maintenance:  Immunization History  Administered Date(s) Administered  . Influenza Inj Mdck Quad With Preservative 02/22/2017, 01/21/2018  . Influenza-Unspecified 02/03/2013, 02/05/2014  . Pneumococcal-Unspecified 11/05/2012  . Tdap 03/26/2015   Tetanus: 2016 Pneumovax: 2014 Flu vaccine: DUE  Colonoscopy: 2014 Eye Exam: Dr. Clydene Laming April 2018 Sleep study 2017 CTA chest 2014 CXR 06/2018 MRI brain 2017 Stress test 2013 MRCP 09/2018 IMPRESSION: 1.4 cm simple appearing cystic lesion in pancreatic body, mildly increased in since prior studies dating back to 2015. Smaller cystic lesions in the pancreatic body and tail appear to have been present on prior MRI although less well visualized due to their small size. 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 Consider continued follow-up by MRI in 1 year or endoscopic ultrasound with FNA. This recommendation follows ACR consensus guidelines: Management of Incidental Pancreatic Cysts: A White Paper of the ACR Incidental Findings Committee. [employer] [id number].  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  No evidence of abdominal metastatic disease.  Moderate hepatic steatosis.  Cholelithiasis.  No radiographic evidence of cholecystitis.   [name], [name] Keene Breath., [name], [name], MD (Inactive) as Consulting Physician (Gastroenterology) Rolene Course, PA-C as Physician Assistant (Emergency Medicine)   Medical History:  Past Medical History:  Diagnosis Date  . Adult BMI 50.0-59.9 kg/sq m    52.77  . Anal fissure   . Anxiety   . Diabetes mellitus without complication (HCC)    borderline  . GERD (gastroesophageal reflux disease)    30 years ago, maybe from peptic ulcer  . H/O: gout    STABLE  . Hepatic steatosis 10/01/11  . History of kidney stones   . Hyperlipidemia   . Hypertension   . Kidney tumor    right upper kidney tumor, monitoring  . OSA on CPAP    [facility]  . Parkinson disease ([place]) 01/04/2016  . REM sleep behavior disorder 12/06/2016  . Right ureteral stone   . Ureteral calculus, right 06/12/2011  . Vitamin D deficiency   . Weakness    Allergies  Allergies  Allergen Reactions  . Ciprofloxacin Hives  . Ceftriaxone Hives    SURGICAL HISTORY He  has a past surgical history that includes Anal fissure repair (10-12-2000); Nasal septum surgery (1985); LEFT MEDIAL FEMORAL CONDYLE DEBRIDEMENT & DRILLING/ REMOVAL LOOSE BODY (09-15-2003); Stone extraction with basket (06/12/2011); kidney stone removal; Colonoscopy (N/A, 07/01/2012); Cystoscopy with retrograde pyelogram, ureteroscopy and stent placement (Bilateral, 02/26/2013); and Holmium laser application (Bilateral, 02/26/2013). FAMILY HISTORY His family history includes Cancer in his father; Heart disease in his maternal aunt, mother, and paternal grandmother; Hyperlipidemia in his brother, maternal aunt, mother, and paternal grandmother; Hypertension in his maternal aunt and mother; Lung cancer in his father; Stroke in his maternal aunt. SOCIAL HISTORY He  reports that he has never smoked. He has never used smokeless tobacco. He reports that he does not drink alcohol or use drugs.  Review of Systems:  Review of Systems  Constitutional: Positive for malaise/fatigue. Negative for chills, diaphoresis, fever and weight loss.  HENT: Negative for congestion, ear  pain, hearing loss and sore throat.   Eyes: Negative.   Respiratory: Negative for cough, shortness of breath and wheezing.   Cardiovascular: Negative for chest pain, palpitations, orthopnea, claudication, leg swelling and PND.  Gastrointestinal: Negative for blood in stool, constipation, diarrhea, heartburn and melena.  Genitourinary: Negative.   Skin: Negative.   Neurological: Positive for sensory change and focal weakness. Negative for dizziness, loss of consciousness and headaches.  Psychiatric/Behavioral: Negative for depression and memory loss. The patient is not nervous/anxious and does not have insomnia.     Physical Exam: Estimated body mass index is 44.76 kg/m as calculated from the following:   Height as of 09/26/18: 5\' 7"  (1.702 m).   Weight as of 09/26/18: 285 lb 12.8 oz (129.6 kg). There were no vitals taken for this visit.  General Appearance: Well nourished, in no apparent distress.  Eyes: PERRLA, EOMs, conjunctiva no swelling or erythema ENT/Mouth: Ear canals clear bilaterally with no erythema, swelling, discharge.  TMs normal bilaterally with no erythema, bulging, or retractions.  Oropharynx clear and moist with no exudate, swelling, or erythema.  Dentition normal.   Neck: Supple, thyroid normal. No bruits, JVD, cervical adenopathy Respiratory: Respiratory effort normal, BS equal bilaterally without rales, rhonchi, wheezing or stridor.  Cardio: RRR without murmurs, rubs or gallops. Brisk peripheral pulses without edema.  Chest: symmetric, with normal excursions Abdomen: Soft, nontender, no guarding, rebound, hernias, masses, or organomegaly. Genitourinary:  Musculoskeletal: Full ROM all peripheral extremities,5/5 strength, and normal gait.  Skin: Warm, dry without rashes, lesions, ecchymosis. Neuro: A&Ox3, Cranial nerves intact, slow gait.  Overshoot of the right hand with finger to nose.  Tremor of the left hand with finger opposition>tremor of the right hand with finger  opposition.    Psych: Flat affect, Insight and Judgment appropriate.   EKG: WNL no changes.  Over 40 minutes of exam, counseling, chart review and critical decision making was performed  Vicie Mutters 10:21 AM [facility]

## 2019-02-06 ENCOUNTER — Other Ambulatory Visit: Payer: Self-pay

## 2019-02-06 ENCOUNTER — Encounter: Payer: Self-pay | Admitting: Physician Assistant

## 2019-02-06 ENCOUNTER — Ambulatory Visit: Payer: 59 | Admitting: Physician Assistant

## 2019-02-06 VITALS — BP 124/80 | HR 78 | Temp 97.7°F | Ht 68.0 in | Wt 283.0 lb

## 2019-02-06 DIAGNOSIS — G20 Parkinson's disease: Secondary | ICD-10-CM

## 2019-02-06 DIAGNOSIS — IMO0002 Reserved for concepts with insufficient information to code with codable children: Secondary | ICD-10-CM

## 2019-02-06 DIAGNOSIS — I2510 Atherosclerotic heart disease of native coronary artery without angina pectoris: Secondary | ICD-10-CM

## 2019-02-06 DIAGNOSIS — Z23 Encounter for immunization: Secondary | ICD-10-CM | POA: Diagnosis not present

## 2019-02-06 DIAGNOSIS — J4520 Mild intermittent asthma, uncomplicated: Secondary | ICD-10-CM

## 2019-02-06 DIAGNOSIS — I10 Essential (primary) hypertension: Secondary | ICD-10-CM

## 2019-02-06 DIAGNOSIS — E1322 Other specified diabetes mellitus with diabetic chronic kidney disease: Secondary | ICD-10-CM

## 2019-02-06 DIAGNOSIS — E11620 Type 2 diabetes mellitus with diabetic dermatitis: Secondary | ICD-10-CM

## 2019-02-06 DIAGNOSIS — I700 Atherosclerosis of aorta: Secondary | ICD-10-CM

## 2019-02-06 DIAGNOSIS — G4752 REM sleep behavior disorder: Secondary | ICD-10-CM

## 2019-02-06 DIAGNOSIS — G4733 Obstructive sleep apnea (adult) (pediatric): Secondary | ICD-10-CM

## 2019-02-06 DIAGNOSIS — Z79899 Other long term (current) drug therapy: Secondary | ICD-10-CM

## 2019-02-06 DIAGNOSIS — Z0000 Encounter for general adult medical examination without abnormal findings: Secondary | ICD-10-CM

## 2019-02-06 DIAGNOSIS — E559 Vitamin D deficiency, unspecified: Secondary | ICD-10-CM

## 2019-02-06 DIAGNOSIS — Z130 Encounter for screening for diseases of the blood and blood-forming organs and certain disorders involving the immune mechanism: Secondary | ICD-10-CM

## 2019-02-06 DIAGNOSIS — Z0001 Encounter for general adult medical examination with abnormal findings: Secondary | ICD-10-CM

## 2019-02-06 DIAGNOSIS — J302 Other seasonal allergic rhinitis: Secondary | ICD-10-CM

## 2019-02-06 DIAGNOSIS — Z125 Encounter for screening for malignant neoplasm of prostate: Secondary | ICD-10-CM

## 2019-02-06 DIAGNOSIS — E785 Hyperlipidemia, unspecified: Secondary | ICD-10-CM

## 2019-02-06 DIAGNOSIS — R300 Dysuria: Secondary | ICD-10-CM

## 2019-02-06 NOTE — Patient Instructions (Addendum)
RANGE OF A1C   Your A1C is a measure of your sugar over the past 3 months and is not affected by what you have eaten over the past few days. Diabetes increases your chances of stroke and heart attack over 300 % and is the leading cause of blindness and kidney failure in the [place]. Please make sure you decrease bad carbs like white bread, white rice, potatoes, corn, soft drinks, pasta, cereals, refined sugars, sweet tea, dried fruits, and fruit juice. Good carbs are okay to eat in moderation like sweet potatoes, brown rice, whole grain pasta/bread, most fruit (except dried fruit) and you can eat as many veggies as you want.   Greater than 6.5 is considered diabetic. Between 6.4 and 5.7 is prediabetic If your A1C is less than 5.7 you are NOT diabetic.  Targets for Glucose Readings: Time of Check Target for patients WITHOUT Diabetes Target for DIABETICS  Before Meals Less than 100  less than 150  Two hours after meals Less than 200  Less than 250      Diabetes or even increased sugars put you at 300% increased risk of heart attack and stroke.  ALSO BEING DIABETIC YOU MAY NOT HAVE ANY PAIN WITH A HEART ATTACK.  Even worse of a chance of no pain if you are a woman.  It is very unlikely that you will have any pain with a heart attack. Likely your symptoms will be very subtle, even for very severe disease.  Your symptoms for a heart attack will likely occur when you exert your self or exercise and include: Shortness of breath Sweating Nausea Dizziness Fast or irregular heart beats Fatigue   It makes me feel better if my diabetics get their heart rate up with exercise once or twice a week and pay close attention to your body. If there is ANY change in your exercise capacity or if you have symptoms above, please STOP and call 911 or call to come to the office.   PLEASE REMEMBER:  Diabetes is preventable! Up to 74 percent of complications and morbidities among individuals with type 2  diabetes can be prevented, delayed, or effectively treated and minimized with regular visits to a health professional, appropriate monitoring and medication, and a healthy diet and lifestyle.   Here is some information to help you keep your heart healthy: Move it! - Aim for 30 mins of activity every day. Take it slowly at first. Talk to [place] before starting any new exercise program.   Lose it.  -Body Mass Index (BMI) can indicate if you need to lose weight. A healthy range is 18.5-24.9. For a BMI calculator, go to [employer]  Waist Management -Excess abdominal fat is a risk factor for heart disease, diabetes, asthma, stroke and more. Ideal waist circumference is less than 35" for women and less than 40" for men.   Eat Right -focus on fruits, vegetables, whole grains, and meals you make yourself. Avoid foods with trans fat and high sugar/sodium content.   Snooze or Snore? - Loud snoring can be a sign of sleep apnea, a significant risk factor for high blood pressure, heart attach, stroke, and heart arrhythmias.  Kick the habit -Quit Smoking! Avoid second hand smoke. A single cigarette raises your blood pressure for 20 mins and increases the risk of heart attack and stroke for the next 24 hours.   Are Aspirin and Supplements right for you? -Add ENTERIC COATED low dose 81 mg Aspirin daily OR can do every  other day if you have easy bruising to protect your heart and head. As well as to reduce risk of Colon Cancer by 20 %, Skin Cancer by 26 % , Melanoma by 46% and Pancreatic cancer by 60%  Say "No to Stress -There may be little you can do about problems that cause stress. However, techniques such as long walks, meditation, and exercise can help you manage it.   Start Now! - Make changes one at a time and set reasonable goals to increase your likelihood of success.    Get on pepcid over the counter once a day, if not better follow up Dr. Ardis Hughs, may need EGD or procedure to look at  esophagus and need stretching.   Silent reflux: Not all heartburn burns...Marland KitchenMarland KitchenMarland Kitchen  What is LPR? Laryngopharyngeal reflux (LPR) or silent reflux is a condition in which acid that is made in the stomach travels up the esophagus (swallowing tube) and gets to the throat. Not everyone with reflux has a lot of heartburn or indigestion. In fact, many people with LPR never have heartburn. This is why LPR is called SILENT REFLUX, and the terms "Silent reflux" and "LPR" are often used interchangeably. Because LPR is silent, it is sometimes difficult to diagnose.  How can you tell if you have LPR?  Marland Kitchen Chronic hoarseness- Some people have hoarseness that comes and goes . throat clearing  . Cough . It can cause shortness of breath and cause asthma like symptoms. Marland Kitchen a feeling of a lump in the throat  . difficulty swallowing . a problem with too much nose and throat drainage.  . Some people will feel their esophagus spasm which feels like their heart beating hard and fast, this will usually be after a meal, at rest, or lying down at night.    How do I treat this? Treatment for LPR should be individualized, and your doctor will suggest the best treatment for you. Generally there are several treatments for LPR: . changing habits and diet to reduce reflux,  . medications to reduce stomach acid, and  . surgery to prevent reflux. Most people with LPR need to modify how and when they eat, as well as take some medication, to get well. Sometimes, nonprescription liquid antacids, such as Maalox, Gelucil and Mylanta are recommended. When used, these antacids should be taken four times each day - one tablespoon one hour after each meal and before bedtime. Dietary and lifestyle changes alone are not often enough to control LPR - medications that reduce stomach acid are also usually needed. These must be prescribed by our doctor.   TIPS FOR REDUCING REFLUX AND LPR Control your LIFE-STYLE and your DIET! Marland Kitchen If you use  tobacco, QUIT.  Marland Kitchen Smoking makes you reflux. After every cigarette you have some LPR.  . Don't wear clothing that is too tight, especially around the waist (trousers, corsets, belts).  . Do not lie down just after eating...in fact, do not eat within three hours of bedtime.  . You should be on a low-fat diet.  . Limit your intake of red meat.  . Limit your intake of butter.  Marland Kitchen Avoid fried foods.  . Avoid chocolate  . Avoid cheese.  Marland Kitchen Avoid eggs. Marland Kitchen Specifically avoid caffeine (especially coffee and tea), soda pop (especially cola) and mints.  . Avoid alcoholic beverages, particularly in the evening.

## 2019-02-07 ENCOUNTER — Other Ambulatory Visit: Payer: Self-pay | Admitting: Physician Assistant

## 2019-02-07 DIAGNOSIS — R300 Dysuria: Secondary | ICD-10-CM

## 2019-02-07 LAB — CBC WITH DIFFERENTIAL/PLATELET
Absolute Monocytes: 808 cells/uL (ref 200–950)
Basophils Absolute: 43 cells/uL (ref 0–200)
Basophils Relative: 0.5 %
Eosinophils Absolute: 94 cells/uL (ref 15–500)
Eosinophils Relative: 1.1 %
HCT: 44 % (ref 38.5–50.0)
Hemoglobin: 15 g/dL (ref 13.2–17.1)
Lymphs Abs: 1972 cells/uL (ref 850–3900)
MCH: 28.2 pg (ref 27.0–33.0)
MCHC: 34.1 g/dL (ref 32.0–36.0)
MCV: 82.9 fL (ref 80.0–100.0)
MPV: 11 fL (ref 7.5–12.5)
Monocytes Relative: 9.5 %
Neutro Abs: 5585 cells/uL (ref 1500–7800)
Neutrophils Relative %: 65.7 %
Platelets: 186 10*3/uL (ref 140–400)
RBC: 5.31 10*6/uL (ref 4.20–5.80)
RDW: 13.9 % (ref 11.0–15.0)
Total Lymphocyte: 23.2 %
WBC: 8.5 10*3/uL (ref 3.8–10.8)

## 2019-02-07 LAB — URINALYSIS, ROUTINE W REFLEX MICROSCOPIC
Bilirubin Urine: NEGATIVE
Glucose, UA: NEGATIVE
Hgb urine dipstick: NEGATIVE
Hyaline Cast: NONE SEEN /LPF
Ketones, ur: NEGATIVE
Nitrite: POSITIVE — AB
Protein, ur: NEGATIVE
RBC / HPF: NONE SEEN /HPF (ref 0–2)
Specific Gravity, Urine: 1.021 (ref 1.001–1.03)
Squamous Epithelial / HPF: NONE SEEN /HPF (ref <=5)
pH: 6 (ref 5.0–8.0)

## 2019-02-07 LAB — COMPLETE METABOLIC PANEL WITH GFR
AG Ratio: 1.4 (calc) (ref 1.0–2.5)
ALT: 12 U/L (ref 9–46)
AST: 17 U/L (ref 10–35)
Albumin: 4.2 g/dL (ref 3.6–5.1)
Alkaline phosphatase (APISO): 48 U/L (ref 35–144)
BUN: 24 mg/dL (ref 7–25)
CO2: 27 mmol/L (ref 20–32)
Calcium: 10.4 mg/dL — ABNORMAL HIGH (ref 8.6–10.3)
Chloride: 99 mmol/L (ref 98–110)
Creat: 1.25 mg/dL (ref 0.70–1.25)
GFR, Est African American: 70 mL/min/{1.73_m2} (ref >=60)
GFR, Est Non African American: 60 mL/min/{1.73_m2} (ref >=60)
Globulin: 3 g/dL (calc) (ref 1.9–3.7)
Glucose, Bld: 153 mg/dL — ABNORMAL HIGH (ref 65–99)
Potassium: 4.2 mmol/L (ref 3.5–5.3)
Sodium: 137 mmol/L (ref 135–146)
Total Bilirubin: 0.6 mg/dL (ref 0.2–1.2)
Total Protein: 7.2 g/dL (ref 6.1–8.1)

## 2019-02-07 LAB — HEMOGLOBIN A1C
Hgb A1c MFr Bld: 7.2 % of total Hgb — ABNORMAL HIGH (ref <5.7)
Mean Plasma Glucose: 160 (calc)
eAG (mmol/L): 8.9 (calc)

## 2019-02-07 LAB — LIPID PANEL
Cholesterol: 177 mg/dL (ref <200)
HDL: 32 mg/dL — ABNORMAL LOW (ref >=40)
LDL Cholesterol (Calc): 98 mg/dL (calc)
Non-HDL Cholesterol (Calc): 145 mg/dL (calc) — ABNORMAL HIGH (ref <130)
Total CHOL/HDL Ratio: 5.5 (calc) — ABNORMAL HIGH (ref <5.0)
Triglycerides: 353 mg/dL — ABNORMAL HIGH (ref <150)

## 2019-02-07 LAB — MICROALBUMIN / CREATININE URINE RATIO
Creatinine, Urine: 174 mg/dL (ref 20–320)
Microalb Creat Ratio: 4 mcg/mg creat (ref <30)
Microalb, Ur: 0.7 mg/dL

## 2019-02-07 LAB — MAGNESIUM: Magnesium: 1.7 mg/dL (ref 1.5–2.5)

## 2019-02-07 LAB — TSH: TSH: 2.52 mIU/L (ref 0.40–4.50)

## 2019-02-07 LAB — VITAMIN D 25 HYDROXY (VIT D DEFICIENCY, FRACTURES): Vit D, 25-Hydroxy: 72 ng/mL (ref 30–100)

## 2019-02-07 NOTE — Addendum Note (Signed)
Addended by: Eulis Canner on: 02/07/2019 08:49 AM   Modules accepted: Orders

## 2019-02-09 LAB — URINE CULTURE
MICRO NUMBER:: 949825
SPECIMEN QUALITY:: ADEQUATE

## 2019-02-12 MED FILL — CARBIDOPA-LEVODOPA 25-100 T: 25-100 | 30 days supply | Qty: 180 | Fill #4

## 2019-03-03 ENCOUNTER — Other Ambulatory Visit: Payer: Self-pay | Admitting: Physician Assistant

## 2019-03-03 DIAGNOSIS — I10 Essential (primary) hypertension: Secondary | ICD-10-CM

## 2019-03-03 MED FILL — VENTOLIN HFA 90 MCG INHALER: 108 (90 BAS | 24 days supply | Qty: 18 | Fill #1

## 2019-03-03 MED FILL — ALLOPURINOL 300 MG TABS: 300 | 30 days supply | Qty: 30 | Fill #9

## 2019-03-03 MED FILL — ALPRAZolam 0.5 MG TABS: 0.5 | 30 days supply | Qty: 90 | Fill #2

## 2019-03-03 MED FILL — VALSARTAN-HCTZ 320-25 MG TA: 320-25 | 30 days supply | Qty: 30 | Fill #0

## 2019-03-03 MED FILL — JANUVIA 100 MG TABLET: 100 | 30 days supply | Qty: 30 | Fill #4

## 2019-03-03 MED FILL — metFORMIN HCL ER 500 MG TB2: 500 | 30 days supply | Qty: 120 | Fill #8

## 2019-03-06 ENCOUNTER — Other Ambulatory Visit: Payer: Self-pay

## 2019-03-06 ENCOUNTER — Ambulatory Visit: Payer: 59 | Admitting: Physical Therapy

## 2019-03-06 ENCOUNTER — Ambulatory Visit: Payer: 59 | Admitting: Occupational Therapy

## 2019-03-06 ENCOUNTER — Ambulatory Visit: Payer: 59 | Attending: Physician Assistant | Admitting: Speech Pathology

## 2019-03-06 DIAGNOSIS — R278 Other lack of coordination: Secondary | ICD-10-CM

## 2019-03-06 DIAGNOSIS — R471 Dysarthria and anarthria: Secondary | ICD-10-CM

## 2019-03-06 DIAGNOSIS — R2689 Other abnormalities of gait and mobility: Secondary | ICD-10-CM | POA: Insufficient documentation

## 2019-03-06 NOTE — Therapy (Signed)
[facility] [address] [place], [place], [address] Phone: [phone]   Fax:  [phone]  Patient Details  Name: Todd Rice MRN: [id number] Date of Birth: 1955-06-02 Referring Provider:  Unk Pinto, MD  Encounter Date: 03/06/2019   Occupational Therapy Parkinson's Disease Screen  Hand dominance:  right   Fastening/unfastening 3 buttons in:  49.15 sec  9-hole peg test:    RUE  35.75 sec        LUE  31.04 sec  Box & Blocks Test:   RUE  44 blocks        LUE  46 blocks  Change in ability to perform ADLs/IADLs:  Pt reports that he is noticing some tremors/difficulty with L hand (observed today). Pt reports incr difficulty picking up small pills/items.  Pt would benefit from occupational therapy evaluation due to  New difficulty/tremors with L hand and incr difficulty with fine motor coordination R hand.    [facility] 03/06/2019, 10:24 AM  [facility] [address] [place], [place], [address] Phone: [phone]   Fax:  [place], OTR/L [facility] [address] [place], [place]  [address] [phone] phone [phone] 03/06/19 10:27 AM

## 2019-03-06 NOTE — Patient Instructions (Signed)
   You are sounding OK - if you or Mrs. Cundari are getting frustrated with your speech and not being understood come on back  [place] job doing loud AH's - they sound great  I'm adding 10 minutes of reading aloud really focusing on feeling like you are too loud - take big breaths at the periods and commas (at least 3 days a week)

## 2019-03-06 NOTE — Therapy (Signed)
[facility] [address] [place], [place], [address] Phone: [phone]   Fax:  [phone]  Patient Details  Name: GERMAN CHAKRABORTY MRN: [id number] Date of Birth: [birth year] Referring Provider:  Unk Pinto, MD  Encounter Date: 03/06/2019  Physical Therapy Parkinson's Disease Screen   Timed Up and Go test: 12 sec  10 meter walk test: 10.41 sec (3.15 ft/sec)  5 time sit to stand test: 12.34 sec  Patient would benefit from Physical Therapy evaluation due to slowed mobility measures.      Freya Zobrist W. 03/06/2019, 10:31 AM  Frazier Butt., PT   [facility] [address] [place], [place], [address] Phone: [phone]   Fax:  [phone]

## 2019-03-06 NOTE — Therapy (Signed)
[facility] [address] [place], [place], [address] Phone: [phone]   Fax:  [phone]  Patient Details  Name: Todd Rice MRN: [id number] Date of Birth: 04-16-1956 Referring Provider:  Unk Pinto, MD  Encounter Date: 03/06/2019  Speech Therapy Parkinson's Disease Screen   Decibel Level today: 68 to 74dB  (WNL=70-72 dB) with sound level meter 30cm away from pt's mouth. Pt's conversational volume has remained stable since last treatment course  Pt has not experienced difficulty in swallowing warranting objective evaluation. Mild globus sensation intermittently, no overt s/s of aspiration  Pt denies difficulty or increased frustration from him or his spouse due to reduced intelligibility  Pt does does not require speech therapy services at this time. Recommend ST screen in another 6 months   Kimley Apsey, Annye Rusk MS, CCC-SLP 03/06/2019, 10:35 AM  [facility] [address] [place], [place], [address] Phone: [phone]   Fax:  [phone]

## 2019-03-10 ENCOUNTER — Other Ambulatory Visit: Payer: Self-pay | Admitting: Neurology

## 2019-03-10 ENCOUNTER — Ambulatory Visit: Payer: 59 | Attending: Internal Medicine | Admitting: Physical Therapy

## 2019-03-10 ENCOUNTER — Other Ambulatory Visit: Payer: Self-pay

## 2019-03-10 ENCOUNTER — Encounter: Payer: Self-pay | Admitting: Physical Therapy

## 2019-03-10 DIAGNOSIS — R293 Abnormal posture: Secondary | ICD-10-CM | POA: Insufficient documentation

## 2019-03-10 DIAGNOSIS — R278 Other lack of coordination: Secondary | ICD-10-CM | POA: Diagnosis present

## 2019-03-10 DIAGNOSIS — R29818 Other symptoms and signs involving the nervous system: Secondary | ICD-10-CM | POA: Insufficient documentation

## 2019-03-10 DIAGNOSIS — M6281 Muscle weakness (generalized): Secondary | ICD-10-CM | POA: Diagnosis present

## 2019-03-10 DIAGNOSIS — R2689 Other abnormalities of gait and mobility: Secondary | ICD-10-CM | POA: Diagnosis not present

## 2019-03-10 DIAGNOSIS — R2681 Unsteadiness on feet: Secondary | ICD-10-CM | POA: Diagnosis present

## 2019-03-10 DIAGNOSIS — R29898 Other symptoms and signs involving the musculoskeletal system: Secondary | ICD-10-CM | POA: Diagnosis present

## 2019-03-10 DIAGNOSIS — G20 Parkinson's disease: Secondary | ICD-10-CM

## 2019-03-10 NOTE — Therapy (Signed)
[facility] [address] [place], [place], [address] Phone: [phone]   Fax:  [phone]  Physical Therapy Evaluation  Patient Details  Name: Todd Rice MRN: [id number] Date of Birth: 09/29/1955 Referring Provider (PT): Lenor Coffin   Encounter Date: 03/10/2019  PT End of Session - 03/10/19 0945    Visit Number  1   New eval/episode of care, 03/10/2019   Number of Visits  8    Authorization Type  [employer]    PT Start Time  0715    PT Stop Time  0755    PT Time Calculation (min)  40 min    Activity Tolerance  Patient tolerated treatment well    Behavior During Therapy  [facility] for tasks assessed/performed       Past Medical History:  Diagnosis Date  . Adult BMI 50.0-59.9 kg/sq m    52.77  . Anal fissure   . Anxiety   . Diabetes mellitus without complication (HCC)    borderline  . GERD (gastroesophageal reflux disease)    30 years ago, maybe from peptic ulcer  . H/O: gout    STABLE  . Hepatic steatosis 10/01/11  . History of kidney stones   . Hyperlipidemia   . Hypertension   . Kidney tumor    right upper kidney tumor, monitoring  . OSA on CPAP    [facility]  . Parkinson disease ([place]) 01/04/2016  . REM sleep behavior disorder 12/06/2016  . Right ureteral stone   . Ureteral calculus, right 06/12/2011  . Vitamin D deficiency   . Weakness     Past Surgical History:  Procedure Laterality Date  . ANAL FISSURE REPAIR  10-12-2000  . COLONOSCOPY N/A 07/01/2012   Procedure: COLONOSCOPY;  Surgeon: Lafayette Dragon, MD;  Location: [facility];  Service: Endoscopy;  Laterality: N/A;  . CYSTOSCOPY WITH RETROGRADE PYELOGRAM, URETEROSCOPY AND STENT PLACEMENT Bilateral 02/26/2013   Procedure: CYSTOSCOPY WITH RETROGRADE PYELOGRAM, URETEROSCOPY AND STENT PLACEMENT;  Surgeon: Alexis Frock, MD;  Location: [facility];  Service: Urology;  Laterality: Bilateral;  . HOLMIUM LASER APPLICATION Bilateral [id number]    Procedure: HOLMIUM LASER APPLICATION;  Surgeon: Alexis Frock, MD;  Location: [facility];  Service: Urology;  Laterality: Bilateral;  . kidney stone removal     06/2011-stent placement  . LEFT MEDIAL FEMORAL CONDYLE DEBRIDEMENT & DRILLING/ REMOVAL LOOSE BODY  09-15-2003  . NASAL SEPTUM SURGERY  1985  . STONE EXTRACTION WITH BASKET  06/12/2011   Procedure: STONE EXTRACTION WITH BASKET;  Surgeon: Claybon Jabs, MD;  Location: [facility];  Service: Urology;  Laterality: Right;    There were no vitals filed for this visit.   Subjective Assessment - 03/10/19 0719    Subjective  Have had no falls, but sometimes I feel wobbly when I'm standing.  Feel tight through my shoulders, some days more than others.  Does walking and yardwork tasks, but not specifically doing exercises.    Patient Stated Goals  Pt's goal for therapy is to not fall.    Currently in Pain?  No/denies         [facility] PT Assessment - 03/10/19 [id number]      Assessment   Medical Diagnosis  Parkinson's disease    Referring Provider (PT)  Lenor Coffin    Onset Date/Surgical Date  03/06/19   PD screen   Hand Dominance  Right      Precautions   Precautions  Fall  Balance Screen   Has the patient fallen in the past 6 months  No    Has the patient had a decrease in activity level because of a fear of falling?   No    Is the patient reluctant to leave their home because of a fear of falling?   No      Home Social worker  Private residence    Living Arrangements  Spouse/significant other    Available Help at Discharge  Family    Type of [place] to enter    Entrance Stairs-Number of Steps  1    Entrance Stairs-Rails  None    Home Layout  Two level;Able to live on main level with bedroom/bathroom      Prior Function   Level of Independence  Independent    Vocation  Retired    Leisure  Walks the dog, walks with wife, does and yard chores       Observation/Other Assessments   Focus on Therapeutic Outcomes (FOTO)   NA      Posture/Postural Control   Posture/Postural Control  Postural limitations    Postural Limitations  Rounded Shoulders;Forward head      Tone   Assessment Location  Right Lower Extremity;Left Lower Extremity      ROM / Strength   AROM / PROM / Strength  Strength      Strength   Overall Strength  Deficits    Overall Strength Comments  Grossly tested 5/5 except R quads 4/5      Transfers   Transfers  Sit to Stand;Stand to Sit    Sit to Stand  6: Modified independent (Device/Increase time);Without upper extremity assist;From chair/3-in-1    Five time sit to stand comments   11.35    Stand to Sit  6: Modified independent (Device/Increase time);Without upper extremity assist;To chair/3-in-1      Ambulation/Gait   Ambulation/Gait  Yes    Ambulation/Gait Assistance  7: Independent    Ambulation Distance (Feet)  614 Feet   200   Assistive device  None    Gait Pattern  Step-through pattern;Decreased arm swing - right;Decreased arm swing - left;Decreased dorsiflexion - right;Decreased step length - right;Decreased trunk rotation    Ambulation Surface  Level;Indoor    Gait velocity  10.44 sec =3.14 ft/sec    Gait Comments  2 min walk distance, 298 ft      6 minute walk test results    Aerobic Endurance Distance Walked  614    Endurance additional comments  in 3 minute walk test      Standardized Balance Assessment   Standardized Balance Assessment  Timed Up and Go Test      Timed Up and Go Test   Normal TUG (seconds)  12.5    Manual TUG (seconds)  13.03    Cognitive TUG (seconds)  11.43      High Level Balance   High Level Balance Comments  MiniBestest:  23/28 (see full note for details )      RLE Tone   RLE Tone  Mild      LLE Tone   LLE Tone  Mild       Mini-BESTest: Balance Evaluation Systems Test  2005-2013 [place]. All rights  reserved. ________________________________________________________________________________________Anticipatory_________Subscore___5__/6 1. SIT TO STAND Instruction: "Cross your arms across your chest. Try not to use your hands unless you must.Do not let your legs  lean against the back of the chair when you stand. Please stand up now." X(2) Normal: Comes to stand without use of hands and stabilizes independently. (1) Moderate: Comes to stand WITH use of hands on first attempt. (0) Severe: Unable to stand up from chair without assistance, OR needs several attempts with use of hands. 2. RISE TO TOES Instruction: "Place your feet shoulder width apart. Place your hands on your hips. Try to rise as high as you can onto your toes. I will count out loud to 3 seconds. Try to hold this pose for at least 3 seconds. Look straight ahead. Rise now." X(2) Normal: Stable for 3 s with maximum height. (1) Moderate: Heels up, but not full range (smaller than when holding hands), OR noticeable instability for 3 s. (0) Severe: < 3 s. 3. STAND ON ONE LEG Instruction: "Look straight ahead. Keep your hands on your hips. Lift your leg off of the ground behind you without touching or resting your raised leg upon your other standing leg. Stay standing on one leg as long as you can. Look straight ahead. Lift now." Left: Time in Seconds Trial 1:__4.19___Trial 2:__4.37___ (2) Normal: 20 s. X(1) Moderate: < 20 s. (0) Severe: Unable. Right: Time in Seconds Trial 1:__3.91___Trial 2:__1.72___ (2) Normal: 20 s. X(1) Moderate: < 20 s. (0) Severe: Unable To score each side separately use the trial with the longest time. To calculate the sub-score and total score use the side [left or right] with the lowest numerical score [i.e. the worse side]. ______________________________________________________________________________________Reactive Postural Control___________Subscore:___4__/6 4. COMPENSATORY STEPPING CORRECTION-  FORWARD Instruction: "Stand with your feet shoulder width apart, arms at your sides. Lean forward against my hands beyond your forward limits. When I let go, do whatever is necessary, including taking a step, to avoid a fall." (2) Normal: Recovers independently with a single, large step (second realignment step is allowed). (1) Moderate: More than one step used to recover equilibrium. X(0) Severe: No step, OR would fall if not caught, OR falls spontaneously. 5. COMPENSATORY STEPPING CORRECTION- BACKWARD Instruction: "Stand with your feet shoulder width apart, arms at your sides. Lean backward against my hands beyond your backward limits. When I let go, do whatever is necessary, including taking a step, to avoid a fall." X(2) Normal: Recovers independently with a single, large step. (1) Moderate: More than one step used to recover equilibrium. (0) Severe: No step, OR would fall if not caught, OR falls spontaneously. 6. COMPENSATORY STEPPING CORRECTION- LATERAL Instruction: "Stand with your feet together, arms down at your sides. Lean into my hand beyond your sideways limit. When I let go, do whatever is necessary, including taking a step, to avoid a fall." Left X(2) Normal: Recovers independently with 1 step (crossover or lateral OK). (1) Moderate: Several steps to recover equilibrium. (0) Severe: Falls, or cannot step. Right X(2) Normal: Recovers independently with 1 step (crossover or lateral OK). (1) Moderate: Several steps to recover equilibrium. (0) Severe: Falls, or cannot step. Use the side with the lowest score to calculate sub-score and total score. ____________________________________________________________________________________Sensory Orientation_____________Subscore:______6___/6 7. STANCE (FEET TOGETHER); EYES OPEN, FIRM SURFACE Instruction: "Place your hands on your hips. Place your feet together until almost touching. Look straight ahead. Be as stable and still as  possible, until I say stop." Time in seconds:________ X(2) Normal: 30 s. (1) Moderate: < 30 s. (0) Severe: Unable. 8. STANCE (FEET TOGETHER); EYES CLOSED, FOAM SURFACE Instruction: "Step onto the foam. Place your hands on your hips. Place your feet together  until almost touching. Be as stable and still as possible, until I say stop. I will start timing when you close your eyes." Time in seconds:________ X(2) Normal: 30 s. (1) Moderate: < 30 s. (0) Severe: Unable. 9. INCLINE- EYES CLOSED Instruction: "Step onto the incline ramp. Please stand on the incline ramp with your toes toward the top. Place your feet shoulder width apart and have your arms down at your sides. I will start timing when you close your eyes." Time in seconds:________ X(2) Normal: Stands independently 30 s and aligns with gravity. (1) Moderate: Stands independently <30 s OR aligns with surface. (0) Severe: Unable. _________________________________________________________________________________________Dynamic Gait ______Subscore_____8___/10 10. CHANGE IN GAIT SPEED Instruction: "Begin walking at your normal speed, when I tell you 'fast', walk as fast as you can. When I say 'slow', walk very slowly." X(2) Normal: Significantly changes walking speed without imbalance. (1) Moderate: Unable to change walking speed or signs of imbalance. (0) Severe: Unable to achieve significant change in walking speed AND signs of imbalance. [place] - HORIZONTAL Instruction: "Begin walking at your normal speed, when I say "right", turn your head and look to the right. When I say "left" turn your head and look to the left. Try to keep yourself walking in a straight line." (2) Normal: performs head turns with no change in gait speed and good balance. X(1) Moderate: performs head turns with reduction in gait speed. (0) Severe: performs head turns with imbalance. 12. WALK WITH PIVOT TURNS Instruction: "Begin walking at your  normal speed. When I tell you to 'turn and stop', turn as quickly as you can, face the opposite direction, and stop. After the turn, your feet should be close together." X(2) Normal: Turns with feet close FAST (< 3 steps) with good balance. (1) Moderate: Turns with feet close SLOW (>4 steps) with good balance. (0) Severe: Cannot turn with feet close at any speed without imbalance. 13. STEP OVER OBSTACLES Instruction: "Begin walking at your normal speed. When you get to the box, step over it, not around it and keep walking." (2) Normal: Able to step over box with minimal change of gait speed and with good balance. X(1) Moderate: Steps over box but touches box OR displays cautious behavior by slowing gait. (0) Severe: Unable to step over box OR steps around box. 14. TIMED UP & GO WITH DUAL TASK [3 METER WALK] Instruction TUG: "When I say 'Go', stand up from chair, walk at your normal speed across the tape on the floor, turn around, and come back to sit in the chair." Instruction TUG with Dual Task: "Count backwards by threes starting at ___. When I say 'Go', stand up from chair, walk at your normal speed across the tape on the floor, turn around, and come back to sit in the chair. Continue counting backwards the entire time." TUG: ________seconds; Dual Task TUG: ________seconds X(2) Normal: No noticeable change in sitting, standing or walking while backward counting when compared to TUG without Dual Task. (1) Moderate: Dual Task affects either counting OR walking (>10%) when compared to the TUG without Dual Task. (0) Severe: Stops counting while walking OR stops walking while counting. When scoring item 14, if subject's gait speed slows more than 10% between the TUG without and with a Dual Task the score should be decreased by a point. TOTAL SCORE: ____23___/28           Objective measurements completed on examination: See above findings.  PT Long  Term Goals - 03/10/19 0954      PT LONG TERM GOAL #1   Title  Pt will independent with HEP to address Parkinson's deficits for improved balance and gait.  TARGET 04/11/2019    Time  4    Period  Weeks    Status  New    Target Date  04/11/19      PT LONG TERM GOAL #2   Title  Pt will improve 5x sit<>stand in less than 10 seconds for improved functional lower extremity strength.    Time  4    Period  Weeks    Status  New    Target Date  04/11/19      PT LONG TERM GOAL #3   Title  Pt will ambulate at least 450 ft in 2 minutes, for improved gait endurance and efficiency.    Time  4    Period  Weeks    Status  New    Target Date  04/11/19      PT LONG TERM GOAL #4   Title  Sensory Organization test to be performed, with goal to be written as appropriate.    Time  4    Period  Weeks    Status  New    Target Date  04/11/19      PT LONG TERM GOAL #5   Title  Pt will verbalize plans for optimal continued fitness upon d/c from PT.    Time  4    Period  Weeks    Status  New    Target Date  04/11/19             Plan - 03/10/19 0947    Clinical Impression Statement  Pt is a 63 year old gentleman with Parkinson's disease, referred to PT evaluation following PD screens 03/06/2019.  He is known to this clinic from previous bouts of therapy, and presents today with slowed mobility measures (5x sit<>stand, TUG and gait velocity) as compared to d/c measures January 2020.  He reports increased feeling of unsteadiness, "wobbliness" with standing.  He presents with decreased SLS and increased sway on compliant surfaces.  Sensory Organization test not able to be completed this visit, but will be done for further objective measures for balance.  Pt will benefit from skilled PT to address balance, functional mobility, posture for improved overall functional mobility.    Personal Factors and Comorbidities  Comorbidity 3+    Comorbidities  See PMH    Examination-Activity Limitations   Stand;Locomotion Level    Examination-Participation Restrictions  Yard Work    Stability/Clinical Decision Making  Stable/Uncomplicated    Clinical Decision Making  Low    Rehab Potential  Good    PT Frequency  2x / week    PT Duration  4 weeks   Plus eval   PT Treatment/Interventions  ADLs/Self Care Home Management;Gait training;Functional mobility training;Therapeutic activities;Therapeutic exercise;Balance training;Neuromuscular re-education;Patient/family education    PT Next Visit Plan  Perform Sensory Organization test and write goal as appropriate; initiate HEP ([employer]! Moves as apporpriate, corner balance exercises)    Consulted and Agree with Plan of Care  Patient       Patient will benefit from skilled therapeutic intervention in order to improve the following deficits and impairments:  Abnormal gait, Difficulty walking, Decreased balance, Decreased mobility, Decreased strength, Impaired flexibility, Postural dysfunction  Visit Diagnosis: Other abnormalities of gait and mobility  Unsteadiness on feet  Abnormal posture  Muscle weakness (  generalized)     Problem List Patient Active Problem List   Diagnosis Date Noted  . Aortic atherosclerosis ([place]) 06/10/2018  . Controlled type 2 diabetes mellitus with diabetic dermatitis, without long-term current use of insulin ([place]) 01/21/2018  . OSA treated with BiPAP 01/03/2017  . REM sleep behavior disorder 12/06/2016  . Uncontrolled secondary diabetes mellitus with stage 3 CKD (GFR 30-59) (HCC) 10/23/2016  . Medication management 10/23/2016  . Vitamin D deficiency 10/23/2016  . Parkinson disease ([place]) 01/04/2016  . Seasonal and perennial allergic rhinitis 06/21/2014  . Asthma, mild intermittent, well-controlled 06/21/2014  . Pseudomonas aeruginosa colonization 10/08/2012  . CAD (coronary artery disease) 11/15/2011  . Hypertension 11/15/2011  . Hyperlipidemia 11/15/2011  . Morbid obesity with BMI of 40.0-44.9, adult ([place])  11/15/2011    Todd Rice W. 03/10/2019, 10:00 AM  Frazier Butt., PT   [facility] [address] [place], [place], [address] Phone: [phone]   Fax:  [phone]  Name: DREYDAN SOTTO MRN: [id number] Date of Birth: 08/13/1955

## 2019-03-10 NOTE — Progress Notes (Signed)
The patient was set up for physical and occupational therapy for his Parkinson's disease.

## 2019-03-17 ENCOUNTER — Other Ambulatory Visit: Payer: Self-pay

## 2019-03-17 ENCOUNTER — Encounter: Payer: Self-pay | Admitting: Physical Therapy

## 2019-03-17 ENCOUNTER — Ambulatory Visit: Payer: 59 | Admitting: Physical Therapy

## 2019-03-17 DIAGNOSIS — R2681 Unsteadiness on feet: Secondary | ICD-10-CM

## 2019-03-17 DIAGNOSIS — R2689 Other abnormalities of gait and mobility: Secondary | ICD-10-CM

## 2019-03-17 NOTE — Therapy (Signed)
[facility] [address] [place], [place], [address] Phone: [phone]   Fax:  [phone]  Physical Therapy Treatment  Patient Details  Name: Todd Rice MRN: [id number] Date of Birth: [birth year] Referring Provider (PT): Lenor Coffin   Encounter Date: 03/17/2019  PT End of Session - 03/17/19 0812    Visit Number  2    Number of Visits  8    Authorization Type  [employer]    PT Start Time  0718    PT Stop Time  0758    PT Time Calculation (min)  40 min    Activity Tolerance  Patient tolerated treatment well    Behavior During Therapy  [facility] for tasks assessed/performed       Past Medical History:  Diagnosis Date  . Adult BMI 50.0-59.9 kg/sq m    52.77  . Anal fissure   . Anxiety   . Diabetes mellitus without complication (HCC)    borderline  . GERD (gastroesophageal reflux disease)    30 years ago, maybe from peptic ulcer  . H/O: gout    STABLE  . Hepatic steatosis 10/01/11  . History of kidney stones   . Hyperlipidemia   . Hypertension   . Kidney tumor    right upper kidney tumor, monitoring  . OSA on CPAP    [facility]  . Parkinson disease ([place]) 01/04/2016  . REM sleep behavior disorder 12/06/2016  . Right ureteral stone   . Ureteral calculus, right 06/12/2011  . Vitamin D deficiency   . Weakness     Past Surgical History:  Procedure Laterality Date  . ANAL FISSURE REPAIR  10-12-2000  . COLONOSCOPY N/A 07/01/2012   Procedure: COLONOSCOPY;  Surgeon: Lafayette Dragon, MD;  Location: [facility];  Service: Endoscopy;  Laterality: N/A;  . CYSTOSCOPY WITH RETROGRADE PYELOGRAM, URETEROSCOPY AND STENT PLACEMENT Bilateral 02/26/2013   Procedure: CYSTOSCOPY WITH RETROGRADE PYELOGRAM, URETEROSCOPY AND STENT PLACEMENT;  Surgeon: Alexis Frock, MD;  Location: [facility];  Service: Urology;  Laterality: Bilateral;  . HOLMIUM LASER APPLICATION Bilateral [id number]   Procedure: HOLMIUM LASER APPLICATION;   Surgeon: Alexis Frock, MD;  Location: [facility];  Service: Urology;  Laterality: Bilateral;  . kidney stone removal     06/2011-stent placement  . LEFT MEDIAL FEMORAL CONDYLE DEBRIDEMENT & DRILLING/ REMOVAL LOOSE BODY  09-15-2003  . NASAL SEPTUM SURGERY  1985  . STONE EXTRACTION WITH BASKET  06/12/2011   Procedure: STONE EXTRACTION WITH BASKET;  Surgeon: Claybon Jabs, MD;  Location: [facility];  Service: Urology;  Laterality: Right;    There were no vitals filed for this visit.  Subjective Assessment - 03/17/19 0720    Subjective  No pain, no changes since last visit.    Patient Stated Goals  Pt's goal for therapy is to not fall.    Currently in Pain?  No/denies                       [facility] Adult PT Treatment/Exercise - 03/17/19 0001      Self-Care   Self-Care  Other Self-Care Comments    Other Self-Care Comments   Discussed results of Sensory Organization test, including how to address with specific balance exercises.        Neuro Re-education Sensory Organization Test: Condition 1:  WNL Condition 2:  WNL Condition 3:  WNL Condition 4:  Decreased trial 1, WNL trial 2-3 Condition 5:  Decreased trial 1,  3; WNL 2 Condition 6:  WNL  Composite Balance score:  74/100 WNL) Sensory Analysis:  Somatosensory, Visual, Vestibular:  All WNL Pt tends to have preference for hip strategy   Balance Exercises - 03/17/19 0749      Balance Exercises: Standing   Wall Bumps  Hip    Wall Bumps-Hips  Eyes opened;Anterior/posterior;10 reps    Stepping Strategy  Anterior;Posterior;Lateral;10 reps;Foam/compliant surface   Cues on foam for increased step height, foot clearance   Heel Raises Limitations  2 sets x 10 reps    Toe Raise Limitations  2 sets x 10 reps        PT Education - 03/17/19 0811    Education Details  Results of Sensory Organization Test    Person(s) Educated  Patient    Methods  Explanation;Handout    Comprehension  Verbalized understanding           PT Long Term Goals - 03/10/19 0954      PT LONG TERM GOAL #1   Title  Pt will independent with HEP to address Parkinson's deficits for improved balance and gait.  TARGET 04/11/2019    Time  4    Period  Weeks    Status  New    Target Date  04/11/19      PT LONG TERM GOAL #2   Title  Pt will improve 5x sit<>stand in less than 10 seconds for improved functional lower extremity strength.    Time  4    Period  Weeks    Status  New    Target Date  04/11/19      PT LONG TERM GOAL #3   Title  Pt will ambulate at least 450 ft in 2 minutes, for improved gait endurance and efficiency.    Time  4    Period  Weeks    Status  New    Target Date  04/11/19      PT LONG TERM GOAL #4   Title  Sensory Organization test to be performed, with goal to be written as appropriate.    Time  4    Period  Weeks    Status  New    Target Date  04/11/19      PT LONG TERM GOAL #5   Title  Pt will verbalize plans for optimal continued fitness upon d/c from PT.    Time  4    Period  Weeks    Status  New    Target Date  04/11/19            Plan - 03/17/19 0813    Clinical Impression Statement  Sensory Organization test performed today, with pt scoring WNL for composite balance and sensory system use for balance.  He does exhibit increased sway with conditions where floor is moving, as well as preference for hip strategy at times.  Began to address by working on hip, ankle, step strategy, including on compliant surfaces.  Pt will continue to benefit from skilled PT to address balance and mobility.    Personal Factors and Comorbidities  Comorbidity 3+    Comorbidities  See PMH    Examination-Activity Limitations  Stand;Locomotion Level    Examination-Participation Restrictions  Yard Work    Stability/Clinical Decision Making  Stable/Uncomplicated    Rehab Potential  Good    PT Frequency  2x / week    PT Duration  4 weeks   Plus eval   PT Treatment/Interventions  ADLs/Self Care Home  Management;Gait training;Functional mobility training;Therapeutic activities;Therapeutic exercise;Balance training;Neuromuscular re-education;Patient/family education    PT Next Visit Plan  Perform Sensory Organization test and write goal as appropriate; initiate HEP (PWR! Moves as apporpriate, corner balance exercises)    Consulted and Agree with Plan of Care  Patient       Patient will benefit from skilled therapeutic intervention in order to improve the following deficits and impairments:  Abnormal gait, Difficulty walking, Decreased balance, Decreased mobility, Decreased strength, Impaired flexibility, Postural dysfunction  Visit Diagnosis: Unsteadiness on feet  Other abnormalities of gait and mobility     Problem List Patient Active Problem List   Diagnosis Date Noted  . Aortic atherosclerosis ([place]) 06/10/2018  . Controlled type 2 diabetes mellitus with diabetic dermatitis, without long-term current use of insulin ([place]) 01/21/2018  . OSA treated with BiPAP 01/03/2017  . REM sleep behavior disorder 12/06/2016  . Uncontrolled secondary diabetes mellitus with stage 3 CKD (GFR 30-59) (HCC) 10/23/2016  . Medication management 10/23/2016  . Vitamin D deficiency 10/23/2016  . Parkinson disease ([place]) 01/04/2016  . Seasonal and perennial allergic rhinitis 06/21/2014  . Asthma, mild intermittent, well-controlled 06/21/2014  . Pseudomonas aeruginosa colonization 10/08/2012  . CAD (coronary artery disease) 11/15/2011  . Hypertension 11/15/2011  . Hyperlipidemia 11/15/2011  . Morbid obesity with BMI of 40.0-44.9, adult ([place]) 11/15/2011    Dandra Shambaugh W. 03/17/2019, 8:16 AM  Mady Haagensen, PT 03/17/19 9:42 AM Phone: [phone] Fax: [facility] [address] [place], [place], [address] Phone: [phone]   Fax:  [phone]  Name: Todd Rice MRN: [id number] Date of Birth: March 30, 1956

## 2019-03-18 ENCOUNTER — Ambulatory Visit: Payer: 59 | Admitting: Neurology

## 2019-03-18 ENCOUNTER — Encounter: Payer: Self-pay | Admitting: Neurology

## 2019-03-18 VITALS — BP 136/82 | HR 67 | Temp 97.6°F | Ht 68.0 in | Wt 281.3 lb

## 2019-03-18 DIAGNOSIS — G20 Parkinson's disease: Secondary | ICD-10-CM

## 2019-03-18 MED ORDER — PRAMIPEXOLE DIHYDROCHLORIDE 1 MG PO TABS: 1.0000 mg | ORAL_TABLET | Freq: Three times a day (TID) | ORAL | 3 refills | Status: DC

## 2019-03-18 MED FILL — CARBIDOPA-LEVODOPA 25-100 T: 25-100 | 30 days supply | Qty: 180 | Fill #5

## 2019-03-18 MED FILL — PRAMIPEXOLE 1 MG TABLET: 1 | 30 days supply | Qty: 90 | Fill #0

## 2019-03-18 NOTE — Progress Notes (Signed)
Reason for visit: Parkinson's disease  JANA MASTIN is an 63 y.o. male  History of present illness:  Mr. Kaseman is a 63 year old right-handed white male with a history of Parkinson's disease.  The patient has been on Sinemet taking the 25/100 mg tablets, taking 2 tablets 3 times daily.  He is on Mirapex taking 1 mg 3 times daily.  The patient is tolerating the drugs well, he denies any significant problems with drowsiness or confusion.  He is undergoing some physical therapy working on balance.  He tries to walk on a regular basis.  He remains relatively overweight however.  He has reported some occasional worsening problems with depression, he is on Wellbutrin taking 150 mg daily.  He usually is able to work himself out of the depression and it is not a major issue for him.  He denies any hallucinations.  The patient has not had any falls, he denies issues with swallowing.  If he delays a dose by several hours, he will feel the wearing off effects but otherwise he has a smooth response to the medication throughout the day.  Past Medical History:  Diagnosis Date  . Adult BMI 50.0-59.9 kg/sq m    52.77  . Anal fissure   . Anxiety   . Diabetes mellitus without complication (HCC)    borderline  . GERD (gastroesophageal reflux disease)    30 years ago, maybe from peptic ulcer  . H/O: gout    STABLE  . Hepatic steatosis 10/01/11  . History of kidney stones   . Hyperlipidemia   . Hypertension   . Kidney tumor    right upper kidney tumor, monitoring  . OSA on CPAP    [facility]  . Parkinson disease ([place]) 01/04/2016  . REM sleep behavior disorder 12/06/2016  . Right ureteral stone   . Ureteral calculus, right 06/12/2011  . Vitamin D deficiency   . Weakness     Past Surgical History:  Procedure Laterality Date  . ANAL FISSURE REPAIR  10-12-2000  . COLONOSCOPY N/A 07/01/2012   Procedure: COLONOSCOPY;  Surgeon: Lafayette Dragon, MD;  [facility];  Service: Endoscopy;   Laterality: N/A;  . CYSTOSCOPY WITH RETROGRADE PYELOGRAM, URETEROSCOPY AND STENT PLACEMENT Bilateral 02/26/2013   Procedure: CYSTOSCOPY WITH RETROGRADE PYELOGRAM, URETEROSCOPY AND STENT PLACEMENT;  Surgeon: Alexis Frock, MD;  [facility];  Service: Urology;  Laterality: Bilateral;  . HOLMIUM LASER APPLICATION Bilateral [id number]   Procedure: HOLMIUM LASER APPLICATION;  Surgeon: Alexis Frock, MD;  [facility];  Service: Urology;  Laterality: Bilateral;  . kidney stone removal     06/2011-stent placement  . LEFT MEDIAL FEMORAL CONDYLE DEBRIDEMENT & DRILLING/ REMOVAL LOOSE BODY  09-15-2003  . NASAL SEPTUM SURGERY  1985  . STONE EXTRACTION WITH BASKET  06/12/2011   Procedure: STONE EXTRACTION WITH BASKET;  Surgeon: Claybon Jabs, MD;  Location: [facility];  Service: Urology;  Laterality: Right;    Family History  Problem Relation Age of Onset  . Heart disease Mother        Atrial fibrillation  . Hypertension Mother   . Hyperlipidemia Mother   . Lung cancer Father        was a smoker  . Cancer Father        lung  . Hyperlipidemia Brother   . Heart disease Maternal Aunt   . Hyperlipidemia Maternal Aunt   . Hypertension Maternal Aunt   . Stroke Maternal Aunt   .  Heart disease Paternal Grandmother   . Hyperlipidemia Paternal Grandmother     Social history:  reports that he has never smoked. He has never used smokeless tobacco. He reports that he does not drink alcohol or use drugs.    Allergies  Allergen Reactions  . Ciprofloxacin Hives  . Ceftriaxone Hives    Medications:  Prior to Admission medications   Medication Sig Start Date End Date Taking? Authorizing Provider  allopurinol (ZYLOPRIM) 300 MG tablet TAKE 1 TABLET BY MOUTH ONCE DAILY 05/30/18  Yes Unk Pinto, MD  ALPRAZolam Duanne Moron) 0.5 MG tablet Take 1 tablet (0.5 mg total) by mouth 3 (three) times daily. 09/26/18  Yes Vicie Mutters, PA-C  aspirin EC 81 MG tablet Take 81 mg by mouth at  bedtime.   Yes [provider]  buPROPion (WELLBUTRIN XL) 150 MG 24 hr tablet Take 1 tablet (150 mg total) by mouth every morning. 04/25/18 04/25/19 Yes Vicie Mutters, PA-C  carbidopa-levodopa (SINEMET IR) 25-100 MG tablet Take 2 tablets by mouth 3 (three) times daily. 10/16/18  Yes Suzzanne Cloud, NP  glipiZIDE (GLUCOTROL) 5 MG tablet Take 1 tablet (5 mg total) by mouth 2 (two) times daily before a meal. 10/07/18 10/07/19 Yes Vicie Mutters, PA-C  metFORMIN (GLUCOPHAGE-XR) 500 MG 24 hr tablet TAKE 2 TABLETS BY MOUTH 2 TIMES A DAY FOR DIABETES 05/30/18  Yes Unk Pinto, MD  pramipexole (MIRAPEX) 1 MG tablet Take 1 tablet (1 mg total) by mouth 3 (three) times daily. 10/16/18  Yes Suzzanne Cloud, NP  rosuvastatin (CRESTOR) 40 MG tablet Take 1 tablet (40 mg total) by mouth daily. 01/21/18  Yes Vicie Mutters, PA-C  sitaGLIPtin (JANUVIA) 100 MG tablet TAKE 1 TABLET BY MOUTH ONCE DAILY FOR DIABETES 10/31/18  Yes Vicie Mutters, PA-C  valsartan-hydrochlorothiazide (DIOVAN-HCT) 320-25 MG tablet Take 1 tablet Daily for BP 03/03/19  Yes Unk Pinto, MD  VENTOLIN HFA 108 (90 Base) MCG/ACT inhaler INHALE 2 PUFFS INTO THE LUNGS EVERY 6 HOURS AS NEEDED FOR WHEEZING OR SHORTNESS OF BREATH. 05/06/18  Yes Unk Pinto, MD  vitamin C (ASCORBIC ACID) 500 MG tablet Take 1 tablet (500 mg total) by mouth 3 (three) times daily. 01/16/17  Yes [name], NP  Vitamin D, Ergocalciferol, (DRISDOL) 50000 units CAPS capsule Take 50,000 Units by mouth every 7 (seven) days.   Yes [provider]    ROS:  Out of a complete 14 system review of symptoms, the patient complains only of the following symptoms, and all other reviewed systems are negative.  Depression Tremor Balance problems  Blood pressure 136/82, pulse 67, temperature 97.6 F (36.4 C), temperature source Temporal, height 5\' 8"  (1.727 m), weight 281 lb 5 oz (127.6 kg).  Physical Exam  General: The patient is alert and  cooperative at the time of the examination.  The patient is markedly obese.  Skin: No significant peripheral edema is noted.   Neurologic Exam  Mental status: The patient is alert and oriented x 3 at the time of the examination. The patient has apparent normal recent and remote memory, with an apparently normal attention span and concentration ability.   Cranial nerves: Facial symmetry is present. Speech is normal, no aphasia or dysarthria is noted. Extraocular movements are full. Visual fields are full.  Masking of the face is seen.  Motor: The patient has good strength in all 4 extremities.  Sensory examination: Soft touch sensation is symmetric on the face, arms, and legs.  Coordination: The patient has good finger-nose-finger and  heel-to-shin bilaterally.  Mild resting tremor is noted on the right greater than left upper extremity.  Gait and station: The patient has the ability to arise from a seated position with arms crossed.  Once up, he is able to ambulate without assistance, he takes good stride and has good turns.  Some decrease in arm swing is seen.  Tandem gait is normal. Romberg is negative. No drift is seen.  Reflexes: Deep tendon reflexes are symmetric.   Assessment/Plan:  1.  Parkinson's disease  The patient will occasionally note some tremor when holding objects with the left hand.  He is tolerating his current dose of medications, he has not had a lot of progression in his symptoms since last seen, he will continue on the Mirapex and Sinemet at the current dose.  He will follow-up here in 5 to 6 months.  Jill Alexanders MD 03/18/2019 7:55 AM  [facility] [address] [place], [place] [address]  Phone [phone] Fax [phone]

## 2019-03-21 ENCOUNTER — Ambulatory Visit: Payer: 59 | Admitting: Physical Therapy

## 2019-03-21 ENCOUNTER — Encounter: Payer: Self-pay | Admitting: Physical Therapy

## 2019-03-21 ENCOUNTER — Other Ambulatory Visit: Payer: Self-pay

## 2019-03-21 DIAGNOSIS — R2689 Other abnormalities of gait and mobility: Secondary | ICD-10-CM | POA: Diagnosis not present

## 2019-03-21 DIAGNOSIS — M6281 Muscle weakness (generalized): Secondary | ICD-10-CM

## 2019-03-21 DIAGNOSIS — R2681 Unsteadiness on feet: Secondary | ICD-10-CM

## 2019-03-21 NOTE — Patient Instructions (Addendum)
Access Code: [email or web address]  URL: [email or web address]  Date: 03/21/2019  Prepared by: Mady Haagensen   Exercises Seated Ankle Dorsiflexion with Resistance - 10 reps - 2 sets - 3 sec hold - 1x daily - 5x weekly Seated Ankle Eversion with Resistance - 10 reps - 2 sets - 3 sec hold - 1x daily - 5x weekly Ankle eversion towel slide - 10 reps - 2 sets - 1x daily - 5x weekly Standing 1/2 Tandem Stance - head turns/head nods EO and EC,5 reps each - 1 sets - 1x daily - 5x weekly

## 2019-03-21 NOTE — Therapy (Signed)
[facility] [address] [place], [place], [address] Phone: [phone]   Fax:  [phone]  Physical Therapy Treatment  Patient Details  Name: Todd [place] MRN: [id number] Date of Birth: 07-Jan-1956 Referring Provider (PT): Lenor Coffin   Encounter Date: 03/21/2019  PT End of Session - 03/21/19 1423    Visit Number  3    Number of Visits  8    Authorization Type  UHC    PT Start Time  0713    PT Stop Time  0802    PT Time Calculation (min)  49 min    Activity Tolerance  Patient tolerated treatment well    Behavior During Therapy  [facility] for tasks assessed/performed       Past Medical History:  Diagnosis Date  . Adult BMI 50.0-59.9 kg/sq m    52.77  . Anal fissure   . Anxiety   . Diabetes mellitus without complication (HCC)    borderline  . GERD (gastroesophageal reflux disease)    30 years ago, maybe from peptic ulcer  . H/O: gout    STABLE  . Hepatic steatosis 10/01/11  . History of kidney stones   . Hyperlipidemia   . Hypertension   . Kidney tumor    right upper kidney tumor, monitoring  . OSA on CPAP    [facility]  . Parkinson disease ([place]) 01/04/2016  . REM sleep behavior disorder 12/06/2016  . Right ureteral stone   . Ureteral calculus, right 06/12/2011  . Vitamin D deficiency   . Weakness     Past Surgical History:  Procedure Laterality Date  . ANAL FISSURE REPAIR  10-12-2000  . COLONOSCOPY N/A 07/01/2012   Procedure: COLONOSCOPY;  Surgeon: Lafayette Dragon, MD;  [facility]: [facility];  Service: Endoscopy;  Laterality: N/A;  . CYSTOSCOPY WITH RETROGRADE PYELOGRAM, URETEROSCOPY AND STENT PLACEMENT Bilateral 02/26/2013   Procedure: CYSTOSCOPY WITH RETROGRADE PYELOGRAM, URETEROSCOPY AND STENT PLACEMENT;  Surgeon: Alexis Frock, MD;  Location: [facility];  Service: Urology;  Laterality: Bilateral;  . HOLMIUM LASER APPLICATION Bilateral [id number]   Procedure: HOLMIUM LASER APPLICATION;   Surgeon: Alexis Frock, MD;  Location: [facility];  Service: Urology;  Laterality: Bilateral;  . kidney stone removal     06/2011-stent placement  . LEFT MEDIAL FEMORAL CONDYLE DEBRIDEMENT & DRILLING/ REMOVAL LOOSE BODY  09-15-2003  . NASAL SEPTUM SURGERY  1985  . STONE EXTRACTION WITH BASKET  06/12/2011   Procedure: STONE EXTRACTION WITH BASKET;  Surgeon: Claybon Jabs, MD;  Location: [facility];  Service: Urology;  Laterality: Right;    There were no vitals filed for this visit.  Subjective Assessment - 03/21/19 0714    Subjective  Didn't sleep well at all last night, the change in weather just makes me stiff.    Patient Stated Goals  Pt's goal for therapy is to not fall.    Currently in Pain?  Yes   soreness, but not rated                      [facility] Adult PT Treatment/Exercise - 03/21/19 0001      Transfers   Transfers  Sit to Stand;Stand to Sit    Sit to Stand  6: Modified independent (Device/Increase time);Without upper extremity assist;From chair/3-in-1    Stand to Sit  6: Modified independent (Device/Increase time);Without upper extremity assist;To chair/3-in-1    Number of Reps  Other reps (comment);Other sets (comment)  4 sets of 5 reps   Comments  Each rep of sit<>stand followed by PWR! Moves:  PWR! Up x 5 , PWR! Rock x 5, PWR! Twist x 5, PWR! Step x 5      Exercises   Exercises  Knee/Hip;Ankle      Knee/Hip Exercises: Aerobic   Stepper  Seated Scifit, Level 1.5, 4 extremities, x 6 minutes for flexibility, aerobic strengthening      Knee/Hip Exercises: Seated   Ball Squeeze  2 sets x 10 reps, pillow squeeze for hip adduction      Ankle Exercises: Seated   Towel Inversion/Eversion  3 reps   RLE   Other Seated Ankle Exercises  REsisted ankle dorsiflexion green theraband x 10 reps, resisted ankle eversion green band x 10 reps          Balance Exercises - 03/21/19 0728      Balance Exercises: Standing   Standing Eyes Opened  Wide  (BOA);Head turns;Foam/compliant surface;Narrow base of support (BOS);5 reps   Head nods   Standing Eyes Closed  Wide (BOA);Narrow base of support (BOS);Foam/compliant surface;Head turns;5 reps   Head nods   Wall Bumps  Hip    Wall Bumps-Hips  Eyes opened;Anterior/posterior;10 reps   2 sets   Tandem Gait  Forward;Retro;Intermittent upper extremity support;3 reps   Along counter; also marching tandem fwd/back   Partial Tandem Stance  Eyes open;Eyes closed;Foam/compliant surface;5 reps   Head nods   Sidestepping  Foam/compliant support;3 reps   3 reps solid surface, 3 reps foam beam   Marching Limitations  Marching forward and back along counter, 3 sets     Heel Raises Limitations  2 sets x 10 reps    Toe Raise Limitations  2 sets x 10 reps    Other Standing Exercises  Pt noted on compliant surface and with tandem gait to have increased R foot inversion, almost rolling in at times; began addressing with ankle strengthening exercises.        PT Education - 03/21/19 1422    Education Details  HEP initiated-see instructions    Person(s) Educated  Patient    Methods  Explanation;Demonstration;Handout    Comprehension  Verbalized understanding;Returned demonstration          PT Long Term Goals - 03/21/19 1428      PT LONG TERM GOAL #1   Title  Pt will independent with HEP to address Parkinson's deficits for improved balance and gait.  TARGET 04/11/2019    Time  4    Period  Weeks    Status  New      PT LONG TERM GOAL #2   Title  Pt will improve 5x sit<>stand in less than 10 seconds for improved functional lower extremity strength.    Time  4    Period  Weeks    Status  New      PT LONG TERM GOAL #3   Title  Pt will ambulate at least 450 ft in 2 minutes, for improved gait endurance and efficiency.    Time  4    Period  Weeks    Status  New      PT LONG TERM GOAL #4   Title  Sensory Organization test to be performed, with goal to be written as appropriate.    Baseline  SOT  WNL, no goal needed    Time  4    Period  Weeks    Status  Deferred  PT LONG TERM GOAL #5   Title  Pt will verbalize plans for optimal continued fitness upon d/c from PT.    Time  4    Period  Weeks    Status  New            Plan - 03/21/19 1423    Clinical Impression Statement  Initiated HEP to address balance as well as ankle strengthening.  With compliant surface balance exercises, pt demonstrates slight unsteadiness in partial tandem position.  With dynamic balance exercises on compliant surface, pt noted to have increased R foot inversion with foot near rolling.  Addressed this with ankle strengthening exercises.  Pt will continue to beneift from skilled PT to address balance, ankle strenght and functional mobility.    Personal Factors and Comorbidities  Comorbidity 3+    Comorbidities  See PMH    Examination-Activity Limitations  Stand;Locomotion Level    Examination-Participation Restrictions  Yard Work    Stability/Clinical Decision Making  Stable/Uncomplicated    Rehab Potential  Good    PT Frequency  2x / week    PT Duration  4 weeks   Plus eval   PT Treatment/Interventions  ADLs/Self Care Home Management;Gait training;Functional mobility training;Therapeutic activities;Therapeutic exercise;Balance training;Neuromuscular re-education;Patient/family education    PT Next Visit Plan  Review balance exercises, ankle strengthening; progress dynamic balance and compliant surfaces as able (pay attention to RLE inversion)    Consulted and Agree with Plan of Care  Patient       Patient will benefit from skilled therapeutic intervention in order to improve the following deficits and impairments:  Abnormal gait, Difficulty walking, Decreased balance, Decreased mobility, Decreased strength, Impaired flexibility, Postural dysfunction  Visit Diagnosis: Unsteadiness on feet  Muscle weakness (generalized)     Problem List Patient Active Problem List   Diagnosis Date Noted   . Aortic atherosclerosis ([place]) 06/10/2018  . Controlled type 2 diabetes mellitus with diabetic dermatitis, without long-term current use of insulin ([place]) 01/21/2018  . OSA treated with BiPAP 01/03/2017  . REM sleep behavior disorder 12/06/2016  . Uncontrolled secondary diabetes mellitus with stage 3 CKD (GFR 30-59) (HCC) 10/23/2016  . Medication management 10/23/2016  . Vitamin D deficiency 10/23/2016  . Parkinson disease ([place]) 01/04/2016  . Seasonal and perennial allergic rhinitis 06/21/2014  . Asthma, mild intermittent, well-controlled 06/21/2014  . Pseudomonas aeruginosa colonization 10/08/2012  . CAD (coronary artery disease) 11/15/2011  . Hypertension 11/15/2011  . Hyperlipidemia 11/15/2011  . Morbid obesity with BMI of 40.0-44.9, adult ([place]) 11/15/2011    Tiyonna Sardinha W. 03/21/2019, 2:30 PM  Frazier Butt., PT  [facility] [address] [place], [place], [address] Phone: [phone]   Fax:  [phone]  Name: ALLANTE MOUSEL MRN: [id number] Date of Birth: [birth year]

## 2019-03-24 ENCOUNTER — Other Ambulatory Visit: Payer: Self-pay

## 2019-03-24 ENCOUNTER — Ambulatory Visit: Payer: 59 | Admitting: Physical Therapy

## 2019-03-24 ENCOUNTER — Encounter: Payer: Self-pay | Admitting: Physical Therapy

## 2019-03-24 DIAGNOSIS — R2689 Other abnormalities of gait and mobility: Secondary | ICD-10-CM

## 2019-03-24 DIAGNOSIS — R2681 Unsteadiness on feet: Secondary | ICD-10-CM

## 2019-03-24 DIAGNOSIS — M6281 Muscle weakness (generalized): Secondary | ICD-10-CM

## 2019-03-24 NOTE — Therapy (Signed)
[facility] [address] [place], [place], [address] Phone: [phone]   Fax:  [phone]  Physical Therapy Treatment  Patient Details  Name: [place] MRN: [id number] Date of Birth: 25-Oct-1955 Referring Provider (PT): Lenor Coffin   Encounter Date: 03/24/2019  PT End of Session - 03/24/19 1217    Visit Number  4    Number of Visits  8    Authorization Type  UHC    PT Start Time  0718    PT Stop Time  0757    PT Time Calculation (min)  39 min    Activity Tolerance  Patient tolerated treatment well    Behavior During Therapy  [facility] for tasks assessed/performed       Past Medical History:  Diagnosis Date  . Adult BMI 50.0-59.9 kg/sq m    52.77  . Anal fissure   . Anxiety   . Diabetes mellitus without complication (HCC)    borderline  . GERD (gastroesophageal reflux disease)    30 years ago, maybe from peptic ulcer  . H/O: gout    STABLE  . Hepatic steatosis 10/01/11  . History of kidney stones   . Hyperlipidemia   . Hypertension   . Kidney tumor    right upper kidney tumor, monitoring  . OSA on CPAP    [facility]  . Parkinson disease ([place]) 01/04/2016  . REM sleep behavior disorder 12/06/2016  . Right ureteral stone   . Ureteral calculus, right 06/12/2011  . Vitamin D deficiency   . Weakness     Past Surgical History:  Procedure Laterality Date  . ANAL FISSURE REPAIR  10-12-2000  . COLONOSCOPY N/A 07/01/2012   Procedure: COLONOSCOPY;  Surgeon: Lafayette Dragon, MD;  [facility];  Service: Endoscopy;  Laterality: N/A;  . CYSTOSCOPY WITH RETROGRADE PYELOGRAM, URETEROSCOPY AND STENT PLACEMENT Bilateral 02/26/2013   Procedure: CYSTOSCOPY WITH RETROGRADE PYELOGRAM, URETEROSCOPY AND STENT PLACEMENT;  Surgeon: Alexis Frock, MD;  [facility];  Service: Urology;  Laterality: Bilateral;  . HOLMIUM LASER APPLICATION Bilateral [id number]   Procedure: HOLMIUM LASER APPLICATION;   Surgeon: Alexis Frock, MD;  [facility];  Service: Urology;  Laterality: Bilateral;  . kidney stone removal     06/2011-stent placement  . LEFT MEDIAL FEMORAL CONDYLE DEBRIDEMENT & DRILLING/ REMOVAL LOOSE BODY  09-15-2003  . NASAL SEPTUM SURGERY  1985  . STONE EXTRACTION WITH BASKET  06/12/2011   Procedure: STONE EXTRACTION WITH BASKET;  Surgeon: Claybon Jabs, MD;  [facility];  Service: Urology;  Laterality: Right;    There were no vitals filed for this visit.  Subjective Assessment - 03/24/19 0721    Subjective  Had another night where I didn't sleep well.  Worked hard in the yard over the weekend, but I didn't do the exercises.    Patient Stated Goals  Pt's goal for therapy is to not fall.    Currently in Pain?  No/denies                       [facility] Adult PT Treatment/Exercise - 03/24/19 0724      Ambulation/Gait   Ambulation/Gait  Yes    Ambulation/Gait Assistance  7: Independent    Ambulation Distance (Feet)  345 Feet   x 2   Assistive device  None    Gait Pattern  Step-through pattern;Decreased arm swing - right;Decreased arm swing - left;Decreased dorsiflexion - right;Decreased step  length - right;Decreased trunk rotation    Ambulation Surface  Level;Indoor    Gait Comments  Cues for deliberate R foot placementa nd heelstrike      Exercises   Exercises  Knee/Hip;Ankle      Knee/Hip Exercises: Stretches   Gastroc Stretch  Right;Left;3 reps;20 seconds    Gastroc Stretch Limitations  Runner's stretch position, cues to keep heel on ground    Other Knee/Hip Stretches  Standing on step:  gastroc stretch, 3 reps, BLEs x 15 seconds      Knee/Hip Exercises: Aerobic   Stepper  Seated Scifit, Level 1.8, 4 extremities, x 8 minutes for flexibility, aerobic strengthening.  Pt starts RPM a 80, and able to keep above 80, as high as 95-97 RPM; rates effort/intensity level as 4/10      Ankle Exercises: Seated   Towel Inversion/Eversion  1  rep   10 reps    Other Seated Ankle Exercises  REsisted ankle dorsiflexion green theraband x 10 reps, resisted ankle eversion green band x 10 reps    Other Seated Ankle Exercises  REviewed ankle exercises given last visit, with pt return demo understanding          Balance Exercises - 03/24/19 0736      Balance Exercises: Standing   Tandem Stance  Eyes open;Foam/compliant surface;5 reps   Head turns, head nods   Partial Tandem Stance  Eyes open;Eyes closed;Foam/compliant surface;5 reps   Head turns, head nods, 2 sets EC   Marching Limitations  Marching in place x 10 reps on compliant surface    Other Standing Exercises  Alternating step taps to 6", 12" step, 15 reps each; then step taps to 6" step>runner's stretch position each leg, x 15 reps, to focus on timing/coordination of RLE advancing with gait.  Step taps x 10 reps forward, on compliant surface             PT Long Term Goals - 03/21/19 1428      PT LONG TERM GOAL #1   Title  Pt will independent with HEP to address Parkinson's deficits for improved balance and gait.  TARGET 04/11/2019    Time  4    Period  Weeks    Status  New      PT LONG TERM GOAL #2   Title  Pt will improve 5x sit<>stand in less than 10 seconds for improved functional lower extremity strength.    Time  4    Period  Weeks    Status  New      PT LONG TERM GOAL #3   Title  Pt will ambulate at least 450 ft in 2 minutes, for improved gait endurance and efficiency.    Time  4    Period  Weeks    Status  New      PT LONG TERM GOAL #4   Title  Sensory Organization test to be performed, with goal to be written as appropriate.    Baseline  SOT WNL, no goal needed    Time  4    Period  Weeks    Status  Deferred      PT LONG TERM GOAL #5   Title  Pt will verbalize plans for optimal continued fitness upon d/c from PT.    Time  4    Period  Weeks    Status  New            Plan - 03/24/19 1217    Clinical  Impression Statement  Addressed  aerobic and functional strengthening this visit, as well as balance activities to work on timing and coordination of swing through/heelstrike with RLE with gait.    Personal Factors and Comorbidities  Comorbidity 3+    Comorbidities  See PMH    Examination-Activity Limitations  Stand;Locomotion Level    Examination-Participation Restrictions  Yard Work    Stability/Clinical Decision Making  Stable/Uncomplicated    Rehab Potential  Good    PT Frequency  2x / week    PT Duration  4 weeks   Plus eval   PT Treatment/Interventions  ADLs/Self Care Home Management;Gait training;Functional mobility training;Therapeutic activities;Therapeutic exercise;Balance training;Neuromuscular re-education;Patient/family education    PT Next Visit Plan  progress dynamic balance and compliant surfaces as able (pay attention to RLE inversion); ankle strengthening exercises    Consulted and Agree with Plan of Care  Patient       Patient will benefit from skilled therapeutic intervention in order to improve the following deficits and impairments:  Abnormal gait, Difficulty walking, Decreased balance, Decreased mobility, Decreased strength, Impaired flexibility, Postural dysfunction  Visit Diagnosis: Muscle weakness (generalized)  Unsteadiness on feet  Other abnormalities of gait and mobility     Problem List Patient Active Problem List   Diagnosis Date Noted  . Aortic atherosclerosis ([place]) 06/10/2018  . Controlled type 2 diabetes mellitus with diabetic dermatitis, without long-term current use of insulin ([place]) 01/21/2018  . OSA treated with BiPAP 01/03/2017  . REM sleep behavior disorder 12/06/2016  . Uncontrolled secondary diabetes mellitus with stage 3 CKD (GFR 30-59) (HCC) 10/23/2016  . Medication management 10/23/2016  . Vitamin D deficiency 10/23/2016  . Parkinson disease ([place]) 01/04/2016  . Seasonal and perennial allergic rhinitis 06/21/2014  . Asthma, mild intermittent, well-controlled  06/21/2014  . Pseudomonas aeruginosa colonization 10/08/2012  . CAD (coronary artery disease) 11/15/2011  . Hypertension 11/15/2011  . Hyperlipidemia 11/15/2011  . Morbid obesity with BMI of 40.0-44.9, adult ([place]) 11/15/2011    Darrow Barreiro W. 03/24/2019, 12:19 PM  Frazier Butt., PT  [facility] [address] [place], [place], [address] Phone: [phone]   Fax:  [phone]  Name: [place] MRN: [id number] Date of Birth: [birth year]

## 2019-03-26 ENCOUNTER — Encounter: Payer: Self-pay | Admitting: Occupational Therapy

## 2019-03-26 ENCOUNTER — Ambulatory Visit: Payer: 59 | Admitting: Occupational Therapy

## 2019-03-26 ENCOUNTER — Other Ambulatory Visit: Payer: Self-pay

## 2019-03-26 DIAGNOSIS — R2689 Other abnormalities of gait and mobility: Secondary | ICD-10-CM | POA: Diagnosis not present

## 2019-03-26 DIAGNOSIS — R29818 Other symptoms and signs involving the nervous system: Secondary | ICD-10-CM

## 2019-03-26 DIAGNOSIS — R2681 Unsteadiness on feet: Secondary | ICD-10-CM

## 2019-03-26 DIAGNOSIS — R29898 Other symptoms and signs involving the musculoskeletal system: Secondary | ICD-10-CM

## 2019-03-26 DIAGNOSIS — R293 Abnormal posture: Secondary | ICD-10-CM

## 2019-03-26 DIAGNOSIS — R278 Other lack of coordination: Secondary | ICD-10-CM

## 2019-03-26 DIAGNOSIS — M6281 Muscle weakness (generalized): Secondary | ICD-10-CM

## 2019-03-26 NOTE — Therapy (Signed)
[facility] [address] [place], [place], [address] Phone: [phone]   Fax:  [phone]  Occupational Therapy Evaluation  Patient Details  Name: Todd Rice MRN: [id number] Date of Birth: 28-Oct-1955 Referring Provider (OT): Dr. [name]   Encounter Date: 03/26/2019  OT End of Session - 03/26/19 0723    Visit Number  1    Number of Visits  11    Date for OT Re-Evaluation  05/02/19    Authorization Type  [employer]    Authorization Time Period  60 VL 6 used    Authorization - Visit Number  1    Authorization - Number of Visits  11    OT Start Time  0722    OT Stop Time  0758    OT Time Calculation (min)  36 min    Activity Tolerance  Patient tolerated treatment well       Past Medical History:  Diagnosis Date  . Adult BMI 50.0-59.9 kg/sq m    52.77  . Anal fissure   . Anxiety   . Diabetes mellitus without complication (HCC)    borderline  . GERD (gastroesophageal reflux disease)    30 years ago, maybe from peptic ulcer  . H/O: gout    STABLE  . Hepatic steatosis 10/01/11  . History of kidney stones   . Hyperlipidemia   . Hypertension   . Kidney tumor    right upper kidney tumor, monitoring  . OSA on CPAP    [facility]  . Parkinson disease ([place]) 01/04/2016  . REM sleep behavior disorder 12/06/2016  . Right ureteral stone   . Ureteral calculus, right 06/12/2011  . Vitamin D deficiency   . Weakness     Past Surgical History:  Procedure Laterality Date  . ANAL FISSURE REPAIR  10-12-2000  . COLONOSCOPY N/A 07/01/2012   Procedure: COLONOSCOPY;  Surgeon: Lafayette Dragon, MD;  Location: [facility];  Service: Endoscopy;  Laterality: N/A;  . CYSTOSCOPY WITH RETROGRADE PYELOGRAM, URETEROSCOPY AND STENT PLACEMENT Bilateral 02/26/2013   Procedure: CYSTOSCOPY WITH RETROGRADE PYELOGRAM, URETEROSCOPY AND STENT PLACEMENT;  Surgeon: Alexis Frock, MD;  Location: [facility];  Service: Urology;  Laterality: Bilateral;  .  HOLMIUM LASER APPLICATION Bilateral [id number]   Procedure: HOLMIUM LASER APPLICATION;  Surgeon: Alexis Frock, MD;  Location: [facility];  Service: Urology;  Laterality: Bilateral;  . kidney stone removal     06/2011-stent placement  . LEFT MEDIAL FEMORAL CONDYLE DEBRIDEMENT & DRILLING/ REMOVAL LOOSE BODY  09-15-2003  . NASAL SEPTUM SURGERY  1985  . STONE EXTRACTION WITH BASKET  06/12/2011   Procedure: STONE EXTRACTION WITH BASKET;  Surgeon: Claybon Jabs, MD;  Location[facility];  Service: Urology;  Laterality: Right;    There were no vitals filed for this visit.  Subjective Assessment - 03/26/19 0723    Patient Stated Goals  improve dexterity in bilateral UE's    Currently in Pain?  No/denies        [facility] OT Assessment - 03/26/19 0725      Assessment   Medical Diagnosis  Parkinson's disease    Referring Provider (OT)  Dr. [name]    Onset Date/Surgical Date  03/06/19    Hand Dominance  Right      Precautions   Precautions  Fall      Balance Screen   Has the patient fallen in the past 6 months  No    Has the patient had a decrease in  activity level because of a fear of falling?   No    Is the patient reluctant to leave their home because of a fear of falling?   No      Home  Environment   Family/patient expects to be discharged to:  Private residence    Lives With  Spouse      Prior Function   Level of [place]  Retired      ADL   Eating/Feeding  Modified independent    Grooming  Modified independent    Upper Body Bathing  Modified independent    Lower Body Bathing  Modified independent    Upper Body Dressing  Independent    Lower Body Dressing  Modified independent    Leisure centre manager  Modified independent      IADL   Light Housekeeping  Performs light daily tasks such as dishwashing, bed making    Medication Management  Is responsible for taking medication in correct dosages at  correct time      Mobility   Mobility Status  Independent      Written Expression   Dominant Hand  Right    Handwriting  Increased time;100% legible      Cognition   Overall Cognitive Status  Within Functional Limits for tasks assessed      Observation/Other Assessments   Other Surveys   Select    Physical Performance Test    Yes    Simulated Eating Time (seconds)  14.12 secs    Donning Doffing Jacket Time (seconds)  13.38 secs    Donning Doffing Jacket Comments  3 button/ unbutton: 44.88 secs      Posture/Postural Control   Posture/Postural Control  Postural limitations    Postural Limitations  Rounded Shoulders;Forward head      Sensation   Light Touch  Appears Intact      Coordination   Fine Motor Movements are Fluid and Coordinated  No    9 Hole Peg Test  Right;Left    Right 9 Hole Peg Test  32.90    Left 9 Hole Peg Test  29.50    Box and Blocks  RUE 41 blocks, LUE 47 blocks      Tone   Assessment Location  Right Upper Extremity;Left Upper Extremity      ROM / Strength   AROM / PROM / Strength  AROM      AROM   Overall AROM   Deficits    Overall AROM Comments  RUE shoulder flexion 140, LUE shoulder flexion 130, RUE elbow extension -5, LUE elbow extension -10, RUE supination/ pronation: 80/90, LUE 90/90      RUE Tone   RUE Tone  Mild      LUE Tone   LUE Tone  Mild      RLE Tone   RLE Tone  Mild                           OT Long Term Goals - 03/26/19 1503      OT LONG TERM GOAL #1   Title  I with PD specific HEP    Time  5    Period  Weeks    Status  New    Target Date  05/02/19      OT LONG TERM GOAL #2   Title  Pt will demonstrate improved RUE functional use  for ADLs as evidenced by increasing box and blocks score to 44 blocks or greater.    Baseline  RUE 41, LUE 47    Time  5    Period  Weeks    Status  New      OT LONG TERM GOAL #3   Title  Pt will demonstrate improved dexterity for daily activities including fastening  buttons as evidenced by decreasing 3 button/ unbutton time to 40 secs or less.    Time  5    Period  Weeks    Status  New      OT LONG TERM GOAL #4   Title  Pt will verbalize understanding of adapted strategies for increased ease and independence wtih ADLS/ IADLs(ie: picking up pills, small objects)    Time  5    Period  Weeks    Status  New      OT LONG TERM GOAL #5   Title  Pt will demonstrate improved ease with self feeding as evidenced by decreasing PPT#2 to 11 secs or less.    Time  5    Period  Weeks    Status  New            Plan - 03/26/19 1500    Clinical Impression Statement  Pt is a 63 y.o male with Parkinson's disease who returns to occupational therapy with a decline in fine motor coordination and UE functional use. Pt. presents with the following deficits: decreased coordination, decreased ROM,  rigidity, decreased balance, tremor, and abnormal posture which impedes perfromance of ADLs/ IADLS. Pt can benefit from skilled occupational therapy to address these deficits and to maximize safety and independence with daily activities. PMH: PD, DM, CAD, HTN, hyperlipidemia, kidney tumor. Pt is retired and lives with his wife. He enjoys travel    OT Occupational Profile and History  Problem Focused Assessment - Including review of records relating to presenting problem    Occupational performance deficits (Please refer to evaluation for details):  ADL's;IADL's;Play;Leisure    Body Structure / Function / Physical Skills  ADL;UE functional use;Balance;Flexibility;FMC;ROM;GMC;Coordination;Decreased knowledge of precautions;IADL;Decreased knowledge of use of DME;Dexterity;Strength;Mobility;Tone;Edema    Rehab Potential  Good    Clinical Decision Making  Limited treatment options, no task modification necessary    Comorbidities Affecting Occupational Performance:  May have comorbidities impacting occupational performance    Modification or Assistance to Complete Evaluation   No  modification of tasks or assist necessary to complete eval    OT Frequency  2x / week    OT Duration  --   5 weeks or 10 visits plus eval over 8 weeks   OT Treatment/Interventions  Self-care/ADL training;Ultrasound;Aquatic Therapy;DME and/or AE instruction;Patient/family education;Balance training;Passive range of motion;Paraffin;Gait Training;Fluidtherapy;Cryotherapy;Moist Heat;Therapeutic exercise;Manual Therapy;Therapeutic activities;Cognitive remediation/compensation;Neuromuscular education    Plan  initiate HEP    Consulted and Agree with Plan of Care  Patient       Patient will benefit from skilled therapeutic intervention in order to improve the following deficits and impairments:   Body Structure / Function / Physical Skills: ADL, UE functional use, Balance, Flexibility, FMC, ROM, GMC, Coordination, Decreased knowledge of precautions, IADL, Decreased knowledge of use of DME, Dexterity, Strength, Mobility, Tone, Edema       Visit Diagnosis: Muscle weakness (generalized) - Plan: Ot plan of care cert/re-cert  Other lack of coordination - Plan: Ot plan of care cert/re-cert  Abnormal posture - Plan: Ot plan of care cert/re-cert  Other symptoms and signs involving the nervous  system - Plan: Ot plan of care cert/re-cert  Other abnormalities of gait and mobility - Plan: Ot plan of care cert/re-cert  Unsteadiness on feet - Plan: Ot plan of care cert/re-cert  Other symptoms and signs involving the musculoskeletal system - Plan: Ot plan of care cert/re-cert    Problem List Patient Active Problem List   Diagnosis Date Noted  . Aortic atherosclerosis ([place]) 06/10/2018  . Controlled type 2 diabetes mellitus with diabetic dermatitis, without long-term current use of insulin ([place]) 01/21/2018  . OSA treated with BiPAP 01/03/2017  . REM sleep behavior disorder 12/06/2016  . Uncontrolled secondary diabetes mellitus with stage 3 CKD (GFR 30-59) (HCC) 10/23/2016  . Medication management  10/23/2016  . Vitamin D deficiency 10/23/2016  . Parkinson disease ([place]) 01/04/2016  . Seasonal and perennial allergic rhinitis 06/21/2014  . Asthma, mild intermittent, well-controlled 06/21/2014  . Pseudomonas aeruginosa colonization 10/08/2012  . CAD (coronary artery disease) 11/15/2011  . Hypertension 11/15/2011  . Hyperlipidemia 11/15/2011  . Morbid obesity with BMI of 40.0-44.9, adult ([place]) 11/15/2011    RINE,KATHRYN 03/26/2019, 3:13 PM  [facility] [address] [place], [place], [address] Phone: [phone]   Fax:  [phone]  Name: HUBBARD CROSTON MRN: [id number] Date of Birth: Feb 10, 1956

## 2019-03-27 ENCOUNTER — Ambulatory Visit: Payer: 59 | Admitting: Physical Therapy

## 2019-03-27 ENCOUNTER — Ambulatory Visit: Payer: 59 | Admitting: Occupational Therapy

## 2019-03-27 ENCOUNTER — Encounter: Payer: Self-pay | Admitting: Physical Therapy

## 2019-03-27 DIAGNOSIS — R278 Other lack of coordination: Secondary | ICD-10-CM

## 2019-03-27 DIAGNOSIS — M6281 Muscle weakness (generalized): Secondary | ICD-10-CM

## 2019-03-27 DIAGNOSIS — R2689 Other abnormalities of gait and mobility: Secondary | ICD-10-CM | POA: Diagnosis not present

## 2019-03-27 DIAGNOSIS — R293 Abnormal posture: Secondary | ICD-10-CM

## 2019-03-27 DIAGNOSIS — R29818 Other symptoms and signs involving the nervous system: Secondary | ICD-10-CM

## 2019-03-27 DIAGNOSIS — R29898 Other symptoms and signs involving the musculoskeletal system: Secondary | ICD-10-CM

## 2019-03-27 NOTE — Patient Instructions (Signed)
Coordination Exercises  Perform the following exercises for 20 minutes 1 times per day. Perform with both hand(s). Perform using big movements.  Flipping Cards: Place deck of cards on the table. Flip cards over by opening your hand big to grasp and then turn your palm up big. Deal cards: Hold 1/2 or whole deck in your hand. Use thumb to push card off top of deck with one big push. Rotate ball with fingertips: Pick up with fingers/thumb and move as much as you can with each turn/movement (clockwise and counter-clockwise). Toss ball from one hand to the other: Toss big/high. Toss ball in the air and catch with the same hand: Toss big/high. Pick up coins and place in coin bank or container: Pick up with big, intentional movements. Do not drag coin to the edge. Pick up coins and stack one at a time: Pick up with big, intentional movements. Do not drag coin to the edge. (5-10 in a stack) Pick up 5-10 coins one at a time and hold in palm. Then, move coins from palm to fingertips one at time and place in coin bank/container. Practice writing: Slow down, write big, and focus on forming each letter. Perform "Flicks"/hand stretches (PWR! Hands): Close hands then flick out your fingers with focus on opening hands, pulling wrists back, and extending elbows like you are pushing. 

## 2019-03-27 NOTE — Therapy (Signed)
[facility] [address] [place], [place], [address] Phone: [phone]   Fax:  [phone]  Occupational Therapy Treatment  Patient Details  Name: Todd Rice MRN: [id number] Date of Birth: Mar 10, 1956 Referring Provider (OT): Dr. [name]   Encounter Date: 03/27/2019  OT End of Session - 03/27/19 0833    Visit Number  2    Number of Visits  11    Date for OT Re-Evaluation  05/02/19    Authorization Type  UHC    Authorization Time Period  60 VL 6 used    Authorization - Visit Number  2    Authorization - Number of Visits  11    OT Start Time  0804    OT Stop Time  0844    OT Time Calculation (min)  40 min    Activity Tolerance  Patient tolerated treatment well       Past Medical History:  Diagnosis Date  . Adult BMI 50.0-59.9 kg/sq m    52.77  . Anal fissure   . Anxiety   . Diabetes mellitus without complication (HCC)    borderline  . GERD (gastroesophageal reflux disease)    30 years ago, maybe from peptic ulcer  . H/O: gout    STABLE  . Hepatic steatosis 10/01/11  . History of kidney stones   . Hyperlipidemia   . Hypertension   . Kidney tumor    right upper kidney tumor, monitoring  . OSA on CPAP    [facility]  . Parkinson disease ([place]) 01/04/2016  . REM sleep behavior disorder 12/06/2016  . Right ureteral stone   . Ureteral calculus, right 06/12/2011  . Vitamin D deficiency   . Weakness     Past Surgical History:  Procedure Laterality Date  . ANAL FISSURE REPAIR  10-12-2000  . COLONOSCOPY N/A 07/01/2012   Procedure: COLONOSCOPY;  Surgeon: Lafayette Dragon, MD;  [facility];  Service: Endoscopy;  Laterality: N/A;  . CYSTOSCOPY WITH RETROGRADE PYELOGRAM, URETEROSCOPY AND STENT PLACEMENT Bilateral 02/26/2013   Procedure: CYSTOSCOPY WITH RETROGRADE PYELOGRAM, URETEROSCOPY AND STENT PLACEMENT;  Surgeon: Alexis Frock, MD;  Location: [facility];  Service: Urology;  Laterality: Bilateral;  .  HOLMIUM LASER APPLICATION Bilateral [id number]   Procedure: HOLMIUM LASER APPLICATION;  Surgeon: Alexis Frock, MD;  Location: [facility];  Service: Urology;  Laterality: Bilateral;  . kidney stone removal     06/2011-stent placement  . LEFT MEDIAL FEMORAL CONDYLE DEBRIDEMENT & DRILLING/ REMOVAL LOOSE BODY  09-15-2003  . NASAL SEPTUM SURGERY  1985  . STONE EXTRACTION WITH BASKET  06/12/2011   Procedure: STONE EXTRACTION WITH BASKET;  Surgeon: Claybon Jabs, MD;  [facility];  Service: Urology;  Laterality: Right;    There were no vitals filed for this visit.  Subjective Assessment - 03/27/19 0831    Subjective   Denies pain    Patient Stated Goals  improve dexterity in bilateral UE's    Currently in Pain?  No/denies                Treatment: PWR! Moves basic 4 modified quadraped 10-20 reps each, min v.c for larger amplitude movements. Ambulating while tossing ball between hands then in 1 hand at a time, min v.c for larger amplitude movements           OT Education - 03/27/19 0900    Education Details  coordination HEP, see pt instructions.    Person(s) Educated  Patient  Methods  Explanation;Demonstration    Comprehension  Verbalized understanding;Returned demonstration;Verbal cues required          OT Long Term Goals - 03/26/19 1503      OT LONG TERM GOAL #1   Title  I with PD specific HEP    Time  5    Period  Weeks    Status  New    Target Date  05/02/19      OT LONG TERM GOAL #2   Title  Pt will demonstrate improved RUE functional use for ADLs as evidenced by increasing box and blocks score to 44 blocks or greater.    Baseline  RUE 41, LUE 47    Time  5    Period  Weeks    Status  New      OT LONG TERM GOAL #3   Title  Pt will demonstrate improved dexterity for daily activities including fastening buttons as evidenced by decreasing 3 button/ unbutton time to 40 secs or less.    Time  5    Period  Weeks    Status  New       OT LONG TERM GOAL #4   Title  Pt will verbalize understanding of adapted strategies for increased ease and independence wtih ADLS/ IADLs(ie: picking up pills, small objects)    Time  5    Period  Weeks    Status  New      OT LONG TERM GOAL #5   Title  Pt will demonstrate improved ease with self feeding as evidenced by decreasing PPT#2 to 11 secs or less.    Time  5    Period  Weeks    Status  New            Plan - 03/27/19 0834    Clinical Impression Statement  Pt is progressing towards goals. He demonstrates understanding of inital coordination HEP.    OT Occupational Profile and History  Problem Focused Assessment - Including review of records relating to presenting problem    Occupational performance deficits (Please refer to evaluation for details):  ADL's;IADL's;Play;Leisure    Body Structure / Function / Physical Skills  ADL;UE functional use;Balance;Flexibility;FMC;ROM;GMC;Coordination;Decreased knowledge of precautions;IADL;Decreased knowledge of use of DME;Dexterity;Strength;Mobility;Tone;Edema    Rehab Potential  Good    Clinical Decision Making  Limited treatment options, no task modification necessary    Comorbidities Affecting Occupational Performance:  May have comorbidities impacting occupational performance    Modification or Assistance to Complete Evaluation   No modification of tasks or assist necessary to complete eval    OT Frequency  2x / week    OT Duration  --   5 weeks or 10 visits plus eval over 8 weeks  May d/c sooner dependent upon pt progress.   OT Treatment/Interventions  Self-care/ADL training;Ultrasound;Aquatic Therapy;DME and/or AE instruction;Patient/family education;Balance training;Passive range of motion;Paraffin;Gait Training;Fluidtherapy;Cryotherapy;Moist Heat;Therapeutic exercise;Manual Therapy;Therapeutic activities;Cognitive remediation/compensation;Neuromuscular education    Plan  ADL strategies, coordination activities    Consulted and  Agree with Plan of Care  Patient       Patient will benefit from skilled therapeutic intervention in order to improve the following deficits and impairments:   Body Structure / Function / Physical Skills: ADL, UE functional use, Balance, Flexibility, FMC, ROM, GMC, Coordination, Decreased knowledge of precautions, IADL, Decreased knowledge of use of DME, Dexterity, Strength, Mobility, Tone, Edema       Visit Diagnosis: Muscle weakness (generalized)  Other lack of coordination  Abnormal posture  Other symptoms and signs involving the nervous system  Other abnormalities of gait and mobility  Other symptoms and signs involving the musculoskeletal system    Problem List Patient Active Problem List   Diagnosis Date Noted  . Aortic atherosclerosis ([place]) 06/10/2018  . Controlled type 2 diabetes mellitus with diabetic dermatitis, without long-term current use of insulin ([place]) 01/21/2018  . OSA treated with BiPAP 01/03/2017  . REM sleep behavior disorder 12/06/2016  . Uncontrolled secondary diabetes mellitus with stage 3 CKD (GFR 30-59) (HCC) 10/23/2016  . Medication management 10/23/2016  . Vitamin D deficiency 10/23/2016  . Parkinson disease ([place]) 01/04/2016  . Seasonal and perennial allergic rhinitis 06/21/2014  . Asthma, mild intermittent, well-controlled 06/21/2014  . Pseudomonas aeruginosa colonization 10/08/2012  . CAD (coronary artery disease) 11/15/2011  . Hypertension 11/15/2011  . Hyperlipidemia 11/15/2011  . Morbid obesity with BMI of 40.0-44.9, adult ([place]) 11/15/2011    Yosgar Demirjian 03/27/2019, 9:02 AM Theone Murdoch, OTR/L [phone] Phone: [phone] 9:02 AM 03/27/19 [facility] [address] [place], [place], [address] Phone: [phone]   Fax:  [phone]  Name: Todd Rice MRN: [id number] Date of Birth: 1955-08-19

## 2019-03-27 NOTE — Patient Instructions (Addendum)
Step-Up: Forward (STEP UP/UP, DOWN/DOWN)   Leading with right leg, bring both feet onto __6__ inch step. Return to starting position, leading with right leg. Repeat __10__ times per session. Then lead with left leg.  (Up/up, down/down).  Do _1___ sessions per day.   Copyright  VHI. All rights reserved.  FUNCTIONAL MOBILITY: Lateral Step Up    Step up sideways, leading with right leg.  Hold for 3 seconds.  _10__ reps per set, __1_ sets per day.  Repeat leading with other leg.  Copyright  VHI. All rights reserved.    Single Leg: Opposite in Motion, Front    Slowly place right foot lightly on ___6_ inch step / footstool placed in front. Maintain balance by centering weight over standing leg.  Hold for 3 seconds. Repeat _10_ times per session. Do __1__ sessions per day.   Copyright  VHI. All rights reserved.

## 2019-03-28 NOTE — Therapy (Signed)
[facility] [address] [place], [place], [address] Phone: [phone]   Fax:  [phone]  Physical Therapy Treatment  Patient Details  Name: Todd Rice MRN: [id number] Date of Birth: Nov 15, 1955 Referring Provider (PT): Lenor Coffin   Encounter Date: 03/27/2019  PT End of Session - 03/28/19 0441    Visit Number  5    Number of Visits  8    Authorization Type  UHC    PT Start Time  [phone]    PT Stop Time  0930    PT Time Calculation (min)  41 min    Activity Tolerance  Patient tolerated treatment well    Behavior During Therapy  [facility] for tasks assessed/performed       Past Medical History:  Diagnosis Date  . Adult BMI 50.0-59.9 kg/sq m    52.77  . Anal fissure   . Anxiety   . Diabetes mellitus without complication (HCC)    borderline  . GERD (gastroesophageal reflux disease)    30 years ago, maybe from peptic ulcer  . H/O: gout    STABLE  . Hepatic steatosis 10/01/11  . History of kidney stones   . Hyperlipidemia   . Hypertension   . Kidney tumor    right upper kidney tumor, monitoring  . OSA on CPAP    [facility]  . Parkinson disease ([place]) 01/04/2016  . REM sleep behavior disorder 12/06/2016  . Right ureteral stone   . Ureteral calculus, right 06/12/2011  . Vitamin D deficiency   . Weakness     Past Surgical History:  Procedure Laterality Date  . ANAL FISSURE REPAIR  10-12-2000  . COLONOSCOPY N/A 07/01/2012   Procedure: COLONOSCOPY;  Surgeon: Lafayette Dragon, MD;  Location: [facility];  Service: Endoscopy;  Laterality: N/A;  . CYSTOSCOPY WITH RETROGRADE PYELOGRAM, URETEROSCOPY AND STENT PLACEMENT Bilateral 02/26/2013   Procedure: CYSTOSCOPY WITH RETROGRADE PYELOGRAM, URETEROSCOPY AND STENT PLACEMENT;  Surgeon: Alexis Frock, MD;  Location: [facility];  Service: Urology;  Laterality: Bilateral;  . HOLMIUM LASER APPLICATION Bilateral 77/41/2878   Procedure: HOLMIUM LASER APPLICATION;   Surgeon: Alexis Frock, MD;  Location: [facility];  Service: Urology;  Laterality: Bilateral;  . kidney stone removal     06/2011-stent placement  . LEFT MEDIAL FEMORAL CONDYLE DEBRIDEMENT & DRILLING/ REMOVAL LOOSE BODY  09-15-2003  . NASAL SEPTUM SURGERY  1985  . STONE EXTRACTION WITH BASKET  06/12/2011   Procedure: STONE EXTRACTION WITH BASKET;  Surgeon: Claybon Jabs, MD;  Location: [facility];  Service: Urology;  Laterality: Right;    There were no vitals filed for this visit.  Subjective Assessment - 03/27/19 0849    Subjective  Nothing new since the other day.    Patient Stated Goals  Pt's goal for therapy is to not fall.    Currently in Pain?  No/denies                       [facility] Adult PT Treatment/Exercise - 03/28/19 0435      Transfers   Transfers  Sit to Stand;Stand to Sit    Sit to Stand  6: Modified independent (Device/Increase time);Without upper extremity assist;From chair/3-in-1;From bed    Stand to Sit  6: Modified independent (Device/Increase time);Without upper extremity assist;To chair/3-in-1;To bed    Number of Reps  10 reps;Other sets (comment)   3 sets- from 22" mat, 18", 16" chairs     Ambulation/Gait  Ambulation/Gait  Yes    Ambulation/Gait Assistance  7: Independent    Ambulation Distance (Feet)  600 Feet   512 ft in 2 minute walk   Assistive device  None    Gait Pattern  Step-through pattern;Decreased arm swing - right;Decreased arm swing - left;Decreased dorsiflexion - right;Decreased step length - right;Decreased trunk rotation    Ambulation Surface  Level;Indoor    Gait Comments  Pt reports walking his dog, then walking at the park or neighborhood, up to 30 minutes.  Discussed intensity, intervals, attention to arm swing, foot clearance, and posture.      Knee/Hip Exercises: Standing   Lateral Step Up  Right;Left;1 set;10 reps;Hand Hold: 1;Step Height: 6"    Forward Step Up  Right;Left;10 reps;2 sets;Hand Hold: 1;Step  Height: 6"    Forward Step Up Limitations  Step up/up, down/down 1 set, then forward step up single leg with 3 sec hold 1 set          Balance Exercises - 03/28/19 0438      Balance Exercises: Standing   SLS with Vectors  Solid surface   step taps to 6", 12", then 6/12/6, 10 reps each for SLS   Gait with Head Turns  Forward;Retro;Intermittent upper extremity support;Foam/compliant surface;3 reps   At counter   Sidestepping  Foam/compliant support;3 reps   Additional 3 reps at counter with head turns (blue mat)   Marching Limitations  Forward marching/back marching on blue mat x 3 reps along counter with intermittent UE support        PT Education - 03/28/19 0440    Education Details  Additions to HEP-see instructions    Person(s) Educated  Patient    Methods  Explanation;Demonstration;Handout    Comprehension  Verbalized understanding;Returned demonstration          PT Long Term Goals - 03/27/19 0859      PT LONG TERM GOAL #1   Title  Pt will independent with HEP to address Parkinson's deficits for improved balance and gait.  TARGET 04/11/2019    Time  4    Period  Weeks    Status  New      PT LONG TERM GOAL #2   Title  Pt will improve 5x sit<>stand in less than 10 seconds for improved functional lower extremity strength.    Time  4    Period  Weeks    Status  New      PT LONG TERM GOAL #3   Title  Pt will ambulate at least 450 ft in 2 minutes, for improved gait endurance and efficiency.    Baseline  512 ft in 2 minutes 03/27/2019    Time  4    Period  Weeks    Status  New      PT LONG TERM GOAL #4   Title  Sensory Organization test to be performed, with goal to be written as appropriate.    Baseline  SOT WNL, no goal needed    Time  4    Period  Weeks    Status  Deferred      PT LONG TERM GOAL #5   Title  Pt will verbalize plans for optimal continued fitness upon d/c from PT.    Time  4    Period  Weeks    Status  New            Plan -  03/28/19 0441    Clinical Impression Statement  Addressed functional  strengthening today with sit<>stand and step ups/step taps, with additions to HEP.  Pt has met goal for improved distance on 6MWT, improving distance from 398 (at eval) to 512 ft today; he will conitnue to benefit from skilled PT towards LTGs.    Personal Factors and Comorbidities  Comorbidity 3+    Comorbidities  See PMH    Examination-Activity Limitations  Stand;Locomotion Level    Examination-Participation Restrictions  Yard Work    Stability/Clinical Decision Making  Stable/Uncomplicated    Rehab Potential  Good    PT Frequency  2x / week    PT Duration  4 weeks   Plus eval   PT Treatment/Interventions  ADLs/Self Care Home Management;Gait training;Functional mobility training;Therapeutic activities;Therapeutic exercise;Balance training;Neuromuscular re-education;Patient/family education    PT Next Visit Plan  progress dynamic balance and compliant surfaces as able (pay attention to RLE inversion); ankle strengthening exercises; wk of 11/16 was week 3 of 4; so need to assess remaining goals and discuss d/c    Consulted and Agree with Plan of Care  Patient       Patient will benefit from skilled therapeutic intervention in order to improve the following deficits and impairments:  Abnormal gait, Difficulty walking, Decreased balance, Decreased mobility, Decreased strength, Impaired flexibility, Postural dysfunction  Visit Diagnosis: Muscle weakness (generalized)  Other abnormalities of gait and mobility     Problem List Patient Active Problem List   Diagnosis Date Noted  . Aortic atherosclerosis ([place]) 06/10/2018  . Controlled type 2 diabetes mellitus with diabetic dermatitis, without long-term current use of insulin ([place]) 01/21/2018  . OSA treated with BiPAP 01/03/2017  . REM sleep behavior disorder 12/06/2016  . Uncontrolled secondary diabetes mellitus with stage 3 CKD (GFR 30-59) (HCC) 10/23/2016  . Medication  management 10/23/2016  . Vitamin D deficiency 10/23/2016  . Parkinson disease ([place]) 01/04/2016  . Seasonal and perennial allergic rhinitis 06/21/2014  . Asthma, mild intermittent, well-controlled 06/21/2014  . Pseudomonas aeruginosa colonization 10/08/2012  . CAD (coronary artery disease) 11/15/2011  . Hypertension 11/15/2011  . Hyperlipidemia 11/15/2011  . Morbid obesity with BMI of 40.0-44.9, adult ([place]) 11/15/2011    Kiarrah Rausch W. 03/28/2019, 4:46 AM  Frazier Butt., PT   [facility] [address] [place], [place], [address] Phone: [phone]   Fax:  [phone]  Name: Todd Rice MRN: [id number] Date of Birth: [birth year]

## 2019-03-30 MED FILL — VALSARTAN-HCTZ 320-25 MG TA: 320-25 | 30 days supply | Qty: 30 | Fill #1

## 2019-03-31 ENCOUNTER — Ambulatory Visit: Payer: 59 | Admitting: Physical Therapy

## 2019-03-31 ENCOUNTER — Encounter: Payer: Self-pay | Admitting: Physical Therapy

## 2019-03-31 ENCOUNTER — Other Ambulatory Visit: Payer: Self-pay

## 2019-03-31 DIAGNOSIS — R2689 Other abnormalities of gait and mobility: Secondary | ICD-10-CM | POA: Diagnosis not present

## 2019-03-31 DIAGNOSIS — R2681 Unsteadiness on feet: Secondary | ICD-10-CM

## 2019-03-31 MED FILL — ALLOPURINOL 300 MG TABS: 300 | 30 days supply | Qty: 30 | Fill #10

## 2019-03-31 MED FILL — METFORMIN HCL ER 500 MG TB2: 500 | 30 days supply | Qty: 120 | Fill #9

## 2019-03-31 NOTE — Therapy (Signed)
[facility] [address] [place], [place], [address] Phone: [phone]   Fax:  [phone]  Physical Therapy Treatment  Patient Details  Name: Todd Rice MRN: [id number] Date of Birth: 23-Apr-1956 Referring Provider (PT): Lenor Coffin   Encounter Date: 03/31/2019  PT End of Session - 03/31/19 0803    Visit Number  6    Number of Visits  8    Authorization Type  UHC    PT Start Time  0720    PT Stop Time  0758    PT Time Calculation (min)  38 min    Activity Tolerance  Patient tolerated treatment well    Behavior During Therapy  [facility] for tasks assessed/performed       Past Medical History:  Diagnosis Date  . Adult BMI 50.0-59.9 kg/sq m    52.77  . Anal fissure   . Anxiety   . Diabetes mellitus without complication (HCC)    borderline  . GERD (gastroesophageal reflux disease)    30 years ago, maybe from peptic ulcer  . H/O: gout    STABLE  . Hepatic steatosis 10/01/11  . History of kidney stones   . Hyperlipidemia   . Hypertension   . Kidney tumor    right upper kidney tumor, monitoring  . OSA on CPAP    [facility]  . Parkinson disease ([place]) 01/04/2016  . REM sleep behavior disorder 12/06/2016  . Right ureteral stone   . Ureteral calculus, right 06/12/2011  . Vitamin D deficiency   . Weakness     Past Surgical History:  Procedure Laterality Date  . ANAL FISSURE REPAIR  10-12-2000  . COLONOSCOPY N/A 07/01/2012   Procedure: COLONOSCOPY;  Surgeon: Lafayette Dragon, MD;  [facility];  Service: Endoscopy;  Laterality: N/A;  . CYSTOSCOPY WITH RETROGRADE PYELOGRAM, URETEROSCOPY AND STENT PLACEMENT Bilateral 02/26/2013   Procedure: CYSTOSCOPY WITH RETROGRADE PYELOGRAM, URETEROSCOPY AND STENT PLACEMENT;  Surgeon: Alexis Frock, MD;  [facility];  Service: Urology;  Laterality: Bilateral;  . HOLMIUM LASER APPLICATION Bilateral [id number]   Procedure: HOLMIUM LASER APPLICATION;   Surgeon: Alexis Frock, MD;  [facility];  Service: Urology;  Laterality: Bilateral;  . kidney stone removal     06/2011-stent placement  . LEFT MEDIAL FEMORAL CONDYLE DEBRIDEMENT & DRILLING/ REMOVAL LOOSE BODY  09-15-2003  . NASAL SEPTUM SURGERY  1985  . STONE EXTRACTION WITH BASKET  06/12/2011   Procedure: STONE EXTRACTION WITH BASKET;  Surgeon: Claybon Jabs, MD;  [facility];  Service: Urology;  Laterality: Right;    There were no vitals filed for this visit.  Subjective Assessment - 03/31/19 0720    Subjective  Tripped on a container on the floor the other day, but caught myself and didn't actually fall.    Patient Stated Goals  Pt's goal for therapy is to not fall.    Currently in Pain?  No/denies                       [facility] Adult PT Treatment/Exercise - 03/31/19 0001      High Level Balance   High Level Balance Activities  Turns    High Level Balance Comments  Practiced quarter turns; to R and L, 5 reps each side      Neuro Re-ed    Neuro Re-ed Details   Obstacle negotiation:  stepping around and stepping over balance disks and river stones, then  stepping on obstacles; step over obstacles with short distance gait x 4 reps.        [facility]) - 03/31/19 0730    PWR! exercises  Moves in standing    PWR! Up  x 20 reps    PWR! Rock  x 10 reps each side    PWR! Twist  x 10 reps each side    PWR Step  x 10 reps each side, cues for step height, more diagonal step    Comments  First set of 10:  slow, stretch positions, then second set of 10, increased pace, still attention to form       Balance Exercises - 03/31/19 0722      Balance Exercises: Standing   Stepping Strategy  Anterior;Posterior;Lateral;Foam/compliant surface;UE support;10 reps   Cues ofr increased step height   Other Standing Exercises  Sidestepping on and off [employer], R and L 10 reps, cues for foot placement.  Black obstacle blocks placed beside patient:  side step  over obstacle x 5 reps each side; then forward step over obscle x 5 reps each leg.        PT Education - 03/31/19 0803    Education Details  PWR! Moves added to HEP (pt already has these; needs reminders to perform consistently    Person(s) Educated  Patient    Methods  Explanation;Demonstration;Handout;Verbal cues    Comprehension  Verbalized understanding;Returned demonstration;Verbal cues required          PT Long Term Goals - 03/27/19 0859      PT LONG TERM GOAL #1   Title  Pt will independent with HEP to address Parkinson's deficits for improved balance and gait.  TARGET 04/11/2019    Time  4    Period  Weeks    Status  New      PT LONG TERM GOAL #2   Title  Pt will improve 5x sit<>stand in less than 10 seconds for improved functional lower extremity strength.    Time  4    Period  Weeks    Status  New      PT LONG TERM GOAL #3   Title  Pt will ambulate at least 450 ft in 2 minutes, for improved gait endurance and efficiency.    Baseline  512 ft in 2 minutes 03/27/2019    Time  4    Period  Weeks    Status  New      PT LONG TERM GOAL #4   Title  Sensory Organization test to be performed, with goal to be written as appropriate.    Baseline  SOT WNL, no goal needed    Time  4    Period  Weeks    Status  Deferred      PT LONG TERM GOAL #5   Title  Pt will verbalize plans for optimal continued fitness upon d/c from PT.    Time  4    Period  Weeks    Status  New            Plan - 03/31/19 0804    Clinical Impression Statement  Addressed standing balance, obstacle negotiation today, as pt reports he tripped over box at home this weekend (did not fall).  PT provides cues for slowed, deliberate pace for stepping for initial stepping and for balance recovery.    Personal Factors and Comorbidities  Comorbidity 3+    Comorbidities  See PMH    Examination-Activity Limitations  Stand;Locomotion  Level    Examination-Participation Restrictions  Yard Work     Stability/Clinical Decision Making  Stable/Uncomplicated    Rehab Potential  Good    PT Frequency  2x / week    PT Duration  4 weeks   Plus eval   PT Treatment/Interventions  ADLs/Self Care Home Management;Gait training;Functional mobility training;Therapeutic activities;Therapeutic exercise;Balance training;Neuromuscular re-education;Patient/family education    PT Next Visit Plan  progress dynamic balance and compliant surfaces as able (pay attention to RLE inversion); ankle strengthening exercises; wk of 11/23 is week 4 of 4; remaining goals need to be assessed; recert for final 1-2 visits    Consulted and Agree with Plan of Care  Patient       Patient will benefit from skilled therapeutic intervention in order to improve the following deficits and impairments:  Abnormal gait, Difficulty walking, Decreased balance, Decreased mobility, Decreased strength, Impaired flexibility, Postural dysfunction  Visit Diagnosis: Other abnormalities of gait and mobility  Unsteadiness on feet     Problem List Patient Active Problem List   Diagnosis Date Noted  . Aortic atherosclerosis ([place]) 06/10/2018  . Controlled type 2 diabetes mellitus with diabetic dermatitis, without long-term current use of insulin ([place]) 01/21/2018  . OSA treated with BiPAP 01/03/2017  . REM sleep behavior disorder 12/06/2016  . Uncontrolled secondary diabetes mellitus with stage 3 CKD (GFR 30-59) (HCC) 10/23/2016  . Medication management 10/23/2016  . Vitamin D deficiency 10/23/2016  . Parkinson disease ([place]) 01/04/2016  . Seasonal and perennial allergic rhinitis 06/21/2014  . Asthma, mild intermittent, well-controlled 06/21/2014  . Pseudomonas aeruginosa colonization 10/08/2012  . CAD (coronary artery disease) 11/15/2011  . Hypertension 11/15/2011  . Hyperlipidemia 11/15/2011  . Morbid obesity with BMI of 40.0-44.9, adult ([place]) 11/15/2011    Aryssa Rosamond W. 03/31/2019, 8:09 AM  Frazier Butt., PT   [facility] [address] [place], [place], [address] Phone: [phone]   Fax:  [phone]  Name: Todd Rice MRN: [id number] Date of Birth: 1956-04-17

## 2019-04-02 ENCOUNTER — Ambulatory Visit: Payer: 59 | Admitting: Occupational Therapy

## 2019-04-02 ENCOUNTER — Encounter: Payer: Self-pay | Admitting: Occupational Therapy

## 2019-04-02 ENCOUNTER — Other Ambulatory Visit: Payer: Self-pay

## 2019-04-02 DIAGNOSIS — R2689 Other abnormalities of gait and mobility: Secondary | ICD-10-CM | POA: Diagnosis not present

## 2019-04-02 DIAGNOSIS — R29818 Other symptoms and signs involving the nervous system: Secondary | ICD-10-CM

## 2019-04-02 DIAGNOSIS — R293 Abnormal posture: Secondary | ICD-10-CM

## 2019-04-02 DIAGNOSIS — R29898 Other symptoms and signs involving the musculoskeletal system: Secondary | ICD-10-CM

## 2019-04-02 DIAGNOSIS — R2681 Unsteadiness on feet: Secondary | ICD-10-CM

## 2019-04-02 DIAGNOSIS — R278 Other lack of coordination: Secondary | ICD-10-CM

## 2019-04-02 DIAGNOSIS — M6281 Muscle weakness (generalized): Secondary | ICD-10-CM

## 2019-04-02 NOTE — Therapy (Signed)
[facility] [address] [place], [place], [address] Phone: [phone]   Fax:  [phone]  Occupational Therapy Treatment  Patient Details  Name: Todd Rice MRN: [id number] Date of Birth: 12-May-1955 Referring Provider (OT): Dr. [name]   Encounter Date: 04/02/2019  OT End of Session - 04/02/19 0853    Visit Number  3    Number of Visits  11    Date for OT Re-Evaluation  05/02/19    Authorization Type  UHC    Authorization Time Period  60 VL 6 used    Authorization - Visit Number  3    Authorization - Number of Visits  11    OT Start Time  [phone]    OT Stop Time  0932    OT Time Calculation (min)  41 min    Activity Tolerance  Patient tolerated treatment well    Behavior During Therapy  [facility] for tasks assessed/performed       Past Medical History:  Diagnosis Date  . Adult BMI 50.0-59.9 kg/sq m    52.77  . Anal fissure   . Anxiety   . Diabetes mellitus without complication (HCC)    borderline  . GERD (gastroesophageal reflux disease)    30 years ago, maybe from peptic ulcer  . H/O: gout    STABLE  . Hepatic steatosis 10/01/11  . History of kidney stones   . Hyperlipidemia   . Hypertension   . Kidney tumor    right upper kidney tumor, monitoring  . OSA on CPAP    [facility]  . Parkinson disease ([place]) 01/04/2016  . REM sleep behavior disorder 12/06/2016  . Right ureteral stone   . Ureteral calculus, right 06/12/2011  . Vitamin D deficiency   . Weakness     Past Surgical History:  Procedure Laterality Date  . ANAL FISSURE REPAIR  10-12-2000  . COLONOSCOPY N/A 07/01/2012   Procedure: COLONOSCOPY;  Surgeon: Lafayette Dragon, MD;  Location: [facility];  Service: Endoscopy;  Laterality: N/A;  . CYSTOSCOPY WITH RETROGRADE PYELOGRAM, URETEROSCOPY AND STENT PLACEMENT Bilateral 02/26/2013   Procedure: CYSTOSCOPY WITH RETROGRADE PYELOGRAM, URETEROSCOPY AND STENT PLACEMENT;  Surgeon: Alexis Frock, MD;   Location: [facility];  Service: Urology;  Laterality: Bilateral;  . HOLMIUM LASER APPLICATION Bilateral [id number]   Procedure: HOLMIUM LASER APPLICATION;  Surgeon: Alexis Frock, MD;  Location: [facility];  Service: Urology;  Laterality: Bilateral;  . kidney stone removal     06/2011-stent placement  . LEFT MEDIAL FEMORAL CONDYLE DEBRIDEMENT & DRILLING/ REMOVAL LOOSE BODY  09-15-2003  . NASAL SEPTUM SURGERY  1985  . STONE EXTRACTION WITH BASKET  06/12/2011   Procedure: STONE EXTRACTION WITH BASKET;  Surgeon: Claybon Jabs, MD;  Location: [facility];  Service: Urology;  Laterality: Right;    There were no vitals filed for this visit.  Subjective Assessment - 04/02/19 0853    Subjective   So simple but so hard    Patient Stated Goals  improve dexterity in bilateral UE's    Currently in Pain?  No/denies           Sliding small and large cards across table with emphasis/v.c. for incr movement amplitude with both hands, particularly R hand.  Began with small and progressed with large.  Emphasized/cued for large amplitude reach/proprioceptive input with hand to slap hand flat to retrieve cards when pt did not slide card all the way across table for reinforcement of large amplitude.  Standing functional step and reach to the side to flip large cards with mod cueing for full elbow, finger extension every time and for supination with target for visual reinforcement for large amplitude with RUE.   Placing grooved pegs in pegboard for in-hand manipulation with R hand with min v.c. for opening hand, deliberate movement.  Pulling bag into each hand with min-mod cueing, particularly with R hand for deliberate large amplitude movements and wrist movement.  Forearm gym with each UE for large amplitude isolated wrist movement with target for improved rigidity and coordination with min v.c.     OT Long Term Goals - 03/26/19 1503      OT LONG TERM GOAL #1   Title  I with PD specific HEP     Time  5    Period  Weeks    Status  New    Target Date  05/02/19      OT LONG TERM GOAL #2   Title  Pt will demonstrate improved RUE functional use for ADLs as evidenced by increasing box and blocks score to 44 blocks or greater.    Baseline  RUE 41, LUE 47    Time  5    Period  Weeks    Status  New      OT LONG TERM GOAL #3   Title  Pt will demonstrate improved dexterity for daily activities including fastening buttons as evidenced by decreasing 3 button/ unbutton time to 40 secs or less.    Time  5    Period  Weeks    Status  New      OT LONG TERM GOAL #4   Title  Pt will verbalize understanding of adapted strategies for increased ease and independence wtih ADLS/ IADLs(ie: picking up pills, small objects)    Time  5    Period  Weeks    Status  New      OT LONG TERM GOAL #5   Title  Pt will demonstrate improved ease with self feeding as evidenced by decreasing PPT#2 to 11 secs or less.    Time  5    Period  Weeks    Status  New            Plan - 04/02/19 [id number]    Clinical Impression Statement  Pt responds well to cueing for large amplitude movements with improved performance and coordination with cueing and repetition.    OT Occupational Profile and History  Problem Focused Assessment - Including review of records relating to presenting problem    Occupational performance deficits (Please refer to evaluation for details):  ADL's;IADL's;Play;Leisure    Body Structure / Function / Physical Skills  ADL;UE functional use;Balance;Flexibility;FMC;ROM;GMC;Coordination;Decreased knowledge of precautions;IADL;Decreased knowledge of use of DME;Dexterity;Strength;Mobility;Tone;Edema    Rehab Potential  Good    Clinical Decision Making  Limited treatment options, no task modification necessary    Comorbidities Affecting Occupational Performance:  May have comorbidities impacting occupational performance    Modification or Assistance to Complete Evaluation   No modification of  tasks or assist necessary to complete eval    OT Frequency  2x / week    OT Duration  --   5 weeks or 10 visits plus eval over 8 weeks   OT Treatment/Interventions  Self-care/ADL training;Ultrasound;Aquatic Therapy;DME and/or AE instruction;Patient/family education;Balance training;Passive range of motion;Paraffin;Gait Training;Fluidtherapy;Cryotherapy;Moist Heat;Therapeutic exercise;Manual Therapy;Therapeutic activities;Cognitive remediation/compensation;Neuromuscular education    Plan  ADL strategies, coordination activities, bilateral coordination tasks    Consulted and Agree with  Plan of Care  Patient       Patient will benefit from skilled therapeutic intervention in order to improve the following deficits and impairments:   Body Structure / Function / Physical Skills: ADL, UE functional use, Balance, Flexibility, FMC, ROM, GMC, Coordination, Decreased knowledge of precautions, IADL, Decreased knowledge of use of DME, Dexterity, Strength, Mobility, Tone, Edema       Visit Diagnosis: Other lack of coordination  Abnormal posture  Other symptoms and signs involving the nervous system  Other symptoms and signs involving the musculoskeletal system  Muscle weakness (generalized)  Unsteadiness on feet  Other abnormalities of gait and mobility    Problem List Patient Active Problem List   Diagnosis Date Noted  . Aortic atherosclerosis ([place]) 06/10/2018  . Controlled type 2 diabetes mellitus with diabetic dermatitis, without long-term current use of insulin ([place]) 01/21/2018  . OSA treated with BiPAP 01/03/2017  . REM sleep behavior disorder 12/06/2016  . Uncontrolled secondary diabetes mellitus with stage 3 CKD (GFR 30-59) (HCC) 10/23/2016  . Medication management 10/23/2016  . Vitamin D deficiency 10/23/2016  . Parkinson disease ([place]) 01/04/2016  . Seasonal and perennial allergic rhinitis 06/21/2014  . Asthma, mild intermittent, well-controlled 06/21/2014  . Pseudomonas  aeruginosa colonization 10/08/2012  . CAD (coronary artery disease) 11/15/2011  . Hypertension 11/15/2011  . Hyperlipidemia 11/15/2011  . Morbid obesity with BMI of 40.0-44.9, adult ([place]) 11/15/2011    [facility] 04/02/2019, 9:45 AM  [place] [address] [place], [place], [address] Phone: [phone]   Fax:  [phone]  Name: Todd Rice MRN: [id number] Date of Birth: 1955-12-06   Vianne Bulls, OTR/L [facility] [address] [place], [place]  [address] [phone] phone [phone] 04/02/19 9:45 AM

## 2019-04-07 ENCOUNTER — Encounter: Payer: Self-pay | Admitting: Physical Therapy

## 2019-04-07 ENCOUNTER — Other Ambulatory Visit: Payer: Self-pay

## 2019-04-07 ENCOUNTER — Ambulatory Visit: Payer: 59 | Admitting: Physical Therapy

## 2019-04-07 DIAGNOSIS — R293 Abnormal posture: Secondary | ICD-10-CM

## 2019-04-07 DIAGNOSIS — R29818 Other symptoms and signs involving the nervous system: Secondary | ICD-10-CM

## 2019-04-07 DIAGNOSIS — M6281 Muscle weakness (generalized): Secondary | ICD-10-CM

## 2019-04-07 DIAGNOSIS — R2689 Other abnormalities of gait and mobility: Secondary | ICD-10-CM | POA: Diagnosis not present

## 2019-04-07 DIAGNOSIS — R2681 Unsteadiness on feet: Secondary | ICD-10-CM

## 2019-04-07 NOTE — Patient Instructions (Signed)
Cues to perform short distance walking in house with DELIBERATE RLE swing through and step length/EXAGERRATE RLE motion for improved foot clearance  Partial stance head turns/head nods EO and EC-perform at home standing on compliant surface  Do your PWR! Moves exercises daily!

## 2019-04-07 NOTE — Therapy (Signed)
[place] [address] [place], [place], [address] Phone: [phone]   Fax:  [phone]  Physical Therapy Treatment  Patient Details  Name: Todd Rice MRN: [id number] Date of Birth: 1956/01/13 Referring Provider (PT): Lenor Coffin   Encounter Date: 04/07/2019  PT End of Session - 04/07/19 1130    Visit Number  7    Number of Visits  8    Authorization Type  UHC    PT Start Time  [phone]    PT Stop Time  0757    PT Time Calculation (min)  41 min    Activity Tolerance  Patient tolerated treatment well    Behavior During Therapy  [facility] for tasks assessed/performed       Past Medical History:  Diagnosis Date  . Adult BMI 50.0-59.9 kg/sq m    52.77  . Anal fissure   . Anxiety   . Diabetes mellitus without complication (HCC)    borderline  . GERD (gastroesophageal reflux disease)    30 years ago, maybe from peptic ulcer  . H/O: gout    STABLE  . Hepatic steatosis 10/01/11  . History of kidney stones   . Hyperlipidemia   . Hypertension   . Kidney tumor    right upper kidney tumor, monitoring  . OSA on CPAP    [facility]  . Parkinson disease ([place]) 01/04/2016  . REM sleep behavior disorder 12/06/2016  . Right ureteral stone   . Ureteral calculus, right 06/12/2011  . Vitamin D deficiency   . Weakness     Past Surgical History:  Procedure Laterality Date  . ANAL FISSURE REPAIR  10-12-2000  . COLONOSCOPY N/A 07/01/2012   Procedure: COLONOSCOPY;  Surgeon: Lafayette Dragon, MD;  [facility];  Service: Endoscopy;  Laterality: N/A;  . CYSTOSCOPY WITH RETROGRADE PYELOGRAM, URETEROSCOPY AND STENT PLACEMENT Bilateral 02/26/2013   Procedure: CYSTOSCOPY WITH RETROGRADE PYELOGRAM, URETEROSCOPY AND STENT PLACEMENT;  Surgeon: Alexis Frock, MD;  Location: [facility];  Service: Urology;  Laterality: Bilateral;  . HOLMIUM LASER APPLICATION Bilateral 56/81/2751   Procedure: HOLMIUM LASER APPLICATION;   Surgeon: Alexis Frock, MD;  Location: [facility];  Service: Urology;  Laterality: Bilateral;  . kidney stone removal     06/2011-stent placement  . LEFT MEDIAL FEMORAL CONDYLE DEBRIDEMENT & DRILLING/ REMOVAL LOOSE BODY  09-15-2003  . NASAL SEPTUM SURGERY  1985  . STONE EXTRACTION WITH BASKET  06/12/2011   Procedure: STONE EXTRACTION WITH BASKET;  Surgeon: Claybon Jabs, MD;  Location[facility];  Service: Urology;  Laterality: Right;    There were no vitals filed for this visit.  Subjective Assessment - 04/07/19 0719    Subjective  Sore today from working hard around the house and yard.    Patient Stated Goals  Pt's goal for therapy is to not fall.    Currently in Pain?  No/denies                       [facility] Adult PT Treatment/Exercise - 04/07/19 0001      Transfers   Transfers  Sit to Stand;Stand to Sit    Sit to Stand  6: Modified independent (Device/Increase time);Without upper extremity assist;From chair/3-in-1;From bed    Five time sit to stand comments   11.1    Stand to Sit  6: Modified independent (Device/Increase time);Without upper extremity assist;To chair/3-in-1;To bed      Ambulation/Gait   Ambulation/Gait  Yes    Ambulation/Gait Assistance  7: Independent    Ambulation Distance (Feet)  730 Feet    Assistive device  None    Gait Pattern  Step-through pattern;Decreased arm swing - right;Decreased arm swing - left;Decreased dorsiflexion - right;Decreased step length - right;Decreased trunk rotation    Ambulation Surface  Level;Indoor    Gait Comments  2 minute walk test:  488 ft; 3 minute walk test:  727 ft      Knee/Hip Exercises: Standing   Other Standing Knee Exercises  Reviewed step up/up, down/down x 10 reps each leg leading; single limb forward step ups x 5 reps, then lateral step ups x 5 reps each leg.  Minimal UE support, cues to slow pace, deliberated RLE movement to clear foot from step.       Neuro Re-education    PWR!  exercises  Moves in standing     PWR! Up  x 10 reps    PWR! Rock  x 10 reps each side    PWR! Twist  x 10 reps each side    PWR Step  x 10 reps each side, cues for step height, more diagonal step    Comments  First set of 10:  slow, stretch positions, then second set of 10, increased pace, still attention to form      Cues for deliberate movement patterns, focus on deliberate, large motions; hold at end ranges for optimal BIG effort   Balance Exercises - 04/07/19 0738      Balance Exercises: Standing   Gait with Head Turns  Forward;Intermittent upper extremity support;Foam/compliant surface;2 reps   20 ft x 2 laps at counter; head nods   Partial Tandem Stance  Eyes open;Eyes closed;Foam/compliant surface;5 reps   Solid surface x 5 reps; head turns/head nods EO and EC   Other Standing Exercises  Forward/back step and weigthshift x 10 reps, cue for foot clearance.  Short distance gait, 4 reps, (25 ft) with cues for exagerrated, deliberate R foot clearance, R step length.        PT Education - 04/07/19 1130    Education Details  Updates to HEP    Person(s) Educated  Patient    Methods  Explanation;Demonstration;Verbal cues    Comprehension  Verbalized understanding;Returned demonstration;Verbal cues required          PT Long Term Goals - 04/07/19 0726      PT LONG TERM GOAL #1   Title  Pt will independent with HEP to address Parkinson's deficits for improved balance and gait.  TARGET 04/11/2019    Time  1   per recert 03/01/8526   Period  Weeks    Status  On-going      PT LONG TERM GOAL #2   Title  Pt will improve 5x sit<>stand in less than 10 seconds for improved functional lower extremity strength.    Baseline  11.1 sec; 04/07/2019    Time  1    Period  Weeks    Status  On-going      PT LONG TERM GOAL #3   Title  Pt will ambulate at least 450 ft in 2 minutes, for improved gait endurance and efficiency.    Baseline  512 ft in 2 minutes 03/27/2019; 488 ft  04/07/2019    Time  4    Period  Weeks    Status  Achieved      PT LONG TERM GOAL #4   Title  Sensory Organization test to  be performed, with goal to be written as appropriate.    Baseline  SOT WNL, no goal needed    Time  4    Period  Weeks    Status  Deferred      PT LONG TERM GOAL #5   Title  Pt will verbalize plans for optimal continued fitness upon d/c from PT.    Time  1    Period  Weeks    Status  On-going            Plan - 04/07/19 1131    Clinical Impression Statement  Began assessing LTGs, this visit, with pt meeting LTG 3 for improved gait distance in 2 minute walk test.  LTG 4 was deferred for SOT (No goal was needed).  LTG 1, 2, 5 not yet met and are ongoing (pt needs to be recerted for this week of therapy, due to several weeks of 1x/wk scheduling).  Pt is making progress towards goals, but needs reminders for importance of consistent HEP performance.    Personal Factors and Comorbidities  Comorbidity 3+    Comorbidities  See PMH    Examination-Activity Limitations  Stand;Locomotion Level    Examination-Participation Restrictions  Yard Work    Stability/Clinical Decision Making  Stable/Uncomplicated    Rehab Potential  Good    PT Frequency  1x / week    PT Duration  Other (comment)   1 week, per recert 15/40/0867   PT Treatment/Interventions  ADLs/Self Care Home Management;Gait training;Functional mobility training;Therapeutic activities;Therapeutic exercise;Balance training;Neuromuscular re-education;Patient/family education    PT Next Visit Plan  Check remaining LTGs; consider exercise chart for HEP; discuss ongoing fitness (online) opportunities and plan for PT d/c next visit    Consulted and Agree with Plan of Care  Patient       Patient will benefit from skilled therapeutic intervention in order to improve the following deficits and impairments:  Abnormal gait, Difficulty walking, Decreased balance, Decreased mobility, Decreased strength, Impaired  flexibility, Postural dysfunction  Visit Diagnosis: Other abnormalities of gait and mobility  Unsteadiness on feet  Other symptoms and signs involving the nervous system  Muscle weakness (generalized)  Abnormal posture     Problem List Patient Active Problem List   Diagnosis Date Noted  . Aortic atherosclerosis ([place]) 06/10/2018  . Controlled type 2 diabetes mellitus with diabetic dermatitis, without long-term current use of insulin ([place]) 01/21/2018  . OSA treated with BiPAP 01/03/2017  . REM sleep behavior disorder 12/06/2016  . Uncontrolled secondary diabetes mellitus with stage 3 CKD (GFR 30-59) (HCC) 10/23/2016  . Medication management 10/23/2016  . Vitamin D deficiency 10/23/2016  . Parkinson disease ([place]) 01/04/2016  . Seasonal and perennial allergic rhinitis 06/21/2014  . Asthma, mild intermittent, well-controlled 06/21/2014  . Pseudomonas aeruginosa colonization 10/08/2012  . CAD (coronary artery disease) 11/15/2011  . Hypertension 11/15/2011  . Hyperlipidemia 11/15/2011  . Morbid obesity with BMI of 40.0-44.9, adult ([place]) 11/15/2011    Nephtali Docken W. 04/07/2019, 11:35 AM  Frazier Butt., PT   [facility] [address] [place], [place], [address] Phone: [phone]   Fax:  [phone]  Name: KAICEN DESENA MRN: [id number] Date of Birth: December 19, 1955

## 2019-04-08 ENCOUNTER — Ambulatory Visit: Payer: 59 | Attending: Internal Medicine | Admitting: Occupational Therapy

## 2019-04-08 ENCOUNTER — Encounter: Payer: Self-pay | Admitting: Occupational Therapy

## 2019-04-08 DIAGNOSIS — R2681 Unsteadiness on feet: Secondary | ICD-10-CM | POA: Diagnosis present

## 2019-04-08 DIAGNOSIS — R278 Other lack of coordination: Secondary | ICD-10-CM | POA: Diagnosis present

## 2019-04-08 DIAGNOSIS — R293 Abnormal posture: Secondary | ICD-10-CM | POA: Diagnosis present

## 2019-04-08 DIAGNOSIS — M6281 Muscle weakness (generalized): Secondary | ICD-10-CM | POA: Diagnosis present

## 2019-04-08 DIAGNOSIS — R29898 Other symptoms and signs involving the musculoskeletal system: Secondary | ICD-10-CM | POA: Diagnosis present

## 2019-04-08 DIAGNOSIS — R29818 Other symptoms and signs involving the nervous system: Secondary | ICD-10-CM | POA: Diagnosis present

## 2019-04-08 DIAGNOSIS — R2689 Other abnormalities of gait and mobility: Secondary | ICD-10-CM | POA: Insufficient documentation

## 2019-04-08 NOTE — Therapy (Signed)
[facility] [address] [place], [place], [address] Phone: [phone]   Fax:  [phone]  Occupational Therapy Treatment  Patient Details  Name: Todd Rice MRN: [id number] Date of Birth: 12-21-1955 Referring Provider (OT): Dr. [name]   Encounter Date: 04/08/2019  OT End of Session - 04/08/19 0914    Visit Number  4    Number of Visits  11    Date for OT Re-Evaluation  05/02/19    Authorization Type  [employer]    Authorization Time Period  60 VL 6 used    Authorization - Visit Number  4    Authorization - Number of Visits  11    OT Start Time  [phone]    OT Stop Time  0930    OT Time Calculation (min)  38 min    Activity Tolerance  Patient tolerated treatment well    Behavior During Therapy  [facility] for tasks assessed/performed       Past Medical History:  Diagnosis Date  . Adult BMI 50.0-59.9 kg/sq m    52.77  . Anal fissure   . Anxiety   . Diabetes mellitus without complication (HCC)    borderline  . GERD (gastroesophageal reflux disease)    30 years ago, maybe from peptic ulcer  . H/O: gout    STABLE  . Hepatic steatosis 10/01/11  . History of kidney stones   . Hyperlipidemia   . Hypertension   . Kidney tumor    right upper kidney tumor, monitoring  . OSA on CPAP    [facility]  . Parkinson disease ([place]) 01/04/2016  . REM sleep behavior disorder 12/06/2016  . Right ureteral stone   . Ureteral calculus, right 06/12/2011  . Vitamin D deficiency   . Weakness     Past Surgical History:  Procedure Laterality Date  . ANAL FISSURE REPAIR  10-12-2000  . COLONOSCOPY N/A 07/01/2012   Procedure: COLONOSCOPY;  Surgeon: Lafayette Dragon, MD;  Location: [facility];  Service: Endoscopy;  Laterality: N/A;  . CYSTOSCOPY WITH RETROGRADE PYELOGRAM, URETEROSCOPY AND STENT PLACEMENT Bilateral 02/26/2013   Procedure: CYSTOSCOPY WITH RETROGRADE PYELOGRAM, URETEROSCOPY AND STENT PLACEMENT;  Surgeon: Alexis Frock, MD;   Location: [facility];  Service: Urology;  Laterality: Bilateral;  . HOLMIUM LASER APPLICATION Bilateral [id number]   Procedure: HOLMIUM LASER APPLICATION;  Surgeon: Alexis Frock, MD;  Location: [facility];  Service: Urology;  Laterality: Bilateral;  . kidney stone removal     06/2011-stent placement  . LEFT MEDIAL FEMORAL CONDYLE DEBRIDEMENT & DRILLING/ REMOVAL LOOSE BODY  09-15-2003  . NASAL SEPTUM SURGERY  1985  . STONE EXTRACTION WITH BASKET  06/12/2011   Procedure: STONE EXTRACTION WITH BASKET;  Surgeon: Claybon Jabs, MD;  Location: [facility];  Service: Urology;  Laterality: Right;    There were no vitals filed for this visit.  Subjective Assessment - 04/08/19 0912    Subjective   I can feel it (stretch) in my forearm    Patient Stated Goals  improve dexterity in bilateral UE's    Currently in Pain?  No/denies       Wall stretch for shoulder ER and abduction/pect stretch/composite UE ext stretch with min v.c. for incr amplitude  Wall push ups with PWR! Up in standing with min v.c. for incr amplitude.   Standing, rolling ball up wall for shoulder flex, elbow ext, hand est stretch with min v.c. for incr amplitude  Simulated ADLs with bag with focus/min  cues for large amplitude movements:  Donning/doffing pull-over shirt with min difficulty, donning/doffing pants with mod-max difficulty, pulling bag into hand for clothing adjustment/donning socks with min-mod difficulty, drying back with min difficulty.   Fastening/unfastening buttons with min v.c. for PWR! Hands and min difficulty   Red Digi-ext for PWR! Hands out to the side with each UE      OT Long Term Goals - 03/26/19 1503      OT LONG TERM GOAL #1   Title  I with PD specific HEP    Time  5    Period  Weeks    Status  New    Target Date  05/02/19      OT LONG TERM GOAL #2   Title  Pt will demonstrate improved RUE functional use for ADLs as evidenced by increasing box and blocks score to 44 blocks or  greater.    Baseline  RUE 41, LUE 47    Time  5    Period  Weeks    Status  New      OT LONG TERM GOAL #3   Title  Pt will demonstrate improved dexterity for daily activities including fastening buttons as evidenced by decreasing 3 button/ unbutton time to 40 secs or less.    Time  5    Period  Weeks    Status  New      OT LONG TERM GOAL #4   Title  Pt will verbalize understanding of adapted strategies for increased ease and independence wtih ADLS/ IADLs(ie: picking up pills, small objects)    Time  5    Period  Weeks    Status  New      OT LONG TERM GOAL #5   Title  Pt will demonstrate improved ease with self feeding as evidenced by decreasing PPT#2 to 11 secs or less.    Time  5    Period  Weeks    Status  New            Plan - 04/08/19 0915    Clinical Impression Statement  Pt responds well to cueing for large amplitude movements with improved performance and coordination with cueing and repetition.  Pt with improved coordination today and needed less consistent cueing.    OT Occupational Profile and History  Problem Focused Assessment - Including review of records relating to presenting problem    Occupational performance deficits (Please refer to evaluation for details):  ADL's;IADL's;Play;Leisure    Body Structure / Function / Physical Skills  ADL;UE functional use;Balance;Flexibility;FMC;ROM;GMC;Coordination;Decreased knowledge of precautions;IADL;Decreased knowledge of use of DME;Dexterity;Strength;Mobility;Tone;Edema    Rehab Potential  Good    Clinical Decision Making  Limited treatment options, no task modification necessary    Comorbidities Affecting Occupational Performance:  May have comorbidities impacting occupational performance    Modification or Assistance to Complete Evaluation   No modification of tasks or assist necessary to complete eval    OT Frequency  2x / week    OT Duration  --   5 weeks or 10 visits plus eval over 8 weeks   OT  Treatment/Interventions  Self-care/ADL training;Ultrasound;Aquatic Therapy;DME and/or AE instruction;Patient/family education;Balance training;Passive range of motion;Paraffin;Gait Training;Fluidtherapy;Cryotherapy;Moist Heat;Therapeutic exercise;Manual Therapy;Therapeutic activities;Cognitive remediation/compensation;Neuromuscular education    Plan  ADL strategies, coordination activities, bilateral coordination tasks    Consulted and Agree with Plan of Care  Patient       Patient will benefit from skilled therapeutic intervention in order to improve the following deficits and impairments:  Body Structure / Function / Physical Skills: ADL, UE functional use, Balance, Flexibility, FMC, ROM, GMC, Coordination, Decreased knowledge of precautions, IADL, Decreased knowledge of use of DME, Dexterity, Strength, Mobility, Tone, Edema       Visit Diagnosis: Other symptoms and signs involving the nervous system  Other symptoms and signs involving the musculoskeletal system  Other lack of coordination  Abnormal posture  Unsteadiness on feet  Other abnormalities of gait and mobility    Problem List Patient Active Problem List   Diagnosis Date Noted  . Aortic atherosclerosis ([place]) 06/10/2018  . Controlled type 2 diabetes mellitus with diabetic dermatitis, without long-term current use of insulin ([place]) 01/21/2018  . OSA treated with BiPAP 01/03/2017  . REM sleep behavior disorder 12/06/2016  . Uncontrolled secondary diabetes mellitus with stage 3 CKD (GFR 30-59) (HCC) 10/23/2016  . Medication management 10/23/2016  . Vitamin D deficiency 10/23/2016  . Parkinson disease ([place]) 01/04/2016  . Seasonal and perennial allergic rhinitis 06/21/2014  . Asthma, mild intermittent, well-controlled 06/21/2014  . Pseudomonas aeruginosa colonization 10/08/2012  . CAD (coronary artery disease) 11/15/2011  . Hypertension 11/15/2011  . Hyperlipidemia 11/15/2011  . Morbid obesity with BMI of 40.0-44.9,  adult ([place]) 11/15/2011    [facility] 04/08/2019, 1:21 PM  [facility] [address] [place], [place], [address] Phone: [phone]   Fax:  [phone]  Name: MARGARITO BONIFAY MRN: [id number] Date of Birth: [birth year]   Vianne Bulls, OTR/L [facility] [address] [place], [place]  [address] [phone] phone [phone] 04/08/19 1:21 PM

## 2019-04-11 ENCOUNTER — Encounter: Payer: Self-pay | Admitting: Physical Therapy

## 2019-04-11 ENCOUNTER — Ambulatory Visit: Payer: 59 | Admitting: Occupational Therapy

## 2019-04-11 ENCOUNTER — Other Ambulatory Visit: Payer: Self-pay

## 2019-04-11 ENCOUNTER — Ambulatory Visit: Payer: 59 | Admitting: Physical Therapy

## 2019-04-11 DIAGNOSIS — R293 Abnormal posture: Secondary | ICD-10-CM

## 2019-04-11 DIAGNOSIS — M6281 Muscle weakness (generalized): Secondary | ICD-10-CM

## 2019-04-11 DIAGNOSIS — R278 Other lack of coordination: Secondary | ICD-10-CM

## 2019-04-11 DIAGNOSIS — R2689 Other abnormalities of gait and mobility: Secondary | ICD-10-CM

## 2019-04-11 DIAGNOSIS — R29898 Other symptoms and signs involving the musculoskeletal system: Secondary | ICD-10-CM

## 2019-04-11 DIAGNOSIS — R2681 Unsteadiness on feet: Secondary | ICD-10-CM

## 2019-04-11 DIAGNOSIS — R29818 Other symptoms and signs involving the nervous system: Secondary | ICD-10-CM | POA: Diagnosis not present

## 2019-04-11 NOTE — Patient Instructions (Signed)
(  Exercise) Monday Tuesday Wednesday Thursday Friday Saturday Sunday    PWR! Moves Standing            PWR! Moves Seated            Standing balance (head turns/nods)            Ankle Exercises            Walking                       Aerobic Activity: Stationary bike Seated stepper

## 2019-04-11 NOTE — Therapy (Signed)
[facility] [address] [place], [place], [address] Phone: [phone]   Fax:  [phone]  Occupational Therapy Treatment  Patient Details  Name: Todd Rice MRN: [id number] Date of Birth: 02-14-1956 Referring Provider (OT): Dr. [name]   Encounter Date: 04/11/2019  OT End of Session - 04/11/19 0836    Visit Number  5    Number of Visits  11    Date for OT Re-Evaluation  05/02/19    Authorization Type  UHC    Authorization Time Period  60 VL 6 used    Authorization - Visit Number  5    Authorization - Number of Visits  11    OT Start Time  0803    OT Stop Time  0845    OT Time Calculation (min)  42 min    Activity Tolerance  Patient tolerated treatment well    Behavior During Therapy  [facility] for tasks assessed/performed       Past Medical History:  Diagnosis Date  . Adult BMI 50.0-59.9 kg/sq m    52.77  . Anal fissure   . Anxiety   . Diabetes mellitus without complication (HCC)    borderline  . GERD (gastroesophageal reflux disease)    30 years ago, maybe from peptic ulcer  . H/O: gout    STABLE  . Hepatic steatosis 10/01/11  . History of kidney stones   . Hyperlipidemia   . Hypertension   . Kidney tumor    right upper kidney tumor, monitoring  . OSA on CPAP    [facility]  . Parkinson disease ([place]) 01/04/2016  . REM sleep behavior disorder 12/06/2016  . Right ureteral stone   . Ureteral calculus, right 06/12/2011  . Vitamin D deficiency   . Weakness     Past Surgical History:  Procedure Laterality Date  . ANAL FISSURE REPAIR  10-12-2000  . COLONOSCOPY N/A 07/01/2012   Procedure: COLONOSCOPY;  Surgeon: Lafayette Dragon, MD;  [facility];  Service: Endoscopy;  Laterality: N/A;  . CYSTOSCOPY WITH RETROGRADE PYELOGRAM, URETEROSCOPY AND STENT PLACEMENT Bilateral 02/26/2013   Procedure: CYSTOSCOPY WITH RETROGRADE PYELOGRAM, URETEROSCOPY AND STENT PLACEMENT;  Surgeon: Alexis Frock, MD;   [facility];  Service: Urology;  Laterality: Bilateral;  . HOLMIUM LASER APPLICATION Bilateral [id number]   Procedure: HOLMIUM LASER APPLICATION;  Surgeon: Alexis Frock, MD;  Location: [facility];  Service: Urology;  Laterality: Bilateral;  . kidney stone removal     06/2011-stent placement  . LEFT MEDIAL FEMORAL CONDYLE DEBRIDEMENT & DRILLING/ REMOVAL LOOSE BODY  09-15-2003  . NASAL SEPTUM SURGERY  1985  . STONE EXTRACTION WITH BASKET  06/12/2011   Procedure: STONE EXTRACTION WITH BASKET;  Surgeon: Claybon Jabs, MD;  Location: [facility];  Service: Urology;  Laterality: Right;    There were no vitals filed for this visit.  Subjective Assessment - 04/11/19 0819    Subjective   Pt reports hurting his shoulder while exercising    Patient Stated Goals  improve dexterity in bilateral UE's    Currently in Pain?  Yes    Pain Score  5     Pain Location  Shoulder    Pain Orientation  Left    Pain Descriptors / Indicators  Aching    Pain Type  Acute pain    Pain Onset  In the past 7 days    Pain Frequency  Intermittent    Aggravating Factors   overuse  Pain Relieving Factors  gentle stretching              Treatment: Supine PWR! Up and rock followed by standing to perform PWR! Rock in corner for gentle stretching, min v.c for larger amplitude movements Wall slides for gentle shoulder flexion stretch, min v.c PWR! Hands basic 4 for warm up Seated at table flicking small playing cards for increased finger extension, min v.c for larger amplitude Functional step and reach wile flipping large playing cards min v.c Bilateral functional reaching to stack blocks simultaneously with left and right hands then un stacking, min vc. Fastening buttons with PWR! Hands, min v.c                  OT Long Term Goals - 04/11/19 0831      OT LONG TERM GOAL #1   Title  I with PD specific HEP    Time  5    Period  Weeks    Status  New      OT LONG TERM GOAL #2    Title  Pt will demonstrate improved RUE functional use for ADLs as evidenced by increasing box and blocks score to 44 blocks or greater.    Baseline  RUE 41, LUE 47    Time  5    Period  Weeks    Status  New      OT LONG TERM GOAL #3   Title  Pt will demonstrate improved dexterity for daily activities including fastening buttons as evidenced by decreasing 3 button/ unbutton time to 40 secs or less.    Time  5    Period  Weeks    Status  New      OT LONG TERM GOAL #4   Title  Pt will verbalize understanding of adapted strategies for increased ease and independence wtih ADLS/ IADLs(ie: picking up pills, small objects)    Time  5    Period  Weeks    Status  New      OT LONG TERM GOAL #5   Title  Pt will demonstrate improved ease with self feeding as evidenced by decreasing PPT#2 to 11 secs or less.    Time  5    Period  Weeks    Status  New            Plan - 04/11/19 [id number]    Clinical Impression Statement  Pt is progresssing towards goals. He reports left shoulder pain today after exercising quickly at home.    OT Occupational Profile and History  Problem Focused Assessment - Including review of records relating to presenting problem    Occupational performance deficits (Please refer to evaluation for details):  ADL's;IADL's;Play;Leisure    Body Structure / Function / Physical Skills  ADL;UE functional use;Balance;Flexibility;FMC;ROM;GMC;Coordination;Decreased knowledge of precautions;IADL;Decreased knowledge of use of DME;Dexterity;Strength;Mobility;Tone;Edema    Rehab Potential  Good    Clinical Decision Making  Limited treatment options, no task modification necessary    Comorbidities Affecting Occupational Performance:  May have comorbidities impacting occupational performance    Modification or Assistance to Complete Evaluation   No modification of tasks or assist necessary to complete eval    OT Frequency  2x / week    OT Duration  --   5 weeks or 10 visits plus eval  over 8 weeks   OT Treatment/Interventions  Self-care/ADL training;Ultrasound;Aquatic Therapy;DME and/or AE instruction;Patient/family education;Balance training;Passive range of motion;Paraffin;Gait Training;Fluidtherapy;Cryotherapy;Moist Heat;Therapeutic exercise;Manual Therapy;Therapeutic activities;Cognitive remediation/compensation;Neuromuscular education    Plan  ADL strategies, coordination activities, bilateral coordination tasks    Consulted and Agree with Plan of Care  Patient       Patient will benefit from skilled therapeutic intervention in order to improve the following deficits and impairments:   Body Structure / Function / Physical Skills: ADL, UE functional use, Balance, Flexibility, FMC, ROM, GMC, Coordination, Decreased knowledge of precautions, IADL, Decreased knowledge of use of DME, Dexterity, Strength, Mobility, Tone, Edema       Visit Diagnosis: Other symptoms and signs involving the nervous system  Other symptoms and signs involving the musculoskeletal system  Other lack of coordination  Abnormal posture  Muscle weakness (generalized)    Problem List Patient Active Problem List   Diagnosis Date Noted  . Aortic atherosclerosis ([place]) 06/10/2018  . Controlled type 2 diabetes mellitus with diabetic dermatitis, without long-term current use of insulin ([place]) 01/21/2018  . OSA treated with BiPAP 01/03/2017  . REM sleep behavior disorder 12/06/2016  . Uncontrolled secondary diabetes mellitus with stage 3 CKD (GFR 30-59) (HCC) 10/23/2016  . Medication management 10/23/2016  . Vitamin D deficiency 10/23/2016  . Parkinson disease ([place]) 01/04/2016  . Seasonal and perennial allergic rhinitis 06/21/2014  . Asthma, mild intermittent, well-controlled 06/21/2014  . Pseudomonas aeruginosa colonization 10/08/2012  . CAD (coronary artery disease) 11/15/2011  . Hypertension 11/15/2011  . Hyperlipidemia 11/15/2011  . Morbid obesity with BMI of 40.0-44.9, adult ([place])  11/15/2011    RINE,KATHRYN 04/11/2019, 3:16 PM  [facility] [address] [place], [place], [address] Phone: [phone]   Fax:  [phone]  Name: Todd Rice MRN: [id number] Date of Birth: 17-Nov-1955

## 2019-04-12 NOTE — Therapy (Signed)
[facility] [address] [place], [place], [address] Phone: [phone]   Fax:  [phone]  Physical Therapy Treatment/Discharge Summary  Patient Details  Name: Todd Rice MRN: [id number] Date of Birth: 05/18/1955 Referring Provider (PT): Willis, Charles Keith   Encounter Date: 04/11/2019  PT End of Session - 04/12/19 0945    Visit Number  8    Number of Visits  8    Authorization Type  UHC    PT Start Time  0717    PT Stop Time  0759    PT Time Calculation (min)  42 min    Activity Tolerance  Patient tolerated treatment well    Behavior During Therapy  WFL for tasks assessed/performed       Past Medical History:  Diagnosis Date  . Adult BMI 50.0-59.9 kg/sq m    52.77  . Anal fissure   . Anxiety   . Diabetes mellitus without complication (HCC)    borderline  . GERD (gastroesophageal reflux disease)    30 years ago, maybe from peptic ulcer  . H/O: gout    STABLE  . Hepatic steatosis 10/01/11  . History of kidney stones   . Hyperlipidemia   . Hypertension   . Kidney tumor    right upper kidney tumor, monitoring  . OSA on CPAP    [facility]  . Parkinson disease (HCC) 01/04/2016  . REM sleep behavior disorder 12/06/2016  . Right ureteral stone   . Ureteral calculus, right 06/12/2011  . Vitamin D deficiency   . Weakness     Past Surgical History:  Procedure Laterality Date  . ANAL FISSURE REPAIR  10-12-2000  . COLONOSCOPY N/A 07/01/2012   Procedure: COLONOSCOPY;  Surgeon: Dora M Brodie, MD;  Location: [facility];  Service: Endoscopy;  Laterality: N/A;  . CYSTOSCOPY WITH RETROGRADE PYELOGRAM, URETEROSCOPY AND STENT PLACEMENT Bilateral 02/26/2013   Procedure: CYSTOSCOPY WITH RETROGRADE PYELOGRAM, URETEROSCOPY AND STENT PLACEMENT;  Surgeon: Theodore Manny, MD;  [facility]: [facility];  Service: Urology;  Laterality: Bilateral;  . HOLMIUM LASER APPLICATION Bilateral 02/26/2013   Procedure: HOLMIUM LASER  APPLICATION;  Surgeon: Theodore Manny, MD;  Location: [facility];  Service: Urology;  Laterality: Bilateral;  . kidney stone removal     06/2011-stent placement  . LEFT MEDIAL FEMORAL CONDYLE DEBRIDEMENT & DRILLING/ REMOVAL LOOSE BODY  09-15-2003  . NASAL SEPTUM SURGERY  1985  . STONE EXTRACTION WITH BASKET  06/12/2011   Procedure: STONE EXTRACTION WITH BASKET;  Surgeon: Mark C Ottelin, MD;  Location: [facility];  Service: Urology;  Laterality: Right;    There were no vitals filed for this visit.  Subjective Assessment - 04/11/19 0713    Subjective  Pulled a muscle in my L shoulder stretching yesterday.    Patient Stated Goals  Pt's goal for therapy is to not fall.    Currently in Pain?  Yes    Pain Score  3     Pain Location  Shoulder    Pain Orientation  Left    Pain Descriptors / Indicators  Aching    Pain Type  Acute pain    Pain Onset  Yesterday    Pain Frequency  Intermittent    Aggravating Factors   raising up my shoulder    Pain Relieving Factors  sitting, rubbing my shoulder                       [facility] Adult PT   Treatment/Exercise - 04/11/19 0727      Transfers   Transfers  Sit to Stand;Stand to Sit    Sit to Stand  6: Modified independent (Device/Increase time);Without upper extremity assist;From chair/3-in-1    Five time sit to stand comments   11.12    Stand to Sit  6: Modified independent (Device/Increase time);Without upper extremity assist;To chair/3-in-1      Ambulation/Gait   Ambulation/Gait  Yes    Ambulation/Gait Assistance  7: Independent    Ambulation Distance (Feet)  465 Feet   230   Assistive device  None    Gait Pattern  Step-through pattern;Decreased arm swing - right;Decreased arm swing - left;Decreased dorsiflexion - right;Decreased step length - right;Decreased trunk rotation    Ambulation Surface  Level;Indoor    Gait velocity  8.97 sec = 3.66 ft/sec      Standardized Balance Assessment   Standardized Balance Assessment   Timed Up and Go Test      Timed Up and Go Test   TUG  Normal TUG    Normal TUG (seconds)  11.06      Self-Care   Self-Care  Other Self-Care Comments    Other Self-Care Comments   Discussed progress towards goals, plans for d/c this visit.  Provided exercise chart for tracking, organizing exercises.  Discussed importance of consistent HEP performance after d/c from PT.  Discussed and provided patient with inforamtion for online PD exercise resources.      Knee/Hip Exercises: Aerobic   Stepper  Seated Scifit, Level 2, 4 extremities, x 5 minutes for flexibility, aerobic strengthening.  Pt starts RPM a 80, and able to keep above 80, as high as 99 RPM      REviewed corner balance exercise, with pt in partial tandem stance with EO and EC head turns/head nods, x 5 reps; progressed to tandem stance with EO head turns and head nods x 5 reps with intermittent UE support.  Gait x 20-50 ft, 4 reps, with added head turns and head nods, no LOB noted.       PT Education - 04/12/19 0944    Education Details  Exercise chart, online PD exercises, plans for d/c this visit; return screen in 6-9 months (once discharged from OT)    Person(s) Educated  Patient    Methods  Explanation;Handout    Comprehension  Verbalized understanding          PT Long Term Goals - 04/11/19 0733      PT LONG TERM GOAL #1   Title  Pt will independent with HEP to address Parkinson's deficits for improved balance and gait.  TARGET 04/11/2019    Time  1   per recert 04/07/2019   Period  Weeks    Status  Achieved      PT LONG TERM GOAL #2   Title  Pt will improve 5x sit<>stand in less than 10 seconds for improved functional lower extremity strength.    Baseline  11.1 sec; 04/07/2019; 11.12 sec 04/11/2019    Time  1    Period  Weeks    Status  Not Met      PT LONG TERM GOAL #3   Title  Pt will ambulate at least 450 ft in 2 minutes, for improved gait endurance and efficiency.    Baseline  512 ft in 2 minutes  03/27/2019; 488 ft 04/07/2019    Time  4    Period  Weeks    Status  Achieved        PT LONG TERM GOAL #4   Title  Sensory Organization test to be performed, with goal to be written as appropriate.    Baseline  SOT WNL, no goal needed    Time  4    Period  Weeks    Status  Deferred      PT LONG TERM GOAL #5   Title  Pt will verbalize plans for optimal continued fitness upon d/c from PT.    Baseline  Pt verbalizes walking daily, HEP daily; provided info on additional exercise opportunities    Time  1    Period  Weeks    Status  Achieved            Plan - 04/12/19 0946    Clinical Impression Statement  Assessed remaining LTGs this visit, with pt meeting LTG 1 (HEP) and LTG 5 (ongoing fitness plans).  LTG 2 not met for 5x sit<>stand score.  Pt appears to appreciate information given throughout course of therapy and has been provided with HEP and online PD exercise options as well as options for aerobic activity.  He is appropriate for d/c from PT this visit.    Personal Factors and Comorbidities  Comorbidity 3+    Comorbidities  See PMH    Examination-Activity Limitations  Stand;Locomotion Level    Examination-Participation Restrictions  Yard Work    Stability/Clinical Decision Making  Stable/Uncomplicated    Rehab Potential  Good    PT Frequency  1x / week    PT Duration  Other (comment)   1 week, per recert 04/07/2019   PT Treatment/Interventions  ADLs/Self Care Home Management;Gait training;Functional mobility training;Therapeutic activities;Therapeutic exercise;Balance training;Neuromuscular re-education;Patient/family education    PT Next Visit Plan  Discharge this visit; plan for return PT screen in 6-9 months (to be scheduled once OT discharges)    Consulted and Agree with Plan of Care  Patient       Patient will benefit from skilled therapeutic intervention in order to improve the following deficits and impairments:  Abnormal gait, Difficulty walking, Decreased balance,  Decreased mobility, Decreased strength, Impaired flexibility, Postural dysfunction  Visit Diagnosis: Other abnormalities of gait and mobility  Unsteadiness on feet  Muscle weakness (generalized)     Problem List Patient Active Problem List   Diagnosis Date Noted  . Aortic atherosclerosis (HCC) 06/10/2018  . Controlled type 2 diabetes mellitus with diabetic dermatitis, without long-term current use of insulin (HCC) 01/21/2018  . OSA treated with BiPAP 01/03/2017  . REM sleep behavior disorder 12/06/2016  . Uncontrolled secondary diabetes mellitus with stage 3 CKD (GFR 30-59) (HCC) 10/23/2016  . Medication management 10/23/2016  . Vitamin D deficiency 10/23/2016  . Parkinson disease (HCC) 01/04/2016  . Seasonal and perennial allergic rhinitis 06/21/2014  . Asthma, mild intermittent, well-controlled 06/21/2014  . Pseudomonas aeruginosa colonization 10/08/2012  . CAD (coronary artery disease) 11/15/2011  . Hypertension 11/15/2011  . Hyperlipidemia 11/15/2011  . Morbid obesity with BMI of 40.0-44.9, adult (HCC) 11/15/2011    MARRIOTT,AMY W. 04/12/2019, 9:52 AM  MARRIOTT,AMY W., PT   [facility] [address] [place], [place], [address] Phone: [phone]   Fax:  [phone]  Name: Todd Rice MRN: [id number] Date of Birth: 10/02/1955   PHYSICAL THERAPY DISCHARGE SUMMARY  Visits from Start of Care: 8  Current functional level related to goals / functional outcomes: PT Long Term Goals - 04/11/19 0733      PT LONG TERM GOAL #1   Title    Pt will independent with HEP to address Parkinson's deficits for improved balance and gait.  TARGET 04/11/2019    Time  1   per recert 85/27/7824   Period  Weeks    Status  Achieved      PT LONG TERM GOAL #2   Title  Pt will improve 5x sit<>stand in less than 10 seconds for improved functional lower extremity strength.    Baseline  11.1 sec; 04/07/2019; 11.12 sec  04/11/2019    Time  1    Period  Weeks    Status  Not Met      PT LONG TERM GOAL #3   Title  Pt will ambulate at least 450 ft in 2 minutes, for improved gait endurance and efficiency.    Baseline  512 ft in 2 minutes 03/27/2019; 488 ft 04/07/2019    Time  4    Period  Weeks    Status  Achieved      PT LONG TERM GOAL #4   Title  Sensory Organization test to be performed, with goal to be written as appropriate.    Baseline  SOT WNL, no goal needed    Time  4    Period  Weeks    Status  Deferred      PT LONG TERM GOAL #5   Title  Pt will verbalize plans for optimal continued fitness upon d/c from PT.    Baseline  Pt verbalizes walking daily, HEP daily; provided info on additional exercise opportunities    Time  1    Period  Weeks    Status  Achieved      Pt has met 3 of 4 goals (LTG 4 was deferred/not needed).     Remaining deficits: High level balance, functional strength   Education / Equipment: Educated in [employer], options for continued community exercise.  Plan: Patient agrees to discharge.  Patient goals were partially met. Patient is being discharged due to being pleased with the current functional level.  ?????Recommend return PT screen in 6 months.    Mady Haagensen, PT 04/12/19 9:56 AM Phone: [phone] Fax: [phone]

## 2019-04-15 ENCOUNTER — Encounter: Payer: Self-pay | Admitting: Occupational Therapy

## 2019-04-15 ENCOUNTER — Ambulatory Visit: Payer: 59 | Admitting: Occupational Therapy

## 2019-04-15 ENCOUNTER — Other Ambulatory Visit: Payer: Self-pay

## 2019-04-15 DIAGNOSIS — R29898 Other symptoms and signs involving the musculoskeletal system: Secondary | ICD-10-CM

## 2019-04-15 DIAGNOSIS — R278 Other lack of coordination: Secondary | ICD-10-CM

## 2019-04-15 DIAGNOSIS — R29818 Other symptoms and signs involving the nervous system: Secondary | ICD-10-CM | POA: Diagnosis not present

## 2019-04-15 DIAGNOSIS — R2681 Unsteadiness on feet: Secondary | ICD-10-CM

## 2019-04-15 DIAGNOSIS — R293 Abnormal posture: Secondary | ICD-10-CM

## 2019-04-15 DIAGNOSIS — R2689 Other abnormalities of gait and mobility: Secondary | ICD-10-CM

## 2019-04-15 NOTE — Therapy (Signed)
[facility] [address] [place], [place], [address] Phone: [phone]   Fax:  [phone]  Occupational Therapy Treatment  Patient Details  Name: Todd Rice MRN: [id number] Date of Birth: 27-Sep-1955 Referring Provider (OT): Dr. [name]   Encounter Date: 04/15/2019  OT End of Session - 04/15/19 0847    Visit Number  6    Number of Visits  11    Date for OT Re-Evaluation  05/02/19    Authorization Type  UHC    Authorization Time Period  60 VL 6 used    Authorization - Visit Number  6    Authorization - Number of Visits  11    OT Start Time  0848    OT Stop Time  0930    OT Time Calculation (min)  42 min    Activity Tolerance  Patient tolerated treatment well    Behavior During Therapy  [facility] for tasks assessed/performed       Past Medical History:  Diagnosis Date  . Adult BMI 50.0-59.9 kg/sq m    52.77  . Anal fissure   . Anxiety   . Diabetes mellitus without complication (HCC)    borderline  . GERD (gastroesophageal reflux disease)    30 years ago, maybe from peptic ulcer  . H/O: gout    STABLE  . Hepatic steatosis 10/01/11  . History of kidney stones   . Hyperlipidemia   . Hypertension   . Kidney tumor    right upper kidney tumor, monitoring  . OSA on CPAP    [facility]  . Parkinson disease ([place]) 01/04/2016  . REM sleep behavior disorder 12/06/2016  . Right ureteral stone   . Ureteral calculus, right 06/12/2011  . Vitamin D deficiency   . Weakness     Past Surgical History:  Procedure Laterality Date  . ANAL FISSURE REPAIR  10-12-2000  . COLONOSCOPY N/A 07/01/2012   Procedure: COLONOSCOPY;  Surgeon: Lafayette Dragon, MD;  [facility];  Service: Endoscopy;  Laterality: N/A;  . CYSTOSCOPY WITH RETROGRADE PYELOGRAM, URETEROSCOPY AND STENT PLACEMENT Bilateral 02/26/2013   Procedure: CYSTOSCOPY WITH RETROGRADE PYELOGRAM, URETEROSCOPY AND STENT PLACEMENT;  Surgeon: Alexis Frock, MD;   [facility];  Service: Urology;  Laterality: Bilateral;  . HOLMIUM LASER APPLICATION Bilateral [id number]   Procedure: HOLMIUM LASER APPLICATION;  Surgeon: Alexis Frock, MD;  [facility];  Service: Urology;  Laterality: Bilateral;  . kidney stone removal     06/2011-stent placement  . LEFT MEDIAL FEMORAL CONDYLE DEBRIDEMENT & DRILLING/ REMOVAL LOOSE BODY  09-15-2003  . NASAL SEPTUM SURGERY  1985  . STONE EXTRACTION WITH BASKET  06/12/2011   Procedure: STONE EXTRACTION WITH BASKET;  Surgeon: Claybon Jabs, MD;  Location: [facility];  Service: Urology;  Laterality: Right;    There were no vitals filed for this visit.  Subjective Assessment - 04/15/19 0846    Subjective   Pt reports shoulder is still achy with certain movements    Patient Stated Goals  improve dexterity in bilateral UE's    Currently in Pain?  Yes    Pain Score  --   3.5   Pain Location  Shoulder    Pain Orientation  Left    Pain Descriptors / Indicators  Aching    Pain Type  Acute pain    Pain Onset  1 to 4 weeks ago    Pain Frequency  Intermittent    Aggravating Factors  shoulder abduction overhead    Pain Relieving Factors  gentle stretching        Stacking/unstacking blocks for 10-block towers with each hand simultaneously with min cueing for large amplitude and timing/coordination of bilateral UEs.  Picking up various everyday objects with each hand and then BUEs simultaneously with min cueing for large amplitude and bilateral coordination.  Sitting, closed-chain shoulder flex and diagnals floor>overhead to each side with ball with BUEs for trunk rotation, wt. Shift, balance, and UE stretch.  Standing, tossing ball (chest pass and overhead) with BUEs for bilateral coordination and min cueing/emphasis on large amplitude, open hands and extending elbows.  Simultaneous bilateral coordination activities with min cues for incr amplitude and bilateral coordination:  Picking up and  stacking coins, then manipulating in hand to place in containers with min difficulty/incr time.  Pulling bag in hand for finger flex/ext and wrist movements with mod difficulty, particularly with bilateral coordination.  Flipping cards with min cueing for incr movement amplitude and coordination.     OT Education - 04/15/19 1443    Education Details  Additional Bilateral Coordination HEP with focus on large amplitude and incr cognitive demand--see pt instructions    Person(s) Educated  Patient    Methods  Explanation;Demonstration;Handout;Verbal cues   v.c. for incr movement amplitude   Comprehension  Verbalized understanding;Returned demonstration          OT Long Term Goals - 04/11/19 0831      OT LONG TERM GOAL #1   Title  I with PD specific HEP    Time  5    Period  Weeks    Status  New      OT LONG TERM GOAL #2   Title  Pt will demonstrate improved RUE functional use for ADLs as evidenced by increasing box and blocks score to 44 blocks or greater.    Baseline  RUE 41, LUE 47    Time  5    Period  Weeks    Status  New      OT LONG TERM GOAL #3   Title  Pt will demonstrate improved dexterity for daily activities including fastening buttons as evidenced by decreasing 3 button/ unbutton time to 40 secs or less.    Time  5    Period  Weeks    Status  New      OT LONG TERM GOAL #4   Title  Pt will verbalize understanding of adapted strategies for increased ease and independence wtih ADLS/ IADLs(ie: picking up pills, small objects)    Time  5    Period  Weeks    Status  New      OT LONG TERM GOAL #5   Title  Pt will demonstrate improved ease with self feeding as evidenced by decreasing PPT#2 to 11 secs or less.    Time  5    Period  Weeks    Status  New            Plan - 04/15/19 0848    Clinical Impression Statement  Pt is progresssing towards goals. He continues to report some achiness in L shoulder.   Pt with improved bilateral coordination/timing with cueing  and repetition.    OT Occupational Profile and History  Problem Focused Assessment - Including review of records relating to presenting problem    Occupational performance deficits (Please refer to evaluation for details):  ADL's;IADL's;Play;Leisure    Body Structure / Function / Physical Skills  ADL;UE functional  use;Balance;Flexibility;FMC;ROM;GMC;Coordination;Decreased knowledge of precautions;IADL;Decreased knowledge of use of DME;Dexterity;Strength;Mobility;Tone;Edema    Rehab Potential  Good    Clinical Decision Making  Limited treatment options, no task modification necessary    Comorbidities Affecting Occupational Performance:  May have comorbidities impacting occupational performance    Modification or Assistance to Complete Evaluation   No modification of tasks or assist necessary to complete eval    OT Frequency  2x / week    OT Duration  --   5 weeks or 10 visits plus eval over 8 weeks   OT Treatment/Interventions  Self-care/ADL training;Ultrasound;Aquatic Therapy;DME and/or AE instruction;Patient/family education;Balance training;Passive range of motion;Paraffin;Gait Training;Fluidtherapy;Cryotherapy;Moist Heat;Therapeutic exercise;Manual Therapy;Therapeutic activities;Cognitive remediation/compensation;Neuromuscular education    Plan  ADL strategies, coordination activities, bilateral coordination tasks    Consulted and Agree with Plan of Care  Patient       Patient will benefit from skilled therapeutic intervention in order to improve the following deficits and impairments:   Body Structure / Function / Physical Skills: ADL, UE functional use, Balance, Flexibility, FMC, ROM, GMC, Coordination, Decreased knowledge of precautions, IADL, Decreased knowledge of use of DME, Dexterity, Strength, Mobility, Tone, Edema       Visit Diagnosis: Other symptoms and signs involving the nervous system  Other symptoms and signs involving the musculoskeletal system  Other lack of  coordination  Abnormal posture  Other abnormalities of gait and mobility  Unsteadiness on feet    Problem List Patient Active Problem List   Diagnosis Date Noted  . Aortic atherosclerosis ([place]) 06/10/2018  . Controlled type 2 diabetes mellitus with diabetic dermatitis, without long-term current use of insulin ([place]) 01/21/2018  . OSA treated with BiPAP 01/03/2017  . REM sleep behavior disorder 12/06/2016  . Uncontrolled secondary diabetes mellitus with stage 3 CKD (GFR 30-59) (HCC) 10/23/2016  . Medication management 10/23/2016  . Vitamin D deficiency 10/23/2016  . Parkinson disease ([place]) 01/04/2016  . Seasonal and perennial allergic rhinitis 06/21/2014  . Asthma, mild intermittent, well-controlled 06/21/2014  . Pseudomonas aeruginosa colonization 10/08/2012  . CAD (coronary artery disease) 11/15/2011  . Hypertension 11/15/2011  . Hyperlipidemia 11/15/2011  . Morbid obesity with BMI of 40.0-44.9, adult [facility]) 11/15/2011    [facility] 04/15/2019, 2:46 PM  [facility] [address] [place], [place], [address] Phone: [phone]   Fax:  [phone]  Name: Todd Rice MRN: [id number] Date of Birth: [birth year]   Vianne Bulls, OTR/L [facility] [address] [place], [place]  [address] [phone] phone [phone] 04/15/19 2:48 PM

## 2019-04-15 NOTE — Patient Instructions (Signed)
    Coordination Exercises for Extra Mental & Physical Challenge:  1.  Try all of your coordination exercises with both hands at the same time and make sure you are moving big!  2.  Toss ball with both hands against wall.  Push elbows straight and extend fingers to toss and open hands big to catch.

## 2019-04-16 MED FILL — CARBIDOPA-LEVODOPA 25-100 T: 25-100 | 30 days supply | Qty: 180 | Fill #6

## 2019-04-18 ENCOUNTER — Ambulatory Visit: Payer: 59 | Admitting: Occupational Therapy

## 2019-04-18 ENCOUNTER — Other Ambulatory Visit: Payer: Self-pay

## 2019-04-18 ENCOUNTER — Encounter: Payer: Self-pay | Admitting: Occupational Therapy

## 2019-04-18 DIAGNOSIS — R293 Abnormal posture: Secondary | ICD-10-CM

## 2019-04-18 DIAGNOSIS — R2681 Unsteadiness on feet: Secondary | ICD-10-CM

## 2019-04-18 DIAGNOSIS — R29818 Other symptoms and signs involving the nervous system: Secondary | ICD-10-CM

## 2019-04-18 DIAGNOSIS — R29898 Other symptoms and signs involving the musculoskeletal system: Secondary | ICD-10-CM

## 2019-04-18 DIAGNOSIS — R2689 Other abnormalities of gait and mobility: Secondary | ICD-10-CM

## 2019-04-18 DIAGNOSIS — R278 Other lack of coordination: Secondary | ICD-10-CM

## 2019-04-18 NOTE — Patient Instructions (Addendum)
   Ways to prevent future Parkinson's related complications:  1.   Exercise regularly.  Perform your therapy exercises and incorporate safe aerobic exercise when possible (swimming, stationary bike, arm bike, seated stepper)  2.   Focus on BIGGER movements during daily activities- really reach overhead, straighten elbows and extend fingers  3.   When dressing or reaching for your seatbelt make sure to use your body to assist by twisting and looking at where you are reaching while you reach--this can help to minimize stress on the shoulder and reduce the risk of a rotator cuff tear  4.   Anytime you reach or move shoulder, make sure you have good upright posture.  5.  When you reach for something overhead, make sure your thumb is facing up.  This is a better position for your shoulder.  6.  Swing your arms when you walk!  People with PD are at increased risk for frozen shoulder and swinging your arms can reduce this risk.  7.  Keep you feet apart when you are standing to allow you to have better balance and reach further (which can help with shoulder rigidity).  Also make sure your feet are apart when you are sitting before you stand up.                       Extension (Assistive Putty)   Roll putty back and forth, being sure to use all fingertips. Repeat 3 times. Do 1-2 sessions per day.     Finger and Thumb Extension (Resistive Putty)   With thumb and all fingers in center of putty donut, stretch out. Repeat 5-10 times. Do 1-2 sessions per day.  Can do with each finger as well.       FINGERS: Extension (Putty)    Open hand and fingers to flatten putty. 5-10 reps per set, 1 sets per day, 3 days per week

## 2019-04-18 NOTE — Therapy (Signed)
[facility] [address] [place], [place], [address] Phone: [phone]   Fax:  [phone]  Occupational Therapy Treatment  Patient Details  Name: Todd Rice MRN: [id number] Date of Birth: [birth year] Referring Provider (OT): Dr. [name]   Encounter Date: 04/18/2019  OT End of Session - 04/18/19 0901    Visit Number  7    Number of Visits  11    Date for OT Re-Evaluation  05/02/19    Authorization Type  [employer]    Authorization Time Period  60 VL 6 used    Authorization - Visit Number  6    Authorization - Number of Visits  11    OT Start Time  [phone]    OT Stop Time  0930    OT Time Calculation (min)  38 min    Activity Tolerance  Patient tolerated treatment well    Behavior During Therapy  [facility] for tasks assessed/performed       Past Medical History:  Diagnosis Date  . Adult BMI 50.0-59.9 kg/sq m    52.77  . Anal fissure   . Anxiety   . Diabetes mellitus without complication (HCC)    borderline  . GERD (gastroesophageal reflux disease)    30 years ago, maybe from peptic ulcer  . H/O: gout    STABLE  . Hepatic steatosis 10/01/11  . History of kidney stones   . Hyperlipidemia   . Hypertension   . Kidney tumor    right upper kidney tumor, monitoring  . OSA on CPAP    [facility]  . Parkinson disease ([place]) 01/04/2016  . REM sleep behavior disorder 12/06/2016  . Right ureteral stone   . Ureteral calculus, right 06/12/2011  . Vitamin D deficiency   . Weakness     Past Surgical History:  Procedure Laterality Date  . ANAL FISSURE REPAIR  10-12-2000  . COLONOSCOPY N/A 07/01/2012   Procedure: COLONOSCOPY;  Surgeon: Lafayette Dragon, MD;  Location: [facility];  Service: Endoscopy;  Laterality: N/A;  . CYSTOSCOPY WITH RETROGRADE PYELOGRAM, URETEROSCOPY AND STENT PLACEMENT Bilateral 02/26/2013   Procedure: CYSTOSCOPY WITH RETROGRADE PYELOGRAM, URETEROSCOPY AND STENT PLACEMENT;  Surgeon: Alexis Frock, MD;   Location: [facility];  Service: Urology;  Laterality: Bilateral;  . HOLMIUM LASER APPLICATION Bilateral [id number]   Procedure: HOLMIUM LASER APPLICATION;  Surgeon: Alexis Frock, MD;  Location: [facility];  Service: Urology;  Laterality: Bilateral;  . kidney stone removal     06/2011-stent placement  . LEFT MEDIAL FEMORAL CONDYLE DEBRIDEMENT & DRILLING/ REMOVAL LOOSE BODY  09-15-2003  . NASAL SEPTUM SURGERY  1985  . STONE EXTRACTION WITH BASKET  06/12/2011   Procedure: STONE EXTRACTION WITH BASKET;  Surgeon: Claybon Jabs, MD;  [facility];  Service: Urology;  Laterality: Right;    There were no vitals filed for this visit.  Subjective Assessment - 04/18/19 0853    Subjective   I may need to get my shoulder looked at, it's still hurting.  We need to work on cutting meat and opening a bottle.    Patient Stated Goals  improve dexterity in bilateral UE's    Currently in Pain?  Yes    Pain Score  --   4.5   Pain Location  Shoulder    Pain Orientation  Left    Pain Descriptors / Indicators  Aching    Pain Onset  1 to 4 weeks ago    Pain Frequency  Intermittent  Aggravating Factors   shoulder abduction/overhead    Pain Relieving Factors  proper positioning          Opening/closing bottles with use of large amplitude movement strategies with min-mod cueing after instruction in technique.  Pt educated in strategies for cutting food.  Simulated cutting food with fork/knife and pt returned demo of strategies (use steak knife, long cuts/slices, start with knife ahead of food with tip down.  Pt verbalized understanding.    Discussed positioning, good posture, and use of associated trunk movements  to decr L shoulder pain.  Pt verbalized understanding.        OT Education - 04/18/19 1508    Education Details  Red putty HEP for finger ext--see pt instructions; strategies for cutting food and opening containers; Ways to decr risk of future complications/pain     Person(s) Educated  Patient    Methods  Explanation;Demonstration;Handout;Verbal cues    Comprehension  Verbalized understanding;Returned demonstration          OT Long Term Goals - 04/11/19 0831      OT LONG TERM GOAL #1   Title  I with PD specific HEP    Time  5    Period  Weeks    Status  New      OT LONG TERM GOAL #2   Title  Pt will demonstrate improved RUE functional use for ADLs as evidenced by increasing box and blocks score to 44 blocks or greater.    Baseline  RUE 41, LUE 47    Time  5    Period  Weeks    Status  New      OT LONG TERM GOAL #3   Title  Pt will demonstrate improved dexterity for daily activities including fastening buttons as evidenced by decreasing 3 button/ unbutton time to 40 secs or less.    Time  5    Period  Weeks    Status  New      OT LONG TERM GOAL #4   Title  Pt will verbalize understanding of adapted strategies for increased ease and independence wtih ADLS/ IADLs(ie: picking up pills, small objects)    Time  5    Period  Weeks    Status  New      OT LONG TERM GOAL #5   Title  Pt will demonstrate improved ease with self feeding as evidenced by decreasing PPT#2 to 11 secs or less.    Time  5    Period  Weeks    Status  New            Plan - 04/18/19 0906    Clinical Impression Statement  Pt is progressing towards goals.  He reports improved L shoulder pain with improper positioning with reach.  Reviewed ways to prevent/decr future complications.  Pt verbalized understanding of strategies for cutting food and opening containers and updates to HEP.    OT Occupational Profile and History  Problem Focused Assessment - Including review of records relating to presenting problem    Occupational performance deficits (Please refer to evaluation for details):  ADL's;IADL's;Play;Leisure    Body Structure / Function / Physical Skills  ADL;UE functional use;Balance;Flexibility;FMC;ROM;GMC;Coordination;Decreased knowledge of  precautions;IADL;Decreased knowledge of use of DME;Dexterity;Strength;Mobility;Tone;Edema    Rehab Potential  Good    Clinical Decision Making  Limited treatment options, no task modification necessary    Comorbidities Affecting Occupational Performance:  May have comorbidities impacting occupational performance    Modification or Assistance to  Complete Evaluation   No modification of tasks or assist necessary to complete eval    OT Frequency  2x / week    OT Duration  --   5 weeks or 10 visits plus eval over 8 weeks   OT Treatment/Interventions  Self-care/ADL training;Ultrasound;Aquatic Therapy;DME and/or AE instruction;Patient/family education;Balance training;Passive range of motion;Paraffin;Gait Training;Fluidtherapy;Cryotherapy;Moist Heat;Therapeutic exercise;Manual Therapy;Therapeutic activities;Cognitive remediation/compensation;Neuromuscular education    Plan  ADL strategies, coordination activities, bilateral coordination tasks, check on shoulder pain and begin checking goals    Consulted and Agree with Plan of Care  Patient       Patient will benefit from skilled therapeutic intervention in order to improve the following deficits and impairments:   Body Structure / Function / Physical Skills: ADL, UE functional use, Balance, Flexibility, FMC, ROM, GMC, Coordination, Decreased knowledge of precautions, IADL, Decreased knowledge of use of DME, Dexterity, Strength, Mobility, Tone, Edema       Visit Diagnosis: Other symptoms and signs involving the nervous system  Other symptoms and signs involving the musculoskeletal system  Other lack of coordination  Abnormal posture  Other abnormalities of gait and mobility  Unsteadiness on feet    Problem List Patient Active Problem List   Diagnosis Date Noted  . Aortic atherosclerosis ([place]) 06/10/2018  . Controlled type 2 diabetes mellitus with diabetic dermatitis, without long-term current use of insulin ([place]) 01/21/2018  . OSA  treated with BiPAP 01/03/2017  . REM sleep behavior disorder 12/06/2016  . Uncontrolled secondary diabetes mellitus with stage 3 CKD (GFR 30-59) (HCC) 10/23/2016  . Medication management 10/23/2016  . Vitamin D deficiency 10/23/2016  . Parkinson disease ([place]) 01/04/2016  . Seasonal and perennial allergic rhinitis 06/21/2014  . Asthma, mild intermittent, well-controlled 06/21/2014  . Pseudomonas aeruginosa colonization 10/08/2012  . CAD (coronary artery disease) 11/15/2011  . Hypertension 11/15/2011  . Hyperlipidemia 11/15/2011  . Morbid obesity with BMI of 40.0-44.9, adult ([place]) 11/15/2011    [facility] 04/18/2019, 3:09 PM  [facility] [address] [place], [place], [address] Phone: [phone]   Fax:  [phone]  Name: EZANA BLOMME MRN: [id number] Date of Birth: 1955/06/05   Vianne Bulls, OTR/L [facility] [address] [place], [place]  [address] [phone] phone [phone] 04/18/19 3:09 PM

## 2019-04-22 ENCOUNTER — Ambulatory Visit: Payer: 59 | Admitting: Occupational Therapy

## 2019-04-22 ENCOUNTER — Other Ambulatory Visit: Payer: Self-pay

## 2019-04-22 DIAGNOSIS — R2681 Unsteadiness on feet: Secondary | ICD-10-CM

## 2019-04-22 DIAGNOSIS — R278 Other lack of coordination: Secondary | ICD-10-CM

## 2019-04-22 DIAGNOSIS — R293 Abnormal posture: Secondary | ICD-10-CM

## 2019-04-22 DIAGNOSIS — R29818 Other symptoms and signs involving the nervous system: Secondary | ICD-10-CM

## 2019-04-22 DIAGNOSIS — M6281 Muscle weakness (generalized): Secondary | ICD-10-CM

## 2019-04-22 DIAGNOSIS — R2689 Other abnormalities of gait and mobility: Secondary | ICD-10-CM

## 2019-04-22 DIAGNOSIS — R29898 Other symptoms and signs involving the musculoskeletal system: Secondary | ICD-10-CM

## 2019-04-22 NOTE — Therapy (Signed)
[facility] [address] [place], [place], [address] Phone: [phone]   Fax:  [phone]  Occupational Therapy Treatment  Patient Details  Name: Todd Rice MRN: [id number] Date of Birth: [birth year] Referring Provider (OT): Dr. [name]   Encounter Date: 04/22/2019  OT End of Session - 04/22/19 1455    Visit Number  8    Number of Visits  11    Date for OT Re-Evaluation  05/02/19    Authorization Type  UHC    Authorization Time Period  60 VL 6 used    Authorization - Visit Number  8    Authorization - Number of Visits  11    OT Start Time  0721    OT Stop Time  0802    OT Time Calculation (min)  41 min    Activity Tolerance  Patient tolerated treatment well    Behavior During Therapy  [facility] for tasks assessed/performed       Past Medical History:  Diagnosis Date  . Adult BMI 50.0-59.9 kg/sq m    52.77  . Anal fissure   . Anxiety   . Diabetes mellitus without complication (HCC)    borderline  . GERD (gastroesophageal reflux disease)    30 years ago, maybe from peptic ulcer  . H/O: gout    STABLE  . Hepatic steatosis 10/01/11  . History of kidney stones   . Hyperlipidemia   . Hypertension   . Kidney tumor    right upper kidney tumor, monitoring  . OSA on CPAP    [facility]  . Parkinson disease ([place]) 01/04/2016  . REM sleep behavior disorder 12/06/2016  . Right ureteral stone   . Ureteral calculus, right 06/12/2011  . Vitamin D deficiency   . Weakness     Past Surgical History:  Procedure Laterality Date  . ANAL FISSURE REPAIR  10-12-2000  . COLONOSCOPY N/A 07/01/2012   Procedure: COLONOSCOPY;  Surgeon: Lafayette Dragon, MD;  [facility];  Service: Endoscopy;  Laterality: N/A;  . CYSTOSCOPY WITH RETROGRADE PYELOGRAM, URETEROSCOPY AND STENT PLACEMENT Bilateral 02/26/2013   Procedure: CYSTOSCOPY WITH RETROGRADE PYELOGRAM, URETEROSCOPY AND STENT PLACEMENT;  Surgeon: Alexis Frock, MD;   [facility];  Service: Urology;  Laterality: Bilateral;  . HOLMIUM LASER APPLICATION Bilateral 41/74/0814   Procedure: HOLMIUM LASER APPLICATION;  Surgeon: Alexis Frock, MD;  Location: [facility];  Service: Urology;  Laterality: Bilateral;  . kidney stone removal     06/2011-stent placement  . LEFT MEDIAL FEMORAL CONDYLE DEBRIDEMENT & DRILLING/ REMOVAL LOOSE BODY  09-15-2003  . NASAL SEPTUM SURGERY  1985  . STONE EXTRACTION WITH BASKET  06/12/2011   Procedure: STONE EXTRACTION WITH BASKET;  Surgeon: Claybon Jabs, MD;  Location: [facility];  Service: Urology;  Laterality: Right;    There were no vitals filed for this visit.  Subjective Assessment - 04/22/19 0728    Subjective   pt reports pain is better with thumb up.    Patient Stated Goals  improve dexterity in bilateral UE's    Currently in Pain?  Yes    Pain Score  2     Pain Location  Shoulder    Pain Orientation  Left    Pain Descriptors / Indicators  Aching    Pain Type  Acute pain    Pain Onset  1 to 4 weeks ago    Pain Frequency  Intermittent    Aggravating Factors   shoulder abduction/overhead  Pain Relieving Factors  proper positioning       Standing, tossing ball (chest pass) with BUEs for bilateral coordination and min cueing/emphasis on large amplitude, open hands and extending elbows.   Standing, wt. Shift forward/back with tossing beanbags with emphasis/cueing on large amplitude movements, then tossing 2 scarves with therapist with min cueing for incr movement amplitude.  Supine, shoulder flex and diagonals closed chain BUEs with ball for stretch with min cueing for proper positioning followed by PWR! Moves in supine (up, rock, twist) with min cueing for incr movement amplitude.  Checked goals and discussed progress--see goals section below.        OT Long Term Goals - 04/22/19 0742      OT LONG TERM GOAL #1   Title  I with PD specific HEP    Time  5    Period  Weeks    Status   Achieved      OT LONG TERM GOAL #2   Title  Pt will demonstrate improved RUE functional use for ADLs as evidenced by increasing box and blocks score to 44 blocks or greater.    Baseline  RUE 41, LUE 47    Time  5    Period  Weeks    Status  Not Met   04/22/19:  R-40 blocks, L-45blocks     OT LONG TERM GOAL #3   Title  Pt will demonstrate improved dexterity for daily activities including fastening buttons as evidenced by decreasing 3 button/ unbutton time to 40 secs or less.    Time  5    Period  Weeks    Status  Achieved   04/22/19:  34.28sec (but inconsistent)     OT LONG TERM GOAL #4   Title  Pt will verbalize understanding of adapted strategies for increased ease and independence wtih ADLS/ IADLs(ie: picking up pills, small objects)    Time  5    Period  Weeks    Status  Achieved      OT LONG TERM GOAL #5   Title  Pt will demonstrate improved ease with self feeding as evidenced by decreasing PPT#2 to 11 secs or less.    Baseline  14.12sec    Time  5    Period  Weeks    Status  Not Met   04/22/19:  12.56sec           Plan - 04/22/19 1456    Clinical Impression Statement  Pt reports improvement/incr ease with ADLs with use of updates strategies.  Pt also verbalizes understanding of updates to HEP.  Pt also reports improved L shoulder pain with focus on proper positioning.  Pt is appropriate for d/c at this time.    OT Occupational Profile and History  Problem Focused Assessment - Including review of records relating to presenting problem    Occupational performance deficits (Please refer to evaluation for details):  ADL's;IADL's;Play;Leisure    Body Structure / Function / Physical Skills  ADL;UE functional use;Balance;Flexibility;FMC;ROM;GMC;Coordination;Decreased knowledge of precautions;IADL;Decreased knowledge of use of DME;Dexterity;Strength;Mobility;Tone;Edema    Rehab Potential  Good    Clinical Decision Making  Limited treatment options, no task modification  necessary    Comorbidities Affecting Occupational Performance:  May have comorbidities impacting occupational performance    Modification or Assistance to Complete Evaluation   No modification of tasks or assist necessary to complete eval    OT Frequency  2x / week    OT Duration  --   5 weeks  or 10 visits plus eval over 8 weeks   OT Treatment/Interventions  Self-care/ADL training;Ultrasound;Aquatic Therapy;DME and/or AE instruction;Patient/family education;Balance training;Passive range of motion;Paraffin;Gait Training;Fluidtherapy;Cryotherapy;Moist Heat;Therapeutic exercise;Manual Therapy;Therapeutic activities;Cognitive remediation/compensation;Neuromuscular education    Plan  d/c OT, recommend screen in approx 6 months due to progressive nature of diagnosis    Consulted and Agree with Plan of Care  Patient       Patient will benefit from skilled therapeutic intervention in order to improve the following deficits and impairments:   Body Structure / Function / Physical Skills: ADL, UE functional use, Balance, Flexibility, FMC, ROM, GMC, Coordination, Decreased knowledge of precautions, IADL, Decreased knowledge of use of DME, Dexterity, Strength, Mobility, Tone, Edema       Visit Diagnosis: Other symptoms and signs involving the nervous system  Other symptoms and signs involving the musculoskeletal system  Other lack of coordination  Abnormal posture  Other abnormalities of gait and mobility  Unsteadiness on feet  Muscle weakness (generalized)    Problem List Patient Active Problem List   Diagnosis Date Noted  . Aortic atherosclerosis ([place]) 06/10/2018  . Controlled type 2 diabetes mellitus with diabetic dermatitis, without long-term current use of insulin ([place]) 01/21/2018  . OSA treated with BiPAP 01/03/2017  . REM sleep behavior disorder 12/06/2016  . Uncontrolled secondary diabetes mellitus with stage 3 CKD (GFR 30-59) (HCC) 10/23/2016  . Medication management  10/23/2016  . Vitamin D deficiency 10/23/2016  . Parkinson disease ([place]) 01/04/2016  . Seasonal and perennial allergic rhinitis 06/21/2014  . Asthma, mild intermittent, well-controlled 06/21/2014  . Pseudomonas aeruginosa colonization 10/08/2012  . CAD (coronary artery disease) 11/15/2011  . Hypertension 11/15/2011  . Hyperlipidemia 11/15/2011  . Morbid obesity with BMI of 40.0-44.9, adult ([place]) 11/15/2011     OCCUPATIONAL THERAPY DISCHARGE SUMMARY  Visits from Start of Care: 8  Current functional level related to goals / functional outcomes: See above   Remaining deficits: Bradykinesia, rigidity, decr coordination, abnormal posture, decr balance/functional mobility   Education / Equipment: Pt was instructed in the following:  Updates to PD-specific HEP, adaptive strategies for ADLs/IADLs, ways to prevent future complications.  Pt verbalized understanding of all education provided.    Plan: Patient agrees to discharge.  Patient goals were partially met. Patient is being discharged due to being pleased with the current functional level.  Pt would benefit from re-evaluation/occupational therapy screen in approx 6 months to assess for need for further therapy/functional changes due to progressive nature of diagnosis.       [facility] 04/22/2019, 2:59 PM  [facility] [address] [place], [place], [address] Phone: [phone]   Fax:  [phone]  Name: Todd Rice MRN: [id number] Date of Birth: 02/12/1956   Vianne Bulls, OTR/L [facility] [address] [place], [place]  [address] [phone] phone [phone] 04/22/19 2:59 PM

## 2019-04-23 ENCOUNTER — Ambulatory Visit: Payer: 59 | Admitting: Occupational Therapy

## 2019-04-28 ENCOUNTER — Other Ambulatory Visit: Payer: Self-pay | Admitting: Physician Assistant

## 2019-04-28 MED FILL — metFORMIN HCL ER 500 MG TB2: 500 | 30 days supply | Qty: 120 | Fill #10

## 2019-04-28 MED FILL — VALSARTAN-HCTZ 320-25 MG TA: 320-25 | 30 days supply | Qty: 30 | Fill #2

## 2019-04-28 MED FILL — ALLOPURINOL 300 MG TABS: 300 | 30 days supply | Qty: 30 | Fill #11

## 2019-04-29 MED FILL — JANUVIA 100 MG TABLET: 100 | 30 days supply | Qty: 30 | Fill #0

## 2019-05-14 ENCOUNTER — Encounter: Payer: Self-pay | Admitting: Internal Medicine

## 2019-05-14 MED FILL — CARBIDOPA-LEVODOPA 25-100 T: 25-100 | 30 days supply | Qty: 180 | Fill #7

## 2019-05-14 NOTE — Patient Instructions (Signed)

## 2019-05-14 NOTE — Progress Notes (Signed)
History of Present Illness:      This very nice 64 y.o. MWM presents for 3 month follow up with HTN, HLD, T2_NIDDM, OSA/BiPAP, Parkinson's Dz (2018) , and Vitamin D Deficiency. Patient also  has Gout controlled on his Allopurinol / Colchicine.  Patient has an indolent  Rt Renal Cancer followed by Active surveillance since 2014. Patient relates he was dx'd with Parkinson's Dz 4 years ago and that he's been retired for 3 years.       Patient is treated for HTN (1998)  & BP has been controlled at home. Today's BP is at upper limits of Normal - 140/88. Patient has had no complaints of any cardiac type chest pain, palpitations, dyspnea / orthopnea / PND, dizziness, claudication, or dependent edema.      Hyperlipidemia is controlled with diet & meds. Patient denies myalgias or other med SE's. Last Lipids were at goal except elevated Trig's:  Lab Results  Component Value Date   CHOL 177 02/06/2019   HDL 32 (L) 02/06/2019   LDLCALC 98 02/06/2019   TRIG 353 (H) 02/06/2019   CHOLHDL 5.5 (H) 02/06/2019        Also, the patient has Morbid (BMI 42.7+) and consequent T2_NIDDM (2013) w/CKD3  and has had no symptoms of reactive hypoglycemia, diabetic polys, paresthesias or visual blurring. He takes his Glipizide sliding scale at 1/2 tab >150 mg% and 1 whole tab if > 200 mg%.  Last A1c was not at goal:  Lab Results  Component Value Date   HGBA1C 7.2 (H) 02/06/2019        Further, the patient also has history of Vitamin D Deficiency ("18" / 2010 and "28" / 2017)  and supplements vitamin D without any suspected side-effects. Last vitamin D was at goal:  Lab Results  Component Value Date   VD25OH 72 02/06/2019    Current Outpatient Medications on File Prior to Visit  Medication Sig  . allopurinol (ZYLOPRIM) 300 MG tablet TAKE 1 TABLET BY MOUTH ONCE DAILY  . ALPRAZolam (XANAX) 0.5 MG tablet Take 1 tablet (0.5 mg total) by mouth 3 (three) times daily.  Marland Kitchen aspirin EC 81 MG tablet Take 81 mg by  mouth at bedtime.  . carbidopa-levodopa (SINEMET IR) 25-100 MG tablet Take 2 tablets by mouth 3 (three) times daily.  Marland Kitchen glipiZIDE (GLUCOTROL) 5 MG tablet Take 1 tablet (5 mg total) by mouth 2 (two) times daily before a meal.  . metFORMIN (GLUCOPHAGE-XR) 500 MG 24 hr tablet TAKE 2 TABLETS BY MOUTH 2 TIMES A DAY FOR DIABETES  . pramipexole (MIRAPEX) 1 MG tablet Take 1 tablet (1 mg total) by mouth 3 (three) times daily.  . rosuvastatin (CRESTOR) 40 MG tablet Take 1 tablet (40 mg total) by mouth daily.  . sitaGLIPtin (JANUVIA) 100 MG tablet Take 1 tablet Daily with a Meal for Diabetes  . valsartan-hydrochlorothiazide (DIOVAN-HCT) 320-25 MG tablet Take 1 tablet Daily for BP  . VENTOLIN HFA 108 (90 Base) MCG/ACT inhaler INHALE 2 PUFFS INTO THE LUNGS EVERY 6 HOURS AS NEEDED FOR WHEEZING OR SHORTNESS OF BREATH.  . vitamin C (ASCORBIC ACID) 500 MG tablet Take 1 tablet (500 mg total) by mouth 3 (three) times daily.  Marland Kitchen VITAMIN D PO Take 5,000 Units by mouth 2 (two) times daily.  Marland Kitchen buPROPion (WELLBUTRIN XL) 150 MG 24 hr tablet Take 1 tablet (150 mg total) by mouth every morning.   No current facility-administered medications on file prior to visit.  Allergies  Allergen Reactions  . Ciprofloxacin Hives  . Ceftriaxone Hives    PMHx:   Past Medical History:  Diagnosis Date  . Adult BMI 50.0-59.9 kg/sq m    52.77  . Anal fissure   . Anxiety   . Diabetes mellitus without complication (HCC)    borderline  . GERD (gastroesophageal reflux disease)    30 years ago, maybe from peptic ulcer  . H/O: gout    STABLE  . Hepatic steatosis 10/01/11  . History of kidney stones   . Hyperlipidemia   . Hypertension   . Kidney tumor    right upper kidney tumor, monitoring  . OSA on CPAP    [facility]  . Parkinson disease ([place]) 01/04/2016  . REM sleep behavior disorder 12/06/2016  . Right ureteral stone   . Ureteral calculus, right 06/12/2011  . Vitamin D deficiency   . Weakness    Immunization History    Administered Date(s) Administered  . Influenza Inj Mdck Quad With Preservative 02/22/2017, 01/21/2018, 02/06/2019  . Influenza-Unspecified 02/03/2013, 02/05/2014  . Pneumococcal-Unspecified 11/05/2012  . Tdap 03/26/2015   Past Surgical History:  Procedure Laterality Date  . ANAL FISSURE REPAIR  10-12-2000  . COLONOSCOPY N/A 07/01/2012   Procedure: COLONOSCOPY;  Surgeon: Lafayette Dragon, MD;  Location: [facility];  Service: Endoscopy;  Laterality: N/A;  . CYSTOSCOPY WITH RETROGRADE PYELOGRAM, URETEROSCOPY AND STENT PLACEMENT Bilateral 02/26/2013   Procedure: CYSTOSCOPY WITH RETROGRADE PYELOGRAM, URETEROSCOPY AND STENT PLACEMENT;  Surgeon: Alexis Frock, MD;  Location: [facility];  Service: Urology;  Laterality: Bilateral;  . HOLMIUM LASER APPLICATION Bilateral [id number]   Procedure: HOLMIUM LASER APPLICATION;  Surgeon: Alexis Frock, MD;  Location: [facility];  Service: Urology;  Laterality: Bilateral;  . kidney stone removal     06/2011-stent placement  . LEFT MEDIAL FEMORAL CONDYLE DEBRIDEMENT & DRILLING/ REMOVAL LOOSE BODY  09-15-2003  . NASAL SEPTUM SURGERY  1985  . STONE EXTRACTION WITH BASKET  06/12/2011   Procedure: STONE EXTRACTION WITH BASKET;  Surgeon: Claybon Jabs, MD;  Location: [facility];  Service: Urology;  Laterality: Right;    FHx:    Reviewed / unchanged  SHx:    Reviewed / unchanged   Systems Review:  Constitutional: Denies fever, chills, wt changes, headaches, insomnia, fatigue, night sweats, change in appetite. Eyes: Denies redness, blurred vision, diplopia, discharge, itchy, watery eyes.  ENT: Denies discharge, congestion, post nasal drip, epistaxis, sore throat, earache, hearing loss, dental pain, tinnitus, vertigo, sinus pain, snoring.  CV: Denies chest pain, palpitations, irregular heartbeat, syncope, dyspnea, diaphoresis, orthopnea, PND, claudication or edema. Respiratory: denies cough, dyspnea, DOE, pleurisy, hoarseness, laryngitis, wheezing.   Gastrointestinal: Denies dysphagia, odynophagia, heartburn, reflux, water brash, abdominal pain or cramps, nausea, vomiting, bloating, diarrhea, constipation, hematemesis, melena, hematochezia  or hemorrhoids. Genitourinary: Denies dysuria, frequency, urgency, nocturia, hesitancy, discharge, hematuria or flank pain. Musculoskeletal: Denies arthralgias, myalgias, stiffness, jt. swelling, pain, limping or strain/sprain.  Skin: Denies pruritus, rash, hives, warts, acne, eczema or change in skin lesion(s). Neuro: No weakness, tremor, incoordination, spasms, paresthesia or pain. Psychiatric: Denies confusion, memory loss or sensory loss. Endo: Denies change in weight, skin or hair change.  Heme/Lymph: No excessive bleeding, bruising or enlarged lymph nodes.  Physical Exam  BP 140/88   Pulse 84   Temp (!) 97 F (36.1 C)   Resp 16   Ht 5\' 7"  (1.702 m)   Wt 281 lb (127.5 kg)   BMI 44.01 kg/m   Appears  well nourished,  well groomed  and in no distress.  Eyes: PERRLA, EOMs, conjunctiva no swelling or erythema. Sinuses: No frontal/maxillary tenderness ENT/Mouth: EAC's clear, TM's nl w/o erythema, bulging. Nares clear w/o erythema, swelling, exudates. Oropharynx clear without erythema or exudates. Oral hygiene is good. Tongue normal, non obstructing. Hearing intact.  Neck: Supple. Thyroid not palpable. Car 2+/2+ without bruits, nodes or JVD. Chest: Respirations nl with BS clear & equal w/o rales, rhonchi, wheezing or stridor.  Cor: Heart sounds normal w/ regular rate and rhythm without sig. murmurs, gallops, clicks or rubs. Peripheral pulses normal and equal  without edema.  Abdomen: Soft & bowel sounds normal. Non-tender w/o guarding, rebound, hernias, masses or organomegaly.  Lymphatics: Unremarkable.  Musculoskeletal: Full ROM all peripheral extremities, joint stability, 5/5 strength and normal gait with decreased arm swing. No cog wheeling.  Skin: Warm, dry without exposed rashes, lesions  or ecchymosis apparent.  Neuro: Cranial nerves intact, reflexes equal bilaterally. Sensory-motor testing grossly intact. Tendon reflexes grossly intact. No tremor. Pysch: Alert & oriented x 3.  Insight and judgement nl & appropriate. No ideations.  Assessment and Plan:  1. Essential hypertension  - Continue medication, monitor blood pressure at home.  - Continue DASH diet.  Reminder to go to the ER if any CP,  SOB, nausea, dizziness, severe HA, changes vision/speech.  - CBC with Diff - COMPLETE METABOLIC PANEL WITH GFR - Magnesium - TSH  2. Hyperlipidemia associated with type 2 diabetes mellitus ([place])  - Continue diet/meds, exercise,& lifestyle modifications.  - Continue monitor periodic cholesterol/liver & renal functions   - Lipid Profile - TSH  3. Type 2 diabetes mellitus with stage 3a chronic kidney disease,  without long-term current use of insulin (HCC)  - Continue diet, exercise  - Lifestyle modifications.  - Monitor appropriate labs.  - Hemoglobin A1c ([facility]) - Insulin, random  4. Gout  - Uric acid  5. OSA treated with BiPAP   6. Vitamin D deficiency  - Continue supplementation.   7. Parkinson disease ([place])   8. Medication management  - CBC with Diff - COMPLETE METABOLIC PANEL WITH GFR - Magnesium - Lipid Profile - TSH - Hemoglobin A1c ([facility]) - Insulin, random - Uric acid        Discussed  regular exercise, BP monitoring, weight control to achieve/maintain BMI less than 25 and discussed med and SE's. Recommended labs to assess and monitor clinical status with further disposition pending results of labs.  I discussed the assessment and treatment plan with the patient. The patient was provided an opportunity to ask questions and all were answered. The patient agreed with the plan and demonstrated an understanding of the instructions.  I provided over 30 minutes of exam, counseling, chart review and  complex critical decision making.  Kirtland Bouchard, MD

## 2019-05-15 ENCOUNTER — Other Ambulatory Visit: Payer: Self-pay

## 2019-05-15 ENCOUNTER — Ambulatory Visit (INDEPENDENT_AMBULATORY_CARE_PROVIDER_SITE_OTHER): Payer: Medicare Other | Admitting: Internal Medicine

## 2019-05-15 ENCOUNTER — Ambulatory Visit: Payer: 59 | Admitting: Internal Medicine

## 2019-05-15 VITALS — BP 140/88 | HR 84 | Temp 97.0°F | Resp 16 | Ht 67.0 in | Wt 281.0 lb

## 2019-05-15 DIAGNOSIS — N1831 Chronic kidney disease, stage 3a: Secondary | ICD-10-CM | POA: Diagnosis not present

## 2019-05-15 DIAGNOSIS — E1169 Type 2 diabetes mellitus with other specified complication: Secondary | ICD-10-CM | POA: Diagnosis not present

## 2019-05-15 DIAGNOSIS — E559 Vitamin D deficiency, unspecified: Secondary | ICD-10-CM

## 2019-05-15 DIAGNOSIS — E1121 Type 2 diabetes mellitus with diabetic nephropathy: Secondary | ICD-10-CM

## 2019-05-15 DIAGNOSIS — I10 Essential (primary) hypertension: Secondary | ICD-10-CM

## 2019-05-15 DIAGNOSIS — G4733 Obstructive sleep apnea (adult) (pediatric): Secondary | ICD-10-CM | POA: Diagnosis not present

## 2019-05-15 DIAGNOSIS — M109 Gout, unspecified: Secondary | ICD-10-CM

## 2019-05-15 DIAGNOSIS — G20 Parkinson's disease: Secondary | ICD-10-CM

## 2019-05-15 DIAGNOSIS — Z79899 Other long term (current) drug therapy: Secondary | ICD-10-CM

## 2019-05-15 DIAGNOSIS — E785 Hyperlipidemia, unspecified: Secondary | ICD-10-CM | POA: Diagnosis not present

## 2019-05-15 DIAGNOSIS — E1122 Type 2 diabetes mellitus with diabetic chronic kidney disease: Secondary | ICD-10-CM

## 2019-05-16 ENCOUNTER — Ambulatory Visit: Payer: 59 | Admitting: Internal Medicine

## 2019-05-16 LAB — CBC WITH DIFFERENTIAL/PLATELET
Absolute Monocytes: 648 cells/uL (ref 200–950)
Basophils Absolute: 36 cells/uL (ref 0–200)
Basophils Relative: 0.4 %
Eosinophils Absolute: 153 cells/uL (ref 15–500)
Eosinophils Relative: 1.7 %
HCT: 46 % (ref 38.5–50.0)
Hemoglobin: 15.6 g/dL (ref 13.2–17.1)
Lymphs Abs: 2169 cells/uL (ref 850–3900)
MCH: 28.5 pg (ref 27.0–33.0)
MCHC: 33.9 g/dL (ref 32.0–36.0)
MCV: 83.9 fL (ref 80.0–100.0)
MPV: 10.4 fL (ref 7.5–12.5)
Monocytes Relative: 7.2 %
Neutro Abs: 5994 cells/uL (ref 1500–7800)
Neutrophils Relative %: 66.6 %
Platelets: 198 10*3/uL (ref 140–400)
RBC: 5.48 10*6/uL (ref 4.20–5.80)
RDW: 14.1 % (ref 11.0–15.0)
Total Lymphocyte: 24.1 %
WBC: 9 10*3/uL (ref 3.8–10.8)

## 2019-05-16 LAB — TSH: TSH: 3.96 mIU/L (ref 0.40–4.50)

## 2019-05-16 LAB — COMPLETE METABOLIC PANEL WITH GFR
AG Ratio: 1.2 (calc) (ref 1.0–2.5)
ALT: 18 U/L (ref 9–46)
AST: 21 U/L (ref 10–35)
Albumin: 3.9 g/dL (ref 3.6–5.1)
Alkaline phosphatase (APISO): 50 U/L (ref 35–144)
BUN: 20 mg/dL (ref 7–25)
CO2: 28 mmol/L (ref 20–32)
Calcium: 10.2 mg/dL (ref 8.6–10.3)
Chloride: 98 mmol/L (ref 98–110)
Creat: 1.15 mg/dL (ref 0.70–1.25)
GFR, Est African American: 78 mL/min/{1.73_m2} (ref >=60)
GFR, Est Non African American: 67 mL/min/{1.73_m2} (ref >=60)
Globulin: 3.2 g/dL (calc) (ref 1.9–3.7)
Glucose, Bld: 215 mg/dL — ABNORMAL HIGH (ref 65–99)
Potassium: 4 mmol/L (ref 3.5–5.3)
Sodium: 136 mmol/L (ref 135–146)
Total Bilirubin: 0.4 mg/dL (ref 0.2–1.2)
Total Protein: 7.1 g/dL (ref 6.1–8.1)

## 2019-05-16 LAB — URIC ACID: Uric Acid, Serum: 5.7 mg/dL (ref 4.0–8.0)

## 2019-05-16 LAB — LIPID PANEL
Cholesterol: 178 mg/dL (ref <200)
HDL: 28 mg/dL — ABNORMAL LOW (ref >=40)
Non-HDL Cholesterol (Calc): 150 mg/dL (calc) — ABNORMAL HIGH (ref <130)
Total CHOL/HDL Ratio: 6.4 (calc) — ABNORMAL HIGH (ref <5.0)
Triglycerides: 472 mg/dL — ABNORMAL HIGH (ref <150)

## 2019-05-16 LAB — HEMOGLOBIN A1C
Hgb A1c MFr Bld: 7.1 % of total Hgb — ABNORMAL HIGH (ref <5.7)
Mean Plasma Glucose: 157 (calc)
eAG (mmol/L): 8.7 (calc)

## 2019-05-16 LAB — INSULIN, RANDOM: Insulin: 109.7 u[IU]/mL — ABNORMAL HIGH

## 2019-05-16 LAB — MAGNESIUM: Magnesium: 1.7 mg/dL (ref 1.5–2.5)

## 2019-05-28 ENCOUNTER — Other Ambulatory Visit: Payer: Self-pay | Admitting: Internal Medicine

## 2019-05-28 MED FILL — ALLOPURINOL 300 MG TABS: 300 | 30 days supply | Qty: 30 | Fill #0

## 2019-05-28 MED FILL — VALSARTAN-HCTZ 320-25 MG TA: 320-25 | 30 days supply | Qty: 30 | Fill #3

## 2019-05-28 MED FILL — metFORMIN HCL ER 500 MG TB2: 500 | 30 days supply | Qty: 120 | Fill #11

## 2019-05-28 MED FILL — glipiZIDE 5 MG TABS: 5 | 30 days supply | Qty: 60 | Fill #1

## 2019-06-06 ENCOUNTER — Ambulatory Visit: Payer: Self-pay

## 2019-06-13 MED FILL — CARBIDOPA-LEVODOPA 25-100 T: 25-100 | 30 days supply | Qty: 180 | Fill #8

## 2019-06-14 ENCOUNTER — Ambulatory Visit: Payer: Medicare Other | Attending: Internal Medicine

## 2019-06-14 DIAGNOSIS — Z23 Encounter for immunization: Secondary | ICD-10-CM | POA: Insufficient documentation

## 2019-06-14 NOTE — Progress Notes (Signed)
   Covid-19 Vaccination Clinic  Name:  Todd Rice    MRN: [id number] DOB: 02/28/1956  06/14/2019  Todd Rice was observed post Covid-19 immunization for 15 minutes without incidence. He was provided with Vaccine Information Sheet and instruction to access the V-Safe system.   Todd Rice was instructed to call 911 with any severe reactions post vaccine: Marland Kitchen Difficulty breathing  . Swelling of your face and throat  . A fast heartbeat  . A bad rash all over your body  . Dizziness and weakness    Immunizations Administered    Name Date Dose VIS Date Route   Pfizer COVID-19 Vaccine 06/14/2019  1:47 PM 0.3 mL 04/18/2019 Intramuscular   Manufacturer: [place]   Lot: [id number]   [place]: [id number]

## 2019-06-27 ENCOUNTER — Other Ambulatory Visit: Payer: Self-pay | Admitting: Internal Medicine

## 2019-06-27 DIAGNOSIS — E119 Type 2 diabetes mellitus without complications: Secondary | ICD-10-CM

## 2019-06-27 MED FILL — ALLOPURINOL 300 MG TABS: 300 | 30 days supply | Qty: 30 | Fill #1

## 2019-06-27 MED FILL — VALSARTAN-HCTZ 320-25 MG TA: 320-25 | 30 days supply | Qty: 30 | Fill #4

## 2019-06-27 MED FILL — metFORMIN HCL ER 500 MG TB2: 500 | 90 days supply | Qty: 360 | Fill #0

## 2019-07-09 ENCOUNTER — Ambulatory Visit: Payer: Medicare Other | Attending: Internal Medicine

## 2019-07-09 ENCOUNTER — Other Ambulatory Visit: Payer: Self-pay | Admitting: Neurology

## 2019-07-09 DIAGNOSIS — Z23 Encounter for immunization: Secondary | ICD-10-CM | POA: Insufficient documentation

## 2019-07-09 DIAGNOSIS — E1169 Type 2 diabetes mellitus with other specified complication: Secondary | ICD-10-CM | POA: Insufficient documentation

## 2019-07-09 MED FILL — CARBIDOPA-LEVODOPA 25-100 T: 25-100 | 30 days supply | Qty: 180 | Fill #0

## 2019-07-09 NOTE — Progress Notes (Signed)
   Covid-19 Vaccination Clinic  Name:  Todd Rice    MRN: [id number] DOB: 04-11-1956  07/09/2019  [name] was observed post Covid-19 immunization for 15 minutes without incident. He was provided with Vaccine Information Sheet and instruction to access the V-Safe system.   [name] was instructed to call 911 with any severe reactions post vaccine: Marland Kitchen Difficulty breathing  . Swelling of face and throat  . A fast heartbeat  . A bad rash all over body  . Dizziness and weakness   Immunizations Administered    Name Date Dose VIS Date Route   Pfizer COVID-19 Vaccine 07/09/2019  9:00 AM 0.3 mL 04/18/2019 Intramuscular   Manufacturer: [place]   Lot: [id number]   [place]: [id number]

## 2019-07-09 NOTE — Progress Notes (Signed)
WELCOME TO MEDICARE AND ACUTE VISIT  Assessment and Plan:  Medicare wellness 1 year  Coronary artery disease involving native coronary artery of native heart without angina pectoris Control blood pressure, cholesterol, glucose, increase exercise.  Follow up cardiology Go to the ER if any chest pain, shortness of breath, nausea, dizziness, severe HA, changes vision/speech  Essential hypertension - continue medications, DASH diet, exercise and monitor at home. Call if greater than 130/80.  Screen AAA  Type 2 diabetes mellitus without complication, without long-term current use of insulin ([place]) Discussed general issues about diabetes pathophysiology and management., Educational material distributed., Suggested low cholesterol diet., Encouraged aerobic exercise., Discussed foot care., Reminded to get yearly retinal exam.  Parkinson disease ([place]) Continue neuro follow up and meds  Hyperlipidemia, unspecified hyperlipidemia type -continue medications, check lipids, decrease fatty foods, increase activity.   Class 3 obesity due to excess calories with serious comorbidity and body mass index (BMI) of 45.0 to 49.9 in adult [facility]) - long discussion about weight loss, diet, and exercise  Medication management -     Magnesium  Vitamin D deficiency -     VITAMIN D 25 Hydroxy (Vit-D Deficiency, Fractures)  Seasonal and perennial allergic rhinitis - Allegra OTC, increase H20, allergy hygiene explained.  Asthma, mild intermittent, well-controlled Continue weight loss   Pseudomonas aeruginosa colonization Monitor  REM sleep behavior disorder continue neuro follow up  OSA treated with BiPAP Sleep apnea- continue CPAP, CPAP is helping with daytime fatigue, weight loss still advised.  Having night time hallucinations with acting out- REM sleep DO Has follow up with Dr. Brett Fairy- mention to her- ? From parkinson's versus rule out hypoxia at night  Uncontrolled secondary diabetes mellitus  with stage 3 CKD (GFR 30-59) ([facility]) Discussed general issues about diabetes pathophysiology and management., Educational material distributed., Suggested low cholesterol diet., Encouraged aerobic exercise., Discussed foot care., Reminded to get yearly retinal exam.  Controlled type 2 diabetes mellitus with diabetic dermatitis, without long-term current use of insulin ([place]) Discussed general issues about diabetes pathophysiology and management., Educational material distributed., Suggested low cholesterol diet., Encouraged aerobic exercise., Discussed foot care., Reminded to get yearly retinal exam.  Depression Dicussed counseling, increase exercise  Left shoulder pain x 3 months Will set up for PT and refer to ortho Likely impingement/tendonitis RICE, voltern gel, exercises given  Advanced care planning  Discussed advanced care planning with patient and wife At least 30 mins discussed with the patient and/or family at the visit. Given papers to both to fill out   Discussed med's effects and SE's. Screening labs and tests as requested with regular follow-up as recommended. Future Appointments  Date Time Provider [place]  07/16/2019 10:15 AM Mcarthur Rossetti, MD [facility] None  07/30/2019  1:30 PM Dohmeier, Asencion Partridge, MD [facility] None  08/13/2019  9:30 AM Vicie Mutters, PA-C [facility] None  09/15/2019  7:30 AM Kathrynn Ducking, MD [facility] None  11/12/2019  9:30 AM Vicie Mutters, PA-C [facility] None  02/17/2020  9:00 AM Vicie Mutters, PA-C [facility] None  07/15/2020 10:00 AM Vicie Mutters, PA-C [facility] None    HPI Patient presents for a welcome to medicare visit.   Right handed pain, complains of left shoulder pain x Dec, said he was doing parkinson exercises when he felt a pop and had immediate pain. No pain with rest, just pain with abduction with pain into this biceps. Has not tried much for it, has taken tylenol and rubbed biofreeze on it.  Has seen Dr. Latanya Maudlin for  his knee in the past.   His blood pressure has been controlled at home, today their BP is BP: 136/80 He does workout Normal stress test 2013.Marland Kitchen He denies chest pain, shortness of breath, dizziness.  BMI is Body mass index is 43.7 kg/m., he is working on diet and exercise. He is on BiPAP for OSA. He is doing well with weight loss.  Wt Readings from Last 3 Encounters:  07/10/19 279 lb (126.6 kg)  05/15/19 281 lb (127.5 kg)  03/18/19 281 lb 5 oz (127.6 kg)   He had MRCP with Dr. Ardis Hughs for pancreatic cyst in May 2020, and will have repeat in 2 years. He is following with nephrology for possible RCC, it shrunk on its own- continuing to monitor.   Patient reports that he has been following with neurology for parkinson. He reports that he has been taking the mirapex and sinemet but has noticed a change in his left hand with worsening tremors.   He is on miralax once a day for constipation and has added dave's killer bread that helps. States he has had hallucinations for several years with waking up from a sleep only, will wake up and feel that someone is in the room, has "seen the wicked witch of the [place] sleeping next to him", will act out in the dream, will see things, has been going on for years, neurologist knows about it. He wears his biPAP nightly. Has appointment with Dr. Roxy Horseman on the 24th.   He is on cholesterol medication and denies myalgias. His cholesterol is not at goal of 70 or less. The cholesterol last visit was:   Lab Results  Component Value Date   CHOL 178 05/15/2019   HDL 28 (L) 05/15/2019   [place]  05/15/2019     Comment:     . LDL cholesterol not calculated. Triglyceride levels greater than 400 mg/dL invalidate calculated LDL results. . Reference range: <100 . Desirable range <100 mg/dL for primary prevention;   <70 mg/dL for patients with CHD or diabetic patients  with > or = 2 CHD risk factors. Marland Kitchen LDL-C is now calculated using the Martin-Hopkins  calculation,  which is a validated novel method providing  better accuracy than the Friedewald equation in the  estimation of LDL-C.  Cresenciano Genre et al. Annamaria Helling. [id number]): 2061-2068  (http://education.[employer].com/faq/FAQ164)    TRIG 472 (H) 05/15/2019   CHOLHDL 6.4 (H) 05/15/2019   He has been working on diet and exercise for diabetes CKD, he is on ARB Hyperlipemia on crestor Aortic atherosclerosis he is on bASA he is on metformin 4 a day  [place] he is off due to cost  takes glipizide if his sugar is above 150-200 will take 1/2, if above 200 will take 1 pill His sugars have been running 130-140, lowest is 88, highest is 180.  Has hypoglycemic awareness Eye Exam April 2020 Dr. Sherral Hammers, [facility]- will get papers  denies foot ulcerations, hyperglycemia, hypoglycemia , increased appetite, nausea, paresthesia of the feet, polydipsia, polyuria, visual disturbances, vomiting and weight loss.  Last A1C in the office was:  Lab Results  Component Value Date   HGBA1C 7.1 (H) 05/15/2019   Lab Results  Component Value Date   GFRNONAA 67 05/15/2019   Patient is on Vitamin D supplement.   Lab Results  Component Value Date   VD25OH 72 02/06/2019   Last PSA was, checks with Dr. Tresa Moore.  Lab Results  Component Value Date   PSA 3.7 01/21/2018  Current Medications:   Current Outpatient Medications (Endocrine & Metabolic):  .  glipiZIDE (GLUCOTROL) 5 MG tablet, Take 1 tablet (5 mg total) by mouth 2 (two) times daily before a meal. .  metFORMIN (GLUCOPHAGE-XR) 500 MG 24 hr tablet, TAKE 2 TABLETS 2 TIMES A DAY FOR DIABETES  Current Outpatient Medications (Cardiovascular):  .  rosuvastatin (CRESTOR) 40 MG tablet, Take 1 tablet (40 mg total) by mouth daily. .  valsartan-hydrochlorothiazide (DIOVAN-HCT) 320-25 MG tablet, Take 1 tablet Daily for BP  Current Outpatient Medications (Respiratory):  Marland Kitchen  VENTOLIN HFA 108 (90 Base) MCG/ACT inhaler, INHALE 2 PUFFS INTO THE LUNGS EVERY 6 HOURS AS  NEEDED FOR WHEEZING OR SHORTNESS OF BREATH.  Current Outpatient Medications (Analgesics):  .  allopurinol (ZYLOPRIM) 300 MG tablet, TAKE 1 TABLET BY MOUTH ONCE DAILY .  aspirin EC 81 MG tablet, Take 81 mg by mouth at bedtime.   Current Outpatient Medications (Other):  Marland Kitchen  ALPRAZolam (XANAX) 0.5 MG tablet, Take 1 tablet (0.5 mg total) by mouth 3 (three) times daily. .  carbidopa-levodopa (SINEMET IR) 25-100 MG tablet, TAKE 2 TABLETS BY MOUTH THREE TIMES DAILY .  pramipexole (MIRAPEX) 1 MG tablet, Take 1 tablet (1 mg total) by mouth 3 (three) times daily. .  vitamin C (ASCORBIC ACID) 500 MG tablet, Take 1 tablet (500 mg total) by mouth 3 (three) times daily. Marland Kitchen  VITAMIN D PO, Take 5,000 Units by mouth 2 (two) times daily. Marland Kitchen  buPROPion (WELLBUTRIN XL) 150 MG 24 hr tablet, Take 1 tablet (150 mg total) by mouth every morning.  Health Maintenance:  Immunization History  Administered Date(s) Administered  . Influenza Inj Mdck Quad With Preservative 02/22/2017, 01/21/2018, 02/06/2019  . Influenza-Unspecified 02/03/2013, 02/05/2014  . PFIZER SARS-COV-2 Vaccination 06/14/2019, 07/09/2019  . Pneumococcal-Unspecified 11/05/2012  . Tdap 03/26/2015   Tetanus: 2016 Pneumovax: 2014 Flu vaccine: 2020  Colonoscopy: 2014 due 10 years Eye Exam: Dr. Clydene Laming April 2020- get paper Sleep study 2017 CTA chest 2014 CXR 06/2018 MRI brain 2017 Stress test 2013 MRCP 09/2018 IMPRESSION: 1.4 cm simple appearing cystic lesion in pancreatic body, mildly increased in since prior studies dating back to 2015. Smaller cystic lesions in the pancreatic body and tail appear to have been present on prior MRI although less well visualized due to their small size. 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 Consider continued follow-up by MRI in 1 year or endoscopic ultrasound with FNA. This recommendation follows ACR consensus guidelines: Management of Incidental Pancreatic  Cysts: A White Paper of the ACR Incidental Findings Committee. [place] [id number].  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  No evidence of abdominal metastatic disease.  Moderate hepatic steatosis.  Cholelithiasis.  No radiographic evidence of cholecystitis.   [name], [name] Keene Breath., [name], [name], MD (Inactive) as Consulting Physician (Gastroenterology) Rolene Course, PA-C as Physician Assistant (Emergency Medicine)   Medical History:  Past Medical History:  Diagnosis Date  . Adult BMI 50.0-59.9 kg/sq m    52.77  . Anal fissure   . Anxiety   . Diabetes mellitus without complication ([facility])    borderline  . GERD (gastroesophageal reflux disease)    30 years ago, maybe from peptic ulcer  . H/O: gout    STABLE  . Hepatic steatosis 10/01/11  . History of kidney stones   .  Hyperlipidemia   . Hypertension   . Kidney tumor    right upper kidney tumor, monitoring  . OSA on CPAP    [facility]  . Parkinson disease ([place]) 01/04/2016  . REM sleep behavior disorder 12/06/2016  . Right ureteral stone   . Ureteral calculus, right 06/12/2011  . Vitamin D deficiency   . Weakness    Allergies Allergies  Allergen Reactions  . Ciprofloxacin Hives  . Ceftriaxone Hives    SURGICAL HISTORY He  has a past surgical history that includes Anal fissure repair (10-12-2000); Nasal septum surgery (1985); LEFT MEDIAL FEMORAL CONDYLE DEBRIDEMENT & DRILLING/ REMOVAL LOOSE BODY (09-15-2003); Stone extraction with basket (06/12/2011); kidney stone removal; Colonoscopy (N/A, 07/01/2012); Cystoscopy with retrograde pyelogram, ureteroscopy and stent placement (Bilateral, 02/26/2013); and Holmium laser application (Bilateral, 02/26/2013). FAMILY HISTORY His family history includes Cancer in his father;  Heart disease in his maternal aunt, mother, and paternal grandmother; Hyperlipidemia in his brother, maternal aunt, mother, and paternal grandmother; Hypertension in his maternal aunt and mother; Lung cancer in his father; Stroke in his maternal aunt. SOCIAL HISTORY He  reports that he has never smoked. He has never used smokeless tobacco. He reports that he does not drink alcohol or use drugs.  MEDICARE WELLNESS OBJECTIVES: Physical activity: Current Exercise Habits: Structured exercise class, Type of exercise: walking(parkinson's rehab), Intensity: Mild Cardiac risk factors: Cardiac Risk Factors include: advanced age (>86men, >58 women);diabetes mellitus;dyslipidemia;hypertension;male gender;obesity (BMI >30kg/m2);sedentary lifestyle Depression/mood screen:   Depression screen [facility] 2/9 07/10/2019  Decreased Interest 0  Down, Depressed, Hopeless 1  PHQ - 2 Score 1  Altered sleeping 1  Tired, decreased energy 1  Change in appetite 0  Feeling bad or failure about yourself  0  Trouble concentrating 0  Moving slowly or fidgety/restless 0  Suicidal thoughts 0  PHQ-9 Score 3  Difficult doing work/chores Not difficult at all  Some recent data might be hidden    ADLs:  In your present state of health, do you have any difficulty performing the following activities: 07/10/2019 05/14/2019  Hearing? N N  Vision? N N  Difficulty concentrating or making decisions? Y N  Walking or climbing stairs? N N  Comment used hand rail -  Dressing or bathing? Y N  Comment can not do buttons anymore with parkinson's but otherwise normal -  Doing errands, shopping? N N  Some recent data might be hidden     Cognitive Testing  Alert? Yes  Normal Appearance?Yes  Oriented to person? Yes  Place? Yes   Time? Yes  Recall of three objects?  Yes  Can perform simple calculations? Yes  Displays appropriate judgment?Yes  Can read the correct time from a watch face?Yes  EOL planning: Does Patient Have a Medical  Advance Directive?: No Would patient like information on creating a medical advance directive?: Yes (MAU/Ambulatory/Procedural Areas - Information given)    Review of Systems:  Review of Systems  Constitutional: Positive for malaise/fatigue. Negative for chills, diaphoresis, fever and weight loss.  HENT: Negative for congestion, ear pain, hearing loss and sore throat.   Eyes: Negative.   Respiratory: Negative for cough, shortness of breath and wheezing.   Cardiovascular: Negative for chest pain, palpitations, orthopnea, claudication, leg swelling and PND.  Gastrointestinal: Negative for blood in stool, constipation, diarrhea, heartburn and melena.  Genitourinary: Negative.   Musculoskeletal: Positive for joint pain.  Skin: Negative.   Neurological: Positive for sensory change and focal weakness. Negative for dizziness, loss of consciousness and headaches.  Psychiatric/Behavioral:  Negative for depression and memory loss. The patient is not nervous/anxious and does not have insomnia.     Physical Exam: Estimated body mass index is 43.7 kg/m as calculated from the following:   Height as of 05/15/19: 5\' 7"  (1.702 m).   Weight as of this encounter: 279 lb (126.6 kg). BP 136/80   Pulse 63   Temp 97.7 F (36.5 C)   Wt 279 lb (126.6 kg)   SpO2 97%   BMI 43.70 kg/m   General Appearance: Well nourished, in no apparent distress.  Eyes: PERRLA, EOMs, conjunctiva no swelling or erythema ENT/Mouth: Ear canals clear bilaterally with no erythema, swelling, discharge.  TMs normal bilaterally with no erythema, bulging, or retractions. Mouth and nose not examined- patient wearing a facemask  Neck: Supple, thyroid normal. No bruits, JVD, cervical adenopathy Respiratory: Respiratory effort normal, BS equal bilaterally without rales, rhonchi, wheezing or stridor.  Cardio: RRR without murmurs, rubs or gallops. Brisk peripheral pulses without edema.  Chest: symmetric, with normal excursions Abdomen:  Soft, nontender, no guarding, rebound, hernias, masses, or organomegaly. Genitourinary: defer Musculoskeletal: Full ROM all peripheral extremities,Shoulder left: decreased abduction to 100 degrees with pain, + empty can test.. Neurovascularly intact distally. Good strength with stress of rotator cuff but causes pain. Positive impingement signs. Skin: Warm, dry without rashes, lesions, ecchymosis. Neuro: A&Ox3, Cranial nerves intact, slow gait.  Tremor of the left hand.    Psych: Flat affect, Insight and Judgment appropriate.   EKG: WNL no changes. Aorta scan WNL  Over 40 minutes of exam, counseling, chart review and critical decision making was performed  Vicie Mutters 1:00 PM [facility]

## 2019-07-10 ENCOUNTER — Ambulatory Visit (INDEPENDENT_AMBULATORY_CARE_PROVIDER_SITE_OTHER): Payer: Medicare Other | Admitting: Physician Assistant

## 2019-07-10 ENCOUNTER — Encounter: Payer: Self-pay | Admitting: Physician Assistant

## 2019-07-10 ENCOUNTER — Other Ambulatory Visit: Payer: Self-pay

## 2019-07-10 VITALS — BP 136/80 | HR 63 | Temp 97.7°F | Wt 279.0 lb

## 2019-07-10 DIAGNOSIS — G4733 Obstructive sleep apnea (adult) (pediatric): Secondary | ICD-10-CM

## 2019-07-10 DIAGNOSIS — E11620 Type 2 diabetes mellitus with diabetic dermatitis: Secondary | ICD-10-CM

## 2019-07-10 DIAGNOSIS — G20A1 Parkinson's disease without dyskinesia, without mention of fluctuations: Secondary | ICD-10-CM

## 2019-07-10 DIAGNOSIS — Z6841 Body Mass Index (BMI) 40.0 and over, adult: Secondary | ICD-10-CM

## 2019-07-10 DIAGNOSIS — Z0000 Encounter for general adult medical examination without abnormal findings: Secondary | ICD-10-CM

## 2019-07-10 DIAGNOSIS — E1322 Other specified diabetes mellitus with diabetic chronic kidney disease: Secondary | ICD-10-CM

## 2019-07-10 DIAGNOSIS — J4520 Mild intermittent asthma, uncomplicated: Secondary | ICD-10-CM

## 2019-07-10 DIAGNOSIS — N183 Chronic kidney disease, stage 3 unspecified: Secondary | ICD-10-CM

## 2019-07-10 DIAGNOSIS — E1169 Type 2 diabetes mellitus with other specified complication: Secondary | ICD-10-CM | POA: Diagnosis not present

## 2019-07-10 DIAGNOSIS — R6889 Other general symptoms and signs: Secondary | ICD-10-CM

## 2019-07-10 DIAGNOSIS — I700 Atherosclerosis of aorta: Secondary | ICD-10-CM | POA: Diagnosis not present

## 2019-07-10 DIAGNOSIS — I2510 Atherosclerotic heart disease of native coronary artery without angina pectoris: Secondary | ICD-10-CM

## 2019-07-10 DIAGNOSIS — Z79899 Other long term (current) drug therapy: Secondary | ICD-10-CM

## 2019-07-10 DIAGNOSIS — G20 Parkinson's disease: Secondary | ICD-10-CM | POA: Diagnosis not present

## 2019-07-10 DIAGNOSIS — Z2239 Carrier of other specified bacterial diseases: Secondary | ICD-10-CM

## 2019-07-10 DIAGNOSIS — E785 Hyperlipidemia, unspecified: Secondary | ICD-10-CM

## 2019-07-10 DIAGNOSIS — Z0001 Encounter for general adult medical examination with abnormal findings: Secondary | ICD-10-CM

## 2019-07-10 DIAGNOSIS — E1365 Other specified diabetes mellitus with hyperglycemia: Secondary | ICD-10-CM

## 2019-07-10 DIAGNOSIS — M25512 Pain in left shoulder: Secondary | ICD-10-CM

## 2019-07-10 DIAGNOSIS — G4752 REM sleep behavior disorder: Secondary | ICD-10-CM

## 2019-07-10 DIAGNOSIS — IMO0002 Reserved for concepts with insufficient information to code with codable children: Secondary | ICD-10-CM

## 2019-07-10 DIAGNOSIS — I10 Essential (primary) hypertension: Secondary | ICD-10-CM

## 2019-07-10 DIAGNOSIS — E559 Vitamin D deficiency, unspecified: Secondary | ICD-10-CM

## 2019-07-10 NOTE — Patient Instructions (Addendum)
Can get voltern gel and put on it Can ice or heat  Will refer to PT and ortho for possible infection  Shoulder Impingement Syndrome  Shoulder impingement syndrome is a condition that causes pain when connective tissues (tendons) surrounding the shoulder joint become pinched. These tendons are part of the group of muscles and tissues that help to stabilize the shoulder (rotator cuff). Beneath the rotator cuff is a fluid-filled sac (bursa) that allows the muscles and tendons to glide smoothly. The bursa may become swollen or irritated (bursitis). Bursitis, swelling in the rotator cuff tendons, or both conditions can decrease how much space is under a bone in the shoulder joint (acromion), resulting in impingement. What are the causes? Shoulder impingement syndrome may be caused by bursitis or swelling of the rotator cuff tendons, which may result from:  Repetitive overhead arm movements.  Falling onto the shoulder.  Weakness in the shoulder muscles. What increases the risk? You may be more likely to develop this condition if you:  Play sports that involve throwing, such as baseball.  Participate in sports such as tennis, volleyball, and swimming.  Work as a Curator, Games developer, or Architect. Some people are also more likely to develop impingement syndrome because of the shape of their acromion bone. What are the signs or symptoms? The main symptom of this condition is pain on the front or side of the shoulder. The pain may:  Get worse when lifting or raising the arm.  Get worse at night.  Wake you up from sleeping.  Feel sharp when the shoulder is moved and then fade to an ache. Other symptoms may include:  Tenderness.  Stiffness.  Inability to raise the arm above shoulder level or behind the body.  Weakness. How is this diagnosed? This condition may be diagnosed based on:  Your symptoms and medical history.  A physical exam.  Imaging tests, such as: ? X-rays. ?  MRI. ? Ultrasound. How is this treated? This condition may be treated by:  Resting your shoulder and avoiding all activities that cause pain or put stress on the shoulder.  Icing your shoulder.  NSAIDs to help reduce pain and swelling.  One or more injections of medicines to numb the area and reduce inflammation.  Physical therapy.  Surgery. This may be needed if nonsurgical treatments have not helped. Surgery may involve repairing the rotator cuff, reshaping the acromion, or removing the bursa. Follow these instructions at home: Managing pain, stiffness, and swelling   If directed, put ice on the injured area. ? Put ice in a plastic bag. ? Place a towel between your skin and the bag. ? Leave the ice on for 20 minutes, 2-3 times a day. Activity  Rest and return to your normal activities as told by your health care provider. Ask your health care provider what activities are safe for you.  Do exercises as told by your health care provider. General instructions  Do not use any products that contain nicotine or tobacco, such as cigarettes, e-cigarettes, and chewing tobacco. These can delay healing. If you need help quitting, ask your health care provider.  Ask your health care provider when it is safe for you to drive.  Take over-the-counter and prescription medicines only as told by your health care provider.  Keep all follow-up visits as told by your health care provider. This is important. How is this prevented?  Give your body time to rest between periods of activity.  Be safe and responsible while being  active. This will help you avoid falls.  Maintain physical fitness, including strength and flexibility. Contact a health care provider if:  Your symptoms have not improved after 1-2 months of treatment and rest.  You cannot lift your arm away from your body. Summary  Shoulder impingement syndrome is a condition that causes pain when connective tissues (tendons)  surrounding the shoulder joint become pinched.  The main symptom of this condition is pain on the front or side of the shoulder.  This condition is usually treated with rest, ice, and pain medicines as needed. This information is not intended to replace advice given to you by your health care provider. Make sure you discuss any questions you have with your health care provider. Document Revised: 08/16/2018 Document Reviewed: 10/17/2017 Elsevier Patient Education  2020 [employer].   Shoulder Impingement Syndrome Rehab Ask your health care provider which exercises are safe for you. Do exercises exactly as told by your health care provider and adjust them as directed. It is normal to feel mild stretching, pulling, tightness, or discomfort as you do these exercises. Stop right away if you feel sudden pain or your pain gets worse. Do not begin these exercises until told by your health care provider. Stretching and range-of-motion exercise This exercise warms up your muscles and joints and improves the movement and flexibility of your shoulder. This exercise also helps to relieve pain and stiffness. Passive horizontal adduction In passive adduction, you use your other hand to move the injured arm toward your body. The injured arm does not move on its own. In this movement, your arm is moved across your body in the horizontal plane (horizontal adduction). 1. Sit or stand and pull your left / right elbow across your chest, toward your other shoulder. Stop when you feel a gentle stretch in the back of your shoulder and upper arm. ? Keep your arm at shoulder height. ? Keep your arm as close to your body as you comfortably can. 2. Hold for __________ seconds. 3. Slowly return to the starting position. Repeat __________ times. Complete this exercise __________ times a day. Strengthening exercises These exercises build strength and endurance in your shoulder. Endurance is the ability to use your muscles  for a long time, even after they get tired. External rotation, isometric This is an exercise in which you press the back of your wrist against a door frame without moving your shoulder joint (isometric). 1. Stand or sit in a doorway, facing the door frame. 2. Bend your left / right elbow and place the back of your wrist against the door frame. Only the back of your wrist should be touching the frame. Keep your upper arm at your side. 3. Gently press your wrist against the door frame, as if you are trying to push your arm away from your abdomen (external rotation). Press as hard as you are able without pain. ? Avoid shrugging your shoulder while you press your wrist against the door frame. Keep your shoulder blade tucked down toward the middle of your back. 4. Hold for __________ seconds. 5. Slowly release the tension, and relax your muscles completely before you repeat the exercise. Repeat __________ times. Complete this exercise __________ times a day. Internal rotation, isometric This is an exercise in which you press your palm against a door frame without moving your shoulder joint (isometric). 1. Stand or sit in a doorway, facing the door frame. 2. Bend your left / right elbow and place the palm of your  hand against the door frame. Only your palm should be touching the frame. Keep your upper arm at your side. 3. Gently press your hand against the door frame, as if you are trying to push your arm toward your abdomen (internal rotation). Press as hard as you are able without pain. ? Avoid shrugging your shoulder while you press your hand against the door frame. Keep your shoulder blade tucked down toward the middle of your back. 4. Hold for __________ seconds. 5. Slowly release the tension, and relax your muscles completely before you repeat the exercise. Repeat __________ times. Complete this exercise __________ times a day. Scapular protraction, supine  1. Lie on your back on a firm surface  (supine position). Hold a __________ weight in your left / right hand. 2. Raise your left / right arm straight into the air so your hand is directly above your shoulder joint. 3. Push the weight into the air so your shoulder (scapula) lifts off the surface that you are lying on. The scapula will push up or forward (protraction). Do not move your head, neck, or back. 4. Hold for __________ seconds. 5. Slowly return to the starting position. Let your muscles relax completely before you repeat this exercise. Repeat __________ times. Complete this exercise __________ times a day. Scapular retraction  1. Sit in a stable chair without armrests, or stand up. 2. Secure an exercise band to a stable object in front of you so the band is at shoulder height. 3. Hold one end of the exercise band in each hand. Your palms should face down. 4. Squeeze your shoulder blades together (retraction) and move your elbows slightly behind you. Do not shrug your shoulders upward while you do this. 5. Hold for __________ seconds. 6. Slowly return to the starting position. Repeat __________ times. Complete this exercise __________ times a day. Shoulder extension  1. Sit in a stable chair without armrests, or stand up. 2. Secure an exercise band to a stable object in front of you so the band is above shoulder height. 3. Hold one end of the exercise band in each hand. 4. Straighten your elbows and lift your hands up to shoulder height. 5. Squeeze your shoulder blades together and pull your hands down to the sides of your thighs (extension). Stop when your hands are straight down by your sides. Do not let your hands go behind your body. 6. Hold for __________ seconds. 7. Slowly return to the starting position. Repeat __________ times. Complete this exercise __________ times a day. This information is not intended to replace advice given to you by your health care provider. Make sure you discuss any questions you have with  your health care provider. Document Revised: 08/16/2018 Document Reviewed: 05/20/2018 Elsevier Patient Education  [employer].   At this time we do not have a plan to get the COVID vaccine in our office. You can go to NightlifeExpo.[email or web address] to find out if you are eligible.  If you are eligible you can go to [email or web address]://[email or web address].[email or web address].[email or web address]  to find a local place that is giving the shot.    [email or web address] also has all of this information if the sites above are not working for you.   There are also several waiting list that you can sign up for here are some below:  [place] go to [email or web address].br to sign up for notification when additional vaccine appointments are available.   You can also sign up at [facility] for a wait list.   [facility] and  [facility] have a wait list on line and you can sign up for it there.     If you have further questions or concerns about the vaccine process, please visit [email or web address]  Individuals seeking information about the vaccines and state's phased distribution plan can learn more by going to - [email or web address]

## 2019-07-16 ENCOUNTER — Other Ambulatory Visit: Payer: Self-pay

## 2019-07-16 ENCOUNTER — Ambulatory Visit: Payer: Medicare Other | Admitting: Orthopaedic Surgery

## 2019-07-16 ENCOUNTER — Encounter: Payer: Self-pay | Admitting: Orthopaedic Surgery

## 2019-07-16 ENCOUNTER — Ambulatory Visit: Payer: Self-pay

## 2019-07-16 DIAGNOSIS — G4733 Obstructive sleep apnea (adult) (pediatric): Secondary | ICD-10-CM | POA: Diagnosis not present

## 2019-07-16 DIAGNOSIS — M25512 Pain in left shoulder: Secondary | ICD-10-CM

## 2019-07-16 NOTE — Progress Notes (Signed)
Office Visit Note   Patient: Todd Rice           Date of Birth: [birth year]           MRN: [id number] Visit Date: 07/16/2019              Requested by: Vicie Mutters, PA-C [address] [place],  [place] [address] PCP: Unk Pinto, MD   Assessment & Plan: Visit Diagnoses:  1. Left shoulder pain, unspecified chronicity     Plan: Due to the fact that he is just 1 week out from his Covid 19-second vaccine will wait at least another week before doing steroid injection.  In fact we will have him do shoulder exercises which handouts were given along with Thera-Band for the next 2 weeks.  If his pain persist then he will follow-up in 2 weeks and will do a subacromial injection.  He understands this could raise his glucose level and he is able to monitor his glucose levels at home and knows how to cover his glucose levels.  Follow-Up Instructions: Return in about 2 weeks (around 07/30/2019).   Orders:  Orders Placed This Encounter  Procedures  . XR Shoulder Left   No orders of the defined types were placed in this encounter.     Procedures: No procedures performed   Clinical Data: No additional findings.   Subjective: Chief Complaint  Patient presents with  . Left Shoulder - Pain    HPI Todd Rice is a 63 year old male comes in today with left shoulder pain for the past 3-1/2 months.  Decreased range of motion decreased strength.  States cold weather makes it worse.  He does have Parkinson's disease and reports about 3 and half months ago he was doing some exercises that he does routinely and felt a popping sharp pain in his shoulder.  He denies any bruising down the arm.  Denies any numbness tingling down the arm.  Mostly has pain with overhead activity of the left shoulder.  Patient is also diabetic last hemoglobin A1c was 7.12 months ago.  Review of Systems Negative for fevers chills shortness of breath chest pain  Objective: Vital Signs:  There were no vitals taken for this visit.  Physical Exam General: Well-developed well-nourished male no acute distress Psych: Alert and oriented x3 Ortho Exam Bilateral shoulders 5 5 strength external and internal rotation against resistance.  Empty can test is negative bilaterally.  Liftoff test negative bilaterally.  Positive impingement left shoulder. Specialty Comments:  No specialty comments available.  Imaging: XR Shoulder Left  Result Date: 07/16/2019 Left shoulder 3 views: No acute fractures.  No bony abnormalities.  No subluxation dislocation.  Glenohumeral joint is well-maintained.    PMFS History: Patient Active Problem List   Diagnosis Date Noted  . Hyperlipidemia associated with type 2 diabetes mellitus ([place]) 07/09/2019  . Aortic atherosclerosis ([place]) 06/10/2018  . Controlled type 2 diabetes mellitus with diabetic dermatitis, without long-term current use of insulin ([place]) 01/21/2018  . OSA treated with BiPAP 01/03/2017  . REM sleep behavior disorder 12/06/2016  . Uncontrolled secondary diabetes mellitus with stage 3 CKD (GFR 30-59) (H[facility]) 10/23/2016  . Medication management 10/23/2016  . Vitamin D deficiency 10/23/2016  . Parkinson disease ([place]) 01/04/2016  . Seasonal and perennial allergic rhinitis 06/21/2014  . Asthma, mild intermittent, well-controlled 06/21/2014  . Pseudomonas aeruginosa colonization 10/08/2012  . CAD (coronary artery disease) 11/15/2011  . Hypertension 11/15/2011  . Hyperlipidemia 11/15/2011  . Morbid obesity with  BMI of 40.0-44.9, adult ([place]) 11/15/2011   Past Medical History:  Diagnosis Date  . Adult BMI 50.0-59.9 kg/sq m    52.77  . Anal fissure   . Anxiety   . Diabetes mellitus without complication (HCC)    borderline  . GERD (gastroesophageal reflux disease)    30 years ago, maybe from peptic ulcer  . H/O: gout    STABLE  . Hepatic steatosis 10/01/11  . History of kidney stones   . Hyperlipidemia   . Hypertension   . Kidney  tumor    right upper kidney tumor, monitoring  . OSA on CPAP    [facility]  . Parkinson disease ([place]) 01/04/2016  . REM sleep behavior disorder 12/06/2016  . Right ureteral stone   . Ureteral calculus, right 06/12/2011  . Vitamin D deficiency   . Weakness     Family History  Problem Relation Age of Onset  . Heart disease Mother        Atrial fibrillation  . Hypertension Mother   . Hyperlipidemia Mother   . Lung cancer Father        was a smoker  . Cancer Father        lung  . Hyperlipidemia Brother   . Heart disease Maternal Aunt   . Hyperlipidemia Maternal Aunt   . Hypertension Maternal Aunt   . Stroke Maternal Aunt   . Heart disease Paternal Grandmother   . Hyperlipidemia Paternal Grandmother     Past Surgical History:  Procedure Laterality Date  . ANAL FISSURE REPAIR  10-12-2000  . COLONOSCOPY N/A 07/01/2012   Procedure: COLONOSCOPY;  Surgeon: Lafayette Dragon, MD;  Location: [facility];  Service: Endoscopy;  Laterality: N/A;  . CYSTOSCOPY WITH RETROGRADE PYELOGRAM, URETEROSCOPY AND STENT PLACEMENT Bilateral 02/26/2013   Procedure: CYSTOSCOPY WITH RETROGRADE PYELOGRAM, URETEROSCOPY AND STENT PLACEMENT;  Surgeon: Alexis Frock, MD;  Location: [facility];  Service: Urology;  Laterality: Bilateral;  . HOLMIUM LASER APPLICATION Bilateral [id number]   Procedure: HOLMIUM LASER APPLICATION;  Surgeon: Alexis Frock, MD;  Location: [facility];  Service: Urology;  Laterality: Bilateral;  . kidney stone removal     06/2011-stent placement  . LEFT MEDIAL FEMORAL CONDYLE DEBRIDEMENT & DRILLING/ REMOVAL LOOSE BODY  09-15-2003  . NASAL SEPTUM SURGERY  1985  . STONE EXTRACTION WITH BASKET  06/12/2011   Procedure: STONE EXTRACTION WITH BASKET;  Surgeon: Claybon Jabs, MD;  [facility];  Service: Urology;  Laterality: Right;   Social History   Occupational History  . Not on file  Tobacco Use  . Smoking status: Never Smoker  . Smokeless tobacco: Never Used  Substance  and Sexual Activity  . Alcohol use: No  . Drug use: No    Comment: QUIT SMOKING " POT" 40 YRS AGO  . Sexual activity: Not on file

## 2019-07-17 ENCOUNTER — Encounter: Payer: Self-pay | Admitting: *Deleted

## 2019-07-22 MED FILL — JANUVIA 100 MG TABLET: 100 | 30 days supply | Qty: 30 | Fill #1

## 2019-07-28 ENCOUNTER — Encounter: Payer: Self-pay | Admitting: Neurology

## 2019-07-30 ENCOUNTER — Ambulatory Visit: Payer: Medicare Other | Admitting: Orthopaedic Surgery

## 2019-07-30 ENCOUNTER — Other Ambulatory Visit: Payer: Self-pay

## 2019-07-30 ENCOUNTER — Encounter: Payer: Self-pay | Admitting: Orthopaedic Surgery

## 2019-07-30 ENCOUNTER — Ambulatory Visit: Payer: Medicare Other | Admitting: Neurology

## 2019-07-30 ENCOUNTER — Encounter: Payer: Self-pay | Admitting: Neurology

## 2019-07-30 VITALS — BP 139/82 | HR 83 | Temp 97.6°F | Ht 67.0 in | Wt 278.0 lb

## 2019-07-30 DIAGNOSIS — G4709 Other insomnia: Secondary | ICD-10-CM | POA: Diagnosis not present

## 2019-07-30 DIAGNOSIS — G20 Parkinson's disease: Secondary | ICD-10-CM

## 2019-07-30 DIAGNOSIS — I2510 Atherosclerotic heart disease of native coronary artery without angina pectoris: Secondary | ICD-10-CM

## 2019-07-30 DIAGNOSIS — G4752 REM sleep behavior disorder: Secondary | ICD-10-CM

## 2019-07-30 DIAGNOSIS — Z6841 Body Mass Index (BMI) 40.0 and over, adult: Secondary | ICD-10-CM

## 2019-07-30 DIAGNOSIS — M25512 Pain in left shoulder: Secondary | ICD-10-CM | POA: Diagnosis not present

## 2019-07-30 DIAGNOSIS — R063 Periodic breathing: Secondary | ICD-10-CM | POA: Diagnosis not present

## 2019-07-30 MED ORDER — ZOLPIDEM TARTRATE 5 MG PO TABS: 5.0000 mg | ORAL_TABLET | Freq: Every evening | ORAL | 0 refills | Status: DC | PRN

## 2019-07-30 MED ORDER — LIDOCAINE HCL 1 % IJ SOLN
3.0000 mL | INTRAMUSCULAR | Status: AC | PRN
  Administered 2019-07-30: 3 mL

## 2019-07-30 MED ORDER — MOMETASONE FUROATE 50 MCG/ACT NA SUSP: 2.0000 | Freq: Every day | NASAL | 12 refills | Status: DC

## 2019-07-30 MED ORDER — METHYLPREDNISOLONE ACETATE 40 MG/ML IJ SUSP
40.0000 mg | INTRAMUSCULAR | Status: AC | PRN
  Administered 2019-07-30: 40 mg via INTRA_ARTICULAR

## 2019-07-30 MED FILL — ZOLPIDEM TARTRATE 5 MG TAB: 5 | 30 days supply | Qty: 30 | Fill #0

## 2019-07-30 NOTE — Progress Notes (Signed)
Office Visit Note   Patient: Todd Rice           Date of Birth: Apr 25, 1956           MRN: [id number] Visit Date: 07/30/2019              Requested by: Unk Pinto, [address] [place],  [place] [address] PCP: Unk Pinto, MD   Assessment & Plan: Visit Diagnoses:  1. Left shoulder pain, unspecified chronicity     Plan: He will monitor his glucose levels closely over the next couple of days.  He knows how to cover his glucose levels if they become elevated.  He will continue his shoulder exercises.  If he still having pain next 4 weeks he will call our office we will obtain an MRI rule out rotator cuff tear.  Questions encouraged and answered.  Follow-up as needed.  Follow-Up Instructions: Return if symptoms worsen or fail to improve.   Orders:  No orders of the defined types were placed in this encounter.  No orders of the defined types were placed in this encounter.     Procedures: Large Joint Inj: L subacromial bursa on 07/30/2019 9:35 AM Indications: pain Details: 22 G 1.5 in needle, superior approach  Arthrogram: No  Medications: 3 mL lidocaine 1 %; 40 mg methylPREDNISolone acetate 40 MG/ML Outcome: tolerated well, no immediate complications Procedure, treatment alternatives, risks and benefits explained, specific risks discussed. Consent was given by the patient. Immediately prior to procedure a time out was called to verify the correct patient, procedure, equipment, support staff and site/side marked as required. Patient was prepped and draped in the usual sterile fashion.       Clinical Data: No additional findings.   Subjective: Chief Complaint  Patient presents with  . Left Shoulder - Follow-up    HPI [name] returns today follow-up of his left shoulder.  He states that shoulder pain is somewhat better just with the exercises but still having pain with overhead motion of the shoulder and external rotation.  7 no  numbness or tingling down the left arm.  Reports his glucose levels are running anywhere from 100 150.  He does check his sugars daily.   Review of Systems See HPI otherwise negative.  Objective: Vital Signs: There were no vitals taken for this visit.  Physical Exam Constitutional:      Appearance: He is not ill-appearing or diaphoretic.  Pulmonary:     Effort: Pulmonary effort is normal.  Neurological:     Mental Status: He is alert and oriented to person, place, and time.  Psychiatric:        Mood and Affect: Mood normal.     Ortho Exam Bilateral shoulders 5 5 strength with external and internal rotation against resistance.  Negative empty can test bilaterally.  Positive impingement on the left.  Full forward flexion left shoulder. Specialty Comments:  No specialty comments available.  Imaging: No results found.   PMFS History: Patient Active Problem List   Diagnosis Date Noted  . Hyperlipidemia associated with type 2 diabetes mellitus ([place]) 07/09/2019  . Aortic atherosclerosis ([place]) 06/10/2018  . Controlled type 2 diabetes mellitus with diabetic dermatitis, without long-term current use of insulin ([place]) 01/21/2018  . OSA treated with BiPAP 01/03/2017  . REM sleep behavior disorder 12/06/2016  . Uncontrolled secondary diabetes mellitus with stage 3 CKD (GFR 30-59) (HCC) 10/23/2016  . Medication management 10/23/2016  . Vitamin D deficiency 10/23/2016  .  Parkinson disease ([place]) 01/04/2016  . Seasonal and perennial allergic rhinitis 06/21/2014  . Asthma, mild intermittent, well-controlled 06/21/2014  . Pseudomonas aeruginosa colonization 10/08/2012  . CAD (coronary artery disease) 11/15/2011  . Hypertension 11/15/2011  . Hyperlipidemia 11/15/2011  . Morbid obesity with BMI of 40.0-44.9, adult ([place]) 11/15/2011   Past Medical History:  Diagnosis Date  . Adult BMI 50.0-59.9 kg/sq m    52.77  . Anal fissure   . Anxiety   . Diabetes mellitus without complication  (HCC)    borderline  . GERD (gastroesophageal reflux disease)    30 years ago, maybe from peptic ulcer  . H/O: gout    STABLE  . Hepatic steatosis 10/01/11  . History of kidney stones   . Hyperlipidemia   . Hypertension   . Kidney tumor    right upper kidney tumor, monitoring  . OSA on CPAP    [facility]  . Parkinson disease ([place]) 01/04/2016  . REM sleep behavior disorder 12/06/2016  . Right ureteral stone   . Ureteral calculus, right 06/12/2011  . Vitamin D deficiency   . Weakness     Family History  Problem Relation Age of Onset  . Heart disease Mother        Atrial fibrillation  . Hypertension Mother   . Hyperlipidemia Mother   . Lung cancer Father        was a smoker  . Cancer Father        lung  . Hyperlipidemia Brother   . Heart disease Maternal Aunt   . Hyperlipidemia Maternal Aunt   . Hypertension Maternal Aunt   . Stroke Maternal Aunt   . Heart disease Paternal Grandmother   . Hyperlipidemia Paternal Grandmother     Past Surgical History:  Procedure Laterality Date  . ANAL FISSURE REPAIR  10-12-2000  . COLONOSCOPY N/A 07/01/2012   Procedure: COLONOSCOPY;  Surgeon: Lafayette Dragon, MD;  [facility];  Service: Endoscopy;  Laterality: N/A;  . CYSTOSCOPY WITH RETROGRADE PYELOGRAM, URETEROSCOPY AND STENT PLACEMENT Bilateral 02/26/2013   Procedure: CYSTOSCOPY WITH RETROGRADE PYELOGRAM, URETEROSCOPY AND STENT PLACEMENT;  Surgeon: Alexis Frock, MD;  [facility];  Service: Urology;  Laterality: Bilateral;  . HOLMIUM LASER APPLICATION Bilateral [id number]   Procedure: HOLMIUM LASER APPLICATION;  Surgeon: Alexis Frock, MD;  Location[facility];  Service: Urology;  Laterality: Bilateral;  . kidney stone removal     06/2011-stent placement  . LEFT MEDIAL FEMORAL CONDYLE DEBRIDEMENT & DRILLING/ REMOVAL LOOSE BODY  09-15-2003  . NASAL SEPTUM SURGERY  1985  . STONE EXTRACTION WITH BASKET  06/12/2011   Procedure: STONE EXTRACTION WITH BASKET;  Surgeon: Claybon Jabs, MD;  Location: [facility];  Service: Urology;  Laterality: Right;   Social History   Occupational History  . Not on file  Tobacco Use  . Smoking status: Never Smoker  . Smokeless tobacco: Never Used  Substance and Sexual Activity  . Alcohol use: No  . Drug use: No    Comment: QUIT SMOKING " POT" 40 YRS AGO  . Sexual activity: Not on file

## 2019-07-30 NOTE — Progress Notes (Signed)
PATIENT: Todd Rice                     [facility] DOB: April 22, 1956  REASON FOR VISIT: follow up HISTORY FROM: patient  HISTORY OF PRESENT ILLNESS:    RV from  07/30/19 ,    07-30-2019. pt with wife, rm 55. Todd Rice is a 64 year-old male patient of Dr [name] with a concern about sleep quality. States machine is working ok but he has noticed in last couple months a decline in his sleep quality and duration, stating that he will go 3-4 nights in a row with only  4-5 hours of sleep and then have a night where he sleeps maybe 8-9 hrs. He uses a FFM and yet has oral air leaks, increasing.  Cyclic sleep pattern. Essential hypertension, shoulder injury, has been in outpatient Rehab for his PD.  Patient has received both Covid shots.   BMI is unchanged at 43. 5 kg/m2.  He walks outside, not going to the gym. He is mask compliant. Bedtime is 10-11PM and rise time is 5.30 AM-  An entrained pattern.   This patient has been 100% compliant CPAP user by days but when it comes to 4 hours of consecutive use he seems to have had 5 days with trouble last months.  His average user time is about 5-1/2 hours, he is using a ASV machine air curve with a maximum inspiratory pressure of 17 minimum expiratory PS of10 cmH2O and PPEP support of 4 cmH2O.  No central apneas were recorded.  The obstructive apneas are 1.0 be hypopneas 0.8/h and the unknown apneas filtration 1.5" this is usually at arrhythmias count due to air leakage.  The machine mistakes an oral airleak for an apnea. The ASV is 64 years old in October- it sometimes shuts off- and leaves him breathless.  This 64 y.o. year old male  has a past medical history of Adult BMI 50.0-59.9 kg/sq m, Anal fissure, Anxiety, Diabetes mellitus without complication ([place]), GERD (gastroesophageal reflux disease), H/O: gout, Hepatic steatosis (10/01/11), History of kidney stones, Hyperlipidemia, Hypertension, Kidney tumor, OSA on CPAP, Parkinson disease  ([place]) (01/04/2016), REM sleep behavior disorder (12/06/2016), Right ureteral stone, Ureteral calculus, right (06/12/2011), Vitamin D deficiency, and Weakness.   I have the pleasure of meeting Ms. Phyllis Ginger Edwin today, I mean by 64 year old Caucasian right-handed gentleman with a history of OSA treated on CPAP.  This is a routine follow-up for this patient who has been a highly compliant CPAP user.  He has used the machine 100% of the last 30 days and for 97% of these time over 4 hours consecutively there was only 1 day where he was a little short for 20 years of February.  He is using an air curve ASV machine with a maximum inspiratory pressure of 17 a minimum pressure support of 10 and an EPAP of 4 cmH2O.  He has only 2.4 apneas per hour left and most of these are obstructive.  He does have some massive air leakage, through the mouth-this is an air leak is much more likely in a Parkinson's patient. He feels most mornings refreshed and restored, some nights without interruption.    Todd Rice is a 64 year old male with a history of obstructive sleep apnea on CPAP.  He returns today for follow-up.  His download indicates that he use his machine 30 out of 30 days for compliance of 100%.  He uses machine greater than 4  hours 28 out of 30 days for compliance of 93%.  On average he uses his machine 6 hours and 45 minutes.  His residual AHI is 2.4.  He is on a maximum inspiratory pressure of 17 cm of water and minimum expiratory pressure of 10 cm of water with pressure support of 4 cm of water.  His leak in the 95th percentile is 98 L/min.  He states that often times during the night his mask will ride up into his mouth potentially causing a leak.  He reports that he is tried tightening the straps but continues to rise into his mouth.  He returns today for evaluation.  HISTORY copied from Dr. Edwena Felty note: I had the pleasure to day of seeing Todd Rice again, wed undergone a split night polysomnography at  [facility] on 02/24/2016. He was still diagnosed with a high degree of apnea his AHI was 33.6 he slept all night in supine position and he did not enter REM sleep. Oxygen nadir was 88% is only 2 minutes of desaturation and remarkably this patient, who suffers from Parkinson's disease, did not have periodic limb movements. He was titrated to CPAP up to 18 cm water pressure without resolution of apnea. The medical technologist changed him to a BiPAP setting which did resolve apnea at a setting of 20/16 cm water overall he did not sleep very well in the laboratory environment but he would finally able to find a mask that seem to fit him well-  an [employer]. I'm able today to see his compliance download which has a remarkable 100% of use for the last 30 days with 75% compliance for over 4 hours of nightly use consecutively. His minimum inspiratory pressure is 17 and expiratory pressure 10 cm water, 4 cm pressure support, residual AHI is 2.1 which is a very good resolution. There were no treatment emergent central apneas on the setting.  REVIEW OF SYSTEMS: Out of a complete 14 system review of symptoms, the patient complains only of the following symptoms, and all other reviewed systems are negative.   The patient reports a more sound sleep is a more refreshing and restorative quality.  He endorsed the Epworth Sleepiness Scale at 2 points out of 24, the fatigue severity score was not endorsed, the depression scale was endorsed at 1 out of 15 points and is not indicative of clinical depression. The patient's daytime routines have not changed, his bedtimes mealtimes.  He is currently taking medication 3 times a day for Parkinson's disease. Vivid dreams, "crazy" - but he likes the dreams.  He has cyclic insomnia.  He remains morbidly obese, grade 3.  How likely are you to doze in the following situations: 0 = not likely, 1 = slight chance, 2 = moderate chance, 3 = high chance  Sitting and  Reading? Watching Television? Sitting inactive in a public place (theater or meeting)? Lying down in the afternoon when circumstances permit? Sitting and talking to someone? Sitting quietly after lunch without alcohol? In a car, while stopped for a few minutes in traffic? As a passenger in a car for an hour without a break?  Total = 6/ 24      ALLERGIES: Allergies  Allergen Reactions  . Ciprofloxacin Hives  . Ceftriaxone Hives    HOME MEDICATIONS: Outpatient Medications Prior to Visit  Medication Sig Dispense Refill  . allopurinol (ZYLOPRIM) 300 MG tablet TAKE 1 TABLET BY MOUTH ONCE DAILY 30 tablet 3  . ALPRAZolam Duanne Moron)  0.5 MG tablet Take 1 tablet (0.5 mg total) by mouth 3 (three) times daily. 90 tablet 2  . aspirin EC 81 MG tablet Take 81 mg by mouth at bedtime.    . carbidopa-levodopa (SINEMET IR) 25-100 MG tablet TAKE 2 TABLETS BY MOUTH THREE TIMES DAILY 180 tablet 2  . glipiZIDE (GLUCOTROL) 5 MG tablet Take 1 tablet (5 mg total) by mouth 2 (two) times daily before a meal. 60 tablet 2  . JANUVIA 100 MG tablet Take 100 mg by mouth daily.    . metFORMIN (GLUCOPHAGE-XR) 500 MG 24 hr tablet TAKE 2 TABLETS 2 TIMES A DAY FOR DIABETES 360 tablet 3  . pramipexole (MIRAPEX) 1 MG tablet Take 1 tablet (1 mg total) by mouth 3 (three) times daily. 270 tablet 3  . rosuvastatin (CRESTOR) 40 MG tablet Take 1 tablet (40 mg total) by mouth daily. 90 tablet 1  . valsartan-hydrochlorothiazide (DIOVAN-HCT) 320-25 MG tablet Take 1 tablet Daily for BP 90 tablet 3  . VENTOLIN HFA 108 (90 Base) MCG/ACT inhaler INHALE 2 PUFFS INTO THE LUNGS EVERY 6 HOURS AS NEEDED FOR WHEEZING OR SHORTNESS OF BREATH. 48 g 3  . vitamin C (ASCORBIC ACID) 500 MG tablet Take 1 tablet (500 mg total) by mouth 3 (three) times daily. 21 tablet 0  . VITAMIN D PO Take 5,000 Units by mouth 2 (two) times daily.    Marland Kitchen buPROPion (WELLBUTRIN XL) 150 MG 24 hr tablet Take 1 tablet (150 mg total) by mouth every morning. 30 tablet 2    No facility-administered medications prior to visit.    PAST MEDICAL HISTORY: Past Medical History:  Diagnosis Date  . Adult BMI 50.0-59.9 kg/sq m    52.77  . Anal fissure   . Anxiety   . Diabetes mellitus without complication (HCC)    borderline  . GERD (gastroesophageal reflux disease)    30 years ago, maybe from peptic ulcer  . H/O: gout    STABLE  . Hepatic steatosis 10/01/11  . History of kidney stones   . Hyperlipidemia   . Hypertension   . Kidney tumor    right upper kidney tumor, monitoring  . OSA on CPAP    [facility]  . Parkinson disease ([place]) 01/04/2016  . REM sleep behavior disorder 12/06/2016  . Right ureteral stone   . Ureteral calculus, right 06/12/2011  . Vitamin D deficiency   . Weakness     PAST SURGICAL HISTORY: Past Surgical History:  Procedure Laterality Date  . ANAL FISSURE REPAIR  10-12-2000  . COLONOSCOPY N/A 07/01/2012   Procedure: COLONOSCOPY;  Surgeon: Lafayette Dragon, MD;  Location: [facility];  Service: Endoscopy;  Laterality: N/A;  . CYSTOSCOPY WITH RETROGRADE PYELOGRAM, URETEROSCOPY AND STENT PLACEMENT Bilateral 02/26/2013   Procedure: CYSTOSCOPY WITH RETROGRADE PYELOGRAM, URETEROSCOPY AND STENT PLACEMENT;  Surgeon: Alexis Frock, MD;  Location: [facility];  Service: Urology;  Laterality: Bilateral;  . HOLMIUM LASER APPLICATION Bilateral [id number]   Procedure: HOLMIUM LASER APPLICATION;  Surgeon: Alexis Frock, MD;  Location: [facility];  Service: Urology;  Laterality: Bilateral;  . kidney stone removal     06/2011-stent placement  . LEFT MEDIAL FEMORAL CONDYLE DEBRIDEMENT & DRILLING/ REMOVAL LOOSE BODY  09-15-2003  . NASAL SEPTUM SURGERY  1985  . STONE EXTRACTION WITH BASKET  06/12/2011   Procedure: STONE EXTRACTION WITH BASKET;  Surgeon: Claybon Jabs, MD;  [facility];  Service: Urology;  Laterality: Right;    FAMILY HISTORY: Family History  Problem  Relation Age of Onset  . Heart disease Mother        Atrial  fibrillation  . Hypertension Mother   . Hyperlipidemia Mother   . Lung cancer Father        was a smoker  . Cancer Father        lung  . Hyperlipidemia Brother   . Heart disease Maternal Aunt   . Hyperlipidemia Maternal Aunt   . Hypertension Maternal Aunt   . Stroke Maternal Aunt   . Heart disease Paternal Grandmother   . Hyperlipidemia Paternal Grandmother     SOCIAL HISTORY: Social History   Socioeconomic History  . Marital status: Married    Spouse name: Not on file  . Number of children: 0  . Years of education: Not on file  . Highest education level: Not on file  Occupational History  . Not on file  Social Needs  . Financial resource strain: Not on file  . Food insecurity:    Worry: Not on file    Inability: Not on file  . Transportation needs:    Medical: Not on file    Non-medical: Not on file  Tobacco Use  . Smoking status: Never Smoker  . Smokeless tobacco: Never Used  Substance and Sexual Activity  . Alcohol use: No  . Drug use: No    Comment: QUIT SMOKING " POT" 40 YRS AGO  . Sexual activity: Not on file  Lifestyle  . Physical activity:    Days per week:  5 days a week for 30 minutes or more.     Minutes per session: Daily stretching exercises.                                                                 Other Topics Concern  . Not on file  Social History Narrative   Right-handed.   No caffeine use.   Lives at home with his wife.      PHYSICAL EXAM  Vitals:   07/30/19 1323  BP: 139/82  Pulse: 83  Temp: 97.6 F (36.4 C)  Weight: 278 lb (126.1 kg)  Height: 5\' 7"  (1.702 m)   Body mass index is 43.54 kg/m.  Generalized:  Obese, Ankle edema.    Neurological examination  Mentation: Alert oriented to time, place, history taking. Follows all commands speech and language fluent- he is short of breath, his voice is dysphonic and low volume.   Cranial nerve:  No loss of smell or taste. Pupils were equal round reactive to  light.  Extraocular movements were full, visual field were full . Right sided ptosis. Facial expression is masked - rarely blinking. Uvula and tongue move in midline. Head turning and shoulder shrug  were limited. Motor: reduced strength and increased muscle tone of all 4 extremities.  Cogwheeling. Sensory: intact to vibration and soft touch on all extremities. Ankle edema affected vibration sense.  Coordination: slowed movements,  Gait and station: Gait is stooped, very slowed, and of wider base.  Reflexes: Deep tendon reflexes are symmetrically attenuated.  DIAGNOSTIC DATA (LABS, IMAGING, TESTING) - I reviewed patient records, labs, notes, testing and imaging myself where available.  Lab Results  Component Value Date   WBC 9.0 05/15/2019   HGB 15.6 05/15/2019  HCT 46.0 05/15/2019   MCV 83.9 05/15/2019   PLT 198 05/15/2019      Component Value Date/Time   NA 136 05/15/2019 1138   K 4.0 05/15/2019 1138   CL 98 05/15/2019 1138   CO2 28 05/15/2019 1138   GLUCOSE 215 (H) 05/15/2019 1138   BUN 20 05/15/2019 1138   CREATININE 1.15 05/15/2019 1138   CALCIUM 10.2 05/15/2019 1138   PROT 7.1 05/15/2019 1138   ALBUMIN 3.8 10/24/2016 1601   AST 21 05/15/2019 1138   ALT 18 05/15/2019 1138   ALKPHOS 58 10/24/2016 1601   BILITOT 0.4 05/15/2019 1138   GFRNONAA 67 05/15/2019 1138   GFRAA 78 05/15/2019 1138   Lab Results  Component Value Date   CHOL 178 05/15/2019   HDL 28 (L) 05/15/2019   LDLCALC  05/15/2019     Comment:     . LDL cholesterol not calculated. Triglyceride levels greater than 400 mg/dL invalidate calculated LDL results. . Reference range: <100 . Desirable range <100 mg/dL for primary prevention;   <70 mg/dL for patients with CHD or diabetic patients  with > or = 2 CHD risk factors. Marland Kitchen LDL-C is now calculated using the Martin-Hopkins  calculation, which is a validated novel method providing  better accuracy than the Friedewald equation in the  estimation of LDL-C.   Cresenciano Genre et al. Annamaria Helling. [id number]): 2061-2068  (http://education.[employer].com/faq/FAQ164)    TRIG 472 (H) 05/15/2019   CHOLHDL 6.4 (H) 05/15/2019   Lab Results  Component Value Date   HGBA1C 7.1 (H) 05/15/2019   Lab Results  Component Value Date   [id number] 09/26/2018   Lab Results  Component Value Date   TSH 3.96 05/15/2019      ASSESSMENT - 28 minute VISIT duration.    1. OSA on ASV/ advanced servo ventilation .- The machine will need to be replaced in October 2748, 64 years old. He still has a very dry moth.  2. Insomnia, non refreshing sleep, reduced sleep duration.    3. PD - advanced. Affecting soft palatal tone, speech dysphonia and causing chin drop at night- thus creating air leaks.   PLANASV 17/10 /0/4cm pressure support. the patient has a high oral Air leak.  Uses a FFM. The chin drop may be the answer - and his water reservoir is empty every morning.  He needs a chin strap.  He should try a REsMed large F30 I with nasal pillows.  His nose is always stooped up- deviated septum repair was not successful. He will always be a mouth breather.  I will order a HST off machine to get current baseline apnea data.  I will oder a chin strap I will order ambien prn 5 mg. I will order nasonex spray.     He will follow-up in 3 month for seep clinic care , and continues ot see Dr. [name] for PD>   I spent  28 minutes with the patient. Face to face- reviewing CPAP download and medication changes, PD progression and effects on sleep quality, fatigue level.    Larey Seat, MD   07/30/2019, 1:48 PM [facility] [address] [place], [place] [address] [phone]

## 2019-08-04 MED FILL — ALLOPURINOL 300 MG TABS: 300 | 30 days supply | Qty: 30 | Fill #2

## 2019-08-04 MED FILL — VALSARTAN-HCTZ 320-25 MG TA: 320-25 | 30 days supply | Qty: 30 | Fill #5

## 2019-08-07 NOTE — Progress Notes (Signed)
Assessment and Plan:   Uncontrolled secondary diabetes mellitus with stage 3 CKD (GFR 30-59) (HCC) -     Hemoglobin A1c - follow up 3 months for progress monitoring - increase veggies, decrease carbs - long discussion about weight loss, diet, and exercise  Aortic atherosclerosis (HCC) Control blood pressure, cholesterol, glucose, increase exercise.   Hyperlipidemia associated with type 2 diabetes mellitus ([place]) Discussed general issues about diabetes pathophysiology and management., Educational material distributed., Suggested low cholesterol diet., Encouraged aerobic exercise., Discussed foot care., Reminded to get yearly retinal exam.  Elevated PSA -     PSA - will send labs to Dr. Tresa Moore  Vitamin D deficiency -     VITAMIN D 25 Hydroxy (Vit-D Deficiency, Fractures)  Acute pain of left shoulder -     Ambulatory referral to Physical Therapy Has seen ortho, had injections, he is doing OT for parkinson's needs PT for tendonitis left shoulder.   Hypertension:  -Continue medication,  -monitor blood pressure at home.  -Continue DASH diet.   -Reminder to go to the ER if any CP, SOB, nausea, dizziness, severe HA, changes vision/speech, left arm numbness and tingling, and jaw pain.  Cholesterol: -Continue diet and exercise.  -Check cholesterol.   Parkinsons -cont medications -followed by neuro  Depression - continue wellbutrin stress management techniques discussed, increase water, good sleep hygiene discussed, increase exercise, and increase veggies.   Morbid Obesity - follow up 3 months for progress monitoring - increase veggies, decrease carbs - long discussion about weight loss, diet, and exercise   Continue diet and meds as discussed. Further disposition pending results of labs. Future Appointments  Date Time Provider [place]  08/20/2019  9:00 AM [facility] None  09/15/2019  7:30 AM Kathrynn Ducking, MD [facility] None  11/12/2019  9:30 AM  Vicie Mutters, PA-C [facility] None  02/17/2020  9:00 AM Vicie Mutters, PA-C [facility] None  07/15/2020 10:00 AM Vicie Mutters, PA-C [facility] None    HPI 64 y.o. male  presents for 3 month follow up with hypertension, hyperlipidemia, prediabetes and vitamin D.  He is being followed by Dr. Ardis Hughs for pancreatic cyst.  He is following with nephrology for possible RCC, it shrunk on its own so they will monitor it with appointment soon  Patient reports that he has been following with neurology and is starting to do OT again for his parkinsons.  He reports that his right hand has still been very difficult.  He reports that he has been taking the mirapex and sinemet. Follows with Dr. Benson Norway.  He is on a BiPAP machine, uses regularly.   BMI is Body mass index is 43.54 kg/m., he is working on diet and exercise. Wt Readings from Last 3 Encounters:  08/13/19 278 lb (126.1 kg)  07/30/19 278 lb (126.1 kg)  07/10/19 279 lb (126.6 kg)     His blood pressure has been controlled at home, today their BP is BP: 126/84.   He does not workout. He denies chest pain, shortness of breath, dizziness.   He is on cholesterol medication and denies myalgias. His cholesterol is at goal. The cholesterol last visit was:   Lab Results  Component Value Date   CHOL 178 05/15/2019   HDL 28 (L) 05/15/2019   Roe  05/15/2019     Comment:     . LDL cholesterol not calculated. Triglyceride levels greater than 400 mg/dL invalidate calculated LDL results. . Reference range: <100 . Desirable range <100 mg/dL for primary prevention;   <  70 mg/dL for patients with CHD or diabetic patients  with > or = 2 CHD risk factors. Marland Kitchen LDL-C is now calculated using the Martin-Hopkins  calculation, which is a validated novel method providing  better accuracy than the Friedewald equation in the  estimation of LDL-C.  Cresenciano Genre et al. Annamaria Helling. [id number]): 2061-2068  (http://education.[facility].com/faq/FAQ164)     TRIG 472 (H) 05/15/2019   CHOLHDL 6.4 (H) 05/15/2019   He has been working on diet and exercise for Diabetes  with diabetic chronic kidney disease he is on ARB With hyperlipidemia on crestor  he is on bASA He is on metformin 1000mg  BID Started back on the [place] 100mg  daily (cost 47 30 days) Glipizide 5 mg he is not taking it, trying januiva Sugars in the AM highest is 144- normally in 130's.  he will eat twice a day, lunch will just have fruit, dinner is largest meal of the day.  denies paresthesia of the feet, polydipsia, polyuria and visual disturbances. Last A1C was:  Lab Results  Component Value Date   HGBA1C 7.1 (H) 05/15/2019   Lab Results  Component Value Date   GFRNONAA 67 05/15/2019   Patient is on Vitamin D supplement.  Lab Results  Component Value Date   VD25OH 72 02/06/2019     Patient is on allopurinol for gout and does report a recent flare.  Lab Results  Component Value Date   LABURIC 5.7 05/15/2019    Current Medications:   Current Outpatient Medications (Endocrine & Metabolic):  .  glipiZIDE (GLUCOTROL) 5 MG tablet, Take 1 tablet (5 mg total) by mouth 2 (two) times daily before a meal. .  JANUVIA 100 MG tablet, Take 100 mg by mouth daily. .  metFORMIN (GLUCOPHAGE-XR) 500 MG 24 hr tablet, TAKE 2 TABLETS 2 TIMES A DAY FOR DIABETES  Current Outpatient Medications (Cardiovascular):  .  rosuvastatin (CRESTOR) 40 MG tablet, Take 1 tablet (40 mg total) by mouth daily. .  valsartan-hydrochlorothiazide (DIOVAN-HCT) 320-25 MG tablet, Take 1 tablet Daily for BP  Current Outpatient Medications (Respiratory):  .  mometasone (NASONEX) 50 MCG/ACT nasal spray, Place 2 sprays into the nose daily. .  VENTOLIN HFA 108 (90 Base) MCG/ACT inhaler, INHALE 2 PUFFS INTO THE LUNGS EVERY 6 HOURS AS NEEDED FOR WHEEZING OR SHORTNESS OF BREATH.  Current Outpatient Medications (Analgesics):  .  allopurinol (ZYLOPRIM) 300 MG tablet, TAKE 1 TABLET BY MOUTH ONCE DAILY .  aspirin EC  81 MG tablet, Take 81 mg by mouth at bedtime.   Current Outpatient Medications (Other):  Marland Kitchen  ALPRAZolam (XANAX) 0.5 MG tablet, Take 1 tablet (0.5 mg total) by mouth 3 (three) times daily. .  carbidopa-levodopa (SINEMET IR) 25-100 MG tablet, TAKE 2 TABLETS BY MOUTH THREE TIMES DAILY .  pramipexole (MIRAPEX) 1 MG tablet, Take 1 tablet (1 mg total) by mouth 3 (three) times daily. .  vitamin C (ASCORBIC ACID) 500 MG tablet, Take 1 tablet (500 mg total) by mouth 3 (three) times daily. Marland Kitchen  VITAMIN D PO, Take 5,000 Units by mouth 2 (two) times daily. Marland Kitchen  zolpidem (AMBIEN) 5 MG tablet, Take 1 tablet (5 mg total) by mouth at bedtime as needed for sleep. Marland Kitchen  buPROPion (WELLBUTRIN XL) 150 MG 24 hr tablet, Take 1 tablet (150 mg total) by mouth every morning.  Medical History:  Past Medical History:  Diagnosis Date  . Adult BMI 50.0-59.9 kg/sq m    52.77  . Anal fissure   . Anxiety   . Diabetes  mellitus without complication (HCC)    borderline  . GERD (gastroesophageal reflux disease)    30 years ago, maybe from peptic ulcer  . H/O: gout    STABLE  . Hepatic steatosis 10/01/11  . History of kidney stones   . Hyperlipidemia   . Hypertension   . Kidney tumor    right upper kidney tumor, monitoring  . OSA on CPAP    [facility]  . Parkinson disease ([place]) 01/04/2016  . REM sleep behavior disorder 12/06/2016  . Right ureteral stone   . Ureteral calculus, right 06/12/2011  . Vitamin D deficiency   . Weakness     Allergies:  Allergies  Allergen Reactions  . Ciprofloxacin Hives  . Ceftriaxone Hives     Review of Systems:  Review of Systems  Constitutional: Negative for chills, fever and malaise/fatigue.  HENT: Negative for congestion, ear pain and sore throat.   Eyes: Negative.   Respiratory: Negative for cough, shortness of breath and wheezing.   Cardiovascular: Negative for chest pain, palpitations and leg swelling.  Gastrointestinal: Negative for abdominal pain, blood in stool,  constipation, diarrhea, heartburn and melena.  Genitourinary: Negative.   Skin: Negative.   Neurological: Negative for dizziness, sensory change, loss of consciousness and headaches.  Psychiatric/Behavioral: Positive for depression. Negative for hallucinations, memory loss, substance abuse and suicidal ideas. The patient is not nervous/anxious and does not have insomnia.     Family history- Review and unchanged  Social history- Review and unchanged  Physical Exam: BP 126/84   Pulse 72   Temp (!) 97.4 F (36.3 C)   Wt 278 lb (126.1 kg)   SpO2 98%   BMI 43.54 kg/m  Wt Readings from Last 3 Encounters:  08/13/19 278 lb (126.1 kg)  07/30/19 278 lb (126.1 kg)  07/10/19 279 lb (126.6 kg)    General Appearance: Well nourished, in no apparent distress.  Eyes: PERRLA, EOMs, conjunctiva no swelling or erythema ENT/Mouth: Ear canals clear bilaterally with no erythema, swelling, discharge.  TMs normal bilaterally with no erythema, bulging, or retractions.  Oropharynx clear and moist with no exudate, swelling, or erythema.  Dentition normal.   Neck: Supple, thyroid normal. No bruits, JVD, cervical adenopathy Respiratory: Respiratory effort normal, BS equal bilaterally without rales, rhonchi, wheezing or stridor.  Cardio: RRR without murmurs, rubs or gallops. Brisk peripheral pulses with minimal edema.  Chest: symmetric, with normal excursions Abdomen: Soft, obese, nontender, no guarding, rebound, hernias, masses, or organomegaly. Musculoskeletal: Full ROM all peripheral extremities,5/5 strength, and normal gait.  Skin: Warm, dry without rashes, lesions, ecchymosis. Neuro: A&Ox3, Cranial nerves intact, slow gait.  Overshoot of the right hand with finger to nose.  Tremor of the left hand with finger opposition>tremor of the right hand with finger opposition.    Psych: Flat affect, Insight and Judgment appropriate.     Vicie Mutters, PA-C 9:48 AM [facility]

## 2019-08-13 ENCOUNTER — Encounter: Payer: Self-pay | Admitting: Physician Assistant

## 2019-08-13 ENCOUNTER — Other Ambulatory Visit: Payer: Self-pay

## 2019-08-13 ENCOUNTER — Ambulatory Visit (INDEPENDENT_AMBULATORY_CARE_PROVIDER_SITE_OTHER): Payer: Medicare Other | Admitting: Physician Assistant

## 2019-08-13 VITALS — BP 126/84 | HR 72 | Temp 97.4°F | Wt 278.0 lb

## 2019-08-13 DIAGNOSIS — N183 Chronic kidney disease, stage 3 unspecified: Secondary | ICD-10-CM | POA: Diagnosis not present

## 2019-08-13 DIAGNOSIS — R972 Elevated prostate specific antigen [PSA]: Secondary | ICD-10-CM

## 2019-08-13 DIAGNOSIS — E785 Hyperlipidemia, unspecified: Secondary | ICD-10-CM | POA: Diagnosis not present

## 2019-08-13 DIAGNOSIS — I10 Essential (primary) hypertension: Secondary | ICD-10-CM

## 2019-08-13 DIAGNOSIS — Z79899 Other long term (current) drug therapy: Secondary | ICD-10-CM

## 2019-08-13 DIAGNOSIS — G20A1 Parkinson's disease without dyskinesia, without mention of fluctuations: Secondary | ICD-10-CM

## 2019-08-13 DIAGNOSIS — G20 Parkinson's disease: Secondary | ICD-10-CM | POA: Diagnosis not present

## 2019-08-13 DIAGNOSIS — IMO0002 Reserved for concepts with insufficient information to code with codable children: Secondary | ICD-10-CM

## 2019-08-13 DIAGNOSIS — E1365 Other specified diabetes mellitus with hyperglycemia: Secondary | ICD-10-CM

## 2019-08-13 DIAGNOSIS — M25512 Pain in left shoulder: Secondary | ICD-10-CM

## 2019-08-13 DIAGNOSIS — E1322 Other specified diabetes mellitus with diabetic chronic kidney disease: Secondary | ICD-10-CM | POA: Diagnosis not present

## 2019-08-13 DIAGNOSIS — I700 Atherosclerosis of aorta: Secondary | ICD-10-CM

## 2019-08-13 DIAGNOSIS — E1169 Type 2 diabetes mellitus with other specified complication: Secondary | ICD-10-CM

## 2019-08-13 DIAGNOSIS — Z6841 Body Mass Index (BMI) 40.0 and over, adult: Secondary | ICD-10-CM

## 2019-08-13 DIAGNOSIS — E559 Vitamin D deficiency, unspecified: Secondary | ICD-10-CM | POA: Diagnosis not present

## 2019-08-13 HISTORY — DX: Elevated prostate specific antigen (PSA): R97.20

## 2019-08-13 NOTE — Patient Instructions (Signed)
Use a dropper or use a cap to put peroxide, olive oil,mineral oil or canola oil in the effected ear- 2-3 times a week. Let it soak for 20-30 min then you can take a shower or use a baby bulb with warm water to wash out the ear wax.  Can buy debrox wax removal kit over the counter.  Do not use Qtips  General eating tips  What to Avoid . Avoid added sugars o Often added sugar can be found in processed foods such as many condiments, dry cereals, cakes, cookies, chips, crisps, crackers, candies, sweetened drinks, etc.  o Read labels and AVOID/DECREASE use of foods with the following in their ingredient list: Sugar, fructose, high fructose corn syrup, sucrose, glucose, maltose, dextrose, molasses, cane sugar, brown sugar, any type of syrup, agave nectar, etc.   . Avoid snacking in between meals- drink water or if you feel you need a snack, pick a high water content snack such as cucumbers, watermelon, or any veggie.  Marland Kitchen Avoid foods made with flour o If you are going to eat food made with flour, choose those made with whole-grains; and, minimize your consumption as much as is tolerable . Avoid processed foods o These foods are generally stocked in the middle of the grocery store.  o Focus on shopping on the perimeter of the grocery.  What to Include . Vegetables o GREEN LEAFY VEGETABLES: Kale, spinach, mustard greens, collard greens, cabbage, broccoli, etc. o OTHER: Asparagus, cauliflower, eggplant, carrots, peas, Brussel sprouts, tomatoes, bell peppers, zucchini, beets, cucumbers, etc. . Grains, seeds, and legumes o Beans: kidney beans, black eyed peas, garbanzo beans, black beans, pinto beans, etc. o Whole, unrefined grains: brown rice, barley, bulgur, oatmeal, etc. . Healthy fats  o Avoid highly processed fats such as vegetable oil o Examples of healthy fats: avocado, olives, virgin olive oil, dark chocolate (?72% Cocoa), nuts (peanuts, almonds, walnuts, cashews, pecans, etc.) o Please still do  small amount of these healthy fats, they are dense in calories.  . Low - Moderate Intake of Animal Sources of Protein o Meat sources: chicken, [place], salmon, tuna. Limit to 4 ounces of meat at one time or the size of your palm. o Consider limiting dairy sources, but when choosing dairy focus on: PLAIN [place] yogurt, cottage cheese, high-protein milk . Fruit o Choose berries      When it comes to diets, agreement about the perfect plan isn't easy to find, even among the experts. Experts at the [employer] developed an idea known as the Healthy Eating Plate. Just imagine a plate divided into logical, healthy portions.  The emphasis is on diet quality:  Load up on vegetables and fruits - one-half of your plate: Aim for color and variety, and remember that potatoes don't count.  Go for whole grains - one-quarter of your plate: Whole wheat, barley, wheat berries, quinoa, oats, brown rice, and foods made with them. If you want pasta, go with whole wheat pasta.  Protein power - one-quarter of your plate: Fish, chicken, beans, and nuts are all healthy, versatile protein sources. Limit red meat.  The diet, however, does go beyond the plate, offering a few other suggestions.  Use healthy plant oils, such as olive, canola, soy, corn, sunflower and peanut. Check the labels, and avoid partially hydrogenated oil, which have unhealthy trans fats.  If you're thirsty, drink water. Coffee and tea are good in moderation, but skip sugary drinks and limit milk and dairy products to  one or two daily servings.  The type of carbohydrate in the diet is more important than the amount. Some sources of carbohydrates, such as vegetables, fruits, whole grains, and beans--are healthier than others.  Finally, stay active.

## 2019-08-14 LAB — VITAMIN D 25 HYDROXY (VIT D DEFICIENCY, FRACTURES): Vit D, 25-Hydroxy: 91 ng/mL (ref 30–100)

## 2019-08-14 LAB — CBC WITH DIFFERENTIAL/PLATELET
Absolute Monocytes: 783 cells/uL (ref 200–950)
Basophils Absolute: 36 cells/uL (ref 0–200)
Basophils Relative: 0.4 %
Eosinophils Absolute: 146 cells/uL (ref 15–500)
Eosinophils Relative: 1.6 %
HCT: 47.5 % (ref 38.5–50.0)
Hemoglobin: 15.8 g/dL (ref 13.2–17.1)
Lymphs Abs: 2202 cells/uL (ref 850–3900)
MCH: 28 pg (ref 27.0–33.0)
MCHC: 33.3 g/dL (ref 32.0–36.0)
MCV: 84.2 fL (ref 80.0–100.0)
MPV: 10.9 fL (ref 7.5–12.5)
Monocytes Relative: 8.6 %
Neutro Abs: 5933 cells/uL (ref 1500–7800)
Neutrophils Relative %: 65.2 %
Platelets: 181 10*3/uL (ref 140–400)
RBC: 5.64 10*6/uL (ref 4.20–5.80)
RDW: 14 % (ref 11.0–15.0)
Total Lymphocyte: 24.2 %
WBC: 9.1 10*3/uL (ref 3.8–10.8)

## 2019-08-14 LAB — COMPLETE METABOLIC PANEL WITH GFR
AG Ratio: 1.3 (calc) (ref 1.0–2.5)
ALT: 17 U/L (ref 9–46)
AST: 13 U/L (ref 10–35)
Albumin: 4.4 g/dL (ref 3.6–5.1)
Alkaline phosphatase (APISO): 53 U/L (ref 35–144)
BUN: 25 mg/dL (ref 7–25)
CO2: 29 mmol/L (ref 20–32)
Calcium: 10.7 mg/dL — ABNORMAL HIGH (ref 8.6–10.3)
Chloride: 98 mmol/L (ref 98–110)
Creat: 1.12 mg/dL (ref 0.70–1.25)
GFR, Est African American: 79 mL/min/{1.73_m2} (ref >=60)
GFR, Est Non African American: 69 mL/min/{1.73_m2} (ref >=60)
Globulin: 3.3 g/dL (calc) (ref 1.9–3.7)
Glucose, Bld: 146 mg/dL — ABNORMAL HIGH (ref 65–99)
Potassium: 3.9 mmol/L (ref 3.5–5.3)
Sodium: 137 mmol/L (ref 135–146)
Total Bilirubin: 0.5 mg/dL (ref 0.2–1.2)
Total Protein: 7.7 g/dL (ref 6.1–8.1)

## 2019-08-14 LAB — LIPID PANEL
Cholesterol: 193 mg/dL (ref <200)
HDL: 36 mg/dL — ABNORMAL LOW (ref >=40)
LDL Cholesterol (Calc): 120 mg/dL (calc) — ABNORMAL HIGH
Non-HDL Cholesterol (Calc): 157 mg/dL (calc) — ABNORMAL HIGH (ref <130)
Total CHOL/HDL Ratio: 5.4 (calc) — ABNORMAL HIGH (ref <5.0)
Triglycerides: 246 mg/dL — ABNORMAL HIGH (ref <150)

## 2019-08-14 LAB — HEMOGLOBIN A1C
Hgb A1c MFr Bld: 7.3 % of total Hgb — ABNORMAL HIGH (ref <5.7)
Mean Plasma Glucose: 163 (calc)
eAG (mmol/L): 9 (calc)

## 2019-08-14 LAB — MAGNESIUM: Magnesium: 1.8 mg/dL (ref 1.5–2.5)

## 2019-08-14 LAB — PSA: PSA: 2.7 ng/mL (ref <=4.0)

## 2019-08-14 LAB — TSH: TSH: 3.6 mIU/L (ref 0.40–4.50)

## 2019-08-14 MED FILL — CARBIDOPA-LEVODOPA 25-100 T: 25-100 | 30 days supply | Qty: 180 | Fill #1

## 2019-08-15 ENCOUNTER — Encounter: Payer: Self-pay | Admitting: Physical Therapy

## 2019-08-15 ENCOUNTER — Other Ambulatory Visit: Payer: Self-pay

## 2019-08-15 ENCOUNTER — Ambulatory Visit: Payer: Medicare Other | Admitting: Physical Therapy

## 2019-08-15 DIAGNOSIS — G8929 Other chronic pain: Secondary | ICD-10-CM

## 2019-08-15 DIAGNOSIS — M25512 Pain in left shoulder: Secondary | ICD-10-CM

## 2019-08-15 NOTE — Therapy (Signed)
[facility] [address] [place], [place], [address] Phone: [phone]   Fax:  [phone]  Physical Therapy Evaluation   Patient Details  Name: Todd Rice MRN: [id number] Date of Birth: 05-Dec-1955 Referring Provider (PT): Vicie Mutters, [place]   Encounter Date: 08/15/2019  PT End of Session - 08/15/19 1449    Visit Number  1    Number of Visits  1    Date for PT Re-Evaluation  08/18/19    Authorization Type  MCR: Kx mod at 15th visit,    Progress Note Due on Visit  10    PT Start Time  [id number]    PT Stop Time  1528    PT Time Calculation (min)  43 min    Activity Tolerance  Patient tolerated treatment well    Behavior During Therapy  [facility] for tasks assessed/performed       Past Medical History:  Diagnosis Date  . Adult BMI 50.0-59.9 kg/sq m    52.77  . Anal fissure   . Anxiety   . Diabetes mellitus without complication (HCC)    borderline  . GERD (gastroesophageal reflux disease)    30 years ago, maybe from peptic ulcer  . H/O: gout    STABLE  . Hepatic steatosis 10/01/11  . History of kidney stones   . Hyperlipidemia   . Hypertension   . Kidney tumor    right upper kidney tumor, monitoring  . OSA on CPAP    [facility]  . Parkinson disease ([place]) 01/04/2016  . REM sleep behavior disorder 12/06/2016  . Right ureteral stone   . Ureteral calculus, right 06/12/2011  . Vitamin D deficiency   . Weakness     Past Surgical History:  Procedure Laterality Date  . ANAL FISSURE REPAIR  10-12-2000  . COLONOSCOPY N/A 07/01/2012   Procedure: COLONOSCOPY;  Surgeon: Lafayette Dragon, MD;  Location: [facility];  Service: Endoscopy;  Laterality: N/A;  . CYSTOSCOPY WITH RETROGRADE PYELOGRAM, URETEROSCOPY AND STENT PLACEMENT Bilateral 02/26/2013   Procedure: CYSTOSCOPY WITH RETROGRADE PYELOGRAM, URETEROSCOPY AND STENT PLACEMENT;  Surgeon: Alexis Frock, MD;  Location: [facility];  Service: Urology;  Laterality: Bilateral;  . HOLMIUM LASER  APPLICATION Bilateral [id number]   Procedure: HOLMIUM LASER APPLICATION;  Surgeon: Alexis Frock, MD;  Location: [facility];  Service: Urology;  Laterality: Bilateral;  . kidney stone removal     06/2011-stent placement  . LEFT MEDIAL FEMORAL CONDYLE DEBRIDEMENT & DRILLING/ REMOVAL LOOSE BODY  09-15-2003  . NASAL SEPTUM SURGERY  1985  . STONE EXTRACTION WITH BASKET  06/12/2011   Procedure: STONE EXTRACTION WITH BASKET;  Surgeon: Claybon Jabs, MD;  Location: [facility];  Service: Urology;  Laterality: Right;    There were no vitals filed for this visit.   Subjective Assessment - 08/15/19 1447    Subjective  pt is a 64 y.o M with CC of L shoulder pain that started following a "pop" that occured with OT therapy for parkinsons and the pain gradually worsened. He reports that he got an injection in the L shoulder on 07/30/2019. He reports limtied sroenss reaching overhead and stiffnes in the morning. pt denies any PMHx regarding the L shoulder    How long can you sit comfortably?  unlimited    How long can you stand comfortably?  unlimited    How long can you walk comfortably?  unlimited    Patient Stated Goals  N/A    Currently in Pain?  Yes  Pain Score  0-No pain   at worst 4/10   Pain Location  Shoulder    Pain Orientation  Left;Anterior    Pain Descriptors / Indicators  Sore    Pain Type  Chronic pain    Pain Onset  More than a month ago    Pain Frequency  Occasional    Aggravating Factors   getting out of bed in the morning, reaching overhead    Pain Relieving Factors  stretching, exercise         [facility] PT Assessment - 08/15/19 1455      Assessment   Medical Diagnosis  L shoulder pain    Referring Provider (PT)  Vicie Mutters, PA-C    Onset Date/Surgical Date  --   December 2020   Hand Dominance  Right    Next MD Visit  july    Prior Therapy  yes   neuro rehab for parkinson     Precautions   Precautions  None      Restrictions   Weight Bearing  Restrictions  No      Balance Screen   Has the patient fallen in the past 6 months  No    Has the patient had a decrease in activity level because of a fear of falling?   No    Is the patient reluctant to leave their home because of a fear of falling?   No      Home Social worker  Private residence    Living Arrangements  Spouse/significant other    Available Help at Discharge  Family    Type of [place] to enter    Entrance Stairs-Number of Steps  1    Entrance Stairs-Rails  None    Home Layout  Two level;Able to live on main level with bedroom/bathroom      Prior Function   Level of Independence  Independent    Vocation  Retired      Charity fundraiser Status  Within Functional Limits for tasks assessed      ROM / Strength   AROM / PROM / Strength  AROM;Strength      AROM   Overall AROM   Within functional limits for tasks performed    Overall AROM Comments  mild stiffness at the end range     AROM Assessment Site  Shoulder      Strength   Strength Assessment Site  Shoulder    Right/Left Shoulder  Right;Left    Right Shoulder Flexion  5/5    Right Shoulder Extension  5/5    Right Shoulder ABduction  5/5    Right Shoulder Internal Rotation  5/5    Right Shoulder External Rotation  5/5    Left Shoulder Flexion  5/5    Left Shoulder Extension  5/5    Left Shoulder ABduction  5/5    Left Shoulder Internal Rotation  4+/5    Left Shoulder External Rotation  5/5      Palpation   Palpation comment  mild tenderness located at the A/C joint on the L, increased tension along the L upper trap                Objective measurements completed on examination: See above findings.      [facility] Adult PT Treatment/Exercise - 08/15/19 0001      Exercises   Exercises  Shoulder  Shoulder Exercises: Seated   Other Seated Exercises  self lead distal AC joint mobs using towel 1 x 10      Shoulder Exercises:  Standing   External Rotation  Strengthening;Left;10 reps;Theraband    Theraband Level (Shoulder External Rotation)  Level 4 (Blue)    Internal Rotation  Strengthening;Left;10 reps;Theraband    Theraband Level (Shoulder Internal Rotation)  Level 4 (Blue)    Row  10 reps;Strengthening;Both   with blue theraband     Shoulder Exercises: Stretch   Other Shoulder Stretches  upper trap stretch 2 x 30 L only      Manual Therapy   Manual therapy comments  distal clavicle mobs grade III AP and inferior mobs   noted decreased end range stiffness with flexion/ abduction            PT Education - 08/15/19 1530    Education Details  evaluation findings, HEP with proper form/ rationale.    Person(s) Educated  Patient    Methods  Explanation;Verbal cues;Handout    Comprehension  Verbalized understanding;Verbal cues required                  Plan - 08/15/19 1532    Clinical Impression Statement  pt presents to OPPT with CC of L shoulder pain that started in December 2020 during OT noting a pop with activity. He recieved an injection 3/24 and notes significant improvement in symptoms and only has end range stiffness. He has functional shoulder ROM and strength, and notes mild soreness at the [facility] joint on the L that resolved with clavicle mobs. He was provided a HEP for shoulder strengthening/ stretching,  based on assessment pt demonstrates no functional need for skilled treatment and is able to perform exercises independently.    Personal Factors and Comorbidities  Comorbidity 1    Comorbidities  hx of parkinsons    Stability/Clinical Decision Making  Stable/Uncomplicated    Clinical Decision Making  Low    PT Frequency  One time visit    PT Next Visit Plan  pt only required evaluation    PT Home Exercise Plan  [id number] - shoulder internal/ external rotation, rows, upper trap stretch, distal clavicle mobs    Consulted and Agree with Plan of Care  Patient       Patient will  benefit from skilled therapeutic intervention in order to improve the following deficits and impairments:     Visit Diagnosis: Chronic left shoulder pain     Problem List Patient Active Problem List   Diagnosis Date Noted  . Elevated PSA 08/13/2019  . Hyperlipidemia associated with type 2 diabetes mellitus ([place]) 07/09/2019  . Aortic atherosclerosis ([place]) 06/10/2018  . Controlled type 2 diabetes mellitus with diabetic dermatitis, without long-term current use of insulin ([place]) 01/21/2018  . OSA treated with BiPAP 01/03/2017  . REM sleep behavior disorder 12/06/2016  . Uncontrolled secondary diabetes mellitus with stage 3 CKD (GFR 30-59) (HCC) 10/23/2016  . Medication management 10/23/2016  . Vitamin D deficiency 10/23/2016  . Parkinson disease ([place]) 01/04/2016  . Seasonal and perennial allergic rhinitis 06/21/2014  . Asthma, mild intermittent, well-controlled 06/21/2014  . Pseudomonas aeruginosa colonization 10/08/2012  . CAD (coronary artery disease) 11/15/2011  . Hypertension 11/15/2011  . Hyperlipidemia 11/15/2011  . Morbid obesity with BMI of 40.0-44.9, adult ([place]) 11/15/2011   Starr Lake PT, DPT, LAT, ATC  08/15/19  3:50 PM      [facility] [address] [place], [place], [address]  Phone: [phone]   Fax:  [phone]  Name: Todd Rice MRN: [id number] Date of Birth: 1955-10-14

## 2019-08-18 MED FILL — JANUVIA 100 MG TABLET: 100 | 30 days supply | Qty: 30 | Fill #2

## 2019-08-19 ENCOUNTER — Ambulatory Visit: Payer: 59 | Admitting: Adult Health Nurse Practitioner

## 2019-08-19 DIAGNOSIS — D4101 Neoplasm of uncertain behavior of right kidney: Secondary | ICD-10-CM | POA: Diagnosis not present

## 2019-08-19 DIAGNOSIS — K8020 Calculus of gallbladder without cholecystitis without obstruction: Secondary | ICD-10-CM | POA: Diagnosis not present

## 2019-08-19 DIAGNOSIS — C641 Malignant neoplasm of right kidney, except renal pelvis: Secondary | ICD-10-CM | POA: Diagnosis not present

## 2019-08-20 ENCOUNTER — Ambulatory Visit (INDEPENDENT_AMBULATORY_CARE_PROVIDER_SITE_OTHER): Payer: Medicare Other | Admitting: Neurology

## 2019-08-20 DIAGNOSIS — G4733 Obstructive sleep apnea (adult) (pediatric): Secondary | ICD-10-CM | POA: Diagnosis not present

## 2019-08-20 DIAGNOSIS — R063 Periodic breathing: Secondary | ICD-10-CM

## 2019-08-20 DIAGNOSIS — G20 Parkinson's disease: Secondary | ICD-10-CM

## 2019-08-20 DIAGNOSIS — G20A1 Parkinson's disease without dyskinesia, without mention of fluctuations: Secondary | ICD-10-CM

## 2019-08-20 DIAGNOSIS — I2510 Atherosclerotic heart disease of native coronary artery without angina pectoris: Secondary | ICD-10-CM

## 2019-08-20 DIAGNOSIS — G4709 Other insomnia: Secondary | ICD-10-CM

## 2019-08-20 DIAGNOSIS — Z6841 Body Mass Index (BMI) 40.0 and over, adult: Secondary | ICD-10-CM

## 2019-08-25 DIAGNOSIS — N3020 Other chronic cystitis without hematuria: Secondary | ICD-10-CM | POA: Diagnosis not present

## 2019-08-25 DIAGNOSIS — D4101 Neoplasm of uncertain behavior of right kidney: Secondary | ICD-10-CM | POA: Diagnosis not present

## 2019-08-25 DIAGNOSIS — N200 Calculus of kidney: Secondary | ICD-10-CM | POA: Diagnosis not present

## 2019-08-25 LAB — POC URINALSYSI DIPSTICK (AUTOMATED)
Bilirubin, UA: NEGATIVE
Blood, UA: NEGATIVE
Ketones, UA: NEGATIVE
Nitrite, UA: NEGATIVE
Spec Grav, UA: 1.025 (ref 1.010–1.025)
Urobilinogen, UA: 0.2 E.U./dL
pH, UA: 6 (ref 5.0–8.0)

## 2019-08-31 DIAGNOSIS — R063 Periodic breathing: Secondary | ICD-10-CM | POA: Insufficient documentation

## 2019-08-31 NOTE — Procedures (Signed)
Patient Information     First Name: Todd Last Name: Rice ID: [id number]  Birth Date: [birth year] Age: 64 Gender: Male  Referring Provider: Unk Pinto, MD BMI: 43.6 (W=278 lb, H=5' 7'')  Neck Circ.: 22   Epworth:  6/24   Sleep Study Information    Study Date: Aug 20, 2019 S/H/A Version: 001.001.001.001 / 4.1.1528 / 37  History:         Mr. Densford is a 64 year old male Parkinson's Disease patient of Dr. [name] with a concern about sleep quality, who states his ASV machine is working ok but he has noticed in last couple months a decline in his sleep quality and duration. He described experiencing 3-4 nights in a row with only 4-5 hours of sleep alternating with 1-2 nights during which he sleeps 8-9 hours. He uses a FFM and yet has oral air leaks, increasing trend.  07-30-2019: His ASV recorded an average of only 5.5 hours of sleep, ASV pressure settings for maximum pressure support at 17 cm water, minimum pressure of 10 cm water and EEP of 4 cm water.  No central apnoeic events were recorded. The ASV machine seems to often "switch off" and is almost 65 years old and he remarked that his water reservoir is always empty in AM.  Cyclic sleep pattern. Essential hypertension, shoulder injury, has been in outpatient Rehab for his PD. Patient has received both Covid shots.     Summary & Diagnosis:      Persistent, severe complex sleep apnea at AHI 52.2/h with only mild REM sleep accentuation and 25% central apneas. The patient had no prolonged sleep hypoxia during this HST, despite very frequent, albeit brief, o2 desaturations.  All sleep was recorded in supine position and associated with loud snoring.  Noted trend to bradycardia.  Recommendations:     The patient is likely to need ASV again, and a new titration may be avoided if the current settings eliminated his complex apnea so well. For now, until October, he will use the current machine and we try to improve on the interface.  I will order a  chin strap if not already purchased and he may do well with an ResMed F 30 I with nasal cradle or pillows, further eliminating air leakage.   Interpreting Physician: Larey Seat, MD            Sleep Summary  Oxygen Saturation Statistics   Start Study Time: End Study Time: Total Recording Time:  9:32:37 PM 5:18:42 AM 7 h, 46 min  Total Sleep Time % REM of Sleep Time:  6 h, 56 min 13.6    Mean: 94 Minimum: 85 Maximum: 99  Mean of Desaturations Nadirs (%):   91  Oxygen Desaturation. %:   4-9 10-20 >20 Total  Events Number Total   272  13 95.4 4.6  0 0.0  285 100.0  Oxygen Saturation: <90 <=88 <85 <80 <70  Duration (minutes): Sleep % 4.4 1.1  1.9 0.0  0.5 0.0 0.0 0.0 0.0 0.0     Respiratory Indices      Total Events REM NREM All Night  pRDI:  369  pAHI:  357 ODI:  285  :  56  % CSR: 25.1 57.5 56.4 38.4 3.4 53.8 51.9 42.4 9.0 54.3 52.5 41.9 8.2       Pulse Rate Statistics during Sleep (BPM)      Mean:  58 Minimum: 45  Maximum:  98    Indices are calculated using  technically valid sleep time of 6 h, 48 min. pRDI/pAHI are calculated using 02 desaturations ? 3%  Body Position Statistics  Position Supine Prone Right Left Non-Supine  Sleep (min) 414.1 0.0 2.0 0.0 2.0  Sleep % 99.5 0.0 0.5 0.0 0.5  pRDI 54.3 N/A N/A N/A N/A  pAHI 52.5 N/A N/A N/A N/A  ODI 41.9 N/A N/A N/A N/A     Snoring Statistics Snoring Level (dB) >40 >50 >60 >70 >80 >Threshold (45)  Sleep (min) 416.0 253.2 22.7 0.1 0.0 363.1  Sleep % 100.0 60.8 5.5 0.0 0.0 87.3    Mean: 52 dB Sleep Stages Chart                                                                                pAHI=52.5                                                                                            Mild              Moderate                    Severe                                                 5              15                    30  * Reference values are according to AASM  guidelines

## 2019-08-31 NOTE — Progress Notes (Signed)
Persistent, severe complex sleep apnea at AHI 52.2/h with only  mild REM sleep accentuation and 25% central apneas. The patient  had no prolonged sleep hypoxia during this HST, despite very  frequent, albeit brief, o2 desaturations.  All sleep was recorded in supine position and associated with  loud snoring. Noted trend to bradycardia.  Recommendations:    The patient is likely to need ASV again, and a new titration may  be avoided if the current settings eliminated his complex apnea  so well. For now, until October, he will use the current machine  and we try to improve on the interface.  I will order a chin strap if not already purchased and he may do  well with an ResMed F 30 I with nasal cradle or pillows, further  eliminating air leakage.

## 2019-08-31 NOTE — Addendum Note (Signed)
Addended by: Larey Seat on: 08/31/2019 12:49 PM   Modules accepted: Orders

## 2019-09-01 MED FILL — ALLOPURINOL 300 MG TABS: 300 | 30 days supply | Qty: 30 | Fill #3

## 2019-09-01 MED FILL — VALSARTAN-HCTZ 320-25 MG TA: 320-25 | 30 days supply | Qty: 30 | Fill #6

## 2019-09-09 ENCOUNTER — Ambulatory Visit: Payer: Medicare Other

## 2019-09-09 ENCOUNTER — Other Ambulatory Visit: Payer: Self-pay

## 2019-09-15 ENCOUNTER — Other Ambulatory Visit: Payer: Self-pay | Admitting: Neurology

## 2019-09-15 ENCOUNTER — Encounter: Payer: Self-pay | Admitting: Neurology

## 2019-09-15 ENCOUNTER — Ambulatory Visit: Payer: Medicare Other | Admitting: Neurology

## 2019-09-15 ENCOUNTER — Other Ambulatory Visit: Payer: Self-pay

## 2019-09-15 VITALS — BP 125/78 | HR 85 | Temp 97.4°F | Ht 68.0 in | Wt 274.5 lb

## 2019-09-15 DIAGNOSIS — G20 Parkinson's disease: Secondary | ICD-10-CM | POA: Diagnosis not present

## 2019-09-15 MED ORDER — CARBIDOPA-LEVODOPA 25-250 MG PO TABS: 1.0000 | ORAL_TABLET | Freq: Three times a day (TID) | ORAL | 3 refills | Status: DC

## 2019-09-15 NOTE — Patient Instructions (Signed)
We will go up on the Sinemet to 25/250 mg three times a day.  Sinemet (carbidopa) may result in confusion or hallucinations, drowsiness, nausea, or dizziness. If any significant side effects are noted, please contact our office. Sinemet may not be well absorbed when taken with high protein meals, if tolerated it is best to take 30-45 minutes before you eat.

## 2019-09-15 NOTE — Progress Notes (Signed)
Reason for visit: Parkinson's disease  Todd Rice is an 64 y.o. male  History of present illness:  [name] is a 64 year old right-handed white male with a history of Parkinson's disease and sleep apnea on CPAP.  The patient has noted a gradual change in his underlying condition.  He has had some increased tremor with the right arm.  He at times may have difficulty getting up out of a low chair.  He has hypophonia at times.  His handwriting has become more difficult, he has to print.  He has not had any falls.  He is trying to stay active.  He has had a recent injury to the left shoulder several months ago, he has gotten physical therapy for this.  He remains overweight.  He returns to this office for an evaluation.  He currently is on Mirapex taking 1 mg 3 times daily and he takes Sinemet 25/100 mg tablets, 2 tablets 3 times daily.  Past Medical History:  Diagnosis Date  . Adult BMI 50.0-59.9 kg/sq m    52.77  . Anal fissure   . Anxiety   . Diabetes mellitus without complication (HCC)    borderline  . GERD (gastroesophageal reflux disease)    30 years ago, maybe from peptic ulcer  . H/O: gout    STABLE  . Hepatic steatosis 10/01/11  . History of kidney stones   . Hyperlipidemia   . Hypertension   . Kidney tumor    right upper kidney tumor, monitoring  . OSA on CPAP    [facility]  . Parkinson disease ([place]) 01/04/2016  . REM sleep behavior disorder 12/06/2016  . Right ureteral stone   . Ureteral calculus, right 06/12/2011  . Vitamin D deficiency   . Weakness     Past Surgical History:  Procedure Laterality Date  . ANAL FISSURE REPAIR  10-12-2000  . COLONOSCOPY N/A 07/01/2012   Procedure: COLONOSCOPY;  Surgeon: Lafayette Dragon, MD;  Location: [facility];  Service: Endoscopy;  Laterality: N/A;  . CYSTOSCOPY WITH RETROGRADE PYELOGRAM, URETEROSCOPY AND STENT PLACEMENT Bilateral 02/26/2013   Procedure: CYSTOSCOPY WITH RETROGRADE PYELOGRAM, URETEROSCOPY AND STENT  PLACEMENT;  Surgeon: Alexis Frock, MD;  Location: [facility];  Service: Urology;  Laterality: Bilateral;  . HOLMIUM LASER APPLICATION Bilateral [id number]   Procedure: HOLMIUM LASER APPLICATION;  Surgeon: Alexis Frock, MD;  Location: [facility];  Service: Urology;  Laterality: Bilateral;  . kidney stone removal     06/2011-stent placement  . LEFT MEDIAL FEMORAL CONDYLE DEBRIDEMENT & DRILLING/ REMOVAL LOOSE BODY  09-15-2003  . NASAL SEPTUM SURGERY  1985  . STONE EXTRACTION WITH BASKET  06/12/2011   Procedure: STONE EXTRACTION WITH BASKET;  Surgeon: Claybon Jabs, MD;  Location: [facility];  Service: Urology;  Laterality: Right;    Family History  Problem Relation Age of Onset  . Heart disease Mother        Atrial fibrillation  . Hypertension Mother   . Hyperlipidemia Mother   . Lung cancer Father        was a smoker  . Cancer Father        lung  . Hyperlipidemia Brother   . Heart disease Maternal Aunt   . Hyperlipidemia Maternal Aunt   . Hypertension Maternal Aunt   . Stroke Maternal Aunt   . Heart disease Paternal Grandmother   . Hyperlipidemia Paternal Grandmother     Social history:  reports that he has never smoked. He has never  used smokeless tobacco. He reports that he does not drink alcohol or use drugs.    Allergies  Allergen Reactions  . Ciprofloxacin Hives  . Ceftriaxone Hives    Medications:  Prior to Admission medications   Medication Sig Start Date End Date Taking? Authorizing Provider  allopurinol (ZYLOPRIM) 300 MG tablet TAKE 1 TABLET BY MOUTH ONCE DAILY 05/28/19  Yes [name], NP  ALPRAZolam Duanne Moron) 0.5 MG tablet Take 1 tablet (0.5 mg total) by mouth 3 (three) times daily. 09/26/18  Yes Vicie Mutters, PA-C  aspirin EC 81 MG tablet Take 81 mg by mouth at bedtime.   Yes [provider]  carbidopa-levodopa (SINEMET IR) 25-100 MG tablet TAKE 2 TABLETS BY MOUTH THREE TIMES DAILY 07/09/19  Yes Suzzanne Cloud, NP  glipiZIDE (GLUCOTROL) 5  MG tablet Take 1 tablet (5 mg total) by mouth 2 (two) times daily before a meal. 10/07/18 10/07/19 Yes Vicie Mutters, PA-C  JANUVIA 100 MG tablet Take 100 mg by mouth daily. 07/22/19  Yes [provider]  metFORMIN (GLUCOPHAGE-XR) 500 MG 24 hr tablet TAKE 2 TABLETS 2 TIMES A DAY FOR DIABETES 06/27/19  Yes Unk Pinto, MD  mometasone (NASONEX) 50 MCG/ACT nasal spray Place 2 sprays into the nose daily. 07/30/19  Yes Dohmeier, Asencion Partridge, MD  pramipexole (MIRAPEX) 1 MG tablet Take 1 tablet (1 mg total) by mouth 3 (three) times daily. 03/18/19  Yes Kathrynn Ducking, MD  rosuvastatin (CRESTOR) 40 MG tablet Take 1 tablet (40 mg total) by mouth daily. 01/21/18  Yes Vicie Mutters, PA-C  valsartan-hydrochlorothiazide (DIOVAN-HCT) 320-25 MG tablet Take 1 tablet Daily for BP 03/03/19  Yes Unk Pinto, MD  VENTOLIN HFA 108 (90 Base) MCG/ACT inhaler INHALE 2 PUFFS INTO THE LUNGS EVERY 6 HOURS AS NEEDED FOR WHEEZING OR SHORTNESS OF BREATH. 05/06/18  Yes Unk Pinto, MD  vitamin C (ASCORBIC ACID) 500 MG tablet Take 1 tablet (500 mg total) by mouth 3 (three) times daily. 01/16/17  Yes [name], NP  VITAMIN D PO Take 5,000 Units by mouth 2 (two) times daily.   Yes [provider]  zolpidem (AMBIEN) 5 MG tablet Take 1 tablet (5 mg total) by mouth at bedtime as needed for sleep. 07/30/19  Yes Dohmeier, Asencion Partridge, MD  buPROPion (WELLBUTRIN XL) 150 MG 24 hr tablet Take 1 tablet (150 mg total) by mouth every morning. 04/25/18 04/25/19  Vicie Mutters, PA-C    ROS:  Out of a complete 14 system review of symptoms, the patient complains only of the following symptoms, and all other reviewed systems are negative.  Tremor   Blood pressure 125/78, pulse 85, temperature (!) 97.4 F (36.3 C), height 5\' 8"  (1.727 m), weight 274 lb 8 oz (124.5 kg), SpO2 96 %.  Physical Exam  General: The patient is alert and cooperative at the time of the examination.  The patient is markedly obese.  Skin:  No significant peripheral edema is noted.   Neurologic Exam  Mental status: The patient is alert and oriented x 3 at the time of the examination. The patient has apparent normal recent and remote memory, with an apparently normal attention span and concentration ability.   Cranial nerves: Facial symmetry is present. Speech is normal, no aphasia or dysarthria is noted. Extraocular movements are full. Visual fields are full.  Mild masking of the face is seen.  At times, the speech is hypophonic.  Motor: The patient has good strength in all 4 extremities.  Sensory examination: Soft touch sensation is  symmetric on the face, arms, and legs.  Coordination: The patient has good finger-nose-finger and heel-to-shin bilaterally.  Gait and station: The patient is able to arise from a seated position with the arms crossed.  Once up, he is able to walk well, he has good stride and good turns, decreased arm swing is seen bilaterally.  He is able to perform tandem gait.  Romberg is negative.  No drift is seen.  Reflexes: Deep tendon reflexes are symmetric.   Assessment/Plan:  1.  Parkinson's disease  The patient is had some slight increase in stiffness and right arm tremor.  We will go up on the Sinemet dose taking the 25/250 mg tablets, 1 tablet 3 times daily.  He will remain on the Mirapex at the current dose.  He will follow-up here in 6 months.  He will call for any dose adjustments.  Jill Alexanders MD 09/15/2019 7:19 AM  [facility] [address] [place], [place] [address]  Phone [phone] Fax [phone]

## 2019-09-18 LAB — HM DIABETES EYE EXAM

## 2019-10-01 ENCOUNTER — Other Ambulatory Visit: Payer: Self-pay | Admitting: Adult Health

## 2019-10-01 MED FILL — VALSARTAN-HCTZ 320-25 MG TA: 320-25 | 30 days supply | Qty: 30 | Fill #7

## 2019-10-01 MED FILL — ALLOPURINOL 300 MG TABS: 300 | 30 days supply | Qty: 30 | Fill #0

## 2019-10-01 MED FILL — metFORMIN HCL ER 500 MG TB2: 500 | 90 days supply | Qty: 360 | Fill #1

## 2019-10-15 DIAGNOSIS — G4733 Obstructive sleep apnea (adult) (pediatric): Secondary | ICD-10-CM | POA: Diagnosis not present

## 2019-10-16 ENCOUNTER — Other Ambulatory Visit: Payer: Self-pay | Admitting: Neurology

## 2019-10-16 ENCOUNTER — Other Ambulatory Visit: Payer: Self-pay

## 2019-10-16 ENCOUNTER — Ambulatory Visit: Payer: Medicare Other

## 2019-10-16 ENCOUNTER — Ambulatory Visit: Payer: Medicare Other | Admitting: Occupational Therapy

## 2019-10-16 ENCOUNTER — Ambulatory Visit: Payer: Medicare Other | Attending: Internal Medicine | Admitting: Physical Therapy

## 2019-10-16 DIAGNOSIS — R278 Other lack of coordination: Secondary | ICD-10-CM

## 2019-10-16 DIAGNOSIS — R471 Dysarthria and anarthria: Secondary | ICD-10-CM | POA: Insufficient documentation

## 2019-10-16 DIAGNOSIS — G20 Parkinson's disease: Secondary | ICD-10-CM

## 2019-10-16 DIAGNOSIS — R2689 Other abnormalities of gait and mobility: Secondary | ICD-10-CM | POA: Insufficient documentation

## 2019-10-16 NOTE — Therapy (Signed)
[facility] [address] [place], [place], [address] Phone: [phone]   Fax:  [phone]  Patient Details  Name: Todd Rice MRN: [id number] Date of Birth: 09-Nov-1955 Referring Provider:  Vicie Mutters, PA-C  Encounter Date: 10/16/2019 Occupational Therapy Parkinson's Disease Screen  Hand dominance:  RUE  9-hole peg test:    RUE  40.53 sec        LUE  37.15 sec  Box & Blocks Test:   RUE  40 blocks        LUE  48 blocks  Change in ability to perform ADLs/IADLs: LUE shoulder pain and decreased ROM    Pt would benefit from occupational therapy evaluation due to  Shoulder pain and decline in coordination     Kirat Mezquita 10/16/2019, 8:27 AM Theone Murdoch, OTR/L [phone] Phone: [phone] 4:18 PM 10/16/19 [facility] [address] [place], [place], [address] Phone: [phone]   Fax:  [phone]

## 2019-10-16 NOTE — Therapy (Signed)
[facility] [address] [place], [place], [address] Phone: [phone]   Fax:  [phone]  Patient Details  Name: Todd Rice MRN: [id number] Date of Birth: 05-Jun-1955 Referring Provider:  Neldon Newport.  Encounter Date: 10/16/2019  Speech Therapy Parkinson's Disease Screen   Decibel Level today: low 70sdB  (WNL=70-72 dB) with sound level meter 30cm away from pt's mouth. Pt's conversational volume has remained WNL since last treatment course.  Pt has not experienced difficulty in swallowing warranting further evaluation.  Pt does does not require speech therapy services at this time. Recommend ST screen in another 6-9 months    [facility] ,[place], CCC-SLP  10/16/2019, 8:44 AM  [facility] [address] [place], [place], [address] Phone: [phone]   Fax:  [phone]

## 2019-10-16 NOTE — Therapy (Signed)
[facility] [address] [place], [place], [address] Phone: [phone]   Fax:  [phone]  Patient Details  Name: Todd Rice MRN: [id number] Date of Birth: 07-Sep-1955 Referring Provider:  Unk Pinto, MD  Encounter Date: 10/16/2019   Physical Therapy Parkinson's Disease Screen   Timed Up and Go test: 11.56 sec  10 meter walk test: 9 seconds (3.64 ft/sec)  5 time sit to stand test: 12.25 sec   Patient does not require Physical Therapy services at this time.  Recommend Physical Therapy screen in 6-8 months.    Todd Rice W. 10/16/2019, 8:00 AM  Frazier Butt., PT   [facility] [address] [place], [place], [address] Phone: [phone]   Fax:  [phone]

## 2019-11-03 MED FILL — VALSARTAN-HCTZ 320-25 MG TA: 320-25 | 30 days supply | Qty: 30 | Fill #8

## 2019-11-03 MED FILL — ALLOPURINOL 300 MG TABS: 300 | 30 days supply | Qty: 30 | Fill #1

## 2019-11-04 ENCOUNTER — Encounter: Payer: Self-pay | Admitting: Neurology

## 2019-11-11 ENCOUNTER — Other Ambulatory Visit: Payer: Self-pay

## 2019-11-11 ENCOUNTER — Ambulatory Visit: Payer: Medicare Other | Attending: Physician Assistant | Admitting: Occupational Therapy

## 2019-11-11 ENCOUNTER — Encounter: Payer: Self-pay | Admitting: Occupational Therapy

## 2019-11-11 DIAGNOSIS — R2681 Unsteadiness on feet: Secondary | ICD-10-CM | POA: Insufficient documentation

## 2019-11-11 DIAGNOSIS — R278 Other lack of coordination: Secondary | ICD-10-CM | POA: Insufficient documentation

## 2019-11-11 DIAGNOSIS — M25512 Pain in left shoulder: Secondary | ICD-10-CM | POA: Diagnosis not present

## 2019-11-11 DIAGNOSIS — R251 Tremor, unspecified: Secondary | ICD-10-CM | POA: Insufficient documentation

## 2019-11-11 DIAGNOSIS — R293 Abnormal posture: Secondary | ICD-10-CM | POA: Insufficient documentation

## 2019-11-11 DIAGNOSIS — R29898 Other symptoms and signs involving the musculoskeletal system: Secondary | ICD-10-CM | POA: Insufficient documentation

## 2019-11-11 DIAGNOSIS — R2689 Other abnormalities of gait and mobility: Secondary | ICD-10-CM | POA: Insufficient documentation

## 2019-11-11 DIAGNOSIS — F325 Major depressive disorder, single episode, in full remission: Secondary | ICD-10-CM | POA: Insufficient documentation

## 2019-11-11 DIAGNOSIS — M25612 Stiffness of left shoulder, not elsewhere classified: Secondary | ICD-10-CM | POA: Insufficient documentation

## 2019-11-11 DIAGNOSIS — R29818 Other symptoms and signs involving the nervous system: Secondary | ICD-10-CM | POA: Diagnosis not present

## 2019-11-11 DIAGNOSIS — G8929 Other chronic pain: Secondary | ICD-10-CM | POA: Insufficient documentation

## 2019-11-11 NOTE — Therapy (Signed)
[facility] [address] [place], [place], [address] Phone: [phone]   Fax:  [phone]  Occupational Therapy Evaluation  Patient Details  Name: Todd Rice MRN: [id number] Date of Birth: April 04, 1956 Referring Provider (OT): Dr. Margette Fast   Encounter Date: 11/11/2019   OT End of Session - 11/11/19 0843    Visit Number 1    Number of Visits 10    Date for OT Re-Evaluation 12/11/19    Authorization Type [employer] [place] 2021, no deductible, VL: [place], no auth    OT Start Time 0721    OT Stop Time 0801    OT Time Calculation (min) 40 min    Activity Tolerance Patient tolerated treatment well    Behavior During Therapy [facility] for tasks assessed/performed           Past Medical History:  Diagnosis Date  . Adult BMI 50.0-59.9 kg/sq m    52.77  . Anal fissure   . Anxiety   . Diabetes mellitus without complication (HCC)    borderline  . GERD (gastroesophageal reflux disease)    30 years ago, maybe from peptic ulcer  . H/O: gout    STABLE  . Hepatic steatosis 10/01/11  . History of kidney stones   . Hyperlipidemia   . Hypertension   . Kidney tumor    right upper kidney tumor, monitoring  . OSA on CPAP    [facility]  . Parkinson disease ([place]) 01/04/2016  . REM sleep behavior disorder 12/06/2016  . Right ureteral stone   . Ureteral calculus, right 06/12/2011  . Vitamin D deficiency   . Weakness     Past Surgical History:  Procedure Laterality Date  . ANAL FISSURE REPAIR  10-12-2000  . COLONOSCOPY N/A 07/01/2012   Procedure: COLONOSCOPY;  Surgeon: Lafayette Dragon, MD;  Location: [facility];  Service: Endoscopy;  Laterality: N/A;  . CYSTOSCOPY WITH RETROGRADE PYELOGRAM, URETEROSCOPY AND STENT PLACEMENT Bilateral 02/26/2013   Procedure: CYSTOSCOPY WITH RETROGRADE PYELOGRAM, URETEROSCOPY AND STENT PLACEMENT;  Surgeon: Alexis Frock, MD;  Location: [facility];  Service: Urology;  Laterality: Bilateral;  . HOLMIUM LASER  APPLICATION Bilateral 67/54/4920   Procedure: HOLMIUM LASER APPLICATION;  Surgeon: Alexis Frock, MD;  Location: [facility];  Service: Urology;  Laterality: Bilateral;  . kidney stone removal     06/2011-stent placement  . LEFT MEDIAL FEMORAL CONDYLE DEBRIDEMENT & DRILLING/ REMOVAL LOOSE BODY  09-15-2003  . NASAL SEPTUM SURGERY  1985  . STONE EXTRACTION WITH BASKET  06/12/2011   Procedure: STONE EXTRACTION WITH BASKET;  Surgeon: Claybon Jabs, MD;  Location[facility];  Service: Urology;  Laterality: Right;    There were no vitals filed for this visit.   Subjective Assessment - 11/11/19 0725    Subjective  pt reports that he has been having fluctuating L shoulder pain since December.  Pt has had 1 cortisone injection and will see Ortho MD (Dr. Ninfa Linden) again next Wednesday.    Pertinent History Parkinson's Disease.  PMH:CAD, HTN, HDL, obesity, asthma, DM, stage 3 CKD, vitamin D deficiency, REM sleep disorder, OSA, aortic atherosclerosis, hx of gout, GERD, hx of anxiety    Patient Stated Goals improve L shoulder pain, use hands to pick up things easier, keep limber, move neck to L side, cut up meat    Currently in Pain? Yes    Pain Score 4     Pain Location Shoulder    Pain Orientation Left  Pain Descriptors / Indicators Aching;Burning    Pain Type Chronic pain    Pain Onset More than a month ago    Pain Frequency Intermittent    Aggravating Factors  raising arm    Pain Relieving Factors rest             [facility] OT Assessment - 11/11/19 0001      Assessment   Medical Diagnosis Parkinson's Disease     Referring Provider (OT) Dr. Margette Fast    Onset Date/Surgical Date 10/16/19   PD screen date   Hand Dominance Right    Prior Therapy last OT December 2020      Precautions   Precautions Fall      Balance Screen   Has the patient fallen in the past 6 months No      Home  Environment   Family/patient expects to be discharged to: Private residence    Lives  With Spouse      Prior Function   Level of [place] Retired      ADL   Eating/Feeding --   min A cutting up food intermittently   Grooming Modified independent    Upper Body Bathing Modified independent    Lower Body Bathing Modified independent    Upper Body Dressing Increased time    Lower Body Dressing Increased time   stand to bring foot up to tie, has Audiological scientist Modified independent    Toileting - Clothing Manipulation Modified independent    Tub/Shower Transfer Modified independent    Transfers/Ambulation Related to ADL's min difficulty from sofa, turning in bed    ADL comments Functional Tasks Recording Form:  Pt reports mod difficulty using R hand to pick up items, min difficulty to pick up items with L hand, keeping limber and turning head as somewhat difficullt, and very difficult to cut meat      IADL   Prior Level of Function Shopping wife performs most or performs with wife    Light Housekeeping --   performs light tasks   Prior Level of Function Meal Prep pt able to do simple meal prep    Programmer, applications own vehicle   feels like he goes closer to the L   Medication Management Is responsible for taking medication in correct dosages at correct time    Prior Level of Function Financial Management wife has always performed       Mobility   Mobility Status Independent    Mobility Status Comments uses UE support when available      Written Expression   Dominant Hand Right    Handwriting 100% legible   mod micrographia     Cognition   Overall Cognitive Status Impaired/Different from baseline    Memory Impaired    Memory Impairment --   slower recall of short term memory per pt    Bradyphrenia Yes    Cognition Comments pt reports min difficulty with word finding       Observation/Other Assessments   Other Surveys  Select    Physical Performance Test   Yes    Simulated Eating Time (seconds) 15.97sec, holds spoon  at the ennd    Donning Doffing Jacket Time (seconds) 17.94sec with L shoulder pain    Donning Doffing Jacket Comments 3 button/unbutton in 37.03sec      Posture/Postural Control   Posture/Postural Control Postural limitations    Postural Limitations Rounded Shoulders;Forward head  Coordination   9 Hole Peg Test Right;Left    Right 9 Hole Peg Test 40.78    Left 9 Hole Peg Test 33.60    Box and Blocks R-45 blocks, L-48blocks      Tone   Assessment Location Right Upper Extremity;Left Upper Extremity      ROM / Strength   AROM / PROM / Strength AROM      AROM   Overall AROM  Deficits    Overall AROM Comments R shoulder flex 145*, L shoulder shoulder 135* with good elbow ext (3-4/10 pain), abduction 150*       RUE Tone   RUE Tone Moderate      LUE Tone   LUE Tone Mild                           OT Education - 11/11/19 0841    Education Details OT Eval results/POC; proper positioning of L shoulder to decr pain/risk of injury (thumb up, elbow down/straight) with reach    Person(s) Educated Patient    Methods Explanation;Demonstration    Comprehension Verbalized understanding;Returned demonstration               OT Long Term Goals - 11/11/19 0859      OT LONG TERM GOAL #1   Title Pt will be independent with updated PD-specific HEP. --check LTGs 12/12/19    Time 5    Period Weeks    Status New      OT LONG TERM GOAL #2   Title Pt will consistently demo at least 140* L shoulder flex with pain less than or equal to 2/10 without cues for proper positioning.    Baseline 135* with 3/10 pain    Time 5    Period Weeks    Status New      OT LONG TERM GOAL #3   Title Pt will improve R hand coordination for ADLs as shown by improving time on 9-hole peg test 4 sec.    Baseline 40.78sec    Time 5    Period Weeks    Status New      OT LONG TERM GOAL #4   Title Pt will verbalize understanding of adapted strategies for increased ease and independence wtih  ADLS/ IADLs.    Time 5    Period Weeks    Status New      OT LONG TERM GOAL #5   Title Pt will report cutting up food as "moderately difficult" using strategies.    Baseline "very difficult"    Time 5    Period Weeks    Status New                 Plan - 11/11/19 0851    Clinical Impression Statement Pt is a 64 y.o. male with Parkinson's disease who returns to occupational therapy due to L shoulder pain and decline in coordination (screen completed 10/16/19).  Pt last seen for treatment 12/20 and is familiar to this therapist.  Pt with PMH that includes:  CAD, HTN, HDL, obesity, asthma, DM, stage 3 CKD, vitamin D deficiency, REM sleep disorder, OSA, aortic atherosclerosis, hx of gout, GERD, hx of anxiety.  Pt is retired and lives with wife.  Pt presents today with decr ROM, decr coordination, rigidity, tremor, abnormal posture, decr balance for ADLs, L shoulder pain, mild cognitive deficits, decr functional mobility, and bradykinesia.  Pt would benefit from occupational therapy to address  these deficits for improved UE functional use and inncr performance with ADLs/IADLs.    OT Occupational Profile and History Detailed Assessment- Review of Records and additional review of physical, cognitive, psychosocial history related to current functional performance    Occupational performance deficits (Please refer to evaluation for details): ADL's;IADL's;Leisure    Body Structure / Function / Physical Skills ADL;Balance;IADL;ROM;Improper spinal/pelvic alignment;Mobility;Tone;UE functional use;Decreased knowledge of use of DME;Pain;Coordination;FMC   bradykinesia   Cognitive Skills Memory    Rehab Potential Good    Clinical Decision Making Several treatment options, min-mod task modification necessary    Comorbidities Affecting Occupational Performance: May have comorbidities impacting occupational performance    Modification or Assistance to Complete Evaluation  Min-Moderate modification of tasks  or assist with assess necessary to complete eval    OT Frequency 2x / week    OT Duration --   5 weeks   OT Treatment/Interventions Self-care/ADL training;DME and/or AE instruction;Balance training;Therapeutic activities;Aquatic Therapy;Therapeutic exercise;Cognitive remediation/compensation;Passive range of motion;Functional Mobility Training;Neuromuscular education;Manual Therapy;Patient/family education    Plan PWR! supine, functional reach    Consulted and Agree with Plan of Care Patient           Patient will benefit from skilled therapeutic intervention in order to improve the following deficits and impairments:   Body Structure / Function / Physical Skills: ADL, Balance, IADL, ROM, Improper spinal/pelvic alignment, Mobility, Tone, UE functional use, Decreased knowledge of use of DME, Pain, Coordination, FMC (bradykinesia) Cognitive Skills: Memory     Visit Diagnosis: Other lack of coordination  Other symptoms and signs involving the musculoskeletal system  Abnormal posture  Other symptoms and signs involving the nervous system  Chronic left shoulder pain  Unsteadiness on feet  Tremor  Stiffness of left shoulder, not elsewhere classified  Other abnormalities of gait and mobility    Problem List Patient Active Problem List   Diagnosis Date Noted  . Central sleep apnea due to Cheyne-Stokes respiration 08/31/2019  . Elevated PSA 08/13/2019  . Hyperlipidemia associated with type 2 diabetes mellitus ([place]) 07/09/2019  . Aortic atherosclerosis ([place]) 06/10/2018  . Controlled type 2 diabetes mellitus with diabetic dermatitis, without long-term current use of insulin ([place]) 01/21/2018  . OSA treated with BiPAP 01/03/2017  . REM sleep behavior disorder 12/06/2016  . Uncontrolled secondary diabetes mellitus with stage 3 CKD (GFR 30-59) (HCC) 10/23/2016  . Medication management 10/23/2016  . Vitamin D deficiency 10/23/2016  . Parkinson disease ([place]) 01/04/2016  . Seasonal  and perennial allergic rhinitis 06/21/2014  . Asthma, mild intermittent, well-controlled 06/21/2014  . Pseudomonas aeruginosa colonization 10/08/2012  . CAD (coronary artery disease) 11/15/2011  . Hypertension 11/15/2011  . Hyperlipidemia 11/15/2011  . Morbid obesity with BMI of 40.0-44.9, adult ([place]) 11/15/2011    [facility] 11/11/2019, 9:11 AM  [facility] [address] [place], [place], [address] Phone: [phone]   Fax:  [phone]  Name: Todd Rice MRN: [id number] Date of Birth: 10/26/1955   Vianne Bulls, OTR/L [facility] [address] [place], [place]  [address] [phone] phone [phone] 11/11/19 9:11 AM

## 2019-11-11 NOTE — Progress Notes (Signed)
Assessment and Plan:   Uncontrolled secondary diabetes mellitus with stage 3 CKD (GFR 30-59) (HCC) -     Hemoglobin A1c - follow up 3 months for progress monitoring - increase veggies, decrease carbs - long discussion about weight loss, diet, and exercise  Aortic atherosclerosis (HCC) Control blood pressure, cholesterol, glucose, increase exercise.   Hyperlipidemia associated with type 2 diabetes mellitus ([place]) Discussed general issues about diabetes pathophysiology and management., Educational material distributed., Suggested low cholesterol diet., Encouraged aerobic exercise., Discussed foot care., Reminded to get yearly retinal exam.  Hypertension:  -Continue medication,  -monitor blood pressure at home.  -Continue DASH diet.   -Reminder to go to the ER if any CP, SOB, nausea, dizziness, severe HA, changes vision/speech, left arm numbness and tingling, and jaw pain.  Cholesterol: -Continue diet and exercise.  -Check cholesterol.   Parkinsons -cont medications -followed by neuro  Depression - continue wellbutrin stress management techniques discussed, increase water, good sleep hygiene discussed, increase exercise, and increase veggies.   Morbid Obesity - follow up 3 months for progress monitoring - increase veggies, decrease carbs - long discussion about weight loss, diet, and exercise   Continue diet and meds as discussed. Further disposition pending results of labs. Future Appointments  Date Time Provider [place]  11/18/2019  8:45 AM Vianne Bulls D, OT [facility]  11/19/2019  2:15 PM Pete Pelt, PA-C [facility] None  11/20/2019  8:00 AM Vianne Bulls D, OT [facility]  11/25/2019  8:00 AM Delton Prairie, OT [facility]  11/27/2019  8:45 AM Delton Prairie, OT [facility]  12/02/2019  8:45 AM Delton Prairie, OT [facility]  12/04/2019  8:45 AM Hulda Marin, OT [facility]  12/09/2019  7:15 AM Delton Prairie, OT [facility]   12/11/2019  7:15 AM Delton Prairie, OT [facility]  02/17/2020  9:00 AM Vicie Mutters, PA-C [facility] None  03/29/2020  7:30 AM Kathrynn Ducking, MD [facility] None  07/15/2020 10:00 AM Vicie Mutters, PA-C [facility] None    HPI 64 y.o. male  presents for 3 month follow up with hypertension, hyperlipidemia, prediabetes and vitamin D.  Patient reports that he has been following with neurology and is starting to do OT again for his parkinsons.  He reports that his right hand has still been very difficult.  He reports that he has been taking the mirapex and sinemet. Follows with Dr. Benson [place].  He is on a BiPAP machine, uses regularly but has not been functioning, can not get a new CPAP until Oct.   BMI is Body mass index is 42.36 kg/m., he is working on diet and exercise. Wt Readings from Last 3 Encounters:  11/12/19 278 lb 9.6 oz (126.4 kg)  09/15/19 274 lb 8 oz (124.5 kg)  08/13/19 278 lb (126.1 kg)    His blood pressure has been controlled at home, today their BP is BP: 132/80.   He does not workout. He denies chest pain, shortness of breath, dizziness.   He is on cholesterol medication and denies myalgias. His cholesterol is not at goal, he is on crestor but states does not remember to take it. The cholesterol last visit was:   Lab Results  Component Value Date   CHOL 193 08/13/2019   HDL 36 (L) 08/13/2019   LDLCALC 120 (H) 08/13/2019   TRIG 246 (H) 08/13/2019   CHOLHDL 5.4 (H) 08/13/2019   He has been working on diet and exercise for Diabetes  with  diabetic chronic kidney disease he is on ARB With hyperlipidemia on crestor  he is on bASA He is on metformin 1000mg  BID Started back on the [place] 100mg  daily (cost 47 30 days) Glipizide 5 mg he is not taking it, trying januiva Sugars in the AM highest is 150- normally in 130's.  he will eat twice a day, lunch will just have fruit, dinner is largest meal of the day.  denies paresthesia of the feet, polydipsia, polyuria and  visual disturbances. Last A1C was:  Lab Results  Component Value Date   HGBA1C 7.3 (H) 08/13/2019   Lab Results  Component Value Date   GFRNONAA 69 08/13/2019   Patient is on Vitamin D supplement.  Lab Results  Component Value Date   VD25OH 91 08/13/2019      Current Medications:   Current Outpatient Medications (Endocrine & Metabolic):  Marland Kitchen  JANUVIA 100 MG tablet, Take 100 mg by mouth daily. .  metFORMIN (GLUCOPHAGE-XR) 500 MG 24 hr tablet, TAKE 2 TABLETS 2 TIMES A DAY FOR DIABETES .  glipiZIDE (GLUCOTROL) 5 MG tablet, Take 1 tablet (5 mg total) by mouth 2 (two) times daily before a meal.  Current Outpatient Medications (Cardiovascular):  .  rosuvastatin (CRESTOR) 40 MG tablet, Take 1 tablet (40 mg total) by mouth daily. .  valsartan-hydrochlorothiazide (DIOVAN-HCT) 320-25 MG tablet, Take 1 tablet Daily for BP  Current Outpatient Medications (Respiratory):  .  mometasone (NASONEX) 50 MCG/ACT nasal spray, Place 2 sprays into the nose daily. .  VENTOLIN HFA 108 (90 Base) MCG/ACT inhaler, INHALE 2 PUFFS INTO THE LUNGS EVERY 6 HOURS AS NEEDED FOR WHEEZING OR SHORTNESS OF BREATH.  Current Outpatient Medications (Analgesics):  .  allopurinol (ZYLOPRIM) 300 MG tablet, TAKE 1 TABLET BY MOUTH ONCE DAILY .  aspirin EC 81 MG tablet, Take 81 mg by mouth at bedtime.   Current Outpatient Medications (Other):  Marland Kitchen  ALPRAZolam (XANAX) 0.5 MG tablet, Take 1 tablet (0.5 mg total) by mouth 3 (three) times daily. .  carbidopa-levodopa (SINEMET) 25-250 MG tablet, Take 1 tablet by mouth 3 (three) times daily. .  pramipexole (MIRAPEX) 1 MG tablet, Take 1 tablet (1 mg total) by mouth 3 (three) times daily. .  vitamin C (ASCORBIC ACID) 500 MG tablet, Take 1 tablet (500 mg total) by mouth 3 (three) times daily. Marland Kitchen  VITAMIN D PO, Take 5,000 Units by mouth 2 (two) times daily. Marland Kitchen  zolpidem (AMBIEN) 5 MG tablet, Take 1 tablet (5 mg total) by mouth at bedtime as needed for sleep. Marland Kitchen  buPROPion (WELLBUTRIN XL)  150 MG 24 hr tablet, Take 1 tablet (150 mg total) by mouth every morning.  Medical History:  Past Medical History:  Diagnosis Date  . Adult BMI 50.0-59.9 kg/sq m    52.77  . Anal fissure   . Anxiety   . Diabetes mellitus without complication (HCC)    borderline  . GERD (gastroesophageal reflux disease)    30 years ago, maybe from peptic ulcer  . H/O: gout    STABLE  . Hepatic steatosis 10/01/11  . History of kidney stones   . Hyperlipidemia   . Hypertension   . Kidney tumor    right upper kidney tumor, monitoring  . OSA on CPAP    [facility]  . Parkinson disease ([place]) 01/04/2016  . REM sleep behavior disorder 12/06/2016  . Right ureteral stone   . Ureteral calculus, right 06/12/2011  . Vitamin D deficiency   . Weakness  Allergies:  Allergies  Allergen Reactions  . Ciprofloxacin Hives  . Ceftriaxone Hives     Review of Systems:  Review of Systems  Constitutional: Negative for chills, fever and malaise/fatigue.  HENT: Negative for congestion, ear pain and sore throat.   Eyes: Negative.   Respiratory: Negative for cough, shortness of breath and wheezing.   Cardiovascular: Negative for chest pain, palpitations and leg swelling.  Gastrointestinal: Negative for abdominal pain, blood in stool, constipation, diarrhea, heartburn and melena.  Genitourinary: Negative.   Skin: Negative.   Neurological: Negative for dizziness, sensory change, loss of consciousness and headaches.  Psychiatric/Behavioral: Positive for depression. Negative for hallucinations, memory loss, substance abuse and suicidal ideas. The patient is not nervous/anxious and does not have insomnia.     Family history- Review and unchanged  Social history- Review and unchanged  Physical Exam: BP 132/80   Pulse 81   Temp (!) 97.2 F (36.2 C)   Resp 16   Ht 5\' 8"  (1.727 m)   Wt 278 lb 9.6 oz (126.4 kg)   SpO2 98%   BMI 42.36 kg/m  Wt Readings from Last 3 Encounters:  11/12/19 278 lb 9.6 oz (126.4  kg)  09/15/19 274 lb 8 oz (124.5 kg)  08/13/19 278 lb (126.1 kg)    General Appearance: Well nourished, in no apparent distress.  Eyes: PERRLA, EOMs, conjunctiva no swelling or erythema ENT/Mouth: Ear canals clear bilaterally with no erythema, swelling, discharge.  TMs normal bilaterally with no erythema, bulging, or retractions.  Oropharynx clear and moist with no exudate, swelling, or erythema.  Dentition normal.   Neck: Supple, thyroid normal. No bruits, JVD, cervical adenopathy Respiratory: Respiratory effort normal, BS equal bilaterally without rales, rhonchi, wheezing or stridor.  Cardio: RRR without murmurs, rubs or gallops. Brisk peripheral pulses with minimal edema.  Chest: symmetric, with normal excursions Abdomen: Soft, obese, nontender, no guarding, rebound, hernias, masses, or organomegaly. Musculoskeletal: Full ROM all peripheral extremities,5/5 strength, and normal gait.  Skin: Warm, dry without rashes, lesions, ecchymosis. Neuro: A&Ox3, Cranial nerves intact, slow gait.  Overshoot of the right hand with finger to nose.  Tremor of the left hand with finger opposition>tremor of the right hand with finger opposition.    Psych: Flat affect, Insight and Judgment appropriate.     Vicie Mutters, PA-C 10:08 AM [facility]

## 2019-11-12 ENCOUNTER — Other Ambulatory Visit: Payer: Self-pay

## 2019-11-12 ENCOUNTER — Encounter: Payer: Self-pay | Admitting: Physician Assistant

## 2019-11-12 ENCOUNTER — Ambulatory Visit (INDEPENDENT_AMBULATORY_CARE_PROVIDER_SITE_OTHER): Payer: Medicare Other | Admitting: Physician Assistant

## 2019-11-12 DIAGNOSIS — E1322 Other specified diabetes mellitus with diabetic chronic kidney disease: Secondary | ICD-10-CM | POA: Diagnosis not present

## 2019-11-12 DIAGNOSIS — I700 Atherosclerosis of aorta: Secondary | ICD-10-CM

## 2019-11-12 DIAGNOSIS — F3341 Major depressive disorder, recurrent, in partial remission: Secondary | ICD-10-CM

## 2019-11-12 DIAGNOSIS — E11620 Type 2 diabetes mellitus with diabetic dermatitis: Secondary | ICD-10-CM

## 2019-11-12 DIAGNOSIS — E785 Hyperlipidemia, unspecified: Secondary | ICD-10-CM | POA: Diagnosis not present

## 2019-11-12 DIAGNOSIS — G20 Parkinson's disease: Secondary | ICD-10-CM

## 2019-11-12 DIAGNOSIS — E1365 Other specified diabetes mellitus with hyperglycemia: Secondary | ICD-10-CM

## 2019-11-12 DIAGNOSIS — I10 Essential (primary) hypertension: Secondary | ICD-10-CM

## 2019-11-12 DIAGNOSIS — N183 Chronic kidney disease, stage 3 unspecified: Secondary | ICD-10-CM

## 2019-11-12 DIAGNOSIS — IMO0002 Reserved for concepts with insufficient information to code with codable children: Secondary | ICD-10-CM

## 2019-11-12 DIAGNOSIS — E1169 Type 2 diabetes mellitus with other specified complication: Secondary | ICD-10-CM

## 2019-11-12 DIAGNOSIS — Z6841 Body Mass Index (BMI) 40.0 and over, adult: Secondary | ICD-10-CM

## 2019-11-12 MED ORDER — ROSUVASTATIN CALCIUM 40 MG PO TABS: 40.0000 mg | ORAL_TABLET | Freq: Every day | ORAL | 1 refills | Status: DC

## 2019-11-12 MED FILL — ROSUVASTATIN CALCIUM 40 MG: 40 | 90 days supply | Qty: 90 | Fill #0

## 2019-11-12 NOTE — Patient Instructions (Addendum)
Crestor can take in the morning- taking it consistently is the best.   General eating tips  What to Avoid . Avoid added sugars o Often added sugar can be found in processed foods such as many condiments, dry cereals, cakes, cookies, chips, crisps, crackers, candies, sweetened drinks, etc.  o Read labels and AVOID/DECREASE use of foods with the following in their ingredient list: Sugar, fructose, high fructose corn syrup, sucrose, glucose, maltose, dextrose, molasses, cane sugar, brown sugar, any type of syrup, agave nectar, etc.   . Avoid snacking in between meals- drink water or if you feel you need a snack, pick a high water content snack such as cucumbers, watermelon, or any veggie.  Marland Kitchen Avoid foods made with flour o If you are going to eat food made with flour, choose those made with whole-grains; and, minimize your consumption as much as is tolerable . Avoid processed foods o These foods are generally stocked in the middle of the grocery store.  o Focus on shopping on the perimeter of the grocery.  What to Include . Vegetables o GREEN LEAFY VEGETABLES: Kale, spinach, mustard greens, collard greens, cabbage, broccoli, etc. o OTHER: Asparagus, cauliflower, eggplant, carrots, peas, Brussel sprouts, tomatoes, bell peppers, zucchini, beets, cucumbers, etc. . Grains, seeds, and legumes o Beans: kidney beans, black eyed peas, garbanzo beans, black beans, pinto beans, etc. o Whole, unrefined grains: brown rice, barley, bulgur, oatmeal, etc. . Healthy fats  o Avoid highly processed fats such as vegetable oil o Examples of healthy fats: avocado, olives, virgin olive oil, dark chocolate (?72% Cocoa), nuts (peanuts, almonds, walnuts, cashews, pecans, etc.) o Please still do small amount of these healthy fats, they are dense in calories.  . Low - Moderate Intake of Animal Sources of Protein o Meat sources: chicken, [place], salmon, tuna. Limit to 4 ounces of meat at one time or the size of your  palm. o Consider limiting dairy sources, but when choosing dairy focus on: PLAIN [place] yogurt, cottage cheese, high-protein milk . Fruit o Choose berries      Bad carbs also include fruit juice, alcohol, and sweet tea. These are empty calories that do not signal to your brain that you are full.   Please remember the good carbs are still carbs which convert into sugar. So please measure them out no more than 1/2-1 cup of rice, oatmeal, pasta, and beans  Veggies are however free foods! Pile them on.   Not all fruit is created equal. Please see the list below, the fruit at the bottom is higher in sugars than the fruit at the top. Please avoid all dried fruits.

## 2019-11-13 LAB — CBC WITH DIFFERENTIAL/PLATELET
Absolute Monocytes: 660 cells/uL (ref 200–950)
Basophils Absolute: 26 cells/uL (ref 0–200)
Basophils Relative: 0.3 %
Eosinophils Absolute: 88 cells/uL (ref 15–500)
Eosinophils Relative: 1 %
HCT: 46.5 % (ref 38.5–50.0)
Hemoglobin: 15.7 g/dL (ref 13.2–17.1)
Lymphs Abs: 1998 cells/uL (ref 850–3900)
MCH: 28.6 pg (ref 27.0–33.0)
MCHC: 33.8 g/dL (ref 32.0–36.0)
MCV: 84.7 fL (ref 80.0–100.0)
MPV: 10.7 fL (ref 7.5–12.5)
Monocytes Relative: 7.5 %
Neutro Abs: 6028 cells/uL (ref 1500–7800)
Neutrophils Relative %: 68.5 %
Platelets: 188 10*3/uL (ref 140–400)
RBC: 5.49 10*6/uL (ref 4.20–5.80)
RDW: 14.4 % (ref 11.0–15.0)
Total Lymphocyte: 22.7 %
WBC: 8.8 10*3/uL (ref 3.8–10.8)

## 2019-11-13 LAB — COMPLETE METABOLIC PANEL WITH GFR
AG Ratio: 1.2 (calc) (ref 1.0–2.5)
ALT: 18 U/L (ref 9–46)
AST: 17 U/L (ref 10–35)
Albumin: 4.1 g/dL (ref 3.6–5.1)
Alkaline phosphatase (APISO): 51 U/L (ref 35–144)
BUN: 20 mg/dL (ref 7–25)
CO2: 29 mmol/L (ref 20–32)
Calcium: 10.4 mg/dL — ABNORMAL HIGH (ref 8.6–10.3)
Chloride: 97 mmol/L — ABNORMAL LOW (ref 98–110)
Creat: 1.24 mg/dL (ref 0.70–1.25)
GFR, Est African American: 70 mL/min/{1.73_m2} (ref >=60)
GFR, Est Non African American: 61 mL/min/{1.73_m2} (ref >=60)
Globulin: 3.3 g/dL (calc) (ref 1.9–3.7)
Glucose, Bld: 176 mg/dL — ABNORMAL HIGH (ref 65–99)
Potassium: 4.2 mmol/L (ref 3.5–5.3)
Sodium: 135 mmol/L (ref 135–146)
Total Bilirubin: 0.5 mg/dL (ref 0.2–1.2)
Total Protein: 7.4 g/dL (ref 6.1–8.1)

## 2019-11-13 LAB — HEMOGLOBIN A1C
Hgb A1c MFr Bld: 7 % of total Hgb — ABNORMAL HIGH (ref <5.7)
Mean Plasma Glucose: 154 (calc)
eAG (mmol/L): 8.5 (calc)

## 2019-11-13 LAB — TSH: TSH: 3.74 mIU/L (ref 0.40–4.50)

## 2019-11-13 LAB — LIPID PANEL
Cholesterol: 183 mg/dL (ref <200)
HDL: 32 mg/dL — ABNORMAL LOW (ref >=40)
LDL Cholesterol (Calc): 102 mg/dL (calc) — ABNORMAL HIGH
Non-HDL Cholesterol (Calc): 151 mg/dL (calc) — ABNORMAL HIGH (ref <130)
Total CHOL/HDL Ratio: 5.7 (calc) — ABNORMAL HIGH (ref <5.0)
Triglycerides: 367 mg/dL — ABNORMAL HIGH (ref <150)

## 2019-11-13 MED FILL — JANUVIA 100 MG TABLET: 100 | 30 days supply | Qty: 30 | Fill #5

## 2019-11-14 ENCOUNTER — Other Ambulatory Visit: Payer: Self-pay | Admitting: Physician Assistant

## 2019-11-14 MED ORDER — EZETIMIBE 10 MG PO TABS
10.0000 mg | ORAL_TABLET | Freq: Every day | ORAL | 1 refills | Status: DC
Start: 2019-11-14 — End: 2021-03-28

## 2019-11-14 MED FILL — EZETIMIBE 10 MG TABS: 10 | 90 days supply | Qty: 90 | Fill #0

## 2019-11-18 ENCOUNTER — Other Ambulatory Visit: Payer: Self-pay

## 2019-11-18 ENCOUNTER — Ambulatory Visit: Payer: Medicare Other | Admitting: Occupational Therapy

## 2019-11-18 DIAGNOSIS — R251 Tremor, unspecified: Secondary | ICD-10-CM | POA: Diagnosis not present

## 2019-11-18 DIAGNOSIS — R2689 Other abnormalities of gait and mobility: Secondary | ICD-10-CM | POA: Diagnosis not present

## 2019-11-18 DIAGNOSIS — G8929 Other chronic pain: Secondary | ICD-10-CM | POA: Diagnosis not present

## 2019-11-18 DIAGNOSIS — R29818 Other symptoms and signs involving the nervous system: Secondary | ICD-10-CM

## 2019-11-18 DIAGNOSIS — R278 Other lack of coordination: Secondary | ICD-10-CM | POA: Diagnosis not present

## 2019-11-18 DIAGNOSIS — R29898 Other symptoms and signs involving the musculoskeletal system: Secondary | ICD-10-CM | POA: Diagnosis not present

## 2019-11-18 DIAGNOSIS — R293 Abnormal posture: Secondary | ICD-10-CM

## 2019-11-18 DIAGNOSIS — M25512 Pain in left shoulder: Secondary | ICD-10-CM | POA: Diagnosis not present

## 2019-11-18 DIAGNOSIS — M25612 Stiffness of left shoulder, not elsewhere classified: Secondary | ICD-10-CM | POA: Diagnosis not present

## 2019-11-18 DIAGNOSIS — R2681 Unsteadiness on feet: Secondary | ICD-10-CM | POA: Diagnosis not present

## 2019-11-18 NOTE — Therapy (Signed)
[facility] [address] [place], [place], [address] Phone: [phone]   Fax:  [phone]  Occupational Therapy Treatment  Patient Details  Name: Todd Rice MRN: [id number] Date of Birth: 08-17-1955 Referring Provider (OT): Dr. Margette Fast   Encounter Date: 11/18/2019   OT End of Session - 11/18/19 0922    Visit Number 2    Number of Visits 10    Date for OT Re-Evaluation 12/11/19    Authorization Type UHC [place] 2021, no deductible, VL: [place], no auth    OT Start Time 0848    OT Stop Time 0926    OT Time Calculation (min) 38 min    Activity Tolerance Patient tolerated treatment well    Behavior During Therapy [facility] for tasks assessed/performed           Past Medical History:  Diagnosis Date  . Adult BMI 50.0-59.9 kg/sq m    52.77  . Anal fissure   . Anxiety   . Diabetes mellitus without complication (HCC)    borderline  . GERD (gastroesophageal reflux disease)    30 years ago, maybe from peptic ulcer  . H/O: gout    STABLE  . Hepatic steatosis 10/01/11  . History of kidney stones   . Hyperlipidemia   . Hypertension   . Kidney tumor    right upper kidney tumor, monitoring  . OSA on CPAP    [facility]  . Parkinson disease ([place]) 01/04/2016  . REM sleep behavior disorder 12/06/2016  . Right ureteral stone   . Ureteral calculus, right 06/12/2011  . Vitamin D deficiency   . Weakness     Past Surgical History:  Procedure Laterality Date  . ANAL FISSURE REPAIR  10-12-2000  . COLONOSCOPY N/A 07/01/2012   Procedure: COLONOSCOPY;  Surgeon: Lafayette Dragon, MD;  [facility]: [facility];  Service: Endoscopy;  Laterality: N/A;  . CYSTOSCOPY WITH RETROGRADE PYELOGRAM, URETEROSCOPY AND STENT PLACEMENT Bilateral 02/26/2013   Procedure: CYSTOSCOPY WITH RETROGRADE PYELOGRAM, URETEROSCOPY AND STENT PLACEMENT;  Surgeon: Alexis Frock, MD;  [facility]: [facility];  Service: Urology;  Laterality: Bilateral;  . HOLMIUM LASER  APPLICATION Bilateral 45/62/5638   Procedure: HOLMIUM LASER APPLICATION;  Surgeon: Alexis Frock, MD;  [facility]: [facility];  Service: Urology;  Laterality: Bilateral;  . kidney stone removal     06/2011-stent placement  . LEFT MEDIAL FEMORAL CONDYLE DEBRIDEMENT & DRILLING/ REMOVAL LOOSE BODY  09-15-2003  . NASAL SEPTUM SURGERY  1985  . STONE EXTRACTION WITH BASKET  06/12/2011   Procedure: STONE EXTRACTION WITH BASKET;  Surgeon: Claybon Jabs, MD;  Location[facility];  Service: Urology;  Laterality: Right;    There were no vitals filed for this visit.   Subjective Assessment - 11/18/19 0851    Subjective  Pt reports shoulder pain    Pertinent History Parkinson's Disease.  PMH:CAD, HTN, HDL, obesity, asthma, DM, stage 3 CKD, vitamin D deficiency, REM sleep disorder, OSA, aortic atherosclerosis, hx of gout, GERD, hx of anxiety    Patient Stated Goals improve L shoulder pain, use hands to pick up things easier, keep limber, move neck to L side, cut up meat    Currently in Pain? Yes    Pain Score 4     Pain Location Shoulder    Pain Orientation Left    Pain Descriptors / Indicators Aching    Pain Type Chronic pain    Pain Onset More than a month ago    Pain  Frequency Intermittent    Aggravating Factors  malpositioning    Pain Relieving Factors repositioning                 Treatment: supine PWR! Up x 10 reps , mod facilitation at shoulders, sidelying scapular mobs with self ROM shoulder flexion. Attempted PWR! Rock in supine but pt had sharp pain so discontinued. [place] 86mhz, 0.8 w/cm 2, 20% x 8 mins to anterior and upper shoulder, upper arm.No adverse reactions. Cane exercises: shoulder flexion, extension, internal/ external rotation and abduction 10-15 reps each, min v.c for proper positioning. Wall slides x 10 reps, min v.c No pain at end of session.                    OT Long Term Goals - 11/11/19 0859      OT LONG TERM GOAL #1   Title Pt will  be independent with updated PD-specific HEP. --check LTGs 12/12/19    Time 5    Period Weeks    Status New      OT LONG TERM GOAL #2   Title Pt will consistently demo at least 140* L shoulder flex with pain less than or equal to 2/10 without cues for proper positioning.    Baseline 135* with 3/10 pain    Time 5    Period Weeks    Status New      OT LONG TERM GOAL #3   Title Pt will improve R hand coordination for ADLs as shown by improving time on 9-hole peg test 4 sec.    Baseline 40.78sec    Time 5    Period Weeks    Status New      OT LONG TERM GOAL #4   Title Pt will verbalize understanding of adapted strategies for increased ease and independence wtih ADLS/ IADLs.    Time 5    Period Weeks    Status New      OT LONG TERM GOAL #5   Title Pt will report cutting up food as "moderately difficult" using strategies.    Baseline "very difficult"    Time 5    Period Weeks    Status New                 Plan - 11/18/19 0940    Clinical Impression Statement Pt presents with pain in left shoulder. Pt sees orthopedist tomorrow.    OT Occupational Profile and History Detailed Assessment- Review of Records and additional review of physical, cognitive, psychosocial history related to current functional performance    Occupational performance deficits (Please refer to evaluation for details): ADL's;IADL's;Leisure    Body Structure / Function / Physical Skills ADL;Balance;IADL;ROM;Improper spinal/pelvic alignment;Mobility;Tone;UE functional use;Decreased knowledge of use of DME;Pain;Coordination;FMC   bradykinesia   Cognitive Skills Memory    Rehab Potential Good    Clinical Decision Making Several treatment options, min-mod task modification necessary    Comorbidities Affecting Occupational Performance: May have comorbidities impacting occupational performance    Modification or Assistance to Complete Evaluation  Min-Moderate modification of tasks or assist with assess necessary  to complete eval    OT Frequency 2x / week    OT Duration --   5 weeks   OT Treatment/Interventions Self-care/ADL training;DME and/or AE instruction;Balance training;Therapeutic activities;Aquatic Therapy;Therapeutic exercise;Cognitive remediation/compensation;Passive range of motion;Functional Mobility Training;Neuromuscular education;Manual Therapy;Patient/family education    Plan [place], cane exercises, focus on shoulder positioning    Consulted and Agree with Plan of Care Patient  Patient will benefit from skilled therapeutic intervention in order to improve the following deficits and impairments:   Body Structure / Function / Physical Skills: ADL, Balance, IADL, ROM, Improper spinal/pelvic alignment, Mobility, Tone, UE functional use, Decreased knowledge of use of DME, Pain, Coordination, FMC (bradykinesia) Cognitive Skills: Memory     Visit Diagnosis: Other lack of coordination  Other symptoms and signs involving the musculoskeletal system  Abnormal posture  Other symptoms and signs involving the nervous system  Chronic left shoulder pain    Problem List Patient Active Problem List   Diagnosis Date Noted  . Depression, major, recurrent, in partial remission ([place]) 11/11/2019  . Central sleep apnea due to Cheyne-Stokes respiration 08/31/2019  . Elevated PSA 08/13/2019  . Hyperlipidemia associated with type 2 diabetes mellitus ([place]) 07/09/2019  . Aortic atherosclerosis ([place]) 06/10/2018  . Controlled type 2 diabetes mellitus with diabetic dermatitis, without long-term current use of insulin ([place]) 01/21/2018  . OSA treated with BiPAP 01/03/2017  . REM sleep behavior disorder 12/06/2016  . Uncontrolled secondary diabetes mellitus with stage 3 CKD (GFR 30-59) (HCC) 10/23/2016  . Medication management 10/23/2016  . Vitamin D deficiency 10/23/2016  . Parkinson disease ([place]) 01/04/2016  . Seasonal and perennial allergic rhinitis 06/21/2014  . Asthma, mild intermittent,  well-controlled 06/21/2014  . Pseudomonas aeruginosa colonization 10/08/2012  . CAD (coronary artery disease) 11/15/2011  . Hypertension 11/15/2011  . Hyperlipidemia 11/15/2011  . Morbid obesity with BMI of 40.0-44.9, adult ([place]) 11/15/2011    Marlyce Mcdougald 11/18/2019, 9:41 AM  [facility] [address] [place], [place], [address] Phone: [phone]   Fax:  [phone]  Name: Todd Rice MRN: [id number] Date of Birth: 1956/03/31

## 2019-11-19 ENCOUNTER — Encounter: Payer: Self-pay | Admitting: Physician Assistant

## 2019-11-19 ENCOUNTER — Ambulatory Visit: Payer: Medicare Other | Admitting: Physician Assistant

## 2019-11-19 ENCOUNTER — Other Ambulatory Visit: Payer: Self-pay

## 2019-11-19 DIAGNOSIS — M25512 Pain in left shoulder: Secondary | ICD-10-CM

## 2019-11-19 MED ORDER — METHYLPREDNISOLONE ACETATE 40 MG/ML IJ SUSP
40.0000 mg | INTRAMUSCULAR | Status: AC | PRN
  Administered 2019-11-19: 40 mg via INTRA_ARTICULAR

## 2019-11-19 MED ORDER — LIDOCAINE HCL 1 % IJ SOLN
3.0000 mL | INTRAMUSCULAR | Status: AC | PRN
  Administered 2019-11-19: 3 mL

## 2019-11-19 NOTE — Progress Notes (Signed)
HPI: Mr. Junkins returns today due to increasing left shoulder pain.  8 states pain is better than when he refers [name] and underwent injection.  He has been going to physical therapy and states this is helping.  He states in fact his shoulder feels 80% better.  Last cortisone injection was 07/30/2019.  He has pain with reaching above his head and behind him.  Physical exam: General well-developed well-nourished male no acute distress mood affect appropriate. Bilateral shoulders 5 5 strength with external and internal rotation against resistance.  Empty can test negative bilaterally liftoff test negative bilaterally.  Impingement testing positive on the left.  Impression left shoulder impingement  Plan: Patient would like to try another injection in the left shoulder.  Therefore subacromial injection was provided.  He will continue work with physical therapy.  He will follow-up with [place] on as-needed basis.  If his pain persist or becomes worse he may benefit from MRI to rule out any type of rotator cuff tear.  Questions are encouraged and answered.   Procedure Note  Patient: Todd Rice             Date of Birth: 1955-11-30           MRN: [id number]             Visit Date: 11/19/2019  Procedures: Visit Diagnoses:  1. Left shoulder pain, unspecified chronicity     Large Joint Inj: L subacromial bursa on 11/19/2019 5:19 PM Indications: pain Details: 22 G 1.5 in needle, superior approach  Arthrogram: No  Medications: 3 mL lidocaine 1 %; 40 mg methylPREDNISolone acetate 40 MG/ML Outcome: tolerated well, no immediate complications Procedure, treatment alternatives, risks and benefits explained, specific risks discussed. Consent was given by the patient. Immediately prior to procedure a time out was called to verify the correct patient, procedure, equipment, support staff and site/side marked as required. Patient was prepped and draped in the usual sterile fashion.

## 2019-11-20 ENCOUNTER — Encounter: Payer: Self-pay | Admitting: Occupational Therapy

## 2019-11-20 ENCOUNTER — Ambulatory Visit: Payer: Medicare Other | Admitting: Occupational Therapy

## 2019-11-20 DIAGNOSIS — G8929 Other chronic pain: Secondary | ICD-10-CM | POA: Diagnosis not present

## 2019-11-20 DIAGNOSIS — R29818 Other symptoms and signs involving the nervous system: Secondary | ICD-10-CM | POA: Diagnosis not present

## 2019-11-20 DIAGNOSIS — M25612 Stiffness of left shoulder, not elsewhere classified: Secondary | ICD-10-CM

## 2019-11-20 DIAGNOSIS — R251 Tremor, unspecified: Secondary | ICD-10-CM | POA: Diagnosis not present

## 2019-11-20 DIAGNOSIS — R293 Abnormal posture: Secondary | ICD-10-CM

## 2019-11-20 DIAGNOSIS — R2689 Other abnormalities of gait and mobility: Secondary | ICD-10-CM | POA: Diagnosis not present

## 2019-11-20 DIAGNOSIS — R29898 Other symptoms and signs involving the musculoskeletal system: Secondary | ICD-10-CM

## 2019-11-20 DIAGNOSIS — M25512 Pain in left shoulder: Secondary | ICD-10-CM

## 2019-11-20 DIAGNOSIS — R2681 Unsteadiness on feet: Secondary | ICD-10-CM | POA: Diagnosis not present

## 2019-11-20 DIAGNOSIS — R278 Other lack of coordination: Secondary | ICD-10-CM | POA: Diagnosis not present

## 2019-11-20 NOTE — Therapy (Signed)
[facility] [address] [place], [place], [address] Phone: [phone]   Fax:  [phone]  Occupational Therapy Treatment  Patient Details  Name: Todd Rice MRN: [id number] Date of Birth: [birth year] Referring Provider (OT): Dr. Margette Fast   Encounter Date: 11/20/2019   OT End of Session - 11/20/19 1430    Visit Number 3    Number of Visits 10    Date for OT Re-Evaluation 12/11/19    Authorization Type UHC [place] 2021, no deductible, VL: [place], no auth    OT Start Time 0805    OT Stop Time 0847    OT Time Calculation (min) 42 min    Activity Tolerance Patient tolerated treatment well    Behavior During Therapy [facility] for tasks assessed/performed           Past Medical History:  Diagnosis Date  . Adult BMI 50.0-59.9 kg/sq m    52.77  . Anal fissure   . Anxiety   . Diabetes mellitus without complication (HCC)    borderline  . GERD (gastroesophageal reflux disease)    30 years ago, maybe from peptic ulcer  . H/O: gout    STABLE  . Hepatic steatosis 10/01/11  . History of kidney stones   . Hyperlipidemia   . Hypertension   . Kidney tumor    right upper kidney tumor, monitoring  . OSA on CPAP    [facility]  . Parkinson disease ([place]) 01/04/2016  . REM sleep behavior disorder 12/06/2016  . Right ureteral stone   . Ureteral calculus, right 06/12/2011  . Vitamin D deficiency   . Weakness     Past Surgical History:  Procedure Laterality Date  . ANAL FISSURE REPAIR  10-12-2000  . COLONOSCOPY N/A 07/01/2012   Procedure: COLONOSCOPY;  Surgeon: Lafayette Dragon, MD;  [facility];  Service: Endoscopy;  Laterality: N/A;  . CYSTOSCOPY WITH RETROGRADE PYELOGRAM, URETEROSCOPY AND STENT PLACEMENT Bilateral 02/26/2013   Procedure: CYSTOSCOPY WITH RETROGRADE PYELOGRAM, URETEROSCOPY AND STENT PLACEMENT;  Surgeon: Alexis Frock, MD;  [facility];  Service: Urology;  Laterality: Bilateral;  . HOLMIUM LASER  APPLICATION Bilateral 69/67/8938   Procedure: HOLMIUM LASER APPLICATION;  Surgeon: Alexis Frock, MD;  [facility];  Service: Urology;  Laterality: Bilateral;  . kidney stone removal     06/2011-stent placement  . LEFT MEDIAL FEMORAL CONDYLE DEBRIDEMENT & DRILLING/ REMOVAL LOOSE BODY  09-15-2003  . NASAL SEPTUM SURGERY  1985  . STONE EXTRACTION WITH BASKET  06/12/2011   Procedure: STONE EXTRACTION WITH BASKET;  Surgeon: Claybon Jabs, MD;  [facility];  Service: Urology;  Laterality: Right;    There were no vitals filed for this visit.   Subjective Assessment - 11/20/19 1425    Subjective  Pt reports shoulder pain improved after ultrasound.  Pt saw Ortho MD yesterday and had cortisone injection and that MD reported no restrictions, continue ultrasound.    Pertinent History Parkinson's Disease.  PMH:CAD, HTN, HDL, obesity, asthma, DM, stage 3 CKD, vitamin D deficiency, REM sleep disorder, OSA, aortic atherosclerosis, hx of gout, GERD, hx of anxiety    Patient Stated Goals improve L shoulder pain, use hands to pick up things easier, keep limber, move neck to L side, cut up meat    Currently in Pain? Yes    Pain Score 3     Pain Location Shoulder    Pain Orientation Left    Pain Descriptors / Indicators Aching;Sore  from injection   Pain Type Chronic pain    Pain Onset More than a month ago    Pain Frequency Intermittent    Aggravating Factors  malpositioning    Pain Relieving Factors repositioning             Supine, gentle joint mobs/stretch to L shoulder.  Ultrasound  58mhz, 1.0 w/cm 2, 20% x 8 mins to anterior and upper shoulder, upper arm.  No adverse reactions.  Supine closed-chain shoulder flex and chest press with foam noodle with min v.c.  Sitting, closed-chain shoulder ER and abduction with min v.c.with min v.c for proper positioning.  Standing functional reaching with LUE to grasp cylinder objects and place on overhead shelf with min v.c.  for proper positioning and large amplitude movements (reaching in abduction overhead) with no pain.         OT Education - 11/20/19 1427    Education Details Reinforced proper positioning of L shoulder with raising arm    Person(s) Educated Patient    Methods Explanation;Demonstration;Verbal cues    Comprehension Verbalized understanding;Returned demonstration               OT Long Term Goals - 11/11/19 0859      OT LONG TERM GOAL #1   Title Pt will be independent with updated PD-specific HEP. --check LTGs 12/12/19    Time 5    Period Weeks    Status New      OT LONG TERM GOAL #2   Title Pt will consistently demo at least 140* L shoulder flex with pain less than or equal to 2/10 without cues for proper positioning.    Baseline 135* with 3/10 pain    Time 5    Period Weeks    Status New      OT LONG TERM GOAL #3   Title Pt will improve R hand coordination for ADLs as shown by improving time on 9-hole peg test 4 sec.    Baseline 40.78sec    Time 5    Period Weeks    Status New      OT LONG TERM GOAL #4   Title Pt will verbalize understanding of adapted strategies for increased ease and independence wtih ADLS/ IADLs.    Time 5    Period Weeks    Status New      OT LONG TERM GOAL #5   Title Pt will report cutting up food as "moderately difficult" using strategies.    Baseline "very difficult"    Time 5    Period Weeks    Status New                 Plan - 11/20/19 1430    Clinical Impression Statement Pt reports that he had cortisone injection yesterday in L shoulder (pt reports that MD stated no restrictions and ok for ultrasound).  Pt    OT Occupational Profile and History Detailed Assessment- Review of Records and additional review of physical, cognitive, psychosocial history related to current functional performance    Occupational performance deficits (Please refer to evaluation for details): ADL's;IADL's;Leisure    Body Structure / Function /  Physical Skills ADL;Balance;IADL;ROM;Improper spinal/pelvic alignment;Mobility;Tone;UE functional use;Decreased knowledge of use of DME;Pain;Coordination;FMC   bradykinesia   Cognitive Skills Memory    Rehab Potential Good    Clinical Decision Making Several treatment options, min-mod task modification necessary    Comorbidities Affecting Occupational Performance: May have comorbidities impacting occupational performance    Modification  or Assistance to Complete Evaluation  Min-Moderate modification of tasks or assist with assess necessary to complete eval    OT Frequency 2x / week    OT Duration --   5 weeks   OT Treatment/Interventions Self-care/ADL training;DME and/or AE instruction;Balance training;Therapeutic activities;Aquatic Therapy;Therapeutic exercise;Cognitive remediation/compensation;Passive range of motion;Functional Mobility Training;Neuromuscular education;Manual Therapy;Patient/family education    Plan [place], cane exercises, focus on shoulder positioning    Consulted and Agree with Plan of Care Patient           Patient will benefit from skilled therapeutic intervention in order to improve the following deficits and impairments:   Body Structure / Function / Physical Skills: ADL, Balance, IADL, ROM, Improper spinal/pelvic alignment, Mobility, Tone, UE functional use, Decreased knowledge of use of DME, Pain, Coordination, FMC (bradykinesia) Cognitive Skills: Memory     Visit Diagnosis: Chronic left shoulder pain  Other symptoms and signs involving the musculoskeletal system  Other lack of coordination  Abnormal posture  Other symptoms and signs involving the nervous system  Unsteadiness on feet  Stiffness of left shoulder, not elsewhere classified    Problem List Patient Active Problem List   Diagnosis Date Noted  . Depression, major, recurrent, in partial remission ([place]) 11/11/2019  . Central sleep apnea due to Cheyne-Stokes respiration 08/31/2019  . Elevated  PSA 08/13/2019  . Hyperlipidemia associated with type 2 diabetes mellitus ([place]) 07/09/2019  . Aortic atherosclerosis ([place]) 06/10/2018  . Controlled type 2 diabetes mellitus with diabetic dermatitis, without long-term current use of insulin ([place]) 01/21/2018  . OSA treated with BiPAP 01/03/2017  . REM sleep behavior disorder 12/06/2016  . Uncontrolled secondary diabetes mellitus with stage 3 CKD (GFR 30-59) (HCC) 10/23/2016  . Medication management 10/23/2016  . Vitamin D deficiency 10/23/2016  . Parkinson disease ([place]) 01/04/2016  . Seasonal and perennial allergic rhinitis 06/21/2014  . Asthma, mild intermittent, well-controlled 06/21/2014  . Pseudomonas aeruginosa colonization 10/08/2012  . CAD (coronary artery disease) 11/15/2011  . Hypertension 11/15/2011  . Hyperlipidemia 11/15/2011  . Morbid obesity with BMI of 40.0-44.9, adult [facility]) 11/15/2011    [facility] 11/20/2019, 2:37 PM  [facility] [address] [place], [place], [address] Phone: [phone]   Fax:  [phone]  Name: Todd Rice MRN: [id number] Date of Birth: May 09, 1955   Vianne Bulls, OTR/L [facility] [address] [place], [place]  [address] [phone] phone [phone] 11/20/19 2:42 PM

## 2019-11-25 ENCOUNTER — Encounter: Payer: Self-pay | Admitting: Occupational Therapy

## 2019-11-25 ENCOUNTER — Other Ambulatory Visit: Payer: Self-pay

## 2019-11-25 ENCOUNTER — Ambulatory Visit: Payer: Medicare Other | Admitting: Occupational Therapy

## 2019-11-25 DIAGNOSIS — R29818 Other symptoms and signs involving the nervous system: Secondary | ICD-10-CM

## 2019-11-25 DIAGNOSIS — R251 Tremor, unspecified: Secondary | ICD-10-CM | POA: Diagnosis not present

## 2019-11-25 DIAGNOSIS — R2681 Unsteadiness on feet: Secondary | ICD-10-CM

## 2019-11-25 DIAGNOSIS — M25612 Stiffness of left shoulder, not elsewhere classified: Secondary | ICD-10-CM | POA: Diagnosis not present

## 2019-11-25 DIAGNOSIS — R293 Abnormal posture: Secondary | ICD-10-CM

## 2019-11-25 DIAGNOSIS — R2689 Other abnormalities of gait and mobility: Secondary | ICD-10-CM | POA: Diagnosis not present

## 2019-11-25 DIAGNOSIS — M25512 Pain in left shoulder: Secondary | ICD-10-CM

## 2019-11-25 DIAGNOSIS — R278 Other lack of coordination: Secondary | ICD-10-CM | POA: Diagnosis not present

## 2019-11-25 DIAGNOSIS — G8929 Other chronic pain: Secondary | ICD-10-CM | POA: Diagnosis not present

## 2019-11-25 DIAGNOSIS — R29898 Other symptoms and signs involving the musculoskeletal system: Secondary | ICD-10-CM | POA: Diagnosis not present

## 2019-11-25 NOTE — Patient Instructions (Signed)
° ° ° °  Cane Overhead - Standing   With arms straight, hold cane forward at waist. Raise cane above head. Hold 3 seconds. Repeat 15 times. Do 2 times per day.     ROM: Abduction - Wand   Holding wand with left hand palm up, push wand directly out to side, leading with other hand palm down, until stretch is felt. Hold 5 seconds. Repeat 15 times per set. Do 1-2 sessions per day.     ROM: Extension - Wand (Standing)   Stand holding wand behind back. Raise arms as far as possible. Repeat 15 times per set.  Do 1-2 sessions per day.

## 2019-11-25 NOTE — Therapy (Addendum)
[place] [address] [place], [place], [address] Phone: [phone]   Fax:  [phone]  Occupational Therapy Treatment  Patient Details  Name: Todd Rice MRN: [id number] Date of Birth: 1955-08-06 Referring Provider (OT): Dr. Margette Fast   Encounter Date: 11/25/2019   OT End of Session - 11/25/19 0822    Visit Number 4    Number of Visits 10    Date for OT Re-Evaluation 12/11/19    Authorization Type [employer] [place] 2021, no deductible, VL: [place], no auth    OT Start Time 0804    OT Stop Time 0845    OT Time Calculation (min) 41 min    Activity Tolerance Patient tolerated treatment well    Behavior During Therapy [facility] for tasks assessed/performed           Past Medical History:  Diagnosis Date  . Adult BMI 50.0-59.9 kg/sq m    52.77  . Anal fissure   . Anxiety   . Diabetes mellitus without complication (HCC)    borderline  . GERD (gastroesophageal reflux disease)    30 years ago, maybe from peptic ulcer  . H/O: gout    STABLE  . Hepatic steatosis 10/01/11  . History of kidney stones   . Hyperlipidemia   . Hypertension   . Kidney tumor    right upper kidney tumor, monitoring  . OSA on CPAP    [facility]  . Parkinson disease ([place]) 01/04/2016  . REM sleep behavior disorder 12/06/2016  . Right ureteral stone   . Ureteral calculus, right 06/12/2011  . Vitamin D deficiency   . Weakness     Past Surgical History:  Procedure Laterality Date  . ANAL FISSURE REPAIR  10-12-2000  . COLONOSCOPY N/A 07/01/2012   Procedure: COLONOSCOPY;  Surgeon: Lafayette Dragon, MD;  [facility];  Service: Endoscopy;  Laterality: N/A;  . CYSTOSCOPY WITH RETROGRADE PYELOGRAM, URETEROSCOPY AND STENT PLACEMENT Bilateral 02/26/2013   Procedure: CYSTOSCOPY WITH RETROGRADE PYELOGRAM, URETEROSCOPY AND STENT PLACEMENT;  Surgeon: Alexis Frock, MD;  Location: [facility];  Service: Urology;  Laterality: Bilateral;  . HOLMIUM LASER  APPLICATION Bilateral 16/60/6301   Procedure: HOLMIUM LASER APPLICATION;  Surgeon: Alexis Frock, MD;  Location: [facility];  Service: Urology;  Laterality: Bilateral;  . kidney stone removal     06/2011-stent placement  . LEFT MEDIAL FEMORAL CONDYLE DEBRIDEMENT & DRILLING/ REMOVAL LOOSE BODY  09-15-2003  . NASAL SEPTUM SURGERY  1985  . STONE EXTRACTION WITH BASKET  06/12/2011   Procedure: STONE EXTRACTION WITH BASKET;  Surgeon: Claybon Jabs, MD;  Location: [facility];  Service: Urology;  Laterality: Right;    There were no vitals filed for this visit.   Subjective Assessment - 11/25/19 0803    Subjective  Pt reports that L shoulder is feeling so much better after the ultrasound    Pertinent History Parkinson's Disease.  PMH:CAD, HTN, HDL, obesity, asthma, DM, stage 3 CKD, vitamin D deficiency, REM sleep disorder, OSA, aortic atherosclerosis, hx of gout, GERD, hx of anxiety    Patient Stated Goals improve L shoulder pain, use hands to pick up things easier, keep limber, move neck to L side, cut up meat    Currently in Pain? Yes    Pain Score 2     Pain Location Shoulder    Pain Orientation Left    Pain Descriptors / Indicators Aching;Sore    Pain Type Chronic pain    Pain Onset  More than a month ago    Pain Frequency Intermittent    Aggravating Factors  malpositioning    Pain Relieving Factors repositioning            Ultrasound  24mhz, 1.0 w/cm 2, 50% x 8 mins to anterior and upper shoulder, upper arm.  No adverse reactions.  Standing functional reaching with each UE to grasp cylinder objects and place on overhead shelf with min v.c. for proper positioning and large amplitude movements (reaching in abduction overhead) with no pain incorporating trunk rotation and lateral wt. shift.    Standing, rolling ball up wall with BUEs for AAROM shoulder flex for stretch.   Reviewed coordination exercises from HEP:  Flipping cards BUEs, dealing cards with thumb bilaterally  (simultaneously), and sliding cards across table with each UE and then BUEs simultaneously.      OT Education - 11/25/19 1336    Education Details Cane HEP-see pt instructions    Person(s) Educated Patient    Methods Explanation;Demonstration;Verbal cues;Handout    Comprehension Verbalized understanding;Returned demonstration;Verbal cues required              OT Long Term Goals - 11/11/19 0859      OT LONG TERM GOAL #1   Title Pt will be independent with updated PD-specific HEP. --check LTGs 12/12/19    Time 5    Period Weeks    Status New      OT LONG TERM GOAL #2   Title Pt will consistently demo at least 140* L shoulder flex with pain less than or equal to 2/10 without cues for proper positioning.    Baseline 135* with 3/10 pain    Time 5    Period Weeks    Status New      OT LONG TERM GOAL #3   Title Pt will improve R hand coordination for ADLs as shown by improving time on 9-hole peg test 4 sec.    Baseline 40.78sec    Time 5    Period Weeks    Status New      OT LONG TERM GOAL #4   Title Pt will verbalize understanding of adapted strategies for increased ease and independence wtih ADLS/ IADLs.    Time 5    Period Weeks    Status New      OT LONG TERM GOAL #5   Title Pt will report cutting up food as "moderately difficult" using strategies.    Baseline "very difficult"    Time 5    Period Weeks    Status New                 Plan - 11/25/19 8119    Clinical Impression Statement Pt is progressing towards goals with improving pain (no pain at end of session).   Pt reports not performing coordination HEP consistently.  Made recommendations to incr consistency.    OT Occupational Profile and History Detailed Assessment- Review of Records and additional review of physical, cognitive, psychosocial history related to current functional performance    Occupational performance deficits (Please refer to evaluation for details): ADL's;IADL's;Leisure    Body  Structure / Function / Physical Skills ADL;Balance;IADL;ROM;Improper spinal/pelvic alignment;Mobility;Tone;UE functional use;Decreased knowledge of use of DME;Pain;Coordination;FMC   bradykinesia   Cognitive Skills Memory    Rehab Potential Good    Clinical Decision Making Several treatment options, min-mod task modification necessary    Comorbidities Affecting Occupational Performance: May have comorbidities impacting occupational performance    Modification  or Assistance to Complete Evaluation  Min-Moderate modification of tasks or assist with assess necessary to complete eval    OT Frequency 2x / week    OT Duration --   5 weeks   OT Treatment/Interventions Self-care/ADL training;DME and/or AE instruction;Balance training;Therapeutic activities;Aquatic Therapy;Therapeutic exercise;Cognitive remediation/compensation;Passive range of motion;Functional Mobility Training;Neuromuscular education;Manual Therapy;Patient/family education    Plan Ultrasound prn, cane exercises, focus on shoulder positioning, coordination, strategies for cutting food    Consulted and Agree with Plan of Care Patient           Patient will benefit from skilled therapeutic intervention in order to improve the following deficits and impairments:   Body Structure / Function / Physical Skills: ADL, Balance, IADL, ROM, Improper spinal/pelvic alignment, Mobility, Tone, UE functional use, Decreased knowledge of use of DME, Pain, Coordination, FMC (bradykinesia) Cognitive Skills: Memory     Visit Diagnosis: Other symptoms and signs involving the nervous system  Other symptoms and signs involving the musculoskeletal system  Other lack of coordination  Abnormal posture  Chronic left shoulder pain  Unsteadiness on feet  Stiffness of left shoulder, not elsewhere classified  Tremor  Other abnormalities of gait and mobility    Problem List Patient Active Problem List   Diagnosis Date Noted  . Depression,  major, recurrent, in partial remission ([place]) 11/11/2019  . Central sleep apnea due to Cheyne-Stokes respiration 08/31/2019  . Elevated PSA 08/13/2019  . Hyperlipidemia associated with type 2 diabetes mellitus ([place]) 07/09/2019  . Aortic atherosclerosis ([place]) 06/10/2018  . Controlled type 2 diabetes mellitus with diabetic dermatitis, without long-term current use of insulin ([place]) 01/21/2018  . OSA treated with BiPAP 01/03/2017  . REM sleep behavior disorder 12/06/2016  . Uncontrolled secondary diabetes mellitus with stage 3 CKD (GFR 30-59) (HCC) 10/23/2016  . Medication management 10/23/2016  . Vitamin D deficiency 10/23/2016  . Parkinson disease ([place]) 01/04/2016  . Seasonal and perennial allergic rhinitis 06/21/2014  . Asthma, mild intermittent, well-controlled 06/21/2014  . Pseudomonas aeruginosa colonization 10/08/2012  . CAD (coronary artery disease) 11/15/2011  . Hypertension 11/15/2011  . Hyperlipidemia 11/15/2011  . Morbid obesity with BMI of 40.0-44.9, adult [facility]) 11/15/2011    [facility] 11/25/2019, 1:39 PM  [place] [address] [place], [place], [address] Phone: [phone]   Fax:  [phone]  Name: Todd Rice MRN: [id number] Date of Birth: Dec 22, 1955   Vianne Bulls, OTR/L [facility] [address] [place], [place]  [address] [phone] phone [phone] 11/25/19 1:39 PM

## 2019-11-27 ENCOUNTER — Other Ambulatory Visit: Payer: Self-pay

## 2019-11-27 ENCOUNTER — Encounter: Payer: Self-pay | Admitting: Occupational Therapy

## 2019-11-27 ENCOUNTER — Ambulatory Visit: Payer: Medicare Other | Admitting: Occupational Therapy

## 2019-11-27 DIAGNOSIS — R2681 Unsteadiness on feet: Secondary | ICD-10-CM

## 2019-11-27 DIAGNOSIS — M25612 Stiffness of left shoulder, not elsewhere classified: Secondary | ICD-10-CM

## 2019-11-27 DIAGNOSIS — G8929 Other chronic pain: Secondary | ICD-10-CM

## 2019-11-27 DIAGNOSIS — R29818 Other symptoms and signs involving the nervous system: Secondary | ICD-10-CM

## 2019-11-27 DIAGNOSIS — R293 Abnormal posture: Secondary | ICD-10-CM | POA: Diagnosis not present

## 2019-11-27 DIAGNOSIS — R2689 Other abnormalities of gait and mobility: Secondary | ICD-10-CM

## 2019-11-27 DIAGNOSIS — R278 Other lack of coordination: Secondary | ICD-10-CM | POA: Diagnosis not present

## 2019-11-27 DIAGNOSIS — R29898 Other symptoms and signs involving the musculoskeletal system: Secondary | ICD-10-CM | POA: Diagnosis not present

## 2019-11-27 DIAGNOSIS — M25512 Pain in left shoulder: Secondary | ICD-10-CM | POA: Diagnosis not present

## 2019-11-27 DIAGNOSIS — R251 Tremor, unspecified: Secondary | ICD-10-CM | POA: Diagnosis not present

## 2019-11-27 NOTE — Therapy (Signed)
[place] [address] [place], [place], [address] Phone: [phone]   Fax:  [phone]  Occupational Therapy Treatment  Patient Details  Name: Todd Rice MRN: [id number] Date of Birth: [birth year] Referring Provider (OT): Dr. Margette Fast   Encounter Date: 11/27/2019   OT End of Session - 11/27/19 0927    Visit Number 5    Number of Visits 10    Date for OT Re-Evaluation 12/11/19    Authorization Type UHC [place] 2021, no deductible, VL: [place], no auth    OT Start Time [phone]    OT Stop Time 0930    OT Time Calculation (min) 40 min    Activity Tolerance Patient tolerated treatment well    Behavior During Therapy [facility] for tasks assessed/performed           Past Medical History:  Diagnosis Date  . Adult BMI 50.0-59.9 kg/sq m    52.77  . Anal fissure   . Anxiety   . Diabetes mellitus without complication (HCC)    borderline  . GERD (gastroesophageal reflux disease)    30 years ago, maybe from peptic ulcer  . H/O: gout    STABLE  . Hepatic steatosis 10/01/11  . History of kidney stones   . Hyperlipidemia   . Hypertension   . Kidney tumor    right upper kidney tumor, monitoring  . OSA on CPAP    [facility]  . Parkinson disease ([place]) 01/04/2016  . REM sleep behavior disorder 12/06/2016  . Right ureteral stone   . Ureteral calculus, right 06/12/2011  . Vitamin D deficiency   . Weakness     Past Surgical History:  Procedure Laterality Date  . ANAL FISSURE REPAIR  10-12-2000  . COLONOSCOPY N/A 07/01/2012   Procedure: COLONOSCOPY;  Surgeon: Lafayette Dragon, MD;  [facility];  Service: Endoscopy;  Laterality: N/A;  . CYSTOSCOPY WITH RETROGRADE PYELOGRAM, URETEROSCOPY AND STENT PLACEMENT Bilateral 02/26/2013   Procedure: CYSTOSCOPY WITH RETROGRADE PYELOGRAM, URETEROSCOPY AND STENT PLACEMENT;  Surgeon: Alexis Frock, MD;  [facility];  Service: Urology;  Laterality: Bilateral;  . HOLMIUM LASER  APPLICATION Bilateral 70/96/2836   Procedure: HOLMIUM LASER APPLICATION;  Surgeon: Alexis Frock, MD;  [facility];  Service: Urology;  Laterality: Bilateral;  . kidney stone removal     06/2011-stent placement  . LEFT MEDIAL FEMORAL CONDYLE DEBRIDEMENT & DRILLING/ REMOVAL LOOSE BODY  09-15-2003  . NASAL SEPTUM SURGERY  1985  . STONE EXTRACTION WITH BASKET  06/12/2011   Procedure: STONE EXTRACTION WITH BASKET;  Surgeon: Claybon Jabs, MD;  Location: [facility];  Service: Urology;  Laterality: Right;    There were no vitals filed for this visit.   Subjective Assessment - 11/27/19 0931    Subjective  no pain, "it's getting better"    Pertinent History Parkinson's Disease.  PMH:CAD, HTN, HDL, obesity, asthma, DM, stage 3 CKD, vitamin D deficiency, REM sleep disorder, OSA, aortic atherosclerosis, hx of gout, GERD, hx of anxiety    Patient Stated Goals improve L shoulder pain, use hands to pick up things easier, keep limber, move neck to L side, cut up meat    Currently in Pain? No/denies    Pain Onset More than a month ago             Ultrasound  29mhz, 1.0 w/cm 2, 50% x 8 mins to anterior and upper shoulder, upper arm.  No adverse reactions.  Cane Exercises with  foam noodle for shoulder flex, abduction, ER, and shoulder ext/scapular retraction with min cueing intermittently.  Standing, rolling ball up wall with BUEs for AAROM shoulder flex for stretch.   Reviewed coordination exercises from HEP:  Stacking coins with PWR! Hands prior to picking up coins (simullataneously with BUEs with min v.c.), Manipulating coins in hand to place in coin bank with each UE, rotating 2 golf balls in each hand with min-mod facilitation/cueing R hand, min cues L hand.        OT Long Term Goals - 11/11/19 0859      OT LONG TERM GOAL #1   Title Pt will be independent with updated PD-specific HEP. --check LTGs 12/12/19    Time 5    Period Weeks    Status New      OT LONG TERM  GOAL #2   Title Pt will consistently demo at least 140* L shoulder flex with pain less than or equal to 2/10 without cues for proper positioning.    Baseline 135* with 3/10 pain    Time 5    Period Weeks    Status New      OT LONG TERM GOAL #3   Title Pt will improve R hand coordination for ADLs as shown by improving time on 9-hole peg test 4 sec.    Baseline 40.78sec    Time 5    Period Weeks    Status New      OT LONG TERM GOAL #4   Title Pt will verbalize understanding of adapted strategies for increased ease and independence wtih ADLS/ IADLs.    Time 5    Period Weeks    Status New      OT LONG TERM GOAL #5   Title Pt will report cutting up food as "moderately difficult" using strategies.    Baseline "very difficult"    Time 5    Period Weeks    Status New                 Plan - 11/27/19 2979    Clinical Impression Statement Pt reports no L shoulder pain, improved LUE ROM and is progressing toward goals.    OT Occupational Profile and History Detailed Assessment- Review of Records and additional review of physical, cognitive, psychosocial history related to current functional performance    Occupational performance deficits (Please refer to evaluation for details): ADL's;IADL's;Leisure    Body Structure / Function / Physical Skills ADL;Balance;IADL;ROM;Improper spinal/pelvic alignment;Mobility;Tone;UE functional use;Decreased knowledge of use of DME;Pain;Coordination;FMC   bradykinesia   Cognitive Skills Memory    Rehab Potential Good    Clinical Decision Making Several treatment options, min-mod task modification necessary    Comorbidities Affecting Occupational Performance: May have comorbidities impacting occupational performance    Modification or Assistance to Complete Evaluation  Min-Moderate modification of tasks or assist with assess necessary to complete eval    OT Frequency 2x / week    OT Duration --   5 weeks   OT Treatment/Interventions Self-care/ADL  training;DME and/or AE instruction;Balance training;Therapeutic activities;Aquatic Therapy;Therapeutic exercise;Cognitive remediation/compensation;Passive range of motion;Functional Mobility Training;Neuromuscular education;Manual Therapy;Patient/family education    Plan Ultrasound prn, cane exercises, focus on shoulder positioning, coordination, strategies for cutting food    Consulted and Agree with Plan of Care Patient           Patient will benefit from skilled therapeutic intervention in order to improve the following deficits and impairments:   Body Structure / Function / Physical Skills: ADL,  Balance, IADL, ROM, Improper spinal/pelvic alignment, Mobility, Tone, UE functional use, Decreased knowledge of use of DME, Pain, Coordination, FMC (bradykinesia) Cognitive Skills: Memory     Visit Diagnosis: Other symptoms and signs involving the musculoskeletal system  Other lack of coordination  Other symptoms and signs involving the nervous system  Abnormal posture  Chronic left shoulder pain  Unsteadiness on feet  Stiffness of left shoulder, not elsewhere classified  Tremor  Other abnormalities of gait and mobility    Problem List Patient Active Problem List   Diagnosis Date Noted  . Depression, major, recurrent, in partial remission ([place]) 11/11/2019  . Central sleep apnea due to Cheyne-Stokes respiration 08/31/2019  . Elevated PSA 08/13/2019  . Hyperlipidemia associated with type 2 diabetes mellitus ([place]) 07/09/2019  . Aortic atherosclerosis ([place]) 06/10/2018  . Controlled type 2 diabetes mellitus with diabetic dermatitis, without long-term current use of insulin ([place]) 01/21/2018  . OSA treated with BiPAP 01/03/2017  . REM sleep behavior disorder 12/06/2016  . Uncontrolled secondary diabetes mellitus with stage 3 CKD (GFR 30-59) (HCC) 10/23/2016  . Medication management 10/23/2016  . Vitamin D deficiency 10/23/2016  . Parkinson disease ([place]) 01/04/2016  . Seasonal  and perennial allergic rhinitis 06/21/2014  . Asthma, mild intermittent, well-controlled 06/21/2014  . Pseudomonas aeruginosa colonization 10/08/2012  . CAD (coronary artery disease) 11/15/2011  . Hypertension 11/15/2011  . Hyperlipidemia 11/15/2011  . Morbid obesity with BMI of 40.0-44.9, adult [facility]) 11/15/2011    [facility] 11/27/2019, 2:43 PM  [place] [address] [place], [place], [address] Phone: [phone]   Fax:  [phone]  Name: Todd Rice MRN: [id number] Date of Birth: [birth year]   Vianne Bulls, OTR/L [facility] [address] [place], [place]  [address] [phone] phone [phone] 11/27/19 2:46 PM

## 2019-12-01 MED FILL — CARBIDOPA-LEVODOPA 25-250 T: 25-250 | 90 days supply | Qty: 270 | Fill #1

## 2019-12-01 MED FILL — VALSARTAN-HCTZ 320-25 MG TA: 320-25 | 30 days supply | Qty: 30 | Fill #9

## 2019-12-01 MED FILL — ALLOPURINOL 300 MG TABS: 300 | 30 days supply | Qty: 30 | Fill #2

## 2019-12-02 ENCOUNTER — Other Ambulatory Visit: Payer: Self-pay

## 2019-12-02 ENCOUNTER — Encounter: Payer: Self-pay | Admitting: Occupational Therapy

## 2019-12-02 ENCOUNTER — Ambulatory Visit: Payer: Medicare Other | Admitting: Occupational Therapy

## 2019-12-02 DIAGNOSIS — M25612 Stiffness of left shoulder, not elsewhere classified: Secondary | ICD-10-CM

## 2019-12-02 DIAGNOSIS — R29898 Other symptoms and signs involving the musculoskeletal system: Secondary | ICD-10-CM | POA: Diagnosis not present

## 2019-12-02 DIAGNOSIS — R251 Tremor, unspecified: Secondary | ICD-10-CM

## 2019-12-02 DIAGNOSIS — R293 Abnormal posture: Secondary | ICD-10-CM | POA: Diagnosis not present

## 2019-12-02 DIAGNOSIS — R29818 Other symptoms and signs involving the nervous system: Secondary | ICD-10-CM | POA: Diagnosis not present

## 2019-12-02 DIAGNOSIS — M25512 Pain in left shoulder: Secondary | ICD-10-CM | POA: Diagnosis not present

## 2019-12-02 DIAGNOSIS — G8929 Other chronic pain: Secondary | ICD-10-CM

## 2019-12-02 DIAGNOSIS — R278 Other lack of coordination: Secondary | ICD-10-CM | POA: Diagnosis not present

## 2019-12-02 DIAGNOSIS — R2689 Other abnormalities of gait and mobility: Secondary | ICD-10-CM | POA: Diagnosis not present

## 2019-12-02 DIAGNOSIS — R2681 Unsteadiness on feet: Secondary | ICD-10-CM | POA: Diagnosis not present

## 2019-12-02 NOTE — Therapy (Signed)
[facility] [address] [place], [place], [address] Phone: [phone]   Fax:  [phone]  Occupational Therapy Treatment  Patient Details  Name: Todd Rice MRN: [id number] Date of Birth: 05/21/1955 Referring Provider (OT): Dr. Margette Fast   Encounter Date: 12/02/2019   OT End of Session - 12/02/19 0923    Visit Number 6    Number of Visits 10    Date for OT Re-Evaluation 12/11/19    Authorization Type UHC [place] 2021, no deductible, VL: [place], no auth    OT Start Time [phone]    OT Stop Time 0930    OT Time Calculation (min) 38 min    Activity Tolerance Patient tolerated treatment well    Behavior During Therapy [facility] for tasks assessed/performed           Past Medical History:  Diagnosis Date  . Adult BMI 50.0-59.9 kg/sq m    52.77  . Anal fissure   . Anxiety   . Diabetes mellitus without complication (HCC)    borderline  . GERD (gastroesophageal reflux disease)    30 years ago, maybe from peptic ulcer  . H/O: gout    STABLE  . Hepatic steatosis 10/01/11  . History of kidney stones   . Hyperlipidemia   . Hypertension   . Kidney tumor    right upper kidney tumor, monitoring  . OSA on CPAP    [facility]  . Parkinson disease ([place]) 01/04/2016  . REM sleep behavior disorder 12/06/2016  . Right ureteral stone   . Ureteral calculus, right 06/12/2011  . Vitamin D deficiency   . Weakness     Past Surgical History:  Procedure Laterality Date  . ANAL FISSURE REPAIR  10-12-2000  . COLONOSCOPY N/A 07/01/2012   Procedure: COLONOSCOPY;  Surgeon: Lafayette Dragon, MD;  Location: [facility];  Service: Endoscopy;  Laterality: N/A;  . CYSTOSCOPY WITH RETROGRADE PYELOGRAM, URETEROSCOPY AND STENT PLACEMENT Bilateral 02/26/2013   Procedure: CYSTOSCOPY WITH RETROGRADE PYELOGRAM, URETEROSCOPY AND STENT PLACEMENT;  Surgeon: Alexis Frock, MD;  Location: [facility];  Service: Urology;  Laterality: Bilateral;  . HOLMIUM LASER  APPLICATION Bilateral 49/70/2637   Procedure: HOLMIUM LASER APPLICATION;  Surgeon: Alexis Frock, MD;  Location: [facility];  Service: Urology;  Laterality: Bilateral;  . kidney stone removal     06/2011-stent placement  . LEFT MEDIAL FEMORAL CONDYLE DEBRIDEMENT & DRILLING/ REMOVAL LOOSE BODY  09-15-2003  . NASAL SEPTUM SURGERY  1985  . STONE EXTRACTION WITH BASKET  06/12/2011   Procedure: STONE EXTRACTION WITH BASKET;  Surgeon: Claybon Jabs, MD;  [facility];  Service: Urology;  Laterality: Right;    There were no vitals filed for this visit.   Subjective Assessment - 12/02/19 0908    Subjective  Pt reports that his shoulder pain is getting better    Pertinent History Parkinson's Disease.  PMH:CAD, HTN, HDL, obesity, asthma, DM, stage 3 CKD, vitamin D deficiency, REM sleep disorder, OSA, aortic atherosclerosis, hx of gout, GERD, hx of anxiety    Patient Stated Goals improve L shoulder pain, use hands to pick up things easier, keep limber, move neck to L side, cut up meat    Currently in Pain? Yes    Pain Score 1     Pain Location Shoulder    Pain Orientation Left    Pain Descriptors / Indicators Aching    Pain Type Chronic pain    Pain Onset More than a month  ago    Pain Frequency Intermittent    Aggravating Factors  malpositioning, mornings    Pain Relieving Factors [place]                  Treatment : Ultrasound  56mhz, 1.0 w/cm 2, 50% x 8 mins to anterior and upper shoulder, upper arm.  No adverse reactions.  Cane Exercises with foam noodle for shoulder flex, abduction, ER, and shoulder ext/scapular retraction with min cueing intermittently.  Standing, rolling ball up wall with BUEs for AAROM shoulder flex for stretch.   Grooved pegs for RUE  fine motor coordination and in hand manipulation, min difficulty, removing with in hand manipulation, mod difficulty/ v.c, Pt performed with LUE min difficulty/ v.c                   OT Long Term  Goals - 11/11/19 0859      OT LONG TERM GOAL #1   Title Pt will be independent with updated PD-specific HEP. --check LTGs 12/12/19    Time 5    Period Weeks    Status New      OT LONG TERM GOAL #2   Title Pt will consistently demo at least 140* L shoulder flex with pain less than or equal to 2/10 without cues for proper positioning.    Baseline 135* with 3/10 pain    Time 5    Period Weeks    Status New      OT LONG TERM GOAL #3   Title Pt will improve R hand coordination for ADLs as shown by improving time on 9-hole peg test 4 sec.    Baseline 40.78sec    Time 5    Period Weeks    Status New      OT LONG TERM GOAL #4   Title Pt will verbalize understanding of adapted strategies for increased ease and independence wtih ADLS/ IADLs.    Time 5    Period Weeks    Status New      OT LONG TERM GOAL #5   Title Pt will report cutting up food as "moderately difficult" using strategies.    Baseline "very difficult"    Time 5    Period Weeks    Status New                 Plan - 12/02/19 0919    Clinical Impression Statement Pt is progressing towards goals. He demonstrates decreased overall shoulder pain.    OT Occupational Profile and History Detailed Assessment- Review of Records and additional review of physical, cognitive, psychosocial history related to current functional performance    Occupational performance deficits (Please refer to evaluation for details): ADL's;IADL's;Leisure    Body Structure / Function / Physical Skills ADL;Balance;IADL;ROM;Improper spinal/pelvic alignment;Mobility;Tone;UE functional use;Decreased knowledge of use of DME;Pain;Coordination;FMC   bradykinesia   Cognitive Skills Memory    Rehab Potential Good    Clinical Decision Making Several treatment options, min-mod task modification necessary    Comorbidities Affecting Occupational Performance: May have comorbidities impacting occupational performance    Modification or Assistance to Complete  Evaluation  Min-Moderate modification of tasks or assist with assess necessary to complete eval    OT Frequency 2x / week    OT Duration --   5 weeks   OT Treatment/Interventions Self-care/ADL training;DME and/or AE instruction;Balance training;Therapeutic activities;Aquatic Therapy;Therapeutic exercise;Cognitive remediation/compensation;Passive range of motion;Functional Mobility Training;Neuromuscular education;Manual Therapy;Patient/family education    Plan Ultrasound prn, cane exercises, focus on shoulder  positioning, coordination,    Consulted and Agree with Plan of Care Patient           Patient will benefit from skilled therapeutic intervention in order to improve the following deficits and impairments:   Body Structure / Function / Physical Skills: ADL, Balance, IADL, ROM, Improper spinal/pelvic alignment, Mobility, Tone, UE functional use, Decreased knowledge of use of DME, Pain, Coordination, FMC (bradykinesia) Cognitive Skills: Memory     Visit Diagnosis: Other symptoms and signs involving the musculoskeletal system  Other lack of coordination  Other symptoms and signs involving the nervous system  Abnormal posture  Chronic left shoulder pain  Stiffness of left shoulder, not elsewhere classified  Tremor    Problem List Patient Active Problem List   Diagnosis Date Noted  . Depression, major, recurrent, in partial remission ([place]) 11/11/2019  . Central sleep apnea due to Cheyne-Stokes respiration 08/31/2019  . Elevated PSA 08/13/2019  . Hyperlipidemia associated with type 2 diabetes mellitus ([place]) 07/09/2019  . Aortic atherosclerosis ([place]) 06/10/2018  . Controlled type 2 diabetes mellitus with diabetic dermatitis, without long-term current use of insulin ([place]) 01/21/2018  . OSA treated with BiPAP 01/03/2017  . REM sleep behavior disorder 12/06/2016  . Uncontrolled secondary diabetes mellitus with stage 3 CKD (GFR 30-59) (HCC) 10/23/2016  . Medication management  10/23/2016  . Vitamin D deficiency 10/23/2016  . Parkinson disease ([place]) 01/04/2016  . Seasonal and perennial allergic rhinitis 06/21/2014  . Asthma, mild intermittent, well-controlled 06/21/2014  . Pseudomonas aeruginosa colonization 10/08/2012  . CAD (coronary artery disease) 11/15/2011  . Hypertension 11/15/2011  . Hyperlipidemia 11/15/2011  . Morbid obesity with BMI of 40.0-44.9, adult ([place]) 11/15/2011    Todd Rice 12/02/2019, 9:25 AM  [facility] [address] [place], [place], [address] Phone: [phone]   Fax:  [phone]  Name: Todd Rice MRN: [id number] Date of Birth: 07-19-1955

## 2019-12-04 ENCOUNTER — Ambulatory Visit: Payer: Medicare Other | Admitting: Occupational Therapy

## 2019-12-04 ENCOUNTER — Other Ambulatory Visit: Payer: Self-pay

## 2019-12-04 ENCOUNTER — Encounter: Payer: Self-pay | Admitting: Occupational Therapy

## 2019-12-04 DIAGNOSIS — R2681 Unsteadiness on feet: Secondary | ICD-10-CM

## 2019-12-04 DIAGNOSIS — M25612 Stiffness of left shoulder, not elsewhere classified: Secondary | ICD-10-CM

## 2019-12-04 DIAGNOSIS — R29898 Other symptoms and signs involving the musculoskeletal system: Secondary | ICD-10-CM | POA: Diagnosis not present

## 2019-12-04 DIAGNOSIS — M25512 Pain in left shoulder: Secondary | ICD-10-CM | POA: Diagnosis not present

## 2019-12-04 DIAGNOSIS — R293 Abnormal posture: Secondary | ICD-10-CM

## 2019-12-04 DIAGNOSIS — R29818 Other symptoms and signs involving the nervous system: Secondary | ICD-10-CM | POA: Diagnosis not present

## 2019-12-04 DIAGNOSIS — R251 Tremor, unspecified: Secondary | ICD-10-CM | POA: Diagnosis not present

## 2019-12-04 DIAGNOSIS — R278 Other lack of coordination: Secondary | ICD-10-CM

## 2019-12-04 DIAGNOSIS — G8929 Other chronic pain: Secondary | ICD-10-CM

## 2019-12-04 DIAGNOSIS — R2689 Other abnormalities of gait and mobility: Secondary | ICD-10-CM | POA: Diagnosis not present

## 2019-12-04 NOTE — Therapy (Signed)
[facility] [address] [place], [place], [address] Phone: [phone]   Fax:  [phone]  Occupational Therapy Treatment  Patient Details  Name: NOBEL BRAR MRN: [id number] Date of Birth: 11/02/1955 Referring Provider (OT): Dr. Margette Fast   Encounter Date: 12/04/2019   OT End of Session - 12/04/19 1442    Visit Number 7    Number of Visits 10    Date for OT Re-Evaluation 12/11/19    Authorization Type UHC [place] 2021, no deductible, VL: [place], no auth    OT Start Time [phone]    OT Stop Time 0931    OT Time Calculation (min) 40 min    Activity Tolerance Patient tolerated treatment well    Behavior During Therapy [facility] for tasks assessed/performed           Past Medical History:  Diagnosis Date  . Adult BMI 50.0-59.9 kg/sq m    52.77  . Anal fissure   . Anxiety   . Diabetes mellitus without complication (HCC)    borderline  . GERD (gastroesophageal reflux disease)    30 years ago, maybe from peptic ulcer  . H/O: gout    STABLE  . Hepatic steatosis 10/01/11  . History of kidney stones   . Hyperlipidemia   . Hypertension   . Kidney tumor    right upper kidney tumor, monitoring  . OSA on CPAP    [facility]  . Parkinson disease ([place]) 01/04/2016  . REM sleep behavior disorder 12/06/2016  . Right ureteral stone   . Ureteral calculus, right 06/12/2011  . Vitamin D deficiency   . Weakness     Past Surgical History:  Procedure Laterality Date  . ANAL FISSURE REPAIR  10-12-2000  . COLONOSCOPY N/A 07/01/2012   Procedure: COLONOSCOPY;  Surgeon: Lafayette Dragon, MD;  [facility];  Service: Endoscopy;  Laterality: N/A;  . CYSTOSCOPY WITH RETROGRADE PYELOGRAM, URETEROSCOPY AND STENT PLACEMENT Bilateral 02/26/2013   Procedure: CYSTOSCOPY WITH RETROGRADE PYELOGRAM, URETEROSCOPY AND STENT PLACEMENT;  Surgeon: Alexis Frock, MD;  Location: [facility];  Service: Urology;  Laterality: Bilateral;  . HOLMIUM LASER  APPLICATION Bilateral 16/24/4695   Procedure: HOLMIUM LASER APPLICATION;  Surgeon: Alexis Frock, MD;  Location: [facility];  Service: Urology;  Laterality: Bilateral;  . kidney stone removal     06/2011-stent placement  . LEFT MEDIAL FEMORAL CONDYLE DEBRIDEMENT & DRILLING/ REMOVAL LOOSE BODY  09-15-2003  . NASAL SEPTUM SURGERY  1985  . STONE EXTRACTION WITH BASKET  06/12/2011   Procedure: STONE EXTRACTION WITH BASKET;  Surgeon: Claybon Jabs, MD;  Location: [facility];  Service: Urology;  Laterality: Right;    There were no vitals filed for this visit.   Subjective Assessment - 12/04/19 1441    Subjective  It's just tight (shoulder), but it's much better    Pertinent History Parkinson's Disease.  PMH:CAD, HTN, HDL, obesity, asthma, DM, stage 3 CKD, vitamin D deficiency, REM sleep disorder, OSA, aortic atherosclerosis, hx of gout, GERD, hx of anxiety    Patient Stated Goals improve L shoulder pain, use hands to pick up things easier, keep limber, move neck to L side, cut up meat    Currently in Pain? Yes    Pain Score 1     Pain Location Shoulder    Pain Orientation Left    Pain Descriptors / Indicators Aching    Pain Type Chronic pain    Pain Onset More than a month ago  Pain Frequency Intermittent    Aggravating Factors  malpositioning, mornings    Pain Relieving Factors ultrasound            Ultrasound  63mhz, 1.0 w/cm 2, 50% x 8 mins to anterior and upper shoulder, upper arm.  No adverse reactions.  Cane Exercises with foam noodle for shoulder flex, abduction, ER, and shoulder ext/scapular retraction with min cueing intermittently.  Standing, rolling ball up wall with BUEs for AAROM shoulder flex for stretch.   PWR! Moves in standing (up, rock, twist) with min cueing for large amplitude movements  Manipulating 3 objects in each hand to in sequence with min-mod difficulty initially, but improved with repetition.  Flipping card between each finger with mod  difficulty for individual finger movement/coordination.        OT Long Term Goals - 11/11/19 0859      OT LONG TERM GOAL #1   Title Pt will be independent with updated PD-specific HEP. --check LTGs 12/12/19    Time 5    Period Weeks    Status New      OT LONG TERM GOAL #2   Title Pt will consistently demo at least 140* L shoulder flex with pain less than or equal to 2/10 without cues for proper positioning.    Baseline 135* with 3/10 pain    Time 5    Period Weeks    Status New      OT LONG TERM GOAL #3   Title Pt will improve R hand coordination for ADLs as shown by improving time on 9-hole peg test 4 sec.    Baseline 40.78sec    Time 5    Period Weeks    Status New      OT LONG TERM GOAL #4   Title Pt will verbalize understanding of adapted strategies for increased ease and independence wtih ADLS/ IADLs.    Time 5    Period Weeks    Status New      OT LONG TERM GOAL #5   Title Pt will report cutting up food as "moderately difficult" using strategies.    Baseline "very difficult"    Time 5    Period Weeks    Status New                 Plan - 12/04/19 1443    Clinical Impression Statement Pt is progressing towards goals with improving coordination and improved L shoulder pain and ROM.    OT Occupational Profile and History Detailed Assessment- Review of Records and additional review of physical, cognitive, psychosocial history related to current functional performance    Occupational performance deficits (Please refer to evaluation for details): ADL's;IADL's;Leisure    Body Structure / Function / Physical Skills ADL;Balance;IADL;ROM;Improper spinal/pelvic alignment;Mobility;Tone;UE functional use;Decreased knowledge of use of DME;Pain;Coordination;FMC   bradykinesia   Cognitive Skills Memory    Rehab Potential Good    Clinical Decision Making Several treatment options, min-mod task modification necessary    Comorbidities Affecting Occupational Performance: May  have comorbidities impacting occupational performance    Modification or Assistance to Complete Evaluation  Min-Moderate modification of tasks or assist with assess necessary to complete eval    OT Frequency 2x / week    OT Duration --   5 weeks   OT Treatment/Interventions Self-care/ADL training;DME and/or AE instruction;Balance training;Therapeutic activities;Aquatic Therapy;Therapeutic exercise;Cognitive remediation/compensation;Passive range of motion;Functional Mobility Training;Neuromuscular education;Manual Therapy;Patient/family education    Plan ultrasound prn, shoulder positioning, coordination, begin checking goals and anticipate  d/c next week    Consulted and Agree with Plan of Care Patient           Patient will benefit from skilled therapeutic intervention in order to improve the following deficits and impairments:   Body Structure / Function / Physical Skills: ADL, Balance, IADL, ROM, Improper spinal/pelvic alignment, Mobility, Tone, UE functional use, Decreased knowledge of use of DME, Pain, Coordination, FMC (bradykinesia) Cognitive Skills: Memory     Visit Diagnosis: Other symptoms and signs involving the nervous system  Other lack of coordination  Other symptoms and signs involving the musculoskeletal system  Abnormal posture  Chronic left shoulder pain  Stiffness of left shoulder, not elsewhere classified  Tremor  Unsteadiness on feet    Problem List Patient Active Problem List   Diagnosis Date Noted  . Depression, major, recurrent, in partial remission ([place]) 11/11/2019  . Central sleep apnea due to Cheyne-Stokes respiration 08/31/2019  . Elevated PSA 08/13/2019  . Hyperlipidemia associated with type 2 diabetes mellitus ([place]) 07/09/2019  . Aortic atherosclerosis ([place]) 06/10/2018  . Controlled type 2 diabetes mellitus with diabetic dermatitis, without long-term current use of insulin ([place]) 01/21/2018  . OSA treated with BiPAP 01/03/2017  . REM sleep  behavior disorder 12/06/2016  . Uncontrolled secondary diabetes mellitus with stage 3 CKD (GFR 30-59) (HCC) 10/23/2016  . Medication management 10/23/2016  . Vitamin D deficiency 10/23/2016  . Parkinson disease ([place]) 01/04/2016  . Seasonal and perennial allergic rhinitis 06/21/2014  . Asthma, mild intermittent, well-controlled 06/21/2014  . Pseudomonas aeruginosa colonization 10/08/2012  . CAD (coronary artery disease) 11/15/2011  . Hypertension 11/15/2011  . Hyperlipidemia 11/15/2011  . Morbid obesity with BMI of 40.0-44.9, adult [facility]) 11/15/2011    [facility] 12/04/2019, 2:44 PM  [facility] [address] [place], [place], [address] Phone: [phone]   Fax:  [phone]  Name: JOSHUE BADAL MRN: [id number] Date of Birth: 1955/12/08   Vianne Bulls, OTR/L [facility] [address] [place], [place]  [address] [phone] phone [phone] 12/04/19 2:49 PM

## 2019-12-09 ENCOUNTER — Other Ambulatory Visit: Payer: Self-pay

## 2019-12-09 ENCOUNTER — Encounter: Payer: Self-pay | Admitting: Occupational Therapy

## 2019-12-09 ENCOUNTER — Ambulatory Visit: Payer: Medicare Other | Attending: Neurology | Admitting: Occupational Therapy

## 2019-12-09 DIAGNOSIS — R29818 Other symptoms and signs involving the nervous system: Secondary | ICD-10-CM | POA: Insufficient documentation

## 2019-12-09 DIAGNOSIS — R293 Abnormal posture: Secondary | ICD-10-CM | POA: Insufficient documentation

## 2019-12-09 DIAGNOSIS — R29898 Other symptoms and signs involving the musculoskeletal system: Secondary | ICD-10-CM | POA: Insufficient documentation

## 2019-12-09 DIAGNOSIS — R251 Tremor, unspecified: Secondary | ICD-10-CM | POA: Insufficient documentation

## 2019-12-09 DIAGNOSIS — R2681 Unsteadiness on feet: Secondary | ICD-10-CM | POA: Insufficient documentation

## 2019-12-09 DIAGNOSIS — G8929 Other chronic pain: Secondary | ICD-10-CM | POA: Diagnosis not present

## 2019-12-09 DIAGNOSIS — R278 Other lack of coordination: Secondary | ICD-10-CM | POA: Insufficient documentation

## 2019-12-09 DIAGNOSIS — M25512 Pain in left shoulder: Secondary | ICD-10-CM | POA: Insufficient documentation

## 2019-12-09 DIAGNOSIS — M25612 Stiffness of left shoulder, not elsewhere classified: Secondary | ICD-10-CM | POA: Insufficient documentation

## 2019-12-09 DIAGNOSIS — R2689 Other abnormalities of gait and mobility: Secondary | ICD-10-CM | POA: Diagnosis not present

## 2019-12-09 NOTE — Therapy (Signed)
[facility] [address] [place], [place], [address] Phone: [phone]   Fax:  [phone]  Occupational Therapy Treatment  Patient Details  Name: Todd Rice MRN: [id number] Date of Birth: [birth year] Referring Provider (OT): Dr. Margette Fast   Encounter Date: 12/09/2019   OT End of Session - 12/09/19 1542    Visit Number 8    Number of Visits 10    Date for OT Re-Evaluation 12/11/19    Authorization Type UHC [place] 2021, no deductible, VL: [place], no auth    OT Start Time [phone]    OT Stop Time 0808    OT Time Calculation (min) 35 min    Activity Tolerance Patient tolerated treatment well    Behavior During Therapy [facility] for tasks assessed/performed           Past Medical History:  Diagnosis Date  . Adult BMI 50.0-59.9 kg/sq m    52.77  . Anal fissure   . Anxiety   . Diabetes mellitus without complication (HCC)    borderline  . GERD (gastroesophageal reflux disease)    30 years ago, maybe from peptic ulcer  . H/O: gout    STABLE  . Hepatic steatosis 10/01/11  . History of kidney stones   . Hyperlipidemia   . Hypertension   . Kidney tumor    right upper kidney tumor, monitoring  . OSA on CPAP    [facility]  . Parkinson disease ([place]) 01/04/2016  . REM sleep behavior disorder 12/06/2016  . Right ureteral stone   . Ureteral calculus, right 06/12/2011  . Vitamin D deficiency   . Weakness     Past Surgical History:  Procedure Laterality Date  . ANAL FISSURE REPAIR  10-12-2000  . COLONOSCOPY N/A 07/01/2012   Procedure: COLONOSCOPY;  Surgeon: Lafayette Dragon, MD;  Location: [facility];  Service: Endoscopy;  Laterality: N/A;  . CYSTOSCOPY WITH RETROGRADE PYELOGRAM, URETEROSCOPY AND STENT PLACEMENT Bilateral 02/26/2013   Procedure: CYSTOSCOPY WITH RETROGRADE PYELOGRAM, URETEROSCOPY AND STENT PLACEMENT;  Surgeon: Alexis Frock, MD;  Location: [facility];  Service: Urology;  Laterality: Bilateral;  . HOLMIUM LASER  APPLICATION Bilateral 38/18/2993   Procedure: HOLMIUM LASER APPLICATION;  Surgeon: Alexis Frock, MD;  Location: [facility];  Service: Urology;  Laterality: Bilateral;  . kidney stone removal     06/2011-stent placement  . LEFT MEDIAL FEMORAL CONDYLE DEBRIDEMENT & DRILLING/ REMOVAL LOOSE BODY  09-15-2003  . NASAL SEPTUM SURGERY  1985  . STONE EXTRACTION WITH BASKET  06/12/2011   Procedure: STONE EXTRACTION WITH BASKET;  Surgeon: Claybon Jabs, MD;  Location: [facility];  Service: Urology;  Laterality: Right;    There were no vitals filed for this visit.   Subjective Assessment - 12/09/19 1539    Subjective  no pain, just tight in the morning and it's fine    Pertinent History Parkinson's Disease.  PMH:CAD, HTN, HDL, obesity, asthma, DM, stage 3 CKD, vitamin D deficiency, REM sleep disorder, OSA, aortic atherosclerosis, hx of gout, GERD, hx of anxiety    Patient Stated Goals improve L shoulder pain, use hands to pick up things easier, keep limber, move neck to L side, cut up meat    Currently in Pain? No/denies    Pain Onset More than a month ago              Ultrasound  67mhz, 1.0 w/cm 2, 50% x 8 mins to anterior and upper shoulder, upper arm.  No  adverse reactions.  Cane Exercises with foam noodle for shoulder flex, abduction with min cueing intermittently.  Functional reaching with each UE in standing with focus on large amplitude, trunk rotation, and wt. Shift to place large pegs in vertical pegboard, reaching floor>overhead.  Standing tossing medium ball with BUEs with focus on elbow ext and finger ext.  Bouncing ball with each hand with min cueing for amplitude.  Manipulating 3 objects in each hand to in sequence with minimal difficulty.  Ambulating while dealing cards with thumb to pass to the other hand with mod difficulty R hand and min difficulty L hand.         OT Long Term Goals - 11/11/19 0859      OT LONG TERM GOAL #1   Title Pt will be independent  with updated PD-specific HEP. --check LTGs 12/12/19    Time 5    Period Weeks    Status New      OT LONG TERM GOAL #2   Title Pt will consistently demo at least 140* L shoulder flex with pain less than or equal to 2/10 without cues for proper positioning.    Baseline 135* with 3/10 pain    Time 5    Period Weeks    Status New      OT LONG TERM GOAL #3   Title Pt will improve R hand coordination for ADLs as shown by improving time on 9-hole peg test 4 sec.    Baseline 40.78sec    Time 5    Period Weeks    Status New      OT LONG TERM GOAL #4   Title Pt will verbalize understanding of adapted strategies for increased ease and independence wtih ADLS/ IADLs.    Time 5    Period Weeks    Status New      OT LONG TERM GOAL #5   Title Pt will report cutting up food as "moderately difficult" using strategies.    Baseline "very difficult"    Time 5    Period Weeks    Status New                 Plan - 12/09/19 1542    Clinical Impression Statement Pt is making good progress with improving coordination and decr bradykinesia.    OT Occupational Profile and History Detailed Assessment- Review of Records and additional review of physical, cognitive, psychosocial history related to current functional performance    Occupational performance deficits (Please refer to evaluation for details): ADL's;IADL's;Leisure    Body Structure / Function / Physical Skills ADL;Balance;IADL;ROM;Improper spinal/pelvic alignment;Mobility;Tone;UE functional use;Decreased knowledge of use of DME;Pain;Coordination;FMC   bradykinesia   Cognitive Skills Memory    Rehab Potential Good    Clinical Decision Making Several treatment options, min-mod task modification necessary    Comorbidities Affecting Occupational Performance: May have comorbidities impacting occupational performance    Modification or Assistance to Complete Evaluation  Min-Moderate modification of tasks or assist with assess necessary to  complete eval    OT Frequency 2x / week    OT Duration --   5 weeks   OT Treatment/Interventions Self-care/ADL training;DME and/or AE instruction;Balance training;Therapeutic activities;Aquatic Therapy;Therapeutic exercise;Cognitive remediation/compensation;Passive range of motion;Functional Mobility Training;Neuromuscular education;Manual Therapy;Patient/family education    Plan check remaining goals and d/c, schedule PD screens    Consulted and Agree with Plan of Care Patient           Patient will benefit from skilled therapeutic intervention in order  to improve the following deficits and impairments:   Body Structure / Function / Physical Skills: ADL, Balance, IADL, ROM, Improper spinal/pelvic alignment, Mobility, Tone, UE functional use, Decreased knowledge of use of DME, Pain, Coordination, FMC (bradykinesia) Cognitive Skills: Memory     Visit Diagnosis: Other symptoms and signs involving the nervous system  Other lack of coordination  Other symptoms and signs involving the musculoskeletal system  Abnormal posture  Chronic left shoulder pain  Stiffness of left shoulder, not elsewhere classified  Unsteadiness on feet  Tremor  Other abnormalities of gait and mobility    Problem List Patient Active Problem List   Diagnosis Date Noted  . Depression, major, recurrent, in partial remission ([place]) 11/11/2019  . Central sleep apnea due to Cheyne-Stokes respiration 08/31/2019  . Elevated PSA 08/13/2019  . Hyperlipidemia associated with type 2 diabetes mellitus ([place]) 07/09/2019  . Aortic atherosclerosis ([place]) 06/10/2018  . Controlled type 2 diabetes mellitus with diabetic dermatitis, without long-term current use of insulin ([place]) 01/21/2018  . OSA treated with BiPAP 01/03/2017  . REM sleep behavior disorder 12/06/2016  . Uncontrolled secondary diabetes mellitus with stage 3 CKD (GFR 30-59) (HCC) 10/23/2016  . Medication management 10/23/2016  . Vitamin D deficiency  10/23/2016  . Parkinson disease ([place]) 01/04/2016  . Seasonal and perennial allergic rhinitis 06/21/2014  . Asthma, mild intermittent, well-controlled 06/21/2014  . Pseudomonas aeruginosa colonization 10/08/2012  . CAD (coronary artery disease) 11/15/2011  . Hypertension 11/15/2011  . Hyperlipidemia 11/15/2011  . Morbid obesity with BMI of 40.0-44.9, adult [facility]) 11/15/2011    [facility] 12/09/2019, 3:44 PM  [facility] [address] [place], [place], [address] Phone: [phone]   Fax:  [phone]  Name: KAYDE WAREHIME MRN: [id number] Date of Birth: 1956/03/30   Vianne Bulls, OTR/L [facility] [address] [place], [place]  [address] [phone] phone [phone] 12/09/19 3:44 PM

## 2019-12-11 ENCOUNTER — Ambulatory Visit: Payer: Medicare Other | Admitting: Occupational Therapy

## 2019-12-15 MED FILL — JANUVIA 100 MG TABLET: 100 | 30 days supply | Qty: 30 | Fill #6

## 2019-12-28 MED FILL — ALLOPURINOL 300 MG TABS: 300 | 30 days supply | Qty: 30 | Fill #3

## 2019-12-29 ENCOUNTER — Other Ambulatory Visit: Payer: Self-pay | Admitting: Physician Assistant

## 2019-12-29 DIAGNOSIS — G20 Parkinson's disease: Secondary | ICD-10-CM

## 2019-12-29 MED FILL — ALPRAZolam 0.5 MG TABS: 0.5 | 30 days supply | Qty: 90 | Fill #0

## 2019-12-29 MED FILL — VALSARTAN-HCTZ 320-25 MG TA: 320-25 | 30 days supply | Qty: 30 | Fill #10

## 2020-01-05 MED FILL — metFORMIN HCL ER 500 MG TB2: 500 | 90 days supply | Qty: 360 | Fill #2

## 2020-01-06 ENCOUNTER — Ambulatory Visit: Payer: Medicare Other | Admitting: Occupational Therapy

## 2020-01-06 ENCOUNTER — Encounter: Payer: Self-pay | Admitting: Occupational Therapy

## 2020-01-06 ENCOUNTER — Other Ambulatory Visit: Payer: Self-pay

## 2020-01-06 DIAGNOSIS — R2689 Other abnormalities of gait and mobility: Secondary | ICD-10-CM | POA: Diagnosis not present

## 2020-01-06 DIAGNOSIS — R2681 Unsteadiness on feet: Secondary | ICD-10-CM

## 2020-01-06 DIAGNOSIS — G8929 Other chronic pain: Secondary | ICD-10-CM

## 2020-01-06 DIAGNOSIS — R278 Other lack of coordination: Secondary | ICD-10-CM

## 2020-01-06 DIAGNOSIS — R293 Abnormal posture: Secondary | ICD-10-CM

## 2020-01-06 DIAGNOSIS — M25512 Pain in left shoulder: Secondary | ICD-10-CM | POA: Diagnosis not present

## 2020-01-06 DIAGNOSIS — R29898 Other symptoms and signs involving the musculoskeletal system: Secondary | ICD-10-CM

## 2020-01-06 DIAGNOSIS — M25612 Stiffness of left shoulder, not elsewhere classified: Secondary | ICD-10-CM | POA: Diagnosis not present

## 2020-01-06 DIAGNOSIS — R251 Tremor, unspecified: Secondary | ICD-10-CM | POA: Diagnosis not present

## 2020-01-06 DIAGNOSIS — R29818 Other symptoms and signs involving the nervous system: Secondary | ICD-10-CM | POA: Diagnosis not present

## 2020-01-06 NOTE — Therapy (Signed)
[facility] [address] [place], [place], [address] Phone: [phone]   Fax:  [phone]  Occupational Therapy Treatment  Patient Details  Name: Todd Rice MRN: [id number] Date of Birth: [birth year] Referring Provider (OT): Dr. Margette Fast   Encounter Date: 01/06/2020   OT End of Session - 01/06/20 0725    Visit Number 9    Number of Visits 10    Date for OT Re-Evaluation 12/11/19    Authorization Type UHC [place] 2021, no deductible, VL: [place], no auth    OT Start Time 0720    OT Stop Time 0800   d/c visit   OT Time Calculation (min) 40 min    Activity Tolerance Patient tolerated treatment well    Behavior During Therapy [facility] for tasks assessed/performed           Past Medical History:  Diagnosis Date  . Adult BMI 50.0-59.9 kg/sq m    52.77  . Anal fissure   . Anxiety   . Diabetes mellitus without complication (HCC)    borderline  . GERD (gastroesophageal reflux disease)    30 years ago, maybe from peptic ulcer  . H/O: gout    STABLE  . Hepatic steatosis 10/01/11  . History of kidney stones   . Hyperlipidemia   . Hypertension   . Kidney tumor    right upper kidney tumor, monitoring  . OSA on CPAP    [facility]  . Parkinson disease ([place]) 01/04/2016  . REM sleep behavior disorder 12/06/2016  . Right ureteral stone   . Ureteral calculus, right 06/12/2011  . Vitamin D deficiency   . Weakness     Past Surgical History:  Procedure Laterality Date  . ANAL FISSURE REPAIR  10-12-2000  . COLONOSCOPY N/A 07/01/2012   Procedure: COLONOSCOPY;  Surgeon: Lafayette Dragon, MD;  [facility];  Service: Endoscopy;  Laterality: N/A;  . CYSTOSCOPY WITH RETROGRADE PYELOGRAM, URETEROSCOPY AND STENT PLACEMENT Bilateral 02/26/2013   Procedure: CYSTOSCOPY WITH RETROGRADE PYELOGRAM, URETEROSCOPY AND STENT PLACEMENT;  Surgeon: Alexis Frock, MD;  [facility];  Service: Urology;  Laterality: Bilateral;  .  HOLMIUM LASER APPLICATION Bilateral 18/56/3149   Procedure: HOLMIUM LASER APPLICATION;  Surgeon: Alexis Frock, MD;  [facility];  Service: Urology;  Laterality: Bilateral;  . kidney stone removal     06/2011-stent placement  . LEFT MEDIAL FEMORAL CONDYLE DEBRIDEMENT & DRILLING/ REMOVAL LOOSE BODY  09-15-2003  . NASAL SEPTUM SURGERY  1985  . STONE EXTRACTION WITH BASKET  06/12/2011   Procedure: STONE EXTRACTION WITH BASKET;  Surgeon: Claybon Jabs, MD;  Location: [facility];  Service: Urology;  Laterality: Right;    There were no vitals filed for this visit.   Subjective Assessment - 01/06/20 0723    Subjective  mother passed away since last visit    Pertinent History Parkinson's Disease.  PMH:CAD, HTN, HDL, obesity, asthma, DM, stage 3 CKD, vitamin D deficiency, REM sleep disorder, OSA, aortic atherosclerosis, hx of gout, GERD, hx of anxiety    Patient Stated Goals improve L shoulder pain, use hands to pick up things easier, keep limber, move neck to L side, cut up meat    Currently in Pain? No/denies    Pain Onset More than a month ago           Checked goals and discussed progress and follow-up.   Shoulder flex AAROM on wall LUE for stretch, shoulder abduction with BUEs on wall  incorporating trunk rotation, L shoulder ER stretch on wall.  Flipping cards with BUEs simultaneously with mod cueing for incr effort/movement amplitude.   Reviewed strategies for cutting food and then practiced technique with simulated cutting food with good results.           OT Long Term Goals - 01/06/20 0731      OT LONG TERM GOAL #1   Title Pt will be independent with updated PD-specific HEP. --check LTGs 12/12/19    Time 5    Period Weeks    Status Achieved      OT LONG TERM GOAL #2   Title Pt will consistently demo at least 140* L shoulder flex with pain less than or equal to 2/10 without cues for proper positioning.    Baseline 135* with 3/10 pain    Time 5     Period Weeks    Status Achieved   01/06/20:  150*     OT LONG TERM GOAL #3   Title Pt will improve R hand coordination for ADLs as shown by improving time on 9-hole peg test 4 sec.    Baseline 40.78sec    Time 5    Period Weeks    Status Partially Met   01/06/20:  41.81sec, 32.37     OT LONG TERM GOAL #4   Title Pt will verbalize understanding of adapted strategies for increased ease and independence wtih ADLS/ IADLs.    Time 5    Period Weeks    Status Achieved      OT LONG TERM GOAL #5   Title Pt will report cutting up food as "moderately difficulty" using strategies.    Baseline "very difficult"    Time 5    Period Weeks    Status Achieved                 Plan - 01/06/20 0727    Clinical Impression Statement Pt demo improved coordination and LUE ROM without pain.    OT Occupational Profile and History Detailed Assessment- Review of Records and additional review of physical, cognitive, psychosocial history related to current functional performance    Occupational performance deficits (Please refer to evaluation for details): ADL's;IADL's;Leisure    Body Structure / Function / Physical Skills ADL;Balance;IADL;ROM;Improper spinal/pelvic alignment;Mobility;Tone;UE functional use;Decreased knowledge of use of DME;Pain;Coordination;FMC   bradykinesia   Cognitive Skills Memory    Rehab Potential Good    Clinical Decision Making Several treatment options, min-mod task modification necessary    Comorbidities Affecting Occupational Performance: May have comorbidities impacting occupational performance    Modification or Assistance to Complete Evaluation  Min-Moderate modification of tasks or assist with assess necessary to complete eval    OT Frequency 2x / week    OT Duration --   5 weeks   OT Treatment/Interventions Self-care/ADL training;DME and/or AE instruction;Balance training;Therapeutic activities;Aquatic Therapy;Therapeutic exercise;Cognitive  remediation/compensation;Passive range of motion;Functional Mobility Training;Neuromuscular education;Manual Therapy;Patient/family education    Plan d/c OT, recommend therapy screens in approx 6 months    Consulted and Agree with Plan of Care Patient           Patient will benefit from skilled therapeutic intervention in order to improve the following deficits and impairments:   Body Structure / Function / Physical Skills: ADL, Balance, IADL, ROM, Improper spinal/pelvic alignment, Mobility, Tone, UE functional use, Decreased knowledge of use of DME, Pain, Coordination, FMC (bradykinesia) Cognitive Skills: Memory     Visit Diagnosis: Other symptoms and signs involving the nervous  system  Other lack of coordination  Other symptoms and signs involving the musculoskeletal system  Abnormal posture  Chronic left shoulder pain  Stiffness of left shoulder, not elsewhere classified  Unsteadiness on feet  Other abnormalities of gait and mobility    Problem List Patient Active Problem List   Diagnosis Date Noted  . Depression, major, recurrent, in partial remission ([place]) 11/11/2019  . Central sleep apnea due to Cheyne-Stokes respiration 08/31/2019  . Elevated PSA 08/13/2019  . Hyperlipidemia associated with type 2 diabetes mellitus ([place]) 07/09/2019  . Aortic atherosclerosis ([place]) 06/10/2018  . Controlled type 2 diabetes mellitus with diabetic dermatitis, without long-term current use of insulin ([place]) 01/21/2018  . OSA treated with BiPAP 01/03/2017  . REM sleep behavior disorder 12/06/2016  . Uncontrolled secondary diabetes mellitus with stage 3 CKD (GFR 30-59) (HCC) 10/23/2016  . Medication management 10/23/2016  . Vitamin D deficiency 10/23/2016  . Parkinson disease ([place]) 01/04/2016  . Seasonal and perennial allergic rhinitis 06/21/2014  . Asthma, mild intermittent, well-controlled 06/21/2014  . Pseudomonas aeruginosa colonization 10/08/2012  . CAD (coronary artery disease)  11/15/2011  . Hypertension 11/15/2011  . Hyperlipidemia 11/15/2011  . Morbid obesity with BMI of 40.0-44.9, adult ([place]) 11/15/2011    OCCUPATIONAL THERAPY DISCHARGE SUMMARY  Visits from Start of Care: 9  Current functional level related to goals / functional outcomes: See above   Remaining deficits: Bradykinesia, rigidity, decr coordination, abnormal posture   Education / Equipment: Pt was instructed in the following:  PD-specific HEP, adaptive strategies for ADLs/IADLs, ways to prevent future complications, community resources.  Pt verbalized understanding of all education provided.   Plan: Patient agrees to discharge.  Patient goals were met. Patient is being discharged due to being pleased with the current functional level.  Pt would benefit from occupational therapy evaluation/screen in approx 6 months to assess for need for further therapy/functional changes due to progressive nature of diagnosis.       [facility] 01/06/2020, 3:26 PM  [facility] [address] [place], [place], [address] Phone: [phone]   Fax:  [phone]  Name: Todd Rice MRN: [id number] Date of Birth: [birth year]   Vianne Bulls, OTR/L [facility] [address] [place], [place]  [address] [phone] phone [phone] 01/06/20 3:26 PM

## 2020-01-13 MED FILL — JANUVIA 100 MG TABLET: 100 | 30 days supply | Qty: 30 | Fill #7

## 2020-01-16 DIAGNOSIS — G4733 Obstructive sleep apnea (adult) (pediatric): Secondary | ICD-10-CM | POA: Diagnosis not present

## 2020-01-29 MED FILL — VALSARTAN-HCTZ 320-25 MG TA: 320-25 | 30 days supply | Qty: 30 | Fill #11

## 2020-02-06 ENCOUNTER — Encounter: Payer: 59 | Admitting: Physician Assistant

## 2020-02-09 ENCOUNTER — Other Ambulatory Visit: Payer: Self-pay | Admitting: Physician Assistant

## 2020-02-09 ENCOUNTER — Other Ambulatory Visit: Payer: Self-pay | Admitting: Internal Medicine

## 2020-02-09 MED FILL — ALLOPURINOL 300 MG TABS: 300 | 90 days supply | Qty: 90 | Fill #0

## 2020-02-17 ENCOUNTER — Encounter: Payer: 59 | Admitting: Physician Assistant

## 2020-02-17 ENCOUNTER — Encounter: Payer: Medicare Other | Admitting: Adult Health

## 2020-02-17 NOTE — Progress Notes (Signed)
Complete Physical  Assessment and Plan:  Encounter for general adult medical examination with abnormal findings 1 year  Coronary artery disease involving native coronary artery of native heart without angina pectoris Control blood pressure, cholesterol, glucose, increase exercise.  Had reassuring  Get EKG Go to the ER if any chest pain, shortness of breath, nausea, dizziness, severe HA, changes vision/speech  Essential hypertension - continue medications, DASH diet, exercise and monitor at home. Call if greater than 130/80.  -     CBC with Differential/Platelet -     CMP/GFR -     TSH -     Urinalysis, Routine w reflex microscopic -     Microalbumin / creatinine urine ratio -     EKG 12-Lead  Controlled type 2 diabetes mellitus with diabetic dermatitis, without long-term current use of insulin ([place]) Discussed general issues about diabetes pathophysiology and management., Educational material distributed., Suggested low cholesterol diet., Encouraged aerobic exercise., Discussed foot care., Reminded to get yearly retinal exam.      -      HgbA1C      -      Diabetic foot exam   Hyperlipidemia, unspecified hyperlipidemia type -continue medications, check lipids, decrease fatty foods, increase activity.  -     Lipid panel  Class 3 obesity due to excess calories with serious comorbidity and body mass index (BMI) of 45.0 to 49.9 in adult [facility])  - long discussion about weight loss, diet, and exercise -     Lipid panel -     Hemoglobin A1c  Parkinson disease ([place]) Continue neuro follow up and meds  Medication management -     Magnesium  Vitamin D deficiency -     VITAMIN D 25 Hydroxy (Vit-D Deficiency, Fractures)  Seasonal and perennial allergic rhinitis - Allegra OTC, increase H20, allergy hygiene explained.  Asthma, mild intermittent, well-controlled Weight loss, PRN albuterol, avoid triggers  Right renal cell carcinoma (HCC) Dr. Tresa Moore following, active  monitoring  Pseudomonas aeruginosa colonization Monitor, urology following  OSA treated with BiPAP Dr. Brett Fairy following- continue BiPAP, helping with daytime fatigue, weight loss still advised.   REM sleep behavior disorder continue neuro follow up  Depression In remission off of medication; rare xanax use  Screening for prostate cancer -     PSA  Need for influenza vaccine High dose quadrivalent administered without complication today   Need for pneumococcal vaccine Prevnar 13 administered without complication today   Orders Placed This Encounter  Procedures  . Flu vaccine HIGH DOSE PF  . Pneumococcal conjugate vaccine 13-valent  . CBC with Differential/Platelet  . COMPLETE METABOLIC PANEL WITH GFR  . Magnesium  . Lipid panel  . TSH  . Hemoglobin A1c  . VITAMIN D 25 Hydroxy (Vit-D Deficiency, Fractures)  . PSA  . Microalbumin / creatinine urine ratio  . Urinalysis, Routine w reflex microscopic  . EKG 12-Lead  . HM DIABETES FOOT EXAM    Discussed med's effects and SE's. Screening labs and tests as requested with regular follow-up as recommended.  Future Appointments  Date Time Provider [place]  03/29/2020  7:30 AM Kathrynn Ducking, MD [facility] None  06/29/2020  1:15 PM Frazier Butt, PT [facility]  06/29/2020  1:30 PM Delton Prairie, OT [facility]  06/29/2020  1:45 PM Cori Razor [facility]  07/15/2020 10:30 AM [name], NP [facility] None  02/17/2021  2:00 PM [name], NP [facility] None     HPI 64 y.o.  male patient presents for a complete physical. has CAD (coronary artery disease); Hypertension; Morbid obesity with BMI of 40.0-44.9, adult ([place]); Pseudomonas aeruginosa colonization; Seasonal and perennial allergic rhinitis; Asthma, mild intermittent, well-controlled; Parkinson disease ([place]); CKD stage 3 due to type 2 diabetes mellitus ([place]); Medication management; Vitamin D deficiency; REM sleep behavior  disorder; OSA treated with BiPAP; Controlled type 2 diabetes mellitus with diabetic dermatitis, without long-term current use of insulin ([place]); Aortic atherosclerosis ([place]); Hyperlipidemia associated with type 2 diabetes mellitus ([place]); Elevated PSA; Central sleep apnea due to Cheyne-Stokes respiration; and Depression, major, recurrent, in partial remission ([place]) on their problem list.  He is married, here with his wife Dawn today. He is retired Engineer, [employer].  No children. Has dogs.   He has depression in remission, formerly on wellbutrin but reports doing fairly off of this med. He has xanax that he uses occasionally, typically 2-3 tabs per month. Rare use of ambien for sleep.   He is on BiPAP for OSA per Dr. Brett Fairy, endorses 100% compliance and restorative sleep.   He had MRCP with Dr. Ardis Hughs for pancreatic cyst in May 2020, and will have repeat in 2 years.   Also has R renal papillary carcinoma, Follows annually with Dr. Tresa Moore, has been stable for several years.   Follows with neurology Dr. [name] for parkinson. He reports that he has been taking the mirapex and sinemet and feels well controlled. Does continue to have significant L hand tremor. Recently completed PT.   He is on miralax once a day for constipation, soft but occasionally still doesn't move.  Has mild asthma, has albuterol uses q2-3 days PRN if needed when outdoors and ragweed season.    BMI is Body mass index is 45.13 kg/m., he has been working on diet and exercise. Wt Readings from Last 3 Encounters:  02/18/20 271 lb 3.2 oz (123 kg)  11/12/19 278 lb 9.6 oz (126.4 kg)  09/15/19 274 lb 8 oz (124.5 kg)   Aortic atherosclerosis per CXR 2020. Normal stress test 2013 by Dr. Percival Spanish due to CAD per CT.  His blood pressure has been controlled at home, today their BP is BP: 138/82 He does workout. He denies chest pain, shortness of breath, dizziness.  He is on cholesterol medication (rosuvastatin 40 mg daily but  admits forgets to take, hasn't taken in a week) and denies myalgias. His cholesterol is not at goal of 70 or less. The cholesterol last visit was:   Lab Results  Component Value Date   CHOL 183 11/12/2019   HDL 32 (L) 11/12/2019   LDLCALC 102 (H) 11/12/2019   TRIG 367 (H) 11/12/2019   CHOLHDL 5.7 (H) 11/12/2019   He has been working on diet and exercise for diabetes CKD, he is on ARB Hyperlipemia on crestor Aortic atherosclerosis he is on bASA he is on metformin 4 a day  [place] at lunch - but expensive - in donut hole - needs samples  Only takes glipizide if his sugar is 150+ in AM takes 1/2, 200+ takes whole if 200+ His sugars have been running 130-145, lowest 90 Has hypoglycemic awareness Eye Exam 2021, Dr. Sherral Hammers  denies foot ulcerations, hyperglycemia, hypoglycemia , increased appetite, nausea, paresthesia of the feet, polydipsia, polyuria, visual disturbances, vomiting and weight loss.  Last A1C in the office was:  Lab Results  Component Value Date   HGBA1C 7.0 (H) 11/12/2019   He has CKD II associated with T2DM monitored at this office:  Lab Results  Component Value Date   GFRNONAA 61 11/12/2019   Patient is on Vitamin D supplement.   Lab Results  Component Value Date   VD25OH 91 08/13/2019   Hx of PSA elevation, follows with Dr. Tresa Moore. Denies LUTS.  Lab Results  Component Value Date   PSA 2.7 08/13/2019     Current Medications:   Current Outpatient Medications (Endocrine & Metabolic):  .  glipiZIDE (GLUCOTROL) 5 MG tablet, Take 1 tablet (5 mg total) by mouth 2 (two) times daily before a meal. .  JANUVIA 100 MG tablet, Take 100 mg by mouth daily. .  metFORMIN (GLUCOPHAGE-XR) 500 MG 24 hr tablet, TAKE 2 TABLETS 2 TIMES A DAY FOR DIABETES  Current Outpatient Medications (Cardiovascular):  .  ezetimibe (ZETIA) 10 MG tablet, Take 1 tablet (10 mg total) by mouth daily. .  rosuvastatin (CRESTOR) 40 MG tablet, Take 1 tablet (40 mg total) by mouth daily. .   valsartan-hydrochlorothiazide (DIOVAN-HCT) 320-25 MG tablet, Take 1 tablet Daily for BP  Current Outpatient Medications (Respiratory):  .  mometasone (NASONEX) 50 MCG/ACT nasal spray, Place 2 sprays into the nose daily. .  VENTOLIN HFA 108 (90 Base) MCG/ACT inhaler, INHALE 2 PUFFS INTO THE LUNGS EVERY 6 HOURS AS NEEDED FOR WHEEZING OR SHORTNESS OF BREATH.  Current Outpatient Medications (Analgesics):  .  allopurinol (ZYLOPRIM) 300 MG tablet, Take       1 tablet       Daily       to Prevent Gout .  aspirin EC 81 MG tablet, Take 81 mg by mouth at bedtime.   Current Outpatient Medications (Other):  Marland Kitchen  ALPRAZolam (XANAX) 0.5 MG tablet, Take 1 tablet (0.5 mg total) by mouth 3 (three) times daily as needed. .  carbidopa-levodopa (SINEMET) 25-250 MG tablet, Take 1 tablet by mouth 3 (three) times daily. .  pramipexole (MIRAPEX) 1 MG tablet, Take 1 tablet (1 mg total) by mouth 3 (three) times daily. .  vitamin C (ASCORBIC ACID) 500 MG tablet, Take 1 tablet (500 mg total) by mouth 3 (three) times daily. Marland Kitchen  VITAMIN D PO, Take 5,000 Units by mouth 2 (two) times daily. Marland Kitchen  zolpidem (AMBIEN) 5 MG tablet, Take 1 tablet (5 mg total) by mouth at bedtime as needed for sleep.  Health Maintenance:  Immunization History  Administered Date(s) Administered  . Influenza Inj Mdck Quad With Preservative 02/22/2017, 01/21/2018, 02/06/2019  . Influenza-Unspecified 02/03/2013, 02/05/2014  . PFIZER SARS-COV-2 Vaccination 06/14/2019, 07/09/2019  . Pneumococcal-Unspecified 11/05/2012  . Tdap 03/26/2015     Tetanus: 2016 Pneumovax: 2014, 1 year Prevnar 13: TODAY  Flu vaccine: 02/2019, DUE TODAY Covid 19: 2/2, 2021, pfizer   Colonoscopy: 2014 Sleep study 2017 CTA chest 2014 CXR 06/2018 MRI brain 2017 Stress test 2013 ABD MRI/MRCP 09/2018 - pancreatic cyst, interval enlargement, Dr. Ardis Hughs recommended 2 year follow up MRI through his office  IMPRESSION: 1.4 cm simple appearing cystic lesion in pancreatic  body, mildly increased in since prior studies dating back to 2015. Smaller cystic lesions in the pancreatic body and tail appear to have been present on prior MRI although less well visualized due to their small size. 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 Consider continued follow-up by MRI in 1 year or endoscopic ultrasound with FNA. This recommendation follows ACR consensus guidelines: Management of Incidental Pancreatic Cysts: A White Paper of the ACR Incidental Findings Committee. [place] 3474;25:956-387.  Stable 2.0 cm right renal mass, consistent with papillary renal  cell Carcinoma.  - Dr. Tresa Moore following annually   No evidence of abdominal metastatic disease. Moderate hepatic steatosis  Last eye exam: 2021, Dr. Sherral Hammers, will request report Last dental exam: Dr. Geroge Baseman, had 02/2020 Last derm exam: Was following with Dr. Ronnald Ramp, has been severaly   [name], [name] Keene Breath., [name], [name], MD (Inactive) as Consulting Physician (Gastroenterology)   Medical History:  Past Medical History:  Diagnosis Date  . Adult BMI 50.0-59.9 kg/sq m    52.77  . Anal fissure   . Anxiety   . Diabetes mellitus without complication (HCC)    borderline  . GERD (gastroesophageal reflux disease)    30 years ago, maybe from peptic ulcer  . H/O: gout    STABLE  . Hepatic steatosis 10/01/11  . History of kidney stones   . Hyperlipidemia   . Hypertension   . Kidney tumor    right upper kidney tumor, monitoring  . OSA on CPAP    [facility]  . Parkinson disease ([place]) 01/04/2016  . REM sleep behavior disorder 12/06/2016  . Right ureteral stone   . Ureteral calculus, right 06/12/2011  . Vitamin D deficiency   . Weakness    Allergies Allergies  Allergen Reactions  . Ciprofloxacin Hives  .  Ceftriaxone Hives    SURGICAL HISTORY He  has a past surgical history that includes Anal fissure repair (10-12-2000); Nasal septum surgery (1985); LEFT MEDIAL FEMORAL CONDYLE DEBRIDEMENT & DRILLING/ REMOVAL LOOSE BODY (09-15-2003); Stone extraction with basket (06/12/2011); kidney stone removal; Colonoscopy (N/A, 07/01/2012); Cystoscopy with retrograde pyelogram, ureteroscopy and stent placement (Bilateral, 02/26/2013); and Holmium laser application (Bilateral, 02/26/2013). FAMILY HISTORY His family history includes Cancer in his father; Heart disease in his maternal aunt, mother, and paternal grandmother; Hyperlipidemia in his brother, maternal aunt, mother, and paternal grandmother; Hypertension in his maternal aunt and mother; Lung cancer in his father; Stroke in his maternal aunt. SOCIAL HISTORY He  reports that he has never smoked. He has never used smokeless tobacco. He reports that he does not drink alcohol and does not use drugs.  Review of Systems:  Review of Systems  Constitutional: Negative for malaise/fatigue and weight loss.  HENT: Negative for hearing loss and tinnitus.   Eyes: Negative for blurred vision and double vision.  Respiratory: Negative for cough, shortness of breath and wheezing.   Cardiovascular: Negative for chest pain, palpitations, orthopnea, claudication and leg swelling.  Gastrointestinal: Negative for abdominal pain, blood in stool, constipation, diarrhea, heartburn, melena, nausea and vomiting.  Genitourinary: Negative.   Musculoskeletal: Negative for joint pain and myalgias.  Skin: Negative for rash.  Neurological: Positive for tremors (L hand). Negative for dizziness, tingling, sensory change, weakness and headaches.  Endo/Heme/Allergies: Negative for polydipsia.  Psychiatric/Behavioral: Negative for depression and memory loss. The patient has insomnia (occasionally ). The patient is not nervous/anxious ([place]).   All other systems reviewed and are  negative.   Physical Exam: Estimated body mass index is 45.13 kg/m as calculated from the following:   Height as of this encounter: 5\' 5"  (1.651 m).   Weight as of this encounter: 271 lb 3.2 oz (123 kg). BP 138/82   Pulse 60   Temp 97.9 F (36.6 C)   Ht 5\' 5"  (1.651 m)   Wt 271 lb 3.2 oz (123 kg)   SpO2 98%   BMI 45.13 kg/m   General Appearance:  Well nourished, well dressed morbidly obese male in no apparent distress.  Eyes: PERRLA, EOMs, conjunctiva no swelling or erythema ENT/Mouth: Ear canals clear bilaterally with no erythema, swelling, discharge.  TMs normal bilaterally with no erythema, bulging, or retractions.  Oropharynx clear and moist with no exudate, swelling, or erythema.  Dentition normal.   Neck: Supple, thyroid normal. No bruits, JVD, cervical adenopathy Respiratory: Respiratory effort normal, BS equal bilaterally without rales, rhonchi, wheezing or stridor.  Cardio: RRR without murmurs, rubs or gallops. Brisk peripheral pulses without edema.  Chest: symmetric, with normal excursions Abdomen: Soft, morbidly obese limiting exam, nontender, no guarding, rebound, hernias, masses, or organomegaly. Genitourinary: deferred today  Musculoskeletal: Full ROM all peripheral extremities,5/5 strength, and normal gait.  Skin: Warm, dry without rashes, lesions, ecchymosis. Neuro: A&Ox3, Cranial nerves intact, slow gait.  Overshoot of the right hand with finger to nose.  Tremor of the left hand with finger opposition>tremor of the right hand with finger opposition.    Psych: Flat affect, Insight and Judgment appropriate.   EKG: WNL no changes.   Over 40 minutes of exam, counseling, chart review and critical decision making was performed  Izora Ribas 2:30 PM [facility]

## 2020-02-18 ENCOUNTER — Other Ambulatory Visit: Payer: Self-pay

## 2020-02-18 ENCOUNTER — Ambulatory Visit (INDEPENDENT_AMBULATORY_CARE_PROVIDER_SITE_OTHER): Payer: Medicare Other | Admitting: Adult Health

## 2020-02-18 ENCOUNTER — Encounter: Payer: Self-pay | Admitting: Adult Health

## 2020-02-18 VITALS — BP 138/82 | HR 60 | Temp 97.9°F | Ht 65.0 in | Wt 271.2 lb

## 2020-02-18 DIAGNOSIS — C641 Malignant neoplasm of right kidney, except renal pelvis: Secondary | ICD-10-CM | POA: Insufficient documentation

## 2020-02-18 DIAGNOSIS — Z136 Encounter for screening for cardiovascular disorders: Secondary | ICD-10-CM

## 2020-02-18 DIAGNOSIS — G4733 Obstructive sleep apnea (adult) (pediatric): Secondary | ICD-10-CM

## 2020-02-18 DIAGNOSIS — K760 Fatty (change of) liver, not elsewhere classified: Secondary | ICD-10-CM | POA: Insufficient documentation

## 2020-02-18 DIAGNOSIS — J3089 Other allergic rhinitis: Secondary | ICD-10-CM

## 2020-02-18 DIAGNOSIS — E559 Vitamin D deficiency, unspecified: Secondary | ICD-10-CM | POA: Diagnosis not present

## 2020-02-18 DIAGNOSIS — N183 Chronic kidney disease, stage 3 unspecified: Secondary | ICD-10-CM | POA: Diagnosis not present

## 2020-02-18 DIAGNOSIS — E1169 Type 2 diabetes mellitus with other specified complication: Secondary | ICD-10-CM

## 2020-02-18 DIAGNOSIS — R063 Periodic breathing: Secondary | ICD-10-CM

## 2020-02-18 DIAGNOSIS — J4520 Mild intermittent asthma, uncomplicated: Secondary | ICD-10-CM

## 2020-02-18 DIAGNOSIS — Z1329 Encounter for screening for other suspected endocrine disorder: Secondary | ICD-10-CM

## 2020-02-18 DIAGNOSIS — I10 Essential (primary) hypertension: Secondary | ICD-10-CM | POA: Diagnosis not present

## 2020-02-18 DIAGNOSIS — E1122 Type 2 diabetes mellitus with diabetic chronic kidney disease: Secondary | ICD-10-CM | POA: Diagnosis not present

## 2020-02-18 DIAGNOSIS — Z125 Encounter for screening for malignant neoplasm of prostate: Secondary | ICD-10-CM

## 2020-02-18 DIAGNOSIS — E11620 Type 2 diabetes mellitus with diabetic dermatitis: Secondary | ICD-10-CM | POA: Diagnosis not present

## 2020-02-18 DIAGNOSIS — Z0000 Encounter for general adult medical examination without abnormal findings: Secondary | ICD-10-CM | POA: Diagnosis not present

## 2020-02-18 DIAGNOSIS — I2510 Atherosclerotic heart disease of native coronary artery without angina pectoris: Secondary | ICD-10-CM

## 2020-02-18 DIAGNOSIS — I700 Atherosclerosis of aorta: Secondary | ICD-10-CM

## 2020-02-18 DIAGNOSIS — Z1389 Encounter for screening for other disorder: Secondary | ICD-10-CM

## 2020-02-18 DIAGNOSIS — J302 Other seasonal allergic rhinitis: Secondary | ICD-10-CM

## 2020-02-18 DIAGNOSIS — G20 Parkinson's disease: Secondary | ICD-10-CM

## 2020-02-18 DIAGNOSIS — F3341 Major depressive disorder, recurrent, in partial remission: Secondary | ICD-10-CM

## 2020-02-18 DIAGNOSIS — Z79899 Other long term (current) drug therapy: Secondary | ICD-10-CM

## 2020-02-18 DIAGNOSIS — R972 Elevated prostate specific antigen [PSA]: Secondary | ICD-10-CM

## 2020-02-18 DIAGNOSIS — Z23 Encounter for immunization: Secondary | ICD-10-CM | POA: Diagnosis not present

## 2020-02-18 HISTORY — DX: Malignant neoplasm of right kidney, except renal pelvis: C64.1

## 2020-02-18 NOTE — Patient Instructions (Addendum)
[name] , Thank you for taking time to come for your Medicare Wellness Visit. I appreciate your ongoing commitment to your health goals. Please review the following plan we discussed and let me know if I can assist you in the future.   This is a list of the screening recommended for you and due dates:  Health Maintenance  Topic Date Due  . Pneumonia vaccines (1 of 2 - PCV13) 06/02/2019  . Eye exam for diabetics  10/01/2019  . Flu Shot  12/07/2019  .  Hepatitis C: One time screening is recommended by Center for Disease Control  (CDC) for  adults born from [birth year] through 1966.   02/17/2021*  . HIV Screening  02/17/2021*  . Hemoglobin A1C  05/14/2020  . Complete foot exam   02/17/2021  . Colon Cancer Screening  07/01/2022  . Tetanus Vaccine  03/25/2025  . COVID-19 Vaccine  Completed  *Topic was postponed. The date shown is not the original due date.      Senokot - add with miralax to help move bowels daily      Know what a healthy weight is for you (roughly BMI <25) and aim to maintain this  Aim for 7+ servings of fruits and vegetables daily  65-80+ fluid ounces of water or unsweet tea for healthy kidneys  Limit to max 1 drink of alcohol per day; avoid smoking/tobacco  Limit animal fats in diet for cholesterol and heart health - choose grass fed whenever available  Avoid highly processed foods, and foods high in saturated/trans fats  Aim for low stress - take time to unwind and care for your mental health  Aim for 150 min of moderate intensity exercise weekly for heart health, and weights twice weekly for bone health  Aim for 7-9 hours of sleep daily      High-Fiber Diet Fiber, also called dietary fiber, is a type of carbohydrate that is found in fruits, vegetables, whole grains, and beans. A high-fiber diet can have many health benefits. Your health care provider may recommend a high-fiber diet to help:  Prevent constipation. Fiber can make your bowel movements  more regular.  Lower your cholesterol.  Relieve the following conditions: ? Swelling of veins in the anus (hemorrhoids). ? Swelling and irritation (inflammation) of specific areas of the digestive tract (uncomplicated diverticulosis). ? A problem of the large intestine (colon) that sometimes causes pain and diarrhea (irritable bowel syndrome, IBS).  Prevent overeating as part of a weight-loss plan.  Prevent heart disease, type 2 diabetes, and certain cancers. What is my plan? The recommended daily fiber intake in grams (g) includes:  38 g for men age 41 or younger.  30 g for men over age 9.  25 g for women age 82 or younger.  21 g for women over age 33. You can get the recommended daily intake of dietary fiber by:  Eating a variety of fruits, vegetables, grains, and beans.  Taking a fiber supplement, if it is not possible to get enough fiber through your diet. What do I need to know about a high-fiber diet?  It is better to get fiber through food sources rather than from fiber supplements. There is not a lot of research about how effective supplements are.  Always check the fiber content on the nutrition facts label of any prepackaged food. Look for foods that contain 5 g of fiber or more per serving.  Talk with a diet and nutrition specialist (dietitian) if you have questions  about specific foods that are recommended or not recommended for your medical condition, especially if those foods are not listed below.  Gradually increase how much fiber you consume. If you increase your intake of dietary fiber too quickly, you may have bloating, cramping, or gas.  Drink plenty of water. Water helps you to digest fiber. What are tips for following this plan?  Eat a wide variety of high-fiber foods.  Make sure that half of the grains that you eat each day are whole grains.  Eat breads and cereals that are made with whole-grain flour instead of refined flour or white flour.  Eat  brown rice, bulgur wheat, or millet instead of white rice.  Start the day with a breakfast that is high in fiber, such as a cereal that contains 5 g of fiber or more per serving.  Use beans in place of meat in soups, salads, and pasta dishes.  Eat high-fiber snacks, such as berries, raw vegetables, nuts, and popcorn.  Choose whole fruits and vegetables instead of processed forms like juice or sauce. What foods can I eat?  Fruits Berries. Pears. Apples. Oranges. Avocado. Prunes and raisins. Dried figs. Vegetables Sweet potatoes. Spinach. Kale. Artichokes. Cabbage. Broccoli. Cauliflower. Green peas. Carrots. Squash. Grains Whole-grain breads. Multigrain cereal. Oats and oatmeal. Brown rice. Barley. Bulgur wheat. [place]. Quinoa. Bran muffins. Popcorn. Rye wafer crackers. Meats and other proteins Navy, kidney, and pinto beans. Soybeans. Split peas. Lentils. Nuts and seeds. Dairy Fiber-fortified yogurt. Beverages Fiber-fortified soy milk. Fiber-fortified orange juice. Other foods Fiber bars. The items listed above may not be a complete list of recommended foods and beverages. Contact a dietitian for more options. What foods are not recommended? Fruits Fruit juice. Cooked, strained fruit. Vegetables Fried potatoes. Canned vegetables. Well-cooked vegetables. Grains White bread. Pasta made with refined flour. White rice. Meats and other proteins Fatty cuts of meat. Fried chicken or fried fish. Dairy Milk. Yogurt. Cream cheese. Sour cream. Fats and oils Butters. Beverages Soft drinks. Other foods Cakes and pastries. The items listed above may not be a complete list of foods and beverages to avoid. Contact a dietitian for more information. Summary  Fiber is a type of carbohydrate. It is found in fruits, vegetables, whole grains, and beans.  There are many health benefits of eating a high-fiber diet, such as preventing constipation, lowering blood cholesterol, helping with weight  loss, and reducing your risk of heart disease, diabetes, and certain cancers.  Gradually increase your intake of fiber. Increasing too fast can result in cramping, bloating, and gas. Drink plenty of water while you increase your fiber.  The best sources of fiber include whole fruits and vegetables, whole grains, nuts, seeds, and beans. This information is not intended to replace advice given to you by your health care provider. Make sure you discuss any questions you have with your health care provider. Document Revised: 02/26/2017 Document Reviewed: 02/26/2017 Elsevier Patient Education  2020 [employer].

## 2020-02-19 LAB — URINALYSIS, ROUTINE W REFLEX MICROSCOPIC
Bilirubin Urine: NEGATIVE
Glucose, UA: NEGATIVE
Hgb urine dipstick: NEGATIVE
Ketones, ur: NEGATIVE
Nitrite: POSITIVE — AB
Protein, ur: NEGATIVE
RBC / HPF: NONE SEEN /HPF (ref 0–2)
Specific Gravity, Urine: 1.01 (ref 1.001–1.03)
pH: 6 (ref 5.0–8.0)

## 2020-02-19 LAB — CBC WITH DIFFERENTIAL/PLATELET
Absolute Monocytes: 806 cells/uL (ref 200–950)
Basophils Absolute: 40 cells/uL (ref 0–200)
Basophils Relative: 0.5 %
Eosinophils Absolute: 150 cells/uL (ref 15–500)
Eosinophils Relative: 1.9 %
HCT: 45.4 % (ref 38.5–50.0)
Hemoglobin: 15.5 g/dL (ref 13.2–17.1)
Lymphs Abs: 2086 cells/uL (ref 850–3900)
MCH: 29 pg (ref 27.0–33.0)
MCHC: 34.1 g/dL (ref 32.0–36.0)
MCV: 85 fL (ref 80.0–100.0)
MPV: 10.7 fL (ref 7.5–12.5)
Monocytes Relative: 10.2 %
Neutro Abs: 4819 cells/uL (ref 1500–7800)
Neutrophils Relative %: 61 %
Platelets: 176 10*3/uL (ref 140–400)
RBC: 5.34 10*6/uL (ref 4.20–5.80)
RDW: 13.7 % (ref 11.0–15.0)
Total Lymphocyte: 26.4 %
WBC: 7.9 10*3/uL (ref 3.8–10.8)

## 2020-02-19 LAB — COMPLETE METABOLIC PANEL WITH GFR
AG Ratio: 1.3 (calc) (ref 1.0–2.5)
ALT: 13 U/L (ref 9–46)
AST: 16 U/L (ref 10–35)
Albumin: 4.2 g/dL (ref 3.6–5.1)
Alkaline phosphatase (APISO): 55 U/L (ref 35–144)
BUN/Creatinine Ratio: 16 (calc) (ref 6–22)
BUN: 21 mg/dL (ref 7–25)
CO2: 27 mmol/L (ref 20–32)
Calcium: 10.2 mg/dL (ref 8.6–10.3)
Chloride: 98 mmol/L (ref 98–110)
Creat: 1.34 mg/dL — ABNORMAL HIGH (ref 0.70–1.25)
GFR, Est African American: 64 mL/min/{1.73_m2} (ref >=60)
GFR, Est Non African American: 55 mL/min/{1.73_m2} — ABNORMAL LOW (ref >=60)
Globulin: 3.3 g/dL (calc) (ref 1.9–3.7)
Glucose, Bld: 121 mg/dL — ABNORMAL HIGH (ref 65–99)
Potassium: 4.2 mmol/L (ref 3.5–5.3)
Sodium: 137 mmol/L (ref 135–146)
Total Bilirubin: 0.5 mg/dL (ref 0.2–1.2)
Total Protein: 7.5 g/dL (ref 6.1–8.1)

## 2020-02-19 LAB — PSA: PSA: 2.08 ng/mL (ref <=4.0)

## 2020-02-19 LAB — LIPID PANEL
Cholesterol: 180 mg/dL (ref <200)
HDL: 30 mg/dL — ABNORMAL LOW (ref >=40)
Non-HDL Cholesterol (Calc): 150 mg/dL (calc) — ABNORMAL HIGH (ref <130)
Total CHOL/HDL Ratio: 6 (calc) — ABNORMAL HIGH (ref <5.0)
Triglycerides: 417 mg/dL — ABNORMAL HIGH (ref <150)

## 2020-02-19 LAB — MICROALBUMIN / CREATININE URINE RATIO
Creatinine, Urine: 77 mg/dL (ref 20–320)
Microalb Creat Ratio: 4 mcg/mg creat (ref <30)
Microalb, Ur: 0.3 mg/dL

## 2020-02-19 LAB — MAGNESIUM: Magnesium: 1.9 mg/dL (ref 1.5–2.5)

## 2020-02-19 LAB — HEMOGLOBIN A1C
Hgb A1c MFr Bld: 6.9 % of total Hgb — ABNORMAL HIGH (ref <5.7)
Mean Plasma Glucose: 151 (calc)
eAG (mmol/L): 8.4 (calc)

## 2020-02-19 LAB — TSH: TSH: 3.43 mIU/L (ref 0.40–4.50)

## 2020-02-19 LAB — VITAMIN D 25 HYDROXY (VIT D DEFICIENCY, FRACTURES): Vit D, 25-Hydroxy: 54 ng/mL (ref 30–100)

## 2020-03-01 ENCOUNTER — Other Ambulatory Visit: Payer: Self-pay | Admitting: Internal Medicine

## 2020-03-01 DIAGNOSIS — I10 Essential (primary) hypertension: Secondary | ICD-10-CM

## 2020-03-01 MED FILL — ALBUTEROL SULFATE HFA 108 (: 108 (90 BAS | 49 days supply | Qty: 26 | Fill #0

## 2020-03-01 MED FILL — CARBIDOPA-LEVODOPA 25-250 T: 25-250 | 90 days supply | Qty: 270 | Fill #2

## 2020-03-01 MED FILL — VALSARTAN-HCTZ 320-25 MG TA: 320-25 | 90 days supply | Qty: 90 | Fill #0

## 2020-03-02 ENCOUNTER — Encounter: Payer: Self-pay | Admitting: Adult Health

## 2020-03-02 ENCOUNTER — Ambulatory Visit (INDEPENDENT_AMBULATORY_CARE_PROVIDER_SITE_OTHER): Payer: Medicare Other | Admitting: Adult Health

## 2020-03-02 ENCOUNTER — Other Ambulatory Visit: Payer: Self-pay | Admitting: Adult Health

## 2020-03-02 ENCOUNTER — Other Ambulatory Visit: Payer: Self-pay

## 2020-03-02 VITALS — BP 136/82 | HR 65 | Temp 97.3°F | Wt 270.0 lb

## 2020-03-02 DIAGNOSIS — Z2239 Carrier of other specified bacterial diseases: Secondary | ICD-10-CM | POA: Diagnosis not present

## 2020-03-02 DIAGNOSIS — R3989 Other symptoms and signs involving the genitourinary system: Secondary | ICD-10-CM

## 2020-03-02 DIAGNOSIS — R300 Dysuria: Secondary | ICD-10-CM | POA: Diagnosis not present

## 2020-03-02 MED ORDER — PHENAZOPYRIDINE HCL 200 MG PO TABS: 200.0000 mg | ORAL_TABLET | Freq: Three times a day (TID) | ORAL | 0 refills | Status: DC | PRN

## 2020-03-02 MED FILL — PHENAZOPYRIDINE 200 MG TAB: 200 | 6 days supply | Qty: 20 | Fill #0

## 2020-03-02 NOTE — Progress Notes (Signed)
Assessment and Plan:  Jaxtyn was seen today for acute visit.  Diagnoses and all orders for this visit:  Urethralgia Pseudomonas aeruginosa colonization New mild urethralgia, benign hx with normal exam, no discharge, declined STD testing today  Unclear etiology; will check UA/culture; given pyridium for comfort, STOP after 48 hours Continue to encourage water intake, avoid caffeine, artificial sweeteners Hx of pseudomonas colonization with resistance, only sensitive to IV abx; discussed plan to have follow up with Dr. Tresa Moore for recommendations if persistent sx Follow up for any change/progression in sx; ED if any high fever, rigors, confusion -     Urinalysis, Routine w reflex microscopic -     Urine Culture -     phenazopyridine (PYRIDIUM) 200 MG tablet; Take 1 tablet (200 mg total) by mouth 3 (three) times daily as needed for pain.   Further disposition pending results of labs. Discussed med's effects and SE's.   Over 15 minutes of exam, counseling, chart review, and critical decision making was performed.   Future Appointments  Date Time Provider [place]  03/06/2020  9:15 AM [place] [facility]  03/29/2020  7:30 AM Kathrynn Ducking, MD [facility] None  05/27/2020 11:15 AM [name], NP [facility] None  06/29/2020  1:15 PM Frazier Butt, PT [facility] [place]  06/29/2020  1:30 PM Delton Prairie, OT [facility]  06/29/2020  1:45 PM Schinke, [place], CCC-SLP [facility]  02/17/2021  2:00 PM [name], NP [facility] None    ------------------------------------------------------------------------------------------------------------------   HPI BP 136/82    Pulse 65    Temp (!) 97.3 F (36.3 C)    Wt 270 lb (122.5 kg)    SpO2 97%    BMI 44.93 kg/m   64 y.o.male with hx of renal calculi, pseudomonas colonization followed by Dr. Tresa Moore presents for evaluation of new burning at urethral meatus, mild intermittent frequency x 3 days.  Accompanied by his wife.   He reports has been pushing water intake recently due to last kidney function, urinating more than usual, had been going every 45 min-60 min, then started noting mild burning of the urethral meatus that was constant, not worse with passing urine, also mild sense of frequency but without any urine.   Denies urethral discharge, does endorse some intermittent cloudy urine, has improved with pushing urine, this is chronic for him for several years following pseudomonas colonization. Denies fever/chills, rigors, back/flank pain, no straining, dribbling, perineal or groin pain. Denies hx of STIs, new partners.    He has been working on diet and exercise for T2 diabetes, not on SGLT 2, and denies hyperglycemia, hypoglycemia , increased appetite, nausea, paresthesia of the feet, polydipsia and visual disturbances. Last A1C in the office was:  Lab Results  Component Value Date   HGBA1C 6.9 (H) 02/18/2020      Past Medical History:  Diagnosis Date   Adult BMI 50.0-59.9 kg/sq m    52.77   Anal fissure    Anxiety    Diabetes mellitus without complication (HCC)    borderline   GERD (gastroesophageal reflux disease)    30 years ago, maybe from peptic ulcer   H/O: gout    STABLE   Hepatic steatosis 10/01/11   History of kidney stones    Hyperlipidemia    Hypertension    Kidney tumor    right upper kidney tumor, monitoring   OSA on CPAP    [facility]   Parkinson disease ([place]) 01/04/2016   REM  sleep behavior disorder 12/06/2016   Right ureteral stone    Ureteral calculus, right 06/12/2011   Vitamin D deficiency    Weakness      Allergies  Allergen Reactions   Ciprofloxacin Hives   Ceftriaxone Hives    Current Outpatient Medications on File Prior to Visit  Medication Sig   albuterol (VENTOLIN HFA) 108 (90 Base) MCG/ACT inhaler Use      2 inhalations      15 minutes  apart      every 4 hours      to rescue Asthma   allopurinol (ZYLOPRIM)  300 MG tablet Take       1 tablet       Daily       to Prevent Gout   ALPRAZolam (XANAX) 0.5 MG tablet Take 1 tablet (0.5 mg total) by mouth 3 (three) times daily as needed.   aspirin EC 81 MG tablet Take 81 mg by mouth at bedtime.   carbidopa-levodopa (SINEMET) 25-250 MG tablet Take 1 tablet by mouth 3 (three) times daily.   ezetimibe (ZETIA) 10 MG tablet Take 1 tablet (10 mg total) by mouth daily.   JANUVIA 100 MG tablet Take 100 mg by mouth daily.   metFORMIN (GLUCOPHAGE-XR) 500 MG 24 hr tablet TAKE 2 TABLETS 2 TIMES A DAY FOR DIABETES   mometasone (NASONEX) 50 MCG/ACT nasal spray Place 2 sprays into the nose daily.   pramipexole (MIRAPEX) 1 MG tablet Take 1 tablet (1 mg total) by mouth 3 (three) times daily.   rosuvastatin (CRESTOR) 40 MG tablet Take 1 tablet (40 mg total) by mouth daily.   valsartan-hydrochlorothiazide (DIOVAN-HCT) 320-25 MG tablet Take    1 tablet    Daily     for BP   vitamin C (ASCORBIC ACID) 500 MG tablet Take 1 tablet (500 mg total) by mouth 3 (three) times daily.   VITAMIN D PO Take 5,000 Units by mouth 2 (two) times daily.   zolpidem (AMBIEN) 5 MG tablet Take 1 tablet (5 mg total) by mouth at bedtime as needed for sleep.   glipiZIDE (GLUCOTROL) 5 MG tablet Take 1 tablet (5 mg total) by mouth 2 (two) times daily before a meal.   No current facility-administered medications on file prior to visit.    ROS: all negative except above.   Physical Exam:  BP 136/82    Pulse 65    Temp (!) 97.3 F (36.3 C)    Wt 270 lb (122.5 kg)    SpO2 97%    BMI 44.93 kg/m   General Appearance: Well dressed, good hygiene, morbidly obese male in no apparent distress. Eyes: PERRLA, conjunctiva no swelling or erythema ENT/Mouth: Mask in place; Hearing normal.  Neck: Supple Respiratory: Respiratory effort normal, BS equal bilaterally without rales, rhonchi, wheezing or stridor.  Cardio: RRR with no MRGs. Brisk peripheral pulses without edema.  Abdomen: Soft, obese  abdomen + BS.  Non tender, no guarding. Lymphatics: Non tender without lymphadenopathy.  Musculoskeletal: normal gait.  Skin: Warm, dry without rashes, lesions, ecchymosis.  Neuro: Normal muscle tone, Sensation intact. Resting R hand tremor.  Psych: Awake and oriented X 3, normal affect, Insight and Judgment appropriate.  Male genitalia: no penile lesions or discharge no testicular masses no bladder distension noted Penis: normal, no lesions and circumcised Urethral Meatus: normal Testicles: normal, no masses Scrotum: normal Epididymis: normal Skin of perineum: normal Rectal examination: exam declined by patient Prostate examination: exam declined by patient  Izora Ribas, NP 4:14 PM [facility]

## 2020-03-02 NOTE — Patient Instructions (Signed)
Urethritis, Adult  Urethritis is swelling (inflammation) of the urethra. The urethra is the tube that drains urine from the bladder. It is important to get treatment for this condition early. Delayed treatment may lead to complications. What are the causes? This condition may be caused by:  Germs that are spread through sexual contact. This is the leading cause of urethritis. This may include bacterial or viral infections.  Injury to the urethra. Injury can happen after a thin, flexible tube (catheter) is inserted into the urethra to drain urine, or after medical instruments or foreign bodies are inserted into the area.  Chemical irritation. This may include contact with spermicide.  A disease that causes inflammation. This is rare. What increases the risk? The following factors may make you more likely to develop this condition:  Having sex without using a condom.  Having multiple sexual partners.  Having poor hygiene. What are the signs or symptoms? Symptoms of this condition include:  Pain with urination.  Frequent urination.  Urgent need to urinate.  Itching and pain in the vagina or penis.  Discharge coming from the penis. However, women rarely have symptoms. How is this diagnosed? This condition is diagnosed based on your medical history and symptoms as well as a physical exam. Tests may also be done. These may include:  Urine tests.  Swabs from the urethra. How is this treated? Treatment for this condition depends on the cause. Urethritis caused by a bacterial infection is treated with antibiotic medicine. Any sexual partners must also be treated. Follow these instructions at home: Medicines  Take over-the-counter and prescription medicines only as told by your health care provider.  If you were prescribed antibiotic medicine, take it as told by your health care provider. Do not stop taking the antibiotic even if you start to feel  better. Lifestyle  Avoid using perfumed soaps, bubble bath, and shampoo when you bathe or shower. Rinse the vaginal area after bathing.  Wear cotton underwear. Not wearing underwear when going to bed can help.  Make sure to wipe from front to back after using the toilet if you are male.  Do not have sex until your health care provider approves. When you do have sex, be sure to practice safe sex.  Tell anyone with whom you have had sexual relations in the past 60 days that he or she may be at risk of infection. General instructions  Drink enough fluid to keep your urine clear or pale yellow.  It is up to you to get your test results. Ask your health care provider, or the department that is doing the test, when your results will be ready.  Keep all follow-up visits as told by your health care provider. This is important.  Get tested again 3 months after treatment to make sure the infection is gone. It is important that your sexual partner also gets tested again. Contact a health care provider if:  Your symptoms have not improved after 3 days.  Your symptoms get worse.  You have eye redness or pain.  You develop abdominal pain or pelvic pain (in females).  You develop joint pain.  You have a fever. Get help right away if:  You have severe pain in the belly, back, or side.  You vomit repeatedly. Summary  Urethritis is a swelling (inflammation) of the urethra.  This condition is caused by germs that are spread through sexual contact. This is the main cause of this illness.  It is important  to get treatment for this condition early. Delayed treatment may lead to complications.  Treatment for this condition depends on the cause. Any sexual partners must also be treated. This information is not intended to replace advice given to you by your health care provider. Make sure you discuss any questions you have with your health care provider. Document Revised: 04/06/2017  Document Reviewed: 05/30/2016 Elsevier Patient Education  2020 [employer].

## 2020-03-05 LAB — URINALYSIS, ROUTINE W REFLEX MICROSCOPIC
Bilirubin Urine: NEGATIVE
Glucose, UA: NEGATIVE
Hgb urine dipstick: NEGATIVE
Hyaline Cast: NONE SEEN /LPF
Ketones, ur: NEGATIVE
Nitrite: POSITIVE — AB
Protein, ur: NEGATIVE
RBC / HPF: NONE SEEN /HPF (ref 0–2)
Specific Gravity, Urine: 1.011 (ref 1.001–1.03)
Squamous Epithelial / HPF: NONE SEEN /HPF (ref <=5)
pH: 6.5 (ref 5.0–8.0)

## 2020-03-05 LAB — URINE CULTURE
MICRO NUMBER:: 11121808
SPECIMEN QUALITY:: ADEQUATE

## 2020-03-06 ENCOUNTER — Ambulatory Visit: Payer: Medicare Other | Attending: Internal Medicine

## 2020-03-06 DIAGNOSIS — Z23 Encounter for immunization: Secondary | ICD-10-CM

## 2020-03-06 NOTE — Progress Notes (Signed)
   Covid-19 Vaccination Clinic  Name:  Todd Rice    MRN: [id number] DOB: Jul 10, 1955  03/06/2020  Todd Rice was observed post Covid-19 immunization for 15 minutes without incident. He was provided with Vaccine Information Sheet and instruction to access the V-Safe system.   Todd Rice was instructed to call 911 with any severe reactions post vaccine: Marland Kitchen Difficulty breathing  . Swelling of face and throat  . A fast heartbeat  . A bad rash all over body  . Dizziness and weakness

## 2020-03-11 DIAGNOSIS — N3020 Other chronic cystitis without hematuria: Secondary | ICD-10-CM | POA: Diagnosis not present

## 2020-03-16 ENCOUNTER — Other Ambulatory Visit (HOSPITAL_COMMUNITY)
Admission: RE | Admit: 2020-03-16 | Discharge: 2020-03-16 | Disposition: A | Discharge status: Home / Self Care | Payer: Medicare Other | Source: Ambulatory Visit | Attending: Infectious Disease | Admitting: Infectious Disease

## 2020-03-16 ENCOUNTER — Ambulatory Visit: Payer: Medicare Other | Admitting: Infectious Disease

## 2020-03-16 ENCOUNTER — Other Ambulatory Visit: Payer: Self-pay

## 2020-03-16 ENCOUNTER — Encounter: Payer: Self-pay | Admitting: Infectious Disease

## 2020-03-16 VITALS — BP 143/83 | HR 83 | Wt 271.0 lb

## 2020-03-16 DIAGNOSIS — Z2239 Carrier of other specified bacterial diseases: Secondary | ICD-10-CM | POA: Diagnosis not present

## 2020-03-16 DIAGNOSIS — E1122 Type 2 diabetes mellitus with diabetic chronic kidney disease: Secondary | ICD-10-CM | POA: Insufficient documentation

## 2020-03-16 DIAGNOSIS — N183 Chronic kidney disease, stage 3 unspecified: Secondary | ICD-10-CM | POA: Insufficient documentation

## 2020-03-16 DIAGNOSIS — Z114 Encounter for screening for human immunodeficiency virus [HIV]: Secondary | ICD-10-CM | POA: Insufficient documentation

## 2020-03-16 DIAGNOSIS — E11620 Type 2 diabetes mellitus with diabetic dermatitis: Secondary | ICD-10-CM

## 2020-03-16 DIAGNOSIS — Z113 Encounter for screening for infections with a predominantly sexual mode of transmission: Secondary | ICD-10-CM | POA: Diagnosis not present

## 2020-03-16 DIAGNOSIS — Z1159 Encounter for screening for other viral diseases: Secondary | ICD-10-CM | POA: Diagnosis not present

## 2020-03-16 DIAGNOSIS — G20 Parkinson's disease: Secondary | ICD-10-CM

## 2020-03-16 NOTE — Progress Notes (Signed)
Subjective:   Reason for infectious disease consult: Pseudomonas aeruginosa colonization  Requesting physician: Dr. Alexis Frock MD   Patient ID: Todd Rice, male    DOB: 02-10-1956, 64 y.o.   MRN: [id number]  HPI  Todd Rice is a very pleasant 64 year old man who suffers from parkinsonism, fatty liver disease chronic kidney disease who has had prior problems with Pseudomonas aeruginosa colonization and was seen by my partner Todd Rice for this in June 2014.  It sounds like he had actual infection that was complicated by the presence of stones that were removed by urology back in 2014 a has not been treated to his knowledge with antibiotics since then.  He has not really had much in the way of symptoms concerning for urinary tract infection since then either.  He did recently have frequent urination and with the increasing frequency of urination did have some burning associated with this.  He was seen by primary care who did a urinalysis which was positive for nitrites.  The culture grew greater than 100,000 colony-forming units of Pseudomonas aeruginosa that was resistant to ciprofloxacin.  He had previously been treated with ciprofloxacin levofloxacin though he apparently had allergic reaction to ciprofloxacin.  He was referred to Todd Rice who saw the patient and at that time the patient's symptoms had resolved after having taken Pyridium prescribed by parent primary care.  Todd Rice again obtained a urine culture which had multiple more organisms.  The patient of note also has a renal cell neoplasm based on imaging which is being followed for now.  He also has had a pancreatic cyst seen on scan.  He does not have suprapubic tenderness his dysuria has resolved with the resolution of his frequent urination.  He has not had fevers or flank pain.     Past Medical History:  Diagnosis Date  . Adult BMI 50.0-59.9 kg/sq m    52.77  . Anal fissure   . Anxiety   . Diabetes  mellitus without complication (HCC)    borderline  . GERD (gastroesophageal reflux disease)    30 years ago, maybe from peptic ulcer  . H/O: gout    STABLE  . Hepatic steatosis 10/01/11  . History of kidney stones   . Hyperlipidemia   . Hypertension   . Kidney tumor    right upper kidney tumor, monitoring  . OSA on CPAP    [facility]  . Parkinson disease ([place]) 01/04/2016  . REM sleep behavior disorder 12/06/2016  . Right ureteral stone   . Ureteral calculus, right 06/12/2011  . Vitamin D deficiency   . Weakness     Past Surgical History:  Procedure Laterality Date  . ANAL FISSURE REPAIR  10-12-2000  . COLONOSCOPY N/A 07/01/2012   Procedure: COLONOSCOPY;  Surgeon: Todd Dragon, MD;  Location: [facility];  Service: Endoscopy;  Laterality: N/A;  . CYSTOSCOPY WITH RETROGRADE PYELOGRAM, URETEROSCOPY AND STENT PLACEMENT Bilateral 02/26/2013   Procedure: CYSTOSCOPY WITH RETROGRADE PYELOGRAM, URETEROSCOPY AND STENT PLACEMENT;  Surgeon: Todd Frock, MD;  Location: [facility];  Service: Urology;  Laterality: Bilateral;  . HOLMIUM LASER APPLICATION Bilateral 43/[id number]/[id number]   Procedure: HOLMIUM LASER APPLICATION;  Surgeon: Todd Frock, MD;  Location: [facility];  Service: Urology;  Laterality: Bilateral;  . kidney stone removal     06/2011-stent placement  . LEFT MEDIAL FEMORAL CONDYLE DEBRIDEMENT & DRILLING/ REMOVAL LOOSE BODY  09-15-2003  . NASAL SEPTUM SURGERY  1985  . STONE EXTRACTION WITH BASKET  06/12/2011  Procedure: STONE EXTRACTION WITH BASKET;  Surgeon: Todd Jabs, MD;  [facility];  Service: Urology;  Laterality: Right;    Family History  Problem Relation Age of Onset  . Heart disease Mother        Atrial fibrillation  . Hypertension Mother   . Hyperlipidemia Mother   . Lung cancer Father        was a smoker  . Cancer Father        lung  . Hyperlipidemia Brother   . Heart disease Maternal Aunt   . Hyperlipidemia Maternal Aunt   . Hypertension  Maternal Aunt   . Stroke Maternal Aunt   . Heart disease Paternal Grandmother   . Hyperlipidemia Paternal Grandmother       Social History   Socioeconomic History  . Marital status: Married    Spouse name: Not on file  . Number of children: 0  . Years of education: Not on file  . Highest education level: Not on file  Occupational History  . Not on file  Tobacco Use  . Smoking status: Former Research scientist (life sciences)  . Smokeless tobacco: Never Used  Vaping Use  . Vaping Use: Never used  Substance and Sexual Activity  . Alcohol use: No  . Drug use: No    Comment: QUIT SMOKING " POT" 40 YRS AGO  . Sexual activity: Yes    Partners: Female    Birth control/protection: Post-menopausal  Other Topics Concern  . Not on file  Social History Narrative   Right-handed.   No caffeine use.   Lives at home with his wife.   Social Determinants of Health   Financial Resource Strain:   . Difficulty of Paying Living Expenses: Not on file  Food Insecurity:   . Worried About Charity fundraiser in the Last Year: Not on file  . Ran Out of Food in the Last Year: Not on file  Transportation Needs:   . Lack of Transportation (Medical): Not on file  . Lack of Transportation (Non-Medical): Not on file  Physical Activity:   . Days of Exercise per Week: Not on file  . Minutes of Exercise per Session: Not on file  Stress:   . Feeling of Stress : Not on file  Social Connections:   . Frequency of Communication with Friends and Family: Not on file  . Frequency of Social Gatherings with Friends and Family: Not on file  . Attends Religious Services: Not on file  . Active Member of Clubs or Organizations: Not on file  . Attends Archivist Meetings: Not on file  . Marital Status: Not on file    Allergies  Allergen Reactions  . Ciprofloxacin Hives  . Ceftriaxone Hives     Current Outpatient Medications:  .  albuterol (VENTOLIN HFA) 108 (90 Base) MCG/ACT inhaler, Use      2 inhalations      15  minutes  apart      every 4 hours      to rescue Asthma, Disp: 48 g, Rfl: 0 .  allopurinol (ZYLOPRIM) 300 MG tablet, Take       1 tablet       Daily       to Prevent Gout, Disp: 90 tablet, Rfl: 0 .  ALPRAZolam (XANAX) 0.5 MG tablet, Take 1 tablet (0.5 mg total) by mouth 3 (three) times daily as needed., Disp: 90 tablet, Rfl: 0 .  aspirin EC 81 MG tablet, Take 81 mg  by mouth at bedtime., Disp: , Rfl:  .  carbidopa-levodopa (SINEMET) 25-250 MG tablet, Take 1 tablet by mouth 3 (three) times daily., Disp: 270 tablet, Rfl: 3 .  ezetimibe (ZETIA) 10 MG tablet, Take 1 tablet (10 mg total) by mouth daily., Disp: 90 tablet, Rfl: 1 .  JANUVIA 100 MG tablet, Take 100 mg by mouth daily., Disp: , Rfl:  .  metFORMIN (GLUCOPHAGE-XR) 500 MG 24 hr tablet, TAKE 2 TABLETS 2 TIMES A DAY FOR DIABETES, Disp: 360 tablet, Rfl: 3 .  mometasone (NASONEX) 50 MCG/ACT nasal spray, Place 2 sprays into the nose daily., Disp: 17 g, Rfl: 12 .  pramipexole (MIRAPEX) 1 MG tablet, Take 1 tablet (1 mg total) by mouth 3 (three) times daily., Disp: 270 tablet, Rfl: 3 .  rosuvastatin (CRESTOR) 40 MG tablet, Take 1 tablet (40 mg total) by mouth daily., Disp: 90 tablet, Rfl: 1 .  valsartan-hydrochlorothiazide (DIOVAN-HCT) 320-25 MG tablet, Take    1 tablet    Daily     for BP, Disp: 90 tablet, Rfl: 0 .  vitamin C (ASCORBIC ACID) 500 MG tablet, Take 1 tablet (500 mg total) by mouth 3 (three) times daily., Disp: 21 tablet, Rfl: 0 .  VITAMIN D PO, Take 5,000 Units by mouth 2 (two) times daily., Disp: , Rfl:  .  zolpidem (AMBIEN) 5 MG tablet, Take 1 tablet (5 mg total) by mouth at bedtime as needed for sleep., Disp: 30 tablet, Rfl: 0 .  glipiZIDE (GLUCOTROL) 5 MG tablet, Take 1 tablet (5 mg total) by mouth 2 (two) times daily before a meal., Disp: 60 tablet, Rfl: 2 .  phenazopyridine (PYRIDIUM) 200 MG tablet, Take 1 tablet (200 mg total) by mouth 3 (three) times daily as needed for pain. (Patient not taking: Reported on 03/16/2020), Disp: 20  tablet, Rfl: 0   Review of Systems  Constitutional: Negative for chills and fever.  HENT: Negative for congestion and sore throat.   Eyes: Negative for photophobia.  Respiratory: Negative for cough, shortness of breath and wheezing.   Cardiovascular: Negative for chest pain, palpitations and leg swelling.  Gastrointestinal: Negative for abdominal pain, blood in stool, constipation, diarrhea, nausea and vomiting.  Genitourinary: Negative for discharge, dysuria, flank pain, hematuria, penile pain, penile swelling and scrotal swelling.  Musculoskeletal: Negative for back pain and myalgias.  Skin: Negative for rash.  Neurological: Positive for tremors. Negative for dizziness, weakness and headaches.  Hematological: Does not bruise/bleed easily.  Psychiatric/Behavioral: Negative for agitation and suicidal ideas. The patient is not nervous/anxious.        Objective:   Physical Exam Constitutional:      General: He is not in acute distress.    Appearance: Normal appearance. He is well-developed. He is not ill-appearing or diaphoretic.  HENT:     Head: Normocephalic and atraumatic.     Right Ear: Hearing and external ear normal.     Left Ear: Hearing and external ear normal.     Nose: No nasal deformity or rhinorrhea.  Eyes:     General: No scleral icterus.    Conjunctiva/sclera: Conjunctivae normal.     Right eye: Right conjunctiva is not injected.     Left eye: Left conjunctiva is not injected.  Neck:     Vascular: No JVD.  Cardiovascular:     Rate and Rhythm: Normal rate and regular rhythm.     Heart sounds: S1 normal and S2 normal.  Pulmonary:     Effort: Pulmonary effort is normal. No  respiratory distress.     Breath sounds: No wheezing.  Abdominal:     General: There is no distension.     Palpations: Abdomen is soft.  Musculoskeletal:        General: Normal range of motion.     Right shoulder: Normal.     Left shoulder: Normal.     Cervical back: Normal range of motion  and neck supple.     Right hip: Normal.     Left hip: Normal.     Right knee: Normal.     Left knee: Normal.  Lymphadenopathy:     Head:     Right side of head: No submandibular, preauricular or posterior auricular adenopathy.     Left side of head: No submandibular, preauricular or posterior auricular adenopathy.     Cervical: No cervical adenopathy.     Right cervical: No superficial or deep cervical adenopathy.    Left cervical: No superficial or deep cervical adenopathy.  Skin:    General: Skin is warm and dry.     Coloration: Skin is not pale.     Findings: No abrasion, bruising, ecchymosis, erythema, lesion or rash.     Nails: There is no clubbing.  Neurological:     General: No focal deficit present.     Mental Status: He is alert and oriented to person, place, and time.     Sensory: No sensory deficit.     Coordination: Coordination normal.     Gait: Gait normal.  Psychiatric:        Attention and Perception: He is attentive.        Mood and Affect: Mood normal.        Speech: Speech normal.        Behavior: Behavior normal. Behavior is cooperative.        Thought Content: Thought content normal.        Judgment: Judgment normal.           Assessment & Plan:  Pseudomonas aeruginosa colonization: He seems to avoided serious urinary tract infection since removal of stones in 2014.  He has recent symptoms of dysuria that happened when he was having frequent urination resolved with use of Pyridium and without introduction of antimicrobials.  His Pseudomonas is now resistant to fluoroquinolones to which he had allergic reaction in any case.  I have counseled him that should he develop evidence of early urinary tract infection symptoms such as suprapubic pain and consistent dysuria we could try to treat him with oral fosfomycin.  I counseled him on more serious symptoms such as flank pain fevers and other things that make [place] concerned about an ascending infection which  could require systemic IV antibiotics.  STI screening he had been offered this at PCP but actually had not declined it.  We will screen him for GC and chlamydia through urine I will also check him for thoroughness for HIV hepatitis C and B.  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  Parkinsonism currently on medications still with a bit of a tremor.  Vaccinations he is up-to-date on vaccinations  He can return to clinic as needed.

## 2020-03-17 LAB — URINE CYTOLOGY ANCILLARY ONLY
Chlamydia: NEGATIVE
Comment: NEGATIVE
Comment: NORMAL
Neisseria Gonorrhea: NEGATIVE

## 2020-03-17 LAB — HEPATITIS B SURFACE ANTIGEN: Hepatitis B Surface Ag: NONREACTIVE

## 2020-03-17 LAB — HEPATITIS C ANTIBODY
Hepatitis C Ab: NONREACTIVE
SIGNAL TO CUT-OFF: 0.06 (ref <1.00)

## 2020-03-17 LAB — HEPATITIS B SURFACE ANTIBODY, QUANTITATIVE: Hep B S AB Quant (Post): <5 m[IU]/mL — ABNORMAL LOW (ref >=10)

## 2020-03-17 LAB — HIV ANTIBODY (ROUTINE TESTING W REFLEX): HIV 1&2 Ab, 4th Generation: NONREACTIVE

## 2020-03-17 LAB — RPR: RPR Ser Ql: NONREACTIVE

## 2020-03-18 ENCOUNTER — Telehealth: Payer: Self-pay

## 2020-03-18 ENCOUNTER — Other Ambulatory Visit: Payer: Self-pay | Admitting: Adult Health

## 2020-03-18 DIAGNOSIS — S39012A Strain of muscle, fascia and tendon of lower back, initial encounter: Secondary | ICD-10-CM

## 2020-03-18 MED ORDER — PREDNISONE 20 MG PO TABS: ORAL_TABLET | ORAL | 0 refills | Status: DC

## 2020-03-18 MED FILL — predniSONE 20 MG TABS: 20 | 7 days supply | Qty: 13 | Fill #0

## 2020-03-18 NOTE — Telephone Encounter (Signed)
Refill request for Prednisone, having back pain due to the arthritis in his spine. Please advise.

## 2020-03-29 ENCOUNTER — Encounter: Payer: Self-pay | Admitting: Neurology

## 2020-03-29 ENCOUNTER — Other Ambulatory Visit: Payer: Self-pay

## 2020-03-29 ENCOUNTER — Ambulatory Visit: Payer: Medicare Other | Admitting: Neurology

## 2020-03-29 VITALS — BP 136/76 | HR 71 | Ht 67.0 in | Wt 269.0 lb

## 2020-03-29 DIAGNOSIS — G20 Parkinson's disease: Secondary | ICD-10-CM | POA: Diagnosis not present

## 2020-03-29 MED FILL — metFORMIN HCL ER 500 MG TB2: 500 | 90 days supply | Qty: 360 | Fill #3

## 2020-03-29 NOTE — Progress Notes (Signed)
Reason for visit: Parkinson's disease  Todd Rice is an 63 y.o. male  History of present illness:  Todd Rice is a 64 year old right-handed white male with history of Parkinson's disease.  Over the last 6 months he has developed a more prominent resting tremor involving the right upper extremity.  Overall, he is doing well with his ability to perform physical activities.  He tries to stay active, he has a significant issue with obesity and diabetes.  He is on CPAP at night.  He usually sleeps fairly well, occasionally he will have vivid dreams and may act out dreams at night.  He has reported in the past that he has had some troubles with hypophonia, but this has not been an issue since the Sinemet was increased on the last visit.  He reports no stumbles or falls.  He returns the office today for further evaluation.  He denies any issues with swallowing.  He does get a 5-month checkup with speech, physical, and occupational therapy, his next evaluation is in February 2021.  Past Medical History:  Diagnosis Date  . Adult BMI 50.0-59.9 kg/sq m    52.77  . Anal fissure   . Anxiety   . Diabetes mellitus without complication (HCC)    borderline  . GERD (gastroesophageal reflux disease)    30 years ago, maybe from peptic ulcer  . H/O: gout    STABLE  . Hepatic steatosis 10/01/11  . History of kidney stones   . Hyperlipidemia   . Hypertension   . Kidney tumor    right upper kidney tumor, monitoring  . OSA on CPAP    [facility]  . Parkinson disease ([place]) 01/04/2016  . REM sleep behavior disorder 12/06/2016  . Right ureteral stone   . Ureteral calculus, right 06/12/2011  . Vitamin D deficiency   . Weakness     Past Surgical History:  Procedure Laterality Date  . ANAL FISSURE REPAIR  10-12-2000  . COLONOSCOPY N/A 07/01/2012   Procedure: COLONOSCOPY;  Surgeon: Lafayette Dragon, MD;  Location: [facility];  Service: Endoscopy;  Laterality: N/A;  . CYSTOSCOPY WITH RETROGRADE  PYELOGRAM, URETEROSCOPY AND STENT PLACEMENT Bilateral 02/26/2013   Procedure: CYSTOSCOPY WITH RETROGRADE PYELOGRAM, URETEROSCOPY AND STENT PLACEMENT;  Surgeon: Alexis Frock, MD;  Location: [facility];  Service: Urology;  Laterality: Bilateral;  . HOLMIUM LASER APPLICATION Bilateral 21/19/4174   Procedure: HOLMIUM LASER APPLICATION;  Surgeon: Alexis Frock, MD;  Location: [facility];  Service: Urology;  Laterality: Bilateral;  . kidney stone removal     06/2011-stent placement  . LEFT MEDIAL FEMORAL CONDYLE DEBRIDEMENT & DRILLING/ REMOVAL LOOSE BODY  09-15-2003  . NASAL SEPTUM SURGERY  1985  . STONE EXTRACTION WITH BASKET  06/12/2011   Procedure: STONE EXTRACTION WITH BASKET;  Surgeon: Claybon Jabs, MD;  Location: [facility];  Service: Urology;  Laterality: Right;    Family History  Problem Relation Age of Onset  . Heart disease Mother        Atrial fibrillation  . Hypertension Mother   . Hyperlipidemia Mother   . Lung cancer Father        was a smoker  . Cancer Father        lung  . Hyperlipidemia Brother   . Heart disease Maternal Aunt   . Hyperlipidemia Maternal Aunt   . Hypertension Maternal Aunt   . Stroke Maternal Aunt   . Heart disease Paternal Grandmother   . Hyperlipidemia Paternal Grandmother  Social history:  reports that he has quit smoking. He has never used smokeless tobacco. He reports that he does not drink alcohol and does not use drugs.    Allergies  Allergen Reactions  . Ciprofloxacin Hives  . Ceftriaxone Hives    Medications:  Prior to Admission medications   Medication Sig Start Date End Date Taking? Authorizing Provider  albuterol (VENTOLIN HFA) 108 (90 Base) MCG/ACT inhaler Use      2 inhalations      15 minutes  apart      every 4 hours      to rescue Asthma 03/01/20  Yes Unk Pinto, MD  allopurinol (ZYLOPRIM) 300 MG tablet Take       1 tablet       Daily       to Prevent Gout 02/09/20  Yes Unk Pinto, MD  ALPRAZolam Duanne Moron)  0.5 MG tablet Take 1 tablet (0.5 mg total) by mouth 3 (three) times daily as needed. 12/29/19  Yes [name], NP  aspirin EC 81 MG tablet Take 81 mg by mouth at bedtime.   Yes [provider]  carbidopa-levodopa (SINEMET) 25-250 MG tablet Take 1 tablet by mouth 3 (three) times daily. 09/15/19  Yes Kathrynn Ducking, MD  ezetimibe (ZETIA) 10 MG tablet Take 1 tablet (10 mg total) by mouth daily. 11/14/19 11/13/20 Yes Vicie Mutters R, PA-C  JANUVIA 100 MG tablet Take 100 mg by mouth daily. 07/22/19  Yes [provider]  metFORMIN (GLUCOPHAGE-XR) 500 MG 24 hr tablet TAKE 2 TABLETS 2 TIMES A DAY FOR DIABETES 06/27/19  Yes Unk Pinto, MD  mometasone (NASONEX) 50 MCG/ACT nasal spray Place 2 sprays into the nose daily. 07/30/19  Yes Dohmeier, Asencion Partridge, MD  phenazopyridine (PYRIDIUM) 200 MG tablet Take 1 tablet (200 mg total) by mouth 3 (three) times daily as needed for pain. 03/02/20 03/02/21 Yes [name], NP  pramipexole (MIRAPEX) 1 MG tablet Take 1 tablet (1 mg total) by mouth 3 (three) times daily. 03/18/19  Yes Kathrynn Ducking, MD  predniSONE (DELTASONE) 20 MG tablet 1 tab 3 x day for 2 days, then 2 x day for 2 days, then 1 x day for 3 days 03/18/20  Yes [name], NP  rosuvastatin (CRESTOR) 40 MG tablet Take 1 tablet (40 mg total) by mouth daily. 11/12/19  Yes Vladimir Crofts, PA-C  valsartan-hydrochlorothiazide (DIOVAN-HCT) 320-25 MG tablet Take    1 tablet    Daily     for BP 03/01/20  Yes Unk Pinto, MD  vitamin C (ASCORBIC ACID) 500 MG tablet Take 1 tablet (500 mg total) by mouth 3 (three) times daily. 01/16/17  Yes [name], NP  VITAMIN D PO Take 5,000 Units by mouth 2 (two) times daily.   Yes [provider]  zolpidem (AMBIEN) 5 MG tablet Take 1 tablet (5 mg total) by mouth at bedtime as needed for sleep. 07/30/19  Yes Dohmeier, Asencion Partridge, MD  glipiZIDE (GLUCOTROL) 5 MG tablet Take 1 tablet (5 mg total) by mouth 2 (two) times daily before a meal.  10/07/18 10/07/19  Vladimir Crofts, PA-C    ROS:  Out of a complete 14 system review of symptoms, the patient complains only of the following symptoms, and all other reviewed systems are negative.  Tremor Vivid dreams  Blood pressure 136/76, pulse 71, height 5\' 7"  (1.702 m), weight 269 lb (122 kg).  Physical Exam  General: The patient is alert and cooperative at the time of the  examination.  The patient is markedly obese.  Skin: No significant peripheral edema is noted.   Neurologic Exam  Mental status: The patient is alert and oriented x 3 at the time of the examination. The patient has apparent normal recent and remote memory, with an apparently normal attention span and concentration ability.   Cranial nerves: Facial symmetry is present. Speech is normal, no aphasia or dysarthria is noted. Extraocular movements are full. Visual fields are full.  Masking of the face is seen.  Motor: The patient has good strength in all 4 extremities.  Sensory examination: Soft touch sensation is symmetric on the face, arms, and legs.  Coordination: The patient has good finger-nose-finger and heel-to-shin bilaterally.  A resting tremor is prominent at times on the right arm.  Gait and station: The patient is able to arise from a seated position with arms crossed.  Once up, he is able to ambulate fairly well without assistance, he has good stride and good turns.  Decreased arm swing is seen bilaterally, right greater than left.  Tandem gait is normal. Romberg is negative. No drift is seen.  Reflexes: Deep tendon reflexes are symmetric.   Assessment/Plan:  1.  Parkinson's disease  2.  Sleep apnea on CPAP  3.  Obesity  4.  Diabetes  The patient claims that he is trying to stay active which is good.  We will continue the current dose of the Mirapex and Sinemet.  He will follow-up here in 5 months.  He is getting regular checkups through rehab.  Jill Alexanders MD 03/29/2020 7:23  AM  [facility] [address] [place], [place] [address]  Phone [phone] Fax [phone]

## 2020-04-22 DIAGNOSIS — G4733 Obstructive sleep apnea (adult) (pediatric): Secondary | ICD-10-CM | POA: Diagnosis not present

## 2020-05-03 ENCOUNTER — Other Ambulatory Visit: Payer: Self-pay | Admitting: Internal Medicine

## 2020-05-04 MED FILL — ALLOPURINOL 300 MG TABS: 300 | 90 days supply | Qty: 90 | Fill #0

## 2020-05-18 ENCOUNTER — Other Ambulatory Visit: Payer: Self-pay | Admitting: Adult Health

## 2020-05-18 MED ORDER — JANUVIA 100 MG PO TABS: 100.0000 mg | ORAL_TABLET | Freq: Every day | ORAL | 1 refills | Status: DC

## 2020-05-18 MED FILL — JANUVIA 100 MG TABLET: 100 | 90 days supply | Qty: 90 | Fill #0

## 2020-05-25 ENCOUNTER — Other Ambulatory Visit: Payer: Self-pay | Admitting: Internal Medicine

## 2020-05-25 DIAGNOSIS — I10 Essential (primary) hypertension: Secondary | ICD-10-CM

## 2020-05-25 MED ORDER — VALSARTAN-HYDROCHLOROTHIAZIDE 320-25 MG PO TABS: ORAL_TABLET | ORAL | 0 refills | Status: DC

## 2020-05-25 MED FILL — CARBIDOPA-LEVODOPA 25-250 T: 25-250 | 90 days supply | Qty: 270 | Fill #3

## 2020-05-25 MED FILL — VALSARTAN-HCTZ 320-25 MG TA: 320-25 | 90 days supply | Qty: 90 | Fill #0

## 2020-05-27 ENCOUNTER — Other Ambulatory Visit: Payer: Self-pay

## 2020-05-27 ENCOUNTER — Encounter: Payer: Self-pay | Admitting: Adult Health

## 2020-05-27 ENCOUNTER — Ambulatory Visit (INDEPENDENT_AMBULATORY_CARE_PROVIDER_SITE_OTHER): Payer: Medicare Other | Admitting: Adult Health

## 2020-05-27 VITALS — BP 126/80 | HR 70 | Temp 97.5°F | Wt 269.0 lb

## 2020-05-27 DIAGNOSIS — I2510 Atherosclerotic heart disease of native coronary artery without angina pectoris: Secondary | ICD-10-CM

## 2020-05-27 DIAGNOSIS — Z2239 Carrier of other specified bacterial diseases: Secondary | ICD-10-CM

## 2020-05-27 DIAGNOSIS — I700 Atherosclerosis of aorta: Secondary | ICD-10-CM | POA: Diagnosis not present

## 2020-05-27 DIAGNOSIS — G4733 Obstructive sleep apnea (adult) (pediatric): Secondary | ICD-10-CM

## 2020-05-27 DIAGNOSIS — G4752 REM sleep behavior disorder: Secondary | ICD-10-CM

## 2020-05-27 DIAGNOSIS — J3089 Other allergic rhinitis: Secondary | ICD-10-CM

## 2020-05-27 DIAGNOSIS — R6889 Other general symptoms and signs: Secondary | ICD-10-CM

## 2020-05-27 DIAGNOSIS — J302 Other seasonal allergic rhinitis: Secondary | ICD-10-CM

## 2020-05-27 DIAGNOSIS — E11620 Type 2 diabetes mellitus with diabetic dermatitis: Secondary | ICD-10-CM

## 2020-05-27 DIAGNOSIS — C641 Malignant neoplasm of right kidney, except renal pelvis: Secondary | ICD-10-CM

## 2020-05-27 DIAGNOSIS — E1122 Type 2 diabetes mellitus with diabetic chronic kidney disease: Secondary | ICD-10-CM

## 2020-05-27 DIAGNOSIS — F325 Major depressive disorder, single episode, in full remission: Secondary | ICD-10-CM

## 2020-05-27 DIAGNOSIS — Z0001 Encounter for general adult medical examination with abnormal findings: Secondary | ICD-10-CM | POA: Diagnosis not present

## 2020-05-27 DIAGNOSIS — R972 Elevated prostate specific antigen [PSA]: Secondary | ICD-10-CM

## 2020-05-27 DIAGNOSIS — Z0000 Encounter for general adult medical examination without abnormal findings: Secondary | ICD-10-CM

## 2020-05-27 DIAGNOSIS — I10 Essential (primary) hypertension: Secondary | ICD-10-CM

## 2020-05-27 DIAGNOSIS — E559 Vitamin D deficiency, unspecified: Secondary | ICD-10-CM

## 2020-05-27 DIAGNOSIS — Z79899 Other long term (current) drug therapy: Secondary | ICD-10-CM

## 2020-05-27 DIAGNOSIS — E1169 Type 2 diabetes mellitus with other specified complication: Secondary | ICD-10-CM | POA: Diagnosis not present

## 2020-05-27 DIAGNOSIS — E785 Hyperlipidemia, unspecified: Secondary | ICD-10-CM | POA: Diagnosis not present

## 2020-05-27 DIAGNOSIS — G20 Parkinson's disease: Secondary | ICD-10-CM

## 2020-05-27 DIAGNOSIS — Z6841 Body Mass Index (BMI) 40.0 and over, adult: Secondary | ICD-10-CM

## 2020-05-27 DIAGNOSIS — J4520 Mild intermittent asthma, uncomplicated: Secondary | ICD-10-CM

## 2020-05-27 DIAGNOSIS — K760 Fatty (change of) liver, not elsewhere classified: Secondary | ICD-10-CM

## 2020-05-27 DIAGNOSIS — N183 Chronic kidney disease, stage 3 unspecified: Secondary | ICD-10-CM

## 2020-05-27 DIAGNOSIS — R063 Periodic breathing: Secondary | ICD-10-CM

## 2020-05-27 NOTE — Progress Notes (Signed)
ANNUAL MEDICARE VISIT  Assessment and Plan:  ANNUAL MEDICARE VISIT 1 year Eye exam report requessted  Coronary artery disease involving native coronary artery of native heart without angina pectoris Control blood pressure, cholesterol, glucose, increase exercise.  Go to the ER if any chest pain, shortness of breath, nausea, dizziness, severe HA, changes vision/speech  Essential hypertension - continue medications, DASH diet, exercise and monitor at home. Call if greater than 130/80.  -     CBC with Differential/Platelet -     CMP/GFR -     TSH  Controlled type 2 diabetes mellitus with diabetic dermatitis, without long-term current use of insulin ([place]) Discussed general issues about diabetes pathophysiology and management., Educational material distributed., Suggested low cholesterol diet., Encouraged aerobic exercise., Discussed foot care., Reminded to get yearly retinal exam.      -      HgbA1C  Hyperlipidemia, unspecified hyperlipidemia type -taking rosuvastatin as tolerated - some GI discomfort check lipids, decrease fatty foods, increase activity.  -     Lipid panel  Class 3 obesity due to excess calories with serious comorbidity and body mass index (BMI) of 45.0 to 49.9 in adult [facility])  - long discussion about weight loss, diet, and exercise -     Lipid panel -     Hemoglobin A1c  Parkinson disease (HCC) Continue neuro Todd Rice follow up and meds PT routinely; doing exercises at home; Todd Rice orders as needed  Medication management -     Magnesium  Vitamin D deficiency -     VITAMIN D 25 Hydroxy (Vit-D Deficiency, Fractures)  Seasonal and perennial allergic rhinitis - Allegra OTC, increase H20, allergy hygiene explained.  Asthma, mild intermittent, well-controlled Weight loss, PRN albuterol, avoid triggers  Right renal cell carcinoma (HCC) Todd Rice following, active monitoring  Pseudomonas aeruginosa colonization Monitor, urology following Recently est  with ID Dr. Tommy Medal for IV treatment if symptomatic   OSA treated with BiPAP Dr. Brett Fairy following- continue BiPAP, helping with daytime fatigue, weight loss still advised.   REM sleep behavior disorder continue neuro follow up  Major depression in remission ([place]) In remission off of medication; monitor    Orders Placed This Encounter  Procedures  . CBC with Differential/Platelet  . COMPLETE METABOLIC PANEL WITH GFR  . Magnesium  . Lipid panel  . TSH  . Hemoglobin A1c    Discussed med's effects and SE's. Screening labs and tests as requested with regular follow-up as recommended.  Future Appointments  Date Time Provider [place]  06/29/2020  1:15 PM Arliss Journey, [place] [facility]  06/29/2020  1:30 PM Delton Prairie, OT [facility]  06/29/2020  1:45 PM Claudette Laws [facility]  08/31/2020  7:30 AM Kathrynn Ducking, MD [facility] None  09/27/2020  9:30 AM Unk Pinto, MD [facility] None  02/17/2021  2:00 PM [name], NP [facility] None  05/30/2021 11:00 AM [name], NP [facility] None     Plan:   During the course of the visit the patient was educated and counseled about appropriate screening and preventive services including:    Pneumococcal vaccine   Prevnar 13  Influenza vaccine  Td vaccine  Screening electrocardiogram  Bone densitometry screening  Colorectal cancer screening  Diabetes screening  Glaucoma screening  Nutrition counseling   Advanced directives: requested    HPI Todd Rice patient presents for AWV and 3 month follow up.  has CAD (coronary artery disease); Hypertension; Morbid obesity with BMI of  40.0-44.9, adult (Todd Rice); Pseudomonas aeruginosa colonization; Seasonal and perennial allergic rhinitis; Asthma, mild intermittent, well-controlled; Parkinson's disease ([place]); CKD stage 3 due to type 2 diabetes mellitus (Todd Rice); Medication management; Vitamin D deficiency; REM sleep behavior  disorder; OSA treated with BiPAP; Controlled type 2 diabetes mellitus with diabetic dermatitis, without long-term current use of insulin ([place]); Aortic atherosclerosis (Todd Rice); Hyperlipidemia associated with type 2 diabetes mellitus ([place] Todd Rice); Elevated PSA; Central sleep apnea due to Cheyne-Stokes respiration; Major depression in remission (Todd Rice); Primary renal papillary carcinoma, right (Todd Rice); and Hepatic steatosis on their problem list.  He has depression in remission, formerly on wellbutrin but reports doing fairly off of this med. He has xanax that he uses occasionally, typically 2-3 tabs per month. Rare use of ambien for sleep.   He is on BiPAP for OSA per Dr. Brett Fairy, endorses 100% compliance and restorative sleep.   He had MRI with MRCP with Todd Rice for pancreatic cyst in May 2020, and will discuss having repeat in 2 years, due 09/2020.  Also has R renal papillary carcinoma, Follows annually with Todd Rice, has been stable for several years.   Follows with neurology Todd Rice for Parkinson'. He reports that he has been taking the mirapex and sinemet and feels well controlled. Does continue to have significant L hand tremor. Did PT summer 2021, discussing with PT therapist restarting again soon as has been helpful.    He is on miralax once a day for constipation, improved with addition of senokot every other day.   Has mild asthma, has albuterol uses q2-3 days PRN if needed when outdoors and ragweed season.    He has hx of chronic pseudomonas bladder colonization, recently est with Dr. Tommy Medal so if recurrent UTI sx planning on IV abx.   BMI is Body mass index is 42.13 kg/m., he has been working on diet and exercise. Wt Readings from Last 3 Encounters:  05/27/20 269 lb (122 kg)  03/29/20 269 lb (122 kg)  03/16/20 271 lb (122.9 kg)   Aortic atherosclerosis per CXR 2020. Normal stress test 2013 by Dr. Percival Spanish due to CAD per CT.  His blood pressure has been controlled at home, today their  BP is BP: 126/80 He does workout. He denies chest pain, shortness of breath, dizziness.  He is on cholesterol medication (rosuvastatin 40 mg taking every 2-3 days due to GI discomfort) and denies myalgias. His cholesterol is not at goal of 70 or less. He reports has cut way back on french fries and fried foods. The cholesterol last visit was:   Lab Results  Component Value Date   CHOL 180 02/18/2020   HDL 30 (L) 02/18/2020   [place]  02/18/2020     Comment:     . LDL cholesterol not calculated. Triglyceride levels greater than 400 mg/dL invalidate calculated LDL results. . Reference range: <100 . Desirable range <100 mg/dL for primary prevention;   <70 mg/dL for patients with CHD or diabetic patients  with > or = 2 CHD risk factors. Marland Kitchen LDL-C is now calculated using the Martin-Hopkins  calculation, which is a validated novel method providing  better accuracy than the Friedewald equation in the  estimation of LDL-C.  Cresenciano Genre et al. Annamaria Helling. [id number]): 2061-2068  (http://education.[employer].com/faq/FAQ164)    TRIG 417 (H) 02/18/2020   CHOLHDL 6.0 (H) 02/18/2020   He has been working on diet and exercise for diabetes CKD, he is on ARB Hyperlipemia on crestor Aortic atherosclerosis he is on bASA he is on metformin  4 a day  [place] at lunch - Only takes glipizide if his sugar is 150+ in AM takes 1/2, 200+ takes whole if 200+ His sugars have been running 91-163, average 135 Has hypoglycemic awareness Eye Exam 2021, Dr. Sherral Hammers  denies foot ulcerations, hyperglycemia, hypoglycemia , increased appetite, nausea, paresthesia of the feet, polydipsia, polyuria, visual disturbances, vomiting and weight loss.  Last A1C in the office was:  Lab Results  Component Value Date   HGBA1C 6.9 (H) 02/18/2020   He has CKD II associated with T2DM monitored at this office, was slightly lower last check:  Lab Results  Component Value Date   GFRNONAA 55 (L) 02/18/2020   Patient is on  Vitamin D supplement.   Lab Results  Component Value Date   VD25OH 54 02/18/2020   Hx of PSA elevation, follows with Todd Rice. Denies LUTS.  Lab Results  Component Value Date   PSA 2.08 02/18/2020     Current Medications:   Current Outpatient Medications (Endocrine & Metabolic):  Marland Kitchen  JANUVIA 100 MG tablet, Take 1 tablet (100 mg total) by mouth daily. .  metFORMIN (GLUCOPHAGE-XR) 500 MG 24 hr tablet, TAKE 2 TABLETS 2 TIMES A DAY FOR DIABETES .  glipiZIDE (GLUCOTROL) 5 MG tablet, Take 1 tablet (5 mg total) by mouth 2 (two) times daily before a meal.  Current Outpatient Medications (Cardiovascular):  .  ezetimibe (ZETIA) 10 MG tablet, Take 1 tablet (10 mg total) by mouth daily. .  rosuvastatin (CRESTOR) 40 MG tablet, Take 1 tablet (40 mg total) by mouth daily. .  valsartan-hydrochlorothiazide (DIOVAN-HCT) 320-25 MG tablet, Take   1 tablet   Daily    for BP  Current Outpatient Medications (Respiratory):  .  albuterol (VENTOLIN HFA) 108 (90 Base) MCG/ACT inhaler, Use      2 inhalations      15 minutes  apart      every 4 hours      to rescue Asthma .  mometasone (NASONEX) 50 MCG/ACT nasal spray, Place 2 sprays into the nose daily.  Current Outpatient Medications (Analgesics):  .  allopurinol (ZYLOPRIM) 300 MG tablet, Take     1 tablet     Daily       to Prevent Gout .  aspirin EC 81 MG tablet, Take 81 mg by mouth at bedtime.   Current Outpatient Medications (Other):  Marland Kitchen  ALPRAZolam (XANAX) 0.5 MG tablet, Take 1 tablet (0.5 mg total) by mouth 3 (three) times daily as needed. .  carbidopa-levodopa (SINEMET) 25-250 MG tablet, Take 1 tablet by mouth 3 (three) times daily. .  pramipexole (MIRAPEX) 1 MG tablet, Take 1 tablet (1 mg total) by mouth 3 (three) times daily. .  vitamin C (ASCORBIC ACID) 500 MG tablet, Take 1 tablet (500 mg total) by mouth 3 (three) times daily. Marland Kitchen  VITAMIN D PO, Take 5,000 Units by mouth 2 (two) times daily. Marland Kitchen  zolpidem (AMBIEN) 5 MG tablet, Take 1 tablet (5 mg  total) by mouth at bedtime as needed for sleep.  Health Maintenance:  Immunization History  Administered Date(s) Administered  . Influenza Inj Mdck Quad With Preservative 02/22/2017, 01/21/2018, 02/06/2019  . Influenza, High Dose Seasonal PF 02/18/2020  . Influenza-Unspecified 02/03/2013, 02/05/2014  . PFIZER(Purple Top)SARS-COV-2 Vaccination 06/14/2019, 07/09/2019, 03/06/2020  . Pneumococcal Conjugate-13 02/18/2020  . Pneumococcal-Unspecified 11/05/2012  . Tdap 03/26/2015     Tetanus: 2016 Pneumovax: 2014, 1 year Prevnar 13: 2021 Flu vaccine: 2021 Covid 19: 3/3, 2021, pfizer   Colonoscopy: 2014  Sleep study 2017 CTA chest 2014 CXR 06/2018 MRI brain 2017 Stress test 2013 ABD MRI/MRCP 09/2018 - pancreatic cyst, interval enlargement, Todd Rice recommended 2 year follow up MRI through his office, due 09/2020   IMPRESSION: 1.4 cm simple appearing cystic lesion in pancreatic body, mildly increased in since prior studies dating back to 2015. Smaller cystic lesions in the pancreatic body and tail appear to have been present on prior MRI although less well visualized due to their small size. 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 Consider continued follow-up by MRI in 1 year or endoscopic ultrasound with FNA. This recommendation follows ACR consensus guidelines: Management of Incidental Pancreatic Cysts: A White Paper of the ACR Incidental Findings Committee. [place] 4239;53:202-334.  Stable 2.0 cm right renal mass, consistent with papillary renal cell Carcinoma.  - Todd Rice following annually   No evidence of abdominal metastatic disease. Moderate hepatic steatosis  Last eye exam: 2021, Dr. Sherral Hammers, will request report Last dental exam: Dr. Geroge Baseman, had 02/2020 Last derm exam: remote Dr. Ronnald Ramp   [name], [name] Keene Breath., MD  (Ophthalmology) Alexis Frock, [name], MD (Inactive) as Consulting Physician (Gastroenterology)   Medical History:  Past Medical History:  Diagnosis Date  . Adult BMI 50.0-Todd.9 kg/sq m    52.77  . Anal fissure   . Anxiety   . Diabetes mellitus without complication (HCC)    borderline  . GERD (gastroesophageal reflux disease)    30 years ago, maybe from peptic ulcer  . H/O: gout    STABLE  . Hepatic steatosis 10/01/11  . History of kidney stones   . Hyperlipidemia   . Hypertension   . Kidney tumor    right upper kidney tumor, monitoring  . OSA on CPAP    [facility]  . Parkinson disease ([place]) 01/04/2016  . REM sleep behavior disorder 12/06/2016  . Right ureteral stone   . Ureteral calculus, right 06/12/2011  . Vitamin D deficiency   . Weakness    Allergies Allergies  Allergen Reactions  . Ciprofloxacin Hives  . Ceftriaxone Hives    SURGICAL HISTORY He  has a past surgical history that includes Anal fissure repair (10-12-2000); Nasal septum surgery (1985); LEFT MEDIAL FEMORAL CONDYLE DEBRIDEMENT & DRILLING/ REMOVAL LOOSE BODY (09-15-2003); Stone extraction with basket (06/12/2011); kidney stone removal; Colonoscopy (N/A, 07/01/2012); Cystoscopy with retrograde pyelogram, ureteroscopy and stent placement (Bilateral, 02/26/2013); and Holmium laser application (Bilateral, 02/26/2013). FAMILY HISTORY His family history includes Cancer in his father; Heart disease in his maternal aunt, mother, and paternal grandmother; Hyperlipidemia in his brother, maternal aunt, mother, and paternal grandmother; Hypertension in his maternal aunt and mother; Lung cancer in his father; Stroke in his maternal aunt. SOCIAL HISTORY He  reports that he has quit smoking. He has never used smokeless tobacco. He reports that he does not drink alcohol and does not use drugs.  MEDICARE WELLNESS OBJECTIVES: Physical activity: Current Exercise Habits: Home exercise  routine, Type of exercise: walking;stretching;strength training/weights, Time (Minutes): 50, Frequency (Times/Week): 4, Weekly Exercise (Minutes/Week): 200, Intensity: Mild, Exercise limited by: neurologic condition(s) Cardiac risk factors: Cardiac Risk Factors include: advanced age (>65men, >71 women);Rice gender;hypertension;dyslipidemia;obesity (BMI >30kg/m2);diabetes mellitus Depression/mood screen:   Depression screen [facility] 2/9 05/27/2020  Decreased Interest 0  Down, Depressed, Hopeless 0  PHQ - 2 Score 0  Altered sleeping 1  Tired, decreased energy 0  Change in appetite 0  Feeling bad or failure about yourself  0  Trouble concentrating 0  Moving slowly or fidgety/restless 0  Suicidal thoughts 0  PHQ-9 Score 1  Difficult doing work/chores Not difficult at all  Some recent data might be hidden    ADLs:  In your present state of health, do you have any difficulty performing the following activities: 05/27/2020 07/10/2019  Hearing? N N  Vision? N N  Difficulty concentrating or making decisions? N Y  Walking or climbing stairs? N N  Comment - used hand rail  Dressing or bathing? N Y  Comment - can not do buttons anymore with parkinson's but otherwise normal  Doing errands, shopping? N N  Some recent data might be hidden     Cognitive Testing  Alert? Yes  Normal Appearance?Yes  Oriented to person? Yes  Place? Yes   Time? Yes  Recall of three objects?  Yes  Can perform simple calculations? Yes  Displays appropriate judgment?Yes  Can read the correct time from a watch face?Yes  EOL planning: Does Patient Have a Medical Advance Directive?: No Does patient want to make changes to medical advance directive?: No - Patient declined    Review of Systems:  Review of Systems  Constitutional: Negative for malaise/fatigue and weight loss.  HENT: Negative for hearing loss and tinnitus.   Eyes: Negative for blurred vision and double vision.  Respiratory: Negative for cough, shortness of  breath and wheezing.   Cardiovascular: Negative for chest pain, palpitations, orthopnea, claudication and leg swelling.  Gastrointestinal: Negative for abdominal pain, blood in stool, constipation, diarrhea, heartburn, melena, nausea and vomiting.  Genitourinary: Negative.   Musculoskeletal: Negative for joint pain and myalgias.  Skin: Negative for rash.  Neurological: Positive for tremors (L hand). Negative for dizziness, tingling, sensory change, weakness and headaches.  Endo/Heme/Allergies: Negative for polydipsia.  Psychiatric/Behavioral: Negative for depression and memory loss. The patient has insomnia (occasionally ). The patient is not nervous/anxious ([place]).   All other systems reviewed and are negative.   Physical Exam: Estimated body mass index is 42.13 kg/m as calculated from the following:   Height as of 03/29/20: 5\' 7"  (1.702 m).   Weight as of this encounter: 269 lb (122 kg). BP 126/80   Pulse 70   Temp (!) 97.5 F (36.4 C)   Wt 269 lb (122 kg)   SpO2 97%   BMI 42.13 kg/m   General Appearance: Well nourished, well dressed morbidly obese Rice in no apparent distress.  Eyes: PERRLA, EOMs, conjunctiva no swelling or erythema ENT/Mouth: Ear canals clear bilaterally with no erythema, swelling, discharge.  TMs normal bilaterally with no erythema, bulging, or retractions.  Oropharynx clear and moist with no exudate, swelling, or erythema.  Dentition normal.   Neck: Supple, thyroid normal. No bruits, JVD, cervical adenopathy Respiratory: Respiratory effort normal, BS equal bilaterally without rales, rhonchi, wheezing or stridor.  Cardio: RRR without murmurs, rubs or gallops. Brisk peripheral pulses without edema.  Chest: symmetric, with normal excursions Abdomen: Soft, morbidly obese limiting exam, nontender, no guarding, rebound, hernias, masses, or organomegaly. Genitourinary: deferred today  Musculoskeletal: Full ROM all peripheral extremities,5/5 strength, and normal  gait.  Skin: Warm, dry without rashes, lesions, ecchymosis. Neuro: A&Ox3, Cranial nerves intact, slow gait.  Overshoot of the right hand with finger to nose.  Tremor of the left hand with finger opposition>tremor of the right hand with finger opposition.    Psych: Flat affect, Insight and Judgment appropriate.    Medicare  Attestation I have personally reviewed: The patient's medical and social history Their use of alcohol, tobacco or illicit drugs Their current medications and supplements The patient's functional ability including ADLs,fall risks, home safety risks, cognitive, and hearing and visual impairment Diet and physical activities Evidence for depression or mood disorders  The patient's weight, height, BMI, and visual acuity have been recorded in the chart.  I have made referrals, counseling, and provided education to the patient based on review of the above and I have provided the patient with a written personalized care plan for preventive services.    Over 40 minutes of exam, counseling, chart review and critical decision making was performed  Izora Ribas 1:23 PM [facility]

## 2020-05-28 ENCOUNTER — Telehealth: Payer: Self-pay | Admitting: Physical Therapy

## 2020-05-28 DIAGNOSIS — G20 Parkinson's disease: Secondary | ICD-10-CM

## 2020-05-28 LAB — CBC WITH DIFFERENTIAL/PLATELET
Absolute Monocytes: 640 cells/uL (ref 200–950)
Basophils Absolute: 31 cells/uL (ref 0–200)
Basophils Relative: 0.4 %
Eosinophils Absolute: 109 cells/uL (ref 15–500)
Eosinophils Relative: 1.4 %
HCT: 44.8 % (ref 38.5–50.0)
Hemoglobin: 15.4 g/dL (ref 13.2–17.1)
Lymphs Abs: 1958 cells/uL (ref 850–3900)
MCH: 28.6 pg (ref 27.0–33.0)
MCHC: 34.4 g/dL (ref 32.0–36.0)
MCV: 83.3 fL (ref 80.0–100.0)
MPV: 11.2 fL (ref 7.5–12.5)
Monocytes Relative: 8.2 %
Neutro Abs: 5062 cells/uL (ref 1500–7800)
Neutrophils Relative %: 64.9 %
Platelets: 201 10*3/uL (ref 140–400)
RBC: 5.38 10*6/uL (ref 4.20–5.80)
RDW: 14.3 % (ref 11.0–15.0)
Total Lymphocyte: 25.1 %
WBC: 7.8 10*3/uL (ref 3.8–10.8)

## 2020-05-28 LAB — COMPLETE METABOLIC PANEL WITH GFR
AG Ratio: 1.4 (calc) (ref 1.0–2.5)
ALT: 8 U/L — ABNORMAL LOW (ref 9–46)
AST: 13 U/L (ref 10–35)
Albumin: 4.1 g/dL (ref 3.6–5.1)
Alkaline phosphatase (APISO): 45 U/L (ref 35–144)
BUN/Creatinine Ratio: 14 (calc) (ref 6–22)
BUN: 18 mg/dL (ref 7–25)
CO2: 27 mmol/L (ref 20–32)
Calcium: 10.4 mg/dL — ABNORMAL HIGH (ref 8.6–10.3)
Chloride: 101 mmol/L (ref 98–110)
Creat: 1.28 mg/dL — ABNORMAL HIGH (ref 0.70–1.25)
GFR, Est African American: 68 mL/min/{1.73_m2} (ref >=60)
GFR, Est Non African American: 58 mL/min/{1.73_m2} — ABNORMAL LOW (ref >=60)
Globulin: 3 g/dL (calc) (ref 1.9–3.7)
Glucose, Bld: 112 mg/dL — ABNORMAL HIGH (ref 65–99)
Potassium: 3.9 mmol/L (ref 3.5–5.3)
Sodium: 138 mmol/L (ref 135–146)
Total Bilirubin: 0.4 mg/dL (ref 0.2–1.2)
Total Protein: 7.1 g/dL (ref 6.1–8.1)

## 2020-05-28 LAB — LIPID PANEL
Cholesterol: 175 mg/dL (ref <200)
HDL: 31 mg/dL — ABNORMAL LOW (ref >=40)
LDL Cholesterol (Calc): 103 mg/dL (calc) — ABNORMAL HIGH
Non-HDL Cholesterol (Calc): 144 mg/dL (calc) — ABNORMAL HIGH (ref <130)
Total CHOL/HDL Ratio: 5.6 (calc) — ABNORMAL HIGH (ref <5.0)
Triglycerides: 306 mg/dL — ABNORMAL HIGH (ref <150)

## 2020-05-28 LAB — HEMOGLOBIN A1C
Hgb A1c MFr Bld: 6.8 % of total Hgb — ABNORMAL HIGH (ref <5.7)
Mean Plasma Glucose: 148 mg/dL
eAG (mmol/L): 8.2 mmol/L

## 2020-05-28 LAB — MAGNESIUM: Magnesium: 1.8 mg/dL (ref 1.5–2.5)

## 2020-05-28 LAB — TSH: TSH: 2.45 mIU/L (ref 0.40–4.50)

## 2020-05-28 NOTE — Telephone Encounter (Signed)
Dr. [name], Todd Rice is currently scheduled for PT, OT, speech therapy screens in February 2022.  However, he has contacted this therapist and stated that he feels he needs OT and PT at this time and would prefer to come in for evaluations.  He is agreeable to me requesting OT and physical therapy orders to address functional deficits associated with his Parkinson's disease.  If you agree, could you please write orders via Epic?  Once received, we will schedule him for PT and OT evaluations.  Thank you.  Mady Haagensen, PT 05/28/20 1:50 PM Phone: [phone] Fax: [phone]

## 2020-05-29 NOTE — Telephone Encounter (Signed)
I will put the orders in for physical and Occupational Therapy.

## 2020-06-14 ENCOUNTER — Ambulatory Visit: Payer: Medicare Other | Admitting: Physical Therapy

## 2020-06-14 ENCOUNTER — Encounter: Payer: Self-pay | Admitting: Occupational Therapy

## 2020-06-14 ENCOUNTER — Encounter: Payer: Self-pay | Admitting: Physical Therapy

## 2020-06-14 ENCOUNTER — Encounter: Payer: Self-pay | Admitting: Internal Medicine

## 2020-06-14 ENCOUNTER — Ambulatory Visit: Payer: Medicare Other | Attending: Neurology | Admitting: Occupational Therapy

## 2020-06-14 ENCOUNTER — Other Ambulatory Visit: Payer: Self-pay

## 2020-06-14 DIAGNOSIS — R2689 Other abnormalities of gait and mobility: Secondary | ICD-10-CM

## 2020-06-14 DIAGNOSIS — G8929 Other chronic pain: Secondary | ICD-10-CM | POA: Diagnosis not present

## 2020-06-14 DIAGNOSIS — R29818 Other symptoms and signs involving the nervous system: Secondary | ICD-10-CM | POA: Diagnosis not present

## 2020-06-14 DIAGNOSIS — R29898 Other symptoms and signs involving the musculoskeletal system: Secondary | ICD-10-CM | POA: Insufficient documentation

## 2020-06-14 DIAGNOSIS — R278 Other lack of coordination: Secondary | ICD-10-CM | POA: Diagnosis not present

## 2020-06-14 DIAGNOSIS — M6281 Muscle weakness (generalized): Secondary | ICD-10-CM | POA: Insufficient documentation

## 2020-06-14 DIAGNOSIS — M25512 Pain in left shoulder: Secondary | ICD-10-CM | POA: Insufficient documentation

## 2020-06-14 DIAGNOSIS — M25612 Stiffness of left shoulder, not elsewhere classified: Secondary | ICD-10-CM | POA: Diagnosis not present

## 2020-06-14 DIAGNOSIS — R251 Tremor, unspecified: Secondary | ICD-10-CM

## 2020-06-14 DIAGNOSIS — R293 Abnormal posture: Secondary | ICD-10-CM | POA: Diagnosis not present

## 2020-06-14 DIAGNOSIS — R2681 Unsteadiness on feet: Secondary | ICD-10-CM

## 2020-06-14 NOTE — Therapy (Signed)
[facility] [address] [place], [place], [address] Phone: [phone]   Fax:  [phone]  Occupational Therapy Evaluation  Patient Details  Name: Todd Rice MRN: [id number] Date of Birth: 04/04/1956 Referring Provider (OT): Dr. Margette Fast   Encounter Date: 06/14/2020   OT End of Session - 06/14/20 1234    Visit Number 1    Number of Visits 13    Date for OT Re-Evaluation 07/29/20    Authorization Type [employer] [place] 2021, no deductible, [place]: [place], no auth    Authorization - Visit Number 1    Authorization - Number of Visits 10    Progress Note Due on Visit 10    OT Start Time 1108    OT Stop Time 1152    OT Time Calculation (min) 44 min    Activity Tolerance Patient tolerated treatment well    Behavior During Therapy [facility] for tasks assessed/performed           Past Medical History:  Diagnosis Date  . Adult BMI 50.0-59.9 kg/sq m    52.77  . Anal fissure   . Anxiety   . Diabetes mellitus without complication (HCC)    borderline  . GERD (gastroesophageal reflux disease)    30 years ago, maybe from peptic ulcer  . H/O: gout    STABLE  . Hepatic steatosis 10/01/11  . History of kidney stones   . Hyperlipidemia   . Hypertension   . Kidney tumor    right upper kidney tumor, monitoring  . OSA on CPAP    [facility]  . Parkinson disease ([place]) 01/04/2016  . REM sleep behavior disorder 12/06/2016  . Right ureteral stone   . Ureteral calculus, right 06/12/2011  . Vitamin D deficiency   . Weakness     Past Surgical History:  Procedure Laterality Date  . ANAL FISSURE REPAIR  10-12-2000  . COLONOSCOPY N/A 07/01/2012   Procedure: COLONOSCOPY;  Surgeon: Lafayette Dragon, MD;  Location: [facility];  Service: Endoscopy;  Laterality: N/A;  . CYSTOSCOPY WITH RETROGRADE PYELOGRAM, URETEROSCOPY AND STENT PLACEMENT Bilateral 02/26/2013   Procedure: CYSTOSCOPY WITH RETROGRADE PYELOGRAM, URETEROSCOPY AND STENT PLACEMENT;   Surgeon: Alexis Frock, MD;  Location: [facility];  Service: Urology;  Laterality: Bilateral;  . HOLMIUM LASER APPLICATION Bilateral 11/16/1973   Procedure: HOLMIUM LASER APPLICATION;  Surgeon: Alexis Frock, MD;  Location: [facility];  Service: Urology;  Laterality: Bilateral;  . kidney stone removal     06/2011-stent placement  . LEFT MEDIAL FEMORAL CONDYLE DEBRIDEMENT & DRILLING/ REMOVAL LOOSE BODY  09-15-2003  . NASAL SEPTUM SURGERY  1985  . STONE EXTRACTION WITH BASKET  06/12/2011   Procedure: STONE EXTRACTION WITH BASKET;  Surgeon: Claybon Jabs, MD;  Location: [facility];  Service: Urology;  Laterality: Right;    There were no vitals filed for this visit.   Subjective Assessment - 06/14/20 1106    Subjective  need to work on my hands again.  My L shoulder is better after seeing you last time, but still hurts some.    Pertinent History Parkinson's Disease.  PMH:CAD, HTN, HDL, obesity, asthma, DM, stage 3 CKD, vitamin D deficiency, REM sleep disorder, OSA, aortic atherosclerosis, hx of gout, GERD, hx of anxiety    Patient Stated Goals improve use of hands    Currently in Pain? No/denies    Pain Score 3     Pain Location Shoulder    Pain Orientation Left  Pain Descriptors / Indicators Aching    Pain Type Chronic pain    Pain Onset More than a month ago    Pain Frequency Intermittent    Aggravating Factors  malpositioning, end range ER with abduction    Pain Relieving Factors rest             [facility] OT Assessment - 06/14/20 1111      Assessment   Medical Diagnosis Parkinson's disease    Referring Provider (OT) Dr. Margette Fast    Onset Date/Surgical Date 05/29/20   per pt request eval vs screen   Hand Dominance Right    Prior Therapy last seen by OT 12/2019      Precautions   Precautions Fall      Balance Screen   Has the patient fallen in the past 6 months No      Home  Environment   Family/patient expects to be discharged to: Private residence    Lives  With [place] Retired    Leisure Psychologist, counselling the dog; does household chores      ADL   Eating/Feeding --   min spills   Grooming Modified independent    Upper Body Bathing Modified independent    Lower Body Bathing Modified independent    Upper Body Dressing Increased time    Lower Body Dressing Increased time    Print production planner Modified independent    No Name    Tub/Shower Transfer Modified independent    Transfers/Ambulation Related to ADL's difficulty getting in/out of car (low); min difficulty geting out of bed      IADL   Prior Level of Function Light Housekeeping pt reports that he was able to shovel snow, Mod I for home maintenance, min difficulty locking/unlocking door    Community Mobility Drives own vehicle    Medication Management --   wife assists with managing   Prior Level of Function Financial Management wife has always performed      Mobility   Mobility Status Independent    Mobility Status Comments see PT eval for details, near fall      Written Expression   Dominant Hand Right    Handwriting --   95% legibile, mod micrographia     Vision - History   Baseline Vision Wears glasses all the time    Additional Comments Pt reports shadows/light in peripheral vision rarely, watery eyes      Cognition   Overall Cognitive Status Impaired/Different from baseline    Memory Impaired    Memory Impairment --   pt reports mild memory changes, some difficulty with dates   Bradyphrenia Yes      Observation/Other Assessments   Focus on Therapeutic Outcomes (FOTO)  n/a    Standing Functional Reach Test R-8", L-9'    Other Surveys  Select    Physical Performance Test   Yes    Simulated Eating Time (seconds) 19.34    Simulated Eating Comments --   gross grasp, holds at end   Donning Doffing Jacket Time (seconds) 15.12    pt's coat   Donning Doffing Jacket Comments Fastening/unfastening 3 buttons in 48.35sec.  Tying shoes (standing with foot on chair in 26.50sec)      Posture/Postural Control   Posture/Postural Control Postural limitations    Postural Limitations Rounded Shoulders;Forward head  Coordination   9 Hole Peg Test Right;Left    Right 9 Hole Peg Test 41.16    Left 9 Hole Peg Test 29.75    Box and Blocks R-38blocks, L-48blocks    Tremors R resting noted, pt reports incr with stress and can be with activity      Tone   Assessment Location Right Upper Extremity;Left Upper Extremity      ROM / Strength   AROM / PROM / Strength AROM      AROM   Overall AROM  Deficits    Overall AROM Comments R shoulder flex 150* with -10* elbow ext, L shoulder 140* shoulder flex with -20* elbow ext (pt reports L shoulder pain with high range ER with abduction)      RUE Tone   RUE Tone Moderate      LUE Tone   LUE Tone Mild                           OT Education - 06/14/20 1246    Education Details OT eval results/POC, reviewed proper positioning of LUE to decr risk of pain    Person(s) Educated Patient    Methods Explanation    Comprehension Verbalized understanding            OT Short Term Goals - 06/14/20 1255      OT SHORT TERM GOAL #1   Title Pt will be independent with updated PD-specific HEP--check STGs 07/12/20    Time 4    Period Weeks    Status New      OT SHORT TERM GOAL #2   Title Pt will increase bilateral standing functional reach to 10 inches or greater bilaterally for decreased fall risk during ADLs/IADLs.    Baseline R-8", L-9"    Time 4    Period Weeks    Status New      OT SHORT TERM GOAL #3   Title Pt will demonstrate improved RUE coordination and reach for ADLs as evidenced by increasing box and blocks score to 42 blocks    Baseline RUE 38, LUE 48    Time 4    Period Weeks    Status New      OT SHORT TERM GOAL #4   Title Pt will be able to  wrist at least 3 sentences with 100% legibility and only min decr in size.    Baseline --    Time 4    Period Weeks    Status New      OT SHORT TERM GOAL #5   Title --             OT Long Term Goals - 06/14/20 1258      OT LONG TERM GOAL #1   Title Pt will verbalize understanding of updated strategies for ADLs to incr ease/safety and decr risk of future complications (including positioning of L shoulder).--check LTGs 07/29/20    Time 6    Period Weeks    Status New      OT LONG TERM GOAL #2   Title Pt will demo at least 145* L shoulder flex with 2/10 pain or less and -15* elbow ext for functional reach.    Baseline 140* with -20* elbow ext    Time 6    Period Weeks    Status New      OT LONG TERM GOAL #3   Title Pt will improve R hand coordination  for ADLs as shown by improving time on 9-hole peg test 4 sec.    Baseline 41.16sec    Time 6    Period Weeks    Status New      OT LONG TERM GOAL #4   Title Pt will improve bilateral hand coordination for ADLs as shown by fastening/unfastening 3 buttons in 44sec or less.    Baseline 48.35sec    Time 6    Period Weeks    Status New      OT LONG TERM GOAL #5   Title --                 Plan - 06/14/20 1247    Clinical Impression Statement Pt is a 65 y.o. male with Parkinson's disease who returns to occupational therapy due to slowing of ADLs (pt request for evaluation vs. scheduled follow-up screen).  Pt last seen for occupational therapy 12/2019 and is familiar to this therapist.  Pt with PMH that includes:CAD, HTN, HDL, obesity, asthma, DM, stage 3 CKD, vitamin D deficiency, REM sleep disorder, OSA, aortic atherosclerosis, hx of gout, GERD, hx of anxiety.  Pt presents today with decr ROM, decr coordination, rigidity, tremor, abnormal posture, decr balance for ADLs, intermittent L shoulder pain, mild cognitive deficits, decr functional mobility, and bradykinesia.  Pt would benefit from occupational therapy to address  these deficits for improved UE functional use, incr performance with ADLs/IADLs, and to decr risk of future complications related to PD.    OT Occupational Profile and History Detailed Assessment- Review of Records and additional review of physical, cognitive, psychosocial history related to current functional performance    Occupational performance deficits (Please refer to evaluation for details): ADL's;IADL's;Leisure    Body Structure / Function / Physical Skills ADL;Balance;IADL;ROM;Improper spinal/pelvic alignment;Mobility;Tone;UE functional use;Decreased knowledge of use of DME;Pain;Coordination;FMC;Dexterity;Flexibility   bradykinesia   Cognitive Skills Memory    Rehab Potential Good    Clinical Decision Making Several treatment options, min-mod task modification necessary    Comorbidities Affecting Occupational Performance: May have comorbidities impacting occupational performance    Modification or Assistance to Complete Evaluation  Min-Moderate modification of tasks or assist with assess necessary to complete eval    OT Frequency 2x / week    OT Duration 6 weeks   +eval or 13 visits total   OT Treatment/Interventions Self-care/ADL training;DME and/or AE instruction;Balance training;Therapeutic activities;Aquatic Therapy;Therapeutic exercise;Cognitive remediation/compensation;Passive range of motion;Functional Mobility Training;Neuromuscular education;Manual Therapy;Patient/family education;Moist Heat;Cryotherapy;Ultrasound;Energy conservation;Electrical Stimulation;Fluidtherapy;Paraffin    Plan PWR! supine, review proper positioning of LUE, coordination    Consulted and Agree with Plan of Care Patient           Patient will benefit from skilled therapeutic intervention in order to improve the following deficits and impairments:   Body Structure / Function / Physical Skills: ADL,Balance,IADL,ROM,Improper spinal/pelvic alignment,Mobility,Tone,UE functional use,Decreased knowledge of use  of DME,Pain,Coordination,FMC,Dexterity,Flexibility (bradykinesia) Cognitive Skills: Memory     Visit Diagnosis: Other symptoms and signs involving the nervous system  Other lack of coordination  Other symptoms and signs involving the musculoskeletal system  Abnormal posture  Chronic left shoulder pain  Stiffness of left shoulder, not elsewhere classified  Unsteadiness on feet  Other abnormalities of gait and mobility  Tremor    Problem List Patient Active Problem List   Diagnosis Date Noted  . Primary renal papillary carcinoma, right ([place]) 02/18/2020  . Hepatic steatosis 02/18/2020  . Major depression in remission ([place]) 11/11/2019  . Central sleep apnea due to Cheyne-Stokes respiration 08/31/2019  .  Elevated PSA 08/13/2019  . Hyperlipidemia associated with type 2 diabetes mellitus ([place]) 07/09/2019  . Aortic atherosclerosis ([place]) 06/10/2018  . Controlled type 2 diabetes mellitus with diabetic dermatitis, without long-term current use of insulin ([place]) 01/21/2018  . OSA treated with BiPAP 01/03/2017  . REM sleep behavior disorder 12/06/2016  . CKD stage 3 due to type 2 diabetes mellitus ([place]) 10/23/2016  . Medication management 10/23/2016  . Vitamin D deficiency 10/23/2016  . Parkinson's disease ([employer]) 01/04/2016  . Seasonal and perennial allergic rhinitis 06/21/2014  . Asthma, mild intermittent, well-controlled 06/21/2014  . Pseudomonas aeruginosa colonization 10/08/2012  . CAD (coronary artery disease) 11/15/2011  . Hypertension 11/15/2011  . Morbid obesity with BMI of 40.0-44.9, adult ([place]) 11/15/2011    [facility] 06/14/2020, 1:05 PM  [facility] [address] [place], [place], [address] Phone: [phone]   Fax:  [phone]  Name: Todd Rice MRN: [id number] Date of Birth: [birth year]   Vianne Bulls, OTR/L [facility] [address] [place], [place]   [address] [phone] phone [phone] 06/14/20 1:05 PM

## 2020-06-15 NOTE — Therapy (Signed)
[facility] [address] [place], [place], [address] Phone: [phone]   Fax:  [phone]  Physical Therapy Evaluation  Patient Details  Name: Todd Rice MRN: [id number] Date of Birth: 1956/02/16 Referring Provider (PT): [name]   Encounter Date: 06/14/2020   PT End of Session - 06/15/20 0735    Visit Number 1    Number of Visits 13    Date for PT Re-Evaluation 08/14/20    Authorization Type UHC Medicare-PT/ST combined $30 copay; OT separate $30 copay    Progress Note Due on Visit 10    PT Start Time 1020    PT Stop Time 1100    PT Time Calculation (min) 40 min    Activity Tolerance Patient tolerated treatment well    Behavior During Therapy [facility] for tasks assessed/performed           Past Medical History:  Diagnosis Date  . Adult BMI 50.0-59.9 kg/sq m    52.77  . Anal fissure   . Anxiety   . Diabetes mellitus without complication (HCC)    borderline  . GERD (gastroesophageal reflux disease)    30 years ago, maybe from peptic ulcer  . H/O: gout    STABLE  . Hepatic steatosis 10/01/11  . History of kidney stones   . Hyperlipidemia   . Hypertension   . Kidney tumor    right upper kidney tumor, monitoring  . OSA on CPAP    [facility]  . Parkinson disease ([place]) 01/04/2016  . REM sleep behavior disorder 12/06/2016  . Right ureteral stone   . Ureteral calculus, right 06/12/2011  . Vitamin D deficiency   . Weakness     Past Surgical History:  Procedure Laterality Date  . ANAL FISSURE REPAIR  10-12-2000  . COLONOSCOPY N/A 07/01/2012   Procedure: COLONOSCOPY;  Surgeon: Lafayette Dragon, MD;  [facility]: [facility];  Service: Endoscopy;  Laterality: N/A;  . CYSTOSCOPY WITH RETROGRADE PYELOGRAM, URETEROSCOPY AND STENT PLACEMENT Bilateral 02/26/2013   Procedure: CYSTOSCOPY WITH RETROGRADE PYELOGRAM, URETEROSCOPY AND STENT PLACEMENT;  Surgeon: Alexis Frock, MD;  [facility]: [facility];  Service: Urology;   Laterality: Bilateral;  . HOLMIUM LASER APPLICATION Bilateral 41/32/4401   Procedure: HOLMIUM LASER APPLICATION;  Surgeon: Alexis Frock, MD;  Location: [facility];  Service: Urology;  Laterality: Bilateral;  . kidney stone removal     06/2011-stent placement  . LEFT MEDIAL FEMORAL CONDYLE DEBRIDEMENT & DRILLING/ REMOVAL LOOSE BODY  09-15-2003  . NASAL SEPTUM SURGERY  1985  . STONE EXTRACTION WITH BASKET  06/12/2011   Procedure: STONE EXTRACTION WITH BASKET;  Surgeon: Claybon Jabs, MD;  Location: [facility];  Service: Urology;  Laterality: Right;    There were no vitals filed for this visit.    Subjective Assessment - 06/14/20 1023    Subjective Been a while since I've seen you for physical therapy.  The newest thing is the R hand tremor, worsens with stress.  Pt reports things are just slowing.  Had one stumble on steps outside during the snow-caught myself before I fell.  R side is the most involved side with my Parkinson's.  No other falls.  Have some days where everything is slow motion, harder for me to think, and just don't feel all that good.    Patient Stated Goals Pt's goals for physical therapy are to want to make sure things continue to go well with walking and balance.    Currently in Pain? No/denies  occasional pain with overhead lifting/external rotation L shoulder 2-3/10; pt to have OT eval to address             [facility] PT Assessment - 06/14/20 1032      Assessment   Medical Diagnosis Parkinson's disease    Referring Provider (PT) [name]    Onset Date/Surgical Date 05/29/20   per pt request-eval instead of PT screen   Hand Dominance Right    Prior Therapy last seen by neuro PT late 2020      Precautions   Precautions Fall    Precaution Comments Hx of L shoulder pain with overhead reach, external rotation/abduction motions      Balance Screen   Has the patient fallen in the past 6 months No    Has the patient had a decrease in activity level because of  a fear of falling?  No    Is the patient reluctant to leave their home because of a fear of falling?  No      Home Social worker Private residence    Living Arrangements Spouse/significant other    Available Help at Discharge Family    Type of [place] to enter    Entrance Stairs-Number of Steps 1    Entrance Stairs-Rails None    Home Layout Two level;Able to live on main level with bedroom/bathroom      Prior Function   Level of Independence Independent    Vocation Retired    Leisure Walks the dog; does household chores      Observation/Other Assessments   Focus on Therapeutic Outcomes (FOTO)  NA      Posture/Postural Control   Posture/Postural Control Postural limitations    Postural Limitations Rounded Shoulders;Forward head    Posture Comments Increased tremor with challenges      Tone   Assessment Location Right Lower Extremity;Left Lower Extremity      ROM / Strength   AROM / PROM / Strength AROM;Strength      AROM   Overall AROM  Within functional limits for tasks performed    Overall AROM Comments for BLEs      Strength   Overall Strength Within functional limits for tasks performed    Overall Strength Comments Grossly tested at least 4+/5 to 5/5 BLES      Transfers   Transfers Sit to Stand;Stand to Sit    Sit to Stand 6: Modified independent (Device/Increase time);Without upper extremity assist;From chair/3-in-1    Five time sit to stand comments  14.34    Stand to Sit 6: Modified independent (Device/Increase time);Without upper extremity assist;To chair/3-in-1      Ambulation/Gait   Ambulation/Gait Yes    Ambulation/Gait Assistance 6: Modified independent (Device/Increase time)    Ambulation Distance (Feet) 250 Feet    Assistive device None    Gait Pattern Step-through pattern;Decreased arm swing - right;Decreased arm swing - left;Decreased step length - right;Decreased trunk rotation   Pt reports occasional R  foot catching with gait-not noted in PT eval today   Ambulation Surface Level;Indoor    Gait velocity 11.68 sec = 2.81 ft/sec      Standardized Balance Assessment   Standardized Balance Assessment Timed Up and Go Test;Mini-BESTest      Mini-BESTest   Sit To Stand Normal: Comes to stand without use of hands and stabilizes independently.    Rise to Toes Normal: Stable for 3 s with maximum height.  Stand on one leg (left) Moderate: < 20 s   13.15, 13.16   Stand on one leg (right) Moderate: < 20 s   10.53, 11.97   Stand on one leg - lowest score 1    Compensatory Stepping Correction - Forward Normal: Recovers independently with a single, large step (second realignement is allowed).    Compensatory Stepping Correction - Backward Normal: Recovers independently with a single, large step    Compensatory Stepping Correction - Left Lateral Normal: Recovers independently with 1 step (crossover or lateral OK)    Compensatory Stepping Correction - Right Lateral Normal: Recovers independently with 1 step (crossover or lateral OK)    Stepping Corredtion Lateral - lowest score 2    Stance - Feet together, eyes open, firm surface  Normal: 30s    Stance - Feet together, eyes closed, foam surface  Normal: 30s    Incline - Eyes Closed Normal: Stands independently 30s and aligns with gravity    Change in Gait Speed Moderate: Unable to change walking speed or signs of imbalance    Walk with head turns - Horizontal Moderate: performs head turns with reduction in gait speed.    Walk with pivot turns Normal: Turns with feet close FAST (< 3 steps) with good balance.    Step over obstacles Moderate: Steps over box but touches box OR displays cautious behavior by slowing gait.    Timed UP & GO with Dual Task Moderate: Dual Task affects either counting OR walking (>10%) when compared to the TUG without Dual Task.    Mini-BEST total score 23      Timed Up and Go Test   Normal TUG (seconds) 13.09    Manual TUG  (seconds) 15.22    Cognitive TUG (seconds) 15.25    TUG Comments Scores >10% difference with TUG and TUG cognitive indicate difficulty with dual tasking.      RLE Tone   RLE Tone Mild      LLE Tone   LLE Tone Mild                      Objective measurements completed on examination: See above findings.               PT Education - 06/15/20 0734    Education Details PT eval results, POC    Person(s) Educated Patient    Methods Explanation    Comprehension Verbalized understanding            PT Short Term Goals - 06/15/20 0741      PT SHORT TERM GOAL #1   Title Pt will be independent with HEP for improved balance, transfers, gait.  TARGET 07/16/2020    Time 4    Period Weeks    Status New      PT SHORT TERM GOAL #2   Title Pt will improve 5x sit<>stand score to less than or equal to 11.5 sec for improved transfer efficiency and functional lower extremity strength.    Time 4    Period Weeks    Status New      PT SHORT TERM GOAL #3   Title Pt will improve TUG and TUG cognitive score to less than or equal to 10% difference for improved ability with dual tasking with gait.    Baseline 13.09, 15.25    Time 4    Period Weeks    Status New  PT Long Term Goals - 06/15/20 0743      PT LONG TERM GOAL #1   Title Pt will independent with progression of HEP to address Parkinson's deficits for improved strength, balance and gait.  TARGET 07/30/2020    Time 6    Period Weeks    Status New      PT LONG TERM GOAL #2   Title Pt will perform 8 of 10 reps of sit<>stand from <18" surface, no UE support, no LOB, for improved functional strength and transfer efficiency.    Time 6    Period Weeks    Status New      PT LONG TERM GOAL #3   Title Pt will improve MiniBESTest to at least 25/28 for decreased fall risk.    Baseline 23/28    Time 6    Period Weeks    Status New      PT LONG TERM GOAL #4   Title Pt will improve gait velocity to at  least 3 ft/sec for improved gait efficiency and safety.    Baseline 2.81 ft/sec    Time 6    Period Weeks    Status New      PT LONG TERM GOAL #5   Title Pt will ambulate at least 1000 ft, indoor and outdoor surfaces, independently, no LOB for improved community gait.    Time 6    Period Weeks    Status New                  Plan - 06/15/20 0736    Clinical Impression Statement Pt is a 65 year old male with history of Parkinson's disease, known to this clinic from previous bouts of therapy.  Most recent therapy at this clinic for PD/PT was in late 2020.  Pt presents to OPPT today with reports of overall slowed mobility and one near fall.  He presents with slowed mobility measures in 5x sit<>stand test, gait velocity, and TUG scores compared to last PT measures in 04/2019.  He presents with bradykinesia, rigidity, RUE tremor worse with stressors and challenging movement patterns, abnormal posture, decreased balance, difficulty with dual tasking, decreased timing and coordination of gait.  He walks his dog in the neighborhood and performs household chores.  He would benefit from skilled PT to address the above stated deficits for decreased fall risk and overall improved functional mobility.    Personal Factors and Comorbidities Comorbidity 3+    Comorbidities See PMH list    Examination-Activity Limitations Locomotion Level;Transfers;Stairs;Stand    Examination-Participation Restrictions Community Activity;Other   walking dog in neighborhood   Stability/Clinical Decision Making Evolving/Moderate complexity    Clinical Decision Making Moderate    Rehab Potential Good    PT Frequency 2x / week    PT Duration 6 weeks   plus eval   PT Treatment/Interventions ADLs/Self Care Home Management;Neuromuscular re-education;Balance training;Therapeutic exercise;Therapeutic activities;Functional mobility training;Stair training;Gait training;Patient/family education    PT Next Visit Plan Initiate  HEP, walking program for home; PWR! Moves as well as functional strengtheing, balance, trunk rotation and arm swing activities.    Consulted and Agree with Plan of Care Patient           Patient will benefit from skilled therapeutic intervention in order to improve the following deficits and impairments:  Abnormal gait,Difficulty walking,Impaired tone,Decreased balance,Decreased mobility,Decreased strength,Postural dysfunction  Visit Diagnosis: Other abnormalities of gait and mobility  Unsteadiness on feet  Muscle weakness (generalized)  Other symptoms and signs  involving the nervous system  Abnormal posture     Problem List Patient Active Problem List   Diagnosis Date Noted  . Primary renal papillary carcinoma, right ([place]) 02/18/2020  . Hepatic steatosis 02/18/2020  . Major depression in remission ([place]) 11/11/2019  . Central sleep apnea due to Cheyne-Stokes respiration 08/31/2019  . Elevated PSA 08/13/2019  . Hyperlipidemia associated with type 2 diabetes mellitus ([place]) 07/09/2019  . Aortic atherosclerosis ([place]) 06/10/2018  . Controlled type 2 diabetes mellitus with diabetic dermatitis, without long-term current use of insulin ([place]) 01/21/2018  . OSA treated with BiPAP 01/03/2017  . REM sleep behavior disorder 12/06/2016  . CKD stage 3 due to type 2 diabetes mellitus ([place]) 10/23/2016  . Medication management 10/23/2016  . Vitamin D deficiency 10/23/2016  . Parkinson's disease ([place]) 01/04/2016  . Seasonal and perennial allergic rhinitis 06/21/2014  . Asthma, mild intermittent, well-controlled 06/21/2014  . Pseudomonas aeruginosa colonization 10/08/2012  . CAD (coronary artery disease) 11/15/2011  . Hypertension 11/15/2011  . Morbid obesity with BMI of 40.0-44.9, adult ([place]) 11/15/2011    Chayce Robbins W. 06/15/2020, 7:47 AM  Frazier Butt., PT   [facility] [address] [place], [place], [address] Phone:  [phone]   Fax:  [phone]  Name: AZAR SOUTH MRN: [id number] Date of Birth: [birth year]

## 2020-06-21 ENCOUNTER — Other Ambulatory Visit: Payer: Self-pay | Admitting: Adult Health

## 2020-06-21 DIAGNOSIS — S39012A Strain of muscle, fascia and tendon of lower back, initial encounter: Secondary | ICD-10-CM

## 2020-06-21 MED ORDER — PREDNISONE 20 MG PO TABS: ORAL_TABLET | ORAL | 0 refills | Status: DC

## 2020-06-21 MED FILL — predniSONE 20 MG TABS: 20 | 8 days supply | Qty: 13 | Fill #0

## 2020-06-22 ENCOUNTER — Encounter: Payer: Self-pay | Admitting: Physical Therapy

## 2020-06-22 ENCOUNTER — Ambulatory Visit: Payer: Medicare Other | Admitting: Occupational Therapy

## 2020-06-22 ENCOUNTER — Other Ambulatory Visit: Payer: Self-pay

## 2020-06-22 ENCOUNTER — Ambulatory Visit: Payer: Medicare Other | Admitting: Physical Therapy

## 2020-06-22 DIAGNOSIS — R2689 Other abnormalities of gait and mobility: Secondary | ICD-10-CM

## 2020-06-22 DIAGNOSIS — R2681 Unsteadiness on feet: Secondary | ICD-10-CM

## 2020-06-22 DIAGNOSIS — R293 Abnormal posture: Secondary | ICD-10-CM | POA: Diagnosis not present

## 2020-06-22 DIAGNOSIS — R251 Tremor, unspecified: Secondary | ICD-10-CM | POA: Diagnosis not present

## 2020-06-22 DIAGNOSIS — R29898 Other symptoms and signs involving the musculoskeletal system: Secondary | ICD-10-CM | POA: Diagnosis not present

## 2020-06-22 DIAGNOSIS — R29818 Other symptoms and signs involving the nervous system: Secondary | ICD-10-CM

## 2020-06-22 DIAGNOSIS — M6281 Muscle weakness (generalized): Secondary | ICD-10-CM | POA: Diagnosis not present

## 2020-06-22 DIAGNOSIS — R278 Other lack of coordination: Secondary | ICD-10-CM | POA: Diagnosis not present

## 2020-06-22 DIAGNOSIS — G8929 Other chronic pain: Secondary | ICD-10-CM | POA: Diagnosis not present

## 2020-06-22 DIAGNOSIS — M25612 Stiffness of left shoulder, not elsewhere classified: Secondary | ICD-10-CM | POA: Diagnosis not present

## 2020-06-22 DIAGNOSIS — M25512 Pain in left shoulder: Secondary | ICD-10-CM | POA: Diagnosis not present

## 2020-06-22 NOTE — Therapy (Signed)
[facility] [address] [place], [place], [address] Phone: [phone]   Fax:  [phone]  Physical Therapy Treatment  Patient Details  Name: Todd Rice MRN: [id number] Date of Birth: 1955-05-12 Referring Provider (PT): [name]   Encounter Date: 06/22/2020   PT End of Session - 06/22/20 0756    Visit Number 2    Number of Visits 13    Date for PT Re-Evaluation 08/14/20    Authorization Type UHC Medicare-PT/ST combined $30 copay; OT separate $30 copay    Progress Note Due on Visit 10    PT Start Time 0718    PT Stop Time 0758    PT Time Calculation (min) 40 min    Activity Tolerance Patient tolerated treatment well    Behavior During Therapy [facility] for tasks assessed/performed           Past Medical History:  Diagnosis Date  . Adult BMI 50.0-59.9 kg/sq m    52.77  . Anal fissure   . Anxiety   . Diabetes mellitus without complication (HCC)    borderline  . GERD (gastroesophageal reflux disease)    30 years ago, maybe from peptic ulcer  . H/O: gout    STABLE  . Hepatic steatosis 10/01/11  . History of kidney stones   . Hyperlipidemia   . Hypertension   . Kidney tumor    right upper kidney tumor, monitoring  . OSA on CPAP    [facility]  . Parkinson disease ([place]) 01/04/2016  . REM sleep behavior disorder 12/06/2016  . Right ureteral stone   . Ureteral calculus, right 06/12/2011  . Vitamin D deficiency   . Weakness     Past Surgical History:  Procedure Laterality Date  . ANAL FISSURE REPAIR  10-12-2000  . COLONOSCOPY N/A 07/01/2012   Procedure: COLONOSCOPY;  Surgeon: Lafayette Dragon, MD;  [facility];  Service: Endoscopy;  Laterality: N/A;  . CYSTOSCOPY WITH RETROGRADE PYELOGRAM, URETEROSCOPY AND STENT PLACEMENT Bilateral 02/26/2013   Procedure: CYSTOSCOPY WITH RETROGRADE PYELOGRAM, URETEROSCOPY AND STENT PLACEMENT;  Surgeon: Alexis Frock, MD;  [facility];  Service: Urology;   Laterality: Bilateral;  . HOLMIUM LASER APPLICATION Bilateral 77/41/4239   Procedure: HOLMIUM LASER APPLICATION;  Surgeon: Alexis Frock, MD;  [facility];  Service: Urology;  Laterality: Bilateral;  . kidney stone removal     06/2011-stent placement  . LEFT MEDIAL FEMORAL CONDYLE DEBRIDEMENT & DRILLING/ REMOVAL LOOSE BODY  09-15-2003  . NASAL SEPTUM SURGERY  1985  . STONE EXTRACTION WITH BASKET  06/12/2011   Procedure: STONE EXTRACTION WITH BASKET;  Surgeon: Claybon Jabs, MD;  Location: [facility];  Service: Urology;  Laterality: Right;    There were no vitals filed for this visit.   Subjective Assessment - 06/22/20 0720    Subjective Had some gout in R big toe that started over the weekend; started on Prednisone, and it feels slightly better.    Patient Stated Goals Pt's goals for physical therapy are to want to make sure things continue to go well with walking and balance.    Currently in Pain? Yes    Pain Score 7     Pain Location Toe (Comment which one)   R great toe   Pain Orientation Right    Pain Descriptors / Indicators Aching;Throbbing;Stabbing    Pain Type Acute pain    Pain Onset In the past 7 days    Pain Frequency Constant  Aggravating Factors  started, due to gout    Pain Relieving Factors medication                             [facility] Adult PT Treatment/Exercise - 06/22/20 0723      Transfers   Transfers Sit to Stand;Stand to Sit    Sit to Stand 6: Modified independent (Device/Increase time)    Stand to Sit 6: Modified independent (Device/Increase time);Without upper extremity assist;To chair/3-in-1    Number of Reps 10 reps;Other sets (comment)   3 sets, 22" mat, 18" chair, 17" chair   Transfer Cueing Cues for scooting forward to edge      Ambulation/Gait   Ambulation/Gait Yes    Ambulation/Gait Assistance 6: Modified independent (Device/Increase time)    Ambulation Distance (Feet) 460 Feet   230 ft with B walking poles  for facilitating arm swing; 230 ft with no poles, increased arm swing/effort and increased step length (rates effort as 6/10)   Assistive device None    Gait Pattern Step-through pattern;Decreased arm swing - right;Decreased arm swing - left;Decreased step length - right;Decreased trunk rotation    Ambulation Surface Level;Indoor    Gait Comments Cues for posture, step length, arm swing.  Rates effort as 2-3/10;  when he uses walking poles at home, he rates effort as 5-6/10 (20-25 minutes at home)      Exercises   Exercises Knee/Hip      Knee/Hip Exercises: Aerobic   Stepper Seated SciFit Stepper, Level 1.4>1.8 4 extremities, x 6 minutes, for lower extremity strength and flexibility.  RPM >70 throughout.               Balance Exercises - 06/22/20 0001      Balance Exercises: Standing   Retro Gait 4 reps;Limitations    Retro Gait Limitations Forward stepping/back stepping along counter, counting steps forward 6 steps; then with cues for increased step length, pt able to take 3 steps forward.  Repeated 3 reps    Sidestepping 3 reps;Limitations    Sidestepping Limitations R and L along counter, 5 steps; then with cues for incr. step length, pt takes 2-2.5 steps, 2 reps           PWR! Moves in standing:  PWR! Up for posture x 10 (cues for correct posture and L arm positioning; cues to relax/not hike L shoulder) PWR! Rock for weightshifting x 10 (cues for relaxed transition through middle) PWR! Twist for trunk rotation, x 10 reps, each side, modified in corner position reaching across body to wall targets PWR! Step for step initiation, x 10 (initial cues for step length/height and for hand position)    PT Short Term Goals - 06/15/20 0741      PT SHORT TERM GOAL #1   Title Pt will be independent with HEP for improved balance, transfers, gait.  TARGET 07/16/2020    Time 4    Period Weeks    Status New      PT SHORT TERM GOAL #2   Title Pt will improve 5x sit<>stand score to  less than or equal to 11.5 sec for improved transfer efficiency and functional lower extremity strength.    Time 4    Period Weeks    Status New      PT SHORT TERM GOAL #3   Title Pt will improve TUG and TUG cognitive score to less than or equal to 10% difference for improved ability  with dual tasking with gait.    Baseline 13.09, 15.25    Time 4    Period Weeks    Status New             PT Long Term Goals - 06/15/20 0743      PT LONG TERM GOAL #1   Title Pt will independent with progression of HEP to address Parkinson's deficits for improved strength, balance and gait.  TARGET 07/30/2020    Time 6    Period Weeks    Status New      PT LONG TERM GOAL #2   Title Pt will perform 8 of 10 reps of sit<>stand from <18" surface, no UE support, no LOB, for improved functional strength and transfer efficiency.    Time 6    Period Weeks    Status New      PT LONG TERM GOAL #3   Title Pt will improve MiniBESTest to at least 25/28 for decreased fall risk.    Baseline 23/28    Time 6    Period Weeks    Status New      PT LONG TERM GOAL #4   Title Pt will improve gait velocity to at least 3 ft/sec for improved gait efficiency and safety.    Baseline 2.81 ft/sec    Time 6    Period Weeks    Status New      PT LONG TERM GOAL #5   Title Pt will ambulate at least 1000 ft, indoor and outdoor surfaces, independently, no LOB for improved community gait.    Time 6    Period Weeks    Status New                 Plan - 06/22/20 0757    Clinical Impression Statement Skilled PT session focused on functional strength, gait, and PWR! Moves activities, with emphasis on driving increased effort/intensity throughout activities.  Pt tolerates well without complaints. PT requests pt bring in HEP folder from previous bouts of therapy to update/modify as needed.    Personal Factors and Comorbidities Comorbidity 3+    Comorbidities See PMH list    Examination-Activity Limitations Locomotion  Level;Transfers;Stairs;Stand    Examination-Participation Restrictions Community Activity;Other   walking dog in neighborhood   Stability/Clinical Decision Making Evolving/Moderate complexity    Rehab Potential Good    PT Frequency 2x / week    PT Duration 6 weeks   plus eval   PT Treatment/Interventions ADLs/Self Care Home Management;Neuromuscular re-education;Balance training;Therapeutic exercise;Therapeutic activities;Functional mobility training;Stair training;Gait training;Patient/family education    PT Next Visit Plan Pt to bring in HEP folder-make sure pt is doing PWR! Moves standing (may modify trunk rotation to corner), functional strengthening with sit<>stand; work on anterior/posterior weigthshifting; if weather nice, can work on outdoor gait with walking poles    Consulted and Agree with Plan of Care Patient           Patient will benefit from skilled therapeutic intervention in order to improve the following deficits and impairments:  Abnormal gait,Difficulty walking,Impaired tone,Decreased balance,Decreased mobility,Decreased strength,Postural dysfunction  Visit Diagnosis: Other abnormalities of gait and mobility  Unsteadiness on feet  Muscle weakness (generalized)     Problem List Patient Active Problem List   Diagnosis Date Noted  . Primary renal papillary carcinoma, right ([place]) 02/18/2020  . Hepatic steatosis 02/18/2020  . Major depression in remission ([place]) 11/11/2019  . Central sleep apnea due to Cheyne-Stokes respiration 08/31/2019  . Elevated PSA 08/13/2019  .  Hyperlipidemia associated with type 2 diabetes mellitus ([place]) 07/09/2019  . Aortic atherosclerosis ([place]) 06/10/2018  . Controlled type 2 diabetes mellitus with diabetic dermatitis, without long-term current use of insulin ([place]) 01/21/2018  . OSA treated with BiPAP 01/03/2017  . REM sleep behavior disorder 12/06/2016  . CKD stage 3 due to type 2 diabetes mellitus ([place]) 10/23/2016  . Medication  management 10/23/2016  . Vitamin D deficiency 10/23/2016  . Parkinson's disease ([place]) 01/04/2016  . Seasonal and perennial allergic rhinitis 06/21/2014  . Asthma, mild intermittent, well-controlled 06/21/2014  . Pseudomonas aeruginosa colonization 10/08/2012  . CAD (coronary artery disease) 11/15/2011  . Hypertension 11/15/2011  . Morbid obesity with BMI of 40.0-44.9, adult ([place]) 11/15/2011    Akila Batta W. 06/22/2020, 8:02 AM  Frazier Butt., PT   [facility] [address] [place], [place], [address] Phone: [phone]   Fax:  [phone]  Name: Todd Rice MRN: [id number] Date of Birth: [birth year]

## 2020-06-22 NOTE — Therapy (Signed)
[place] [facility] [address] [place], [place], [address] Phone: [phone]   Fax:  [phone]  Occupational Therapy Treatment  Patient Details  Name: Todd Rice MRN: [id number] Date of Birth: 03-Apr-1956 Referring Provider (OT): Dr. Margette Fast   Encounter Date: 06/22/2020   OT End of Session - 06/22/20 0737    Visit Number 2    Number of Visits 13    Date for OT Re-Evaluation 07/29/20    Authorization Type [employer] [place] 2021, no deductible, VL: [place], no auth    Authorization - Visit Number 2    Authorization - Number of Visits 10    Progress Note Due on Visit 10    OT Start Time 0802    OT Stop Time 0845    OT Time Calculation (min) 43 min    Activity Tolerance Patient tolerated treatment well    Behavior During Therapy [facility] for tasks assessed/performed           Past Medical History:  Diagnosis Date  . Adult BMI 50.0-59.9 kg/sq m    52.77  . Anal fissure   . Anxiety   . Diabetes mellitus without complication (HCC)    borderline  . GERD (gastroesophageal reflux disease)    30 years ago, maybe from peptic ulcer  . H/O: gout    STABLE  . Hepatic steatosis 10/01/11  . History of kidney stones   . Hyperlipidemia   . Hypertension   . Kidney tumor    right upper kidney tumor, monitoring  . OSA on CPAP    [facility]  . Parkinson disease ([place]) 01/04/2016  . REM sleep behavior disorder 12/06/2016  . Right ureteral stone   . Ureteral calculus, right 06/12/2011  . Vitamin D deficiency   . Weakness     Past Surgical History:  Procedure Laterality Date  . ANAL FISSURE REPAIR  10-12-2000  . COLONOSCOPY N/A 07/01/2012   Procedure: COLONOSCOPY;  Surgeon: Lafayette Dragon, MD;  Location: [facility];  Service: Endoscopy;  Laterality: N/A;  . CYSTOSCOPY WITH RETROGRADE PYELOGRAM, URETEROSCOPY AND STENT PLACEMENT Bilateral 02/26/2013   Procedure: CYSTOSCOPY WITH RETROGRADE PYELOGRAM, URETEROSCOPY AND STENT PLACEMENT;   Surgeon: Alexis Frock, MD;  [facility]: [facility];  Service: Urology;  Laterality: Bilateral;  . HOLMIUM LASER APPLICATION Bilateral 43/15/4008   Procedure: HOLMIUM LASER APPLICATION;  Surgeon: Alexis Frock, MD;  Location: [facility];  Service: Urology;  Laterality: Bilateral;  . kidney stone removal     06/2011-stent placement  . LEFT MEDIAL FEMORAL CONDYLE DEBRIDEMENT & DRILLING/ REMOVAL LOOSE BODY  09-15-2003  . NASAL SEPTUM SURGERY  1985  . STONE EXTRACTION WITH BASKET  06/12/2011   Procedure: STONE EXTRACTION WITH BASKET;  Surgeon: Claybon Jabs, MD;  Location: [facility];  Service: Urology;  Laterality: Right;    There were no vitals filed for this visit.   Subjective Assessment - 06/22/20 0736    Subjective  Pt reports gout flare on R big toe--pt on meds now    Pertinent History Parkinson's Disease.  PMH:CAD, HTN, HDL, obesity, asthma, DM, stage 3 CKD, vitamin D deficiency, REM sleep disorder, OSA, aortic atherosclerosis, hx of gout, GERD, hx of anxiety    Patient Stated Goals improve use of hands    Currently in Pain? No/denies   only with certain movements L shoulder   Pain Onset More than a month ago             Standing, closed-chain shoulder  flex with ball with BUEs floor>overhead with focus on large amplitude movements x10, then diagonals floor>overhead to each side with trunk rotation and wt. Shift with min difficulty with balance (close supervision) and cueing for incr movement amplitude/elbow ext with RUE x10 each.  PWR! Hands basic 4 with min cueing for incr movement amplitude.  Coordination Activities with each hand with focus on large amplitude movements:  Flipping cards, dealing cards with thumb, shuffling cards,  Sliding cards across table--all with min cueing for incr movement amplitude and deliberate movements.  Pt responded well to cueing.  Rotating 2 golf balls in each hand with mod difficulty L hand and max difficulty R hand and pt needed min  cueing/facilitation, but able to complete x3-4 with R hand.          OT Short Term Goals - 06/14/20 1255      OT SHORT TERM GOAL #1   Title Pt will be independent with updated PD-specific HEP--check STGs 07/12/20    Time 4    Period Weeks    Status New      OT SHORT TERM GOAL #2   Title Pt will increase bilateral standing functional reach to 10 inches or greater bilaterally for decreased fall risk during ADLs/IADLs.    Baseline R-8", L-9"    Time 4    Period Weeks    Status New      OT SHORT TERM GOAL #3   Title Pt will demonstrate improved RUE coordination and reach for ADLs as evidenced by increasing box and blocks score to 42 blocks    Baseline RUE 38, LUE 48    Time 4    Period Weeks    Status New      OT SHORT TERM GOAL #4   Title Pt will be able to wrist at least 3 sentences with 100% legibility and only min decr in size.    Baseline --    Time 4    Period Weeks    Status New      OT SHORT TERM GOAL #5   Title --             OT Long Term Goals - 06/14/20 1258      OT LONG TERM GOAL #1   Title Pt will verbalize understanding of updated strategies for ADLs to incr ease/safety and decr risk of future complications (including positioning of L shoulder).--check LTGs 07/29/20    Time 6    Period Weeks    Status New      OT LONG TERM GOAL #2   Title Pt will demo at least 145* L shoulder flex with 2/10 pain or less and -15* elbow ext for functional reach.    Baseline 140* with -20* elbow ext    Time 6    Period Weeks    Status New      OT LONG TERM GOAL #3   Title Pt will improve R hand coordination for ADLs as shown by improving time on 9-hole peg test 4 sec.    Baseline 41.16sec    Time 6    Period Weeks    Status New      OT LONG TERM GOAL #4   Title Pt will improve bilateral hand coordination for ADLs as shown by fastening/unfastening 3 buttons in 44sec or less.    Baseline 48.35sec    Time 6    Period Weeks    Status New      OT LONG TERM  GOAL #5   Title --                 Plan - 06/22/20 0737    Clinical Impression Statement Pt demo good response to cueing for large amplitude movements and deliberate movements.  Pt does demo incr L hand tremors today but improved with cueing for deliberate movements.    OT Occupational Profile and History Detailed Assessment- Review of Records and additional review of physical, cognitive, psychosocial history related to current functional performance    Occupational performance deficits (Please refer to evaluation for details): ADL's;IADL's;Leisure    Body Structure / Function / Physical Skills ADL;Balance;IADL;ROM;Improper spinal/pelvic alignment;Mobility;Tone;UE functional use;Decreased knowledge of use of DME;Pain;Coordination;FMC;Dexterity;Flexibility   bradykinesia   Cognitive Skills Memory    Rehab Potential Good    Clinical Decision Making Several treatment options, min-mod task modification necessary    Comorbidities Affecting Occupational Performance: May have comorbidities impacting occupational performance    Modification or Assistance to Complete Evaluation  Min-Moderate modification of tasks or assist with assess necessary to complete eval    OT Frequency 2x / week    OT Duration 6 weeks   +eval or 13 visits total   OT Treatment/Interventions Self-care/ADL training;DME and/or AE instruction;Balance training;Therapeutic activities;Aquatic Therapy;Therapeutic exercise;Cognitive remediation/compensation;Passive range of motion;Functional Mobility Training;Neuromuscular education;Manual Therapy;Patient/family education;Moist Heat;Cryotherapy;Ultrasound;Energy conservation;Electrical Stimulation;Fluidtherapy;Paraffin    Plan PWR! supine, review proper positioning of LUE to decr pain, coordination/functional reach    Consulted and Agree with Plan of Care Patient           Patient will benefit from skilled therapeutic intervention in order to improve the following deficits and  impairments:   Body Structure / Function / Physical Skills: ADL,Balance,IADL,ROM,Improper spinal/pelvic alignment,Mobility,Tone,UE functional use,Decreased knowledge of use of DME,Pain,Coordination,FMC,Dexterity,Flexibility (bradykinesia) Cognitive Skills: Memory     Visit Diagnosis: Other symptoms and signs involving the nervous system  Other lack of coordination  Other symptoms and signs involving the musculoskeletal system  Abnormal posture  Stiffness of left shoulder, not elsewhere classified  Unsteadiness on feet  Chronic left shoulder pain    Problem List Patient Active Problem List   Diagnosis Date Noted  . Primary renal papillary carcinoma, right ([place]) 02/18/2020  . Hepatic steatosis 02/18/2020  . Major depression in remission ([place]) 11/11/2019  . Central sleep apnea due to Cheyne-Stokes respiration 08/31/2019  . Elevated PSA 08/13/2019  . Hyperlipidemia associated with type 2 diabetes mellitus ([place]) 07/09/2019  . Aortic atherosclerosis ([place]) 06/10/2018  . Controlled type 2 diabetes mellitus with diabetic dermatitis, without long-term current use of insulin ([place]) 01/21/2018  . OSA treated with BiPAP 01/03/2017  . REM sleep behavior disorder 12/06/2016  . CKD stage 3 due to type 2 diabetes mellitus ([place]) 10/23/2016  . Medication management 10/23/2016  . Vitamin D deficiency 10/23/2016  . Parkinson's disease ([place]) 01/04/2016  . Seasonal and perennial allergic rhinitis 06/21/2014  . Asthma, mild intermittent, well-controlled 06/21/2014  . Pseudomonas aeruginosa colonization 10/08/2012  . CAD (coronary artery disease) 11/15/2011  . Hypertension 11/15/2011  . Morbid obesity with BMI of 40.0-44.9, adult ([place]) 11/15/2011    [facility] 06/22/2020, 9:02 AM  [place] [address] [place], [place], [address] Phone: [phone]   Fax:  [phone]  Name: Todd Rice MRN: [id number] Date of  Birth: July 10, 1955   Vianne Bulls, OTR/L [facility] [address] [place], [place]  [address] [phone] phone [phone] 06/22/20 9:02 AM

## 2020-06-25 ENCOUNTER — Other Ambulatory Visit: Payer: Self-pay

## 2020-06-25 ENCOUNTER — Encounter: Payer: Self-pay | Admitting: Physical Therapy

## 2020-06-25 ENCOUNTER — Ambulatory Visit: Payer: Medicare Other | Admitting: Physical Therapy

## 2020-06-25 ENCOUNTER — Ambulatory Visit: Payer: Medicare Other | Admitting: Occupational Therapy

## 2020-06-25 DIAGNOSIS — M25512 Pain in left shoulder: Secondary | ICD-10-CM | POA: Diagnosis not present

## 2020-06-25 DIAGNOSIS — R29818 Other symptoms and signs involving the nervous system: Secondary | ICD-10-CM | POA: Diagnosis not present

## 2020-06-25 DIAGNOSIS — R2681 Unsteadiness on feet: Secondary | ICD-10-CM

## 2020-06-25 DIAGNOSIS — M6281 Muscle weakness (generalized): Secondary | ICD-10-CM | POA: Diagnosis not present

## 2020-06-25 DIAGNOSIS — M25612 Stiffness of left shoulder, not elsewhere classified: Secondary | ICD-10-CM | POA: Diagnosis not present

## 2020-06-25 DIAGNOSIS — R2689 Other abnormalities of gait and mobility: Secondary | ICD-10-CM | POA: Diagnosis not present

## 2020-06-25 DIAGNOSIS — G8929 Other chronic pain: Secondary | ICD-10-CM

## 2020-06-25 DIAGNOSIS — R278 Other lack of coordination: Secondary | ICD-10-CM | POA: Diagnosis not present

## 2020-06-25 DIAGNOSIS — R293 Abnormal posture: Secondary | ICD-10-CM

## 2020-06-25 DIAGNOSIS — R29898 Other symptoms and signs involving the musculoskeletal system: Secondary | ICD-10-CM

## 2020-06-25 DIAGNOSIS — R251 Tremor, unspecified: Secondary | ICD-10-CM | POA: Diagnosis not present

## 2020-06-25 NOTE — Patient Instructions (Signed)
From previous HEP packet, advised pt to perform the following:  PWR! Moves in standing x 20 reps, once per day  Forward step and weigthshift x10  Back step and weightshift x 10  Partial tandem stance in corner:  -on pillow with EO and EC, head turns x 5 reps  -head nods x 5 reps  Forward step ups (step up/up, down/down x 10), side step ups (single leg x 10), foot propped on step (3 sec each, 10 reps)  Pt has all pictures of handouts in new HEP folder

## 2020-06-25 NOTE — Therapy (Signed)
[place] [address] [place], [place], [address] Phone: [phone]   Fax:  [phone]  Occupational Therapy Treatment  Patient Details  Name: Todd Rice MRN: [id number] Date of Birth: 07/11/1955 Referring Provider (OT): Dr. Margette Fast   Encounter Date: 06/25/2020   OT End of Session - 06/25/20 0807    Visit Number 3    Number of Visits 13    Date for OT Re-Evaluation 07/29/20    Authorization - Visit Number 3    Authorization - Number of Visits 10    OT Start Time 0806    OT Stop Time 0845    OT Time Calculation (min) 39 min    Activity Tolerance Patient tolerated treatment well    Behavior During Therapy [facility] [place] [facility] for tasks assessed/performed           Past Medical History:  Diagnosis Date  . Adult BMI 50.0-59.9 kg/sq m    52.77  . Anal fissure   . Anxiety   . Diabetes mellitus without complication (HCC)    borderline  . GERD (gastroesophageal reflux disease)    30 years ago, maybe from peptic ulcer  . H/O: gout    STABLE  . Hepatic steatosis 10/01/11  . History of kidney stones   . Hyperlipidemia   . Hypertension   . Kidney tumor    right upper kidney tumor, monitoring  . OSA on CPAP    [facility]  . Parkinson disease ([place]) 01/04/2016  . REM sleep behavior disorder 12/06/2016  . Right ureteral stone   . Ureteral calculus, right 06/12/2011  . Vitamin D deficiency   . Weakness     Past Surgical History:  Procedure Laterality Date  . ANAL FISSURE REPAIR  10-12-2000  . COLONOSCOPY N/A 07/01/2012   Procedure: COLONOSCOPY;  Surgeon: Lafayette Dragon, MD;  [facility];  Service: Endoscopy;  Laterality: N/A;  . CYSTOSCOPY WITH RETROGRADE PYELOGRAM, URETEROSCOPY AND STENT PLACEMENT Bilateral 02/26/2013   Procedure: CYSTOSCOPY WITH RETROGRADE PYELOGRAM, URETEROSCOPY AND STENT PLACEMENT;  Surgeon: Alexis Frock, MD;  [facility];  Service: Urology;  Laterality: Bilateral;  . HOLMIUM  LASER APPLICATION Bilateral 82/80/0349   Procedure: HOLMIUM LASER APPLICATION;  Surgeon: Alexis Frock, MD;  [facility];  Service: Urology;  Laterality: Bilateral;  . kidney stone removal     06/2011-stent placement  . LEFT MEDIAL FEMORAL CONDYLE DEBRIDEMENT & DRILLING/ REMOVAL LOOSE BODY  09-15-2003  . NASAL SEPTUM SURGERY  1985  . STONE EXTRACTION WITH BASKET  06/12/2011   Procedure: STONE EXTRACTION WITH BASKET;  Surgeon: Claybon Jabs, MD;  Location[facility];  Service: Urology;  Laterality: Right;    There were no vitals filed for this visit.   Subjective Assessment - 06/25/20 0837    Pertinent History Parkinson's Disease.  PMH:CAD, HTN, HDL, obesity, asthma, DM, stage 3 CKD, vitamin D deficiency, REM sleep disorder, OSA, aortic atherosclerosis, hx of gout, GERD, hx of anxiety    Patient Stated Goals improve use of hands    Currently in Pain? Yes    Pain Score 1     Pain Location Shoulder    Pain Orientation Left    Pain Descriptors / Indicators Aching    Pain Type Acute pain    Pain Onset In the past 7 days    Pain Frequency Intermittent    Aggravating Factors  malpositioning    Pain Relieving Factors repositioning  Treatment: Picking up and stacking coins with left and right UE's, min v.c for larger amplitude movements then placing in slot with in hand manipulation Stacking blocks and unstacking simultaneously with left and right UE's min v.c for finger extension following release. Ambulating while tossing scarf with left and right UE's min v.c for step length and larger movements Dual tasking while tossing ball between hands and performing category generation, min v.c             OT Education - 06/25/20 0844    Education Details PWR! basic 4 in supine, min v.c for larger amplitude movements, PWR! hands basic 4    Person(s) Educated Patient    Methods Explanation;Demonstration;Verbal cues;Handout    Comprehension  Verbalized understanding;Returned demonstration;Verbal cues required            OT Short Term Goals - 06/14/20 1255      OT SHORT TERM GOAL #1   Title Pt will be independent with updated PD-specific HEP--check STGs 07/12/20    Time 4    Period Weeks    Status New      OT SHORT TERM GOAL #2   Title Pt will increase bilateral standing functional reach to 10 inches or greater bilaterally for decreased fall risk during ADLs/IADLs.    Baseline R-8", L-9"    Time 4    Period Weeks    Status New      OT SHORT TERM GOAL #3   Title Pt will demonstrate improved RUE coordination and reach for ADLs as evidenced by increasing box and blocks score to 42 blocks    Baseline RUE 38, LUE 48    Time 4    Period Weeks    Status New      OT SHORT TERM GOAL #4   Title Pt will be able to wrist at least 3 sentences with 100% legibility and only min decr in size.    Baseline --    Time 4    Period Weeks    Status New      OT SHORT TERM GOAL #5   Title --             OT Long Term Goals - 06/14/20 1258      OT LONG TERM GOAL #1   Title Pt will verbalize understanding of updated strategies for ADLs to incr ease/safety and decr risk of future complications (including positioning of L shoulder).--check LTGs 07/29/20    Time 6    Period Weeks    Status New      OT LONG TERM GOAL #2   Title Pt will demo at least 145* L shoulder flex with 2/10 pain or less and -15* elbow ext for functional reach.    Baseline 140* with -20* elbow ext    Time 6    Period Weeks    Status New      OT LONG TERM GOAL #3   Title Pt will improve R hand coordination for ADLs as shown by improving time on 9-hole peg test 4 sec.    Baseline 41.16sec    Time 6    Period Weeks    Status New      OT LONG TERM GOAL #4   Title Pt will improve bilateral hand coordination for ADLs as shown by fastening/unfastening 3 buttons in 44sec or less.    Baseline 48.35sec    Time 6    Period Weeks    Status New  OT LONG  TERM GOAL #5   Title --                 Plan - 06/25/20 0843    Clinical Impression Statement Pt is progressing towards goals. He demonstrates good performance of PWR! moves in supine today.    OT Occupational Profile and History Detailed Assessment- Review of Records and additional review of physical, cognitive, psychosocial history related to current functional performance    Occupational performance deficits (Please refer to evaluation for details): ADL's;IADL's;Leisure    Body Structure / Function / Physical Skills ADL;Balance;IADL;ROM;Improper spinal/pelvic alignment;Mobility;Tone;UE functional use;Decreased knowledge of use of DME;Pain;Coordination;FMC;Dexterity;Flexibility   bradykinesia   Cognitive Skills Memory    Rehab Potential Good    Clinical Decision Making Several treatment options, min-mod task modification necessary    Comorbidities Affecting Occupational Performance: May have comorbidities impacting occupational performance    Modification or Assistance to Complete Evaluation  Min-Moderate modification of tasks or assist with assess necessary to complete eval    OT Frequency 2x / week    OT Duration 6 weeks   +eval or 13 visits total   OT Treatment/Interventions Self-care/ADL training;DME and/or AE instruction;Balance training;Therapeutic activities;Aquatic Therapy;Therapeutic exercise;Cognitive remediation/compensation;Passive range of motion;Functional Mobility Training;Neuromuscular education;Manual Therapy;Patient/family education;Moist Heat;Cryotherapy;Ultrasound;Energy conservation;Electrical Stimulation;Fluidtherapy;Paraffin    Plan review proper positioning of LUE to decr pain, coordination/functional reach    Consulted and Agree with Plan of Care Patient           Patient will benefit from skilled therapeutic intervention in order to improve the following deficits and impairments:   Body Structure / Function / Physical Skills: ADL,Balance,IADL,ROM,Improper  spinal/pelvic alignment,Mobility,Tone,UE functional use,Decreased knowledge of use of DME,Pain,Coordination,FMC,Dexterity,Flexibility (bradykinesia) Cognitive Skills: Memory     Visit Diagnosis: Other symptoms and signs involving the nervous system  Other lack of coordination  Other symptoms and signs involving the musculoskeletal system  Abnormal posture  Stiffness of left shoulder, not elsewhere classified  Unsteadiness on feet  Chronic left shoulder pain    Problem List Patient Active Problem List   Diagnosis Date Noted  . Primary renal papillary carcinoma, right ([place]) 02/18/2020  . Hepatic steatosis 02/18/2020  . Major depression in remission ([place]) 11/11/2019  . Central sleep apnea due to Cheyne-Stokes respiration 08/31/2019  . Elevated PSA 08/13/2019  . Hyperlipidemia associated with type 2 diabetes mellitus ([place]) 07/09/2019  . Aortic atherosclerosis ([place]) 06/10/2018  . Controlled type 2 diabetes mellitus with diabetic dermatitis, without long-term current use of insulin ([place]) 01/21/2018  . OSA treated with BiPAP 01/03/2017  . REM sleep behavior disorder 12/06/2016  . CKD stage 3 due to type 2 diabetes mellitus ([place]) 10/23/2016  . Medication management 10/23/2016  . Vitamin D deficiency 10/23/2016  . Parkinson's disease ([place]) 01/04/2016  . Seasonal and perennial allergic rhinitis 06/21/2014  . Asthma, mild intermittent, well-controlled 06/21/2014  . Pseudomonas aeruginosa colonization 10/08/2012  . CAD (coronary artery disease) 11/15/2011  . Hypertension 11/15/2011  . Morbid obesity with BMI of 40.0-44.9, adult ([place]) 11/15/2011    Aniket Paye 06/25/2020, 8:55 AM  [facility] [address] [place], [place], [address] Phone: [phone]   Fax:  [phone]  Name: Todd Rice MRN: [id number] Date of Birth: 1955-07-13

## 2020-06-25 NOTE — Therapy (Signed)
[facility] [address] [place], [place], [address] Phone: [phone]   Fax:  [phone]  Physical Therapy Treatment  Patient Details  Name: Todd Rice MRN: [id number] Date of Birth: 03/26/1956 Referring Provider (PT): [name]   Encounter Date: 06/25/2020   PT End of Session - 06/25/20 0730    Visit Number 3    Number of Visits 13    Date for PT Re-Evaluation 08/14/20    Authorization Type UHC Medicare-PT/ST combined $30 copay; OT separate $30 copay    Progress Note Due on Visit 10    PT Start Time 0718    PT Stop Time 0800    PT Time Calculation (min) 42 min    Activity Tolerance Patient tolerated treatment well    Behavior During Therapy [facility] for tasks assessed/performed           Past Medical History:  Diagnosis Date  . Adult BMI 50.0-59.9 kg/sq m    52.77  . Anal fissure   . Anxiety   . Diabetes mellitus without complication (HCC)    borderline  . GERD (gastroesophageal reflux disease)    30 years ago, maybe from peptic ulcer  . H/O: gout    STABLE  . Hepatic steatosis 10/01/11  . History of kidney stones   . Hyperlipidemia   . Hypertension   . Kidney tumor    right upper kidney tumor, monitoring  . OSA on CPAP    [facility]  . Parkinson disease ([place]) 01/04/2016  . REM sleep behavior disorder 12/06/2016  . Right ureteral stone   . Ureteral calculus, right 06/12/2011  . Vitamin D deficiency   . Weakness     Past Surgical History:  Procedure Laterality Date  . ANAL FISSURE REPAIR  10-12-2000  . COLONOSCOPY N/A 07/01/2012   Procedure: COLONOSCOPY;  Surgeon: Lafayette Dragon, MD;  Location: [facility];  Service: Endoscopy;  Laterality: N/A;  . CYSTOSCOPY WITH RETROGRADE PYELOGRAM, URETEROSCOPY AND STENT PLACEMENT Bilateral 02/26/2013   Procedure: CYSTOSCOPY WITH RETROGRADE PYELOGRAM, URETEROSCOPY AND STENT PLACEMENT;  Surgeon: Alexis Frock, MD;  Location: [facility];  Service: Urology;   Laterality: Bilateral;  . HOLMIUM LASER APPLICATION Bilateral 99/37/1696   Procedure: HOLMIUM LASER APPLICATION;  Surgeon: Alexis Frock, MD;  Location: [facility];  Service: Urology;  Laterality: Bilateral;  . kidney stone removal     06/2011-stent placement  . LEFT MEDIAL FEMORAL CONDYLE DEBRIDEMENT & DRILLING/ REMOVAL LOOSE BODY  09-15-2003  . NASAL SEPTUM SURGERY  1985  . STONE EXTRACTION WITH BASKET  06/12/2011   Procedure: STONE EXTRACTION WITH BASKET;  Surgeon: Claybon Jabs, MD;  Location: [facility];  Service: Urology;  Laterality: Right;    There were no vitals filed for this visit.   Subjective Assessment - 06/25/20 0721    Subjective Nothing new since last visit.  Gout is better    Patient Stated Goals Pt's goals for physical therapy are to want to make sure things continue to go well with walking and balance.    Currently in Pain? No/denies    Pain Onset In the past 7 days                             [facility] Adult PT Treatment/Exercise - 06/25/20 0001      Ambulation/Gait   Ambulation/Gait Yes    Ambulation/Gait Assistance 6: Modified independent (Device/Increase time)    Ambulation Distance (Feet)  400 Feet    Assistive device None    Gait Pattern Step-through pattern;Decreased arm swing - right;Decreased arm swing - left;Decreased step length - right;Decreased trunk rotation    Ambulation Surface Level;Indoor    Gait Comments Cues for arm swing and step length               Balance Exercises - 06/25/20 0001      Balance Exercises: Standing   Standing, One Foot on a Step Eyes open;6 inch;5 reps   3 sec   Step Ups Forward;Lateral;6 inch;UE support 1;Limitations    Step Ups Limitations Forward step up/up, down/down x 10 reps, lateral single leg step ups, instability on R, with UE support needed.    Partial Tandem Stance Eyes open;Foam/compliant surface;5 reps;Eyes closed;Limitations    Partial Tandem Stance Limitations Stood on solid  surface and pillows, head turns/head nods x 5 reps each, EO (only for solid) and on foam, EO and EC           PWR! Moves in standing:   PWR! Up for posture x 10 (cues for correct posture and L arm positioning; cues to relax L shoulder) PWR! Rock for weightshifting x 10 (cues for relaxed transition through middle) PWR! Twist for trunk rotation, x 10 reps, each side, cues to briefly stop in midline.  Then also performed modified in corner position reaching across body to wall targets x 10 PWR! Step for step initiation, x 10 (initial cues for step length/height and for hand position)  Forward step and weightshift x 10 reps, with coordinated UE movements-cues for appropriate step length, and for return to wider BOS in midline Back step and weigthshift x 10 reps, holding to counter for support, first 5 reps, with cues for appropriate step length and for balance upon return to midline.  Then second 5 reps, attempted coordinating UE movements, with decreased posterior weightshifting.   PT Education - 06/25/20 0809    Education Details HEP reviewed from previous sets of therapy, consolidate/revised HEP    Person(s) Educated Patient;Spouse    Methods Explanation;Demonstration;Handout;Verbal cues    Comprehension Verbalized understanding;Returned demonstration            PT Short Term Goals - 06/15/20 0741      PT SHORT TERM GOAL #1   Title Pt will be independent with HEP for improved balance, transfers, gait.  TARGET 07/16/2020    Time 4    Period Weeks    Status New      PT SHORT TERM GOAL #2   Title Pt will improve 5x sit<>stand score to less than or equal to 11.5 sec for improved transfer efficiency and functional lower extremity strength.    Time 4    Period Weeks    Status New      PT SHORT TERM GOAL #3   Title Pt will improve TUG and TUG cognitive score to less than or equal to 10% difference for improved ability with dual tasking with gait.    Baseline 13.09, 15.25    Time 4     Period Weeks    Status New             PT Long Term Goals - 06/15/20 0743      PT LONG TERM GOAL #1   Title Pt will independent with progression of HEP to address Parkinson's deficits for improved strength, balance and gait.  TARGET 07/30/2020    Time 6    Period [place]  Status New      PT LONG TERM GOAL #2   Title Pt will perform 8 of 10 reps of sit<>stand from <18" surface, no UE support, no LOB, for improved functional strength and transfer efficiency.    Time 6    Period Weeks    Status New      PT LONG TERM GOAL #3   Title Pt will improve MiniBESTest to at least 25/28 for decreased fall risk.    Baseline 23/28    Time 6    Period Weeks    Status New      PT LONG TERM GOAL #4   Title Pt will improve gait velocity to at least 3 ft/sec for improved gait efficiency and safety.    Baseline 2.81 ft/sec    Time 6    Period Weeks    Status New      PT LONG TERM GOAL #5   Title Pt will ambulate at least 1000 ft, indoor and outdoor surfaces, independently, no LOB for improved community gait.    Time 6    Period Weeks    Status New                 Plan - 06/25/20 6222    Clinical Impression Statement Skilled PT session today focused on providing updated HEP-see instructions.  Pt brings in HEP folder from multiple previous bouts of therapy, but is not currently performing any exercises.  Added in exercises to address standing PWR! Moves as well as functional SLS and balance.  Pt has some decreased lateral hip stability with RLE SLS and with narrowed BOS.  He will continue to benefit from skilled PT to further address balance and gait for improved overall mobility.    Personal Factors and Comorbidities Comorbidity 3+    Comorbidities See PMH list    Examination-Activity Limitations Locomotion Level;Transfers;Stairs;Stand    Examination-Participation Restrictions Community Activity;Other   walking dog in neighborhood   Stability/Clinical Decision Making  Evolving/Moderate complexity    Rehab Potential Good    PT Frequency 2x / week    PT Duration 6 weeks   plus eval   PT Treatment/Interventions ADLs/Self Care Home Management;Neuromuscular re-education;Balance training;Therapeutic exercise;Therapeutic activities;Functional mobility training;Stair training;Gait training;Patient/family education    PT Next Visit Plan Review HEP from this visit; functional strengthening with sit<>stand and SLS; work on anterior/posterior weigthshifting; if weather nice, can work on outdoor gait with walking poles    Consulted and Agree with Plan of Care Patient           Patient will benefit from skilled therapeutic intervention in order to improve the following deficits and impairments:  Abnormal gait,Difficulty walking,Impaired tone,Decreased balance,Decreased mobility,Decreased strength,Postural dysfunction  Visit Diagnosis: Other abnormalities of gait and mobility  Unsteadiness on feet  Muscle weakness (generalized)     Problem List Patient Active Problem List   Diagnosis Date Noted  . Primary renal papillary carcinoma, right ([place]) 02/18/2020  . Hepatic steatosis 02/18/2020  . Major depression in remission ([place]) 11/11/2019  . Central sleep apnea due to Cheyne-Stokes respiration 08/31/2019  . Elevated PSA 08/13/2019  . Hyperlipidemia associated with type 2 diabetes mellitus ([place]) 07/09/2019  . Aortic atherosclerosis ([place]) 06/10/2018  . Controlled type 2 diabetes mellitus with diabetic dermatitis, without long-term current use of insulin ([place]) 01/21/2018  . OSA treated with BiPAP 01/03/2017  . REM sleep behavior disorder 12/06/2016  . CKD stage 3 due to type 2 diabetes mellitus ([place]) 10/23/2016  . Medication management  10/23/2016  . Vitamin D deficiency 10/23/2016  . Parkinson's disease ([place]) 01/04/2016  . Seasonal and perennial allergic rhinitis 06/21/2014  . Asthma, mild intermittent, well-controlled 06/21/2014  . Pseudomonas aeruginosa  colonization 10/08/2012  . CAD (coronary artery disease) 11/15/2011  . Hypertension 11/15/2011  . Morbid obesity with BMI of 40.0-44.9, adult ([place]) 11/15/2011    Cree Kunert W. 06/25/2020, 8:21 AM  Frazier Butt., PT   [facility] [address] [place], [place], [address] Phone: [phone]   Fax:  [phone]  Name: CORTLIN MARANO MRN: [id number] Date of Birth: 10/26/1955

## 2020-06-29 ENCOUNTER — Ambulatory Visit: Payer: Medicare Other | Admitting: Physical Therapy

## 2020-06-29 ENCOUNTER — Ambulatory Visit: Payer: Medicare Other

## 2020-06-29 ENCOUNTER — Ambulatory Visit: Payer: Medicare Other | Admitting: Occupational Therapy

## 2020-06-30 ENCOUNTER — Other Ambulatory Visit: Payer: Self-pay | Admitting: Internal Medicine

## 2020-06-30 DIAGNOSIS — E119 Type 2 diabetes mellitus without complications: Secondary | ICD-10-CM

## 2020-07-01 MED FILL — metFORMIN HCL ER 500 MG TB2: 500 | 90 days supply | Qty: 360 | Fill #0

## 2020-07-05 ENCOUNTER — Encounter: Payer: Self-pay | Admitting: Physical Therapy

## 2020-07-05 ENCOUNTER — Ambulatory Visit: Payer: Medicare Other | Admitting: Physical Therapy

## 2020-07-05 ENCOUNTER — Ambulatory Visit: Payer: Medicare Other | Admitting: Occupational Therapy

## 2020-07-05 ENCOUNTER — Other Ambulatory Visit: Payer: Self-pay

## 2020-07-05 DIAGNOSIS — M25612 Stiffness of left shoulder, not elsewhere classified: Secondary | ICD-10-CM | POA: Diagnosis not present

## 2020-07-05 DIAGNOSIS — R278 Other lack of coordination: Secondary | ICD-10-CM

## 2020-07-05 DIAGNOSIS — M6281 Muscle weakness (generalized): Secondary | ICD-10-CM | POA: Diagnosis not present

## 2020-07-05 DIAGNOSIS — R29818 Other symptoms and signs involving the nervous system: Secondary | ICD-10-CM | POA: Diagnosis not present

## 2020-07-05 DIAGNOSIS — R29898 Other symptoms and signs involving the musculoskeletal system: Secondary | ICD-10-CM | POA: Diagnosis not present

## 2020-07-05 DIAGNOSIS — R251 Tremor, unspecified: Secondary | ICD-10-CM

## 2020-07-05 DIAGNOSIS — R2681 Unsteadiness on feet: Secondary | ICD-10-CM | POA: Diagnosis not present

## 2020-07-05 DIAGNOSIS — R293 Abnormal posture: Secondary | ICD-10-CM | POA: Diagnosis not present

## 2020-07-05 DIAGNOSIS — R2689 Other abnormalities of gait and mobility: Secondary | ICD-10-CM | POA: Diagnosis not present

## 2020-07-05 DIAGNOSIS — G8929 Other chronic pain: Secondary | ICD-10-CM | POA: Diagnosis not present

## 2020-07-05 DIAGNOSIS — M25512 Pain in left shoulder: Secondary | ICD-10-CM

## 2020-07-05 NOTE — Therapy (Signed)
[facility] [address] [place], [place], [address] Phone: [phone]   Fax:  [phone]  Physical Therapy Treatment  Patient Details  Name: Todd Rice MRN: [id number] Date of Birth: 1955-12-17 Referring Provider (PT): [name]   Encounter Date: 07/05/2020   PT End of Session - 07/05/20 0721    Visit Number 4    Number of Visits 13    Date for PT Re-Evaluation 08/14/20    Authorization Type UHC Medicare-PT/ST combined $30 copay; OT separate $30 copay    Progress Note Due on Visit 10    PT Start Time 0718    PT Stop Time 0759    PT Time Calculation (min) 41 min    Activity Tolerance Patient tolerated treatment well   shortness of breath, O2 94>99%, HR 111 bpm>99 bpm   Behavior During Therapy [facility] for tasks assessed/performed           Past Medical History:  Diagnosis Date  . Adult BMI 50.0-59.9 kg/sq m    52.77  . Anal fissure   . Anxiety   . Diabetes mellitus without complication (HCC)    borderline  . GERD (gastroesophageal reflux disease)    30 years ago, maybe from peptic ulcer  . H/O: gout    STABLE  . Hepatic steatosis 10/01/11  . History of kidney stones   . Hyperlipidemia   . Hypertension   . Kidney tumor    right upper kidney tumor, monitoring  . OSA on CPAP    [facility]  . Parkinson disease ([place]) 01/04/2016  . REM sleep behavior disorder 12/06/2016  . Right ureteral stone   . Ureteral calculus, right 06/12/2011  . Vitamin D deficiency   . Weakness     Past Surgical History:  Procedure Laterality Date  . ANAL FISSURE REPAIR  10-12-2000  . COLONOSCOPY N/A 07/01/2012   Procedure: COLONOSCOPY;  Surgeon: Lafayette Dragon, MD;  [facility];  Service: Endoscopy;  Laterality: N/A;  . CYSTOSCOPY WITH RETROGRADE PYELOGRAM, URETEROSCOPY AND STENT PLACEMENT Bilateral 02/26/2013   Procedure: CYSTOSCOPY WITH RETROGRADE PYELOGRAM, URETEROSCOPY AND STENT PLACEMENT;  Surgeon: Alexis Frock,  MD;  Location: [facility];  Service: Urology;  Laterality: Bilateral;  . HOLMIUM LASER APPLICATION Bilateral 94/76/5465   Procedure: HOLMIUM LASER APPLICATION;  Surgeon: Alexis Frock, MD;  Location: [facility];  Service: Urology;  Laterality: Bilateral;  . kidney stone removal     06/2011-stent placement  . LEFT MEDIAL FEMORAL CONDYLE DEBRIDEMENT & DRILLING/ REMOVAL LOOSE BODY  09-15-2003  . NASAL SEPTUM SURGERY  1985  . STONE EXTRACTION WITH BASKET  06/12/2011   Procedure: STONE EXTRACTION WITH BASKET;  Surgeon: Claybon Jabs, MD;  Location: [facility];  Service: Urology;  Laterality: Right;    There were no vitals filed for this visit.   Subjective Assessment - 07/05/20 0719    Subjective Nothing new    Patient Stated Goals Pt's goals for physical therapy are to want to make sure things continue to go well with walking and balance.    Currently in Pain? No/denies    Pain Onset In the past 7 days                             [facility] Adult PT Treatment/Exercise - 07/05/20 0001      Knee/Hip Exercises: Aerobic   Stepper Seated SciFit Stepper, Level 1.4>1.8 4 extremities, x 6 minutes, for lower  extremity strength and flexibility.  RPM >70 throughout.      Knee/Hip Exercises: Seated   Sit to Sand 10 reps;without UE support   coming up to [facility]! Up posture from mat surface          Reviewed HEP from last visit:  Pt return demo all exercises with min cues throughout.  PWR! Moves in standing x 20 reps, once per day  -PWR! Up  -[place]! Twist-cues provided for brief stop in midline  -PWR! Step Pt return demo understanding, with minimal cues    Forward step and weigthshift x10  Back step and weightshift x 10   Partial tandem stance in corner:             -on pillow with EO and EC, head turns x 5 reps             -head nods x 5 reps   Forward step ups (step up/up, down/down x 10), side step ups (single leg x 10), foot propped on step (3 sec each, 10  reps) (for single leg step ups, performed 5 reps of each, cues for UE support as needed)  During rest breaks, educated patient and had pt demo pursed lip breathing and diaphragm breathing techniques to slow breathing and HR between exercises.  Educated pt initially on PD exercise recommendations from [employer].     Balance Exercises - 07/05/20 0001      Balance Exercises: Standing   Other Standing Exercises Progression of PWR! Rock in standing:  Shift and lift x 5 reps each side, UE support for improved dynamic SLS               PT Short Term Goals - 06/15/20 0741      PT SHORT TERM GOAL #1   Title Pt will be independent with HEP for improved balance, transfers, gait.  TARGET 07/16/2020    Time 4    Period Weeks    Status New      PT SHORT TERM GOAL #2   Title Pt will improve 5x sit<>stand score to less than or equal to 11.5 sec for improved transfer efficiency and functional lower extremity strength.    Time 4    Period Weeks    Status New      PT SHORT TERM GOAL #3   Title Pt will improve TUG and TUG cognitive score to less than or equal to 10% difference for improved ability with dual tasking with gait.    Baseline 13.09, 15.25    Time 4    Period Weeks    Status New             PT Long Term Goals - 06/15/20 0743      PT LONG TERM GOAL #1   Title Pt will independent with progression of HEP to address Parkinson's deficits for improved strength, balance and gait.  TARGET 07/30/2020    Time 6    Period Weeks    Status New      PT LONG TERM GOAL #2   Title Pt will perform 8 of 10 reps of sit<>stand from <18" surface, no UE support, no LOB, for improved functional strength and transfer efficiency.    Time 6    Period Weeks    Status New      PT LONG TERM GOAL #3   Title Pt will improve MiniBESTest to at least 25/28 for decreased fall risk.    Baseline 23/28  Time 6    Period Weeks    Status New      PT LONG TERM GOAL #4   Title Pt will  improve gait velocity to at least 3 ft/sec for improved gait efficiency and safety.    Baseline 2.81 ft/sec    Time 6    Period Weeks    Status New      PT LONG TERM GOAL #5   Title Pt will ambulate at least 1000 ft, indoor and outdoor surfaces, independently, no LOB for improved community gait.    Time 6    Period Weeks    Status New                 Plan - 07/05/20 0800    Clinical Impression Statement REviewed HEP from last visit, with pt return demo understanding.  Pt reports he has performed 4x in the past week.  Shortness of breath noted, and brief rest breaks provided throughout, with initial education on [place] PD exercise guidelines and how that can help him organize his exercises through a week's time.  With RLE SLS activities, pt needs UE support for steadiness.  Pt will continue to benefit from skilled PT to further address balance, strength, and gait towards LTGs.    Personal Factors and Comorbidities Comorbidity 3+    Comorbidities See PMH list    Examination-Activity Limitations Locomotion Level;Transfers;Stairs;Stand    Examination-Participation Restrictions Community Activity;Other   walking dog in neighborhood   Stability/Clinical Decision Making Evolving/Moderate complexity    Rehab Potential Good    PT Frequency 2x / week    PT Duration 6 weeks   plus eval   PT Treatment/Interventions ADLs/Self Care Home Management;Neuromuscular re-education;Balance training;Therapeutic exercise;Therapeutic activities;Functional mobility training;Stair training;Gait training;Patient/family education    PT Next Visit Plan Balance exercises, PWR! Moves standing FLOW;  functional strengthening with sit<>stand and SLS; provide handout and additional education on PD exercise guidelines.  If weather nice, can work on outdoor gait with walking poles    Consulted and Agree with Plan of Care Patient           Patient will benefit from skilled therapeutic intervention in  order to improve the following deficits and impairments:  Abnormal gait,Difficulty walking,Impaired tone,Decreased balance,Decreased mobility,Decreased strength,Postural dysfunction  Visit Diagnosis: Muscle weakness (generalized)  Unsteadiness on feet  Other abnormalities of gait and mobility     Problem List Patient Active Problem List   Diagnosis Date Noted  . Primary renal papillary carcinoma, right ([place]) 02/18/2020  . Hepatic steatosis 02/18/2020  . Major depression in remission ([place]) 11/11/2019  . Central sleep apnea due to Cheyne-Stokes respiration 08/31/2019  . Elevated PSA 08/13/2019  . Hyperlipidemia associated with type 2 diabetes mellitus ([place]) 07/09/2019  . Aortic atherosclerosis ([place]) 06/10/2018  . Controlled type 2 diabetes mellitus with diabetic dermatitis, without long-term current use of insulin ([place]) 01/21/2018  . OSA treated with BiPAP 01/03/2017  . REM sleep behavior disorder 12/06/2016  . CKD stage 3 due to type 2 diabetes mellitus ([place]) 10/23/2016  . Medication management 10/23/2016  . Vitamin D deficiency 10/23/2016  . Parkinson's disease ([place]) 01/04/2016  . Seasonal and perennial allergic rhinitis 06/21/2014  . Asthma, mild intermittent, well-controlled 06/21/2014  . Pseudomonas aeruginosa colonization 10/08/2012  . CAD (coronary artery disease) 11/15/2011  . Hypertension 11/15/2011  . Morbid obesity with BMI of 40.0-44.9, adult ([place]) 11/15/2011    Breckyn Ticas W. 07/05/2020, 8:06 AM  Frazier Butt., PT  [facility] [address] [place], [place], [address] Phone: [phone]   Fax:  [phone]  Name: Todd Rice MRN: [id number] Date of Birth: April 24, 1956

## 2020-07-05 NOTE — Therapy (Signed)
[facility] [address] [place], [place], [address] Phone: [phone]   Fax:  [phone]  Occupational Therapy Treatment  Patient Details  Name: Todd Rice MRN: [id number] Date of Birth: [birth year] Referring Provider (OT): Dr. Margette Fast   Encounter Date: 07/05/2020   OT End of Session - 07/05/20 0809    Visit Number 4    Number of Visits 13    Date for OT Re-Evaluation 07/29/20    Authorization - Visit Number 4    Authorization - Number of Visits 10    OT Start Time 0803    OT Stop Time 0845    OT Time Calculation (min) 42 min    Activity Tolerance Patient tolerated treatment well    Behavior During Therapy [facility] for tasks assessed/performed           Past Medical History:  Diagnosis Date  . Adult BMI 50.0-59.9 kg/sq m    52.77  . Anal fissure   . Anxiety   . Diabetes mellitus without complication (HCC)    borderline  . GERD (gastroesophageal reflux disease)    30 years ago, maybe from peptic ulcer  . H/O: gout    STABLE  . Hepatic steatosis 10/01/11  . History of kidney stones   . Hyperlipidemia   . Hypertension   . Kidney tumor    right upper kidney tumor, monitoring  . OSA on CPAP    [facility]  . Parkinson disease ([place]) 01/04/2016  . REM sleep behavior disorder 12/06/2016  . Right ureteral stone   . Ureteral calculus, right 06/12/2011  . Vitamin D deficiency   . Weakness     Past Surgical History:  Procedure Laterality Date  . ANAL FISSURE REPAIR  10-12-2000  . COLONOSCOPY N/A 07/01/2012   Procedure: COLONOSCOPY;  Surgeon: Lafayette Dragon, MD;  [facility]: [facility];  Service: Endoscopy;  Laterality: N/A;  . CYSTOSCOPY WITH RETROGRADE PYELOGRAM, URETEROSCOPY AND STENT PLACEMENT Bilateral 02/26/2013   Procedure: CYSTOSCOPY WITH RETROGRADE PYELOGRAM, URETEROSCOPY AND STENT PLACEMENT;  Surgeon: Alexis Frock, MD;  [facility]: [facility];  Service: Urology;  Laterality: Bilateral;  . HOLMIUM  LASER APPLICATION Bilateral 72/53/6644   Procedure: HOLMIUM LASER APPLICATION;  Surgeon: Alexis Frock, MD;  Location: [facility];  Service: Urology;  Laterality: Bilateral;  . kidney stone removal     06/2011-stent placement  . LEFT MEDIAL FEMORAL CONDYLE DEBRIDEMENT & DRILLING/ REMOVAL LOOSE BODY  09-15-2003  . NASAL SEPTUM SURGERY  1985  . STONE EXTRACTION WITH BASKET  06/12/2011   Procedure: STONE EXTRACTION WITH BASKET;  Surgeon: Claybon Jabs, MD;  Location: [facility];  Service: Urology;  Laterality: Right;    There were no vitals filed for this visit.   Subjective Assessment - 07/05/20 0808    Subjective  doing ok    Pertinent History Parkinson's Disease.  PMH:CAD, HTN, HDL, obesity, asthma, DM, stage 3 CKD, vitamin D deficiency, REM sleep disorder, OSA, aortic atherosclerosis, hx of gout, GERD, hx of anxiety    Patient Stated Goals improve use of hands    Currently in Pain? No/denies    Pain Onset In the past 7 days            PWR! Hands (Basic 4) x10 each for incr movement amplitude and stretch with min cueing.  Standing, closed-chain with ball with BUEs in diagonals floor>overhead to each side with trunk rotation and wt. Shift with min difficulty with balance (close supervision) and cueing  for incr movement amplitude/elbow ext with RUE x10 each.  Then, chest press to toss/catch medium ball with BUEs with min cueing for large amplitude (elbow ext/finger ext) and deliberate movements as well as step forward with pass.  Standing, tossing beanbags in target with each hand with min cueing for step (wt. Shift) and large amplitude.  Picking up coins with each finger/thumb with each hand with min difficulty initially, but improved with repetition.    Sliding cards across table with finger ext--improved with all cards slid on first attempt.  Standing, hitting targets with boom whackers with each UE with sequence for dual task with min difficulty and cueing for  sequencing (occasional) and large amplitude/associated trunk movements (each UE, alternating UEs).       OT Short Term Goals - 06/14/20 1255      OT SHORT TERM GOAL #1   Title Pt will be independent with updated PD-specific HEP--check STGs 07/12/20    Time 4    Period Weeks    Status New      OT SHORT TERM GOAL #2   Title Pt will increase bilateral standing functional reach to 10 inches or greater bilaterally for decreased fall risk during ADLs/IADLs.    Baseline R-8", L-9"    Time 4    Period Weeks    Status New      OT SHORT TERM GOAL #3   Title Pt will demonstrate improved RUE coordination and reach for ADLs as evidenced by increasing box and blocks score to 42 blocks    Baseline RUE 38, LUE 48    Time 4    Period Weeks    Status New      OT SHORT TERM GOAL #4   Title Pt will be able to wrist at least 3 sentences with 100% legibility and only min decr in size.    Baseline --    Time 4    Period Weeks    Status New      OT SHORT TERM GOAL #5   Title --             OT Long Term Goals - 06/14/20 1258      OT LONG TERM GOAL #1   Title Pt will verbalize understanding of updated strategies for ADLs to incr ease/safety and decr risk of future complications (including positioning of L shoulder).--check LTGs 07/29/20    Time 6    Period Weeks    Status New      OT LONG TERM GOAL #2   Title Pt will demo at least 145* L shoulder flex with 2/10 pain or less and -15* elbow ext for functional reach.    Baseline 140* with -20* elbow ext    Time 6    Period Weeks    Status New      OT LONG TERM GOAL #3   Title Pt will improve R hand coordination for ADLs as shown by improving time on 9-hole peg test 4 sec.    Baseline 41.16sec    Time 6    Period Weeks    Status New      OT LONG TERM GOAL #4   Title Pt will improve bilateral hand coordination for ADLs as shown by fastening/unfastening 3 buttons in 44sec or less.    Baseline 48.35sec    Time 6    Period Weeks     Status New      OT LONG TERM GOAL #5   Title --  Plan - 07/05/20 0810    Clinical Impression Statement Pt is progressing towards goals with improving coordination and movement amplitude.    OT Occupational Profile and History Detailed Assessment- Review of Records and additional review of physical, cognitive, psychosocial history related to current functional performance    Occupational performance deficits (Please refer to evaluation for details): ADL's;IADL's;Leisure    Body Structure / Function / Physical Skills ADL;Balance;IADL;ROM;Improper spinal/pelvic alignment;Mobility;Tone;UE functional use;Decreased knowledge of use of DME;Pain;Coordination;FMC;Dexterity;Flexibility   bradykinesia   Cognitive Skills Memory    Rehab Potential Good    Clinical Decision Making Several treatment options, min-mod task modification necessary    Comorbidities Affecting Occupational Performance: May have comorbidities impacting occupational performance    Modification or Assistance to Complete Evaluation  Min-Moderate modification of tasks or assist with assess necessary to complete eval    OT Frequency 2x / week    OT Duration 6 weeks   +eval or 13 visits total   OT Treatment/Interventions Self-care/ADL training;DME and/or AE instruction;Balance training;Therapeutic activities;Aquatic Therapy;Therapeutic exercise;Cognitive remediation/compensation;Passive range of motion;Functional Mobility Training;Neuromuscular education;Manual Therapy;Patient/family education;Moist Heat;Cryotherapy;Ultrasound;Energy conservation;Electrical Stimulation;Fluidtherapy;Paraffin    Plan continue to review proper positioning of LUE to decr pain, coordination/functional reach, bilateral hand coordination, timing    Consulted and Agree with Plan of Care Patient           Patient will benefit from skilled therapeutic intervention in order to improve the following deficits and impairments:   Body  Structure / Function / Physical Skills: ADL,Balance,IADL,ROM,Improper spinal/pelvic alignment,Mobility,Tone,UE functional use,Decreased knowledge of use of DME,Pain,Coordination,FMC,Dexterity,Flexibility (bradykinesia) Cognitive Skills: Memory     Visit Diagnosis: Other symptoms and signs involving the nervous system  Other lack of coordination  Other symptoms and signs involving the musculoskeletal system  Abnormal posture  Unsteadiness on feet  Tremor  Stiffness of left shoulder, not elsewhere classified  Muscle weakness (generalized)  Chronic left shoulder pain    Problem List Patient Active Problem List   Diagnosis Date Noted  . Primary renal papillary carcinoma, right ([place]) 02/18/2020  . Hepatic steatosis 02/18/2020  . Major depression in remission ([place]) 11/11/2019  . Central sleep apnea due to Cheyne-Stokes respiration 08/31/2019  . Elevated PSA 08/13/2019  . Hyperlipidemia associated with type 2 diabetes mellitus ([place]) 07/09/2019  . Aortic atherosclerosis ([place]) 06/10/2018  . Controlled type 2 diabetes mellitus with diabetic dermatitis, without long-term current use of insulin ([place]) 01/21/2018  . OSA treated with BiPAP 01/03/2017  . REM sleep behavior disorder 12/06/2016  . CKD stage 3 due to type 2 diabetes mellitus ([place]) 10/23/2016  . Medication management 10/23/2016  . Vitamin D deficiency 10/23/2016  . Parkinson's disease ([place]) 01/04/2016  . Seasonal and perennial allergic rhinitis 06/21/2014  . Asthma, mild intermittent, well-controlled 06/21/2014  . Pseudomonas aeruginosa colonization 10/08/2012  . CAD (coronary artery disease) 11/15/2011  . Hypertension 11/15/2011  . Morbid obesity with BMI of 40.0-44.9, adult [facility]) 11/15/2011    [facility] 07/05/2020, 12:16 PM  [facility] [address] [place], [place], [address] Phone: [phone]   Fax:  [phone]  Name: Todd Rice MRN:  [id number] Date of Birth: [birth year]   Vianne Bulls, OTR/L [facility] [address] [place], [place]  [address] [phone] phone [phone] 07/05/20 12:16 PM

## 2020-07-08 ENCOUNTER — Encounter: Payer: Self-pay | Admitting: Physical Therapy

## 2020-07-08 ENCOUNTER — Ambulatory Visit: Payer: Medicare Other | Admitting: Occupational Therapy

## 2020-07-08 ENCOUNTER — Other Ambulatory Visit: Payer: Self-pay

## 2020-07-08 ENCOUNTER — Ambulatory Visit: Payer: Medicare Other | Attending: Neurology | Admitting: Physical Therapy

## 2020-07-08 DIAGNOSIS — R278 Other lack of coordination: Secondary | ICD-10-CM | POA: Diagnosis not present

## 2020-07-08 DIAGNOSIS — R2689 Other abnormalities of gait and mobility: Secondary | ICD-10-CM | POA: Insufficient documentation

## 2020-07-08 DIAGNOSIS — G8929 Other chronic pain: Secondary | ICD-10-CM | POA: Insufficient documentation

## 2020-07-08 DIAGNOSIS — R293 Abnormal posture: Secondary | ICD-10-CM | POA: Diagnosis not present

## 2020-07-08 DIAGNOSIS — R251 Tremor, unspecified: Secondary | ICD-10-CM | POA: Insufficient documentation

## 2020-07-08 DIAGNOSIS — R2681 Unsteadiness on feet: Secondary | ICD-10-CM | POA: Diagnosis not present

## 2020-07-08 DIAGNOSIS — M25612 Stiffness of left shoulder, not elsewhere classified: Secondary | ICD-10-CM | POA: Diagnosis not present

## 2020-07-08 DIAGNOSIS — R29818 Other symptoms and signs involving the nervous system: Secondary | ICD-10-CM

## 2020-07-08 DIAGNOSIS — R29898 Other symptoms and signs involving the musculoskeletal system: Secondary | ICD-10-CM

## 2020-07-08 DIAGNOSIS — M25512 Pain in left shoulder: Secondary | ICD-10-CM | POA: Diagnosis not present

## 2020-07-08 NOTE — Therapy (Signed)
[facility] [address] [place], [place], [address] Phone: [phone]   Fax:  [phone]  Physical Therapy Treatment  Patient Details  Name: Todd Rice MRN: [id number] Date of Birth: Apr 15, 1956 Referring Provider (PT): [name]   Encounter Date: 07/08/2020   PT End of Session - 07/08/20 0807    Visit Number 5    Number of Visits 13    Date for PT Re-Evaluation 08/14/20    Authorization Type [employer] Medicare-PT/ST combined $30 copay; OT separate $30 copay    Progress Note Due on Visit 10    PT Start Time 0806    PT Stop Time 0845    PT Time Calculation (min) 39 min    Activity Tolerance Patient tolerated treatment well   shortness of breath, O2 94>99%, HR 111 bpm>99 bpm   Behavior During Therapy [facility] for tasks assessed/performed           Past Medical History:  Diagnosis Date  . Adult BMI 50.0-59.9 kg/sq m    52.77  . Anal fissure   . Anxiety   . Diabetes mellitus without complication (HCC)    borderline  . GERD (gastroesophageal reflux disease)    30 years ago, maybe from peptic ulcer  . H/O: gout    STABLE  . Hepatic steatosis 10/01/11  . History of kidney stones   . Hyperlipidemia   . Hypertension   . Kidney tumor    right upper kidney tumor, monitoring  . OSA on CPAP    [facility]  . Parkinson disease ([place]) 01/04/2016  . REM sleep behavior disorder 12/06/2016  . Right ureteral stone   . Ureteral calculus, right 06/12/2011  . Vitamin D deficiency   . Weakness     Past Surgical History:  Procedure Laterality Date  . ANAL FISSURE REPAIR  10-12-2000  . COLONOSCOPY N/A 07/01/2012   Procedure: COLONOSCOPY;  Surgeon: Lafayette Dragon, MD;  [facility];  Service: Endoscopy;  Laterality: N/A;  . CYSTOSCOPY WITH RETROGRADE PYELOGRAM, URETEROSCOPY AND STENT PLACEMENT Bilateral 02/26/2013   Procedure: CYSTOSCOPY WITH RETROGRADE PYELOGRAM, URETEROSCOPY AND STENT PLACEMENT;  Surgeon: Alexis Frock,  MD;  [facility];  Service: Urology;  Laterality: Bilateral;  . HOLMIUM LASER APPLICATION Bilateral 37/62/8315   Procedure: HOLMIUM LASER APPLICATION;  Surgeon: Alexis Frock, MD;  Location[facility];  Service: Urology;  Laterality: Bilateral;  . kidney stone removal     06/2011-stent placement  . LEFT MEDIAL FEMORAL CONDYLE DEBRIDEMENT & DRILLING/ REMOVAL LOOSE BODY  09-15-2003  . NASAL SEPTUM SURGERY  1985  . STONE EXTRACTION WITH BASKET  06/12/2011   Procedure: STONE EXTRACTION WITH BASKET;  Surgeon: Claybon Jabs, MD;  Location: [facility];  Service: Urology;  Laterality: Right;    There were no vitals filed for this visit.   Subjective Assessment - 07/08/20 0806    Subjective Was sore and stiff from last visit.  Did a few ex yesterday-felt a lot looser    Patient Stated Goals Pt's goals for physical therapy are to want to make sure things continue to go well with walking and balance.    Currently in Pain? Yes    Pain Score 1     Pain Location Shoulder    Pain Orientation Left    Pain Descriptors / Indicators Aching    Pain Onset In the past 7 days    Pain Frequency Intermittent    Aggravating Factors  malpositioning, reaching  Pain Relieving Factors gentle movement, stretching                             [facility] Adult PT Treatment/Exercise - 07/08/20 0001      Ambulation/Gait   Ambulation/Gait Yes    Ambulation/Gait Assistance 6: Modified independent (Device/Increase time)    Ambulation Distance (Feet) 345 Feet    Assistive device None;Other (Comment)   used walking poles to facilitate arm swing   Gait Pattern Step-through pattern;Decreased arm swing - right;Decreased arm swing - left;Decreased step length - right;Decreased trunk rotation    Ambulation Surface Level;Indoor    Gait Comments Quick stop/starts and forward/back changes of direction, no overt LOB      High Level Balance   High Level Balance Activities Marching  forwards;Marching backwards;Tandem walking    High Level Balance Comments 3 reps along counter, no UE support; fwd/back tandem walk along counter, cues for full foot placement on RLE to lessen eversion with R foot placement      Knee/Hip Exercises: Standing   Knee Flexion Strengthening;Right;Left;2 sets;10 reps    Knee Flexion Limitations 2nd set alternating    Hip Abduction Stengthening;Right;Left;2 sets;10 reps    Abduction Limitations 2nd set alternating legs    Hip Extension Stengthening;Right;Left;2 sets;10 reps;Knee straight    Extension Limitations 2nd set alternating legs    Functional Squat 10 reps;2 sets    Functional Squat Limitations Standing on Airex, squat>up on toes               Balance Exercises - 07/08/20 0001      Balance Exercises: Standing   Standing Eyes Opened Wide (BOA);Narrow base of support (BOS);Foam/compliant surface;5 reps;Head turns   Head nods   Standing Eyes Closed Wide (BOA);Narrow base of support (BOS);Foam/compliant surface;Head turns;5 reps   Head nods   SLS with Vectors Solid surface;Upper extremity assist 1;Limitations    SLS with Vectors Limitations Alt step taps to 12" step, x 10 reps, step taps to 6", 12", 6" step x 10 reps    Wall Bumps Hip;Eyes opened;10 reps   on Airex   Heel Raises Both;10 reps   on foam   Toe Raise Both;10 reps   foam   Other Standing Exercises Sidestep/together at counter, no support, 10 reps each direction; then sidestep/together squat x 10 reps           Standing PWR! Moves Flow  PWR! UP>PWR! Rock>PWR! Twist>PWR! Step, x 5 reps Visual and verbal cues. Pt tends to skip the rock motion, needs cues to follow correct sequence      PT Short Term Goals - 06/15/20 0741      PT SHORT TERM GOAL #1   Title Pt will be independent with HEP for improved balance, transfers, gait.  TARGET 07/16/2020    Time 4    Period Weeks    Status New      PT SHORT TERM GOAL #2   Title Pt will improve 5x sit<>stand score to less  than or equal to 11.5 sec for improved transfer efficiency and functional lower extremity strength.    Time 4    Period Weeks    Status New      PT SHORT TERM GOAL #3   Title Pt will improve TUG and TUG cognitive score to less than or equal to 10% difference for improved ability with dual tasking with gait.    Baseline 13.09, 15.25    Time  4    Period Weeks    Status New             PT Long Term Goals - 06/15/20 0743      PT LONG TERM GOAL #1   Title Pt will independent with progression of HEP to address Parkinson's deficits for improved strength, balance and gait.  TARGET 07/30/2020    Time 6    Period Weeks    Status New      PT LONG TERM GOAL #2   Title Pt will perform 8 of 10 reps of sit<>stand from <18" surface, no UE support, no LOB, for improved functional strength and transfer efficiency.    Time 6    Period Weeks    Status New      PT LONG TERM GOAL #3   Title Pt will improve MiniBESTest to at least 25/28 for decreased fall risk.    Baseline 23/28    Time 6    Period Weeks    Status New      PT LONG TERM GOAL #4   Title Pt will improve gait velocity to at least 3 ft/sec for improved gait efficiency and safety.    Baseline 2.81 ft/sec    Time 6    Period Weeks    Status New      PT LONG TERM GOAL #5   Title Pt will ambulate at least 1000 ft, indoor and outdoor surfaces, independently, no LOB for improved community gait.    Time 6    Period Weeks    Status New                 Plan - 07/08/20 6283    Clinical Impression Statement Skilled PT session focus today on static, dynamic, SLS balance on solid and compliant surfaces.  More difficulty with narrowed positions and with R SLS. With VCs, he transitions from BUE to 1 UE support for single limb stance exercises.    Personal Factors and Comorbidities Comorbidity 3+    Comorbidities See PMH list    Examination-Activity Limitations Locomotion Level;Transfers;Stairs;Stand    Examination-Participation  Restrictions Community Activity;Other   walking dog in neighborhood   Stability/Clinical Decision Making Evolving/Moderate complexity    Rehab Potential Good    PT Frequency 2x / week    PT Duration 6 weeks   plus eval   PT Treatment/Interventions ADLs/Self Care Home Management;Neuromuscular re-education;Balance training;Therapeutic exercise;Therapeutic activities;Functional mobility training;Stair training;Gait training;Patient/family education    PT Next Visit Plan Continue balance exercises, PWR! Moves standing FLOW-try compliant surfaces;  functional strengthening with sit<>stand and SLS; provide handout and additional education on PD exercise guidelines.  If weather nice, can work on outdoor gait with walking poles    Consulted and Agree with Plan of Care Patient           Patient will benefit from skilled therapeutic intervention in order to improve the following deficits and impairments:  Abnormal gait,Difficulty walking,Impaired tone,Decreased balance,Decreased mobility,Decreased strength,Postural dysfunction  Visit Diagnosis: Unsteadiness on feet  Other abnormalities of gait and mobility     Problem List Patient Active Problem List   Diagnosis Date Noted  . Primary renal papillary carcinoma, right ([place]) 02/18/2020  . Hepatic steatosis 02/18/2020  . Major depression in remission ([place]) 11/11/2019  . Central sleep apnea due to Cheyne-Stokes respiration 08/31/2019  . Elevated PSA 08/13/2019  . Hyperlipidemia associated with type 2 diabetes mellitus ([place]) 07/09/2019  . Aortic atherosclerosis ([place]) 06/10/2018  . Controlled type 2  diabetes mellitus with diabetic dermatitis, without long-term current use of insulin ([place]) 01/21/2018  . OSA treated with BiPAP 01/03/2017  . REM sleep behavior disorder 12/06/2016  . CKD stage 3 due to type 2 diabetes mellitus ([place]) 10/23/2016  . Medication management 10/23/2016  . Vitamin D deficiency 10/23/2016  . Parkinson's disease ([place])  01/04/2016  . Seasonal and perennial allergic rhinitis 06/21/2014  . Asthma, mild intermittent, well-controlled 06/21/2014  . Pseudomonas aeruginosa colonization 10/08/2012  . CAD (coronary artery disease) 11/15/2011  . Hypertension 11/15/2011  . Morbid obesity with BMI of 40.0-44.9, adult ([place]) 11/15/2011    Jakki Doughty W. 07/08/2020, 8:54 AM  Frazier Butt., PT   [facility] [address] [place], [place], [address] Phone: [phone]   Fax:  [phone]  Name: LYNETTE NOAH MRN: [id number] Date of Birth: [birth year]

## 2020-07-08 NOTE — Therapy (Signed)
[facility] [address] [place], [place], [address] Phone: [phone]   Fax:  [phone]  Occupational Therapy Treatment  Patient Details  Name: Todd Rice MRN: [id number] Date of Birth: 06/21/1955 Referring Provider (OT): Dr. Margette Fast   Encounter Date: 07/08/2020   OT End of Session - 07/08/20 0734    Visit Number 5    Number of Visits 13    Date for OT Re-Evaluation 07/29/20    Authorization Type UHC [place] 2021, no deductible, VL: [place], no auth    Authorization - Visit Number 5    Authorization - Number of Visits 10    Progress Note Due on Visit 10    OT Start Time 0720    OT Stop Time 0800    OT Time Calculation (min) 40 min    Activity Tolerance Patient tolerated treatment well    Behavior During Therapy [facility] for tasks assessed/performed           Past Medical History:  Diagnosis Date  . Adult BMI 50.0-59.9 kg/sq m    52.77  . Anal fissure   . Anxiety   . Diabetes mellitus without complication (HCC)    borderline  . GERD (gastroesophageal reflux disease)    30 years ago, maybe from peptic ulcer  . H/O: gout    STABLE  . Hepatic steatosis 10/01/11  . History of kidney stones   . Hyperlipidemia   . Hypertension   . Kidney tumor    right upper kidney tumor, monitoring  . OSA on CPAP    [facility]  . Parkinson disease ([place]) 01/04/2016  . REM sleep behavior disorder 12/06/2016  . Right ureteral stone   . Ureteral calculus, right 06/12/2011  . Vitamin D deficiency   . Weakness     Past Surgical History:  Procedure Laterality Date  . ANAL FISSURE REPAIR  10-12-2000  . COLONOSCOPY N/A 07/01/2012   Procedure: COLONOSCOPY;  Surgeon: Lafayette Dragon, MD;  [facility];  Service: Endoscopy;  Laterality: N/A;  . CYSTOSCOPY WITH RETROGRADE PYELOGRAM, URETEROSCOPY AND STENT PLACEMENT Bilateral 02/26/2013   Procedure: CYSTOSCOPY WITH RETROGRADE PYELOGRAM, URETEROSCOPY AND STENT PLACEMENT;   Surgeon: Alexis Frock, MD;  [facility];  Service: Urology;  Laterality: Bilateral;  . HOLMIUM LASER APPLICATION Bilateral 88/41/6606   Procedure: HOLMIUM LASER APPLICATION;  Surgeon: Alexis Frock, MD;  [facility];  Service: Urology;  Laterality: Bilateral;  . kidney stone removal     06/2011-stent placement  . LEFT MEDIAL FEMORAL CONDYLE DEBRIDEMENT & DRILLING/ REMOVAL LOOSE BODY  09-15-2003  . NASAL SEPTUM SURGERY  1985  . STONE EXTRACTION WITH BASKET  06/12/2011   Procedure: STONE EXTRACTION WITH BASKET;  Surgeon: Claybon Jabs, MD;  [facility];  Service: Urology;  Laterality: Right;    There were no vitals filed for this visit.   Subjective Assessment - 07/08/20 0720    Subjective  doing ok    Pertinent History Parkinson's Disease.  PMH:CAD, HTN, HDL, obesity, asthma, DM, stage 3 CKD, vitamin D deficiency, REM sleep disorder, OSA, aortic atherosclerosis, hx of gout, GERD, hx of anxiety    Patient Stated Goals improve use of hands    Currently in Pain? No/denies    Pain Onset In the past 7 days           Standing at wall:  Wall slides for shoulder flex stretch (thumbs up), PWR! Step with reach along wall, PWR! Rock at  wall for wt. Shift (cross body), PWR! Twist with back to wall for improved posture x10-20 each.   In quadruped, PWR! Up, rock, and twist x10-20 each with min v.c. for large amplitude movements.    Completing Purdue Pegboard with both hands simultaneously for bilateral hand coordination and timing.  Writing:  Vertical lines, circles, and zig-zags with focus/cueing for large amplitude, touch top/bottom of lines for visual targets.  Then writing 4 sentences with good legibility and min decr in size for 4th sentence.    Fastening/unfastening buttons with min difficulty and cueing for large amplitude movement/strategies.    Flipping cards with both hands simultaneously for improved timing and bilateral hand coordination.       OT Short Term Goals - 07/08/20 0819      OT SHORT TERM GOAL #1   Title Pt will be independent with updated PD-specific HEP--check STGs 07/12/20    Time 4    Period Weeks    Status New      OT SHORT TERM GOAL #2   Title Pt will increase bilateral standing functional reach to 10 inches or greater bilaterally for decreased fall risk during ADLs/IADLs.    Baseline R-8", L-9"    Time 4    Period Weeks    Status New      OT SHORT TERM GOAL #3   Title Pt will demonstrate improved RUE coordination and reach for ADLs as evidenced by increasing box and blocks score to 42 blocks    Baseline RUE 38, LUE 48    Time 4    Period Weeks    Status New      OT SHORT TERM GOAL #4   Title Pt will be able to wrist at least 3 sentences with 100% legibility and only min decr in size.    Baseline --    Time 4    Period Weeks    Status Achieved   07/08/20     OT SHORT TERM GOAL #5   Title --             OT Long Term Goals - 06/14/20 1258      OT LONG TERM GOAL #1   Title Pt will verbalize understanding of updated strategies for ADLs to incr ease/safety and decr risk of future complications (including positioning of L shoulder).--check LTGs 07/29/20    Time 6    Period Weeks    Status New      OT LONG TERM GOAL #2   Title Pt will demo at least 145* L shoulder flex with 2/10 pain or less and -15* elbow ext for functional reach.    Baseline 140* with -20* elbow ext    Time 6    Period Weeks    Status New      OT LONG TERM GOAL #3   Title Pt will improve R hand coordination for ADLs as shown by improving time on 9-hole peg test 4 sec.    Baseline 41.16sec    Time 6    Period Weeks    Status New      OT LONG TERM GOAL #4   Title Pt will improve bilateral hand coordination for ADLs as shown by fastening/unfastening 3 buttons in 44sec or less.    Baseline 48.35sec    Time 6    Period Weeks    Status New      OT LONG TERM GOAL #5   Title --  Plan - 07/08/20 0735     Clinical Impression Statement Pt is progressing towards goals with improving coordination and movement amplitude.  Pt responds well to cueing for incr movement amplitude.    OT Occupational Profile and History Detailed Assessment- Review of Records and additional review of physical, cognitive, psychosocial history related to current functional performance    Occupational performance deficits (Please refer to evaluation for details): ADL's;IADL's;Leisure    Body Structure / Function / Physical Skills ADL;Balance;IADL;ROM;Improper spinal/pelvic alignment;Mobility;Tone;UE functional use;Decreased knowledge of use of DME;Pain;Coordination;FMC;Dexterity;Flexibility   bradykinesia   Cognitive Skills Memory    Rehab Potential Good    Clinical Decision Making Several treatment options, min-mod task modification necessary    Comorbidities Affecting Occupational Performance: May have comorbidities impacting occupational performance    Modification or Assistance to Complete Evaluation  Min-Moderate modification of tasks or assist with assess necessary to complete eval    OT Frequency 2x / week    OT Duration 6 weeks   +eval or 13 visits total   OT Treatment/Interventions Self-care/ADL training;DME and/or AE instruction;Balance training;Therapeutic activities;Aquatic Therapy;Therapeutic exercise;Cognitive remediation/compensation;Passive range of motion;Functional Mobility Training;Neuromuscular education;Manual Therapy;Patient/family education;Moist Heat;Cryotherapy;Ultrasound;Energy conservation;Electrical Stimulation;Fluidtherapy;Paraffin    Plan coordination/functional reach, bilateral hand coordination, timing    Consulted and Agree with Plan of Care Patient           Patient will benefit from skilled therapeutic intervention in order to improve the following deficits and impairments:   Body Structure / Function / Physical Skills: ADL,Balance,IADL,ROM,Improper spinal/pelvic alignment,Mobility,Tone,UE  functional use,Decreased knowledge of use of DME,Pain,Coordination,FMC,Dexterity,Flexibility (bradykinesia) Cognitive Skills: Memory     Visit Diagnosis: Other symptoms and signs involving the nervous system  Other lack of coordination  Other symptoms and signs involving the musculoskeletal system  Abnormal posture  Unsteadiness on feet  Tremor  Stiffness of left shoulder, not elsewhere classified    Problem List Patient Active Problem List   Diagnosis Date Noted  . Primary renal papillary carcinoma, right ([place]) 02/18/2020  . Hepatic steatosis 02/18/2020  . Major depression in remission ([place]) 11/11/2019  . Central sleep apnea due to Cheyne-Stokes respiration 08/31/2019  . Elevated PSA 08/13/2019  . Hyperlipidemia associated with type 2 diabetes mellitus ([place]) 07/09/2019  . Aortic atherosclerosis ([place]) 06/10/2018  . Controlled type 2 diabetes mellitus with diabetic dermatitis, without long-term current use of insulin ([place]) 01/21/2018  . OSA treated with BiPAP 01/03/2017  . REM sleep behavior disorder 12/06/2016  . CKD stage 3 due to type 2 diabetes mellitus ([place]) 10/23/2016  . Medication management 10/23/2016  . Vitamin D deficiency 10/23/2016  . Parkinson's disease ([place]) 01/04/2016  . Seasonal and perennial allergic rhinitis 06/21/2014  . Asthma, mild intermittent, well-controlled 06/21/2014  . Pseudomonas aeruginosa colonization 10/08/2012  . CAD (coronary artery disease) 11/15/2011  . Hypertension 11/15/2011  . Morbid obesity with BMI of 40.0-44.9, adult [facility]) 11/15/2011    [facility] 07/08/2020, 8:19 AM  [facility] [address] [place], [place], [address] Phone: [phone]   Fax:  [phone]  Name: RYETT HAMMAN MRN: [id number] Date of Birth: 1955/12/04   Vianne Bulls, OTR/L [facility] [address] [place], [place]  [address] [phone]  phone [phone] 07/08/20 8:19 AM

## 2020-07-13 ENCOUNTER — Ambulatory Visit: Payer: Medicare Other | Admitting: Occupational Therapy

## 2020-07-13 ENCOUNTER — Ambulatory Visit: Payer: Medicare Other | Admitting: Physical Therapy

## 2020-07-15 ENCOUNTER — Ambulatory Visit: Payer: Medicare Other | Admitting: Adult Health

## 2020-07-16 ENCOUNTER — Ambulatory Visit: Payer: Medicare Other | Admitting: Occupational Therapy

## 2020-07-16 ENCOUNTER — Ambulatory Visit: Payer: Medicare Other | Admitting: Physical Therapy

## 2020-07-19 ENCOUNTER — Ambulatory Visit: Payer: Medicare Other

## 2020-07-19 ENCOUNTER — Ambulatory Visit: Payer: Medicare Other | Admitting: Occupational Therapy

## 2020-07-19 ENCOUNTER — Other Ambulatory Visit: Payer: Self-pay

## 2020-07-19 DIAGNOSIS — M25512 Pain in left shoulder: Secondary | ICD-10-CM | POA: Diagnosis not present

## 2020-07-19 DIAGNOSIS — M25612 Stiffness of left shoulder, not elsewhere classified: Secondary | ICD-10-CM | POA: Diagnosis not present

## 2020-07-19 DIAGNOSIS — R29898 Other symptoms and signs involving the musculoskeletal system: Secondary | ICD-10-CM

## 2020-07-19 DIAGNOSIS — R29818 Other symptoms and signs involving the nervous system: Secondary | ICD-10-CM

## 2020-07-19 DIAGNOSIS — R293 Abnormal posture: Secondary | ICD-10-CM | POA: Diagnosis not present

## 2020-07-19 DIAGNOSIS — R2681 Unsteadiness on feet: Secondary | ICD-10-CM

## 2020-07-19 DIAGNOSIS — R251 Tremor, unspecified: Secondary | ICD-10-CM

## 2020-07-19 DIAGNOSIS — R278 Other lack of coordination: Secondary | ICD-10-CM

## 2020-07-19 DIAGNOSIS — R2689 Other abnormalities of gait and mobility: Secondary | ICD-10-CM | POA: Diagnosis not present

## 2020-07-19 DIAGNOSIS — G8929 Other chronic pain: Secondary | ICD-10-CM | POA: Diagnosis not present

## 2020-07-19 NOTE — Therapy (Signed)
[facility] [address] [place], [place], [address] Phone: [phone]   Fax:  [phone]  Occupational Therapy Treatment  Patient Details  Name: Todd Rice MRN: [id number] Date of Birth: Apr 27, 1956 Referring Provider (OT): Dr. Margette Fast   Encounter Date: 07/19/2020   OT End of Session - 07/19/20 0724    Visit Number 6    Number of Visits 13    Date for OT Re-Evaluation 07/29/20    Authorization Type UHC [place] 2021, no deductible, VL: [place], no auth    Authorization - Visit Number 6    Authorization - Number of Visits 10    Progress Note Due on Visit 10    OT Start Time [phone]    OT Stop Time 0800    OT Time Calculation (min) 39 min    Activity Tolerance Patient tolerated treatment well    Behavior During Therapy [facility] for tasks assessed/performed           Past Medical History:  Diagnosis Date  . Adult BMI 50.0-59.9 kg/sq m    52.77  . Anal fissure   . Anxiety   . Diabetes mellitus without complication (HCC)    borderline  . GERD (gastroesophageal reflux disease)    30 years ago, maybe from peptic ulcer  . H/O: gout    STABLE  . Hepatic steatosis 10/01/11  . History of kidney stones   . Hyperlipidemia   . Hypertension   . Kidney tumor    right upper kidney tumor, monitoring  . OSA on CPAP    [facility]  . Parkinson disease ([place]) 01/04/2016  . REM sleep behavior disorder 12/06/2016  . Right ureteral stone   . Ureteral calculus, right 06/12/2011  . Vitamin D deficiency   . Weakness     Past Surgical History:  Procedure Laterality Date  . ANAL FISSURE REPAIR  10-12-2000  . COLONOSCOPY N/A 07/01/2012   Procedure: COLONOSCOPY;  Surgeon: Lafayette Dragon, MD;  [facility];  Service: Endoscopy;  Laterality: N/A;  . CYSTOSCOPY WITH RETROGRADE PYELOGRAM, URETEROSCOPY AND STENT PLACEMENT Bilateral 02/26/2013   Procedure: CYSTOSCOPY WITH RETROGRADE PYELOGRAM, URETEROSCOPY AND STENT PLACEMENT;   Surgeon: Alexis Frock, MD;  [facility];  Service: Urology;  Laterality: Bilateral;  . HOLMIUM LASER APPLICATION Bilateral 31/51/7616   Procedure: HOLMIUM LASER APPLICATION;  Surgeon: Alexis Frock, MD;  [facility];  Service: Urology;  Laterality: Bilateral;  . kidney stone removal     06/2011-stent placement  . LEFT MEDIAL FEMORAL CONDYLE DEBRIDEMENT & DRILLING/ REMOVAL LOOSE BODY  09-15-2003  . NASAL SEPTUM SURGERY  1985  . STONE EXTRACTION WITH BASKET  06/12/2011   Procedure: STONE EXTRACTION WITH BASKET;  Surgeon: Claybon Jabs, MD;  Location: [facility];  Service: Urology;  Laterality: Right;    There were no vitals filed for this visit.   Subjective Assessment - 07/19/20 0723    Subjective  Pt reports no back pain today.  Pt reports that he walked a lot yesterday.    Pertinent History Parkinson's Disease.  PMH:CAD, HTN, HDL, obesity, asthma, DM, stage 3 CKD, vitamin D deficiency, REM sleep disorder, OSA, aortic atherosclerosis, hx of gout, GERD, hx of anxiety    Patient Stated Goals improve use of hands    Currently in Pain? No/denies    Pain Onset In the past 7 days            Supine PWR! Up, rock, twist with min  cueing for incr movement amplitude  (hands) x20 each  Sitting, floor>ceiling stretch with BUEs with ball followed by diagonals to each side x10 with min v.c. for incr movement amplitude.  Standing, tossing/catching medium ball with BUEs with focus on finger/elbow ext with min v.c.    Review from coordination HEP:    -Rotating ball in fingertips with each hand with min cueing/difficulty for large movements.    -Flipping card between each finger with min cueing/difficulty each hand  -Picking up and stacking coins with each hand with min difficulty then moving from palm>fingertips to place in coin bank with each hand.  -Verbally reviewed other exercises today--see pt instructions for list.  Pt donned jacket at end of session using large  amplitude movements and zipped without significant difficulty or cueing (during conversation/dual task)--demo good carryover/calibration.       OT Education - 07/19/20 0755    Education Details PWR! hands and Coordination HEP--see pt instructions    Person(s) Educated Patient    Methods Explanation;Demonstration;Verbal cues;Handout    Comprehension Verbalized understanding;Returned demonstration;Verbal cues required            OT Short Term Goals - 07/08/20 0819      OT SHORT TERM GOAL #1   Title Pt will be independent with updated PD-specific HEP--check STGs 07/12/20    Time 4    Period Weeks    Status New      OT SHORT TERM GOAL #2   Title Pt will increase bilateral standing functional reach to 10 inches or greater bilaterally for decreased fall risk during ADLs/IADLs.    Baseline R-8", L-9"    Time 4    Period Weeks    Status New      OT SHORT TERM GOAL #3   Title Pt will demonstrate improved RUE coordination and reach for ADLs as evidenced by increasing box and blocks score to 42 blocks    Baseline RUE 38, LUE 48    Time 4    Period Weeks    Status New      OT SHORT TERM GOAL #4   Title Pt will be able to wrist at least 3 sentences with 100% legibility and only min decr in size.    Baseline --    Time 4    Period Weeks    Status Achieved   07/08/20     OT SHORT TERM GOAL #5   Title --             OT Long Term Goals - 06/14/20 1258      OT LONG TERM GOAL #1   Title Pt will verbalize understanding of updated strategies for ADLs to incr ease/safety and decr risk of future complications (including positioning of L shoulder).--check LTGs 07/29/20    Time 6    Period Weeks    Status New      OT LONG TERM GOAL #2   Title Pt will demo at least 145* L shoulder flex with 2/10 pain or less and -15* elbow ext for functional reach.    Baseline 140* with -20* elbow ext    Time 6    Period Weeks    Status New      OT LONG TERM GOAL #3   Title Pt will improve R  hand coordination for ADLs as shown by improving time on 9-hole peg test 4 sec.    Baseline 41.16sec    Time 6    Period Weeks  Status New      OT LONG TERM GOAL #4   Title Pt will improve bilateral hand coordination for ADLs as shown by fastening/unfastening 3 buttons in 44sec or less.    Baseline 48.35sec    Time 6    Period Weeks    Status New      OT LONG TERM GOAL #5   Title --                 Plan - 07/19/20 0725    Clinical Impression Statement Pt is progressing towards goals with improving coordination and movement amplitude with improving carryover to activities when not cued.    OT Occupational Profile and History Detailed Assessment- Review of Records and additional review of physical, cognitive, psychosocial history related to current functional performance    Occupational performance deficits (Please refer to evaluation for details): ADL's;IADL's;Leisure    Body Structure / Function / Physical Skills ADL;Balance;IADL;ROM;Improper spinal/pelvic alignment;Mobility;Tone;UE functional use;Decreased knowledge of use of DME;Pain;Coordination;FMC;Dexterity;Flexibility   bradykinesia   Cognitive Skills Memory    Rehab Potential Good    Clinical Decision Making Several treatment options, min-mod task modification necessary    Comorbidities Affecting Occupational Performance: May have comorbidities impacting occupational performance    Modification or Assistance to Complete Evaluation  Min-Moderate modification of tasks or assist with assess necessary to complete eval    OT Frequency 2x / week    OT Duration 6 weeks   +eval or 13 visits total   OT Treatment/Interventions Self-care/ADL training;DME and/or AE instruction;Balance training;Therapeutic activities;Aquatic Therapy;Therapeutic exercise;Cognitive remediation/compensation;Passive range of motion;Functional Mobility Training;Neuromuscular education;Manual Therapy;Patient/family education;Moist  Heat;Cryotherapy;Ultrasound;Energy conservation;Electrical Stimulation;Fluidtherapy;Paraffin    Plan coordination/functional reach, bilateral hand coordination, timing, ADL strategies    Consulted and Agree with Plan of Care Patient           Patient will benefit from skilled therapeutic intervention in order to improve the following deficits and impairments:   Body Structure / Function / Physical Skills: ADL,Balance,IADL,ROM,Improper spinal/pelvic alignment,Mobility,Tone,UE functional use,Decreased knowledge of use of DME,Pain,Coordination,FMC,Dexterity,Flexibility (bradykinesia) Cognitive Skills: Memory     Visit Diagnosis: Other symptoms and signs involving the nervous system  Other lack of coordination  Other symptoms and signs involving the musculoskeletal system  Abnormal posture  Unsteadiness on feet  Tremor  Stiffness of left shoulder, not elsewhere classified    Problem List Patient Active Problem List   Diagnosis Date Noted  . Primary renal papillary carcinoma, right ([place]) 02/18/2020  . Hepatic steatosis 02/18/2020  . Major depression in remission ([place]) 11/11/2019  . Central sleep apnea due to Cheyne-Stokes respiration 08/31/2019  . Elevated PSA 08/13/2019  . Hyperlipidemia associated with type 2 diabetes mellitus ([place]) 07/09/2019  . Aortic atherosclerosis ([place]) 06/10/2018  . Controlled type 2 diabetes mellitus with diabetic dermatitis, without long-term current use of insulin ([place]) 01/21/2018  . OSA treated with BiPAP 01/03/2017  . REM sleep behavior disorder 12/06/2016  . CKD stage 3 due to type 2 diabetes mellitus ([place]) 10/23/2016  . Medication management 10/23/2016  . Vitamin D deficiency 10/23/2016  . Parkinson's disease ([place]) 01/04/2016  . Seasonal and perennial allergic rhinitis 06/21/2014  . Asthma, mild intermittent, well-controlled 06/21/2014  . Pseudomonas aeruginosa colonization 10/08/2012  . CAD (coronary artery disease) 11/15/2011  .  Hypertension 11/15/2011  . Morbid obesity with BMI of 40.0-44.9, adult ([place]) 11/15/2011    [facility] 07/19/2020, 8:07 AM  [facility] [address] [place], [place], [address] Phone: [phone]   Fax:  [phone]  Name: Todd Rice MRN: [id number] Date of Birth: 1955-07-19   Vianne Bulls, OTR/L [facility] [address] [place], [place]  [address] [phone] phone [phone] 07/19/20 8:07 AM

## 2020-07-19 NOTE — Patient Instructions (Addendum)
PWR! Hand Exercises  Then, start with elbows bent and hands closed:  PWR! Hands: Push hands out BIG. Elbows straight, wrists up, fingers open and spread apart BIG.   PWR! Step: Touch index finger to thumb while keeping other fingers straight. Flick fingers out BIG (thumb out/straighten fingers). Repeat with other fingers. (Step your thumb to each finger).  With arms stretched out in front of you (elbows straight), perform the following:  PWR! Rock:  Move wrists up and down [employer]! Twist: Twist palms up and down BIG  ** Make each movement big and deliberate so that you feel the movement.  Perform at least 10 repetitions 1x/day, but perform PWR! Hands throughout the day when you are having trouble using your hands (picking up/manipulating small objects, writing, eating, typing, sewing, buttoning, etc.).     Coordination Exercises  Perform the following exercises for 20 minutes 1 times per day. Perform with both hand(s). Perform using big movements.   Flipping Cards: Place deck of cards on the table. Flip cards over by opening your hand big to grasp and then turn your palm up big, opening hand fully to release.  Deal cards: Hold 1/2 or whole deck in your hand. Use thumb to push card off top of deck with one big push.  Flip card between each finger.  Place card on tabletop. Then flick fingers (extend fingers) powerfully to slide card off table (can have chair/box below table to catch the cards).  Rotate ball with fingertips: Pick up with fingers/thumb and move as much as you can with each turn/movement (clockwise and counter-clockwise).  Toss ball from one hand to the other: Toss big/high.  Deliberately open with toss and deliberately close hand after catch.  Toss ball in the air and catch with the same hand: Toss big/high.  Deliberately open with toss and deliberately close hand after catch.  Juggle 2 balls: Do not go fast. Pause after each toss.  Rotate 2 golf balls in your  hand: Both directions.  Pick up coins and stack one at a time: Open hand and Pick up with big, intentional movements. Do not drag coin to the edge. (5-10 in a stack)  Pick up 5-10 coins one at a time and hold in palm. Then, move coins from palm to fingertips one at time and place in coin bank/container.  Open hand big and pick up coins with each finger and thumb  Practice writing: Slow down, write big, and focus on forming each letter.

## 2020-07-22 ENCOUNTER — Encounter: Payer: Self-pay | Admitting: Occupational Therapy

## 2020-07-22 ENCOUNTER — Encounter: Payer: Self-pay | Admitting: Physical Therapy

## 2020-07-22 ENCOUNTER — Other Ambulatory Visit: Payer: Self-pay

## 2020-07-22 ENCOUNTER — Ambulatory Visit: Payer: Medicare Other | Admitting: Occupational Therapy

## 2020-07-22 ENCOUNTER — Ambulatory Visit: Payer: Medicare Other | Admitting: Physical Therapy

## 2020-07-22 DIAGNOSIS — G8929 Other chronic pain: Secondary | ICD-10-CM

## 2020-07-22 DIAGNOSIS — R278 Other lack of coordination: Secondary | ICD-10-CM

## 2020-07-22 DIAGNOSIS — R29818 Other symptoms and signs involving the nervous system: Secondary | ICD-10-CM

## 2020-07-22 DIAGNOSIS — R2689 Other abnormalities of gait and mobility: Secondary | ICD-10-CM | POA: Diagnosis not present

## 2020-07-22 DIAGNOSIS — M25512 Pain in left shoulder: Secondary | ICD-10-CM | POA: Diagnosis not present

## 2020-07-22 DIAGNOSIS — R29898 Other symptoms and signs involving the musculoskeletal system: Secondary | ICD-10-CM | POA: Diagnosis not present

## 2020-07-22 DIAGNOSIS — M25612 Stiffness of left shoulder, not elsewhere classified: Secondary | ICD-10-CM

## 2020-07-22 DIAGNOSIS — R293 Abnormal posture: Secondary | ICD-10-CM | POA: Diagnosis not present

## 2020-07-22 DIAGNOSIS — R251 Tremor, unspecified: Secondary | ICD-10-CM | POA: Diagnosis not present

## 2020-07-22 DIAGNOSIS — R2681 Unsteadiness on feet: Secondary | ICD-10-CM

## 2020-07-22 NOTE — Therapy (Signed)
[facility] [address] [place], [place], [address] Phone: [phone]   Fax:  [phone]  Physical Therapy Treatment  Patient Details  Name: Todd Rice MRN: [id number] Date of Birth: 1956-03-07 Referring Provider (PT): [name]   Encounter Date: 07/22/2020   PT End of Session - 07/22/20 0855    Visit Number 6    Number of Visits 13    Date for PT Re-Evaluation 08/14/20    Authorization Type UHC Medicare-PT/ST combined $30 copay; OT separate $30 copay    Progress Note Due on Visit 10    PT Start Time 0852    PT Stop Time 0931    PT Time Calculation (min) 39 min    Activity Tolerance Patient tolerated treatment well    Behavior During Therapy [facility] for tasks assessed/performed           Past Medical History:  Diagnosis Date  . Adult BMI 50.0-59.9 kg/sq m    52.77  . Anal fissure   . Anxiety   . Diabetes mellitus without complication (HCC)    borderline  . GERD (gastroesophageal reflux disease)    30 years ago, maybe from peptic ulcer  . H/O: gout    STABLE  . Hepatic steatosis 10/01/11  . History of kidney stones   . Hyperlipidemia   . Hypertension   . Kidney tumor    right upper kidney tumor, monitoring  . OSA on CPAP    [facility]  . Parkinson disease ([place]) 01/04/2016  . REM sleep behavior disorder 12/06/2016  . Right ureteral stone   . Ureteral calculus, right 06/12/2011  . Vitamin D deficiency   . Weakness     Past Surgical History:  Procedure Laterality Date  . ANAL FISSURE REPAIR  10-12-2000  . COLONOSCOPY N/A 07/01/2012   Procedure: COLONOSCOPY;  Surgeon: Lafayette Dragon, MD;  Location: [facility];  Service: Endoscopy;  Laterality: N/A;  . CYSTOSCOPY WITH RETROGRADE PYELOGRAM, URETEROSCOPY AND STENT PLACEMENT Bilateral 02/26/2013   Procedure: CYSTOSCOPY WITH RETROGRADE PYELOGRAM, URETEROSCOPY AND STENT PLACEMENT;  Surgeon: Alexis Frock, MD;  Location: [facility];  Service: Urology;   Laterality: Bilateral;  . HOLMIUM LASER APPLICATION Bilateral 24/58/0998   Procedure: HOLMIUM LASER APPLICATION;  Surgeon: Alexis Frock, MD;  Location: [facility];  Service: Urology;  Laterality: Bilateral;  . kidney stone removal     06/2011-stent placement  . LEFT MEDIAL FEMORAL CONDYLE DEBRIDEMENT & DRILLING/ REMOVAL LOOSE BODY  09-15-2003  . NASAL SEPTUM SURGERY  1985  . STONE EXTRACTION WITH BASKET  06/12/2011   Procedure: STONE EXTRACTION WITH BASKET;  Surgeon: Claybon Jabs, MD;  Location: [facility];  Service: Urology;  Laterality: Right;    There were no vitals filed for this visit.   Subjective Assessment - 07/22/20 0853    Subjective Jerked too quickly a little over a week ago, due to a dog rushing to me.  Caused my back some trouble, but it's better now.    Patient Stated Goals Pt's goals for physical therapy are to want to make sure things continue to go well with walking and balance.    Currently in Pain? No/denies    Pain Onset In the past 7 days                             [facility] Adult PT Treatment/Exercise - 07/22/20 0001      Ambulation/Gait   Ambulation/Gait  Yes    Ambulation/Gait Assistance 6: Modified independent (Device/Increase time)    Ambulation Distance (Feet) 345 Feet    Assistive device None    Gait Pattern Step-through pattern;Decreased arm swing - right;Decreased arm swing - left;Decreased step length - right;Decreased trunk rotation    Ambulation Surface Level;Indoor      High Level Balance   High Level Balance Activities Marching forwards;Other (comment)    High Level Balance Comments Dynamic balance activities:  forward marching with coordinated opposite arm lift, 20 ft x 4 reps, then forward march with opposite hand taps to knee 20 ft x 2 reps, then forward lunge steps 20 ft x 2, forward BIG step and stop with coordinated arms,transitioning to large step length, coordinated arm swing x 110 ft          With gait, cues  for increased arm swing and step length; pt responds well to cues to focus on shoulder extension for more relaxed arm swing in forward direciton.     Balance Exercises - 07/22/20 0001      Balance Exercises: Standing   Standing Eyes Opened Wide (BOA);Narrow base of support (BOS);Foam/compliant surface;5 reps;Head turns   Head nods   Standing Eyes Closed Wide (BOA);Narrow base of support (BOS);Foam/compliant surface;Head turns;5 reps   Head nods   SLS Eyes open;Solid surface;Intermittent upper extremity support;3 reps;10 secs    SLS with Vectors Upper extremity assist 1;Limitations;Foam/compliant surface    SLS with Vectors Limitations Alt step taps to 12" step, x 15reps, step taps to 12" step, then back to runner's stretch position, x 15 reps each leg (standing on Airex), 1 UE support as needed    Wall Bumps Hip;Eyes opened;10 reps;Eyes closed   2nd set 10 reps EC   Partial Tandem Stance Eyes open;Foam/compliant surface;5 reps;Eyes closed;Limitations    Partial Tandem Stance Limitations On Airex, EO head turns/nods x 5, then EC 10 sec head steady.    Heel Raises Both;10 reps   On Airex; 2nd set EC   Toe Raise Both;10 reps   On Airex, 2nd set EC   Other Standing Exercises Resisted Sidestep/together at counter with green band, no support, 10 reps each direction; then sidestep/together squat x 10 reps with resistance               PT Short Term Goals - 06/15/20 0741      PT SHORT TERM GOAL #1   Title Pt will be independent with HEP for improved balance, transfers, gait.  TARGET 07/16/2020    Time 4    Period Weeks    Status New      PT SHORT TERM GOAL #2   Title Pt will improve 5x sit<>stand score to less than or equal to 11.5 sec for improved transfer efficiency and functional lower extremity strength.    Time 4    Period Weeks    Status New      PT SHORT TERM GOAL #3   Title Pt will improve TUG and TUG cognitive score to less than or equal to 10% difference for improved ability  with dual tasking with gait.    Baseline 13.09, 15.25    Time 4    Period Weeks    Status New             PT Long Term Goals - 06/15/20 0743      PT LONG TERM GOAL #1   Title Pt will independent with progression of HEP to address Parkinson's deficits for  improved strength, balance and gait.  TARGET 07/30/2020    Time 6    Period Weeks    Status New      PT LONG TERM GOAL #2   Title Pt will perform 8 of 10 reps of sit<>stand from <18" surface, no UE support, no LOB, for improved functional strength and transfer efficiency.    Time 6    Period Weeks    Status New      PT LONG TERM GOAL #3   Title Pt will improve MiniBESTest to at least 25/28 for decreased fall risk.    Baseline 23/28    Time 6    Period Weeks    Status New      PT LONG TERM GOAL #4   Title Pt will improve gait velocity to at least 3 ft/sec for improved gait efficiency and safety.    Baseline 2.81 ft/sec    Time 6    Period Weeks    Status New      PT LONG TERM GOAL #5   Title Pt will ambulate at least 1000 ft, indoor and outdoor surfaces, independently, no LOB for improved community gait.    Time 6    Period Weeks    Status New                 Plan - 07/22/20 1339    Clinical Impression Statement Pt seen only once this week due to primary therapist being out earlier this week at pt's first visit.  Skilled PT session today focused on statice, dynamic and SLS balance on solid and compliant surfaces.  He has improved to intermittent UE support with single limb stance activities on solid surfaces, still needs UE support while on compliant surfaces.  Difficulty coordinating UE and lower extremity activities for dynamic balance.  He will continue to benefit from skilled PT to further address balance and gait towards goals.    Personal Factors and Comorbidities Comorbidity 3+    Comorbidities See PMH list    Examination-Activity Limitations Locomotion Level;Transfers;Stairs;Stand     Examination-Participation Restrictions Community Activity;Other   walking dog in neighborhood   Stability/Clinical Decision Making Evolving/Moderate complexity    Rehab Potential Good    PT Frequency 2x / week    PT Duration 6 weeks   plus eval   PT Treatment/Interventions ADLs/Self Care Home Management;Neuromuscular re-education;Balance training;Therapeutic exercise;Therapeutic activities;Functional mobility training;Stair training;Gait training;Patient/family education    PT Next Visit Plan Check STGs next week; PWR! Moves standing FLOW, compliant surfaces; continue standing balance exercises and functional strengthening; gait outdoors with walking poles    Consulted and Agree with Plan of Care Patient           Patient will benefit from skilled therapeutic intervention in order to improve the following deficits and impairments:  Abnormal gait,Difficulty walking,Impaired tone,Decreased balance,Decreased mobility,Decreased strength,Postural dysfunction  Visit Diagnosis: Other abnormalities of gait and mobility  Unsteadiness on feet  Other symptoms and signs involving the nervous system     Problem List Patient Active Problem List   Diagnosis Date Noted  . Primary renal papillary carcinoma, right ([place]) 02/18/2020  . Hepatic steatosis 02/18/2020  . Major depression in remission ([place]) 11/11/2019  . Central sleep apnea due to Cheyne-Stokes respiration 08/31/2019  . Elevated PSA 08/13/2019  . Hyperlipidemia associated with type 2 diabetes mellitus ([place]) 07/09/2019  . Aortic atherosclerosis ([place]) 06/10/2018  . Controlled type 2 diabetes mellitus with diabetic dermatitis, without long-term current use of insulin ([place]) 01/21/2018  .  OSA treated with BiPAP 01/03/2017  . REM sleep behavior disorder 12/06/2016  . CKD stage 3 due to type 2 diabetes mellitus ([place]) 10/23/2016  . Medication management 10/23/2016  . Vitamin D deficiency 10/23/2016  . Parkinson's disease ([place]) 01/04/2016  .  Seasonal and perennial allergic rhinitis 06/21/2014  . Asthma, mild intermittent, well-controlled 06/21/2014  . Pseudomonas aeruginosa colonization 10/08/2012  . CAD (coronary artery disease) 11/15/2011  . Hypertension 11/15/2011  . Morbid obesity with BMI of 40.0-44.9, adult ([place]) 11/15/2011    Kharter Sestak W. 07/22/2020, 1:44 PM  Frazier Butt., PT   [facility] [address] [place], [place], [address] Phone: [phone]   Fax:  [phone]  Name: SKILER OLDEN MRN: [id number] Date of Birth: [birth year]

## 2020-07-22 NOTE — Therapy (Signed)
[facility] [address] [place], [place], [address] Phone: [phone]   Fax:  [phone]  Occupational Therapy Treatment  Patient Details  Name: Todd Rice MRN: [id number] Date of Birth: 1955-08-26 Referring Provider (OT): Dr. Margette Fast   Encounter Date: 07/22/2020   OT End of Session - 07/22/20 0817    Visit Number 7    Number of Visits 13    Date for OT Re-Evaluation 07/29/20    Authorization Type [employer] 2021, no deductible, VL: [place], no auth    Authorization - Visit Number 7    Authorization - Number of Visits 10    Progress Note Due on Visit 10    OT Start Time 0806    OT Stop Time 0845    OT Time Calculation (min) 39 min    Activity Tolerance Patient tolerated treatment well    Behavior During Therapy [facility] for tasks assessed/performed           Past Medical History:  Diagnosis Date  . Adult BMI 50.0-59.9 kg/sq m    52.77  . Anal fissure   . Anxiety   . Diabetes mellitus without complication (HCC)    borderline  . GERD (gastroesophageal reflux disease)    30 years ago, maybe from peptic ulcer  . H/O: gout    STABLE  . Hepatic steatosis 10/01/11  . History of kidney stones   . Hyperlipidemia   . Hypertension   . Kidney tumor    right upper kidney tumor, monitoring  . OSA on CPAP    [facility]  . Parkinson disease ([place]) 01/04/2016  . REM sleep behavior disorder 12/06/2016  . Right ureteral stone   . Ureteral calculus, right 06/12/2011  . Vitamin D deficiency   . Weakness     Past Surgical History:  Procedure Laterality Date  . ANAL FISSURE REPAIR  10-12-2000  . COLONOSCOPY N/A 07/01/2012   Procedure: COLONOSCOPY;  Surgeon: Lafayette Dragon, MD;  [facility];  Service: Endoscopy;  Laterality: N/A;  . CYSTOSCOPY WITH RETROGRADE PYELOGRAM, URETEROSCOPY AND STENT PLACEMENT Bilateral 02/26/2013   Procedure: CYSTOSCOPY WITH RETROGRADE PYELOGRAM, URETEROSCOPY AND STENT PLACEMENT;   Surgeon: Alexis Frock, MD;  [facility];  Service: Urology;  Laterality: Bilateral;  . HOLMIUM LASER APPLICATION Bilateral 27/74/1287   Procedure: HOLMIUM LASER APPLICATION;  Surgeon: Alexis Frock, MD;  [facility];  Service: Urology;  Laterality: Bilateral;  . kidney stone removal     06/2011-stent placement  . LEFT MEDIAL FEMORAL CONDYLE DEBRIDEMENT & DRILLING/ REMOVAL LOOSE BODY  09-15-2003  . NASAL SEPTUM SURGERY  1985  . STONE EXTRACTION WITH BASKET  06/12/2011   Procedure: STONE EXTRACTION WITH BASKET;  Surgeon: Claybon Jabs, MD;  Location: [facility];  Service: Urology;  Laterality: Right;    There were no vitals filed for this visit.   Subjective Assessment - 07/22/20 0816    Subjective  My left hand doesn't shake at home, only when I'm here.    Pertinent History Parkinson's Disease.  PMH:CAD, HTN, HDL, obesity, asthma, DM, stage 3 CKD, vitamin D deficiency, REM sleep disorder, OSA, aortic atherosclerosis, hx of gout, GERD, hx of anxiety    Patient Stated Goals improve use of hands    Currently in Pain? No/denies    Pain Onset In the past 7 days           Reviewed activities from coordination HEP:  Tossing/catching ball with each hand, between  hands, and juggling 2 balls with min cueing for timing and deliberate movements.  Rotating 2 golf balls in each hand with mod-max difficulty and min cueing for movements.  Rotating ball in each hand/fingertips with min cueing for incr movement amplitude.  Stacking coins and moving coins in hand to place in coin bank with min cueing for incr movement amplitude particularly with R hand.   Pulling bag into each hand to simulate donning socks/clothing adjustment with min cueing/difficulty L hand, mod difficulty and hand to assist by placing hand on leg for R hand.  Checked box and blocks test with RUE--improved to 47 blocks (improved by 9)       OT Education - 07/22/20 1350    Education Details Coordination  HEP review.    Person(s) Educated Patient    Methods Explanation;Demonstration;Verbal cues    Comprehension Verbalized understanding;Returned demonstration;Verbal cues required            OT Short Term Goals - 07/22/20 0818      OT SHORT TERM GOAL #1   Title Pt will be independent with updated PD-specific HEP--check STGs 07/12/20    Time 4    Period Weeks    Status Achieved      OT SHORT TERM GOAL #2   Title Pt will increase bilateral standing functional reach to 10 inches or greater bilaterally for decreased fall risk during ADLs/IADLs.    Baseline R-8", L-9"    Time 4    Period Weeks    Status On-going      OT SHORT TERM GOAL #3   Title Pt will demonstrate improved RUE coordination and reach for ADLs as evidenced by increasing box and blocks score to 42 blocks    Baseline RUE 38, LUE 48    Time 4    Period Weeks    Status Achieved   07/22/20:  R-47 blocks     OT SHORT TERM GOAL #4   Title Pt will be able to wrist at least 3 sentences with 100% legibility and only min decr in size.    Baseline --    Time 4    Period Weeks    Status Achieved   07/08/20     OT SHORT TERM GOAL #5   Title --             OT Long Term Goals - 06/14/20 1258      OT LONG TERM GOAL #1   Title Pt will verbalize understanding of updated strategies for ADLs to incr ease/safety and decr risk of future complications (including positioning of L shoulder).--check LTGs 07/29/20    Time 6    Period Weeks    Status New      OT LONG TERM GOAL #2   Title Pt will demo at least 145* L shoulder flex with 2/10 pain or less and -15* elbow ext for functional reach.    Baseline 140* with -20* elbow ext    Time 6    Period Weeks    Status New      OT LONG TERM GOAL #3   Title Pt will improve R hand coordination for ADLs as shown by improving time on 9-hole peg test 4 sec.    Baseline 41.16sec    Time 6    Period Weeks    Status New      OT LONG TERM GOAL #4   Title Pt will improve bilateral hand  coordination for ADLs as shown by fastening/unfastening 3  buttons in 44sec or less.    Baseline 48.35sec    Time 6    Period Weeks    Status New      OT LONG TERM GOAL #5   Title --                 Plan - 07/22/20 0817    Clinical Impression Statement Pt is progressing towards goals with improving coordination and movement amplitude, but continues to have bradykinesia with higher level coordination tasks.    OT Occupational Profile and History Detailed Assessment- Review of Records and additional review of physical, cognitive, psychosocial history related to current functional performance    Occupational performance deficits (Please refer to evaluation for details): ADL's;IADL's;Leisure    Body Structure / Function / Physical Skills ADL;Balance;IADL;ROM;Improper spinal/pelvic alignment;Mobility;Tone;UE functional use;Decreased knowledge of use of DME;Pain;Coordination;FMC;Dexterity;Flexibility   bradykinesia   Cognitive Skills Memory    Rehab Potential Good    Clinical Decision Making Several treatment options, min-mod task modification necessary    Comorbidities Affecting Occupational Performance: May have comorbidities impacting occupational performance    Modification or Assistance to Complete Evaluation  Min-Moderate modification of tasks or assist with assess necessary to complete eval    OT Frequency 2x / week    OT Duration 6 weeks   +eval or 13 visits total   OT Treatment/Interventions Self-care/ADL training;DME and/or AE instruction;Balance training;Therapeutic activities;Aquatic Therapy;Therapeutic exercise;Cognitive remediation/compensation;Passive range of motion;Functional Mobility Training;Neuromuscular education;Manual Therapy;Patient/family education;Moist Heat;Cryotherapy;Ultrasound;Energy conservation;Electrical Stimulation;Fluidtherapy;Paraffin    Plan check remaining STGs, coordination/functional reach, bilateral hand coordination, timing, ADL strategies     Consulted and Agree with Plan of Care Patient           Patient will benefit from skilled therapeutic intervention in order to improve the following deficits and impairments:   Body Structure / Function / Physical Skills: ADL,Balance,IADL,ROM,Improper spinal/pelvic alignment,Mobility,Tone,UE functional use,Decreased knowledge of use of DME,Pain,Coordination,FMC,Dexterity,Flexibility (bradykinesia) Cognitive Skills: Memory     Visit Diagnosis: Other symptoms and signs involving the nervous system  Other lack of coordination  Other symptoms and signs involving the musculoskeletal system  Abnormal posture  Unsteadiness on feet  Tremor  Stiffness of left shoulder, not elsewhere classified  Other abnormalities of gait and mobility  Chronic left shoulder pain    Problem List Patient Active Problem List   Diagnosis Date Noted  . Primary renal papillary carcinoma, right ([place]) 02/18/2020  . Hepatic steatosis 02/18/2020  . Major depression in remission ([place]) 11/11/2019  . Central sleep apnea due to Cheyne-Stokes respiration 08/31/2019  . Elevated PSA 08/13/2019  . Hyperlipidemia associated with type 2 diabetes mellitus ([place]) 07/09/2019  . Aortic atherosclerosis ([place]) 06/10/2018  . Controlled type 2 diabetes mellitus with diabetic dermatitis, without long-term current use of insulin ([place]) 01/21/2018  . OSA treated with BiPAP 01/03/2017  . REM sleep behavior disorder 12/06/2016  . CKD stage 3 due to type 2 diabetes mellitus ([place]) 10/23/2016  . Medication management 10/23/2016  . Vitamin D deficiency 10/23/2016  . Parkinson's disease ([place]) 01/04/2016  . Seasonal and perennial allergic rhinitis 06/21/2014  . Asthma, mild intermittent, well-controlled 06/21/2014  . Pseudomonas aeruginosa colonization 10/08/2012  . CAD (coronary artery disease) 11/15/2011  . Hypertension 11/15/2011  . Morbid obesity with BMI of 40.0-44.9, adult [facility]) 11/15/2011    [facility] 07/22/2020,  1:53 PM  [facility] [address] [place], [place], [address] Phone: [phone]   Fax:  [phone]  Name: Todd Rice MRN: [id number] Date of Birth:  1955-08-15   Vianne Bulls, OTR/L [facility] [address] [place], [place]  [address] [phone] phone [phone] 07/22/20 1:53 PM

## 2020-07-27 ENCOUNTER — Ambulatory Visit: Payer: Medicare Other | Admitting: Occupational Therapy

## 2020-07-27 ENCOUNTER — Encounter: Payer: Self-pay | Admitting: Physical Therapy

## 2020-07-27 ENCOUNTER — Ambulatory Visit: Payer: Medicare Other | Admitting: Physical Therapy

## 2020-07-27 ENCOUNTER — Other Ambulatory Visit: Payer: Self-pay

## 2020-07-27 ENCOUNTER — Encounter: Payer: Self-pay | Admitting: Occupational Therapy

## 2020-07-27 DIAGNOSIS — R2681 Unsteadiness on feet: Secondary | ICD-10-CM | POA: Diagnosis not present

## 2020-07-27 DIAGNOSIS — R251 Tremor, unspecified: Secondary | ICD-10-CM | POA: Diagnosis not present

## 2020-07-27 DIAGNOSIS — M25612 Stiffness of left shoulder, not elsewhere classified: Secondary | ICD-10-CM | POA: Diagnosis not present

## 2020-07-27 DIAGNOSIS — R29818 Other symptoms and signs involving the nervous system: Secondary | ICD-10-CM

## 2020-07-27 DIAGNOSIS — G8929 Other chronic pain: Secondary | ICD-10-CM | POA: Diagnosis not present

## 2020-07-27 DIAGNOSIS — G4733 Obstructive sleep apnea (adult) (pediatric): Secondary | ICD-10-CM | POA: Diagnosis not present

## 2020-07-27 DIAGNOSIS — M25512 Pain in left shoulder: Secondary | ICD-10-CM | POA: Diagnosis not present

## 2020-07-27 DIAGNOSIS — R2689 Other abnormalities of gait and mobility: Secondary | ICD-10-CM | POA: Diagnosis not present

## 2020-07-27 DIAGNOSIS — R293 Abnormal posture: Secondary | ICD-10-CM | POA: Diagnosis not present

## 2020-07-27 DIAGNOSIS — R29898 Other symptoms and signs involving the musculoskeletal system: Secondary | ICD-10-CM

## 2020-07-27 DIAGNOSIS — R278 Other lack of coordination: Secondary | ICD-10-CM | POA: Diagnosis not present

## 2020-07-27 NOTE — Therapy (Signed)
[facility] [address] [place], [place], [address] Phone: [phone]   Fax:  [phone]  Physical Therapy Treatment  Patient Details  Name: Todd Rice MRN: [id number] Date of Birth: Jul 16, 1955 Referring Provider (PT): [name]   Encounter Date: 07/27/2020   PT End of Session - 07/27/20 0718    Visit Number 7    Number of Visits 13    Date for PT Re-Evaluation 08/14/20    Authorization Type UHC Medicare-PT/ST combined $30 copay; OT separate $30 copay    Progress Note Due on Visit 10    PT Start Time 0716    PT Stop Time 0759    PT Time Calculation (min) 43 min    Activity Tolerance Patient tolerated treatment well    Behavior During Therapy [facility] for tasks assessed/performed           Past Medical History:  Diagnosis Date  . Adult BMI 50.0-59.9 kg/sq m    52.77  . Anal fissure   . Anxiety   . Diabetes mellitus without complication (HCC)    borderline  . GERD (gastroesophageal reflux disease)    30 years ago, maybe from peptic ulcer  . H/O: gout    STABLE  . Hepatic steatosis 10/01/11  . History of kidney stones   . Hyperlipidemia   . Hypertension   . Kidney tumor    right upper kidney tumor, monitoring  . OSA on CPAP    [facility]  . Parkinson disease ([place]) 01/04/2016  . REM sleep behavior disorder 12/06/2016  . Right ureteral stone   . Ureteral calculus, right 06/12/2011  . Vitamin D deficiency   . Weakness     Past Surgical History:  Procedure Laterality Date  . ANAL FISSURE REPAIR  10-12-2000  . COLONOSCOPY N/A 07/01/2012   Procedure: COLONOSCOPY;  Surgeon: Lafayette Dragon, MD;  [facility]: [facility];  Service: Endoscopy;  Laterality: N/A;  . CYSTOSCOPY WITH RETROGRADE PYELOGRAM, URETEROSCOPY AND STENT PLACEMENT Bilateral 02/26/2013   Procedure: CYSTOSCOPY WITH RETROGRADE PYELOGRAM, URETEROSCOPY AND STENT PLACEMENT;  Surgeon: Alexis Frock, MD;  [facility]: [facility];  Service: Urology;   Laterality: Bilateral;  . HOLMIUM LASER APPLICATION Bilateral 19/62/2297   Procedure: HOLMIUM LASER APPLICATION;  Surgeon: Alexis Frock, MD;  Location: [facility];  Service: Urology;  Laterality: Bilateral;  . kidney stone removal     06/2011-stent placement  . LEFT MEDIAL FEMORAL CONDYLE DEBRIDEMENT & DRILLING/ REMOVAL LOOSE BODY  09-15-2003  . NASAL SEPTUM SURGERY  1985  . STONE EXTRACTION WITH BASKET  06/12/2011   Procedure: STONE EXTRACTION WITH BASKET;  Surgeon: Claybon Jabs, MD;  Location: [facility];  Service: Urology;  Laterality: Right;    There were no vitals filed for this visit.   Subjective Assessment - 07/27/20 0717    Subjective Nothing new going on.  Haven't been doing too much.    Patient Stated Goals Pt's goals for physical therapy are to want to make sure things continue to go well with walking and balance.    Currently in Pain? No/denies    Pain Onset In the past 7 days                             [facility] Adult PT Treatment/Exercise - 07/27/20 0001      Transfers   Transfers Sit to Stand;Stand to Sit    Sit to Stand 6: Modified independent (Device/Increase time)  Five time sit to stand comments  11.25    Stand to Sit 6: Modified independent (Device/Increase time);Without upper extremity assist;To chair/3-in-1      Ambulation/Gait   Ambulation/Gait Yes    Ambulation/Gait Assistance 6: Modified independent (Device/Increase time)    Ambulation Distance (Feet) 575 Feet    Assistive device Other (Comment)   Bilateral walking poles   Gait Pattern Step-through pattern;Decreased arm swing - right;Decreased arm swing - left;Decreased step length - right;Decreased trunk rotation    Ambulation Surface Level;Indoor    Gait Comments Gait in gym area, with bilateral walking poles, assist of therapist to initiate with reciprocal motion and cues to avoid placing poles too far in front.  Pt needs multiple times to stop and reset sequence of  walking poles; after 50 ft or so, he reverts back to being off sequence with poles and same leg.      Standardized Balance Assessment   Standardized Balance Assessment Timed Up and Go Test      Timed Up and Go Test   TUG Normal TUG;Cognitive TUG    Normal TUG (seconds) 10.87    Cognitive TUG (seconds) 11.47   missed several numbers           Reviewed HEP, with pt return demo understanding with use of visual cues from handouts Reminded and educated patient optimal fitness program including aerobic activity, walking (more brisk than with walking his dog), therapy HEP  PWR! Moves in standing x 20 reps, once per day   Forward step and weigthshift x10  Back step and weightshift x 10   Partial tandem stance in corner:             -on pillow with EO and EC, head turns x 5 reps             -head nods x 5 reps   Forward step ups (step up/up, down/down x 10), side step ups (single leg x 10), foot propped on step (3 sec each, 10 reps)   With exercise:  HR 105 bpm, O2 99%.  After gait, using walking poles for cool-down stretch, using PWR! Moves in standing, 3-5 reps each position          PT Short Term Goals - 07/27/20 0736      PT SHORT TERM GOAL #1   Title Pt will be independent with HEP for improved balance, transfers, gait.  TARGET 07/16/2020    Time 4    Period Weeks    Status Achieved      PT SHORT TERM GOAL #2   Title Pt will improve 5x sit<>stand score to less than or equal to 11.5 sec for improved transfer efficiency and functional lower extremity strength.    Time 4    Period Weeks    Status Achieved      PT SHORT TERM GOAL #3   Title Pt will improve TUG and TUG cognitive score to less than or equal to 10% difference for improved ability with dual tasking with gait.    Baseline 13.09, 15.25    Time 4    Period Weeks    Status Achieved             PT Long Term Goals - 06/15/20 0743      PT LONG TERM GOAL #1   Title Pt will independent with progression of  HEP to address Parkinson's deficits for improved strength, balance and gait.  TARGET 07/30/2020    Time 6  Period Weeks    Status New      PT LONG TERM GOAL #2   Title Pt will perform 8 of 10 reps of sit<>stand from <18" surface, no UE support, no LOB, for improved functional strength and transfer efficiency.    Time 6    Period Weeks    Status New      PT LONG TERM GOAL #3   Title Pt will improve MiniBESTest to at least 25/28 for decreased fall risk.    Baseline 23/28    Time 6    Period Weeks    Status New      PT LONG TERM GOAL #4   Title Pt will improve gait velocity to at least 3 ft/sec for improved gait efficiency and safety.    Baseline 2.81 ft/sec    Time 6    Period Weeks    Status New      PT LONG TERM GOAL #5   Title Pt will ambulate at least 1000 ft, indoor and outdoor surfaces, independently, no LOB for improved community gait.    Time 6    Period Weeks    Status New                 Plan - 07/27/20 0736    Clinical Impression Statement Assessed STGs this week, with pt meeting 3 of 3 STGs.  Pt has demonstrated improvements in functional strength and balance with improved 5x sit<>stand and TUG/TUG cognitive measures.  He is performing HEP at least 3x/wk per report and demo understanding.  He continues to need intermittent UE support for corner balance exercises and for step up exercises.  He is progressing towards LTGs and improving functional mobility; he will continue to benefit from further skilled PT to improve functional mobility and balance.    Personal Factors and Comorbidities Comorbidity 3+    Comorbidities See PMH list    Examination-Activity Limitations Locomotion Level;Transfers;Stairs;Stand    Examination-Participation Restrictions Community Activity;Other   walking dog in neighborhood   Stability/Clinical Decision Making Evolving/Moderate complexity    Rehab Potential Good    PT Frequency 2x / week    PT Duration 6 weeks   plus eval   PT  Treatment/Interventions ADLs/Self Care Home Management;Neuromuscular re-education;Balance training;Therapeutic exercise;Therapeutic activities;Functional mobility training;Stair training;Gait training;Patient/family education    PT Next Visit Plan gait outdoors with walking poles; PWR! Moves standing FLOW, compliant surfaces; continue standing balance exercises, obstacles, and functional strengthening    Consulted and Agree with Plan of Care Patient           Patient will benefit from skilled therapeutic intervention in order to improve the following deficits and impairments:  Abnormal gait,Difficulty walking,Impaired tone,Decreased balance,Decreased mobility,Decreased strength,Postural dysfunction  Visit Diagnosis: Unsteadiness on feet  Other symptoms and signs involving the nervous system     Problem List Patient Active Problem List   Diagnosis Date Noted  . Primary renal papillary carcinoma, right ([place]) 02/18/2020  . Hepatic steatosis 02/18/2020  . Major depression in remission ([place]) 11/11/2019  . Central sleep apnea due to Cheyne-Stokes respiration 08/31/2019  . Elevated PSA 08/13/2019  . Hyperlipidemia associated with type 2 diabetes mellitus ([place]) 07/09/2019  . Aortic atherosclerosis ([place]) 06/10/2018  . Controlled type 2 diabetes mellitus with diabetic dermatitis, without long-term current use of insulin ([place]) 01/21/2018  . OSA treated with BiPAP 01/03/2017  . REM sleep behavior disorder 12/06/2016  . CKD stage 3 due to type 2 diabetes mellitus ([place]) 10/23/2016  . Medication  management 10/23/2016  . Vitamin D deficiency 10/23/2016  . Parkinson's disease ([place]) 01/04/2016  . Seasonal and perennial allergic rhinitis 06/21/2014  . Asthma, mild intermittent, well-controlled 06/21/2014  . Pseudomonas aeruginosa colonization 10/08/2012  . CAD (coronary artery disease) 11/15/2011  . Hypertension 11/15/2011  . Morbid obesity with BMI of 40.0-44.9, adult ([place]) 11/15/2011     Shereka Lafortune W. 07/27/2020, 9:43 AM  Frazier Butt., PT   [facility] [address] [place], [place], [address] Phone: [phone]   Fax:  [phone]  Name: Todd Rice MRN: [id number] Date of Birth: August 15, 1955

## 2020-07-27 NOTE — Therapy (Signed)
[facility] [address] [place], [place], [address] Phone: [phone]   Fax:  [phone]  Occupational Therapy Treatment  Patient Details  Name: Todd Rice MRN: [id number] Date of Birth: 04-02-1956 Referring Provider (OT): Dr. Margette Fast   Encounter Date: 07/27/2020   OT End of Session - 07/27/20 0806    Visit Number 8    Number of Visits 13    Date for OT Re-Evaluation 07/29/20    Authorization Type UHC [place] 2021, no deductible, [place]: [place], no auth    Authorization - Visit Number 8    Authorization - Number of Visits 10    Progress Note Due on Visit 10    OT Start Time 0804    OT Stop Time 0845    OT Time Calculation (min) 41 min    Activity Tolerance Patient tolerated treatment well    Behavior During Therapy [facility] for tasks assessed/performed           Past Medical History:  Diagnosis Date  . Adult BMI 50.0-59.9 kg/sq m    52.77  . Anal fissure   . Anxiety   . Diabetes mellitus without complication (HCC)    borderline  . GERD (gastroesophageal reflux disease)    30 years ago, maybe from peptic ulcer  . H/O: gout    STABLE  . Hepatic steatosis 10/01/11  . History of kidney stones   . Hyperlipidemia   . Hypertension   . Kidney tumor    right upper kidney tumor, monitoring  . OSA on CPAP    [facility]  . Parkinson disease ([place]) 01/04/2016  . REM sleep behavior disorder 12/06/2016  . Right ureteral stone   . Ureteral calculus, right 06/12/2011  . Vitamin D deficiency   . Weakness     Past Surgical History:  Procedure Laterality Date  . ANAL FISSURE REPAIR  10-12-2000  . COLONOSCOPY N/A 07/01/2012   Procedure: COLONOSCOPY;  Surgeon: Lafayette Dragon, MD;  [facility];  Service: Endoscopy;  Laterality: N/A;  . CYSTOSCOPY WITH RETROGRADE PYELOGRAM, URETEROSCOPY AND STENT PLACEMENT Bilateral 02/26/2013   Procedure: CYSTOSCOPY WITH RETROGRADE PYELOGRAM, URETEROSCOPY AND STENT PLACEMENT;   Surgeon: Alexis Frock, MD;  [facility];  Service: Urology;  Laterality: Bilateral;  . HOLMIUM LASER APPLICATION Bilateral 39/07/90   Procedure: HOLMIUM LASER APPLICATION;  Surgeon: Alexis Frock, MD;  [facility];  Service: Urology;  Laterality: Bilateral;  . kidney stone removal     06/2011-stent placement  . LEFT MEDIAL FEMORAL CONDYLE DEBRIDEMENT & DRILLING/ REMOVAL LOOSE BODY  09-15-2003  . NASAL SEPTUM SURGERY  1985  . STONE EXTRACTION WITH BASKET  06/12/2011   Procedure: STONE EXTRACTION WITH BASKET;  Surgeon: Claybon Jabs, MD;  Location: [facility];  Service: Urology;  Laterality: Right;    There were no vitals filed for this visit.   Subjective Assessment - 07/27/20 0806    Pertinent History Parkinson's Disease.  PMH:CAD, HTN, HDL, obesity, asthma, DM, stage 3 CKD, vitamin D deficiency, REM sleep disorder, OSA, aortic atherosclerosis, hx of gout, GERD, hx of anxiety    Patient Stated Goals improve use of hands    Currently in Pain? No/denies    Pain Onset In the past 7 days              Placing grooved pegs in pegboard with R hand with min difficulty with in-hand manipulation.  Standing, tossing beanbags to target with step and reach forward/back  and min cueing for incr movement amplitude.   Checked Standing Functional reach test--see goals section below  Picking up coins with each finger/thumb to place in coin bank with min difficulty with each hand.  Then stacking coins with both hands for bilateral coordination for timing with min difficulty.  Manipulating in hand to place in coin bank.  Sitting, functional reaching to place small pegs in vertical pegboard with each hand to copy design with min difficulty for coordination.  Then removing with in-hand manipulation with each hand min difficulty with R hand.       OT Short Term Goals - 07/27/20 0811      OT SHORT TERM GOAL #1   Title Pt will be independent with updated PD-specific  HEP--check STGs 07/12/20    Time 4    Period Weeks    Status Achieved      OT SHORT TERM GOAL #2   Title Pt will increase bilateral standing functional reach to 10 inches or greater bilaterally for decreased fall risk during ADLs/IADLs.    Baseline R-8", L-9"    Time 4    Period Weeks    Status Achieved   07/27/20/:  R-11.5", L-11.5"     OT SHORT TERM GOAL #3   Title Pt will demonstrate improved RUE coordination and reach for ADLs as evidenced by increasing box and blocks score to 42 blocks    Baseline RUE 38, LUE 48    Time 4    Period Weeks    Status Achieved   07/22/20:  R-47 blocks     OT SHORT TERM GOAL #4   Title Pt will be able to wrist at least 3 sentences with 100% legibility and only min decr in size.    Baseline --    Time 4    Period Weeks    Status Achieved   07/08/20     OT SHORT TERM GOAL #5   Title --             OT Long Term Goals - 06/14/20 1258      OT LONG TERM GOAL #1   Title Pt will verbalize understanding of updated strategies for ADLs to incr ease/safety and decr risk of future complications (including positioning of L shoulder).--check LTGs 07/29/20    Time 6    Period Weeks    Status New      OT LONG TERM GOAL #2   Title Pt will demo at least 145* L shoulder flex with 2/10 pain or less and -15* elbow ext for functional reach.    Baseline 140* with -20* elbow ext    Time 6    Period Weeks    Status New      OT LONG TERM GOAL #3   Title Pt will improve R hand coordination for ADLs as shown by improving time on 9-hole peg test 4 sec.    Baseline 41.16sec    Time 6    Period Weeks    Status New      OT LONG TERM GOAL #4   Title Pt will improve bilateral hand coordination for ADLs as shown by fastening/unfastening 3 buttons in 44sec or less.    Baseline 48.35sec    Time 6    Period Weeks    Status New      OT LONG TERM GOAL #5   Title --                 Plan -   07/27/20 0806    Clinical Impression Statement Pt is progressing  well with all STGs met. Pt continues to have some difficulty with higher level coordination tasks.    OT Occupational Profile and History Detailed Assessment- Review of Records and additional review of physical, cognitive, psychosocial history related to current functional performance    Occupational performance deficits (Please refer to evaluation for details): ADL's;IADL's;Leisure    Body Structure / Function / Physical Skills ADL;Balance;IADL;ROM;Improper spinal/pelvic alignment;Mobility;Tone;UE functional use;Decreased knowledge of use of DME;Pain;Coordination;FMC;Dexterity;Flexibility   bradykinesia   Cognitive Skills Memory    Rehab Potential Good    Clinical Decision Making Several treatment options, min-mod task modification necessary    Comorbidities Affecting Occupational Performance: May have comorbidities impacting occupational performance    Modification or Assistance to Complete Evaluation  Min-Moderate modification of tasks or assist with assess necessary to complete eval    OT Frequency 2x / week    OT Duration 6 weeks   +eval or 13 visits total   OT Treatment/Interventions Self-care/ADL training;DME and/or AE instruction;Balance training;Therapeutic activities;Aquatic Therapy;Therapeutic exercise;Cognitive remediation/compensation;Passive range of motion;Functional Mobility Training;Neuromuscular education;Manual Therapy;Patient/family education;Moist Heat;Cryotherapy;Ultrasound;Energy conservation;Electrical Stimulation;Fluidtherapy;Paraffin    Plan coordination/functional reach, bilateral hand coordination, timing, ADL strategies    Consulted and Agree with Plan of Care Patient           Patient will benefit from skilled therapeutic intervention in order to improve the following deficits and impairments:   Body Structure / Function / Physical Skills: ADL,Balance,IADL,ROM,Improper spinal/pelvic alignment,Mobility,Tone,UE functional use,Decreased knowledge of use of  DME,Pain,Coordination,FMC,Dexterity,Flexibility (bradykinesia) Cognitive Skills: Memory     Visit Diagnosis: Other symptoms and signs involving the nervous system  Other lack of coordination  Other symptoms and signs involving the musculoskeletal system  Abnormal posture  Unsteadiness on feet  Tremor  Stiffness of left shoulder, not elsewhere classified    Problem List Patient Active Problem List   Diagnosis Date Noted  . Primary renal papillary carcinoma, 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  . Hepatic steatosis 02/18/2020  . Major depression in remission (HCC) 11/11/2019  . Central sleep apnea due to Cheyne-Stokes respiration 08/31/2019  . Elevated PSA 08/13/2019  . Hyperlipidemia associated with type 2 diabetes mellitus (HCC) 07/09/2019  . Aortic atherosclerosis (HCC) 06/10/2018  . Controlled type 2 diabetes mellitus with diabetic dermatitis, without long-term current use of insulin (HCC) 01/21/2018  . OSA treated with BiPAP 01/03/2017  . REM sleep behavior disorder 12/06/2016  . CKD stage 3 due to type 2 diabetes mellitus (HCC) 10/23/2016  . Medication management 10/23/2016  . Vitamin D deficiency 10/23/2016  . Parkinson's disease (HCC) 01/04/2016  . Seasonal and perennial allergic rhinitis 06/21/2014  . Asthma, mild intermittent, well-controlled 06/21/2014  . Pseudomonas aeruginosa colonization 10/08/2012  . CAD (coronary artery disease) 11/15/2011  . Hypertension 11/15/2011  . Morbid obesity with BMI of 40.0-44.9, adult (HCC) 11/15/2011    FREEMAN,ANGELA 07/27/2020, 8:37 AM  [facility] [address] [place], [place], [address] Phone: [phone]   Fax:  [phone]  Name: Dequane D Patry MRN: [id number] Date of Birth: 04/30/1956   Angela Freeman, OTR/L [facility] [address] [place], [place]  [address] [phone] phone [phone] 07/27/20 8:37 AM    

## 2020-07-29 ENCOUNTER — Encounter: Payer: Self-pay | Admitting: Occupational Therapy

## 2020-07-29 ENCOUNTER — Ambulatory Visit: Payer: Medicare Other | Admitting: Occupational Therapy

## 2020-07-29 ENCOUNTER — Ambulatory Visit: Payer: Medicare Other | Admitting: Physical Therapy

## 2020-07-29 ENCOUNTER — Other Ambulatory Visit: Payer: Self-pay

## 2020-07-29 ENCOUNTER — Encounter: Payer: Self-pay | Admitting: Physical Therapy

## 2020-07-29 DIAGNOSIS — R2681 Unsteadiness on feet: Secondary | ICD-10-CM

## 2020-07-29 DIAGNOSIS — R278 Other lack of coordination: Secondary | ICD-10-CM

## 2020-07-29 DIAGNOSIS — M25512 Pain in left shoulder: Secondary | ICD-10-CM | POA: Diagnosis not present

## 2020-07-29 DIAGNOSIS — R293 Abnormal posture: Secondary | ICD-10-CM

## 2020-07-29 DIAGNOSIS — R251 Tremor, unspecified: Secondary | ICD-10-CM

## 2020-07-29 DIAGNOSIS — R29818 Other symptoms and signs involving the nervous system: Secondary | ICD-10-CM

## 2020-07-29 DIAGNOSIS — R2689 Other abnormalities of gait and mobility: Secondary | ICD-10-CM | POA: Diagnosis not present

## 2020-07-29 DIAGNOSIS — M25612 Stiffness of left shoulder, not elsewhere classified: Secondary | ICD-10-CM | POA: Diagnosis not present

## 2020-07-29 DIAGNOSIS — G8929 Other chronic pain: Secondary | ICD-10-CM | POA: Diagnosis not present

## 2020-07-29 DIAGNOSIS — R29898 Other symptoms and signs involving the musculoskeletal system: Secondary | ICD-10-CM | POA: Diagnosis not present

## 2020-07-29 NOTE — Therapy (Signed)
[facility] [address] [place], [place], [address] Phone: [phone]   Fax:  [phone]  Occupational Therapy Treatment  Patient Details  Name: Todd Rice MRN: [id number] Date of Birth: 1956/03/29 Referring Provider (OT): Dr. Margette Fast   Encounter Date: 07/29/2020   OT End of Session - 07/29/20 0730    Visit Number 9    Number of Visits 13    Date for OT Re-Evaluation 07/29/20    Authorization Type UHC [place] 2021, no deductible, [place]: [place], no auth    Authorization - Visit Number 9    Authorization - Number of Visits 10    Progress Note Due on Visit 10    OT Start Time 0720    OT Stop Time 0800    OT Time Calculation (min) 40 min    Activity Tolerance Patient tolerated treatment well    Behavior During Therapy [facility] for tasks assessed/performed           Past Medical History:  Diagnosis Date  . Adult BMI 50.0-59.9 kg/sq m    52.77  . Anal fissure   . Anxiety   . Diabetes mellitus without complication (HCC)    borderline  . GERD (gastroesophageal reflux disease)    30 years ago, maybe from peptic ulcer  . H/O: gout    STABLE  . Hepatic steatosis 10/01/11  . History of kidney stones   . Hyperlipidemia   . Hypertension   . Kidney tumor    right upper kidney tumor, monitoring  . OSA on CPAP    [facility]  . Parkinson disease ([place]) 01/04/2016  . REM sleep behavior disorder 12/06/2016  . Right ureteral stone   . Ureteral calculus, right 06/12/2011  . Vitamin D deficiency   . Weakness     Past Surgical History:  Procedure Laterality Date  . ANAL FISSURE REPAIR  10-12-2000  . COLONOSCOPY N/A 07/01/2012   Procedure: COLONOSCOPY;  Surgeon: Lafayette Dragon, MD;  Location: [facility];  Service: Endoscopy;  Laterality: N/A;  . CYSTOSCOPY WITH RETROGRADE PYELOGRAM, URETEROSCOPY AND STENT PLACEMENT Bilateral 02/26/2013   Procedure: CYSTOSCOPY WITH RETROGRADE PYELOGRAM, URETEROSCOPY AND STENT PLACEMENT;   Surgeon: Alexis Frock, MD;  Location: [facility];  Service: Urology;  Laterality: Bilateral;  . HOLMIUM LASER APPLICATION Bilateral 76/54/6503   Procedure: HOLMIUM LASER APPLICATION;  Surgeon: Alexis Frock, MD;  Location: [facility];  Service: Urology;  Laterality: Bilateral;  . kidney stone removal     06/2011-stent placement  . LEFT MEDIAL FEMORAL CONDYLE DEBRIDEMENT & DRILLING/ REMOVAL LOOSE BODY  09-15-2003  . NASAL SEPTUM SURGERY  1985  . STONE EXTRACTION WITH BASKET  06/12/2011   Procedure: STONE EXTRACTION WITH BASKET;  Surgeon: Claybon Jabs, MD;  Location: [facility];  Service: Urology;  Laterality: Right;    There were no vitals filed for this visit.   Subjective Assessment - 07/29/20 0725    Pertinent History Parkinson's Disease.  PMH:CAD, HTN, HDL, obesity, asthma, DM, stage 3 CKD, vitamin D deficiency, REM sleep disorder, OSA, aortic atherosclerosis, hx of gout, GERD, hx of anxiety    Patient Stated Goals improve use of hands    Currently in Pain? No/denies    Pain Onset In the past 7 days             PWR! Hands (up and step) with digi-extend with red band with min v.c. with each hand  Forearm gym for large amplitude movements with each wrist  Standing, functional step and reach (diagonal pattern) to flip large cards with min cueing for incr movement amplitude.    Fastening/unfastening buttons with min cueing for strategies.  Checked LTG#4--see below.  Placing O'connor pegs in pegboard with each hand with min difficulty, min cueing for shoulder compensation.  Removing keys from pocket and manipulating in each hand to correctly position various keys for use with min difficulty L hand and mod difficulty R hand.      OT Short Term Goals - 07/27/20 0811      OT SHORT TERM GOAL #1   Title Pt will be independent with updated PD-specific HEP--check STGs 07/12/20    Time 4    Period Weeks    Status Achieved      OT SHORT TERM GOAL #2   Title Pt will  increase bilateral standing functional reach to 10 inches or greater bilaterally for decreased fall risk during ADLs/IADLs.    Baseline R-8", L-9"    Time 4    Period Weeks    Status Achieved   07/27/20/:  R-11.5", L-11.5"     OT SHORT TERM GOAL #3   Title Pt will demonstrate improved RUE coordination and reach for ADLs as evidenced by increasing box and blocks score to 42 blocks    Baseline RUE 38, LUE 48    Time 4    Period Weeks    Status Achieved   07/22/20:  R-47 blocks     OT SHORT TERM GOAL #4   Title Pt will be able to wrist at least 3 sentences with 100% legibility and only min decr in size.    Baseline --    Time 4    Period Weeks    Status Achieved   07/08/20     OT SHORT TERM GOAL #5   Title --             OT Long Term Goals - 07/29/20 0749      OT LONG TERM GOAL #1   Title Pt will verbalize understanding of updated strategies for ADLs to incr ease/safety and decr risk of future complications (including positioning of L shoulder).--check LTGs 07/29/20    Time 6    Period Weeks    Status New      OT LONG TERM GOAL #2   Title Pt will demo at least 145* L shoulder flex with 2/10 pain or less and -15* elbow ext for functional reach.    Baseline 140* with -20* elbow ext    Time 6    Period Weeks    Status New      OT LONG TERM GOAL #3   Title Pt will improve R hand coordination for ADLs as shown by improving time on 9-hole peg test 4 sec.    Baseline 41.16sec    Time 6    Period Weeks    Status New      OT LONG TERM GOAL #4   Title Pt will improve bilateral hand coordination for ADLs as shown by fastening/unfastening 3 buttons in 44sec or less.    Baseline 48.35sec    Time 6    Period Weeks    Status Achieved   07/29/20:  27.91     OT LONG TERM GOAL #5   Title --                 Plan - 07/29/20 0730    Clinical Impression Statement Pt is progressing towards goals with improving movement  amplitude.  LTG #4 met with significant improvement.    OT  Occupational Profile and History Detailed Assessment- Review of Records and additional review of physical, cognitive, psychosocial history related to current functional performance    Occupational performance deficits (Please refer to evaluation for details): ADL's;IADL's;Leisure    Body Structure / Function / Physical Skills ADL;Balance;IADL;ROM;Improper spinal/pelvic alignment;Mobility;Tone;UE functional use;Decreased knowledge of use of DME;Pain;Coordination;FMC;Dexterity;Flexibility   bradykinesia   Cognitive Skills Memory    Rehab Potential Good    Clinical Decision Making Several treatment options, min-mod task modification necessary    Comorbidities Affecting Occupational Performance: May have comorbidities impacting occupational performance    Modification or Assistance to Complete Evaluation  Min-Moderate modification of tasks or assist with assess necessary to complete eval    OT Frequency 2x / week    OT Duration 6 weeks   +eval or 13 visits total   OT Treatment/Interventions Self-care/ADL training;DME and/or AE instruction;Balance training;Therapeutic activities;Aquatic Therapy;Therapeutic exercise;Cognitive remediation/compensation;Passive range of motion;Functional Mobility Training;Neuromuscular education;Manual Therapy;Patient/family education;Moist Heat;Cryotherapy;Ultrasound;Energy conservation;Electrical Stimulation;Fluidtherapy;Paraffin    Plan progress note/begin checking LTGs with anticipated d/c next week    Consulted and Agree with Plan of Care Patient           Patient will benefit from skilled therapeutic intervention in order to improve the following deficits and impairments:   Body Structure / Function / Physical Skills: ADL,Balance,IADL,ROM,Improper spinal/pelvic alignment,Mobility,Tone,UE functional use,Decreased knowledge of use of DME,Pain,Coordination,FMC,Dexterity,Flexibility (bradykinesia) Cognitive Skills: Memory     Visit Diagnosis: Other symptoms and  signs involving the nervous system  Other lack of coordination  Other symptoms and signs involving the musculoskeletal system  Abnormal posture  Unsteadiness on feet  Tremor  Stiffness of left shoulder, not elsewhere classified    Problem List Patient Active Problem List   Diagnosis Date Noted  . Primary renal papillary carcinoma, right ([place]) 02/18/2020  . Hepatic steatosis 02/18/2020  . Major depression in remission ([place]) 11/11/2019  . Central sleep apnea due to Cheyne-Stokes respiration 08/31/2019  . Elevated PSA 08/13/2019  . Hyperlipidemia associated with type 2 diabetes mellitus ([place]) 07/09/2019  . Aortic atherosclerosis ([place]) 06/10/2018  . Controlled type 2 diabetes mellitus with diabetic dermatitis, without long-term current use of insulin ([place]) 01/21/2018  . OSA treated with BiPAP 01/03/2017  . REM sleep behavior disorder 12/06/2016  . CKD stage 3 due to type 2 diabetes mellitus ([place]) 10/23/2016  . Medication management 10/23/2016  . Vitamin D deficiency 10/23/2016  . Parkinson's disease ([place]) 01/04/2016  . Seasonal and perennial allergic rhinitis 06/21/2014  . Asthma, mild intermittent, well-controlled 06/21/2014  . Pseudomonas aeruginosa colonization 10/08/2012  . CAD (coronary artery disease) 11/15/2011  . Hypertension 11/15/2011  . Morbid obesity with BMI of 40.0-44.9, adult ([place]) 11/15/2011    [facility] 07/29/2020, 12:22 PM  [facility] [address] [place], [place], [address] Phone: [phone]   Fax:  [phone]  Name: ISAC LINCKS MRN: [id number] Date of Birth: 12-Feb-1956   Vianne Bulls, OTR/L [facility] [address] [place], [place]  [address] [phone] phone [phone] 07/29/20 12:22 PM

## 2020-07-29 NOTE — Therapy (Signed)
[facility] [address] [place], [place], [address] Phone: [phone]   Fax:  [phone]  Physical Therapy Treatment  Patient Details  Name: Todd Rice MRN: [id number] Date of Birth: July 13, 1955 Referring Provider (PT): [name]   Encounter Date: 07/29/2020   PT End of Session - 07/29/20 0808    Visit Number 8    Number of Visits 13    Date for PT Re-Evaluation 08/14/20    Authorization Type UHC Medicare-PT/ST combined $30 copay; OT separate $30 copay    Progress Note Due on Visit 10    PT Start Time 0807    PT Stop Time 0845    PT Time Calculation (min) 38 min    Activity Tolerance Patient tolerated treatment well    Behavior During Therapy [facility] for tasks assessed/performed           Past Medical History:  Diagnosis Date  . Adult BMI 50.0-59.9 kg/sq m    52.77  . Anal fissure   . Anxiety   . Diabetes mellitus without complication (HCC)    borderline  . GERD (gastroesophageal reflux disease)    30 years ago, maybe from peptic ulcer  . H/O: gout    STABLE  . Hepatic steatosis 10/01/11  . History of kidney stones   . Hyperlipidemia   . Hypertension   . Kidney tumor    right upper kidney tumor, monitoring  . OSA on CPAP    [facility]  . Parkinson disease ([place]) 01/04/2016  . REM sleep behavior disorder 12/06/2016  . Right ureteral stone   . Ureteral calculus, right 06/12/2011  . Vitamin D deficiency   . Weakness     Past Surgical History:  Procedure Laterality Date  . ANAL FISSURE REPAIR  10-12-2000  . COLONOSCOPY N/A 07/01/2012   Procedure: COLONOSCOPY;  Surgeon: Lafayette Dragon, MD;  [facility];  Service: Endoscopy;  Laterality: N/A;  . CYSTOSCOPY WITH RETROGRADE PYELOGRAM, URETEROSCOPY AND STENT PLACEMENT Bilateral 02/26/2013   Procedure: CYSTOSCOPY WITH RETROGRADE PYELOGRAM, URETEROSCOPY AND STENT PLACEMENT;  Surgeon: Alexis Frock, MD;  [facility];  Service: Urology;   Laterality: Bilateral;  . HOLMIUM LASER APPLICATION Bilateral 29/79/8921   Procedure: HOLMIUM LASER APPLICATION;  Surgeon: Alexis Frock, MD;  [facility];  Service: Urology;  Laterality: Bilateral;  . kidney stone removal     06/2011-stent placement  . LEFT MEDIAL FEMORAL CONDYLE DEBRIDEMENT & DRILLING/ REMOVAL LOOSE BODY  09-15-2003  . NASAL SEPTUM SURGERY  1985  . STONE EXTRACTION WITH BASKET  06/12/2011   Procedure: STONE EXTRACTION WITH BASKET;  Surgeon: Claybon Jabs, MD;  Location: [facility];  Service: Urology;  Laterality: Right;    There were no vitals filed for this visit.   Subjective Assessment - 07/29/20 0808    Subjective Nothing new.    Patient Stated Goals Pt's goals for physical therapy are to want to make sure things continue to go well with walking and balance.    Currently in Pain? No/denies    Pain Onset In the past 7 days                             [facility] Adult PT Treatment/Exercise - 07/29/20 0001      Ambulation/Gait   Ambulation/Gait Yes    Ambulation/Gait Assistance 6: Modified independent (Device/Increase time)    Ambulation Distance (Feet) 1000 Feet    Assistive device  Other (Comment)   bilat walking poles 41 1/2 inches tall   Gait Pattern Step-through pattern;Decreased arm swing - right;Decreased arm swing - left;Decreased step length - right;Decreased trunk rotation    Ambulation Surface Level;Indoor    Gait Comments Outdoor gait using bilat walking poles, with pt needing to stop occasionally to reset for reciprocal UE/lower extremity coordination with poles.  Poles adjusted slightly lower and pt better able to keep reciprocal pattern.  With conversation tasks, he tends to get more off sequence.  PT provides occasional cues for consistently increased step length to help with reciprocal sequencing.            Before and after gait, using walking poles for warm-up, cool-down stretch, using PWR! Moves in standing,  3-5 reps each position, PT providing verbal, visual cues    Balance Exercises - 07/29/20 0001      Balance Exercises: Standing   Stepping Strategy Anterior;Posterior;Lateral;Foam/compliant surface;10 reps;Limitations    Stepping Strategy Limitations Occasional cues for increased foot clearance.  Intermittent UE support at counter    Other Standing Exercises Standing on Airex:  sidestep on and off Airex, R and L 10 reps each direction, then forward<>back step and weightshift x 10 reps, intermittent UE support    Other Standing Exercises Comments Stagger stance forward/back rocking with added reciprocal arm swing forward/back, x 10 reps each side          With rocking/reaching activity above, PT provides tactile cues for increased reciprocal arm swing.  Standing PWR! Moves Flow:  PWR! UP>PWR! Rock>PWR! Twist>PWR! Step, x 5 reps      PT Education - 07/29/20 0936    Education Details Walking for exercise with poles    Person(s) Educated Patient    Methods Explanation;Demonstration    Comprehension Verbalized understanding            PT Short Term Goals - 07/27/20 0736      PT SHORT TERM GOAL #1   Title Pt will be independent with HEP for improved balance, transfers, gait.  TARGET 07/16/2020    Time 4    Period Weeks    Status Achieved      PT SHORT TERM GOAL #2   Title Pt will improve 5x sit<>stand score to less than or equal to 11.5 sec for improved transfer efficiency and functional lower extremity strength.    Time 4    Period Weeks    Status Achieved      PT SHORT TERM GOAL #3   Title Pt will improve TUG and TUG cognitive score to less than or equal to 10% difference for improved ability with dual tasking with gait.    Baseline 13.09, 15.25    Time 4    Period Weeks    Status Achieved             PT Long Term Goals - 06/15/20 0743      PT LONG TERM GOAL #1   Title Pt will independent with progression of HEP to address Parkinson's deficits for improved  strength, balance and gait.  TARGET 07/30/2020    Time 6    Period Weeks    Status New      PT LONG TERM GOAL #2   Title Pt will perform 8 of 10 reps of sit<>stand from <18" surface, no UE support, no LOB, for improved functional strength and transfer efficiency.    Time 6    Period Weeks    Status New  PT LONG TERM GOAL #3   Title Pt will improve MiniBESTest to at least 25/28 for decreased fall risk.    Baseline 23/28    Time 6    Period Weeks    Status New      PT LONG TERM GOAL #4   Title Pt will improve gait velocity to at least 3 ft/sec for improved gait efficiency and safety.    Baseline 2.81 ft/sec    Time 6    Period Weeks    Status New      PT LONG TERM GOAL #5   Title Pt will ambulate at least 1000 ft, indoor and outdoor surfaces, independently, no LOB for improved community gait.    Time 6    Period Weeks    Status New                 Plan - 07/29/20 6283    Clinical Impression Statement Worked on longer distance gait outdoors with bilateral walking poles, on longer straight aways, pt able to maintain reciprocal sequencing with walking poles for longer distances.  With added conversation tasks, pt tends to decrease step length and get out of sequence more easily.  He is able to realize this and return to appropriate sequence.  Also worked on stepping strategies and dynamic balance on compliant surfaces, with pt neeing intermittent UE support for balance.  He will continue to benefit from skilled PT to further address balance and gait for improved functional mobility.    Personal Factors and Comorbidities Comorbidity 3+    Comorbidities See PMH list    Examination-Activity Limitations Locomotion Level;Transfers;Stairs;Stand    Examination-Participation Restrictions Community Activity;Other   walking dog in neighborhood   Stability/Clinical Decision Making Evolving/Moderate complexity    Rehab Potential Good    PT Frequency 2x / week    PT Duration 6 weeks    plus eval   PT Treatment/Interventions ADLs/Self Care Home Management;Neuromuscular re-education;Balance training;Therapeutic exercise;Therapeutic activities;Functional mobility training;Stair training;Gait training;Patient/family education    PT Next Visit Plan gait outdoors with walking poles (pt may bring his own); compliant surfaces; continue standing balance exercises, obstacles, and functional strengthening; work towards LTGs, with plan for d/c week of 3/28    Consulted and Agree with Plan of Care Patient           Patient will benefit from skilled therapeutic intervention in order to improve the following deficits and impairments:  Abnormal gait,Difficulty walking,Impaired tone,Decreased balance,Decreased mobility,Decreased strength,Postural dysfunction  Visit Diagnosis: Other abnormalities of gait and mobility  Unsteadiness on feet     Problem List Patient Active Problem List   Diagnosis Date Noted  . Primary renal papillary carcinoma, right ([place]) 02/18/2020  . Hepatic steatosis 02/18/2020  . Major depression in remission ([place]) 11/11/2019  . Central sleep apnea due to Cheyne-Stokes respiration 08/31/2019  . Elevated PSA 08/13/2019  . Hyperlipidemia associated with type 2 diabetes mellitus ([place]) 07/09/2019  . Aortic atherosclerosis ([place]) 06/10/2018  . Controlled type 2 diabetes mellitus with diabetic dermatitis, without long-term current use of insulin ([place]) 01/21/2018  . OSA treated with BiPAP 01/03/2017  . REM sleep behavior disorder 12/06/2016  . CKD stage 3 due to type 2 diabetes mellitus ([place]) 10/23/2016  . Medication management 10/23/2016  . Vitamin D deficiency 10/23/2016  . Parkinson's disease ([place]) 01/04/2016  . Seasonal and perennial allergic rhinitis 06/21/2014  . Asthma, mild intermittent, well-controlled 06/21/2014  . Pseudomonas aeruginosa colonization 10/08/2012  . CAD (coronary artery disease) 11/15/2011  .  Hypertension 11/15/2011  . Morbid obesity  with BMI of 40.0-44.9, adult ([place]) 11/15/2011    Todd Ferris W. 07/29/2020, 9:41 AM Todd Butt., PT  [facility] [address] [place], [place], [address] Phone: [phone]   Fax:  [phone]  Name: Todd Rice MRN: [id number] Date of Birth: 11-07-1955

## 2020-07-29 NOTE — Patient Instructions (Signed)
Between now and next visit, walk 10 minutes, 2 times, using bilateral walking poles

## 2020-08-02 ENCOUNTER — Ambulatory Visit (INDEPENDENT_AMBULATORY_CARE_PROVIDER_SITE_OTHER): Payer: Medicare Other | Admitting: Adult Health Nurse Practitioner

## 2020-08-02 ENCOUNTER — Other Ambulatory Visit: Payer: Self-pay | Admitting: Adult Health Nurse Practitioner

## 2020-08-02 ENCOUNTER — Other Ambulatory Visit: Payer: Self-pay

## 2020-08-02 ENCOUNTER — Encounter: Payer: Self-pay | Admitting: Adult Health Nurse Practitioner

## 2020-08-02 VITALS — BP 136/70 | HR 66 | Temp 97.6°F | Wt 264.0 lb

## 2020-08-02 DIAGNOSIS — Z1152 Encounter for screening for COVID-19: Secondary | ICD-10-CM

## 2020-08-02 DIAGNOSIS — J302 Other seasonal allergic rhinitis: Secondary | ICD-10-CM | POA: Diagnosis not present

## 2020-08-02 DIAGNOSIS — J0100 Acute maxillary sinusitis, unspecified: Secondary | ICD-10-CM

## 2020-08-02 DIAGNOSIS — J3089 Other allergic rhinitis: Secondary | ICD-10-CM

## 2020-08-02 DIAGNOSIS — E11620 Type 2 diabetes mellitus with diabetic dermatitis: Secondary | ICD-10-CM

## 2020-08-02 LAB — POC COVID19 BINAXNOW: SARS Coronavirus 2 Ag: NEGATIVE

## 2020-08-02 MED ORDER — DEXAMETHASONE SODIUM PHOSPHATE 10 MG/ML IJ SOLN
10.0000 mg | Freq: Once | INTRAMUSCULAR | Status: AC
  Administered 2020-08-02: 10 mg via INTRAMUSCULAR

## 2020-08-02 MED ORDER — DEXAMETHASONE 1 MG PO TABS: ORAL_TABLET | ORAL | 0 refills | Status: DC

## 2020-08-02 MED ORDER — MOMETASONE FUROATE 50 MCG/ACT NA SUSP: 2.0000 | Freq: Every day | NASAL | 12 refills | Status: DC

## 2020-08-02 MED ORDER — AZITHROMYCIN 250 MG PO TABS: ORAL_TABLET | ORAL | 0 refills | Status: DC

## 2020-08-02 MED FILL — AZITHROMYCIN 250 MG TABLET: 250 | 5 days supply | Qty: 6 | Fill #0

## 2020-08-02 MED FILL — DEXAMETHASONE 1 MG TABLET: 1 | 11 days supply | Qty: 20 | Fill #0

## 2020-08-02 NOTE — Progress Notes (Signed)
Assessment and Plan:  Todd Rice was seen today for facial swelling and oral swelling.  Diagnoses and all orders for this visit:  Encounter for screening for COVID-19 -     POC COVID-19 BinaxNow - Neg  Acute non-recurrent maxillary sinusitis -     azithromycin (ZITHROMAX) 250 MG tablet; Take 2 tablets PO on day 1, then 1 tablet PO Q24H x 4 days. -     dexamethasone (DECADRON) injection 10 mg in office today -     dexamethasone (DECADRON) 1 MG tablet; Take 1 tab 3 x day - 3 days, then 2 x day - 3 days, then 1 tab daily. START IF no improvement in two days. *Contact office IF no improvement consider facial CT Discussed S&S of stroke  Seasonal and perennial allergic rhinitis -     mometasone (NASONEX) 50 MCG/ACT nasal spray; Place 2 sprays into the nose daily. Continue saline nasal spray  Controlled type 2 diabetes mellitus with diabetic dermatitis, without long-term current use of insulin (HCC) Continue medications: Metformin 500mg  2 tabs BID, Januvia 100mg ,  Glipizide 5mg  as needed for glucose over 180 Discussed general issues about diabetes pathophysiology and management. Education: Reviewed 'ABCs' of diabetes management (respective goals in parentheses):  A1C (<7), blood pressure (<130/80), and cholesterol (LDL <70) Dietary recommendations Encouraged aerobic exercise.  Discussed foot care, check daily Yearly retinal exam Dental exam every 6 months Monitor blood glucose, discussed goal for patient    Further disposition pending results of labs. Discussed med's effects and SE's.   Over 30 minutes of face to face interview, exam, counseling, chart review, and critical decision making was performed.   Future Appointments  Date Time Provider [place]  08/03/2020  7:15 AM Frazier Butt, PT [facility]  08/03/2020  8:00 AM Delton Prairie, OT [facility]  08/05/2020  7:15 AM Delton Prairie, OT [facility]  08/05/2020  8:00 AM Frazier Butt, PT [facility]   08/31/2020  7:30 AM Kathrynn Ducking, MD [facility] None  09/27/2020  9:30 AM Unk Pinto, MD [facility] None  02/17/2021  2:00 PM [name], NP [facility] None  05/30/2021 11:00 AM [name], NP [facility] None    ------------------------------------------------------------------------------------------------------------------   HPI 65 y.o.male presents for evaluation of sinus symptoms.  He reports that he started having swelling to right cheek two days ago.  He is having mild sinus drainage with throat clearing.  Denies any cough.  He is blowing his nose frequently but reports tenderness when doing so.  He is having right sided facial droop, tenderness to sinus right side. EOM's in tact.  No weakness noted. All cranial nerves intact.  Past Medical History:  Diagnosis Date  . Adult BMI 50.0-59.9 kg/sq m    52.77  . Anal fissure   . Anxiety   . Diabetes mellitus without complication (HCC)    borderline  . GERD (gastroesophageal reflux disease)    30 years ago, maybe from peptic ulcer  . H/O: gout    STABLE  . Hepatic steatosis 10/01/11  . History of kidney stones   . Hyperlipidemia   . Hypertension   . Kidney tumor    right upper kidney tumor, monitoring  . OSA on CPAP    [facility]  . Parkinson disease ([place]) 01/04/2016  . REM sleep behavior disorder 12/06/2016  . Right ureteral stone   . Ureteral calculus, right 06/12/2011  . Vitamin D deficiency   . Weakness      Allergies  Allergen Reactions  . Ciprofloxacin Hives  . Ceftriaxone Hives    Current Outpatient Medications on File Prior to Visit  Medication Sig  . albuterol (VENTOLIN HFA) 108 (90 Base) MCG/ACT inhaler Use      2 inhalations      15 minutes  apart      every 4 hours      to rescue Asthma  . allopurinol (ZYLOPRIM) 300 MG tablet Take     1 tablet     Daily       to Prevent Gout  . ALPRAZolam (XANAX) 0.5 MG tablet Take 1 tablet (0.5 mg total) by mouth 3 (three) times daily as needed.  Marland Kitchen aspirin  EC 81 MG tablet Take 81 mg by mouth at bedtime.  . carbidopa-levodopa (SINEMET) 25-250 MG tablet Take 1 tablet by mouth 3 (three) times daily.  Marland Kitchen ezetimibe (ZETIA) 10 MG tablet Take 1 tablet (10 mg total) by mouth daily.  Marland Kitchen JANUVIA 100 MG tablet Take 1 tablet (100 mg total) by mouth daily.  . metFORMIN (GLUCOPHAGE-XR) 500 MG 24 hr tablet Take  2 tablets  2 x /day  for Diabetes  . pramipexole (MIRAPEX) 1 MG tablet Take 1 tablet (1 mg total) by mouth 3 (three) times daily.  . rosuvastatin (CRESTOR) 40 MG tablet Take 1 tablet (40 mg total) by mouth daily.  . valsartan-hydrochlorothiazide (DIOVAN-HCT) 320-25 MG tablet Take   1 tablet   Daily    for BP  . vitamin C (ASCORBIC ACID) 500 MG tablet Take 1 tablet (500 mg total) by mouth 3 (three) times daily.  Marland Kitchen VITAMIN D PO Take 5,000 Units by mouth 2 (two) times daily.  Marland Kitchen zolpidem (AMBIEN) 5 MG tablet Take 1 tablet (5 mg total) by mouth at bedtime as needed for sleep.  Marland Kitchen glipiZIDE (GLUCOTROL) 5 MG tablet Take 1 tablet (5 mg total) by mouth 2 (two) times daily before a meal.   No current facility-administered medications on file prior to visit.    ROS: all negative except above.   Physical Exam:  BP 136/70   Pulse 66   Temp 97.6 F (36.4 C)   Wt 264 lb (119.7 kg)   SpO2 99%   BMI 41.35 kg/m   General Appearance: Well nourished, in no apparent distress. Eyes: PERRLA, EOMs intact, conjunctiva no swelling or erythema Sinuses: Frontal/maxillary tenderness , left facial edema noted. ENT/Mouth: Ext aud canals clear, TMs without erythema, bulging. No erythema, swelling, or exudate on post pharynx.  Tonsils not swollen or erythematous. Hearing normal.  Neck: Supple, thyroid normal.  Respiratory: Respiratory effort normal, BS equal bilaterally without rales, rhonchi, wheezing or stridor.  Cardio: RRR with no MRGs. Brisk peripheral pulses without edema.  Abdomen: Soft, + BS.  Non tender, no guarding, rebound, hernias, masses. Lymphatics: Non tender  without lymphadenopathy.  Musculoskeletal: Full ROM, 5/5 strength, normal gait.  Skin: Warm, dry without rashes, lesions, ecchymosis.  Neuro: Cranial nerves intact. Normal muscle tone, no cerebellar symptoms. Sensation intact. Resting intermittent tremor (baseline) Psych: Awake and oriented X 3, normal affect, Insight and Judgment appropriate.      Garnet Sierras, Laqueta Jean, DNP [facility] 08/02/2020  2:36 PM

## 2020-08-02 NOTE — Patient Instructions (Addendum)
   Today you will received an injection of dexamethasone to help reduce inflammation of your sinuses.  We are going to send in azithromycin, take two tablets today and then one tablet daily until complete.  This will clear any bacteria.  We also sent in a dexamethasone taper for you to take.  Pick this up IF you are not improving over the next couple days.  This will elevate your blood glucose readings.  Adjust your diabetes medications, taking glipizide if over 180.  Use saline nasal spray to keep nasal cavity hydrated.   Contact office with any new or worsening symptoms.

## 2020-08-03 ENCOUNTER — Encounter: Payer: Self-pay | Admitting: Occupational Therapy

## 2020-08-03 ENCOUNTER — Ambulatory Visit: Payer: Medicare Other | Admitting: Physical Therapy

## 2020-08-03 ENCOUNTER — Ambulatory Visit: Payer: Medicare Other | Admitting: Occupational Therapy

## 2020-08-03 ENCOUNTER — Encounter: Payer: Self-pay | Admitting: Physical Therapy

## 2020-08-03 VITALS — HR 95

## 2020-08-03 DIAGNOSIS — R293 Abnormal posture: Secondary | ICD-10-CM

## 2020-08-03 DIAGNOSIS — R278 Other lack of coordination: Secondary | ICD-10-CM

## 2020-08-03 DIAGNOSIS — R2689 Other abnormalities of gait and mobility: Secondary | ICD-10-CM | POA: Diagnosis not present

## 2020-08-03 DIAGNOSIS — R29818 Other symptoms and signs involving the nervous system: Secondary | ICD-10-CM

## 2020-08-03 DIAGNOSIS — R2681 Unsteadiness on feet: Secondary | ICD-10-CM

## 2020-08-03 DIAGNOSIS — M25612 Stiffness of left shoulder, not elsewhere classified: Secondary | ICD-10-CM | POA: Diagnosis not present

## 2020-08-03 DIAGNOSIS — R29898 Other symptoms and signs involving the musculoskeletal system: Secondary | ICD-10-CM

## 2020-08-03 DIAGNOSIS — M25512 Pain in left shoulder: Secondary | ICD-10-CM | POA: Diagnosis not present

## 2020-08-03 DIAGNOSIS — G8929 Other chronic pain: Secondary | ICD-10-CM | POA: Diagnosis not present

## 2020-08-03 DIAGNOSIS — R251 Tremor, unspecified: Secondary | ICD-10-CM | POA: Diagnosis not present

## 2020-08-03 NOTE — Patient Instructions (Signed)
Optimal Fitness Program after Therapy for People with Parkinson's Disease  1)  Therapy Home Exercise Program  -Do these Exercises DAILY as instructed by your therapist  -Big, deliberate effort with exercises  -These exercises are important to perform consistently, even when therapist has  finished, because these therapy exercises often address your specific  Parkinson's difficulties   2)  Walking  -  Work up to walking 3-5 times per week, 20-30 minutes per day  -This can be done at home, driveway, quiet street or an indoor track  -Focus should be on your Best posture, arm swing, step length for your best  walking pattern  3)  Aerobic Exercise  -Work up to 3-5 times per week, 30 minutes per day  -This can be stationary bike, seated stepper machine, elliptical machine  -Work up to 7-8/10 intensity during the exercise, at minimal to moderate     Resistance

## 2020-08-03 NOTE — Therapy (Signed)
[facility] [address] [place], [place], [address] Phone: [phone]   Fax:  [phone]  Occupational Therapy Treatment  Patient Details  Name: Todd Rice MRN: [id number] Date of Birth: 09/09/1955 Referring Provider (OT): Dr. Margette Fast   Encounter Date: 08/03/2020   OT End of Session - 08/03/20 0813    Visit Number 10    Number of Visits 13    Date for OT Re-Evaluation 07/29/20    Authorization Type [employer] [place] 2021, no deductible, VL: [place], no auth    Authorization - Visit Number 10    Authorization - Number of Visits 10    Progress Note Due on Visit 10    OT Start Time 0810    OT Stop Time 0848    OT Time Calculation (min) 38 min    Activity Tolerance Patient tolerated treatment well    Behavior During Therapy [facility] for tasks assessed/performed           Past Medical History:  Diagnosis Date  . Adult BMI 50.0-59.9 kg/sq m    52.77  . Anal fissure   . Anxiety   . Diabetes mellitus without complication (HCC)    borderline  . GERD (gastroesophageal reflux disease)    30 years ago, maybe from peptic ulcer  . H/O: gout    STABLE  . Hepatic steatosis 10/01/11  . History of kidney stones   . Hyperlipidemia   . Hypertension   . Kidney tumor    right upper kidney tumor, monitoring  . OSA on CPAP    [facility]  . Parkinson disease ([place]) 01/04/2016  . REM sleep behavior disorder 12/06/2016  . Right ureteral stone   . Ureteral calculus, right 06/12/2011  . Vitamin D deficiency   . Weakness     Past Surgical History:  Procedure Laterality Date  . ANAL FISSURE REPAIR  10-12-2000  . COLONOSCOPY N/A 07/01/2012   Procedure: COLONOSCOPY;  Surgeon: Lafayette Dragon, MD;  Location: [facility];  Service: Endoscopy;  Laterality: N/A;  . CYSTOSCOPY WITH RETROGRADE PYELOGRAM, URETEROSCOPY AND STENT PLACEMENT Bilateral 02/26/2013   Procedure: CYSTOSCOPY WITH RETROGRADE PYELOGRAM, URETEROSCOPY AND STENT PLACEMENT;   Surgeon: Alexis Frock, MD;  Location: [facility];  Service: Urology;  Laterality: Bilateral;  . HOLMIUM LASER APPLICATION Bilateral 24/01/7352   Procedure: HOLMIUM LASER APPLICATION;  Surgeon: Alexis Frock, MD;  Location: [facility];  Service: Urology;  Laterality: Bilateral;  . kidney stone removal     06/2011-stent placement  . LEFT MEDIAL FEMORAL CONDYLE DEBRIDEMENT & DRILLING/ REMOVAL LOOSE BODY  09-15-2003  . NASAL SEPTUM SURGERY  1985  . STONE EXTRACTION WITH BASKET  06/12/2011   Procedure: STONE EXTRACTION WITH BASKET;  Surgeon: Claybon Jabs, MD;  Location: [facility];  Service: Urology;  Laterality: Right;    There were no vitals filed for this visit.   Subjective Assessment - 08/03/20 0811    Subjective  Had steriod injection, antibiotics, and oral steriods started yesterday (was seen by doctor).    Pertinent History Parkinson's Disease.  PMH:CAD, HTN, HDL, obesity, asthma, DM, stage 3 CKD, vitamin D deficiency, REM sleep disorder, OSA, aortic atherosclerosis, hx of gout, GERD, hx of anxiety    Patient Stated Goals improve use of hands    Currently in Pain? Yes    Pain Score 5     Pain Location Face    Pain Orientation Right    Pain Descriptors / Indicators Discomfort  Pain Type Acute pain    Pain Onset In the past 7 days    Pain Frequency Intermittent    Aggravating Factors  sinus infection    Pain Relieving Factors medication (see by MD)            Placing grooved pegs in pegboard with each UE with min difficulty for incr in-hand manipulation, min cues for opening hand prior to grasp.  Flipping cards with each hand with min cueing for incr movement amplitude with each hand.  Began checking LTGs and discussing progress--see goal section below.  Coordination today slower than last week likely due to decr activity/not feeling as well due to sinus infection.  Will reassess next visit.  Functional reaching overhead with each UE to place small pegs in vertical  pegboard to copy design with min difficulty/min cues for opening hand prior to grasp.            OT Short Term Goals - 07/27/20 0811      OT SHORT TERM GOAL #1   Title Pt will be independent with updated PD-specific HEP--check STGs 07/12/20    Time 4    Period Weeks    Status Achieved      OT SHORT TERM GOAL #2   Title Pt will increase bilateral standing functional reach to 10 inches or greater bilaterally for decreased fall risk during ADLs/IADLs.    Baseline R-8", L-9"    Time 4    Period Weeks    Status Achieved   07/27/20/:  R-11.5", L-11.5"     OT SHORT TERM GOAL #3   Title Pt will demonstrate improved RUE coordination and reach for ADLs as evidenced by increasing box and blocks score to 42 blocks    Baseline RUE 38, LUE 48    Time 4    Period Weeks    Status Achieved   07/22/20:  R-47 blocks     OT SHORT TERM GOAL #4   Title Pt will be able to wrist at least 3 sentences with 100% legibility and only min decr in size.    Baseline --    Time 4    Period Weeks    Status Achieved   07/08/20     OT SHORT TERM GOAL #5   Title --             OT Long Term Goals - 08/03/20 0826      OT LONG TERM GOAL #1   Title Pt will verbalize understanding of updated strategies for ADLs to incr ease/safety and decr risk of future complications (including positioning of L shoulder).--check LTGs 07/29/20    Time 6    Period Weeks    Status New      OT LONG TERM GOAL #2   Title Pt will demo at least 145* L shoulder flex with 2/10 pain or less and -15* elbow ext for functional reach.    Baseline 140* with -20* elbow ext    Time 6    Period Weeks    Status Achieved   08/03/20:  155* with -5* elbow ext     OT LONG TERM GOAL #3   Title Pt will improve R hand coordination for ADLs as shown by improving time on 9-hole peg test 4 sec.    Baseline 41.16sec    Time 6    Period Weeks    Status On-going   08/03/20:  R-44.54sec, L-37.69sec     OT LONG TERM GOAL #4  Title Pt will improve  bilateral hand coordination for ADLs as shown by fastening/unfastening 3 buttons in 44sec or less.    Baseline 48.35sec    Time 6    Period Weeks    Status Achieved   07/29/20:  27.91     OT LONG TERM GOAL #5   Title --                 Plan - 08/03/20 0813    Clinical Impression Statement Progress Note Reporting Period 06/14/20-08/03/20:   Pt is progressing towards goals with improving movement amplitude.  Pt with incr difficulty with coordination today likely due to not feeling as well--will reassess next visit.  Pt would benefit from additional occupational therapy visit to check remaining goals and reinforce movement strategies for incr carryover.    OT Occupational Profile and History Detailed Assessment- Review of Records and additional review of physical, cognitive, psychosocial history related to current functional performance    Occupational performance deficits (Please refer to evaluation for details): ADL's;IADL's;Leisure    Body Structure / Function / Physical Skills ADL;Balance;IADL;ROM;Improper spinal/pelvic alignment;Mobility;Tone;UE functional use;Decreased knowledge of use of DME;Pain;Coordination;FMC;Dexterity;Flexibility   bradykinesia   Cognitive Skills Memory    Rehab Potential Good    Clinical Decision Making Several treatment options, min-mod task modification necessary    Comorbidities Affecting Occupational Performance: May have comorbidities impacting occupational performance    Modification or Assistance to Complete Evaluation  Min-Moderate modification of tasks or assist with assess necessary to complete eval    OT Frequency 2x / week    OT Duration 6 weeks   +eval or 13 visits total   OT Treatment/Interventions Self-care/ADL training;DME and/or AE instruction;Balance training;Therapeutic activities;Aquatic Therapy;Therapeutic exercise;Cognitive remediation/compensation;Passive range of motion;Functional Mobility Training;Neuromuscular education;Manual  Therapy;Patient/family education;Moist Heat;Cryotherapy;Ultrasound;Energy conservation;Electrical Stimulation;Fluidtherapy;Paraffin    Plan check remaining goals and anticipate d/c.  schedule follow-up eval/screen.    Consulted and Agree with Plan of Care Patient           Patient will benefit from skilled therapeutic intervention in order to improve the following deficits and impairments:   Body Structure / Function / Physical Skills: ADL,Balance,IADL,ROM,Improper spinal/pelvic alignment,Mobility,Tone,UE functional use,Decreased knowledge of use of DME,Pain,Coordination,FMC,Dexterity,Flexibility (bradykinesia) Cognitive Skills: Memory     Visit Diagnosis: Other symptoms and signs involving the nervous system  Other lack of coordination  Other symptoms and signs involving the musculoskeletal system  Abnormal posture  Unsteadiness on feet  Tremor  Stiffness of left shoulder, not elsewhere classified    Problem List Patient Active Problem List   Diagnosis Date Noted  . Primary renal papillary carcinoma, right ([place]) 02/18/2020  . Hepatic steatosis 02/18/2020  . Major depression in remission ([place]) 11/11/2019  . Central sleep apnea due to Cheyne-Stokes respiration 08/31/2019  . Elevated PSA 08/13/2019  . Hyperlipidemia associated with type 2 diabetes mellitus ([place]) 07/09/2019  . Aortic atherosclerosis ([place]) 06/10/2018  . Controlled type 2 diabetes mellitus with diabetic dermatitis, without long-term current use of insulin ([place]) 01/21/2018  . OSA treated with BiPAP 01/03/2017  . REM sleep behavior disorder 12/06/2016  . CKD stage 3 due to type 2 diabetes mellitus ([place]) 10/23/2016  . Medication management 10/23/2016  . Vitamin D deficiency 10/23/2016  . Parkinson's disease ([place]) 01/04/2016  . Seasonal and perennial allergic rhinitis 06/21/2014  . Asthma, mild intermittent, well-controlled 06/21/2014  . Pseudomonas aeruginosa colonization 10/08/2012  . CAD (coronary artery  disease) 11/15/2011  . Hypertension 11/15/2011  . Morbid obesity with BMI of 40.0-44.9, adult ([place]) 11/15/2011  [facility] 08/03/2020, 1:02 PM  [facility] [address] [place], [place], [address] Phone: [phone]   Fax:  [phone]  Name: FRANS VALENTE MRN: [id number] Date of Birth: [birth year]   Vianne Bulls, OTR/L [facility] [address] [place], [place]  [address] [phone] phone [phone] 08/03/20 1:02 PM

## 2020-08-03 NOTE — Therapy (Signed)
[facility] [address] [place], [place], [address] Phone: [phone]   Fax:  [phone]  Physical Therapy Treatment  Patient Details  Name: Todd Rice MRN: [id number] Date of Birth: 05/04/1956 Referring Provider (PT): [name]   Encounter Date: 08/03/2020   PT End of Session - 08/03/20 0723    Visit Number 9    Number of Visits 13    Date for PT Re-Evaluation 08/14/20    Authorization Type UHC Medicare-PT/ST combined $30 copay; OT separate $30 copay    Progress Note Due on Visit 10    PT Start Time 0718    PT Stop Time 0757    PT Time Calculation (min) 39 min    Activity Tolerance Patient tolerated treatment well   O2 sats 92-93%, HR 113 bpm after activity   Behavior During Therapy [facility] for tasks assessed/performed           Past Medical History:  Diagnosis Date  . Adult BMI 50.0-59.9 kg/sq m    52.77  . Anal fissure   . Anxiety   . Diabetes mellitus without complication (HCC)    borderline  . GERD (gastroesophageal reflux disease)    30 years ago, maybe from peptic ulcer  . H/O: gout    STABLE  . Hepatic steatosis 10/01/11  . History of kidney stones   . Hyperlipidemia   . Hypertension   . Kidney tumor    right upper kidney tumor, monitoring  . OSA on CPAP    [facility]  . Parkinson disease ([place]) 01/04/2016  . REM sleep behavior disorder 12/06/2016  . Right ureteral stone   . Ureteral calculus, right 06/12/2011  . Vitamin D deficiency   . Weakness     Past Surgical History:  Procedure Laterality Date  . ANAL FISSURE REPAIR  10-12-2000  . COLONOSCOPY N/A 07/01/2012   Procedure: COLONOSCOPY;  Surgeon: Lafayette Dragon, MD;  Location: [facility];  Service: Endoscopy;  Laterality: N/A;  . CYSTOSCOPY WITH RETROGRADE PYELOGRAM, URETEROSCOPY AND STENT PLACEMENT Bilateral 02/26/2013   Procedure: CYSTOSCOPY WITH RETROGRADE PYELOGRAM, URETEROSCOPY AND STENT PLACEMENT;  Surgeon: Alexis Frock, MD;   [facility]: [facility];  Service: Urology;  Laterality: Bilateral;  . HOLMIUM LASER APPLICATION Bilateral 25/95/6387   Procedure: HOLMIUM LASER APPLICATION;  Surgeon: Alexis Frock, MD;  Location: [facility];  Service: Urology;  Laterality: Bilateral;  . kidney stone removal     06/2011-stent placement  . LEFT MEDIAL FEMORAL CONDYLE DEBRIDEMENT & DRILLING/ REMOVAL LOOSE BODY  09-15-2003  . NASAL SEPTUM SURGERY  1985  . STONE EXTRACTION WITH BASKET  06/12/2011   Procedure: STONE EXTRACTION WITH BASKET;  Surgeon: Claybon Jabs, MD;  Location: [facility];  Service: Urology;  Laterality: Right;    Vitals:   08/03/20 0721  Pulse: 95  SpO2: 96%     Subjective Assessment - 08/03/20 0721    Subjective Have a really bad sinus infection.  Swelling in my face, but saw the doctor and got some medication.    Patient Stated Goals Pt's goals for physical therapy are to want to make sure things continue to go well with walking and balance.    Currently in Pain? No/denies    Pain Score 5    aggravated pain by touching-sinus infection   Pain Location Face    Pain Orientation Right    Pain Descriptors / Indicators Discomfort    Pain Type Acute pain    Pain Onset In the  past 7 days    Pain Frequency Intermittent    Aggravating Factors  sinus infection    Pain Relieving Factors medication (seen by MD)                             [facility] Adult PT Treatment/Exercise - 08/03/20 0001      Ambulation/Gait   Ambulation/Gait Yes    Ambulation/Gait Assistance 6: Modified independent (Device/Increase time)    Ambulation Distance (Feet) 400 Feet    Assistive device None    Gait Pattern Step-through pattern;Decreased arm swing - right;Decreased arm swing - left;Decreased step length - right;Decreased trunk rotation    Ambulation Surface Level;Indoor    Gait Comments Stepping over and around negotiating obstacles, no LOB, with self-corrected increased step length.      High Level  Balance   High Level Balance Activities Other (comment)    High Level Balance Comments Standing on compliant incline/decline surfaces:  feet apart, feet together, partial tandem stance with EO head turns/nods x 10, EC head turns/nods x 10 (except partial tandem and EC head steady x 15 sec).  Negotiating ramp/blue mat surface, x2, with cues for foot clearance, posture for inclines with gait outdoors at home.      Self-Care   Self-Care Other Self-Care Comments    Other Self-Care Comments  Discussed optimal fitness program upon d/c from PT                  PT Education - 08/03/20 0759    Education Details Optimal fitness program upon d/c-see instructions    Person(s) Educated Patient    Methods Explanation;Handout    Comprehension Verbalized understanding            PT Short Term Goals - 07/27/20 0736      PT SHORT TERM GOAL #1   Title Pt will be independent with HEP for improved balance, transfers, gait.  TARGET 07/16/2020    Time 4    Period Weeks    Status Achieved      PT SHORT TERM GOAL #2   Title Pt will improve 5x sit<>stand score to less than or equal to 11.5 sec for improved transfer efficiency and functional lower extremity strength.    Time 4    Period Weeks    Status Achieved      PT SHORT TERM GOAL #3   Title Pt will improve TUG and TUG cognitive score to less than or equal to 10% difference for improved ability with dual tasking with gait.    Baseline 13.09, 15.25    Time 4    Period Weeks    Status Achieved             PT Long Term Goals - 06/15/20 0743      PT LONG TERM GOAL #1   Title Pt will independent with progression of HEP to address Parkinson's deficits for improved strength, balance and gait.  TARGET 07/30/2020    Time 6    Period Weeks    Status New      PT LONG TERM GOAL #2   Title Pt will perform 8 of 10 reps of sit<>stand from <18" surface, no UE support, no LOB, for improved functional strength and transfer efficiency.    Time 6     Period Weeks    Status New      PT LONG TERM GOAL #3   Title Pt will improve  MiniBESTest to at least 25/28 for decreased fall risk.    Baseline 23/28    Time 6    Period Weeks    Status New      PT LONG TERM GOAL #4   Title Pt will improve gait velocity to at least 3 ft/sec for improved gait efficiency and safety.    Baseline 2.81 ft/sec    Time 6    Period Weeks    Status New      PT LONG TERM GOAL #5   Title Pt will ambulate at least 1000 ft, indoor and outdoor surfaces, independently, no LOB for improved community gait.    Time 6    Period Weeks    Status New                 Plan - 08/03/20 1154    Clinical Impression Statement Skilled PT session today focused on gait, obstacle negotiation and balance activities, as well as follow-up education on continued fitness program post d/c.  Pt is progressing towards goals, with slightly less tolerance for activity today due to sinus infection.  He is on track towards LTG check and d/c next visit.    Personal Factors and Comorbidities Comorbidity 3+    Comorbidities See PMH list    Examination-Activity Limitations Locomotion Level;Transfers;Stairs;Stand    Examination-Participation Restrictions Community Activity;Other   walking dog in neighborhood   Stability/Clinical Decision Making Evolving/Moderate complexity    Rehab Potential Good    PT Frequency 2x / week    PT Duration 6 weeks   plus eval   PT Treatment/Interventions ADLs/Self Care Home Management;Neuromuscular re-education;Balance training;Therapeutic exercise;Therapeutic activities;Functional mobility training;Stair training;Gait training;Patient/family education    PT Next Visit Plan Check LTGs; follow up with continued fitness plan, plan for d/c next week.    Consulted and Agree with Plan of Care Patient           Patient will benefit from skilled therapeutic intervention in order to improve the following deficits and impairments:  Abnormal gait,Difficulty  walking,Impaired tone,Decreased balance,Decreased mobility,Decreased strength,Postural dysfunction  Visit Diagnosis: Other abnormalities of gait and mobility  Unsteadiness on feet     Problem List Patient Active Problem List   Diagnosis Date Noted  . Primary renal papillary carcinoma, right ([place]) 02/18/2020  . Hepatic steatosis 02/18/2020  . Major depression in remission ([place]) 11/11/2019  . Central sleep apnea due to Cheyne-Stokes respiration 08/31/2019  . Elevated PSA 08/13/2019  . Hyperlipidemia associated with type 2 diabetes mellitus ([place]) 07/09/2019  . Aortic atherosclerosis ([place]) 06/10/2018  . Controlled type 2 diabetes mellitus with diabetic dermatitis, without long-term current use of insulin ([place]) 01/21/2018  . OSA treated with BiPAP 01/03/2017  . REM sleep behavior disorder 12/06/2016  . CKD stage 3 due to type 2 diabetes mellitus ([place]) 10/23/2016  . Medication management 10/23/2016  . Vitamin D deficiency 10/23/2016  . Parkinson's disease ([place]) 01/04/2016  . Seasonal and perennial allergic rhinitis 06/21/2014  . Asthma, mild intermittent, well-controlled 06/21/2014  . Pseudomonas aeruginosa colonization 10/08/2012  . CAD (coronary artery disease) 11/15/2011  . Hypertension 11/15/2011  . Morbid obesity with BMI of 40.0-44.9, adult ([place]) 11/15/2011    Easton Fetty W. 08/03/2020, 11:57 AM Frazier Butt., PT  [facility] [address] [place], [place], [address] Phone: [phone]   Fax:  [phone]  Name: Todd Rice MRN: [id number] Date of Birth: [birth year]

## 2020-08-05 ENCOUNTER — Ambulatory Visit: Payer: Medicare Other | Admitting: Occupational Therapy

## 2020-08-05 ENCOUNTER — Ambulatory Visit: Payer: Medicare Other | Admitting: Physical Therapy

## 2020-08-05 ENCOUNTER — Encounter: Payer: Self-pay | Admitting: Occupational Therapy

## 2020-08-05 ENCOUNTER — Other Ambulatory Visit: Payer: Self-pay

## 2020-08-05 DIAGNOSIS — R293 Abnormal posture: Secondary | ICD-10-CM | POA: Diagnosis not present

## 2020-08-05 DIAGNOSIS — R29898 Other symptoms and signs involving the musculoskeletal system: Secondary | ICD-10-CM

## 2020-08-05 DIAGNOSIS — R251 Tremor, unspecified: Secondary | ICD-10-CM

## 2020-08-05 DIAGNOSIS — M25512 Pain in left shoulder: Secondary | ICD-10-CM | POA: Diagnosis not present

## 2020-08-05 DIAGNOSIS — R278 Other lack of coordination: Secondary | ICD-10-CM

## 2020-08-05 DIAGNOSIS — R2681 Unsteadiness on feet: Secondary | ICD-10-CM | POA: Diagnosis not present

## 2020-08-05 DIAGNOSIS — R29818 Other symptoms and signs involving the nervous system: Secondary | ICD-10-CM

## 2020-08-05 DIAGNOSIS — R2689 Other abnormalities of gait and mobility: Secondary | ICD-10-CM | POA: Diagnosis not present

## 2020-08-05 DIAGNOSIS — G8929 Other chronic pain: Secondary | ICD-10-CM | POA: Diagnosis not present

## 2020-08-05 DIAGNOSIS — M25612 Stiffness of left shoulder, not elsewhere classified: Secondary | ICD-10-CM | POA: Diagnosis not present

## 2020-08-05 NOTE — Therapy (Signed)
[facility] [address] [place], [place], [address] Phone: [phone]   Fax:  [phone]  Occupational Therapy Treatment  Patient Details  Name: Todd Rice MRN: [id number] Date of Birth: August 22, 1955 Referring Provider (OT): Dr. Margette Fast   Encounter Date: 08/05/2020   OT End of Session - 08/05/20 0722    Visit Number 11    Number of Visits 13    Date for OT Re-Evaluation 07/29/20    Authorization Type UHC [place] 2021, no deductible, [place]: [place], no auth    Authorization - Visit Number 11    Authorization - Number of Visits 20    Progress Note Due on Visit 10    OT Start Time 0719    OT Stop Time 0800    OT Time Calculation (min) 41 min    Activity Tolerance Patient tolerated treatment well    Behavior During Therapy [facility] for tasks assessed/performed           Past Medical History:  Diagnosis Date  . Adult BMI 50.0-59.9 kg/sq m    52.77  . Anal fissure   . Anxiety   . Diabetes mellitus without complication (HCC)    borderline  . GERD (gastroesophageal reflux disease)    30 years ago, maybe from peptic ulcer  . H/O: gout    STABLE  . Hepatic steatosis 10/01/11  . History of kidney stones   . Hyperlipidemia   . Hypertension   . Kidney tumor    right upper kidney tumor, monitoring  . OSA on CPAP    [facility]  . Parkinson disease ([place]) 01/04/2016  . REM sleep behavior disorder 12/06/2016  . Right ureteral stone   . Ureteral calculus, right 06/12/2011  . Vitamin D deficiency   . Weakness     Past Surgical History:  Procedure Laterality Date  . ANAL FISSURE REPAIR  10-12-2000  . COLONOSCOPY N/A 07/01/2012   Procedure: COLONOSCOPY;  Surgeon: Lafayette Dragon, MD;  [facility];  Service: Endoscopy;  Laterality: N/A;  . CYSTOSCOPY WITH RETROGRADE PYELOGRAM, URETEROSCOPY AND STENT PLACEMENT Bilateral 02/26/2013   Procedure: CYSTOSCOPY WITH RETROGRADE PYELOGRAM, URETEROSCOPY AND STENT PLACEMENT;   Surgeon: Alexis Frock, MD;  [facility];  Service: Urology;  Laterality: Bilateral;  . HOLMIUM LASER APPLICATION Bilateral 93/57/0177   Procedure: HOLMIUM LASER APPLICATION;  Surgeon: Alexis Frock, MD;  [facility];  Service: Urology;  Laterality: Bilateral;  . kidney stone removal     06/2011-stent placement  . LEFT MEDIAL FEMORAL CONDYLE DEBRIDEMENT & DRILLING/ REMOVAL LOOSE BODY  09-15-2003  . NASAL SEPTUM SURGERY  1985  . STONE EXTRACTION WITH BASKET  06/12/2011   Procedure: STONE EXTRACTION WITH BASKET;  Surgeon: Claybon Jabs, MD;  [facility];  Service: Urology;  Laterality: Right;    There were no vitals filed for this visit.   Subjective Assessment - 08/05/20 0721    Subjective  Sinus infection feeling better    Pertinent History Parkinson's Disease.  PMH:CAD, HTN, HDL, obesity, asthma, DM, stage 3 CKD, vitamin D deficiency, REM sleep disorder, OSA, aortic atherosclerosis, hx of gout, GERD, hx of anxiety    Patient Stated Goals improve use of hands    Currently in Pain? No/denies    Pain Onset In the past 7 days            Standing, Tossing scarves with each UE/LE with diagonal step and toss with min cueing for incr movement amplitude.  Then tossing 2 scarves (1 in each hand) with therapist with forward/backward wt. Shifts with min cueing for incr movement amplitude/wt. Shifts.  Standing, functional step and reach in diagonal patterns to flip cards with min cueing for incr movement amplitude with each UE/LE.  Sliding cards across table with each UE with min cueing for incr effort/power of movement.    Standing, functional movements (floor>overhead, trunk rotation, wt. Shift, PWR! Up, PWR! Twist, etc) with boomwhackers with BUEs in sequence for dual task and large amplitude movements, min v.c.  Checked remaining goals and discussed progress and follow-up recommendations (pt would like to schedule OT/PT evals and ST screen) in approx 6  months.  Pt agrees with progress, d/c, and plan.       OT Short Term Goals - 07/27/20 0811      OT SHORT TERM GOAL #1   Title Pt will be independent with updated PD-specific HEP--check STGs 07/12/20    Time 4    Period Weeks    Status Achieved      OT SHORT TERM GOAL #2   Title Pt will increase bilateral standing functional reach to 10 inches or greater bilaterally for decreased fall risk during ADLs/IADLs.    Baseline R-8", L-9"    Time 4    Period Weeks    Status Achieved   07/27/20/:  R-11.5", L-11.5"     OT SHORT TERM GOAL #3   Title Pt will demonstrate improved RUE coordination and reach for ADLs as evidenced by increasing box and blocks score to 42 blocks    Baseline RUE 38, LUE 48    Time 4    Period Weeks    Status Achieved   07/22/20:  R-47 blocks     OT SHORT TERM GOAL #4   Title Pt will be able to wrist at least 3 sentences with 100% legibility and only min decr in size.    Baseline --    Time 4    Period Weeks    Status Achieved   07/08/20     OT SHORT TERM GOAL #5   Title --             OT Long Term Goals - 08/05/20 0757      OT LONG TERM GOAL #1   Title Pt will verbalize understanding of updated strategies for ADLs to incr ease/safety and decr risk of future complications (including positioning of L shoulder).--check LTGs 07/29/20    Time 6    Period Weeks    Status Achieved      OT LONG TERM GOAL #2   Title Pt will demo at least 145* L shoulder flex with 2/10 pain or less and -15* elbow ext for functional reach.    Baseline 140* with -20* elbow ext    Time 6    Period Weeks    Status Achieved   08/03/20:  155* with -5* elbow ext     OT LONG TERM GOAL #3   Title Pt will improve R hand coordination for ADLs as shown by improving time on 9-hole peg test 4 sec.    Baseline 41.16sec    Time 6    Period Weeks    Status Achieved   08/03/20:  R-44.54sec, L-37.69sec.  08/05/20:  R-33.13sec, L-28.82sec     OT LONG TERM GOAL #4   Title Pt will improve  bilateral hand coordination for ADLs as shown by fastening/unfastening 3 buttons in 44sec or less.    Baseline 48.35sec  Time 6    Period Weeks    Status Achieved   07/29/20:  27.91.     OT LONG TERM GOAL #5   Title --                 Plan - 08/05/20 0724    Clinical Impression Statement Pt has made excellent progress with improved coordination, functional reach, and improved carryover of movement amplitude to functional tasks (pt reports sinus infection improving) with decr cueing.  All goals met.    OT Occupational Profile and History Detailed Assessment- Review of Records and additional review of physical, cognitive, psychosocial history related to current functional performance    Occupational performance deficits (Please refer to evaluation for details): ADL's;IADL's;Leisure    Body Structure / Function / Physical Skills ADL;Balance;IADL;ROM;Improper spinal/pelvic alignment;Mobility;Tone;UE functional use;Decreased knowledge of use of DME;Pain;Coordination;FMC;Dexterity;Flexibility   bradykinesia   Cognitive Skills Memory    Rehab Potential Good    Clinical Decision Making Several treatment options, min-mod task modification necessary    Comorbidities Affecting Occupational Performance: May have comorbidities impacting occupational performance    Modification or Assistance to Complete Evaluation  Min-Moderate modification of tasks or assist with assess necessary to complete eval    OT Frequency 2x / week    OT Duration 6 weeks   +eval or 13 visits total   OT Treatment/Interventions Self-care/ADL training;DME and/or AE instruction;Balance training;Therapeutic activities;Aquatic Therapy;Therapeutic exercise;Cognitive remediation/compensation;Passive range of motion;Functional Mobility Training;Neuromuscular education;Manual Therapy;Patient/family education;Moist Heat;Cryotherapy;Ultrasound;Energy conservation;Electrical Stimulation;Fluidtherapy;Paraffin    Plan d/c OT, recommended  follow-up eval in approx 6 months    Consulted and Agree with Plan of Care Patient           Patient will benefit from skilled therapeutic intervention in order to improve the following deficits and impairments:   Body Structure / Function / Physical Skills: ADL,Balance,IADL,ROM,Improper spinal/pelvic alignment,Mobility,Tone,UE functional use,Decreased knowledge of use of DME,Pain,Coordination,FMC,Dexterity,Flexibility (bradykinesia) Cognitive Skills: Memory     Visit Diagnosis: Other symptoms and signs involving the nervous system  Other lack of coordination  Other symptoms and signs involving the musculoskeletal system  Abnormal posture  Unsteadiness on feet  Tremor  Stiffness of left shoulder, not elsewhere classified  Other abnormalities of gait and mobility    Problem List Patient Active Problem List   Diagnosis Date Noted  . Primary renal papillary carcinoma, right ([place]) 02/18/2020  . Hepatic steatosis 02/18/2020  . Major depression in remission ([place]) 11/11/2019  . Central sleep apnea due to Cheyne-Stokes respiration 08/31/2019  . Elevated PSA 08/13/2019  . Hyperlipidemia associated with type 2 diabetes mellitus ([place]) 07/09/2019  . Aortic atherosclerosis ([place]) 06/10/2018  . Controlled type 2 diabetes mellitus with diabetic dermatitis, without long-term current use of insulin ([place]) 01/21/2018  . OSA treated with BiPAP 01/03/2017  . REM sleep behavior disorder 12/06/2016  . CKD stage 3 due to type 2 diabetes mellitus ([place]) 10/23/2016  . Medication management 10/23/2016  . Vitamin D deficiency 10/23/2016  . Parkinson's disease ([place]) 01/04/2016  . Seasonal and perennial allergic rhinitis 06/21/2014  . Asthma, mild intermittent, well-controlled 06/21/2014  . Pseudomonas aeruginosa colonization 10/08/2012  . CAD (coronary artery disease) 11/15/2011  . Hypertension 11/15/2011  . Morbid obesity with BMI of 40.0-44.9, adult ([place]) 11/15/2011    OCCUPATIONAL  THERAPY DISCHARGE SUMMARY  Visits from Start of Care: 11  Current functional level related to goals / functional outcomes: See above   Remaining deficits: Bradykinesia, rigidity, decr coordination, abnormal posture, decr balance/functional mobility, Tremors--all improved for functional movements  Education / Equipment: Pt was instructed in the following:  PD-specific HEP, adaptive strategies for ADLs/IADLs, ways to prevent future complications and community resources (provided by PT).  Pt verbalized understanding of all education provided.   Plan: Patient agrees to discharge.  Patient goals were  met. Patient is being discharged due to being pleased with the current functional level/meeting goals.  Pt would benefit from occupational therapy evaluation  in approx 6 months to assess for need for further therapy/functional changes due to progressive nature of diagnosis.     [facility] 08/05/2020, 8:19 AM  [facility] [address] [place], [place], [address] Phone: [phone]   Fax:  [phone]  Name: Todd Rice MRN: [id number] Date of Birth: 24-Apr-1956   Vianne Bulls, OTR/L [facility] [address] [place], [place]  [address] [phone] phone [phone] 08/05/20 8:19 AM

## 2020-08-06 ENCOUNTER — Other Ambulatory Visit: Payer: Self-pay | Admitting: Adult Health

## 2020-08-06 ENCOUNTER — Other Ambulatory Visit: Payer: Self-pay | Admitting: Internal Medicine

## 2020-08-06 DIAGNOSIS — G20 Parkinson's disease: Secondary | ICD-10-CM

## 2020-08-07 ENCOUNTER — Other Ambulatory Visit (HOSPITAL_COMMUNITY): Payer: Self-pay

## 2020-08-07 MED FILL — Allopurinol Tab 300 MG: ORAL | 90 days supply | Qty: 90 | Fill #0 | Status: AC

## 2020-08-07 MED FILL — Alprazolam Tab 0.5 MG: ORAL | 30 days supply | Qty: 90 | Fill #0 | Status: AC

## 2020-08-08 ENCOUNTER — Other Ambulatory Visit (HOSPITAL_COMMUNITY): Payer: Self-pay

## 2020-08-09 ENCOUNTER — Other Ambulatory Visit (HOSPITAL_COMMUNITY): Payer: Self-pay

## 2020-08-09 MED FILL — Sitagliptin Phosphate Tab 100 MG (Base Equiv): ORAL | 90 days supply | Qty: 90 | Fill #0 | Status: AC

## 2020-08-12 ENCOUNTER — Ambulatory Visit: Payer: Medicare Other | Admitting: Physical Therapy

## 2020-08-17 ENCOUNTER — Other Ambulatory Visit (HOSPITAL_BASED_OUTPATIENT_CLINIC_OR_DEPARTMENT_OTHER): Payer: Self-pay

## 2020-08-17 ENCOUNTER — Ambulatory Visit: Payer: Medicare Other | Attending: Internal Medicine

## 2020-08-17 ENCOUNTER — Other Ambulatory Visit: Payer: Self-pay

## 2020-08-17 DIAGNOSIS — Z23 Encounter for immunization: Secondary | ICD-10-CM

## 2020-08-17 NOTE — Progress Notes (Signed)
   Covid-19 Vaccination Clinic  Name:  Todd Rice    MRN: [id number] DOB: Dec 16, 1955  08/17/2020  [name] was observed post Covid-19 immunization for 15 minutes without incident. He was provided with Vaccine Information Sheet and instruction to access the V-Safe system.   [name] was instructed to call 911 with any severe reactions post vaccine: Marland Kitchen Difficulty breathing  . Swelling of face and throat  . A fast heartbeat  . A bad rash all over body  . Dizziness and weakness   Immunizations Administered    Name Date Dose VIS Date Route   PFIZER Comrnaty(Gray TOP) Covid-19 Vaccine 08/17/2020  2:12 PM 0.3 mL 04/15/2020 Intramuscular   Manufacturer: [employer], [employer]   Lot: [id number]   NDC: [phone]

## 2020-08-20 ENCOUNTER — Other Ambulatory Visit (HOSPITAL_BASED_OUTPATIENT_CLINIC_OR_DEPARTMENT_OTHER): Payer: Self-pay

## 2020-08-20 MED ORDER — COVID-19 MRNA VACCINE (PFIZER) 30 MCG/0.3ML IM SUSP
INTRAMUSCULAR | 0 refills | Status: DC
  Filled 2020-08-20: qty 0.3, 1d supply, fill #0

## 2020-08-24 ENCOUNTER — Other Ambulatory Visit (HOSPITAL_COMMUNITY): Payer: Self-pay

## 2020-08-24 ENCOUNTER — Encounter: Payer: Self-pay | Admitting: Physical Therapy

## 2020-08-24 ENCOUNTER — Other Ambulatory Visit: Payer: Self-pay

## 2020-08-24 ENCOUNTER — Ambulatory Visit: Payer: Medicare Other | Attending: Neurology | Admitting: Physical Therapy

## 2020-08-24 ENCOUNTER — Other Ambulatory Visit: Payer: Self-pay | Admitting: Adult Health Nurse Practitioner

## 2020-08-24 DIAGNOSIS — R29818 Other symptoms and signs involving the nervous system: Secondary | ICD-10-CM | POA: Diagnosis not present

## 2020-08-24 DIAGNOSIS — I10 Essential (primary) hypertension: Secondary | ICD-10-CM

## 2020-08-24 DIAGNOSIS — R2681 Unsteadiness on feet: Secondary | ICD-10-CM

## 2020-08-24 DIAGNOSIS — R2689 Other abnormalities of gait and mobility: Secondary | ICD-10-CM | POA: Diagnosis not present

## 2020-08-24 MED ORDER — VALSARTAN-HYDROCHLOROTHIAZIDE 320-25 MG PO TABS
1.0000 | ORAL_TABLET | Freq: Every day | ORAL | 0 refills | Status: DC
  Filled 2020-08-24: qty 90, 90d supply, fill #0

## 2020-08-24 NOTE — Therapy (Signed)
[facility] [address] [place], [place], [address] Phone: [phone]   Fax:  [phone]  Physical Therapy Treatment/Recert/Discharge (33IR visit)  Patient Details  Name: Todd Rice MRN: [id number] Date of Birth: November 06, 1955 Referring Provider (PT): [name]   PHYSICAL THERAPY DISCHARGE SUMMARY  Visits from Start of Care: 10 (spanning visits 06/14/2020-08/24/2020)  Current functional level related to goals / functional outcomes:  PT Long Term Goals - 08/24/20 0740      PT LONG TERM GOAL #1   Title Pt will independent with progression of HEP to address Parkinson's deficits for improved strength, balance and gait.  TARGET 07/30/2020    Baseline per patient report    Time 6    Period Weeks    Status Achieved      PT LONG TERM GOAL #2   Title Pt will perform 8 of 10 reps of sit<>stand from <18" surface, no UE support, no LOB, for improved functional strength and transfer efficiency.    Time 6    Period Weeks    Status Achieved      PT LONG TERM GOAL #3   Title Pt will improve MiniBESTest to at least 25/28 for decreased fall risk.    Baseline 23/28> 28/28 08/24/2020    Time 6    Period Weeks    Status Achieved      PT LONG TERM GOAL #4   Title Pt will improve gait velocity to at least 3 ft/sec for improved gait efficiency and safety.    Baseline 2.81 ft/sec, 3.71 ft/sec 08/24/2020    Time 6    Period Weeks    Status Achieved      PT LONG TERM GOAL #5   Title Pt will ambulate at least 1000 ft, indoor and outdoor surfaces, independently, no LOB for improved community gait.    Time 6    Period Weeks    Status Achieved          Pt has met all 5 of 5 LTGs.  He improved gait velocity to 3.71 ft/sec, MiniBESTest to 28/28 (from 23/28).   Remaining deficits: Occasional bradykinesia, unsteadiness on feet, abnormal posture; noted improved awareness and self-correction through course of therapy.   Education /  Equipment: HEP, plans for continued community fitness  Plan: Patient agrees to discharge.  Patient goals were met. Patient is being discharged due to meeting the stated rehab goals.  ?????Recommend return PT screens in 6 months due to progressive nature of disease.         Encounter Date: 08/24/2020   PT End of Session - 08/24/20 0800    Visit Number 10    Number of Visits 13    Date for PT Re-Evaluation 63/01/60   *Recert/discharge completed at 08/24/2020 visit   Authorization Type [employer] Medicare-PT/ST combined $30 copay; OT separate $30 copay    Progress Note Due on Visit 10    PT Start Time 0720    PT Stop Time 0754    PT Time Calculation (min) 34 min    Activity Tolerance Patient tolerated treatment well   O2 sats 92-93%, HR 113 bpm after activity   Behavior During Therapy [facility] for tasks assessed/performed           Past Medical History:  Diagnosis Date  . Adult BMI 50.0-59.9 kg/sq m    52.77  . Anal fissure   . Anxiety   . Diabetes mellitus without complication (HCC)    borderline  .  GERD (gastroesophageal reflux disease)    30 years ago, maybe from peptic ulcer  . H/O: gout    STABLE  . Hepatic steatosis 10/01/11  . History of kidney stones   . Hyperlipidemia   . Hypertension   . Kidney tumor    right upper kidney tumor, monitoring  . OSA on CPAP    [facility]  . Parkinson disease ([place]) 01/04/2016  . REM sleep behavior disorder 12/06/2016  . Right ureteral stone   . Ureteral calculus, right 06/12/2011  . Vitamin D deficiency   . Weakness     Past Surgical History:  Procedure Laterality Date  . ANAL FISSURE REPAIR  10-12-2000  . COLONOSCOPY N/A 07/01/2012   Procedure: COLONOSCOPY;  Surgeon: Lafayette Dragon, MD;  Location: [facility];  Service: Endoscopy;  Laterality: N/A;  . CYSTOSCOPY WITH RETROGRADE PYELOGRAM, URETEROSCOPY AND STENT PLACEMENT Bilateral 02/26/2013   Procedure: CYSTOSCOPY WITH RETROGRADE PYELOGRAM, URETEROSCOPY AND STENT PLACEMENT;  Surgeon:  Alexis Frock, MD;  Location: [facility];  Service: Urology;  Laterality: Bilateral;  . HOLMIUM LASER APPLICATION Bilateral 41/74/0814   Procedure: HOLMIUM LASER APPLICATION;  Surgeon: Alexis Frock, MD;  Location: [facility];  Service: Urology;  Laterality: Bilateral;  . kidney stone removal     06/2011-stent placement  . LEFT MEDIAL FEMORAL CONDYLE DEBRIDEMENT & DRILLING/ REMOVAL LOOSE BODY  09-15-2003  . NASAL SEPTUM SURGERY  1985  . STONE EXTRACTION WITH BASKET  06/12/2011   Procedure: STONE EXTRACTION WITH BASKET;  Surgeon: Claybon Jabs, MD;  Location: [facility];  Service: Urology;  Laterality: Right;    There were no vitals filed for this visit.   Subjective Assessment - 08/24/20 0723    Subjective Think I am going to join the gym at [facility].  Today's my last day.    Patient Stated Goals Pt's goals for physical therapy are to want to make sure things continue to go well with walking and balance.    Currently in Pain? No/denies    Pain Onset In the past 7 days                             [facility] Adult PT Treatment/Exercise - 08/24/20 0001      Transfers   Transfers Sit to Stand;Stand to Sit    Sit to Stand 6: Modified independent (Device/Increase time)    Five time sit to stand comments  11.6    Stand to Sit 6: Modified independent (Device/Increase time);Without upper extremity assist;To chair/3-in-1    Number of Reps 10 reps;2 sets   from 18" and 16" surfaces, no UE support     Ambulation/Gait   Ambulation/Gait Yes    Ambulation/Gait Assistance 6: Modified independent (Device/Increase time)    Ambulation Distance (Feet) 1000 Feet    Assistive device None    Gait Pattern Step-through pattern;Decreased arm swing - right;Decreased arm swing - left;Decreased step length - right;Decreased trunk rotation    Ambulation Surface Level;Indoor    Gait velocity 8.84 sec = 3.71 ft/sec      Timed Up and Go Test   TUG Normal TUG;Cognitive TUG    Normal TUG  (seconds) 11.75    Cognitive TUG (seconds) 11.72      Mini-BESTest   Sit To Stand Normal: Comes to stand without use of hands and stabilizes independently.    Rise to Toes Normal: Stable for 3 s with maximum height.    Stand on  one leg (left) Normal: 20 s.    Stand on one leg (right) Normal: 20 s.   7.6, 20.66   Stand on one leg - lowest score 2    Compensatory Stepping Correction - Forward Normal: Recovers independently with a single, large step (second realignement is allowed).    Compensatory Stepping Correction - Backward Normal: Recovers independently with a single, large step    Compensatory Stepping Correction - Left Lateral Normal: Recovers independently with 1 step (crossover or lateral OK)    Compensatory Stepping Correction - Right Lateral Normal: Recovers independently with 1 step (crossover or lateral OK)    Stepping Corredtion Lateral - lowest score 2    Stance - Feet together, eyes open, firm surface  Normal: 30s    Stance - Feet together, eyes closed, foam surface  Normal: 30s    Incline - Eyes Closed Normal: Stands independently 30s and aligns with gravity    Change in Gait Speed Normal: Significantly changes walkling speed without imbalance    Walk with head turns - Horizontal Normal: performs head turns with no change in gait speed and good balance    Walk with pivot turns Normal: Turns with feet close FAST (< 3 steps) with good balance.    Step over obstacles Normal: Able to step over box with minimal change of gait speed and with good balance.    Timed UP & GO with Dual Task Normal: No noticeable change in sitting, standing or walking while backward counting when compared to TUG without    Mini-BEST total score 28      Self-Care   Self-Care Other Self-Care Comments    Other Self-Care Comments  Discussed progress towards goal, POC including d/c this visit.  Discussed plans for continued fitness, and pt is planning to join [employer]; he has already been to visit  and is in agreement for PT to send referral to [employer].                  PT Education - 08/24/20 0800    Education Details Progress towards goals, POC and plans for d/c; return screen in 6 months    Person(s) Educated Patient    Methods Explanation;Handout    Comprehension Verbalized understanding            PT Short Term Goals - 07/27/20 0736      PT SHORT TERM GOAL #1   Title Pt will be independent with HEP for improved balance, transfers, gait.  TARGET 07/16/2020    Time 4    Period Weeks    Status Achieved      PT SHORT TERM GOAL #2   Title Pt will improve 5x sit<>stand score to less than or equal to 11.5 sec for improved transfer efficiency and functional lower extremity strength.    Time 4    Period Weeks    Status Achieved      PT SHORT TERM GOAL #3   Title Pt will improve TUG and TUG cognitive score to less than or equal to 10% difference for improved ability with dual tasking with gait.    Baseline 13.09, 15.25    Time 4    Period Weeks    Status Achieved             PT Long Term Goals - 08/24/20 0740      PT LONG TERM GOAL #1   Title Pt will independent with progression of HEP to address Parkinson's deficits for improved  strength, balance and gait.  TARGET 07/30/2020    Baseline per patient report    Time 6    Period Weeks    Status Achieved      PT LONG TERM GOAL #2   Title Pt will perform 8 of 10 reps of sit<>stand from <18" surface, no UE support, no LOB, for improved functional strength and transfer efficiency.    Time 6    Period Weeks    Status Achieved      PT LONG TERM GOAL #3   Title Pt will improve MiniBESTest to at least 25/28 for decreased fall risk.    Baseline 23/28> 28/28 08/24/2020    Time 6    Period Weeks    Status Achieved      PT LONG TERM GOAL #4   Title Pt will improve gait velocity to at least 3 ft/sec for improved gait efficiency and safety.    Baseline 2.81 ft/sec, 3.71 ft/sec 08/24/2020    Time 6    Period  Weeks    Status Achieved      PT LONG TERM GOAL #5   Title Pt will ambulate at least 1000 ft, indoor and outdoor surfaces, independently, no LOB for improved community gait.    Time 6    Period Weeks    Status Achieved                 Plan - 08/24/20 0801    Clinical Impression Statement Pt has not been seen for final discharge visit, as pt was last seen on 08/03/2020; primary PT was out of clinic due to death in family.  Pt returns to PT today for discharge visit.  LTGs checked, with pt meeting all LTGs.  He is reporting performing HEP at least 3x/wk and walking for exercise several times per week with his wife.  He has gone to visit [employer] and plans to join post discharge for continued aerobic activity and use of walking track.  Pt is appropriate for d/c this visit.    Personal Factors and Comorbidities Comorbidity 3+    Comorbidities See PMH list    Examination-Activity Limitations Locomotion Level;Transfers;Stairs;Stand    Examination-Participation Restrictions Community Activity;Other   walking dog in neighborhood   Stability/Clinical Decision Making Evolving/Moderate complexity    Rehab Potential Good    PT Frequency Other (comment)   additional 1 visit 08/24/2020   PT Duration --    PT Treatment/Interventions ADLs/Self Care Home Management;Neuromuscular re-education;Balance training;Therapeutic exercise;Therapeutic activities;Functional mobility training;Stair training;Gait training;Patient/family education    PT Next Visit Plan Discharge this visit; plan for return screen in 6 months.    Consulted and Agree with Plan of Care Patient           Patient will benefit from skilled therapeutic intervention in order to improve the following deficits and impairments:  Abnormal gait,Difficulty walking,Impaired tone,Decreased balance,Decreased mobility,Decreased strength,Postural dysfunction  Visit Diagnosis: Other abnormalities of gait and mobility  Other  symptoms and signs involving the nervous system  Unsteadiness on feet     Problem List Patient Active Problem List   Diagnosis Date Noted  . Primary renal papillary carcinoma, right ([place]) 02/18/2020  . Hepatic steatosis 02/18/2020  . Major depression in remission ([place]) 11/11/2019  . Central sleep apnea due to Cheyne-Stokes respiration 08/31/2019  . Elevated PSA 08/13/2019  . Hyperlipidemia associated with type 2 diabetes mellitus ([place]) 07/09/2019  . Aortic atherosclerosis ([place]) 06/10/2018  . Controlled type 2 diabetes mellitus with diabetic dermatitis,  without long-term current use of insulin ([place]) 01/21/2018  . OSA treated with BiPAP 01/03/2017  . REM sleep behavior disorder 12/06/2016  . CKD stage 3 due to type 2 diabetes mellitus ([place]) 10/23/2016  . Medication management 10/23/2016  . Vitamin D deficiency 10/23/2016  . Parkinson's disease ([place]) 01/04/2016  . Seasonal and perennial allergic rhinitis 06/21/2014  . Asthma, mild intermittent, well-controlled 06/21/2014  . Pseudomonas aeruginosa colonization 10/08/2012  . CAD (coronary artery disease) 11/15/2011  . Hypertension 11/15/2011  . Morbid obesity with BMI of 40.0-44.9, adult ([place]) 11/15/2011    Perle Gibbon W. 08/24/2020, 8:05 AM  Frazier Butt., PT   [facility] [address] [place], [place], [address] Phone: [phone]   Fax:  [phone]  Name: Todd Rice MRN: [id number] Date of Birth: 13-May-1955

## 2020-08-25 ENCOUNTER — Other Ambulatory Visit (HOSPITAL_COMMUNITY): Payer: Self-pay

## 2020-08-30 DIAGNOSIS — D4101 Neoplasm of uncertain behavior of right kidney: Secondary | ICD-10-CM | POA: Diagnosis not present

## 2020-08-31 ENCOUNTER — Other Ambulatory Visit: Payer: Self-pay

## 2020-08-31 ENCOUNTER — Ambulatory Visit: Payer: Medicare Other | Admitting: Neurology

## 2020-08-31 ENCOUNTER — Encounter: Payer: Self-pay | Admitting: Neurology

## 2020-08-31 ENCOUNTER — Other Ambulatory Visit (HOSPITAL_COMMUNITY): Payer: Self-pay

## 2020-08-31 VITALS — BP 118/73 | HR 65 | Ht 67.0 in | Wt 267.0 lb

## 2020-08-31 DIAGNOSIS — G4752 REM sleep behavior disorder: Secondary | ICD-10-CM

## 2020-08-31 DIAGNOSIS — G20 Parkinson's disease: Secondary | ICD-10-CM

## 2020-08-31 MED ORDER — PRAMIPEXOLE DIHYDROCHLORIDE 1 MG PO TABS
1.0000 mg | ORAL_TABLET | Freq: Three times a day (TID) | ORAL | 3 refills | Status: DC
  Filled 2020-08-31 – 2021-06-05 (×2): qty 270, 90d supply, fill #0

## 2020-08-31 MED ORDER — CARBIDOPA-LEVODOPA 25-250 MG PO TABS
1.0000 | ORAL_TABLET | Freq: Three times a day (TID) | ORAL | 3 refills | Status: DC
  Filled 2020-08-31: qty 270, 90d supply, fill #0
  Filled 2020-11-29: qty 270, 90d supply, fill #1
  Filled 2021-03-06: qty 270, 90d supply, fill #0
  Filled 2021-06-05: qty 270, 90d supply, fill #1

## 2020-08-31 NOTE — Progress Notes (Signed)
Reason for visit: Parkinson's disease  Todd Rice is an 65 y.o. male  History of present illness:  Todd Rice is a 65 year old right-handed white male with a history of Parkinson's disease.  He also has a history of hypertension, obesity, diabetes, dyslipidemia, and sleep apnea on CPAP.  The patient has been involved actively with rehab, he indicates that since last seen he has had fewer "Parkinson's days".  By this he means that he has fewer days that are associated with slower movements and days that he has to think about initiating movements or activities.  The patient has a tremor involving the right upper extremity that is worse with stress.  In general, he is sleeping fairly well at night.  The has fairly good balance, he has not had any falls.  He has developed a jaw thrusting movement issue, not a true tremor with the jaw.  He denies any problems with swallowing.  He denies any dyskinesias.  He has not had any lapses in coverage of his medication, he has a smooth response to his medication throughout the day.  He returns for further evaluation.  Past Medical History:  Diagnosis Date  . Adult BMI 50.0-59.9 kg/sq m    52.77  . Anal fissure   . Anxiety   . Diabetes mellitus without complication (HCC)    borderline  . GERD (gastroesophageal reflux disease)    30 years ago, maybe from peptic ulcer  . H/O: gout    STABLE  . Hepatic steatosis 10/01/11  . History of kidney stones   . Hyperlipidemia   . Hypertension   . Kidney tumor    right upper kidney tumor, monitoring  . OSA on CPAP    [facility]  . Parkinson disease ([place]) 01/04/2016  . REM sleep behavior disorder 12/06/2016  . Right ureteral stone   . Ureteral calculus, right 06/12/2011  . Vitamin D deficiency   . Weakness     Past Surgical History:  Procedure Laterality Date  . ANAL FISSURE REPAIR  10-12-2000  . COLONOSCOPY N/A 07/01/2012   Procedure: COLONOSCOPY;  Surgeon: Lafayette Dragon, MD;  [facility];  Service: Endoscopy;  Laterality: N/A;  . CYSTOSCOPY WITH RETROGRADE PYELOGRAM, URETEROSCOPY AND STENT PLACEMENT Bilateral 02/26/2013   Procedure: CYSTOSCOPY WITH RETROGRADE PYELOGRAM, URETEROSCOPY AND STENT PLACEMENT;  Surgeon: Alexis Frock, MD;  [facility];  Service: Urology;  Laterality: Bilateral;  . HOLMIUM LASER APPLICATION Bilateral 79/06/4095   Procedure: HOLMIUM LASER APPLICATION;  Surgeon: Alexis Frock, MD;  Location: [facility];  Service: Urology;  Laterality: Bilateral;  . kidney stone removal     06/2011-stent placement  . LEFT MEDIAL FEMORAL CONDYLE DEBRIDEMENT & DRILLING/ REMOVAL LOOSE BODY  09-15-2003  . NASAL SEPTUM SURGERY  1985  . STONE EXTRACTION WITH BASKET  06/12/2011   Procedure: STONE EXTRACTION WITH BASKET;  Surgeon: Claybon Jabs, MD;  Location: [facility];  Service: Urology;  Laterality: Right;    Family History  Problem Relation Age of Onset  . Heart disease Mother        Atrial fibrillation  . Hypertension Mother   . Hyperlipidemia Mother   . Lung cancer Father        was a smoker  . Cancer Father        lung  . Hyperlipidemia Brother   . Heart disease Maternal Aunt   . Hyperlipidemia Maternal Aunt   . Hypertension Maternal Aunt   . Stroke Maternal Aunt   .  Heart disease Paternal Grandmother   . Hyperlipidemia Paternal Grandmother     Social history:  reports that he has quit smoking. He has never used smokeless tobacco. He reports that he does not drink alcohol and does not use drugs.    Allergies  Allergen Reactions  . Ciprofloxacin Hives  . Ceftriaxone Hives    Medications:  Prior to Admission medications   Medication Sig Start Date End Date Taking? Authorizing Provider  albuterol (VENTOLIN HFA) 108 (90 Base) MCG/ACT inhaler INHALE 2 PUFFS EVERY 4 HOURS (15 MINUTES APART) FOR ASTHMA RESCUE 03/01/20 03/01/21 Yes Unk Pinto, MD  allopurinol (ZYLOPRIM) 300 MG tablet TAKE 1 TABLET BY MOUTH ONCE A DAY TO TO  PREVENT GOUT 08/06/20 08/06/21 Yes [name], NP  ALPRAZolam Duanne Moron) 0.5 MG tablet TAKE 1 TABLET BY MOUTH 3 TIMES DAILY AS NEEDED 08/06/20 02/02/21 Yes [name], NP  aspirin EC 81 MG tablet Take 81 mg by mouth at bedtime.   Yes [provider]  carbidopa-levodopa (SINEMET IR) 25-250 MG tablet TAKE 1 TABLET BY MOUTH 3 TIMES DAILY 09/15/19 09/14/20 Yes Kathrynn Ducking, MD  COVID-19 mRNA vaccine, Pfizer, 30 MCG/0.3ML injection Inject into the muscle. 08/17/20  Yes Carlyle Basques, MD  ezetimibe (ZETIA) 10 MG tablet Take 1 tablet (10 mg total) by mouth daily. 11/14/19 11/13/20 Yes Vladimir Crofts, PA-C  JANUVIA 100 MG tablet TAKE 1 TABLET BY MOUTH ONCE A DAY 05/18/20 05/18/21 Yes [name], NP  metFORMIN (GLUCOPHAGE-XR) 500 MG 24 hr tablet TAKE 2 TABLETS BY MOUTH 2 TIMES DAILY 06/30/20 06/30/21 Yes Unk Pinto, MD  mometasone (NASONEX) 50 MCG/ACT nasal spray PLACE 2 SPRAYS INTO THE NOSE DAILY. 08/02/20 08/02/21 Yes McClanahan, Danton Sewer, NP  pramipexole (MIRAPEX) 1 MG tablet Take 1 tablet (1 mg total) by mouth 3 (three) times daily. 03/18/19  Yes Kathrynn Ducking, MD  rosuvastatin (CRESTOR) 40 MG tablet Take 1 tablet (40 mg total) by mouth daily. 11/12/19  Yes Vicie Mutters R, PA-C  valsartan-hydrochlorothiazide (DIOVAN-HCT) 320-25 MG tablet TAKE 1 TABLET BY MOUTH ONCE A DAY FOR BLOOD PRESSURE 08/24/20 08/24/21 Yes Unk Pinto, MD  vitamin C (ASCORBIC ACID) 500 MG tablet Take 1 tablet (500 mg total) by mouth 3 (three) times daily. 01/16/17  Yes [name], NP  VITAMIN D PO Take 5,000 Units by mouth 2 (two) times daily.   Yes [provider]  zolpidem (AMBIEN) 5 MG tablet Take 1 tablet (5 mg total) by mouth at bedtime as needed for sleep. 07/30/19  Yes Dohmeier, Asencion Partridge, MD  glipiZIDE (GLUCOTROL) 5 MG tablet Take 1 tablet (5 mg total) by mouth 2 (two) times daily before a meal. 10/07/18 10/07/19  Vladimir Crofts, PA-C    ROS:  Out of a complete 14 system review of symptoms,  the patient complains only of the following symptoms, and all other reviewed systems are negative.  Tremor Jaw thrust   Blood pressure 118/73, pulse 65, height 5\' 7"  (1.702 m), weight 267 lb (121.1 kg).  Physical Exam  General: The patient is alert and cooperative at the time of the examination.  The patient is markedly obese.  Skin: No significant peripheral edema is noted.   Neurologic Exam  Mental status: The patient is alert and oriented x 3 at the time of the examination. The patient has apparent normal recent and remote memory, with an apparently normal attention span and concentration ability.   Cranial nerves: Facial symmetry is present. Speech is normal, no aphasia or dysarthria is noted.  Extraocular movements are full. Visual fields are full.  Masking of the face is seen.  With downward gaze, lid lag is noted.  Motor: The patient has good strength in all 4 extremities.  Sensory examination: Soft touch sensation is symmetric on the face, arms, and legs.  Coordination: The patient has good finger-nose-finger and heel-to-shin bilaterally.  The patient has a prominent right upper extremity resting tremor at times.  Gait and station: The patient is able to arise from a seated position with arms crossed.  Once up, he can ambulate independently with fairly symmetric arm swing.  The patient has good turns and good stride.  Romberg is negative.  Tandem gait is unremarkable.  Reflexes: Deep tendon reflexes are symmetric.   Assessment/Plan:  1.  Parkinson's disease  The patient seems to be relatively stable with his Parkinson's disease.  He has engaged in rehabilitation which has been helpful for him.  We will keep his current medications as is with the Sinemet and the Mirapex.  The patient will follow up in 5 months.  He will call for any dose adjustments.  Prescriptions were sent in for his medications.  Jill Alexanders MD 08/31/2020 7:17 AM  [facility] [address] [place],  [address]  Phone [phone] Fax [phone]

## 2020-09-01 ENCOUNTER — Other Ambulatory Visit (HOSPITAL_COMMUNITY): Payer: Self-pay

## 2020-09-02 DIAGNOSIS — N200 Calculus of kidney: Secondary | ICD-10-CM | POA: Diagnosis not present

## 2020-09-02 DIAGNOSIS — K8020 Calculus of gallbladder without cholecystitis without obstruction: Secondary | ICD-10-CM | POA: Diagnosis not present

## 2020-09-02 DIAGNOSIS — N2889 Other specified disorders of kidney and ureter: Secondary | ICD-10-CM | POA: Diagnosis not present

## 2020-09-02 DIAGNOSIS — D4101 Neoplasm of uncertain behavior of right kidney: Secondary | ICD-10-CM | POA: Diagnosis not present

## 2020-09-02 DIAGNOSIS — N281 Cyst of kidney, acquired: Secondary | ICD-10-CM | POA: Diagnosis not present

## 2020-09-06 DIAGNOSIS — D4101 Neoplasm of uncertain behavior of right kidney: Secondary | ICD-10-CM | POA: Diagnosis not present

## 2020-09-06 DIAGNOSIS — N3020 Other chronic cystitis without hematuria: Secondary | ICD-10-CM | POA: Diagnosis not present

## 2020-09-06 DIAGNOSIS — N200 Calculus of kidney: Secondary | ICD-10-CM | POA: Diagnosis not present

## 2020-09-10 ENCOUNTER — Telehealth: Payer: Self-pay

## 2020-09-10 DIAGNOSIS — K862 Cyst of pancreas: Secondary | ICD-10-CM

## 2020-09-10 DIAGNOSIS — K869 Disease of pancreas, unspecified: Secondary | ICD-10-CM

## 2020-09-10 NOTE — Telephone Encounter (Signed)
Spoke with the pt and he agrees to repeat MRI at [place] with open machine early morning. [facility] will call the pt to make appt.

## 2020-09-10 NOTE — Telephone Encounter (Signed)
-----   Message from Timothy Lasso, RN sent at 09/11/2018  9:02 AM EDT ----- He needs MRI of the pancreas in 2 years for pancreatic cyst

## 2020-09-22 ENCOUNTER — Other Ambulatory Visit: Payer: Self-pay

## 2020-09-22 ENCOUNTER — Ambulatory Visit
Admission: RE | Admit: 2020-09-22 | Discharge: 2020-09-22 | Disposition: A | Discharge status: Home / Self Care | Payer: Medicare Other | Source: Ambulatory Visit | Attending: Gastroenterology | Admitting: Gastroenterology

## 2020-09-22 DIAGNOSIS — K8020 Calculus of gallbladder without cholecystitis without obstruction: Secondary | ICD-10-CM | POA: Diagnosis not present

## 2020-09-22 DIAGNOSIS — N2889 Other specified disorders of kidney and ureter: Secondary | ICD-10-CM | POA: Diagnosis not present

## 2020-09-22 DIAGNOSIS — K760 Fatty (change of) liver, not elsewhere classified: Secondary | ICD-10-CM | POA: Diagnosis not present

## 2020-09-22 DIAGNOSIS — K862 Cyst of pancreas: Secondary | ICD-10-CM | POA: Diagnosis not present

## 2020-09-22 MED ORDER — GADOBENATE DIMEGLUMINE 529 MG/ML IV SOLN
20.0000 mL | Freq: Once | INTRAVENOUS | Status: AC | PRN
  Administered 2020-09-22: 20 mL via INTRAVENOUS

## 2020-09-26 NOTE — Progress Notes (Signed)
Future Appointments  Date Time Provider [place]  09/27/2020  9:30 AM Unk Pinto, MD [facility] None  10/05/2020  8:30 AM Dohmeier, Asencion Partridge, MD [facility] None  01/31/2021  2:00 PM Kathrynn Ducking, MD [facility] None  02/17/2021  2:00 PM [name], NP [facility] None  05/30/2021 11:00 AM [name], NP [facility] None    History of Present Illness:       This very nice 65 y.o. MWM presents for 3 month follow up with HTN, HLD, T2_NIDDM, OSA/BiPAP, Parkinson's Dz (2018)  and Vitamin D Deficiency. Patient's Gout is controlled on his Allopurinol /Colchicine. Dr Dohmeier follows patient for OSA on CPAP and Dr [name] follows patient for Parkinson's (2017). Also CXR in 2017 showed Aortic AS.        Patient is treated for HTN since 1998 & BP has been controlled.  Today's BP is at goal - 138/80. Patient has had no complaints of any cardiac type chest pain, palpitations, dyspnea / orthopnea / PND, dizziness, claudication, or dependent edema.       Hyperlipidemia is not controlled with diet & Rosuvastatin /Ezetimibe (? Compliance).  Patient denies myalgias or other med SE's. Last Lipids were not at goal:  Lab Results  Component Value Date   CHOL 175 05/27/2020   HDL 31 (L) 05/27/2020   LDLCALC 103 (H) 05/27/2020   TRIG 306 (H) 05/27/2020   CHOLHDL 5.6 (H) 05/27/2020    Also, the patient has Morbid Obesity (BMI  41.8+) and consequent T2_NIDDM (2013) w/CKD3a  (GFR 58)  and has had no symptoms of reactive hypoglycemia, diabetic polys, paresthesias or visual blurring.  Last A1c was not at goal:  Lab Results  Component Value Date   HGBA1C 6.8 (H) 05/27/2020        Further, the patient also has history of Vitamin D Deficiency ("18" /2010 & "28" /2017)and supplements vitamin D without any suspected side-effects. Last vitamin D was near goal (goal 70-100):  Lab Results  Component Value Date   VD25OH 54 02/18/2020    Current Outpatient Medications on File Prior to  Visit  Medication Sig  . VENTOLIN HFA  inhaler I2 PUFFS EVERY 4 HRS FOR ASTHMA RESCUE  . allopurinol (300 MG tablet TAKE 1 TABLET  ONCE A DAY   . ALPRAZolam  0.5 MG tablet TAKE 1 TABLET 3 TIMES DAILY AS NEEDED  . aspirin EC 81 MG tablet Take 8 at bedtime.  Marland Kitchen SINEMET IR 25-250 MG tablet TAKE 1 TABLET BY MOUTH 3 TIMES DAILY  . ezetimibe  10 MG  Take 1 tablet (10 mg total) by mouth daily.  Marland Kitchen JANUVIA 100 MG tablet TAKE 1 TABLET BY MOUTH ONCE A DAY  . metFORMIN-XR) 500 MG  TAKE 2 TABLETS BY MOUTH 2 TIMES DAILY  . NASONEX  nasal spray PLACE 2 SPRAYS INTO THE NOSE DAILY.  Marland Kitchen MIRAPEX 1 MG tablet Take 1 tablet (1 mg total) by mouth 3 (three) times daily.  . rosuvastatin ) 40 MG tablet Take 1 tablet (40 mg total) by mouth daily.  . valsartan-hctz 320-25 MG TAKE 1 TABLET  ONCE A DAY   . vitamin C  500 MG tablet Take 1 tablet  3  times daily.  Marland Kitchen VITAMIN D  5,000 Units Take by mouth 2  times daily.  Marland Kitchen zolpidem (AMBIEN) 5 MG  Take 1 tablet  at bedtime as needed for sleep.  Marland Kitchen glipiZIDE  5 MG tablet Take 1 tablet 2  times daily before a meal.     Allergies  Allergen Reactions  . Ciprofloxacin Hives  . Ceftriaxone Hives    PMHx:   Past Medical History:  Diagnosis Date  . Adult BMI 50.0-59.9 kg/sq m    52.77  . Anal fissure   . Anxiety   . Diabetes mellitus without complication (HCC)    borderline  . GERD (gastroesophageal reflux disease)    30 years ago, maybe from peptic ulcer  . H/O: gout    STABLE  . Hepatic steatosis 10/01/11  . History of kidney stones   . Hyperlipidemia   . Hypertension   . Kidney tumor    right upper kidney tumor, monitoring  . OSA on CPAP    [facility]  . Parkinson disease ([place]) 01/04/2016  . REM sleep behavior disorder 12/06/2016  . Right ureteral stone   . Ureteral calculus, right 06/12/2011  . Vitamin D deficiency   . Weakness     Immunization History  Administered Date(s) Administered  . Influenza Inj Mdck Quad With Preservative 02/22/2017, 01/21/2018,  02/06/2019  . Influenza, High Dose Seasonal PF 02/18/2020  . Influenza-Unspecified 02/03/2013, 02/05/2014  . PFIZER Comirnaty(Gray Top)Covid-19 Tri-Sucrose Vaccine 08/17/2020  . PFIZER(Purple Top)SARS-COV-2 Vaccination 06/14/2019, 07/09/2019, 03/06/2020  . Pneumococcal Conjugate-13 02/18/2020  . Pneumococcal-Unspecified 11/05/2012  . Tdap 03/26/2015    Past Surgical History:  Procedure Laterality Date  . ANAL FISSURE REPAIR  10-12-2000  . COLONOSCOPY N/A 07/01/2012   Procedure: COLONOSCOPY;  Surgeon: Lafayette Dragon, MD;  Location: [facility];  Service: Endoscopy;  Laterality: N/A;  . CYSTOSCOPY WITH RETROGRADE PYELOGRAM, URETEROSCOPY AND STENT PLACEMENT Bilateral 02/26/2013   Procedure: CYSTOSCOPY WITH RETROGRADE PYELOGRAM, URETEROSCOPY AND STENT PLACEMENT;  Surgeon: Alexis Frock, MD;  Location: [facility];  Service: Urology;  Laterality: Bilateral;  . HOLMIUM LASER APPLICATION Bilateral 44/05/270   Procedure: HOLMIUM LASER APPLICATION;  Surgeon: Alexis Frock, MD;  Location: [facility];  Service: Urology;  Laterality: Bilateral;  . kidney stone removal     06/2011-stent placement  . LEFT MEDIAL FEMORAL CONDYLE DEBRIDEMENT & DRILLING/ REMOVAL LOOSE BODY  09-15-2003  . NASAL SEPTUM SURGERY  1985  . STONE EXTRACTION WITH BASKET  06/12/2011   Procedure: STONE EXTRACTION WITH BASKET;  Surgeon: Claybon Jabs, MD;  Location[facility];  Service: Urology;  Laterality: Right;    FHx:    Reviewed / unchanged  SHx:    Reviewed / unchanged   Systems Review:  Constitutional: Denies fever, chills, wt changes, headaches, insomnia, fatigue, night sweats, change in appetite. Eyes: Denies redness, blurred vision, diplopia, discharge, itchy, watery eyes.  ENT: Denies discharge, congestion, post nasal drip, epistaxis, sore throat, earache, hearing loss, dental pain, tinnitus, vertigo, sinus pain, snoring.  CV: Denies chest pain, palpitations, irregular heartbeat, syncope, dyspnea,  diaphoresis, orthopnea, PND, claudication or edema. Respiratory: denies cough, dyspnea, DOE, pleurisy, hoarseness, laryngitis, wheezing.  Gastrointestinal: Denies dysphagia, odynophagia, heartburn, reflux, water brash, abdominal pain or cramps, nausea, vomiting, bloating, diarrhea, constipation, hematemesis, melena, hematochezia  or hemorrhoids. Genitourinary: Denies dysuria, frequency, urgency, nocturia, hesitancy, discharge, hematuria or flank pain. Musculoskeletal: Denies arthralgias, myalgias, stiffness, jt. swelling, pain, limping or strain/sprain.  Skin: Denies pruritus, rash, hives, warts, acne, eczema or change in skin lesion(s). Neuro: No weakness, tremor, incoordination, spasms, paresthesia or pain. Psychiatric: Denies confusion, memory loss or sensory loss. Endo: Denies change in weight, skin or hair change.  Heme/Lymph: No excessive bleeding, bruising or enlarged lymph nodes.  Physical Exam  BP 138/80  Pulse 71   Temp 97.9 F (36.6 C)   Resp 18   Ht 5\' 7"  (1.702 m)   Wt 266 lb 9.6 oz (120.9 kg)   SpO2 99%   BMI 41.76 kg/m   Appears  well nourished, well groomed  and in no distress.  Eyes: PERRLA, EOMs, conjunctiva no swelling or erythema. Sinuses: No frontal/maxillary tenderness ENT/Mouth: EAC's clear, TM's nl w/o erythema, bulging. Nares clear w/o erythema, swelling, exudates. Oropharynx clear without erythema or exudates. Oral hygiene is good. Tongue normal, non obstructing. Hearing intact.  Neck: Supple. Thyroid not palpable. Car 2+/2+ without bruits, nodes or JVD. Chest: Respirations nl with BS clear & equal w/o rales, rhonchi, wheezing or stridor.  Cor: Heart sounds normal w/ regular rate and rhythm without sig. murmurs, gallops, clicks or rubs. Peripheral pulses normal and equal  without edema.  Abdomen: Soft & bowel sounds normal. Non-tender w/o guarding, rebound, hernias, masses or organomegaly.  Lymphatics: Unremarkable.  Musculoskeletal: Full ROM all  peripheral extremities, joint stability, 5/5 strength and normal  gait with decreased arm swing.No cog wheeling. Skin: Warm, dry without exposed rashes, lesions or ecchymosis apparent.  Neuro: Cranial nerves intact, reflexes equal bilaterally. Sensory-motor testing grossly intact. Tendon reflexes grossly intact.  Pysch: Alert & oriented x 3.  Insight and judgement nl & appropriate. No ideations.  Assessment and Plan:  1. Essential hypertension  - Continue medication, monitor blood pressure at home.  - Continue DASH diet.  Reminder to go to the ER if any CP,  SOB, nausea, dizziness, severe HA, changes vision/speech.  - CBC with Differential/Platelet - COMPLETE METABOLIC PANEL WITH GFR - Magnesium - TSH  2. Hyperlipidemia associated with type 2 diabetes mellitus ([place])  - Continue diet/meds, exercise,& lifestyle modifications.  - Continue monitor periodic cholesterol/liver & renal functions   - Lipid panel - TSH  3. Type 2 diabetes mellitus with stage 3a chronic kidney  disease, without long-term current use of insulin (HCC)  - Continue diet, exercise  - Lifestyle modifications.  - Monitor appropriate labs  - Hemoglobin A1c - Insulin, random  4. Vitamin D deficiency  - Continue supplementation.  - VITAMIN D 25 Hydroxy  5. Aortic atherosclerosis ([place]) by CXR on 06/11/2015  - Lipid panel  6. OSA treated with BiPAP   7. Parkinson disease ([place])   8. Medication management  - CBC with Differential/Platelet - COMPLETE METABOLIC PANEL WITH GFR - Magnesium - Lipid panel - TSH - Hemoglobin A1c - Insulin, random - VITAMIN D 25 Hydroxy        Discussed  regular exercise, BP monitoring, weight control to achieve/maintain BMI less than 25 and discussed med and SE's. Recommended labs to assess and monitor clinical status with further disposition pending results of labs.  I discussed the assessment and treatment plan with the patient. The patient was provided an  opportunity to ask questions and all were answered. The patient agreed with the plan and demonstrated an understanding of the instructions.  I provided over 30 minutes of exam, counseling, chart review and  complex critical decision making.        The patient was advised to call back or seek an in-person evaluation if the symptoms worsen or if the condition fails to improve as anticipated.   Kirtland Bouchard, MD

## 2020-09-27 ENCOUNTER — Other Ambulatory Visit (HOSPITAL_COMMUNITY): Payer: Self-pay

## 2020-09-27 ENCOUNTER — Ambulatory Visit (INDEPENDENT_AMBULATORY_CARE_PROVIDER_SITE_OTHER): Payer: Medicare Other | Admitting: Internal Medicine

## 2020-09-27 ENCOUNTER — Other Ambulatory Visit: Payer: Self-pay | Admitting: Internal Medicine

## 2020-09-27 ENCOUNTER — Other Ambulatory Visit (HOSPITAL_BASED_OUTPATIENT_CLINIC_OR_DEPARTMENT_OTHER): Payer: Self-pay

## 2020-09-27 ENCOUNTER — Other Ambulatory Visit: Payer: Self-pay

## 2020-09-27 VITALS — BP 138/80 | HR 71 | Temp 97.9°F | Resp 18 | Ht 67.0 in | Wt 266.6 lb

## 2020-09-27 DIAGNOSIS — E1169 Type 2 diabetes mellitus with other specified complication: Secondary | ICD-10-CM

## 2020-09-27 DIAGNOSIS — E1122 Type 2 diabetes mellitus with diabetic chronic kidney disease: Secondary | ICD-10-CM | POA: Diagnosis not present

## 2020-09-27 DIAGNOSIS — Z79899 Other long term (current) drug therapy: Secondary | ICD-10-CM

## 2020-09-27 DIAGNOSIS — G4733 Obstructive sleep apnea (adult) (pediatric): Secondary | ICD-10-CM

## 2020-09-27 DIAGNOSIS — I700 Atherosclerosis of aorta: Secondary | ICD-10-CM

## 2020-09-27 DIAGNOSIS — N1831 Chronic kidney disease, stage 3a: Secondary | ICD-10-CM

## 2020-09-27 DIAGNOSIS — G20 Parkinson's disease: Secondary | ICD-10-CM | POA: Diagnosis not present

## 2020-09-27 DIAGNOSIS — E559 Vitamin D deficiency, unspecified: Secondary | ICD-10-CM

## 2020-09-27 DIAGNOSIS — E785 Hyperlipidemia, unspecified: Secondary | ICD-10-CM | POA: Diagnosis not present

## 2020-09-27 DIAGNOSIS — G20A1 Parkinson's disease without dyskinesia, without mention of fluctuations: Secondary | ICD-10-CM

## 2020-09-27 DIAGNOSIS — E119 Type 2 diabetes mellitus without complications: Secondary | ICD-10-CM

## 2020-09-27 DIAGNOSIS — I10 Essential (primary) hypertension: Secondary | ICD-10-CM

## 2020-09-27 MED ORDER — METFORMIN HCL ER 500 MG PO TB24
ORAL_TABLET | Freq: Two times a day (BID) | ORAL | 3 refills | Status: DC
  Filled 2020-09-27: qty 360, 90d supply, fill #0
  Filled 2020-12-29: qty 360, 90d supply, fill #1
  Filled 2021-03-27: qty 360, 90d supply, fill #0
  Filled 2021-06-23: qty 360, 90d supply, fill #1

## 2020-09-27 NOTE — Patient Instructions (Signed)

## 2020-09-28 LAB — CBC WITH DIFFERENTIAL/PLATELET
Absolute Monocytes: 570 cells/uL (ref 200–950)
Basophils Absolute: 37 cells/uL (ref 0–200)
Basophils Relative: 0.5 %
Eosinophils Absolute: 96 cells/uL (ref 15–500)
Eosinophils Relative: 1.3 %
HCT: 45.5 % (ref 38.5–50.0)
Hemoglobin: 15.4 g/dL (ref 13.2–17.1)
Lymphs Abs: 2161 cells/uL (ref 850–3900)
MCH: 28.8 pg (ref 27.0–33.0)
MCHC: 33.8 g/dL (ref 32.0–36.0)
MCV: 85 fL (ref 80.0–100.0)
MPV: 11.1 fL (ref 7.5–12.5)
Monocytes Relative: 7.7 %
Neutro Abs: 4536 cells/uL (ref 1500–7800)
Neutrophils Relative %: 61.3 %
Platelets: 161 10*3/uL (ref 140–400)
RBC: 5.35 10*6/uL (ref 4.20–5.80)
RDW: 14.2 % (ref 11.0–15.0)
Total Lymphocyte: 29.2 %
WBC: 7.4 10*3/uL (ref 3.8–10.8)

## 2020-09-28 LAB — COMPLETE METABOLIC PANEL WITH GFR
AG Ratio: 1.2 (calc) (ref 1.0–2.5)
ALT: 18 U/L (ref 9–46)
AST: 16 U/L (ref 10–35)
Albumin: 4.2 g/dL (ref 3.6–5.1)
Alkaline phosphatase (APISO): 50 U/L (ref 35–144)
BUN/Creatinine Ratio: 17 (calc) (ref 6–22)
BUN: 22 mg/dL (ref 7–25)
CO2: 27 mmol/L (ref 20–32)
Calcium: 10.8 mg/dL — ABNORMAL HIGH (ref 8.6–10.3)
Chloride: 100 mmol/L (ref 98–110)
Creat: 1.27 mg/dL — ABNORMAL HIGH (ref 0.70–1.25)
GFR, Est African American: 68 mL/min/{1.73_m2} (ref >=60)
GFR, Est Non African American: 58 mL/min/{1.73_m2} — ABNORMAL LOW (ref >=60)
Globulin: 3.4 g/dL (calc) (ref 1.9–3.7)
Glucose, Bld: 112 mg/dL — ABNORMAL HIGH (ref 65–99)
Potassium: 4.1 mmol/L (ref 3.5–5.3)
Sodium: 137 mmol/L (ref 135–146)
Total Bilirubin: 0.4 mg/dL (ref 0.2–1.2)
Total Protein: 7.6 g/dL (ref 6.1–8.1)

## 2020-09-28 LAB — INSULIN, RANDOM: Insulin: 30.7 u[IU]/mL — ABNORMAL HIGH

## 2020-09-28 LAB — LIPID PANEL
Cholesterol: 202 mg/dL — ABNORMAL HIGH (ref <200)
HDL: 32 mg/dL — ABNORMAL LOW (ref >=40)
Non-HDL Cholesterol (Calc): 170 mg/dL (calc) — ABNORMAL HIGH (ref <130)
Total CHOL/HDL Ratio: 6.3 (calc) — ABNORMAL HIGH (ref <5.0)
Triglycerides: 485 mg/dL — ABNORMAL HIGH (ref <150)

## 2020-09-28 LAB — HEMOGLOBIN A1C
Hgb A1c MFr Bld: 7.1 % of total Hgb — ABNORMAL HIGH (ref <5.7)
Mean Plasma Glucose: 157 mg/dL
eAG (mmol/L): 8.7 mmol/L

## 2020-09-28 LAB — TSH: TSH: 4.29 mIU/L (ref 0.40–4.50)

## 2020-09-28 LAB — VITAMIN D 25 HYDROXY (VIT D DEFICIENCY, FRACTURES): Vit D, 25-Hydroxy: 84 ng/mL (ref 30–100)

## 2020-09-28 LAB — MAGNESIUM: Magnesium: 1.7 mg/dL (ref 1.5–2.5)

## 2020-09-29 ENCOUNTER — Encounter: Payer: Self-pay | Admitting: Neurology

## 2020-10-03 ENCOUNTER — Encounter: Payer: Self-pay | Admitting: Internal Medicine

## 2020-10-05 ENCOUNTER — Encounter: Payer: Self-pay | Admitting: Neurology

## 2020-10-05 ENCOUNTER — Ambulatory Visit: Payer: Medicare Other | Admitting: Neurology

## 2020-10-05 ENCOUNTER — Other Ambulatory Visit: Payer: Self-pay

## 2020-10-05 VITALS — BP 138/84 | HR 68 | Ht 67.0 in | Wt 266.0 lb

## 2020-10-05 DIAGNOSIS — I2510 Atherosclerotic heart disease of native coronary artery without angina pectoris: Secondary | ICD-10-CM | POA: Diagnosis not present

## 2020-10-05 DIAGNOSIS — Z6841 Body Mass Index (BMI) 40.0 and over, adult: Secondary | ICD-10-CM

## 2020-10-05 DIAGNOSIS — G20 Parkinson's disease: Secondary | ICD-10-CM | POA: Diagnosis not present

## 2020-10-05 DIAGNOSIS — G4731 Primary central sleep apnea: Secondary | ICD-10-CM | POA: Diagnosis not present

## 2020-10-05 NOTE — Patient Instructions (Signed)
Parkinson's disease and central or complex apnea coincide.  I will refill- reorder an ASV machine - new in November 2022.  Revisit 3 month after h your ASV was delivered with Butler Denmark, NP.

## 2020-10-05 NOTE — Progress Notes (Signed)
PATIENT: Todd Rice                     [facility] DOB: December 05, 1955  REASON FOR VISIT: follow up HISTORY FROM: patient  HISTORY OF PRESENT ILLNESS:    RV from  10/05/20 , patient jhere to follow up on recent CPAP- ASV orders in central apnea with PD. He last presented with a concern about sleep quality. States machine is working ok but he has noticed in last couple months a decline in his sleep quality and duration, stating that he will go 3-4 nights in a row with only  4-5 hours of sleep and then have a night where he sleeps maybe 8-9 hrs. He uses a FFM and yet has oral air leaks, increasing. He joined a Engineer, building services and is feeling much better since last March- he loves to swim.   He is currently still using I his old machine as he will only become eligible 03-29-2021. That will be the 5 year anniversary of her machine set up.  I will write a new prescription for him today.  I have the pleasure of looking at the high compliance rate and seeing the V auto machine support here the past patient actually used a BiPAP with a minimum of 10 cmH2O a maximum of 17 cmH2O pressure support and a set pressure support of 4 cmH2O so this would be an ASV machine and he allows to reach a residual of 7.7 AHI of which are 4.1 unknown events this is due to the air leakage.  The machine actually works well.  Epworth SS at 2 points(!).      Persistent, severe complex sleep apnea at AHI 52.2/h with only  mild REM sleep accentuation and 25% central apneas. The patient  had no prolonged sleep hypoxia during this HST, despite very  frequent, albeit brief, o2 desaturations.  All sleep was recorded in supine position and associated with  loud snoring. Noted trend to bradycardia.  Recommendations:    The patient is likely to need ASV again, and a new titration may  be avoided if the current settings eliminated his complex apnea  so well. For now, until October, he will use the current machine  and  we try to improve on the interface.  I will order a chin strap if not already purchased and he may do  well with an ResMed F 30 I with nasal cradle or pillows, further  eliminating air leakage.      07-30-2019. pt with wife, rm 52. Todd Rice is a 1 year-old male patient of Todd Rice with a concern about sleep quality. States machine is working ok but he has noticed in last couple months a decline in his sleep quality and duration, stating that he will go 3-4 nights in a row with only  4-5 hours of sleep and then have a night where he sleeps maybe 8-9 hrs. He uses a FFM and yet has oral air leaks, increasing.  Cyclic sleep pattern. Essential hypertension, shoulder injury, has been in outpatient Rehab for his PD.  Patient has received both Covid shots.   BMI is unchanged at 43. 5 kg/m2.  He walks outside, not going to the gym. He is mask compliant. Bedtime is 10-11PM and rise time is 5.30 AM-  An entrained pattern.   This patient has been 100% compliant CPAP user by days but when it comes to 4 hours of consecutive use he seems to  have had 5 days with trouble last months.  His average user time is about 5-1/2 hours, he is using a ASV machine air curve with a maximum inspiratory pressure of 17 minimum expiratory PS of10 cmH2O and PPEP support of 4 cmH2O.  No central apneas were recorded.  The obstructive apneas are 1.0 be hypopneas 0.8/h and the unknown apneas filtration 1.5" this is usually at arrhythmias count due to air leakage.  The machine mistakes an oral airleak for an apnea. The ASV is 65 years old in October- it sometimes shuts off- and leaves him breathless.  This 65 y.o. year old male  has a past medical history of Adult BMI 50.0-59.9 kg/sq m, Anal fissure, Anxiety, Diabetes mellitus without complication (Todd Rice), GERD (gastroesophageal reflux disease), H/O: gout, Hepatic steatosis (10/01/11), History of kidney stones, Hyperlipidemia, Hypertension, Kidney tumor, OSA on CPAP, Parkinson disease (Todd Rice)  (01/04/2016), REM sleep behavior disorder (12/06/2016), Right ureteral stone, Ureteral calculus, right (06/12/2011), Vitamin D deficiency, and Weakness.   I have the pleasure of meeting Todd Rice today, I mean by 65 year old Caucasian right-handed gentleman with a history of OSA treated on CPAP.  This is a routine follow-up for this patient who has been a highly compliant CPAP user.  He has used the machine 100% of the last 30 days and for 97% of these time over 4 hours consecutively there was only 1 day where he was a little short for 20 years of February.  He is using an air curve ASV machine with a maximum inspiratory pressure of 17 a minimum pressure support of 10 and an EPAP of 4 cmH2O.  He has only 2.4 apneas per hour left and most of these are obstructive.  He does have some massive air leakage, through the mouth-this is an air leak is much more likely in a Parkinson's patient. He feels most mornings refreshed and restored, some nights without interruption.    Todd Rice is a 65 year old male with a history of obstructive sleep apnea on CPAP.  He returns today for follow-up.  His download indicates that he use his machine 30 out of 30 days for compliance of 100%.  He uses machine greater than 4 hours 28 out of 30 days for compliance of 93%.  On average he uses his machine 6 hours and 45 minutes.  His residual AHI is 2.4.  He is on a maximum inspiratory pressure of 17 cm of water and minimum expiratory pressure of 10 cm of water with pressure support of 4 cm of water.  His leak in the 95th percentile is 98 L/min.  He states that often times during the night his mask will ride up into his mouth potentially causing a leak.  He reports that he is tried tightening the straps but continues to rise into his mouth.  He returns today for evaluation.  HISTORY copied from Todd Rice note: I had the pleasure to day of seeing Todd Rice again, wed undergone a split night polysomnography at Todd Rice on 02/24/2016. He was still diagnosed with a high degree of apnea his AHI was 33.6 he slept all night in supine position and he did not enter REM sleep. Oxygen nadir was 88% is only 2 minutes of desaturation and remarkably this patient, who suffers from Parkinson's disease, did not have periodic limb movements. He was titrated to CPAP up to 18 cm water pressure without resolution of apnea. The medical technologist changed him to a BiPAP setting which  did resolve apnea at a setting of 20/16 cm water overall he did not sleep very well in the laboratory environment but he would finally able to find a mask that seem to fit him well-  an [employer]. I'm able today to see his compliance download which has a remarkable 100% of use for the last 30 days with 75% compliance for over 4 hours of nightly use consecutively. His minimum inspiratory pressure is 17 and expiratory pressure 10 cm water, 4 cm pressure support, residual AHI is 2.1 which is a very good resolution. There were no treatment emergent central apneas on the setting.  REVIEW OF SYSTEMS: Out of a complete 14 system review of symptoms, the patient complains only of the following symptoms, and all other reviewed systems are negative.   The patient reports a more sound sleep is a more refreshing and restorative quality.  He endorsed the Epworth Sleepiness Scale at 2 points out of 24, the fatigue severity score was not endorsed, the depression scale was endorsed at 1 out of 15 points and is not indicative of clinical depression. The patient's daytime routines have not changed, his bedtimes mealtimes.  He is currently taking medication 3 times a day for Parkinson's disease. Vivid dreams, "crazy" - but he likes the dreams.  He has cyclic insomnia.  He remains morbidly obese, grade 3.  How likely are you to doze in the following situations: 0 = not likely, 1 = slight chance, 2 = moderate chance, 3 = high chance  Sitting and  Reading? Watching Television? Sitting inactive in a public place (theater or meeting)? Lying down in the afternoon when circumstances permit? Sitting and talking to someone? Sitting quietly after lunch without alcohol? In a car, while stopped for a few minutes in traffic? As a passenger in a car for an hour without a break?  Total = 6/ 24      ALLERGIES: Allergies  Allergen Reactions  . Ciprofloxacin Hives  . Ceftriaxone Hives    HOME MEDICATIONS: Outpatient Medications Prior to Visit  Medication Sig Dispense Refill  . albuterol (VENTOLIN HFA) 108 (90 Base) MCG/ACT inhaler INHALE 2 PUFFS EVERY 4 HOURS (15 MINUTES APART) FOR ASTHMA RESCUE 25.5 g 0  . allopurinol (ZYLOPRIM) 300 MG tablet TAKE 1 TABLET BY MOUTH ONCE A DAY TO TO PREVENT GOUT 90 tablet 3  . ALPRAZolam (XANAX) 0.5 MG tablet TAKE 1 TABLET BY MOUTH 3 TIMES DAILY AS NEEDED 90 tablet 0  . aspirin EC 81 MG tablet Take 81 mg by mouth at bedtime.    . carbidopa-levodopa (SINEMET IR) 25-250 MG tablet TAKE 1 TABLET BY MOUTH 3 TIMES DAILY 270 tablet 3  . COVID-19 mRNA vaccine, Pfizer, 30 MCG/0.3ML injection Inject into the muscle. 0.3 mL 0  . ezetimibe (ZETIA) 10 MG tablet Take 1 tablet (10 mg total) by mouth daily. 90 tablet 1  . JANUVIA 100 MG tablet TAKE 1 TABLET BY MOUTH ONCE A DAY 90 tablet 1  . metFORMIN (GLUCOPHAGE-XR) 500 MG 24 hr tablet TAKE 2 TABLETS BY MOUTH 2 TIMES DAILY 360 tablet 3  . mometasone (NASONEX) 50 MCG/ACT nasal spray PLACE 2 SPRAYS INTO THE NOSE DAILY. 17 g 12  . pramipexole (MIRAPEX) 1 MG tablet Take 1 tablet (1 mg total) by mouth 3 (three) times daily. 270 tablet 3  . rosuvastatin (CRESTOR) 40 MG tablet Take 1 tablet (40 mg total) by mouth daily. 90 tablet 1  . valsartan-hydrochlorothiazide (DIOVAN-HCT) 320-25 MG tablet TAKE 1 TABLET  BY MOUTH ONCE A DAY FOR BLOOD PRESSURE 90 tablet 0  . vitamin C (ASCORBIC ACID) 500 MG tablet Take 1 tablet (500 mg total) by mouth 3 (three) times daily. 21 tablet 0  .  VITAMIN D PO Take 5,000 Units by mouth 2 (two) times daily.    Marland Kitchen zolpidem (AMBIEN) 5 MG tablet Take 1 tablet (5 mg total) by mouth at bedtime as needed for sleep. 30 tablet 0  . glipiZIDE (GLUCOTROL) 5 MG tablet Take 1 tablet (5 mg total) by mouth 2 (two) times daily before a meal. 60 tablet 2   No facility-administered medications prior to visit.    PAST MEDICAL HISTORY: Past Medical History:  Diagnosis Date  . Adult BMI 50.0-59.9 kg/sq m    52.77  . Anal fissure   . Anxiety   . Diabetes mellitus without complication (HCC)    borderline  . GERD (gastroesophageal reflux disease)    30 years ago, maybe from peptic ulcer  . H/O: gout    STABLE  . Hepatic steatosis 10/01/11  . History of kidney stones   . Hyperlipidemia   . Hypertension   . Kidney tumor    right upper kidney tumor, monitoring  . OSA on CPAP    [facility]  . Parkinson disease ([place]) 01/04/2016  . REM sleep behavior disorder 12/06/2016  . Right ureteral stone   . Ureteral calculus, right 06/12/2011  . Vitamin D deficiency   . Weakness     PAST SURGICAL HISTORY: Past Surgical History:  Procedure Laterality Date  . ANAL FISSURE REPAIR  10-12-2000  . COLONOSCOPY N/A 07/01/2012   Procedure: COLONOSCOPY;  Surgeon: Lafayette Dragon, MD;  Location: [facility];  Service: Endoscopy;  Laterality: N/A;  . CYSTOSCOPY WITH RETROGRADE PYELOGRAM, URETEROSCOPY AND STENT PLACEMENT Bilateral 02/26/2013   Procedure: CYSTOSCOPY WITH RETROGRADE PYELOGRAM, URETEROSCOPY AND STENT PLACEMENT;  Surgeon: Alexis Frock, MD;  Location: [facility];  Service: Urology;  Laterality: Bilateral;  . HOLMIUM LASER APPLICATION Bilateral 10/29/7626   Procedure: HOLMIUM LASER APPLICATION;  Surgeon: Alexis Frock, MD;  Location: [facility];  Service: Urology;  Laterality: Bilateral;  . kidney stone removal     06/2011-stent placement  . LEFT MEDIAL FEMORAL CONDYLE DEBRIDEMENT & DRILLING/ REMOVAL LOOSE BODY  09-15-2003  . NASAL SEPTUM SURGERY  1985  . STONE  EXTRACTION WITH BASKET  06/12/2011   Procedure: STONE EXTRACTION WITH BASKET;  Surgeon: Claybon Jabs, MD;  Location: [facility];  Service: Urology;  Laterality: Right;    FAMILY HISTORY: Family History  Problem Relation Age of Onset  . Heart disease Mother        Atrial fibrillation  . Hypertension Mother   . Hyperlipidemia Mother   . Lung cancer Father        was a smoker  . Cancer Father        lung  . Hyperlipidemia Brother   . Heart disease Maternal Aunt   . Hyperlipidemia Maternal Aunt   . Hypertension Maternal Aunt   . Stroke Maternal Aunt   . Heart disease Paternal Grandmother   . Hyperlipidemia Paternal Grandmother     SOCIAL HISTORY: Social History   Socioeconomic History  . Marital status: Married    Spouse name: Not on file  . Number of children: 0  . Years of education: Not on file  . Highest education level: Not on file  Occupational History  . Not on file  Social Needs  . Financial resource strain: Not  on file  . Food insecurity:    Worry: Not on file    Inability: Not on file  . Transportation needs:    Medical: Not on file    Non-medical: Not on file  Tobacco Use  . Smoking status: Never Smoker  . Smokeless tobacco: Never Used  Substance and Sexual Activity  . Alcohol use: No  . Drug use: No    Comment: QUIT SMOKING " POT" 40 YRS AGO  . Sexual activity: Not on file  Lifestyle  . Physical activity:    Days per week:  5 days a week for 30 minutes or more.     Minutes per session: Daily stretching exercises.                                                                 Other Topics Concern  . Not on file  Social History Narrative   Right-handed.   No caffeine use.   Lives at home with his wife.      PHYSICAL EXAM  Vitals:   10/05/20 0819  BP: 138/84  Pulse: 68  Weight: 266 lb (120.7 kg)  Height: 5\' 7"  (1.702 m)   Body mass index is 41.66 kg/m.  Generalized:  Obese, Ankle edema.     Neurological examination  Mentation: Alert oriented to time, place, history taking. Follows all commands speech and language fluent- he is short of breath, his voice is dysphonic and low volume.   Cranial nerve:  No loss of smell or taste. Pupils were equal round reactive to light.  Extraocular movements were full, visual field were full . Right sided ptosis. Facial expression is masked - rarely blinking. Uvula and tongue move in midline. Head turning and shoulder shrug  were limited. Motor: reduced strength and increased muscle tone of all 4 extremities.  Cogwheeling. Sensory: intact to vibration and soft touch on all extremities. Ankle edema affected vibration sense.  Coordination: slowed movements,  Gait and station: Gait is stooped, very slowed, and of wider base.  Reflexes: Deep tendon reflexes are symmetrically attenuated.  DIAGNOSTIC DATA (LABS, IMAGING, TESTING) - I reviewed patient records, labs, notes, testing and imaging myself where available.  Lab Results  Component Value Date   WBC 7.4 09/27/2020   HGB 15.4 09/27/2020   HCT 45.5 09/27/2020   MCV 85.0 09/27/2020   PLT 161 09/27/2020      Component Value Date/Time   NA 137 09/27/2020 1008   K 4.1 09/27/2020 1008   CL 100 09/27/2020 1008   CO2 27 09/27/2020 1008   GLUCOSE 112 (H) 09/27/2020 1008   BUN 22 09/27/2020 1008   CREATININE 1.27 (H) 09/27/2020 1008   CALCIUM 10.8 (H) 09/27/2020 1008   PROT 7.6 09/27/2020 1008   ALBUMIN 3.8 10/24/2016 1601   AST 16 09/27/2020 1008   ALT 18 09/27/2020 1008   ALKPHOS 58 10/24/2016 1601   BILITOT 0.4 09/27/2020 1008   GFRNONAA 58 (L) 09/27/2020 1008   GFRAA 68 09/27/2020 1008   Lab Results  Component Value Date   CHOL 202 (H) 09/27/2020   HDL 32 (L) 09/27/2020   LDLCALC  09/27/2020     Comment:     . LDL cholesterol not calculated. Triglyceride levels greater than 400 mg/dL invalidate calculated  LDL results. . Reference range: <100 . Desirable range <100 mg/dL  for primary prevention;   <70 mg/dL for patients with CHD or diabetic patients  with > or = 2 CHD risk factors. Marland Kitchen LDL-C is now calculated using the Martin-Hopkins  calculation, which is a validated novel method providing  better accuracy than the Friedewald equation in the  estimation of LDL-C.  Cresenciano Genre et al. Annamaria Helling. 7681;157(26): 2061-2068  (http://education.[employer].com/faq/FAQ164)    TRIG 485 (H) 09/27/2020   CHOLHDL 6.3 (H) 09/27/2020   Lab Results  Component Value Date   HGBA1C 7.1 (H) 09/27/2020   Lab Results  Component Value Date   VITAMINB12 667 09/26/2018   Lab Results  Component Value Date   TSH 4.29 09/27/2020      ASSESSMENT - 28 minute VISIT duration.    1. OSA on ASV/ advanced servo ventilation .- The machine will need to be replaced in October 3931, 65 years old. He still has a very dry moth.  2. Insomnia, non refreshing sleep, reduced sleep duration.    3. PD - advanced. Affecting soft palatal tone, speech dysphonia and causing chin drop at night- thus creating air leaks.   PLANASV 17/10 /0/4cm pressure support. the patient has a high oral Air leak.  Uses a FFM. The chin drop may be the answer - and his water reservoir is empty every morning.  He needs a chin strap.  He should try a REsMed large F30 I with nasal pillows.  His nose is always stooped up- deviated septum repair was not successful. He will always be a mouth breather.   I will oder a chin strap I will order ambien prn 5 mg. I will order nasonex spray.     He will follow-up in 6 month for seep clinic care , and continues ot see Todd. Jannifer Rice for PD>   I spent  28 minutes with the patient. Face to face- reviewing CPAP download and medication changes, PD progression and effects on sleep quality, fatigue level.    Larey Seat, MD   10/05/2020, 8:40 AM [facility] [address], [place], [place] [address] [phone]

## 2020-10-07 ENCOUNTER — Other Ambulatory Visit: Payer: Self-pay | Admitting: Neurology

## 2020-10-07 DIAGNOSIS — R063 Periodic breathing: Secondary | ICD-10-CM

## 2020-10-07 DIAGNOSIS — G4731 Primary central sleep apnea: Secondary | ICD-10-CM

## 2020-10-07 DIAGNOSIS — G4752 REM sleep behavior disorder: Secondary | ICD-10-CM

## 2020-10-07 DIAGNOSIS — G20 Parkinson's disease: Secondary | ICD-10-CM

## 2020-10-14 ENCOUNTER — Other Ambulatory Visit (HOSPITAL_COMMUNITY): Payer: Self-pay

## 2020-10-15 ENCOUNTER — Other Ambulatory Visit (HOSPITAL_COMMUNITY): Payer: Self-pay

## 2020-10-19 ENCOUNTER — Other Ambulatory Visit (HOSPITAL_COMMUNITY): Payer: Self-pay

## 2020-10-19 MED FILL — Allopurinol Tab 300 MG: ORAL | 90 days supply | Qty: 90 | Fill #1 | Status: AC

## 2020-11-01 DIAGNOSIS — G4733 Obstructive sleep apnea (adult) (pediatric): Secondary | ICD-10-CM | POA: Diagnosis not present

## 2020-11-03 ENCOUNTER — Other Ambulatory Visit (HOSPITAL_COMMUNITY): Payer: Self-pay

## 2020-11-03 ENCOUNTER — Other Ambulatory Visit: Payer: Self-pay | Admitting: Physician Assistant

## 2020-11-03 DIAGNOSIS — E785 Hyperlipidemia, unspecified: Secondary | ICD-10-CM

## 2020-11-03 MED ORDER — ROSUVASTATIN CALCIUM 40 MG PO TABS
40.0000 mg | ORAL_TABLET | Freq: Every day | ORAL | 1 refills | Status: DC
Start: 2020-11-03 — End: 2021-09-20
  Filled 2020-11-03 – 2021-02-14 (×3): qty 90, 90d supply, fill #0

## 2020-11-15 ENCOUNTER — Other Ambulatory Visit: Payer: Self-pay | Admitting: Adult Health

## 2020-11-15 ENCOUNTER — Other Ambulatory Visit (HOSPITAL_COMMUNITY): Payer: Self-pay

## 2020-11-15 MED ORDER — JANUVIA 100 MG PO TABS
100.0000 mg | ORAL_TABLET | Freq: Every day | ORAL | 0 refills | Status: DC
  Filled 2020-11-15: qty 30, 30d supply, fill #0
  Filled 2020-12-29: qty 30, 30d supply, fill #1
  Filled 2021-03-06: qty 30, 30d supply, fill #0

## 2020-11-23 ENCOUNTER — Other Ambulatory Visit: Payer: Self-pay | Admitting: Internal Medicine

## 2020-11-23 ENCOUNTER — Other Ambulatory Visit (HOSPITAL_COMMUNITY): Payer: Self-pay

## 2020-11-23 DIAGNOSIS — I10 Essential (primary) hypertension: Secondary | ICD-10-CM

## 2020-11-23 MED ORDER — VALSARTAN-HYDROCHLOROTHIAZIDE 320-25 MG PO TABS
1.0000 | ORAL_TABLET | Freq: Every day | ORAL | 1 refills | Status: DC
Start: 2020-11-23 — End: 2021-05-25
  Filled 2020-11-23 – 2021-02-14 (×2): qty 90, 90d supply, fill #0

## 2020-11-25 ENCOUNTER — Other Ambulatory Visit (HOSPITAL_COMMUNITY): Payer: Self-pay

## 2020-11-25 MED ORDER — HYDROCODONE-ACETAMINOPHEN 5-325 MG PO TABS
ORAL_TABLET | ORAL | 0 refills | Status: DC
  Filled 2020-11-25: qty 12, 2d supply, fill #0

## 2020-11-25 MED ORDER — AMOXICILLIN-POT CLAVULANATE 875-125 MG PO TABS
ORAL_TABLET | ORAL | 0 refills | Status: DC
  Filled 2020-11-25: qty 14, 7d supply, fill #0

## 2020-11-29 ENCOUNTER — Other Ambulatory Visit (HOSPITAL_COMMUNITY): Payer: Self-pay

## 2020-12-16 ENCOUNTER — Telehealth: Payer: Self-pay | Admitting: Occupational Therapy

## 2020-12-16 DIAGNOSIS — G20 Parkinson's disease: Secondary | ICD-10-CM

## 2020-12-16 NOTE — Telephone Encounter (Signed)
  Dr. [name],  Phyllis Ginger. Letizia is scheduled for  OT and PT re-evaluations on 02/01/21 as recommended when he was last discharged from therapy due to progressive nature of diagnosis.  Pt was in agreement with this plan.  If you are in agreement, please send updated order for OT, PT via epic.  Thank you,    Vianne Bulls, OTR/L [facility] [address] [place], [place]  [address] [phone] phone [phone] 12/16/20 11:44 AM

## 2020-12-29 ENCOUNTER — Other Ambulatory Visit (HOSPITAL_COMMUNITY): Payer: Self-pay

## 2020-12-29 MED FILL — Allopurinol Tab 300 MG: ORAL | 90 days supply | Qty: 90 | Fill #2 | Status: AC

## 2021-01-13 ENCOUNTER — Encounter: Payer: Medicare Other | Admitting: Occupational Therapy

## 2021-01-13 ENCOUNTER — Ambulatory Visit: Payer: Medicare Other | Admitting: Physical Therapy

## 2021-01-13 ENCOUNTER — Ambulatory Visit: Payer: Medicare Other

## 2021-01-19 ENCOUNTER — Other Ambulatory Visit (HOSPITAL_BASED_OUTPATIENT_CLINIC_OR_DEPARTMENT_OTHER): Payer: Self-pay

## 2021-01-27 ENCOUNTER — Ambulatory Visit: Payer: Medicare Other | Attending: Internal Medicine

## 2021-01-27 ENCOUNTER — Other Ambulatory Visit (HOSPITAL_BASED_OUTPATIENT_CLINIC_OR_DEPARTMENT_OTHER): Payer: Self-pay

## 2021-01-27 DIAGNOSIS — Z23 Encounter for immunization: Secondary | ICD-10-CM

## 2021-01-27 MED ORDER — PFIZER COVID-19 VAC BIVALENT 30 MCG/0.3ML IM SUSP
INTRAMUSCULAR | 0 refills | Status: DC
  Filled 2021-01-27: qty 0.3, 1d supply, fill #0

## 2021-01-27 NOTE — Progress Notes (Signed)
   Covid-19 Vaccination Clinic  Name:  Todd Rice    MRN: [id number] DOB: 02-08-1956  01/27/2021  Todd Rice was observed post Covid-19 immunization for 15 minutes without incident. He was provided with Vaccine Information Sheet and instruction to access the V-Safe system.   Todd Rice was instructed to call 911 with any severe reactions post vaccine: Difficulty breathing  Swelling of face and throat  A fast heartbeat  A bad rash all over body  Dizziness and weakness

## 2021-01-31 ENCOUNTER — Encounter: Payer: Self-pay | Admitting: Neurology

## 2021-01-31 ENCOUNTER — Ambulatory Visit: Payer: Medicare Other | Admitting: Neurology

## 2021-01-31 VITALS — BP 123/83 | HR 72 | Ht 67.0 in | Wt 260.0 lb

## 2021-01-31 DIAGNOSIS — G20 Parkinson's disease: Secondary | ICD-10-CM

## 2021-01-31 NOTE — Progress Notes (Signed)
Reason for visit: Parkinson's disease  Todd Rice is an 65 y.o. male  History of present illness:  Todd Rice is a 65 year old right-handed white male with a history of Parkinson's disease.  He has been going through physical and occupational therapy with some benefit.  He is trying to stay active, he goes to the gym on a regular basis.  He will walk regularly.  He may have occasional "slow days" where he does not feel that he functions as well but usually he gets a smooth response from the Sinemet.  He is sleeping well at night, he is on CPAP for sleep apnea.  He has a good energy level during the day.  He has not any falls, he denies problems with swallowing.  Past Medical History:  Diagnosis Date   Adult BMI 50.0-59.9 kg/sq m    52.77   Anal fissure    Anxiety    Diabetes mellitus without complication (HCC)    borderline   GERD (gastroesophageal reflux disease)    30 years ago, maybe from peptic ulcer   H/O: gout    STABLE   Hepatic steatosis 10/01/11   History of kidney stones    Hyperlipidemia    Hypertension    Kidney tumor    right upper kidney tumor, monitoring   OSA on CPAP    [employer] [facility]   Parkinson disease ([place]) 01/04/2016   REM sleep behavior disorder 12/06/2016   Right ureteral stone    Ureteral calculus, right 06/12/2011   Vitamin D deficiency    Weakness     Past Surgical History:  Procedure Laterality Date   ANAL FISSURE REPAIR  10-12-2000   COLONOSCOPY N/A 07/01/2012   Procedure: COLONOSCOPY;  Surgeon: Lafayette Dragon, MD;  Location: [facility];  Service: Endoscopy;  Laterality: N/A;   CYSTOSCOPY WITH RETROGRADE PYELOGRAM, URETEROSCOPY AND STENT PLACEMENT Bilateral 02/26/2013   Procedure: CYSTOSCOPY WITH RETROGRADE PYELOGRAM, URETEROSCOPY AND STENT PLACEMENT;  Surgeon: Alexis Frock, MD;  Location: [facility];  Service: Urology;  Laterality: Bilateral;   HOLMIUM LASER APPLICATION Bilateral 74/04/8785   Procedure: HOLMIUM LASER APPLICATION;  Surgeon:  Alexis Frock, MD;  Location: [facility];  Service: Urology;  Laterality: Bilateral;   kidney stone removal     06/2011-stent placement   LEFT MEDIAL FEMORAL CONDYLE DEBRIDEMENT & DRILLING/ REMOVAL LOOSE BODY  09-15-2003   NASAL SEPTUM SURGERY  1985   STONE EXTRACTION WITH BASKET  06/12/2011   Procedure: STONE EXTRACTION WITH BASKET;  Surgeon: Claybon Jabs, MD;  Location: [facility];  Service: Urology;  Laterality: Right;    Family History  Problem Relation Age of Onset   Heart disease Mother        Atrial fibrillation   Hypertension Mother    Hyperlipidemia Mother    Lung cancer Father        was a smoker   Cancer Father        lung   Hyperlipidemia Brother    Heart disease Maternal Aunt    Hyperlipidemia Maternal Aunt    Hypertension Maternal Aunt    Stroke Maternal Aunt    Heart disease Paternal Grandmother    Hyperlipidemia Paternal Grandmother     Social history:  reports that he has quit smoking. He has never used smokeless tobacco. He reports that he does not drink alcohol and does not use drugs.    Allergies  Allergen Reactions   Ciprofloxacin Hives   Ceftriaxone Hives    Medications:  Prior to Admission medications   Medication Sig Start Date End Date Taking? Authorizing Provider  albuterol (VENTOLIN HFA) 108 (90 Base) MCG/ACT inhaler INHALE 2 PUFFS EVERY 4 HOURS (15 MINUTES APART) FOR ASTHMA RESCUE 03/01/20 03/01/21 Yes Unk Pinto, MD  allopurinol (ZYLOPRIM) 300 MG tablet TAKE 1 TABLET BY MOUTH ONCE A DAY TO TO PREVENT GOUT 08/06/20 08/06/21 Yes Corbett, Caryl Pina, NP  ALPRAZolam Duanne Moron) 0.5 MG tablet TAKE 1 TABLET BY MOUTH 3 TIMES DAILY AS NEEDED 08/06/20 02/02/21 Yes [name], NP  amoxicillin-clavulanate (AUGMENTIN) 875-125 MG tablet TAKE 1 TABLET BY MOUTH 2 TIMES DAILY UNTIL ALL GONE 11/25/20  Yes   aspirin EC 81 MG tablet Take 81 mg by mouth at bedtime.   Yes [provider]  carbidopa-levodopa (SINEMET IR) 25-250 MG tablet TAKE 1  TABLET BY MOUTH 3 TIMES DAILY 08/31/20 08/31/21 Yes Kathrynn Ducking, MD  COVID-19 mRNA bivalent vaccine, Pfizer, (PFIZER COVID-19 VAC BIVALENT) injection Inject into the muscle. 01/27/21  Yes Carlyle Basques, MD  COVID-19 mRNA vaccine, Pfizer, 30 MCG/0.3ML injection Inject into the muscle. 08/17/20  Yes Carlyle Basques, MD  HYDROcodone-acetaminophen (NORCO/VICODIN) 5-325 MG tablet Take 1 tablet by mouth every 4-6 hours as needed for pain 11/25/20  Yes   JANUVIA 100 MG tablet Take 1 tablet (100 mg total) by mouth daily. 11/15/20 11/15/21 Yes [name], NP  metFORMIN (GLUCOPHAGE-XR) 500 MG 24 hr tablet TAKE 2 TABLETS BY MOUTH 2 TIMES DAILY 09/27/20 09/27/21 Yes Corbett, Caryl Pina, NP  mometasone (NASONEX) 50 MCG/ACT nasal spray PLACE 2 SPRAYS INTO THE NOSE DAILY. 08/02/20 08/02/21 Yes McClanahan, Danton Sewer, NP  pramipexole (MIRAPEX) 1 MG tablet Take 1 tablet (1 mg total) by mouth 3 (three) times daily. 08/31/20  Yes Kathrynn Ducking, MD  rosuvastatin (CRESTOR) 40 MG tablet Take 1 tablet (40 mg total) by mouth daily. 11/03/20  Yes [name], NP  valsartan-hydrochlorothiazide (DIOVAN-HCT) 320-25 MG tablet TAKE 1 TABLET BY MOUTH ONCE A DAY FOR BLOOD PRESSURE 11/23/20 11/23/21 Yes [name], NP  vitamin C (ASCORBIC ACID) 500 MG tablet Take 1 tablet (500 mg total) by mouth 3 (three) times daily. 01/16/17  Yes [name], NP  VITAMIN D PO Take 5,000 Units by mouth 2 (two) times daily.   Yes [provider]  zolpidem (AMBIEN) 5 MG tablet Take 1 tablet (5 mg total) by mouth at bedtime as needed for sleep. 07/30/19  Yes Dohmeier, Asencion Partridge, MD  ezetimibe (ZETIA) 10 MG tablet Take 1 tablet (10 mg total) by mouth daily. 11/14/19 11/13/20  Vladimir Crofts, PA-C  glipiZIDE (GLUCOTROL) 5 MG tablet Take 1 tablet (5 mg total) by mouth 2 (two) times daily before a meal. 10/07/18 10/07/19  Vladimir Crofts, PA-C    ROS:  Out of a complete 14 system review of symptoms, the patient complains only of the following  symptoms, and all other reviewed systems are negative.  Tremor Weight gain  Blood pressure 123/83, pulse 72, height 5\' 7"  (1.702 m), weight 260 lb (117.9 kg).  Physical Exam  General: The patient is alert and cooperative at the time of the examination.  The patient is markedly obese.  Skin: No significant peripheral edema is noted.   Neurologic Exam  Mental status: The patient is alert and oriented x 3 at the time of the examination. The patient has apparent normal recent and remote memory, with an apparently normal attention span and concentration ability.   Cranial nerves: Facial symmetry is present. Speech is normal, no aphasia or dysarthria is  noted. Extraocular movements are full.  Lid lag is seen with inferior gaze.  Visual fields are full.  Masking the face is seen.  Occasional jaw tremor is seen.  Motor: The patient has good strength in all 4 extremities.  Sensory examination: Soft touch sensation is symmetric on the face, arms, and legs.  Coordination: The patient has good finger-nose-finger and heel-to-shin bilaterally.  Resting tremor with the right upper extremity is noted.  Gait and station: The patient is able to arise from seated position with arms crossed.  Once up, he can walk independently with fairly good stride and turns.  Decreased arm swing is seen bilaterally.  Tandem gait is minimally unsteady.  Romberg is negative.  Reflexes: Deep tendon reflexes are symmetric.   Assessment/Plan:  1.  Parkinson's disease  The patient seems to be doing fairly well with his current medication regimen.  He reports good stability since last seen.  We will not alter the medication regimen at this point.  He will follow-up here in 6 months, in the future he can be seen through Dr. Leta Baptist.  Jill Alexanders MD 01/31/2021 1:43 PM  [facility] [address] [place], [place] [address]  Phone [phone] Fax [phone]

## 2021-02-01 ENCOUNTER — Ambulatory Visit: Payer: Medicare Other | Admitting: Occupational Therapy

## 2021-02-01 ENCOUNTER — Other Ambulatory Visit: Payer: Self-pay

## 2021-02-01 ENCOUNTER — Ambulatory Visit: Payer: Medicare Other | Attending: Internal Medicine | Admitting: Physical Therapy

## 2021-02-01 ENCOUNTER — Encounter: Payer: Self-pay | Admitting: Occupational Therapy

## 2021-02-01 ENCOUNTER — Ambulatory Visit: Payer: Medicare Other

## 2021-02-01 ENCOUNTER — Encounter: Payer: Self-pay | Admitting: Physical Therapy

## 2021-02-01 DIAGNOSIS — R293 Abnormal posture: Secondary | ICD-10-CM | POA: Insufficient documentation

## 2021-02-01 DIAGNOSIS — M25512 Pain in left shoulder: Secondary | ICD-10-CM | POA: Diagnosis not present

## 2021-02-01 DIAGNOSIS — R29898 Other symptoms and signs involving the musculoskeletal system: Secondary | ICD-10-CM | POA: Diagnosis not present

## 2021-02-01 DIAGNOSIS — R278 Other lack of coordination: Secondary | ICD-10-CM | POA: Insufficient documentation

## 2021-02-01 DIAGNOSIS — R251 Tremor, unspecified: Secondary | ICD-10-CM | POA: Diagnosis not present

## 2021-02-01 DIAGNOSIS — R2681 Unsteadiness on feet: Secondary | ICD-10-CM | POA: Diagnosis not present

## 2021-02-01 DIAGNOSIS — R471 Dysarthria and anarthria: Secondary | ICD-10-CM

## 2021-02-01 DIAGNOSIS — G8929 Other chronic pain: Secondary | ICD-10-CM | POA: Insufficient documentation

## 2021-02-01 DIAGNOSIS — R2689 Other abnormalities of gait and mobility: Secondary | ICD-10-CM | POA: Diagnosis not present

## 2021-02-01 DIAGNOSIS — R29818 Other symptoms and signs involving the nervous system: Secondary | ICD-10-CM | POA: Insufficient documentation

## 2021-02-01 NOTE — Therapy (Signed)
[facility] [address] [place], [place], [address] Phone: [phone]   Fax:  [phone]  Physical Therapy Evaluation  Patient Details  Name: Todd Rice MRN: [id number] Date of Birth: 01-27-1956 Referring Provider (PT): Kathrynn Ducking   Encounter Date: 02/01/2021   PT End of Session - 02/01/21 1529     Visit Number 1    Number of Visits 1    Authorization Type [employer] Medicare    PT Start Time 2355    PT Stop Time 1442    PT Time Calculation (min) 39 min    Activity Tolerance Patient tolerated treatment well    Behavior During Therapy [facility] for tasks assessed/performed             Past Medical History:  Diagnosis Date   Adult BMI 50.0-59.9 kg/sq m    52.77   Anal fissure    Anxiety    Diabetes mellitus without complication (HCC)    borderline   GERD (gastroesophageal reflux disease)    30 years ago, maybe from peptic ulcer   H/O: gout    STABLE   Hepatic steatosis 10/01/11   History of kidney stones    Hyperlipidemia    Hypertension    Kidney tumor    right upper kidney tumor, monitoring   OSA on CPAP    [facility]   Parkinson disease ([place]) 01/04/2016   REM sleep behavior disorder 12/06/2016   Right ureteral stone    Ureteral calculus, right 06/12/2011   Vitamin D deficiency    Weakness     Past Surgical History:  Procedure Laterality Date   ANAL FISSURE REPAIR  10-12-2000   COLONOSCOPY N/A 07/01/2012   Procedure: COLONOSCOPY;  Surgeon: Lafayette Dragon, MD;  [facility];  Service: Endoscopy;  Laterality: N/A;   CYSTOSCOPY WITH RETROGRADE PYELOGRAM, URETEROSCOPY AND STENT PLACEMENT Bilateral 02/26/2013   Procedure: CYSTOSCOPY WITH RETROGRADE PYELOGRAM, URETEROSCOPY AND STENT PLACEMENT;  Surgeon: Alexis Frock, MD;  [facility];  Service: Urology;  Laterality: Bilateral;   HOLMIUM LASER APPLICATION Bilateral 73/22/0254   Procedure: HOLMIUM LASER APPLICATION;  Surgeon: Alexis Frock, MD;  Location: [facility];  Service: Urology;  Laterality: Bilateral;   kidney stone removal     06/2011-stent placement   LEFT MEDIAL FEMORAL CONDYLE DEBRIDEMENT & DRILLING/ REMOVAL LOOSE BODY  09-15-2003   NASAL SEPTUM SURGERY  1985   STONE EXTRACTION WITH BASKET  06/12/2011   Procedure: STONE EXTRACTION WITH BASKET;  Surgeon: Claybon Jabs, MD;  Location: [facility];  Service: Urology;  Laterality: Right;    There were no vitals filed for this visit.    Subjective Assessment - 02/01/21 1410     Subjective Working out at [employer] about 3-4 times per week.  No falls.    Patient Stated Goals Pt's goal is to "come back in 6 months."    Currently in Pain? No/denies                [facility] PT Assessment - 02/01/21 1413       Assessment   Medical Diagnosis Parkinson's Disease    Referring Provider (PT) Margette Fast K    Onset Date/Surgical Date 12/16/20    Hand Dominance Right    Next MD Visit 01/31/2021    Prior Therapy spring 2022      Precautions   Precautions None      Balance Screen   Has the patient fallen in the past  6 months No    Has the patient had a decrease in activity level because of a fear of falling?  No    Is the patient reluctant to leave their home because of a fear of falling?  No      Home Social worker Private residence    Living Arrangements Spouse/significant other    Available Help at Discharge Family    Type of [place] to enter    Entrance Stairs-Number of Steps 1    Entrance Stairs-Rails None    Home Layout Two level;Able to live on main level with bedroom/bathroom    Additional Comments Walks the dog, does household chores      Prior Function   Level of Independence Independent    Leisure working out a [employer] (cardio, some machines) 3-4x/wk; cleaning rooms upstairs      Observation/Other Assessments   Focus on Therapeutic Outcomes (FOTO)  NA      ROM / Strength   AROM /  PROM / Strength AROM;Strength      AROM   Overall AROM  Within functional limits for tasks performed   BLEs     Strength   Overall Strength Within functional limits for tasks performed      Transfers   Transfers Sit to Stand;Stand to Sit    Sit to Stand 6: Modified independent (Device/Increase time);Without upper extremity assist;From chair/3-in-1    Five time sit to stand comments  12.22    Stand to Sit 6: Modified independent (Device/Increase time);Without upper extremity assist;To chair/3-in-1      Ambulation/Gait   Ambulation/Gait Yes    Ambulation/Gait Assistance 6: Modified independent (Device/Increase time)    Ambulation Distance (Feet) 200 Feet    Assistive device None    Gait Pattern Step-through pattern    Ambulation Surface Level;Indoor    Gait velocity 9.07 sec =3.61 ft/sec      Standardized Balance Assessment   Standardized Balance Assessment Timed Up and Go Test;Mini-BESTest      Mini-BESTest   Sit To Stand Normal: Comes to stand without use of hands and stabilizes independently.    Rise to Toes Normal: Stable for 3 s with maximum height.    Stand on one leg (left) Normal: 20 s.    Stand on one leg (right) Normal: 20 s.    Stand on one leg - lowest score 2    Compensatory Stepping Correction - Forward Normal: Recovers independently with a single, large step (second realignement is allowed).    Compensatory Stepping Correction - Backward Normal: Recovers independently with a single, large step    Compensatory Stepping Correction - Left Lateral Normal: Recovers independently with 1 step (crossover or lateral OK)    Compensatory Stepping Correction - Right Lateral Normal: Recovers independently with 1 step (crossover or lateral OK)    Stepping Corredtion Lateral - lowest score 2    Stance - Feet together, eyes open, firm surface  Normal: 30s    Stance - Feet together, eyes closed, foam surface  Normal: 30s    Incline - Eyes Closed Normal: Stands independently 30s and  aligns with gravity    Change in Gait Speed Normal: Significantly changes walkling speed without imbalance    Walk with head turns - Horizontal Normal: performs head turns with no change in gait speed and good balance    Walk with pivot turns Normal: Turns with feet close FAST (< 3 steps) with good  balance.    Step over obstacles Moderate: Steps over box but touches box OR displays cautious behavior by slowing gait.    Timed UP & GO with Dual Task Normal: No noticeable change in sitting, standing or walking while backward counting when compared to TUG without    Mini-BEST total score 27      Timed Up and Go Test   Normal TUG (seconds) 11.19    Manual TUG (seconds) 11.65    Cognitive TUG (seconds) 10.81    TUG Comments Scores >13.5 sec indicate increased fall risk.                        Objective measurements completed on examination: See above findings.                PT Education - 02/01/21 1528     Education Details PT eval results, no POC at this; continue working out at [employer] and plan return eval in 6 months ([facility] for PT, speech)    Person(s) Educated Patient    Methods Explanation    Comprehension Verbalized understanding                         Plan - 02/01/21 1529     Clinical Impression Statement Pt is a 65 year old male who presents to OPPT with history of Parkinson's disease, who presents today for return PT eval.  Pt was discharged from previous bout of PT in April 2022.  He returns today and all functional measures are essentialy unchanged from discharge in April 2022.  Pt does not report doing his therapy specific HEP, but he has been consistent with community fitness at [employer] at least 3x/wk.  He has not had any falls in past 6 months.  He does not appear at fall risk per objective measures in eval.  He does not appear to need skilled PT at this time.  Recommend return eval in 6-9 months due to  progressive nature of disease process.    Stability/Clinical Decision Making Stable/Uncomplicated    Clinical Decision Making Low    PT Frequency One time visit    PT Next Visit Plan Eval only, no further skilled PT needs at this time.  Recommend return PT eval in 6-9 months (pt requests at [facility])    Consulted and Agree with Plan of Care Patient             Patient will benefit from skilled therapeutic intervention in order to improve the following deficits and impairments:     Visit Diagnosis: Other abnormalities of gait and mobility     Problem List Patient Active Problem List   Diagnosis Date Noted   Primary renal papillary carcinoma, right ([place]) 02/18/2020   Hepatic steatosis 02/18/2020   Major depression in remission ([place]) 11/11/2019   Central sleep apnea due to Cheyne-Stokes respiration 08/31/2019   Elevated PSA 08/13/2019   Hyperlipidemia associated with type 2 diabetes mellitus ([place]) 07/09/2019   Aortic atherosclerosis ([place]) by CXR on 06/10/2018 06/10/2018   Type 2 diabetes mellitus with stage 3a chronic kidney disease, without long-term current use of insulin ([place]) 01/21/2018   OSA treated with BiPAP 01/03/2017   REM sleep behavior disorder 12/06/2016   CKD stage 3 due to type 2 diabetes mellitus ([place]) 10/23/2016   Medication management 10/23/2016   Vitamin D deficiency 10/23/2016   Parkinson's disease ([place]) 01/04/2016  Seasonal and perennial allergic rhinitis 06/21/2014   Asthma, mild intermittent, well-controlled 06/21/2014   Pseudomonas aeruginosa colonization 10/08/2012   CAD (coronary artery disease) 11/15/2011   Essential hypertension 11/15/2011   Morbid obesity with BMI of 40.0-44.9, adult ([place]) 11/15/2011    Jessel Gettinger W., PT 02/01/2021, 3:34 PM  [facility] [address] [place], [place], [address] Phone: [phone]   Fax:  [phone]  Name: Todd Rice MRN:  [id number] Date of Birth: 1955-08-10

## 2021-02-01 NOTE — Therapy (Signed)
[facility] [address] [place], [place], [address] Phone: [phone]   Fax:  [phone]  Occupational Therapy Evaluation  Patient Details  Name: Todd Rice MRN: [id number] Date of Birth: [birth year] Referring Provider (OT): Dr. Margette Fast (will be following up Dr. Leta Baptist)   Encounter Date: 02/01/2021   OT End of Session - 02/01/21 1418     Visit Number 1    Number of Visits 10    Date for OT Re-Evaluation 03/12/21    Authorization Type UHC Medicare, follows Medicare guidelines    Authorization - Number of Visits 1    Progress Note Due on Visit 10    OT Start Time 1233    OT Stop Time 1315    OT Time Calculation (min) 42 min    Activity Tolerance Patient tolerated treatment well    Behavior During Therapy [facility] for tasks assessed/performed             Past Medical History:  Diagnosis Date   Adult BMI 50.0-59.9 kg/sq m    52.77   Anal fissure    Anxiety    Diabetes mellitus without complication (HCC)    borderline   GERD (gastroesophageal reflux disease)    30 years ago, maybe from peptic ulcer   H/O: gout    STABLE   Hepatic steatosis 10/01/11   History of kidney stones    Hyperlipidemia    Hypertension    Kidney tumor    right upper kidney tumor, monitoring   OSA on CPAP    [facility]   Parkinson disease ([place]) 01/04/2016   REM sleep behavior disorder 12/06/2016   Right ureteral stone    Ureteral calculus, right 06/12/2011   Vitamin D deficiency    Weakness     Past Surgical History:  Procedure Laterality Date   ANAL FISSURE REPAIR  10-12-2000   COLONOSCOPY N/A 07/01/2012   Procedure: COLONOSCOPY;  Surgeon: Lafayette Dragon, MD;  Location: [facility];  Service: Endoscopy;  Laterality: N/A;   CYSTOSCOPY WITH RETROGRADE PYELOGRAM, URETEROSCOPY AND STENT PLACEMENT Bilateral 02/26/2013   Procedure: CYSTOSCOPY WITH RETROGRADE PYELOGRAM, URETEROSCOPY AND STENT PLACEMENT;  Surgeon:  Alexis Frock, MD;  Location: [facility];  Service: Urology;  Laterality: Bilateral;   HOLMIUM LASER APPLICATION Bilateral 35/32/9924   Procedure: HOLMIUM LASER APPLICATION;  Surgeon: Alexis Frock, MD;  Location: [facility];  Service: Urology;  Laterality: Bilateral;   kidney stone removal     06/2011-stent placement   LEFT MEDIAL FEMORAL CONDYLE DEBRIDEMENT & DRILLING/ REMOVAL LOOSE BODY  09-15-2003   NASAL SEPTUM SURGERY  1985   STONE EXTRACTION WITH BASKET  06/12/2011   Procedure: STONE EXTRACTION WITH BASKET;  Surgeon: Claybon Jabs, MD;  Location: [facility];  Service: Urology;  Laterality: Right;    There were no vitals filed for this visit.   Subjective Assessment - 02/01/21 1239     Subjective  Pt reports undergoing extensive dental work.  Pt reports that R 3-5th digits "lock" up at times.  Pt reports intermittent mild L shoulder pain (sore)    Pertinent History Parkinson's Disease.   PMH:  CAD, HTN, HDL, obesity, asthma, DM, CKD, vitamin D deficiency, REM sleep disorder, OSA, aortic atherosclerosis, hx of gout, GERD, hx of anxiety    Currently in Pain? No/denies               [facility] OT Assessment - 02/01/21 0001       Assessment  Medical Diagnosis Parkinson's Disease    Referring Provider (OT) Dr. Margette Fast (will be following up Dr. 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; cleaning rooms upstairs      ADL   Eating/Feeding --   rarely switches to LUE with eating, difficulty cutting food at times   Grooming Modified  independent    Upper Body Bathing Modified independent    Lower Body Bathing Modified independent   has back scrubber   Upper Body Dressing --   mod I   Lower Body Dressing Modified independent    Toilet Transfer Modified independent    Toileting - Clothing Manipulation Modified independent    [place] Transfer Modified independent    Transfers/Ambulation Related to ADL's mod I, incr time      IADL   Light Housekeeping --   mod I   Meal Prep --   mod I for simple tasks.   Community Mobility Drives own vehicle   Feels like he drifts to the L some, wife hasn't noticed.   Medication Management Is responsible for taking medication in correct dosages at correct time    Prior Level of Function Financial Management wife has always performed      Mobility   Mobility Status Independent      Written Expression   Dominant Hand Right    Handwriting 100% legible   mod micrographia     Vision - History   Baseline Vision Bifocals    Additional Comments WFL per pt report      Cognition   Overall Cognitive Status Within Functional Limits for tasks assessed      Observation/Other Assessments   Standing Functional Reach Test R-12", L-15"    Other Surveys  Select    Physical Performance Test   Yes    Simulated Eating Time (seconds) 13.88sec    Simulated Eating Comments holds spoon at the end    Donning Doffing Jacket Time (seconds) 18.12   gown   Donning Doffing Jacket Comments fastening/unfastening 3 buttons in 33sec      Posture/Postural Control   Posture/Postural Control Postural limitations    Postural Limitations Rounded Shoulders;Forward head      Coordination   9 Hole Peg Test Right;Left    Right 9 Hole Peg Test 40.09    Left 9 Hole Peg Test 30.34    Box and Blocks R-41blocks, L-45blocks    Tremors noted L action tremor intermittently      Tone   Assessment Location Right Upper Extremity      ROM / Strength   AROM / PROM /  Strength AROM      AROM   Overall AROM  Within functional limits for tasks performed   grossly WFL BUEs (R shoulder flex 150* with elbow ext WNL, L shoulder flex 155* with elbow ext WNL)     RUE Tone  RUE Tone --   min-mod                               OT Short Term Goals - 02/01/21 1435       OT SHORT TERM GOAL #1   Title --               OT Long Term Goals - 02/01/21 1434       OT LONG TERM GOAL #1   Title Pt will verbalize understanding of updated strategies for ADLs to incr ease/safety and decr risk of future complications (including positioning of L shoulder to decr risk of pain and cutting food).--check LTGs 03/12/21    Time 5    Period Weeks    Status New      OT LONG TERM GOAL #2   Title Pt will improve RUE functional reaching/coordination for ADLs/IADLs as shown by improving score on box and blocks test by at least 4.    Baseline 41 blocks    Time 5    Period Weeks    Status New      OT LONG TERM GOAL #3   Title Pt will improve R hand coordination for ADLs as shown by improving time on 9-hole peg test 4 sec.    Baseline 40.09sec    Time 5    Period Weeks    Status New      OT LONG TERM GOAL #4   Title Pt will improve bilateral hand coordination for ADLs as shown by fastening/unfastening 3 buttons in 30sec or less.    Baseline 33sec    Time 5    Period Weeks    Status New      OT LONG TERM GOAL #5   Title Pt will be independent with updates to PD-specific HEP.    Time 5    Period Weeks    Status New                   Plan - 02/01/21 1425     Clinical Impression Statement Pt is a 65 y.o. male with Parkinson's disease who returns to occupational therapy.  Pt was re-evaluated today as recommended when last discharged from occupational therapy 07/2020.  Pt is familiar to this therapist.  Pt reprots slowing of R dominant hand and that 3-5th digits of R hand "lock up" at times.  Pt with PMH that includes:  CAD, HTN, HDL,  obesity, asthma, DM, CKD, vitamin D deficiency, REM sleep disorder, OSA, aortic atherosclerosis, hx of gout, GERD, hx of anxiety.  Pt presents today with decr coordination, bradykinesia, rigidity, tremor, abnormal posture, decr balance for ADLs/IADLs, intermittent mild L shoulder pain.  Pt would benefit from short course of occupational therapy to address these deficits/changes since last seen by occupational therapy for incr dominant RUE functional use, to maintain/incr ease/safety with ADLs/IADLs, and decr risk of future complications related to Parkinson's disease.    OT Occupational Profile and History Detailed Assessment- Review of Records and additional review of physical, cognitive, psychosocial history related to current functional performance    Occupational performance deficits (Please refer to evaluation for details): ADL's;IADL's;Leisure    Body Structure / Function / Physical Skills ADL;Dexterity;Balance;Tone;Pain;UE functional use;Coordination;Flexibility;FMC;Mobility;Improper spinal/pelvic alignment;IADL;ROM    Rehab Potential Good    Clinical Decision Making Several treatment options, min-mod task modification necessary    Comorbidities Affecting Occupational Performance: May have comorbidities impacting occupational performance  Modification or Assistance to Complete Evaluation  Min-Moderate modification of tasks or assist with assess necessary to complete eval    OT Frequency 2x / week    OT Duration --   x5 weeks + eval (or 10 visits--over 6 weeks prn due to scheduling).   OT Treatment/Interventions Self-care/ADL training;DME and/or AE instruction;Balance training;Therapeutic activities;Therapeutic exercise;Passive range of motion;Functional Mobility Training;Neuromuscular education;Cryotherapy;Ultrasound;Moist Heat;Fluidtherapy;Paraffin;Energy conservation;Manual Therapy;Patient/family education;Electrical Stimulation    Plan large amplitude movements; coordination R hand     Consulted and Agree with Plan of Care Patient             Patient will benefit from skilled therapeutic intervention in order to improve the following deficits and impairments:   Body Structure / Function / Physical Skills: ADL, Dexterity, Balance, Tone, Pain, UE functional use, Coordination, Flexibility, FMC, Mobility, Improper spinal/pelvic alignment, IADL, ROM       Visit Diagnosis: Other symptoms and signs involving the nervous system  Other lack of coordination  Other symptoms and signs involving the musculoskeletal system  Abnormal posture  Tremor  Chronic left shoulder pain  Unsteadiness on feet    Problem List Patient Active Problem List   Diagnosis Date Noted   Primary renal papillary carcinoma, right ([place]) 02/18/2020   Hepatic steatosis 02/18/2020   Major depression in remission ([place]) 11/11/2019   Central sleep apnea due to Cheyne-Stokes respiration 08/31/2019   Elevated PSA 08/13/2019   Hyperlipidemia associated with type 2 diabetes mellitus ([place]) 07/09/2019   Aortic atherosclerosis ([place]) by CXR on 06/10/2018 06/10/2018   Type 2 diabetes mellitus with stage 3a chronic kidney disease, without long-term current use of insulin ([place]) 01/21/2018   OSA treated with BiPAP 01/03/2017   REM sleep behavior disorder 12/06/2016   CKD stage 3 due to type 2 diabetes mellitus ([place]) 10/23/2016   Medication management 10/23/2016   Vitamin D deficiency 10/23/2016   Parkinson's disease ([place]) 01/04/2016   Seasonal and perennial allergic rhinitis 06/21/2014   Asthma, mild intermittent, well-controlled 06/21/2014   Pseudomonas aeruginosa colonization 10/08/2012   CAD (coronary artery disease) 11/15/2011   Essential hypertension 11/15/2011   Morbid obesity with BMI of 40.0-44.9, adult ([place]) 11/15/2011    Rogerio Boutelle, OT/L 02/01/2021, 2:38 PM  [facility] [address] [place], [place], [address] Phone:  [phone]   Fax:  [phone]  Name: Todd Rice MRN: [id number] Date of Birth: 28-Sep-1955  Vianne Bulls, OTR/L [facility] [address] [place], [place]  [address] [phone] phone [phone] 02/01/21 2:38 PM

## 2021-02-02 NOTE — Therapy (Signed)
[facility] [address] [place], [place], [address] Phone: [phone]   Fax:  [phone]  Patient Details  Name: Todd Rice MRN: [id number] Date of Birth: 1956/01/04 Referring Provider:  Neldon [place]., MD  Encounter Date: 02/01/2021   Speech Therapy Parkinson's Disease Screen   Decibel Level today: lower 70s  (WNL=70-72 dB) with sound level meter 30cm away from pt's masked mouth. Pt's conversational volume has essentially remained the same since last treatment course. SLP encouraged pt to continue with his loud "ah" x5/BID.  Pt does not endorse dysphagia warranting further evaluation at this time.  Pt does does not require speech therapy services at this time. Recommend ST screen in another approx 6 months.   [facility] ,[place], [place]  02/02/2021, 12:54 PM  [facility] [address] [place], [place], [address] Phone: [phone]   Fax:  [phone]

## 2021-02-03 ENCOUNTER — Ambulatory Visit: Payer: Medicare Other | Admitting: Occupational Therapy

## 2021-02-03 ENCOUNTER — Encounter: Payer: Self-pay | Admitting: Occupational Therapy

## 2021-02-03 ENCOUNTER — Other Ambulatory Visit: Payer: Self-pay

## 2021-02-03 DIAGNOSIS — R278 Other lack of coordination: Secondary | ICD-10-CM

## 2021-02-03 DIAGNOSIS — R293 Abnormal posture: Secondary | ICD-10-CM | POA: Diagnosis not present

## 2021-02-03 DIAGNOSIS — R2689 Other abnormalities of gait and mobility: Secondary | ICD-10-CM

## 2021-02-03 DIAGNOSIS — R29898 Other symptoms and signs involving the musculoskeletal system: Secondary | ICD-10-CM

## 2021-02-03 DIAGNOSIS — R251 Tremor, unspecified: Secondary | ICD-10-CM

## 2021-02-03 DIAGNOSIS — R2681 Unsteadiness on feet: Secondary | ICD-10-CM

## 2021-02-03 DIAGNOSIS — R471 Dysarthria and anarthria: Secondary | ICD-10-CM | POA: Diagnosis not present

## 2021-02-03 DIAGNOSIS — G8929 Other chronic pain: Secondary | ICD-10-CM | POA: Diagnosis not present

## 2021-02-03 DIAGNOSIS — M25512 Pain in left shoulder: Secondary | ICD-10-CM

## 2021-02-03 DIAGNOSIS — R29818 Other symptoms and signs involving the nervous system: Secondary | ICD-10-CM

## 2021-02-03 NOTE — Therapy (Signed)
[facility] [address] [place], [place], [address] Phone: [phone]   Fax:  [phone]  Occupational Therapy Treatment  Patient Details  Name: Todd Rice MRN: [id number] Date of Birth: 05/24/1955 Referring Provider (OT): Dr. Margette Fast (will be following up Dr. Leta Baptist)   Encounter Date: 02/03/2021   OT End of Session - 02/03/21 1937     Visit Number 2    Number of Visits 10    Date for OT Re-Evaluation 03/12/21    Authorization Type UHC Medicare, follows Medicare guidelines    Authorization - Number of Visits 2    Progress Note Due on Visit 10    OT Start Time 0806    OT Stop Time 0845    OT Time Calculation (min) 39 min    Activity Tolerance Patient tolerated treatment well    Behavior During Therapy [facility] for tasks assessed/performed             Past Medical History:  Diagnosis Date   Adult BMI 50.0-59.9 kg/sq m    52.77   Anal fissure    Anxiety    Diabetes mellitus without complication (HCC)    borderline   GERD (gastroesophageal reflux disease)    30 years ago, maybe from peptic ulcer   H/O: gout    STABLE   Hepatic steatosis 10/01/11   History of kidney stones    Hyperlipidemia    Hypertension    Kidney tumor    right upper kidney tumor, monitoring   OSA on CPAP    [facility]   Parkinson disease ([place]) 01/04/2016   REM sleep behavior disorder 12/06/2016   Right ureteral stone    Ureteral calculus, right 06/12/2011   Vitamin D deficiency    Weakness     Past Surgical History:  Procedure Laterality Date   ANAL FISSURE REPAIR  10-12-2000   COLONOSCOPY N/A 07/01/2012   Procedure: COLONOSCOPY;  Surgeon: Lafayette Dragon, MD;  [facility];  Service: Endoscopy;  Laterality: N/A;   CYSTOSCOPY WITH RETROGRADE PYELOGRAM, URETEROSCOPY AND STENT PLACEMENT Bilateral 02/26/2013   Procedure: CYSTOSCOPY WITH RETROGRADE PYELOGRAM, URETEROSCOPY AND STENT PLACEMENT;  Surgeon: Alexis Frock, MD;  [facility];  Service: Urology;  Laterality: Bilateral;   HOLMIUM LASER APPLICATION Bilateral 90/24/0973   Procedure: HOLMIUM LASER APPLICATION;  Surgeon: Alexis Frock, MD;  [facility];  Service: Urology;  Laterality: Bilateral;   kidney stone removal     06/2011-stent placement   LEFT MEDIAL FEMORAL CONDYLE DEBRIDEMENT & DRILLING/ REMOVAL LOOSE BODY  09-15-2003   NASAL SEPTUM SURGERY  1985   STONE EXTRACTION WITH BASKET  06/12/2011   Procedure: STONE EXTRACTION WITH BASKET;  Surgeon: Claybon Jabs, MD;  Location: [facility];  Service: Urology;  Laterality: Right;    There were no vitals filed for this visit.   Subjective Assessment - 02/03/21 0809     Subjective  the thumb has a mind of its own    Pertinent History Parkinson's Disease.   PMH:  CAD, HTN, HDL, obesity, asthma, DM, CKD, vitamin D deficiency, REM sleep disorder, OSA, aortic atherosclerosis, hx of gout, GERD, hx of anxiety    Currently in Pain? No/denies             PWR! Hands (basic 4) with min cueing for incr movement amplitude R hand.  With palm on table, individual finger lifts with abduction/adduction, then individual finger lifts for incr MP ext. With min v.c.  for incr movement amplitude  Forearm gym each UE for incr wrist ROM/large amplitude movements at end range  Supination stretch with hammer with BUEs   Thumb abduction with digi-ext bilaterally (red resistance) for incr awareness of movement amplitude.  Isolated IP flex with MP ext followed by tendon glides with each hand.          OT Short Term Goals - 02/01/21 1435       OT SHORT TERM GOAL #1   Title --               OT Long Term Goals - 02/01/21 1434       OT LONG TERM GOAL #1   Title Pt will verbalize understanding of updated strategies for ADLs to incr ease/safety and decr risk of future complications (including positioning of L shoulder to decr risk of pain and cutting food).--check LTGs  03/12/21    Time 5    Period Weeks    Status New      OT LONG TERM GOAL #2   Title Pt will improve RUE functional reaching/coordination for ADLs/IADLs as shown by improving score on box and blocks test by at least 4.    Baseline 41 blocks    Time 5    Period Weeks    Status New      OT LONG TERM GOAL #3   Title Pt will improve R hand coordination for ADLs as shown by improving time on 9-hole peg test 4 sec.    Baseline 40.09sec    Time 5    Period Weeks    Status New      OT LONG TERM GOAL #4   Title Pt will improve bilateral hand coordination for ADLs as shown by fastening/unfastening 3 buttons in 30sec or less.    Baseline 33sec    Time 5    Period Weeks    Status New      OT LONG TERM GOAL #5   Title Pt will be independent with updates to PD-specific HEP.    Time 5    Period Weeks    Status New                   Plan - 02/03/21 4709     Clinical Impression Statement Pt demo incr R hand flexibility and decr rigidity at end of session with improved end range thumb abduction and MP ext.    OT Occupational Profile and History Detailed Assessment- Review of Records and additional review of physical, cognitive, psychosocial history related to current functional performance    Occupational performance deficits (Please refer to evaluation for details): ADL's;IADL's;Leisure    Body Structure / Function / Physical Skills ADL;Dexterity;Balance;Tone;Pain;UE functional use;Coordination;Flexibility;FMC;Mobility;Improper spinal/pelvic alignment;IADL;ROM    Rehab Potential Good    Clinical Decision Making Several treatment options, min-mod task modification necessary    Comorbidities Affecting Occupational Performance: May have comorbidities impacting occupational performance    Modification or Assistance to Complete Evaluation  Min-Moderate modification of tasks or assist with assess necessary to complete eval    OT Frequency 2x / week    OT Duration --   x5 weeks + eval (or  10 visits--over 6 weeks prn due to scheduling).   OT Treatment/Interventions Self-care/ADL training;DME and/or AE instruction;Balance training;Therapeutic activities;Therapeutic exercise;Passive range of motion;Functional Mobility Training;Neuromuscular education;Cryotherapy;Ultrasound;Moist Heat;Fluidtherapy;Paraffin;Energy conservation;Manual Therapy;Patient/family education;Electrical Stimulation    Plan large amplitude movements; coordination R hand    Consulted and Agree with Plan of Care Patient  Patient will benefit from skilled therapeutic intervention in order to improve the following deficits and impairments:   Body Structure / Function / Physical Skills: ADL, Dexterity, Balance, Tone, Pain, UE functional use, Coordination, Flexibility, FMC, Mobility, Improper spinal/pelvic alignment, IADL, ROM       Visit Diagnosis: Other symptoms and signs involving the nervous system  Other lack of coordination  Other symptoms and signs involving the musculoskeletal system  Abnormal posture  Tremor  Chronic left shoulder pain  Unsteadiness on feet  Other abnormalities of gait and mobility    Problem List Patient Active Problem List   Diagnosis Date Noted   Primary renal papillary carcinoma, right ([place]) 02/18/2020   Hepatic steatosis 02/18/2020   Major depression in remission ([place]) 11/11/2019   Central sleep apnea due to Cheyne-Stokes respiration 08/31/2019   Elevated PSA 08/13/2019   Hyperlipidemia associated with type 2 diabetes mellitus ([place]) 07/09/2019   Aortic atherosclerosis ([place]) by CXR on 06/10/2018 06/10/2018   Type 2 diabetes mellitus with stage 3a chronic kidney disease, without long-term current use of insulin ([place]) 01/21/2018   OSA treated with BiPAP 01/03/2017   REM sleep behavior disorder 12/06/2016   CKD stage 3 due to type 2 diabetes mellitus ([place]) 10/23/2016   Medication management 10/23/2016   Vitamin D deficiency 10/23/2016   Parkinson's  disease ([place]) 01/04/2016   Seasonal and perennial allergic rhinitis 06/21/2014   Asthma, mild intermittent, well-controlled 06/21/2014   Pseudomonas aeruginosa colonization 10/08/2012   CAD (coronary artery disease) 11/15/2011   Essential hypertension 11/15/2011   Morbid obesity with BMI of 40.0-44.9, adult ([place]) 11/15/2011    Lizbeth Feijoo, OT/L 02/03/2021, 2:43 PM  [facility] [address] [place], [place], [address] Phone: [phone]   Fax:  [phone]  Name: AITHAN FARRELLY MRN: [id number] Date of Birth: October 26, 1955  Vianne Bulls, OTR/L [facility] [address] [place], [place]  [address] [phone] phone [phone] 02/03/21 2:43 PM

## 2021-02-08 ENCOUNTER — Ambulatory Visit: Payer: Medicare Other | Attending: Internal Medicine | Admitting: Occupational Therapy

## 2021-02-08 ENCOUNTER — Encounter: Payer: Self-pay | Admitting: Occupational Therapy

## 2021-02-08 ENCOUNTER — Other Ambulatory Visit: Payer: Self-pay

## 2021-02-08 DIAGNOSIS — G8929 Other chronic pain: Secondary | ICD-10-CM | POA: Insufficient documentation

## 2021-02-08 DIAGNOSIS — R2681 Unsteadiness on feet: Secondary | ICD-10-CM | POA: Diagnosis not present

## 2021-02-08 DIAGNOSIS — R29898 Other symptoms and signs involving the musculoskeletal system: Secondary | ICD-10-CM | POA: Insufficient documentation

## 2021-02-08 DIAGNOSIS — M25512 Pain in left shoulder: Secondary | ICD-10-CM | POA: Insufficient documentation

## 2021-02-08 DIAGNOSIS — R29818 Other symptoms and signs involving the nervous system: Secondary | ICD-10-CM | POA: Diagnosis not present

## 2021-02-08 DIAGNOSIS — R251 Tremor, unspecified: Secondary | ICD-10-CM | POA: Insufficient documentation

## 2021-02-08 DIAGNOSIS — R278 Other lack of coordination: Secondary | ICD-10-CM | POA: Insufficient documentation

## 2021-02-08 DIAGNOSIS — R293 Abnormal posture: Secondary | ICD-10-CM | POA: Diagnosis not present

## 2021-02-08 DIAGNOSIS — R2689 Other abnormalities of gait and mobility: Secondary | ICD-10-CM | POA: Diagnosis not present

## 2021-02-08 DIAGNOSIS — M25612 Stiffness of left shoulder, not elsewhere classified: Secondary | ICD-10-CM | POA: Diagnosis not present

## 2021-02-08 NOTE — Therapy (Signed)
[facility] [address] [place], [place], [address] Phone: [phone]   Fax:  [phone]  Occupational Therapy Treatment  Patient Details  Name: Todd Rice MRN: [id number] Date of Birth: 09/20/1955 Referring Provider (OT): Dr. Margette Fast (will be following up Dr. Leta Baptist)   Encounter Date: 02/08/2021   OT End of Session - 02/08/21 0851     Visit Number 3    Number of Visits 10    Date for OT Re-Evaluation 03/12/21    Authorization Type UHC Medicare, follows Medicare guidelines    Authorization - Number of Visits 3    Progress Note Due on Visit 10    OT Start Time 0850    OT Stop Time 0930    OT Time Calculation (min) 40 min    Activity Tolerance Patient tolerated treatment well    Behavior During Therapy [facility] for tasks assessed/performed             Past Medical History:  Diagnosis Date   Adult BMI 50.0-59.9 kg/sq m    52.77   Anal fissure    Anxiety    Diabetes mellitus without complication (HCC)    borderline   GERD (gastroesophageal reflux disease)    30 years ago, maybe from peptic ulcer   H/O: gout    STABLE   Hepatic steatosis 10/01/11   History of kidney stones    Hyperlipidemia    Hypertension    Kidney tumor    right upper kidney tumor, monitoring   OSA on CPAP    [facility] [facility]   Parkinson disease ([place]) 01/04/2016   REM sleep behavior disorder 12/06/2016   Right ureteral stone    Ureteral calculus, right 06/12/2011   Vitamin D deficiency    Weakness     Past Surgical History:  Procedure Laterality Date   ANAL FISSURE REPAIR  10-12-2000   COLONOSCOPY N/A 07/01/2012   Procedure: COLONOSCOPY;  Surgeon: Lafayette Dragon, MD;  [facility]: [facility];  Service: Endoscopy;  Laterality: N/A;   CYSTOSCOPY WITH RETROGRADE PYELOGRAM, URETEROSCOPY AND STENT PLACEMENT Bilateral 02/26/2013   Procedure: CYSTOSCOPY WITH RETROGRADE PYELOGRAM, URETEROSCOPY AND STENT PLACEMENT;  Surgeon: Alexis Frock, MD;  Location: [facility];  Service: Urology;  Laterality: Bilateral;   HOLMIUM LASER APPLICATION Bilateral 27/10/2374   Procedure: HOLMIUM LASER APPLICATION;  Surgeon: Alexis Frock, MD;  Location: [facility];  Service: Urology;  Laterality: Bilateral;   kidney stone removal     06/2011-stent placement   LEFT MEDIAL FEMORAL CONDYLE DEBRIDEMENT & DRILLING/ REMOVAL LOOSE BODY  09-15-2003   NASAL SEPTUM SURGERY  1985   STONE EXTRACTION WITH BASKET  06/12/2011   Procedure: STONE EXTRACTION WITH BASKET;  Surgeon: Claybon Jabs, MD;  Location: [facility];  Service: Urology;  Laterality: Right;    There were no vitals filed for this visit.   Subjective Assessment - 02/08/21 0851     Subjective  doing ok    Pertinent History Parkinson's Disease.   PMH:  CAD, HTN, HDL, obesity, asthma, DM, CKD, vitamin D deficiency, REM sleep disorder, OSA, aortic atherosclerosis, hx of gout, GERD, hx of anxiety    Currently in Pain? No/denies                 PWR! Hands (basic 4) with min cueing for incr movement amplitude R hand.  With palm on table, gross finger adduction/abduction, individual finger lifts with abduction/adduction, then individual finger lifts for incr MP ext. With min  v.c. for incr movement amplitude  Sliding cards across table using PWR!  Hands with each hand with min difficulty.  Dealing cards with thumb with each hand with mod difficulty/cueing initially for large amplitude movements.    Standing, juggling 2 scarves with min cueing for coordination and focus on large amplitude movements.         OT Short Term Goals - 02/01/21 1435       OT SHORT TERM GOAL #1   Title --               OT Long Term Goals - 02/01/21 1434       OT LONG TERM GOAL #1   Title Pt will verbalize understanding of updated strategies for ADLs to incr ease/safety and decr risk of future complications (including positioning of L shoulder to decr risk of pain and cutting  food).--check LTGs 03/12/21    Time 5    Period Weeks    Status New      OT LONG TERM GOAL #2   Title Pt will improve RUE functional reaching/coordination for ADLs/IADLs as shown by improving score on box and blocks test by at least 4.    Baseline 41 blocks    Time 5    Period Weeks    Status New      OT LONG TERM GOAL #3   Title Pt will improve R hand coordination for ADLs as shown by improving time on 9-hole peg test 4 sec.    Baseline 40.09sec    Time 5    Period Weeks    Status New      OT LONG TERM GOAL #4   Title Pt will improve bilateral hand coordination for ADLs as shown by fastening/unfastening 3 buttons in 30sec or less.    Baseline 33sec    Time 5    Period Weeks    Status New      OT LONG TERM GOAL #5   Title Pt will be independent with updates to PD-specific HEP.    Time 5    Period Weeks    Status New                   Plan - 02/08/21 6387     Clinical Impression Statement Pt is progressing with improving coordination and decr rigidity.  Pt responds well to cueing for large amplitude movements.    OT Occupational Profile and History Detailed Assessment- Review of Records and additional review of physical, cognitive, psychosocial history related to current functional performance    Occupational performance deficits (Please refer to evaluation for details): ADL's;IADL's;Leisure    Body Structure / Function / Physical Skills ADL;Dexterity;Balance;Tone;Pain;UE functional use;Coordination;Flexibility;FMC;Mobility;Improper spinal/pelvic alignment;IADL;ROM    Rehab Potential Good    Clinical Decision Making Several treatment options, min-mod task modification necessary    Comorbidities Affecting Occupational Performance: May have comorbidities impacting occupational performance    Modification or Assistance to Complete Evaluation  Min-Moderate modification of tasks or assist with assess necessary to complete eval    OT Frequency 2x / week    OT Duration --    x5 weeks + eval (or 10 visits--over 6 weeks prn due to scheduling).   OT Treatment/Interventions Self-care/ADL training;DME and/or AE instruction;Balance training;Therapeutic activities;Therapeutic exercise;Passive range of motion;Functional Mobility Training;Neuromuscular education;Cryotherapy;Ultrasound;Moist Heat;Fluidtherapy;Paraffin;Energy conservation;Manual Therapy;Patient/family education;Electrical Stimulation    Plan large amplitude movements; coordination R hand    Consulted and Agree with Plan of Care Patient  Patient will benefit from skilled therapeutic intervention in order to improve the following deficits and impairments:   Body Structure / Function / Physical Skills: ADL, Dexterity, Balance, Tone, Pain, UE functional use, Coordination, Flexibility, FMC, Mobility, Improper spinal/pelvic alignment, IADL, ROM       Visit Diagnosis: Other symptoms and signs involving the nervous system  Other lack of coordination  Other symptoms and signs involving the musculoskeletal system  Abnormal posture  Tremor  Chronic left shoulder pain  Unsteadiness on feet    Problem List Patient Active Problem List   Diagnosis Date Noted   Primary renal papillary carcinoma, right ([place]) 02/18/2020   Hepatic steatosis 02/18/2020   Major depression in remission ([place]) 11/11/2019   Central sleep apnea due to Cheyne-Stokes respiration 08/31/2019   Elevated PSA 08/13/2019   Hyperlipidemia associated with type 2 diabetes mellitus ([place]) 07/09/2019   Aortic atherosclerosis ([place]) by CXR on 06/10/2018 06/10/2018   Type 2 diabetes mellitus with stage 3a chronic kidney disease, without long-term current use of insulin ([place]) 01/21/2018   OSA treated with BiPAP 01/03/2017   REM sleep behavior disorder 12/06/2016   CKD stage 3 due to type 2 diabetes mellitus ([place]) 10/23/2016   Medication management 10/23/2016   Vitamin D deficiency 10/23/2016   Parkinson's disease ([place])  01/04/2016   Seasonal and perennial allergic rhinitis 06/21/2014   Asthma, mild intermittent, well-controlled 06/21/2014   Pseudomonas aeruginosa colonization 10/08/2012   CAD (coronary artery disease) 11/15/2011   Essential hypertension 11/15/2011   Morbid obesity with BMI of 40.0-44.9, adult ([place]) 11/15/2011    Naquita Nappier, OT/L 02/08/2021, 9:49 AM  [facility] [address] [place], [place], [address] Phone: [phone]   Fax:  [phone]  Name: JAYQUAN BRADSHER MRN: [id number] Date of Birth: 03/27/1956  Vianne Bulls, OTR/L [facility] [address] [place], [place]  [address] [phone] phone [phone] 02/08/21 9:49 AM

## 2021-02-10 ENCOUNTER — Ambulatory Visit: Payer: Medicare Other | Admitting: Occupational Therapy

## 2021-02-10 ENCOUNTER — Other Ambulatory Visit: Payer: Self-pay

## 2021-02-10 ENCOUNTER — Encounter: Payer: Self-pay | Admitting: Occupational Therapy

## 2021-02-10 ENCOUNTER — Ambulatory Visit (INDEPENDENT_AMBULATORY_CARE_PROVIDER_SITE_OTHER): Payer: Medicare Other

## 2021-02-10 VITALS — Temp 97.7°F

## 2021-02-10 DIAGNOSIS — R278 Other lack of coordination: Secondary | ICD-10-CM | POA: Diagnosis not present

## 2021-02-10 DIAGNOSIS — R29898 Other symptoms and signs involving the musculoskeletal system: Secondary | ICD-10-CM | POA: Diagnosis not present

## 2021-02-10 DIAGNOSIS — M25512 Pain in left shoulder: Secondary | ICD-10-CM

## 2021-02-10 DIAGNOSIS — R2689 Other abnormalities of gait and mobility: Secondary | ICD-10-CM | POA: Diagnosis not present

## 2021-02-10 DIAGNOSIS — Z23 Encounter for immunization: Secondary | ICD-10-CM | POA: Diagnosis not present

## 2021-02-10 DIAGNOSIS — R251 Tremor, unspecified: Secondary | ICD-10-CM | POA: Diagnosis not present

## 2021-02-10 DIAGNOSIS — R2681 Unsteadiness on feet: Secondary | ICD-10-CM | POA: Diagnosis not present

## 2021-02-10 DIAGNOSIS — M25612 Stiffness of left shoulder, not elsewhere classified: Secondary | ICD-10-CM | POA: Diagnosis not present

## 2021-02-10 DIAGNOSIS — R293 Abnormal posture: Secondary | ICD-10-CM | POA: Diagnosis not present

## 2021-02-10 DIAGNOSIS — G8929 Other chronic pain: Secondary | ICD-10-CM

## 2021-02-10 DIAGNOSIS — R29818 Other symptoms and signs involving the nervous system: Secondary | ICD-10-CM | POA: Diagnosis not present

## 2021-02-10 NOTE — Therapy (Signed)
[facility] [address] [place], [place], [address] Phone: [phone]   Fax:  [phone]  Occupational Therapy Treatment  Patient Details  Name: Todd Rice MRN: [id number] Date of Birth: August 16, 1955 Referring Provider (OT): Dr. Margette Fast (will be following up Dr. Leta Baptist)   Encounter Date: 02/10/2021   OT End of Session - 02/10/21 0816     Visit Number 4    Number of Visits 10    Date for OT Re-Evaluation 03/12/21    Authorization Type UHC Medicare, follows Medicare guidelines    Authorization - Number of Visits 4    Progress Note Due on Visit 10    OT Start Time 0808    OT Stop Time 0846    OT Time Calculation (min) 38 min    Activity Tolerance Patient tolerated treatment well    Behavior During Therapy [facility] for tasks assessed/performed             Past Medical History:  Diagnosis Date   Adult BMI 50.0-59.9 kg/sq m    52.77   Anal fissure    Anxiety    Diabetes mellitus without complication (HCC)    borderline   GERD (gastroesophageal reflux disease)    30 years ago, maybe from peptic ulcer   H/O: gout    STABLE   Hepatic steatosis 10/01/11   History of kidney stones    Hyperlipidemia    Hypertension    Kidney tumor    right upper kidney tumor, monitoring   OSA on CPAP    [employer]   Parkinson disease ([place]) 01/04/2016   REM sleep behavior disorder 12/06/2016   Right ureteral stone    Ureteral calculus, right 06/12/2011   Vitamin D deficiency    Weakness     Past Surgical History:  Procedure Laterality Date   ANAL FISSURE REPAIR  10-12-2000   COLONOSCOPY N/A 07/01/2012   Procedure: COLONOSCOPY;  Surgeon: Lafayette Dragon, MD;  [facility];  Service: Endoscopy;  Laterality: N/A;   CYSTOSCOPY WITH RETROGRADE PYELOGRAM, URETEROSCOPY AND STENT PLACEMENT Bilateral 02/26/2013   Procedure: CYSTOSCOPY WITH RETROGRADE PYELOGRAM, URETEROSCOPY AND STENT PLACEMENT;  Surgeon: Alexis Frock, MD;  [facility];  Service: Urology;  Laterality: Bilateral;   HOLMIUM LASER APPLICATION Bilateral 28/36/6294   Procedure: HOLMIUM LASER APPLICATION;  Surgeon: Alexis Frock, MD;  [facility];  Service: Urology;  Laterality: Bilateral;   kidney stone removal     06/2011-stent placement   LEFT MEDIAL FEMORAL CONDYLE DEBRIDEMENT & DRILLING/ REMOVAL LOOSE BODY  09-15-2003   NASAL SEPTUM SURGERY  1985   STONE EXTRACTION WITH BASKET  06/12/2011   Procedure: STONE EXTRACTION WITH BASKET;  Surgeon: Claybon Jabs, MD;  Location: [facility];  Service: Urology;  Laterality: Right;    There were no vitals filed for this visit.   Subjective Assessment - 02/10/21 0809     Subjective  slept wrong on L shoulder last night    Pertinent History Parkinson's Disease.   PMH:  CAD, HTN, HDL, obesity, asthma, DM, CKD, vitamin D deficiency, REM sleep disorder, OSA, aortic atherosclerosis, hx of gout, GERD, hx of anxiety    Currently in Pain? Yes    Pain Score 2     Pain Location Shoulder    Pain Orientation Left    Pain Descriptors / Indicators Aching    Pain Type Chronic pain    Pain Onset Today    Pain Frequency Intermittent  Aggravating Factors  lay down on stomach    Pain Relieving Factors stretching             Standing, shoulder flex with ball with BUEs and then diagonals to each side for stretch incorporating trunk rotation and wt. Shift.  Pulling bag into palm using large amplitude finger movements with min-mod cueing and difficulty, particularly with R hand   Standing, functional reaching in ER to flip cards using large amplitude movements with cueing for supination, trunk rotation/wt. Shift, finger ext, and elbow ext with RUE.  Picking up coins using each finger/thumb and PWR! Hands with each hand with min cues/difficulty  Picking up coins until 5 in hand and manipulating in hand to translate to fingertips with each hand with min difficulty.  With both  hands:  with palm on table, gross finger adduction/abduction, individual finger lifts with abduction/adduction, then isolatedindividual finger lifts for incr MP ext. With min v.c. for incr movement amplitude.  Isolated IP flexion/ext and isolated MP flex/ext for decr rigidity of hands with min v.c.          OT Short Term Goals - 02/01/21 1435       OT SHORT TERM GOAL #1   Title --               OT Long Term Goals - 02/01/21 1434       OT LONG TERM GOAL #1   Title Pt will verbalize understanding of updated strategies for ADLs to incr ease/safety and decr risk of future complications (including positioning of L shoulder to decr risk of pain and cutting food).--check LTGs 03/12/21    Time 5    Period Weeks    Status New      OT LONG TERM GOAL #2   Title Pt will improve RUE functional reaching/coordination for ADLs/IADLs as shown by improving score on box and blocks test by at least 4.    Baseline 41 blocks    Time 5    Period Weeks    Status New      OT LONG TERM GOAL #3   Title Pt will improve R hand coordination for ADLs as shown by improving time on 9-hole peg test 4 sec.    Baseline 40.09sec    Time 5    Period Weeks    Status New      OT LONG TERM GOAL #4   Title Pt will improve bilateral hand coordination for ADLs as shown by fastening/unfastening 3 buttons in 30sec or less.    Baseline 33sec    Time 5    Period Weeks    Status New      OT LONG TERM GOAL #5   Title Pt will be independent with updates to PD-specific HEP.    Time 5    Period Weeks    Status New                   Plan - 02/10/21 0818     Clinical Impression Statement Pt is progressing with improving coordination and decr rigidity.  Pt continues to respond well to cueing for large amplitude movements and repetition.    OT Occupational Profile and History Detailed Assessment- Review of Records and additional review of physical, cognitive, psychosocial history related to current  functional performance    Occupational performance deficits (Please refer to evaluation for details): ADL's;IADL's;Leisure    Body Structure / Function / Physical Skills ADL;Dexterity;Balance;Tone;Pain;UE functional use;Coordination;Flexibility;FMC;Mobility;Improper spinal/pelvic alignment;IADL;ROM  Rehab Potential Good    Clinical Decision Making Several treatment options, min-mod task modification necessary    Comorbidities Affecting Occupational Performance: May have comorbidities impacting occupational performance    Modification or Assistance to Complete Evaluation  Min-Moderate modification of tasks or assist with assess necessary to complete eval    OT Frequency 2x / week    OT Duration --   x5 weeks + eval (or 10 visits--over 6 weeks prn due to scheduling).   OT Treatment/Interventions Self-care/ADL training;DME and/or AE instruction;Balance training;Therapeutic activities;Therapeutic exercise;Passive range of motion;Functional Mobility Training;Neuromuscular education;Cryotherapy;Ultrasound;Moist Heat;Fluidtherapy;Paraffin;Energy conservation;Manual Therapy;Patient/family education;Electrical Stimulation    Plan large amplitude movements; coordination R hand    Consulted and Agree with Plan of Care Patient             Patient will benefit from skilled therapeutic intervention in order to improve the following deficits and impairments:   Body Structure / Function / Physical Skills: ADL, Dexterity, Balance, Tone, Pain, UE functional use, Coordination, Flexibility, FMC, Mobility, Improper spinal/pelvic alignment, IADL, ROM       Visit Diagnosis: Other symptoms and signs involving the nervous system  Other lack of coordination  Other symptoms and signs involving the musculoskeletal system  Abnormal posture  Tremor  Chronic left shoulder pain  Unsteadiness on feet  Other abnormalities of gait and mobility    Problem List Patient Active Problem List   Diagnosis Date  Noted   Primary renal papillary carcinoma, right ([place]) 02/18/2020   Hepatic steatosis 02/18/2020   Major depression in remission ([place]) 11/11/2019   Central sleep apnea due to Cheyne-Stokes respiration 08/31/2019   Elevated PSA 08/13/2019   Hyperlipidemia associated with type 2 diabetes mellitus ([place]) 07/09/2019   Aortic atherosclerosis ([place]) by CXR on 06/10/2018 06/10/2018   Type 2 diabetes mellitus with stage 3a chronic kidney disease, without long-term current use of insulin ([place]) 01/21/2018   OSA treated with BiPAP 01/03/2017   REM sleep behavior disorder 12/06/2016   CKD stage 3 due to type 2 diabetes mellitus ([place]) 10/23/2016   Medication management 10/23/2016   Vitamin D deficiency 10/23/2016   Parkinson's disease ([place]) 01/04/2016   Seasonal and perennial allergic rhinitis 06/21/2014   Asthma, mild intermittent, well-controlled 06/21/2014   Pseudomonas aeruginosa colonization 10/08/2012   CAD (coronary artery disease) 11/15/2011   Essential hypertension 11/15/2011   Morbid obesity with BMI of 40.0-44.9, adult ([place]) 11/15/2011    Solangel Mcmanaway, OT/L 02/10/2021, 1:55 PM  [facility] [address] [place], [place], [address] Phone: [phone]   Fax:  [phone]  Name: BERND CROM MRN: [id number] Date of Birth: [birth year]  Vianne Bulls, OTR/L [facility] [address]. [place], [place]  [address] [phone] phone [phone] 02/10/21 1:55 PM

## 2021-02-10 NOTE — Progress Notes (Signed)
Patient presents today to receive flu vaccine

## 2021-02-14 ENCOUNTER — Other Ambulatory Visit (HOSPITAL_BASED_OUTPATIENT_CLINIC_OR_DEPARTMENT_OTHER): Payer: Self-pay

## 2021-02-14 ENCOUNTER — Other Ambulatory Visit (HOSPITAL_COMMUNITY): Payer: Self-pay

## 2021-02-15 ENCOUNTER — Other Ambulatory Visit (HOSPITAL_BASED_OUTPATIENT_CLINIC_OR_DEPARTMENT_OTHER): Payer: Self-pay

## 2021-02-16 ENCOUNTER — Other Ambulatory Visit (HOSPITAL_BASED_OUTPATIENT_CLINIC_OR_DEPARTMENT_OTHER): Payer: Self-pay

## 2021-02-17 ENCOUNTER — Encounter: Payer: Medicare Other | Admitting: Adult Health

## 2021-02-22 ENCOUNTER — Other Ambulatory Visit: Payer: Self-pay

## 2021-02-22 ENCOUNTER — Ambulatory Visit: Payer: Medicare Other | Admitting: Occupational Therapy

## 2021-02-22 ENCOUNTER — Encounter: Payer: Self-pay | Admitting: Occupational Therapy

## 2021-02-22 DIAGNOSIS — R29898 Other symptoms and signs involving the musculoskeletal system: Secondary | ICD-10-CM

## 2021-02-22 DIAGNOSIS — R293 Abnormal posture: Secondary | ICD-10-CM | POA: Diagnosis not present

## 2021-02-22 DIAGNOSIS — R251 Tremor, unspecified: Secondary | ICD-10-CM | POA: Diagnosis not present

## 2021-02-22 DIAGNOSIS — R29818 Other symptoms and signs involving the nervous system: Secondary | ICD-10-CM

## 2021-02-22 DIAGNOSIS — R2689 Other abnormalities of gait and mobility: Secondary | ICD-10-CM

## 2021-02-22 DIAGNOSIS — R278 Other lack of coordination: Secondary | ICD-10-CM

## 2021-02-22 DIAGNOSIS — M25612 Stiffness of left shoulder, not elsewhere classified: Secondary | ICD-10-CM

## 2021-02-22 DIAGNOSIS — M25512 Pain in left shoulder: Secondary | ICD-10-CM | POA: Diagnosis not present

## 2021-02-22 DIAGNOSIS — R2681 Unsteadiness on feet: Secondary | ICD-10-CM

## 2021-02-22 DIAGNOSIS — G8929 Other chronic pain: Secondary | ICD-10-CM | POA: Diagnosis not present

## 2021-02-22 NOTE — Patient Instructions (Addendum)
PWR! Hand Exercises   Then, start with elbows bent and hands closed:   PWR! Hands: Push hands out BIG. Elbows straight, wrists up, fingers open and spread apart BIG.  PWR! Step: Touch index finger to thumb while keeping other fingers straight. Flick fingers out BIG (thumb out/straighten fingers). Repeat with other fingers. (Step your thumb to each finger).   With arms stretched out in front of you (elbows straight), perform the following: PWR! Rock:  Move wrists up and down [employer]! Twist: Twist palms up and down BIG   ** Make each movement big and deliberate so that you feel the movement.   Perform at least 10 repetitions 1x/day, but perform PWR! Hands throughout the day when you are having trouble using your hands (picking up/manipulating small objects, writing, eating, typing, sewing, buttoning, etc.).         Coordination Exercises   Perform the following exercises for 20 minutes 1 times per day. Perform with both hand(s). Perform using big movements.   Flipping Cards: Place deck of cards on the table. Flip cards over by opening your hand big to grasp and then turn your palm up big, opening hand fully to release. Deal cards: Hold 1/2 or whole deck in your hand. Use thumb to push card off top of deck with one big push. Flip card between each finger. Place card on tabletop.  Then flick fingers (extend fingers) powerfully to slide card off table (can have chair/box below table to catch the cards). Rotate ball with fingertips: Pick up with fingers/thumb and move as much as you can with each turn/movement (clockwise and counter-clockwise). Ball push-ups.  Hold ball in fingertips and then bring down to palm and push back up. Toss ball from one hand to the other: Toss big/high.  Deliberately open with toss and deliberately close hand after catch. Toss ball in the air and catch with the same hand: Toss big/high.  Deliberately open with toss and deliberately close hand after catch. Juggle 2  balls: Do not go fast. Pause after each toss. Rotate 2 golf balls in your hand: Both directions. Pick up coins and stack one at a time: Open hand and Pick up with big, intentional movements. Do not drag coin to the edge. (5-10 in a stack) Pick up 5-10 coins one at a time and hold in palm. Then, move coins from palm to fingertips one at time and place in coin bank/container. Open hand big and pick up coins with each finger and thumb Practice writing: Slow down, write big, and focus on forming each letter.   Finger Walk     With palm flat on table and held steady, lift ingers one by one toward thumb. Then lift one by one to go back to original position.  Repeat 10 times. Do 1 sessions per day.  Isolated Finger Lift    Place elbows at sides or rest them on chair arms, with palms facing down. Starting with little fingers, raise and lower the same finger on both hands. Do all fingers and thumbs in turn. Repeat 10 times. Do 1 sessions per day.   Flexor Tendon Gliding (Active Hook Fist)   With fingers and knuckles straight, bend middle and tip joints. Do not bend large knuckles. Repeat 10 times. Do 1 sessions per day.

## 2021-02-22 NOTE — Therapy (Signed)
[facility] [address] [place], [place], [address] Phone: [phone]   Fax:  [phone]  Occupational Therapy Treatment  Patient Details  Name: Todd Rice MRN: [id number] Date of Birth: 1955-09-05 Referring Provider (OT): Dr. Margette Fast (will be following up Dr. Leta Baptist)   Encounter Date: 02/22/2021   OT End of Session - 02/22/21 0734     Visit Number 5    Number of Visits 10    Date for OT Re-Evaluation 03/12/21    Authorization Type UHC Medicare, follows Medicare guidelines    Authorization - Number of Visits 5    Progress Note Due on Visit 10    OT Start Time 0720    OT Stop Time 0800    OT Time Calculation (min) 40 min    Activity Tolerance Patient tolerated treatment well    Behavior During Therapy [facility] for tasks assessed/performed             Past Medical History:  Diagnosis Date   Adult BMI 50.0-59.9 kg/sq m    52.77   Anal fissure    Anxiety    Diabetes mellitus without complication (HCC)    borderline   GERD (gastroesophageal reflux disease)    30 years ago, maybe from peptic ulcer   H/O: gout    STABLE   Hepatic steatosis 10/01/11   History of kidney stones    Hyperlipidemia    Hypertension    Kidney tumor    right upper kidney tumor, monitoring   OSA on CPAP    [employer] [facility]   Parkinson disease ([place]) 01/04/2016   REM sleep behavior disorder 12/06/2016   Right ureteral stone    Ureteral calculus, right 06/12/2011   Vitamin D deficiency    Weakness     Past Surgical History:  Procedure Laterality Date   ANAL FISSURE REPAIR  10-12-2000   COLONOSCOPY N/A 07/01/2012   Procedure: COLONOSCOPY;  Surgeon: Lafayette Dragon, MD;  Location: [facility];  Service: Endoscopy;  Laterality: N/A;   CYSTOSCOPY WITH RETROGRADE PYELOGRAM, URETEROSCOPY AND STENT PLACEMENT Bilateral 02/26/2013   Procedure: CYSTOSCOPY WITH RETROGRADE PYELOGRAM, URETEROSCOPY AND STENT PLACEMENT;  Surgeon:  Alexis Frock, MD;  Location: [facility];  Service: Urology;  Laterality: Bilateral;   HOLMIUM LASER APPLICATION Bilateral 26/94/8546   Procedure: HOLMIUM LASER APPLICATION;  Surgeon: Alexis Frock, MD;  Location: [facility];  Service: Urology;  Laterality: Bilateral;   kidney stone removal     06/2011-stent placement   LEFT MEDIAL FEMORAL CONDYLE DEBRIDEMENT & DRILLING/ REMOVAL LOOSE BODY  09-15-2003   NASAL SEPTUM SURGERY  1985   STONE EXTRACTION WITH BASKET  06/12/2011   Procedure: STONE EXTRACTION WITH BASKET;  Surgeon: Claybon Jabs, MD;  Location: [facility];  Service: Urology;  Laterality: Right;    There were no vitals filed for this visit.   Subjective Assessment - 02/22/21 0723     Subjective  slept wrong on L shoulder last night    Pertinent History Parkinson's Disease.   PMH:  CAD, HTN, HDL, obesity, asthma, DM, CKD, vitamin D deficiency, REM sleep disorder, OSA, aortic atherosclerosis, hx of gout, GERD, hx of anxiety    Currently in Pain? No/denies    Pain Onset Today              Tossing/catching medium ball with BUEs with min cueing for step and toss for wt. Shift and large amplitude hand/UE movements.  Standing with BUEs and performing large amplitude  movements with towel up/down for incr posture and shoulder flex.  Then, large amplitude movements with trunk rotation/wt. Shift with BUEs to hit punching bag with towel with min cueing for large amplitude movements, elbow ext.   With both hands:  with palm on table, individual finger lifts with abduction/adduction, then isolated/individual finger lifts for incr MP ext. With min v.c. for incr movement amplitude.  Isolated IP flexion/ext for decr rigidity of hands with min v.c.   Coordination activities with each hand with cueing for large amplitude movements:    Rotating ball in fingertips with each hand with mod difficulty R hand and min difficulty with L.    Ball push-ups with each hand with min-mod  difficulty with R and min difficulty with L.  Translating medium peg from palm>fingertips and back, then flipping between each finger with min-mod difficulty R hand and min difficulty L hand  With plam on table, finger flex/ext towel scrunches with each hand.           OT Education - 02/22/21 0815     Education Details Updates to HEP for coordination and hands--see pt instructions    Person(s) Educated Patient    Methods Explanation;Demonstration;Handout    Comprehension Verbalized understanding;Returned demonstration;Verbal cues required              OT Short Term Goals - 02/01/21 1435       OT SHORT TERM GOAL #1   Title --               OT Long Term Goals - 02/01/21 1434       OT LONG TERM GOAL #1   Title Pt will verbalize understanding of updated strategies for ADLs to incr ease/safety and decr risk of future complications (including positioning of L shoulder to decr risk of pain and cutting food).--check LTGs 03/12/21    Time 5    Period Weeks    Status New      OT LONG TERM GOAL #2   Title Pt will improve RUE functional reaching/coordination for ADLs/IADLs as shown by improving score on box and blocks test by at least 4.    Baseline 41 blocks    Time 5    Period Weeks    Status New      OT LONG TERM GOAL #3   Title Pt will improve R hand coordination for ADLs as shown by improving time on 9-hole peg test 4 sec.    Baseline 40.09sec    Time 5    Period Weeks    Status New      OT LONG TERM GOAL #4   Title Pt will improve bilateral hand coordination for ADLs as shown by fastening/unfastening 3 buttons in 30sec or less.    Baseline 33sec    Time 5    Period Weeks    Status New      OT LONG TERM GOAL #5   Title Pt will be independent with updates to PD-specific HEP.    Time 5    Period Weeks    Status New                   Plan - 02/22/21 0735     Clinical Impression Statement Pt is progressing with improving coordination and  decr rigidity.  Pt continues to respond well to cueing for large amplitude movements and repetition.    OT Occupational Profile and History Detailed Assessment- Review of Records and additional review of physical,  cognitive, psychosocial history related to current functional performance    Occupational performance deficits (Please refer to evaluation for details): ADL's;IADL's;Leisure    Body Structure / Function / Physical Skills ADL;Dexterity;Balance;Tone;Pain;UE functional use;Coordination;Flexibility;FMC;Mobility;Improper spinal/pelvic alignment;IADL;ROM    Rehab Potential Good    Clinical Decision Making Several treatment options, min-mod task modification necessary    Comorbidities Affecting Occupational Performance: May have comorbidities impacting occupational performance    Modification or Assistance to Complete Evaluation  Min-Moderate modification of tasks or assist with assess necessary to complete eval    OT Frequency 2x / week    OT Duration --   x5 weeks + eval (or 10 visits--over 6 weeks prn due to scheduling).   OT Treatment/Interventions Self-care/ADL training;DME and/or AE instruction;Balance training;Therapeutic activities;Therapeutic exercise;Passive range of motion;Functional Mobility Training;Neuromuscular education;Cryotherapy;Ultrasound;Moist Heat;Fluidtherapy;Paraffin;Energy conservation;Manual Therapy;Patient/family education;Electrical Stimulation    Plan large amplitude movements; coordination R hand    Consulted and Agree with Plan of Care Patient             Patient will benefit from skilled therapeutic intervention in order to improve the following deficits and impairments:   Body Structure / Function / Physical Skills: ADL, Dexterity, Balance, Tone, Pain, UE functional use, Coordination, Flexibility, FMC, Mobility, Improper spinal/pelvic alignment, IADL, ROM       Visit Diagnosis: Other symptoms and signs involving the nervous system  Other lack of  coordination  Other symptoms and signs involving the musculoskeletal system  Abnormal posture  Tremor  Unsteadiness on feet  Other abnormalities of gait and mobility  Stiffness of left shoulder, not elsewhere classified    Problem List Patient Active Problem List   Diagnosis Date Noted   Primary renal papillary carcinoma, right ([place]) 02/18/2020   Hepatic steatosis 02/18/2020   Major depression in remission ([place]) 11/11/2019   Central sleep apnea due to Cheyne-Stokes respiration 08/31/2019   Elevated PSA 08/13/2019   Hyperlipidemia associated with type 2 diabetes mellitus ([place]) 07/09/2019   Aortic atherosclerosis ([address]) by CXR on 06/10/2018 06/10/2018   Type 2 diabetes mellitus with stage 3a chronic kidney disease, without long-term current use of insulin ([place]) 01/21/2018   OSA treated with BiPAP 01/03/2017   REM sleep behavior disorder 12/06/2016   CKD stage 3 due to type 2 diabetes mellitus ([place]) 10/23/2016   Medication management 10/23/2016   Vitamin D deficiency 10/23/2016   Parkinson's disease ([place]) 01/04/2016   Seasonal and perennial allergic rhinitis 06/21/2014   Asthma, mild intermittent, well-controlled 06/21/2014   Pseudomonas aeruginosa colonization 10/08/2012   CAD (coronary artery disease) 11/15/2011   Essential hypertension 11/15/2011   Morbid obesity with BMI of 40.0-44.9, adult ([place]) 11/15/2011    Mlissa Tamayo, OT/L 02/22/2021, 8:22 AM  [facility] [address] [place], [place], [address] Phone: [phone]   Fax:  [phone]  Name: ZUBAIR LOFTON MRN: [id number] Date of Birth: June 11, 1955  Vianne Bulls, OTR/L [facility] [address] [place], [place]  [address] [phone] phone [phone] 02/22/21 8:22 AM

## 2021-02-24 ENCOUNTER — Encounter: Payer: Self-pay | Admitting: Occupational Therapy

## 2021-02-24 ENCOUNTER — Ambulatory Visit: Payer: Medicare Other | Admitting: Occupational Therapy

## 2021-02-24 ENCOUNTER — Other Ambulatory Visit: Payer: Self-pay

## 2021-02-24 DIAGNOSIS — M25612 Stiffness of left shoulder, not elsewhere classified: Secondary | ICD-10-CM

## 2021-02-24 DIAGNOSIS — R251 Tremor, unspecified: Secondary | ICD-10-CM | POA: Diagnosis not present

## 2021-02-24 DIAGNOSIS — R29818 Other symptoms and signs involving the nervous system: Secondary | ICD-10-CM

## 2021-02-24 DIAGNOSIS — R2681 Unsteadiness on feet: Secondary | ICD-10-CM | POA: Diagnosis not present

## 2021-02-24 DIAGNOSIS — R29898 Other symptoms and signs involving the musculoskeletal system: Secondary | ICD-10-CM

## 2021-02-24 DIAGNOSIS — R2689 Other abnormalities of gait and mobility: Secondary | ICD-10-CM | POA: Diagnosis not present

## 2021-02-24 DIAGNOSIS — M25512 Pain in left shoulder: Secondary | ICD-10-CM | POA: Diagnosis not present

## 2021-02-24 DIAGNOSIS — R278 Other lack of coordination: Secondary | ICD-10-CM | POA: Diagnosis not present

## 2021-02-24 DIAGNOSIS — R293 Abnormal posture: Secondary | ICD-10-CM | POA: Diagnosis not present

## 2021-02-24 DIAGNOSIS — G8929 Other chronic pain: Secondary | ICD-10-CM

## 2021-02-24 NOTE — Therapy (Signed)
[facility] [address] [place], [place], [address] Phone: [phone]   Fax:  [phone]  Occupational Therapy Treatment  Patient Details  Name: Todd Rice MRN: [id number] Date of Birth: [birth year] Referring Provider (OT): Dr. Margette Fast (will be following up Dr. Leta Baptist)   Encounter Date: 02/24/2021   OT End of Session - 02/24/21 0726     Visit Number 6    Number of Visits 10    Date for OT Re-Evaluation 03/12/21    Authorization Type UHC Medicare, follows Medicare guidelines    Authorization - Number of Visits 6    Progress Note Due on Visit 10    OT Start Time 0719    OT Stop Time 0800    OT Time Calculation (min) 41 min    Activity Tolerance Patient tolerated treatment well    Behavior During Therapy [facility] for tasks assessed/performed             Past Medical History:  Diagnosis Date   Adult BMI 50.0-59.9 kg/sq m    52.77   Anal fissure    Anxiety    Diabetes mellitus without complication (HCC)    borderline   GERD (gastroesophageal reflux disease)    30 years ago, maybe from peptic ulcer   H/O: gout    STABLE   Hepatic steatosis 10/01/11   History of kidney stones    Hyperlipidemia    Hypertension    Kidney tumor    right upper kidney tumor, monitoring   OSA on CPAP    [employer] [facility]   Parkinson disease ([place]) 01/04/2016   REM sleep behavior disorder 12/06/2016   Right ureteral stone    Ureteral calculus, right 06/12/2011   Vitamin D deficiency    Weakness     Past Surgical History:  Procedure Laterality Date   ANAL FISSURE REPAIR  10-12-2000   COLONOSCOPY N/A 07/01/2012   Procedure: COLONOSCOPY;  Surgeon: Lafayette Dragon, MD;  [facility];  Service: Endoscopy;  Laterality: N/A;   CYSTOSCOPY WITH RETROGRADE PYELOGRAM, URETEROSCOPY AND STENT PLACEMENT Bilateral 02/26/2013   Procedure: CYSTOSCOPY WITH RETROGRADE PYELOGRAM, URETEROSCOPY AND STENT PLACEMENT;  Surgeon:  Alexis Frock, MD;  [facility];  Service: Urology;  Laterality: Bilateral;   HOLMIUM LASER APPLICATION Bilateral 41/66/0630   Procedure: HOLMIUM LASER APPLICATION;  Surgeon: Alexis Frock, MD;  [facility];  Service: Urology;  Laterality: Bilateral;   kidney stone removal     06/2011-stent placement   LEFT MEDIAL FEMORAL CONDYLE DEBRIDEMENT & DRILLING/ REMOVAL LOOSE BODY  09-15-2003   NASAL SEPTUM SURGERY  1985   STONE EXTRACTION WITH BASKET  06/12/2011   Procedure: STONE EXTRACTION WITH BASKET;  Surgeon: Claybon Jabs, MD;  Location: [facility];  Service: Urology;  Laterality: Right;    There were no vitals filed for this visit.   Subjective Assessment - 02/24/21 0724     Subjective  twisted by back a little yesterday turning over in bed--"it will be ok"    Pertinent History Parkinson's Disease.   PMH:  CAD, HTN, HDL, obesity, asthma, DM, CKD, vitamin D deficiency, REM sleep disorder, OSA, aortic atherosclerosis, hx of gout, GERD, hx of anxiety    Currently in Pain? No/denies    Pain Onset Today              Bilateral hand coordination and timing:  flipping cards with BUEs simultaneously with min difficulty/cueing, sliding cards across table for PWR! Hands simultaneously  with min difficulty.  Standing, tossing beanbags with each hand to target with min difficulty/cueing for large amplitude movements.    Completing [place] pegboard with BUEs simultaneously for bilateral coordination and timing with min cueing for large amplitude and timing.  Practiced simulated cutting food (putty) with fork/knife with min cueing for large amplitude movement techniques.  Practiced fastening/unfastening buttons with BUEs using adaptive strategies/large amplitude movement techniques with min cueing.         OT Short Term Goals - 02/01/21 1435       OT SHORT TERM GOAL #1   Title --               OT Long Term Goals - 02/01/21 1434       OT LONG TERM  GOAL #1   Title Pt will verbalize understanding of updated strategies for ADLs to incr ease/safety and decr risk of future complications (including positioning of L shoulder to decr risk of pain and cutting food).--check LTGs 03/12/21    Time 5    Period Weeks    Status New      OT LONG TERM GOAL #2   Title Pt will improve RUE functional reaching/coordination for ADLs/IADLs as shown by improving score on box and blocks test by at least 4.    Baseline 41 blocks    Time 5    Period Weeks    Status New      OT LONG TERM GOAL #3   Title Pt will improve R hand coordination for ADLs as shown by improving time on 9-hole peg test 4 sec.    Baseline 40.09sec    Time 5    Period Weeks    Status New      OT LONG TERM GOAL #4   Title Pt will improve bilateral hand coordination for ADLs as shown by fastening/unfastening 3 buttons in 30sec or less.    Baseline 33sec    Time 5    Period Weeks    Status New      OT LONG TERM GOAL #5   Title Pt will be independent with updates to PD-specific HEP.    Time 5    Period Weeks    Status New                   Plan - 02/24/21 0727     Clinical Impression Statement Pt is progressing with bilateral hand coordination and timing.    OT Occupational Profile and History Detailed Assessment- Review of Records and additional review of physical, cognitive, psychosocial history related to current functional performance    Occupational performance deficits (Please refer to evaluation for details): ADL's;IADL's;Leisure    Body Structure / Function / Physical Skills ADL;Dexterity;Balance;Tone;Pain;UE functional use;Coordination;Flexibility;FMC;Mobility;Improper spinal/pelvic alignment;IADL;ROM    Rehab Potential Good    Clinical Decision Making Several treatment options, min-mod task modification necessary    Comorbidities Affecting Occupational Performance: May have comorbidities impacting occupational performance    Modification or Assistance to  Complete Evaluation  Min-Moderate modification of tasks or assist with assess necessary to complete eval    OT Frequency 2x / week    OT Duration --   x5 weeks + eval (or 10 visits--over 6 weeks prn due to scheduling).   OT Treatment/Interventions Self-care/ADL training;DME and/or AE instruction;Balance training;Therapeutic activities;Therapeutic exercise;Passive range of motion;Functional Mobility Training;Neuromuscular education;Cryotherapy;Ultrasound;Moist Heat;Fluidtherapy;Paraffin;Energy conservation;Manual Therapy;Patient/family education;Electrical Stimulation    Plan large amplitude movements; coordination R hand    Consulted and Agree with Plan of  Care Patient             Patient will benefit from skilled therapeutic intervention in order to improve the following deficits and impairments:   Body Structure / Function / Physical Skills: ADL, Dexterity, Balance, Tone, Pain, UE functional use, Coordination, Flexibility, FMC, Mobility, Improper spinal/pelvic alignment, IADL, ROM       Visit Diagnosis: Other symptoms and signs involving the nervous system  Other lack of coordination  Other symptoms and signs involving the musculoskeletal system  Abnormal posture  Tremor  Unsteadiness on feet  Stiffness of left shoulder, not elsewhere classified  Chronic left shoulder pain    Problem List Patient Active Problem List   Diagnosis Date Noted   Primary renal papillary carcinoma, right ([place]) 02/18/2020   Hepatic steatosis 02/18/2020   Major depression in remission ([place]) 11/11/2019   Central sleep apnea due to Cheyne-Stokes respiration 08/31/2019   Elevated PSA 08/13/2019   Hyperlipidemia associated with type 2 diabetes mellitus ([place]) 07/09/2019   Aortic atherosclerosis ([place]) by CXR on 06/10/2018 06/10/2018   Type 2 diabetes mellitus with stage 3a chronic kidney disease, without long-term current use of insulin ([place]) 01/21/2018   OSA treated with BiPAP 01/03/2017   REM  sleep behavior disorder 12/06/2016   CKD stage 3 due to type 2 diabetes mellitus ([place]) 10/23/2016   Medication management 10/23/2016   Vitamin D deficiency 10/23/2016   Parkinson's disease ([place]) 01/04/2016   Seasonal and perennial allergic rhinitis 06/21/2014   Asthma, mild intermittent, well-controlled 06/21/2014   Pseudomonas aeruginosa colonization 10/08/2012   CAD (coronary artery disease) 11/15/2011   Essential hypertension 11/15/2011   Morbid obesity with BMI of 40.0-44.9, adult ([place]) 11/15/2011    Waverley Krempasky, OT/L 02/24/2021, 3:26 PM  [facility] [address] [place], [place], [address] Phone: [phone]   Fax:  [phone]  Name: JIYAAN STEINHAUSER MRN: [id number] Date of Birth: 01-Nov-1955  Vianne Bulls, OTR/L [facility] [address] [place], [place]  [address] [phone] phone [phone] 02/24/21 3:26 PM

## 2021-03-01 ENCOUNTER — Encounter: Payer: Self-pay | Admitting: Occupational Therapy

## 2021-03-01 ENCOUNTER — Other Ambulatory Visit: Payer: Self-pay

## 2021-03-01 ENCOUNTER — Ambulatory Visit: Payer: Medicare Other | Admitting: Occupational Therapy

## 2021-03-01 DIAGNOSIS — R29898 Other symptoms and signs involving the musculoskeletal system: Secondary | ICD-10-CM | POA: Diagnosis not present

## 2021-03-01 DIAGNOSIS — M25612 Stiffness of left shoulder, not elsewhere classified: Secondary | ICD-10-CM | POA: Diagnosis not present

## 2021-03-01 DIAGNOSIS — G8929 Other chronic pain: Secondary | ICD-10-CM | POA: Diagnosis not present

## 2021-03-01 DIAGNOSIS — R251 Tremor, unspecified: Secondary | ICD-10-CM

## 2021-03-01 DIAGNOSIS — R278 Other lack of coordination: Secondary | ICD-10-CM

## 2021-03-01 DIAGNOSIS — R29818 Other symptoms and signs involving the nervous system: Secondary | ICD-10-CM

## 2021-03-01 DIAGNOSIS — R293 Abnormal posture: Secondary | ICD-10-CM

## 2021-03-01 DIAGNOSIS — R2681 Unsteadiness on feet: Secondary | ICD-10-CM | POA: Diagnosis not present

## 2021-03-01 DIAGNOSIS — M25512 Pain in left shoulder: Secondary | ICD-10-CM | POA: Diagnosis not present

## 2021-03-01 DIAGNOSIS — R2689 Other abnormalities of gait and mobility: Secondary | ICD-10-CM | POA: Diagnosis not present

## 2021-03-01 NOTE — Therapy (Signed)
[facility] [address] [place], [place], [address] Phone: [phone]   Fax:  [phone]  Occupational Therapy Treatment  Patient Details  Name: Todd Rice MRN: [id number] Date of Birth: 28-Feb-1956 Referring Provider (OT): Dr. Margette Fast (will be following up Dr. Leta Baptist)   Encounter Date: 03/01/2021   OT End of Session - 03/01/21 0740     Visit Number 7    Number of Visits 10    Date for OT Re-Evaluation 03/12/21    Authorization Type UHC Medicare, follows Medicare guidelines    Authorization - Number of Visits 7    Progress Note Due on Visit 10    OT Start Time 0720    OT Stop Time 0800    OT Time Calculation (min) 40 min    Activity Tolerance Patient tolerated treatment well    Behavior During Therapy [facility] for tasks assessed/performed             Past Medical History:  Diagnosis Date   Adult BMI 50.0-59.9 kg/sq m    52.77   Anal fissure    Anxiety    Diabetes mellitus without complication (HCC)    borderline   GERD (gastroesophageal reflux disease)    30 years ago, maybe from peptic ulcer   H/O: gout    STABLE   Hepatic steatosis 10/01/11   History of kidney stones    Hyperlipidemia    Hypertension    Kidney tumor    right upper kidney tumor, monitoring   OSA on CPAP    [employer] [facility]   Parkinson disease ([place]) 01/04/2016   REM sleep behavior disorder 12/06/2016   Right ureteral stone    Ureteral calculus, right 06/12/2011   Vitamin D deficiency    Weakness     Past Surgical History:  Procedure Laterality Date   ANAL FISSURE REPAIR  10-12-2000   COLONOSCOPY N/A 07/01/2012   Procedure: COLONOSCOPY;  Surgeon: Lafayette Dragon, MD;  Location: [facility];  Service: Endoscopy;  Laterality: N/A;   CYSTOSCOPY WITH RETROGRADE PYELOGRAM, URETEROSCOPY AND STENT PLACEMENT Bilateral 02/26/2013   Procedure: CYSTOSCOPY WITH RETROGRADE PYELOGRAM, URETEROSCOPY AND STENT PLACEMENT;  Surgeon:  Alexis Frock, MD;  Location: [facility];  Service: Urology;  Laterality: Bilateral;   HOLMIUM LASER APPLICATION Bilateral 40/02/2724   Procedure: HOLMIUM LASER APPLICATION;  Surgeon: Alexis Frock, MD;  Location: [facility];  Service: Urology;  Laterality: Bilateral;   kidney stone removal     06/2011-stent placement   LEFT MEDIAL FEMORAL CONDYLE DEBRIDEMENT & DRILLING/ REMOVAL LOOSE BODY  09-15-2003   NASAL SEPTUM SURGERY  1985   STONE EXTRACTION WITH BASKET  06/12/2011   Procedure: STONE EXTRACTION WITH BASKET;  Surgeon: Claybon Jabs, MD;  Location: [facility];  Service: Urology;  Laterality: Right;    There were no vitals filed for this visit.   Subjective Assessment - 03/01/21 0726     Subjective  doing good.    Pertinent History Parkinson's Disease.   PMH:  CAD, HTN, HDL, obesity, asthma, DM, CKD, vitamin D deficiency, REM sleep disorder, OSA, aortic atherosclerosis, hx of gout, GERD, hx of anxiety    Currently in Pain? No/denies    Pain Onset Today               PWR! Moves (basic 4) in supine and quadruped x 10 each with min cues For incr movement amplitude.  Picking up coins with each finger and thumb with each hand with min  cueing for PWR! Hands prior to incr large amplitude movements.  Ambulating and tossing/catching 2 scarves alternating with min cueing for incr movement amplitude.  Then juggling 2 scarves with min cueing for sequence to facilitate large amplitude hand movements.  Placing grooved pegs in pegboard with each hand with PWR! Hands prior to grasp for incr in-hand manipulation and coordination.  Standing, functional reaching to grasp/release cylinder objects incorporating trunk rotation/wt. Shift and large amplitude reaching/grasp with min cueing.        OT Short Term Goals - 02/01/21 1435       OT SHORT TERM GOAL #1   Title --               OT Long Term Goals - 03/01/21 0811       OT LONG TERM GOAL #1   Title Pt will  verbalize understanding of updated strategies for ADLs to incr ease/safety and decr risk of future complications (including positioning of L shoulder to decr risk of pain and cutting food).--check LTGs 03/12/21    Time 5    Period Weeks    Status New      OT LONG TERM GOAL #2   Title Pt will improve RUE functional reaching/coordination for ADLs/IADLs as shown by improving score on box and blocks test by at least 4.    Baseline 41 blocks    Time 5    Period Weeks    Status New      OT LONG TERM GOAL #3   Title Pt will improve R hand coordination for ADLs as shown by improving time on 9-hole peg test 4 sec.    Baseline 40.09sec    Time 5    Period Weeks    Status New      OT LONG TERM GOAL #4   Title Pt will improve bilateral hand coordination for ADLs as shown by fastening/unfastening 3 buttons in 30sec or less.    Baseline 33sec    Time 5    Period Weeks    Status New      OT LONG TERM GOAL #5   Title Pt will be independent with updates to PD-specific HEP.    Time 5    Period Weeks    Status Achieved                   Plan - 03/01/21 0740     Clinical Impression Statement Pt is progressing with bilateral hand coordination and timing.    OT Occupational Profile and History Detailed Assessment- Review of Records and additional review of physical, cognitive, psychosocial history related to current functional performance    Occupational performance deficits (Please refer to evaluation for details): ADL's;IADL's;Leisure    Body Structure / Function / Physical Skills ADL;Dexterity;Balance;Tone;Pain;UE functional use;Coordination;Flexibility;FMC;Mobility;Improper spinal/pelvic alignment;IADL;ROM    Rehab Potential Good    Clinical Decision Making Several treatment options, min-mod task modification necessary    Comorbidities Affecting Occupational Performance: May have comorbidities impacting occupational performance    Modification or Assistance to Complete Evaluation   Min-Moderate modification of tasks or assist with assess necessary to complete eval    OT Frequency 2x / week    OT Duration --   x5 weeks + eval (or 10 visits--over 6 weeks prn due to scheduling).   OT Treatment/Interventions Self-care/ADL training;DME and/or AE instruction;Balance training;Therapeutic activities;Therapeutic exercise;Passive range of motion;Functional Mobility Training;Neuromuscular education;Cryotherapy;Ultrasound;Moist Heat;Fluidtherapy;Paraffin;Energy conservation;Manual Therapy;Patient/family education;Electrical Stimulation    Plan check goals and anticipate d/c, schedule follow-up  Consulted and Agree with Plan of Care Patient             Patient will benefit from skilled therapeutic intervention in order to improve the following deficits and impairments:   Body Structure / Function / Physical Skills: ADL, Dexterity, Balance, Tone, Pain, UE functional use, Coordination, Flexibility, FMC, Mobility, Improper spinal/pelvic alignment, IADL, ROM       Visit Diagnosis: Other symptoms and signs involving the nervous system  Other lack of coordination  Other symptoms and signs involving the musculoskeletal system  Abnormal posture  Tremor  Unsteadiness on feet  Stiffness of left shoulder, not elsewhere classified  Chronic left shoulder pain    Problem List Patient Active Problem List   Diagnosis Date Noted   Primary renal papillary carcinoma, right ([place]) 02/18/2020   Hepatic steatosis 02/18/2020   Major depression in remission ([place]) 11/11/2019   Central sleep apnea due to Cheyne-Stokes respiration 08/31/2019   Elevated PSA 08/13/2019   Hyperlipidemia associated with type 2 diabetes mellitus ([place]) 07/09/2019   Aortic atherosclerosis ([place]) by CXR on 06/10/2018 06/10/2018   Type 2 diabetes mellitus with stage 3a chronic kidney disease, without long-term current use of insulin ([place]) 01/21/2018   OSA treated with BiPAP 01/03/2017   REM sleep behavior  disorder 12/06/2016   CKD stage 3 due to type 2 diabetes mellitus ([place]) 10/23/2016   Medication management 10/23/2016   Vitamin D deficiency 10/23/2016   Parkinson's disease ([place]) 01/04/2016   Seasonal and perennial allergic rhinitis 06/21/2014   Asthma, mild intermittent, well-controlled 06/21/2014   Pseudomonas aeruginosa colonization 10/08/2012   CAD (coronary artery disease) 11/15/2011   Essential hypertension 11/15/2011   Morbid obesity with BMI of 40.0-44.9, adult ([place]) 11/15/2011    Carrissa Taitano, OT/L 03/01/2021, [facility] [address] [place], [place], [address] Phone: [phone]   Fax:  [phone]  Name: Todd Rice MRN: [id number] Date of Birth: [birth year]  Vianne Bulls, OTR/L [facility] [address] [place], [place]  [address] [phone] phone [phone] 03/01/21 8:11 AM

## 2021-03-03 ENCOUNTER — Ambulatory Visit: Payer: Medicare Other | Admitting: Occupational Therapy

## 2021-03-03 ENCOUNTER — Other Ambulatory Visit: Payer: Self-pay

## 2021-03-03 ENCOUNTER — Encounter: Payer: Self-pay | Admitting: Occupational Therapy

## 2021-03-03 DIAGNOSIS — R29898 Other symptoms and signs involving the musculoskeletal system: Secondary | ICD-10-CM | POA: Diagnosis not present

## 2021-03-03 DIAGNOSIS — R251 Tremor, unspecified: Secondary | ICD-10-CM | POA: Diagnosis not present

## 2021-03-03 DIAGNOSIS — R2689 Other abnormalities of gait and mobility: Secondary | ICD-10-CM | POA: Diagnosis not present

## 2021-03-03 DIAGNOSIS — R278 Other lack of coordination: Secondary | ICD-10-CM | POA: Diagnosis not present

## 2021-03-03 DIAGNOSIS — R29818 Other symptoms and signs involving the nervous system: Secondary | ICD-10-CM

## 2021-03-03 DIAGNOSIS — R2681 Unsteadiness on feet: Secondary | ICD-10-CM

## 2021-03-03 DIAGNOSIS — M25512 Pain in left shoulder: Secondary | ICD-10-CM | POA: Diagnosis not present

## 2021-03-03 DIAGNOSIS — R293 Abnormal posture: Secondary | ICD-10-CM

## 2021-03-03 DIAGNOSIS — G8929 Other chronic pain: Secondary | ICD-10-CM | POA: Diagnosis not present

## 2021-03-03 DIAGNOSIS — M25612 Stiffness of left shoulder, not elsewhere classified: Secondary | ICD-10-CM | POA: Diagnosis not present

## 2021-03-03 NOTE — Therapy (Signed)
[facility] [address] [place], [place], [address] Phone: [phone]   Fax:  [phone]  Occupational Therapy Treatment  Patient Details  Name: Todd Rice MRN: [id number] Date of Birth: 22-Dec-1955 Referring Provider (OT): Dr. Margette Fast (will be following up Dr. Leta Baptist)   Encounter Date: 03/03/2021   OT End of Session - 03/03/21 0735     Visit Number 8    Number of Visits 10    Date for OT Re-Evaluation 03/12/21    Authorization Type UHC Medicare, follows Medicare guidelines    Authorization - Number of Visits 8    Progress Note Due on Visit 10    OT Start Time 0720    OT Stop Time 0800    OT Time Calculation (min) 40 min    Activity Tolerance Patient tolerated treatment well    Behavior During Therapy [facility] for tasks assessed/performed             Past Medical History:  Diagnosis Date   Adult BMI 50.0-59.9 kg/sq m    52.77   Anal fissure    Anxiety    Diabetes mellitus without complication (HCC)    borderline   GERD (gastroesophageal reflux disease)    30 years ago, maybe from peptic ulcer   H/O: gout    STABLE   Hepatic steatosis 10/01/11   History of kidney stones    Hyperlipidemia    Hypertension    Kidney tumor    right upper kidney tumor, monitoring   OSA on CPAP    [employer] [facility]   Parkinson disease ([place]) 01/04/2016   REM sleep behavior disorder 12/06/2016   Right ureteral stone    Ureteral calculus, right 06/12/2011   Vitamin D deficiency    Weakness     Past Surgical History:  Procedure Laterality Date   ANAL FISSURE REPAIR  10-12-2000   COLONOSCOPY N/A 07/01/2012   Procedure: COLONOSCOPY;  Surgeon: Lafayette Dragon, MD;  [facility];  Service: Endoscopy;  Laterality: N/A;   CYSTOSCOPY WITH RETROGRADE PYELOGRAM, URETEROSCOPY AND STENT PLACEMENT Bilateral 02/26/2013   Procedure: CYSTOSCOPY WITH RETROGRADE PYELOGRAM, URETEROSCOPY AND STENT PLACEMENT;  Surgeon:  Alexis Frock, MD;  [facility];  Service: Urology;  Laterality: Bilateral;   HOLMIUM LASER APPLICATION Bilateral 46/95/0722   Procedure: HOLMIUM LASER APPLICATION;  Surgeon: Alexis Frock, MD;  [facility];  Service: Urology;  Laterality: Bilateral;   kidney stone removal     06/2011-stent placement   LEFT MEDIAL FEMORAL CONDYLE DEBRIDEMENT & DRILLING/ REMOVAL LOOSE BODY  09-15-2003   NASAL SEPTUM SURGERY  1985   STONE EXTRACTION WITH BASKET  06/12/2011   Procedure: STONE EXTRACTION WITH BASKET;  Surgeon: Claybon Jabs, MD;  [facility];  Service: Urology;  Laterality: Right;    There were no vitals filed for this visit.   Subjective Assessment - 03/03/21 0735     Subjective  doing good    Pertinent History Parkinson's Disease.   PMH:  CAD, HTN, HDL, obesity, asthma, DM, CKD, vitamin D deficiency, REM sleep disorder, OSA, aortic atherosclerosis, hx of gout, GERD, hx of anxiety    Currently in Pain? No/denies    Pain Onset Today              Standing, PWR! Moves (up, rock, twist) with min cueing for incr movement amplitude, particularly opening R hand and extending R elbow (PWR! Twist at wall for incr proprioceptive feedback for large amplitude opening,  PWR! Up and rock with extra hand "flicks" for focus on open hand)  PWR! Hands basic 4 with min cueing (verbal and tactile) for incr movement amplitude with R hand.  With both hands:  with palm on table, individual finger lifts with abduction/adduction, then isolated/individual finger lifts for incr MP ext. With min v.c. for incr movement amplitude.  Isolated IP flexion/ext for decr rigidity of hands with min v.c.  Bilateral hand coordination and timing:  sliding cards across table for PWR! Hands simultaneously with cueing for large amplitude.  Checked goals and discussed progress and d/c recommendations--see goal section below.          OT Short Term Goals - 02/01/21 1435       OT SHORT  TERM GOAL #1   Title --               OT Long Term Goals - 03/03/21 0748       OT LONG TERM GOAL #1   Title Pt will verbalize understanding of updated strategies for ADLs to incr ease/safety and decr risk of future complications (including positioning of L shoulder to decr risk of pain and cutting food).--check LTGs 03/12/21    Time 5    Period Weeks    Status Achieved      OT LONG TERM GOAL #2   Title Pt will improve RUE functional reaching/coordination for ADLs/IADLs as shown by improving score on box and blocks test by at least 4.    Baseline 41 blocks    Time 5    Period Weeks    Status Achieved   03/03/21:  L-48 blocks, R-45 blocks     OT LONG TERM GOAL #3   Title Pt will improve R hand coordination for ADLs as shown by improving time on 9-hole peg test 4 sec.    Baseline 40.09sec    Time 5    Period Weeks    Status Achieved   03/03/21:  L-32.13, R-33.88sec     OT LONG TERM GOAL #4   Title Pt will improve bilateral hand coordination for ADLs as shown by fastening/unfastening 3 buttons in 30sec or less.    Baseline 33sec    Time 5    Period Weeks    Status Achieved   03/03/21:  32.79, 25.10sec     OT LONG TERM GOAL #5   Title Pt will be independent with updates to PD-specific HEP.    Time 5    Period Weeks    Status Achieved                   Plan - 03/03/21 0736     Clinical Impression Statement Pt has made great progress with all goals met.    OT Occupational Profile and History Detailed Assessment- Review of Records and additional review of physical, cognitive, psychosocial history related to current functional performance    Occupational performance deficits (Please refer to evaluation for details): ADL's;IADL's;Leisure    Body Structure / Function / Physical Skills ADL;Dexterity;Balance;Tone;Pain;UE functional use;Coordination;Flexibility;FMC;Mobility;Improper spinal/pelvic alignment;IADL;ROM    Rehab Potential Good    Clinical Decision Making  Several treatment options, min-mod task modification necessary    Comorbidities Affecting Occupational Performance: May have comorbidities impacting occupational performance    Modification or Assistance to Complete Evaluation  Min-Moderate modification of tasks or assist with assess necessary to complete eval    OT Frequency 2x / week    OT Duration --   x5 weeks + eval (or  10 visits--over 6 weeks prn due to scheduling).   OT Treatment/Interventions Self-care/ADL training;DME and/or AE instruction;Balance training;Therapeutic activities;Therapeutic exercise;Passive range of motion;Functional Mobility Training;Neuromuscular education;Cryotherapy;Ultrasound;Moist Heat;Fluidtherapy;Paraffin;Energy conservation;Manual Therapy;Patient/family education;Electrical Stimulation    Plan d/c OT; schedule OT eval in approx 6 months    Consulted and Agree with Plan of Care Patient             Patient will benefit from skilled therapeutic intervention in order to improve the following deficits and impairments:   Body Structure / Function / Physical Skills: ADL, Dexterity, Balance, Tone, Pain, UE functional use, Coordination, Flexibility, FMC, Mobility, Improper spinal/pelvic alignment, IADL, ROM       Visit Diagnosis: Other symptoms and signs involving the nervous system  Other symptoms and signs involving the musculoskeletal system  Other lack of coordination  Abnormal posture  Tremor  Unsteadiness on feet  Stiffness of left shoulder, not elsewhere classified  Chronic left shoulder pain  Other abnormalities of gait and mobility    Problem List Patient Active Problem List   Diagnosis Date Noted   Primary renal papillary carcinoma, right ([place]) 02/18/2020   Hepatic steatosis 02/18/2020   Major depression in remission ([place]) 11/11/2019   Central sleep apnea due to Cheyne-Stokes respiration 08/31/2019   Elevated PSA 08/13/2019   Hyperlipidemia associated with type 2 diabetes mellitus  ([place]) 07/09/2019   Aortic atherosclerosis ([place]) by CXR on 06/10/2018 06/10/2018   Type 2 diabetes mellitus with stage 3a chronic kidney disease, without long-term current use of insulin ([place]) 01/21/2018   OSA treated with BiPAP 01/03/2017   REM sleep behavior disorder 12/06/2016   CKD stage 3 due to type 2 diabetes mellitus ([place]) 10/23/2016   Medication management 10/23/2016   Vitamin D deficiency 10/23/2016   Parkinson's disease ([place]) 01/04/2016   Seasonal and perennial allergic rhinitis 06/21/2014   Asthma, mild intermittent, well-controlled 06/21/2014   Pseudomonas aeruginosa colonization 10/08/2012   CAD (coronary artery disease) 11/15/2011   Essential hypertension 11/15/2011   Morbid obesity with BMI of 40.0-44.9, adult ([place]) 11/15/2011    OCCUPATIONAL THERAPY DISCHARGE SUMMARY  Visits from Start of Care: 8  Current functional level related to goals / functional outcomes: See above   Remaining deficits: Bradykinesia, rigidity, decr coordination, abnormal posture, decr balance/functional mobility, Tremors   Education / Equipment: Pt was instructed in the following:  PD-specific HEP, adaptive strategies for ADLs/IADLs.  Pt verbalized understanding of all education provided.   Plan: Patient agrees to discharge.  Patient goals were  met. Patient is being discharged due to being pleased with the current functional level.  Pt would benefit from occupational therapy evaluation in approx 6 months to assess for need for further therapy/functional changes due to progressive nature of diagnosis.     [facility], OT/L 03/03/2021, 9:24 AM  [facility] [address] [place], [place], [address] Phone: [phone]   Fax:  [phone]  Name: Todd Rice MRN: [id number] Date of Birth: [birth year]  Vianne Bulls, OTR/L [facility] [address] [place], [place]   [address] [phone] phone [phone] 03/03/21 9:24 AM

## 2021-03-07 ENCOUNTER — Other Ambulatory Visit (HOSPITAL_BASED_OUTPATIENT_CLINIC_OR_DEPARTMENT_OTHER): Payer: Self-pay

## 2021-03-15 ENCOUNTER — Telehealth: Payer: Self-pay | Admitting: Internal Medicine

## 2021-03-15 NOTE — Chronic Care Management (AMB) (Signed)
  Chronic Care Management   Note  03/15/2021 Name: [name] MRN: [id number] DOB: March 06, 1956  Todd Rice is a 65 y.o. year old male who is a primary care patient of Unk Pinto, MD. I reached out to Todd Rice by phone today in response to a referral sent by [name]'s PCP, Unk Pinto, MD.   [name] was given information about Chronic Care Management services today including:  CCM service includes personalized support from designated clinical staff supervised by his physician, including individualized plan of care and coordination with other care providers 24/7 contact phone numbers for assistance for urgent and routine care needs. Service will only be billed when office clinical staff spend 20 minutes or more in a month to coordinate care. Only one practitioner may furnish and bill the service in a calendar month. The patient may stop CCM services at any time (effective at the end of the month) by phone call to the office staff.   Patient agreed to services and verbal consent obtained.   Follow up plan:   [place] Secretary/administrator

## 2021-03-16 ENCOUNTER — Telehealth: Payer: Self-pay | Admitting: Neurology

## 2021-03-16 NOTE — Telephone Encounter (Signed)
Pt called asking if an order for a new BIPAP machine was sent in. Pt requesting a call back.

## 2021-03-16 NOTE — Telephone Encounter (Signed)
Called the patient back. I have already forwarded the order to the company back in may.  The patient is not eligible until 03/20/2021 for a new machine.  The company said that they would place the order on file until he was eligible.  Advised the patient he should contact company in regards to the order and I will also send a email to them as well.  Advised the patient to ensure he schedules a initial ASV follow-up 61-90 days from the date he starts his new machine. Pt verbalized understanding.

## 2021-03-23 ENCOUNTER — Encounter: Payer: Self-pay | Admitting: Adult Health

## 2021-03-23 DIAGNOSIS — M109 Gout, unspecified: Secondary | ICD-10-CM | POA: Insufficient documentation

## 2021-03-23 NOTE — Progress Notes (Signed)
CPE  Assessment and Plan:  Encounter for Annual Physical Exam with abnormal findings Due annually  Health Maintenance reviewed Healthy lifestyle reviewed and goals set Eye exam report requested Get shingrix at pharmacy  Atherosclerosis of aorta [facility]) - per CXR 06/2018 Control blood pressure, cholesterol, glucose, increase exercise.   Coronary artery disease involving native coronary artery of native heart without angina pectoris Control blood pressure, cholesterol, glucose, increase exercise.  Go to the ER if any chest pain, shortness of breath, nausea, dizziness, severe HA, changes vision/speech  Essential hypertension - continue medications, DASH diet, exercise and monitor at home. Call if greater than 130/80.   Controlled type 2 diabetes mellitus with diabetic dermatitis, without long-term current use of insulin ([place]) Discussed general issues about diabetes pathophysiology and management., Educational material distributed., Suggested low cholesterol diet., Encouraged aerobic exercise., Discussed foot care., Reminded to get yearly retinal exam.  Off of januvia, uses glipizide PRN with elevations only Discussed possible weight and CVD benefit with GLP1-ra; he would like trial; trulicty 9.37 mg samples x 2 weeks given; follow up if tolerating well and will give prescription.  Check A1C q80m.   Hyperlipidemia, unspecified hyperlipidemia type -taking rosuvastatin as tolerated - some GI discomfort check lipids, decrease fatty foods, increase activity.   Class 3 obesity due to excess calories with serious comorbidity and body mass index (BMI) of 40.0 to 44.9 in adult [facility])  - long discussion about weight loss, diet, and exercise - trying trulicity -     Lipid panel -     Hemoglobin A1c  Parkinson disease ([place]) Continue neuro Dr. [name] follow up and meds PT routinely; doing exercises at home; Dr. [name] orders as needed  Medication management Each visit   Vitamin D  deficiency Continue supplement  -     VITAMIN D 25 Hydroxy (Vit-D Deficiency, Fractures)  Seasonal and perennial allergic rhinitis - Allegra OTC, increase H20, allergy hygiene explained.  Asthma, mild intermittent, well-controlled Weight loss, PRN albuterol, avoid triggers  Right renal cell carcinoma (HCC) Dr. Tresa Moore following, active monitoring  Pseudomonas aeruginosa colonization Monitor, urology following Recently est with ID Dr. Tommy Medal for IV treatment if symptomatic   OSA treated with BiPAP Dr. Brett Fairy following- continue BiPAP, helping with daytime fatigue, weight loss still advised.   REM sleep behavior disorder continue neuro follow up  Major depression in remission ([place]) In remission off of medication; monitor   Constipation Increase sennakot to daily, increase fiber in diet, continue exercise Increase miralax if firm stools Laboratory tests per orders. Please go to the hospital if you have severe abdominal pain, vomiting, fever, CP, SOB.   Need for pneumonia vaccination - 23 valent pneumococcal vaccine administered without complication today    Orders Placed This Encounter  Procedures   Pneumococcal polysaccharide vaccine 23-valent greater than or equal to 2yo subcutaneous/IM   CBC with Differential/Platelet   COMPLETE METABOLIC PANEL WITH GFR   Magnesium   Lipid panel   TSH   Hemoglobin A1c   VITAMIN D 25 Hydroxy (Vit-D Deficiency, Fractures)   PSA   Microalbumin / creatinine urine ratio   Urinalysis, Routine w reflex microscopic   Uric acid   EKG 12-Lead   HM DIABETES FOOT EXAM     Discussed med's effects and SE's. Screening labs and tests as requested with regular follow-up as recommended.  Future Appointments  Date Time Provider [place]  03/30/2021  1:30 PM Newton Pigg, [facility] None  06/07/2021  8:30 AM Ward Givens, NP GNA-GNA  None  06/23/2021  3:30 PM [name], NP GAAM-GAAIM None  08/02/2021  8:30 AM [place],  Earlean Polka, MD GNA-GNA None  09/01/2021  7:15 AM Delton [place], OT/L [facility]  09/20/2021  4:00 PM [name], NP GAAM-GAA[facility] None  03/28/2022  3:00 PM [name], NP GAAM-GAA[facility] None     Plan:   During the course of the visit the patient was educated and counseled about appropriate screening and preventive services including:   Pneumococcal vaccine  Prevnar 13 Influenza vaccine Td vaccine Screening electrocardiogram Bone densitometry screening Colorectal cancer screening Diabetes screening Glaucoma screening Nutrition counseling  Advanced directives: requested    HPI 65 y.o. male patient presents for CPE. He has CAD (coronary artery disease); Essential hypertension; Morbid obesity with BMI of 40.0-44.9, adult ([place]); Pseudomonas aeruginosa colonization; Seasonal and perennial allergic rhinitis; Asthma, mild intermittent, well-controlled; Parkinson's disease ([place]); CKD stage 3 due to type 2 diabetes mellitus ([place]); Medication management; Vitamin D deficiency; REM sleep behavior disorder; OSA treated with BiPAP; Type 2 diabetes mellitus with stage 3a chronic kidney disease, without long-term current use of insulin ([place]); Aortic atherosclerosis ([place]) by CXR on 06/10/2018; Hyperlipidemia associated with type 2 diabetes mellitus ([place]); Central sleep apnea due to Cheyne-Stokes respiration; Major depression in remission ([place]); Primary renal papillary carcinoma, right ([place]); Hepatic steatosis; Gout; and Constipation on their problem list.  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 Accompanied by his wife. Denies new concerns today.   He reports in the process of getting top bridge put in.   He has depression in remission, formerly on wellbutrin but reports doing fairly off of this med. He has xanax that he uses occasionally, typically 2-3 tabs per month. Rare use of ambien for sleep (prescribed by Dr. Brett Fairy).   He is on BiPAP for OSA per Dr. Brett Fairy, endorses 100% compliance and  restorative sleep.   He had MRI with MRCP with Dr. Ardis Hughs for pancreatic cyst in May 2020, and will discuss having repeat in 2 years, had follow up MRI 09/12/2020 that was unchanged, no further follow up was recommended.   Also has R renal papillary carcinoma, Follows annually with Dr. Tresa Moore, has been stable for several years.   Follows with neurology Dr. [name] for Parkinson'. He reports that he has been taking the mirapex and sinemet and feels well controlled. Does continue to have significant L hand tremor. Did PT summer 2021, discussing with PT therapist restarting again soon as has been helpful.    He is on miralax and senokot once a day for constipation.  Has mild asthma, has albuterol uses q2-3 days PRN if needed when outdoors and ragweed season.    He has hx of chronic pseudomonas bladder colonization, recently est with Dr. Tommy Medal so if recurrent UTI sx planning on IV abx.   BMI is Body mass index is 42.1 kg/m., he has been working on diet and exercise, has joined the gym, going 3-4 days/week. Admits some good/bad with diet, harder with teeth missing right now. Has been pushing water, 4-7 bottles.  Wt Readings from Last 3 Encounters:  03/24/21 262 lb 12.8 oz (119.2 kg)  01/31/21 260 lb (117.9 kg)  10/05/20 266 lb (120.7 kg)   Aortic atherosclerosis per CXR 2020. Normal stress test 2013 by Dr. Percival Spanish due to CAD per CT.  His blood pressure has been controlled at home, today their BP is BP: 118/74  He does workout. He denies chest pain, shortness of breath, dizziness.  He is on  cholesterol medication (rosuvastatin 40 mg taking every 2-3 days due to GI discomfort) and denies myalgias. His cholesterol is not at goal of 70 or less. He reports has cut way back on french fries and fried foods. The cholesterol last visit was:   Lab Results  Component Value Date   CHOL 202 (H) 09/27/2020   HDL 32 (L) 09/27/2020   LDLCALC  09/27/2020     Comment:     . LDL cholesterol not calculated.  Triglyceride levels greater than 400 mg/dL invalidate calculated LDL results. . Reference range: <100 . Desirable range <100 mg/dL for primary prevention;   <70 mg/dL for patients with CHD or diabetic patients  with > or = 2 CHD risk factors. Marland Kitchen LDL-C is now calculated using the Martin-Hopkins  calculation, which is a validated novel method providing  better accuracy than the Friedewald equation in the  estimation of LDL-C.  Cresenciano Genre et al. Annamaria Helling. 1610;960(45): 2061-2068  (http://education.[employer].com/faq/FAQ164)    TRIG 485 (H) 09/27/2020   CHOLHDL 6.3 (H) 09/27/2020   He has been working on diet and exercise for diabetes CKD, he is on ARB Hyperlipemia on crestor Aortic atherosclerosis he is on bASA he is on metformin 4 a day  Off off januvia  Only takes glipizide if his sugar is 150+ in AM takes 1/2, 200+ takes whole if 200+, hasn't taken in quite some time His sugars have been running 91-150s, average 130s Has hypoglycemic awareness Eye Exam 2021, Dr. Sherral Hammers denies foot ulcerations, hyperglycemia, hypoglycemia , increased appetite, nausea, paresthesia of the feet, polydipsia, polyuria, visual disturbances, vomiting and weight loss.  Last A1C in the office was:  Lab Results  Component Value Date   HGBA1C 7.1 (H) 09/27/2020   He has CKD III associated with T2DM monitored at this office, was slightly lower last check:  Lab Results  Component Value Date   GFRNONAA 58 (L) 09/27/2020   Patient is on Vitamin D supplement.   Lab Results  Component Value Date   VD25OH 84 09/27/2020   Hx of PSA elevation, follows with Dr. Tresa Moore. Denies LUTS.  Lab Results  Component Value Date   PSA 2.08 02/18/2020      Current Medications:   Current Outpatient Medications (Endocrine & Metabolic):    Dulaglutide (TRULICITY) 4.09 WJ/1.9JY SOPN, Inject 0.75 mg into the skin once a week.   glipiZIDE (GLUCOTROL) 5 MG tablet, Take 1 tablet (5 mg total) by mouth 2 (two) times daily  before a meal.   metFORMIN (GLUCOPHAGE-XR) 500 MG 24 hr tablet, TAKE 2 TABLETS BY MOUTH 2 TIMES DAILY  Current Outpatient Medications (Cardiovascular):    ezetimibe (ZETIA) 10 MG tablet, Take 1 tablet (10 mg total) by mouth daily.   valsartan-hydrochlorothiazide (DIOVAN-HCT) 320-25 MG tablet, TAKE 1 TABLET BY MOUTH ONCE A DAY FOR BLOOD PRESSURE   rosuvastatin (CRESTOR) 40 MG tablet, Take 1 tablet (40 mg total) by mouth daily. (Patient not taking: Reported on 03/24/2021)  Current Outpatient Medications (Respiratory):    albuterol (VENTOLIN HFA) 108 (90 Base) MCG/ACT inhaler, INHALE 2 PUFFS EVERY 4 HOURS (15 MINUTES APART) FOR ASTHMA RESCUE   mometasone (NASONEX) 50 MCG/ACT nasal spray, PLACE 2 SPRAYS INTO THE NOSE DAILY.  Current Outpatient Medications (Analgesics):    allopurinol (ZYLOPRIM) 300 MG tablet, TAKE 1 TABLET BY MOUTH ONCE A DAY TO TO PREVENT GOUT   aspirin EC 81 MG tablet, Take 81 mg by mouth at bedtime.   Current Outpatient Medications (Other):    carbidopa-levodopa (SINEMET IR)  25-250 MG tablet, TAKE 1 TABLET BY MOUTH 3 TIMES DAILY   pramipexole (MIRAPEX) 1 MG tablet, Take 1 tablet (1 mg total) by mouth 3 (three) times daily.   vitamin C (ASCORBIC ACID) 500 MG tablet, Take 1 tablet (500 mg total) by mouth 3 (three) times daily.   VITAMIN D PO, Take 5,000 Units by mouth 2 (two) times daily.   zolpidem (AMBIEN) 5 MG tablet, Take 1 tablet (5 mg total) by mouth at bedtime as needed for sleep.  Health Maintenance:  Immunization History  Administered Date(s) Administered   Influenza Inj Mdck Quad With Preservative 02/22/2017, 01/21/2018, 02/06/2019   Influenza, High Dose Seasonal PF 02/18/2020, 02/10/2021   Influenza-Unspecified 02/03/2013, 02/05/2014   PFIZER Comirnaty(Gray Top)Covid-19 Tri-Sucrose Vaccine 08/17/2020   PFIZER(Purple Top)SARS-COV-2 Vaccination 06/14/2019, 07/09/2019, 03/06/2020   Pfizer Covid-19 Vaccine Bivalent Booster 85yrs & up 01/27/2021   Pneumococcal  Conjugate-13 02/18/2020   Pneumococcal Polysaccharide-23 03/24/2021   Pneumococcal-Unspecified 11/05/2012   Tdap 03/26/2015     Tetanus: 2016 Pneumovax: 2014, TODAY Prevnar 13: 2021 Flu vaccine: 2022 Shingrix: insurance covers, can get at pharmacy  Covid 19: 2/2, 2021, pfizer + 3 boosters  Colonoscopy: 2014, diverticuloses, Dr. Olevia Perches, 10 year recall Sleep study 2017   Last eye exam: 08/2020, Dr. Sherral Hammers, will request report, monitoring cataract Last dental exam: Dr. Geroge Baseman, had 2022, getting a implants  Last derm exam: remote Dr. Ronnald Ramp  [name], [name] Keene Breath., [name], [name], MD (Inactive) as Consulting Physician (Gastroenterology) Newton Pigg, [facility] as Pharmacist (Pharmacist)   Medical History:  Past Medical History:  Diagnosis Date   Adult BMI 50.0-59.9 kg/sq m    52.77   Anal fissure    Anxiety    Diabetes mellitus without complication ([place])    borderline   Elevated PSA 08/13/2019   GERD (gastroesophageal reflux disease)    30 years ago, maybe from peptic ulcer   H/O: gout    STABLE   Hepatic steatosis 10/01/11   History of kidney stones    Hyperlipidemia    Hypertension    Kidney tumor    right upper kidney tumor, monitoring   OSA on CPAP    [employer] [facility]   Parkinson disease ([place]) 01/04/2016   REM sleep behavior disorder 12/06/2016   Right ureteral stone    Ureteral calculus, right 06/12/2011   Vitamin D deficiency    Weakness    Allergies Allergies  Allergen Reactions   Ciprofloxacin Hives   Ceftriaxone Hives    SURGICAL HISTORY He  has a past surgical history that includes Anal fissure repair (10-12-2000); Nasal septum surgery (1985); LEFT MEDIAL FEMORAL CONDYLE DEBRIDEMENT & DRILLING/ REMOVAL LOOSE BODY (09-15-2003); Stone extraction with basket (06/12/2011); kidney stone removal; Colonoscopy (N/A, 07/01/2012);  Cystoscopy with retrograde pyelogram, ureteroscopy and stent placement (Bilateral, 02/26/2013); and Holmium laser application (Bilateral, 02/26/2013). FAMILY HISTORY His family history includes Cancer in his father; Heart disease in his brother, maternal aunt, mother, and paternal grandmother; Hyperlipidemia in his brother, maternal aunt, mother, and paternal grandmother; Hypertension in his maternal aunt and mother; Lung cancer in his father; Melanoma in his brother; Stroke in his maternal aunt. SOCIAL HISTORY He  reports that he has quit smoking. He has never used smokeless tobacco. He reports that he does not drink alcohol and does not use drugs.   Review of Systems:  Review of Systems  Constitutional:  Negative for malaise/fatigue  and weight loss.  HENT:  Negative for hearing loss and tinnitus.   Eyes:  Negative for blurred vision and double vision.  Respiratory:  Negative for cough, shortness of breath and wheezing.   Cardiovascular:  Negative for chest pain, palpitations, orthopnea, claudication and leg swelling.  Gastrointestinal:  Positive for constipation. Negative for abdominal pain, blood in stool, diarrhea, heartburn, melena, nausea and vomiting.  Genitourinary: Negative.   Musculoskeletal:  Negative for joint pain and myalgias.  Skin:  Negative for rash.  Neurological:  Positive for tremors (L hand). Negative for dizziness, tingling, sensory change, weakness and headaches.  Endo/Heme/Allergies:  Negative for polydipsia.  Psychiatric/Behavioral:  Negative for depression and memory loss. The patient has insomnia (occasionally ). The patient is not nervous/anxious ([place]).   All other systems reviewed and are negative.  Physical Exam: Estimated body mass index is 42.1 kg/m as calculated from the following:   Height as of this encounter: 5' 6.25" (1.683 m).   Weight as of this encounter: 262 lb 12.8 oz (119.2 kg). BP 118/74   Pulse 75   Temp (!) 97.5 F (36.4 C)   Ht 5' 6.25"  (1.683 m)   Wt 262 lb 12.8 oz (119.2 kg)   SpO2 99%   BMI 42.10 kg/m   General Appearance: Well nourished, well dressed morbidly obese male in no apparent distress.  Eyes: PERRLA, EOMs, conjunctiva no swelling or erythema ENT/Mouth: Ear canals clear bilaterally with no erythema, swelling, discharge.  TMs normal bilaterally with no erythema, bulging, or retractions.  Oropharynx clear and moist with no exudate, swelling, or erythema.  Dentition normal.   Neck: Supple, thyroid normal. No bruits, JVD, cervical adenopathy Respiratory: Respiratory effort normal, BS equal bilaterally without rales, rhonchi, wheezing or stridor.  Cardio: RRR without murmurs, rubs or gallops. Brisk peripheral pulses without edema.  Chest: symmetric, with normal excursions Abdomen: Soft, morbidly obese limiting exam, nontender, no guarding, rebound, hernias, masses, or organomegaly. Genitourinary: deferred today  Musculoskeletal: Full ROM all peripheral extremities,5/5 strength, and normal gait.  Skin: Warm, dry without rashes, lesions, ecchymosis. Neuro: A&Ox3, Cranial nerves intact, slow gait.  Overshoot of the right hand with finger to nose.  Tremor of the left hand with finger opposition>tremor of the right hand with finger opposition.  Sensation intact.  Psych: Flat affect, Insight and Judgment appropriate.   EKG: NSR  Over 40 minutes of exam, counseling, chart review and critical decision making was performed  Izora Ribas 5:03 PM [facility]

## 2021-03-24 ENCOUNTER — Encounter: Payer: Self-pay | Admitting: Adult Health

## 2021-03-24 ENCOUNTER — Ambulatory Visit (INDEPENDENT_AMBULATORY_CARE_PROVIDER_SITE_OTHER): Payer: Medicare Other | Admitting: Adult Health

## 2021-03-24 ENCOUNTER — Other Ambulatory Visit: Payer: Self-pay

## 2021-03-24 VITALS — BP 118/74 | HR 75 | Temp 97.5°F | Ht 66.25 in | Wt 262.8 lb

## 2021-03-24 DIAGNOSIS — Z136 Encounter for screening for cardiovascular disorders: Secondary | ICD-10-CM | POA: Diagnosis not present

## 2021-03-24 DIAGNOSIS — Z23 Encounter for immunization: Secondary | ICD-10-CM | POA: Diagnosis not present

## 2021-03-24 DIAGNOSIS — N1831 Chronic kidney disease, stage 3a: Secondary | ICD-10-CM

## 2021-03-24 DIAGNOSIS — K760 Fatty (change of) liver, not elsewhere classified: Secondary | ICD-10-CM

## 2021-03-24 DIAGNOSIS — K5900 Constipation, unspecified: Secondary | ICD-10-CM | POA: Insufficient documentation

## 2021-03-24 DIAGNOSIS — Z0000 Encounter for general adult medical examination without abnormal findings: Secondary | ICD-10-CM | POA: Diagnosis not present

## 2021-03-24 DIAGNOSIS — Z79899 Other long term (current) drug therapy: Secondary | ICD-10-CM

## 2021-03-24 DIAGNOSIS — J4520 Mild intermittent asthma, uncomplicated: Secondary | ICD-10-CM

## 2021-03-24 DIAGNOSIS — E1169 Type 2 diabetes mellitus with other specified complication: Secondary | ICD-10-CM | POA: Diagnosis not present

## 2021-03-24 DIAGNOSIS — E1122 Type 2 diabetes mellitus with diabetic chronic kidney disease: Secondary | ICD-10-CM | POA: Diagnosis not present

## 2021-03-24 DIAGNOSIS — E559 Vitamin D deficiency, unspecified: Secondary | ICD-10-CM

## 2021-03-24 DIAGNOSIS — I10 Essential (primary) hypertension: Secondary | ICD-10-CM

## 2021-03-24 DIAGNOSIS — E785 Hyperlipidemia, unspecified: Secondary | ICD-10-CM

## 2021-03-24 DIAGNOSIS — F325 Major depressive disorder, single episode, in full remission: Secondary | ICD-10-CM

## 2021-03-24 DIAGNOSIS — I2510 Atherosclerotic heart disease of native coronary artery without angina pectoris: Secondary | ICD-10-CM

## 2021-03-24 DIAGNOSIS — G4733 Obstructive sleep apnea (adult) (pediatric): Secondary | ICD-10-CM

## 2021-03-24 DIAGNOSIS — Z125 Encounter for screening for malignant neoplasm of prostate: Secondary | ICD-10-CM

## 2021-03-24 DIAGNOSIS — Z1329 Encounter for screening for other suspected endocrine disorder: Secondary | ICD-10-CM

## 2021-03-24 DIAGNOSIS — G20 Parkinson's disease: Secondary | ICD-10-CM

## 2021-03-24 DIAGNOSIS — C641 Malignant neoplasm of right kidney, except renal pelvis: Secondary | ICD-10-CM

## 2021-03-24 DIAGNOSIS — Z0001 Encounter for general adult medical examination with abnormal findings: Secondary | ICD-10-CM

## 2021-03-24 DIAGNOSIS — I700 Atherosclerosis of aorta: Secondary | ICD-10-CM

## 2021-03-24 DIAGNOSIS — M1A9XX0 Chronic gout, unspecified, without tophus (tophi): Secondary | ICD-10-CM

## 2021-03-24 DIAGNOSIS — N183 Chronic kidney disease, stage 3 unspecified: Secondary | ICD-10-CM | POA: Diagnosis not present

## 2021-03-24 MED ORDER — TRULICITY 0.75 MG/0.5ML ~~LOC~~ SOAJ: 0.7500 mg | SUBCUTANEOUS | 0 refills | Status: DC

## 2021-03-24 NOTE — Patient Instructions (Addendum)
[name] , Thank you for taking time to come for your Annual Wellness Visit. I appreciate your ongoing commitment to your health goals. Please review the following plan we discussed and let me know if I can assist you in the future.   These are the goals we discussed:  Goals      Weight (lb) < 245 lb (111.1 kg)        This is a list of the screening recommended for you and due dates:  Health Maintenance  Topic Date Due   Zoster (Shingles) Vaccine (1 of 2) Never done   Eye exam for diabetics  09/17/2020   Pneumonia Vaccine (2 - PPSV23 if available, else PCV20) 02/17/2021   Hemoglobin A1C  03/30/2021   Complete foot exam   03/24/2022   Colon Cancer Screening  07/01/2022   Tetanus Vaccine  03/25/2025   Flu Shot  Completed   COVID-19 Vaccine  Completed   Hepatitis C Screening: USPSTF Recommendation to screen - Ages 18-79 yo.  Completed   HPV Vaccine  Aged Out      Dulaglutide Injection What is this medication? DULAGLUTIDE (DOO la GLOO tide) treats type 2 diabetes. It works by increasing insulin levels in your body, which decreases your blood sugar (glucose). It also reduces the amount of sugar released into your blood and slows down your digestion. It can also be used to lower the risk of heart attack and stroke in people with type 2 diabetes. Changes to diet and exercise are often combined with this medication. This medicine may be used for other purposes; ask your health care provider or pharmacist if you have questions. COMMON BRAND NAME(S): Trulicity What should I tell my care team before I take this medication? They need to know if you have any of these conditions: Endocrine tumors (MEN 2) or if someone in your family had these tumors Eye disease, vision problems History of pancreatitis Kidney disease Liver disease Stomach or intestine problems Thyroid cancer or if someone in your family had thyroid cancer An unusual or allergic reaction to dulaglutide, other  medications, foods, dyes, or preservatives Pregnant or trying to get pregnant Breast-feeding How should I use this medication? This medication is injected under the skin. You will be taught how to prepare and give it. Take it as directed on the prescription label on the same day of each week. Do NOT prime the pen. Keep taking it unless your care team tells you to stop. If you use this medication with insulin, you should inject this medication and the insulin separately. Do not mix them together. Do not give the injections right next to each other. Change (rotate) injection sites with each injection. This medication comes with INSTRUCTIONS FOR USE. Ask your pharmacist for directions on how to use this medication. Read the information carefully. Talk to your pharmacist or care team if you have questions. It is important that you put your used needles and syringes in a special sharps container. Do not put them in a trash can. If you do not have a sharps container, call your pharmacist or care team to get one. A special MedGuide will be given to you by the pharmacist with each prescription and refill. Be sure to read this information carefully each time. Talk to your care team about the use of this medication in children. Special care may be needed. Overdosage: If you think you have taken too much of this medicine contact a poison control center or emergency room  at once. NOTE: This medicine is only for you. Do not share this medicine with others. What if I miss a dose? If you miss a dose, take it as soon as you can unless it is more than 3 days late. If it is more than 3 days late, skip the missed dose. Take the next dose at the normal time. What may interact with this medication? Other medications for diabetes Many medications may cause changes in blood sugar, these include: Alcohol containing beverages Antiviral medications for HIV or AIDS Aspirin and aspirin-like medications Certain medications  for blood pressure, heart disease, irregular heart beat Chromium Diuretics Male hormones, such as estrogens or progestins, birth control pills Fenofibrate Gemfibrozil Isoniazid Lanreotide Male hormones or anabolic steroids MAOIs like Carbex, Eldepryl, Marplan, Nardil, and Parnate Medications for allergies, asthma, cold, or cough Medications for depression, anxiety, or psychotic disturbances Medications for weight loss Niacin Nicotine NSAIDs, medications for pain and inflammation, like ibuprofen or naproxen Octreotide Pasireotide Pentamidine Phenytoin Probenecid Quinolone antibiotics such as ciprofloxacin, levofloxacin, ofloxacin Some herbal dietary supplements Steroid medications such as prednisone or cortisone Sulfamethoxazole; trimethoprim Thyroid hormones Some medications can hide the warning symptoms of low blood sugar (hypoglycemia). You may need to monitor your blood sugar more closely if you are taking one of these medications. These include: Beta-blockers, often used for high blood pressure or heart problems (examples include atenolol, metoprolol, propranolol) Clonidine Guanethidine Reserpine This list may not describe all possible interactions. Give your health care provider a list of all the medicines, herbs, non-prescription drugs, or dietary supplements you use. Also tell them if you smoke, drink alcohol, or use illegal drugs. Some items may interact with your medicine. What should I watch for while using this medication? Visit your care team for regular checks on your progress. Check with your care team if you have severe diarrhea, nausea, and vomiting, or if you sweat a lot. The loss of too much body fluid may make it dangerous for you to take this medication. A test called the HbA1C (A1C) will be monitored. This is a simple blood test. It measures your blood sugar control over the last 2 to 3 months. You will receive this test every 3 to 6 months. Learn how to  check your blood sugar. Learn the symptoms of low and high blood sugar and how to manage them. Always carry a quick-source of sugar with you in case you have symptoms of low blood sugar. Examples include hard sugar candy or glucose tablets. Make sure others know that you can choke if you eat or drink when you develop serious symptoms of low blood sugar, such as seizures or unconsciousness. Get medical help at once. Tell your care team if you have high blood sugar. You might need to change the dose of your medication. If you are sick or exercising more than usual, you may need to change the dose of your medication. Do not skip meals. Ask your care team if you should avoid alcohol. Many nonprescription cough and cold products contain sugar or alcohol. These can affect blood sugar. Pens should never be shared. Even if the needle is changed, sharing may result in passing of viruses like hepatitis or HIV. Wear a medical ID bracelet or chain. Carry a card that describes your condition. List the medications and doses you take on the card. What side effects may I notice from receiving this medication? Side effects that you should report to your care team as soon as possible: Allergic reactions--skin rash,  itching, hives, swelling of the face, lips, tongue, or throat Change in vision Dehydration--increased thirst, dry mouth, feeling faint or lightheaded, headache, dark yellow or brown urine Kidney injury--decrease in the amount of urine, swelling of the ankles, hands, or feet Pancreatitis--severe stomach pain that spreads to your back or gets worse after eating or when touched, fever, nausea, vomiting Thyroid cancer--new mass or lump in the neck, pain or trouble swallowing, trouble breathing, hoarseness Side effects that usually do not require medical attention (report to your care team if they continue or are bothersome): Diarrhea Loss of appetite Nausea Stomach pain Vomiting This list may not describe  all possible side effects. Call your doctor for medical advice about side effects. You may report side effects to FDA at 1-800-FDA-1088. Where should I keep my medication? Keep out of the reach of children and pets. Refrigeration (preferred): Store unopened pens in a refrigerator between 2 and 8 degrees C (36 and 46 degrees F). Keep it in the original carton until you are ready to take it. Do not freeze or use if the medication has been frozen. Protect from light. Get rid of any unused medication after the expiration date on the label. Room Temperature: The pen may be stored at room temperature below 30 degrees C (86 degrees F) for up to a total of 14 days if needed. Protect from light. Avoid exposure to extreme heat. If it is stored at room temperature, throw away any unused medication after 14 days or after it expires, whichever is first. To get rid of medications that are no longer needed or have expired: Take the medication to a medication take-back program. Check with your pharmacy or law enforcement to find a location. If you cannot return the medication, ask your pharmacist or care team how to get rid of this medication safely. NOTE: This sheet is a summary. It may not cover all possible information. If you have questions about this medicine, talk to your doctor, pharmacist, or health care provider.  2022 [employer] (2020-07-29 00:00:00)

## 2021-03-25 LAB — URINALYSIS, ROUTINE W REFLEX MICROSCOPIC
Bilirubin Urine: NEGATIVE
Glucose, UA: NEGATIVE
Hgb urine dipstick: NEGATIVE
Hyaline Cast: NONE SEEN /LPF
Ketones, ur: NEGATIVE
Nitrite: POSITIVE — AB
Protein, ur: NEGATIVE
RBC / HPF: NONE SEEN /HPF (ref 0–2)
Specific Gravity, Urine: 1.013 (ref 1.001–1.035)
Squamous Epithelial / HPF: NONE SEEN /HPF (ref <=5)
pH: 6 (ref 5.0–8.0)

## 2021-03-25 LAB — CBC WITH DIFFERENTIAL/PLATELET
Absolute Monocytes: 700 cells/uL (ref 200–950)
Basophils Absolute: 30 cells/uL (ref 0–200)
Basophils Relative: 0.3 %
Eosinophils Absolute: 160 cells/uL (ref 15–500)
Eosinophils Relative: 1.6 %
HCT: 45.5 % (ref 38.5–50.0)
Hemoglobin: 15.5 g/dL (ref 13.2–17.1)
Lymphs Abs: 3120 cells/uL (ref 850–3900)
MCH: 28.9 pg (ref 27.0–33.0)
MCHC: 34.1 g/dL (ref 32.0–36.0)
MCV: 84.7 fL (ref 80.0–100.0)
MPV: 11.3 fL (ref 7.5–12.5)
Monocytes Relative: 7 %
Neutro Abs: 5990 cells/uL (ref 1500–7800)
Neutrophils Relative %: 59.9 %
Platelets: 201 10*3/uL (ref 140–400)
RBC: 5.37 10*6/uL (ref 4.20–5.80)
RDW: 14.1 % (ref 11.0–15.0)
Total Lymphocyte: 31.2 %
WBC: 10 10*3/uL (ref 3.8–10.8)

## 2021-03-25 LAB — MICROALBUMIN / CREATININE URINE RATIO
Creatinine, Urine: 103 mg/dL (ref 20–320)
Microalb Creat Ratio: 6 mcg/mg creat (ref <30)
Microalb, Ur: 0.6 mg/dL

## 2021-03-25 LAB — HEMOGLOBIN A1C
Hgb A1c MFr Bld: 6.9 % of total Hgb — ABNORMAL HIGH (ref <5.7)
Mean Plasma Glucose: 151 mg/dL
eAG (mmol/L): 8.4 mmol/L

## 2021-03-25 LAB — MAGNESIUM: Magnesium: 1.7 mg/dL (ref 1.5–2.5)

## 2021-03-25 LAB — COMPLETE METABOLIC PANEL WITH GFR
AG Ratio: 1.3 (calc) (ref 1.0–2.5)
ALT: 16 U/L (ref 9–46)
AST: 18 U/L (ref 10–35)
Albumin: 4.1 g/dL (ref 3.6–5.1)
Alkaline phosphatase (APISO): 51 U/L (ref 35–144)
BUN: 20 mg/dL (ref 7–25)
CO2: 25 mmol/L (ref 20–32)
Calcium: 10.1 mg/dL (ref 8.6–10.3)
Chloride: 101 mmol/L (ref 98–110)
Creat: 1.22 mg/dL (ref 0.70–1.35)
Globulin: 3.2 g/dL (calc) (ref 1.9–3.7)
Glucose, Bld: 105 mg/dL — ABNORMAL HIGH (ref 65–99)
Potassium: 4.2 mmol/L (ref 3.5–5.3)
Sodium: 138 mmol/L (ref 135–146)
Total Bilirubin: 0.4 mg/dL (ref 0.2–1.2)
Total Protein: 7.3 g/dL (ref 6.1–8.1)
eGFR: 65 mL/min/{1.73_m2} (ref >=60)

## 2021-03-25 LAB — MICROSCOPIC MESSAGE

## 2021-03-25 LAB — LIPID PANEL
Cholesterol: 183 mg/dL (ref <200)
HDL: 31 mg/dL — ABNORMAL LOW (ref >=40)
LDL Cholesterol (Calc): 105 mg/dL (calc) — ABNORMAL HIGH
Non-HDL Cholesterol (Calc): 152 mg/dL (calc) — ABNORMAL HIGH (ref <130)
Total CHOL/HDL Ratio: 5.9 (calc) — ABNORMAL HIGH (ref <5.0)
Triglycerides: 353 mg/dL — ABNORMAL HIGH (ref <150)

## 2021-03-25 LAB — PSA: PSA: 2.89 ng/mL (ref <=4.00)

## 2021-03-25 LAB — VITAMIN D 25 HYDROXY (VIT D DEFICIENCY, FRACTURES): Vit D, 25-Hydroxy: 64 ng/mL (ref 30–100)

## 2021-03-25 LAB — URIC ACID: Uric Acid, Serum: 5.7 mg/dL (ref 4.0–8.0)

## 2021-03-25 LAB — TSH: TSH: 2.65 mIU/L (ref 0.40–4.50)

## 2021-03-27 MED FILL — Allopurinol Tab 300 MG: ORAL | 90 days supply | Qty: 90 | Fill #0 | Status: AC

## 2021-03-28 ENCOUNTER — Other Ambulatory Visit (HOSPITAL_COMMUNITY): Payer: Self-pay

## 2021-03-28 ENCOUNTER — Other Ambulatory Visit: Payer: Self-pay | Admitting: Internal Medicine

## 2021-03-28 ENCOUNTER — Other Ambulatory Visit (HOSPITAL_BASED_OUTPATIENT_CLINIC_OR_DEPARTMENT_OTHER): Payer: Self-pay

## 2021-03-28 MED ORDER — EZETIMIBE 10 MG PO TABS
10.0000 mg | ORAL_TABLET | Freq: Every day | ORAL | 1 refills | Status: DC
  Filled 2021-03-28: qty 90, 90d supply, fill #0

## 2021-03-28 MED ORDER — EZETIMIBE 10 MG PO TABS
ORAL_TABLET | ORAL | 3 refills | Status: DC
  Filled 2021-03-28: qty 90, 90d supply, fill #0
  Filled 2021-06-23: qty 90, 90d supply, fill #1
  Filled 2021-09-04: qty 90, 90d supply, fill #2
  Filled 2021-12-19: qty 90, 90d supply, fill #3

## 2021-03-29 ENCOUNTER — Other Ambulatory Visit (HOSPITAL_BASED_OUTPATIENT_CLINIC_OR_DEPARTMENT_OTHER): Payer: Self-pay

## 2021-03-30 ENCOUNTER — Ambulatory Visit: Payer: Medicare Other | Admitting: Pharmacist

## 2021-03-30 ENCOUNTER — Other Ambulatory Visit: Payer: Self-pay

## 2021-03-30 DIAGNOSIS — E785 Hyperlipidemia, unspecified: Secondary | ICD-10-CM

## 2021-03-30 DIAGNOSIS — Z79899 Other long term (current) drug therapy: Secondary | ICD-10-CM

## 2021-03-30 DIAGNOSIS — Z6841 Body Mass Index (BMI) 40.0 and over, adult: Secondary | ICD-10-CM

## 2021-03-30 DIAGNOSIS — I10 Essential (primary) hypertension: Secondary | ICD-10-CM

## 2021-03-30 DIAGNOSIS — G4733 Obstructive sleep apnea (adult) (pediatric): Secondary | ICD-10-CM

## 2021-03-30 DIAGNOSIS — E1122 Type 2 diabetes mellitus with diabetic chronic kidney disease: Secondary | ICD-10-CM

## 2021-03-30 DIAGNOSIS — F325 Major depressive disorder, single episode, in full remission: Secondary | ICD-10-CM

## 2021-03-30 NOTE — Progress Notes (Signed)
Patient Visit with Chart Prep   Todd, Rice [id number] 61 years, Male  DOB: 02/20/1956  M: [id number] Care Team:  Imogene Burn, Trader  __________________________________________________ Summary: Todd Rice is a friendly and entertaining 65 year old male with a chief complaint of getting blood sugars and cholesterol under control  Recommendations/Changes made from today's visit: Recommended that patient start taking Rosuvastatin every day as tolerated to help with blood cholesterol Patient will start Trulicity this Sunday. Will check in with him in 2 weeks to assess Trulicity tolerance and in 1 month to assess at home blood sugars   Patient scheduled for CCM visit with the clinical pharmacist.  Patient is referred for CCM by their PCP and CPP is under general PCP supervision.: At least 2 of these conditions are expected to last 12 months or longer and patient is at significant risk for acute exacerbations and/or functional decline.  Patient has consented to participation in [facility] program. Visit Type: Phone Call Date of Upcoming Visit: 03/30/2021  Patient Chart Prep [facility])  Chronic Conditions Patient's Chronic Conditions: Cardiovascular Disease (CVD), Hypertension (HTN), Asthma, Allergic rhinitis, Other, Chronic Kidney Disease (CKD), Diabetes (DM), Hyperlipidemia/Dyslipidemia (HLD), Depression, Gout, Constipation List Other Conditions (separated by comma): OSA on BiPAP, Hepatic steatosis, Parkinson's disease, Primary renal papillary carcinoma, vitamin D deficiency  Doctor and Hospital Visits Were there PCP Visits in last 6 months?: Yes Visit #1: 03/24/2021-  [name], NP- Patient was seen for routine health exam. Started Dulaglutide 0.75mg  subq weekly. Stopped Amoxicillin-Pot Clavulanate, hydrocodone-acetaminophen, and sitagliptin.  Were there Specialist Visits in last 6 months?: Yes Visit #1: 03/03/2021, 03/01/2021, 02/24/2021, 02/22/2021, 02/10/2021, 02/08/2021,  02/03/2021, 02/01/2021- Vianne Bulls, OT/L (occupational therapy)- Patient seen for outpatient rehab. No medication changes. Visit #2: 02/01/2021- Mady Haagensen, PT (physical therapy)- Patient was seen for outpatient rehab. No medication changes.  Visit #3: 02/01/2021- Garald Balding, CCC-SLP (speech pathology)- Patient was seen for outpatient rehab. No medication changes.   Visit #4: 01/31/2021- Dr. [name] (neurology)- Patient was seen for Parkinson's disease. No medication changes.  Was there a Hospital Visit in last 30 days?: No Were there other Hospital Visits in last 6 months?: No  Medication Information Are there any Medication discrepancies?: No Are there any Medication adherence gaps (beyond 5 days past due)?: No Medication adherence rates for the STAR rating drugs: None List Patient's current Care Gaps: Need Eye Exam Documented in EHR or by Claim  Pre-Call Questions [facility]) Are you able to connect with Patient: Yes Visit Type: Phone Call May we confirm what is the best phone # for the pharmacist to call you?: [phone] Confirm visit date/time: 11/23 @ 1:30 Have you seen any other providers since your last visit?: No Any changes in your medicines or health?: No Any side effects from any medications?: No Do you have any symptoms or problems not managed by your medications?: No What are your top 2 health concerns right now?: lose more weight  get cholesterol down What are your top 2 medication concerns right now?: Other Details: None Has your Dr asked that you take BP, BG or special diet at home?: Monitor BG, Exercise Do you get any type of exercise on a regular basis?: Yes What type of exercise and how often?: 3-4x weekly- at [facility] gym What are your top 1 or 2 goals for your health?: maintain good BG levels What are you doing to try to reach these goals?: Monitoring BG, Taking medications, Exercise What, if any, problems do you have getting your medications  from the pharmacy?:  None Is there anything else that you would like to discuss during the appointment?: No  Disease Assessments  Subjective Information Current BP: 118/74 Current HR: 75 taken on: 03/24/2021 Previous BP: 123/83 Previous HR: 72 taken on: 01/30/2021 Weight: 262 BMI: 42.08 Last GFR: 65 taken on: 03/24/2021 Why did the patient present?: CCM initial visit Marital status?: Married Details: Pt did not specify Retired? Previous work?: Retired. Worked at [employer] for 31 years What does the patient do during the day?: Patient states that due to Parkinsons, some days are beter than others. Pt goes to the gym in the afternoon 3-4 times a week. Does alot of housework on other days Who does the patient spend their time with and what do they do?: Lives with wife and spends time with wife when she is not working. She works M-Thurs Lifestyle habits such as diet and exercise?: Diet:  Exercise: Patient works out 3-4 times a week at [employer], 1 - 1.5 hours a day Alcohol, tobacco, and illicit drug usage?: None    What is the patient's sleep pattern?: No sleep issues How many hours per night does patient typically sleep?: 6-8 hours CPAP machine 1/2 Ambien tablet to sleep if needed Patient pleased with health care they are receiving?: Yes Family, occupational, and living circumstances relevant to overall health?: His family history includes Cancer in his father; Heart disease in his brother, maternal aunt, mother, and paternal grandmother; Hyperlipidemia in his brother, maternal aunt, mother, and paternal grandmother; Hypertension in his maternal aunt and mother; Lung cancer in his father; Melanoma in his brother; Stroke in his maternal aunt. Name and location of Current pharmacy: [place] [facility] (Ph: [phone]) Current Rx insurance plan: UHC Are meds synced by current pharmacy?: No Are meds delivered by current pharmacy?: No - delivery not available Would  patient benefit from direct intervention of clinical lead in dispensing process to optimize clinical outcomes?: Yes Are [facility] pharmacy services available where patient lives?: Yes Is patient disadvantaged to use [facility]?: No [facility] services reviewed with patient and patient wishes to change pharmacy?: No Select reason patient declined to change pharmacies: Prefers to go to the pharmacy / reason to leave the house Does patient experience delays in picking up medications due to transportation concerns (getting to pharmacy)?: No Any additional demeanor/mood notes?: Braelin is a friendly and entertaining 65 year old male with a chief complaint of getting blood sugars and cholesterol under control  Hypertension (HTN) Assess this condition today?: Yes Is patient able to obtain BP reading today?: No Goal: <130/80 mmHG Hypertension Stage: Normal (SBP <120 and DBP < 80) Is Patient checking BP at home?: No How often does patient miss taking their blood pressure medications?: Never. Has patient experienced hypotension, dizziness, falls or bradycardia?: No Check present secondary causes (below) for HTN: Obesity, CKD, Sleep Apnea BP RPM device: Does patient qualify?: Yes We discussed: Targeting 150 minutes of aerobic activity per week, Reducing the amount of salt intake to 1500mg /per day., Recommend using a salt substitute to replace your salt if you need flavor., DASH diet:  following a diet emphasizing fruits and vegetables and low-fat dairy products along with whole grains, fish, poultry, and nuts. Reducing red meats and sugars., Proper Home BP Measurement, Weight reduction- We discussed losing 5-10% of body weight. Assessment:: Controlled Drug: Valsartan/ HCTZ 320/25 mg QD Assessment: Appropriate, Effective, Safe, Accessible HC Follow up: N/A Pharmacist Follow up: Continue to monitor BP levels in  office and at home Monitor electrolytes, SCr, angioedema, Lipids, blood  sugar  Hyperlipidemia/Dyslipidemia (HLD) Last Lipid panel on: 03/24/2021 TC (Goal<200): 183 LDL: 105 HDL (Goal>40): 31 TG (Goal<150): 353 ASCVD 10-year risk?is:: High (>20%) ASCVD Risk Score: 30.1% Assess this condition today?: Yes LDL Goal: <70 Has patient tried and failed any HLD Medications?: No Check present secondary causes (below) that can lead to increased cholesterol levels (multi-choice optional): Diabetes, CKD We discussed: How to reduce cholesterol through diet/weight management and physical activity., How a diet high in plant sterols (fruits/vegetables/nuts/whole grains/legumes) may reduce your cholesterol., Encouraged increasing fiber to a daily intake of 10-25g/day Assessment:: Uncontrolled Drug: Rosuvastatin 40mg  QD  Assessment: Appropriate, Query Effectiveness Drug: Ezetemibe 10mg  QD Assessment: Appropriate, Query Effectiveness Plan to Counsel: Encouraged patient to take Rosuvastatin every day to help with cholesterol levels. Take at night if this helps decrease constipation symptoms HC Follow up: N/A Pharmacist Follow up: Check lipid panel February, TSH  Diabetes (DM) Current A1C: 6.9 taken on: 03/24/2021 Previous A1C: 7.1 taken on: 09/26/2020 Type: 2 The current microalbumin ratio is: 6 tested on: 03/24/2021 Assess this condition today?: Yes Goal A1C: < 7.0 % Type: 2 Is Patient taking statin medication?: Yes Is patient taking ACEi / ARB?: Yes DM RPM device: Does patient qualify?: Yes Patient checking Blood Sugar at home?: Yes Does patient use a Continuous Glucose Monitoring (CGM) device?: No How often does patient check Blood Sugars?: 2 Recent fasting Blood sugar reading: 145, 153, 125 Has patient experienced any hypoglycemic episodes?: No Diet recall discussed: Yes Breakfast: Scrambled eggs, bacon, sausage patty ([place]). Whole wheat bread sometimes. 2 Pancakes on a stick. Weekends Research officer, trade [employer] Lunch: Skips lunch Supper: Shredded chicken, Therapist, sports, Beverages: Water, sugar free juices or soda but rarely drinks We discussed: How to recognize and treat signs of hypoglycemia., Diet and exercise extensively, Modifying lifestyle, including to participate in moderate physical activity (e.g., walking) at least 150 minutes per week., Other Details: Informed patients of coomon foods that have high mounts of carbs: pasta, white rice, mashed potatoes, green beans, corn, beans Assessment:: Controlled Drug: Trulicity 0.75mg  inject 1 time a week Assessment: Appropriate, Effective, Safe, Accessible Drug: Glipizide 5mg  BID Assessment: Appropriate, Effective, Safe, Accessible Drug: Metformin 500mg  XR BID Additional Info: Pt tries not to overeat and works on portion control. He stated his last 3 front teeth taken out so it has made it a little more difficult to eat.  He also stated that he will be starting Trulicity this Sunday.  HC Follow up: Call patient in 2 weeks to see if he has noticed any side effects with Trulicity. Ask about Nausea vomiting or diarrhea or any injection site reactions Diabetes assessment in early January. Collect blood glucose reading fasting and post prandial. Pharmacist Follow up: Follow up on blood sugars. Would hope to see a decrease in fasting BG as pt has started Trulicity  Depression Previous PHQ-9 Score: (0) 03/24/21 Assess this condition today?: Yes Completing the PHQ-9 Questionnaire today?: No In your opinion, how do you feel your depression symptoms have been controlled over the past 3 months?: Improved Patient has tried and failed: Get involved in doing other things to clear your mind. Biggest concern is Parkinson's. Support group every month and physical therapy on the  2nd Wednesday every month. Occupational therapy. 1 time a year 10 weeks of physical therapy. Assessment:: Controlled Drug: None Assessment: Query need HC Follow up: N/A Pharmacist Follow up: Continue to monitor for depressed mood and  symptoms  Chronic  kidney disease (CKD) Previous GFR: 65 taken on: 03/24/2021 The current microalbumin ratio is: 6 tested on: 03/24/2021 Assess this condition today?: Yes CKD Stage: Stage 2 (GFR 61-90 ml/min) Albuminuria Stage: A1 (<30) Contributing factors for developing CKD: Diabetes, HTN Is Patient taking statin medication: Yes Is patient taking ACEi / ARB?: Yes Renal dose adjustments recommended?: No We discussed: Limiting dietary sodium intake to less than 2000 mg / day, Maintaining blood pressure control, Maintaining blood glucose control, Avoidance of nephrotoxic drugs (NSAIDs) Assessment:: Controlled Drug: Valsartan/hydrochlorothiazide 320/25mg  QD Assessment: Appropriate, Effective, Safe, Accessible HC Follow up: N/A Pharmacist Follow up: Continue to assess, GFR, SCr, and blood glucose and BP control at each visit. Adjust meds as necessary  Exercise, Diet and Non-Drug Coordination Needs Additional exercise counseling points. We discussed: targeting at least 150 minutes per week of moderate-intensity aerobic exercise., incorporating flexibility, balance, and strength training exercises Additional diet counseling points. We discussed: key components of a low-carb eating plan Discussed Non-Drug Care Coordination Needs: Yes Does Patient have Medication financial barriers?: No  Accountable Health Communities Health-Related Social Needs Screening Tool -  SDOH  ([email or web address]) What is your living situation today? (ref #1): I have a steady place to live Think about the place you live. Do you have problems with any of the following? (ref #2): None of the above Within the past 12 months, you worried that your food would run out before you got money to buy more (ref #3): Never true Within the past 12 months, the food you bought just didn't last and you didn't have money to get more (ref #4): Never true In the past 12 months, has  lack of reliable transportation kept you from medical appointments, meetings, work or from getting things needed for daily living? (ref #5): No In the past 12 months, has the electric, gas, oil, or water company threatened to shut off services in your home? (ref #6): No How often does anyone, including family and friends, physically hurt you? (ref #7): Never (1) How often does anyone, including family and friends, insult or talk down to you? (ref #8): Never (1) How often does anyone, including friends and family, threaten you with harm? (ref #9): Never (1) How often does anyone, including family and friends, scream or curse at you? (ref #10): Never (1) Engagement Notes Todd Rice on 04/04/2021 05:05 PM  HC F/u:  Call patient in 2 weeks to see if he has noticed any side effects with Trulicity. Ask about Nausea vomiting or diarrhea or any injection site reactions Diabetes assessment in early January. Collect blood glucose reading fasting and post prandial.   CPP F/u:  06/23/21 - OV w/ Caryl Pina 09/20/21 Ok Edwards w/ Ashley   Care Gaps:  Shingrix Vaccine: Pt stated that he will schedule Opthamolagy exam: Pt has scheduled   Engagement Notes Todd Rice on 03/30/2021 12:02 PM HC Chart Prep: 88mins CPP Chart Review: 19 min  CPP Office Visit: 57 min  CPP Office Visit Documentation: 75 min CPP Coordination of Care:  [facility] Care Plan Completion: 10 min CPP Care Plan Review: 14 min  CARE PLAN  Patient Name: Todd Rice DOB:??[birth year]  Last Care Plan Update:?04/05/2021  COMPREHENSIVE CARE PLAN AND GOALS?   HYPERTENSION?   MOST RECENT BLOOD PRESSURE:? Current BP: 118/74  Current HR: 75  taken on: 03/24/2021  Previous BP: 123/83  Previous HR: 72  taken on: 01/30/2021   MY GOAL BLOOD PRESSURE:?<130/80  CURRENT MEDICATION AND DOSING:?   Valsartan/ HCTZ 320/25 mg every  day   THE GOALS WE HAVE CHOSEN ARE:??  Check your blood pressure daily and record it in your log. If consistently  greater than 130/80 call pharmacy team or provider.  Targeting  150 minutes of aerobic activity per week, Reducing the amount of salt intake to 1500mg /per day., Weight reduction- losing 5-10% of body weight.,  Maintain an at goal blood pressure?   PLAN TO WORK ON THESE GOALS:?   Take medications regularly??  Check BP at home?every day and keep a log. Bring to your doctor and pharmacist appointments  Reduce salt intake (< 1500mg / day)?  Diet: DASH diet (Choose fruits, vegetables, and low-fat dairy products. Increase whole grains, fish, poultry, nuts. Reduce red meats and sugars)?  Weight: 1 kg = ~1 mmHg reduction?  Exercise?    CHOLESTEROL?   MOST RECENT LABS:????03/24/2021  TOTAL CHOLESTEROL:?183  TRIGLYCERIDES:?353  HDL:?31  LDL:?105  CURRENT MEDICATION AND DOSING:?  Rosuvastatin 40mg  every day  Ezetimibe 10mg  every day   THE GOALS WE HAVE CHOSEN ARE:??   Total Cholesterol goal under 200, Triglycerides goal under 150, HDL goal above 40, LDL goal under 70  BARRIERS TO ACHIEVING GOALS:?   Not taking statin daily  PLAN TO WORK ON THESE GOALS:?   Take medication , especially take Rosuvastatin every day to help with cholesterol levels. Take at night if this helps decrease constipation symptoms   Diet: high in plant sterols (fruits/ vegetables/ nuts/ whole grains/ legumes). Increase fiber intake (10-25g/day). Avoid foods high in cholesterol (red meat, egg yolks, dairy, oils/ butter). Choose low-fat options.?  Exercise?  Weight Management?    DIABETES?   MOST RECENT A1C:???? 6.9 on 03/24/2021  7.1 on 09/26/2020   MY GOAL A1C:?<7%  CURRENT MEDICATION AND DOSING:?   Trulicity 0.75mg  inject 1 time a week  Glipizide 5mg  2 times a day  Metformin 500mg  XR take 2 tablets 2 times a day   THE GOALS WE HAVE CHOSEN ARE:??   Maintain an at goal A1C level?    PLAN TO WORK ON THESE GOALS:?   -Take medications as prescribed?  -Check blood glucose  2 times a day before breakfast and 2  hours after a meal  -Diet: natural carbs and sugars (vegetables, fruits, whole grains, legumes, dairy), avoid alcohol, reduce carbohydrate intake and processed sugars. Maintain a low carbohydrate diet, eating 45 grams of carbohydrates per meal (3 servings of carbohydrates per meal), 15 grams of carbohydrates per snack (1 serving of carbohydrate per snack)?  -Weight Loss: waist circumference goal < 35 inches for females; < 40 inches for males?  -Exercise: 150 minutes of moderate-intensity aerobic activity per week (at least 3 days)?   -Foot care: wash, dry examine feet daily. Moisturize the top and bottom (not in between). Trim nails with nail file (no sharp edges). Wear socks and shoes. Elevate feet when sitting. Annual foot exam by podiatrist.?   Recognize sign and symptoms of hypoglycemia and understand treatment as outlined below?   -Hypoglycemia sign and symptoms: dizziness, anxiety/ irritability, shakiness, headache, diaphoresis, hunger, confusion, nausea, ataxia, tremors, palpitations/ tachycardia, blurred vision?  -Hypoglycemia Treatment: "Rule of 15": ?  (1) 15-20 grams of glucose/ simple carb (4 oz juice, 8oz milk, 4oz regular soda, 1 tablespoon sugar/ honey/ corn syrup, 3-4 glucose tabs/ 1 serving glucose gel)?  (2) Recheck BG after 15 mins. If hypoglycemia continues?  (3) Repeat steps 1&2, when BG normalizes, eat small meal or snack?    ? Depression?   CURRENT REGIMEN AND DOSING:?  None  THE GOALS WE HAVE CHOSEN ARE:?   Lower and manage symptoms of depression?   PLAN TO WORK ON THESE GOALS:???   -Continue medication as prescribed?  -Inform practitioner of any signs and symptoms of worsening depression?  Symptoms: persistent feeling of hopelessness, dejection, constant worry, poor concentration, lack of energy, inability to sleep, sometimes suicidal tendencies, agitation, decreased concentration, sleep changes, diminished interest/ pleasure, changes in appetite?    Chronic  Kidney Disease?   Labs:?   Previous GFR: 65 taken on: 03/24/2021  The current microalbumin ratio is: 6 tested on: 03/24/2021 CKD Stage: Stage 2 (GFR 61-90 ml/min) Albuminuria Stage:  A1 (<30)  PLAN TO WORK ON THESE GOALS:???   -Maintaining normal BP and BG, avoid NSAIDs such as ibuprofen and naproxen?  -Diet: Decrease salt and fatty foods. Increase fruit and vegetables?     ACTIVE MEDICATION LIST?   Your current medication list has been updated. To view, log in to your patient portal.?   Call if any changes need to be made.?    ?  MEDICATION REVIEW?  MEDICATION REVIEW CONDUCTED:?? Yes?  DATE:??? 04/05/2021 BEST POSSIBLE MEDICATION HISTORY?  SOURCE:?? Medical Records?   ?  HOW DO I? - WHEN DO I??   GET AHOLD OF MY DOCTOR DURING BUSINESS HOURS WHEN THE OFFICE IS OPEN?  PHONE NUMBER: [phone]?    AFTER HOURS [facility] NURSE WHEN THE OFFICE IS CLOSED?? PHONE: [phone]?   TALK TO MY CARE COORDINATOR??  NAME: Todd Rice  PHONE: [phone]   EMAIL:??   [email or web address]?   CARE COORDINATOR STAFF?  Rosary Lively, [address] [place], [place]?  Contact Phone Number: [phone]?      NOTE SECTION  Thank you for participating in the Chronic Care Management (CCM) program with Dr. Melford Aase    This program takes a proactive approach to your health and my team will serve as a resource for you throughout the year. Please follow up at [phone] if you have any questions or experience changes to your overall health. Your next CCM appointment will be conducted with Todd Rice, PharmD as follows:    Date 07/19/2021  Time  9:00 am  Office Visit         Rachelle Hora. Jeannett Senior, PharmD  Clinical Pharmacist  [email or web address]  [phone]

## 2021-04-06 ENCOUNTER — Telehealth: Payer: Self-pay

## 2021-04-06 DIAGNOSIS — I10 Essential (primary) hypertension: Secondary | ICD-10-CM | POA: Diagnosis not present

## 2021-04-06 DIAGNOSIS — G4733 Obstructive sleep apnea (adult) (pediatric): Secondary | ICD-10-CM | POA: Diagnosis not present

## 2021-04-06 DIAGNOSIS — E785 Hyperlipidemia, unspecified: Secondary | ICD-10-CM | POA: Diagnosis not present

## 2021-04-06 DIAGNOSIS — E1169 Type 2 diabetes mellitus with other specified complication: Secondary | ICD-10-CM | POA: Diagnosis not present

## 2021-04-06 NOTE — Progress Notes (Signed)
Care plan printed and mailed to patient  Lilibeth Opie, Health Concierge 

## 2021-04-18 ENCOUNTER — Telehealth: Payer: Self-pay | Admitting: Neurology

## 2021-04-18 DIAGNOSIS — R063 Periodic breathing: Secondary | ICD-10-CM

## 2021-04-18 DIAGNOSIS — G4752 REM sleep behavior disorder: Secondary | ICD-10-CM

## 2021-04-18 DIAGNOSIS — G4731 Primary central sleep apnea: Secondary | ICD-10-CM

## 2021-04-18 NOTE — Telephone Encounter (Signed)
Pt has called to report that [facility] has not received the order yet for his new Bi PAP, please call.

## 2021-04-18 NOTE — Telephone Encounter (Signed)
Pt is currently on BiPAP and not ASV machine. Corrected the order to reflect to set up with new machine, with the current settings he has. BiPAP Max IPAP @ 17, Min EPAP @ 10 .... with a PS of 4 cm

## 2021-04-18 NOTE — Telephone Encounter (Signed)
See previous telephone note from a month ago. They should have his order on file. Since the patient is being told different, I have contaced the [facility] health management team to check in on this and contact the patient back.

## 2021-04-18 NOTE — Addendum Note (Signed)
Addended by: Darleen Crocker on: 04/18/2021 11:39 AM   Modules accepted: Orders

## 2021-04-19 NOTE — Telephone Encounter (Signed)
Per [facility] management team, the pt is good to go on their end to get new machine. She stated that they would be in touch with him to get him set up.

## 2021-04-25 NOTE — Telephone Encounter (Signed)
Error

## 2021-05-05 DIAGNOSIS — E1169 Type 2 diabetes mellitus with other specified complication: Secondary | ICD-10-CM | POA: Diagnosis not present

## 2021-05-05 DIAGNOSIS — I10 Essential (primary) hypertension: Secondary | ICD-10-CM | POA: Diagnosis not present

## 2021-05-05 DIAGNOSIS — E785 Hyperlipidemia, unspecified: Secondary | ICD-10-CM | POA: Diagnosis not present

## 2021-05-11 ENCOUNTER — Telehealth: Payer: Self-pay | Admitting: Neurology

## 2021-05-11 DIAGNOSIS — G4733 Obstructive sleep apnea (adult) (pediatric): Secondary | ICD-10-CM | POA: Diagnosis not present

## 2021-05-11 NOTE — Telephone Encounter (Signed)
Pt was scheduled for Initial CPAP visit on 08/04/21. Pt was informed to bring machine and power cord to appt DME: [facility] Phone: [phone] Equipment issued: Resmed Airfit F20 on 05/11/21 Pt to be scheduled between: 06/11/21-08/09/2021

## 2021-05-25 ENCOUNTER — Other Ambulatory Visit: Payer: Self-pay | Admitting: Adult Health

## 2021-05-25 ENCOUNTER — Other Ambulatory Visit (HOSPITAL_BASED_OUTPATIENT_CLINIC_OR_DEPARTMENT_OTHER): Payer: Self-pay

## 2021-05-25 DIAGNOSIS — I10 Essential (primary) hypertension: Secondary | ICD-10-CM

## 2021-05-25 MED ORDER — VALSARTAN-HYDROCHLOROTHIAZIDE 320-25 MG PO TABS
1.0000 | ORAL_TABLET | Freq: Every day | ORAL | 1 refills | Status: DC
  Filled 2021-05-25: qty 90, 90d supply, fill #0
  Filled 2021-08-22: qty 90, 90d supply, fill #1

## 2021-05-26 ENCOUNTER — Other Ambulatory Visit: Payer: Self-pay | Admitting: Adult Health

## 2021-05-26 ENCOUNTER — Other Ambulatory Visit (HOSPITAL_BASED_OUTPATIENT_CLINIC_OR_DEPARTMENT_OTHER): Payer: Self-pay

## 2021-05-26 MED ORDER — ALPRAZOLAM 0.5 MG PO TABS
ORAL_TABLET | ORAL | 0 refills | Status: DC
  Filled 2021-05-26: qty 90, 30d supply, fill #0

## 2021-05-26 NOTE — Progress Notes (Signed)
Future Appointments  Date Time Provider [place]  06/07/2021  8:30 AM Ward Givens, NP [facility] None  06/23/2021  3:30 PM [name], NP [facility] None  07/19/2021  9:00 AM Newton Pigg, RPH [facility] None  08/02/2021  8:30 AM Penumalli, Earlean Polka, MD [facility] None  08/04/2021 11:30 AM Ward Givens, NP [facility] None  09/01/2021  7:15 AM Delton Prairie, OT [facility]  09/20/2021  4:00 PM [name], NP [facility] None  03/28/2022  3:00 PM [name], NP [facility] None    PDMP reviewed for xanax refill request.

## 2021-05-30 ENCOUNTER — Ambulatory Visit: Payer: Medicare Other | Admitting: Nurse Practitioner

## 2021-06-06 ENCOUNTER — Encounter (HOSPITAL_BASED_OUTPATIENT_CLINIC_OR_DEPARTMENT_OTHER): Payer: Self-pay

## 2021-06-06 ENCOUNTER — Other Ambulatory Visit (HOSPITAL_BASED_OUTPATIENT_CLINIC_OR_DEPARTMENT_OTHER): Payer: Self-pay

## 2021-06-07 ENCOUNTER — Other Ambulatory Visit (HOSPITAL_BASED_OUTPATIENT_CLINIC_OR_DEPARTMENT_OTHER): Payer: Self-pay

## 2021-06-07 ENCOUNTER — Encounter: Payer: Self-pay | Admitting: Adult Health

## 2021-06-07 ENCOUNTER — Ambulatory Visit: Payer: Medicare Other | Admitting: Adult Health

## 2021-06-07 VITALS — BP 124/76 | HR 67 | Ht 67.0 in | Wt 262.4 lb

## 2021-06-07 DIAGNOSIS — G20 Parkinson's disease: Secondary | ICD-10-CM

## 2021-06-07 DIAGNOSIS — G20A1 Parkinson's disease without dyskinesia, without mention of fluctuations: Secondary | ICD-10-CM

## 2021-06-07 NOTE — Progress Notes (Signed)
PATIENT: Todd Rice DOB: 1955-11-05  REASON FOR VISIT: follow up HISTORY FROM: patient PRIMARY NEUROLOGIST: Dr. Leta Baptist- was with Dr. Patsy [place])  HISTORY OF PRESENT ILLNESS: Today 06/07/21:  Todd Rice is a 66 year old male with a history of Parkinson's disease.  He returns today for follow-up.  The patient continues to have a tremor in the right arm.  He states it is worse with stress.  Reports that his balance is a little off.  He is going to therapy.  Has an evaluation every 6 months.  His next evaluation is March 6.  Denies any trouble with his swallowing.  Currently has a new CPAP machine.  Reports that he is sleeping on average about 5 hours most nights.  He has an appointment next month for initial CPAP compliance.  HISTORY (copied from Dr. Tobey Grim note) Todd Rice is a 66 year old right-handed white male with a history of Parkinson's disease.  He has been going through physical and occupational therapy with some benefit.  He is trying to stay active, he goes to the gym on a regular basis.  He will walk regularly.  He may have occasional "slow days" where he does not feel that he functions as well but usually he gets a smooth response from the Sinemet.  He is sleeping well at night, he is on CPAP for sleep apnea.  He has a good energy level during the day.  He has not any falls, he denies problems with swallowing.    REVIEW OF SYSTEMS: Out of a complete 14 system review of symptoms, the patient complains only of the following symptoms, and all other reviewed systems are negative.  See HPI  ALLERGIES: Allergies  Allergen Reactions   Ciprofloxacin Hives   Ceftriaxone Hives    HOME MEDICATIONS: Outpatient Medications Prior to Visit  Medication Sig Dispense Refill   allopurinol (ZYLOPRIM) 300 MG tablet TAKE 1 TABLET BY MOUTH ONCE A DAY TO TO PREVENT GOUT 90 tablet 3   ALPRAZolam (XANAX) 0.5 MG tablet Take 1 tab up to three times a day only if needed for severe  anxiety or panic attack. Avoid daily use. (Patient taking differently: as needed.) 90 tablet 0   aspirin EC 81 MG tablet Take 81 mg by mouth at bedtime.     carbidopa-levodopa (SINEMET IR) 25-250 MG tablet TAKE 1 TABLET BY MOUTH 3 TIMES DAILY 270 tablet 3   Dulaglutide (TRULICITY) 6.31 SH/7.67YO SOPN Inject 0.75 mg into the skin once a week. 1 mL 0   ezetimibe (ZETIA) 10 MG tablet Take  1 tablet  Daily  for Cholesterol 90 tablet 3   Magnesium 250 MG TABS Take 1 tablet by mouth daily.     metFORMIN (GLUCOPHAGE-XR) 500 MG 24 hr tablet TAKE 2 TABLETS BY MOUTH 2 TIMES DAILY 360 tablet 3   mometasone (NASONEX) 50 MCG/ACT nasal spray PLACE 2 SPRAYS INTO THE NOSE DAILY. (Patient taking differently: as needed.) 17 g 12   polyethylene glycol powder (GLYCOLAX/MIRALAX) 17 GM/SCOOP powder Take 1 Container by mouth daily.     pramipexole (MIRAPEX) 1 MG tablet Take 1 tablet (1 mg total) by mouth 3 (three) times daily. 270 tablet 3   rosuvastatin (CRESTOR) 40 MG tablet Take 1 tablet (40 mg total) by mouth daily. 90 tablet 1   senna (SENOKOT) 8.6 MG tablet Take 1 tablet by mouth daily.     valsartan-hydrochlorothiazide (DIOVAN-HCT) 320-25 MG tablet TAKE 1 TABLET BY MOUTH ONCE A DAY FOR BLOOD PRESSURE 90 tablet 1  vitamin C (ASCORBIC ACID) 500 MG tablet Take 1 tablet (500 mg total) by mouth 3 (three) times daily. 21 tablet 0   VITAMIN D PO Take 5,000 Units by mouth 2 (two) times daily.     zolpidem (AMBIEN) 5 MG tablet Take 1 tablet (5 mg total) by mouth at bedtime as needed for sleep. 30 tablet 0   albuterol (VENTOLIN HFA) 108 (90 Base) MCG/ACT inhaler INHALE 2 PUFFS EVERY 4 HOURS (15 MINUTES APART) FOR ASTHMA RESCUE 25.5 g 0   glipiZIDE (GLUCOTROL) 5 MG tablet Take 1 tablet (5 mg total) by mouth 2 (two) times daily before a meal. 60 tablet 2   No facility-administered medications prior to visit.    PAST MEDICAL HISTORY: Past Medical History:  Diagnosis Date   Adult BMI 50.0-59.9 kg/sq m    52.77   Anal  fissure    Anxiety    Diabetes mellitus without complication (HCC)    borderline   Elevated PSA 08/13/2019   GERD (gastroesophageal reflux disease)    30 years ago, maybe from peptic ulcer   H/O: gout    STABLE   Hepatic steatosis 10/01/11   History of kidney stones    Hyperlipidemia    Hypertension    Kidney tumor    right upper kidney tumor, monitoring   OSA on CPAP    [facility] [facility]   Parkinson disease ([place]) 01/04/2016   REM sleep behavior disorder 12/06/2016   Right ureteral stone    Ureteral calculus, right 06/12/2011   Vitamin D deficiency    Weakness     PAST SURGICAL HISTORY: Past Surgical History:  Procedure Laterality Date   ANAL FISSURE REPAIR  10-12-2000   COLONOSCOPY N/A 07/01/2012   Procedure: COLONOSCOPY;  Surgeon: Lafayette Dragon, MD;  Location: [facility];  Service: Endoscopy;  Laterality: N/A;   CYSTOSCOPY WITH RETROGRADE PYELOGRAM, URETEROSCOPY AND STENT PLACEMENT Bilateral 02/26/2013   Procedure: CYSTOSCOPY WITH RETROGRADE PYELOGRAM, URETEROSCOPY AND STENT PLACEMENT;  Surgeon: Alexis Frock, MD;  Location: [facility];  Service: Urology;  Laterality: Bilateral;   HOLMIUM LASER APPLICATION Bilateral 93/81/0175   Procedure: HOLMIUM LASER APPLICATION;  Surgeon: Alexis Frock, MD;  Location: [facility];  Service: Urology;  Laterality: Bilateral;   kidney stone removal     06/2011-stent placement   LEFT MEDIAL FEMORAL CONDYLE DEBRIDEMENT & DRILLING/ REMOVAL LOOSE BODY  09-15-2003   NASAL SEPTUM SURGERY  1985   STONE EXTRACTION WITH BASKET  06/12/2011   Procedure: STONE EXTRACTION WITH BASKET;  Surgeon: Claybon Jabs, MD;  Location: [facility];  Service: Urology;  Laterality: Right;    FAMILY HISTORY: Family History  Problem Relation Age of Onset   Heart disease Mother        Atrial fibrillation   Hypertension Mother    Hyperlipidemia Mother    Lung cancer Father        was a smoker   Cancer Father        lung   Hyperlipidemia Brother    Melanoma  Brother    Heart disease Brother        Amyloid   Heart disease Maternal Aunt    Hyperlipidemia Maternal Aunt    Hypertension Maternal Aunt    Stroke Maternal Aunt    Heart disease Paternal Grandmother    Hyperlipidemia Paternal Grandmother    Parkinson's disease Neg Hx     SOCIAL HISTORY: Social History   Socioeconomic History   Marital status: Married    Spouse name:  Dawn   Number of children: 0   Years of education: Not on file   Highest education level: Not on file  Occupational History    Comment: retired  Tobacco Use   Smoking status: Former   Smokeless tobacco: Never  Scientific laboratory technician Use: Never used  Substance and Sexual Activity   Alcohol use: No   Drug use: No    Comment: QUIT SMOKING " POT" 73 YRS AGO   Sexual activity: Yes    Partners: Female    Birth control/protection: Post-menopausal  Other Topics Concern   Not on file  Social History Narrative   Right-handed.   No caffeine use.   Lives at home with his wife.   Social Determinants of Health   Financial Resource Strain: Not on file  Food Insecurity: Not on file  Transportation Needs: Not on file  Physical Activity: Not on file  Stress: Not on file  Social Connections: Not on file  Intimate Partner Violence: Not on file      PHYSICAL EXAM  Vitals:   06/07/21 0839  BP: 124/76  Pulse: 67  Weight: 262 lb 6.4 oz (119 kg)  Height: 5\' 7"  (1.702 m)   Body mass index is 41.1 kg/m.  Generalized: Well developed, in no acute distress   Neurological examination  Mentation: Alert oriented to time, place, history taking. Follows all commands speech and language fluent Cranial nerve II-XII: Pupils were equal round reactive to light. Extraocular movements were full, visual field were full on confrontational test. Facial sensation and strength were normal. Uvula tongue midline. Head turning and shoulder shrug  were normal and symmetric. Motor: The motor testing reveals 5 over 5 strength of all 4  extremities. Good symmetric motor tone is noted throughout.  Moderate impairment of rapid alternating movement in the upper extremities.  Moderate impairment of toe taps in the lower extremities Sensory: Sensory testing is intact to soft touch on all 4 extremities. No evidence of extinction is noted.  Coordination: Cerebellar testing reveals good finger-nose-finger and heel-to-shin bilaterally.  Resting tremor noted in the right upper extremity. Gait and station: Patient is able to stand without assistance.  Good stride and arm swing.  1 step for turns.  Tandem gait not attempted.   DIAGNOSTIC DATA (LABS, IMAGING, TESTING) - I reviewed patient records, labs, notes, testing and imaging myself where available.  Lab Results  Component Value Date   WBC 10.0 03/24/2021   HGB 15.5 03/24/2021   HCT 45.5 03/24/2021   MCV 84.7 03/24/2021   PLT 201 03/24/2021      Component Value Date/Time   NA 138 03/24/2021 1636   K 4.2 03/24/2021 1636   CL 101 03/24/2021 1636   CO2 25 03/24/2021 1636   GLUCOSE 105 (H) 03/24/2021 1636   BUN 20 03/24/2021 1636   CREATININE 1.22 03/24/2021 1636   CALCIUM 10.1 03/24/2021 1636   PROT 7.3 03/24/2021 1636   ALBUMIN 3.8 10/24/2016 1601   AST 18 03/24/2021 1636   ALT 16 03/24/2021 1636   ALKPHOS 58 10/24/2016 1601   BILITOT 0.4 03/24/2021 1636   GFRNONAA 58 (L) 09/27/2020 1008   GFRAA 68 09/27/2020 1008   Lab Results  Component Value Date   CHOL 183 03/24/2021   HDL 31 (L) 03/24/2021   LDLCALC 105 (H) 03/24/2021   TRIG 353 (H) 03/24/2021   CHOLHDL 5.9 (H) 03/24/2021   Lab Results  Component Value Date   HGBA1C 6.9 (H) 03/24/2021   Lab  Results  Component Value Date   EGBTDVVO16 073 09/26/2018   Lab Results  Component Value Date   TSH 2.65 03/24/2021      ASSESSMENT AND PLAN 66 y.o. year old male  has a past medical history of Adult BMI 50.0-59.9 kg/sq m, Anal fissure, Anxiety, Diabetes mellitus without complication ([place]), Elevated PSA  (08/13/2019), GERD (gastroesophageal reflux disease), H/O: gout, Hepatic steatosis (10/01/11), History of kidney stones, Hyperlipidemia, Hypertension, Kidney tumor, OSA on CPAP, Parkinson disease ([place]) (01/04/2016), REM sleep behavior disorder (12/06/2016), Right ureteral stone, Ureteral calculus, right (06/12/2011), Vitamin D deficiency, and Weakness. here with:  1.  Parkinson's disease  Continue Mirapex 1 mg 3 times a day Continue Sinemet 25-250 milligrams 1 tablet 3 times a day Encouraged patient to stay well-hydrated and active Advised if symptoms worsen or he develops new symptoms he should let us know Follow-up in 6 months or sooner if needed     Ward Givens, MSN, NP-C 06/07/2021, 8:42 AM [facility] [address] [place], [place] [address] [phone]

## 2021-06-07 NOTE — Patient Instructions (Signed)
Your Plan:  Continue Mirapex 1 mg 3 times a day Continue Sinemet 25-250 milligrams 1 tablet 3 times a day Stay well hydrated, active Call for worsening symptoms     Thank you for coming to see [place] at [facility]. I hope we have been able to provide you high quality care today.  You may receive a patient satisfaction survey over the next few weeks. We would appreciate your feedback and comments so that we may continue to improve ourselves and the health of our patients.

## 2021-06-07 NOTE — Progress Notes (Deleted)
PATIENT: Todd Rice DOB: 1956/04/18  REASON FOR VISIT: follow up HISTORY FROM: patient PRIMARY NEUROLOGIST:   HISTORY OF PRESENT ILLNESS: Today 06/07/21  HISTORY   REVIEW OF SYSTEMS: Out of a complete 14 system review of symptoms, the patient complains only of the following symptoms, and all other reviewed systems are negative.  FSS ESS  ALLERGIES: Allergies  Allergen Reactions   Ciprofloxacin Hives   Ceftriaxone Hives    HOME MEDICATIONS: Outpatient Medications Prior to Visit  Medication Sig Dispense Refill   albuterol (VENTOLIN HFA) 108 (90 Base) MCG/ACT inhaler INHALE 2 PUFFS EVERY 4 HOURS (15 MINUTES APART) FOR ASTHMA RESCUE 25.5 g 0   allopurinol (ZYLOPRIM) 300 MG tablet TAKE 1 TABLET BY MOUTH ONCE A DAY TO TO PREVENT GOUT 90 tablet 3   ALPRAZolam (XANAX) 0.5 MG tablet Take 1 tab up to three times a day only if needed for severe anxiety or panic attack. Avoid daily use. 90 tablet 0   aspirin EC 81 MG tablet Take 81 mg by mouth at bedtime.     carbidopa-levodopa (SINEMET IR) 25-250 MG tablet TAKE 1 TABLET BY MOUTH 3 TIMES DAILY 270 tablet 3   Dulaglutide (TRULICITY) 2.58 NI/7.7OE SOPN Inject 0.75 mg into the skin once a week. 1 mL 0   ezetimibe (ZETIA) 10 MG tablet Take  1 tablet  Daily  for Cholesterol 90 tablet 3   glipiZIDE (GLUCOTROL) 5 MG tablet Take 1 tablet (5 mg total) by mouth 2 (two) times daily before a meal. 60 tablet 2   Magnesium 250 MG TABS Take 1 tablet by mouth daily.     metFORMIN (GLUCOPHAGE-XR) 500 MG 24 hr tablet TAKE 2 TABLETS BY MOUTH 2 TIMES DAILY 360 tablet 3   mometasone (NASONEX) 50 MCG/ACT nasal spray PLACE 2 SPRAYS INTO THE NOSE DAILY. (Patient not taking: Reported on 03/30/2021) 17 g 12   polyethylene glycol powder (GLYCOLAX/MIRALAX) 17 GM/SCOOP powder Take 1 Container by mouth daily.     pramipexole (MIRAPEX) 1 MG tablet Take 1 tablet (1 mg total) by mouth 3 (three) times daily. 270 tablet 3   rosuvastatin (CRESTOR) 40 MG tablet  Take 1 tablet (40 mg total) by mouth daily. 90 tablet 1   senna (SENOKOT) 8.6 MG tablet Take 1 tablet by mouth daily.     valsartan-hydrochlorothiazide (DIOVAN-HCT) 320-25 MG tablet TAKE 1 TABLET BY MOUTH ONCE A DAY FOR BLOOD PRESSURE 90 tablet 1   vitamin C (ASCORBIC ACID) 500 MG tablet Take 1 tablet (500 mg total) by mouth 3 (three) times daily. 21 tablet 0   VITAMIN D PO Take 5,000 Units by mouth 2 (two) times daily.     zolpidem (AMBIEN) 5 MG tablet Take 1 tablet (5 mg total) by mouth at bedtime as needed for sleep. 30 tablet 0   No facility-administered medications prior to visit.    PAST MEDICAL HISTORY: Past Medical History:  Diagnosis Date   Adult BMI 50.0-59.9 kg/sq m    52.77   Anal fissure    Anxiety    Diabetes mellitus without complication (HCC)    borderline   Elevated PSA 08/13/2019   GERD (gastroesophageal reflux disease)    30 years ago, maybe from peptic ulcer   H/O: gout    STABLE   Hepatic steatosis 10/01/11   History of kidney stones    Hyperlipidemia    Hypertension    Kidney tumor    right upper kidney tumor, monitoring   OSA on CPAP  [employer] [facility]   Parkinson disease ([place]) 01/04/2016   REM sleep behavior disorder 12/06/2016   Right ureteral stone    Ureteral calculus, right 06/12/2011   Vitamin D deficiency    Weakness     PAST SURGICAL HISTORY: Past Surgical History:  Procedure Laterality Date   ANAL FISSURE REPAIR  10-12-2000   COLONOSCOPY N/A 07/01/2012   Procedure: COLONOSCOPY;  Surgeon: Lafayette Dragon, MD;  [facility];  Service: Endoscopy;  Laterality: N/A;   CYSTOSCOPY WITH RETROGRADE PYELOGRAM, URETEROSCOPY AND STENT PLACEMENT Bilateral 02/26/2013   Procedure: CYSTOSCOPY WITH RETROGRADE PYELOGRAM, URETEROSCOPY AND STENT PLACEMENT;  Surgeon: Alexis Frock, MD;  Location[facility];  Service: Urology;  Laterality: Bilateral;   HOLMIUM LASER APPLICATION Bilateral 57/84/6962   Procedure: HOLMIUM LASER APPLICATION;  Surgeon: Alexis Frock,  MD;  Location: [facility];  Service: Urology;  Laterality: Bilateral;   kidney stone removal     06/2011-stent placement   LEFT MEDIAL FEMORAL CONDYLE DEBRIDEMENT & DRILLING/ REMOVAL LOOSE BODY  09-15-2003   NASAL SEPTUM SURGERY  1985   STONE EXTRACTION WITH BASKET  06/12/2011   Procedure: STONE EXTRACTION WITH BASKET;  Surgeon: Claybon Jabs, MD;  [facility];  Service: Urology;  Laterality: Right;    FAMILY HISTORY: Family History  Problem Relation Age of Onset   Heart disease Mother        Atrial fibrillation   Hypertension Mother    Hyperlipidemia Mother    Lung cancer Father        was a smoker   Cancer Father        lung   Hyperlipidemia Brother    Melanoma Brother    Heart disease Brother        Amyloid   Heart disease Paternal Grandmother    Hyperlipidemia Paternal Grandmother    Heart disease Maternal Aunt    Hyperlipidemia Maternal Aunt    Hypertension Maternal Aunt    Stroke Maternal Aunt     SOCIAL HISTORY: Social History   Socioeconomic History   Marital status: Married    Spouse name: Dawn   Number of children: 0   Years of education: Not on file   Highest education level: Not on file  Occupational History    Comment: retired  Tobacco Use   Smoking status: Former   Smokeless tobacco: Never  Scientific laboratory technician Use: Never used  Substance and Sexual Activity   Alcohol use: No   Drug use: No    Comment: QUIT SMOKING " POT" 31 YRS AGO   Sexual activity: Yes    Partners: Female    Birth control/protection: Post-menopausal  Other Topics Concern   Not on file  Social History Narrative   Right-handed.   No caffeine use.   Lives at home with his wife.   Social Determinants of Health   Financial Resource Strain: Not on file  Food Insecurity: Not on file  Transportation Needs: Not on file  Physical Activity: Not on file  Stress: Not on file  Social Connections: Not on file  Intimate Partner Violence: Not on file       PHYSICAL EXAM  There were no vitals filed for this visit. There is no height or weight on file to calculate BMI.  Generalized: Well developed, in no acute distress  Chest: Lungs clear to auscultation bilaterally  Neurological examination  Mentation: Alert oriented to time, place, history taking. Follows all commands speech and language fluent Cranial nerve II-XII: Extraocular  movements were full, visual field were full on confrontational test Head turning and shoulder shrug  were normal and symmetric. Motor: The motor testing reveals 5 over 5 strength of all 4 extremities. Good symmetric motor tone is noted throughout.  Sensory: Sensory testing is intact to soft touch on all 4 extremities. No evidence of extinction is noted.  Gait and station: Gait is normal.    DIAGNOSTIC DATA (LABS, IMAGING, TESTING) - I reviewed patient records, labs, notes, testing and imaging myself where available.  Lab Results  Component Value Date   WBC 10.0 03/24/2021   HGB 15.5 03/24/2021   HCT 45.5 03/24/2021   MCV 84.7 03/24/2021   PLT 201 03/24/2021      Component Value Date/Time   NA 138 03/24/2021 1636   K 4.2 03/24/2021 1636   CL 101 03/24/2021 1636   CO2 25 03/24/2021 1636   GLUCOSE 105 (H) 03/24/2021 1636   BUN 20 03/24/2021 1636   CREATININE 1.22 03/24/2021 1636   CALCIUM 10.1 03/24/2021 1636   PROT 7.3 03/24/2021 1636   ALBUMIN 3.8 10/24/2016 1601   AST 18 03/24/2021 1636   ALT 16 03/24/2021 1636   ALKPHOS 58 10/24/2016 1601   BILITOT 0.4 03/24/2021 1636   GFRNONAA 58 (L) 09/27/2020 1008   GFRAA 68 09/27/2020 1008   Lab Results  Component Value Date   CHOL 183 03/24/2021   HDL 31 (L) 03/24/2021   LDLCALC 105 (H) 03/24/2021   TRIG 353 (H) 03/24/2021   CHOLHDL 5.9 (H) 03/24/2021   Lab Results  Component Value Date   HGBA1C 6.9 (H) 03/24/2021   Lab Results  Component Value Date   [id number] 832 09/26/2018   Lab Results  Component Value Date   TSH 2.65  03/24/2021      ASSESSMENT AND PLAN 66 y.o. year old male  has a past medical history of Adult BMI 50.0-59.9 kg/sq m, Anal fissure, Anxiety, Diabetes mellitus without complication ([place]), Elevated PSA (08/13/2019), GERD (gastroesophageal reflux disease), H/O: gout, Hepatic steatosis (10/01/11), History of kidney stones, Hyperlipidemia, Hypertension, Kidney tumor, OSA on CPAP, Parkinson disease ([place]) (01/04/2016), REM sleep behavior disorder (12/06/2016), Right ureteral stone, Ureteral calculus, right (06/12/2011), Vitamin D deficiency, and Weakness. here with:  OSA on CPAP  - CPAP compliance excellent - Good treatment of AHI  - Encourage patient to use CPAP nightly and > 4 hours each night - F/U in 1 year or sooner if needed   I spent *** minutes of face-to-face and non-face-to-face time with patient.  This included previsit chart review, lab review, study review, order entry, electronic health record documentation, patient education.  Ward Givens, MSN, NP-C 06/07/2021, 8:29 AM [facility] [address] [place], [place] [address] [phone]

## 2021-06-11 DIAGNOSIS — G4733 Obstructive sleep apnea (adult) (pediatric): Secondary | ICD-10-CM | POA: Diagnosis not present

## 2021-06-16 ENCOUNTER — Telehealth: Payer: Self-pay | Admitting: Physical Therapy

## 2021-06-16 DIAGNOSIS — Z0289 Encounter for other administrative examinations: Secondary | ICD-10-CM

## 2021-06-16 DIAGNOSIS — G20 Parkinson's disease: Secondary | ICD-10-CM

## 2021-06-16 NOTE — Telephone Encounter (Deleted)
Susie Pousson is scheduled for return PT and speech evaluations as agreed upon at his previous discharge.  Could you please send order for PT and speech evaluations via Epic?  Thank you.  Modena Nunnery, PT 06/16/21 1:27 PM Phone: [phone] Fax: [phone]

## 2021-06-16 NOTE — Telephone Encounter (Signed)
J[facility]en [facility]) Mcgrory is scheduled for return PT and speech therapy evaluations, as agreed upon at his previous therapy discharge.  Please send orders via Epic for PT and speech therapy eval and treat.  Thank you.  Mady Haagensen, PT 06/16/21 1:48 PM Phone: [phone] Fax: [phone]

## 2021-06-16 NOTE — Telephone Encounter (Signed)
Please disregard.  Note entered in error.  Mady Haagensen, PT 06/16/21 1:44 PM Phone: [phone] Fax: [phone]

## 2021-06-16 NOTE — Telephone Encounter (Signed)
Orders placed.

## 2021-06-23 ENCOUNTER — Other Ambulatory Visit: Payer: Self-pay

## 2021-06-23 ENCOUNTER — Ambulatory Visit (INDEPENDENT_AMBULATORY_CARE_PROVIDER_SITE_OTHER): Payer: Medicare Other | Admitting: Adult Health

## 2021-06-23 ENCOUNTER — Other Ambulatory Visit (HOSPITAL_BASED_OUTPATIENT_CLINIC_OR_DEPARTMENT_OTHER): Payer: Self-pay

## 2021-06-23 ENCOUNTER — Encounter: Payer: Self-pay | Admitting: Adult Health

## 2021-06-23 VITALS — BP 126/76 | HR 67 | Temp 97.5°F | Wt 263.0 lb

## 2021-06-23 DIAGNOSIS — I10 Essential (primary) hypertension: Secondary | ICD-10-CM

## 2021-06-23 DIAGNOSIS — G20 Parkinson's disease: Secondary | ICD-10-CM | POA: Diagnosis not present

## 2021-06-23 DIAGNOSIS — E1169 Type 2 diabetes mellitus with other specified complication: Secondary | ICD-10-CM

## 2021-06-23 DIAGNOSIS — E785 Hyperlipidemia, unspecified: Secondary | ICD-10-CM | POA: Diagnosis not present

## 2021-06-23 DIAGNOSIS — N1831 Chronic kidney disease, stage 3a: Secondary | ICD-10-CM

## 2021-06-23 DIAGNOSIS — I700 Atherosclerosis of aorta: Secondary | ICD-10-CM | POA: Diagnosis not present

## 2021-06-23 DIAGNOSIS — N183 Chronic kidney disease, stage 3 unspecified: Secondary | ICD-10-CM | POA: Diagnosis not present

## 2021-06-23 DIAGNOSIS — E1122 Type 2 diabetes mellitus with diabetic chronic kidney disease: Secondary | ICD-10-CM | POA: Diagnosis not present

## 2021-06-23 DIAGNOSIS — Z79899 Other long term (current) drug therapy: Secondary | ICD-10-CM

## 2021-06-23 DIAGNOSIS — I2510 Atherosclerotic heart disease of native coronary artery without angina pectoris: Secondary | ICD-10-CM

## 2021-06-23 DIAGNOSIS — G4733 Obstructive sleep apnea (adult) (pediatric): Secondary | ICD-10-CM | POA: Diagnosis not present

## 2021-06-23 DIAGNOSIS — Z6841 Body Mass Index (BMI) 40.0 and over, adult: Secondary | ICD-10-CM

## 2021-06-23 DIAGNOSIS — F325 Major depressive disorder, single episode, in full remission: Secondary | ICD-10-CM

## 2021-06-23 MED ORDER — OZEMPIC (0.25 OR 0.5 MG/DOSE) 2 MG/1.5ML ~~LOC~~ SOPN: PEN_INJECTOR | SUBCUTANEOUS | 0 refills | Status: DC

## 2021-06-23 NOTE — Progress Notes (Signed)
3 MONTH FOLLOW UP  Assessment and Plan:   Atherosclerosis of aorta (HCC) - per CXR 06/2018 Control blood pressure, cholesterol, glucose, increase exercise.   Coronary artery disease involving native coronary artery of native heart without angina pectoris Control blood pressure, cholesterol, glucose, increase exercise.  Go to the ER if any chest pain, shortness of breath, nausea, dizziness, severe HA, changes vision/speech  Essential hypertension - continue medications, DASH diet, exercise and monitor at home. Call if greater than 130/80.   Controlled type 2 diabetes mellitus with diabetic dermatitis, without long-term current use of insulin ([place]) Discussed general issues about diabetes pathophysiology and management., Educational material distributed., Suggested low cholesterol diet., Encouraged aerobic exercise., Discussed foot care., Reminded to get yearly retinal exam.  Off of januvia, uses glipizide PRN with elevations only Discussed possible weight and CVD benefit with GLP1-ra; Todd Rice would like trial; ozempic sample pen given with taper instructions, first injection given in office and tolerated well; caution possible constipation Given financial assistance paperwork to complete at home if needed Check A1C q82m.   Hyperlipidemia, unspecified hyperlipidemia type -taking rosuvastatin as tolerated - some GI discomfort - tolerating zetia daily  - try taking rosuvastatin 20 mg or 40 mg twice weekly consistently, contact back with max tolerated dose and will update script.  check lipids, decrease fatty foods, increase activity.   Class 3 obesity due to excess calories with serious comorbidity and body mass index (BMI) of 40.0 to 44.9 in adult [name])  - long discussion about weight loss, diet, and exercise - trying ozempic -     Lipid panel -     Hemoglobin A1c  Parkinson disease (HCC) Continue neuro Dr. [name] follow up and meds PT routinely; doing exercises at home; Dr. [name] orders as  needed  Vitamin D deficiency Continue supplement  -     VITAMIN D 25 Hydroxy (Vit-D Deficiency, Fractures)  Asthma, mild intermittent, well-controlled Weight loss, PRN albuterol, avoid triggers  Right renal cell carcinoma ([place]) Dr. Tresa Moore following, active monitoring  OSA treated with BiPAP Dr. Brett Fairy following- continue BiPAP, helping with daytime fatigue, weight loss still advised.   Major depression in remission ([place]) In remission off of medication; monitor   Constipation Increase sennakot to daily, increase fiber in diet, continue exercise Increase miralax if firm stools Monitor closely with addition of GLP-1RA, discussed risks with Rice   Discussed med's effects and SE's. Screening labs and tests as requested with regular follow-up as recommended.  Future Appointments  Date Time Provider [place]  07/11/2021  8:00 AM Frazier Butt, PT [facility]  07/11/2021  8:45 AM Sharen Counter, CCC-SLP [facility]  07/19/2021  9:00 AM Newton Pigg, RPH [facility] None  08/04/2021 11:30 AM Ward Givens, NP [facility] None  08/30/2021  7:15 AM Delton Prairie, OT [facility]  09/20/2021  4:00 PM [name], NP [facility] None  03/28/2022  3:00 PM [name], NP [facility] None     HPI 66 y.o. Todd Rice presents for 3MOV for diabetes and complications.   Accompanied by his wife. Denies new concerns today.  Todd Rice has new upper dental implants, doing better with eating. No issues.   Todd Rice has depression in remission off of wellbutrin. Todd Rice has xanax that Todd Rice uses occasionally, typically 2-3 tabs per month. Rare use of ambien for sleep (prescribed by Dr. Brett Fairy).   Todd Rice is on BiPAP for OSA per Dr. Brett Fairy, endorses 100% compliance and restorative sleep.   Also has R renal papillary carcinoma, Follows  annually with Dr. Berneice Heinrich, has been stable for several years. Todd Rice has hx of chronic pseudomonas bladder colonization, recently est with Dr. Daiva Eves so if  recurrent UTI sx planning on IV abx.   Follows with neurology Dr. Anne Hahn for Parkinson's (will have new provider Todd Rice is retiring). Todd Rice reports that Todd Rice has been taking the mirapex and sinemet and feels fairly controlled. Does continue to have significant L hand tremor. Restarting PT/OT again soon in March 2023. Todd Rice is on miralax q3d and senokot once a day for constipation.  Has mild asthma, has albuterol uses q2-3 days PRN if needed when outdoors and ragweed season.    BMI is Body mass index is 41.19 kg/m., Todd Rice has been working on diet and exercise, has joined the gym, going 3-4 days/week. Admits some good/bad with diet. Has been pushing water, 4-7 bottles.  Wt Readings from Last 3 Encounters:  06/23/21 263 lb (119.3 kg)  06/07/21 262 lb 6.4 oz (119 kg)  03/24/21 262 lb 12.8 oz (119.2 kg)   Aortic atherosclerosis per CXR 2020. Normal stress test 2013 by Dr. Antoine Poche due to CAD per CT.  His blood pressure has been controlled at home, today their BP is BP: 126/76  Todd Rice does workout. Todd Rice denies chest pain, shortness of breath, dizziness.  Todd Rice is on cholesterol medication (rosuvastatin 40 mg taking every 2-3 days due to GI discomfort, does tolerate zetia 10 mg daily) and denies myalgias. His cholesterol is not at goal of 70 or less. Todd Rice reports has cut back on fried foods. The cholesterol last visit was:   Lab Results  Component Value Date   CHOL 183 03/24/2021   HDL 31 (L) 03/24/2021   LDLCALC 105 (H) 03/24/2021   TRIG 353 (H) 03/24/2021   CHOLHDL 5.9 (H) 03/24/2021   Todd Rice has been working on diet and exercise for diabetes CKD, Todd Rice is on ARB Hyperlipemia on crestor Aortic atherosclerosis Todd Rice is on bASA Todd Rice is on metformin 4 a day  Todd Rice was given trulicity sample but accidentally threw out Only takes glipizide if his sugar is 150+ in AM takes 1/2, 200+ takes whole if 200+, took once after super bowl His sugars have been running 91-150s, average 130s Has hypoglycemic awareness Eye Exam 2022, Dr.  Joseph Art, has upcoming schedule  denies foot ulcerations, hyperglycemia, hypoglycemia , increased appetite, nausea, paresthesia of the feet, polydipsia, polyuria, visual disturbances, vomiting and weight loss.  Last A1C in the office was:  Lab Results  Component Value Date   HGBA1C 6.9 (H) 03/24/2021   Todd Rice has CKD ///III associated with T2DM monitored at this office, on ARB:  Lab Results  Component Value Date   EGFR 65 03/24/2021   Rice is on Vitamin D supplement.   Lab Results  Component Value Date   VD25OH 64 03/24/2021   Gout controlled on allopurinol 300 mg daily without recent flare.  Lab Results  Component Value Date   LABURIC 5.7 03/24/2021     Current Medications:   Current Outpatient Medications (Endocrine & Metabolic):    glipiZIDE (GLUCOTROL) 5 MG tablet, Take 1 tablet (5 mg total) by mouth 2 (two) times daily before a meal.   metFORMIN (GLUCOPHAGE-XR) 500 MG 24 hr tablet, TAKE 2 TABLETS BY MOUTH 2 TIMES DAILY   Dulaglutide (TRULICITY) 0.75 MG/0.5ML SOPN, Inject 0.75 mg into the skin once a week. (Rice not taking: Reported on 06/23/2021)  Current Outpatient Medications (Cardiovascular):    ezetimibe (ZETIA) 10 MG tablet, Take  1 tablet  Daily  for Cholesterol   rosuvastatin (CRESTOR) 40 MG tablet, Take 1 tablet (40 mg total) by mouth daily.   valsartan-hydrochlorothiazide (DIOVAN-HCT) 320-25 MG tablet, TAKE 1 TABLET BY MOUTH ONCE A DAY FOR BLOOD PRESSURE  Current Outpatient Medications (Respiratory):    albuterol (VENTOLIN HFA) 108 (90 Base) MCG/ACT inhaler, INHALE 2 PUFFS EVERY 4 HOURS (15 MINUTES APART) FOR ASTHMA RESCUE   mometasone (NASONEX) 50 MCG/ACT nasal spray, PLACE 2 SPRAYS INTO THE NOSE DAILY. (Rice taking differently: as needed.)  Current Outpatient Medications (Analgesics):    allopurinol (ZYLOPRIM) 300 MG tablet, TAKE 1 TABLET BY MOUTH ONCE A DAY TO TO PREVENT GOUT   aspirin EC 81 MG tablet, Take 81 mg by mouth at bedtime.   Current Outpatient  Medications (Other):    ALPRAZolam (XANAX) 0.5 MG tablet, Take 1 tab up to three times a day only if needed for severe anxiety or panic attack. Avoid daily use. (Rice taking differently: as needed.)   carbidopa-levodopa (SINEMET IR) 25-250 MG tablet, TAKE 1 TABLET BY MOUTH 3 TIMES DAILY   Magnesium 250 MG TABS, Take 1 tablet by mouth daily.   polyethylene glycol powder (GLYCOLAX/MIRALAX) 17 GM/SCOOP powder, Take 1 Container by mouth daily.   pramipexole (MIRAPEX) 1 MG tablet, Take 1 tablet (1 mg total) by mouth 3 (three) times daily.   senna (SENOKOT) 8.6 MG tablet, Take 1 tablet by mouth daily.   vitamin C (ASCORBIC ACID) 500 MG tablet, Take 1 tablet (500 mg total) by mouth 3 (three) times daily.   VITAMIN D PO, Take 5,000 Units by mouth 2 (two) times daily.   zolpidem (AMBIEN) 5 MG tablet, Take 1 tablet (5 mg total) by mouth at bedtime as needed for sleep.   Medical History:  Past Medical History:  Diagnosis Date   Adult BMI 50.0-59.9 kg/sq m    52.77   Anal fissure    Anxiety    Diabetes mellitus without complication (HCC)    borderline   Elevated PSA 08/13/2019   GERD (gastroesophageal reflux disease)    30 years ago, maybe from peptic ulcer   H/O: gout    STABLE   Hepatic steatosis 10/01/11   History of kidney stones    Hyperlipidemia    Hypertension    Kidney tumor    right upper kidney tumor, monitoring   OSA on CPAP    [employer] [facility]   Parkinson disease ([place]) 01/04/2016   REM sleep behavior disorder 12/06/2016   Right ureteral stone    Ureteral calculus, right 06/12/2011   Vitamin D deficiency    Weakness    Allergies Allergies  Allergen Reactions   Ciprofloxacin Hives   Ceftriaxone Hives    SURGICAL HISTORY Todd Rice  has a past surgical history that includes Anal fissure repair (10-12-2000); Nasal septum surgery (1985); LEFT MEDIAL FEMORAL CONDYLE DEBRIDEMENT & DRILLING/ REMOVAL LOOSE BODY (09-15-2003); Stone extraction with basket (06/12/2011); kidney stone removal;  Colonoscopy (N/A, 07/01/2012); Cystoscopy with retrograde pyelogram, ureteroscopy and stent placement (Bilateral, 02/26/2013); and Holmium laser application (Bilateral, 02/26/2013). FAMILY HISTORY His family history includes Cancer in his father; Heart disease in his brother, maternal aunt, mother, and paternal grandmother; Hyperlipidemia in his brother, maternal aunt, mother, and paternal grandmother; Hypertension in his maternal aunt and mother; Lung cancer in his father; Melanoma in his brother; Stroke in his maternal aunt. SOCIAL HISTORY Todd Rice  reports that Todd Rice has quit smoking. Todd Rice has never used smokeless tobacco. Todd Rice reports that Todd Rice does not drink alcohol and does not  use drugs.   Review of Systems:  Review of Systems  Constitutional:  Negative for malaise/fatigue and weight loss.  HENT:  Negative for hearing loss and tinnitus.   Eyes:  Negative for blurred vision and double vision.  Respiratory:  Negative for cough, shortness of breath and wheezing.   Cardiovascular:  Negative for chest pain, palpitations, orthopnea, claudication and leg swelling.  Gastrointestinal:  Negative for abdominal pain, blood in stool, constipation (controlled on meds), diarrhea, heartburn, melena, nausea and vomiting.  Genitourinary: Negative.   Musculoskeletal:  Negative for joint pain and myalgias.  Skin:  Negative for rash.  Neurological:  Positive for tremors (L hand). Negative for dizziness, tingling, sensory change, weakness and headaches.  Endo/Heme/Allergies:  Negative for polydipsia.  Psychiatric/Behavioral:  Negative for depression and memory loss. The Rice has insomnia (occasionally ). The Rice is not nervous/anxious ([place]).   All other systems reviewed and are negative.  Physical Exam: Estimated body mass index is 41.19 kg/m as calculated from the following:   Height as of 06/07/21: $RemoveBef'5\' 7"'ppehCaJBtW$  (1.702 m).   Weight as of this encounter: 263 lb (119.3 kg). BP 126/76    Pulse 67    Temp (!) 97.5 F  (36.4 C)    Wt 263 lb (119.3 kg)    SpO2 99%    BMI 41.19 kg/m   General Appearance: Well nourished, well dressed morbidly obese Todd in no apparent distress.  Eyes: PERRLA, EOMs, conjunctiva no swelling or erythema ENT/Mouth: Ear canals clear bilaterally with no erythema, swelling, discharge.  TMs normal bilaterally with no erythema, bulging, or retractions.  Oropharynx clear and moist with no exudate, swelling, or erythema.   Neck: Supple, thyroid normal. No bruits, JVD, cervical adenopathy Respiratory: Respiratory effort normal, BS equal bilaterally without rales, rhonchi, wheezing or stridor.  Cardio: RRR without murmurs, rubs or gallops. Brisk peripheral pulses without edema.  Chest: symmetric, with normal excursions Abdomen: Soft, morbidly obese limiting exam, nontender, no guarding, rebound, hernias, masses, or organomegaly. Genitourinary: deferred today  Musculoskeletal: Full ROM all peripheral extremities,5/5 strength, and normal gait.  Skin: Warm, dry without rashes, lesions, ecchymosis. Neuro: A&Ox3, Cranial nerves intact, slow gait.  Tremor of the left hand intermittently at rest.  Sensation intact.  Psych: Flat affect, Insight and Judgment appropriate.    Izora Ribas, NP-C 4:02 PM [facility]

## 2021-06-23 NOTE — Patient Instructions (Signed)
Semaglutide Injection °What is this medication? °SEMAGLUTIDE (SEM a GLOO tide) treats type 2 diabetes. It works by increasing insulin levels in your body, which decreases your blood sugar (glucose). It also reduces the amount of sugar released into the blood and slows down your digestion. It can also be used to lower the risk of heart attack and stroke in people with type 2 diabetes. Changes to diet and exercise are often combined with this medication. °This medicine may be used for other purposes; ask your health care provider or pharmacist if you have questions. °COMMON BRAND NAME(S): OZEMPIC °What should I tell my care team before I take this medication? °They need to know if you have any of these conditions: °Endocrine tumors (MEN 2) or if someone in your family had these tumors °Eye disease, vision problems °History of pancreatitis °Kidney disease °Stomach problems °Thyroid cancer or if someone in your family had thyroid cancer °An unusual or allergic reaction to semaglutide, other medications, foods, dyes, or preservatives °Pregnant or trying to get pregnant °Breast-feeding °How should I use this medication? °This medication is for injection under the skin of your upper leg (thigh), stomach area, or upper arm. It is given once every week (every 7 days). You will be taught how to prepare and give this medication. Use exactly as directed. Take your medication at regular intervals. Do not take it more often than directed. °If you use this medication with insulin, you should inject this medication and the insulin separately. Do not mix them together. Do not give the injections right next to each other. Change (rotate) injection sites with each injection. °It is important that you put your used needles and syringes in a special sharps container. Do not put them in a trash can. If you do not have a sharps container, call your pharmacist or care team to get one. °A special MedGuide will be given to you by the  pharmacist with each prescription and refill. Be sure to read this information carefully each time. °This medication comes with INSTRUCTIONS FOR USE. Ask your pharmacist for directions on how to use this medication. Read the information carefully. Talk to your pharmacist or care team if you have questions. °Talk to your care team about the use of this medication in children. Special care may be needed. °Overdosage: If you think you have taken too much of this medicine contact a poison control center or emergency room at once. °NOTE: This medicine is only for you. Do not share this medicine with others. °What if I miss a dose? °If you miss a dose, take it as soon as you can within 5 days after the missed dose. Then take your next dose at your regular weekly time. If it has been longer than 5 days after the missed dose, do not take the missed dose. Take the next dose at your regular time. Do not take double or extra doses. If you have questions about a missed dose, contact your care team for advice. °What may interact with this medication? °Other medications for diabetes °Many medications may cause changes in blood sugar, these include: °Alcohol containing beverages °Antiviral medications for HIV or AIDS °Aspirin and aspirin-like medications °Certain medications for blood pressure, heart disease, irregular heart beat °Chromium °Diuretics °Male hormones, such as estrogens or progestins, birth control pills °Fenofibrate °Gemfibrozil °Isoniazid °Lanreotide °Male hormones or anabolic steroids °MAOIs like Carbex, Eldepryl, Marplan, Nardil, and Parnate °Medications for weight loss °Medications for allergies, asthma, cold, or cough °Medications for depression,   anxiety, or psychotic disturbances °Niacin °Nicotine °NSAIDs, medications for pain and inflammation, like ibuprofen or naproxen °Octreotide °Pasireotide °Pentamidine °Phenytoin °Probenecid °Quinolone antibiotics such as ciprofloxacin, levofloxacin, ofloxacin °Some  herbal dietary supplements °Steroid medications such as prednisone or cortisone °Sulfamethoxazole; trimethoprim °Thyroid hormones °Some medications can hide the warning symptoms of low blood sugar (hypoglycemia). You may need to monitor your blood sugar more closely if you are taking one of these medications. These include: °Beta-blockers, often used for high blood pressure or heart problems (examples include atenolol, metoprolol, propranolol) °Clonidine °Guanethidine °Reserpine °This list may not describe all possible interactions. Give your health care provider a list of all the medicines, herbs, non-prescription drugs, or dietary supplements you use. Also tell them if you smoke, drink alcohol, or use illegal drugs. Some items may interact with your medicine. °What should I watch for while using this medication? °Visit your care team for regular checks on your progress. °Drink plenty of fluids while taking this medication. Check with your care team if you get an attack of severe diarrhea, nausea, and vomiting. The loss of too much body fluid can make it dangerous for you to take this medication. °A test called the HbA1C (A1C) will be monitored. This is a simple blood test. It measures your blood sugar control over the last 2 to 3 months. You will receive this test every 3 to 6 months. °Learn how to check your blood sugar. Learn the symptoms of low and high blood sugar and how to manage them. °Always carry a quick-source of sugar with you in case you have symptoms of low blood sugar. Examples include hard sugar candy or glucose tablets. Make sure others know that you can choke if you eat or drink when you develop serious symptoms of low blood sugar, such as seizures or unconsciousness. They must get medical help at once. °Tell your care team if you have high blood sugar. You might need to change the dose of your medication. If you are sick or exercising more than usual, you might need to change the dose of your  medication. °Do not skip meals. Ask your care team if you should avoid alcohol. Many nonprescription cough and cold products contain sugar or alcohol. These can affect blood sugar. °Pens should never be shared. Even if the needle is changed, sharing may result in passing of viruses like hepatitis or HIV. °Wear a medical ID bracelet or chain, and carry a card that describes your disease and details of your medication and dosage times. °Do not become pregnant while taking this medication. Women should inform their care team if they wish to become pregnant or think they might be pregnant. There is a potential for serious side effects to an unborn child. Talk to your care team for more information. °What side effects may I notice from receiving this medication? °Side effects that you should report to your care team as soon as possible: °Allergic reactions--skin rash, itching, hives, swelling of the face, lips, tongue, or throat °Change in vision °Dehydration--increased thirst, dry mouth, feeling faint or lightheaded, headache, dark yellow or brown urine °Gallbladder problems--severe stomach pain, nausea, vomiting, fever °Heart palpitations--rapid, pounding, or irregular heartbeat °Kidney injury--decrease in the amount of urine, swelling of the ankles, hands, or feet °Pancreatitis--severe stomach pain that spreads to your back or gets worse after eating or when touched, fever, nausea, vomiting °Thyroid cancer--new mass or lump in the neck, pain or trouble swallowing, trouble breathing, hoarseness °Side effects that usually do not require medical   attention (report to your care team if they continue or are bothersome): °Diarrhea °Loss of appetite °Nausea °Stomach pain °Vomiting °This list may not describe all possible side effects. Call your doctor for medical advice about side effects. You may report side effects to FDA at 1-800-FDA-1088. °Where should I keep my medication? °Keep out of the reach of children. °Store  unopened pens in a refrigerator between 2 and 8 degrees C (36 and 46 degrees F). Do not freeze. Protect from light and heat. After you first use the pen, it can be stored for 56 days at room temperature between 15 and 30 degrees C (59 and 86 degrees F) or in a refrigerator. Throw away your used pen after 56 days or after the expiration date, whichever comes first. °Do not store your pen with the needle attached. If the needle is left on, medication may leak from the pen. °NOTE: This sheet is a summary. It may not cover all possible information. If you have questions about this medicine, talk to your doctor, pharmacist, or health care provider. °© 2022 [employer] (2020-07-29 00:00:00) ° °

## 2021-06-24 LAB — CBC WITH DIFFERENTIAL/PLATELET
Absolute Monocytes: 770 cells/uL (ref 200–950)
Basophils Absolute: 40 cells/uL (ref 0–200)
Basophils Relative: 0.4 %
Eosinophils Absolute: 190 cells/uL (ref 15–500)
Eosinophils Relative: 1.9 %
HCT: 46.2 % (ref 38.5–50.0)
Hemoglobin: 15.5 g/dL (ref 13.2–17.1)
Lymphs Abs: 2800 cells/uL (ref 850–3900)
MCH: 28.5 pg (ref 27.0–33.0)
MCHC: 33.5 g/dL (ref 32.0–36.0)
MCV: 84.9 fL (ref 80.0–100.0)
MPV: 11 fL (ref 7.5–12.5)
Monocytes Relative: 7.7 %
Neutro Abs: 6200 cells/uL (ref 1500–7800)
Neutrophils Relative %: 62 %
Platelets: 213 10*3/uL (ref 140–400)
RBC: 5.44 10*6/uL (ref 4.20–5.80)
RDW: 13.6 % (ref 11.0–15.0)
Total Lymphocyte: 28 %
WBC: 10 10*3/uL (ref 3.8–10.8)

## 2021-06-24 LAB — COMPLETE METABOLIC PANEL WITH GFR
AG Ratio: 1.3 (calc) (ref 1.0–2.5)
ALT: 12 U/L (ref 9–46)
AST: 19 U/L (ref 10–35)
Albumin: 4.3 g/dL (ref 3.6–5.1)
Alkaline phosphatase (APISO): 54 U/L (ref 35–144)
BUN: 24 mg/dL (ref 7–25)
CO2: 27 mmol/L (ref 20–32)
Calcium: 10.2 mg/dL (ref 8.6–10.3)
Chloride: 99 mmol/L (ref 98–110)
Creat: 1.25 mg/dL (ref 0.70–1.35)
Globulin: 3.2 g/dL (calc) (ref 1.9–3.7)
Glucose, Bld: 113 mg/dL — ABNORMAL HIGH (ref 65–99)
Potassium: 4 mmol/L (ref 3.5–5.3)
Sodium: 138 mmol/L (ref 135–146)
Total Bilirubin: 0.5 mg/dL (ref 0.2–1.2)
Total Protein: 7.5 g/dL (ref 6.1–8.1)
eGFR: 63 mL/min/{1.73_m2} (ref >=60)

## 2021-06-24 LAB — LIPID PANEL
Cholesterol: 162 mg/dL (ref <200)
HDL: 33 mg/dL — ABNORMAL LOW (ref >=40)
LDL Cholesterol (Calc): 93 mg/dL (calc)
Non-HDL Cholesterol (Calc): 129 mg/dL (calc) (ref <130)
Total CHOL/HDL Ratio: 4.9 (calc) (ref <5.0)
Triglycerides: 275 mg/dL — ABNORMAL HIGH (ref <150)

## 2021-06-24 LAB — TSH: TSH: 3.38 mIU/L (ref 0.40–4.50)

## 2021-06-24 LAB — MAGNESIUM: Magnesium: 1.7 mg/dL (ref 1.5–2.5)

## 2021-06-24 LAB — HEMOGLOBIN A1C
Hgb A1c MFr Bld: 7.3 % of total Hgb — ABNORMAL HIGH (ref <5.7)
Mean Plasma Glucose: 163 mg/dL
eAG (mmol/L): 9 mmol/L

## 2021-07-05 DIAGNOSIS — G4733 Obstructive sleep apnea (adult) (pediatric): Secondary | ICD-10-CM | POA: Diagnosis not present

## 2021-07-09 DIAGNOSIS — G4733 Obstructive sleep apnea (adult) (pediatric): Secondary | ICD-10-CM | POA: Diagnosis not present

## 2021-07-11 ENCOUNTER — Other Ambulatory Visit: Payer: Self-pay

## 2021-07-11 ENCOUNTER — Ambulatory Visit: Payer: Medicare Other

## 2021-07-11 ENCOUNTER — Ambulatory Visit: Payer: Medicare Other | Attending: Emergency Medicine | Admitting: Physical Therapy

## 2021-07-11 ENCOUNTER — Encounter: Payer: Self-pay | Admitting: Physical Therapy

## 2021-07-11 DIAGNOSIS — R1310 Dysphagia, unspecified: Secondary | ICD-10-CM | POA: Insufficient documentation

## 2021-07-11 DIAGNOSIS — R293 Abnormal posture: Secondary | ICD-10-CM | POA: Insufficient documentation

## 2021-07-11 DIAGNOSIS — R471 Dysarthria and anarthria: Secondary | ICD-10-CM

## 2021-07-11 DIAGNOSIS — R29818 Other symptoms and signs involving the nervous system: Secondary | ICD-10-CM | POA: Diagnosis not present

## 2021-07-11 DIAGNOSIS — R2681 Unsteadiness on feet: Secondary | ICD-10-CM | POA: Diagnosis not present

## 2021-07-11 DIAGNOSIS — R2689 Other abnormalities of gait and mobility: Secondary | ICD-10-CM | POA: Insufficient documentation

## 2021-07-11 DIAGNOSIS — G20 Parkinson's disease: Secondary | ICD-10-CM | POA: Insufficient documentation

## 2021-07-11 DIAGNOSIS — R41841 Cognitive communication deficit: Secondary | ICD-10-CM | POA: Insufficient documentation

## 2021-07-11 NOTE — Therapy (Signed)
**Note Todd-Identified via Obfuscation** [facility] [address], [place], [place], [address] Phone: [phone]   Fax:  [phone]  Physical Therapy Evaluation  Patient Details  Name: Todd Rice MRN: [id number] Date of Birth: [birth year] Referring Provider (PT): Penumalli/Millikan   Encounter Date: 07/11/2021   PT End of Session - 07/11/21 1141     Visit Number 1    Number of Visits 10    Date for PT Re-Evaluation 08/12/21    Authorization Type U[employer] Medicare    PT Start Time 0801    PT Stop Time 0845    PT Time Calculation (min) 44 min    Behavior During Therapy [facility] for tasks assessed/performed             Past Medical History:  Diagnosis Date   Adult BMI 50.0-59.9 kg/sq m    52.77   Anal fissure    Anxiety    Diabetes mellitus without complication ([place])    borderline   Elevated PSA 08/13/2019   GERD (gastroesophageal reflux disease)    30 years ago, maybe from peptic ulcer   H/O: gout    STABLE   Hepatic steatosis 10/01/11   History of kidney stones    Hyperlipidemia    Hypertension    Kidney tumor    right upper kidney tumor, monitoring   OSA on CPAP    [employer] [facility]   Parkinson disease ([place]) 01/04/2016   REM sleep behavior disorder 12/06/2016   Right ureteral stone    Ureteral calculus, right 06/12/2011   Vitamin D deficiency    Weakness     Past Surgical History:  Procedure Laterality Date   ANAL FISSURE REPAIR  10-12-2000   COLONOSCOPY N/A 07/01/2012   Procedure: COLONOSCOPY;  Surgeon: Lafayette Dragon, MD;  Location: [facility];  Service: Endoscopy;  Laterality: N/A;   CYSTOSCOPY WITH RETROGRADE PYELOGRAM, URETEROSCOPY AND STENT PLACEMENT Bilateral 02/26/2013   Procedure: CYSTOSCOPY WITH RETROGRADE PYELOGRAM, URETEROSCOPY AND STENT PLACEMENT;  Surgeon: Alexis Frock, MD;  Location: [facility];  Service: Urology;  Laterality: Bilateral;   HOLMIUM LASER APPLICATION Bilateral 16/96/7893   Procedure: HOLMIUM LASER APPLICATION;  Surgeon: Alexis Frock, MD;  Location: [facility];  Service: Urology;  Laterality: Bilateral;   kidney stone removal     06/2011-stent placement   LEFT MEDIAL FEMORAL CONDYLE DEBRIDEMENT & DRILLING/ REMOVAL LOOSE BODY  09-15-2003   NASAL SEPTUM SURGERY  1985   STONE EXTRACTION WITH BASKET  06/12/2011   Procedure: STONE EXTRACTION WITH BASKET;  Surgeon: Claybon Jabs, MD;  Location: [facility];  Service: Urology;  Laterality: Right;    There were no vitals filed for this visit.    Subjective Assessment - 07/11/21 0803     Subjective Reports the tremor in the R hand is worse.  Saw the neurologist last month and she kept the medications the same.  Takes the Sinemet 5:30, 1:30, and 7:30-8.  Sometimes when I stand, I feel I'm leaning forward.  Off balance sometimes with eyes closed.  Working out at [employer] about 2 times per week.    Patient Stated Goals Pt's goal is to get my mind right about my Parkinson's.    Currently in Pain? No/denies                [facility] PT Assessment - 07/11/21 0813       Assessment   Medical Diagnosis Parkinson's Disease    Referring Provider (PT) Penumalli/Millikan    Onset  Date/Surgical Date 06/09/21    Hand Dominance Right      Precautions   Precautions None      Balance Screen   Has the patient fallen in the past 6 months No    Has the patient had a decrease in activity level because of a fear of falling?  No    Is the patient reluctant to leave their home because of a fear of falling?  No      Home Social worker Private residence    Living Arrangements Spouse/significant other    Available Help at Discharge Family    Type of [place] to enter    Entrance Stairs-Number of Steps 1    Entrance Stairs-Rails None    Home Layout Two level;Able to live on main level with bedroom/bathroom    Additional Comments Walks the dog, does household chores      Prior Function   Level of Independence Independent     Leisure working out a [employer] (cardio, some machines) 2x/wk; cleaning rooms upstairs      Cognition   Behaviors Other (comment)   Flat affect     Observation/Other Assessments   Focus on Therapeutic Outcomes (FOTO)  NA      Posture/Postural Control   Posture/Postural Control Postural limitations    Postural Limitations Rounded Shoulders    Posture Comments Resting tremor RUE      Tone   Assessment Location Right Lower Extremity;Left Lower Extremity      ROM / Strength   AROM / PROM / Strength AROM;Strength      AROM   Overall AROM  Within functional limits for tasks performed    Overall AROM Comments BLES      Strength   Overall Strength Within functional limits for tasks performed      Transfers   Transfers Sit to Stand;Stand to Sit    Sit to Stand 6: Modified independent (Device/Increase time);Without upper extremity assist;From chair/3-in-1    Five time sit to stand comments  11.07    Stand to Sit 6: Modified independent (Device/Increase time);Without upper extremity assist;To chair/3-in-1      Ambulation/Gait   Ambulation/Gait Yes    Ambulation/Gait Assistance 6: Modified independent (Device/Increase time)    Assistive device None    Gait Pattern Step-through pattern;Decreased arm swing - right    Ambulation Surface Level;Indoor    Gait velocity 10.87 sec = 3.02 ft/sec      Standardized Balance Assessment   Standardized Balance Assessment Timed Up and Go Test;Mini-BESTest      Mini-BESTest   Sit To Stand Normal: Comes to stand without use of hands and stabilizes independently.    Rise to Toes Normal: Stable for 3 s with maximum height.    Stand on one leg (left) Normal: 20 s.    Stand on one leg (right) Normal: 20 s.    Stand on one leg - lowest score 2    Compensatory Stepping Correction - Forward Moderate: More than one step is required to recover equilibrium    Compensatory Stepping Correction - Backward Normal: Recovers independently with a single, large  step    Compensatory Stepping Correction - Left Lateral Normal: Recovers independently with 1 step (crossover or lateral OK)    Compensatory Stepping Correction - Right Lateral Normal: Recovers independently with 1 step (crossover or lateral OK)    Stepping Corredtion Lateral - lowest score 2    Stance -  Feet together, eyes open, firm surface  Normal: 30s    Stance - Feet together, eyes closed, foam surface  Normal: 30s   increased R and posteiror sway, able to maintain   Incline - Eyes Closed Normal: Stands independently 30s and aligns with gravity    Change in Gait Speed Normal: Significantly changes walkling speed without imbalance    Walk with head turns - Horizontal Moderate: performs head turns with reduction in gait speed.    Walk with pivot turns Normal: Turns with feet close FAST (< 3 steps) with good balance.    Step over obstacles Normal: Able to step over box with minimal change of gait speed and with good balance.    Timed UP & GO with Dual Task Moderate: Dual Task affects either counting OR walking (>10%) when compared to the TUG without Dual Task.    Mini-BEST total score 25      Timed Up and Go Test   Normal TUG (seconds) 10.91    Manual TUG (seconds) 11.12    Cognitive TUG (seconds) 14.22   stopped counting   TUG Comments Scores >10% difference indicate increased fall risk.      Functional Gait  Assessment   Gait assessed  Yes    Gait Level Surface Walks 20 ft in less than 5.5 sec, no assistive devices, good speed, no evidence for imbalance, normal gait pattern, deviates no more than 6 in outside of the 12 in walkway width.   5.35   Change in Gait Speed Able to smoothly change walking speed without loss of balance or gait deviation. Deviate no more than 6 in outside of the 12 in walkway width.    Gait with Horizontal Head Turns Performs head turns smoothly with slight change in gait velocity (eg, minor disruption to smooth gait path), deviates 6-10 in outside 12 in walkway  width, or uses an assistive device.   7.22   Gait with Vertical Head Turns Performs task with slight change in gait velocity (eg, minor disruption to smooth gait path), deviates 6 - 10 in outside 12 in walkway width or uses assistive device   6.53   Gait and Pivot Turn Pivot turns safely within 3 sec and stops quickly with no loss of balance.    Step Over Obstacle Is able to step over one shoe box (4.5 in total height) without changing gait speed. No evidence of imbalance.    Gait with Narrow Base of Support Is able to ambulate for 10 steps heel to toe with no staggering.    Gait with Eyes Closed Walks 20 ft, uses assistive device, slower speed, mild gait deviations, deviates 6-10 in outside 12 in walkway width. Ambulates 20 ft in less than 9 sec but greater than 7 sec.   9.28   Ambulating Backwards Walks 20 ft, no assistive devices, good speed, no evidence for imbalance, normal gait   12.13   Steps Alternating feet, must use rail.    Total Score 25    FGA comment: Scores <22/30 indicate increased fall risk.      RLE Tone   RLE Tone Mild      LLE Tone   LLE Tone Within Functional Limits                        Objective measurements completed on examination: See above findings.                PT Education -  07/11/21 1140     Education Details PT eval results, POC    Person(s) Educated Patient    Methods Explanation    Comprehension Verbalized understanding              PT Short Term Goals - 07/11/21 1149       PT SHORT TERM GOAL #1   Title Pt will be independent with HEP for improved balance and gait.  TARGET 07/29/2021    Time 2    Period Weeks    Status New               PT Long Term Goals - 07/11/21 1150       PT LONG TERM GOAL #1   Title Pt will independent with progression of HEP to address Parkinson's deficits for improved balance and gait.  TARGET 08/12/2021    Time 5    Period Weeks    Status New      PT LONG TERM GOAL #2    Title Pt will perform stnading eyes closed on foam with minimal to no sway for improved balance.    Time 5    Period Weeks    Status New      PT LONG TERM GOAL #3   Title Pt will improve forward push and release to 1 step or less for improved balance recovery.    Time 5    Period Weeks    Status New      PT LONG TERM GOAL #4   Title Pt will improve TUG/TUG cognitive score to less than or equal to 10% difference for improved dual tasking with gait.    Baseline TUG 10.91, cog 14.22    Time 5    Period Weeks    Status New                    Plan - 07/11/21 1142     Clinical Impression Statement Pt is a 66 year old male who presents to OPPT with history of Parkinson's disease, who presents today for return PT eval.  He was last seen for a bout of PT in April 2022.  He returns today with slight decrease in gait velocity, TUG/TUG cognitive scores, and MiniBESTest scores.  While he does not present specifically as a fall risk, he is reporting some noticeable changes in balance with eyes closed and more forward flexed posture upon standing.  He does require >2 steps to recover balance in forward push and release test today.  He presents with decreased balance, decreased timing and coordination of gait.   He would benefit from skilled PT to address the above stated deficits to improve overall functional mobility and independence.    Personal Factors and Comorbidities Comorbidity 3+    Comorbidities See problem list    Examination-Activity Limitations Locomotion Level;Stand    Examination-Participation Restrictions Community Activity   community fitness   Stability/Clinical Decision Making Evolving/Moderate complexity    Clinical Decision Making Moderate    Rehab Potential Good    PT Frequency Other (comment)   1x/wk for 1 week, then 2x/wk for 4 weeks   PT Duration Other (comment)   total POC = 5 weeks   PT Treatment/Interventions ADLs/Self Care Home Management;Gait training;Stair  training;Functional mobility training;Therapeutic activities;Therapeutic exercise;Balance training;Neuromuscular re-education;Patient/family education    PT Next Visit Plan Initiate HEP-PWR! Moves standing, quadruped; balance exercises-compliant surface-EO head motions, EC, step strategy work    Oncologist with Plan of Care  Patient             Patient will benefit from skilled therapeutic intervention in order to improve the following deficits and impairments:  Abnormal gait, Difficulty walking, Decreased balance, Decreased mobility, Postural dysfunction  Visit Diagnosis: Other abnormalities of gait and mobility  Unsteadiness on feet  Abnormal posture  Other symptoms and signs involving the nervous system     Problem List Patient Active Problem List   Diagnosis Date Noted   Constipation 03/24/2021   Gout 03/23/2021   Primary renal papillary carcinoma, right ([place]) 02/18/2020   Hepatic steatosis 02/18/2020   Major depression in remission ([place]) 11/11/2019   Central sleep apnea due to Cheyne-Stokes respiration 08/31/2019   Hyperlipidemia associated with type 2 diabetes mellitus ([place]) 07/09/2019   Aortic atherosclerosis ([place]) by CXR on 06/10/2018 06/10/2018   Type 2 diabetes mellitus with stage 3a chronic kidney disease, without long-term current use of insulin ([place]) 01/21/2018   OSA treated with BiPAP 01/03/2017   REM sleep behavior disorder 12/06/2016   CKD stage 3 due to type 2 diabetes mellitus ([place]) 10/23/2016   Medication management 10/23/2016   Vitamin D deficiency 10/23/2016   Parkinson's disease ([place]) 01/04/2016   Seasonal and perennial allergic rhinitis 06/21/2014   Asthma, mild intermittent, well-controlled 06/21/2014   Pseudomonas aeruginosa colonization 10/08/2012   CAD (coronary artery disease) 11/15/2011   Essential hypertension 11/15/2011   Morbid obesity with BMI of 40.0-44.9, adult ([place]) 11/15/2011    Lyndsie Wallman W., PT 07/11/2021, 11:54  AM  [facility] [address] [place], [place], [address] Phone: [phone]   Fax:  [phone]  Name: Todd Rice MRN: [id number] Date of Birth: 1955-07-04

## 2021-07-11 NOTE — Therapy (Signed)
[facility] [address] [place], [place], [address] Phone: [phone]   Fax:  [phone]  Speech Language Pathology Evaluation  Patient Details  Name: Todd Rice MRN: [id number] Date of Birth: 1955-11-26 Referring Provider (SLP): Seymour Bars, NP   Encounter Date: 07/11/2021   End of Session - 07/11/21 1237     Visit Number 1    Number of Visits 17    Date for SLP Re-Evaluation 09/19/21    SLP Start Time 0850    SLP Stop Time  0930    SLP Time Calculation (min) 40 min    Activity Tolerance Patient tolerated treatment well             Past Medical History:  Diagnosis Date   Adult BMI 50.0-59.9 kg/sq m    52.77   Anal fissure    Anxiety    Diabetes mellitus without complication ([place])    borderline   Elevated PSA 08/13/2019   GERD (gastroesophageal reflux disease)    30 years ago, maybe from peptic ulcer   H/O: gout    STABLE   Hepatic steatosis 10/01/11   History of kidney stones    Hyperlipidemia    Hypertension    Kidney tumor    right upper kidney tumor, monitoring   OSA on CPAP    [employer] [facility]   Parkinson disease ([place]) 01/04/2016   REM sleep behavior disorder 12/06/2016   Right ureteral stone    Ureteral calculus, right 06/12/2011   Vitamin D deficiency    Weakness     Past Surgical History:  Procedure Laterality Date   ANAL FISSURE REPAIR  10-12-2000   COLONOSCOPY N/A 07/01/2012   Procedure: COLONOSCOPY;  Surgeon: Lafayette Dragon, MD;  [facility];  Service: Endoscopy;  Laterality: N/A;   CYSTOSCOPY WITH RETROGRADE PYELOGRAM, URETEROSCOPY AND STENT PLACEMENT Bilateral 02/26/2013   Procedure: CYSTOSCOPY WITH RETROGRADE PYELOGRAM, URETEROSCOPY AND STENT PLACEMENT;  Surgeon: Alexis Frock, MD;  Location: [facility];  Service: Urology;  Laterality: Bilateral;   HOLMIUM LASER APPLICATION Bilateral 42/68/3419   Procedure: HOLMIUM LASER APPLICATION;  Surgeon: Alexis Frock, MD;  Location: [facility];   Service: Urology;  Laterality: Bilateral;   kidney stone removal     06/2011-stent placement   LEFT MEDIAL FEMORAL CONDYLE DEBRIDEMENT & DRILLING/ REMOVAL LOOSE BODY  09-15-2003   NASAL SEPTUM SURGERY  1985   STONE EXTRACTION WITH BASKET  06/12/2011   Procedure: STONE EXTRACTION WITH BASKET;  Surgeon: Claybon Jabs, MD;  Location[facility];  Service: Urology;  Laterality: Right;    There were no vitals filed for this visit.   Subjective Assessment - 07/11/21 0854     Subjective "I can tell - sometimes it's like it's a whisper. I just stop and re-engage my brain."    Currently in Pain? No/denies                SLP Evaluation [facility] - 07/11/21 6222       SLP Visit Information   SLP Received On 07/11/21    Referring Provider (SLP) Seymour Bars, NP    Onset Date Dx 2017    Medical Diagnosis Parkinson's Disease (PD)      Subjective   Subjective Pt also endorses difficulty with losing train of thought during conversation.      General Information   HPI Pt well-known to this clinician.from previous courses of therapy and PD screens. Last ST course ended January 2020.  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      Auditory Comprehension   Overall Auditory Comprehension Appears within functional limits for tasks assessed      Verbal Expression   Overall Verbal Expression Appears within functional limits for tasks assessed      Oral Motor/Sensory Function   Overall Oral Motor/Sensory Function Impaired    Labial ROM Reduced right;Reduced left   better with visual cues   Labial Coordination  Reduced    Lingual Coordination Reduced    Facial ROM Reduced right;Reduced left      Motor Speech   Overall Motor Speech Impaired    Respiration Impaired   pt with more thoracic and clavicular breathing (clavicular especially with loud /a/) than abdominal breathing   Phonation Low vocal intensity   In 15 minutes conversation volume was in upper 60s dB average. Average went from WNL in first 4 minutes to mid-upper 60s by end of conversation.   Articulation Within functional limitis    Intelligibility Intelligible    Effective Techniques Increased vocal intensity   pt incr'd his volume after loud /a/ from mid-upper 60s dB to WNL. SLP suspects this is due to 5 reps of loud /a/ with low -mid 90s dB average.                            SLP Education - 07/11/21 1236     Education Details Loud /a/ BID, everyday sentences BID, therapy goals, may need to do cognitive eval due to pt report of decr'd attention    Person(s) Educated Patient    Methods Explanation    Comprehension Verbalized understanding              SLP Short Term Goals - 07/11/21 1248       SLP SHORT TERM GOAL #1   Title pt will maintain loud /a/ average mid 90s dB with appropriate voicing over 4 sessions    Time 4    Period Weeks    Status New    Target Date 08/12/21      SLP SHORT TERM GOAL #2   Title pt will produce 20/20 sentences with average 70dB over two sessions    Time 4    Period Weeks    Target Date 08/12/21      SLP SHORT TERM GOAL #3   Title pt will demo simple conversation of 10 minutes with average 70dB over two sessions    Time 4    Period Weeks    Status New    Target Date 08/12/21      SLP SHORT TERM GOAL #4   Title pt will undergo cognitive linguistic evaluation and/or instrumental/objective swallow assessment PRN    Time 4    Period Weeks    Status New    Target Date 08/12/21              SLP Long Term Goals - 07/11/21 1250       SLP LONG TERM GOAL #1  Title pt will maintain loud /a/ at mid 90s dB with proper voicing over 6 sessions    Time 8    Period Weeks    Status New    Target Date 09/16/21      SLP LONG TERM GOAL #2   Title pt will demo 15 minutes mod complex conversastion with average low 70sdB given rare nonverbal cues over three sessions    Time 8    Period Weeks    Status New    Target Date 09/16/21      SLP LONG TERM GOAL #3   Title in the last 1-2 visits pt will verify less-frequent "losing my train of thought" compared to prior to ST    Time 8    Period Weeks    Status New    Target Date 09/16/21      SLP LONG TERM GOAL #4   Title pt will use abdominal breathing 80% of the time in 15 minutes mod complex conversation in 3 sessions    Time 8    Period Weeks    Status New    Target Date 09/16/21      SLP LONG TERM GOAL #5   Title pt will score higher than his initial score on a communication PROM in last 1-2 sessions    Time 8    Period Weeks    Status New    Target Date 09/16/21      Additional Long Term Goals   Additional Long Term Goals Yes              Plan - 07/11/21 1237     Clinical Impression Statement Pt presents today with mild-mod hypokinetic dysarthria due to parkinson's disease (PD), and reports  losing his train of thought more frequently the last 6-12 months. Pt reports WNL eating/drinking, denying overt s/s aspiration during meals, however pt may require objective swallow eval during this therapy course (MBSS, FEES) and signature on this plan of care will be understood to include agreement with SLP for necessity of any objective/instrumental swallow assessment . Pt talked at suboptimal dB (WNL for first 4 minutes and then decr'ing into average mid-upper 60s dB) until SLP guided pt though loud /a/ task, then pt  improved loudness to lower 70s dB average for approx 2 more minutes of conversation before decreasing average loudness ensued. SLP also noted thoracic and clavicular breathing instead of  abdominal breathing during conversation. SLP believes pt will benefit from skilled ST targeting incr'd volume, possibly some cognitive communication therapy to address attention in conversation, and greater usage of abdomoinal breathing in order to improve communicative effectiveness.    Speech Therapy Frequency 2x / week    Duration 8 weeks    Treatment/Interventions Environmental controls;Compensatory techniques;Functional tasks;Multimodal communcation approach;Cueing hierarchy;Language facilitation;Cognitive reorganization;Internal/external aids;Patient/family education;SLP instruction and feedback;Aspiration precaution training;Pharyngeal strengthening exercises;Oral motor exercises    Potential to Achieve Goals Good    SLP Home Exercise Plan provided    Consulted and Agree with Plan of Care Patient             Patient will benefit from skilled therapeutic intervention in order to improve the following deficits and impairments:   Dysarthria and anarthria  Dysphagia, unspecified type  Cognitive communication deficit    Problem List Patient Active Problem List   Diagnosis Date Noted   Constipation 03/24/2021   Gout 03/23/2021   Primary renal papillary carcinoma, right ([place]) 02/18/2020   Hepatic steatosis 02/18/2020   Major depression in  remission ([place]) 11/11/2019   Central sleep apnea due to Cheyne-Stokes respiration 08/31/2019   Hyperlipidemia associated with type 2 diabetes mellitus ([place]) 07/09/2019   Aortic atherosclerosis ([place]) by CXR on 06/10/2018 06/10/2018   Type 2 diabetes mellitus with stage 3a chronic kidney disease, without long-term current use of insulin ([place]) 01/21/2018   OSA treated with BiPAP 01/03/2017   REM sleep behavior disorder 12/06/2016   CKD stage 3 due to type 2 diabetes mellitus ([place]) 10/23/2016   Medication management 10/23/2016   Vitamin D deficiency 10/23/2016   Parkinson's disease ([place]) 01/04/2016   Seasonal and perennial allergic rhinitis  06/21/2014   Asthma, mild intermittent, well-controlled 06/21/2014   Pseudomonas aeruginosa colonization 10/08/2012   CAD (coronary artery disease) 11/15/2011   Essential hypertension 11/15/2011   Morbid obesity with BMI of 40.0-44.9, adult ([place]) 11/15/2011    [place], [place] 07/11/2021, 12:59 PM  [facility] [address] [place], [place], [address] Phone: [phone]   Fax:  [phone]  Name: GAVRIEL HOLZHAUER MRN: [id number] Date of Birth: March 21, 1956

## 2021-07-11 NOTE — Patient Instructions (Signed)
? ?  Get back to doing those FIVE loud "ah" twice a day  ?Look for those sentences and replace any of them that aren't relevant anymore. ?Shout those after you do your loud "ah" ? ? ? ? ?

## 2021-07-12 ENCOUNTER — Other Ambulatory Visit: Payer: Self-pay | Admitting: Adult Health

## 2021-07-12 ENCOUNTER — Other Ambulatory Visit (HOSPITAL_BASED_OUTPATIENT_CLINIC_OR_DEPARTMENT_OTHER): Payer: Self-pay

## 2021-07-12 MED ORDER — ALLOPURINOL 300 MG PO TABS
ORAL_TABLET | ORAL | 3 refills | Status: DC
  Filled 2021-07-12: qty 90, 90d supply, fill #0
  Filled 2021-10-18: qty 90, 90d supply, fill #1
  Filled 2022-01-17: qty 90, 90d supply, fill #2
  Filled 2022-03-15 – 2022-04-12 (×2): qty 90, 90d supply, fill #3

## 2021-07-14 ENCOUNTER — Ambulatory Visit: Payer: Medicare Other | Admitting: Physical Therapy

## 2021-07-14 ENCOUNTER — Telehealth: Payer: Self-pay

## 2021-07-14 ENCOUNTER — Other Ambulatory Visit: Payer: Self-pay

## 2021-07-14 ENCOUNTER — Ambulatory Visit: Payer: Medicare Other

## 2021-07-14 DIAGNOSIS — G20 Parkinson's disease: Secondary | ICD-10-CM | POA: Diagnosis not present

## 2021-07-14 DIAGNOSIS — R1310 Dysphagia, unspecified: Secondary | ICD-10-CM | POA: Diagnosis not present

## 2021-07-14 DIAGNOSIS — R2689 Other abnormalities of gait and mobility: Secondary | ICD-10-CM

## 2021-07-14 DIAGNOSIS — R41841 Cognitive communication deficit: Secondary | ICD-10-CM

## 2021-07-14 DIAGNOSIS — R2681 Unsteadiness on feet: Secondary | ICD-10-CM

## 2021-07-14 DIAGNOSIS — R293 Abnormal posture: Secondary | ICD-10-CM

## 2021-07-14 DIAGNOSIS — R471 Dysarthria and anarthria: Secondary | ICD-10-CM | POA: Diagnosis not present

## 2021-07-14 DIAGNOSIS — R29818 Other symptoms and signs involving the nervous system: Secondary | ICD-10-CM | POA: Diagnosis not present

## 2021-07-14 NOTE — Telephone Encounter (Signed)
LM-07/14/21-Called patient and confirmed upcoming visit w/ cpp and went over precall questions. ? ?Total time spent: 5 Minutes ?Vanetta Shawl, [facility] ?

## 2021-07-14 NOTE — Patient Instructions (Signed)
?  Please complete the assigned speech therapy homework prior to your next session - 20 minutes/day ? ?

## 2021-07-14 NOTE — Therapy (Signed)
[facility] [address] [place], [place], [address] Phone: [phone]   Fax:  [phone]  Physical Therapy Treatment  Patient Details  Name: Todd Rice MRN: [id number] Date of Birth: 26-Apr-1956 Referring Provider (PT): Penumalli/[place]   Encounter Date: 07/14/2021   PT End of Session - 07/14/21 0840     Visit Number 2    Number of Visits 10    Date for PT Re-Evaluation 08/12/21    Authorization Type U[employer] Medicare    PT Start Time 0846    PT Stop Time 0928    PT Time Calculation (min) 42 min    Behavior During Therapy [facility] for tasks assessed/performed             Past Medical History:  Diagnosis Date   Adult BMI 50.0-59.9 kg/sq m    52.77   Anal fissure    Anxiety    Diabetes mellitus without complication ([place])    borderline   Elevated PSA 08/13/2019   GERD (gastroesophageal reflux disease)    30 years ago, maybe from peptic ulcer   H/O: gout    STABLE   Hepatic steatosis 10/01/11   History of kidney stones    Hyperlipidemia    Hypertension    Kidney tumor    right upper kidney tumor, monitoring   OSA on CPAP    [employer] [facility]   Parkinson disease ([place]) 01/04/2016   REM sleep behavior disorder 12/06/2016   Right ureteral stone    Ureteral calculus, right 06/12/2011   Vitamin D deficiency    Weakness     Past Surgical History:  Procedure Laterality Date   ANAL FISSURE REPAIR  10-12-2000   COLONOSCOPY N/A 07/01/2012   Procedure: COLONOSCOPY;  Surgeon: Lafayette Dragon, MD;  [facility]: [facility];  Service: Endoscopy;  Laterality: N/A;   CYSTOSCOPY WITH RETROGRADE PYELOGRAM, URETEROSCOPY AND STENT PLACEMENT Bilateral 02/26/2013   Procedure: CYSTOSCOPY WITH RETROGRADE PYELOGRAM, URETEROSCOPY AND STENT PLACEMENT;  Surgeon: Alexis Frock, MD;  Location: [facility];  Service: Urology;  Laterality: Bilateral;   HOLMIUM LASER APPLICATION Bilateral 14/43/1540   Procedure: HOLMIUM LASER APPLICATION;  Surgeon: Alexis Frock, MD;  Location: [facility];  Service: Urology;  Laterality: Bilateral;   kidney stone removal     06/2011-stent placement   LEFT MEDIAL FEMORAL CONDYLE DEBRIDEMENT & DRILLING/ REMOVAL LOOSE BODY  09-15-2003   NASAL SEPTUM SURGERY  1985   STONE EXTRACTION WITH BASKET  06/12/2011   Procedure: STONE EXTRACTION WITH BASKET;  Surgeon: Claybon Jabs, MD;  Location: [facility];  Service: Urology;  Laterality: Right;    There were no vitals filed for this visit.   Subjective Assessment - 07/14/21 0840     Subjective Nothing new.    Patient Stated Goals Pt's goal is to get my mind right about my Parkinson's.    Currently in Pain? No/denies                             Pt performs PWR! Moves in standing position x 10 reps   PWR! Up for improved posture  PWR! Rock for improved weighshifting  PWR! Twist for improved trunk rotation   PWR! Step for improved step initiation   Performed at beginning of session for warm-up, pt rates as 3.5/10           Balance Exercises - 07/14/21 0001       Balance  Exercises: Standing   Standing Eyes Opened Wide (BOA);Narrow base of support (BOS);Foam/compliant surface;Limitations    Standing Eyes Opened Limitations Head turns/head nods x 10 reps    Standing Eyes Closed Wide (BOA);Narrow base of support (BOS);Foam/compliant surface;Limitations    Standing Eyes Closed Limitations Head turns/nods x 5 EC; also head steady 15 seconds EC    Stepping Strategy Anterior;Posterior;Lateral;Foam/compliant surface;10 reps;Limitations    Stepping Strategy Limitations Cues for increased step height    Balance Beam Forward/back step and weigthshift x 10 reps.    Sidestepping 2 reps;Limitations    Sidestepping Limitations Coordinated UE open/close wtih sidestepping, 25 ft, 2 reps R and L    Marching Solid surface;Forwards;Limitations    Marching Limitations Coordinated UE movements with forward marching                 PT Education - 07/14/21 [phone]     Education Details Reviewed standing PWR! Moves and educated on importance of consistency of standing PWR! Moves exercises    Person(s) Educated Patient    Methods Explanation    Comprehension Verbalized understanding              PT Short Term Goals - 07/11/21 1149       PT SHORT TERM GOAL #1   Title Pt will be independent with HEP for improved balance and gait.  TARGET 07/29/2021    Time 2    Period Weeks    Status New               PT Long Term Goals - 07/11/21 1150       PT LONG TERM GOAL #1   Title Pt will independent with progression of HEP to address Parkinson's deficits for improved balance and gait.  TARGET 08/12/2021    Time 5    Period Weeks    Status New      PT LONG TERM GOAL #2   Title Pt will perform stnading eyes closed on foam with minimal to no sway for improved balance.    Time 5    Period Weeks    Status New      PT LONG TERM GOAL #3   Title Pt will improve forward push and release to 1 step or less for improved balance recovery.    Time 5    Period Weeks    Status New      PT LONG TERM GOAL #4   Title Pt will improve TUG/TUG cognitive score to less than or equal to 10% difference for improved dual tasking with gait.    Baseline TUG 10.91, cog 14.22    Time 5    Period Weeks    Status New                   Plan - 07/14/21 0924     Clinical Impression Statement Initiated HEP this visit for PWR! Moves in standing.  Pt reports not currently performing exercises at home and encouraged pt to consistently add back in PWR! MOves in standing.  Performed standing exercises on compliant surfaces, with intermittent UE support and increased sway noteded wtih head motions and eyes closed.  He will continue to benefit from skilled PT towards goals for imrpoved balance and mobility    Personal Factors and Comorbidities Comorbidity 3+    Comorbidities See problem list    Examination-Activity Limitations  Locomotion Level;Stand    Examination-Participation Restrictions Community Activity   community fitness   Stability/Clinical Decision Making  Evolving/Moderate complexity    Rehab Potential Good    PT Frequency Other (comment)   1x/wk for 1 week, then 2x/wk for 4 weeks   PT Duration Other (comment)   total POC = 5 weeks   PT Treatment/Interventions ADLs/Self Care Home Management;Gait training;Stair training;Functional mobility training;Therapeutic activities;Therapeutic exercise;Balance training;Neuromuscular re-education;Patient/family education    PT Next Visit Plan REview HEP-PWR! Moves standing, quadruped; balance exercises-compliant surface-EO head motions, EC, step strategy work    Oncologist with Plan of Care Patient             Patient will benefit from skilled therapeutic intervention in order to improve the following deficits and impairments:  Abnormal gait, Difficulty walking, Decreased balance, Decreased mobility, Postural dysfunction  Visit Diagnosis: Unsteadiness on feet  Abnormal posture  Other abnormalities of gait and mobility     Problem List Patient Active Problem List   Diagnosis Date Noted   Constipation 03/24/2021   Gout 03/23/2021   Primary renal papillary carcinoma, right ([place]) 02/18/2020   Hepatic steatosis 02/18/2020   Major depression in remission ([place]) 11/11/2019   Central sleep apnea due to Cheyne-Stokes respiration 08/31/2019   Hyperlipidemia associated with type 2 diabetes mellitus ([place]) 07/09/2019   Aortic atherosclerosis ([place]) by CXR on 06/10/2018 06/10/2018   Type 2 diabetes mellitus with stage 3a chronic kidney disease, without long-term current use of insulin ([place]) 01/21/2018   OSA treated with BiPAP 01/03/2017   REM sleep behavior disorder 12/06/2016   CKD stage 3 due to type 2 diabetes mellitus ([place]) 10/23/2016   Medication management 10/23/2016   Vitamin D deficiency 10/23/2016   Parkinson's disease ([place]) 01/04/2016    Seasonal and perennial allergic rhinitis 06/21/2014   Asthma, mild intermittent, well-controlled 06/21/2014   Pseudomonas aeruginosa colonization 10/08/2012   CAD (coronary artery disease) 11/15/2011   Essential hypertension 11/15/2011   Morbid obesity with BMI of 40.0-44.9, adult ([place]) 11/15/2011    Nylan Nakatani W., PT 07/14/2021, 9:34 AM  [facility] [address], [place], [place], [address] Phone: [phone]   Fax:  [phone]  Name: Todd Rice MRN: [id number] Date of Birth: 07-28-1955

## 2021-07-14 NOTE — Therapy (Signed)
[facility] [address], [place], [place], [address] Phone: [phone]   Fax:  [phone]  Speech Language Pathology Treatment  Patient Details  Name: Todd Rice MRN: [id number] Date of Birth: 07-Jan-1956 Referring Provider (SLP): Seymour Bars, NP   Encounter Date: 07/14/2021   End of Session - 07/14/21 0849     Visit Number 2    Number of Visits 17    Date for SLP Re-Evaluation 09/19/21    SLP Start Time 0804    SLP Stop Time  0845    SLP Time Calculation (min) 41 min    Activity Tolerance Patient tolerated treatment well             Past Medical History:  Diagnosis Date   Adult BMI 50.0-59.9 kg/sq m    52.77   Anal fissure    Anxiety    Diabetes mellitus without complication ([place])    borderline   Elevated PSA 08/13/2019   GERD (gastroesophageal reflux disease)    30 years ago, maybe from peptic ulcer   H/O: gout    STABLE   Hepatic steatosis 10/01/11   History of kidney stones    Hyperlipidemia    Hypertension    Kidney tumor    right upper kidney tumor, monitoring   OSA on CPAP    [employer] [facility]   Parkinson disease ([place]) 01/04/2016   REM sleep behavior disorder 12/06/2016   Right ureteral stone    Ureteral calculus, right 06/12/2011   Vitamin D deficiency    Weakness     Past Surgical History:  Procedure Laterality Date   ANAL FISSURE REPAIR  10-12-2000   COLONOSCOPY N/A 07/01/2012   Procedure: COLONOSCOPY;  Surgeon: Lafayette Dragon, MD;  [facility];  Service: Endoscopy;  Laterality: N/A;   CYSTOSCOPY WITH RETROGRADE PYELOGRAM, URETEROSCOPY AND STENT PLACEMENT Bilateral 02/26/2013   Procedure: CYSTOSCOPY WITH RETROGRADE PYELOGRAM, URETEROSCOPY AND STENT PLACEMENT;  Surgeon: Alexis Frock, MD;  [facility];  Service: Urology;  Laterality: Bilateral;   HOLMIUM LASER APPLICATION Bilateral 22/06/5425   Procedure: HOLMIUM LASER APPLICATION;  Surgeon: Alexis Frock, MD;  [facility];   Service: Urology;  Laterality: Bilateral;   kidney stone removal     06/2011-stent placement   LEFT MEDIAL FEMORAL CONDYLE DEBRIDEMENT & DRILLING/ REMOVAL LOOSE BODY  09-15-2003   NASAL SEPTUM SURGERY  1985   STONE EXTRACTION WITH BASKET  06/12/2011   Procedure: STONE EXTRACTION WITH BASKET;  Surgeon: Claybon Jabs, MD;  Location: [facility];  Service: Urology;  Laterality: Right;    There were no vitals filed for this visit.   Subjective Assessment - 07/14/21 0816     Subjective "My Bi-Pap machine dries me out at night."                   ADULT SLP TREATMENT - 07/14/21 0817       General Information   Behavior/Cognition Alert;Cooperative;Pleasant mood      Treatment Provided   Treatment provided Cognitive-Linquistic      Cognitive-Linquistic Treatment   Treatment focused on Dysarthria    Skilled Treatment SLP used loud /a/ reps to habitualize louder speech in conversation - pt average low 90s dB. Pt did not generate nor find his everyday sentences, SLP encouraged pt to bring them next session. In sentence responses pt req'd occasional mod cues faded to rare min A for loudness/effort. Carryover was not seen spontaneously in conversation today.  Assessment / Recommendations / Plan   Plan Continue with current plan of care      Progression Toward Goals   Progression toward goals Progressing toward goals                SLP Short Term Goals - 07/14/21 2336       SLP SHORT TERM GOAL #1   Title pt will maintain loud /a/ average mid 90s dB with appropriate voicing over 4 sessions    Time 4    Period Weeks    Status On-going    Target Date 08/12/21      SLP SHORT TERM GOAL #2   Title pt will produce 20/20 sentences with average 70dB over two sessions    Time 4    Period Weeks    Status On-going    Target Date 08/12/21      SLP SHORT TERM GOAL #3   Title pt will demo simple conversation of 10 minutes with average 70dB over two sessions     Time 4    Period Weeks    Status On-going    Target Date 08/12/21      SLP SHORT TERM GOAL #4   Title pt will undergo cognitive linguistic evaluation and/or instrumental/objective swallow assessment PRN    Time 4    Period Weeks    Status On-going    Target Date 08/12/21              SLP Long Term Goals - 07/14/21 2337       SLP LONG TERM GOAL #1   Title pt will maintain loud /a/ at mid 90s dB with proper voicing over 6 sessions    Time 8    Period Weeks    Status On-going    Target Date 09/16/21      SLP LONG TERM GOAL #2   Title pt will demo 15 minutes mod complex conversastion with average low 70sdB given rare nonverbal cues over three sessions    Time 8    Period Weeks    Status On-going    Target Date 09/16/21      SLP LONG TERM GOAL #3   Title in the last 1-2 visits pt will verify less-frequent "losing my train of thought" compared to prior to ST    Time 8    Period Weeks    Status On-going    Target Date 09/16/21      SLP LONG TERM GOAL #4   Title pt will use abdominal breathing 80% of the time in 15 minutes mod complex conversation in 3 sessions    Time 8    Period Weeks    Status On-going    Target Date 09/16/21      SLP LONG TERM GOAL #5   Title pt will score higher than his initial score on a communication PROM in last 1-2 sessions    Time 8    Period Weeks    Status New    Target Date 09/16/21              Plan - 07/14/21 2335     Clinical Impression Statement Todd Rice presents today with cont'd mild-mod hypokinetic dysarthria due to parkinson's disease (PD), and reports  losing his train of thought more frequently the last 6-12 months. Pt reports WNL eating/drinking, denying overt s/s aspiration during meals, however pt may require objective swallow eval during this therapy course (MBSS, FEES) and signature on this plan of care  will be understood to include agreement with SLP for necessity of any objective/instrumental swallow assessment.  SLP believes pt will benefit from skilled ST targeting incr'd volume, possibly some cognitive communication therapy to address attention in conversation, and greater usage of abdomoinal breathing in order to improve communicative effectiveness.    Speech Therapy Frequency 2x / week    Duration 8 weeks    Treatment/Interventions Environmental controls;Compensatory techniques;Functional tasks;Multimodal communcation approach;Cueing hierarchy;Language facilitation;Cognitive reorganization;Internal/external aids;Patient/family education;SLP instruction and feedback;Aspiration precaution training;Pharyngeal strengthening exercises;Oral motor exercises    Potential to Achieve Goals Good    SLP Home Exercise Plan provided    Consulted and Agree with Plan of Care Patient             Patient will benefit from skilled therapeutic intervention in order to improve the following deficits and impairments:   Dysarthria and anarthria  Dysphagia, unspecified type  Cognitive communication deficit    Problem List Patient Active Problem List   Diagnosis Date Noted   Constipation 03/24/2021   Gout 03/23/2021   Primary renal papillary carcinoma, right ([place]) 02/18/2020   Hepatic steatosis 02/18/2020   Major depression in remission ([place]) 11/11/2019   Central sleep apnea due to Cheyne-Stokes respiration 08/31/2019   Hyperlipidemia associated with type 2 diabetes mellitus ([place]) 07/09/2019   Aortic atherosclerosis ([place]) by CXR on 06/10/2018 06/10/2018   Type 2 diabetes mellitus with stage 3a chronic kidney disease, without long-term current use of insulin ([place]) 01/21/2018   OSA treated with BiPAP 01/03/2017   REM sleep behavior disorder 12/06/2016   CKD stage 3 due to type 2 diabetes mellitus ([place]) 10/23/2016   Medication management 10/23/2016   Vitamin D deficiency 10/23/2016   Parkinson's disease ([place]) 01/04/2016   Seasonal and perennial allergic rhinitis 06/21/2014   Asthma, mild intermittent,  well-controlled 06/21/2014   Pseudomonas aeruginosa colonization 10/08/2012   CAD (coronary artery disease) 11/15/2011   Essential hypertension 11/15/2011   Morbid obesity with BMI of 40.0-44.9, adult ([employer]) 11/15/2011    [place], [place] 07/14/2021, 11:38 PM   [facility] [address], [place], [place], [address] Phone: [phone]   Fax:  [phone]   Name: Todd Rice MRN: [id number] Date of Birth: 13-Mar-1956

## 2021-07-18 ENCOUNTER — Ambulatory Visit: Payer: Medicare Other | Admitting: Physical Therapy

## 2021-07-18 ENCOUNTER — Ambulatory Visit: Payer: Medicare Other

## 2021-07-19 ENCOUNTER — Ambulatory Visit: Payer: Medicare Other | Admitting: Pharmacist

## 2021-07-20 ENCOUNTER — Ambulatory Visit: Payer: Medicare Other | Admitting: Physical Therapy

## 2021-07-20 ENCOUNTER — Ambulatory Visit: Payer: Medicare Other

## 2021-07-22 ENCOUNTER — Other Ambulatory Visit (HOSPITAL_BASED_OUTPATIENT_CLINIC_OR_DEPARTMENT_OTHER): Payer: Self-pay

## 2021-07-25 ENCOUNTER — Other Ambulatory Visit: Payer: Self-pay

## 2021-07-25 ENCOUNTER — Ambulatory Visit: Payer: Medicare Other

## 2021-07-25 ENCOUNTER — Ambulatory Visit: Payer: Medicare Other | Admitting: Rehabilitative and Restorative Service Providers"

## 2021-07-25 DIAGNOSIS — R471 Dysarthria and anarthria: Secondary | ICD-10-CM | POA: Diagnosis not present

## 2021-07-25 DIAGNOSIS — R2689 Other abnormalities of gait and mobility: Secondary | ICD-10-CM

## 2021-07-25 DIAGNOSIS — R29818 Other symptoms and signs involving the nervous system: Secondary | ICD-10-CM | POA: Diagnosis not present

## 2021-07-25 DIAGNOSIS — R293 Abnormal posture: Secondary | ICD-10-CM

## 2021-07-25 DIAGNOSIS — R1310 Dysphagia, unspecified: Secondary | ICD-10-CM | POA: Diagnosis not present

## 2021-07-25 DIAGNOSIS — R2681 Unsteadiness on feet: Secondary | ICD-10-CM | POA: Diagnosis not present

## 2021-07-25 DIAGNOSIS — R41841 Cognitive communication deficit: Secondary | ICD-10-CM

## 2021-07-25 DIAGNOSIS — G20 Parkinson's disease: Secondary | ICD-10-CM | POA: Diagnosis not present

## 2021-07-25 NOTE — Patient Instructions (Addendum)
? ?   EVERYDAY SENTENCES ? ?You ready for your DB? ? ?What time will you be home tonight? ? ?I plan on cooking dinner tonight. ? ?Pour me a half and half. ? ?Did we get any mail today? ? ?Which car do you want to drive today? ? ?7 ? ?8 ? ?9 ? ?10 ? ?Everyday: ?5 loud "ah" x2 ?Read everyday sentences x2 ?Practice abdominal breathing 10 minutes x2 ?Do a worksheet with LOUD voice ? ? ?

## 2021-07-25 NOTE — Therapy (Signed)
[place] ?[facility] ?[address] ?[place], [place], [address] ?Phone: [phone]   Fax:  [phone] ? ?Speech Language Pathology Treatment ? ?Patient Details  ?Name: KORDE JEPPSEN ?MRN: [id number] ?Date of Birth: [birth year] ?Referring Provider (SLP): Seymour Bars, NP ? ? ?Encounter Date: 07/25/2021 ? ? End of Session - 07/25/21 1016   ? ? Visit Number 3   ? Number of Visits 17   ? Date for SLP Re-Evaluation 09/19/21   ? SLP Start Time [phone]   ? SLP Stop Time  0930   ? SLP Time Calculation (min) 43 min   ? Activity Tolerance Patient tolerated treatment well   ? ?  ?  ? ?  ? ? ?Past Medical History:  ?Diagnosis Date  ? Adult BMI 50.0-59.9 kg/sq m   ? 52.77  ? Anal fissure   ? Anxiety   ? Diabetes mellitus without complication ([place])   ? borderline  ? Elevated PSA 08/13/2019  ? GERD (gastroesophageal reflux disease)   ? 30 years ago, maybe from peptic ulcer  ? H/O: gout   ? STABLE  ? Hepatic steatosis 10/01/11  ? History of kidney stones   ? Hyperlipidemia   ? Hypertension   ? Kidney tumor   ? right upper kidney tumor, monitoring  ? OSA on CPAP   ? [employer] [facility]  ? Parkinson disease ([place]) 01/04/2016  ? REM sleep behavior disorder 12/06/2016  ? Right ureteral stone   ? Ureteral calculus, right 06/12/2011  ? Vitamin D deficiency   ? Weakness   ? ? ?Past Surgical History:  ?Procedure Laterality Date  ? ANAL FISSURE REPAIR  10-12-2000  ? COLONOSCOPY N/A 07/01/2012  ? Procedure: COLONOSCOPY;  Surgeon: Lafayette Dragon, MD;  Location: [facility];  Service: Endoscopy;  Laterality: N/A;  ? CYSTOSCOPY WITH RETROGRADE PYELOGRAM, URETEROSCOPY AND STENT PLACEMENT Bilateral 02/26/2013  ? Procedure: CYSTOSCOPY WITH RETROGRADE PYELOGRAM, URETEROSCOPY AND STENT PLACEMENT;  Surgeon: Alexis Frock, MD;  Location: [facility];  Service: Urology;  Laterality: Bilateral;  ? HOLMIUM LASER APPLICATION Bilateral 38/02/1750  ? Procedure: HOLMIUM LASER APPLICATION;  Surgeon: Alexis Frock, MD;  Location: [facility];   Service: Urology;  Laterality: Bilateral;  ? kidney stone removal    ? 06/2011-stent placement  ? LEFT MEDIAL FEMORAL CONDYLE DEBRIDEMENT & DRILLING/ REMOVAL LOOSE BODY  09-15-2003  ? NASAL SEPTUM SURGERY  1985  ? STONE EXTRACTION WITH BASKET  06/12/2011  ? Procedure: STONE EXTRACTION WITH BASKET;  Surgeon: Claybon Jabs, MD;  Location[facility];  Service: Urology;  Laterality: Right;  ? ? ?There were no vitals filed for this visit. ? ? Subjective Assessment - 07/25/21 0851   ? ? Subjective "Sorry I had to miss - I wasn't feeling good."   ? Currently in Pain? No/denies   ? ?  ?  ? ?  ? ? ? ? ? ? ? ? ADULT SLP TREATMENT - 07/25/21 0852   ? ?  ? General Information  ? Behavior/Cognition Alert;Cooperative;Pleasant mood   ?  ? Treatment Provided  ? Treatment provided Cognitive-Linquistic   ?  ? Cognitive-Linquistic Treatment  ? Treatment focused on Dysarthria   ? Skilled Treatment Pt brought his communication effectiveness survey, with 24/30 as the score (higher scores indicate pt's speech is more effective). Pt returned everyday sentences with sentences that were not said everyday so SLP took time to generate sentences with pt and put them in his chart. SLP and pt  took 4 sentences from his list and thought of 2 more. Pt will take home and return with 4 more setences. SLP then used loud /a/ to habitualize WNL conversational volume and pt averaged low-mid 90s dB. SLP had to use cues to bring pitch down to spaking range due to pt moving up in pitch out of speaking range on 3 straight productions. 6 everyday sentences were produced with average mid-80s dB. "I felt like I was shouting," pt stated. SLP told pt he would be shouting with those sentences. SLP worked with pt on abdominal breathing (AB), as SLP noticed he was breathing with chest and clavicular breathing during conversation. Pt req'd usual mod cues initially faded to occasional min cues for AB at rest. At times SLP noted pt starting breath mid  exhale likely due to pt's level of focus on proper abdominal movement. SLP told pt to practice AB 10 minutes BID.   ?  ? Assessment / Recommendations / Plan  ? Plan Continue with current plan of care   ?  ? Progression Toward Goals  ? Progression toward goals Progressing toward goals   ? ?  ?  ? ?  ? ? ? SLP Education - 07/25/21 1016   ? ? Education Details abdominal breathing   ? Person(s) Educated Patient   ? Methods Explanation;Demonstration;Verbal cues   ? Comprehension Verbalized understanding;Returned demonstration;Verbal cues required;Need further instruction   ? ?  ?  ? ?  ? ? ? SLP Short Term Goals - 07/25/21 1017   ? ?  ? SLP SHORT TERM GOAL #1  ? Title pt will maintain loud /a/ average mid 90s dB with appropriate voicing over 4 sessions   ? Time 4   ? Period Weeks   ? Status On-going   ? Target Date 08/12/21   ?  ? SLP SHORT TERM GOAL #2  ? Title pt will produce 20/20 sentences with average 70dB over two sessions   ? Time 4   ? Period Weeks   ? Status On-going   ? Target Date 08/12/21   ?  ? SLP SHORT TERM GOAL #3  ? Title pt will demo simple conversation of 10 minutes with average 70dB over two sessions   ? Time 4   ? Period Weeks   ? Status On-going   ? Target Date 08/12/21   ?  ? SLP SHORT TERM GOAL #4  ? Title pt will undergo cognitive linguistic evaluation and/or instrumental/objective swallow assessment PRN   ? Time 4   ? Period Weeks   ? Status On-going   ? Target Date 08/12/21   ? ?  ?  ? ?  ? ? ? SLP Long Term Goals - 07/25/21 1017   ? ?  ? SLP LONG TERM GOAL #1  ? Title pt will maintain loud /a/ at mid 90s dB with proper voicing over 6 sessions   ? Time 8   ? Period Weeks   ? Status On-going   ?  ? SLP LONG TERM GOAL #2  ? Title pt will demo 15 minutes mod complex conversastion with average low 70sdB given rare nonverbal cues over three sessions   ? Time 8   ? Period Weeks   ? Status On-going   ?  ? SLP LONG TERM GOAL #3  ? Title in the last 1-2 visits pt will verify less-frequent "losing my  train of thought" compared to prior to [place]   ? Time 8   ?  Period Weeks   ? Status On-going   ?  ? SLP LONG TERM GOAL #4  ? Title pt will use abdominal breathing 80% of the time in 15 minutes mod complex conversation in 3 sessions   ? Time 8   ? Period Weeks   ? Status On-going   ?  ? SLP LONG TERM GOAL #5  ? Title pt will score higher than his initial score on a communication PROM in last 1-2 sessions   ? Baseline 24/30   ? Time 8   ? Period Weeks   ? Status New   ? ?  ?  ? ?  ? ? ? Plan - 07/25/21 1016   ? ? Clinical Impression Statement Lori presents today with cont'd mild-mod hypokinetic dysarthria due to parkinson's disease (PD), and reports  losing his train of thought more frequently the last 6-12 months.See "skilled intervention" for more details of today's session. Pt reports WNL eating/drinking, denying overt s/s aspiration during meals, however pt may require objective swallow eval during this therapy course (MBSS, FEES) and signature on this plan of care will be understood to include agreement with SLP for necessity of any objective/instrumental swallow assessment. SLP believes pt will benefit from skilled ST targeting incr'd volume, possibly some cognitive communication therapy to address attention in conversation, and greater usage of abdomoinal breathing in order to improve communicative effectiveness.   ? Speech Therapy Frequency 2x / week   ? Duration 8 weeks   ? Treatment/Interventions Environmental controls;Compensatory techniques;Functional tasks;Multimodal communcation approach;Cueing hierarchy;Language facilitation;Cognitive reorganization;Internal/external aids;Patient/family education;SLP instruction and feedback;Aspiration precaution training;Pharyngeal strengthening exercises;Oral motor exercises   ? Potential to Achieve Goals Good   ? SLP Home Exercise Plan provided   ? Consulted and Agree with Plan of Care Patient   ? ?  ?  ? ?  ? ? ?Patient will benefit from skilled therapeutic  intervention in order to improve the following deficits and impairments:   ?Dysarthria and anarthria ? ?Dysphagia, unspecified type ? ?Cognitive communication deficit ? ? ? ?Problem List ?Patient Active Problem List

## 2021-07-25 NOTE — Therapy (Signed)
[place] ?[facility] ?[address], [address] ?[place], [place], [address] ?Phone: [phone]   Fax:  [phone] ? ?Physical Therapy Treatment ? ?Patient Details  ?Name: Todd Rice ?MRN: [id number] ?Date of Birth: [birth year] ?Referring Provider (PT): [place] ? ? ?Encounter Date: 07/25/2021 ? ? PT End of Session - 07/25/21 3716   ? ? Visit Number 3   ? Number of Visits 10   ? Date for PT Re-Evaluation 08/12/21   ? Authorization Type [employer] Medicare   ? PT Start Time [id number]   ? PT Stop Time [id number]   ? PT Time Calculation (min) 39 min   ? Behavior During Therapy [facility] for tasks assessed/performed   ? ?  ?  ? ?  ? ? ?Past Medical History:  ?Diagnosis Date  ? Adult BMI 50.0-59.9 kg/sq m   ? 52.77  ? Anal fissure   ? Anxiety   ? Diabetes mellitus without complication ([place])   ? borderline  ? Elevated PSA 08/13/2019  ? GERD (gastroesophageal reflux disease)   ? 30 years ago, maybe from peptic ulcer  ? H/O: gout   ? STABLE  ? Hepatic steatosis 10/01/11  ? History of kidney stones   ? Hyperlipidemia   ? Hypertension   ? Kidney tumor   ? right upper kidney tumor, monitoring  ? OSA on CPAP   ? [employer] [facility]  ? Parkinson disease ([place]) 01/04/2016  ? REM sleep behavior disorder 12/06/2016  ? Right ureteral stone   ? Ureteral calculus, right 06/12/2011  ? Vitamin D deficiency   ? Weakness   ? ? ?Past Surgical History:  ?Procedure Laterality Date  ? ANAL FISSURE REPAIR  10-12-2000  ? COLONOSCOPY N/A 07/01/2012  ? Procedure: COLONOSCOPY;  Surgeon: Lafayette Dragon, MD;  [facility];  Service: Endoscopy;  Laterality: N/A;  ? CYSTOSCOPY WITH RETROGRADE PYELOGRAM, URETEROSCOPY AND STENT PLACEMENT Bilateral 02/26/2013  ? Procedure: CYSTOSCOPY WITH RETROGRADE PYELOGRAM, URETEROSCOPY AND STENT PLACEMENT;  Surgeon: Alexis Frock, MD;  [facility];  Service: Urology;  Laterality: Bilateral;  ? HOLMIUM LASER APPLICATION Bilateral 01/75/1025  ? Procedure: HOLMIUM LASER APPLICATION;  Surgeon: Alexis Frock, MD;  [facility]: [facility];  Service: Urology;  Laterality: Bilateral;  ? kidney stone removal    ? 06/2011-stent placement  ? LEFT MEDIAL FEMORAL CONDYLE DEBRIDEMENT & DRILLING/ REMOVAL LOOSE BODY  09-15-2003  ? NASAL SEPTUM SURGERY  1985  ? STONE EXTRACTION WITH BASKET  06/12/2011  ? Procedure: STONE EXTRACTION WITH BASKET;  Surgeon: Claybon Jabs, MD;  [facility];  Service: Urology;  Laterality: Right;  ? ? ?There were no vitals filed for this visit. ? ? Subjective Assessment - 07/25/21 0804   ? ? Subjective The patient notes he had a stomach bug last week and is not fully recovered.   ? Patient Stated Goals Pt's goal is to get my mind right about my Parkinson's.   ? Currently in Pain? No/denies   ? ?  ?  ? ?  ? ? ? ? ? ? ? ? ? ? ? ? ? ? ? ? ? ? ? ? [place] Adult PT Treatment/Exercise - 07/25/21 0824   ? ?  ? Ambulation/Gait  ? Ambulation/Gait Yes   ? Ambulation/Gait Assistance 6: Modified independent (Device/Increase time)   ? Assistive device None   ? Ambulation Surface Level;Indoor   ? Gait Comments large amplitude walking with tactile and verbal cues for reciprocal arm swing, high  knee marching x 30 feet x 5 reps, backwards walking 30 feet x 5 reps   ?  ? Posture/Postural Control  ? Posture Comments postural cues for upright; patient showing improved position of ears over shoulders over hips today   ?  ? Neuro Re-ed   ? Neuro Re-ed Details  Ankle, hip and stepping strategies working on posterior to midline near wall for ankle, wide steps off foam laterally, and posterior stepping during large amplitude walking backwards.  Combined stepping with turns for large amplitude training:  stepping R and turning quarter turn/ stepping L and turning quarter turn over 6" obstacles with CGA.  Foam standing eyes open/closed, stepping off/on of foam with CGA.  Rocker board with eyes open head horiz and vertical turns.  *Has difficulty with vertical turns requiring min to mod A when looking up/down.   He is able to perform sidestepping with direction changes with supervision x 10 feet x 4 reps R and L.  Rocker board with reactive balance for strategies.   ? ?  ?  ? ?  ? ? ? ? [facility]) - 07/25/21 1829   ? ? PWR! Up 10 reps   ? PWR! Rock 10 reps   ? PWR! Twist 10 reps   ? PWR Step 10 reps   ? Comments large amplitude motion   ? ?  ?  ? ?  ? ? ? ? ? ? ? ? ? PT Short Term Goals - 07/11/21 1149   ? ?  ? PT SHORT TERM GOAL #1  ? Title Pt will be independent with HEP for improved balance and gait.  TARGET 07/29/2021   ? Time 2   ? Period Weeks   ? Status New   ? ?  ?  ? ?  ? ? ? ? PT Long Term Goals - 07/11/21 1150   ? ?  ? PT LONG TERM GOAL #1  ? Title Pt will independent with progression of HEP to address Parkinson's deficits for improved balance and gait.  TARGET 08/12/2021   ? Time 5   ? Period Weeks   ? Status New   ?  ? PT LONG TERM GOAL #2  ? Title Pt will perform stnading eyes closed on foam with minimal to no sway for improved balance.   ? Time 5   ? Period Weeks   ? Status New   ?  ? PT LONG TERM GOAL #3  ? Title Pt will improve forward push and release to 1 step or less for improved balance recovery.   ? Time 5   ? Period Weeks   ? Status New   ?  ? PT LONG TERM GOAL #4  ? Title Pt will improve TUG/TUG cognitive score to less than or equal to 10% difference for improved dual tasking with gait.   ? Baseline TUG 10.91, cog 14.22   ? Time 5   ? Period Weeks   ? Status New   ? ?  ?  ? ?  ? ? ? ? ? ? ? ? Plan - 07/25/21 0938   ? ? Clinical Impression Statement The patient demonstrated improved postural awareness throughout session.  PT progressed dynamic balance and patient has significant losses of balance with vertical head motion on rocker board requiring min to mod A to recover.  PT worked on backward walking and blanace strategies to help improve reactive balance strategies.   ? Personal Factors and Comorbidities Comorbidity 3+   ?  Comorbidities See problem list   ? Examination-Activity Limitations Locomotion  Level;Stand   ? Examination-Participation Restrictions Community Activity   ? Rehab Potential Good   ? PT Frequency Other (comment)   1x/week for 1 week, then 2x/week for 4 weeks  ? PT Duration Other (comment)   total POC=5 weeks  ? PT Treatment/Interventions ADLs/Self Care Home Management;Gait training;Stair training;Functional mobility training;Therapeutic activities;Therapeutic exercise;Balance training;Neuromuscular re-education;Patient/family education   ? PT Next Visit Plan REview HEP-PWR! Moves standing, quadruped; balance exercises-compliant surface-EO head motions, EC, step strategy work   ? Consulted and Agree with Plan of Care Patient   ? ?  ?  ? ?  ? ? ?Patient will benefit from skilled therapeutic intervention in order to improve the following deficits and impairments:  Abnormal gait, Difficulty walking, Decreased balance, Decreased mobility, Postural dysfunction ? ?Visit Diagnosis: ?Unsteadiness on feet ? ?Abnormal posture ? ?Other abnormalities of gait and mobility ? ? ? ? ?Problem List ?Patient Active Problem List  ? Diagnosis Date Noted  ? Constipation 03/24/2021  ? Gout 03/23/2021  ? Primary renal papillary carcinoma, right ([place]) 02/18/2020  ? Hepatic steatosis 02/18/2020  ? Major depression in remission ([place]) 11/11/2019  ? Central sleep apnea due to Cheyne-Stokes respiration 08/31/2019  ? Hyperlipidemia associated with type 2 diabetes mellitus ([place]) 07/09/2019  ? Aortic atherosclerosis ([place]) by CXR on 06/10/2018 06/10/2018  ? Type 2 diabetes mellitus with stage 3a chronic kidney disease, without long-term current use of insulin ([place]) 01/21/2018  ? OSA treated with BiPAP 01/03/2017  ? REM sleep behavior disorder 12/06/2016  ? CKD stage 3 due to type 2 diabetes mellitus ([place]) 10/23/2016  ? Medication management 10/23/2016  ? Vitamin D deficiency 10/23/2016  ? Parkinson's disease ([place]) 01/04/2016  ? Seasonal and perennial allergic rhinitis 06/21/2014  ? Asthma, mild intermittent, well-controlled  06/21/2014  ? Pseudomonas aeruginosa colonization 10/08/2012  ? CAD (coronary artery disease) 11/15/2011  ? Essential hypertension 11/15/2011  ? Morbid obesity with BMI of 40.0-44.9, adult ([place]) 11/15/2011

## 2021-07-26 NOTE — Telephone Encounter (Signed)
LM-07/26/21-Called patient at [phone] and r/s cpp visit to 12/06/21 at 3:00PM via telephone. ? ?Total time spent: 4 Minutes ?Vanetta Shawl, [facility] ?

## 2021-07-27 ENCOUNTER — Encounter: Payer: Self-pay | Admitting: Adult Health

## 2021-07-28 ENCOUNTER — Ambulatory Visit: Payer: Medicare Other

## 2021-07-28 ENCOUNTER — Ambulatory Visit: Payer: Medicare Other | Admitting: Physical Therapy

## 2021-07-28 ENCOUNTER — Other Ambulatory Visit: Payer: Self-pay

## 2021-07-28 ENCOUNTER — Encounter: Payer: Self-pay | Admitting: Physical Therapy

## 2021-07-28 DIAGNOSIS — R2681 Unsteadiness on feet: Secondary | ICD-10-CM | POA: Diagnosis not present

## 2021-07-28 DIAGNOSIS — G20 Parkinson's disease: Secondary | ICD-10-CM | POA: Diagnosis not present

## 2021-07-28 DIAGNOSIS — R1310 Dysphagia, unspecified: Secondary | ICD-10-CM | POA: Diagnosis not present

## 2021-07-28 DIAGNOSIS — R293 Abnormal posture: Secondary | ICD-10-CM | POA: Diagnosis not present

## 2021-07-28 DIAGNOSIS — R2689 Other abnormalities of gait and mobility: Secondary | ICD-10-CM

## 2021-07-28 DIAGNOSIS — R471 Dysarthria and anarthria: Secondary | ICD-10-CM

## 2021-07-28 DIAGNOSIS — R29818 Other symptoms and signs involving the nervous system: Secondary | ICD-10-CM | POA: Diagnosis not present

## 2021-07-28 DIAGNOSIS — R41841 Cognitive communication deficit: Secondary | ICD-10-CM

## 2021-07-28 NOTE — Therapy (Addendum)
[place] [facility] ?[address], [address] ?[place], [place], [address] ?Phone: [phone]   Fax:  [phone] ? ?Physical Therapy Treatment ? ?Patient Details  ?Name: Todd Rice ?MRN: [id number] ?Date of Birth: [birth year] ?Referring Provider (PT): Penumalli/[place] ? ? ?Encounter Date: 07/28/2021 ? ? PT End of Session - 07/28/21 0908   ? ? Visit Number 4   ? Number of Visits 10   ? Date for PT Re-Evaluation 08/12/21   ? Authorization Type [employer] Medicare   ? PT Start Time [phone]   ? PT Stop Time 0930   ? PT Time Calculation (min) 38 min   ? Activity Tolerance Patient tolerated treatment well   ? Behavior During Therapy [facility] for tasks assessed/performed   ? ?  ?  ? ?  ? ? ?Past Medical History:  ?Diagnosis Date  ? Adult BMI 50.0-59.9 kg/sq m   ? 52.77  ? Anal fissure   ? Anxiety   ? Diabetes mellitus without complication ([place])   ? borderline  ? Elevated PSA 08/13/2019  ? GERD (gastroesophageal reflux disease)   ? 30 years ago, maybe from peptic ulcer  ? H/O: gout   ? STABLE  ? Hepatic steatosis 10/01/11  ? History of kidney stones   ? Hyperlipidemia   ? Hypertension   ? Kidney tumor   ? right upper kidney tumor, monitoring  ? OSA on CPAP   ? [employer] [facility]  ? Parkinson disease ([place]) 01/04/2016  ? REM sleep behavior disorder 12/06/2016  ? Right ureteral stone   ? Ureteral calculus, right 06/12/2011  ? Vitamin D deficiency   ? Weakness   ? ? ?Past Surgical History:  ?Procedure Laterality Date  ? ANAL FISSURE REPAIR  10-12-2000  ? COLONOSCOPY N/A 07/01/2012  ? Procedure: COLONOSCOPY;  Surgeon: Lafayette Dragon, MD;  Location: [facility];  Service: Endoscopy;  Laterality: N/A;  ? CYSTOSCOPY WITH RETROGRADE PYELOGRAM, URETEROSCOPY AND STENT PLACEMENT Bilateral 02/26/2013  ? Procedure: CYSTOSCOPY WITH RETROGRADE PYELOGRAM, URETEROSCOPY AND STENT PLACEMENT;  Surgeon: Alexis Frock, MD;  Location: [facility];  Service: Urology;  Laterality: Bilateral;  ? HOLMIUM LASER APPLICATION Bilateral 41/66/0630  ?  Procedure: HOLMIUM LASER APPLICATION;  Surgeon: Alexis Frock, MD;  Location: [facility];  Service: Urology;  Laterality: Bilateral;  ? kidney stone removal    ? 06/2011-stent placement  ? LEFT MEDIAL FEMORAL CONDYLE DEBRIDEMENT & DRILLING/ REMOVAL LOOSE BODY  09-15-2003  ? NASAL SEPTUM SURGERY  1985  ? STONE EXTRACTION WITH BASKET  06/12/2011  ? Procedure: STONE EXTRACTION WITH BASKET;  Surgeon: Claybon Jabs, MD;  Location[facility];  Service: Urology;  Laterality: Right;  ? ? ?There were no vitals filed for this visit. ? ? Subjective Assessment - 07/28/21 0854   ? ? Subjective Nothing new.  Feel better.   ? Patient Stated Goals Pt's goal is to get my mind right about my Parkinson's.   ? Currently in Pain? No/denies   ? ?  ?  ? ?  ? ? ? ? ? ? ? ? ?Reviewed HEP with standing PWR! Moves, return demo understanding with cues.  Pt reports doing 3-4x/wk. ? ?Pt performs PWR! Moves in standing position x 20 reps ?  ?PWR! Up for improved posture ? ?PWR! Rock for improved weighshifting ? ?PWR! Twist for improved trunk rotation  ? ?PWR! Step for improved step initiation  ? ?Minimal Cues provided for technique ? ? ? ? ? ? ? ? ? ? ? ?  [facility] Adult PT Treatment/Exercise - 07/28/21 0001   ? ?  ? Neuro Re-ed   ? Neuro Re-ed Details  Ankle, hip and stepping strategies working on posterior to midline near wall for ankle, heel/toe raises x 20, wall bumps x 10, wide steps off foam laterally x 10, foward steps off/on foam x 10, and posterior stepping off/on foam x 10.  Posterior stepping during large amplitude walking backwards.  Foam standing eyes open/closed, stepping off/on of foam with CGA.  Rocker board (lateral and ant/posterior) with ankle/hip strategy, then with eyes open head horiz and vertical turns.   Rocker board with arm swing for strategies.   ? ?  ?  ? ?  ? ? ? ? ? ? Balance Exercises - 07/28/21 0001   ? ?  ? Balance Exercises: Standing  ? Other Standing Exercises Sidestep diagonals to cones, with coordinated  UE movements.  Cues for wide swing of foot to improve foot clearance with direction changes.  Forward/back walking with cues for increased step length, "hinge" at hips in posterior direction.  Added dual task to pick up cones.   ? ?  ?  ? ?  ? ? ? ? ? ? ? PT Short Term Goals - 07/11/21 1149   ? ?  ? PT SHORT TERM GOAL #1  ? Title Pt will be independent with HEP for improved balance and gait.  TARGET 07/29/2021   ? Time 2   ? Period Weeks   ? Status New   ? ?  ?  ? ?  ? ? ? ? PT Long Term Goals - 07/11/21 1150   ? ?  ? PT LONG TERM GOAL #1  ? Title Pt will independent with progression of HEP to address Parkinson's deficits for improved balance and gait.  TARGET 08/12/2021   ? Time 5   ? Period Weeks   ? Status New   ?  ? PT LONG TERM GOAL #2  ? Title Pt will perform stnading eyes closed on foam with minimal to no sway for improved balance.   ? Time 5   ? Period Weeks   ? Status New   ?  ? PT LONG TERM GOAL #3  ? Title Pt will improve forward push and release to 1 step or less for improved balance recovery.   ? Time 5   ? Period Weeks   ? Status New   ?  ? PT LONG TERM GOAL #4  ? Title Pt will improve TUG/TUG cognitive score to less than or equal to 10% difference for improved dual tasking with gait.   ? Baseline TUG 10.91, cog 14.22   ? Time 5   ? Period Weeks   ? Status New   ? ?  ?  ? ?  ? ? ? ? ? ? ? ? Plan - 07/28/21 0909   ? ? Clinical Impression Statement Skilled PT session focused on large amplitude movements, with ankle, hip, step strategies, on solid and compliant surfaces.  Pt with improved head motion with standing on rockerboard, wtih light UE support.  Balance challenges on compliant surfaces bring on increased RUE tremors.   ? Personal Factors and Comorbidities Comorbidity 3+   ? Comorbidities See problem list   ? Examination-Activity Limitations Locomotion Level;Stand   ? Examination-Participation Restrictions Community Activity   ? Rehab Potential Good   ? PT Frequency Other (comment)   1x/week for 1  week, then 2x/week for 4 weeks  ?  PT Duration Other (comment)   total POC=5 weeks  ? PT Treatment/Interventions ADLs/Self Care Home Management;Gait training;Stair training;Functional mobility training;Therapeutic activities;Therapeutic exercise;Balance training;Neuromuscular re-education;Patient/family education   ? PT Next Visit Plan Try PWR! Moves standing on compliant surface and PWR! Moves quadruped; balance exercises-compliant surface-EO head motions, EC, step strategy work   ? Consulted and Agree with Plan of Care Patient   ? ?  ?  ? ?  ? ? ?Patient will benefit from skilled therapeutic intervention in order to improve the following deficits and impairments:  Abnormal gait, Difficulty walking, Decreased balance, Decreased mobility, Postural dysfunction ? ?Visit Diagnosis: ?Unsteadiness on feet ? ?Other abnormalities of gait and mobility ? ? ? ? ?Problem List ?Patient Active Problem List  ? Diagnosis Date Noted  ? Constipation 03/24/2021  ? Gout 03/23/2021  ? Primary renal papillary carcinoma, right ([place]) 02/18/2020  ? Hepatic steatosis 02/18/2020  ? Major depression in remission ([place]) 11/11/2019  ? Central sleep apnea due to Cheyne-Stokes respiration 08/31/2019  ? Hyperlipidemia associated with type 2 diabetes mellitus ([place]) 07/09/2019  ? Aortic atherosclerosis ([place]) by CXR on 06/10/2018 06/10/2018  ? Type 2 diabetes mellitus with stage 3a chronic kidney disease, without long-term current use of insulin ([place]) 01/21/2018  ? OSA treated with BiPAP 01/03/2017  ? REM sleep behavior disorder 12/06/2016  ? CKD stage 3 due to type 2 diabetes mellitus ([place]) 10/23/2016  ? Medication management 10/23/2016  ? Vitamin D deficiency 10/23/2016  ? Parkinson's disease ([place]) 01/04/2016  ? Seasonal and perennial allergic rhinitis 06/21/2014  ? Asthma, mild intermittent, well-controlled 06/21/2014  ? Pseudomonas aeruginosa colonization 10/08/2012  ? CAD (coronary artery disease) 11/15/2011  ? Essential hypertension 11/15/2011  ?  Morbid obesity with BMI of 40.0-44.9, adult ([place]) 11/15/2011  ? ? ?Elwanda Moger W., PT ?07/28/2021, 9:30 AM ? ?[place] ?[facility] ?[address], [address] ?[place], [place]

## 2021-07-28 NOTE — Therapy (Signed)
[place] [facility] ?[address], [address] ?[place], [place], [address] ?Phone: [phone]   Fax:  [phone] ? ?Speech Language Pathology Treatment ? ?Patient Details  ?Name: Todd Rice ?MRN: [id number] ?Date of Birth: 1955/09/01 ?Referring Provider (SLP): Seymour Bars, NP ? ? ?Encounter Date: 07/28/2021 ? ? End of Session - 07/28/21 0946   ? ? Visit Number 4   ? Number of Visits 17   ? Date for SLP Re-Evaluation 09/19/21   ? SLP Start Time [phone]   ? SLP Stop Time  0845   ? SLP Time Calculation (min) 41 min   ? Activity Tolerance Patient tolerated treatment well   ? ?  ?  ? ?  ? ? ?Past Medical History:  ?Diagnosis Date  ? Adult BMI 50.0-59.9 kg/sq m   ? 52.77  ? Anal fissure   ? Anxiety   ? Diabetes mellitus without complication ([place])   ? borderline  ? Elevated PSA 08/13/2019  ? GERD (gastroesophageal reflux disease)   ? 30 years ago, maybe from peptic ulcer  ? H/O: gout   ? STABLE  ? Hepatic steatosis 10/01/11  ? History of kidney stones   ? Hyperlipidemia   ? Hypertension   ? Kidney tumor   ? right upper kidney tumor, monitoring  ? OSA on CPAP   ? [employer] [facility]  ? Parkinson disease ([place]) 01/04/2016  ? REM sleep behavior disorder 12/06/2016  ? Right ureteral stone   ? Ureteral calculus, right 06/12/2011  ? Vitamin D deficiency   ? Weakness   ? ? ?Past Surgical History:  ?Procedure Laterality Date  ? ANAL FISSURE REPAIR  10-12-2000  ? COLONOSCOPY N/A 07/01/2012  ? Procedure: COLONOSCOPY;  Surgeon: Lafayette Dragon, MD;  [facility];  Service: Endoscopy;  Laterality: N/A;  ? CYSTOSCOPY WITH RETROGRADE PYELOGRAM, URETEROSCOPY AND STENT PLACEMENT Bilateral 02/26/2013  ? Procedure: CYSTOSCOPY WITH RETROGRADE PYELOGRAM, URETEROSCOPY AND STENT PLACEMENT;  Surgeon: Alexis Frock, MD;  [facility]: [facility];  Service: Urology;  Laterality: Bilateral;  ? HOLMIUM LASER APPLICATION Bilateral 18/84/1660  ? Procedure: HOLMIUM LASER APPLICATION;  Surgeon: Alexis Frock, MD;  Location: [facility];   Service: Urology;  Laterality: Bilateral;  ? kidney stone removal    ? 06/2011-stent placement  ? LEFT MEDIAL FEMORAL CONDYLE DEBRIDEMENT & DRILLING/ REMOVAL LOOSE BODY  09-15-2003  ? NASAL SEPTUM SURGERY  1985  ? STONE EXTRACTION WITH BASKET  06/12/2011  ? Procedure: STONE EXTRACTION WITH BASKET;  Surgeon: Claybon Jabs, MD;  Location: [facility];  Service: Urology;  Laterality: Right;  ? ? ?There were no vitals filed for this visit. ? ? Subjective Assessment - 07/28/21 0819   ? ? Subjective "Brought my sentences."   ? Currently in Pain? No/denies   ? ?  ?  ? ?  ? ? ? ? ? ? ? ? ADULT SLP TREATMENT - 07/28/21 0825   ? ?  ? General Information  ? Behavior/Cognition Alert;Cooperative;Pleasant mood   ?  ? Treatment Provided  ? Treatment provided Cognitive-Linquistic   ?  ? Cognitive-Linquistic Treatment  ? Treatment focused on Dysarthria   ? Skilled Treatment [name] brought his remaining everyday sentences to ST today, that would be said everyday. SLP then used loud /a/ to habitualize WNL conversational volume and pt averaged low-mid 90s dB.  Next, SLP had pt  produce everyday sentences with average mid-80s dB. SLP moved to pt practice of louder speech in 2-3 sentence responses -  pt req'd usual mod cues initially, faded to occasional min cues for louder speech.   ?  ? Assessment / Recommendations / Plan  ? Plan Continue with current plan of care   ?  ? Progression Toward Goals  ? Progression toward goals Progressing toward goals   ? ?  ?  ? ?  ? ? ? ? ? SLP Short Term Goals - 07/28/21 0947   ? ?  ? SLP SHORT TERM GOAL #1  ? Title pt will maintain loud /a/ average mid 90s dB with appropriate voicing over 4 sessions   ? Time 4   ? Period Weeks   ? Status On-going   ? Target Date 08/12/21   ?  ? SLP SHORT TERM GOAL #2  ? Title pt will produce 20/20 sentences with average 70dB over two sessions   ? Time 4   ? Period Weeks   ? Status On-going   ? Target Date 08/12/21   ?  ? SLP SHORT TERM GOAL #3  ? Title pt  will demo simple conversation of 10 minutes with average 70dB over two sessions   ? Time 4   ? Period Weeks   ? Status On-going   ? Target Date 08/12/21   ?  ? SLP SHORT TERM GOAL #4  ? Title pt will undergo cognitive linguistic evaluation and/or instrumental/objective swallow assessment PRN   ? Time 4   ? Period Weeks   ? Status On-going   ? Target Date 08/12/21   ? ?  ?  ? ?  ? ? ? SLP Long Term Goals - 07/28/21 0947   ? ?  ? SLP LONG TERM GOAL #1  ? Title pt will maintain loud /a/ at mid 90s dB with proper voicing over 6 sessions   ? Time 8   ? Period Weeks   ? Status On-going   ? Target Date 09/16/21   ?  ? SLP LONG TERM GOAL #2  ? Title pt will demo 15 minutes mod complex conversastion with average low 70sdB given rare nonverbal cues over three sessions   ? Time 8   ? Period Weeks   ? Status On-going   ? Target Date 09/16/21   ?  ? SLP LONG TERM GOAL #3  ? Title in the last 1-2 visits pt will verify less-frequent "losing my train of thought" compared to prior to [place]   ? Time 8   ? Period Weeks   ? Status On-going   ? Target Date 09/16/21   ?  ? SLP LONG TERM GOAL #4  ? Title pt will use abdominal breathing 80% of the time in 15 minutes mod complex conversation in 3 sessions   ? Time 8   ? Period Weeks   ? Status On-going   ? Target Date 09/16/21   ?  ? SLP LONG TERM GOAL #5  ? Title pt will score higher than his initial score on a communication PROM in last 1-2 sessions   ? Baseline 24/30   ? Time 8   ? Period Weeks   ? Status On-going   ? Target Date 09/16/21   ? ?  ?  ? ?  ? ? ? Plan - 07/28/21 0946   ? ? 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 Pt reports WNL eating/drinking, denying overt s/s aspiration during meals, however pt may require objective swallow eval during this therapy  course (MBSS, FEES) and signature on this plan of care will be understood to include agreement with SLP for necessity of any objective/instrumental swallow assessment. SLP believes pt will benefit from skilled ST targeting incr'd volume, possibly some cognitive communication therapy to address attention in conversation, and greater usage of abdomoinal breathing in order to improve communicative effectiveness.   ? Speech Therapy Frequency 2x / week   ? Duration 8 weeks   ? Treatment/Interventions Environmental controls;Compensatory techniques;Functional tasks;Multimodal communcation approach;Cueing hierarchy;Language facilitation;Cognitive reorganization;Internal/external aids;Patient/family education;SLP instruction and feedback;Aspiration precaution training;Pharyngeal strengthening exercises;Oral motor exercises   ? Potential to Achieve Goals Good   ? SLP Home Exercise Plan provided   ? Consulted and Agree with Plan of Care Patient   ? ?  ?  ? ?  ? ? ?Patient will benefit from skilled therapeutic intervention in order to improve the following deficits and impairments:   ?Dysarthria and anarthria ? ?Dysphagia, unspecified type ? ?Cognitive communication deficit ? ? ? ?Problem List ?Patient Active Problem List  ? Diagnosis Date Noted  ? Constipation 03/24/2021  ? Gout 03/23/2021  ? Primary renal papillary carcinoma, right ([place]) 02/18/2020  ? Hepatic steatosis 02/18/2020  ? Major depression in remission ([place]) 11/11/2019  ? Central sleep apnea due to Cheyne-Stokes respiration 08/31/2019  ? Hyperlipidemia associated with type 2 diabetes mellitus ([place]) 07/09/2019  ? Aortic atherosclerosis ([place]) by CXR on 06/10/2018 06/10/2018  ? Type 2 diabetes mellitus with stage 3a chronic kidney disease, without long-term current use of insulin ([place]) 01/21/2018  ? OSA treated with BiPAP 01/03/2017  ? REM sleep behavior disorder 12/06/2016  ? CKD stage 3 due to type 2 diabetes mellitus ([place]) 10/23/2016  ? Medication management  10/23/2016  ? Vitamin D deficiency 10/23/2016  ? Parkinson's disease ([place]) 01/04/2016  ? Seasonal and perennial allergic rhinitis 06/21/2014  ? Asthma, mild intermittent, well-controlled 06/21/2014  ? Pseudomon

## 2021-08-01 NOTE — Telephone Encounter (Signed)
Spoke with patient. Came in today to drop off his paperwork. Will have provider sign them and then it will be faxed.  ?

## 2021-08-02 ENCOUNTER — Ambulatory Visit: Payer: Medicare Other | Admitting: Diagnostic Neuroimaging

## 2021-08-02 ENCOUNTER — Ambulatory Visit: Payer: Medicare Other

## 2021-08-02 ENCOUNTER — Ambulatory Visit: Payer: Medicare Other | Admitting: Physical Therapy

## 2021-08-02 ENCOUNTER — Other Ambulatory Visit: Payer: Self-pay

## 2021-08-02 DIAGNOSIS — R471 Dysarthria and anarthria: Secondary | ICD-10-CM

## 2021-08-02 DIAGNOSIS — R293 Abnormal posture: Secondary | ICD-10-CM

## 2021-08-02 DIAGNOSIS — R1310 Dysphagia, unspecified: Secondary | ICD-10-CM

## 2021-08-02 DIAGNOSIS — G20 Parkinson's disease: Secondary | ICD-10-CM | POA: Diagnosis not present

## 2021-08-02 DIAGNOSIS — R29818 Other symptoms and signs involving the nervous system: Secondary | ICD-10-CM | POA: Diagnosis not present

## 2021-08-02 DIAGNOSIS — R2681 Unsteadiness on feet: Secondary | ICD-10-CM | POA: Diagnosis not present

## 2021-08-02 DIAGNOSIS — R41841 Cognitive communication deficit: Secondary | ICD-10-CM

## 2021-08-02 DIAGNOSIS — R2689 Other abnormalities of gait and mobility: Secondary | ICD-10-CM

## 2021-08-02 NOTE — Therapy (Signed)
[place] [facility] ?[address], [address] ?[place], [place], [address] ?Phone: [phone]   Fax:  [phone] ? ?Speech Language Pathology Treatment ? ?Patient Details  ?Name: Todd Rice ?MRN: [id number] ?Date of Birth: Oct 05, 1955 ?Referring Provider (SLP): Todd Bars, NP ? ? ?Encounter Date: 08/02/2021 ? ? End of Session - 08/02/21 0934   ? ? Visit Number 5   ? Number of Visits 17   ? Date for SLP Re-Evaluation 09/19/21   ? SLP Start Time [phone]   ? SLP Stop Time  0930   ? SLP Time Calculation (min) 43 min   ? Activity Tolerance Patient tolerated treatment well   ? ?  ?  ? ?  ? ? ?Past Medical History:  ?Diagnosis Date  ? Adult BMI 50.0-59.9 kg/sq m   ? 52.77  ? Anal fissure   ? Anxiety   ? Diabetes mellitus without complication ([place])   ? borderline  ? Elevated PSA 08/13/2019  ? GERD (gastroesophageal reflux disease)   ? 30 years ago, maybe from peptic ulcer  ? H/O: gout   ? STABLE  ? Hepatic steatosis 10/01/11  ? History of kidney stones   ? Hyperlipidemia   ? Hypertension   ? Kidney tumor   ? right upper kidney tumor, monitoring  ? OSA on CPAP   ? [employer] [facility]  ? Parkinson disease ([place]) 01/04/2016  ? REM sleep behavior disorder 12/06/2016  ? Right ureteral stone   ? Ureteral calculus, right 06/12/2011  ? Vitamin D deficiency   ? Weakness   ? ? ?Past Surgical History:  ?Procedure Laterality Date  ? ANAL FISSURE REPAIR  10-12-2000  ? COLONOSCOPY N/A 07/01/2012  ? Procedure: COLONOSCOPY;  Surgeon: Lafayette Dragon, MD;  Location: [facility];  Service: Endoscopy;  Laterality: N/A;  ? CYSTOSCOPY WITH RETROGRADE PYELOGRAM, URETEROSCOPY AND STENT PLACEMENT Bilateral 02/26/2013  ? Procedure: CYSTOSCOPY WITH RETROGRADE PYELOGRAM, URETEROSCOPY AND STENT PLACEMENT;  Surgeon: Alexis Frock, MD;  Location: [facility];  Service: Urology;  Laterality: Bilateral;  ? HOLMIUM LASER APPLICATION Bilateral 28/00/3491  ? Procedure: HOLMIUM LASER APPLICATION;  Surgeon: Alexis Frock, MD;  Location: [facility];   Service: Urology;  Laterality: Bilateral;  ? kidney stone removal    ? 06/2011-stent placement  ? LEFT MEDIAL FEMORAL CONDYLE DEBRIDEMENT & DRILLING/ REMOVAL LOOSE BODY  09-15-2003  ? NASAL SEPTUM SURGERY  1985  ? STONE EXTRACTION WITH BASKET  06/12/2011  ? Procedure: STONE EXTRACTION WITH BASKET;  Surgeon: Claybon Jabs, MD;  Location: [facility];  Service: Urology;  Laterality: Right;  ? ? ?There were no vitals filed for this visit. ? ? Subjective Assessment - 08/02/21 0902   ? ? Subjective "You have the sentences correct?"   ? Currently in Pain? No/denies   ? ?  ?  ? ?  ? ? ? ? ? ? ? ? ADULT SLP TREATMENT - 08/02/21 0001   ? ?  ? General Information  ? Behavior/Cognition Alert;Cooperative;Pleasant mood   ?  ? Treatment Provided  ? Treatment provided Cognitive-Linquistic   ?  ? Cognitive-Linquistic Treatment  ? Treatment focused on Dysarthria   ? Skilled Treatment SLP used loud /a/ to habitualize WNL conversational volume and pt averaged mid 90s dB. Next, SLP had pt  produce everyday sentences with average mid-80s dB. SLP moved to pt practice of louder speech inconversation - pt averaged low 70s for approx 6 minutes and then decr'd to sub-optimal loudness. With SLP  cue pt raised volume again into WNL for the remainder of the conversation (3 minutes). Second conversation pt maintained WNL volume for 5 minutes and incr'd volume after that with min SLP cues for additinal 4 minutes.   ?  ? Assessment / Recommendations / Plan  ? Plan Continue with current plan of care   ?  ? Progression Toward Goals  ? Progression toward goals Progressing toward goals   ? ?  ?  ? ?  ? ? ? ? ? SLP Short Term Goals - 08/02/21 0934   ? ?  ? SLP SHORT TERM GOAL #1  ? Title pt will maintain loud /a/ average mid 90s dB with appropriate voicing over 4 sessions   ? Baseline 08-02-21   ? Time 4   ? Period Weeks   ? Status On-going   ? Target Date 08/12/21   ?  ? SLP SHORT TERM GOAL #2  ? Title pt will produce 20/20 sentences with  average 70dB over two sessions   ? Baseline 08-02-21   ? Time 4   ? Period Weeks   ? Status On-going   ? Target Date 08/12/21   ?  ? SLP SHORT TERM GOAL #3  ? Title pt will demo simple conversation of 10 minutes with average 70dB over two sessions   ? Time 4   ? Period Weeks   ? Status On-going   ? Target Date 08/12/21   ?  ? SLP SHORT TERM GOAL #4  ? Title pt will undergo cognitive linguistic evaluation and/or instrumental/objective swallow assessment PRN   ? Time 4   ? Period Weeks   ? Status On-going   ? Target Date 08/12/21   ? ?  ?  ? ?  ? ? ? SLP Long Term Goals - 08/02/21 0935   ? ?  ? SLP LONG TERM GOAL #1  ? Title pt will maintain loud /a/ at mid 90s dB with proper voicing over 6 sessions   ? Time 8   ? Period Weeks   ? Status On-going   ? Target Date 09/16/21   ?  ? SLP LONG TERM GOAL #2  ? Title pt will demo 15 minutes mod complex conversastion with average low 70sdB given rare nonverbal cues over three sessions   ? Time 8   ? Period Weeks   ? Status On-going   ? Target Date 09/16/21   ?  ? SLP LONG TERM GOAL #3  ? Title in the last 1-2 visits pt will verify less-frequent "losing my train of thought" compared to prior to [place]   ? Time 8   ? Period Weeks   ? Status On-going   ? Target Date 09/16/21   ?  ? SLP LONG TERM GOAL #4  ? Title pt will use abdominal breathing 80% of the time in 15 minutes mod complex conversation in 3 sessions   ? Time 8   ? Period Weeks   ? Status On-going   ? Target Date 09/16/21   ?  ? SLP LONG TERM GOAL #5  ? Title pt will score higher than his initial score on a communication PROM in last 1-2 sessions   ? Baseline 24/30   ? Time 8   ? Period Weeks   ? Status On-going   ? Target Date 09/16/21   ? ?  ?  ? ?  ? ? ? Plan - 08/02/21 0934   ? ? Clinical Impression Statement  Todd Rice presents today with cont'd mild-mod hypokinetic dysarthria due to parkinson's disease (PD), and reports  losing his train of thought more frequently the last 6-12 months.See "skilled intervention" for more  details of today's session. Pt reports WNL eating/drinking, denying overt s/s aspiration during meals, however pt may require objective swallow eval during this therapy course (MBSS, FEES) and signature on this plan of care will be understood to include agreement with SLP for necessity of any objective/instrumental swallow assessment. SLP believes pt will benefit from skilled ST targeting incr'd volume, possibly some cognitive communication therapy to address attention in conversation, and greater usage of abdomoinal breathing in order to improve communicative effectiveness.   ? Speech Therapy Frequency 2x / week   ? Duration 8 weeks   ? Treatment/Interventions Environmental controls;Compensatory techniques;Functional tasks;Multimodal communcation approach;Cueing hierarchy;Language facilitation;Cognitive reorganization;Internal/external aids;Patient/family education;SLP instruction and feedback;Aspiration precaution training;Pharyngeal strengthening exercises;Oral motor exercises   ? Potential to Achieve Goals Good   ? SLP Home Exercise Plan provided   ? Consulted and Agree with Plan of Care Patient   ? ?  ?  ? ?  ? ? ?Patient will benefit from skilled therapeutic intervention in order to improve the following deficits and impairments:   ?Dysarthria and anarthria ? ?Dysphagia, unspecified type ? ?Cognitive communication deficit ? ? ? ?Problem List ?Patient Active Problem List  ? Diagnosis Date Noted  ? Constipation 03/24/2021  ? Gout 03/23/2021  ? Primary renal papillary carcinoma, right ([place]) 02/18/2020  ? Hepatic steatosis 02/18/2020  ? Major depression in remission ([place]) 11/11/2019  ? Central sleep apnea due to Cheyne-Stokes respiration 08/31/2019  ? Hyperlipidemia associated with type 2 diabetes mellitus ([place]) 07/09/2019  ? Aortic atherosclerosis ([place]) by CXR on 06/10/2018 06/10/2018  ? Type 2 diabetes mellitus with stage 3a chronic kidney disease, without long-term current use of insulin ([place]) 01/21/2018  ? OSA  treated with BiPAP 01/03/2017  ? REM sleep behavior disorder 12/06/2016  ? CKD stage 3 due to type 2 diabetes mellitus ([place]) 10/23/2016  ? Medication management 10/23/2016  ? Vitamin D deficiency 06/18/

## 2021-08-02 NOTE — Therapy (Signed)
[place] [facility] ?[address], [address] ?[place], [place], [address] ?Phone: [phone]   Fax:  [phone] ? ?Physical Therapy Treatment ? ?Patient Details  ?Name: Todd Rice ?MRN: [id number] ?Date of Birth: [birth year] ?Referring Provider (PT): [place] ? ? ?Encounter Date: 08/02/2021 ? ? PT End of Session - 08/02/21 0757   ? ? Visit Number 5   ? Number of Visits 10   ? Date for PT Re-Evaluation 08/12/21   ? Authorization Type [employer] Medicare   ? PT Start Time 0802   ? PT Stop Time 0844   ? PT Time Calculation (min) 42 min   ? Activity Tolerance Patient tolerated treatment well   ? Behavior During Therapy [facility] for tasks assessed/performed   ? ?  ?  ? ?  ? ? ?Past Medical History:  ?Diagnosis Date  ? Adult BMI 50.0-59.9 kg/sq m   ? 52.77  ? Anal fissure   ? Anxiety   ? Diabetes mellitus without complication ([place])   ? borderline  ? Elevated PSA 08/13/2019  ? GERD (gastroesophageal reflux disease)   ? 30 years ago, maybe from peptic ulcer  ? H/O: gout   ? STABLE  ? Hepatic steatosis 10/01/11  ? History of kidney stones   ? Hyperlipidemia   ? Hypertension   ? Kidney tumor   ? right upper kidney tumor, monitoring  ? OSA on CPAP   ? [employer] [facility]  ? Parkinson disease ([place]) 01/04/2016  ? REM sleep behavior disorder 12/06/2016  ? Right ureteral stone   ? Ureteral calculus, right 06/12/2011  ? Vitamin D deficiency   ? Weakness   ? ? ?Past Surgical History:  ?Procedure Laterality Date  ? ANAL FISSURE REPAIR  10-12-2000  ? COLONOSCOPY N/A 07/01/2012  ? Procedure: COLONOSCOPY;  Surgeon: Lafayette Dragon, MD;  [facility];  Service: Endoscopy;  Laterality: N/A;  ? CYSTOSCOPY WITH RETROGRADE PYELOGRAM, URETEROSCOPY AND STENT PLACEMENT Bilateral 02/26/2013  ? Procedure: CYSTOSCOPY WITH RETROGRADE PYELOGRAM, URETEROSCOPY AND STENT PLACEMENT;  Surgeon: Alexis Frock, MD;  [facility];  Service: Urology;  Laterality: Bilateral;  ? HOLMIUM LASER APPLICATION Bilateral 40/81/4481  ?  Procedure: HOLMIUM LASER APPLICATION;  Surgeon: Alexis Frock, MD;  [facility];  Service: Urology;  Laterality: Bilateral;  ? kidney stone removal    ? 06/2011-stent placement  ? LEFT MEDIAL FEMORAL CONDYLE DEBRIDEMENT & DRILLING/ REMOVAL LOOSE BODY  09-15-2003  ? NASAL SEPTUM SURGERY  1985  ? STONE EXTRACTION WITH BASKET  06/12/2011  ? Procedure: STONE EXTRACTION WITH BASKET;  Surgeon: Claybon Jabs, MD;  [facility];  Service: Urology;  Laterality: Right;  ? ? ?There were no vitals filed for this visit. ? ? Subjective Assessment - 08/02/21 0757   ? ? Subjective Nothing new.   ? Patient Stated Goals Pt's goal is to get my mind right about my Parkinson's.   ? Currently in Pain? No/denies   ? ?  ?  ? ?  ? ? ? ? ? ? ? ? ? ? ? ? ? ? ? ? ? ? ? ? [place] Adult PT Treatment/Exercise - 08/02/21 0001   ? ?  ? Transfers  ? Transfers Sit to Stand;Stand to Sit   ? Sit to Stand 6: Modified independent (Device/Increase time);Without upper extremity assist;From chair/3-in-1   ? Stand to Sit 6: Modified independent (Device/Increase time);Without upper extremity assist;To chair/3-in-1   ? Number of Reps 10 reps;2 sets   ?  Comments 1st set from chair, 2nd set standing on Airex   ?  ? Ambulation/Gait  ? Ambulation/Gait Yes   ? Ambulation/Gait Assistance 6: Modified independent (Device/Increase time)   ? Ambulation Distance (Feet) 400 Feet   ? Assistive device None   ? Gait Pattern Step-through pattern;Decreased arm swing - right   ? Ambulation Surface Level;Indoor;Unlevel;Outdoor;Paved   ? Pre-Gait Activities Stagger stance rocking and rock/reach with added arm swing.   ? Gait Comments Outdoor gait with cues for foot clearance/arm swing.   ? ?  ?  ? ?  ? ?Pt performs PWR! Moves in standing position x 10 reps, standing on Airex ?  ?PWR! Up for improved posture ?  ?PWR! Rock for improved weighshifting ?  ?PWR! Twist for improved trunk rotation  ?  ?PWR! Step for improved step initiation-cues for foot  clearance and step length to clear foam pad ?  ?Minimal Cues provided for technique ? ? ? ? Balance Exercises - 08/02/21 0001   ? ?  ? Balance Exercises: Standing  ? Standing Eyes Opened Wide (BOA);Narrow base of support (BOS);Foam/compliant surface;Limitations   ? Standing Eyes Opened Limitations Head turns/head nods x 10 reps   ? Standing Eyes Closed Wide (BOA);Narrow base of support (BOS);Foam/compliant surface;Limitations   ? Standing Eyes Closed Limitations Head turns/nods x 5 EC   ? Marching Foam/compliant surface;Upper extremity assist 2;Upper extremity assist 1;20 reps   ? Marching Limitations Cues for increased step height and then progression to coordinated arm lifts   ? Other Standing Exercises Four-square step activity, low obstacle, then hurdles, with added coordinated UE movements.  Cues for foot placement and step length   ? ?  ?  ? ?  ? ? ? ? ? ? ? PT Short Term Goals - 08/02/21 0844   ? ?  ? PT SHORT TERM GOAL #1  ? Title Pt will be independent with HEP for improved balance and gait.  TARGET 07/29/2021   ? Time 2   ? Period Weeks   ? Status Achieved   ? ?  ?  ? ?  ? ? ? ? PT Long Term Goals - 07/11/21 1150   ? ?  ? PT LONG TERM GOAL #1  ? Title Pt will independent with progression of HEP to address Parkinson's deficits for improved balance and gait.  TARGET 08/12/2021   ? Time 5   ? Period Weeks   ? Status New   ?  ? PT LONG TERM GOAL #2  ? Title Pt will perform stnading eyes closed on foam with minimal to no sway for improved balance.   ? Time 5   ? Period Weeks   ? Status New   ?  ? PT LONG TERM GOAL #3  ? Title Pt will improve forward push and release to 1 step or less for improved balance recovery.   ? Time 5   ? Period Weeks   ? Status New   ?  ? PT LONG TERM GOAL #4  ? Title Pt will improve TUG/TUG cognitive score to less than or equal to 10% difference for improved dual tasking with gait.   ? Baseline TUG 10.91, cog 14.22   ? Time 5   ? Period Weeks   ? Status New   ? ?  ?  ? ?  ? ? ? ? ? ? ? ?  Plan - 08/02/21 0828   ? ? Clinical Impression Statement Continued skilled PT session focused  on large amplitude movement patterns on solid and compliant surfaces.  On Airex, pt is able to lessen or go without UE support, with eyes closed and partial tandem stance, he demonstrates increased forward lean and requires cues for activation through scapulae and gluts to help stabilize posture.  He will continue to benefit from skilled PT towards goals for improved functional mobility.   ? Personal Factors and Comorbidities Comorbidity 3+   ? Comorbidities See problem list   ? Examination-Activity Limitations Locomotion Level;Stand   ? Examination-Participation Restrictions Community Activity   ? Rehab Potential Good   ? PT Frequency Other (comment)   1x/week for 1 week, then 2x/week for 4 weeks  ? PT Duration Other (comment)   total POC=5 weeks  ? PT Treatment/Interventions ADLs/Self Care Home Management;Gait training;Stair training;Functional mobility training;Therapeutic activities;Therapeutic exercise;Balance training;Neuromuscular re-education;Patient/family education   ? PT Next Visit Plan Review HEP and upgrade as needed for balance.  EC and step strategy work.  Check LTGs next week and decide renew vs. d/c.  Discuss community fitness plans   ? Consulted and Agree with Plan of Care Patient   ? ?  ?  ? ?  ? ? ?Patient will benefit from skilled therapeutic intervention in order to improve the following deficits and impairments:  Abnormal gait, Difficulty walking, Decreased balance, Decreased mobility, Postural dysfunction ? ?Visit Diagnosis: ?Unsteadiness on feet ? ?Abnormal posture ? ?Other abnormalities of gait and mobility ? ? ? ? ?Problem List ?Patient Active Problem List  ? Diagnosis Date Noted  ? Constipation 03/24/2021  ? Gout 03/23/2021  ? Primary renal papillary carcinoma, right ([place]) 02/18/2020  ? Hepatic steatosis 02/18/2020  ? Major depression in remission ([place]) 11/11/2019  ? Central sleep apnea due to  Cheyne-Stokes respiration 08/31/2019  ? Hyperlipidemia associated with type 2 diabetes mellitus ([place]) 07/09/2019  ? Aortic atherosclerosis ([place]) by CXR on 06/10/2018 06/10/2018  ? Type 2 diabetes mellitus with sta

## 2021-08-04 ENCOUNTER — Ambulatory Visit: Payer: Medicare Other | Admitting: Adult Health

## 2021-08-04 ENCOUNTER — Encounter: Payer: Self-pay | Admitting: Adult Health

## 2021-08-04 VITALS — BP 145/81 | HR 79 | Ht 67.0 in | Wt 264.4 lb

## 2021-08-04 DIAGNOSIS — G4733 Obstructive sleep apnea (adult) (pediatric): Secondary | ICD-10-CM | POA: Diagnosis not present

## 2021-08-04 NOTE — Progress Notes (Addendum)
? ? ?PATIENT: Todd Rice ?DOB: [birth year] ? ?REASON FOR VISIT: follow up ?HISTORY FROM: patient ?PRIMARY NEUROLOGIST: Dr. Brett Fairy ? ?Chief Complaint  ?Patient presents with  ? Follow-up  ?  Pt in 4 pt is here for CPAP follow up  Pt states he has no questions or concerns for this visit   ? ? ? ?HISTORY OF PRESENT ILLNESS: ?Today 08/04/21: ? ?Todd Rice is a 66 year old male with a history of patient seen today for initial BiPAP compliance visit after receiving a new machine.  He was just seen in January for evaluation of Parkinson's.  His BiPAP report is below.  He reports he does feel his mask leaking.  He reports that it may be because of his beard. ? ? ? ?06/07/21: ?  ?Todd Rice is a 66 year old male with a history of Parkinson's disease.  He returns today for follow-up.  The patient continues to have a tremor in the right arm.  He states it is worse with stress.  Reports that his balance is a little off.  He is going to therapy.  Has an evaluation every 6 months.  His next evaluation is March 6.  Denies any trouble with his swallowing.  Currently has a new CPAP machine.  Reports that he is sleeping on average about 5 hours most nights.  He has an appointment next month for initial CPAP compliance. ? ? ? ? ?REVIEW OF SYSTEMS: Out of a complete 14 system review of symptoms, the patient complains only of the following symptoms, and all other reviewed systems are negative. ? ? ?I ESS 3 ? ?ALLERGIES: ?Allergies  ?Allergen Reactions  ? Ciprofloxacin Hives  ? Ceftriaxone Hives  ? ? ?HOME MEDICATIONS: ?Outpatient Medications Prior to Visit  ?Medication Sig Dispense Refill  ? albuterol (VENTOLIN HFA) 108 (90 Base) MCG/ACT inhaler INHALE 2 PUFFS EVERY 4 HOURS (15 MINUTES APART) FOR ASTHMA RESCUE 25.5 g 0  ? allopurinol (ZYLOPRIM) 300 MG tablet TAKE 1 TABLET BY MOUTH ONCE A DAY TO TO PREVENT GOUT 90 tablet 3  ? ALPRAZolam (XANAX) 0.5 MG tablet Take 1 tab up to three times a day only if needed for severe anxiety or  panic attack. Avoid daily use. (Patient taking differently: as needed.) 90 tablet 0  ? aspirin EC 81 MG tablet Take 81 mg by mouth at bedtime.    ? carbidopa-levodopa (SINEMET IR) 25-250 MG tablet TAKE 1 TABLET BY MOUTH 3 TIMES DAILY 270 tablet 3  ? ezetimibe (ZETIA) 10 MG tablet Take  1 tablet  Daily  for Cholesterol 90 tablet 3  ? glipiZIDE (GLUCOTROL) 5 MG tablet Take 1 tablet (5 mg total) by mouth 2 (two) times daily before a meal. 60 tablet 2  ? Magnesium 250 MG TABS Take 1 tablet by mouth daily.    ? metFORMIN (GLUCOPHAGE-XR) 500 MG 24 hr tablet TAKE 2 TABLETS BY MOUTH 2 TIMES DAILY 360 tablet 3  ? mometasone (NASONEX) 50 MCG/ACT nasal spray PLACE 2 SPRAYS INTO THE NOSE DAILY. (Patient taking differently: as needed.) 17 g 12  ? polyethylene glycol powder (GLYCOLAX/MIRALAX) 17 GM/SCOOP powder Take 1 Container by mouth daily.    ? pramipexole (MIRAPEX) 1 MG tablet Take 1 tablet (1 mg total) by mouth 3 (three) times daily. 270 tablet 3  ? rosuvastatin (CRESTOR) 40 MG tablet Take 1 tablet (40 mg total) by mouth daily. 90 tablet 1  ? Semaglutide,0.25 or 0.'5MG'$ /DOS, (OZEMPIC, 0.25 OR 0.5 MG/DOSE,) 2 MG/1.5ML SOPN Start by injecting 0.25 mg  into skin of stomach once weekly; if doing well increase to 0.5 mg in 4 weeks. 1.5 mL 0  ? senna (SENOKOT) 8.6 MG tablet Take 1 tablet by mouth daily.    ? valsartan-hydrochlorothiazide (DIOVAN-HCT) 320-25 MG tablet TAKE 1 TABLET BY MOUTH ONCE A DAY FOR BLOOD PRESSURE 90 tablet 1  ? vitamin C (ASCORBIC ACID) 500 MG tablet Take 1 tablet (500 mg total) by mouth 3 (three) times daily. 21 tablet 0  ? VITAMIN D PO Take 5,000 Units by mouth 2 (two) times daily.    ? zolpidem (AMBIEN) 5 MG tablet Take 1 tablet (5 mg total) by mouth at bedtime as needed for sleep. 30 tablet 0  ? ?No facility-administered medications prior to visit.  ? ? ?PAST MEDICAL HISTORY: ?Past Medical History:  ?Diagnosis Date  ? Adult BMI 50.0-59.9 kg/sq m   ? 52.77  ? Anal fissure   ? Anxiety   ? Diabetes mellitus  without complication ([place])   ? borderline  ? Elevated PSA 08/13/2019  ? GERD (gastroesophageal reflux disease)   ? 30 years ago, maybe from peptic ulcer  ? H/O: gout   ? STABLE  ? Hepatic steatosis 10/01/11  ? History of kidney stones   ? Hyperlipidemia   ? Hypertension   ? Kidney tumor   ? right upper kidney tumor, monitoring  ? OSA on CPAP   ? [facility] [facility]  ? Parkinson disease ([place]) 01/04/2016  ? REM sleep behavior disorder 12/06/2016  ? Right ureteral stone   ? Ureteral calculus, right 06/12/2011  ? Vitamin D deficiency   ? Weakness   ? ? ?PAST SURGICAL HISTORY: ?Past Surgical History:  ?Procedure Laterality Date  ? ANAL FISSURE REPAIR  10-12-2000  ? COLONOSCOPY N/A 07/01/2012  ? Procedure: COLONOSCOPY;  Surgeon: Lafayette Dragon, MD;  [facility];  Service: Endoscopy;  Laterality: N/A;  ? CYSTOSCOPY WITH RETROGRADE PYELOGRAM, URETEROSCOPY AND STENT PLACEMENT Bilateral 02/26/2013  ? Procedure: CYSTOSCOPY WITH RETROGRADE PYELOGRAM, URETEROSCOPY AND STENT PLACEMENT;  Surgeon: Alexis Frock, MD;  [facility];  Service: Urology;  Laterality: Bilateral;  ? HOLMIUM LASER APPLICATION Bilateral 27/74/1287  ? Procedure: HOLMIUM LASER APPLICATION;  Surgeon: Alexis Frock, MD;  [facility];  Service: Urology;  Laterality: Bilateral;  ? kidney stone removal    ? 06/2011-stent placement  ? LEFT MEDIAL FEMORAL CONDYLE DEBRIDEMENT & DRILLING/ REMOVAL LOOSE BODY  09-15-2003  ? NASAL SEPTUM SURGERY  1985  ? STONE EXTRACTION WITH BASKET  06/12/2011  ? Procedure: STONE EXTRACTION WITH BASKET;  Surgeon: Claybon Jabs, MD;  Location[facility];  Service: Urology;  Laterality: Right;  ? ? ?FAMILY HISTORY: ?Family History  ?Problem Relation Age of Onset  ? Heart disease Mother   ?     Atrial fibrillation  ? Hypertension Mother   ? Hyperlipidemia Mother   ? Lung cancer Father   ?     was a smoker  ? Cancer Father   ?     lung  ? Hyperlipidemia Brother   ? Melanoma Brother   ? Heart disease Brother   ?      Amyloid  ? Heart disease Maternal Aunt   ? Hyperlipidemia Maternal Aunt   ? Hypertension Maternal Aunt   ? Stroke Maternal Aunt   ? Heart disease Paternal Grandmother   ? Hyperlipidemia Paternal Grandmother   ? Parkinson's disease Neg Hx   ? ? ?SOCIAL HISTORY: ?Social History  ? ?Socioeconomic History  ? Marital  status: Married  ?  Spouse name: Dawn  ? Number of children: 0  ? Years of education: Not on file  ? Highest education level: Not on file  ?Occupational History  ?  Comment: retired  ?Tobacco Use  ? Smoking status: Former  ? Smokeless tobacco: Never  ?Vaping Use  ? Vaping Use: Never used  ?Substance and Sexual Activity  ? Alcohol use: No  ? Drug use: No  ?  Comment: QUIT SMOKING " POT" 40 YRS AGO  ? Sexual activity: Yes  ?  Partners: Female  ?  Birth control/protection: Post-menopausal  ?Other Topics Concern  ? Not on file  ?Social History Narrative  ? Right-handed.  ? No caffeine use.  ? Lives at home with his wife.  ? ?Social Determinants of Health  ? ?Financial Resource Strain: Not on file  ?Food Insecurity: Not on file  ?Transportation Needs: Not on file  ?Physical Activity: Not on file  ?Stress: Not on file  ?Social Connections: Not on file  ?Intimate Partner Violence: Not on file  ? ? ? ? ?PHYSICAL EXAM ? ?Vitals:  ? 08/04/21 1124  ?BP: (!) 145/81  ?Pulse: 79  ?Weight: 264 lb 6.4 oz (119.9 kg)  ?Height: '5\' 7"'$  (1.702 m)  ? ?Body mass index is 41.41 kg/m?. ? ?Generalized: Well developed, in no acute distress  ?Chest: Lungs clear to auscultation bilaterally ? ?Neurological examination  ?Mentation: Alert oriented to time, place, history taking. Follows all commands speech and language fluent ?Cranial nerve II-XII: Extraocular movements were full, visual field were full on confrontational test Head turning and shoulder shrug  were normal and symmetric. ?Motor: The motor testing reveals 5 over 5 strength of all 4 extremities. Good symmetric motor tone is noted throughout. Resting tremor noted in right hand.   ?Sensory: Sensory testing is intact to soft touch on all 4 extremities. No evidence of extinction is noted.  ?Gait and station: Gait is normal.  ? ? ?DIAGNOSTIC DATA (LABS, IMAGING, TESTING) ?- I reviewed

## 2021-08-05 ENCOUNTER — Encounter: Payer: Self-pay | Admitting: Physical Therapy

## 2021-08-05 ENCOUNTER — Ambulatory Visit: Payer: Medicare Other | Admitting: Physical Therapy

## 2021-08-05 ENCOUNTER — Ambulatory Visit: Payer: Medicare Other

## 2021-08-05 DIAGNOSIS — R1310 Dysphagia, unspecified: Secondary | ICD-10-CM

## 2021-08-05 DIAGNOSIS — R471 Dysarthria and anarthria: Secondary | ICD-10-CM | POA: Diagnosis not present

## 2021-08-05 DIAGNOSIS — G20 Parkinson's disease: Secondary | ICD-10-CM | POA: Diagnosis not present

## 2021-08-05 DIAGNOSIS — R2681 Unsteadiness on feet: Secondary | ICD-10-CM

## 2021-08-05 DIAGNOSIS — R293 Abnormal posture: Secondary | ICD-10-CM

## 2021-08-05 DIAGNOSIS — R29818 Other symptoms and signs involving the nervous system: Secondary | ICD-10-CM | POA: Diagnosis not present

## 2021-08-05 DIAGNOSIS — R41841 Cognitive communication deficit: Secondary | ICD-10-CM

## 2021-08-05 DIAGNOSIS — R2689 Other abnormalities of gait and mobility: Secondary | ICD-10-CM | POA: Diagnosis not present

## 2021-08-05 NOTE — Therapy (Signed)
[place] ?[facility] ?[address], [address] ?[place], [place], [address] ?Phone: [phone]   Fax:  [phone] ? ?Speech Language Pathology Treatment ? ?Patient Details  ?Name: Todd Rice ?MRN: [id number] ?Date of Birth: 03-17-1956 ?Referring Provider (SLP): Seymour Bars, NP ? ? ?Encounter Date: 08/05/2021 ? ? End of Session - 08/05/21 1012   ? ? Visit Number 6   ? Number of Visits 17   ? Date for SLP Re-Evaluation 09/19/21   ? SLP Start Time [phone]   ? SLP Stop Time  0930   ? SLP Time Calculation (min) 43 min   ? Activity Tolerance Patient tolerated treatment well   ? ?  ?  ? ?  ? ? ?Past Medical History:  ?Diagnosis Date  ? Adult BMI 50.0-59.9 kg/sq m   ? 52.77  ? Anal fissure   ? Anxiety   ? Diabetes mellitus without complication ([place])   ? borderline  ? Elevated PSA 08/13/2019  ? GERD (gastroesophageal reflux disease)   ? 30 years ago, maybe from peptic ulcer  ? H/O: gout   ? STABLE  ? Hepatic steatosis 10/01/11  ? History of kidney stones   ? Hyperlipidemia   ? Hypertension   ? Kidney tumor   ? right upper kidney tumor, monitoring  ? OSA on CPAP   ? [facility] [facility]  ? Parkinson disease ([place]) 01/04/2016  ? REM sleep behavior disorder 12/06/2016  ? Right ureteral stone   ? Ureteral calculus, right 06/12/2011  ? Vitamin D deficiency   ? Weakness   ? ? ?Past Surgical History:  ?Procedure Laterality Date  ? ANAL FISSURE REPAIR  10-12-2000  ? COLONOSCOPY N/A 07/01/2012  ? Procedure: COLONOSCOPY;  Surgeon: Lafayette Dragon, MD;  [facility];  Service: Endoscopy;  Laterality: N/A;  ? CYSTOSCOPY WITH RETROGRADE PYELOGRAM, URETEROSCOPY AND STENT PLACEMENT Bilateral 02/26/2013  ? Procedure: CYSTOSCOPY WITH RETROGRADE PYELOGRAM, URETEROSCOPY AND STENT PLACEMENT;  Surgeon: Alexis Frock, MD;  [facility];  Service: Urology;  Laterality: Bilateral;  ? HOLMIUM LASER APPLICATION Bilateral 77/41/2878  ? Procedure: HOLMIUM LASER APPLICATION;  Surgeon: Alexis Frock, MD;  [facility];   Service: Urology;  Laterality: Bilateral;  ? kidney stone removal    ? 06/2011-stent placement  ? LEFT MEDIAL FEMORAL CONDYLE DEBRIDEMENT & DRILLING/ REMOVAL LOOSE BODY  09-15-2003  ? NASAL SEPTUM SURGERY  1985  ? STONE EXTRACTION WITH BASKET  06/12/2011  ? Procedure: STONE EXTRACTION WITH BASKET;  Surgeon: Claybon Jabs, MD;  Location: [facility];  Service: Urology;  Laterality: Right;  ? ? ?There were no vitals filed for this visit. ? ? ? ? ? ? ? ? ? ADULT SLP TREATMENT - 08/05/21 1000   ? ?  ? General Information  ? Behavior/Cognition Alert;Cooperative;Pleasant mood   ?  ? Treatment Provided  ? Treatment provided Cognitive-Linquistic   ?  ? Cognitive-Linquistic Treatment  ? Treatment focused on Dysarthria   ? Skilled Treatment Todd Rice rated pt's effectiveness in conversation using CES as 27/32 (higher scores indicate better effectiveness in communication). SLP used loud /a/ to habitualize WNL conversational volume and pt averaged upper 90s dB. Next, SLP had pt produce everyday sentences with average mid-80s dB. SLP moved to pt practice of louder speech in conversation - pt averaged low 70s for approx 8 1/2 minutes and then decr'd to upper 60's - low 70s dB, which improved to WNL loudness for another 4 minutes with min A. Subsequent  conversations were comparable to the first, just described. Suspect pt will need 3-5 more sessions. Wife denied pt shows overt s/sx oral or suspected pharyngeal diffiuclty during meals.   ?  ? Assessment / Recommendations / Plan  ? Plan Continue with current plan of care   ?  ? Progression Toward Goals  ? Progression toward goals Progressing toward goals   ? ?  ?  ? ?  ? ? ? ? ? SLP Short Term Goals - 08/05/21 1038   ? ?  ? SLP SHORT TERM GOAL #1  ? Title pt will maintain loud /a/ average mid 90s dB with appropriate voicing over 4 sessions   ? Baseline 08-02-21, 08-05-21   ? Time 4   ? Period Weeks   ? Status On-going   ? Target Date 08/12/21   ?  ? SLP SHORT TERM GOAL #2  ?  Title pt will produce 20/20 sentences with average 70dB over two sessions   ? Baseline 08-02-21   ? Time 4   ? Period Weeks   ? Status On-going   ? Target Date 08/12/21   ?  ? SLP SHORT TERM GOAL #3  ? Title pt will demo simple conversation of 10 minutes with average 70dB over two sessions   ? Baseline 08-05-21   ? Time 4   ? Period Weeks   ? Status On-going   ? Target Date 08/12/21   ?  ? SLP SHORT TERM GOAL #4  ? Title pt will undergo cognitive linguistic evaluation and/or instrumental/objective swallow assessment PRN   ? Time 4   ? Period Weeks   ? Status On-going   ? Target Date 08/12/21   ? ?  ?  ? ?  ? ? ? SLP Long Term Goals - 08/05/21 1039   ? ?  ? SLP LONG TERM GOAL #1  ? Title pt will maintain loud /a/ at mid 90s dB with proper voicing over 6 sessions   ? Baseline 08/05/21   ? Time 8   ? Period Weeks   ? Status On-going   ? Target Date 09/16/21   ?  ? SLP LONG TERM GOAL #2  ? Title pt will demo 15 minutes mod complex conversastion with average low 70sdB given rare nonverbal cues over three sessions   ? Time 8   ? Period Weeks   ? Status On-going   ? Target Date 09/16/21   ?  ? SLP LONG TERM GOAL #3  ? Title in the last 1-2 visits pt will verify less-frequent "losing my train of thought" compared to prior to [place]   ? Time 8   ? Period Weeks   ? Status On-going   ? Target Date 09/16/21   ?  ? SLP LONG TERM GOAL #4  ? Title pt will use abdominal breathing 80% of the time in 15 minutes mod complex conversation in 3 sessions   ? Time 8   ? Period Weeks   ? Status On-going   ? Target Date 09/16/21   ?  ? SLP LONG TERM GOAL #5  ? Title pt will score higher than his initial score on a communication PROM in last 1-2 sessions   ? Baseline 24/30   ? Time 8   ? Period Weeks   ? Status On-going   ? Target Date 09/16/21   ? ?  ?  ? ?  ? ? ? Plan - 08/05/21 1036   ? ? Clinical  Impression Statement Todd Rice presents today with cont'd mild-mod hypokinetic dysarthria due to parkinson's disease (PD). See "skilled intervention" for  more details of today's session. Pt reports WNL eating/drinking, denying overt s/s aspiration during meals, however pt may require objective swallow eval during this therapy course (MBSS, FEES) and signature on this plan of care will be understood to include agreement with SLP for necessity of any objective/instrumental swallow assessment. SLP believes pt will benefit from skilled ST targeting incr'd volume, possibly some cognitive communication therapy to address attention in conversation, and greater usage of abdomoinal breathing in order to improve communicative effectiveness.   ? Speech Therapy Frequency 2x / week   ? Duration 8 weeks   ? Treatment/Interventions Environmental controls;Compensatory techniques;Functional tasks;Multimodal communcation approach;Cueing hierarchy;Language facilitation;Cognitive reorganization;Internal/external aids;Patient/family education;SLP instruction and feedback;Aspiration precaution training;Pharyngeal strengthening exercises;Oral motor exercises   ? Potential to Achieve Goals Good   ? SLP Home Exercise Plan provided   ? Consulted and Agree with Plan of Care Patient   ? ?  ?  ? ?  ? ? ?Patient will benefit from skilled therapeutic intervention in order to improve the following deficits and impairments:   ?Dysarthria and anarthria ? ?Cognitive communication deficit ? ?Dysphagia, unspecified type ? ? ? ?Problem List ?Patient Active Problem List  ? Diagnosis Date Noted  ? Constipation 03/24/2021  ? Gout 03/23/2021  ? Primary renal papillary carcinoma, right ([place]) 02/18/2020  ? Hepatic steatosis 02/18/2020  ? Major depression in remission ([place]) 11/11/2019  ? Central sleep apnea due to Cheyne-Stokes respiration 08/31/2019  ? Hyperlipidemia associated with type 2 diabetes mellitus ([place]) 07/09/2019  ? Aortic atherosclerosis ([place]) by CXR on 06/10/2018 06/10/2018  ? Type 2 diabetes mellitus with stage 3a chronic kidney disease, without long-term current use of insulin ([place]) 01/21/2018   ? OSA treated with BiPAP 01/03/2017  ? REM sleep behavior disorder 12/06/2016  ? CKD stage 3 due to type 2 diabetes mellitus ([place]) 10/23/2016  ? Medication management 10/23/2016  ? Vitamin D deficiency

## 2021-08-05 NOTE — Therapy (Signed)
[place] ?[facility] ?[address], [address] ?[place], [place], [address] ?Phone: [phone]   Fax:  [phone] ? ?Physical Therapy Treatment ? ?Patient Details  ?Name: Todd Rice ?MRN: [id number] ?Date of Birth: Oct 11, 1955 ?Referring Provider (PT): [place] ? ? ?Encounter Date: 08/05/2021 ? ? PT End of Session - 08/05/21 0805   ? ? Visit Number 6   ? Number of Visits 10   ? Date for PT Re-Evaluation 08/12/21   ? Authorization Type [employer] Medicare   ? PT Start Time [phone]   ? PT Stop Time 5035   ? PT Time Calculation (min) 42 min   ? Activity Tolerance Patient tolerated treatment well   ? Behavior During Therapy [facility] for tasks assessed/performed   ? ?  ?  ? ?  ? ? ?Past Medical History:  ?Diagnosis Date  ? Adult BMI 50.0-59.9 kg/sq m   ? 52.77  ? Anal fissure   ? Anxiety   ? Diabetes mellitus without complication ([place])   ? borderline  ? Elevated PSA 08/13/2019  ? GERD (gastroesophageal reflux disease)   ? 30 years ago, maybe from peptic ulcer  ? H/O: gout   ? STABLE  ? Hepatic steatosis 10/01/11  ? History of kidney stones   ? Hyperlipidemia   ? Hypertension   ? Kidney tumor   ? right upper kidney tumor, monitoring  ? OSA on CPAP   ? [facility] [facility]  ? Parkinson disease ([place]) 01/04/2016  ? REM sleep behavior disorder 12/06/2016  ? Right ureteral stone   ? Ureteral calculus, right 06/12/2011  ? Vitamin D deficiency   ? Weakness   ? ? ?Past Surgical History:  ?Procedure Laterality Date  ? ANAL FISSURE REPAIR  10-12-2000  ? COLONOSCOPY N/A 07/01/2012  ? Procedure: COLONOSCOPY;  Surgeon: Lafayette Dragon, MD;  [facility];  Service: Endoscopy;  Laterality: N/A;  ? CYSTOSCOPY WITH RETROGRADE PYELOGRAM, URETEROSCOPY AND STENT PLACEMENT Bilateral 02/26/2013  ? Procedure: CYSTOSCOPY WITH RETROGRADE PYELOGRAM, URETEROSCOPY AND STENT PLACEMENT;  Surgeon: Alexis Frock, MD;  [facility];  Service: Urology;  Laterality: Bilateral;  ? HOLMIUM LASER APPLICATION Bilateral 46/56/8127  ?  Procedure: HOLMIUM LASER APPLICATION;  Surgeon: Alexis Frock, MD;  [facility];  Service: Urology;  Laterality: Bilateral;  ? kidney stone removal    ? 06/2011-stent placement  ? LEFT MEDIAL FEMORAL CONDYLE DEBRIDEMENT & DRILLING/ REMOVAL LOOSE BODY  09-15-2003  ? NASAL SEPTUM SURGERY  1985  ? STONE EXTRACTION WITH BASKET  06/12/2011  ? Procedure: STONE EXTRACTION WITH BASKET;  Surgeon: Claybon Jabs, MD;  Location: [facility];  Service: Urology;  Laterality: Right;  ? ? ?There were no vitals filed for this visit. ? ? Subjective Assessment - 08/05/21 0805   ? ? Subjective No changes, nothing new.   ? Patient Stated Goals Pt's goal is to get my mind right about my Parkinson's.   ? Currently in Pain? No/denies   ? ?  ?  ? ?  ? ? ? ? ? ? ? ? ? ? ? ? ? ? ? ? ? ? ? ? [place] Adult PT Treatment/Exercise - 08/05/21 0001   ? ?  ? Transfers  ? Transfers Sit to Stand;Stand to Sit   ? Sit to Stand 6: Modified independent (Device/Increase time);Without upper extremity assist;From chair/3-in-1   ? Stand to Sit 6: Modified independent (Device/Increase time);Without upper extremity assist;To chair/3-in-1   ? Number of Reps 10 reps;2  sets   ? Comments 1st set from chair, 2nd set standing on Airex   ?  ? Standardized Balance Assessment  ? Standardized Balance Assessment Timed Up and Go Test   ?  ? Timed Up and Go Test  ? TUG Normal TUG;Cognitive TUG   ? Normal TUG (seconds) 10.81   ? Cognitive TUG (seconds) 11.87   ?  ? Exercises  ? Exercises Knee/Hip   ?  ? Knee/Hip Exercises: Aerobic  ? Nustep NuStep, Level 4, 4 extremities, x 6 minutes, steps/min >100   ?  ? Knee/Hip Exercises: Standing  ? Functional Squat 3 sets;10 reps;Limitations   ? Functional Squat Limitations Squat to PWR! Up x 10, then squat to up on toes x 10, then squat to up on toes on Airex x 10   ? ?  ?  ? ?  ? ? ? ? ? ? Balance Exercises - 08/05/21 0001   ? ?  ? Balance Exercises: Standing  ? Stepping Strategy  Anterior;Posterior;Lateral;Foam/compliant surface;10 reps;Limitations   ? Stepping Strategy Limitations With RLE as stance, cues for full foot placement to lessen supination/inversion   ? Other Standing Exercises On Airex:  wide and narrow BOS EO and EC with minimal to no sway, 30 sec   ? Other Standing Exercises Comments Forward>back step and weightshift, standing on Airex.  Four square step activity, multiple reps;forward/side/back/side stepping, with added change of directions.  Cues for increased foot clearance/step length and brief pause for balance before taking next step.   ? ?  ?  ? ?  ? ? ? ? ? ? ? PT Short Term Goals - 08/02/21 0844   ? ?  ? PT SHORT TERM GOAL #1  ? Title Pt will be independent with HEP for improved balance and gait.  TARGET 07/29/2021   ? Time 2   ? Period Weeks   ? Status Achieved   ? ?  ?  ? ?  ? ? ? ? PT Long Term Goals - 08/05/21 0900   ? ?  ? PT LONG TERM GOAL #1  ? Title Pt will independent with progression of HEP to address Parkinson's deficits for improved balance and gait.  TARGET 08/12/2021   ? Time 5   ? Period Weeks   ? Status On-going   ?  ? PT LONG TERM GOAL #2  ? Title Pt will perform stnading eyes closed on foam with minimal to no sway for improved balance.   ? Baseline 30 sec feet apart/feet together minimal sway   ? Time 5   ? Period Weeks   ? Status Achieved   ?  ? PT LONG TERM GOAL #3  ? Title Pt will improve forward push and release to 1 step or less for improved balance recovery.   ? Time 5   ? Period Weeks   ? Status On-going   ?  ? PT LONG TERM GOAL #4  ? Title Pt will improve TUG/TUG cognitive score to less than or equal to 10% difference for improved dual tasking with gait.   ? Baseline TUG 10.91, cog 14.22   ? Time 5   ? Period Weeks   ? Status On-going   ? ?  ?  ? ?  ? ? ? ? ? ? ? ? ? ?Patient will benefit from skilled therapeutic intervention in order to improve the following deficits and impairments:    ? ?Visit Diagnosis: ?Unsteadiness on feet ? ?Abnormal  posture ? ?  Other abnormalities of gait and mobility ? ? ? ? ?Problem List ?Patient Active Problem List  ? Diagnosis Date Noted  ? Constipation 03/24/2021  ? Gout 03/23/2021  ? Primary renal papillary carcinoma, right ([place]) 02/18/2020  ? Hepatic steatosis 02/18/2020  ? Major depression in remission ([place]) 11/11/2019  ? Central sleep apnea due to Cheyne-Stokes respiration 08/31/2019  ? Hyperlipidemia associated with type 2 diabetes mellitus ([place]) 07/09/2019  ? Aortic atherosclerosis ([place]) by CXR on 06/10/2018 06/10/2018  ? Type 2 diabetes mellitus with stage 3a chronic kidney disease, without long-term current use of insulin ([place]) 01/21/2018  ? OSA treated with BiPAP 01/03/2017  ? REM sleep behavior disorder 12/06/2016  ? CKD stage 3 due to type 2 diabetes mellitus ([place]) 10/23/2016  ? Medication management 10/23/2016  ? Vitamin D deficiency 10/23/2016  ? Parkinson's disease ([place]) 01/04/2016  ? Seasonal and perennial allergic rhinitis 06/21/2014  ? Asthma, mild intermittent, well-controlled 06/21/2014  ? Pseudomonas aeruginosa colonization 10/08/2012  ? CAD (coronary artery disease) 11/15/2011  ? Essential hypertension 11/15/2011  ? Morbid obesity with BMI of 40.0-44.9, adult ([place]) 11/15/2011  ? ? ?Ferron Ishmael W., PT ?08/05/2021, 9:02 AM ? ?[place] ?[facility] ?[address], [address] ?[place], [place], [address] ?Phone: [phone]   Fax:  [phone] ? ?Name: ADIB WAHBA ?MRN: [id number] ?Date of Birth: [birth year] ? ? ? ?

## 2021-08-08 ENCOUNTER — Ambulatory Visit: Payer: Medicare Other

## 2021-08-08 ENCOUNTER — Ambulatory Visit: Payer: Medicare Other | Attending: Adult Health | Admitting: Physical Therapy

## 2021-08-08 DIAGNOSIS — R278 Other lack of coordination: Secondary | ICD-10-CM | POA: Insufficient documentation

## 2021-08-08 DIAGNOSIS — R1310 Dysphagia, unspecified: Secondary | ICD-10-CM | POA: Insufficient documentation

## 2021-08-08 DIAGNOSIS — M6281 Muscle weakness (generalized): Secondary | ICD-10-CM | POA: Diagnosis not present

## 2021-08-08 DIAGNOSIS — R41841 Cognitive communication deficit: Secondary | ICD-10-CM | POA: Insufficient documentation

## 2021-08-08 DIAGNOSIS — R471 Dysarthria and anarthria: Secondary | ICD-10-CM

## 2021-08-08 DIAGNOSIS — R293 Abnormal posture: Secondary | ICD-10-CM

## 2021-08-08 DIAGNOSIS — R29818 Other symptoms and signs involving the nervous system: Secondary | ICD-10-CM | POA: Diagnosis not present

## 2021-08-08 DIAGNOSIS — R29898 Other symptoms and signs involving the musculoskeletal system: Secondary | ICD-10-CM | POA: Diagnosis not present

## 2021-08-08 DIAGNOSIS — R251 Tremor, unspecified: Secondary | ICD-10-CM | POA: Insufficient documentation

## 2021-08-08 DIAGNOSIS — R2689 Other abnormalities of gait and mobility: Secondary | ICD-10-CM | POA: Diagnosis not present

## 2021-08-08 DIAGNOSIS — R2681 Unsteadiness on feet: Secondary | ICD-10-CM

## 2021-08-08 NOTE — Patient Instructions (Signed)
Access Code: [email or web address] ?URL: [email or web address] ?Date: 08/08/2021 ?Prepared by: [facility] ? ?Exercises ?- Half Tandem Stance Balance with Head Rotation  - 1 x daily - 5 x weekly - 1 sets - 10 reps ?- Half Tandem Stance Balance with Head Nods  - 1 x daily - 5 x weekly - 1 sets - 10 reps ?- Alternating Step Forward with Support  - 1 x daily - 5 x weekly - 1-2 sets - 10 reps ?- Alternating Step Backward with Support  - 1 x daily - 5 x weekly - 1-2 sets - 10 reps ?

## 2021-08-08 NOTE — Therapy (Signed)
[facility] ?[address], [address] ?[place], [place], [address] ?Phone: [phone]   Fax:  [phone] ? ?Speech Language Pathology Treatment ? ?Patient Details  ?Name: Todd Rice ?MRN: [id number] ?Date of Birth: 1955-12-26 ?Referring Provider (SLP): Seymour Bars, NP ? ? ?Encounter Date: 08/08/2021 ? ? End of Session - 08/08/21 1300   ? ? Visit Number 7   ? Number of Visits 17   ? Date for SLP Re-Evaluation 09/19/21   ? SLP Start Time 0932   ? SLP Stop Time  1012   ? SLP Time Calculation (min) 40 min   ? Activity Tolerance Patient tolerated treatment well   ? ?  ?  ? ?  ? ? ?Past Medical History:  ?Diagnosis Date  ? Adult BMI 50.0-59.9 kg/sq m   ? 52.77  ? Anal fissure   ? Anxiety   ? Diabetes mellitus without complication ([place])   ? borderline  ? Elevated PSA 08/13/2019  ? GERD (gastroesophageal reflux disease)   ? 30 years ago, maybe from peptic ulcer  ? H/O: gout   ? STABLE  ? Hepatic steatosis 10/01/11  ? History of kidney stones   ? Hyperlipidemia   ? Hypertension   ? Kidney tumor   ? right upper kidney tumor, monitoring  ? OSA on CPAP   ? [employer] [facility]  ? Parkinson disease ([place]) 01/04/2016  ? REM sleep behavior disorder 12/06/2016  ? Right ureteral stone   ? Ureteral calculus, right 06/12/2011  ? Vitamin D deficiency   ? Weakness   ? ? ?Past Surgical History:  ?Procedure Laterality Date  ? ANAL FISSURE REPAIR  10-12-2000  ? COLONOSCOPY N/A 07/01/2012  ? Procedure: COLONOSCOPY;  Surgeon: Lafayette Dragon, MD;  [facility]: [facility];  Service: Endoscopy;  Laterality: N/A;  ? CYSTOSCOPY WITH RETROGRADE PYELOGRAM, URETEROSCOPY AND STENT PLACEMENT Bilateral 02/26/2013  ? Procedure: CYSTOSCOPY WITH RETROGRADE PYELOGRAM, URETEROSCOPY AND STENT PLACEMENT;  Surgeon: Alexis Frock, MD;  Location: [facility];  Service: Urology;  Laterality: Bilateral;  ? HOLMIUM LASER APPLICATION Bilateral 62/83/1517  ? Procedure: HOLMIUM LASER APPLICATION;  Surgeon: Alexis Frock, MD;  Location: [facility];   Service: Urology;  Laterality: Bilateral;  ? kidney stone removal    ? 06/2011-stent placement  ? LEFT MEDIAL FEMORAL CONDYLE DEBRIDEMENT & DRILLING/ REMOVAL LOOSE BODY  09-15-2003  ? NASAL SEPTUM SURGERY  1985  ? STONE EXTRACTION WITH BASKET  06/12/2011  ? Procedure: STONE EXTRACTION WITH BASKET;  Surgeon: Claybon Jabs, MD;  Location: [facility];  Service: Urology;  Laterality: Right;  ? ? ?There were no vitals filed for this visit. ? ? Subjective Assessment - 08/08/21 0934   ? ? Subjective "[place]!"   ? Currently in Pain? No/denies   ? ?  ?  ? ?  ? ? ? ? ? ? ? ? ADULT SLP TREATMENT - 08/08/21 0943   ? ?  ? General Information  ? Behavior/Cognition Alert;Cooperative;Pleasant mood   ?  ? Treatment Provided  ? Treatment provided Cognitive-Linquistic   ?  ? Cognitive-Linquistic Treatment  ? Treatment focused on Dysarthria   ? Skilled Treatment SLP used loud /a/ to habitualize WNL conversational volume and pt averaged upper 90s dB. Next, SLP had pt produce everyday sentences with average mid-80s dB. SLP moved to pt practice of louder speech in conversation - pt averaged low 70s for approx 11 1/2 minutes and then decr'd to upper 60's - low 70s dB,  which improved to WNL loudness for another 6 minutes with min A. Second conversation was low 70s dB for 10 minutes and then with one cue pt incr'd to 4 minutes. Suspect pt will need 2-4 more sessions.   ?  ? Assessment / Recommendations / Plan  ? Plan Continue with current plan of care   ?  ? Progression Toward Goals  ? Progression toward goals Progressing toward goals   ? ?  ?  ? ?  ? ? ? ? ? SLP Short Term Goals - 08/08/21 1300   ? ?  ? SLP SHORT TERM GOAL #1  ? Title pt will maintain loud /a/ average mid 90s dB with appropriate voicing over 4 sessions   ? Baseline 08-02-21, 08-05-21, 08-08-21   ? Time 4   ? Period Weeks   ? Status On-going   ? Target Date 08/12/21   ?  ? SLP SHORT TERM GOAL #2  ? Title pt will produce 20/20 sentences with average 70dB over  two sessions   ? Baseline 08-02-21   ? Status Achieved   ?  ? SLP SHORT TERM GOAL #3  ? Title pt will demo simple conversation of 10 minutes with average 70dB over two sessions   ? Baseline 08-05-21   ? Status Achieved   ?  ? SLP SHORT TERM GOAL #4  ? Title pt will undergo cognitive linguistic evaluation and/or instrumental/objective swallow assessment PRN   ? Status Deferred   ? ?  ?  ? ?  ? ? ? SLP Long Term Goals - 08/08/21 1302   ? ?  ? SLP LONG TERM GOAL #1  ? Title pt will maintain loud /a/ at mid 90s dB with proper voicing over 6 sessions   ? Baseline 08/05/21, 08-08-21   ? Time 8   ? Period Weeks   ? Status On-going   ? Target Date 09/16/21   ?  ? SLP LONG TERM GOAL #2  ? Title pt will demo 15 minutes mod complex conversastion with average low 70sdB given rare nonverbal cues over three sessions   ? Time 8   ? Period Weeks   ? Status On-going   ? Target Date 09/16/21   ?  ? SLP LONG TERM GOAL #3  ? Title in the last 1-2 visits pt will verify less-frequent "losing my train of thought" compared to prior to [place]   ? Time 8   ? Period Weeks   ? Status On-going   ? Target Date 09/16/21   ?  ? SLP LONG TERM GOAL #4  ? Title pt will use abdominal breathing 80% of the time in 15 minutes mod complex conversation in 3 sessions   ? Time 8   ? Period Weeks   ? Status On-going   ? Target Date 09/16/21   ?  ? SLP LONG TERM GOAL #5  ? Title pt will score higher than his initial score on a communication PROM in last 1-2 sessions   ? Baseline 24/30   ? Time 8   ? Period Weeks   ? Status On-going   ? Target Date 09/16/21   ? ?  ?  ? ?  ? ? ? Plan - 08/08/21 1300   ? ? Clinical Impression Statement Todd Rice presents today with cont'd mild hypokinetic dysarthria due to parkinson's disease (PD). See "skilled intervention" for more details of today's session. Pt reports WNL eating/drinking, denying overt s/s aspiration during meals, however pt  may require objective swallow eval during this therapy course (MBSS, FEES) and signature on this  plan of care will be understood to include agreement with SLP for necessity of any objective/instrumental swallow assessment. SLP believes pt will cont to benefit from skilled ST targeting incr'd volume, possibly some cognitive communication therapy to address attention in conversation, and greater usage of abdomoinal breathing in order to improve communicative effectiveness.   ? Speech Therapy Frequency 2x / week   ? Duration 8 weeks   ? Treatment/Interventions Environmental controls;Compensatory techniques;Functional tasks;Multimodal communcation approach;Cueing hierarchy;Language facilitation;Cognitive reorganization;Internal/external aids;Patient/family education;SLP instruction and feedback;Aspiration precaution training;Pharyngeal strengthening exercises;Oral motor exercises   ? Potential to Achieve Goals Good   ? SLP Home Exercise Plan provided   ? Consulted and Agree with Plan of Care Patient   ? ?  ?  ? ?  ? ? ?Patient will benefit from skilled therapeutic intervention in order to improve the following deficits and impairments:   ?Dysarthria and anarthria ? ?Dysphagia, unspecified type ? ?Cognitive communication deficit ? ? ? ?Problem List ?Patient Active Problem List  ? Diagnosis Date Noted  ? Constipation 03/24/2021  ? Gout 03/23/2021  ? Primary renal papillary carcinoma, right ([place]) 02/18/2020  ? Hepatic steatosis 02/18/2020  ? Major depression in remission ([place]) 11/11/2019  ? Central sleep apnea due to Cheyne-Stokes respiration 08/31/2019  ? Hyperlipidemia associated with type 2 diabetes mellitus ([place]) 07/09/2019  ? Aortic atherosclerosis ([place]) by CXR on 06/10/2018 06/10/2018  ? Type 2 diabetes mellitus with stage 3a chronic kidney disease, without long-term current use of insulin ([place]) 01/21/2018  ? OSA treated with BiPAP 01/03/2017  ? REM sleep behavior disorder 12/06/2016  ? CKD stage 3 due to type 2 diabetes mellitus ([place]) 10/23/2016  ? Medication management 10/23/2016  ? Vitamin D deficiency  10/23/2016  ? Parkinson's disease ([place]) 01/04/2016  ? Seasonal and perennial allergic rhinitis 06/21/2014  ? Asthma, mild intermittent, well-controlled 06/21/2014  ? Pseudomonas aeruginosa colonization 06/03/

## 2021-08-08 NOTE — Therapy (Signed)
[facility] ?[address], [address] ?[place], [place], [address] ?Phone: [phone]   Fax:  [phone] ? ?Physical Therapy Treatment ? ?Patient Details  ?Name: Todd Rice ?MRN: [id number] ?Date of Birth: 1956/01/05 ?Referring Provider (PT): [place] ? ? ?Encounter Date: 08/08/2021 ? ? PT End of Session - 08/08/21 0931   ? ? Visit Number 7   ? Number of Visits 10   ? Date for PT Re-Evaluation 08/12/21   ? Authorization Type [employer] Medicare   ? PT Start Time 0848   ? PT Stop Time 0930   ? PT Time Calculation (min) 42 min   ? Activity Tolerance Patient tolerated treatment well   ? Behavior During Therapy [facility] for tasks assessed/performed   ? ?  ?  ? ?  ? ? ?Past Medical History:  ?Diagnosis Date  ? Adult BMI 50.0-59.9 kg/sq m   ? 52.77  ? Anal fissure   ? Anxiety   ? Diabetes mellitus without complication ([place])   ? borderline  ? Elevated PSA 08/13/2019  ? GERD (gastroesophageal reflux disease)   ? 30 years ago, maybe from peptic ulcer  ? H/O: gout   ? STABLE  ? Hepatic steatosis 10/01/11  ? History of kidney stones   ? Hyperlipidemia   ? Hypertension   ? Kidney tumor   ? right upper kidney tumor, monitoring  ? OSA on CPAP   ? [employer] [facility]  ? Parkinson disease ([place]) 01/04/2016  ? REM sleep behavior disorder 12/06/2016  ? Right ureteral stone   ? Ureteral calculus, right 06/12/2011  ? Vitamin D deficiency   ? Weakness   ? ? ?Past Surgical History:  ?Procedure Laterality Date  ? ANAL FISSURE REPAIR  10-12-2000  ? COLONOSCOPY N/A 07/01/2012  ? Procedure: COLONOSCOPY;  Surgeon: Lafayette Dragon, MD;  [facility];  Service: Endoscopy;  Laterality: N/A;  ? CYSTOSCOPY WITH RETROGRADE PYELOGRAM, URETEROSCOPY AND STENT PLACEMENT Bilateral 02/26/2013  ? Procedure: CYSTOSCOPY WITH RETROGRADE PYELOGRAM, URETEROSCOPY AND STENT PLACEMENT;  Surgeon: Alexis Frock, MD;  [facility];  Service: Urology;  Laterality: Bilateral;  ? HOLMIUM LASER APPLICATION Bilateral 25/09/3974  ?  Procedure: HOLMIUM LASER APPLICATION;  Surgeon: Alexis Frock, MD;  [facility];  Service: Urology;  Laterality: Bilateral;  ? kidney stone removal    ? 06/2011-stent placement  ? LEFT MEDIAL FEMORAL CONDYLE DEBRIDEMENT & DRILLING/ REMOVAL LOOSE BODY  09-15-2003  ? NASAL SEPTUM SURGERY  1985  ? STONE EXTRACTION WITH BASKET  06/12/2011  ? Procedure: STONE EXTRACTION WITH BASKET;  Surgeon: Claybon Jabs, MD;  Location: [facility];  Service: Urology;  Laterality: Right;  ? ? ?There were no vitals filed for this visit. ? ? ? ? ? ? ? ? ? ? ? ? ? ? ? ? ? ? ? ? ? [facility] Adult PT Treatment/Exercise - 08/08/21 0001   ? ?  ? Knee/Hip Exercises: Aerobic  ? Nustep NuStep, Level 4, 4 extremities, x 6 minutes, steps/min >100   ? ?  ?  ? ?  ? ? ? ? ? ? Balance Exercises - 08/08/21 0001   ? ?  ? Balance Exercises: Standing  ? Standing Eyes Opened Wide (BOA);Narrow base of support (BOS);Foam/compliant surface;Limitations   ? Standing Eyes Opened Limitations Head turns/head nods x 10 reps   ? Standing Eyes Closed Wide (BOA);Narrow base of support (BOS);Foam/compliant surface;Limitations   ? Standing Eyes Closed Limitations Head turns/nods x 10 EC   ?  Partial Tandem Stance Eyes open;Other reps (comment);Eyes closed   10 reps head turns/nods; EC 10 sec  ? Other Standing Exercises Performed standing PWR! Moves using [employer]ers for added coordination task and added cognitive task.  PWR! Up x 10, PWR! Rock x 10, [place]! Twist x 10, PWR! Step x 10.   ? Other Standing Exercises Comments Marching in place>march in place with naming tasks, then marching in place with alt UE lifts, then with added naming tasks.  Cues for foot clearance.   ? ?  ?  ? ?  ? ?Heel toe raises x 10 reps.  ? ?Forward step and weightshift x 10, alternating sides ?Back step and weightshift x 10, alternating sides; both for reactive balance. ? ? ? PT Education - 08/08/21 0922   ? ? Education Details Additions to HEP-see instructions   ? Person(s)  Educated Patient   ? Methods Explanation;Demonstration;Handout   ? Comprehension Verbalized understanding;Returned demonstration   ? ?  ?  ? ?  ? ? ? PT Short Term Goals - 08/02/21 0844   ? ?  ? PT SHORT TERM GOAL #1  ? Title Pt will be independent with HEP for improved balance and gait.  TARGET 07/29/2021   ? Time 2   ? Period Weeks   ? Status Achieved   ? ?  ?  ? ?  ? ? ? ? PT Long Term Goals - 08/05/21 0900   ? ?  ? PT LONG TERM GOAL #1  ? Title Pt will independent with progression of HEP to address Parkinson's deficits for improved balance and gait.  TARGET 08/12/2021   ? Time 5   ? Period Weeks   ? Status On-going   ?  ? PT LONG TERM GOAL #2  ? Title Pt will perform stnading eyes closed on foam with minimal to no sway for improved balance.   ? Baseline 30 sec feet apart/feet together minimal sway   ? Time 5   ? Period Weeks   ? Status Achieved   ?  ? PT LONG TERM GOAL #3  ? Title Pt will improve forward push and release to 1 step or less for improved balance recovery.   ? Time 5   ? Period Weeks   ? Status On-going   ?  ? PT LONG TERM GOAL #4  ? Title Pt will improve TUG/TUG cognitive score to less than or equal to 10% difference for improved dual tasking with gait.   ? Baseline TUG 10.91, cog 14.22   ? Time 5   ? Period Weeks   ? Status On-going   ? ?  ?  ? ?  ? ? ? ? ? ? ? ? Plan - 08/08/21 0932   ? ? Clinical Impression Statement Skilled PT session today focused on large amplitude motions as well as compliant surface balance and dual task (cognitive additions).  Added to HEP to focus on step strategy and compliant surface balance at home.  Will plan to assess LTGs and per discussion last visit, will plan to likely renew for several additional weeks to focus on high level balance and cognitive dual tasking.   ? Personal Factors and Comorbidities Comorbidity 3+   ? Comorbidities See problem list   ? Examination-Activity Limitations Locomotion Level;Stand   ? Examination-Participation Restrictions Community  Activity   ? Rehab Potential Good   ? PT Frequency Other (comment)   1x/week for 1 week, then 2x/week for 4 weeks  ?  PT Duration Other (comment)   total POC=5 weeks  ? PT Treatment/Interventions ADLs/Self Care Home Management;Gait training;Stair training;Functional mobility training;Therapeutic activities;Therapeutic exercise;Balance training;Neuromuscular re-education;Patient/family education   ? PT Next Visit Plan Review HEP additions.  Check LTGs and plan to renew next visit.  Discuss community fitness.   ? Consulted and Agree with Plan of Care Patient   ? ?  ?  ? ?  ? ? ?Patient will benefit from skilled therapeutic intervention in order to improve the following deficits and impairments:  Abnormal gait, Difficulty walking, Decreased balance, Decreased mobility, Postural dysfunction ? ?Visit Diagnosis: ?Unsteadiness on feet ? ?Abnormal posture ? ?Other abnormalities of gait and mobility ? ? ? ? ?Problem List ?Patient Active Problem List  ? Diagnosis Date Noted  ? Constipation 03/24/2021  ? Gout 03/23/2021  ? Primary renal papillary carcinoma, right ([place]) 02/18/2020  ? Hepatic steatosis 02/18/2020  ? Major depression in remission ([place]) 11/11/2019  ? Central sleep apnea due to Cheyne-Stokes respiration 08/31/2019  ? Hyperlipidemia associated with type 2 diabetes mellitus ([place]) 07/09/2019  ? Aortic atherosclerosis ([place]) by CXR on 06/10/2018 06/10/2018  ? Type 2 diabetes mellitus with stage 3a chronic kidney disease, without long-term current use of insulin ([place]) 01/21/2018  ? OSA treated with BiPAP 01/03/2017  ? REM sleep behavior disorder 12/06/2016  ? CKD stage 3 due to type 2 diabetes mellitus ([place]) 10/23/2016  ? Medication management 10/23/2016  ? Vitamin D deficiency 10/23/2016  ? Parkinson's disease ([place]) 01/04/2016  ? Seasonal and perennial allergic rhinitis 06/21/2014  ? Asthma, mild intermittent, well-controlled 06/21/2014  ? Pseudomonas aeruginosa colonization 10/08/2012  ? CAD (coronary artery disease)  11/15/2011  ? Essential hypertension 11/15/2011  ? Morbid obesity with BMI of 40.0-44.9, adult ([place]) 11/15/2011  ? ? ?Chantalle Defilippo W., PT ?08/08/2021, 9:34 AM ? ?[place] ?[facility] ?[place]

## 2021-08-09 DIAGNOSIS — G4733 Obstructive sleep apnea (adult) (pediatric): Secondary | ICD-10-CM | POA: Diagnosis not present

## 2021-08-10 ENCOUNTER — Ambulatory Visit: Payer: Medicare Other

## 2021-08-10 ENCOUNTER — Encounter: Payer: Self-pay | Admitting: Physical Therapy

## 2021-08-10 ENCOUNTER — Ambulatory Visit: Payer: Medicare Other | Admitting: Physical Therapy

## 2021-08-10 DIAGNOSIS — R293 Abnormal posture: Secondary | ICD-10-CM

## 2021-08-10 DIAGNOSIS — R29818 Other symptoms and signs involving the nervous system: Secondary | ICD-10-CM | POA: Diagnosis not present

## 2021-08-10 DIAGNOSIS — R471 Dysarthria and anarthria: Secondary | ICD-10-CM | POA: Diagnosis not present

## 2021-08-10 DIAGNOSIS — R278 Other lack of coordination: Secondary | ICD-10-CM | POA: Diagnosis not present

## 2021-08-10 DIAGNOSIS — R41841 Cognitive communication deficit: Secondary | ICD-10-CM

## 2021-08-10 DIAGNOSIS — R1310 Dysphagia, unspecified: Secondary | ICD-10-CM

## 2021-08-10 DIAGNOSIS — R29898 Other symptoms and signs involving the musculoskeletal system: Secondary | ICD-10-CM | POA: Diagnosis not present

## 2021-08-10 DIAGNOSIS — R251 Tremor, unspecified: Secondary | ICD-10-CM | POA: Diagnosis not present

## 2021-08-10 DIAGNOSIS — R2689 Other abnormalities of gait and mobility: Secondary | ICD-10-CM | POA: Diagnosis not present

## 2021-08-10 DIAGNOSIS — R2681 Unsteadiness on feet: Secondary | ICD-10-CM

## 2021-08-10 DIAGNOSIS — M6281 Muscle weakness (generalized): Secondary | ICD-10-CM

## 2021-08-10 NOTE — Therapy (Signed)
[facility] ?[address], [address] ?[place], [place], [address] ?Phone: [phone]   Fax:  [phone] ? ?Speech Language Pathology Treatment ? ?Patient Details  ?Name: Todd Rice ?MRN: [id number] ?Date of Birth: 1955-05-30 ?Referring Provider (SLP): Seymour Bars, NP ? ? ?Encounter Date: 08/10/2021 ? ? End of Session - 08/10/21 1215   ? ? Visit Number 8   ? Number of Visits 17   ? Date for SLP Re-Evaluation 09/19/21   ? SLP Start Time 0848   ? SLP Stop Time  0930   ? SLP Time Calculation (min) 42 min   ? Activity Tolerance Patient tolerated treatment well   ? ?  ?  ? ?  ? ? ?Past Medical History:  ?Diagnosis Date  ? Adult BMI 50.0-59.9 kg/sq m   ? 52.77  ? Anal fissure   ? Anxiety   ? Diabetes mellitus without complication ([place])   ? borderline  ? Elevated PSA 08/13/2019  ? GERD (gastroesophageal reflux disease)   ? 30 years ago, maybe from peptic ulcer  ? H/O: gout   ? STABLE  ? Hepatic steatosis 10/01/11  ? History of kidney stones   ? Hyperlipidemia   ? Hypertension   ? Kidney tumor   ? right upper kidney tumor, monitoring  ? OSA on CPAP   ? [employer] [facility]  ? Parkinson disease ([place]) 01/04/2016  ? REM sleep behavior disorder 12/06/2016  ? Right ureteral stone   ? Ureteral calculus, right 06/12/2011  ? Vitamin D deficiency   ? Weakness   ? ? ?Past Surgical History:  ?Procedure Laterality Date  ? ANAL FISSURE REPAIR  10-12-2000  ? COLONOSCOPY N/A 07/01/2012  ? Procedure: COLONOSCOPY;  Surgeon: Lafayette Dragon, MD;  Location: [facility];  Service: Endoscopy;  Laterality: N/A;  ? CYSTOSCOPY WITH RETROGRADE PYELOGRAM, URETEROSCOPY AND STENT PLACEMENT Bilateral 02/26/2013  ? Procedure: CYSTOSCOPY WITH RETROGRADE PYELOGRAM, URETEROSCOPY AND STENT PLACEMENT;  Surgeon: Alexis Frock, MD;  Location: [facility];  Service: Urology;  Laterality: Bilateral;  ? HOLMIUM LASER APPLICATION Bilateral 56/31/4970  ? Procedure: HOLMIUM LASER APPLICATION;  Surgeon: Alexis Frock, MD;  Location: [facility];   Service: Urology;  Laterality: Bilateral;  ? kidney stone removal    ? 06/2011-stent placement  ? LEFT MEDIAL FEMORAL CONDYLE DEBRIDEMENT & DRILLING/ REMOVAL LOOSE BODY  09-15-2003  ? NASAL SEPTUM SURGERY  1985  ? STONE EXTRACTION WITH BASKET  06/12/2011  ? Procedure: STONE EXTRACTION WITH BASKET;  Surgeon: Claybon Jabs, MD;  [facility];  Service: Urology;  Laterality: Right;  ? ? ?There were no vitals filed for this visit. ? ? ? ? ? ? ? ? ? ADULT SLP TREATMENT - 08/10/21 0853   ? ?  ? General Information  ? Behavior/Cognition Alert;Cooperative;Pleasant mood   ?  ? Treatment Provided  ? Treatment provided Cognitive-Linquistic   ?  ? Cognitive-Linquistic Treatment  ? Treatment focused on Dysarthria   ? Skilled Treatment SLP used loud /a/ to habitualize WNL conversational volume and pt averaged upper 90s dB. Next, SLP had pt produce everyday sentences with average mid-80s dB. SLP moved to pt practice of louder speech in conversation - pt averaged low 70s for approx 7 1/2 minutes and then decr'd to upper 60's - low 70s dB, which improved to WNL loudness for another 10 minutes with min A. Second conversation was low 70s dB for 14 minutes and then with one cue pt incr'd to 3  minutes. Suspect pt will need 1-3 more sessions.   ?  ? Assessment / Recommendations / Plan  ? Plan Continue with current plan of care   d/c in 1-3 sessions  ?  ? Progression Toward Goals  ? Progression toward goals Progressing toward goals   ? ?  ?  ? ?  ? ? ? ? ? SLP Short Term Goals - 08/10/21 1216   ? ?  ? SLP SHORT TERM GOAL #1  ? Title pt will maintain loud /a/ average mid 90s dB with appropriate voicing over 4 sessions   ? Baseline 08-02-21, 08-05-21, 08-08-21   ? Status Achieved   ?  ? SLP SHORT TERM GOAL #2  ? Title pt will produce 20/20 sentences with average 70dB over two sessions   ? Baseline 08-02-21   ? Status Achieved   ?  ? SLP SHORT TERM GOAL #3  ? Title pt will demo simple conversation of 10 minutes with average  70dB over two sessions   ? Baseline 08-05-21   ? Status Achieved   ?  ? SLP SHORT TERM GOAL #4  ? Title pt will undergo cognitive linguistic evaluation and/or instrumental/objective swallow assessment PRN   ? Status Deferred   ? ?  ?  ? ?  ? ? ? SLP Long Term Goals - 08/10/21 1216   ? ?  ? SLP LONG TERM GOAL #1  ? Title pt will maintain loud /a/ at mid 90s dB with proper voicing over 6 sessions   ? Baseline 08/05/21, 08-08-21, 08/10/21   ? Time 8   ? Period Weeks   ? Status On-going   ? Target Date 09/16/21   ?  ? SLP LONG TERM GOAL #2  ? Title pt will demo 15 minutes mod complex conversastion with average low 70sdB given rare nonverbal cues over three sessions   ? Time 8   ? Period Weeks   ? Status On-going   ? Target Date 09/16/21   ?  ? SLP LONG TERM GOAL #3  ? Title in the last 1-2 visits pt will verify less-frequent "losing my train of thought" compared to prior to [place]   ? Time 8   ? Period Weeks   ? Status On-going   ? Target Date 09/16/21   ?  ? SLP LONG TERM GOAL #4  ? Title pt will use abdominal breathing 80% of the time in 15 minutes mod complex conversation in 3 sessions   ? Time 8   ? Period Weeks   ? Status On-going   ? Target Date 09/16/21   ?  ? SLP LONG TERM GOAL #5  ? Title pt will score higher than his initial score on a communication PROM in last 1-2 sessions   ? Baseline 24/30   ? Time 8   ? Period Weeks   ? Status On-going   ? Target Date 09/16/21   ? ?  ?  ? ?  ? ? ? Plan - 08/10/21 1215   ? ? Clinical Impression Statement Tymar "[name]" presents today with cont'd mild hypokinetic dysarthria due to parkinson's disease (PD). See "skilled intervention" for more details of today's session. Pt reports WNL eating/drinking, denying overt s/s aspiration during meals, however pt may require objective swallow eval during this therapy course (MBSS, FEES) and signature on this plan of care will be understood to include agreement with SLP for necessity of any objective/instrumental swallow assessment. SLP believes  pt will cont  to benefit from skilled ST targeting incr'd volume, possibly some cognitive communication therapy to address attention in conversation, and greater usage of abdomoinal breathing in order to improve communicative effectiveness.   ? Speech Therapy Frequency 2x / week   ? Duration 8 weeks   ? Treatment/Interventions Environmental controls;Compensatory techniques;Functional tasks;Multimodal communcation approach;Cueing hierarchy;Language facilitation;Cognitive reorganization;Internal/external aids;Patient/family education;SLP instruction and feedback;Aspiration precaution training;Pharyngeal strengthening exercises;Oral motor exercises   ? Potential to Achieve Goals Good   ? SLP Home Exercise Plan provided   ? Consulted and Agree with Plan of Care Patient   ? ?  ?  ? ?  ? ? ?Patient will benefit from skilled therapeutic intervention in order to improve the following deficits and impairments:   ?Dysarthria and anarthria ? ?Dysphagia, unspecified type ? ?Cognitive communication deficit ? ? ? ?Problem List ?Patient Active Problem List  ? Diagnosis Date Noted  ? Constipation 03/24/2021  ? Gout 03/23/2021  ? Primary renal papillary carcinoma, right ([place]) 02/18/2020  ? Hepatic steatosis 02/18/2020  ? Major depression in remission ([place]) 11/11/2019  ? Central sleep apnea due to Cheyne-Stokes respiration 08/31/2019  ? Hyperlipidemia associated with type 2 diabetes mellitus ([place]) 07/09/2019  ? Aortic atherosclerosis ([place]) by CXR on 06/10/2018 06/10/2018  ? Type 2 diabetes mellitus with stage 3a chronic kidney disease, without long-term current use of insulin ([place]) 01/21/2018  ? OSA treated with BiPAP 01/03/2017  ? REM sleep behavior disorder 12/06/2016  ? CKD stage 3 due to type 2 diabetes mellitus ([place]) 10/23/2016  ? Medication management 10/23/2016  ? Vitamin D deficiency 10/23/2016  ? Parkinson's disease ([place]) 01/04/2016  ? Seasonal and perennial allergic rhinitis 06/21/2014  ? Asthma, mild intermittent,  well-controlled 06/21/2014  ? Pseudomonas aeruginosa colonization 10/08/2012  ? CAD (coronary artery disease) 11/15/2011  ? Essential hypertension 11/15/2011  ? Morbid obesity with BMI of 40.0-44.9, adult ([place]

## 2021-08-10 NOTE — Therapy (Addendum)
[facility] [facility] ?[address], [address] ?[place], [place], [address] ?Phone: [phone]   Fax:  [phone] ? ?Physical Therapy Treatment/Recert ? ?Patient Details  ?Name: Todd Rice ?MRN: [id number] ?Date of Birth: [birth year] ?Referring Provider (PT): [place] ? ? ?Encounter Date: 08/10/2021 ? ? PT End of Session - 08/10/21 0806   ? ? Visit Number 8   ? Number of Visits 12   ? Date for PT Re-Evaluation 91/91/66   per recert 0/10/43  ? Authorization Type [employer] Medicare   ? PT Start Time [phone]   ? PT Stop Time 0844   ? PT Time Calculation (min) 41 min   ? Activity Tolerance Patient tolerated treatment well   ? Behavior During Therapy [facility] for tasks assessed/performed   ? ?  ?  ? ?  ? ? ?Past Medical History:  ?Diagnosis Date  ? Adult BMI 50.0-59.9 kg/sq m   ? 52.77  ? Anal fissure   ? Anxiety   ? Diabetes mellitus without complication ([place])   ? borderline  ? Elevated PSA 08/13/2019  ? GERD (gastroesophageal reflux disease)   ? 30 years ago, maybe from peptic ulcer  ? H/O: gout   ? STABLE  ? Hepatic steatosis 10/01/11  ? History of kidney stones   ? Hyperlipidemia   ? Hypertension   ? Kidney tumor   ? right upper kidney tumor, monitoring  ? OSA on CPAP   ? [employer] [facility]  ? Parkinson disease ([place]) 01/04/2016  ? REM sleep behavior disorder 12/06/2016  ? Right ureteral stone   ? Ureteral calculus, right 06/12/2011  ? Vitamin D deficiency   ? Weakness   ? ? ?Past Surgical History:  ?Procedure Laterality Date  ? ANAL FISSURE REPAIR  10-12-2000  ? COLONOSCOPY N/A 07/01/2012  ? Procedure: COLONOSCOPY;  Surgeon: Lafayette Dragon, MD;  [facility];  Service: Endoscopy;  Laterality: N/A;  ? CYSTOSCOPY WITH RETROGRADE PYELOGRAM, URETEROSCOPY AND STENT PLACEMENT Bilateral 02/26/2013  ? Procedure: CYSTOSCOPY WITH RETROGRADE PYELOGRAM, URETEROSCOPY AND STENT PLACEMENT;  Surgeon: Alexis Frock, MD;  Location: [facility];  Service: Urology;  Laterality: Bilateral;  ? HOLMIUM LASER APPLICATION  Bilateral 41/42/3953  ? Procedure: HOLMIUM LASER APPLICATION;  Surgeon: Alexis Frock, MD;  Location: [facility];  Service: Urology;  Laterality: Bilateral;  ? kidney stone removal    ? 06/2011-stent placement  ? LEFT MEDIAL FEMORAL CONDYLE DEBRIDEMENT & DRILLING/ REMOVAL LOOSE BODY  09-15-2003  ? NASAL SEPTUM SURGERY  1985  ? STONE EXTRACTION WITH BASKET  06/12/2011  ? Procedure: STONE EXTRACTION WITH BASKET;  Surgeon: Claybon Jabs, MD;  Location[facility];  Service: Urology;  Laterality: Right;  ? ? ?There were no vitals filed for this visit. ? ? Subjective Assessment - 08/10/21 0805   ? ? Subjective Nothing new.  Stomach bothering me a little bit.  Exercises going well.  Took an extra lap of walking yesterday.   ? Patient Stated Goals Pt's goal is to get my mind right about my Parkinson's.   ? Currently in Pain? No/denies   ? ?  ?  ? ?  ? ? ? ? ? [facility] PT Assessment - 08/10/21 0001   ? ?  ? Timed Up and Go Test  ? Normal TUG (seconds) 11   ? Cognitive TUG (seconds) 13.5   ? TUG Comments --   Improved from eval, still >10% difference  ?  ? Functional Gait  Assessment  ?  Gait assessed  Yes   ? Gait Level Surface Walks 20 ft in less than 5.5 sec, no assistive devices, good speed, no evidence for imbalance, normal gait pattern, deviates no more than 6 in outside of the 12 in walkway width.   ? Change in Gait Speed Able to smoothly change walking speed without loss of balance or gait deviation. Deviate no more than 6 in outside of the 12 in walkway width.   ? Gait with Horizontal Head Turns Performs head turns smoothly with slight change in gait velocity (eg, minor disruption to smooth gait path), deviates 6-10 in outside 12 in walkway width, or uses an assistive device.   6.22  ? Gait with Vertical Head Turns Performs task with slight change in gait velocity (eg, minor disruption to smooth gait path), deviates 6 - 10 in outside 12 in walkway width or uses assistive device   6.12  ? Gait and Pivot Turn  Pivot turns safely within 3 sec and stops quickly with no loss of balance.   ? Step Over Obstacle Is able to step over 2 stacked shoe boxes taped together (9 in total height) without changing gait speed. No evidence of imbalance.   ? Gait with Narrow Base of Support Is able to ambulate for 10 steps heel to toe with no staggering.   ? Gait with Eyes Closed Walks 20 ft, uses assistive device, slower speed, mild gait deviations, deviates 6-10 in outside 12 in walkway width. Ambulates 20 ft in less than 9 sec but greater than 7 sec.   7.97  ? Ambulating Backwards Walks 20 ft, no assistive devices, good speed, no evidence for imbalance, normal gait   10.56  ? Steps Alternating feet, no rail.   ? Total Score 27   ? FGA comment: improved from 25/30   ? ?  ?  ? ?  ? ? ? ? ? ? ? ? ? ?Exercises-Reviewed HEP given last visit, with pt return demo understanding.  Cues to perform partial tandem with head turns/nods x 5 with EC; light UE support at counter. ?- Half Tandem Stance Balance with Head Rotation  - 1 x daily - 5 x weekly - 1 sets - 10 reps ?- Half Tandem Stance Balance with Head Nods  - 1 x daily - 5 x weekly - 1 sets - 10 reps ?- Alternating Step Forward with Support  - 1 x daily - 5 x weekly - 1-2 sets - 10 reps ?- Alternating Step Backward with Support  - 1 x daily - 5 x weekly - 1-2 sets - 10 reps ? ? ? ? ? ? ? [facility] Adult PT Treatment/Exercise - 08/10/21 0001   ? ?  ? Ambulation/Gait  ? Ambulation/Gait Yes   ? Ambulation/Gait Assistance 6: Modified independent (Device/Increase time)   ? Ambulation Distance (Feet) 400 Feet   ? Assistive device Other (Comment)   bilat walking poles  ? Gait Pattern Step-through pattern;Decreased arm swing - right   ? Ambulation Surface Level;Indoor;Unlevel;Outdoor;Paved   ? Gait Comments Extra time to coordinate UEs and reciprocal step length.   ?  ? High Level Balance  ? High Level Balance Comments Push and release test forward and back:  Pt able to recover in 1 step each direction.    ?  ? Exercises  ? Exercises Knee/Hip   ?  ? Knee/Hip Exercises: Aerobic  ? Nustep NuStep, Level 5, 4 extremities, x 8 minutes, steps/min >90, SPM decrease to 75-85 with  conversation   ? ?  ?  ? ?  ? ? ? ? ? ? Balance Exercises - 08/10/21 0001   ? ?  ? Balance Exercises: Standing  ? Standing Eyes Opened Wide (BOA);Foam/compliant surface;Limitations   ? Standing Eyes Opened Limitations Head turns/nods x 10 reps   ? Standing Eyes Closed Wide (BOA);Foam/compliant surface;5 reps;Limitations   ? Standing Eyes Closed Limitations head turns/nods   ? ?  ?  ? ?  ? ? ? ? ? PT Education - 08/10/21 1303   ? ? Education Details Progress towards goals, PT POC   ? Person(s) Educated Patient   ? Methods Explanation   ? Comprehension Verbalized understanding   ? ?  ?  ? ?  ? ? ? PT Short Term Goals - 08/02/21 0844   ? ?  ? PT SHORT TERM GOAL #1  ? Title Pt will be independent with HEP for improved balance and gait.  TARGET 07/29/2021   ? Time 2   ? Period Weeks   ? Status Achieved   ? ?  ?  ? ?  ? ? ? ? PT Long Term Goals - 08/10/21 0807   ? ?  ? PT LONG TERM GOAL #1  ? Title Pt will independent with progression of HEP to address Parkinson's deficits for improved balance and gait.  TARGET 08/12/2021   ? Time 5   ? Period Weeks   ? Status Achieved   ?  ? PT LONG TERM GOAL #2  ? Title Pt will perform stnading eyes closed on foam with minimal to no sway for improved balance.   ? Baseline 30 sec feet apart/feet together minimal sway   ? Time 5   ? Period Weeks   ? Status Achieved   ?  ? PT LONG TERM GOAL #3  ? Title Pt will improve forward push and release to 1 step or less for improved balance recovery.   ? Time 5   ? Period Weeks   ? Status Achieved   ?  ? PT LONG TERM GOAL #4  ? Title Pt will improve TUG/TUG cognitive score to less than or equal to 10% difference for improved dual tasking with gait.   ? Baseline TUG 10.91, cog 14.22;  08/10/2021:  TUG 11 sec; TUG cog 13.5 sec   ? Time 5   ? Period Weeks   ? Status Partially Met   ? ?  ?   ? ?  ? ? ?LTGs for recert: ? ? PT Long Term Goals - 08/10/21 1508   ? ?  ? PT LONG TERM GOAL #1  ? Title Pt will independent with progression of HEP to address Parkinson's deficits for improved balance a

## 2021-08-15 ENCOUNTER — Ambulatory Visit: Payer: Medicare Other

## 2021-08-15 DIAGNOSIS — R2681 Unsteadiness on feet: Secondary | ICD-10-CM | POA: Diagnosis not present

## 2021-08-15 DIAGNOSIS — R251 Tremor, unspecified: Secondary | ICD-10-CM | POA: Diagnosis not present

## 2021-08-15 DIAGNOSIS — R2689 Other abnormalities of gait and mobility: Secondary | ICD-10-CM | POA: Diagnosis not present

## 2021-08-15 DIAGNOSIS — R293 Abnormal posture: Secondary | ICD-10-CM | POA: Diagnosis not present

## 2021-08-15 DIAGNOSIS — R29898 Other symptoms and signs involving the musculoskeletal system: Secondary | ICD-10-CM | POA: Diagnosis not present

## 2021-08-15 DIAGNOSIS — R278 Other lack of coordination: Secondary | ICD-10-CM | POA: Diagnosis not present

## 2021-08-15 DIAGNOSIS — R29818 Other symptoms and signs involving the nervous system: Secondary | ICD-10-CM | POA: Diagnosis not present

## 2021-08-15 DIAGNOSIS — M6281 Muscle weakness (generalized): Secondary | ICD-10-CM | POA: Diagnosis not present

## 2021-08-15 DIAGNOSIS — R471 Dysarthria and anarthria: Secondary | ICD-10-CM

## 2021-08-15 DIAGNOSIS — R1310 Dysphagia, unspecified: Secondary | ICD-10-CM

## 2021-08-15 DIAGNOSIS — R41841 Cognitive communication deficit: Secondary | ICD-10-CM

## 2021-08-15 NOTE — Therapy (Signed)
[facility] ?[address], [address] ?[place], [place], [address] ?Phone: [phone]   Fax:  [phone] ? ?Speech Language Pathology Treatment ? ?Patient Details  ?Name: Todd Rice ?MRN: [id number] ?Date of Birth: [birth year] ?Referring Provider (SLP): Seymour Bars, NP ? ? ?Encounter Date: 08/15/2021 ? ? End of Session - 08/15/21 1233   ? ? Visit Number 9   ? Number of Visits 17   ? Date for SLP Re-Evaluation 09/19/21   ? SLP Start Time [phone]   ? SLP Stop Time  1015   ? SLP Time Calculation (min) 42 min   ? Activity Tolerance Patient tolerated treatment well   ? ?  ?  ? ?  ? ? ?Past Medical History:  ?Diagnosis Date  ? Adult BMI 50.0-59.9 kg/sq m   ? 52.77  ? Anal fissure   ? Anxiety   ? Diabetes mellitus without complication ([place])   ? borderline  ? Elevated PSA 08/13/2019  ? GERD (gastroesophageal reflux disease)   ? 30 years ago, maybe from peptic ulcer  ? H/O: gout   ? STABLE  ? Hepatic steatosis 10/01/11  ? History of kidney stones   ? Hyperlipidemia   ? Hypertension   ? Kidney tumor   ? right upper kidney tumor, monitoring  ? OSA on CPAP   ? [employer] [facility]  ? Parkinson disease ([place]) 01/04/2016  ? REM sleep behavior disorder 12/06/2016  ? Right ureteral stone   ? Ureteral calculus, right 06/12/2011  ? Vitamin D deficiency   ? Weakness   ? ? ?Past Surgical History:  ?Procedure Laterality Date  ? ANAL FISSURE REPAIR  10-12-2000  ? COLONOSCOPY N/A 07/01/2012  ? Procedure: COLONOSCOPY;  Surgeon: Lafayette Dragon, MD;  Location: [facility];  Service: Endoscopy;  Laterality: N/A;  ? CYSTOSCOPY WITH RETROGRADE PYELOGRAM, URETEROSCOPY AND STENT PLACEMENT Bilateral 02/26/2013  ? Procedure: CYSTOSCOPY WITH RETROGRADE PYELOGRAM, URETEROSCOPY AND STENT PLACEMENT;  Surgeon: Alexis Frock, MD;  Location: [facility];  Service: Urology;  Laterality: Bilateral;  ? HOLMIUM LASER APPLICATION Bilateral 11/01/9483  ? Procedure: HOLMIUM LASER APPLICATION;  Surgeon: Alexis Frock, MD;  Location: [facility];   Service: Urology;  Laterality: Bilateral;  ? kidney stone removal    ? 06/2011-stent placement  ? LEFT MEDIAL FEMORAL CONDYLE DEBRIDEMENT & DRILLING/ REMOVAL LOOSE BODY  09-15-2003  ? NASAL SEPTUM SURGERY  1985  ? STONE EXTRACTION WITH BASKET  06/12/2011  ? Procedure: STONE EXTRACTION WITH BASKET;  Surgeon: Claybon Jabs, MD;  Location: [facility];  Service: Urology;  Laterality: Right;  ? ? ?There were no vitals filed for this visit. ? ? ? ? ? ? ? ? ? ADULT SLP TREATMENT - 08/15/21 1230   ? ?  ? General Information  ? Behavior/Cognition Alert;Cooperative;Pleasant mood   ?  ? Treatment Provided  ? Treatment provided Cognitive-Linquistic   ?  ? Cognitive-Linquistic Treatment  ? Treatment focused on Dysarthria   ? Skilled Treatment In order to assess pt's ability to speak for >10 minutes with WNL volume without /a/ initially, SLP did not do /a/ with pt today. [name] was able to maintain WNL volume for 80% of session today in simple-mod complex conversational topics for close to 35 minutes. Abdominal breathing noted 75% of the time today.  Pt will be seen in 9 days to assess progress and decide if last session will be in one week after that, or two weeks after that.   ?  ?  Assessment / Recommendations / Plan  ? Plan --   likely d/c in no more than 2 more visits  ?  ? Progression Toward Goals  ? Progression toward goals Progressing toward goals   ? ?  ?  ? ?  ? ? ? ? ? SLP Short Term Goals - 08/15/21 1234   ? ?  ? SLP SHORT TERM GOAL #1  ? Title pt will maintain loud /a/ average mid 90s dB with appropriate voicing over 4 sessions   ? Baseline 08-02-21, 08-05-21, 08-08-21   ? Status Achieved   ?  ? SLP SHORT TERM GOAL #2  ? Title pt will produce 20/20 sentences with average 70dB over two sessions   ? Baseline 08-02-21   ? Status Achieved   ?  ? SLP SHORT TERM GOAL #3  ? Title pt will demo simple conversation of 10 minutes with average 70dB over two sessions   ? Baseline 08-05-21   ? Status Achieved   ?  ? SLP SHORT  TERM GOAL #4  ? Title pt will undergo cognitive linguistic evaluation and/or instrumental/objective swallow assessment PRN   ? Status Deferred   ? ?  ?  ? ?  ? ? ? SLP Long Term Goals - 08/15/21 1234   ? ?  ? SLP LONG TERM GOAL #1  ? Title pt will maintain loud /a/ at mid 90s dB with proper voicing over 6 sessions   ? Baseline 08/05/21, 08-08-21, 08/10/21   ? Time 8   ? Period Weeks   ? Status On-going   ? Target Date 09/16/21   ?  ? SLP LONG TERM GOAL #2  ? Title pt will demo 15 minutes mod complex conversastion with average low 70sdB given rare nonverbal cues over three sessions   ? Baseline 08-15-21   ? Time 8   ? Period Weeks   ? Status On-going   ? Target Date 09/16/21   ?  ? SLP LONG TERM GOAL #3  ? Title in the last 1-2 visits pt will verify less-frequent "losing my train of thought" compared to prior to [name]   ? Time 8   ? Period Weeks   ? Status On-going   ? Target Date 09/16/21   ?  ? SLP LONG TERM GOAL #4  ? Title pt will use abdominal breathing 80% of the time in 15 minutes mod complex conversation in 3 sessions   ? Time 8   ? Period Weeks   ? Status On-going   ? Target Date 09/16/21   ?  ? SLP LONG TERM GOAL #5  ? Title pt will score higher than his initial score on a communication PROM in last 1-2 sessions   ? Baseline 24/30   ? Time 8   ? Period Weeks   ? Status On-going   ? Target Date 09/16/21   ? ?  ?  ? ?  ? ? ? Plan - 08/15/21 1234   ? ? Clinical Impression Statement Shed "[name]" presents today with cont'd mild hypokinetic dysarthria due to parkinson's disease (PD). See "skilled intervention" for more details of today's session. Pt reports WNL eating/drinking, denying overt s/s aspiration during meals, however pt may require objective swallow eval during this therapy course (MBSS, FEES) and signature on this plan of care will be understood to include agreement with SLP for necessity of any objective/instrumental swallow assessment. SLP believes pt will cont to benefit from skilled ST targeting incr'd  volume,  possibly some cognitive communication therapy to address attention in conversation, and greater usage of abdomoinal breathing in order to improve communicative effectiveness. Likely d/c in no more than two more visits.   ? Speech Therapy Frequency 2x / week   ? Duration 8 weeks   ? Treatment/Interventions Environmental controls;Compensatory techniques;Functional tasks;Multimodal communcation approach;Cueing hierarchy;Language facilitation;Cognitive reorganization;Internal/external aids;Patient/family education;SLP instruction and feedback;Aspiration precaution training;Pharyngeal strengthening exercises;Oral motor exercises   ? Potential to Achieve Goals Good   ? SLP Home Exercise Plan provided   ? Consulted and Agree with Plan of Care Patient   ? ?  ?  ? ?  ? ? ?Patient will benefit from skilled therapeutic intervention in order to improve the following deficits and impairments:   ?Dysarthria and anarthria ? ?Dysphagia, unspecified type ? ?Cognitive communication deficit ? ? ? ?Problem List ?Patient Active Problem List  ? Diagnosis Date Noted  ? Constipation 03/24/2021  ? Gout 03/23/2021  ? Primary renal papillary carcinoma, right ([place]) 02/18/2020  ? Hepatic steatosis 02/18/2020  ? Major depression in remission ([place]) 11/11/2019  ? Central sleep apnea due to Cheyne-Stokes respiration 08/31/2019  ? Hyperlipidemia associated with type 2 diabetes mellitus ([place]) 07/09/2019  ? Aortic atherosclerosis ([place]) by CXR on 06/10/2018 06/10/2018  ? Type 2 diabetes mellitus with stage 3a chronic kidney disease, without long-term current use of insulin ([place]) 01/21/2018  ? OSA treated with BiPAP 01/03/2017  ? REM sleep behavior disorder 12/06/2016  ? CKD stage 3 due to type 2 diabetes mellitus ([place]) 10/23/2016  ? Medication management 10/23/2016  ? Vitamin D deficiency 10/23/2016  ? Parkinson's disease ([place]) 01/04/2016  ? Seasonal and perennial allergic rhinitis 06/21/2014  ? Asthma, mild intermittent, well-controlled  06/21/2014  ? Pseudomonas aeruginosa colonization 10/08/2012  ? CAD (coronary artery disease) 11/15/2011  ? Essential hypertension 11/15/2011  ? Morbid obesity with BMI of 40.0-44.9, adult ([place]) 11/15/2011

## 2021-08-18 ENCOUNTER — Ambulatory Visit: Payer: Medicare Other | Admitting: Physical Therapy

## 2021-08-18 DIAGNOSIS — R2689 Other abnormalities of gait and mobility: Secondary | ICD-10-CM

## 2021-08-18 DIAGNOSIS — R293 Abnormal posture: Secondary | ICD-10-CM

## 2021-08-18 DIAGNOSIS — R2681 Unsteadiness on feet: Secondary | ICD-10-CM

## 2021-08-18 NOTE — Therapy (Signed)
[facility] ?[address], [address] ?[place], [place], [address] ?Phone: [phone]   Fax:  [phone] ? ?Physical Therapy Treatment ? ?Patient Details  ?Name: Todd Rice ?MRN: [id number] ?Date of Birth: 1955-12-28 ?Referring Provider (PT): [place] ? ? ?Encounter Date: 08/18/2021 ? ? PT End of Session - 08/18/21 1228   ? ? Visit Number 9   ? Number of Visits 12   ? Date for PT Re-Evaluation 78/24/23   per recert 09/08/6142  ? Authorization Type [employer] Medicare   ? PT Start Time 1150   ? PT Stop Time 3154   ? PT Time Calculation (min) 39 min   ? Activity Tolerance Patient tolerated treatment well   ? Behavior During Therapy [facility] for tasks assessed/performed   ? ?  ?  ? ?  ? ? ?Past Medical History:  ?Diagnosis Date  ? Adult BMI 50.0-59.9 kg/sq m   ? 52.77  ? Anal fissure   ? Anxiety   ? Diabetes mellitus without complication ([place])   ? borderline  ? Elevated PSA 08/13/2019  ? GERD (gastroesophageal reflux disease)   ? 30 years ago, maybe from peptic ulcer  ? H/O: gout   ? STABLE  ? Hepatic steatosis 10/01/11  ? History of kidney stones   ? Hyperlipidemia   ? Hypertension   ? Kidney tumor   ? right upper kidney tumor, monitoring  ? OSA on CPAP   ? [employer] [facility]  ? Parkinson disease ([place]) 01/04/2016  ? REM sleep behavior disorder 12/06/2016  ? Right ureteral stone   ? Ureteral calculus, right 06/12/2011  ? Vitamin D deficiency   ? Weakness   ? ? ?Past Surgical History:  ?Procedure Laterality Date  ? ANAL FISSURE REPAIR  10-12-2000  ? COLONOSCOPY N/A 07/01/2012  ? Procedure: COLONOSCOPY;  Surgeon: Lafayette Dragon, MD;  [facility];  Service: Endoscopy;  Laterality: N/A;  ? CYSTOSCOPY WITH RETROGRADE PYELOGRAM, URETEROSCOPY AND STENT PLACEMENT Bilateral 02/26/2013  ? Procedure: CYSTOSCOPY WITH RETROGRADE PYELOGRAM, URETEROSCOPY AND STENT PLACEMENT;  Surgeon: Alexis Frock, MD;  [facility];  Service: Urology;  Laterality: Bilateral;  ? HOLMIUM LASER APPLICATION  Bilateral 00/86/7619  ? Procedure: HOLMIUM LASER APPLICATION;  Surgeon: Alexis Frock, MD;  [facility];  Service: Urology;  Laterality: Bilateral;  ? kidney stone removal    ? 06/2011-stent placement  ? LEFT MEDIAL FEMORAL CONDYLE DEBRIDEMENT & DRILLING/ REMOVAL LOOSE BODY  09-15-2003  ? NASAL SEPTUM SURGERY  1985  ? STONE EXTRACTION WITH BASKET  06/12/2011  ? Procedure: STONE EXTRACTION WITH BASKET;  Surgeon: Claybon Jabs, MD;  Location: [facility];  Service: Urology;  Laterality: Right;  ? ? ?There were no vitals filed for this visit. ? ? Subjective Assessment - 08/18/21 1151   ? ? Subjective Didn't sleep well last night.   ? Patient Stated Goals Pt's goal is to get my mind right about my Parkinson's.   ? Currently in Pain? No/denies   ? ?  ?  ? ?  ? ? ? ? ? ? ? ? ? ? ? ? ? ? ? ? ? ? ? ? [place] Adult PT Treatment/Exercise - 08/18/21 0001   ? ?  ? Ambulation/Gait  ? Ambulation/Gait Yes   ? Ambulation/Gait Assistance 6: Modified independent (Device/Increase time)   ? Ambulation Distance (Feet) 1000 Feet   plus, outdoor surfaces with conversation tasks  ? Assistive device None   ? Gait Pattern Step-through  pattern;Decreased arm swing - right   ? Ambulation Surface Level;Indoor;Unlevel;Outdoor   ? Gait Comments Gait with conversation tasks outdoors   ?  ? Knee/Hip Exercises: Aerobic  ? Nustep NuStep, Level 5, 4 extremities, x 6 minutes, steps/min >90, SPM decrease to 75-85 with conversation   ? ?  ?  ? ?  ? ? ? ? [facility]) - 08/18/21 1200   ? ? PWR! exercises Moves in sitting;Moves in quadraped   ? PWR! Up x 5   ? PWR! Rock x 5   ? PWR! Twist x 5 reps each side   ? PWR! Step x 5 each side   ? Comments Modified quadruped at floor/ UES on mat table   ? PWR! Up x 5   ? PWR! Rock x 5 reps each side   ? PWR! Twist x 5 reps each side   ? PWR! Step x 5 reps each side, single step out and in   ? Comments PWR! Moves in sitting   ? ?  ?  ? ?  ? ? ? ? ? ? ? PT Education - 08/18/21 1157   ? ? Education  Details PWR! Moves exercise class ([place] information).  PWR! Moves sitting and quadruped   ? Person(s) Educated Patient   ? Methods Explanation;Handout;Demonstration   ? Comprehension Verbalized understanding;Returned demonstration   ? ?  ?  ? ?  ? ? ? PT Short Term Goals - 08/02/21 0844   ? ?  ? PT SHORT TERM GOAL #1  ? Title Pt will be independent with HEP for improved balance and gait.  TARGET 07/29/2021   ? Time 2   ? Period Weeks   ? Status Achieved   ? ?  ?  ? ?  ? ? ? ? PT Long Term Goals - 08/10/21 1508   ? ?  ? PT LONG TERM GOAL #1  ? Title Pt will independent with progression of HEP to address Parkinson's deficits for improved balance and gait.  TARGET4/21/2023   ? Time 2   ? Period Weeks   ? Status Revised   ?  ? PT LONG TERM GOAL #2  ? Title Pt will report walking program with walking poles at least 3x/wk for improved addition to his HEP.   ? Time 2   ? Period Weeks   ? Status New   ?  ? PT LONG TERM GOAL #3  ? Title Pt will verbalize plans for continued community fitness upon d/c from PT.   ? Time 2   ? Period Weeks   ? Status New   ?  ? PT LONG TERM GOAL #4  ? Title Pt will improve TUG/TUG cognitive score to less than or equal to 10% difference for improved dual tasking with gait.   ? Baseline TUG 10.91, cog 14.22;  08/10/2021:  TUG 11 sec; TUG cog 13.5 sec   ? Time 5   ? Period Weeks   ? Status On-going   ? ?  ?  ? ?  ? ? ? ? ? ? ? ? Plan - 08/18/21 1234   ? ? Clinical Impression Statement Focused skilled PT session on various PWR! Moves positions, as pt reports he would like to attend the PWR! Moves class at [facility].  Performed seated and quadruped today and added to HEP.  Gait training with conversation tasks, with cues for increased RLE step length and R arm swing.  Pt willc ontinue to  beneift from skilled PT towards LTGs, with anticipated d/c next week.   ? Personal Factors and Comorbidities Comorbidity 3+   ? Comorbidities See problem list   ? Examination-Activity Limitations Locomotion  Level;Stand   ? Examination-Participation Restrictions Community Activity   ? Rehab Potential Good   ? PT Frequency 2x / week   ? PT Duration 2 weeks   per recert 09/08/84  ? PT Treatment/Interventions ADLs/Self Care Home Management;Gait training;Stair training;Functional mobility training;Therapeutic activities;Therapeutic exercise;Balance training;Neuromuscular re-education;Patient/family education   ? PT Next Visit Plan Review HEP additions.  Gait and conversation tasks; 10th visit progress note; d/c next week   ? Consulted and Agree with Plan of Care Patient   ? ?  ?  ? ?  ? ? ?Patient will benefit from skilled therapeutic intervention in order to improve the following deficits and impairments:  Abnormal gait, Difficulty walking, Decreased balance, Decreased mobility, Postural dysfunction ? ?Visit Diagnosis: ?Unsteadiness on feet ? ?Other abnormalities of gait and mobility ? ?Abnormal posture ? ? ? ? ?Problem List ?Patient Active Problem List  ? Diagnosis Date Noted  ? Constipation 03/24/2021  ? Gout 03/23/2021  ? Primary renal papillary carcinoma, right ([place]) 02/18/2020  ? Hepatic steatosis 02/18/2020  ? Major depression in remission ([place]) 11/11/2019  ? Central sleep apnea due to Cheyne-Stokes respiration 08/31/2019  ? Hyperlipidemia associated with type 2 diabetes mellitus ([place]) 07/09/2019  ? Aortic atherosclerosis ([place]) by CXR on 06/10/2018 06/10/2018  ? Type 2 diabetes mellitus with stage 3a chronic kidney disease, without long-term current use of insulin ([place]) 01/21/2018  ? OSA treated with BiPAP 01/03/2017  ? REM sleep behavior disorder 12/06/2016  ? CKD stage 3 due to type 2 diabetes mellitus ([place]) 10/23/2016  ? Medication management 10/23/2016  ? Vitamin D deficiency 10/23/2016  ? Parkinson's disease ([place]) 01/04/2016  ? Seasonal and perennial allergic rhinitis 06/21/2014  ? Asthma, mild intermittent, well-controlled 06/21/2014  ? Pseudomonas aeruginosa colonization 10/08/2012  ? CAD (coronary artery  disease) 11/15/2011  ? Essential hypertension 11/15/2011  ? Morbid obesity with BMI of 40.0-44.9, adult ([place]) 11/15/2011  ? ? ?Hassaan Crite W., PT ?08/18/2021, 3:05 PM ? ?[place] ?[facility] ?[place]

## 2021-08-22 ENCOUNTER — Ambulatory Visit: Payer: Medicare Other | Admitting: Physical Therapy

## 2021-08-22 ENCOUNTER — Encounter: Payer: Self-pay | Admitting: Physical Therapy

## 2021-08-22 ENCOUNTER — Other Ambulatory Visit (HOSPITAL_BASED_OUTPATIENT_CLINIC_OR_DEPARTMENT_OTHER): Payer: Self-pay

## 2021-08-22 DIAGNOSIS — R2689 Other abnormalities of gait and mobility: Secondary | ICD-10-CM

## 2021-08-22 DIAGNOSIS — R293 Abnormal posture: Secondary | ICD-10-CM | POA: Diagnosis not present

## 2021-08-22 DIAGNOSIS — R278 Other lack of coordination: Secondary | ICD-10-CM | POA: Diagnosis not present

## 2021-08-22 DIAGNOSIS — R29898 Other symptoms and signs involving the musculoskeletal system: Secondary | ICD-10-CM | POA: Diagnosis not present

## 2021-08-22 DIAGNOSIS — R2681 Unsteadiness on feet: Secondary | ICD-10-CM

## 2021-08-22 DIAGNOSIS — M6281 Muscle weakness (generalized): Secondary | ICD-10-CM | POA: Diagnosis not present

## 2021-08-22 DIAGNOSIS — R251 Tremor, unspecified: Secondary | ICD-10-CM | POA: Diagnosis not present

## 2021-08-22 DIAGNOSIS — R471 Dysarthria and anarthria: Secondary | ICD-10-CM | POA: Diagnosis not present

## 2021-08-22 DIAGNOSIS — R29818 Other symptoms and signs involving the nervous system: Secondary | ICD-10-CM | POA: Diagnosis not present

## 2021-08-22 DIAGNOSIS — R1310 Dysphagia, unspecified: Secondary | ICD-10-CM | POA: Diagnosis not present

## 2021-08-22 NOTE — Therapy (Signed)
[facility] ?[address], [address] ?[place], [place], [address] ?Phone: [phone]   Fax:  [phone] ? ?Physical Therapy Treatment/10th Visit Progress Note ? ?Patient Details  ?Name: Todd Rice ?MRN: [id number] ?Date of Birth: 1955/09/25 ?Referring Provider (PT): [place] ? ? ?Encounter Date: 08/22/2021 ? ? PT End of Session - 08/22/21 0939   ? ? Visit Number 10   ? Number of Visits 12   ? Date for PT Re-Evaluation 81/82/99   per recert 07/13/1694  ? Authorization Type U[employer] Medicare   ? PT Start Time [phone]   ? PT Stop Time 1014   ? PT Time Calculation (min) 38 min   ? Activity Tolerance Patient tolerated treatment well   ? Behavior During Therapy [facility] for tasks assessed/performed   ? ?  ?  ? ?  ? ? ?Past Medical History:  ?Diagnosis Date  ? Adult BMI 50.0-59.9 kg/sq m   ? 52.77  ? Anal fissure   ? Anxiety   ? Diabetes mellitus without complication ([place])   ? borderline  ? Elevated PSA 08/13/2019  ? GERD (gastroesophageal reflux disease)   ? 30 years ago, maybe from peptic ulcer  ? H/O: gout   ? STABLE  ? Hepatic steatosis 10/01/11  ? History of kidney stones   ? Hyperlipidemia   ? Hypertension   ? Kidney tumor   ? right upper kidney tumor, monitoring  ? OSA on CPAP   ? [employer] [facility]  ? Parkinson disease ([place]) 01/04/2016  ? REM sleep behavior disorder 12/06/2016  ? Right ureteral stone   ? Ureteral calculus, right 06/12/2011  ? Vitamin D deficiency   ? Weakness   ? ? ?Past Surgical History:  ?Procedure Laterality Date  ? ANAL FISSURE REPAIR  10-12-2000  ? COLONOSCOPY N/A 07/01/2012  ? Procedure: COLONOSCOPY;  Surgeon: Lafayette Dragon, MD;  [facility]: [facility];  Service: Endoscopy;  Laterality: N/A;  ? CYSTOSCOPY WITH RETROGRADE PYELOGRAM, URETEROSCOPY AND STENT PLACEMENT Bilateral 02/26/2013  ? Procedure: CYSTOSCOPY WITH RETROGRADE PYELOGRAM, URETEROSCOPY AND STENT PLACEMENT;  Surgeon: Alexis Frock, MD;  Location: [facility];  Service: Urology;  Laterality: Bilateral;  ?  HOLMIUM LASER APPLICATION Bilateral 81/05/7508  ? Procedure: HOLMIUM LASER APPLICATION;  Surgeon: Alexis Frock, MD;  Location: [facility];  Service: Urology;  Laterality: Bilateral;  ? kidney stone removal    ? 06/2011-stent placement  ? LEFT MEDIAL FEMORAL CONDYLE DEBRIDEMENT & DRILLING/ REMOVAL LOOSE BODY  09-15-2003  ? NASAL SEPTUM SURGERY  1985  ? STONE EXTRACTION WITH BASKET  06/12/2011  ? Procedure: STONE EXTRACTION WITH BASKET;  Surgeon: Claybon Jabs, MD;  Location: [facility];  Service: Urology;  Laterality: Right;  ? ? ?There were no vitals filed for this visit. ? ? Subjective Assessment - 08/22/21 0937   ? ? Subjective Still tend to lean forward sometimes, like in the shower.   ? Patient Stated Goals Pt's goal is to get my mind right about my Parkinson's.   ? Currently in Pain? No/denies   ? ?  ?  ? ?  ? ? ? ? ? ? ? ? ? ? ? ? ? ? ? ? ? ? ? ? [place] Adult PT Treatment/Exercise - 08/22/21 0001   ? ?  ? Ambulation/Gait  ? Ambulation/Gait Yes   ? Ambulation/Gait Assistance 6: Modified independent (Device/Increase time)   ? Ambulation Distance (Feet) 125 Feet   x 4 reps  ? Assistive device None   ?  Gait Pattern Step-through pattern;Decreased arm swing - right   ? Ambulation Surface Level;Indoor   ? Gait Comments Short distance gait in clinic with cues for equal/opposite arm swing for improved RUE arm swing.   ? ?  ?  ? ?  ? ? ? ? P[facility]) - 08/22/21 1610   ? ? PWR! exercises Moves in standing   ? PWR! Up x 10   ? PWR! Rock x 10   ? PWR! Twist x 10   ? Comments standing against wall for improved postural awareness   ? ?  ?  ? ?  ? ?Postural Exercises with cues for extensor activation and upright posture: ? ?Seated PWR! Up x 10 reps ?Sit<>Stand with PWR! Up in standing x 10 reps ?Standing PWR! Up x 10 reps ? ?Against wall, scapular squeezes x 10 reps for postural awareness in standing ? ? ? ? Balance Exercises - 08/22/21 0001   ? ?  ? Balance Exercises: Standing  ? Standing Eyes Opened Wide  (BOA);Foam/compliant surface;Limitations;Narrow base of support (BOS)   ? Standing Eyes Opened Limitations Head turns/nods x 10 reps   ? Standing Eyes Closed Wide (BOA);Solid surface;Narrow base of support (BOS);3 reps;10 secs   ? Standing Eyes Closed Limitations standing against wall for postural awareness/feeback   ? Other Standing Exercises High level balance/coordination to work on reciprocal arm swing as well as shoulder flexion/extension for improved arm swing.  Forward step/stop with added reciprocal arm swing, then forward march/stop with added reciprocal arm lifts-cues throughout to slow pace/hold position briefly.   ? ?  ?  ? ?  ? ? ? ? ? PT Education - 08/22/21 1153   ? ? Education Details Postural awareness   ? Person(s) Educated Patient   ? Methods Explanation;Demonstration;Verbal cues   ? Comprehension Verbalized understanding;Returned demonstration   ? ?  ?  ? ?  ? ? ? PT Short Term Goals - 08/02/21 0844   ? ?  ? PT SHORT TERM GOAL #1  ? Title Pt will be independent with HEP for improved balance and gait.  TARGET 07/29/2021   ? Time 2   ? Period Weeks   ? Status Achieved   ? ?  ?  ? ?  ? ? ? ? PT Long Term Goals - 08/10/21 1508   ? ?  ? PT LONG TERM GOAL #1  ? Title Pt will independent with progression of HEP to address Parkinson's deficits for improved balance and gait.  TARGET4/21/2023   ? Time 2   ? Period Weeks   ? Status Revised   ?  ? PT LONG TERM GOAL #2  ? Title Pt will report walking program with walking poles at least 3x/wk for improved addition to his HEP.   ? Time 2   ? Period Weeks   ? Status New   ?  ? PT LONG TERM GOAL #3  ? Title Pt will verbalize plans for continued community fitness upon d/c from PT.   ? Time 2   ? Period Weeks   ? Status New   ?  ? PT LONG TERM GOAL #4  ? Title Pt will improve TUG/TUG cognitive score to less than or equal to 10% difference for improved dual tasking with gait.   ? Baseline TUG 10.91, cog 14.22;  08/10/2021:  TUG 11 sec; TUG cog 13.5 sec   ? Time 5   ?  Period Weeks   ? Status On-going   ? ?  ?  ? ?  ? ? ? ? ? ? ? ?  Plan - 08/22/21 0955   ? ? Clinical Impression Statement 10th Visit Progress Note, covering dates 07/11/2021-08/22/2021.  Subjectively, pt reports improved awareness of posture and R arm swing at end of visit today;  feel like all of PT is helpful.  At beginning of session, he reports sometimes more forward flexed posture.  Objective measures (from 08/10/2021):  TUG 11, TUG cog 13.5, FGA 27/30.  Pt with decreased timing/coordination of gait, with decreased R arm swing; improved with cues for increased intensity. Skilled PT session today focused on postural awareness, increased intensity of movement patterns to involve RUE for improved reciprocal arm swing.  He has been educated in various large amplitude movmeent patterns, and he is planning to join community PWR! Moves class for ongoing reinforcement of movement patterns.   Pt is on track towards LTGs, with plan for discharge next visit.   ? Personal Factors and Comorbidities Comorbidity 3+   ? Comorbidities See problem list   ? Examination-Activity Limitations Locomotion Level;Stand   ? Examination-Participation Restrictions Community Activity   ? Rehab Potential Good   ? PT Frequency 2x / week   ? PT Duration 2 weeks   per recert 08/12/960  ? PT Treatment/Interventions ADLs/Self Care Home Management;Gait training;Stair training;Functional mobility training;Therapeutic activities;Therapeutic exercise;Balance training;Neuromuscular re-education;Patient/family education   ? PT Next Visit Plan D/C next visit   ? Consulted and Agree with Plan of Care Patient   ? ?  ?  ? ?  ? ? ?Patient will benefit from skilled therapeutic intervention in order to improve the following deficits and impairments:  Abnormal gait, Difficulty walking, Decreased balance, Decreased mobility, Postural dysfunction ? ?Visit Diagnosis: ?Unsteadiness on feet ? ?Other abnormalities of gait and mobility ? ?Abnormal posture ? ? ? ? ?Problem  List ?Patient Active Problem List  ? Diagnosis Date Noted  ? Constipation 03/24/2021  ? Gout 03/23/2021  ? Primary renal papillary carcinoma, right ([place]) 02/18/2020  ? Hepatic steatosis 02/18/2020  ? Major depression

## 2021-08-24 ENCOUNTER — Encounter: Payer: Self-pay | Admitting: Physical Therapy

## 2021-08-24 ENCOUNTER — Ambulatory Visit: Payer: Medicare Other | Admitting: Physical Therapy

## 2021-08-24 ENCOUNTER — Ambulatory Visit: Payer: Medicare Other

## 2021-08-24 DIAGNOSIS — R2681 Unsteadiness on feet: Secondary | ICD-10-CM

## 2021-08-24 DIAGNOSIS — M6281 Muscle weakness (generalized): Secondary | ICD-10-CM | POA: Diagnosis not present

## 2021-08-24 DIAGNOSIS — R29898 Other symptoms and signs involving the musculoskeletal system: Secondary | ICD-10-CM | POA: Diagnosis not present

## 2021-08-24 DIAGNOSIS — R1310 Dysphagia, unspecified: Secondary | ICD-10-CM | POA: Diagnosis not present

## 2021-08-24 DIAGNOSIS — R251 Tremor, unspecified: Secondary | ICD-10-CM | POA: Diagnosis not present

## 2021-08-24 DIAGNOSIS — R471 Dysarthria and anarthria: Secondary | ICD-10-CM

## 2021-08-24 DIAGNOSIS — R293 Abnormal posture: Secondary | ICD-10-CM

## 2021-08-24 DIAGNOSIS — R278 Other lack of coordination: Secondary | ICD-10-CM | POA: Diagnosis not present

## 2021-08-24 DIAGNOSIS — R29818 Other symptoms and signs involving the nervous system: Secondary | ICD-10-CM | POA: Diagnosis not present

## 2021-08-24 DIAGNOSIS — R2689 Other abnormalities of gait and mobility: Secondary | ICD-10-CM | POA: Diagnosis not present

## 2021-08-24 NOTE — Therapy (Signed)
[facility] ?[address], [address] ?[place], [place], [address] ?Phone: [phone]   Fax:  [phone] ? ?Speech Language Pathology Treatment/Progress note ? ?Patient Details  ?Name: Todd Rice ?MRN: [id number] ?Date of Birth: 1955-07-07 ?Referring Provider (SLP): Seymour Bars, NP ? ? ?Encounter Date: 08/24/2021 ? ? End of Session - 08/24/21 0929   ? ? Visit Number 10   ? Number of Visits 17   ? Date for SLP Re-Evaluation 09/19/21   ? SLP Start Time [phone]   ? SLP Stop Time  [phone]   ? SLP Time Calculation (min) 37 min   ? Activity Tolerance Patient tolerated treatment well   ? ?  ?  ? ?  ? ? ?Past Medical History:  ?Diagnosis Date  ? Adult BMI 50.0-59.9 kg/sq m   ? 52.77  ? Anal fissure   ? Anxiety   ? Diabetes mellitus without complication ([place])   ? borderline  ? Elevated PSA 08/13/2019  ? GERD (gastroesophageal reflux disease)   ? 30 years ago, maybe from peptic ulcer  ? H/O: gout   ? STABLE  ? Hepatic steatosis 10/01/11  ? History of kidney stones   ? Hyperlipidemia   ? Hypertension   ? Kidney tumor   ? right upper kidney tumor, monitoring  ? OSA on CPAP   ? [employer] [facility]  ? Parkinson disease ([place]) 01/04/2016  ? REM sleep behavior disorder 12/06/2016  ? Right ureteral stone   ? Ureteral calculus, right 06/12/2011  ? Vitamin D deficiency   ? Weakness   ? ? ?Past Surgical History:  ?Procedure Laterality Date  ? ANAL FISSURE REPAIR  10-12-2000  ? COLONOSCOPY N/A 07/01/2012  ? Procedure: COLONOSCOPY;  Surgeon: Lafayette Dragon, MD;  [facility];  Service: Endoscopy;  Laterality: N/A;  ? CYSTOSCOPY WITH RETROGRADE PYELOGRAM, URETEROSCOPY AND STENT PLACEMENT Bilateral 02/26/2013  ? Procedure: CYSTOSCOPY WITH RETROGRADE PYELOGRAM, URETEROSCOPY AND STENT PLACEMENT;  Surgeon: Alexis Frock, MD;  [facility];  Service: Urology;  Laterality: Bilateral;  ? HOLMIUM LASER APPLICATION Bilateral 07/37/1062  ? Procedure: HOLMIUM LASER APPLICATION;  Surgeon: Alexis Frock, MD;   [facility];  Service: Urology;  Laterality: Bilateral;  ? kidney stone removal    ? 06/2011-stent placement  ? LEFT MEDIAL FEMORAL CONDYLE DEBRIDEMENT & DRILLING/ REMOVAL LOOSE BODY  09-15-2003  ? NASAL SEPTUM SURGERY  1985  ? STONE EXTRACTION WITH BASKET  06/12/2011  ? Procedure: STONE EXTRACTION WITH BASKET;  Surgeon: Claybon Jabs, MD;  [facility];  Service: Urology;  Laterality: Right;  ? ? ?There were no vitals filed for this visit. ? ?Speech Therapy Progress Note ? ?Dates of Reporting Period: 07-11-21 to present ? ?Subjective Statement: Pt has been seen for 10 visits targeting speech conversation. ? ?Objective: See below ? ?Goal Update: See below ? ?Plan: See pt one more session to ensure consistency in speech loudness.  ? ?Reason Skilled Services are Required: see "plan" ? ? ? Subjective Assessment - 08/24/21 0847   ? ? Subjective "Todd Rice! You ready for me?"   ? Currently in Pain? No/denies   ? ?  ?  ? ?  ? ? ? ? ? ? ? ? ADULT SLP TREATMENT - 08/24/21 0851   ? ?  ? General Information  ? Behavior/Cognition Alert;Cooperative;Pleasant mood   ?  ? Treatment Provided  ? Treatment provided Cognitive-Linquistic   ?  ? Cognitive-Linquistic Treatment  ? Treatment focused  on Dysarthria   ? Skilled Treatment SLP used /a/ to habitualize louder conversational volume - average mid 90s dB, adn everyday sentences read in mid 80s dB average. Mod complex/complex conversation of 20 minutes today - pt maintained WNL volume Abdominal breathing noted 75% of the time today.  Pt and SLP agreed on one more visit in two weeks to verify progress remains consistent.   ?  ? Assessment / Recommendations / Plan  ? Plan Continue with current plan of care   ?  ? Progression Toward Goals  ? Progression toward goals Progressing toward goals   ? ?  ?  ? ?  ? ? ? ? ? SLP Short Term Goals - 08/15/21 1234   ? ?  ? SLP SHORT TERM GOAL #1  ? Title pt will maintain loud /a/ average mid 90s dB with appropriate  voicing over 4 sessions   ? Baseline 08-02-21, 08-05-21, 08-08-21   ? Status Achieved   ?  ? SLP SHORT TERM GOAL #2  ? Title pt will produce 20/20 sentences with average 70dB over two sessions   ? Baseline 08-02-21   ? Status Achieved   ?  ? SLP SHORT TERM GOAL #3  ? Title pt will demo simple conversation of 10 minutes with average 70dB over two sessions   ? Baseline 08-05-21   ? Status Achieved   ?  ? SLP SHORT TERM GOAL #4  ? Title pt will undergo cognitive linguistic evaluation and/or instrumental/objective swallow assessment PRN   ? Status Deferred   ? ?  ?  ? ?  ? ? ? SLP Long Term Goals - 08/24/21 0930   ? ?  ? SLP LONG TERM GOAL #1  ? Title pt will maintain loud /a/ at mid 90s dB with proper voicing over 6 sessions   ? Baseline 08/05/21, 08-08-21, 08/10/21, 08-24-21   ? Time 8   ? Period Weeks   ? Status On-going   ? Target Date 09/16/21   ?  ? SLP LONG TERM GOAL #2  ? Title pt will demo 15 minutes mod complex conversastion with average low 70sdB given rare nonverbal cues over three sessions   ? Baseline 08-15-21, 08-24-21   ? Time 8   ? Period Weeks   ? Status On-going   ? Target Date 09/16/21   ?  ? SLP LONG TERM GOAL #3  ? Title in the last 1-2 visits pt will verify less-frequent "losing my train of thought" compared to prior to [place]   ? Time 8   ? Period Weeks   ? Status On-going   ? Target Date 09/16/21   ?  ? SLP LONG TERM GOAL #4  ? Title pt will use abdominal breathing 80% of the time in 15 minutes mod complex conversation in 3 sessions   ? Baseline 08-24-21   ? Time 8   ? Period Weeks   ? Status On-going   ? Target Date 09/16/21   ?  ? SLP LONG TERM GOAL #5  ? Title pt will score higher than his initial score on a communication PROM in last 1-2 sessions   ? Baseline 24/30   ? Time 8   ? Period Weeks   ? Status On-going   ? ?  ?  ? ?  ? ? ? Plan - 08/24/21 0930   ? ? 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 See "  skilled  intervention" for more details of today's session. Pt reports WNL eating/drinking, denying overt s/s aspiration during meals, however pt may require objective swallow eval during this therapy course (MBSS, FEES) and signature on this plan of care will be understood to include agreement with SLP for necessity of any objective/instrumental swallow assessment. SLP believes pt will cont to benefit from skilled ST targeting incr'd volume, possibly some cognitive communication therapy to address attention in conversation, and greater usage of abdomoinal breathing in order to improve communicative effectiveness. Likely d/c next visit.   ? Speech Therapy Frequency 2x / week   ? Duration 8 weeks   ? Treatment/Interventions Environmental controls;Compensatory techniques;Functional tasks;Multimodal communcation approach;Cueing hierarchy;Language facilitation;Cognitive reorganization;Internal/external aids;Patient/family education;SLP instruction and feedback;Aspiration precaution training;Pharyngeal strengthening exercises;Oral motor exercises   ? Potential to Achieve Goals Good   ? SLP Home Exercise Plan provided   ? Consulted and Agree with Plan of Care Patient   ? ?  ?  ? ?  ? ? ?Patient will benefit from skilled therapeutic intervention in order to improve the following deficits and impairments:   ?Dysarthria and anarthria ? ?Dysphagia, unspecified type ? ? ? ?Problem List ?Patient Active Problem List  ? Diagnosis Date Noted  ? Constipation 03/24/2021  ? Gout 03/23/2021  ? Primary renal papillary carcinoma, right ([place]) 02/18/2020  ? Hepatic steatosis 02/18/2020  ? Major depression in remission ([place]) 11/11/2019  ? Central sleep apnea due to Cheyne-Stokes respiration 08/31/2019  ? Hyperlipidemia associated with type 2 diabetes mellitus ([place]) 07/09/2019  ? Aortic atherosclerosis ([place]) by CXR on 06/10/2018 06/10/2018  ? Type 2 diabetes mellitus with stage 3a chronic kidney disease, without long-term current use of insulin  ([place]) 01/21/2018  ? OSA treated with BiPAP 01/03/2017  ? REM sleep behavior disorder 12/06/2016  ? CKD stage 3 due to type 2 diabetes mellitus ([place]) 10/23/2016  ? Medication management 10/23/2016  ? Vitamin D deficiency 06

## 2021-08-25 ENCOUNTER — Telehealth: Payer: Self-pay | Admitting: Occupational Therapy

## 2021-08-25 DIAGNOSIS — G20 Parkinson's disease: Secondary | ICD-10-CM

## 2021-08-25 DIAGNOSIS — R278 Other lack of coordination: Secondary | ICD-10-CM

## 2021-08-25 DIAGNOSIS — M6281 Muscle weakness (generalized): Secondary | ICD-10-CM

## 2021-08-25 DIAGNOSIS — R2681 Unsteadiness on feet: Secondary | ICD-10-CM

## 2021-08-25 DIAGNOSIS — R2689 Other abnormalities of gait and mobility: Secondary | ICD-10-CM

## 2021-08-25 NOTE — Telephone Encounter (Signed)
Todd Rice is scheduled for return occupational therapy evaluation on 08/30/21, as recommended at his previous therapy discharge due to progressive nature of diagnosis.  Pt was in agreement with this plan.  Please send orders via Epic for occupational therapy eval and treat to our [place] location. ?  ?Thank you, ? ?Vianne Bulls, OTR/L ?[place] ?[address], [place]  [address] ?[phone] phone ?[phone] ?08/25/21 9:09 AM ? ? ? ? ? ? ?

## 2021-08-25 NOTE — Telephone Encounter (Signed)
OT orders placed as requested by OT, Vianne Bulls.  ?

## 2021-08-25 NOTE — Therapy (Signed)
[place] ?[address], [address] ?[place], [place], [address] ?Phone: [phone]   Fax:  [phone] ? ?Physical Therapy Treatment/Discharge Summary ? ?Patient Details  ?Name: Todd Rice ?MRN: [id number] ?Date of Birth: 25-Apr-1956 ?Referring Provider (PT): [place] ? ?PHYSICAL THERAPY DISCHARGE SUMMARY ? ?Visits from Start of Care: 11 ? ?Current functional level related to goals / functional outcomes: ? PT Long Term Goals - 08/24/21 0806   ? ?  ? PT LONG TERM GOAL #1  ? Title Pt will independent with progression of HEP to address Parkinson's deficits for improved balance and gait.  TARGET4/21/2023   ? Time 2   ? Period Weeks   ? Status Achieved   ?  ? PT LONG TERM GOAL #2  ? Title Pt will report walking program with walking poles at least 3x/wk for improved addition to his HEP.   ? Time 2   ? Period Weeks   ? Status Not Met   ?  ? PT LONG TERM GOAL #3  ? Title Pt will verbalize plans for continued community fitness upon d/c from PT.   ? Time 2   ? Period Weeks   ? Status Achieved   ?  ? PT LONG TERM GOAL #4  ? Title Pt will improve TUG/TUG cognitive score to less than or equal to 10% difference for improved dual tasking with gait.   ? Baseline TUG 10.91, cog 14.22;  08/10/2021:  TUG 11 sec; TUG cog 13.5 sec   ? Time 5   ? Period Weeks   ? Status Not Met   ? ?  ?  ? ?  ? ? ?  ?Remaining deficits: ?Bradykinesia, RUE tremors, decreased timing/coordination of gait (improving) ?  ?Education / Equipment: ?Pt educated in HEP, importance of consistent HEP performance, continued community fitness options.  ? ?Patient agrees to discharge. Patient goals were partially met. Patient is being discharged due to being pleased with the current functional level.  Recommend return screen in 6 months due to progressive nature of disease.   ? Mady Haagensen, PT ?08/25/21 8:21 AM ?Phone: [phone] ?Fax: [phone]  ? ? ?Encounter Date: 08/24/2021 ? ? PT End of Session - 08/24/21  0845   ? ? Visit Number 11   ? Number of Visits 12   ? Date for PT Re-Evaluation 84/53/64   per recert 10/14/319  ? Authorization Type UHC Medicare   ? PT Start Time 0802   ? PT Stop Time [phone]   ? PT Time Calculation (min) 40 min   ? Activity Tolerance Patient tolerated treatment well   ? Behavior During Therapy [facility] for tasks assessed/performed   ? ?  ?  ? ?  ? ? ?Past Medical History:  ?Diagnosis Date  ? Adult BMI 50.0-59.9 kg/sq m   ? 52.77  ? Anal fissure   ? Anxiety   ? Diabetes mellitus without complication ([place])   ? borderline  ? Elevated PSA 08/13/2019  ? GERD (gastroesophageal reflux disease)   ? 30 years ago, maybe from peptic ulcer  ? H/O: gout   ? STABLE  ? Hepatic steatosis 10/01/11  ? History of kidney stones   ? Hyperlipidemia   ? Hypertension   ? Kidney tumor   ? right upper kidney tumor, monitoring  ? OSA on CPAP   ? [employer] [facility]  ? Parkinson disease ([place]) 01/04/2016  ? REM sleep behavior disorder 12/06/2016  ? Right ureteral stone   ?  Ureteral calculus, right 06/12/2011  ? Vitamin D deficiency   ? Weakness   ? ? ?Past Surgical History:  ?Procedure Laterality Date  ? ANAL FISSURE REPAIR  10-12-2000  ? COLONOSCOPY N/A 07/01/2012  ? Procedure: COLONOSCOPY;  Surgeon: Lafayette Dragon, MD;  Location: [facility];  Service: Endoscopy;  Laterality: N/A;  ? CYSTOSCOPY WITH RETROGRADE PYELOGRAM, URETEROSCOPY AND STENT PLACEMENT Bilateral 02/26/2013  ? Procedure: CYSTOSCOPY WITH RETROGRADE PYELOGRAM, URETEROSCOPY AND STENT PLACEMENT;  Surgeon: Alexis Frock, MD;  Location: [facility];  Service: Urology;  Laterality: Bilateral;  ? HOLMIUM LASER APPLICATION Bilateral 41/32/4401  ? Procedure: HOLMIUM LASER APPLICATION;  Surgeon: Alexis Frock, MD;  Location: [facility];  Service: Urology;  Laterality: Bilateral;  ? kidney stone removal    ? 06/2011-stent placement  ? LEFT MEDIAL FEMORAL CONDYLE DEBRIDEMENT & DRILLING/ REMOVAL LOOSE BODY  09-15-2003  ? NASAL SEPTUM SURGERY  1985  ? STONE EXTRACTION WITH BASKET  06/12/2011  ?  Procedure: STONE EXTRACTION WITH BASKET;  Surgeon: Claybon Jabs, MD;  [facility];  Service: Urology;  Laterality: Right;  ? ? ?There were no vitals filed for this visit. ? ? Subjective Assessment - 08/24/21 0803   ? ? Subjective Having a "Parkinson's day".  Just moving slower today.  This happens sometimes, and it will get better.   ? Patient Stated Goals Pt's goal is to get my mind right about my Parkinson's.   ? Currently in Pain? No/denies   ? ?  ?  ? ?  ? ? ? ? ? ? ? ? ? ? ? ? ? ? ? ? ? ? ? ? [place] Adult PT Treatment/Exercise - 08/24/21 0805   ? ?  ? Transfers  ? Transfers Sit to Stand;Stand to Sit   ? Sit to Stand 6: Modified independent (Device/Increase time);Without upper extremity assist;From chair/3-in-1   ? Five time sit to stand comments  11.85   ? Stand to Sit 6: Modified independent (Device/Increase time);Without upper extremity assist;To chair/3-in-1   ?  ? Ambulation/Gait  ? Ambulation/Gait Yes   ? Ambulation/Gait Assistance 6: Modified independent (Device/Increase time)   ? Ambulation Distance (Feet) 2000 Feet   outdoor, sidewalk surfaces using bilateral walking poles  ? Assistive device None   ? Gait Pattern Step-through pattern;Decreased arm swing - right   ? Ambulation Surface Level;Indoor;Unlevel;Outdoor   ? Gait velocity 9.25 sec = 3.55 ft/sec   9.81 and 10.53 sec with cognitive tasks (3.11-3.34 ft/sec)  ? Gait Comments Gait on outdoor surfaces, using bilat walking poles . PT with cues for initial drag of poles behind and then cues for reciprocal pattern.  Pt demo self-awareness of when he gets out of sequence, and demo understanding of stoppign to reset with optimal pattern.  He does well with cues for slowed pace, large steps to maintain coordination of reciprocal arm swing longer.   ?  ? Timed Up and Go Test  ? TUG Normal TUG;Cognitive TUG   ? Normal TUG (seconds) 9.25   ? Cognitive TUG (seconds) 14.09   ? ?  ?  ? ?  ? ? ?Pt performs PWR! Moves in sitting position x  10 reps-Reviewed new addition to HEP ?  ?PWR! Up for improved posture ? ?PWR! Rock for improved weighshifting ? ?PWR! Twist for improved trunk rotation  ? ?PWR! Step for improved step initiation  ? ?Cues provided for initial slow, sustained motions, then for large amplitude, high intensity moves ? ? ? ? ?Pt  performs PWR! Moves in quadruped position x 5 reps-Reviewed new addition to HEP ?  ?PWR! Up for improved posture ? ?PWR! Rock for improved weighshifting ? ?PWR! Twist for improved trunk rotation  ? ?PWR! Step for improved step initiation  ? ?Pt able to demo understanding of the above exercises. ? ? ? ? ? ? ? ? ? ? PT Education - 08/25/21 0810   ? ? Education Details Progress towards goals, plans for d/c this visit/return screen in 6-9 months; reinforced importance of consistent exercise daily ("exercise as medicine") in order to lessen Parkinson's symtpoms/maximize functional mobility.   ? Person(s) Educated Patient   ? Methods Explanation;Demonstration   ? Comprehension Verbalized understanding;Returned demonstration   ? ?  ?  ? ?  ? ? ? PT Short Term Goals - 08/02/21 0844   ? ?  ? PT SHORT TERM GOAL #1  ? Title Pt will be independent with HEP for improved balance and gait.  TARGET 07/29/2021   ? Time 2   ? Period Weeks   ? Status Achieved   ? ?  ?  ? ?  ? ? ? ? PT Long Term Goals - 08/24/21 0806   ? ?  ? PT LONG TERM GOAL #1  ? Title Pt will independent with progression of HEP to address Parkinson's deficits for improved balance and gait.  TARGET4/21/2023   ? Time 2   ? Period Weeks   ? Status Achieved   ?  ? PT LONG TERM GOAL #2  ? Title Pt will report walking program with walking poles at least 3x/wk for improved addition to his HEP.   ? Time 2   ? Period Weeks   ? Status Not Met   ?  ? PT LONG TERM GOAL #3  ? Title Pt will verbalize plans for continued community fitness upon d/c from PT.   ? Time 2   ? Period Weeks   ? Status Achieved   ?  ? PT LONG TERM GOAL #4  ? Title Pt will improve TUG/TUG cognitive  score to less than or equal to 10% difference for improved dual tasking with gait.   ? Baseline TUG 10.91, cog 14.22;  08/10/2021:  TUG 11 sec; TUG cog 13.5 sec   ? Time 5   ? Period Weeks   ? Status Not Met   ? ?

## 2021-08-29 ENCOUNTER — Ambulatory Visit: Payer: Medicare Other

## 2021-08-30 ENCOUNTER — Encounter: Payer: Self-pay | Admitting: Occupational Therapy

## 2021-08-30 ENCOUNTER — Ambulatory Visit: Payer: Medicare Other | Admitting: Occupational Therapy

## 2021-08-30 DIAGNOSIS — R471 Dysarthria and anarthria: Secondary | ICD-10-CM | POA: Diagnosis not present

## 2021-08-30 DIAGNOSIS — R29898 Other symptoms and signs involving the musculoskeletal system: Secondary | ICD-10-CM | POA: Diagnosis not present

## 2021-08-30 DIAGNOSIS — R278 Other lack of coordination: Secondary | ICD-10-CM | POA: Diagnosis not present

## 2021-08-30 DIAGNOSIS — R2681 Unsteadiness on feet: Secondary | ICD-10-CM

## 2021-08-30 DIAGNOSIS — R251 Tremor, unspecified: Secondary | ICD-10-CM

## 2021-08-30 DIAGNOSIS — R293 Abnormal posture: Secondary | ICD-10-CM | POA: Diagnosis not present

## 2021-08-30 DIAGNOSIS — R29818 Other symptoms and signs involving the nervous system: Secondary | ICD-10-CM

## 2021-08-30 DIAGNOSIS — R2689 Other abnormalities of gait and mobility: Secondary | ICD-10-CM | POA: Diagnosis not present

## 2021-08-30 DIAGNOSIS — M6281 Muscle weakness (generalized): Secondary | ICD-10-CM | POA: Diagnosis not present

## 2021-08-30 DIAGNOSIS — R1310 Dysphagia, unspecified: Secondary | ICD-10-CM | POA: Diagnosis not present

## 2021-08-30 NOTE — Therapy (Signed)
? ?OUTPATIENT OCCUPATIONAL THERAPY PARKINSON'S EVALUATION ? ?Patient Name: Todd Rice ?MRN: [id number] ?DOB:10-15-1955, 66 y.o., male ?Today's Date: 08/30/2021 ? ?PCP: Unk Pinto, MD ?REFERRING PROVIDER: Ernestine Mcmurray, NP ? ? OT End of Session - 08/30/21 1600   ? ? Visit Number 1   ? Number of Visits 17   ? Date for OT Re-Evaluation 11/28/21   ? Authorization Type UHC Medicare, follows Medicare guidelines   ? Authorization - Number of Visits 1   ? Progress Note Due on Visit 10   ? OT Start Time [phone]   ? OT Stop Time 0805   ? OT Time Calculation (min) 47 min   ? Activity Tolerance Patient tolerated treatment well   ? Behavior During Therapy [facility] for tasks assessed/performed   ? ?  ?  ? ?  ? ? ?Past Medical History:  ?Diagnosis Date  ? Adult BMI 50.0-59.9 kg/sq m   ? 52.77  ? Anal fissure   ? Anxiety   ? Diabetes mellitus without complication ([place])   ? borderline  ? Elevated PSA 08/13/2019  ? GERD (gastroesophageal reflux disease)   ? 30 years ago, maybe from peptic ulcer  ? H/O: gout   ? STABLE  ? Hepatic steatosis 10/01/11  ? History of kidney stones   ? Hyperlipidemia   ? Hypertension   ? Kidney tumor   ? right upper kidney tumor, monitoring  ? OSA on CPAP   ? [employer] [facility]  ? Parkinson disease ([place]) 01/04/2016  ? REM sleep behavior disorder 12/06/2016  ? Right ureteral stone   ? Ureteral calculus, right 06/12/2011  ? Vitamin D deficiency   ? Weakness   ? ?Past Surgical History:  ?Procedure Laterality Date  ? ANAL FISSURE REPAIR  10-12-2000  ? COLONOSCOPY N/A 07/01/2012  ? Procedure: COLONOSCOPY;  Surgeon: Lafayette Dragon, MD;  [facility];  Service: Endoscopy;  Laterality: N/A;  ? CYSTOSCOPY WITH RETROGRADE PYELOGRAM, URETEROSCOPY AND STENT PLACEMENT Bilateral 02/26/2013  ? Procedure: CYSTOSCOPY WITH RETROGRADE PYELOGRAM, URETEROSCOPY AND STENT PLACEMENT;  Surgeon: Alexis Frock, MD;  [facility];  Service: Urology;  Laterality: Bilateral;  ? HOLMIUM LASER APPLICATION Bilateral 28/31/5176  ?  Procedure: HOLMIUM LASER APPLICATION;  Surgeon: Alexis Frock, MD;  [facility];  Service: Urology;  Laterality: Bilateral;  ? kidney stone removal    ? 06/2011-stent placement  ? LEFT MEDIAL FEMORAL CONDYLE DEBRIDEMENT & DRILLING/ REMOVAL LOOSE BODY  09-15-2003  ? NASAL SEPTUM SURGERY  1985  ? STONE EXTRACTION WITH BASKET  06/12/2011  ? Procedure: STONE EXTRACTION WITH BASKET;  Surgeon: Claybon Jabs, MD;  Location: [facility];  Service: Urology;  Laterality: Right;  ? ?Patient Active Problem List  ? Diagnosis Date Noted  ? Constipation 03/24/2021  ? Gout 03/23/2021  ? Primary renal papillary carcinoma, right ([place]) 02/18/2020  ? Hepatic steatosis 02/18/2020  ? Major depression in remission ([place]) 11/11/2019  ? Central sleep apnea due to Cheyne-Stokes respiration 08/31/2019  ? Hyperlipidemia associated with type 2 diabetes mellitus ([place]) 07/09/2019  ? Aortic atherosclerosis ([place]) by CXR on 06/10/2018 06/10/2018  ? Type 2 diabetes mellitus with stage 3a chronic kidney disease, without long-term current use of insulin ([place]) 01/21/2018  ? OSA treated with BiPAP 01/03/2017  ? REM sleep behavior disorder 12/06/2016  ? CKD stage 3 due to type 2 diabetes mellitus ([place]) 10/23/2016  ? Medication management 10/23/2016  ? Vitamin D deficiency 10/23/2016  ? Parkinson's disease ([place]) 01/04/2016  ? Seasonal and  perennial allergic rhinitis 06/21/2014  ? Asthma, mild intermittent, well-controlled 06/21/2014  ? Pseudomonas aeruginosa colonization 10/08/2012  ? CAD (coronary artery disease) 11/15/2011  ? Essential hypertension 11/15/2011  ? Morbid obesity with BMI of 40.0-44.9, adult ([place]) 11/15/2011  ? ? ?ONSET DATE: 08/25/21 (referral date) ? ?REFERRING DIAG: Parkinson's Disease ? ?THERAPY DIAG:  ?Other lack of coordination ? ?Other symptoms and signs involving the nervous system ? ?Other symptoms and signs involving the musculoskeletal system ? ?Abnormal posture ? ?Tremor ? ?Unsteadiness on feet ? ?SUBJECTIVE:   ? ?SUBJECTIVE STATEMENT: ?Pt reports incr tremors and incr rigidity in RUE ? ?Pt accompanied by: self ? ?PERTINENT HISTORY:  Parkinson's Disease, CAD, HTN, HDL, obesity, asthma, DM, CKD, vitamin D deficiency, REM sleep disorder, OSA, aortic atherosclerosis, hx of gout, GERD, hx of anxiety ? ? ?PRECAUTIONS: None ? ?WEIGHT BEARING RESTRICTIONS No ? ?PAIN:  ?Are you having pain? No ? ?FALLS: Has patient fallen in last 6 months? No ? ?LIVING ENVIRONMENT: ?Lives with: lives with their spouse ? ?PLOF: Independent, Vocation/Vocational requirements: retired, and Leisure: will start PWR! Moves tomorrow, has been going to [employer]. ? ?PATIENT GOALS improve R hand use ? ?OBJECTIVE:  ? ?HAND DOMINANCE: Right ? ?ADLs: ? ?Transfers/ambulation related to ADLs:  doesn't get in the floor, min difficulty getting in/out of bed ?Eating: mild difficulty with cutting food at times, eating with RUE (notices that he uses his shoulder more) ?Grooming: uses both hands to shave and brushing teeth ?UB Dressing: mod I, doesn't wear buttons ?LB Dressing: mod I, doesn't wear socks, uses shoe horn ?Toileting: mod I ?Bathing: mod I, uses back scrubber ?Tub Shower transfers: mod I ?Equipment:  bathtub/shower combo ? ? ?IADLs: ?Shopping: mod I ?Light housekeeping: wife does most, pt helps--mod I ?Meal Prep: wife does most, pt helps--mod I ?Community mobility: drives  ?Medication management: independent ?Financial management: wife has always performed ?Handwriting: 100% legible, Moderate-severe micrographia, printing.  Severe micrographia with cursive ? ?MOBILITY STATUS: Independent, mild hesitation with RLE ? ?POSTURE COMMENTS:  ?rounded shoulders, flexed trunk , and weight shift right ? ?FUNCTIONAL OUTCOME MEASURES: ?Standing functional reach: Right: 10 inches; Left: 11 inches ?Fastening/unfastening 3 buttons: 50.28sec, 38.06sec.   ?Physical performance test: PPT#2 (simulated eating) 13.63sec (gross grasp on spoon)  & PPT#4 (donning/doffing  jacket): 15.13sec with posterior lean with doffing. ? ?COORDINATION: ?9 Hole Peg test: Right: 53.94sec uses shoulder compensation vs finger for positioning; Left: 43.0 sec ?Box and Blocks:  Right 43 blocks, Left 42 blocks (leans to the R) ?Tremors: action, Right, and Left ? ?UE ROM:   R shoulder flex 145* with -5* elbow ext, L shoulder flex 145* with elbow ext WNL ? ?UE MMT:   Not tested ? ?SENSATION: ?Not tested ? ? ?MUSCLE TONE: RUE: Moderate and Rigidity and LUE: Mild ? ?COGNITION: ?Overall cognitive status: Within functional limits for tasks assessed ? ? ?OBSERVATIONS: Bradykinesia ? ? ?PATIENT EDUCATION: ?Education details: Discussed eval results/POC, discussed medication timing and made recommendations ?Person educated: Patient ?Education method: Explanation ?Education comprehension: verbalized understanding ? ? ?HOME EXERCISE PROGRAM: ?Not yet issued  ? ?ASSESSMENT: ? ?CLINICAL IMPRESSION: ?Patient is a 66 y.o. male who was seen today for occupational therapy evaluation for Parkinson's Disease.  Pt is familiar to this therapist.  Pt was last seen for occupational therapy 02/2021 and it was recommended at discharge that pt return for re-evaluation in approx. 6 months. Pt with PMH that includes CAD, HTN, HDL, obesity, asthma, DM, CKD, vitamin D deficiency, REM sleep disorder, OSA, aortic atherosclerosis,  hx of gout, GERD, hx of anxiety.  Pt presents with bradykinesia, rigidity, decr coordination, tremor, abnormal posture, decr balance for ADLs.  Pt would benefit from occupational therapy to address these deficits in order to improve ADL/IADL performance, improve quality of life, update PD-specific HEP, improve dominant RUE functional use, and prevent future complications.   ? ?PERFORMANCE DEFICITS in functional skills including ADLs, IADLs, coordination, dexterity, tone, ROM, FMC, GMC, balance, and UE functional use, cognitive skills including  n/a , and psychosocial skills including habits and routines and  behaviors.  ? ?IMPAIRMENTS are limiting patient from ADLs, IADLs, and leisure.  ? ?COMORBIDITIES may have co-morbidities  that affects occupational performance. Patient will benefit from skilled OT to address above impa

## 2021-09-01 ENCOUNTER — Encounter: Payer: Medicare Other | Admitting: Occupational Therapy

## 2021-09-04 ENCOUNTER — Other Ambulatory Visit: Payer: Self-pay

## 2021-09-05 ENCOUNTER — Other Ambulatory Visit (HOSPITAL_BASED_OUTPATIENT_CLINIC_OR_DEPARTMENT_OTHER): Payer: Self-pay

## 2021-09-05 NOTE — Therapy (Signed)
?OUTPATIENT OCCUPATIONAL THERAPY TREATMENT NOTE ? ? ?Patient Name: Todd Rice ?MRN: [id number] ?DOB:09-10-1955, 66 y.o., male ?Today's Date: 09/06/2021 ? ?PCP: Unk Pinto, MD  ?REFERRING PROVIDER: Ernestine Mcmurray, NP  ? ?END OF SESSION:  ? OT End of Session - 09/06/21 4431   ? ? Visit Number 2   ? Number of Visits 17   ? Date for OT Re-Evaluation 11/28/21   ? Authorization Type UHC Medicare, follows Medicare guidelines   ? Authorization - Number of Visits 2   ? Progress Note Due on Visit 10   ? OT Start Time [phone]   ? OT Stop Time 0800   ? OT Time Calculation (min) 41 min   ? Activity Tolerance Patient tolerated treatment well   ? Behavior During Therapy [facility] for tasks assessed/performed   ? ?  ?  ? ?  ? ? ?Past Medical History:  ?Diagnosis Date  ? Adult BMI 50.0-59.9 kg/sq m   ? 52.77  ? Anal fissure   ? Anxiety   ? Diabetes mellitus without complication ([place])   ? borderline  ? Elevated PSA 08/13/2019  ? GERD (gastroesophageal reflux disease)   ? 30 years ago, maybe from peptic ulcer  ? H/O: gout   ? STABLE  ? Hepatic steatosis 10/01/11  ? History of kidney stones   ? Hyperlipidemia   ? Hypertension   ? Kidney tumor   ? right upper kidney tumor, monitoring  ? OSA on CPAP   ? [employer] [facility]  ? Parkinson disease ([place]) 01/04/2016  ? REM sleep behavior disorder 12/06/2016  ? Right ureteral stone   ? Ureteral calculus, right 06/12/2011  ? Vitamin D deficiency   ? Weakness   ? ?Past Surgical History:  ?Procedure Laterality Date  ? ANAL FISSURE REPAIR  10-12-2000  ? COLONOSCOPY N/A 07/01/2012  ? Procedure: COLONOSCOPY;  Surgeon: Lafayette Dragon, MD;  [facility]: [facility];  Service: Endoscopy;  Laterality: N/A;  ? CYSTOSCOPY WITH RETROGRADE PYELOGRAM, URETEROSCOPY AND STENT PLACEMENT Bilateral 02/26/2013  ? Procedure: CYSTOSCOPY WITH RETROGRADE PYELOGRAM, URETEROSCOPY AND STENT PLACEMENT;  Surgeon: Alexis Frock, MD;  Location: [facility];  Service: Urology;  Laterality: Bilateral;  ? HOLMIUM LASER APPLICATION Bilateral 86/76/1951   ? Procedure: HOLMIUM LASER APPLICATION;  Surgeon: Alexis Frock, MD;  Location: [facility];  Service: Urology;  Laterality: Bilateral;  ? kidney stone removal    ? 06/2011-stent placement  ? LEFT MEDIAL FEMORAL CONDYLE DEBRIDEMENT & DRILLING/ REMOVAL LOOSE BODY  09-15-2003  ? NASAL SEPTUM SURGERY  1985  ? STONE EXTRACTION WITH BASKET  06/12/2011  ? Procedure: STONE EXTRACTION WITH BASKET;  Surgeon: Claybon Jabs, MD;  Location: [facility];  Service: Urology;  Laterality: Right;  ? ?Patient Active Problem List  ? Diagnosis Date Noted  ? Constipation 03/24/2021  ? Gout 03/23/2021  ? Primary renal papillary carcinoma, right ([place]) 02/18/2020  ? Hepatic steatosis 02/18/2020  ? Major depression in remission ([place]) 11/11/2019  ? Central sleep apnea due to Cheyne-Stokes respiration 08/31/2019  ? Hyperlipidemia associated with type 2 diabetes mellitus ([place]) 07/09/2019  ? Aortic atherosclerosis ([place]) by CXR on 06/10/2018 06/10/2018  ? Type 2 diabetes mellitus with stage 3a chronic kidney disease, without long-term current use of insulin ([place]) 01/21/2018  ? OSA treated with BiPAP 01/03/2017  ? REM sleep behavior disorder 12/06/2016  ? CKD stage 3 due to type 2 diabetes mellitus ([place]) 10/23/2016  ? Medication management 10/23/2016  ? Vitamin D deficiency 10/23/2016  ? Parkinson's disease (  [place]) 01/04/2016  ? Seasonal and perennial allergic rhinitis 06/21/2014  ? Asthma, mild intermittent, well-controlled 06/21/2014  ? Pseudomonas aeruginosa colonization 10/08/2012  ? CAD (coronary artery disease) 11/15/2011  ? Essential hypertension 11/15/2011  ? Morbid obesity with BMI of 40.0-44.9, adult ([place]) 11/15/2011  ? ? ?ONSET DATE: 08/25/21 (referral date)  ? ?REFERRING DIAG: Parkinson's Disease  ? ?THERAPY DIAG:  ?Other lack of coordination ? ?Other symptoms and signs involving the nervous system ? ?Other symptoms and signs involving the musculoskeletal system ? ?Abnormal posture ? ?Tremor ? ?Unsteadiness on feet ? ?Other  abnormalities of gait and mobility ? ? ?PERTINENT HISTORY: Parkinson's Disease, CAD, HTN, HDL, obesity, asthma, DM, CKD, vitamin D deficiency, REM sleep disorder, OSA, aortic atherosclerosis, hx of gout, GERD, hx of anxiety  ? ?PRECAUTIONS: None  ? ?SUBJECTIVE: I got down on the floor in class last week and got back up myself ? ?PAIN:  ?Are you having pain? No ? ? ? ? ?OBJECTIVE:  ? ?TODAY'S TREATMENT: ? ?Sitting, closed-chain shoulder flex (floor>overhead) and then diagonals to each side with min cueing for incr movement amplitude.  Tossing/catching ball with BUEs with use of large amplitude movements with min cueing. ? ?Flipping cards for Bilateral coordination (both hands simultaneously)  with min v.c. and for timing. ? ?Dealing cards with thumb with mod-max difficulty/cues with R hand and min difficulty L hand. ? ?With both hands (cueing for large amplitude):  with palm on table, individual finger lifts with abduction/adduction, then isolated/individual finger lifts for incr MP ext. With min v.c. for incr movement amplitude.  Isolated IP flexion/ext for decr rigidity of hands with min v.c.  Thumb circles (bilateral coordination), thumb extension/radial abduction with min-mod cueing for large movements, thumb AAROM radial abduction to slide card with min v.c. ? ?PWR! Up in sitting to prayer stretch for wrist/finger composite stretch with min cueing.   ? ?Sitting, "slapping" table for incr proprioceptive input and hand stretch with emphasis on large amplitude movement.   ? ? ? ? ? ? ?PATIENT EDUCATION: ?Education details: n/a today ?Person educated:  ?Education method:  ?Education comprehension:  ?  ?  ?HOME EXERCISE PROGRAM: ?Not yet issued  ? ?CLINICAL IMPRESSION: ?Pt is progressing towards goals with good response to cueing for larger amplitude movement.   ?  ?PERFORMANCE DEFICITS in functional skills including ADLs, IADLs, coordination, dexterity, tone, ROM, FMC, GMC, balance, and UE functional use, cognitive  skills including  n/a , and psychosocial skills including habits and routines and behaviors.  ?  ?IMPAIRMENTS are limiting patient from ADLs, IADLs, and leisure.  ?  ?COMORBIDITIES may have co-morbidities  that affects occupational performance. Patient will benefit from skilled OT to address above impairments and improve overall function. ?  ?MODIFICATION OR ASSISTANCE TO COMPLETE EVALUATION: Min-Moderate modification of tasks or assist with assess necessary to complete an evaluation. ?  ?OT OCCUPATIONAL PROFILE AND HISTORY: Detailed assessment: Review of records and additional review of physical, cognitive, psychosocial history related to current functional performance. ?  ?CLINICAL DECISION MAKING: Moderate - several treatment options, min-mod task modification necessary ?  ?REHAB POTENTIAL: Good ?  ?EVALUATION COMPLEXITY: Moderate ?  ?  ?  ?GOALS: ?Potential Goals reviewed with patient? Yes  ?  ?SHORT TERM GOALS: Target date: 09/27/2021 ?  ?Pt will be independent with updated PD-specific HEP. ?Baseline: ?Goal status: INITIAL ?  ?2.  Pt will improve bilateral coordination to be able to fasten/unfasten 3 buttons in 34sec or less. ?Baseline:  50, 38.06sec ?Goal status:  INITIAL ?  ?3.  Pt will be able to write at least 3 sentences with only min decr in size. ?  ?Goal status: INITIAL ?  ?4.  Pt will improve R hand coordination for ADLs/IADLs as shown by completing 9-hole peg test in 49sec or less. ?Baseline:  53.94sec ?Goal status: INITIAL ?  ?  ?LONG TERM GOALS: Target date: 10/25/2021 ?  ?Pt will verbalize understanding of strategies for ADLs/IADLs to incr ease, incr safety and decr risk of future complications (including strategies for eating, writing, shaving, reaching). ?  ?Goal status: INITIAL ?  ?2.   Pt will improve coordination/functional reaching with RUE as shown by improving score on box and blocks test by at least 3. ?Baseline:  43 blocks ?Goal status: INITIAL ?  ?3.  Pt will improve L hand coordination for  ADLs/IADLs as shown by completing 9-hole peg test in 39sec or less. ?Baseline: 43sec ?Goal status: INITIAL ?  ?4.  Pt will improve R hand coordination for ADLs/IADLs as shown by completing 9-hole peg t

## 2021-09-06 ENCOUNTER — Other Ambulatory Visit: Payer: Self-pay

## 2021-09-06 ENCOUNTER — Ambulatory Visit: Payer: Medicare Other | Attending: Adult Health | Admitting: Occupational Therapy

## 2021-09-06 ENCOUNTER — Encounter: Payer: Self-pay | Admitting: Occupational Therapy

## 2021-09-06 ENCOUNTER — Encounter (HOSPITAL_BASED_OUTPATIENT_CLINIC_OR_DEPARTMENT_OTHER): Payer: Self-pay

## 2021-09-06 ENCOUNTER — Emergency Department (HOSPITAL_BASED_OUTPATIENT_CLINIC_OR_DEPARTMENT_OTHER): Payer: Medicare Other | Admitting: Radiology

## 2021-09-06 ENCOUNTER — Other Ambulatory Visit (HOSPITAL_BASED_OUTPATIENT_CLINIC_OR_DEPARTMENT_OTHER): Payer: Self-pay

## 2021-09-06 ENCOUNTER — Other Ambulatory Visit: Payer: Self-pay | Admitting: Adult Health

## 2021-09-06 DIAGNOSIS — R278 Other lack of coordination: Secondary | ICD-10-CM | POA: Insufficient documentation

## 2021-09-06 DIAGNOSIS — E119 Type 2 diabetes mellitus without complications: Secondary | ICD-10-CM

## 2021-09-06 DIAGNOSIS — Z79899 Other long term (current) drug therapy: Secondary | ICD-10-CM | POA: Diagnosis not present

## 2021-09-06 DIAGNOSIS — M79641 Pain in right hand: Secondary | ICD-10-CM | POA: Diagnosis not present

## 2021-09-06 DIAGNOSIS — R2689 Other abnormalities of gait and mobility: Secondary | ICD-10-CM | POA: Diagnosis not present

## 2021-09-06 DIAGNOSIS — R471 Dysarthria and anarthria: Secondary | ICD-10-CM | POA: Diagnosis not present

## 2021-09-06 DIAGNOSIS — R2681 Unsteadiness on feet: Secondary | ICD-10-CM | POA: Insufficient documentation

## 2021-09-06 DIAGNOSIS — Y92511 Restaurant or cafe as the place of occurrence of the external cause: Secondary | ICD-10-CM | POA: Diagnosis not present

## 2021-09-06 DIAGNOSIS — R29898 Other symptoms and signs involving the musculoskeletal system: Secondary | ICD-10-CM | POA: Diagnosis not present

## 2021-09-06 DIAGNOSIS — Z7984 Long term (current) use of oral hypoglycemic drugs: Secondary | ICD-10-CM | POA: Insufficient documentation

## 2021-09-06 DIAGNOSIS — S66126A Laceration of flexor muscle, fascia and tendon of right little finger at wrist and hand level, initial encounter: Secondary | ICD-10-CM | POA: Insufficient documentation

## 2021-09-06 DIAGNOSIS — Y9301 Activity, walking, marching and hiking: Secondary | ICD-10-CM | POA: Diagnosis not present

## 2021-09-06 DIAGNOSIS — R293 Abnormal posture: Secondary | ICD-10-CM | POA: Diagnosis not present

## 2021-09-06 DIAGNOSIS — R251 Tremor, unspecified: Secondary | ICD-10-CM | POA: Insufficient documentation

## 2021-09-06 DIAGNOSIS — W010XXA Fall on same level from slipping, tripping and stumbling without subsequent striking against object, initial encounter: Secondary | ICD-10-CM | POA: Insufficient documentation

## 2021-09-06 DIAGNOSIS — R1310 Dysphagia, unspecified: Secondary | ICD-10-CM | POA: Diagnosis not present

## 2021-09-06 DIAGNOSIS — Z23 Encounter for immunization: Secondary | ICD-10-CM | POA: Diagnosis not present

## 2021-09-06 DIAGNOSIS — S61216A Laceration without foreign body of right little finger without damage to nail, initial encounter: Secondary | ICD-10-CM | POA: Diagnosis not present

## 2021-09-06 DIAGNOSIS — G20 Parkinson's disease: Secondary | ICD-10-CM | POA: Diagnosis not present

## 2021-09-06 DIAGNOSIS — I10 Essential (primary) hypertension: Secondary | ICD-10-CM | POA: Insufficient documentation

## 2021-09-06 DIAGNOSIS — R41841 Cognitive communication deficit: Secondary | ICD-10-CM | POA: Insufficient documentation

## 2021-09-06 DIAGNOSIS — M7989 Other specified soft tissue disorders: Secondary | ICD-10-CM | POA: Diagnosis not present

## 2021-09-06 DIAGNOSIS — Z7982 Long term (current) use of aspirin: Secondary | ICD-10-CM | POA: Insufficient documentation

## 2021-09-06 DIAGNOSIS — I2510 Atherosclerotic heart disease of native coronary artery without angina pectoris: Secondary | ICD-10-CM | POA: Diagnosis not present

## 2021-09-06 DIAGNOSIS — R29818 Other symptoms and signs involving the nervous system: Secondary | ICD-10-CM | POA: Insufficient documentation

## 2021-09-06 MED ORDER — METFORMIN HCL ER 500 MG PO TB24
ORAL_TABLET | Freq: Two times a day (BID) | ORAL | 3 refills | Status: DC
  Filled 2021-09-06: qty 360, 90d supply, fill #0
  Filled 2021-12-19: qty 360, 90d supply, fill #1
  Filled 2022-03-15: qty 360, 90d supply, fill #2
  Filled 2022-06-27: qty 360, 90d supply, fill #3

## 2021-09-06 NOTE — ED Triage Notes (Signed)
Patient here POV from Home. ? ?Endorses falling at [employer] after tripping on [employer] approximately 1.5 hours ago. ? ?Small 3 cm Laceration to Proximal Anterior Fifth Digit. Swelling and Bruising to Posterior Hand. ? ?NAD Noted during Triage. A&Ox4. GCS 15. Ambulatory ?

## 2021-09-06 NOTE — Therapy (Incomplete)
?OUTPATIENT OCCUPATIONAL THERAPY TREATMENT NOTE ? ? ?Patient Name: Todd Rice ?MRN: [id number] ?DOB:[birth year], 66 y.o., male ?Today's Date: 09/06/2021 ? ?PCP: Unk Pinto, MD  ?REFERRING PROVIDER: Ernestine Mcmurray, NP  ? ?END OF SESSION:  ? ? ? ?Past Medical History:  ?Diagnosis Date  ? Adult BMI 50.0-59.9 kg/sq m   ? 52.77  ? Anal fissure   ? Anxiety   ? Diabetes mellitus without complication ([place])   ? borderline  ? Elevated PSA 08/13/2019  ? GERD (gastroesophageal reflux disease)   ? 30 years ago, maybe from peptic ulcer  ? H/O: gout   ? STABLE  ? Hepatic steatosis 10/01/11  ? History of kidney stones   ? Hyperlipidemia   ? Hypertension   ? Kidney tumor   ? right upper kidney tumor, monitoring  ? OSA on CPAP   ? [employer] [facility]  ? Parkinson disease ([place]) 01/04/2016  ? REM sleep behavior disorder 12/06/2016  ? Right ureteral stone   ? Ureteral calculus, right 06/12/2011  ? Vitamin D deficiency   ? Weakness   ? ?Past Surgical History:  ?Procedure Laterality Date  ? ANAL FISSURE REPAIR  10-12-2000  ? COLONOSCOPY N/A 07/01/2012  ? Procedure: COLONOSCOPY;  Surgeon: Lafayette Dragon, MD;  [facility];  Service: Endoscopy;  Laterality: N/A;  ? CYSTOSCOPY WITH RETROGRADE PYELOGRAM, URETEROSCOPY AND STENT PLACEMENT Bilateral 02/26/2013  ? Procedure: CYSTOSCOPY WITH RETROGRADE PYELOGRAM, URETEROSCOPY AND STENT PLACEMENT;  Surgeon: Alexis Frock, MD;  [facility];  Service: Urology;  Laterality: Bilateral;  ? HOLMIUM LASER APPLICATION Bilateral 36/14/4315  ? Procedure: HOLMIUM LASER APPLICATION;  Surgeon: Alexis Frock, MD;  [facility];  Service: Urology;  Laterality: Bilateral;  ? kidney stone removal    ? 06/2011-stent placement  ? LEFT MEDIAL FEMORAL CONDYLE DEBRIDEMENT & DRILLING/ REMOVAL LOOSE BODY  09-15-2003  ? NASAL SEPTUM SURGERY  1985  ? STONE EXTRACTION WITH BASKET  06/12/2011  ? Procedure: STONE EXTRACTION WITH BASKET;  Surgeon: Claybon Jabs, MD;  Location[facility];  Service:  Urology;  Laterality: Right;  ? ?Patient Active Problem List  ? Diagnosis Date Noted  ? Constipation 03/24/2021  ? Gout 03/23/2021  ? Primary renal papillary carcinoma, right ([place]) 02/18/2020  ? Hepatic steatosis 02/18/2020  ? Major depression in remission ([place]) 11/11/2019  ? Central sleep apnea due to Cheyne-Stokes respiration 08/31/2019  ? Hyperlipidemia associated with type 2 diabetes mellitus ([place]) 07/09/2019  ? Aortic atherosclerosis ([place]) by CXR on 06/10/2018 06/10/2018  ? Type 2 diabetes mellitus with stage 3a chronic kidney disease, without long-term current use of insulin ([place]) 01/21/2018  ? OSA treated with BiPAP 01/03/2017  ? REM sleep behavior disorder 12/06/2016  ? CKD stage 3 due to type 2 diabetes mellitus ([place]) 10/23/2016  ? Medication management 10/23/2016  ? Vitamin D deficiency 10/23/2016  ? Parkinson's disease ([place]) 01/04/2016  ? Seasonal and perennial allergic rhinitis 06/21/2014  ? Asthma, mild intermittent, well-controlled 06/21/2014  ? Pseudomonas aeruginosa colonization 10/08/2012  ? CAD (coronary artery disease) 11/15/2011  ? Essential hypertension 11/15/2011  ? Morbid obesity with BMI of 40.0-44.9, adult ([place]) 11/15/2011  ? ? ?ONSET DATE: 08/25/21 (referral date)  ? ?REFERRING DIAG: Parkinson's Disease  ? ?THERAPY DIAG:  ?No diagnosis found. ? ? ?PERTINENT HISTORY: Parkinson's Disease, CAD, HTN, HDL, obesity, asthma, DM, CKD, vitamin D deficiency, REM sleep disorder, OSA, aortic atherosclerosis, hx of gout, GERD, hx of anxiety  ? ?PRECAUTIONS: None  ? ?SUBJECTIVE: *** I got down on  the floor in class last week and got back up myself ? ?PAIN:  ?Are you having pain? No ? ? ? ? ?OBJECTIVE:  ? ?TODAY'S TREATMENT: ? ?*** ? ?Sitting, closed-chain shoulder flex (floor>overhead) and then diagonals to each side with min cueing for incr movement amplitude.  Tossing/catching ball with BUEs with use of large amplitude movements with min cueing. ? ?Flipping cards for Bilateral coordination (both hands  simultaneously)  with min v.c. and for timing. ? ?Dealing cards with thumb with mod-max difficulty/cues with R hand and min difficulty L hand. ? ?With both hands (cueing for large amplitude):  with palm on table, individual finger lifts with abduction/adduction, then isolated/individual finger lifts for incr MP ext. With min v.c. for incr movement amplitude.  Isolated IP flexion/ext for decr rigidity of hands with min v.c.  Thumb circles (bilateral coordination), thumb extension/radial abduction with min-mod cueing for large movements, thumb AAROM radial abduction to slide card with min v.c. ? ?PWR! Up in sitting to prayer stretch for wrist/finger composite stretch with min cueing.   ? ?Sitting, "slapping" table for incr proprioceptive input and hand stretch with emphasis on large amplitude movement.   ? ? ? ? ? ? ?PATIENT EDUCATION: ?Education details: n/a today ?Person educated:  ?Education method:  ?Education comprehension:  ?  ?  ?HOME EXERCISE PROGRAM: ?Not yet issued  ? ?CLINICAL IMPRESSION: ?*** Pt is progressing towards goals with good response to cueing for larger amplitude movement.   ?  ?PERFORMANCE DEFICITS in functional skills including ADLs, IADLs, coordination, dexterity, tone, ROM, FMC, GMC, balance, and UE functional use, cognitive skills including  n/a , and psychosocial skills including habits and routines and behaviors.  ?  ?IMPAIRMENTS are limiting patient from ADLs, IADLs, and leisure.  ?  ?COMORBIDITIES may have co-morbidities  that affects occupational performance. Patient will benefit from skilled OT to address above impairments and improve overall function. ?  ?MODIFICATION OR ASSISTANCE TO COMPLETE EVALUATION: Min-Moderate modification of tasks or assist with assess necessary to complete an evaluation. ?  ?OT OCCUPATIONAL PROFILE AND HISTORY: Detailed assessment: Review of records and additional review of physical, cognitive, psychosocial history related to current functional  performance. ?  ?CLINICAL DECISION MAKING: Moderate - several treatment options, min-mod task modification necessary ?  ?REHAB POTENTIAL: Good ?  ?EVALUATION COMPLEXITY: Moderate ?  ?  ?  ?GOALS: ?Potential Goals reviewed with patient? Yes  ?  ?SHORT TERM GOALS: Target date: 09/27/2021 ?  ?Pt will be independent with updated PD-specific HEP. ?Baseline: ?Goal status: INITIAL ?  ?2.  Pt will improve bilateral coordination to be able to fasten/unfasten 3 buttons in 34sec or less. ?Baseline:  50, 38.06sec ?Goal status: INITIAL ?  ?3.  Pt will be able to write at least 3 sentences with only min decr in size. ?  ?Goal status: INITIAL ?  ?4.  Pt will improve R hand coordination for ADLs/IADLs as shown by completing 9-hole peg test in 49sec or less. ?Baseline:  53.94sec ?Goal status: INITIAL ?  ?  ?LONG TERM GOALS: Target date: 10/25/2021 ?  ?Pt will verbalize understanding of strategies for ADLs/IADLs to incr ease, incr safety and decr risk of future complications (including strategies for eating, writing, shaving, reaching). ?  ?Goal status: INITIAL ?  ?2.   Pt will improve coordination/functional reaching with RUE as shown by improving score on box and blocks test by at least 3. ?Baseline:  43 blocks ?Goal status: INITIAL ?  ?3.  Pt will improve L hand  coordination for ADLs/IADLs as shown by completing 9-hole peg test in 39sec or less. ?Baseline: 43sec ?Goal status: INITIAL ?  ?4.  Pt will improve R hand coordination for ADLs/IADLs as shown by completing 9-hole peg test in 45sec or less. ?Baseline:  53.94sec ?Goal status: INITIAL ?  ?5.  Pt will improve coordination/functional reaching with LUE as shown by improving score on box and blocks test by at least 3. ?Baseline:  42 blocks ?Goal status: INITIAL ?  ?  ?PLAN: ?OT FREQUENCY: 2x/week ?  ?OT DURATION: 8 weeks or 16 visits over 12 weeks due to scheduling +eval ?  ?PLANNED INTERVENTIONS: self care/ADL training, therapeutic exercise, therapeutic activity, neuromuscular  re-education, manual therapy, manual lymph drainage, passive range of motion, balance training, functional mobility training, aquatic therapy, splinting, electrical stimulation, ultrasound, paraffin, fluidotherapy, moist

## 2021-09-07 ENCOUNTER — Other Ambulatory Visit (HOSPITAL_BASED_OUTPATIENT_CLINIC_OR_DEPARTMENT_OTHER): Payer: Self-pay

## 2021-09-07 ENCOUNTER — Emergency Department (HOSPITAL_BASED_OUTPATIENT_CLINIC_OR_DEPARTMENT_OTHER)
Admission: EM | Admit: 2021-09-07 | Discharge: 2021-09-07 | Disposition: A | Discharge status: Home / Self Care | Payer: Medicare Other | Attending: Emergency Medicine | Admitting: Emergency Medicine

## 2021-09-07 ENCOUNTER — Telehealth: Payer: Self-pay | Admitting: Adult Health

## 2021-09-07 DIAGNOSIS — S61401A Unspecified open wound of right hand, initial encounter: Secondary | ICD-10-CM

## 2021-09-07 MED ORDER — TETANUS-DIPHTH-ACELL PERTUSSIS 5-2.5-18.5 LF-MCG/0.5 IM SUSY
0.5000 mL | PREFILLED_SYRINGE | Freq: Once | INTRAMUSCULAR | Status: AC
  Administered 2021-09-07: 0.5 mL via INTRAMUSCULAR
  Filled 2021-09-07: qty 0.5

## 2021-09-07 MED ORDER — LIDOCAINE HCL (PF) 1 % IJ SOLN
INTRAMUSCULAR | Status: AC
  Filled 2021-09-07: qty 5

## 2021-09-07 MED ORDER — CARBIDOPA-LEVODOPA 25-250 MG PO TABS
1.0000 | ORAL_TABLET | Freq: Three times a day (TID) | ORAL | 3 refills | Status: DC
  Filled 2021-09-07: qty 270, 90d supply, fill #0
  Filled 2021-12-07: qty 270, 90d supply, fill #1
  Filled 2022-02-26: qty 270, 90d supply, fill #2
  Filled 2022-06-05: qty 270, 90d supply, fill #3

## 2021-09-07 NOTE — Addendum Note (Signed)
Addended by: Brandon Melnick on: 09/07/2021 02:39 PM ? ? Modules accepted: Orders ? ?

## 2021-09-07 NOTE — Telephone Encounter (Signed)
New prescription sent to pts pharmacy for his sinemet 25-250 1 tablet TID ?

## 2021-09-07 NOTE — Telephone Encounter (Signed)
Patient needs refill for meds a fax was sent Monday from Pharmacy he said they didn't receive. So he came into the office. ?He has medication left for 2 days of the Carbidopa-lovodopa. His next appt October  with [facility]. ? ?Thank you ?

## 2021-09-07 NOTE — ED Provider Notes (Signed)
?[place] EMERGENCY DEPT ?Provider Note ? ? ?CSN: [id number] ?Arrival date & time: 09/06/21  1903 ? ?  ? ?History ? ?Chief Complaint  ?Patient presents with  ? Hand Injury  ? ? ?Todd Rice is a 66 y.o. male. ? ?Patient is a 66 year old male with past medical history of coronary artery disease, hypertension, type 2 diabetes, and Parkinson's disease.  Patient presents today after a fall.  He was walking in a restaurant parking lot when he tripped and fell forward.  He bent his right fifth finger backward and caused a laceration at the base of the finger.  Last tetanus unknown. ? ?The history is provided by the patient.  ? ?  ? ?Home Medications ?Prior to Admission medications   ?Medication Sig Start Date End Date Taking? Authorizing Provider  ?albuterol (VENTOLIN HFA) 108 (90 Base) MCG/ACT inhaler INHALE 2 PUFFS EVERY 4 HOURS (15 MINUTES APART) FOR ASTHMA RESCUE 03/01/20 06/23/21  Unk Pinto, MD  ?allopurinol (ZYLOPRIM) 300 MG tablet TAKE 1 TABLET BY MOUTH ONCE A DAY TO TO PREVENT GOUT 07/12/21 07/12/22  [name], NP  ?ALPRAZolam (XANAX) 0.5 MG tablet Take 1 tab up to three times a day only if needed for severe anxiety or panic attack. Avoid daily use. ?Patient taking differently: as needed. 05/26/21   [name], NP  ?aspirin EC 81 MG tablet Take 81 mg by mouth at bedtime.    [provider]  ?carbidopa-levodopa (SINEMET IR) 25-250 MG tablet TAKE 1 TABLET BY MOUTH 3 TIMES DAILY 08/31/20 09/05/21  Kathrynn Ducking, MD  ?ezetimibe (ZETIA) 10 MG tablet Take  1 tablet  Daily  for Cholesterol 03/28/21   Unk Pinto, MD  ?glipiZIDE (GLUCOTROL) 5 MG tablet Take 1 tablet (5 mg total) by mouth 2 (two) times daily before a meal. 10/07/18 06/23/21  Vladimir Crofts, PA-C  ?Magnesium 250 MG TABS Take 1 tablet by mouth daily.    [provider]  ?metFORMIN (GLUCOPHAGE-XR) 500 MG 24 hr tablet TAKE 2 TABLETS BY MOUTH 2 TIMES DAILY 09/06/21 09/06/22  Magda Bernheim, NP  ?mometasone  (NASONEX) 50 MCG/ACT nasal spray PLACE 2 SPRAYS INTO THE NOSE DAILY. ?Patient taking differently: as needed. 08/02/20 08/02/21  Garnet Sierras, NP  ?polyethylene glycol powder (GLYCOLAX/MIRALAX) 17 GM/SCOOP powder Take 1 Container by mouth daily.    [provider]  ?pramipexole (MIRAPEX) 1 MG tablet Take 1 tablet (1 mg total) by mouth 3 (three) times daily. 08/31/20   Kathrynn Ducking, MD  ?rosuvastatin (CRESTOR) 40 MG tablet Take 1 tablet (40 mg total) by mouth daily. 11/03/20   [name], NP  ?Semaglutide,0.25 or 0.'5MG'$ /DOS, (OZEMPIC, 0.25 OR 0.5 MG/DOSE,) 2 MG/1.5ML SOPN Start by injecting 0.25 mg into skin of stomach once weekly; if doing well increase to 0.5 mg in 4 weeks. 06/23/21   [name], NP  ?senna (SENOKOT) 8.6 MG tablet Take 1 tablet by mouth daily.    [provider]  ?valsartan-hydrochlorothiazide (DIOVAN-HCT) 320-25 MG tablet TAKE 1 TABLET BY MOUTH ONCE A DAY FOR BLOOD PRESSURE 05/25/21 05/25/22  Magda Bernheim, NP  ?vitamin C (ASCORBIC ACID) 500 MG tablet Take 1 tablet (500 mg total) by mouth 3 (three) times daily. 01/16/17   [name], NP  ?VITAMIN D PO Take 5,000 Units by mouth 2 (two) times daily.    [provider]  ?zolpidem (AMBIEN) 5 MG tablet Take 1 tablet (5 mg total) by mouth at bedtime as needed for sleep. 07/30/19   Dohmeier,  Asencion Partridge, MD  ?   ? ?Allergies    ?Ciprofloxacin and Ceftriaxone   ? ?Review of Systems   ?Review of Systems  ?All other systems reviewed and are negative. ? ?Physical Exam ?Updated Vital Signs ?BP (!) 158/94 (BP Location: Left Arm)   Pulse 78   Temp 98.7 ?F (37.1 ?C)   Resp 16   Ht '5\' 7"'$  (1.702 m)   Wt 119.9 kg   SpO2 97%   BMI 41.40 kg/m?  ?Physical Exam ?Vitals and nursing note reviewed.  ?Constitutional:   ?   Appearance: Normal appearance.  ?HENT:  ?   Head: Normocephalic and atraumatic.  ?Pulmonary:  ?   Effort: Pulmonary effort is normal.  ?Musculoskeletal:  ?   Comments: The right fifth finger has a laceration on  the flexor surface at the level of the MCP joint.  Bleeding is controlled.  ?Skin: ?   General: Skin is warm and dry.  ?Neurological:  ?   Mental Status: He is alert and oriented to person, place, and time.  ? ? ?ED Results / Procedures / Treatments   ?Labs ?(all labs ordered are listed, but only abnormal results are displayed) ?Labs Reviewed - No data to display ? ?EKG ?None ? ?Radiology ?DG Hand Complete Right ? ?Result Date: 09/06/2021 ?CLINICAL DATA:  Right hand pain and swelling to posterior hand from fall. Laceration to proximal fifth digit. EXAM: RIGHT HAND - COMPLETE 3+ VIEW COMPARISON:  None Available. FINDINGS: Mild-to-moderate joint space narrowing of the interphalangeal joints diffusely. Tiny degenerative ossicle at the lateral aspect of the thumb interphalangeal joint. Moderate to severe thumb carpometacarpal and moderate triscaphe joint space narrowing. Mild chronic ossicle at the lateral aspect of the thumb carpometacarpal joint. No acute fracture or dislocation. There are 3 oval calcific densities along the dorsal medial aspect of the forearm extending to the distal aspect of the ulna, possibly vascular phleboliths. IMPRESSION: No acute fracture.  Osteoarthritis as above. Electronically Signed   By: Yvonne Kendall M.D.   On: 09/06/2021 20:27   ? ?Procedures ?Procedures  ? ? ?Medications Ordered in ED ?Medications  ?lidocaine (PF) (XYLOCAINE) 1 % injection (has no administration in time range)  ?Tdap (BOOSTRIX) injection 0.5 mL (has no administration in time range)  ? ? ?ED Course/ Medical Decision Making/ A&P ? ?X-rays negative for fracture.  Laceration repaired as below.  Patient to be discharged with local wound care, suture removal in 1 week, and return as needed. ? ?LACERATION REPAIR ?Performed by: Veryl Speak ?Authorized by: Veryl Speak ?Consent: Verbal consent obtained. ?Risks and benefits: risks, benefits and alternatives were discussed ?Consent given by: patient ?Patient identity confirmed:  provided demographic data ?Prepped and Draped in normal sterile fashion ?Wound explored ? ?Laceration Location: Right fifth finger ? ?Laceration Length: 1.5 cm ? ?No Foreign Bodies seen or palpated ? ?Anesthesia: local infiltration ? ?Local anesthetic: lidocaine 1% without epinephrine ? ?Anesthetic total: 5 ml ? ?Irrigation method: syringe ?Amount of cleaning: standard ? ?Skin closure: 4-0 Prolene ? ?Number of sutures: 3 ? ?Technique: Simple interrupted ? ?Patient tolerance: Patient tolerated the procedure well with no immediate complications. ? ? ?Final Clinical Impression(s) / ED Diagnoses ?Final diagnoses:  ?None  ? ? ?Rx / DC Orders ?ED Discharge Orders   ? ? None  ? ?  ? ? ?  ?Veryl Speak, MD ?09/07/21 0107 ? ?

## 2021-09-07 NOTE — Discharge Instructions (Signed)
Local wound care with bacitracin and dressing changes twice daily. ? ?Sutures are to be removed in 1 week.  Please follow-up with your primary doctor for this. ? ?Return to the emergency department if you develop increased redness, pus draining from the wound, streaks up or down the arm, or other new and concerning symptoms. ?

## 2021-09-08 ENCOUNTER — Ambulatory Visit: Payer: Medicare Other | Admitting: Occupational Therapy

## 2021-09-08 DIAGNOSIS — G4733 Obstructive sleep apnea (adult) (pediatric): Secondary | ICD-10-CM | POA: Diagnosis not present

## 2021-09-09 ENCOUNTER — Ambulatory Visit: Payer: Medicare Other

## 2021-09-09 ENCOUNTER — Other Ambulatory Visit (HOSPITAL_BASED_OUTPATIENT_CLINIC_OR_DEPARTMENT_OTHER): Payer: Self-pay

## 2021-09-09 DIAGNOSIS — R2689 Other abnormalities of gait and mobility: Secondary | ICD-10-CM | POA: Diagnosis not present

## 2021-09-09 DIAGNOSIS — R471 Dysarthria and anarthria: Secondary | ICD-10-CM | POA: Diagnosis not present

## 2021-09-09 DIAGNOSIS — R2681 Unsteadiness on feet: Secondary | ICD-10-CM | POA: Diagnosis not present

## 2021-09-09 DIAGNOSIS — R29818 Other symptoms and signs involving the nervous system: Secondary | ICD-10-CM | POA: Diagnosis not present

## 2021-09-09 DIAGNOSIS — R293 Abnormal posture: Secondary | ICD-10-CM | POA: Diagnosis not present

## 2021-09-09 DIAGNOSIS — R29898 Other symptoms and signs involving the musculoskeletal system: Secondary | ICD-10-CM | POA: Diagnosis not present

## 2021-09-09 DIAGNOSIS — R41841 Cognitive communication deficit: Secondary | ICD-10-CM

## 2021-09-09 DIAGNOSIS — R251 Tremor, unspecified: Secondary | ICD-10-CM | POA: Diagnosis not present

## 2021-09-09 DIAGNOSIS — R278 Other lack of coordination: Secondary | ICD-10-CM | POA: Diagnosis not present

## 2021-09-09 DIAGNOSIS — R1310 Dysphagia, unspecified: Secondary | ICD-10-CM

## 2021-09-09 NOTE — Patient Instructions (Signed)
?  YOU HAVE TO CONTINUE DOING THE "AH"S AND EVERYDAY SENTENCES.  ?...EVERY DAY! ?

## 2021-09-09 NOTE — Therapy (Signed)
[facility] ?[address], [address] ?[place], [place], [address] ?Phone: [phone]   Fax:  [phone] ? ?Speech Language Pathology Treatment/discharge summary ? ?Patient Details  ?Name: Todd Rice ?MRN: [id number] ?Date of Birth: May 04, 1956 ?Referring Provider (SLP): Seymour Bars, NP ? ? ?Encounter Date: 09/09/2021 ? ? End of Session - 09/09/21 1329   ? ? Visit Number 11   ? Number of Visits 17   ? Date for SLP Re-Evaluation 09/19/21   ? SLP Start Time [phone]   ? SLP Stop Time  0930   ? SLP Time Calculation (min) 41 min   ? Activity Tolerance Patient tolerated treatment well   ? ?  ?  ? ?  ? ? ?Past Medical History:  ?Diagnosis Date  ? Adult BMI 50.0-59.9 kg/sq m   ? 52.77  ? Anal fissure   ? Anxiety   ? Diabetes mellitus without complication ([place])   ? borderline  ? Elevated PSA 08/13/2019  ? GERD (gastroesophageal reflux disease)   ? 30 years ago, maybe from peptic ulcer  ? H/O: gout   ? STABLE  ? Hepatic steatosis 10/01/11  ? History of kidney stones   ? Hyperlipidemia   ? Hypertension   ? Kidney tumor   ? right upper kidney tumor, monitoring  ? OSA on CPAP   ? [employer] [facility]  ? Parkinson disease ([place]) 01/04/2016  ? REM sleep behavior disorder 12/06/2016  ? Right ureteral stone   ? Ureteral calculus, right 06/12/2011  ? Vitamin D deficiency   ? Weakness   ? ? ?Past Surgical History:  ?Procedure Laterality Date  ? ANAL FISSURE REPAIR  10-12-2000  ? COLONOSCOPY N/A 07/01/2012  ? Procedure: COLONOSCOPY;  Surgeon: Lafayette Dragon, MD;  Location: [facility];  Service: Endoscopy;  Laterality: N/A;  ? CYSTOSCOPY WITH RETROGRADE PYELOGRAM, URETEROSCOPY AND STENT PLACEMENT Bilateral 02/26/2013  ? Procedure: CYSTOSCOPY WITH RETROGRADE PYELOGRAM, URETEROSCOPY AND STENT PLACEMENT;  Surgeon: Alexis Frock, MD;  Location: [facility];  Service: Urology;  Laterality: Bilateral;  ? HOLMIUM LASER APPLICATION Bilateral 04/59/9774  ? Procedure: HOLMIUM LASER APPLICATION;  Surgeon: Alexis Frock, MD;   Location: [facility];  Service: Urology;  Laterality: Bilateral;  ? kidney stone removal    ? 06/2011-stent placement  ? LEFT MEDIAL FEMORAL CONDYLE DEBRIDEMENT & DRILLING/ REMOVAL LOOSE BODY  09-15-2003  ? NASAL SEPTUM SURGERY  1985  ? STONE EXTRACTION WITH BASKET  06/12/2011  ? Procedure: STONE EXTRACTION WITH BASKET;  Surgeon: Claybon Jabs, MD;  Location[facility];  Service: Urology;  Laterality: Right;  ? ? ?There were no vitals filed for this visit. ? ?SPEECH THERAPY DISCHARGE SUMMARY ? ?Visits from Start of Care: 11 ? ?Current functional level related to goals / functional outcomes: ?See below. Wife and pt agree pt is performing much better with speech intelligibility than prior to ST.  ?  ?Remaining deficits: ?Mild intermittent decr in volume especially when fatigued.  ?  ?Education / Equipment: ?Frequency of loud /a/ and sentences.   ? ?Patient agrees to discharge. Patient goals were partially met. Patient is being discharged due to being pleased with the current functional level.. ? ? ? ? ? Subjective Assessment - 09/09/21 0914   ? ? Subjective "He's really been doing better." (wife)   ? Currently in Pain? No/denies   ? ?  ?  ? ?  ? ? ? ? ? ? ? ? ADULT SLP TREATMENT - 09/09/21 0915   ? ?  ?  General Information  ? Behavior/Cognition Alert;Cooperative;Pleasant mood   ?  ? Treatment Provided  ? Treatment provided Cognitive-Linquistic   ?  ? Cognitive-Linquistic Treatment  ? Treatment focused on Dysarthria   ? Skilled Treatment Todd Rice told SLP he feels his speech is more fluent and he is able to stay on track with train of thought compared to initial eval. SLP used /a/ to habitualize louder conversational volume - average mid-upper 90s dB, and everyday sentences read in mid 80s dB average. Mod complex/complex conversation of 25 minutes today - pt maintained WNL volume despite decr'ing for 10-15 seconds x3 but then increasing back into WNL range. PROM was scored 26/30 today. Abdominal breathing 75% of  the time today. Pt is happy with d/c today.   ?  ? Assessment / Recommendations / Plan  ? Plan Discharge SLP treatment due to (comment)   ?  ? Progression Toward Goals  ? Progression toward goals --   d/c day- see goals  ? ?  ?  ? ?  ? ? ? ? ? SLP Short Term Goals - 08/15/21 1234   ? ?  ? SLP SHORT TERM GOAL #1  ? Title pt will maintain loud /a/ average mid 90s dB with appropriate voicing over 4 sessions   ? Baseline 08-02-21, 08-05-21, 08-08-21   ? Status Achieved   ?  ? SLP SHORT TERM GOAL #2  ? Title pt will produce 20/20 sentences with average 70dB over two sessions   ? Baseline 08-02-21   ? Status Achieved   ?  ? SLP SHORT TERM GOAL #3  ? Title pt will demo simple conversation of 10 minutes with average 70dB over two sessions   ? Baseline 08-05-21   ? Status Achieved   ?  ? SLP SHORT TERM GOAL #4  ? Title pt will undergo cognitive linguistic evaluation and/or instrumental/objective swallow assessment PRN   ? Status Deferred   ? ?  ?  ? ?  ? ? ? SLP Long Term Goals - 09/09/21 1331   ? ?  ? SLP LONG TERM GOAL #1  ? Title pt will maintain loud /a/ at mid 90s dB with proper voicing over 6 sessions   ? Baseline 08/05/21, 08-08-21, 08/10/21, 08-24-21   ? Status Partially Met   ?  ? SLP LONG TERM GOAL #2  ? Title pt will demo 15 minutes mod complex conversastion with average low 70sdB given rare nonverbal cues over three sessions   ? Baseline 08-15-21, 08-24-21   ? Status Achieved   ?  ? SLP LONG TERM GOAL #3  ? Title in the last 1-2 visits pt will verify less-frequent "losing my train of thought" compared to prior to [place]   ? Status Achieved   ?  ? SLP LONG TERM GOAL #4  ? Title pt will use abdominal breathing 80% of the time in 15 minutes mod complex conversation in 3 sessions   ? Baseline 08-24-21   ? Status Partially Met   ?  ? SLP LONG TERM GOAL #5  ? Title pt will score higher than his initial score on a communication PROM in last 1-2 sessions   ? Baseline 24/30   ? Status Achieved   ? ?  ?  ? ?  ? ? ? Plan - 09/09/21 1330   ? ?  Clinical Impression Statement Todd "[name]" presents today with cont'd mild hypokinetic dysarthria due to parkinson's disease (PD). See "skilled intervention" for more details of  today's session. Pt cont to report WNL eating/drinking, denying overt s/s aspiration during meals, Pt agrees to d/c today   ? Treatment/Interventions Environmental controls;Compensatory techniques;Functional tasks;Multimodal communcation approach;Cueing hierarchy;Language facilitation;Cognitive reorganization;Internal/external aids;Patient/family education;SLP instruction and feedback;Aspiration precaution training;Pharyngeal strengthening exercises;Oral motor exercises   ? Potential to Achieve Goals Good   ? SLP Home Exercise Plan provided   ? Consulted and Agree with Plan of Care Patient   ? ?  ?  ? ?  ? ? ?Patient will benefit from skilled therapeutic intervention in order to improve the following deficits and impairments:   ?Dysarthria and anarthria ? ?Dysphagia, unspecified type ? ?Cognitive communication deficit ? ? ? ?Problem List ?Patient Active Problem List  ? Diagnosis Date Noted  ? Constipation 03/24/2021  ? Gout 03/23/2021  ? Primary renal papillary carcinoma, right ([place]) 02/18/2020  ? Hepatic steatosis 02/18/2020  ? Major depression in remission ([place]) 11/11/2019  ? Central sleep apnea due to Cheyne-Stokes respiration 08/31/2019  ? Hyperlipidemia associated with type 2 diabetes mellitus ([place]) 07/09/2019  ? Aortic atherosclerosis ([place]) by CXR on 06/10/2018 06/10/2018  ? Type 2 diabetes mellitus with stage 3a chronic kidney disease, without long-term current use of insulin ([place]) 01/21/2018  ? OSA treated with BiPAP 01/03/2017  ? REM sleep behavior disorder 12/06/2016  ? CKD stage 3 due to type 2 diabetes mellitus ([place]) 10/23/2016  ? Medication management 10/23/2016  ? Vitamin D deficiency 10/23/2016  ? Parkinson's disease ([place]) 01/04/2016  ? Seasonal and perennial allergic rhinitis 06/21/2014  ? Asthma, mild intermittent,  well-controlled 06/21/2014  ? Pseudomonas aeruginosa colonization 10/08/2012  ? CAD (coronary artery disease) 11/15/2011  ? Essential hypertension 11/15/2011  ? Morbid obesity with BMI of 40.0-44.9, adult ([facility]

## 2021-09-12 NOTE — Therapy (Signed)
OUTPATIENT OCCUPATIONAL THERAPY TREATMENT NOTE   Patient Name: Todd Rice MRN: [id number] DOB:1956/04/04, 66 y.o., male Today's Date: 09/15/2021  PCP: Lucky Cowboy, MD  REFERRING PROVIDER: Renelda Loma, NP   END OF SESSION:   OT End of Session - 09/15/21 0844     Visit Number 3    Number of Visits 17    Date for OT Re-Evaluation 11/28/21    Authorization Type U[employer] Medicare, follows Medicare guidelines    Authorization - Number of Visits 3    Progress Note Due on Visit 10    OT Start Time 0845    OT Stop Time 0930    OT Time Calculation (min) 45 min    Activity Tolerance Patient tolerated treatment well    Behavior During Therapy WFL for tasks assessed/performed              Past Medical History:  Diagnosis Date   Adult BMI 50.0-59.9 kg/sq m    52.77   Anal fissure    Anxiety    Diabetes mellitus without complication ([facility])    borderline   Elevated PSA 08/13/2019   GERD (gastroesophageal reflux disease)    30 years ago, maybe from peptic ulcer   H/O: gout    STABLE   Hepatic steatosis 10/01/11   History of kidney stones    Hyperlipidemia    Hypertension    Kidney tumor    right upper kidney tumor, monitoring   OSA on CPAP    [employer] [facility]   Parkinson disease ([facility]) 01/04/2016   REM sleep behavior disorder 12/06/2016   Right ureteral stone    Ureteral calculus, right 06/12/2011   Vitamin D deficiency    Weakness    Past Surgical History:  Procedure Laterality Date   ANAL FISSURE REPAIR  10-12-2000   COLONOSCOPY N/A 07/01/2012   Procedure: COLONOSCOPY;  Surgeon: Hart Carwin, MD;  [facility];  Service: Endoscopy;  Laterality: N/A;   CYSTOSCOPY WITH RETROGRADE PYELOGRAM, URETEROSCOPY AND STENT PLACEMENT Bilateral 02/26/2013   Procedure: CYSTOSCOPY WITH RETROGRADE PYELOGRAM, URETEROSCOPY AND STENT PLACEMENT;  Surgeon: Sebastian Ache, MD;  Location: [facility];  Service: Urology;  Laterality: Bilateral;   HOLMIUM LASER APPLICATION Bilateral  02/26/2013   Procedure: HOLMIUM LASER APPLICATION;  Surgeon: Sebastian Ache, MD;  Location: [facility];  Service: Urology;  Laterality: Bilateral;   kidney stone removal     06/2011-stent placement   LEFT MEDIAL FEMORAL CONDYLE DEBRIDEMENT & DRILLING/ REMOVAL LOOSE BODY  09-15-2003   NASAL SEPTUM SURGERY  1985   STONE EXTRACTION WITH BASKET  06/12/2011   Procedure: STONE EXTRACTION WITH BASKET;  Surgeon: Garnett Farm, MD;  Location: [facility];  Service: Urology;  Laterality: Right;   Patient Active Problem List   Diagnosis Date Noted   Constipation 03/24/2021   Gout 03/23/2021   Primary renal papillary carcinoma, 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   Hepatic steatosis 02/18/2020   Major depression in remission ([facility]) 11/11/2019   Central sleep apnea due to Cheyne-Stokes respiration 08/31/2019   Hyperlipidemia associated with type 2 diabetes mellitus ([facility]) 07/09/2019   Aortic atherosclerosis ([facility]) by CXR on 06/10/2018 06/10/2018   Type 2 diabetes mellitus with stage 3a chronic kidney disease, without long-term current use of insulin ([facility]) 01/21/2018   OSA treated with BiPAP 01/03/2017   REM sleep behavior disorder 12/06/2016   CKD stage 3 due to type 2 diabetes mellitus ([facility]) 10/23/2016   Medication management 10/23/2016   Vitamin D deficiency 10/23/2016   Parkinson's  disease ([facility]) 01/04/2016   Seasonal and perennial allergic rhinitis 06/21/2014   Asthma, mild intermittent, well-controlled 06/21/2014   Pseudomonas aeruginosa colonization 10/08/2012   CAD (coronary artery disease) 11/15/2011   Essential hypertension 11/15/2011   Morbid obesity with BMI of 40.0-44.9, adult ([facility]) 11/15/2011    ONSET DATE: 08/25/21 (referral date)   REFERRING DIAG: Parkinson's Disease   THERAPY DIAG:  Other lack of coordination  Other symptoms and signs involving the nervous system  Other symptoms and signs involving the musculoskeletal system  Abnormal posture  Tremor  Unsteadiness on  feet  Other abnormalities of gait and mobility   PERTINENT HISTORY: Parkinson's Disease, CAD, HTN, HDL, obesity, asthma, DM, CKD, vitamin D deficiency, REM sleep disorder, OSA, aortic atherosclerosis, hx of gout, GERD, hx of anxiety   PRECAUTIONS: None   SUBJECTIVE: Pt fell in restaurant parking lot last Tues and injured R hand (laceration/bruise with stitches at base of R 5th digit).  Pt got stitches removed yesterday.  Pt denies pain.    PAIN:  Are you having pain? No     OBJECTIVE:   TODAY'S TREATMENT:   Note:  movement to pt tolerance with R hand/5th digit.  Pt with wound at base of 5th digit of R hand, but appears to be healing well.  Pt also with bruise on 5th digit and fading one on 4th digit.    Flipping cards for Bilateral coordination (both hands simultaneously)  with min v.c. and for timing/large amplitude.  With both hands (cueing for large amplitude):  with palm on table, individual finger lifts with abduction/adduction, then isolated/individual finger lifts for incr MP ext. With min v.c. for incr movement amplitude.  Isolated IP flexion/ext for decr rigidity of hands with min v.c.  Thumb circles with R hand with targets for large amplitude movements (both directions)  Forearm gym with each UE for wrist ROM and coordination with min difficulty/cues for large amplitude.   Standing, tossing beanbags with each UE incorporating step foreward/back, trunk rotation with min cueing for large amplitude.  Picking up coins with each hand with each finger/thumb to stack with min cueing for large amplitude as able and min difficulty.      PATIENT EDUCATION: Education details: n/a today Person educated:  Education method:  Education comprehension:      HOME EXERCISE PROGRAM: Not yet issued updated HEP.  CLINICAL IMPRESSION:  Pt is progressing towards goals, but healing R hand injury limits activity somewhat today.   PERFORMANCE DEFICITS in functional skills including  ADLs, IADLs, coordination, dexterity, tone, ROM, FMC, GMC, balance, and UE functional use, cognitive skills including  n/a , and psychosocial skills including habits and routines and behaviors.    IMPAIRMENTS are limiting patient from ADLs, IADLs, and leisure.    COMORBIDITIES may have co-morbidities  that affects occupational performance. Patient will benefit from skilled OT to address above impairments and improve overall function.   MODIFICATION OR ASSISTANCE TO COMPLETE EVALUATION: Min-Moderate modification of tasks or assist with assess necessary to complete an evaluation.   OT OCCUPATIONAL PROFILE AND HISTORY: Detailed assessment: Review of records and additional review of physical, cognitive, psychosocial history related to current functional performance.   CLINICAL DECISION MAKING: Moderate - several treatment options, min-mod task modification necessary   REHAB POTENTIAL: Good   EVALUATION COMPLEXITY: Moderate       GOALS: Potential Goals reviewed with patient? Yes    SHORT TERM GOALS: Target date: 09/27/2021   Pt will be independent with updated PD-specific HEP. Baseline:  Goal status: INITIAL   2.  Pt will improve bilateral coordination to be able to fasten/unfasten 3 buttons in 34sec or less. Baseline:  50, 38.06sec Goal status: INITIAL   3.  Pt will be able to write at least 3 sentences with only min decr in size.   Goal status: INITIAL   4.  Pt will improve R hand coordination for ADLs/IADLs as shown by completing 9-hole peg test in 49sec or less. Baseline:  53.94sec Goal status: INITIAL     LONG TERM GOALS: Target date: 10/25/2021   Pt will verbalize understanding of strategies for ADLs/IADLs to incr ease, incr safety and decr risk of future complications (including strategies for eating, writing, shaving, reaching).   Goal status: INITIAL   2.   Pt will improve coordination/functional reaching with RUE as shown by improving score on box and blocks test by at  least 3. Baseline:  43 blocks Goal status: INITIAL   3.  Pt will improve L hand coordination for ADLs/IADLs as shown by completing 9-hole peg test in 39sec or less. Baseline: 43sec Goal status: INITIAL   4.  Pt will improve R hand coordination for ADLs/IADLs as shown by completing 9-hole peg test in 45sec or less. Baseline:  53.94sec Goal status: INITIAL   5.  Pt will improve coordination/functional reaching with LUE as shown by improving score on box and blocks test by at least 3. Baseline:  42 blocks Goal status: INITIAL     PLAN: OT FREQUENCY: 2x/week   OT DURATION: 8 weeks or 16 visits over 12 weeks due to scheduling +eval   PLANNED INTERVENTIONS: self care/ADL training, therapeutic exercise, therapeutic activity, neuromuscular re-education, manual therapy, manual lymph drainage, passive range of motion, balance training, functional mobility training, aquatic therapy, splinting, electrical stimulation, ultrasound, paraffin, fluidotherapy, moist heat, cryotherapy, patient/family education, energy conservation, coping strategies training, and DME and/or AE instructions   RECOMMENDED OTHER SERVICES: current with ST, just completed PT at [facility]   CONSULTED AND AGREED WITH PLAN OF CARE: Patient   PLAN FOR NEXT SESSION:  (monitor R hand/5th digit for pain due to recent injury), functional reaching, coordination, large amplitude movements, bilateral hand coordination, UE stretching       Becky Colan, OT 09/15/2021, 9:43 AM  Willa Frater, OTR/L [facility] [address] [place], [place]  [address] [phone] phone [phone] 09/15/21 9:43 AM

## 2021-09-13 ENCOUNTER — Ambulatory Visit: Payer: Medicare Other | Admitting: Occupational Therapy

## 2021-09-14 ENCOUNTER — Encounter: Payer: Self-pay | Admitting: Nurse Practitioner

## 2021-09-14 ENCOUNTER — Ambulatory Visit (INDEPENDENT_AMBULATORY_CARE_PROVIDER_SITE_OTHER): Payer: Medicare Other | Admitting: Nurse Practitioner

## 2021-09-14 VITALS — BP 122/64 | HR 75 | Temp 97.5°F | Wt 261.6 lb

## 2021-09-14 DIAGNOSIS — S66821D Laceration of other specified muscles, fascia and tendons at wrist and hand level, right hand, subsequent encounter: Secondary | ICD-10-CM

## 2021-09-14 DIAGNOSIS — G20 Parkinson's disease: Secondary | ICD-10-CM | POA: Diagnosis not present

## 2021-09-14 DIAGNOSIS — S61401D Unspecified open wound of right hand, subsequent encounter: Secondary | ICD-10-CM

## 2021-09-14 NOTE — Progress Notes (Signed)
Assessment and Plan: ?Olan was seen today for follow-up. ? ?Diagnoses and all orders for this visit: ? ?Parkinson's disease ([place]) ?Continue medication, therapy and follow with Dr. Brett Fairy ? ?Extensor tendon laceration of right hand with open wound, subsequent encounter ?Sutures removed, well approximated without drainage ?Monitor for s/s of infection, continue to keep clean and dry. ?Keep scheduled f/u appointment with Caryl Pina in 1 week ?  ? ? ? ?Further disposition pending results of labs. Discussed med's effects and SE's.   ?Over 30 minutes of exam, counseling, chart review, and critical decision making was performed.  ? ?Future Appointments  ?Date Time Provider [place]  ?09/15/2021  8:45 AM Delton Prairie, OT [facility]-NR OPRCNR  ?09/20/2021  7:15 AM Delton Prairie, OT [facility]NR OPRCNR  ?09/20/2021  4:00 PM [name], NP GAAM-GAAIM None  ?09/22/2021  7:15 AM Delton Prairie, OT [facility]PR[facility]-NR OPRCNR  ?09/27/2021  7:15 AM Jairo Ben, Selmer Dominion, OT [facility]PRC-NR OPRCNR  ?09/29/2021  7:15 AM Delton Prairie, OT [facility]-NR OPRCNR  ?10/04/2021  7:15 AM Delton Prairie, OT [facility]-NR OPRCNR  ?10/06/2021  7:15 AM Delton Prairie, OT [facility]-NR OPRCNR  ?10/11/2021  9:30 AM Delton Prairie, OT [facility]-NR OPRCNR  ?10/13/2021  7:15 AM Delton Prairie, OT [facility]-NR OPRCNR  ?10/18/2021  7:15 AM Delton Prairie, OT [facility]-NR OPRCNR  ?10/20/2021  7:15 AM Delton Prairie, OT [facility]-NR OPRCNR  ?10/25/2021  7:15 AM Rine, Selmer Dominion, OT [facility]C-NR OPRCNR  ?10/27/2021  8:00 AM Rine, Selmer Dominion, OT [facility]-NR OPRCNR  ?12/06/2021  3:00 PM Carleene Mains, RPH GAAM-GAAIM None  ?02/08/2022  8:30 AM Ward Givens, NP GNA-GNA None  ?03/28/2022  3:00 PM [name], NP GAAM[facility]GAAIM None  ?04/20/2022 11:45 AM Sharen Counter, CCC-SLP OPRC-BF OPRCBF  ?04/20/2022 12:15 PM Frazier Butt, PT O[facility]BF OPRCBF  ? ? ?------------------------------------------------------------------------------------------------------------------ ? ? ?HPI ?BP 122/64   Pulse 75   Temp (!)  97.5 ?F (36.4 ?C)   Wt 261 lb 9.6 oz (118.7 kg)   SpO2 97%   BMI 40.97 kg/m?  ? ?66 y.o.male presents for suture removal of right hand laceration.  Area is well approximated with no S/S of infection.  Denies pain in area.  ? ?Past Medical History:  ?Diagnosis Date  ? Adult BMI 50.0-59.9 kg/sq m   ? 52.77  ? Anal fissure   ? Anxiety   ? Diabetes mellitus without complication ([place])   ? borderline  ? Elevated PSA 08/13/2019  ? GERD (gastroesophageal reflux disease)   ? 30 years ago, maybe from peptic ulcer  ? H/O: gout   ? STABLE  ? Hepatic steatosis 10/01/11  ? History of kidney stones   ? Hyperlipidemia   ? Hypertension   ? Kidney tumor   ? right upper kidney tumor, monitoring  ? OSA on CPAP   ? [facility]  ? Parkinson disease ([place]) 01/04/2016  ? REM sleep behavior disorder 12/06/2016  ? Right ureteral stone   ? Ureteral calculus, right 06/12/2011  ? Vitamin D deficiency   ? Weakness   ?  ? ?Allergies  ?Allergen Reactions  ? Ciprofloxacin Hives  ? Ceftriaxone Hives  ? ? ?Current Outpatient Medications on File Prior to Visit  ?Medication Sig  ? allopurinol (ZYLOPRIM) 300 MG tablet TAKE 1 TABLET BY MOUTH ONCE A DAY TO TO PREVENT GOUT  ? ALPRAZolam (XANAX) 0.5 MG tablet Take 1 tab up to three times a day only if needed for severe anxiety or panic attack. Avoid  daily use. (Patient taking differently: as needed.)  ? aspirin EC 81 MG tablet Take 81 mg by mouth at bedtime.  ? carbidopa-levodopa (SINEMET IR) 25-250 MG tablet TAKE 1 TABLET BY MOUTH 3 TIMES DAILY  ? ezetimibe (ZETIA) 10 MG tablet Take  1 tablet  Daily  for Cholesterol  ? Magnesium 250 MG TABS Take 1 tablet by mouth daily.  ? metFORMIN (GLUCOPHAGE-XR) 500 MG 24 hr tablet TAKE 2 TABLETS BY MOUTH 2 TIMES DAILY  ? polyethylene glycol powder (GLYCOLAX/MIRALAX) 17 GM/SCOOP powder Take 1 Container by mouth daily.  ? pramipexole (MIRAPEX) 1 MG tablet Take 1 tablet (1 mg total) by mouth 3 (three) times daily.  ? rosuvastatin (CRESTOR) 40 MG tablet Take 1 tablet (40 mg  total) by mouth daily.  ? Semaglutide,0.25 or 0.'5MG'$ /DOS, (OZEMPIC, 0.25 OR 0.5 MG/DOSE,) 2 MG/1.5ML SOPN Start by injecting 0.25 mg into skin of stomach once weekly; if doing well increase to 0.5 mg in 4 weeks.  ? senna (SENOKOT) 8.6 MG tablet Take 1 tablet by mouth daily.  ? valsartan-hydrochlorothiazide (DIOVAN-HCT) 320-25 MG tablet TAKE 1 TABLET BY MOUTH ONCE A DAY FOR BLOOD PRESSURE  ? vitamin C (ASCORBIC ACID) 500 MG tablet Take 1 tablet (500 mg total) by mouth 3 (three) times daily.  ? VITAMIN D PO Take 5,000 Units by mouth 2 (two) times daily.  ? zolpidem (AMBIEN) 5 MG tablet Take 1 tablet (5 mg total) by mouth at bedtime as needed for sleep.  ? albuterol (VENTOLIN HFA) 108 (90 Base) MCG/ACT inhaler INHALE 2 PUFFS EVERY 4 HOURS (15 MINUTES APART) FOR ASTHMA RESCUE  ? glipiZIDE (GLUCOTROL) 5 MG tablet Take 1 tablet (5 mg total) by mouth 2 (two) times daily before a meal.  ? mometasone (NASONEX) 50 MCG/ACT nasal spray PLACE 2 SPRAYS INTO THE NOSE DAILY. (Patient taking differently: as needed.)  ? ?No current facility-administered medications on file prior to visit.  ? ? ?ROS: all negative except above.  ? ?Physical Exam: ? ?BP 122/64   Pulse 75   Temp (!) 97.5 ?F (36.4 ?C)   Wt 261 lb 9.6 oz (118.7 kg)   SpO2 97%   BMI 40.97 kg/m?  ? ?General Appearance: Well nourished, in no apparent distress. ?Eyes: PERRLA, EOMs, conjunctiva no swelling or erythema ?Sinuses: No Frontal/maxillary tenderness ?ENT/Mouth: Ext aud canals clear, TMs without erythema, bulging. No erythema, swelling, or exudate on post pharynx.  Tonsils not swollen or erythematous. Hearing normal.  ?Neck: Supple, thyroid normal.  ?Respiratory: Respiratory effort normal, BS equal bilaterally without rales, rhonchi, wheezing or stridor.  ?Cardio: RRR with no MRGs. Brisk peripheral pulses without edema.  ?Abdomen: Soft, + BS.  Non tender, no guarding, rebound, hernias, masses. ?Lymphatics: Non tender without lymphadenopathy.  ?Musculoskeletal: Full  ROM, 5/5 strength, normal gait.  ?Skin: Warm, dry. 3 sutures removed from right hand base of fifth digit- no s/s of infection, well approximated. ?Neuro: Cranial nerves intact. Normal muscle tone, no cerebellar symptoms. Sensation intact. Tremor of right hand noted ?Psych: Awake and oriented X 3, normal affect, Insight and Judgment appropriate.  ?  ? ?Magda Bernheim, NP ?10:00 AM ?[facility] ? ?

## 2021-09-15 ENCOUNTER — Ambulatory Visit: Payer: Medicare Other | Admitting: Occupational Therapy

## 2021-09-15 DIAGNOSIS — R471 Dysarthria and anarthria: Secondary | ICD-10-CM | POA: Diagnosis not present

## 2021-09-15 DIAGNOSIS — R2681 Unsteadiness on feet: Secondary | ICD-10-CM | POA: Diagnosis not present

## 2021-09-15 DIAGNOSIS — R29898 Other symptoms and signs involving the musculoskeletal system: Secondary | ICD-10-CM | POA: Diagnosis not present

## 2021-09-15 DIAGNOSIS — R278 Other lack of coordination: Secondary | ICD-10-CM | POA: Diagnosis not present

## 2021-09-15 DIAGNOSIS — R251 Tremor, unspecified: Secondary | ICD-10-CM

## 2021-09-15 DIAGNOSIS — R293 Abnormal posture: Secondary | ICD-10-CM

## 2021-09-15 DIAGNOSIS — R29818 Other symptoms and signs involving the nervous system: Secondary | ICD-10-CM

## 2021-09-15 DIAGNOSIS — R2689 Other abnormalities of gait and mobility: Secondary | ICD-10-CM

## 2021-09-15 DIAGNOSIS — R1310 Dysphagia, unspecified: Secondary | ICD-10-CM | POA: Diagnosis not present

## 2021-09-19 NOTE — Therapy (Signed)
?OUTPATIENT OCCUPATIONAL THERAPY TREATMENT NOTE ? ? ?Patient Name: Todd Rice ?MRN: [id number] ?DOB:02-Nov-1955, 66 y.o., male ?Today's Date: 09/20/2021 ? ?PCP: Unk Pinto, MD  ?REFERRING PROVIDER: Ernestine Mcmurray, NP  ? ?END OF SESSION:  ? OT End of Session - 09/20/21 7341   ? ? Visit Number 4   ? Number of Visits 17   ? Date for OT Re-Evaluation 11/28/21   ? Authorization Type UHC Medicare, follows Medicare guidelines   ? Authorization - Number of Visits 4   ? Progress Note Due on Visit 10   ? OT Start Time [phone]   ? OT Stop Time 0800   ? OT Time Calculation (min) 41 min   ? Activity Tolerance Patient tolerated treatment well   ? Behavior During Therapy [facility] for tasks assessed/performed   ? ?  ?  ? ?  ? ? ? ? ?Past Medical History:  ?Diagnosis Date  ? Adult BMI 50.0-59.9 kg/sq m   ? 52.77  ? Anal fissure   ? Anxiety   ? Diabetes mellitus without complication ([place])   ? borderline  ? Elevated PSA 08/13/2019  ? GERD (gastroesophageal reflux disease)   ? 30 years ago, maybe from peptic ulcer  ? H/O: gout   ? STABLE  ? Hepatic steatosis 10/01/11  ? History of kidney stones   ? Hyperlipidemia   ? Hypertension   ? Kidney tumor   ? right upper kidney tumor, monitoring  ? OSA on CPAP   ? [facility]  ? Parkinson disease ([place]) 01/04/2016  ? REM sleep behavior disorder 12/06/2016  ? Right ureteral stone   ? Ureteral calculus, right 06/12/2011  ? Vitamin D deficiency   ? Weakness   ? ?Past Surgical History:  ?Procedure Laterality Date  ? ANAL FISSURE REPAIR  10-12-2000  ? COLONOSCOPY N/A 07/01/2012  ? Procedure: COLONOSCOPY;  Surgeon: Lafayette Dragon, MD;  [facility]: [facility];  Service: Endoscopy;  Laterality: N/A;  ? CYSTOSCOPY WITH RETROGRADE PYELOGRAM, URETEROSCOPY AND STENT PLACEMENT Bilateral 02/26/2013  ? Procedure: CYSTOSCOPY WITH RETROGRADE PYELOGRAM, URETEROSCOPY AND STENT PLACEMENT;  Surgeon: Alexis Frock, MD;  Location: [facility];  Service: Urology;  Laterality: Bilateral;  ? HOLMIUM LASER APPLICATION Bilateral  02/40/9735  ? Procedure: HOLMIUM LASER APPLICATION;  Surgeon: Alexis Frock, MD;  Location: [facility];  Service: Urology;  Laterality: Bilateral;  ? kidney stone removal    ? 06/2011-stent placement  ? LEFT MEDIAL FEMORAL CONDYLE DEBRIDEMENT & DRILLING/ REMOVAL LOOSE BODY  09-15-2003  ? NASAL SEPTUM SURGERY  1985  ? STONE EXTRACTION WITH BASKET  06/12/2011  ? Procedure: STONE EXTRACTION WITH BASKET;  Surgeon: Claybon Jabs, MD;  Location: [facility];  Service: Urology;  Laterality: Right;  ? ?Patient Active Problem List  ? Diagnosis Date Noted  ? Constipation 03/24/2021  ? Gout 03/23/2021  ? Primary renal papillary carcinoma, right ([place]) 02/18/2020  ? Hepatic steatosis 02/18/2020  ? Major depression in remission ([place]) 11/11/2019  ? Central sleep apnea due to Cheyne-Stokes respiration 08/31/2019  ? Hyperlipidemia associated with type 2 diabetes mellitus ([place]) 07/09/2019  ? Aortic atherosclerosis ([place]) by CXR on 06/10/2018 06/10/2018  ? Type 2 diabetes mellitus with stage 3a chronic kidney disease, without long-term current use of insulin ([place]) 01/21/2018  ? OSA treated with BiPAP 01/03/2017  ? REM sleep behavior disorder 12/06/2016  ? CKD stage 3 due to type 2 diabetes mellitus ([place]) 10/23/2016  ? Medication management 10/23/2016  ? Vitamin D deficiency 10/23/2016  ?  Parkinson's disease ([place]) 01/04/2016  ? Seasonal and perennial allergic rhinitis 06/21/2014  ? Asthma, mild intermittent, well-controlled 06/21/2014  ? Pseudomonas aeruginosa colonization 10/08/2012  ? CAD (coronary artery disease) 11/15/2011  ? Essential hypertension 11/15/2011  ? Morbid obesity with BMI of 40.0-44.9, adult ([place]) 11/15/2011  ? ? ?ONSET DATE: 08/25/21 (referral date)  ? ?REFERRING DIAG: Parkinson's Disease  ? ?THERAPY DIAG:  ?Other lack of coordination ? ?Other symptoms and signs involving the nervous system ? ?Other symptoms and signs involving the musculoskeletal system ? ?Abnormal posture ? ?Tremor ? ?Unsteadiness on  feet ? ? ?PERTINENT HISTORY: Parkinson's Disease, CAD, HTN, HDL, obesity, asthma, DM, CKD, vitamin D deficiency, REM sleep disorder, OSA, aortic atherosclerosis, hx of gout, GERD, hx of anxiety  ? ?PRECAUTIONS: None  ? ?SUBJECTIVE: Pt denies pain.    ? ?PAIN:  ?Are you having pain? No ? ? ?OBJECTIVE:  ? ?TODAY'S TREATMENT: ? ? Note:  movement to pt tolerance with R hand/5th digit.  Pt with wound at base of 5th digit of R hand, but appears to be healing well with scab in place.   ? ?PWR! Moves (up, rock, twist) in supine x 10-20 each with min cues For incr movement amplitude, particularly with R hand. ? ?Closed-chain shoulder flex floor>overhead with sit>stand, then diagonals to each side in standing with min cueing for large amplitude movements ? ?Sliding cards across table with use of PWR! hands for Bilateral coordination (both hands simultaneously)  with occasional min v.c. and for large amplitude. ? ?Functional step and reach to deal cards with thumb with each hand to targets with mod difficulty/cueing with R hand and min difficulty/cueing with L hand. ? ?Rotating 2 golf balls in each hand with mod difficulty L hand and max difficulty/min A for R hand, particularly at ulnar side of hand. ? ? ? ?PATIENT EDUCATION: ?Education details: n/a today ?Person educated:  ?Education method:  ?Education comprehension:  ?  ?  ?HOME EXERCISE PROGRAM: ?Not yet issued updated HEP. ? ?CLINICAL IMPRESSION: ? Pt is progressing towards goals and responds well to cueing for large amplitude movements.  Pt does demo significant difficulty with in-hand manipulation tasks with R hand. ?  ?PERFORMANCE DEFICITS in functional skills including ADLs, IADLs, coordination, dexterity, tone, ROM, FMC, GMC, balance, and UE functional use, cognitive skills including  n/a , and psychosocial skills including habits and routines and behaviors.  ?  ?IMPAIRMENTS are limiting patient from ADLs, IADLs, and leisure.  ?  ?COMORBIDITIES may have  co-morbidities  that affects occupational performance. Patient will benefit from skilled OT to address above impairments and improve overall function. ?  ?MODIFICATION OR ASSISTANCE TO COMPLETE EVALUATION: Min-Moderate modification of tasks or assist with assess necessary to complete an evaluation. ?  ?OT OCCUPATIONAL PROFILE AND HISTORY: Detailed assessment: Review of records and additional review of physical, cognitive, psychosocial history related to current functional performance. ?  ?CLINICAL DECISION MAKING: Moderate - several treatment options, min-mod task modification necessary ?  ?REHAB POTENTIAL: Good ?  ?EVALUATION COMPLEXITY: Moderate ?  ?  ?  ?GOALS: ?Potential Goals reviewed with patient? Yes  ?  ?SHORT TERM GOALS: Target date: 09/27/2021 ?  ?Pt will be independent with updated PD-specific HEP. ?Baseline: ?Goal status: INITIAL ?  ?2.  Pt will improve bilateral coordination to be able to fasten/unfasten 3 buttons in 34sec or less. ?Baseline:  50, 38.06sec ?Goal status: INITIAL ?  ?3.  Pt will be able to write at least 3 sentences with only min decr  in size. ?  ?Goal status: INITIAL ?  ?4.  Pt will improve R hand coordination for ADLs/IADLs as shown by completing 9-hole peg test in 49sec or less. ?Baseline:  53.94sec ?Goal status: INITIAL ?  ?  ?LONG TERM GOALS: Target date: 10/25/2021 ?  ?Pt will verbalize understanding of strategies for ADLs/IADLs to incr ease, incr safety and decr risk of future complications (including strategies for eating, writing, shaving, reaching). ?  ?Goal status: INITIAL ?  ?2.   Pt will improve coordination/functional reaching with RUE as shown by improving score on box and blocks test by at least 3. ?Baseline:  43 blocks ?Goal status: INITIAL ?  ?3.  Pt will improve L hand coordination for ADLs/IADLs as shown by completing 9-hole peg test in 39sec or less. ?Baseline: 43sec ?Goal status: INITIAL ?  ?4.  Pt will improve R hand coordination for ADLs/IADLs as shown by completing  9-hole peg test in 45sec or less. ?Baseline:  53.94sec ?Goal status: INITIAL ?  ?5.  Pt will improve coordination/functional reaching with LUE as shown by improving score on box and blocks test by at least 3. ?Baseline:  42

## 2021-09-20 ENCOUNTER — Encounter: Payer: Self-pay | Admitting: Adult Health

## 2021-09-20 ENCOUNTER — Other Ambulatory Visit (HOSPITAL_BASED_OUTPATIENT_CLINIC_OR_DEPARTMENT_OTHER): Payer: Self-pay

## 2021-09-20 ENCOUNTER — Encounter: Payer: Self-pay | Admitting: Occupational Therapy

## 2021-09-20 ENCOUNTER — Ambulatory Visit: Payer: Medicare Other | Admitting: Occupational Therapy

## 2021-09-20 ENCOUNTER — Ambulatory Visit (INDEPENDENT_AMBULATORY_CARE_PROVIDER_SITE_OTHER): Payer: Medicare Other | Admitting: Adult Health

## 2021-09-20 VITALS — BP 110/76 | HR 65 | Temp 97.9°F | Wt 260.0 lb

## 2021-09-20 DIAGNOSIS — Z79899 Other long term (current) drug therapy: Secondary | ICD-10-CM

## 2021-09-20 DIAGNOSIS — I2510 Atherosclerotic heart disease of native coronary artery without angina pectoris: Secondary | ICD-10-CM | POA: Diagnosis not present

## 2021-09-20 DIAGNOSIS — R6889 Other general symptoms and signs: Secondary | ICD-10-CM

## 2021-09-20 DIAGNOSIS — G20 Parkinson's disease: Secondary | ICD-10-CM | POA: Diagnosis not present

## 2021-09-20 DIAGNOSIS — J3089 Other allergic rhinitis: Secondary | ICD-10-CM | POA: Diagnosis not present

## 2021-09-20 DIAGNOSIS — R2681 Unsteadiness on feet: Secondary | ICD-10-CM | POA: Diagnosis not present

## 2021-09-20 DIAGNOSIS — R1310 Dysphagia, unspecified: Secondary | ICD-10-CM | POA: Diagnosis not present

## 2021-09-20 DIAGNOSIS — J302 Other seasonal allergic rhinitis: Secondary | ICD-10-CM

## 2021-09-20 DIAGNOSIS — I10 Essential (primary) hypertension: Secondary | ICD-10-CM | POA: Diagnosis not present

## 2021-09-20 DIAGNOSIS — R278 Other lack of coordination: Secondary | ICD-10-CM | POA: Diagnosis not present

## 2021-09-20 DIAGNOSIS — F325 Major depressive disorder, single episode, in full remission: Secondary | ICD-10-CM

## 2021-09-20 DIAGNOSIS — Z2239 Carrier of other specified bacterial diseases: Secondary | ICD-10-CM

## 2021-09-20 DIAGNOSIS — E1169 Type 2 diabetes mellitus with other specified complication: Secondary | ICD-10-CM | POA: Diagnosis not present

## 2021-09-20 DIAGNOSIS — R251 Tremor, unspecified: Secondary | ICD-10-CM | POA: Diagnosis not present

## 2021-09-20 DIAGNOSIS — K760 Fatty (change of) liver, not elsewhere classified: Secondary | ICD-10-CM

## 2021-09-20 DIAGNOSIS — E785 Hyperlipidemia, unspecified: Secondary | ICD-10-CM

## 2021-09-20 DIAGNOSIS — M1A9XX0 Chronic gout, unspecified, without tophus (tophi): Secondary | ICD-10-CM

## 2021-09-20 DIAGNOSIS — N1831 Chronic kidney disease, stage 3a: Secondary | ICD-10-CM

## 2021-09-20 DIAGNOSIS — C641 Malignant neoplasm of right kidney, except renal pelvis: Secondary | ICD-10-CM

## 2021-09-20 DIAGNOSIS — E1122 Type 2 diabetes mellitus with diabetic chronic kidney disease: Secondary | ICD-10-CM

## 2021-09-20 DIAGNOSIS — K5900 Constipation, unspecified: Secondary | ICD-10-CM

## 2021-09-20 DIAGNOSIS — E559 Vitamin D deficiency, unspecified: Secondary | ICD-10-CM

## 2021-09-20 DIAGNOSIS — R29898 Other symptoms and signs involving the musculoskeletal system: Secondary | ICD-10-CM

## 2021-09-20 DIAGNOSIS — G4733 Obstructive sleep apnea (adult) (pediatric): Secondary | ICD-10-CM

## 2021-09-20 DIAGNOSIS — R972 Elevated prostate specific antigen [PSA]: Secondary | ICD-10-CM

## 2021-09-20 DIAGNOSIS — J4520 Mild intermittent asthma, uncomplicated: Secondary | ICD-10-CM

## 2021-09-20 DIAGNOSIS — R293 Abnormal posture: Secondary | ICD-10-CM | POA: Diagnosis not present

## 2021-09-20 DIAGNOSIS — R29818 Other symptoms and signs involving the nervous system: Secondary | ICD-10-CM | POA: Diagnosis not present

## 2021-09-20 DIAGNOSIS — G20A1 Parkinson's disease without dyskinesia, without mention of fluctuations: Secondary | ICD-10-CM

## 2021-09-20 DIAGNOSIS — Z0000 Encounter for general adult medical examination without abnormal findings: Secondary | ICD-10-CM

## 2021-09-20 DIAGNOSIS — R471 Dysarthria and anarthria: Secondary | ICD-10-CM | POA: Diagnosis not present

## 2021-09-20 DIAGNOSIS — I700 Atherosclerosis of aorta: Secondary | ICD-10-CM

## 2021-09-20 DIAGNOSIS — R2689 Other abnormalities of gait and mobility: Secondary | ICD-10-CM | POA: Diagnosis not present

## 2021-09-20 DIAGNOSIS — Z0001 Encounter for general adult medical examination with abnormal findings: Secondary | ICD-10-CM | POA: Diagnosis not present

## 2021-09-20 DIAGNOSIS — N183 Chronic kidney disease, stage 3 unspecified: Secondary | ICD-10-CM

## 2021-09-20 MED ORDER — ALBUTEROL SULFATE HFA 108 (90 BASE) MCG/ACT IN AERS
INHALATION_SPRAY | RESPIRATORY_TRACT | 1 refills | Status: DC
  Filled 2021-09-20: qty 6.7, 17d supply, fill #0
  Filled 2022-03-15: qty 6.7, 17d supply, fill #1

## 2021-09-20 MED ORDER — GLIPIZIDE 5 MG PO TABS
ORAL_TABLET | ORAL | 2 refills | Status: DC
  Filled 2021-09-20: qty 60, fill #0
  Filled 2021-09-21: qty 30, 60d supply, fill #0
  Filled 2022-03-15: qty 30, 60d supply, fill #1

## 2021-09-20 MED ORDER — OZEMPIC (0.25 OR 0.5 MG/DOSE) 2 MG/1.5ML ~~LOC~~ SOPN: PEN_INJECTOR | SUBCUTANEOUS | 1 refills | Status: DC

## 2021-09-20 MED ORDER — ROSUVASTATIN CALCIUM 20 MG PO TABS
20.0000 mg | ORAL_TABLET | Freq: Every day | ORAL | 3 refills | Status: DC
  Filled 2021-09-20: qty 90, 90d supply, fill #0
  Filled 2021-12-07: qty 90, 90d supply, fill #1
  Filled 2022-03-15: qty 90, 90d supply, fill #2
  Filled 2022-06-05: qty 90, 90d supply, fill #3

## 2021-09-20 NOTE — Therapy (Signed)
OUTPATIENT OCCUPATIONAL THERAPY TREATMENT NOTE   Patient Name: Todd Rice MRN: [id number] DOB:[birth year], 66 y.o., male Today's Date: 09/22/2021  PCP: Unk Pinto, MD  REFERRING PROVIDER: Ernestine Mcmurray, NP   END OF SESSION:   OT End of Session - 09/22/21 0737     Visit Number 5    Number of Visits 17    Date for OT Re-Evaluation 11/28/21    Authorization Type UHC Medicare, follows Medicare guidelines    Authorization - Number of Visits 5    Progress Note Due on Visit 10    OT Start Time 0720    OT Stop Time 0800    OT Time Calculation (min) 40 min    Activity Tolerance Patient tolerated treatment well    Behavior During Therapy WFL for tasks assessed/performed                Past Medical History:  Diagnosis Date   Adult BMI 50.0-59.9 kg/sq m    52.77   Anal fissure    Anxiety    Diabetes mellitus without complication ([facility])    borderline   Elevated PSA 08/13/2019   GERD (gastroesophageal reflux disease)    30 years ago, maybe from peptic ulcer   H/O: gout    STABLE   Hepatic steatosis 10/01/11   History of kidney stones    Hyperlipidemia    Hypertension    Kidney tumor    right upper kidney tumor, monitoring   OSA on CPAP    [facility]   Parkinson disease ([place]) 01/04/2016   REM sleep behavior disorder 12/06/2016   Right ureteral stone    Ureteral calculus, right 06/12/2011   Vitamin D deficiency    Weakness    Past Surgical History:  Procedure Laterality Date   ANAL FISSURE REPAIR  10-12-2000   COLONOSCOPY N/A 07/01/2012   Procedure: COLONOSCOPY;  Surgeon: Lafayette Dragon, MD;  [facility];  Service: Endoscopy;  Laterality: N/A;   CYSTOSCOPY WITH RETROGRADE PYELOGRAM, URETEROSCOPY AND STENT PLACEMENT Bilateral 02/26/2013   Procedure: CYSTOSCOPY WITH RETROGRADE PYELOGRAM, URETEROSCOPY AND STENT PLACEMENT;  Surgeon: Alexis Frock, MD;  [facility];  Service: Urology;  Laterality: Bilateral;   HOLMIUM LASER APPLICATION Bilateral  25/63/8937   Procedure: HOLMIUM LASER APPLICATION;  Surgeon: Alexis Frock, MD;  [facility];  Service: Urology;  Laterality: Bilateral;   kidney stone removal     06/2011-stent placement   LEFT MEDIAL FEMORAL CONDYLE DEBRIDEMENT & DRILLING/ REMOVAL LOOSE BODY  09-15-2003   NASAL SEPTUM SURGERY  1985   STONE EXTRACTION WITH BASKET  06/12/2011   Procedure: STONE EXTRACTION WITH BASKET;  Surgeon: Claybon Jabs, MD;  [facility];  Service: Urology;  Laterality: Right;   Patient Active Problem List   Diagnosis Date Noted   Constipation 03/24/2021   Gout 03/23/2021   Primary renal papillary carcinoma, right ([place]) 02/18/2020   Hepatic steatosis 02/18/2020   Major depression in remission ([place]) 11/11/2019   Central sleep apnea due to Cheyne-Stokes respiration 08/31/2019   Hyperlipidemia associated with type 2 diabetes mellitus ([place]) 07/09/2019   Aortic atherosclerosis ([place]) by CXR on 06/10/2018 06/10/2018   Type 2 diabetes mellitus with stage 3a chronic kidney disease, without long-term current use of insulin ([place]) 01/21/2018   OSA treated with BiPAP 01/03/2017   REM sleep behavior disorder 12/06/2016   CKD stage 3 due to type 2 diabetes mellitus ([place]) 10/23/2016   Medication management 10/23/2016   Vitamin D deficiency 10/23/2016  Parkinson's disease ([place]) 01/04/2016   Seasonal and perennial allergic rhinitis 06/21/2014   Asthma, mild intermittent, well-controlled 06/21/2014   Pseudomonas aeruginosa colonization 10/08/2012   CAD (coronary artery disease) 11/15/2011   Essential hypertension 11/15/2011   Morbid obesity with BMI of 40.0-44.9, adult ([place]) 11/15/2011    ONSET DATE: 08/25/21 (referral date)   REFERRING DIAG: Parkinson's Disease   THERAPY DIAG:  Other lack of coordination  Other symptoms and signs involving the nervous system  Other symptoms and signs involving the musculoskeletal system  Abnormal posture  Tremor  Unsteadiness on  feet   PERTINENT HISTORY: Parkinson's Disease, CAD, HTN, HDL, obesity, asthma, DM, CKD, vitamin D deficiency, REM sleep disorder, OSA, aortic atherosclerosis, hx of gout, GERD, hx of anxiety   PRECAUTIONS: None   SUBJECTIVE: hand is doing good--no pain     PAIN:  Are you having pain? No   OBJECTIVE:   TODAY'S TREATMENT:   Note:  movement to pt tolerance with R hand/5th digit.  Pt with wound at base of 5th digit of R hand, but appears to be healing well with scab in place.  Pt denies pain.  PWR! Moves (up, rock, twist) in modified quadruped x 10-20 each with min cues For incr movement amplitude, particularly with R hand.  Closed-chain shoulder flex floor>overhead in standing, then diagonals to each side in standing with min cueing for large amplitude movements  Tossing medium ball against wall with mod cueing for large amplitude movements/PWR!   Dealing cards with each hand with focus on large amplitude movements with hand/wrist with min cueing L hand and mod cueing R hand.  Picking up coins 1 at a time until you get 5 in your hand then manipulate in-hand to stack 1 at a time, with min cueing for large amplitude movements.   Forearm gym with RUE for incr wrist ROM and coordination with min v.c. for large amplitude movements   PATIENT EDUCATION: Education details: n/a today Person educated:  Education method:  Education comprehension:      HOME EXERCISE PROGRAM: Not yet issued updated HEP.  Pt continues to perform previous HEP with cueing for large amplitude movements.    CLINICAL IMPRESSION: Pt is progressing towards goals and responds well to cueing for large amplitude movements.  Pt is progressing with improved in-hand manipulation today after stretching and PWR! Moves first.   PERFORMANCE DEFICITS in functional skills including ADLs, IADLs, coordination, dexterity, tone, ROM, FMC, GMC, balance, and UE functional use, cognitive skills including  n/a , and psychosocial  skills including habits and routines and behaviors.    IMPAIRMENTS are limiting patient from ADLs, IADLs, and leisure.    COMORBIDITIES may have co-morbidities  that affects occupational performance. Patient will benefit from skilled OT to address above impairments and improve overall function.   MODIFICATION OR ASSISTANCE TO COMPLETE EVALUATION: Min-Moderate modification of tasks or assist with assess necessary to complete an evaluation.   OT OCCUPATIONAL PROFILE AND HISTORY: Detailed assessment: Review of records and additional review of physical, cognitive, psychosocial history related to current functional performance.   CLINICAL DECISION MAKING: Moderate - several treatment options, min-mod task modification necessary   REHAB POTENTIAL: Good   EVALUATION COMPLEXITY: Moderate       GOALS: Potential Goals reviewed with patient? Yes    SHORT TERM GOALS: Target date: 09/27/2021   Pt will be independent with updated PD-specific HEP. Baseline: Goal status: INITIAL   2.  Pt will improve bilateral coordination to be able to fasten/unfasten 3  buttons in 34sec or less. Baseline:  50, 38.06sec Goal status: INITIAL   3.  Pt will be able to write at least 3 sentences with only min decr in size.   Goal status: INITIAL   4.  Pt will improve R hand coordination for ADLs/IADLs as shown by completing 9-hole peg test in 49sec or less. Baseline:  53.94sec Goal status: INITIAL     LONG TERM GOALS: Target date: 10/25/2021   Pt will verbalize understanding of strategies for ADLs/IADLs to incr ease, incr safety and decr risk of future complications (including strategies for eating, writing, shaving, reaching).   Goal status: INITIAL   2.   Pt will improve coordination/functional reaching with RUE as shown by improving score on box and blocks test by at least 3. Baseline:  43 blocks Goal status: INITIAL   3.  Pt will improve L hand coordination for ADLs/IADLs as shown by completing 9-hole  peg test in 39sec or less. Baseline: 43sec Goal status: INITIAL   4.  Pt will improve R hand coordination for ADLs/IADLs as shown by completing 9-hole peg test in 45sec or less. Baseline:  53.94sec Goal status: INITIAL   5.  Pt will improve coordination/functional reaching with LUE as shown by improving score on box and blocks test by at least 3. Baseline:  42 blocks Goal status: INITIAL     PLAN: OT FREQUENCY: 2x/week   OT DURATION: 8 weeks or 16 visits over 12 weeks due to scheduling +eval   PLANNED INTERVENTIONS: self care/ADL training, therapeutic exercise, therapeutic activity, neuromuscular re-education, manual therapy, manual lymph drainage, passive range of motion, balance training, functional mobility training, aquatic therapy, splinting, electrical stimulation, ultrasound, paraffin, fluidotherapy, moist heat, cryotherapy, patient/family education, energy conservation, coping strategies training, and DME and/or AE instructions   RECOMMENDED OTHER SERVICES: current with [facility], just completed PT at [facility]   CONSULTED AND AGREED [place]: Patient   PLAN FOR NEXT SESSION:   (monitor R hand/5th digit for pain due to recent injury), continue with functional reaching, coordination, large amplitude movements, bilateral hand coordination, UE stretching        Vianne Bulls, OTR/L [facility] [address] [place], [place]  [address] [phone] phone [phone] 09/22/21 8:37 AM

## 2021-09-20 NOTE — Progress Notes (Signed)
ANNUAL WELLNESS VISIT AND FOLLOW UP ? ?Assessment and Plan: ? ? ?Annual Medicare Wellness Visit ?Due annually  ?Health maintenance reviewed ?- send diabetes eye report ?- can get shingrix at pharmacy  ? ?Atherosclerosis of aorta ([place]) - per CXR 06/2018 ?Control blood pressure, cholesterol, glucose, increase exercise.  ? ?Coronary artery disease involving native coronary artery of native heart without angina pectoris ?Control blood pressure, cholesterol, glucose, increase exercise.  ?Go to the ER if any chest pain, shortness of breath, nausea, dizziness, severe HA, changes vision/speech ? ?Essential hypertension ?- continue medications, DASH diet, exercise and monitor at home. Call if greater than 130/80.  ? ?Controlled type 2 diabetes mellitus with diabetic dermatitis, without long-term current use of insulin ([place]) ?Discussed general issues about diabetes pathophysiology and management., Educational material distributed., Suggested low cholesterol diet., Encouraged aerobic exercise., Discussed foot care., Reminded to get yearly retinal exam.  ?Off of januvia, uses glipizide PRN with elevations only ?Tolerated ozempic 0.25 mg weekly well, will send scipt, increase to 0.5 mg weekly if tolerating ?Check A1C q76m  ? ?Hyperlipidemia associated with T2DM ([place] ?-taking rosuvastatin 20 mg daily and tolerating, continue with zetia 10 mg daily  ?- GI upset with rosuvastatin 40 mg daily  ?- LDL goal <70 ?check lipids, decrease fatty foods, increase activity.  ? ?CKD III associated with T2DM ([place] ?Increase fluids, avoid NSAIDS, monitor sugars, will monitor ?Continue ARB ?-    CMP/GFR ? ?Class 3 obesity due to excess calories with serious comorbidity and body mass index (BMI) of 40.0 to 44.9 in adult ([facility]  ?- long discussion about weight loss, diet, and exercise ?- slowly trending down, tolerated ozempic low dose, will restart ?-     Lipid panel ?-     Hemoglobin A1c ? ?Parkinson disease ([place] ?Continue neuro  ?New referral to  Dr. TCarles Collet Dr. WJannifer Franklinretired ?PT routinely as needed with benefit; doing exercises at home;  ? ?Medication management ?Each visit  ? ?Vitamin D deficiency ?Continue supplement; goal 60-100 ? ?Seasonal and perennial allergic rhinitis ?- Allegra OTC, increase H20, allergy hygiene explained. ? ?Asthma, mild intermittent, well-controlled ?Weight loss, PRN albuterol, avoid triggers ? ?Right renal cell carcinoma (HCC) ?Dr. MTresa Moorefollowing, active monitoring ?- requests PSA and CMP be drawn and forwarded for upcoming visit ? ?Pseudomonas aeruginosa colonization ?Monitor, urology following ?Recently est with ID Dr. VTommy Medalfor IV treatment if symptomatic  ? ?OSA treated with BiPAP ?Dr. DBrett Fairyfollowing- continue BiPAP, helping with daytime fatigue, weight loss still advised.  ? ?REM sleep behavior disorder ?continue neuro follow up ? ?Major depression in remission ([facility] ?In remission off of medication; monitor  ? ?Constipation ?Well managed alternating miralax and senokot ?Increase frequency if needed ? ?Orders Placed This Encounter  ?Procedures  ? PSA  ? CBC with Differential/Platelet  ? COMPLETE METABOLIC PANEL WITH GFR  ? Magnesium  ? Lipid panel  ? TSH  ? Hemoglobin A1c  ? Ambulatory referral to Neurology  ? ? ?Discussed med's effects and SE's. Screening labs and tests as requested with regular follow-up as recommended. ? ?Future Appointments  ?Date Time Provider DEdgeworth ?09/22/2021  7:15 AM FDelton Prairie OT OPRC-NR OPRCNR  ?09/27/2021  7:15 AM RJairo Ben KSelmer Dominion OT OPRC-NR OPRCNR  ?09/29/2021  7:15 AM FDelton Prairie OT OPRC-NR OPRCNR  ?10/04/2021  7:15 AM FDelton Prairie OT OPRC-NR OPRCNR  ?10/06/2021  7:15 AM FDelton Prairie OT OPRC-NR OPRCNR  ?10/11/2021  9:30 AM FDelton Prairie OT [facility] ?  10/13/2021  7:15 AM Delton Prairie, OT OPRC-NR OPRCNR  ?10/18/2021  7:15 AM Delton [place], OT OPRC-NR OPRCNR  ?10/20/2021  7:15 AM Delton [place], OT OPRC-NR OPRCNR  ?10/25/2021  7:15 AM Jairo Ben,  Selmer Dominion, OT OPRC-NR OPRCNR  ?10/27/2021  8:00 AM Hulda Marin, OT OPRC-NR OPRCNR  ?12/06/2021  3:00 PM Carleene Mains, RPH GAAM-GAAIM None  ?02/08/2022  8:30 AM Ward Givens, NP GNA-GNA None  ?03/28/2022  3:00 PM [name], NP GAAM-GAAIM None  ?04/20/2022 11:45 AM Sharen Counter, CCC-SLP OPRC-BF OPRCBF  ?04/20/2022 12:15 PM Frazier Butt, PT OPRC-BF OPRCBF  ?09/21/2022  4:00 PM Darrol Jump, NP GAAM-GAAIM None  ? ? ? ?Plan:  ? ?During the course of the visit the patient was educated and counseled about appropriate screening and preventive services including:  ? ?Pneumococcal vaccine  ?Prevnar 13 ?Influenza vaccine ?Td vaccine ?Screening electrocardiogram ?Bone densitometry screening ?Colorectal cancer screening ?Diabetes screening ?Glaucoma screening ?Nutrition counseling  ?Advanced directives: requested ? ? ? ?HPI ?66 y.o. male patient presents for AWV and 3 month follow up. He has CAD (coronary artery disease); Essential hypertension; Morbid obesity with BMI of 40.0-44.9, adult ([place]); Pseudomonas aeruginosa colonization; Seasonal and perennial allergic rhinitis; Asthma, mild intermittent, well-controlled; Parkinson's disease ([place]); CKD stage 3 due to type 2 diabetes mellitus ([place]); Medication management; Vitamin D deficiency; REM sleep behavior disorder; OSA treated with BiPAP; Type 2 diabetes mellitus with stage 3a chronic kidney disease, without long-term current use of insulin ([place]); Aortic atherosclerosis ([place]) by CXR on 06/10/2018; Hyperlipidemia associated with type 2 diabetes mellitus ([place]); Central sleep apnea due to Cheyne-Stokes respiration; Major depression in remission ([place]); Primary renal papillary carcinoma, right ([place]); Hepatic steatosis; Gout; and Constipation on their problem list. ? 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 Accompanied by his wife.  ? ?He recently had mechanical fall and R hand laceration, had sutures removed 1 week ago, working with OT and doing well, denies pain or  loss of function.  ? ?He has depression in remission off of medication (wellbutrin). He has xanax that he uses occasionally, typically 2-3 tabs per month. Rare use of ambien for sleep (prescribed by Dr. Brett Fairy).  ? ?He is on BiPAP for OSA/central sleep apnea per Dr. Brett Fairy, endorses 100% compliance and restorative sleep.  ? ?Also has R renal papillary carcinoma, Follows annually with Dr. Tresa Moore, has been stable for several years. He has hx of chronic pseudomonas bladder colonization, now est with Dr. Tommy Medal so if recurrent UTI sx planning on IV abx.  ? ?Follows with neurology Dr. [name] for Parkinson's. He reports that he has been taking the mirapex and sinemet and feels well controlled. Does continue to have significant L hand tremor. Just finished up with PT earlier this month, now working with OT twice a week.  ? ?Dr. [name] retiring, req referral to Dr. Carles Collet.  ? ?He is on miralax and senokot once a day for constipation and reports good results.  ? ?Has mild asthma, has albuterol uses q2-3 days PRN if needed when outdoors and ragweed season.   ? ?BMI is Body mass index is 40.72 kg/m?., he has been working on , diet and exercise, strength exercises and walking several times a week.  ?Wt Readings from Last 3 Encounters:  ?09/20/21 260 lb (117.9 kg)  ?09/14/21 261 lb 9.6 oz (118.7 kg)  ?09/06/21 264 lb 5.3 oz (119.9 kg)  ? ?Aortic atherosclerosis per CXR 2020. Normal stress test 2013 by Dr. Percival Spanish  due to CAD per CT. ? ?His blood pressure has been controlled at home, today their BP is BP: 110/76  ?He does workout. He denies chest pain, shortness of breath, dizziness. ? ?He is on cholesterol medication (zetia 10 mg daily, rosuvastatin 20 mg daily, didn't tolerate 40 mg daily) and denies myalgias. His cholesterol is not at goal of 70 or less. The cholesterol last visit was:   ?Lab Results  ?Component Value Date  ? CHOL 162 06/23/2021  ? HDL 33 (L) 06/23/2021  ? [place] 93 06/23/2021  ? TRIG 275 (H) 06/23/2021  ?  CHOLHDL 4.9 06/23/2021  ? ?He has been working on diet and exercise for diabetes ?CKD, he is on ARB ?Hyperlipemia on crestor ?Aortic atherosclerosis he is on bASA ?he is on metformin 4 a day  ?Only takes

## 2021-09-21 ENCOUNTER — Other Ambulatory Visit (HOSPITAL_BASED_OUTPATIENT_CLINIC_OR_DEPARTMENT_OTHER): Payer: Self-pay

## 2021-09-21 ENCOUNTER — Other Ambulatory Visit: Payer: Self-pay | Admitting: Adult Health

## 2021-09-21 DIAGNOSIS — E1122 Type 2 diabetes mellitus with diabetic chronic kidney disease: Secondary | ICD-10-CM

## 2021-09-21 LAB — CBC WITH DIFFERENTIAL/PLATELET
Absolute Monocytes: 739 cells/uL (ref 200–950)
Basophils Absolute: 53 cells/uL (ref 0–200)
Basophils Relative: 0.6 %
Eosinophils Absolute: 150 cells/uL (ref 15–500)
Eosinophils Relative: 1.7 %
HCT: 44.1 % (ref 38.5–50.0)
Hemoglobin: 15.1 g/dL (ref 13.2–17.1)
Lymphs Abs: 2622 cells/uL (ref 850–3900)
MCH: 28.8 pg (ref 27.0–33.0)
MCHC: 34.2 g/dL (ref 32.0–36.0)
MCV: 84.2 fL (ref 80.0–100.0)
MPV: 10.5 fL (ref 7.5–12.5)
Monocytes Relative: 8.4 %
Neutro Abs: 5236 cells/uL (ref 1500–7800)
Neutrophils Relative %: 59.5 %
Platelets: 186 10*3/uL (ref 140–400)
RBC: 5.24 10*6/uL (ref 4.20–5.80)
RDW: 13.5 % (ref 11.0–15.0)
Total Lymphocyte: 29.8 %
WBC: 8.8 10*3/uL (ref 3.8–10.8)

## 2021-09-21 LAB — PSA: PSA: 2.69 ng/mL (ref <=4.00)

## 2021-09-21 LAB — LIPID PANEL
Cholesterol: 121 mg/dL (ref <200)
HDL: 34 mg/dL — ABNORMAL LOW (ref >=40)
LDL Cholesterol (Calc): 59 mg/dL (calc)
Non-HDL Cholesterol (Calc): 87 mg/dL (calc) (ref <130)
Total CHOL/HDL Ratio: 3.6 (calc) (ref <5.0)
Triglycerides: 228 mg/dL — ABNORMAL HIGH (ref <150)

## 2021-09-21 LAB — COMPLETE METABOLIC PANEL WITH GFR
AG Ratio: 1.4 (calc) (ref 1.0–2.5)
ALT: 12 U/L (ref 9–46)
AST: 18 U/L (ref 10–35)
Albumin: 4.4 g/dL (ref 3.6–5.1)
Alkaline phosphatase (APISO): 48 U/L (ref 35–144)
BUN: 21 mg/dL (ref 7–25)
CO2: 26 mmol/L (ref 20–32)
Calcium: 10.7 mg/dL — ABNORMAL HIGH (ref 8.6–10.3)
Chloride: 99 mmol/L (ref 98–110)
Creat: 1.26 mg/dL (ref 0.70–1.35)
Globulin: 3.2 g/dL (calc) (ref 1.9–3.7)
Glucose, Bld: 123 mg/dL — ABNORMAL HIGH (ref 65–99)
Potassium: 4 mmol/L (ref 3.5–5.3)
Sodium: 138 mmol/L (ref 135–146)
Total Bilirubin: 0.5 mg/dL (ref 0.2–1.2)
Total Protein: 7.6 g/dL (ref 6.1–8.1)
eGFR: 63 mL/min/{1.73_m2} (ref >=60)

## 2021-09-21 LAB — TSH: TSH: 2.6 mIU/L (ref 0.40–4.50)

## 2021-09-21 LAB — MAGNESIUM: Magnesium: 1.7 mg/dL (ref 1.5–2.5)

## 2021-09-21 LAB — HEMOGLOBIN A1C
Hgb A1c MFr Bld: 7.4 % of total Hgb — ABNORMAL HIGH (ref <5.7)
Mean Plasma Glucose: 166 mg/dL
eAG (mmol/L): 9.2 mmol/L

## 2021-09-21 MED ORDER — OZEMPIC (0.25 OR 0.5 MG/DOSE) 2 MG/3ML ~~LOC~~ SOPN
PEN_INJECTOR | SUBCUTANEOUS | 1 refills | Status: DC
  Filled 2021-09-21: qty 3, 30d supply, fill #0
  Filled 2021-10-28: qty 3, 30d supply, fill #1

## 2021-09-21 MED ORDER — MAGNESIUM 250 MG PO TABS: 1.0000 | ORAL_TABLET | Freq: Two times a day (BID) | ORAL | Status: AC

## 2021-09-22 ENCOUNTER — Ambulatory Visit: Payer: Medicare Other | Admitting: Occupational Therapy

## 2021-09-22 ENCOUNTER — Encounter: Payer: Self-pay | Admitting: Occupational Therapy

## 2021-09-22 DIAGNOSIS — R29818 Other symptoms and signs involving the nervous system: Secondary | ICD-10-CM | POA: Diagnosis not present

## 2021-09-22 DIAGNOSIS — R251 Tremor, unspecified: Secondary | ICD-10-CM | POA: Diagnosis not present

## 2021-09-22 DIAGNOSIS — R293 Abnormal posture: Secondary | ICD-10-CM | POA: Diagnosis not present

## 2021-09-22 DIAGNOSIS — R2681 Unsteadiness on feet: Secondary | ICD-10-CM

## 2021-09-22 DIAGNOSIS — R1310 Dysphagia, unspecified: Secondary | ICD-10-CM | POA: Diagnosis not present

## 2021-09-22 DIAGNOSIS — R471 Dysarthria and anarthria: Secondary | ICD-10-CM | POA: Diagnosis not present

## 2021-09-22 DIAGNOSIS — R2689 Other abnormalities of gait and mobility: Secondary | ICD-10-CM | POA: Diagnosis not present

## 2021-09-22 DIAGNOSIS — R278 Other lack of coordination: Secondary | ICD-10-CM | POA: Diagnosis not present

## 2021-09-22 DIAGNOSIS — R29898 Other symptoms and signs involving the musculoskeletal system: Secondary | ICD-10-CM | POA: Diagnosis not present

## 2021-09-23 NOTE — Therapy (Addendum)
OUTPATIENT OCCUPATIONAL THERAPY TREATMENT NOTE   Patient Name: Todd Rice MRN: [id number] DOB:1955-09-05, 66 y.o., male Today's Date: 09/26/2021  PCP: Unk Pinto, MD  REFERRING PROVIDER: Ernestine Mcmurray, NP   END OF SESSION:   OT End of Session - 09/26/21 0733     Visit Number 6    Number of Visits 17    Date for OT Re-Evaluation 11/28/21    Authorization Type UHC Medicare, follows Medicare guidelines    Authorization - Number of Visits 6    Progress Note Due on Visit 10    OT Start Time 0720    OT Stop Time 0800    OT Time Calculation (min) 40 min    Activity Tolerance Patient tolerated treatment well    Behavior During Therapy WFL for tasks assessed/performed                 Past Medical History:  Diagnosis Date   Adult BMI 50.0-59.9 kg/sq m    52.77   Anal fissure    Anxiety    Diabetes mellitus without complication (HCC)    borderline   Elevated PSA 08/13/2019   GERD (gastroesophageal reflux disease)    30 years ago, maybe from peptic ulcer   H/O: gout    STABLE   Hepatic steatosis 10/01/11   History of kidney stones    Hyperlipidemia    Hypertension    Kidney tumor    right upper kidney tumor, monitoring   OSA on CPAP    [employer] D[facility]   Parkinson disease ([place]) 01/04/2016   REM sleep behavior disorder 12/06/2016   Right ureteral stone    Ureteral calculus, right 06/12/2011   Vitamin D deficiency    Weakness    Past Surgical History:  Procedure Laterality Date   ANAL FISSURE REPAIR  10-12-2000   COLONOSCOPY N/A 07/01/2012   Procedure: COLONOSCOPY;  Surgeon: Lafayette Dragon, MD;  Location: [facility];  Service: Endoscopy;  Laterality: N/A;   CYSTOSCOPY WITH RETROGRADE PYELOGRAM, URETEROSCOPY AND STENT PLACEMENT Bilateral 02/26/2013   Procedure: CYSTOSCOPY WITH RETROGRADE PYELOGRAM, URETEROSCOPY AND STENT PLACEMENT;  Surgeon: Alexis Frock, MD;  Location: [facility];  Service: Urology;  Laterality: Bilateral;   HOLMIUM LASER APPLICATION Bilateral  00/93/8182   Procedure: HOLMIUM LASER APPLICATION;  Surgeon: Alexis Frock, MD;  Location: [facility];  Service: Urology;  Laterality: Bilateral;   kidney stone removal     06/2011-stent placement   LEFT MEDIAL FEMORAL CONDYLE DEBRIDEMENT & DRILLING/ REMOVAL LOOSE BODY  09-15-2003   NASAL SEPTUM SURGERY  1985   STONE EXTRACTION WITH BASKET  06/12/2011   Procedure: STONE EXTRACTION WITH BASKET;  Surgeon: Claybon Jabs, MD;  [facility];  Service: Urology;  Laterality: Right;   Patient Active Problem List   Diagnosis Date Noted   Constipation 03/24/2021   Gout 03/23/2021   Primary renal papillary carcinoma, right ([place]) 02/18/2020   Hepatic steatosis 02/18/2020   Major depression in remission ([place]) 11/11/2019   Central sleep apnea due to Cheyne-Stokes respiration 08/31/2019   Hyperlipidemia associated with type 2 diabetes mellitus ([place]) 07/09/2019   Aortic atherosclerosis ([place]) by CXR on 06/10/2018 06/10/2018   Type 2 diabetes mellitus with stage 3a chronic kidney disease, without long-term current use of insulin ([place]) 01/21/2018   OSA treated with BiPAP 01/03/2017   REM sleep behavior disorder 12/06/2016   CKD stage 3 due to type 2 diabetes mellitus ([place]) 10/23/2016   Medication management 10/23/2016   Vitamin D deficiency 10/23/2016  Parkinson's disease ([place]) 01/04/2016   Seasonal and perennial allergic rhinitis 06/21/2014   Asthma, mild intermittent, well-controlled 06/21/2014   Pseudomonas aeruginosa colonization 10/08/2012   CAD (coronary artery disease) 11/15/2011   Essential hypertension 11/15/2011   Morbid obesity with BMI of 40.0-44.9, adult ([place]) 11/15/2011    ONSET DATE: 08/25/21 (referral date)   REFERRING DIAG: Parkinson's Disease   THERAPY DIAG:  Other lack of coordination  Other symptoms and signs involving the nervous system  Other symptoms and signs involving the musculoskeletal system  Abnormal posture  Unsteadiness on  feet  Tremor   PERTINENT HISTORY: Parkinson's Disease, CAD, HTN, HDL, obesity, asthma, DM, CKD, vitamin D deficiency, REM sleep disorder, OSA, aortic atherosclerosis, hx of gout, GERD, hx of anxiety   PRECAUTIONS: None   SUBJECTIVE: hand is healing good   PAIN:  Are you having pain? No   OBJECTIVE:   TODAY'S TREATMENT:   Note:  movement to pt tolerance with R hand/5th digit.  Pt with wound at base of 5th digit of R hand, but appears to be healing well and is completely closed.  Pt denies pain.  Closed-chain shoulder flex floor>overhead in standing, then diagonals to each side in standing with min cueing for large amplitude movements  Rotating 2 golf balls in hand with R hand with mod-max difficulty/cues and L hand with mod difficulty.   Rotating ball in fingertips with R hand with mod   Manipulating small pegs in hand to place in pegboard by color with R hand with mod difficulty/incr time, and L hand with min difficulty.     Bilateral individual finger lifts and "walking" with individual finger abduction/abduction, and isolated IP flex/ext.   PATIENT EDUCATION: Education details: thumb circles (in/around cup for cueing for large amplitude movements) Person educated:  patient Education method:  explanation, demo, handout Education comprehension:  verbalized understanding, returned demo     HOME EXERCISE PROGRAM: Pt continues to perform previous HEP with cueing for large amplitude movements.  09/26/21:  Thumb circles   CLINICAL IMPRESSION: Pt is progressing towards goals and responds well to cueing for large amplitude movements.  Pt is progressing with improved in-hand manipulation.   PERFORMANCE DEFICITS in functional skills including ADLs, IADLs, coordination, dexterity, tone, ROM, FMC, GMC, balance, and UE functional use, cognitive skills including  n/a , and psychosocial skills including habits and routines and behaviors.    IMPAIRMENTS are limiting patient from ADLs,  IADLs, and leisure.    COMORBIDITIES may have co-morbidities  that affects occupational performance. Patient will benefit from skilled OT to address above impairments and improve overall function.   MODIFICATION OR ASSISTANCE TO COMPLETE EVALUATION: Min-Moderate modification of tasks or assist with assess necessary to complete an evaluation.   OT OCCUPATIONAL PROFILE AND HISTORY: Detailed assessment: Review of records and additional review of physical, cognitive, psychosocial history related to current functional performance.   CLINICAL DECISION MAKING: Moderate - several treatment options, min-mod task modification necessary   REHAB POTENTIAL: Good   EVALUATION COMPLEXITY: Moderate       GOALS: Potential Goals reviewed with patient? Yes    SHORT TERM GOALS: Target date: 09/27/2021   Pt will be independent with updated PD-specific HEP. Baseline: Goal status: INITIAL   2.  Pt will improve bilateral coordination to be able to fasten/unfasten 3 buttons in 34sec or less. Baseline:  50, 38.06sec Goal status: INITIAL   3.  Pt will be able to write at least 3 sentences with only min decr in size.  Goal status: INITIAL   4.  Pt will improve R hand coordination for ADLs/IADLs as shown by completing 9-hole peg test in 49sec or less. Baseline:  53.94sec Goal status: INITIAL     LONG TERM GOALS: Target date: 10/25/2021   Pt will verbalize understanding of strategies for ADLs/IADLs to incr ease, incr safety and decr risk of future complications (including strategies for eating, writing, shaving, reaching).   Goal status: INITIAL   2.   Pt will improve coordination/functional reaching with RUE as shown by improving score on box and blocks test by at least 3. Baseline:  43 blocks Goal status: INITIAL   3.  Pt will improve L hand coordination for ADLs/IADLs as shown by completing 9-hole peg test in 39sec or less. Baseline: 43sec Goal status: INITIAL   4.  Pt will improve R hand  coordination for ADLs/IADLs as shown by completing 9-hole peg test in 45sec or less. Baseline:  53.94sec Goal status: INITIAL   5.  Pt will improve coordination/functional reaching with LUE as shown by improving score on box and blocks test by at least 3. Baseline:  42 blocks Goal status: INITIAL     PLAN: OT FREQUENCY: 2x/week   OT DURATION: 8 weeks or 16 visits over 12 weeks due to scheduling +eval   PLANNED INTERVENTIONS: self care/ADL training, therapeutic exercise, therapeutic activity, neuromuscular re-education, manual therapy, manual lymph drainage, passive range of motion, balance training, functional mobility training, aquatic therapy, splinting, electrical stimulation, ultrasound, paraffin, fluidotherapy, moist heat, cryotherapy, patient/family education, energy conservation, coping strategies training, and DME and/or AE instructions   RECOMMENDED OTHER SERVICES: current with ST, just [facility] PT at [facility]   CONSULTED AND AGREED [place]: Patient   PLAN FOR NEXT SESSION:   check STGs; continue with functional reaching, coordination, large amplitude movements, bilateral hand coordination, UE stretching        Vianne Bulls, OTR/L [facility] [address] [place], [place]  [address] [phone] phone [phone] 09/26/21 8:25 AM

## 2021-09-26 ENCOUNTER — Encounter: Payer: Self-pay | Admitting: Occupational Therapy

## 2021-09-26 ENCOUNTER — Ambulatory Visit: Payer: Medicare Other | Admitting: Occupational Therapy

## 2021-09-26 DIAGNOSIS — R471 Dysarthria and anarthria: Secondary | ICD-10-CM | POA: Diagnosis not present

## 2021-09-26 DIAGNOSIS — R278 Other lack of coordination: Secondary | ICD-10-CM | POA: Diagnosis not present

## 2021-09-26 DIAGNOSIS — R29898 Other symptoms and signs involving the musculoskeletal system: Secondary | ICD-10-CM

## 2021-09-26 DIAGNOSIS — R1310 Dysphagia, unspecified: Secondary | ICD-10-CM | POA: Diagnosis not present

## 2021-09-26 DIAGNOSIS — R2681 Unsteadiness on feet: Secondary | ICD-10-CM

## 2021-09-26 DIAGNOSIS — R251 Tremor, unspecified: Secondary | ICD-10-CM | POA: Diagnosis not present

## 2021-09-26 DIAGNOSIS — R29818 Other symptoms and signs involving the nervous system: Secondary | ICD-10-CM | POA: Diagnosis not present

## 2021-09-26 DIAGNOSIS — R293 Abnormal posture: Secondary | ICD-10-CM

## 2021-09-26 DIAGNOSIS — R2689 Other abnormalities of gait and mobility: Secondary | ICD-10-CM | POA: Diagnosis not present

## 2021-09-26 NOTE — Therapy (Signed)
OUTPATIENT OCCUPATIONAL THERAPY TREATMENT NOTE   Patient Name: Todd Rice MRN: [id number] DOB:[birth year], 66 y.o., male Today's Date: 09/29/2021  PCP: Unk Pinto, MD  REFERRING PROVIDER: Ernestine Mcmurray, NP   END OF SESSION:   OT End of Session - 09/29/21 0729     Visit Number 7    Number of Visits 17    Date for OT Re-Evaluation 11/28/21    Authorization Type UHC Medicare, follows Medicare guidelines    Authorization - Number of Visits 7    Progress Note Due on Visit 10    OT Start Time 0721    OT Stop Time 0800    OT Time Calculation (min) 39 min    Activity Tolerance Patient tolerated treatment well    Behavior During Therapy WFL for tasks assessed/performed                  Past Medical History:  Diagnosis Date   Adult BMI 50.0-59.9 kg/sq m    52.77   Anal fissure    Anxiety    Diabetes mellitus without complication (HCC)    borderline   Elevated PSA 08/13/2019   GERD (gastroesophageal reflux disease)    30 years ago, maybe from peptic ulcer   H/O: gout    STABLE   Hepatic steatosis 10/01/11   History of kidney stones    Hyperlipidemia    Hypertension    Kidney tumor    right upper kidney tumor, monitoring   OSA on CPAP    [facility] [facility]   Parkinson disease ([place]) 01/04/2016   REM sleep behavior disorder 12/06/2016   Right ureteral stone    Ureteral calculus, right 06/12/2011   Vitamin D deficiency    Weakness    Past Surgical History:  Procedure Laterality Date   ANAL FISSURE REPAIR  10-12-2000   COLONOSCOPY N/A 07/01/2012   Procedure: COLONOSCOPY;  Surgeon: Lafayette Dragon, MD;  Location: [facility];  Service: Endoscopy;  Laterality: N/A;   CYSTOSCOPY WITH RETROGRADE PYELOGRAM, URETEROSCOPY AND STENT PLACEMENT Bilateral 02/26/2013   Procedure: CYSTOSCOPY WITH RETROGRADE PYELOGRAM, URETEROSCOPY AND STENT PLACEMENT;  Surgeon: Alexis Frock, MD;  Location: [facility];  Service: Urology;  Laterality: Bilateral;   HOLMIUM LASER APPLICATION Bilateral  03/04/2535   Procedure: HOLMIUM LASER APPLICATION;  Surgeon: Alexis Frock, MD;  Location: [facility];  Service: Urology;  Laterality: Bilateral;   kidney stone removal     06/2011-stent placement   LEFT MEDIAL FEMORAL CONDYLE DEBRIDEMENT & DRILLING/ REMOVAL LOOSE BODY  09-15-2003   NASAL SEPTUM SURGERY  1985   STONE EXTRACTION WITH BASKET  06/12/2011   Procedure: STONE EXTRACTION WITH BASKET;  Surgeon: Claybon Jabs, MD;  Location: [facility];  Service: Urology;  Laterality: Right;   Patient Active Problem List   Diagnosis Date Noted   Constipation 03/24/2021   Gout 03/23/2021   Primary renal papillary carcinoma, right ([place]) 02/18/2020   Hepatic steatosis 02/18/2020   Major depression in remission ([place]) 11/11/2019   Central sleep apnea due to Cheyne-Stokes respiration 08/31/2019   Hyperlipidemia associated with type 2 diabetes mellitus ([place]) 07/09/2019   Aortic atherosclerosis ([place]) by CXR on 06/10/2018 06/10/2018   Type 2 diabetes mellitus with stage 3a chronic kidney disease, without long-term current use of insulin ([place]) 01/21/2018   OSA treated with BiPAP 01/03/2017   REM sleep behavior disorder 12/06/2016   CKD stage 3 due to type 2 diabetes mellitus ([place]) 10/23/2016   Medication management 10/23/2016   Vitamin D deficiency  10/23/2016   Parkinson's disease ([place]) 01/04/2016   Seasonal and perennial allergic rhinitis 06/21/2014   Asthma, mild intermittent, well-controlled 06/21/2014   Pseudomonas aeruginosa colonization 10/08/2012   CAD (coronary artery disease) 11/15/2011   Essential hypertension 11/15/2011   Morbid obesity with BMI of 40.0-44.9, adult ([place]) 11/15/2011    ONSET DATE: 08/25/21 (referral date)   REFERRING DIAG: Parkinson's Disease   THERAPY DIAG:  Other lack of coordination  Other symptoms and signs involving the nervous system  Other symptoms and signs involving the musculoskeletal system  Abnormal posture  Unsteadiness on  feet  Tremor  Other abnormalities of gait and mobility   PERTINENT HISTORY: Parkinson's Disease, CAD, HTN, HDL, obesity, asthma, DM, CKD, vitamin D deficiency, REM sleep disorder, OSA, aortic atherosclerosis, hx of gout, GERD, hx of anxiety   PRECAUTIONS: None   SUBJECTIVE:   Pt reports that his referral to Dr. Doristine Devoid office was declined  PAIN:  Are you having pain? No   OBJECTIVE:   TODAY'S TREATMENT:    PWR! Moves (basic 4) in quadruped x 10 each with min cues For incr movement amplitude.  Closed-chain shoulder flex floor>overhead in standing, then diagonals to each side in standing with min cueing for large amplitude movements  Checked STGs and discussed progress.--see below (min cueing given for large amplitude movements during assessment)  Standing, functional step and toss beanbags to target with each hand with min cueing for large amplitude movements.  Sliding cards across table with BUEs simultaneously using PWR! Hands for bilateral hand coordination and large amplitude movements with hands.     PATIENT EDUCATION: Education details: reviewed thumb circles (in/around cup for cueing for large amplitude movements) Person educated:  patient Education method:  explanation, demo, v.c. Education comprehension:  verbalized understanding, returned demo     HOME EXERCISE PROGRAM: Pt continues to perform previous HEP with cueing for large amplitude movements.  09/26/21:  Thumb circles   CLINICAL IMPRESSION: Pt is progressing towards goals and responds well to cueing for large amplitude movements; however, pt demo incr difficulty with timed tasks/when focused on speed.     PERFORMANCE DEFICITS in functional skills including ADLs, IADLs, coordination, dexterity, tone, ROM, FMC, GMC, balance, and UE functional use, cognitive skills including  n/a , and psychosocial skills including habits and routines and behaviors.    IMPAIRMENTS are limiting patient from ADLs, IADLs, and  leisure.    COMORBIDITIES may have co-morbidities  that affects occupational performance. Patient will benefit from skilled OT to address above impairments and improve overall function.   MODIFICATION OR ASSISTANCE TO COMPLETE EVALUATION: Min-Moderate modification of tasks or assist with assess necessary to complete an evaluation.   OT OCCUPATIONAL PROFILE AND HISTORY: Detailed assessment: Review of records and additional review of physical, cognitive, psychosocial history related to current functional performance.   CLINICAL DECISION MAKING: Moderate - several treatment options, min-mod task modification necessary   REHAB POTENTIAL: Good   EVALUATION COMPLEXITY: Moderate       GOALS: Potential Goals reviewed with patient? Yes    SHORT TERM GOALS: Target date: 09/27/2021   Pt will be independent with updated PD-specific HEP. Goal status: ONGOING   2.  Pt will improve bilateral coordination to be able to fasten/unfasten 3 buttons in 34sec or less. Baseline:  50, 38.06sec Goal status: 09/29/21:  67.19, 55.25sec   3.  Pt will be able to write at least 3 sentences with only min decr in size.  Goal status: ONGOING  09/29/21:  min-mod decr in  size, 100% legibility   4.  Pt will improve R hand coordination for ADLs/IADLs as shown by completing 9-hole peg test in 49sec or less. Baseline:  53.94sec Goal status: ONGOING  09/29/21:  55.62sec, 46.94sec     LONG TERM GOALS: Target date: 10/25/2021   Pt will verbalize understanding of strategies for ADLs/IADLs to incr ease, incr safety and decr risk of future complications (including strategies for eating, writing, shaving, reaching).  Goal status: INITIAL   2.   Pt will improve coordination/functional reaching with RUE as shown by improving score on box and blocks test by at least 3. Baseline:  43 blocks Goal status: INITIAL   3.  Pt will improve L hand coordination for ADLs/IADLs as shown by completing 9-hole peg test in 39sec or  less. Baseline: 43sec Goal status: INITIAL   4.  Pt will improve R hand coordination for ADLs/IADLs as shown by completing 9-hole peg test in 45sec or less. Baseline:  53.94sec Goal status: INITIAL   5.  Pt will improve coordination/functional reaching with LUE as shown by improving score on box and blocks test by at least 3. Baseline:  42 blocks Goal status: INITIAL     PLAN: OT FREQUENCY: 2x/week   OT DURATION: 8 weeks or 16 visits over 12 weeks due to scheduling +eval   PLANNED INTERVENTIONS: self care/ADL training, therapeutic exercise, therapeutic activity, neuromuscular re-education, manual therapy, manual lymph drainage, passive range of motion, balance training, functional mobility training, aquatic therapy, splinting, electrical stimulation, ultrasound, paraffin, fluidotherapy, moist heat, cryotherapy, patient/family education, energy conservation, coping strategies training, and DME and/or AE instructions   RECOMMENDED OTHER SERVICES: current with ST, just completed PT at [facility]   CONSULTED AND AGREED [place]: Patient   PLAN FOR NEXT SESSION:   continue with functional reaching, coordination, large amplitude movements, bilateral hand coordination, UE stretching, ADL strategies       Vianne Bulls, OTR/L [facility] [address] [place], [place]  [address] [phone] phone [phone] 09/29/21 8:28 AM

## 2021-09-26 NOTE — Patient Instructions (Signed)
Thumb Circles    Move right thumb in a circle.  Move as BIG AS YOU CAN. Repeat 10 times each direction per session. Do 1x/day Hand Variation: Palm up, can perform in a cup

## 2021-09-27 ENCOUNTER — Encounter: Payer: Medicare Other | Admitting: Occupational Therapy

## 2021-09-29 ENCOUNTER — Ambulatory Visit: Payer: Medicare Other | Admitting: Occupational Therapy

## 2021-09-29 DIAGNOSIS — R2689 Other abnormalities of gait and mobility: Secondary | ICD-10-CM | POA: Diagnosis not present

## 2021-09-29 DIAGNOSIS — R2681 Unsteadiness on feet: Secondary | ICD-10-CM

## 2021-09-29 DIAGNOSIS — R471 Dysarthria and anarthria: Secondary | ICD-10-CM | POA: Diagnosis not present

## 2021-09-29 DIAGNOSIS — R29818 Other symptoms and signs involving the nervous system: Secondary | ICD-10-CM | POA: Diagnosis not present

## 2021-09-29 DIAGNOSIS — R29898 Other symptoms and signs involving the musculoskeletal system: Secondary | ICD-10-CM

## 2021-09-29 DIAGNOSIS — R251 Tremor, unspecified: Secondary | ICD-10-CM

## 2021-09-29 DIAGNOSIS — R1310 Dysphagia, unspecified: Secondary | ICD-10-CM | POA: Diagnosis not present

## 2021-09-29 DIAGNOSIS — R293 Abnormal posture: Secondary | ICD-10-CM

## 2021-09-29 DIAGNOSIS — R278 Other lack of coordination: Secondary | ICD-10-CM | POA: Diagnosis not present

## 2021-09-29 NOTE — Therapy (Signed)
OUTPATIENT OCCUPATIONAL THERAPY TREATMENT NOTE   Patient Name: Todd Rice MRN: [id number] DOB:09/04/1955, 66 y.o., male Today's Date: 10/04/2021  PCP: Unk Pinto, MD  REFERRING PROVIDER: Ernestine Mcmurray, NP   END OF SESSION:   OT End of Session - 10/04/21 1058     Visit Number 8    Number of Visits 17    Date for OT Re-Evaluation 11/28/21    Authorization Type UHC Medicare, follows Medicare guidelines    Authorization - Number of Visits 8    Progress Note Due on Visit 10    OT Start Time 1100    OT Stop Time 1145    OT Time Calculation (min) 45 min    Activity Tolerance Patient tolerated treatment well    Behavior During Therapy WFL for tasks assessed/performed                   Past Medical History:  Diagnosis Date   Adult BMI 50.0-59.9 kg/sq m    52.77   Anal fissure    Anxiety    Diabetes mellitus without complication (HCC)    borderline   Elevated PSA 08/13/2019   GERD (gastroesophageal reflux disease)    30 years ago, maybe from peptic ulcer   H/O: gout    STABLE   Hepatic steatosis 10/01/11   History of kidney stones    Hyperlipidemia    Hypertension    Kidney tumor    right upper kidney tumor, monitoring   OSA on CPAP    [employer] [facility]   Parkinson disease ([place]) 01/04/2016   REM sleep behavior disorder 12/06/2016   Right ureteral stone    Ureteral calculus, right 06/12/2011   Vitamin D deficiency    Weakness    Past Surgical History:  Procedure Laterality Date   ANAL FISSURE REPAIR  10-12-2000   COLONOSCOPY N/A 07/01/2012   Procedure: COLONOSCOPY;  Surgeon: Lafayette Dragon, MD;  [facility];  Service: Endoscopy;  Laterality: N/A;   CYSTOSCOPY WITH RETROGRADE PYELOGRAM, URETEROSCOPY AND STENT PLACEMENT Bilateral 02/26/2013   Procedure: CYSTOSCOPY WITH RETROGRADE PYELOGRAM, URETEROSCOPY AND STENT PLACEMENT;  Surgeon: Alexis Frock, MD;  Location[facility];  Service: Urology;  Laterality: Bilateral;   HOLMIUM LASER APPLICATION  Bilateral 51/88/4166   Procedure: HOLMIUM LASER APPLICATION;  Surgeon: Alexis Frock, MD;  Location: [facility];  Service: Urology;  Laterality: Bilateral;   kidney stone removal     06/2011-stent placement   LEFT MEDIAL FEMORAL CONDYLE DEBRIDEMENT & DRILLING/ REMOVAL LOOSE BODY  09-15-2003   NASAL SEPTUM SURGERY  1985   STONE EXTRACTION WITH BASKET  06/12/2011   Procedure: STONE EXTRACTION WITH BASKET;  Surgeon: Claybon Jabs, MD;  Location: [facility];  Service: Urology;  Laterality: Right;   Patient Active Problem List   Diagnosis Date Noted   Constipation 03/24/2021   Gout 03/23/2021   Primary renal papillary carcinoma, right ([place]) 02/18/2020   Hepatic steatosis 02/18/2020   Major depression in remission ([place]) 11/11/2019   Central sleep apnea due to Cheyne-Stokes respiration 08/31/2019   Hyperlipidemia associated with type 2 diabetes mellitus ([place]) 07/09/2019   Aortic atherosclerosis ([place]) by CXR on 06/10/2018 06/10/2018   Type 2 diabetes mellitus with stage 3a chronic kidney disease, without long-term current use of insulin ([place]) 01/21/2018   OSA treated with BiPAP 01/03/2017   REM sleep behavior disorder 12/06/2016   CKD stage 3 due to type 2 diabetes mellitus ([place]) 10/23/2016   Medication management 10/23/2016   Vitamin D  deficiency 10/23/2016   Parkinson's disease ([place]) 01/04/2016   Seasonal and perennial allergic rhinitis 06/21/2014   Asthma, mild intermittent, well-controlled 06/21/2014   Pseudomonas aeruginosa colonization 10/08/2012   CAD (coronary artery disease) 11/15/2011   Essential hypertension 11/15/2011   Morbid obesity with BMI of 40.0-44.9, adult ([place]) 11/15/2011    ONSET DATE: 08/25/21 (referral date)   REFERRING DIAG: Parkinson's Disease   THERAPY DIAG:  Other lack of coordination  Other symptoms and signs involving the nervous system  Other symptoms and signs involving the musculoskeletal system  Abnormal posture  Unsteadiness on  feet  Tremor   PERTINENT HISTORY: Parkinson's Disease, CAD, HTN, HDL, obesity, asthma, DM, CKD, vitamin D deficiency, REM sleep disorder, OSA, aortic atherosclerosis, hx of gout, GERD, hx of anxiety, tumor on R kidney  PRECAUTIONS: None   SUBJECTIVE:   Pt reports that he has follow-up for kidney next week   PAIN:  Are you having pain? No   OBJECTIVE:   TODAY'S TREATMENT:    PWR! Moves (up, rock, twist) in modified quadruped x 10 each with min cues For incr movement amplitude.  Standing, functional reach with RUE with min cueing for large amplitude movements for wt. Shift/trunk rotation to flip cards with emphasis on finger/elbow ext and posture.  Stringing beads with min cueing to use R hand as dominant and L hand to stabilize with min cueing for large amplitude for bilateral hand coordination.  Practiced writing:  sentences, shapes, letters, spirals, zig-zags with min cueing for incr size.  Trial of tan foam grip with incr ease/size (issued for home) and more upright pen position.  Also practiced stabilizing forearm and incr wrist movement with writing.      PATIENT EDUCATION: Education details: use of foam grip for writing, use of more upright pen position Person educated:   pt Education method:  demo, explanation Education comprehension:   verbalized understanding   HOME EXERCISE PROGRAM: Pt continues to perform previous HEP with cueing for large amplitude movements.  09/26/21:  Thumb circles  10/04/21:  foam grip for pen, more upright pen position   CLINICAL IMPRESSION: Pt is progressing towards goals and responds well to cueing for large amplitude movements.  However, pt needs consistent min cueing for specific tasks such as handwriting.     PERFORMANCE DEFICITS in functional skills including ADLs, IADLs, coordination, dexterity, tone, ROM, FMC, GMC, balance, and UE functional use, cognitive skills including  n/a , and psychosocial skills including habits and  routines and behaviors.    IMPAIRMENTS are limiting patient from ADLs, IADLs, and leisure.    COMORBIDITIES may have co-morbidities  that affects occupational performance. Patient will benefit from skilled OT to address above impairments and improve overall function.   MODIFICATION OR ASSISTANCE TO COMPLETE EVALUATION: Min-Moderate modification of tasks or assist with assess necessary to complete an evaluation.   OT OCCUPATIONAL PROFILE AND HISTORY: Detailed assessment: Review of records and additional review of physical, cognitive, psychosocial history related to current functional performance.   CLINICAL DECISION MAKING: Moderate - several treatment options, min-mod task modification necessary   REHAB POTENTIAL: Good   EVALUATION COMPLEXITY: Moderate       GOALS: Potential Goals reviewed with patient? Yes    SHORT TERM GOALS: Target date: 09/27/2021   Pt will be independent with updated PD-specific HEP. Goal status: ONGOING   2.  Pt will improve bilateral coordination to be able to fasten/unfasten 3 buttons in 34sec or less. Baseline:  50, 38.06sec Goal status: 09/29/21:  67.19,  55.25sec   3.  Pt will be able to write at least 3 sentences with only min decr in size.  Goal status: ONGOING  09/29/21:  min-mod decr in size, 100% legibility   4.  Pt will improve R hand coordination for ADLs/IADLs as shown by completing 9-hole peg test in 49sec or less. Baseline:  53.94sec Goal status: ONGOING  09/29/21:  55.62sec, 46.94sec     LONG TERM GOALS: Target date: 10/25/2021   Pt will verbalize understanding of strategies for ADLs/IADLs to incr ease, incr safety and decr risk of future complications (including strategies for eating, writing, shaving, reaching).  Goal status: INITIAL   2.   Pt will improve coordination/functional reaching with RUE as shown by improving score on box and blocks test by at least 3. Baseline:  43 blocks Goal status: INITIAL   3.  Pt will improve L hand  coordination for ADLs/IADLs as shown by completing 9-hole peg test in 39sec or less. Baseline: 43sec Goal status: INITIAL   4.  Pt will improve R hand coordination for ADLs/IADLs as shown by completing 9-hole peg test in 45sec or less. Baseline:  53.94sec Goal status: INITIAL   5.  Pt will improve coordination/functional reaching with LUE as shown by improving score on box and blocks test by at least 3. Baseline:  42 blocks Goal status: INITIAL     PLAN: OT FREQUENCY: 2x/week   OT DURATION: 8 weeks or 16 visits over 12 weeks due to scheduling +eval   PLANNED INTERVENTIONS: self care/ADL training, therapeutic exercise, therapeutic activity, neuromuscular re-education, manual therapy, manual lymph drainage, passive range of motion, balance training, functional mobility training, aquatic therapy, splinting, electrical stimulation, ultrasound, paraffin, fluidotherapy, moist heat, cryotherapy, patient/family education, energy conservation, coping strategies training, and DME and/or AE instructions   RECOMMENDED OTHER SERVICES: current with ST, just completed PT at [facility]   CONSULTED AND AGREED [place]: Patient   PLAN FOR NEXT SESSION:    continue with functional reaching, coordination, large amplitude movements, bilateral hand coordination, UE stretching, ADL strategies       Vianne Bulls, OTR/L [facility] [address] [place], [place]  [address] [phone] phone [phone] 10/04/21 12:07 PM

## 2021-10-04 ENCOUNTER — Encounter: Payer: Self-pay | Admitting: Occupational Therapy

## 2021-10-04 ENCOUNTER — Ambulatory Visit: Payer: Medicare Other | Admitting: Occupational Therapy

## 2021-10-04 DIAGNOSIS — R278 Other lack of coordination: Secondary | ICD-10-CM

## 2021-10-04 DIAGNOSIS — R2681 Unsteadiness on feet: Secondary | ICD-10-CM | POA: Diagnosis not present

## 2021-10-04 DIAGNOSIS — K8020 Calculus of gallbladder without cholecystitis without obstruction: Secondary | ICD-10-CM | POA: Diagnosis not present

## 2021-10-04 DIAGNOSIS — R1310 Dysphagia, unspecified: Secondary | ICD-10-CM | POA: Diagnosis not present

## 2021-10-04 DIAGNOSIS — R29898 Other symptoms and signs involving the musculoskeletal system: Secondary | ICD-10-CM

## 2021-10-04 DIAGNOSIS — R2689 Other abnormalities of gait and mobility: Secondary | ICD-10-CM | POA: Diagnosis not present

## 2021-10-04 DIAGNOSIS — R29818 Other symptoms and signs involving the nervous system: Secondary | ICD-10-CM

## 2021-10-04 DIAGNOSIS — R293 Abnormal posture: Secondary | ICD-10-CM | POA: Diagnosis not present

## 2021-10-04 DIAGNOSIS — N289 Disorder of kidney and ureter, unspecified: Secondary | ICD-10-CM | POA: Diagnosis not present

## 2021-10-04 DIAGNOSIS — N200 Calculus of kidney: Secondary | ICD-10-CM | POA: Diagnosis not present

## 2021-10-04 DIAGNOSIS — R251 Tremor, unspecified: Secondary | ICD-10-CM

## 2021-10-04 DIAGNOSIS — R471 Dysarthria and anarthria: Secondary | ICD-10-CM | POA: Diagnosis not present

## 2021-10-04 DIAGNOSIS — K760 Fatty (change of) liver, not elsewhere classified: Secondary | ICD-10-CM | POA: Diagnosis not present

## 2021-10-04 DIAGNOSIS — D4101 Neoplasm of uncertain behavior of right kidney: Secondary | ICD-10-CM | POA: Diagnosis not present

## 2021-10-06 ENCOUNTER — Encounter: Payer: Self-pay | Admitting: Occupational Therapy

## 2021-10-06 ENCOUNTER — Ambulatory Visit: Payer: Medicare Other | Attending: Adult Health | Admitting: Occupational Therapy

## 2021-10-06 DIAGNOSIS — R29818 Other symptoms and signs involving the nervous system: Secondary | ICD-10-CM | POA: Insufficient documentation

## 2021-10-06 DIAGNOSIS — R278 Other lack of coordination: Secondary | ICD-10-CM | POA: Insufficient documentation

## 2021-10-06 DIAGNOSIS — R2681 Unsteadiness on feet: Secondary | ICD-10-CM | POA: Diagnosis not present

## 2021-10-06 DIAGNOSIS — R293 Abnormal posture: Secondary | ICD-10-CM | POA: Diagnosis not present

## 2021-10-06 DIAGNOSIS — R251 Tremor, unspecified: Secondary | ICD-10-CM | POA: Diagnosis not present

## 2021-10-06 DIAGNOSIS — R2689 Other abnormalities of gait and mobility: Secondary | ICD-10-CM | POA: Diagnosis not present

## 2021-10-06 DIAGNOSIS — R29898 Other symptoms and signs involving the musculoskeletal system: Secondary | ICD-10-CM | POA: Diagnosis not present

## 2021-10-06 NOTE — Therapy (Signed)
OUTPATIENT OCCUPATIONAL THERAPY TREATMENT NOTE   Patient Name: Todd Rice MRN: [id number] DOB:1955-11-12, 66 y.o., male Today's Date: 10/06/2021  PCP: Unk Pinto, MD  REFERRING PROVIDER: Ernestine Mcmurray, NP   END OF SESSION:   OT End of Session - 10/06/21 0729     Visit Number 9    Number of Visits 17    Date for OT Re-Evaluation 11/28/21    Authorization Type UHC Medicare, follows Medicare guidelines    Authorization - Number of Visits 9    Progress Note Due on Visit 10    OT Start Time 0720    OT Stop Time 0800    OT Time Calculation (min) 40 min    Activity Tolerance Patient tolerated treatment well    Behavior During Therapy WFL for tasks assessed/performed                   Past Medical History:  Diagnosis Date   Adult BMI 50.0-59.9 kg/sq m    52.77   Anal fissure    Anxiety    Diabetes mellitus without complication (HCC)    borderline   Elevated PSA 08/13/2019   GERD (gastroesophageal reflux disease)    30 years ago, maybe from peptic ulcer   H/O: gout    STABLE   Hepatic steatosis 10/01/11   History of kidney stones    Hyperlipidemia    Hypertension    Kidney tumor    right upper kidney tumor, monitoring   OSA on CPAP    [employer] [facility]   Parkinson disease ([place]) 01/04/2016   REM sleep behavior disorder 12/06/2016   Right ureteral stone    Ureteral calculus, right 06/12/2011   Vitamin D deficiency    Weakness    Past Surgical History:  Procedure Laterality Date   ANAL FISSURE REPAIR  10-12-2000   COLONOSCOPY N/A 07/01/2012   Procedure: COLONOSCOPY;  Surgeon: Lafayette Dragon, MD;  [facility]: [facility];  Service: Endoscopy;  Laterality: N/A;   CYSTOSCOPY WITH RETROGRADE PYELOGRAM, URETEROSCOPY AND STENT PLACEMENT Bilateral 02/26/2013   Procedure: CYSTOSCOPY WITH RETROGRADE PYELOGRAM, URETEROSCOPY AND STENT PLACEMENT;  Surgeon: Alexis Frock, MD;  Location: [facility];  Service: Urology;  Laterality: Bilateral;   HOLMIUM LASER APPLICATION  Bilateral 26/83/4196   Procedure: HOLMIUM LASER APPLICATION;  Surgeon: Alexis Frock, MD;  Location: [facility];  Service: Urology;  Laterality: Bilateral;   kidney stone removal     06/2011-stent placement   LEFT MEDIAL FEMORAL CONDYLE DEBRIDEMENT & DRILLING/ REMOVAL LOOSE BODY  09-15-2003   NASAL SEPTUM SURGERY  1985   STONE EXTRACTION WITH BASKET  06/12/2011   Procedure: STONE EXTRACTION WITH BASKET;  Surgeon: Claybon Jabs, MD;  Location[facility];  Service: Urology;  Laterality: Right;   Patient Active Problem List   Diagnosis Date Noted   Constipation 03/24/2021   Gout 03/23/2021   Primary renal papillary carcinoma, right ([place]) 02/18/2020   Hepatic steatosis 02/18/2020   Major depression in remission ([place]) 11/11/2019   Central sleep apnea due to Cheyne-Stokes respiration 08/31/2019   Hyperlipidemia associated with type 2 diabetes mellitus ([place]) 07/09/2019   Aortic atherosclerosis ([place]) by CXR on 06/10/2018 06/10/2018   Type 2 diabetes mellitus with stage 3a chronic kidney disease, without long-term current use of insulin ([place]) 01/21/2018   OSA treated with BiPAP 01/03/2017   REM sleep behavior disorder 12/06/2016   CKD stage 3 due to type 2 diabetes mellitus ([place]) 10/23/2016   Medication management 10/23/2016   Vitamin D  deficiency 10/23/2016   Parkinson's disease ([place]) 01/04/2016   Seasonal and perennial allergic rhinitis 06/21/2014   Asthma, mild intermittent, well-controlled 06/21/2014   Pseudomonas aeruginosa colonization 10/08/2012   CAD (coronary artery disease) 11/15/2011   Essential hypertension 11/15/2011   Morbid obesity with BMI of 40.0-44.9, adult ([place]) 11/15/2011    ONSET DATE: 08/25/21 (referral date)   REFERRING DIAG: Parkinson's Disease   THERAPY DIAG:  Other symptoms and signs involving the nervous system  Other lack of coordination  Other symptoms and signs involving the musculoskeletal system  Abnormal posture  Unsteadiness on  feet  Tremor   PERTINENT HISTORY: Parkinson's Disease, CAD, HTN, HDL, obesity, asthma, DM, CKD, vitamin D deficiency, REM sleep disorder, OSA, aortic atherosclerosis, hx of gout, GERD, hx of anxiety, tumor on R kidney  PRECAUTIONS: None   SUBJECTIVE:   didn't sleep well last night  PAIN:  Are you having pain? No   OBJECTIVE:   TODAY'S TREATMENT:    Standing, closed-chain shoulder flex (floor>overhead) and diagonals to each side with trunk rotation with min cueing for large amplitude.   PWR! Hands to slide cards across table with both hands simultaneously for large amplitude and bilateral hand coordination/timing   Functional step and reach forward/back to manipulate 3 pegs in each hand and placed in vertical pegboard with min cueing/set-up for large amplitude movements.  Practiced fastening/unfastening buttons with min cueing for use of strategies/incr movement amplitude.  Practiced putting tops on/off bottle/jar with min cueing for large amplitude movement strategies.  Dealing cards with each hand with min cueing for incr movement amplitude (min difficulty L hand, min-mod difficulty R hand).     PATIENT EDUCATION: Education details:  n/a today Person educated:    Education method:  Education comprehension:      HOME EXERCISE PROGRAM: Pt continues to perform previous HEP with cueing for large amplitude movements.  (Reviewed coordination, finger ROM, PWR! Moves in supine, quadruped) 09/26/21:  Thumb circles  10/04/21:  foam grip for pen, more upright pen position   CLINICAL IMPRESSION: Pt is progressing towards goals and demo improved in-hand manipulation and bilateral hand coordination today.   PERFORMANCE DEFICITS in functional skills including ADLs, IADLs, coordination, dexterity, tone, ROM, FMC, GMC, balance, and UE functional use, cognitive skills including  n/a , and psychosocial skills including habits and routines and behaviors.    IMPAIRMENTS are limiting  patient from ADLs, IADLs, and leisure.    COMORBIDITIES may have co-morbidities  that affects occupational performance. Patient will benefit from skilled OT to address above impairments and improve overall function.   MODIFICATION OR ASSISTANCE TO COMPLETE EVALUATION: Min-Moderate modification of tasks or assist with assess necessary to complete an evaluation.   OT OCCUPATIONAL PROFILE AND HISTORY: Detailed assessment: Review of records and additional review of physical, cognitive, psychosocial history related to current functional performance.   CLINICAL DECISION MAKING: Moderate - several treatment options, min-mod task modification necessary   REHAB POTENTIAL: Good   EVALUATION COMPLEXITY: Moderate       GOALS: Potential Goals reviewed with patient? Yes    SHORT TERM GOALS: Target date: 09/27/2021   Pt will be independent with updated PD-specific HEP. Goal status: ONGOING   2.  Pt will improve bilateral coordination to be able to fasten/unfasten 3 buttons in 34sec or less. Baseline:  50, 38.06sec Goal status: 09/29/21:  67.19, 55.25sec   3.  Pt will be able to write at least 3 sentences with only min decr in size.  Goal status: ONGOING  09/29/21:  min-mod decr in size, 100% legibility   4.  Pt will improve R hand coordination for ADLs/IADLs as shown by completing 9-hole peg test in 49sec or less. Baseline:  53.94sec Goal status: ONGOING  09/29/21:  55.62sec, 46.94sec     LONG TERM GOALS: Target date: 10/25/2021   Pt will verbalize understanding of strategies for ADLs/IADLs to incr ease, incr safety and decr risk of future complications (including strategies for eating, writing, shaving, reaching).  Goal status: INITIAL   2.   Pt will improve coordination/functional reaching with RUE as shown by improving score on box and blocks test by at least 3. Baseline:  43 blocks Goal status: INITIAL   3.  Pt will improve L hand coordination for ADLs/IADLs as shown by completing 9-hole  peg test in 39sec or less. Baseline: 43sec Goal status: INITIAL   4.  Pt will improve R hand coordination for ADLs/IADLs as shown by completing 9-hole peg test in 45sec or less. Baseline:  53.94sec Goal status: INITIAL   5.  Pt will improve coordination/functional reaching with LUE as shown by improving score on box and blocks test by at least 3. Baseline:  42 blocks Goal status: INITIAL     PLAN: OT FREQUENCY: 2x/week   OT DURATION: 8 weeks or 16 visits over 12 weeks due to scheduling +eval   PLANNED INTERVENTIONS: self care/ADL training, therapeutic exercise, therapeutic activity, neuromuscular re-education, manual therapy, manual lymph drainage, passive range of motion, balance training, functional mobility training, aquatic therapy, splinting, electrical stimulation, ultrasound, paraffin, fluidotherapy, moist heat, cryotherapy, patient/family education, energy conservation, coping strategies training, and DME and/or AE instructions   RECOMMENDED OTHER SERVICES: current with ST, just completed PT at [facility]   CONSULTED AND AGREED WITH PLAN OF CARE: Patient   PLAN FOR NEXT SESSION:    **progress note next visit; continue with functional reaching, coordination, large amplitude movements, bilateral hand coordination, UE stretching, ADL strategies       Vianne Bulls, OTR/L [facility] [address] [place], [place]  [address] [phone] phone [phone] 10/06/21 8:08 AM

## 2021-10-09 DIAGNOSIS — G4733 Obstructive sleep apnea (adult) (pediatric): Secondary | ICD-10-CM | POA: Diagnosis not present

## 2021-10-10 NOTE — Therapy (Signed)
OUTPATIENT OCCUPATIONAL THERAPY TREATMENT NOTE   Patient Name: Todd Rice MRN: [id number] DOB:Jun 11, 1955, 66 y.o., male Today's Date: 10/11/2021  PCP: Unk Pinto, MD  REFERRING PROVIDER: Ernestine Mcmurray, NP   END OF SESSION:   OT End of Session - 10/11/21 0932     Visit Number 10    Number of Visits 17    Date for OT Re-Evaluation 11/28/21    Authorization Type UHC Medicare, follows Medicare guidelines    Authorization - Number of Visits 10    Progress Note Due on Visit 10    OT Start Time 0933    OT Stop Time 1015    OT Time Calculation (min) 42 min    Activity Tolerance Patient tolerated treatment well    Behavior During Therapy WFL for tasks assessed/performed                    Past Medical History:  Diagnosis Date   Adult BMI 50.0-59.9 kg/sq m    52.77   Anal fissure    Anxiety    Diabetes mellitus without complication (HCC)    borderline   Elevated PSA 08/13/2019   GERD (gastroesophageal reflux disease)    30 years ago, maybe from peptic ulcer   H/O: gout    STABLE   Hepatic steatosis 10/01/11   History of kidney stones    Hyperlipidemia    Hypertension    Kidney tumor    right upper kidney tumor, monitoring   OSA on CPAP    [employer] [facility]   Parkinson disease ([place]) 01/04/2016   REM sleep behavior disorder 12/06/2016   Right ureteral stone    Ureteral calculus, right 06/12/2011   Vitamin D deficiency    Weakness    Past Surgical History:  Procedure Laterality Date   ANAL FISSURE REPAIR  10-12-2000   COLONOSCOPY N/A 07/01/2012   Procedure: COLONOSCOPY;  Surgeon: Lafayette Dragon, MD;  [facility];  Service: Endoscopy;  Laterality: N/A;   CYSTOSCOPY WITH RETROGRADE PYELOGRAM, URETEROSCOPY AND STENT PLACEMENT Bilateral 02/26/2013   Procedure: CYSTOSCOPY WITH RETROGRADE PYELOGRAM, URETEROSCOPY AND STENT PLACEMENT;  Surgeon: Alexis Frock, MD;  [facility];  Service: Urology;  Laterality: Bilateral;   HOLMIUM LASER APPLICATION  Bilateral 13/24/4010   Procedure: HOLMIUM LASER APPLICATION;  Surgeon: Alexis Frock, MD;  [facility];  Service: Urology;  Laterality: Bilateral;   kidney stone removal     06/2011-stent placement   LEFT MEDIAL FEMORAL CONDYLE DEBRIDEMENT & DRILLING/ REMOVAL LOOSE BODY  09-15-2003   NASAL SEPTUM SURGERY  1985   STONE EXTRACTION WITH BASKET  06/12/2011   Procedure: STONE EXTRACTION WITH BASKET;  Surgeon: Claybon Jabs, MD;  Location: [facility];  Service: Urology;  Laterality: Right;   Patient Active Problem List   Diagnosis Date Noted   Constipation 03/24/2021   Gout 03/23/2021   Primary renal papillary carcinoma, right ([place]) 02/18/2020   Hepatic steatosis 02/18/2020   Major depression in remission ([place]) 11/11/2019   Central sleep apnea due to Cheyne-Stokes respiration 08/31/2019   Hyperlipidemia associated with type 2 diabetes mellitus ([place]) 07/09/2019   Aortic atherosclerosis ([place]) by CXR on 06/10/2018 06/10/2018   Type 2 diabetes mellitus with stage 3a chronic kidney disease, without long-term current use of insulin ([place]) 01/21/2018   OSA treated with BiPAP 01/03/2017   REM sleep behavior disorder 12/06/2016   CKD stage 3 due to type 2 diabetes mellitus ([place]) 10/23/2016   Medication management 10/23/2016   Vitamin  D deficiency 10/23/2016   Parkinson's disease ([place]) 01/04/2016   Seasonal and perennial allergic rhinitis 06/21/2014   Asthma, mild intermittent, well-controlled 06/21/2014   Pseudomonas aeruginosa colonization 10/08/2012   CAD (coronary artery disease) 11/15/2011   Essential hypertension 11/15/2011   Morbid obesity with BMI of 40.0-44.9, adult ([place]) 11/15/2011    ONSET DATE: 08/25/21 (referral date)   REFERRING DIAG: Parkinson's Disease   THERAPY DIAG:  Other symptoms and signs involving the nervous system  Other lack of coordination  Other symptoms and signs involving the musculoskeletal system  Abnormal posture  Unsteadiness on  feet  Tremor   PERTINENT HISTORY: Parkinson's Disease, CAD, HTN, HDL, obesity, asthma, DM, CKD, vitamin D deficiency, REM sleep disorder, OSA, aortic atherosclerosis, hx of gout, GERD, hx of anxiety, tumor on R kidney  PRECAUTIONS: None   SUBJECTIVE:   Pt reports that his kidney tumor has grown and he will need to have it removed within a year.  Pt just found out this morning and is adjusting to news.    PAIN:  Are you having pain? No   OBJECTIVE:   TODAY'S TREATMENT:   Standing, functional reach to flip cards with each UE for trunk rotation and wt. Shift with min cueing.    Picking up coins with each finger/thumb to stack with R hand with min difficulty/cues for large amplitude movements.  Dealing cards with each hand with min cueing for incr movement amplitude (min difficulty L hand, mod difficulty R hand).    PATIENT EDUCATION: Education details:  Aware and Care Kit Info; discussed diagnosis and upcoming kidney surgery (within a year) Person educated:   Patient Education method:   Consulting civil engineer, Demo Education comprehension:    Verbalized Understanding   HOME EXERCISE PROGRAM: Pt continues to perform previous HEP with cueing for large amplitude movements.  (Reviewed coordination, finger ROM, PWR! Moves in supine, quadruped) 09/26/21:  Thumb circles  10/04/21:  foam grip for pen, more upright pen position   CLINICAL IMPRESSION: Progress note reporting period 08/30/21-10/11/21:  Pt is progressing towards goals with improving coordination, but has some difficulty at times with speed/consistency.   Pt would benefit from continued occupational therapy for incr consistency for calibration of movement, to incr ease/safety with ADLs/IADLs, and improved RUE functional use.   PERFORMANCE DEFICITS in functional skills including ADLs, IADLs, coordination, dexterity, tone, ROM, FMC, GMC, balance, and UE functional use, cognitive skills including  n/a , and psychosocial skills including  habits and routines and behaviors.    IMPAIRMENTS are limiting patient from ADLs, IADLs, and leisure.    COMORBIDITIES may have co-morbidities  that affects occupational performance. Patient will benefit from skilled OT to address above impairments and improve overall function.   MODIFICATION OR ASSISTANCE TO COMPLETE EVALUATION: Min-Moderate modification of tasks or assist with assess necessary to complete an evaluation.   OT OCCUPATIONAL PROFILE AND HISTORY: Detailed assessment: Review of records and additional review of physical, cognitive, psychosocial history related to current functional performance.   CLINICAL DECISION MAKING: Moderate - several treatment options, min-mod task modification necessary   REHAB POTENTIAL: Good   EVALUATION COMPLEXITY: Moderate       GOALS: Potential Goals reviewed with patient? Yes    SHORT TERM GOALS: Target date: 09/27/2021   Pt will be independent with updated PD-specific HEP. Goal status: ONGOING   2.  Pt will improve bilateral coordination to be able to fasten/unfasten 3 buttons in 34sec or less. Baseline:  50, 38.06sec Goal status: 09/29/21:  67.19, 55.25sec  3.  Pt will be able to write at least 3 sentences with only min decr in size.  Goal status: ONGOING  09/29/21:  min-mod decr in size, 100% legibility   4.  Pt will improve R hand coordination for ADLs/IADLs as shown by completing 9-hole peg test in 49sec or less. Baseline:  53.94sec Goal status: ONGOING  09/29/21:  55.62sec, 46.94sec     LONG TERM GOALS: Target date: 10/25/2021   Pt will verbalize understanding of strategies for ADLs/IADLs to incr ease, incr safety and decr risk of future complications (including strategies for eating, writing, shaving, reaching).  Goal status: ONGOING 10/11/21   2.   Pt will improve coordination/functional reaching with RUE as shown by improving score on box and blocks test by at least 3. Baseline:  43 blocks Goal status: INITIAL   3.  Pt will  improve L hand coordination for ADLs/IADLs as shown by completing 9-hole peg test in 39sec or less. Baseline: 43sec Goal status: INITIAL   4.  Pt will improve R hand coordination for ADLs/IADLs as shown by completing 9-hole peg test in 45sec or less. Baseline:  53.94sec Goal status: INITIAL   5.  Pt will improve coordination/functional reaching with LUE as shown by improving score on box and blocks test by at least 3. Baseline:  42 blocks Goal status: INITIAL     PLAN: OT FREQUENCY: 2x/week   OT DURATION: 8 weeks or 16 visits over 12 weeks due to scheduling +eval   PLANNED INTERVENTIONS: self care/ADL training, therapeutic exercise, therapeutic activity, neuromuscular re-education, manual therapy, manual lymph drainage, passive range of motion, balance training, functional mobility training, aquatic therapy, splinting, electrical stimulation, ultrasound, paraffin, fluidotherapy, moist heat, cryotherapy, patient/family education, energy conservation, coping strategies training, and DME and/or AE instructions   RECOMMENDED OTHER SERVICES: current with ST, just completed PT at [facility]   CONSULTED AND AGREED [place]: Patient   PLAN FOR NEXT SESSION:    continue with functional reaching, coordination, large amplitude movements, bilateral hand coordination, UE stretching, ADL strategies         Vianne Bulls, OTR/L [facility] [address] [place], [place]  [address] [phone] phone [phone] 10/11/21 12:15 PM

## 2021-10-11 ENCOUNTER — Encounter: Payer: Self-pay | Admitting: Occupational Therapy

## 2021-10-11 ENCOUNTER — Ambulatory Visit: Payer: Medicare Other | Admitting: Occupational Therapy

## 2021-10-11 DIAGNOSIS — R251 Tremor, unspecified: Secondary | ICD-10-CM

## 2021-10-11 DIAGNOSIS — R278 Other lack of coordination: Secondary | ICD-10-CM | POA: Diagnosis not present

## 2021-10-11 DIAGNOSIS — R2681 Unsteadiness on feet: Secondary | ICD-10-CM | POA: Diagnosis not present

## 2021-10-11 DIAGNOSIS — D4101 Neoplasm of uncertain behavior of right kidney: Secondary | ICD-10-CM | POA: Diagnosis not present

## 2021-10-11 DIAGNOSIS — N200 Calculus of kidney: Secondary | ICD-10-CM | POA: Diagnosis not present

## 2021-10-11 DIAGNOSIS — R29898 Other symptoms and signs involving the musculoskeletal system: Secondary | ICD-10-CM

## 2021-10-11 DIAGNOSIS — R29818 Other symptoms and signs involving the nervous system: Secondary | ICD-10-CM | POA: Diagnosis not present

## 2021-10-11 DIAGNOSIS — R2689 Other abnormalities of gait and mobility: Secondary | ICD-10-CM | POA: Diagnosis not present

## 2021-10-11 DIAGNOSIS — R293 Abnormal posture: Secondary | ICD-10-CM

## 2021-10-11 DIAGNOSIS — N3020 Other chronic cystitis without hematuria: Secondary | ICD-10-CM | POA: Diagnosis not present

## 2021-10-11 DIAGNOSIS — G4733 Obstructive sleep apnea (adult) (pediatric): Secondary | ICD-10-CM | POA: Diagnosis not present

## 2021-10-11 NOTE — Therapy (Signed)
OUTPATIENT OCCUPATIONAL THERAPY TREATMENT NOTE   Patient Name: Todd Rice MRN: [id number] DOB:11-Oct-1955, 66 y.o., male Today's Date: 10/13/2021  PCP: Unk Pinto, MD  REFERRING PROVIDER: Ernestine Mcmurray, NP   END OF SESSION:   OT End of Session - 10/13/21 0722     Visit Number 11    Number of Visits 17    Date for OT Re-Evaluation 11/28/21    Authorization Type UHC Medicare, follows Medicare guidelines    Authorization - Number of Visits 11    Progress Note Due on Visit 10    OT Start Time 0722    OT Stop Time 0800    OT Time Calculation (min) 38 min    Activity Tolerance Patient tolerated treatment well    Behavior During Therapy WFL for tasks assessed/performed                     Past Medical History:  Diagnosis Date   Adult BMI 50.0-59.9 kg/sq m    52.77   Anal fissure    Anxiety    Diabetes mellitus without complication (HCC)    borderline   Elevated PSA 08/13/2019   GERD (gastroesophageal reflux disease)    30 years ago, maybe from peptic ulcer   H/O: gout    STABLE   Hepatic steatosis 10/01/11   History of kidney stones    Hyperlipidemia    Hypertension    Kidney tumor    right upper kidney tumor, monitoring   OSA on CPAP    [employer] [facility]   Parkinson disease ([place]) 01/04/2016   REM sleep behavior disorder 12/06/2016   Right ureteral stone    Ureteral calculus, right 06/12/2011   Vitamin D deficiency    Weakness    Past Surgical History:  Procedure Laterality Date   ANAL FISSURE REPAIR  10-12-2000   COLONOSCOPY N/A 07/01/2012   Procedure: COLONOSCOPY;  Surgeon: Lafayette Dragon, MD;  Location: [facility];  Service: Endoscopy;  Laterality: N/A;   CYSTOSCOPY WITH RETROGRADE PYELOGRAM, URETEROSCOPY AND STENT PLACEMENT Bilateral 02/26/2013   Procedure: CYSTOSCOPY WITH RETROGRADE PYELOGRAM, URETEROSCOPY AND STENT PLACEMENT;  Surgeon: Alexis Frock, MD;  Location: [facility];  Service: Urology;  Laterality: Bilateral;   HOLMIUM LASER APPLICATION  Bilateral 07/20/9456   Procedure: HOLMIUM LASER APPLICATION;  Surgeon: Alexis Frock, MD;  Location: [facility];  Service: Urology;  Laterality: Bilateral;   kidney stone removal     06/2011-stent placement   LEFT MEDIAL FEMORAL CONDYLE DEBRIDEMENT & DRILLING/ REMOVAL LOOSE BODY  09-15-2003   NASAL SEPTUM SURGERY  1985   STONE EXTRACTION WITH BASKET  06/12/2011   Procedure: STONE EXTRACTION WITH BASKET;  Surgeon: Claybon Jabs, MD;  Location[facility];  Service: Urology;  Laterality: Right;   Patient Active Problem List   Diagnosis Date Noted   Constipation 03/24/2021   Gout 03/23/2021   Primary renal papillary carcinoma, right ([place]) 02/18/2020   Hepatic steatosis 02/18/2020   Major depression in remission ([place]) 11/11/2019   Central sleep apnea due to Cheyne-Stokes respiration 08/31/2019   Hyperlipidemia associated with type 2 diabetes mellitus ([place]) 07/09/2019   Aortic atherosclerosis ([place]) by CXR on 06/10/2018 06/10/2018   Type 2 diabetes mellitus with stage 3a chronic kidney disease, without long-term current use of insulin ([place]) 01/21/2018   OSA treated with BiPAP 01/03/2017   REM sleep behavior disorder 12/06/2016   CKD stage 3 due to type 2 diabetes mellitus ([place]) 10/23/2016   Medication management 10/23/2016  Vitamin D deficiency 10/23/2016   Parkinson's disease ([place]) 01/04/2016   Seasonal and perennial allergic rhinitis 06/21/2014   Asthma, mild intermittent, well-controlled 06/21/2014   Pseudomonas aeruginosa colonization 10/08/2012   CAD (coronary artery disease) 11/15/2011   Essential hypertension 11/15/2011   Morbid obesity with BMI of 40.0-44.9, adult ([place]) 11/15/2011    ONSET DATE: 08/25/21 (referral date)   REFERRING DIAG: Parkinson's Disease   THERAPY DIAG:  Other symptoms and signs involving the nervous system  Other lack of coordination  Other symptoms and signs involving the musculoskeletal system  Abnormal posture  Unsteadiness on  feet  Tremor   PERTINENT HISTORY: Parkinson's Disease, CAD, HTN, HDL, obesity, asthma, DM, CKD, vitamin D deficiency, REM sleep disorder, OSA, aortic atherosclerosis, hx of gout, GERD, hx of anxiety, tumor on R kidney  PRECAUTIONS: None   SUBJECTIVE:   doing ok   PAIN:  Are you having pain? No   OBJECTIVE:   TODAY'S TREATMENT:   PWR! Moves in modified quadruped (up, rock, twist) with min cueing for large amplitude movements.   Sliding cards across table with PWR! Hands with both hands simultaneously for bilateral coordination/timing.   Practiced fastening/unfastening buttons with min cueing for large amplitude movement strategies x2.   Practiced writing with emphasis/min cues for large amplitude movements (with foam grip):  circles, zigzags with good size/spacing.  Then writing 4 sentences with good legibility and no significant decr in size.  Checked status towards goals--see below.    Isolated IP flex bilaterally, thumb circles with min v.c. and improvement noted    PATIENT EDUCATION: Education details:  n/a   Person educated:    Education method:    Education comprehension:       HOME EXERCISE PROGRAM: Pt continues to perform previous HEP with cueing for large amplitude movements.  (Reviewed coordination, finger ROM, PWR! Moves in supine, quadruped) 09/26/21:  Thumb circles  10/04/21:  foam grip for pen, more upright pen position       GOALS: Potential Goals reviewed with patient? Yes    SHORT TERM GOALS: Target date: 09/27/2021   Pt will be independent with updated PD-specific HEP. Goal status: MET   2.  Pt will improve bilateral coordination to be able to fasten/unfasten 3 buttons in 34sec or less. Baseline:  50, 38.06sec Goal status: 09/29/21:  67.19, 55.25sec.  10/13/21:  37.19sec   3.  Pt will be able to write at least 3 sentences with only min decr in size.  Goal status:   09/29/21:  min-mod decr in size, 100% legibility.  10/13/21:  MET 10/13/21   4.  Pt  will improve R hand coordination for ADLs/IADLs as shown by completing 9-hole peg test in 49sec or less. Baseline:  53.94sec Goal status:  09/29/21:  55.62sec, 46.94sec.  10/13/21:  36.75sec     LONG TERM GOALS: Target date: 10/25/2021   Pt will verbalize understanding of strategies for ADLs/IADLs to incr ease, incr safety and decr risk of future complications (including strategies for eating, writing, shaving, reaching).  Goal status: ONGOING 10/11/21   2.   Pt will improve coordination/functional reaching with RUE as shown by improving score on box and blocks test by at least 3. Baseline:  43 blocks Goal status: INITIAL   3.  Pt will improve L hand coordination for ADLs/IADLs as shown by completing 9-hole peg test in 39sec or less. Baseline: 43sec Goal status: INITIAL   4.  Pt will improve R hand coordination for ADLs/IADLs as shown by completing  9-hole peg test in 45sec or less. Baseline:  53.94sec Goal status: INITIAL   5.  Pt will improve coordination/functional reaching with LUE as shown by improving score on box and blocks test by at least 3. Baseline:  42 blocks Goal status: INITIAL     PLAN:  CLINICAL IMPRESSION: Pt is progressing towards goals with improving coordination and decr rigidity, but has some difficulty at times with speed/consistency.     PERFORMANCE DEFICITS in functional skills including ADLs, IADLs, coordination, dexterity, tone, ROM, FMC, GMC, balance, and UE functional use, cognitive skills including  n/a , and psychosocial skills including habits and routines and behaviors.    IMPAIRMENTS are limiting patient from ADLs, IADLs, and leisure.    COMORBIDITIES may have co-morbidities  that affects occupational performance. Patient will benefit from skilled OT to address above impairments and improve overall function.   MODIFICATION OR ASSISTANCE TO COMPLETE EVALUATION: Min-Moderate modification of tasks or assist with assess necessary to complete an  evaluation.   OT OCCUPATIONAL PROFILE AND HISTORY: Detailed assessment: Review of records and additional review of physical, cognitive, psychosocial history related to current functional performance.   CLINICAL DECISION MAKING: Moderate - several treatment options, min-mod task modification necessary   REHAB POTENTIAL: Good   EVALUATION COMPLEXITY: Moderate  OT FREQUENCY: 2x/week   OT DURATION: 8 weeks or 16 visits over 12 weeks due to scheduling +eval   PLANNED INTERVENTIONS: self care/ADL training, therapeutic exercise, therapeutic activity, neuromuscular re-education, manual therapy, manual lymph drainage, passive range of motion, balance training, functional mobility training, aquatic therapy, splinting, electrical stimulation, ultrasound, paraffin, fluidotherapy, moist heat, cryotherapy, patient/family education, energy conservation, coping strategies training, and DME and/or AE instructions   RECOMMENDED OTHER SERVICES: current with ST, just completed PT at [facility]   CONSULTED AND AGREED [place]: Patient   PLAN FOR NEXT SESSION:    continue with functional reaching, coordination, large amplitude movements, bilateral hand coordination, UE stretching, ADL strategies         Vianne Bulls, OTR/L [facility] [address] [place], [place]  [address] [phone] phone [phone] 10/13/21 7:30 AM

## 2021-10-13 ENCOUNTER — Encounter: Payer: Self-pay | Admitting: Occupational Therapy

## 2021-10-13 ENCOUNTER — Ambulatory Visit: Payer: Medicare Other | Admitting: Occupational Therapy

## 2021-10-13 DIAGNOSIS — R251 Tremor, unspecified: Secondary | ICD-10-CM

## 2021-10-13 DIAGNOSIS — R293 Abnormal posture: Secondary | ICD-10-CM

## 2021-10-13 DIAGNOSIS — R29818 Other symptoms and signs involving the nervous system: Secondary | ICD-10-CM

## 2021-10-13 DIAGNOSIS — R278 Other lack of coordination: Secondary | ICD-10-CM

## 2021-10-13 DIAGNOSIS — R2681 Unsteadiness on feet: Secondary | ICD-10-CM

## 2021-10-13 DIAGNOSIS — R29898 Other symptoms and signs involving the musculoskeletal system: Secondary | ICD-10-CM

## 2021-10-13 DIAGNOSIS — R2689 Other abnormalities of gait and mobility: Secondary | ICD-10-CM | POA: Diagnosis not present

## 2021-10-17 NOTE — Therapy (Signed)
OUTPATIENT OCCUPATIONAL THERAPY TREATMENT NOTE   Patient Name: Todd Rice MRN: [id number] DOB:07-21-1955, 66 y.o., male Today's Date: 10/18/2021  PCP: Lucky Cowboy, MD  REFERRING PROVIDER: Renelda Loma, NP   END OF SESSION:   OT End of Session - 10/18/21 0728     Visit Number 12    Number of Visits 17    Date for OT Re-Evaluation 11/28/21    Authorization Type UHC Medicare, follows Medicare guidelines    Authorization - Number of Visits 12    Progress Note Due on Visit 10    OT Start Time 0722    OT Stop Time 0800    OT Time Calculation (min) 38 min    Activity Tolerance Patient tolerated treatment well    Behavior During Therapy WFL for tasks assessed/performed                      Past Medical History:  Diagnosis Date   Adult BMI 50.0-59.9 kg/sq m    52.77   Anal fissure    Anxiety    Diabetes mellitus without complication (HCC)    borderline   Elevated PSA 08/13/2019   GERD (gastroesophageal reflux disease)    30 years ago, maybe from peptic ulcer   H/O: gout    STABLE   Hepatic steatosis 10/01/11   History of kidney stones    Hyperlipidemia    Hypertension    Kidney tumor    right upper kidney tumor, monitoring   OSA on CPAP    [employer] [facility]   Parkinson disease (HCC) 01/04/2016   REM sleep behavior disorder 12/06/2016   Right ureteral stone    Ureteral calculus, right 06/12/2011   Vitamin D deficiency    Weakness    Past Surgical History:  Procedure Laterality Date   ANAL FISSURE REPAIR  10-12-2000   COLONOSCOPY N/A 07/01/2012   Procedure: COLONOSCOPY;  Surgeon: Hart Carwin, MD;  [facility];  Service: Endoscopy;  Laterality: N/A;   CYSTOSCOPY WITH RETROGRADE PYELOGRAM, URETEROSCOPY AND STENT PLACEMENT Bilateral 02/26/2013   Procedure: CYSTOSCOPY WITH RETROGRADE PYELOGRAM, URETEROSCOPY AND STENT PLACEMENT;  Surgeon: Sebastian Ache, MD;  Location: [facility];  Service: Urology;  Laterality: Bilateral;   HOLMIUM LASER APPLICATION  Bilateral 02/26/2013   Procedure: HOLMIUM LASER APPLICATION;  Surgeon: Sebastian Ache, MD;  Location: [facility];  Service: Urology;  Laterality: Bilateral;   kidney stone removal     06/2011-stent placement   LEFT MEDIAL FEMORAL CONDYLE DEBRIDEMENT & DRILLING/ REMOVAL LOOSE BODY  09-15-2003   NASAL SEPTUM SURGERY  1985   STONE EXTRACTION WITH BASKET  06/12/2011   Procedure: STONE EXTRACTION WITH BASKET;  Surgeon: Garnett Farm, MD;  Location[facility];  Service: Urology;  Laterality: Right;   Patient Active Problem List   Diagnosis Date Noted   Constipation 03/24/2021   Gout 03/23/2021   Primary renal papillary carcinoma, 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   Hepatic steatosis 02/18/2020   Major depression in remission (HCC) 11/11/2019   Central sleep apnea due to Cheyne-Stokes respiration 08/31/2019   Hyperlipidemia associated with type 2 diabetes mellitus (HCC) 07/09/2019   Aortic atherosclerosis (HCC) by CXR on 06/10/2018 06/10/2018   Type 2 diabetes mellitus with stage 3a chronic kidney disease, without long-term current use of insulin (HCC) 01/21/2018   OSA treated with BiPAP 01/03/2017   REM sleep behavior disorder 12/06/2016   CKD stage 3 due to type 2 diabetes mellitus (HCC) 10/23/2016   Medication management 10/23/2016  Vitamin D deficiency 10/23/2016   Parkinson's disease ([place]) 01/04/2016   Seasonal and perennial allergic rhinitis 06/21/2014   Asthma, mild intermittent, well-controlled 06/21/2014   Pseudomonas aeruginosa colonization 10/08/2012   CAD (coronary artery disease) 11/15/2011   Essential hypertension 11/15/2011   Morbid obesity with BMI of 40.0-44.9, adult ([place]) 11/15/2011    ONSET DATE: 08/25/21 (referral date)   REFERRING DIAG: Parkinson's Disease   THERAPY DIAG:  Other symptoms and signs involving the nervous system  Other lack of coordination  Other symptoms and signs involving the musculoskeletal system  Abnormal posture  Unsteadiness on  feet  Tremor   PERTINENT HISTORY: Parkinson's Disease, CAD, HTN, HDL, obesity, asthma, DM, CKD, vitamin D deficiency, REM sleep disorder, OSA, aortic atherosclerosis, hx of gout, GERD, hx of anxiety, tumor on R kidney  PRECAUTIONS: None   SUBJECTIVE:   doing ok   PAIN:  Are you having pain? No   OBJECTIVE:   TODAY'S TREATMENT:   Closed-chain shoulder flex with ball floor>overhead followed by diagonals to each side with emphasis on large amplitude movements.     Standing, functional reaching with trunk rotation/lateral wt. Shift (reaching to floor) with set-up for large amplitude with min cueing.    Completing [place] Pegboard with both hand with min difficulty for incr coordination, min v.c. for large amplitude movements and then removing.   Simulated ADLs with bag with focus/min cues for large amplitude movements:  donning/doffing pants, pulling bag into hand for clothing adjustment/donning socks        PATIENT EDUCATION: Education details:  Reviewed stop-restart big strategy for functional tasks if pt experiences hesitation with movement   Person educated:   pt Education method:   explanation Education comprehension:    verbalized understanding, returned demo   HOME EXERCISE PROGRAM: Pt continues to perform previous HEP with cueing for large amplitude movements.  (Reviewed coordination, finger ROM, PWR! Moves in supine, quadruped) 09/26/21:  Thumb circles  10/04/21:  foam grip for pen, more upright pen position       GOALS: Potential Goals reviewed with patient? Yes    SHORT TERM GOALS: Target date: 09/27/2021   Pt will be independent with updated PD-specific HEP. Goal status: MET   2.  Pt will improve bilateral coordination to be able to fasten/unfasten 3 buttons in 34sec or less. Baseline:  50, 38.06sec Goal status: 09/29/21:  67.19, 55.25sec.  10/13/21:  37.19sec   3.  Pt will be able to write at least 3 sentences with only min decr in size.  Goal status:    09/29/21:  min-mod decr in size, 100% legibility.  10/13/21:  MET 10/13/21   4.  Pt will improve R hand coordination for ADLs/IADLs as shown by completing 9-hole peg test in 49sec or less. Baseline:  53.94sec Goal status:  09/29/21:  55.62sec, 46.94sec.  10/13/21:  36.75sec     LONG TERM GOALS: Target date: 10/25/2021   Pt will verbalize understanding of strategies for ADLs/IADLs to incr ease, incr safety and decr risk of future complications (including strategies for eating, writing, shaving, reaching).  Goal status: ONGOING 10/11/21   2.   Pt will improve coordination/functional reaching with RUE as shown by improving score on box and blocks test by at least 3. Baseline:  43 blocks Goal status: INITIAL   3.  Pt will improve L hand coordination for ADLs/IADLs as shown by completing 9-hole peg test in 39sec or less. Baseline: 43sec Goal status: INITIAL   4.  Pt will improve R  hand coordination for ADLs/IADLs as shown by completing 9-hole peg test in 45sec or less. Baseline:  53.94sec Goal status: INITIAL   5.  Pt will improve coordination/functional reaching with LUE as shown by improving score on box and blocks test by at least 3. Baseline:  42 blocks Goal status: INITIAL     PLAN:  CLINICAL IMPRESSION: Pt is progressing towards goals with improved coordination, however, pt continues to have hesitation with some coordination tasks at times.     PERFORMANCE DEFICITS in functional skills including ADLs, IADLs, coordination, dexterity, tone, ROM, FMC, GMC, balance, and UE functional use, cognitive skills including  n/a , and psychosocial skills including habits and routines and behaviors.    IMPAIRMENTS are limiting patient from ADLs, IADLs, and leisure.    COMORBIDITIES may have co-morbidities  that affects occupational performance. Patient will benefit from skilled OT to address above impairments and improve overall function.   MODIFICATION OR ASSISTANCE TO COMPLETE EVALUATION:  Min-Moderate modification of tasks or assist with assess necessary to complete an evaluation.   OT OCCUPATIONAL PROFILE AND HISTORY: Detailed assessment: Review of records and additional review of physical, cognitive, psychosocial history related to current functional performance.   CLINICAL DECISION MAKING: Moderate - several treatment options, min-mod task modification necessary   REHAB POTENTIAL: Good   EVALUATION COMPLEXITY: Moderate  OT FREQUENCY: 2x/week   OT DURATION: 8 weeks or 16 visits over 12 weeks due to scheduling +eval   PLANNED INTERVENTIONS: self care/ADL training, therapeutic exercise, therapeutic activity, neuromuscular re-education, manual therapy, manual lymph drainage, passive range of motion, balance training, functional mobility training, aquatic therapy, splinting, electrical stimulation, ultrasound, paraffin, fluidotherapy, moist heat, cryotherapy, patient/family education, energy conservation, coping strategies training, and DME and/or AE instructions   RECOMMENDED OTHER SERVICES: current with ST, just completed PT at [facility]   CONSULTED AND AGREED [place]: Patient   PLAN FOR NEXT SESSION:    continue with functional reaching, coordination, large amplitude movements, bilateral hand coordination, UE stretching, ADL strategies         Vianne Bulls, OTR/L [facility] [address] [place], [place]  [address] [phone] phone [phone] 10/18/21 7:35 AM

## 2021-10-18 ENCOUNTER — Ambulatory Visit: Payer: Medicare Other | Admitting: Occupational Therapy

## 2021-10-18 ENCOUNTER — Other Ambulatory Visit (HOSPITAL_BASED_OUTPATIENT_CLINIC_OR_DEPARTMENT_OTHER): Payer: Self-pay

## 2021-10-18 ENCOUNTER — Encounter: Payer: Self-pay | Admitting: Occupational Therapy

## 2021-10-18 DIAGNOSIS — R29818 Other symptoms and signs involving the nervous system: Secondary | ICD-10-CM

## 2021-10-18 DIAGNOSIS — R2681 Unsteadiness on feet: Secondary | ICD-10-CM | POA: Diagnosis not present

## 2021-10-18 DIAGNOSIS — R293 Abnormal posture: Secondary | ICD-10-CM

## 2021-10-18 DIAGNOSIS — R29898 Other symptoms and signs involving the musculoskeletal system: Secondary | ICD-10-CM

## 2021-10-18 DIAGNOSIS — R2689 Other abnormalities of gait and mobility: Secondary | ICD-10-CM | POA: Diagnosis not present

## 2021-10-18 DIAGNOSIS — R278 Other lack of coordination: Secondary | ICD-10-CM | POA: Diagnosis not present

## 2021-10-18 DIAGNOSIS — R251 Tremor, unspecified: Secondary | ICD-10-CM | POA: Diagnosis not present

## 2021-10-20 ENCOUNTER — Ambulatory Visit: Payer: Medicare Other | Admitting: Occupational Therapy

## 2021-10-20 ENCOUNTER — Encounter: Payer: Self-pay | Admitting: Occupational Therapy

## 2021-10-20 DIAGNOSIS — R2689 Other abnormalities of gait and mobility: Secondary | ICD-10-CM

## 2021-10-20 DIAGNOSIS — R278 Other lack of coordination: Secondary | ICD-10-CM | POA: Diagnosis not present

## 2021-10-20 DIAGNOSIS — R251 Tremor, unspecified: Secondary | ICD-10-CM

## 2021-10-20 DIAGNOSIS — R29898 Other symptoms and signs involving the musculoskeletal system: Secondary | ICD-10-CM | POA: Diagnosis not present

## 2021-10-20 DIAGNOSIS — R29818 Other symptoms and signs involving the nervous system: Secondary | ICD-10-CM

## 2021-10-20 DIAGNOSIS — R293 Abnormal posture: Secondary | ICD-10-CM

## 2021-10-20 DIAGNOSIS — R2681 Unsteadiness on feet: Secondary | ICD-10-CM | POA: Diagnosis not present

## 2021-10-20 NOTE — Therapy (Signed)
OUTPATIENT OCCUPATIONAL THERAPY TREATMENT NOTE   Patient Name: Todd Rice MRN: [id number] DOB:[birth year], 66 y.o., male Today's Date: 10/20/2021  PCP: Unk Pinto, MD  REFERRING PROVIDER: Ernestine Mcmurray, NP   END OF SESSION:   OT End of Session - 10/20/21 0719     Visit Number 13    Number of Visits 17    Date for OT Re-Evaluation 11/28/21    Authorization Type UHC Medicare, follows Medicare guidelines    Authorization - Number of Visits 13    Progress Note Due on Visit 10    OT Start Time [phone]    OT Stop Time 0800    OT Time Calculation (min) 42 min    Activity Tolerance Patient tolerated treatment well    Behavior During Therapy WFL for tasks assessed/performed                      Past Medical History:  Diagnosis Date   Adult BMI 50.0-59.9 kg/sq m    52.77   Anal fissure    Anxiety    Diabetes mellitus without complication (HCC)    borderline   Elevated PSA 08/13/2019   GERD (gastroesophageal reflux disease)    30 years ago, maybe from peptic ulcer   H/O: gout    STABLE   Hepatic steatosis 10/01/11   History of kidney stones    Hyperlipidemia    Hypertension    Kidney tumor    right upper kidney tumor, monitoring   OSA on CPAP    [employer] [facility]   Parkinson disease ([place]) 01/04/2016   REM sleep behavior disorder 12/06/2016   Right ureteral stone    Ureteral calculus, right 06/12/2011   Vitamin D deficiency    Weakness    Past Surgical History:  Procedure Laterality Date   ANAL FISSURE REPAIR  10-12-2000   COLONOSCOPY N/A 07/01/2012   Procedure: COLONOSCOPY;  Surgeon: Lafayette Dragon, MD;  Location: [facility];  Service: Endoscopy;  Laterality: N/A;   CYSTOSCOPY WITH RETROGRADE PYELOGRAM, URETEROSCOPY AND STENT PLACEMENT Bilateral 02/26/2013   Procedure: CYSTOSCOPY WITH RETROGRADE PYELOGRAM, URETEROSCOPY AND STENT PLACEMENT;  Surgeon: Alexis Frock, MD;  Location: [facility];  Service: Urology;  Laterality: Bilateral;   HOLMIUM LASER APPLICATION  Bilateral 11/91/4782   Procedure: HOLMIUM LASER APPLICATION;  Surgeon: Alexis Frock, MD;  Location: [facility];  Service: Urology;  Laterality: Bilateral;   kidney stone removal     06/2011-stent placement   LEFT MEDIAL FEMORAL CONDYLE DEBRIDEMENT & DRILLING/ REMOVAL LOOSE BODY  09-15-2003   NASAL SEPTUM SURGERY  1985   STONE EXTRACTION WITH BASKET  06/12/2011   Procedure: STONE EXTRACTION WITH BASKET;  Surgeon: Claybon Jabs, MD;  Location: [facility];  Service: Urology;  Laterality: Right;   Patient Active Problem List   Diagnosis Date Noted   Constipation 03/24/2021   Gout 03/23/2021   Primary renal papillary carcinoma, right ([place]) 02/18/2020   Hepatic steatosis 02/18/2020   Major depression in remission ([place]) 11/11/2019   Central sleep apnea due to Cheyne-Stokes respiration 08/31/2019   Hyperlipidemia associated with type 2 diabetes mellitus ([place]) 07/09/2019   Aortic atherosclerosis ([place]) by CXR on 06/10/2018 06/10/2018   Type 2 diabetes mellitus with stage 3a chronic kidney disease, without long-term current use of insulin ([place]) 01/21/2018   OSA treated with BiPAP 01/03/2017   REM sleep behavior disorder 12/06/2016   CKD stage 3 due to type 2 diabetes mellitus ([place]) 10/23/2016   Medication management 10/23/2016  Vitamin D deficiency 10/23/2016   Parkinson's disease ([place]) 01/04/2016   Seasonal and perennial allergic rhinitis 06/21/2014   Asthma, mild intermittent, well-controlled 06/21/2014   Pseudomonas aeruginosa colonization 10/08/2012   CAD (coronary artery disease) 11/15/2011   Essential hypertension 11/15/2011   Morbid obesity with BMI of 40.0-44.9, adult ([place]) 11/15/2011    ONSET DATE: 08/25/21 (referral date)   REFERRING DIAG: Parkinson's Disease   THERAPY DIAG:  Other symptoms and signs involving the nervous system  Other lack of coordination  Other symptoms and signs involving the musculoskeletal system  Abnormal posture  Unsteadiness on  feet  Tremor  Other abnormalities of gait and mobility   PERTINENT HISTORY: Parkinson's Disease, CAD, HTN, HDL, obesity, asthma, DM, CKD, vitamin D deficiency, REM sleep disorder, OSA, aortic atherosclerosis, hx of gout, GERD, hx of anxiety, tumor on R kidney  PRECAUTIONS: None   SUBJECTIVE:   kidney appt next Tues to go ahead and discuss going forward with kidney surgery  PAIN:  Are you having pain? No   OBJECTIVE:   TODAY'S TREATMENT:    Closed-chain shoulder flex with ball floor>overhead followed by diagonals to each side with emphasis on large amplitude movements.    PWR! Moves (basic 4) in standing x 20 each with min cues For incr movement amplitude.    Placing small pegs in pegboard with each finger with each hand with min cueing for large amplitude movements.   Pulling bag into hand with each hand with large amplitude movements with min difficulty/cues.        PATIENT EDUCATION: Education details:  n/a  Person educated:    IT sales professional: Education comprehension:      HOME EXERCISE PROGRAM: Pt continues to perform previous HEP with cueing for large amplitude movements.  (Reviewed coordination, finger ROM, PWR! Moves in supine, quadruped) 09/26/21:  Thumb circles  10/04/21:  foam grip for pen, more upright pen position       GOALS: Potential Goals reviewed with patient? Yes    SHORT TERM GOALS: Target date: 09/27/2021   Pt will be independent with updated PD-specific HEP. Goal status: MET   2.  Pt will improve bilateral coordination to be able to fasten/unfasten 3 buttons in 34sec or less. Baseline:  50, 38.06sec Goal status: 09/29/21:  67.19, 55.25sec.  10/13/21:  37.19sec   3.  Pt will be able to write at least 3 sentences with only min decr in size.  Goal status:   09/29/21:  min-mod decr in size, 100% legibility.  10/13/21:  MET 10/13/21   4.  Pt will improve R hand coordination for ADLs/IADLs as shown by completing 9-hole peg test in 49sec or  less. Baseline:  53.94sec Goal status:  09/29/21:  55.62sec, 46.94sec.  10/13/21:  36.75sec     LONG TERM GOALS: Target date: 10/25/2021   Pt will verbalize understanding of strategies for ADLs/IADLs to incr ease, incr safety and decr risk of future complications (including strategies for eating, writing, shaving, reaching).  Goal status: ONGOING 10/11/21   2.   Pt will improve coordination/functional reaching with RUE as shown by improving score on box and blocks test by at least 3. Baseline:  43 blocks Goal status: INITIAL   3.  Pt will improve L hand coordination for ADLs/IADLs as shown by completing 9-hole peg test in 39sec or less. Baseline: 43sec Goal status: INITIAL   4.  Pt will improve R hand coordination for ADLs/IADLs as shown by completing 9-hole peg test in 45sec or less. Baseline:  53.94sec Goal status: INITIAL   5.  Pt will improve coordination/functional reaching with LUE as shown by improving score on box and blocks test by at least 3. Baseline:  42 blocks Goal status: INITIAL     PLAN:  CLINICAL IMPRESSION: Pt is progressing towards goal with improving coordination and functional movement patterns.  PERFORMANCE DEFICITS in functional skills including ADLs, IADLs, coordination, dexterity, tone, ROM, FMC, GMC, balance, and UE functional use, cognitive skills including  n/a , and psychosocial skills including habits and routines and behaviors.    IMPAIRMENTS are limiting patient from ADLs, IADLs, and leisure.    COMORBIDITIES may have co-morbidities  that affects occupational performance. Patient will benefit from skilled OT to address above impairments and improve overall function.   MODIFICATION OR ASSISTANCE TO COMPLETE EVALUATION: Min-Moderate modification of tasks or assist with assess necessary to complete an evaluation.   OT OCCUPATIONAL PROFILE AND HISTORY: Detailed assessment: Review of records and additional review of physical, cognitive, psychosocial  history related to current functional performance.   CLINICAL DECISION MAKING: Moderate - several treatment options, min-mod task modification necessary   REHAB POTENTIAL: Good   EVALUATION COMPLEXITY: Moderate  OT FREQUENCY: 2x/week   OT DURATION: 8 weeks or 16 visits over 12 weeks due to scheduling +eval   PLANNED INTERVENTIONS: self care/ADL training, therapeutic exercise, therapeutic activity, neuromuscular re-education, manual therapy, manual lymph drainage, passive range of motion, balance training, functional mobility training, aquatic therapy, splinting, electrical stimulation, ultrasound, paraffin, fluidotherapy, moist heat, cryotherapy, patient/family education, energy conservation, coping strategies training, and DME and/or AE instructions   RECOMMENDED OTHER SERVICES: current with ST, just completed PT at [facility]   CONSULTED AND AGREED WITH PLAN OF CARE: Patient   PLAN FOR NEXT SESSION:    continue with functional reaching, coordination, large amplitude movements, bilateral hand coordination, UE stretching, ADL strategies (pt not scheduled next week; begin checking goals next visit).       Vianne Bulls, OTR/L [facility] [address] [place], [place]  [address] [phone] phone [phone] 10/20/21 7:21 AM

## 2021-10-25 ENCOUNTER — Encounter: Payer: Medicare Other | Admitting: Occupational Therapy

## 2021-10-25 DIAGNOSIS — N3020 Other chronic cystitis without hematuria: Secondary | ICD-10-CM | POA: Diagnosis not present

## 2021-10-25 DIAGNOSIS — N200 Calculus of kidney: Secondary | ICD-10-CM | POA: Diagnosis not present

## 2021-10-25 DIAGNOSIS — D4101 Neoplasm of uncertain behavior of right kidney: Secondary | ICD-10-CM | POA: Diagnosis not present

## 2021-10-27 ENCOUNTER — Other Ambulatory Visit: Payer: Self-pay | Admitting: Urology

## 2021-10-27 ENCOUNTER — Encounter: Payer: Medicare Other | Admitting: Occupational Therapy

## 2021-10-28 ENCOUNTER — Other Ambulatory Visit (HOSPITAL_BASED_OUTPATIENT_CLINIC_OR_DEPARTMENT_OTHER): Payer: Self-pay

## 2021-11-01 ENCOUNTER — Other Ambulatory Visit: Payer: Self-pay | Admitting: Urology

## 2021-11-01 ENCOUNTER — Ambulatory Visit: Payer: Medicare Other | Admitting: Occupational Therapy

## 2021-11-01 ENCOUNTER — Encounter: Payer: Self-pay | Admitting: Occupational Therapy

## 2021-11-01 DIAGNOSIS — R29818 Other symptoms and signs involving the nervous system: Secondary | ICD-10-CM

## 2021-11-01 DIAGNOSIS — R2681 Unsteadiness on feet: Secondary | ICD-10-CM | POA: Diagnosis not present

## 2021-11-01 DIAGNOSIS — R293 Abnormal posture: Secondary | ICD-10-CM | POA: Diagnosis not present

## 2021-11-01 DIAGNOSIS — R251 Tremor, unspecified: Secondary | ICD-10-CM

## 2021-11-01 DIAGNOSIS — R278 Other lack of coordination: Secondary | ICD-10-CM | POA: Diagnosis not present

## 2021-11-01 DIAGNOSIS — R29898 Other symptoms and signs involving the musculoskeletal system: Secondary | ICD-10-CM

## 2021-11-01 DIAGNOSIS — R2689 Other abnormalities of gait and mobility: Secondary | ICD-10-CM | POA: Diagnosis not present

## 2021-11-03 ENCOUNTER — Ambulatory Visit: Payer: Medicare Other | Admitting: Occupational Therapy

## 2021-11-06 NOTE — Therapy (Addendum)
OUTPATIENT OCCUPATIONAL THERAPY TREATMENT NOTE   Patient Name: Todd Rice MRN: [id number] DOB:[birth year], 66 y.o., male Today's Date: 11/07/2021  PCP: Unk Pinto, MD  REFERRING PROVIDER: Ernestine Mcmurray, NP   END OF SESSION:   OT End of Session - 11/07/21 0720     Visit Number 15    Number of Visits 17    Date for OT Re-Evaluation 11/28/21    Authorization Type UHC Medicare, follows Medicare guidelines    Authorization - Number of Visits 15    Progress Note Due on Visit 15    OT Start Time [phone]    OT Stop Time 0800    OT Time Calculation (min) 42 min    Activity Tolerance Patient tolerated treatment well    Behavior During Therapy WFL for tasks assessed/performed                        Past Medical History:  Diagnosis Date   Adult BMI 50.0-59.9 kg/sq m    52.77   Anal fissure    Anxiety    Diabetes mellitus without complication (HCC)    borderline   Elevated PSA 08/13/2019   GERD (gastroesophageal reflux disease)    30 years ago, maybe from peptic ulcer   H/O: gout    STABLE   Hepatic steatosis 10/01/11   History of kidney stones    Hyperlipidemia    Hypertension    Kidney tumor    right upper kidney tumor, monitoring   OSA on CPAP    [employer] [facility]   Parkinson disease ([place]) 01/04/2016   REM sleep behavior disorder 12/06/2016   Right ureteral stone    Ureteral calculus, right 06/12/2011   Vitamin D deficiency    Weakness    Past Surgical History:  Procedure Laterality Date   ANAL FISSURE REPAIR  10-12-2000   COLONOSCOPY N/A 07/01/2012   Procedure: COLONOSCOPY;  Surgeon: Lafayette Dragon, MD;  [facility];  Service: Endoscopy;  Laterality: N/A;   CYSTOSCOPY WITH RETROGRADE PYELOGRAM, URETEROSCOPY AND STENT PLACEMENT Bilateral 02/26/2013   Procedure: CYSTOSCOPY WITH RETROGRADE PYELOGRAM, URETEROSCOPY AND STENT PLACEMENT;  Surgeon: Alexis Frock, MD;  [facility];  Service: Urology;  Laterality: Bilateral;   HOLMIUM LASER  APPLICATION Bilateral 09/32/6712   Procedure: HOLMIUM LASER APPLICATION;  Surgeon: Alexis Frock, MD;  Location: [facility];  Service: Urology;  Laterality: Bilateral;   kidney stone removal     06/2011-stent placement   LEFT MEDIAL FEMORAL CONDYLE DEBRIDEMENT & DRILLING/ REMOVAL LOOSE BODY  09-15-2003   NASAL SEPTUM SURGERY  1985   STONE EXTRACTION WITH BASKET  06/12/2011   Procedure: STONE EXTRACTION WITH BASKET;  Surgeon: Claybon Jabs, MD;  Location: [facility];  Service: Urology;  Laterality: Right;   Patient Active Problem List   Diagnosis Date Noted   Constipation 03/24/2021   Gout 03/23/2021   Primary renal papillary carcinoma, right ([place]) 02/18/2020   Hepatic steatosis 02/18/2020   Major depression in remission ([place]) 11/11/2019   Central sleep apnea due to Cheyne-Stokes respiration 08/31/2019   Hyperlipidemia associated with type 2 diabetes mellitus ([place]) 07/09/2019   Aortic atherosclerosis ([place]) by CXR on 06/10/2018 06/10/2018   Type 2 diabetes mellitus with stage 3a chronic kidney disease, without long-term current use of insulin ([place]) 01/21/2018   OSA treated with BiPAP 01/03/2017   REM sleep behavior disorder 12/06/2016   CKD stage 3 due to type 2 diabetes mellitus ([place]) 10/23/2016   Medication management  10/23/2016   Vitamin D deficiency 10/23/2016   Parkinson's disease ([place]) 01/04/2016   Seasonal and perennial allergic rhinitis 06/21/2014   Asthma, mild intermittent, well-controlled 06/21/2014   Pseudomonas aeruginosa colonization 10/08/2012   CAD (coronary artery disease) 11/15/2011   Essential hypertension 11/15/2011   Morbid obesity with BMI of 40.0-44.9, adult ([place]) 11/15/2011    ONSET DATE: 08/25/21 (referral date)   REFERRING DIAG: Parkinson's Disease   THERAPY DIAG:  Other symptoms and signs involving the nervous system  Other lack of coordination  Other symptoms and signs involving the musculoskeletal system  Abnormal  posture  Unsteadiness on feet  Tremor   PERTINENT HISTORY: Parkinson's Disease, CAD, HTN, HDL, obesity, asthma, DM, CKD, vitamin D deficiency, REM sleep disorder, OSA, aortic atherosclerosis, hx of gout, GERD, hx of anxiety, tumor on R kidney  PRECAUTIONS: None   SUBJECTIVE:   doing ok   PAIN:  Are you having pain? No   OBJECTIVE:   TODAY'S TREATMENT:   Began checking goals and discussing progress--see below  PWR! Moves (up, rock, twist) in standing/modified quadruped at the wall x 10 each side with min cues For incr movement amplitude.    Sliding cards across table with PWR! Hands (step--with each finger) with each hand alternating all with min v.c. and emphasis on incr movement amplitude.  Flipping card between each finger with min A for R hand and min cueing for L hand.  Pt demo incr difficulty with individual finger movement of 5th digit with R hand.        PATIENT EDUCATION: Education details:  n/a  Person educated:    IT sales professional: Education comprehension:      HOME EXERCISE PROGRAM: Pt continues to perform previous HEP with cueing for large amplitude movements.  (Reviewed coordination, finger ROM, PWR! Moves in supine, quadruped) 09/26/21:  Thumb circles  10/04/21:  foam grip for pen, more upright pen position       GOALS: Potential Goals reviewed with patient? Yes    SHORT TERM GOALS: Target date: 09/27/2021   Pt will be independent with updated PD-specific HEP. Goal status: MET   2.  Pt will improve bilateral coordination to be able to fasten/unfasten 3 buttons in 34sec or less. Baseline:  50, 38.06sec Goal status: 09/29/21:  67.19, 55.25sec.  10/13/21:  37.19sec   3.  Pt will be able to write at least 3 sentences with only min decr in size.  Goal status:   09/29/21:  min-mod decr in size, 100% legibility.  10/13/21:  MET 10/13/21   4.  Pt will improve R hand coordination for ADLs/IADLs as shown by completing 9-hole peg test in 49sec or less. Baseline:   53.94sec Goal status:  MET 09/29/21:  55.62sec, 46.94sec.  10/13/21:  36.75sec     LONG TERM GOALS: Target date: 11/18/21--extended due to decr frequency   Pt will verbalize understanding of strategies for ADLs/IADLs to incr ease, incr safety and decr risk of future complications (including strategies for eating, writing, shaving, reaching).  Goal status: ONGOING 10/11/21   2.   Pt will improve coordination/functional reaching with RUE as shown by improving score on box and blocks test by at least 3. Baseline:  43 blocks Goal status: ONGOING  11/07/21:  45 blocks (improved by 2).   3.  Pt will improve L hand coordination for ADLs/IADLs as shown by completing 9-hole peg test in 39sec or less. Baseline: 43sec Goal status: MET. 31.28 sec   4.  Pt will improve R hand  coordination for ADLs/IADLs as shown by completing 9-hole peg test in 45sec or less. Baseline:  53.94sec Goal status:  MET.  11/07/21:  40.03 sec   5.  Pt will improve coordination/functional reaching with LUE as shown by improving score on box and blocks test by at least 3. Baseline:  42 blocks Goal status:  MET  11/07/21: 49 blocks     PLAN:  CLINICAL IMPRESSION: Pt is progressing towards goal with improving coordination and functional movement patterns.  Pt has met LTGs #3-5.  PERFORMANCE DEFICITS in functional skills including ADLs, IADLs, coordination, dexterity, tone, ROM, FMC, GMC, balance, and UE functional use, cognitive skills including  n/a , and psychosocial skills including habits and routines and behaviors.    IMPAIRMENTS are limiting patient from ADLs, IADLs, and leisure.    COMORBIDITIES may have co-morbidities  that affects occupational performance. Patient will benefit from skilled OT to address above impairments and improve overall function.   MODIFICATION OR ASSISTANCE TO COMPLETE EVALUATION: Min-Moderate modification of tasks or assist with assess necessary to complete an evaluation.   OT OCCUPATIONAL PROFILE  AND HISTORY: Detailed assessment: Review of records and additional review of physical, cognitive, psychosocial history related to current functional performance.   CLINICAL DECISION MAKING: Moderate - several treatment options, min-mod task modification necessary   REHAB POTENTIAL: Good   EVALUATION COMPLEXITY: Moderate  OT FREQUENCY: 2x/week   OT DURATION: 8 weeks or 16 visits over 12 weeks due to scheduling +eval   PLANNED INTERVENTIONS: self care/ADL training, therapeutic exercise, therapeutic activity, neuromuscular re-education, manual therapy, manual lymph drainage, passive range of motion, balance training, functional mobility training, aquatic therapy, splinting, electrical stimulation, ultrasound, paraffin, fluidotherapy, moist heat, cryotherapy, patient/family education, energy conservation, coping strategies training, and DME and/or AE instructions   RECOMMENDED OTHER SERVICES: current with ST, just completed PT at [facility]   CONSULTED AND AGREED [place]: Patient   PLAN FOR NEXT SESSION:    practice fastening buttons, continue with higher-level coordination and ADL strategies, anticipate d/c next week       Vianne Bulls, OTR/L [facility] [address] [place], [place]  [address] [phone] phone [phone] 11/07/21 7:21 AM

## 2021-11-07 ENCOUNTER — Ambulatory Visit: Payer: Medicare Other | Attending: Adult Health | Admitting: Occupational Therapy

## 2021-11-07 ENCOUNTER — Encounter: Payer: Self-pay | Admitting: Occupational Therapy

## 2021-11-07 DIAGNOSIS — R251 Tremor, unspecified: Secondary | ICD-10-CM | POA: Diagnosis not present

## 2021-11-07 DIAGNOSIS — R29818 Other symptoms and signs involving the nervous system: Secondary | ICD-10-CM | POA: Insufficient documentation

## 2021-11-07 DIAGNOSIS — R278 Other lack of coordination: Secondary | ICD-10-CM | POA: Insufficient documentation

## 2021-11-07 DIAGNOSIS — R293 Abnormal posture: Secondary | ICD-10-CM | POA: Insufficient documentation

## 2021-11-07 DIAGNOSIS — R29898 Other symptoms and signs involving the musculoskeletal system: Secondary | ICD-10-CM | POA: Diagnosis not present

## 2021-11-07 DIAGNOSIS — R2681 Unsteadiness on feet: Secondary | ICD-10-CM | POA: Diagnosis not present

## 2021-11-07 NOTE — Therapy (Signed)
OUTPATIENT OCCUPATIONAL THERAPY TREATMENT NOTE   Patient Name: Todd Rice MRN: [id number] DOB:01-13-1956, 66 y.o., male Today's Date: 11/10/2021  PCP: Unk Pinto, MD  REFERRING PROVIDER: Ernestine Mcmurray, NP   END OF SESSION:   OT End of Session - 11/10/21 0726     Visit Number 16    Number of Visits 17    Date for OT Re-Evaluation 11/28/21    Authorization Type UHC Medicare, follows Medicare guidelines    Authorization - Number of Visits 16    Progress Note Due on Visit 54    OT Start Time 0720    OT Stop Time 0800    OT Time Calculation (min) 40 min    Activity Tolerance Patient tolerated treatment well    Behavior During Therapy WFL for tasks assessed/performed                         Past Medical History:  Diagnosis Date   Adult BMI 50.0-59.9 kg/sq m    52.77   Anal fissure    Anxiety    Diabetes mellitus without complication (HCC)    borderline   Elevated PSA 08/13/2019   GERD (gastroesophageal reflux disease)    30 years ago, maybe from peptic ulcer   H/O: gout    STABLE   Hepatic steatosis 10/01/11   History of kidney stones    Hyperlipidemia    Hypertension    Kidney tumor    right upper kidney tumor, monitoring   OSA on CPAP    [facility]   Parkinson disease ([place]) 01/04/2016   REM sleep behavior disorder 12/06/2016   Right ureteral stone    Ureteral calculus, right 06/12/2011   Vitamin D deficiency    Weakness    Past Surgical History:  Procedure Laterality Date   ANAL FISSURE REPAIR  10-12-2000   COLONOSCOPY N/A 07/01/2012   Procedure: COLONOSCOPY;  Surgeon: Lafayette Dragon, MD;  Location: [facility];  Service: Endoscopy;  Laterality: N/A;   CYSTOSCOPY WITH RETROGRADE PYELOGRAM, URETEROSCOPY AND STENT PLACEMENT Bilateral 02/26/2013   Procedure: CYSTOSCOPY WITH RETROGRADE PYELOGRAM, URETEROSCOPY AND STENT PLACEMENT;  Surgeon: Alexis Frock, MD;  Location: [facility];  Service: Urology;  Laterality: Bilateral;   HOLMIUM LASER  APPLICATION Bilateral 29/51/8841   Procedure: HOLMIUM LASER APPLICATION;  Surgeon: Alexis Frock, MD;  Location: [facility];  Service: Urology;  Laterality: Bilateral;   kidney stone removal     06/2011-stent placement   LEFT MEDIAL FEMORAL CONDYLE DEBRIDEMENT & DRILLING/ REMOVAL LOOSE BODY  09-15-2003   NASAL SEPTUM SURGERY  1985   STONE EXTRACTION WITH BASKET  06/12/2011   Procedure: STONE EXTRACTION WITH BASKET;  Surgeon: Claybon Jabs, MD;  Location: [facility];  Service: Urology;  Laterality: Right;   Patient Active Problem List   Diagnosis Date Noted   Constipation 03/24/2021   Gout 03/23/2021   Primary renal papillary carcinoma, right ([place]) 02/18/2020   Hepatic steatosis 02/18/2020   Major depression in remission ([place]) 11/11/2019   Central sleep apnea due to Cheyne-Stokes respiration 08/31/2019   Hyperlipidemia associated with type 2 diabetes mellitus ([place]) 07/09/2019   Aortic atherosclerosis ([place]) by CXR on 06/10/2018 06/10/2018   Type 2 diabetes mellitus with stage 3a chronic kidney disease, without long-term current use of insulin ([place]) 01/21/2018   OSA treated with BiPAP 01/03/2017   REM sleep behavior disorder 12/06/2016   CKD stage 3 due to type 2 diabetes mellitus ([place]) 10/23/2016   Medication  management 10/23/2016   Vitamin D deficiency 10/23/2016   Parkinson's disease ([place]) 01/04/2016   Seasonal and perennial allergic rhinitis 06/21/2014   Asthma, mild intermittent, well-controlled 06/21/2014   Pseudomonas aeruginosa colonization 10/08/2012   CAD (coronary artery disease) 11/15/2011   Essential hypertension 11/15/2011   Morbid obesity with BMI of 40.0-44.9, adult ([place]) 11/15/2011    ONSET DATE: 08/25/21 (referral date)   REFERRING DIAG: Parkinson's Disease   THERAPY DIAG:  Other symptoms and signs involving the nervous system  Other lack of coordination  Other symptoms and signs involving the musculoskeletal system  Abnormal  posture  Unsteadiness on feet  Tremor   PERTINENT HISTORY: Parkinson's Disease, CAD, HTN, HDL, obesity, asthma, DM, CKD, vitamin D deficiency, REM sleep disorder, OSA, aortic atherosclerosis, hx of gout, GERD, hx of anxiety, tumor on R kidney  PRECAUTIONS: None   SUBJECTIVE:   doing ok   PAIN:  Are you having pain? No   OBJECTIVE:   TODAY'S TREATMENT:   Discussed beginning gentle movement with trunk/PWR! Moves soon as able/released after upcoming kidney surgery.    In standing, closed-chain shoulder flex (floor>overhead) and diagonals to each side with trunk rotation with min v.c. for large amplitude movements.  PWR! Hands (basic 4) with min v.c. for large amplitude movements,   With both hands:  with palm on table, individual finger lifts with abduction/adduction, then isolated/individual finger lifts for incr MP ext., then individual finger flex/ext of IPs with hands on table With min v.c. for incr movement amplitude.  Isolated IP flexion/ext for decr rigidity of hands with min v.c.  Thumb circles AAROM with both hands with min v.c. for incr amplitude.  Sliding cards across table with PWR! Hands (step--with each finger) with each hand alternating all with min v.c. and emphasis on incr movement amplitude.  Supination stretch bilaterally with hammer with each hand with min cueing.  Practiced fastening/unfastening buttons with min v.c. for incr movement amplitude and intensity.  Then checked goal--see below.  Rotating ball in R hand with min cueing for incr movement amplitude.             HOME EXERCISE PROGRAM: Pt continues to perform previous HEP with cueing for large amplitude movements.  (Reviewed coordination, finger ROM, PWR! Moves in supine, quadruped) 09/26/21:  Thumb circles  10/04/21:  foam grip for pen, more upright pen position       GOALS: Potential Goals reviewed with patient? Yes    SHORT TERM GOALS: Target date: 09/27/2021   Pt will be independent  with updated PD-specific HEP. Goal status: MET   2.  Pt will improve bilateral coordination to be able to fasten/unfasten 3 buttons in 34sec or less. Baseline:  50, 38.06sec Goal status: 09/29/21:  67.19, 55.25sec.  10/13/21:  37.19sec.  11/10/21:  MET 31.41sec   3.  Pt will be able to write at least 3 sentences with only min decr in size.  Goal status:   09/29/21:  min-mod decr in size, 100% legibility.  10/13/21:  MET 10/13/21   4.  Pt will improve R hand coordination for ADLs/IADLs as shown by completing 9-hole peg test in 49sec or less. Baseline:  53.94sec Goal status:  MET 09/29/21:  55.62sec, 46.94sec.  10/13/21:  36.75sec     LONG TERM GOALS: Target date: 11/18/21--extended due to decr frequency   Pt will verbalize understanding of strategies for ADLs/IADLs to incr ease, incr safety and decr risk of future complications (including strategies for eating, writing, shaving, reaching).  Goal  status: ONGOING 10/11/21   2.   Pt will improve coordination/functional reaching with RUE as shown by improving score on box and blocks test by at least 3. Baseline:  43 blocks Goal status: ONGOING  11/07/21:  45 blocks (improved by 2).   3.  Pt will improve L hand coordination for ADLs/IADLs as shown by completing 9-hole peg test in 39sec or less. Baseline: 43sec Goal status: MET. 31.28 sec   4.  Pt will improve R hand coordination for ADLs/IADLs as shown by completing 9-hole peg test in 45sec or less. Baseline:  53.94sec Goal status:  MET.  11/07/21:  40.03 sec   5.  Pt will improve coordination/functional reaching with LUE as shown by improving score on box and blocks test by at least 3. Baseline:  42 blocks Goal status:  MET  11/07/21: 49 blocks     PLAN:  CLINICAL IMPRESSION: Pt is progressing towards goals with improving coordination and functional movement patterns.    PERFORMANCE DEFICITS in functional skills including ADLs, IADLs, coordination, dexterity, tone, ROM, FMC, GMC, balance, and UE  functional use, cognitive skills including  n/a , and psychosocial skills including habits and routines and behaviors.    IMPAIRMENTS are limiting patient from ADLs, IADLs, and leisure.    COMORBIDITIES may have co-morbidities  that affects occupational performance. Patient will benefit from skilled OT to address above impairments and improve overall function.   MODIFICATION OR ASSISTANCE TO COMPLETE EVALUATION: Min-Moderate modification of tasks or assist with assess necessary to complete an evaluation.   OT OCCUPATIONAL PROFILE AND HISTORY: Detailed assessment: Review of records and additional review of physical, cognitive, psychosocial history related to current functional performance.   CLINICAL DECISION MAKING: Moderate - several treatment options, min-mod task modification necessary   REHAB POTENTIAL: Good   EVALUATION COMPLEXITY: Moderate  OT FREQUENCY: 2x/week   OT DURATION: 8 weeks or 16 visits over 12 weeks due to scheduling +eval   PLANNED INTERVENTIONS: self care/ADL training, therapeutic exercise, therapeutic activity, neuromuscular re-education, manual therapy, manual lymph drainage, passive range of motion, balance training, functional mobility training, aquatic therapy, splinting, electrical stimulation, ultrasound, paraffin, fluidotherapy, moist heat, cryotherapy, patient/family education, energy conservation, coping strategies training, and DME and/or AE instructions   RECOMMENDED OTHER SERVICES: current with ST, just completed PT at [facility]   CONSULTED AND AGREED [place]: Patient   PLAN FOR NEXT SESSION:    practice fastening buttons, continue with higher-level coordination and ADL strategies, anticipate d/c next week       Vianne Bulls, OTR/L [facility] [address] [place], [place]  [address] [phone] phone [phone] 11/10/21 7:27 AM

## 2021-11-08 DIAGNOSIS — G4733 Obstructive sleep apnea (adult) (pediatric): Secondary | ICD-10-CM | POA: Diagnosis not present

## 2021-11-10 ENCOUNTER — Encounter: Payer: Self-pay | Admitting: Occupational Therapy

## 2021-11-10 ENCOUNTER — Ambulatory Visit: Payer: Medicare Other | Admitting: Occupational Therapy

## 2021-11-10 DIAGNOSIS — R29818 Other symptoms and signs involving the nervous system: Secondary | ICD-10-CM

## 2021-11-10 DIAGNOSIS — R251 Tremor, unspecified: Secondary | ICD-10-CM

## 2021-11-10 DIAGNOSIS — R293 Abnormal posture: Secondary | ICD-10-CM | POA: Diagnosis not present

## 2021-11-10 DIAGNOSIS — R29898 Other symptoms and signs involving the musculoskeletal system: Secondary | ICD-10-CM | POA: Diagnosis not present

## 2021-11-10 DIAGNOSIS — R2681 Unsteadiness on feet: Secondary | ICD-10-CM

## 2021-11-10 DIAGNOSIS — R278 Other lack of coordination: Secondary | ICD-10-CM | POA: Diagnosis not present

## 2021-11-14 NOTE — Therapy (Signed)
OUTPATIENT OCCUPATIONAL THERAPY TREATMENT NOTE   Patient Name: Todd Rice MRN: [id number] DOB:Feb 22, 1956, 66 y.o., male Today's Date: 11/15/2021  PCP: Lucky Cowboy, MD  REFERRING PROVIDER: Renelda Loma, NP   END OF SESSION:   OT End of Session - 11/15/21 0735     Visit Number 17    Number of Visits 17    Date for OT Re-Evaluation 11/28/21    Authorization Type UHC Medicare, follows Medicare guidelines    Authorization - Number of Visits 17    Progress Note Due on Visit 20    OT Start Time 0720    OT Stop Time 0800    OT Time Calculation (min) 40 min    Activity Tolerance Patient tolerated treatment well    Behavior During Therapy WFL for tasks assessed/performed                          Past Medical History:  Diagnosis Date   Adult BMI 50.0-59.9 kg/sq m    52.77   Anal fissure    Anxiety    Diabetes mellitus without complication (HCC)    borderline   Elevated PSA 08/13/2019   GERD (gastroesophageal reflux disease)    30 years ago, maybe from peptic ulcer   H/O: gout    STABLE   Hepatic steatosis 10/01/11   History of kidney stones    Hyperlipidemia    Hypertension    Kidney tumor    right upper kidney tumor, monitoring   OSA on CPAP    [facility] [facility]   Parkinson disease (HCC) 01/04/2016   REM sleep behavior disorder 12/06/2016   Right ureteral stone    Ureteral calculus, right 06/12/2011   Vitamin D deficiency    Weakness    Past Surgical History:  Procedure Laterality Date   ANAL FISSURE REPAIR  10-12-2000   COLONOSCOPY N/A 07/01/2012   Procedure: COLONOSCOPY;  Surgeon: Hart Carwin, MD;  [facility];  Service: Endoscopy;  Laterality: N/A;   CYSTOSCOPY WITH RETROGRADE PYELOGRAM, URETEROSCOPY AND STENT PLACEMENT Bilateral 02/26/2013   Procedure: CYSTOSCOPY WITH RETROGRADE PYELOGRAM, URETEROSCOPY AND STENT PLACEMENT;  Surgeon: Sebastian Ache, MD;  Location: [facility];  Service: Urology;  Laterality: Bilateral;   HOLMIUM LASER  APPLICATION Bilateral 02/26/2013   Procedure: HOLMIUM LASER APPLICATION;  Surgeon: Sebastian Ache, MD;  Location: [facility];  Service: Urology;  Laterality: Bilateral;   kidney stone removal     06/2011-stent placement   LEFT MEDIAL FEMORAL CONDYLE DEBRIDEMENT & DRILLING/ REMOVAL LOOSE BODY  09-15-2003   NASAL SEPTUM SURGERY  1985   STONE EXTRACTION WITH BASKET  06/12/2011   Procedure: STONE EXTRACTION WITH BASKET;  Surgeon: Garnett Farm, MD;  Location: [facility];  Service: Urology;  Laterality: Right;   Patient Active Problem List   Diagnosis Date Noted   Constipation 03/24/2021   Gout 03/23/2021   Primary renal papillary carcinoma, 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   Hepatic steatosis 02/18/2020   Major depression in remission (HCC) 11/11/2019   Central sleep apnea due to Cheyne-Stokes respiration 08/31/2019   Hyperlipidemia associated with type 2 diabetes mellitus (HCC) 07/09/2019   Aortic atherosclerosis (HCC) by CXR on 06/10/2018 06/10/2018   Type 2 diabetes mellitus with stage 3a chronic kidney disease, without long-term current use of insulin (HCC) 01/21/2018   OSA treated with BiPAP 01/03/2017   REM sleep behavior disorder 12/06/2016   CKD stage 3 due to type 2 diabetes mellitus (HCC) 10/23/2016  Medication management 10/23/2016   Vitamin D deficiency 10/23/2016   Parkinson's disease ([place]) 01/04/2016   Seasonal and perennial allergic rhinitis 06/21/2014   Asthma, mild intermittent, well-controlled 06/21/2014   Pseudomonas aeruginosa colonization 10/08/2012   CAD (coronary artery disease) 11/15/2011   Essential hypertension 11/15/2011   Morbid obesity with BMI of 40.0-44.9, adult ([place]) 11/15/2011    ONSET DATE: 08/25/21 (referral date)   REFERRING DIAG: Parkinson's Disease   THERAPY DIAG:  Other symptoms and signs involving the nervous system  Other lack of coordination  Other symptoms and signs involving the musculoskeletal system  Abnormal  posture  Unsteadiness on feet  Tremor   PERTINENT HISTORY: Parkinson's Disease, CAD, HTN, HDL, obesity, asthma, DM, CKD, vitamin D deficiency, REM sleep disorder, OSA, aortic atherosclerosis, hx of gout, GERD, hx of anxiety, tumor on R kidney  PRECAUTIONS: None   SUBJECTIVE:   pt agrees with plan to d/c next visit   PAIN:  Are you having pain? No   OBJECTIVE:   TODAY'S TREATMENT:   PWR! Moves (up, rock, twist) in modified quadruped x 15-20 each with min cues For incr movement amplitude.  Functional reach and side step up on 3" inch step to flip cards with min cueing for large amplitude movement, particularly at R hand/elbow.  Simulated ADLs with bag with focus/min-mod cues for large amplitude movements:  Donning/doffing pull-over shirt, donning/doffing pants, pulling bag into hand for clothing adjustment/donning socks, pulling shirt down in back/drying back.  Sliding cards across table with PWR! Hands (step--with each finger) with each hand alternating all with min v.c. and emphasis on incr movement amplitude.    HOME EXERCISE PROGRAM: Pt continues to perform previous HEP with cueing for large amplitude movements.  (Reviewed coordination, finger ROM, PWR! Moves in supine, quadruped) 09/26/21:  Thumb circles  10/04/21:  foam grip for pen, more upright pen position       GOALS: Potential Goals reviewed with patient? Yes    SHORT TERM GOALS: Target date: 09/27/2021   Pt will be independent with updated PD-specific HEP. Goal status: MET   2.  Pt will improve bilateral coordination to be able to fasten/unfasten 3 buttons in 34sec or less. Baseline:  50, 38.06sec Goal status: 09/29/21:  67.19, 55.25sec.  10/13/21:  37.19sec.  11/10/21:  MET 31.41sec   3.  Pt will be able to write at least 3 sentences with only min decr in size.  Goal status:   09/29/21:  min-mod decr in size, 100% legibility.  10/13/21:  MET 10/13/21   4.  Pt will improve R hand coordination for ADLs/IADLs as shown  by completing 9-hole peg test in 49sec or less. Baseline:  53.94sec Goal status:  MET 09/29/21:  55.62sec, 46.94sec.  10/13/21:  36.75sec     LONG TERM GOALS: Target date: 11/18/21--extended due to decr frequency   Pt will verbalize understanding of strategies for ADLs/IADLs to incr ease, incr safety and decr risk of future complications (including strategies for eating, writing, shaving, reaching).  Goal status: ONGOING 10/11/21   2.   Pt will improve coordination/functional reaching with RUE as shown by improving score on box and blocks test by at least 3. Baseline:  43 blocks Goal status: ONGOING  11/07/21:  45 blocks (improved by 2).   3.  Pt will improve L hand coordination for ADLs/IADLs as shown by completing 9-hole peg test in 39sec or less. Baseline: 43sec Goal status: MET. 31.28 sec   4.  Pt will improve R hand coordination for  ADLs/IADLs as shown by completing 9-hole peg test in 45sec or less. Baseline:  53.94sec Goal status:  MET.  11/07/21:  40.03 sec   5.  Pt will improve coordination/functional reaching with LUE as shown by improving score on box and blocks test by at least 3. Baseline:  42 blocks Goal status:  MET  11/07/21: 49 blocks     PLAN:  CLINICAL IMPRESSION: Pt is progressing towards goals with improving coordination and functional movement patterns.    PERFORMANCE DEFICITS in functional skills including ADLs, IADLs, coordination, dexterity, tone, ROM, FMC, GMC, balance, and UE functional use, cognitive skills including  n/a , and psychosocial skills including habits and routines and behaviors.    IMPAIRMENTS are limiting patient from ADLs, IADLs, and leisure.    COMORBIDITIES may have co-morbidities  that affects occupational performance. Patient will benefit from skilled OT to address above impairments and improve overall function.   MODIFICATION OR ASSISTANCE TO COMPLETE EVALUATION: Min-Moderate modification of tasks or assist with assess necessary to complete an  evaluation.   OT OCCUPATIONAL PROFILE AND HISTORY: Detailed assessment: Review of records and additional review of physical, cognitive, psychosocial history related to current functional performance.   CLINICAL DECISION MAKING: Moderate - several treatment options, min-mod task modification necessary   REHAB POTENTIAL: Good   EVALUATION COMPLEXITY: Moderate  OT FREQUENCY: 2x/week   OT DURATION: 8 weeks or 16 visits over 12 weeks due to scheduling +eval   PLANNED INTERVENTIONS: self care/ADL training, therapeutic exercise, therapeutic activity, neuromuscular re-education, manual therapy, manual lymph drainage, passive range of motion, balance training, functional mobility training, aquatic therapy, splinting, electrical stimulation, ultrasound, paraffin, fluidotherapy, moist heat, cryotherapy, patient/family education, energy conservation, coping strategies training, and DME and/or AE instructions   RECOMMENDED OTHER SERVICES: current with ST, just completed PT at [facility]   CONSULTED AND AGREED [place]: Patient   PLAN FOR NEXT SESSION:   discharge/renewal (to review pt education) next session; schedule OT eval in approx 29months       Vianne Bulls, OTR/L [facility] [address] [place], [place]  [address] [phone] phone [phone] 11/15/21 7:39 AM

## 2021-11-15 ENCOUNTER — Other Ambulatory Visit: Payer: Self-pay | Admitting: Nurse Practitioner

## 2021-11-15 ENCOUNTER — Encounter: Payer: Self-pay | Admitting: Occupational Therapy

## 2021-11-15 ENCOUNTER — Ambulatory Visit: Payer: Medicare Other | Admitting: Occupational Therapy

## 2021-11-15 DIAGNOSIS — R278 Other lack of coordination: Secondary | ICD-10-CM

## 2021-11-15 DIAGNOSIS — R251 Tremor, unspecified: Secondary | ICD-10-CM

## 2021-11-15 DIAGNOSIS — R293 Abnormal posture: Secondary | ICD-10-CM | POA: Diagnosis not present

## 2021-11-15 DIAGNOSIS — R2681 Unsteadiness on feet: Secondary | ICD-10-CM

## 2021-11-15 DIAGNOSIS — I10 Essential (primary) hypertension: Secondary | ICD-10-CM

## 2021-11-15 DIAGNOSIS — R29818 Other symptoms and signs involving the nervous system: Secondary | ICD-10-CM | POA: Diagnosis not present

## 2021-11-15 DIAGNOSIS — R29898 Other symptoms and signs involving the musculoskeletal system: Secondary | ICD-10-CM | POA: Diagnosis not present

## 2021-11-15 NOTE — Therapy (Incomplete)
OUTPATIENT OCCUPATIONAL THERAPY RENEWAL/DISCHARGE NOTE   Patient Name: Todd Rice MRN: [id number] DOB:December 02, 1955, 66 y.o., male Today's Date: 11/17/2021  PCP: Unk Pinto, MD  REFERRING PROVIDER: Ernestine Mcmurray, NP   END OF SESSION:   OT End of Session - 11/17/21 0739     Visit Number 18    Number of Visits 18    Date for OT Re-Evaluation 11/28/21    Authorization Type UHC Medicare, follows Medicare guidelines, renewal completed 11/17/21    Authorization - Number of Visits 18    Progress Note Due on Visit 25    OT Start Time 0721    OT Stop Time 0803    OT Time Calculation (min) 42 min    Activity Tolerance Patient tolerated treatment well    Behavior During Therapy [facility] for tasks assessed/performed                           Past Medical History:  Diagnosis Date   Adult BMI 50.0-59.9 kg/sq m    52.77   Anal fissure    Anxiety    Diabetes mellitus without complication (HCC)    borderline   Elevated PSA 08/13/2019   GERD (gastroesophageal reflux disease)    30 years ago, maybe from peptic ulcer   H/O: gout    STABLE   Hepatic steatosis 10/01/11   History of kidney stones    Hyperlipidemia    Hypertension    Kidney tumor    right upper kidney tumor, monitoring   OSA on CPAP    [employer] D[employer]   Parkinson disease ([place]) 01/04/2016   REM sleep behavior disorder 12/06/2016   Right ureteral stone    Ureteral calculus, right 06/12/2011   Vitamin D deficiency    Weakness    Past Surgical History:  Procedure Laterality Date   ANAL FISSURE REPAIR  10-12-2000   COLONOSCOPY N/A 07/01/2012   Procedure: COLONOSCOPY;  Surgeon: Lafayette Dragon, MD;  Location: [facility];  Service: Endoscopy;  Laterality: N/A;   CYSTOSCOPY WITH RETROGRADE PYELOGRAM, URETEROSCOPY AND STENT PLACEMENT Bilateral 02/26/2013   Procedure: CYSTOSCOPY WITH RETROGRADE PYELOGRAM, URETEROSCOPY AND STENT PLACEMENT;  Surgeon: Alexis Frock, MD;  Location: [facility];  Service: Urology;   Laterality: Bilateral;   HOLMIUM LASER APPLICATION Bilateral 62/70/3500   Procedure: HOLMIUM LASER APPLICATION;  Surgeon: Alexis Frock, MD;  Location: [facility];  Service: Urology;  Laterality: Bilateral;   kidney stone removal     06/2011-stent placement   LEFT MEDIAL FEMORAL CONDYLE DEBRIDEMENT & DRILLING/ REMOVAL LOOSE BODY  09-15-2003   NASAL SEPTUM SURGERY  1985   STONE EXTRACTION WITH BASKET  06/12/2011   Procedure: STONE EXTRACTION WITH BASKET;  Surgeon: Claybon Jabs, MD;  [facility];  Service: Urology;  Laterality: Right;   Patient Active Problem List   Diagnosis Date Noted   Constipation 03/24/2021   Gout 03/23/2021   Primary renal papillary carcinoma, right ([place]) 02/18/2020   Hepatic steatosis 02/18/2020   Major depression in remission ([place]) 11/11/2019   Central sleep apnea due to Cheyne-Stokes respiration 08/31/2019   Hyperlipidemia associated with type 2 diabetes mellitus ([place]) 07/09/2019   Aortic atherosclerosis ([place]) by CXR on 06/10/2018 06/10/2018   Type 2 diabetes mellitus with stage 3a chronic kidney disease, without long-term current use of insulin ([place]) 01/21/2018   OSA treated with BiPAP 01/03/2017   REM sleep behavior disorder 12/06/2016   CKD stage 3 due to type 2 diabetes mellitus (  [place]) 10/23/2016   Medication management 10/23/2016   Vitamin D deficiency 10/23/2016   Parkinson's disease ([place]) 01/04/2016   Seasonal and perennial allergic rhinitis 06/21/2014   Asthma, mild intermittent, well-controlled 06/21/2014   Pseudomonas aeruginosa colonization 10/08/2012   CAD (coronary artery disease) 11/15/2011   Essential hypertension 11/15/2011   Morbid obesity with BMI of 40.0-44.9, adult ([place]) 11/15/2011    ONSET DATE: 08/25/21 (referral date)   REFERRING DIAG: Parkinson's Disease   THERAPY DIAG:  Other symptoms and signs involving the nervous system  Other lack of coordination  Other symptoms and signs involving the musculoskeletal  system  Abnormal posture  Unsteadiness on feet  Tremor   PERTINENT HISTORY: Parkinson's Disease, CAD, HTN, HDL, obesity, asthma, DM, CKD, vitamin D deficiency, REM sleep disorder, OSA, aortic atherosclerosis, hx of gout, GERD, hx of anxiety, tumor on R kidney  PRECAUTIONS: None   SUBJECTIVE:   "I am just ready to get through with this surgery"  PAIN:  Are you having pain? No   OBJECTIVE:   TODAY'S TREATMENT:   PWR! Hands (up, rock, twist) x10 with min cueing for incr movement amplitude.  PWR! Moves (basic 4) in standing x 20 each with min cues For incr movement amplitude.  With both hands:  with palm on table, individual finger lifts with abduction/adduction, then isolated/individual finger lifts for incr MP ext. And gross MP ext with palm on table With min v.c. for incr movement amplitude.  Thumb circles alternating, AAROM thumb circles.    Sliding cards across table with PWR! Hands (step--with each finger) with each hand alternating all with min v.c. and emphasis on incr movement amplitude.  Discussed progress, checked remaining goals, and discussed considerations and questions to ask surgeon regarding PD medications/activity recommendations prior to and after surgery.     HOME EXERCISE PROGRAM: Pt continues to perform previous HEP with cueing for large amplitude movements.  (Reviewed coordination, finger ROM, PWR! Moves in supine, quadruped) 09/26/21:  Thumb circles  10/04/21:  foam grip for pen, more upright pen position       GOALS: Potential Goals reviewed with patient? Yes    SHORT TERM GOALS: Target date: 09/27/2021   Pt will be independent with updated PD-specific HEP. Goal status: MET   2.  Pt will improve bilateral coordination to be able to fasten/unfasten 3 buttons in 34sec or less. Baseline:  50, 38.06sec Goal status: 09/29/21:  67.19, 55.25sec.  10/13/21:  37.19sec.  11/10/21:  MET 31.41sec   3.  Pt will be able to write at least 3 sentences with only min  decr in size.  Goal status:   09/29/21:  min-mod decr in size, 100% legibility.  10/13/21:  MET 10/13/21   4.  Pt will improve R hand coordination for ADLs/IADLs as shown by completing 9-hole peg test in 49sec or less. Baseline:  53.94sec Goal status:  MET 09/29/21:  55.62sec, 46.94sec.  10/13/21:  36.75sec     LONG TERM GOALS: Target date: 11/18/21--extended due to decr frequency   Pt will verbalize understanding of strategies for ADLs/IADLs to incr ease, incr safety and decr risk of future complications (including strategies for eating, writing, shaving, reaching).  Goal status: 10/11/21.  MET 11/17/21   2.   Pt will improve coordination/functional reaching with RUE as shown by improving score on box and blocks test by at least 3. Baseline:  43 blocks Goal status: MET  11/07/21:  45 blocks (improved by 2).  11/17/21:  48 blocks   3.  Pt will improve L hand coordination for ADLs/IADLs as shown by completing 9-hole peg test in 39sec or less. Baseline: 43sec Goal status: MET. 31.28 sec   4.  Pt will improve R hand coordination for ADLs/IADLs as shown by completing 9-hole peg test in 45sec or less. Baseline:  53.94sec Goal status:  MET.  11/07/21:  40.03 sec   5.  Pt will improve coordination/functional reaching with LUE as shown by improving score on box and blocks test by at least 3. Baseline:  42 blocks Goal status:  MET  11/07/21: 49 blocks     PLAN:  CLINICAL IMPRESSION: Pt seen for 1 additional visit to complete/review pt education and check remaining goals.  Pt has made good progress with all goals met.     PERFORMANCE DEFICITS in functional skills including ADLs, IADLs, coordination, dexterity, tone, ROM, FMC, GMC, balance, and UE functional use, cognitive skills including  n/a , and psychosocial skills including habits and routines and behaviors.    IMPAIRMENTS are limiting patient from ADLs, IADLs, and leisure.    COMORBIDITIES may have co-morbidities  that affects occupational  performance. Patient will benefit from skilled OT to address above impairments and improve overall function.   MODIFICATION OR ASSISTANCE TO COMPLETE EVALUATION: Min-Moderate modification of tasks or assist with assess necessary to complete an evaluation.   OT OCCUPATIONAL PROFILE AND HISTORY: Detailed assessment: Review of records and additional review of physical, cognitive, psychosocial history related to current functional performance.   CLINICAL DECISION MAKING: Moderate - several treatment options, min-mod task modification necessary   REHAB POTENTIAL: Good   EVALUATION COMPLEXITY: Moderate  OT FREQUENCY: 1 visit to complete remaining education   OT DURATION: renewal for 1 additional visit    PLANNED INTERVENTIONS: self care/ADL training, therapeutic exercise, therapeutic activity, neuromuscular re-education, manual therapy, manual lymph drainage, passive range of motion, balance training, functional mobility training, aquatic therapy, splinting, electrical stimulation, ultrasound, paraffin, fluidotherapy, moist heat, cryotherapy, patient/family education, energy conservation, coping strategies training, and DME and/or AE instructions   RECOMMENDED OTHER SERVICES: current with [facility], just completed PT at [facility]   CONSULTED AND AGREED WITH PLAN OF CARE: Patient   PLAN FOR NEXT SESSION:   renewal/discharge completed; schedule OT eval in approx 6months   OCCUPATIONAL THERAPY DISCHARGE SUMMARY  Visits from Start of Care: 18  Current functional level related to goals / functional outcomes: See above   Remaining deficits: Bradykinesia, rigidity, decr coordination, abnormal posture, decr balance/functional mobility, Tremors   Education / Equipment: Pt was instructed in the following:  updates to PD-specific HEP, adaptive strategies for ADLs/IADLs.  Pt verbalized understanding of all education provided.   Plan: Patient agrees to discharge.  Patient goals were met. Patient is  being discharged due to being pleased with the current functional level.  Pt would benefit from occupational therapy evaluation in approx 7 months to assess for need for further therapy/functional changes due to progressive nature of diagnosis.        Vianne Bulls, OTR/L [facility] [address] [place], [place]  [phone] phone [phone] 11/17/21 8:42 AM

## 2021-11-16 ENCOUNTER — Other Ambulatory Visit (HOSPITAL_BASED_OUTPATIENT_CLINIC_OR_DEPARTMENT_OTHER): Payer: Self-pay

## 2021-11-16 MED ORDER — VALSARTAN-HYDROCHLOROTHIAZIDE 320-25 MG PO TABS
1.0000 | ORAL_TABLET | Freq: Every day | ORAL | 1 refills | Status: DC
  Filled 2021-11-16: qty 90, 90d supply, fill #0
  Filled 2022-02-15: qty 90, 90d supply, fill #1

## 2021-11-17 ENCOUNTER — Encounter: Payer: Self-pay | Admitting: Occupational Therapy

## 2021-11-17 ENCOUNTER — Ambulatory Visit: Payer: Medicare Other | Admitting: Occupational Therapy

## 2021-11-17 DIAGNOSIS — R251 Tremor, unspecified: Secondary | ICD-10-CM

## 2021-11-17 DIAGNOSIS — R2681 Unsteadiness on feet: Secondary | ICD-10-CM

## 2021-11-17 DIAGNOSIS — R29898 Other symptoms and signs involving the musculoskeletal system: Secondary | ICD-10-CM

## 2021-11-17 DIAGNOSIS — R278 Other lack of coordination: Secondary | ICD-10-CM | POA: Diagnosis not present

## 2021-11-17 DIAGNOSIS — R29818 Other symptoms and signs involving the nervous system: Secondary | ICD-10-CM | POA: Diagnosis not present

## 2021-11-17 DIAGNOSIS — R293 Abnormal posture: Secondary | ICD-10-CM

## 2021-11-17 NOTE — Addendum Note (Signed)
Addended by: Vianne Bulls D on: 11/17/2021 08:50 AM   Modules accepted: Orders

## 2021-11-18 ENCOUNTER — Other Ambulatory Visit: Payer: Self-pay | Admitting: Urology

## 2021-11-30 ENCOUNTER — Telehealth: Payer: Self-pay

## 2021-11-30 ENCOUNTER — Other Ambulatory Visit (HOSPITAL_BASED_OUTPATIENT_CLINIC_OR_DEPARTMENT_OTHER): Payer: Self-pay

## 2021-11-30 NOTE — Telephone Encounter (Addendum)
AB 12/06/21- Tried to outreach patient x2 LVM for return call on [phone]  LM-11/30/21-Called pt. And informed him of changes for f/u visit. Explained to pt. He will have a focused-outreach call on 8/1 between 3-5pm. Pt. Verbalized understanding and confirmed visit. Precall questions completed.   Precall questions [place]  Confirm visit date/time: 12/06/21 between 3-5pm   Have you seen any providers since your last visit or any hospitalizations? Pt. Stated he saw his urologist and Dr. Melford Aase since last CP visit. pt. reported no changes in medications, and no hospitalizations    Have you had any problems with getting your medications from your pharmacy? (copay barriers) No   Have there been any changes to your medications since your last visit? (Med adherence, care gaps) No   What is your top health concern you'd like to discuss for your upcoming visit? Pt. stated he has kidney cancer and will be having surgery soon to remove his right kidney.    HC last care plan date: No care plan  Total time spent: 15 min.

## 2021-11-30 NOTE — Progress Notes (Signed)
Called the [facility] scheduler line to request order from [facility] for Dr. Tresa Moore.   No Answer- LVM on phone line.

## 2021-11-30 NOTE — Telephone Encounter (Signed)
LM-11/30/21-Calling pt. To inform him of changes w/ FOC and confirm FOC for 8/1 between 3-5pm. Unable to reach pt. [place].  Precall questions [place]  Confirm visit date/time: 8/1 between 3-5pm  Have you seen any providers since your last visit or any hospitalizations?  Have you had any problems with getting your medications from your pharmacy? (copay barriers)  Have there been any changes to your medications since your last visit? (Med adherence, care gaps)  What is your top health concern you'd like to discuss for your upcoming visit?  HC last care plan date:    Total time spent: 2 min.

## 2021-12-01 ENCOUNTER — Other Ambulatory Visit: Payer: Self-pay | Admitting: Urology

## 2021-12-05 DIAGNOSIS — N3020 Other chronic cystitis without hematuria: Secondary | ICD-10-CM | POA: Diagnosis not present

## 2021-12-05 DIAGNOSIS — D4101 Neoplasm of uncertain behavior of right kidney: Secondary | ICD-10-CM | POA: Diagnosis not present

## 2021-12-05 NOTE — Progress Notes (Signed)
Anesthesia Review:  PCP: Cardiologist : Chest x-ray : EKG : 04/05/21  Echo : Stress test: Cardiac Cath :  Activity level:  Sleep Study/ CPAP : Fasting Blood Sugar :      / Checks Blood Sugar -- times a day:   Blood Thinner/ Instructions /Last Dose: ASA / Instructions/ Last Dose :  81 mg Asp[irin  DM- type  Hgba1c- 12/07/21-[  LVMM for Selita on 12/05/21 and requested orders.  Selita called back and LVMM for proep nurse stating orders in epic but not signed by DR Tresa Moore and DR Tresa Moore is out of country this week .

## 2021-12-05 NOTE — Patient Instructions (Signed)
SURGICAL WAITING ROOM VISITATION Patients having surgery or a procedure may have no more than 2 support people in the waiting area - these visitors may rotate.   Children under the age of 26 must have an adult with them who is not the patient. If the patient needs to stay at the hospital during part of their recovery, the visitor guidelines for inpatient rooms apply. Pre-op nurse will coordinate an appropriate time for 1 support person to accompany patient in pre-op.  This support person may not rotate.    Please refer to the [facility] website for the visitor guidelines for Inpatients (after your surgery is over and you are in a regular room).       Your procedure is scheduled on:  12/16/2021    Report to [facility] Main Entrance    Report to admitting at   [phone]   Call this number if you have problems the morning of surgery [phone]   Do not eat food :After Midnight.   After Midnight you may have the following liquids until ___ 0430am ___ AM DAY OF SURGERY  Water Non-Citrus Juices (without pulp, NO RED) Carbonated Beverages Black Coffee (NO MILK/CREAM OR CREAMERS, sugar ok)  Clear Tea (NO MILK/CREAM OR CREAMERS, sugar ok) regular and decaf                             Plain Jell-O (NO RED)                                           Fruit ices (not with fruit pulp, NO RED)                                     Popsicles (NO RED)                                                               Sports drinks like Gatorade (NO RED)              .        FOLLOW BOWEL PREP AND ANY ADDITIONAL PRE OP INSTRUCTIONS YOU RECEIVED FROM YOUR SURGEON'S OFFICE!!!     Oral Hygiene is also important to reduce your risk of infection.                                    Remember - BRUSH YOUR TEETH THE MORNING OF SURGERY WITH YOUR REGULAR TOOTHPASTE   Do NOT smoke after Midnight   Take these medicines the morning of surgery with A SIP OF WATER:  inhalers as usual and bring,  allopurinol, sinemet  DO NOT TAKE ANY ORAL DIABETIC MEDICATIONS DAY OF YOUR SURGERY  Bring CPAP mask and tubing day of surgery.                              You may not have any metal on your body including hair pins,  jewelry, and body piercing             Do not wear make-up, lotions, powders, perfumes/cologne, or deodorant  Do not wear nail polish including gel and S&S, artificial/acrylic nails, or any other type of covering on natural nails including finger and toenails. If you have artificial nails, gel coating, etc. that needs to be removed by a nail salon please have this removed prior to surgery or surgery may need to be canceled/ delayed if the surgeon/ anesthesia feels like they are unable to be safely monitored.   Do not shave  48 hours prior to surgery.               Men may shave face and neck.   Do not bring valuables to the hospital. [place].   Contacts, dentures or bridgework may not be worn into surgery.   Bring small overnight bag day of surgery.   DO NOT [place]. PHARMACY WILL DISPENSE MEDICATIONS LISTED ON YOUR MEDICATION LIST TO YOU DURING YOUR ADMISSION [place]!    Patients discharged on the day of surgery will not be allowed to drive home.  Someone NEEDS to stay with you for the first 24 hours after anesthesia.   Special Instructions: Bring a copy of your healthcare power of attorney and living will documents         the day of surgery if you haven't scanned them before.              Please read over the following fact sheets you were given: IF YOU HAVE QUESTIONS ABOUT YOUR PRE-OP INSTRUCTIONS PLEASE CALL [phone]     [facility] - Preparing for Surgery Before surgery, you can play an important role.  Because skin is not sterile, your skin needs to be as free of germs as possible.  You can reduce the number of germs on your skin by washing with CHG (chlorahexidine  gluconate) soap before surgery.  CHG is an antiseptic cleaner which kills germs and bonds with the skin to continue killing germs even after washing. Please DO NOT use if you have an allergy to CHG or antibacterial soaps.  If your skin becomes reddened/irritated stop using the CHG and inform your nurse when you arrive at Short Stay. Do not shave (including legs and underarms) for at least 48 hours prior to the first CHG shower.  You may shave your face/neck. Please follow these instructions carefully:  1.  Shower with CHG Soap the night before surgery and the  morning of Surgery.  2.  If you choose to wash your hair, wash your hair first as usual with your  normal  shampoo.  3.  After you shampoo, rinse your hair and body thoroughly to remove the  shampoo.                           4.  Use CHG as you would any other liquid soap.  You can apply chg directly  to the skin and wash                       Gently with a scrungie or clean washcloth.  5.  Apply the CHG Soap to your body ONLY FROM THE NECK DOWN.   Do not use on  face/ open                           Wound or open sores. Avoid contact with eyes, ears mouth and genitals (private parts).                       Wash face,  Genitals (private parts) with your normal soap.             6.  Wash thoroughly, paying special attention to the area where your surgery  will be performed.  7.  Thoroughly rinse your body with warm water from the neck down.  8.  DO NOT shower/wash with your normal soap after using and rinsing off  the CHG Soap.                9.  Pat yourself dry with a clean towel.            10.  Wear clean pajamas.            11.  Place clean sheets on your bed the night of your first shower and do not  sleep with pets. Day of Surgery : Do not apply any lotions/deodorants the morning of surgery.  Please wear clean clothes to the hospital/surgery center.  FAILURE TO FOLLOW THESE INSTRUCTIONS MAY RESULT IN THE CANCELLATION OF YOUR  SURGERY PATIENT SIGNATURE_________________________________  NURSE SIGNATURE__________________________________  ________________________________________________________________________

## 2021-12-06 ENCOUNTER — Telehealth: Payer: Medicare Other | Admitting: Pharmacy Technician

## 2021-12-06 NOTE — Telephone Encounter (Signed)
LM-12/06/21-Pt. Called stating he had a missed call from CP for his visit today. Put pt. On a brief hold and sent CP message via Teams. CP informed me pt. Would need to r/s visit for tomorrow. Pt. Agreed to r/s to tomorrow afternoon between 3-5pm.  Total time spent: 10 min.

## 2021-12-07 ENCOUNTER — Other Ambulatory Visit: Payer: Self-pay

## 2021-12-07 ENCOUNTER — Encounter (HOSPITAL_COMMUNITY): Payer: Self-pay

## 2021-12-07 ENCOUNTER — Other Ambulatory Visit (HOSPITAL_BASED_OUTPATIENT_CLINIC_OR_DEPARTMENT_OTHER): Payer: Self-pay

## 2021-12-07 ENCOUNTER — Ambulatory Visit: Payer: Medicare Other | Admitting: Pharmacy Technician

## 2021-12-07 ENCOUNTER — Encounter (HOSPITAL_COMMUNITY)
Admission: RE | Admit: 2021-12-07 | Discharge: 2021-12-07 | Disposition: A | Discharge status: Home / Self Care | Payer: Medicare Other | Source: Ambulatory Visit | Attending: Urology | Admitting: Urology

## 2021-12-07 VITALS — BP 117/73 | HR 67 | Temp 98.4°F | Resp 16 | Ht 67.0 in | Wt 245.0 lb

## 2021-12-07 DIAGNOSIS — Z79899 Other long term (current) drug therapy: Secondary | ICD-10-CM

## 2021-12-07 DIAGNOSIS — Z01818 Encounter for other preprocedural examination: Secondary | ICD-10-CM

## 2021-12-07 DIAGNOSIS — Z01812 Encounter for preprocedural laboratory examination: Secondary | ICD-10-CM | POA: Insufficient documentation

## 2021-12-07 DIAGNOSIS — E119 Type 2 diabetes mellitus without complications: Secondary | ICD-10-CM | POA: Diagnosis not present

## 2021-12-07 LAB — CBC
HCT: 45.5 % (ref 39.0–52.0)
Hemoglobin: 15.7 g/dL (ref 13.0–17.0)
MCH: 29.6 pg (ref 26.0–34.0)
MCHC: 34.5 g/dL (ref 30.0–36.0)
MCV: 85.7 fL (ref 80.0–100.0)
Platelets: 185 10*3/uL (ref 150–400)
RBC: 5.31 MIL/uL (ref 4.22–5.81)
RDW: 13.6 % (ref 11.5–15.5)
WBC: 8.3 10*3/uL (ref 4.0–10.5)
nRBC: 0 % (ref 0.0–0.2)

## 2021-12-07 LAB — BASIC METABOLIC PANEL
Anion gap: 12 (ref 5–15)
BUN: 22 mg/dL (ref 8–23)
CO2: 24 mmol/L (ref 22–32)
Calcium: 9.8 mg/dL (ref 8.9–10.3)
Chloride: 99 mmol/L (ref 98–111)
Creatinine, Ser: 1.21 mg/dL (ref 0.61–1.24)
GFR, Estimated: >60 mL/min (ref >60)
Glucose, Bld: 146 mg/dL — ABNORMAL HIGH (ref 70–99)
Potassium: 3.8 mmol/L (ref 3.5–5.1)
Sodium: 135 mmol/L (ref 135–145)

## 2021-12-07 LAB — HEMOGLOBIN A1C
Hgb A1c MFr Bld: 6.6 % — ABNORMAL HIGH (ref 4.8–5.6)
Mean Plasma Glucose: 142.72 mg/dL

## 2021-12-07 LAB — GLUCOSE, CAPILLARY: Glucose-Capillary: 148 mg/dL — ABNORMAL HIGH (ref 70–99)

## 2021-12-07 NOTE — Progress Notes (Signed)
Outreach to patient today to discuss overall health. Current A1c was 6.6% today. Patient has had a 15lb weight loss. His BP today was 117/73 HR 67. He is prepping for surgery next week to remove right kidney. He has a bowel regimen in place with Miralax and Senna. He is having no issues with medications or copays. He does worry about being put under again and what it may do to his Parkinsons. Surgery scheduled for 12/16/21.  CPP Televisit: 47mn  AMarda Stalker PharmD Clinical Pharmacist ANaida Sleightbelton'[email or web address] [phone]

## 2021-12-08 ENCOUNTER — Other Ambulatory Visit (HOSPITAL_BASED_OUTPATIENT_CLINIC_OR_DEPARTMENT_OTHER): Payer: Self-pay

## 2021-12-08 ENCOUNTER — Other Ambulatory Visit: Payer: Self-pay | Admitting: Adult Health

## 2021-12-08 DIAGNOSIS — E1122 Type 2 diabetes mellitus with diabetic chronic kidney disease: Secondary | ICD-10-CM

## 2021-12-08 MED ORDER — ALPRAZOLAM 0.5 MG PO TABS
ORAL_TABLET | ORAL | 0 refills | Status: DC
Start: 2021-12-08 — End: 2022-12-04
  Filled 2021-12-08: qty 90, 30d supply, fill #0

## 2021-12-08 MED ORDER — OZEMPIC (0.25 OR 0.5 MG/DOSE) 2 MG/3ML ~~LOC~~ SOPN
PEN_INJECTOR | SUBCUTANEOUS | 1 refills | Status: DC
  Filled 2021-12-08: qty 3, 28d supply, fill #0
  Filled 2022-02-15: qty 3, 28d supply, fill #1

## 2021-12-09 DIAGNOSIS — G4733 Obstructive sleep apnea (adult) (pediatric): Secondary | ICD-10-CM | POA: Diagnosis not present

## 2021-12-12 ENCOUNTER — Other Ambulatory Visit (HOSPITAL_BASED_OUTPATIENT_CLINIC_OR_DEPARTMENT_OTHER): Payer: Self-pay

## 2021-12-12 MED ORDER — FOSFOMYCIN TROMETHAMINE 3 G PO PACK
PACK | ORAL | 0 refills | Status: DC
  Filled 2021-12-12: qty 2, 2d supply, fill #0

## 2021-12-15 MED ORDER — GENTAMICIN SULFATE 40 MG/ML IJ SOLN
5.0000 mg/kg | Freq: Once | INTRAVENOUS | Status: AC
  Administered 2021-12-16: 420 mg via INTRAVENOUS
  Filled 2021-12-15: qty 10.5

## 2021-12-15 NOTE — Anesthesia Preprocedure Evaluation (Addendum)
Anesthesia Evaluation  Patient identified by MRN, date of birth, ID band Patient awake    Reviewed: Allergy & Precautions, H&P , NPO status , Patient's Chart, lab work & pertinent test results  Airway Mallampati: II  TM Distance: >3 FB Neck ROM: Full    Dental no notable dental hx. (+) Teeth Intact, Dental Advisory Given   Pulmonary asthma , sleep apnea and Continuous Positive Airway Pressure Ventilation , former smoker,    Pulmonary exam normal breath sounds clear to auscultation       Cardiovascular Exercise Tolerance: Good hypertension, Pt. on medications  Rhythm:Regular Rate:Normal     Neuro/Psych Depression  Neuromuscular disease    GI/Hepatic negative GI ROS, Neg liver ROS,   Endo/Other  diabetes, Type 2, Oral Hypoglycemic AgentsMorbid obesity  Renal/GU negative Renal ROS  negative genitourinary   Musculoskeletal   Abdominal   Peds  Hematology negative hematology ROS (+)   Anesthesia Other Findings   Reproductive/Obstetrics negative OB ROS                            Anesthesia Physical Anesthesia Plan  ASA: 3  Anesthesia Plan: General   Post-op Pain Management: Tylenol PO (pre-op)*   Induction: Intravenous  PONV Risk Score and Plan: 3 and Ondansetron, Dexamethasone and Midazolam  Airway Management Planned: Oral ETT  Additional Equipment:   Intra-op Plan:   Post-operative Plan: Extubation in OR  Informed Consent: I have reviewed the patients History and Physical, chart, labs and discussed the procedure including the risks, benefits and alternatives for the proposed anesthesia with the patient or authorized representative who has indicated his/her understanding and acceptance.     Dental advisory given  Plan Discussed with: CRNA  Anesthesia Plan Comments:        Anesthesia Quick Evaluation

## 2021-12-16 ENCOUNTER — Other Ambulatory Visit: Payer: Self-pay

## 2021-12-16 ENCOUNTER — Inpatient Hospital Stay (HOSPITAL_COMMUNITY): Payer: Medicare Other | Admitting: Anesthesiology

## 2021-12-16 ENCOUNTER — Inpatient Hospital Stay (HOSPITAL_COMMUNITY)
Admission: RE | Admit: 2021-12-16 | Discharge: 2021-12-18 | DRG: 658 | Disposition: A | Discharge status: Home / Self Care | Payer: Medicare Other | Attending: Urology | Admitting: Urology

## 2021-12-16 ENCOUNTER — Encounter (HOSPITAL_COMMUNITY)
Admission: RE | Disposition: A | Discharge status: Home / Self Care | Payer: Self-pay | Source: Home / Self Care | Attending: Urology

## 2021-12-16 ENCOUNTER — Encounter (HOSPITAL_COMMUNITY): Payer: Self-pay | Admitting: Urology

## 2021-12-16 ENCOUNTER — Other Ambulatory Visit (HOSPITAL_BASED_OUTPATIENT_CLINIC_OR_DEPARTMENT_OTHER): Payer: Self-pay

## 2021-12-16 ENCOUNTER — Inpatient Hospital Stay (HOSPITAL_COMMUNITY): Payer: Medicare Other | Admitting: Physician Assistant

## 2021-12-16 DIAGNOSIS — Z83438 Family history of other disorder of lipoprotein metabolism and other lipidemia: Secondary | ICD-10-CM

## 2021-12-16 DIAGNOSIS — G4733 Obstructive sleep apnea (adult) (pediatric): Secondary | ICD-10-CM | POA: Diagnosis not present

## 2021-12-16 DIAGNOSIS — E669 Obesity, unspecified: Secondary | ICD-10-CM | POA: Diagnosis present

## 2021-12-16 DIAGNOSIS — C641 Malignant neoplasm of right kidney, except renal pelvis: Principal | ICD-10-CM | POA: Diagnosis present

## 2021-12-16 DIAGNOSIS — Z881 Allergy status to other antibiotic agents status: Secondary | ICD-10-CM | POA: Diagnosis not present

## 2021-12-16 DIAGNOSIS — G20 Parkinson's disease: Secondary | ICD-10-CM | POA: Diagnosis present

## 2021-12-16 DIAGNOSIS — Z79899 Other long term (current) drug therapy: Secondary | ICD-10-CM

## 2021-12-16 DIAGNOSIS — D4101 Neoplasm of uncertain behavior of right kidney: Secondary | ICD-10-CM | POA: Diagnosis not present

## 2021-12-16 DIAGNOSIS — N2889 Other specified disorders of kidney and ureter: Secondary | ICD-10-CM | POA: Diagnosis not present

## 2021-12-16 DIAGNOSIS — Z87891 Personal history of nicotine dependence: Secondary | ICD-10-CM

## 2021-12-16 DIAGNOSIS — Z7984 Long term (current) use of oral hypoglycemic drugs: Secondary | ICD-10-CM

## 2021-12-16 DIAGNOSIS — Z7982 Long term (current) use of aspirin: Secondary | ICD-10-CM

## 2021-12-16 DIAGNOSIS — Z9989 Dependence on other enabling machines and devices: Secondary | ICD-10-CM

## 2021-12-16 DIAGNOSIS — Z8249 Family history of ischemic heart disease and other diseases of the circulatory system: Secondary | ICD-10-CM

## 2021-12-16 DIAGNOSIS — M109 Gout, unspecified: Secondary | ICD-10-CM | POA: Diagnosis present

## 2021-12-16 DIAGNOSIS — Z01818 Encounter for other preprocedural examination: Secondary | ICD-10-CM

## 2021-12-16 DIAGNOSIS — E785 Hyperlipidemia, unspecified: Secondary | ICD-10-CM | POA: Diagnosis not present

## 2021-12-16 DIAGNOSIS — Z6838 Body mass index (BMI) 38.0-38.9, adult: Secondary | ICD-10-CM

## 2021-12-16 DIAGNOSIS — E119 Type 2 diabetes mellitus without complications: Secondary | ICD-10-CM | POA: Diagnosis present

## 2021-12-16 DIAGNOSIS — I10 Essential (primary) hypertension: Secondary | ICD-10-CM | POA: Diagnosis not present

## 2021-12-16 DIAGNOSIS — Z7985 Long-term (current) use of injectable non-insulin antidiabetic drugs: Secondary | ICD-10-CM

## 2021-12-16 HISTORY — PX: ROBOT ASSISTED LAPAROSCOPIC NEPHRECTOMY: SHX5140

## 2021-12-16 HISTORY — DX: Other specified disorders of kidney and ureter: N28.89

## 2021-12-16 LAB — TYPE AND SCREEN
ABO/RH(D): O NEG
Antibody Screen: NEGATIVE

## 2021-12-16 LAB — GLUCOSE, CAPILLARY
Glucose-Capillary: 148 mg/dL — ABNORMAL HIGH (ref 70–99)
Glucose-Capillary: 167 mg/dL — ABNORMAL HIGH (ref 70–99)
Glucose-Capillary: 182 mg/dL — ABNORMAL HIGH (ref 70–99)
Glucose-Capillary: 207 mg/dL — ABNORMAL HIGH (ref 70–99)

## 2021-12-16 LAB — HEMOGLOBIN AND HEMATOCRIT, BLOOD
HCT: 43.3 % (ref 39.0–52.0)
Hemoglobin: 14.6 g/dL (ref 13.0–17.0)

## 2021-12-16 LAB — ABO/RH: ABO/RH(D): O NEG

## 2021-12-16 SURGERY — NEPHRECTOMY, RADICAL, ROBOT-ASSISTED, LAPAROSCOPIC, ADULT
Anesthesia: General | Laterality: Right

## 2021-12-16 MED ORDER — MIDAZOLAM HCL 5 MG/5ML IJ SOLN
INTRAMUSCULAR | Status: DC | PRN
  Administered 2021-12-16: 2 mg via INTRAVENOUS

## 2021-12-16 MED ORDER — ACETAMINOPHEN 500 MG PO TABS
ORAL_TABLET | ORAL | Status: AC
  Administered 2021-12-16: 1000 mg via ORAL
  Filled 2021-12-16: qty 2

## 2021-12-16 MED ORDER — DOCUSATE SODIUM 100 MG PO CAPS
100.0000 mg | ORAL_CAPSULE | Freq: Two times a day (BID) | ORAL | Status: DC
  Administered 2021-12-17 – 2021-12-18 (×2): 100 mg via ORAL
  Filled 2021-12-16 (×4): qty 1

## 2021-12-16 MED ORDER — ALBUTEROL SULFATE (2.5 MG/3ML) 0.083% IN NEBU: 2.5000 mL | INHALATION_SOLUTION | RESPIRATORY_TRACT | Status: DC | PRN

## 2021-12-16 MED ORDER — STERILE WATER FOR IRRIGATION IR SOLN
Status: DC | PRN
  Administered 2021-12-16: 1000 mL

## 2021-12-16 MED ORDER — PROPOFOL 10 MG/ML IV BOLUS
INTRAVENOUS | Status: DC | PRN
  Administered 2021-12-16: 200 mg via INTRAVENOUS

## 2021-12-16 MED ORDER — ALLOPURINOL 300 MG PO TABS
300.0000 mg | ORAL_TABLET | Freq: Every day | ORAL | Status: DC
  Administered 2021-12-16 – 2021-12-18 (×3): 300 mg via ORAL
  Filled 2021-12-16 (×3): qty 1

## 2021-12-16 MED ORDER — SODIUM CHLORIDE (PF) 0.9 % IJ SOLN
INTRAMUSCULAR | Status: DC | PRN
  Administered 2021-12-16: 10 mL

## 2021-12-16 MED ORDER — LACTATED RINGERS IR SOLN
Status: DC | PRN
  Administered 2021-12-16: 1000 mL

## 2021-12-16 MED ORDER — HYDROCODONE-ACETAMINOPHEN 5-325 MG PO TABS
1.0000 | ORAL_TABLET | Freq: Four times a day (QID) | ORAL | 0 refills | Status: DC | PRN
  Filled 2021-12-16: qty 20, 3d supply, fill #0

## 2021-12-16 MED ORDER — ONDANSETRON HCL 4 MG/2ML IJ SOLN: 4.0000 mg | INTRAMUSCULAR | Status: DC | PRN

## 2021-12-16 MED ORDER — POLYETHYLENE GLYCOL 3350 17 GM/SCOOP PO POWD: 1.0000 | Freq: Once | ORAL | Status: AC

## 2021-12-16 MED ORDER — TRIPLE ANTIBIOTIC 3.5-400-5000 EX OINT: 1.0000 | TOPICAL_OINTMENT | Freq: Three times a day (TID) | CUTANEOUS | Status: DC | PRN

## 2021-12-16 MED ORDER — ROSUVASTATIN CALCIUM 20 MG PO TABS
20.0000 mg | ORAL_TABLET | Freq: Every day | ORAL | Status: DC
  Administered 2021-12-16 – 2021-12-18 (×3): 20 mg via ORAL
  Filled 2021-12-16 (×3): qty 1

## 2021-12-16 MED ORDER — ONDANSETRON HCL 4 MG/2ML IJ SOLN
INTRAMUSCULAR | Status: AC
Start: 2021-12-16 — End: ?
  Filled 2021-12-16: qty 2

## 2021-12-16 MED ORDER — FENTANYL CITRATE (PF) 100 MCG/2ML IJ SOLN
INTRAMUSCULAR | Status: DC | PRN
  Administered 2021-12-16: 100 ug via INTRAVENOUS
  Administered 2021-12-16: 50 ug via INTRAVENOUS

## 2021-12-16 MED ORDER — ROCURONIUM BROMIDE 10 MG/ML (PF) SYRINGE
PREFILLED_SYRINGE | INTRAVENOUS | Status: DC | PRN
  Administered 2021-12-16: 30 mg via INTRAVENOUS
  Administered 2021-12-16: 100 mg via INTRAVENOUS

## 2021-12-16 MED ORDER — MIDAZOLAM HCL 2 MG/2ML IJ SOLN
INTRAMUSCULAR | Status: AC
  Filled 2021-12-16: qty 2

## 2021-12-16 MED ORDER — PROPOFOL 10 MG/ML IV BOLUS
INTRAVENOUS | Status: AC
  Filled 2021-12-16: qty 20

## 2021-12-16 MED ORDER — EZETIMIBE 10 MG PO TABS
10.0000 mg | ORAL_TABLET | Freq: Every day | ORAL | Status: DC
  Administered 2021-12-16 – 2021-12-18 (×3): 10 mg via ORAL
  Filled 2021-12-16 (×3): qty 1

## 2021-12-16 MED ORDER — HYDROMORPHONE HCL 1 MG/ML IJ SOLN
INTRAMUSCULAR | Status: AC
  Filled 2021-12-16: qty 1

## 2021-12-16 MED ORDER — HYOSCYAMINE SULFATE 0.125 MG SL SUBL: 0.1250 mg | SUBLINGUAL_TABLET | SUBLINGUAL | Status: DC | PRN

## 2021-12-16 MED ORDER — INSULIN ASPART 100 UNIT/ML IJ SOLN
0.0000 [IU] | Freq: Three times a day (TID) | INTRAMUSCULAR | Status: DC
  Administered 2021-12-16 – 2021-12-17 (×3): 3 [IU] via SUBCUTANEOUS
  Administered 2021-12-17 – 2021-12-18 (×2): 2 [IU] via SUBCUTANEOUS

## 2021-12-16 MED ORDER — ROCURONIUM BROMIDE 10 MG/ML (PF) SYRINGE
PREFILLED_SYRINGE | INTRAVENOUS | Status: AC
Start: 2021-12-16 — End: ?
  Filled 2021-12-16: qty 10

## 2021-12-16 MED ORDER — DEXAMETHASONE SODIUM PHOSPHATE 10 MG/ML IJ SOLN
INTRAMUSCULAR | Status: DC | PRN
  Administered 2021-12-16: 10 mg via INTRAVENOUS

## 2021-12-16 MED ORDER — FENTANYL CITRATE (PF) 250 MCG/5ML IJ SOLN
INTRAMUSCULAR | Status: AC
  Filled 2021-12-16: qty 5

## 2021-12-16 MED ORDER — HYDROMORPHONE HCL 1 MG/ML IJ SOLN
0.2500 mg | INTRAMUSCULAR | Status: DC | PRN
  Administered 2021-12-16 (×2): 0.5 mg via INTRAVENOUS

## 2021-12-16 MED ORDER — ALPRAZOLAM 0.5 MG PO TABS
0.5000 mg | ORAL_TABLET | Freq: Three times a day (TID) | ORAL | Status: DC | PRN
  Administered 2021-12-17: 0.5 mg via ORAL
  Filled 2021-12-16: qty 1

## 2021-12-16 MED ORDER — DIPHENHYDRAMINE HCL 50 MG/ML IJ SOLN: 12.5000 mg | Freq: Four times a day (QID) | INTRAMUSCULAR | Status: DC | PRN

## 2021-12-16 MED ORDER — OXYCODONE HCL 5 MG PO TABS
5.0000 mg | ORAL_TABLET | ORAL | Status: DC | PRN
  Administered 2021-12-16: 5 mg via ORAL

## 2021-12-16 MED ORDER — ALBUTEROL SULFATE (2.5 MG/3ML) 0.083% IN NEBU: 2.5000 mL | INHALATION_SOLUTION | RESPIRATORY_TRACT | Status: DC

## 2021-12-16 MED ORDER — ACETAMINOPHEN 500 MG PO TABS
1000.0000 mg | ORAL_TABLET | Freq: Once | ORAL | Status: AC
Start: 2021-12-16 — End: 2021-12-16
  Administered 2021-12-16: 1000 mg via ORAL
  Filled 2021-12-16: qty 2

## 2021-12-16 MED ORDER — DIPHENHYDRAMINE HCL 12.5 MG/5ML PO ELIX: 12.5000 mg | ORAL_SOLUTION | Freq: Four times a day (QID) | ORAL | Status: DC | PRN

## 2021-12-16 MED ORDER — LACTATED RINGERS IV SOLN: INTRAVENOUS | Status: DC

## 2021-12-16 MED ORDER — CARBIDOPA-LEVODOPA 25-250 MG PO TABS
1.0000 | ORAL_TABLET | Freq: Three times a day (TID) | ORAL | Status: DC
  Administered 2021-12-16 – 2021-12-18 (×6): 1 via ORAL
  Filled 2021-12-16 (×8): qty 1

## 2021-12-16 MED ORDER — HYDROMORPHONE HCL 1 MG/ML IJ SOLN
0.5000 mg | INTRAMUSCULAR | Status: DC | PRN
  Administered 2021-12-16: 0.5 mg via INTRAVENOUS

## 2021-12-16 MED ORDER — ORAL CARE MOUTH RINSE: 15.0000 mL | Freq: Once | OROMUCOSAL | Status: AC

## 2021-12-16 MED ORDER — LIDOCAINE 2% (20 MG/ML) 5 ML SYRINGE
INTRAMUSCULAR | Status: DC | PRN
  Administered 2021-12-16: 100 mg via INTRAVENOUS

## 2021-12-16 MED ORDER — ACETAMINOPHEN 500 MG PO TABS
1000.0000 mg | ORAL_TABLET | Freq: Four times a day (QID) | ORAL | Status: AC
  Administered 2021-12-16 – 2021-12-17 (×3): 1000 mg via ORAL
  Filled 2021-12-16 (×3): qty 2

## 2021-12-16 MED ORDER — OXYCODONE HCL 5 MG PO TABS
ORAL_TABLET | ORAL | Status: AC
  Filled 2021-12-16: qty 1

## 2021-12-16 MED ORDER — BUPIVACAINE LIPOSOME 1.3 % IJ SUSP
INTRAMUSCULAR | Status: AC
  Filled 2021-12-16: qty 20

## 2021-12-16 MED ORDER — PRAMIPEXOLE DIHYDROCHLORIDE 1 MG PO TABS
1.0000 mg | ORAL_TABLET | Freq: Three times a day (TID) | ORAL | Status: DC
  Administered 2021-12-16 – 2021-12-18 (×3): 1 mg via ORAL
  Filled 2021-12-16 (×5): qty 1

## 2021-12-16 MED ORDER — PHENYLEPHRINE 80 MCG/ML (10ML) SYRINGE FOR IV PUSH (FOR BLOOD PRESSURE SUPPORT)
PREFILLED_SYRINGE | INTRAVENOUS | Status: DC | PRN
  Administered 2021-12-16: 160 ug via INTRAVENOUS
  Administered 2021-12-16: 80 ug via INTRAVENOUS
  Administered 2021-12-16: 160 ug via INTRAVENOUS
  Administered 2021-12-16 (×2): 80 ug via INTRAVENOUS
  Administered 2021-12-16 (×2): 160 ug via INTRAVENOUS

## 2021-12-16 MED ORDER — CHLORHEXIDINE GLUCONATE 0.12 % MT SOLN
15.0000 mL | Freq: Once | OROMUCOSAL | Status: AC
  Administered 2021-12-16: 15 mL via OROMUCOSAL

## 2021-12-16 MED ORDER — BUPIVACAINE LIPOSOME 1.3 % IJ SUSP
INTRAMUSCULAR | Status: DC | PRN
  Administered 2021-12-16: 20 mL

## 2021-12-16 MED ORDER — ONDANSETRON HCL 4 MG/2ML IJ SOLN
INTRAMUSCULAR | Status: DC | PRN
  Administered 2021-12-16: 4 mg via INTRAVENOUS

## 2021-12-16 MED ORDER — SUGAMMADEX SODIUM 200 MG/2ML IV SOLN
INTRAVENOUS | Status: DC | PRN
  Administered 2021-12-16: 300 mg via INTRAVENOUS

## 2021-12-16 MED ORDER — SODIUM CHLORIDE (PF) 0.9 % IJ SOLN
INTRAMUSCULAR | Status: AC
Start: 2021-12-16 — End: ?
  Filled 2021-12-16: qty 20

## 2021-12-16 MED ORDER — SODIUM CHLORIDE 0.9 % IV SOLN: INTRAVENOUS | Status: DC

## 2021-12-16 SURGICAL SUPPLY — 63 items
ADH SKN CLS APL DERMABOND .7 (GAUZE/BANDAGES/DRESSINGS) ×2
APL PRP STRL LF DISP 70% ISPRP (MISCELLANEOUS) ×1
BAG COUNTER SPONGE SURGICOUNT (BAG) ×3 IMPLANT
BAG LAPAROSCOPIC 12 15 PORT 16 (BASKET) ×2 IMPLANT
BAG RETRIEVAL 12/15 (BASKET) ×2
BAG SPNG CNTER NS LX DISP (BAG) ×1
CHLORAPREP W/TINT 26 (MISCELLANEOUS) ×3 IMPLANT
CLIP LIGATING HEM O LOK PURPLE (MISCELLANEOUS) ×3 IMPLANT
CLIP LIGATING HEMO LOK XL GOLD (MISCELLANEOUS) ×3 IMPLANT
CLIP LIGATING HEMO O LOK GREEN (MISCELLANEOUS) ×3 IMPLANT
COVER SURGICAL LIGHT HANDLE (MISCELLANEOUS) ×3 IMPLANT
COVER TIP SHEARS 8 DVNC (MISCELLANEOUS) ×2 IMPLANT
COVER TIP SHEARS 8MM DA VINCI (MISCELLANEOUS) ×2
CUTTER ECHEON FLEX ENDO 45 340 (ENDOMECHANICALS) ×1 IMPLANT
DERMABOND ADVANCED (GAUZE/BANDAGES/DRESSINGS) ×2
DERMABOND ADVANCED .7 DNX12 (GAUZE/BANDAGES/DRESSINGS) ×4 IMPLANT
DRAIN CHANNEL 15F RND FF 3/16 (WOUND CARE) IMPLANT
DRAPE ARM DVNC X/XI (DISPOSABLE) ×8 IMPLANT
DRAPE COLUMN DVNC XI (DISPOSABLE) ×2 IMPLANT
DRAPE DA VINCI XI ARM (DISPOSABLE) ×8
DRAPE DA VINCI XI COLUMN (DISPOSABLE) ×2
DRAPE INCISE IOBAN 66X45 STRL (DRAPES) ×3 IMPLANT
DRAPE SHEET LG 3/4 BI-LAMINATE (DRAPES) ×3 IMPLANT
ELECT PENCIL ROCKER SW 15FT (MISCELLANEOUS) ×3 IMPLANT
ELECT REM PT RETURN 15FT ADLT (MISCELLANEOUS) ×3 IMPLANT
EVACUATOR SILICONE 100CC (DRAIN) IMPLANT
GLOVE BIO SURGEON STRL SZ 6.5 (GLOVE) ×3 IMPLANT
GLOVE SURG LX 7.5 STRW (GLOVE) ×2
GLOVE SURG LX STRL 7.5 STRW (GLOVE) ×4 IMPLANT
GOWN SRG XL LVL 4 BRTHBL STRL (GOWNS) ×2 IMPLANT
GOWN STRL NON-REIN XL LVL4 (GOWNS) ×2
HOLDER FOLEY CATH W/STRAP (MISCELLANEOUS) ×3 IMPLANT
IRRIG SUCT STRYKERFLOW 2 WTIP (MISCELLANEOUS) ×2
IRRIGATION SUCT STRKRFLW 2 WTP (MISCELLANEOUS) ×2 IMPLANT
KIT BASIN OR (CUSTOM PROCEDURE TRAY) ×3 IMPLANT
KIT TURNOVER KIT A (KITS) IMPLANT
LOOP VESSEL MAXI BLUE (MISCELLANEOUS) ×1 IMPLANT
NDL INSUFFLATION 14GA 120MM (NEEDLE) ×2 IMPLANT
NEEDLE INSUFFLATION 14GA 120MM (NEEDLE) ×2 IMPLANT
PORT ACCESS TROCAR AIRSEAL 12 (TROCAR) ×2 IMPLANT
PORT ACCESS TROCAR AIRSEAL 5M (TROCAR) ×1
PROTECTOR NERVE ULNAR (MISCELLANEOUS) ×7 IMPLANT
RELOAD STAPLE 45 2.6 WHT THIN (STAPLE) IMPLANT
SEAL CANN UNIV 5-8 DVNC XI (MISCELLANEOUS) ×8 IMPLANT
SEAL XI 5MM-8MM UNIVERSAL (MISCELLANEOUS) ×8
SET TRI-LUMEN FLTR TB AIRSEAL (TUBING) ×3 IMPLANT
SOLUTION ELECTROLUBE (MISCELLANEOUS) ×3 IMPLANT
SPIKE FLUID TRANSFER (MISCELLANEOUS) ×2 IMPLANT
SPONGE T-LAP 4X18 ~~LOC~~+RFID (SPONGE) ×3 IMPLANT
STAPLE RELOAD 45 WHT (STAPLE) ×6 IMPLANT
STAPLE RELOAD 45MM WHITE (STAPLE) ×12
SUT ETHILON 3 0 PS 1 (SUTURE) IMPLANT
SUT MNCRL AB 4-0 PS2 18 (SUTURE) ×6 IMPLANT
SUT PDS AB 1 CT1 27 (SUTURE) ×9 IMPLANT
SUT VICRYL 0 UR6 27IN ABS (SUTURE) IMPLANT
TOWEL OR 17X26 10 PK STRL BLUE (TOWEL DISPOSABLE) ×3 IMPLANT
TOWEL OR NON WOVEN STRL DISP B (DISPOSABLE) ×3 IMPLANT
TRAY FOLEY MTR SLVR 16FR STAT (SET/KITS/TRAYS/PACK) ×3 IMPLANT
TRAY LAPAROSCOPIC (CUSTOM PROCEDURE TRAY) ×3 IMPLANT
TROCAR ADV FIXATION 12X100MM (TROCAR) ×3 IMPLANT
TROCAR Z THREAD OPTICAL 12X100 (TROCAR) ×1 IMPLANT
TROCAR Z-THREAD OPTICAL 5X100M (TROCAR) ×1 IMPLANT
WATER STERILE IRR 1000ML POUR (IV SOLUTION) ×3 IMPLANT

## 2021-12-16 NOTE — Discharge Instructions (Addendum)
1- Drain Sites - You may have some mild persistent drainage from old drain site for several days, this is normal. This can be covered with cotton gauze for convenience.  2 - Stiches - Your stitches are all dissolvable. You may notice a "loose thread" at your incisions, these are normal and require no intervention. You may cut them flush to the skin with fingernail clippers if needed for comfort.  3 - Diet - No restrictions  4 - Activity - No heavy lifting / straining (any activities that require valsalva or "bearing down") x 4 weeks. Otherwise, no restrictions.  5 - Bathing - You may shower immediately. Do not take a bath or get into swimming pool where incision sites are submersed in water x 4 weeks.   6 -  When to Call the Doctor - Call MD for any fever >102, any acute wound problems, or any severe nausea / vomiting. You can call the [facility] Office [phone]) 24 hours a day 365 days a year. It will roll-over to the answering service and on-call physician after hours.  You may resume aspirin, advil, aleve, vitamins, and supplements 7 days after surgery.   

## 2021-12-16 NOTE — Anesthesia Procedure Notes (Signed)
Procedure Name: Intubation Date/Time: 12/16/2021 8:11 AM  Performed by: Gean Maidens, CRNAPre-anesthesia Checklist: Patient identified, Emergency Drugs available, Suction available, Patient being monitored and Timeout performed Patient Re-evaluated:Patient Re-evaluated prior to induction Oxygen Delivery Method: Circle system utilized Preoxygenation: Pre-oxygenation with 100% oxygen Induction Type: IV induction Ventilation: Two handed mask ventilation required, Mask ventilation with difficulty and Oral airway inserted - appropriate to patient size Laryngoscope Size: Mac and 4 Grade View: Grade I Tube type: Oral Tube size: 7.5 mm Number of attempts: 1 Airway Equipment and Method: Stylet Placement Confirmation: ETT inserted through vocal cords under direct vision and breath sounds checked- equal and bilateral Secured at: 23 cm Tube secured with: Tape Dental Injury: Teeth and Oropharynx as per pre-operative assessment

## 2021-12-16 NOTE — Anesthesia Postprocedure Evaluation (Signed)
Anesthesia Post Note  Patient: Todd Rice  Procedure(s) Performed: XI ROBOTIC ASSISTED LAPAROSCOPIC RADICAL NEPHRECTOMY (Right)     Patient location during evaluation: PACU Anesthesia Type: General Level of consciousness: awake and alert Pain management: pain level controlled Vital Signs Assessment: post-procedure vital signs reviewed and stable Respiratory status: spontaneous breathing, nonlabored ventilation and respiratory function stable Cardiovascular status: blood pressure returned to baseline and stable Postop Assessment: no apparent nausea or vomiting Anesthetic complications: no   No notable events documented.  Last Vitals:  Vitals:   12/16/21 1200 12/16/21 1300  BP: (!) 156/75 114/79  Pulse: 91 100  Resp: 17 13  Temp:    SpO2: 100% 95%    Last Pain:  Vitals:   12/16/21 1300  TempSrc:   PainSc: 3                  Nyxon Strupp,W. EDMOND

## 2021-12-16 NOTE — Op Note (Unsigned)
NAME: NIV, DARLEY MEDICAL RECORD NO: [id number] ACCOUNT NO: [id number] DATE OF BIRTH: [birth year] FACILITY: [facility] LOCATION: [facility] PHYSICIAN: Alexis Frock, MD  Operative Report   DATE OF PROCEDURE: 12/16/2021  SURGEON:  Alexis Frock, MD  PREOPERATIVE DIAGNOSIS:  Enlarging right renal mass.  POSTOPERATIVE DIAGNOSIS:  Enlarging right renal mass.  PROCEDURE:  Robotic-assisted laparoscopic right radical nephrectomy.  ESTIMATED BLOOD LOSS:  50 mL.  COMPLICATIONS:  None.  SPECIMEN:  Right radical nephrectomy.  ASSISTANT:  Debbrah Alar, PA  DRAINS:  Foley catheter to straight drain.  FINDINGS: 1.  Two artery, three vein, right renovascular anatomy anticipated. 2.  Medial posterior very hilar renal mass.  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  He does have a history of progressive, though well-controlled Parkinson's disease.  He has a history of urolithiasis, chronic pseudomonas  bacteriuria as well as a small right renal mass.  He has been surveilled with this for a number of years.  Unfortunately, his right renal mass has continued to grow and progress.  It is in the location that is very hilar and posterior.  It is now over 3  cm and concerning for localized, but progressive renal cell carcinoma.  Options were discussed for management including continued surveillance versus ablative therapy versus surgical extirpation and he adamantly wished to proceed with surgical  extirpation.  Given the very hilar location and medial orientation, it was not felt that this was amenable to nephron-sparing and that a radical nephrectomy would be most advantageous for oncologic perspective.  He understands competing risks and wished  to proceed.  Informed consent was obtained and placed in medical record.  PROCEDURE IN DETAIL:  The patient being Todd Rice verified and procedure being right radical nephrectomy  was confirmed.  Procedure timeout was performed.  Intravenous antibiotics were administered.  General endotracheal anesthesia induced.   The patient was placed into a right side up full flank position and pulling 15 degrees of table flexion, superior arm elevator, axillary roll sequential compression devices, bottom leg bent, top leg straight.  Beanbag was deployed.  He was further  fastened to operating table using 3-inch tape over padding across the supraxiphoid chest and his pelvis.  Next, a high flow, low pressure pneumoperitoneum was obtained using Veress technique in the right lower quadrant, having passed the aspiration drop  test.  Next, an 8 mm robotic camera port was placed in position approximately 1.5 handbreadths inferior to the costal margin approximately midway between the xiphoid and umbilicus.  Laparoscopic examination of the peritoneal cavity revealed no  significant adhesions, no visceral injury.  Additional ports were placed as follows:  Right subcostal 8 mm robotic port, approximately 2 fingerbreadths inferior to the costal margin; right far lateral 8 mm robotic port, approximately 1 handbreadth  superior medial to the anterior iliac spine; right paramedian inferior robotic port, approximately 1.5 handbreadth superior to the pubic ramus and two 12 mm assistant port sites in the paramedian location, one approximately 2 fingerbreadths superior to  the plane of camera port and another approximately 2 fingerbreadths inferior to the plane of the camera port, the superior one being AirSeal type and finally a 5 mm port in the subxiphoid location through which a laparoscopic grasper was used to elevate  the inferior aspect of the liver to provide better visualization of the kidney.  Robot was docked and passed the electronic checks.  Initial attention was directed at development of  retroperitoneum.  Incision was made lateral to the ascending colon from  the area of the cecum towards the area of  the hepatic flexure.  The colon was carefully swept medially.  The duodenum was encountered and very carefully kocherized medially such that it lie medial to the lateral border of the inferior vena cava.  Lower  pole of the kidney area was identified, placed on gentle lateral traction.  Dissection proceeded medial to this. The ureter and gonadal vessels were encountered.  Gonadal vessels were allowed to fall medially.  The ureter was kept lateral.  Dissection  proceeded within the triangle of the ureter and psoas musculature towards the renal hilum.  Renal hilum was quite complex as anticipated consisted of 3 vein, 2 artery, renovascular anatomy. The inferior vein was controlled using vascular stapler.  This  allowed better visualization of the more dominant lower renal artery that was controlled at its common trunk using extra large Hem-o-lok clip proximal, vascular stapler distal followed by the mid vein using vascular load stapler.  This allowed better  visualization of the superior accessory artery, which was small in caliber in superior vein. The artery was controlled using medium Hem-o-lok clip proximal, vascular stapler distal and the final superior vein using vascular stapler.  This resulted in  excellent hemostatic control of renal hilum.  I was quite happy with the safety and completeness of this.  Superior attachments were taken down using cautery scissors.  A partial adrenal sparing was performed and the medial adrenal attachments were taken  down with vascular stapler.  Lateral attachments were released and the ureter was doubly clipped and ligated.  This completely freed up the right radical nephrectomy specimen, it was placed into EndoCatch bag for later retrieval.  Abdomen was once again  inspected.  Hemostasis was excellent.  Sponge and needle counts were correct.  Liver retractor was taken down.  No evidence of hepatic injury was noted.  Robot was then undocked.  Specimen was retrieved by  connecting the two 12 mm assistant port sites  removing the nephrectomy specimen from here, setting aside for permanent pathology.  Extraction site was closed with fascia using figure-of-eight PDS x5 followed by reapproximation of Scarpa's with running Vicryl.  All incision sites were infiltrated  with dilute lipolyzed Marcaine and closed at the level of the skin using subcuticular Monocryl followed by Dermabond.  Procedure was then terminated.  The patient tolerated the procedure well, no immediate periprocedural complications.  The patient was  taken to postanesthesia care in stable condition.  Plan for inpatient admission.  Please note, first assistant, Debbrah Alar was crucial for all portions of the surgery today.  She provided invaluable retraction, suctioning, vascular clipping, vascular stapling, specimen manipulation, and general first assistance.   NIK D: 12/16/2021 9:52:16 am T: 12/16/2021 11:59:00 am  JOB: [id number]/ [id number]

## 2021-12-16 NOTE — Brief Op Note (Signed)
12/16/2021  9:44 AM  PATIENT:  Shanon Payor  66 y.o. male  PRE-OPERATIVE DIAGNOSIS:  RIGHT RENAL MASS  POST-OPERATIVE DIAGNOSIS:  RIGHT RENAL MASS  PROCEDURE:  Procedure(s) with comments: XI ROBOTIC ASSISTED LAPAROSCOPIC RADICAL NEPHRECTOMY (Right) - 3 HRS  SURGEON:  Surgeon(s) and Role:    Alexis Frock, MD - Primary  PHYSICIAN ASSISTANT:   ASSISTANTS: Debbrah Alar PA   ANESTHESIA:   local and general  EBL:  100 mL   BLOOD ADMINISTERED:none  DRAINS:  foley to gravity    LOCAL MEDICATIONS USED:  MARCAINE     SPECIMEN:  Source of Specimen:  Rt kidney  DISPOSITION OF SPECIMEN:  PATHOLOGY  COUNTS:  YES  TOURNIQUET:  * No tourniquets in log *  DICTATION: .Other Dictation: Dictation Number **[id number]  PLAN OF CARE: Admit to inpatient   PATIENT DISPOSITION:  PACU - hemodynamically stable.   Delay start of Pharmacological VTE agent (>24hrs) due to surgical blood loss or risk of bleeding: yes

## 2021-12-16 NOTE — Transfer of Care (Signed)
Immediate Anesthesia Transfer of Care Note  Patient: Todd Rice  Procedure(s) Performed: XI ROBOTIC ASSISTED LAPAROSCOPIC RADICAL NEPHRECTOMY (Right)  Patient Location: PACU  Anesthesia Type:General  Level of Consciousness: awake, alert  and oriented  Airway & Oxygen Therapy: Patient Spontanous Breathing and Patient connected to face mask oxygen  Post-op Assessment: Report given to RN and Post -op Vital signs reviewed and stable  Post vital signs: Reviewed and stable  Last Vitals:  Vitals Value Taken Time  BP    Temp    Pulse 94 12/16/21 0957  Resp 19 12/16/21 0957  SpO2 98 % 12/16/21 0957  Vitals shown include unvalidated device data.  Last Pain:  Vitals:   12/16/21 0619  TempSrc: Oral  PainSc:          Complications: No notable events documented.

## 2021-12-16 NOTE — H&P (Signed)
Todd Rice is an 66 y.o. male.    Chief Complaint: Pre-OP RIGHT Robotic Radical Nephrectomy  HPI:   1 - Rt Renal Neoplasm - Pt with 1.6cm posterior-mid mass with some enhancement, 80% endophytic, incidental on MRI for back pain. ON surveillance.   Recent summarized surveillance:  06/2014 - 1.7cm, ? tiny enlargement (few mm), 2 artery (acc upper pole) / 3 vein renovascular anatomy  08/2019 - 2.3cm, ?tiny change, stone free, 1.6cm pancreatic IPMN  08/2020 - 2.5cm, stone free, 1.6cm pancreatic IPMN  10/2021 - 3.2 cm, stone free, stable IPMN, Cr 1.2.    2 -  Chronic pseudomonal UTI - Previously on methenamine and vitamin C suppression for chronic pseudomonas x several years w/o intervening symptoms on therapy. Most recent UCX sens to Cipro, Zosyn. Now off suppression w/o recurrent UTI after clearance of stones.    PMH sig for obesity, ortho surgery, DM2 (A1c 7s) early Parkinson's (stabilized, affecitng Rt dominant hand forcing early retirement, follows Todd Parkins MD neurol). No CV disease. No strong blood thinners. 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 His PCP is Todd Comber NP with [facility].   Today "Todd Rice" is seen to proceed with RIGHT robotic radical nephrectomy for enlarging, endophytic, hilar renal mass. No interval fevers. Has been on monurol to reduce psueomonas colonization. Hgb 15, Cr 1.2.   Past Medical History:  Diagnosis Date   Adult BMI 50.0-59.9 kg/sq m    52.77   Anal fissure    Cancer ([place])    kidney cancer   Diabetes mellitus without complication (HCC)    borderline   Elevated PSA 08/13/2019   H/O: gout    STABLE   Hepatic steatosis 10/01/2011   History of kidney stones    Hyperlipidemia    Hypertension    Kidney tumor    right upper kidney tumor, monitoring   OSA on CPAP    [employer] [facility]   Parkinson disease ([place]) 01/04/2016   REM sleep behavior disorder 12/06/2016   Right ureteral  stone    Ureteral calculus, right 06/12/2011   Vitamin D deficiency    Weakness     Past Surgical History:  Procedure Laterality Date   ANAL FISSURE REPAIR  10-12-2000   COLONOSCOPY N/A 07/01/2012   Procedure: COLONOSCOPY;  Surgeon: Lafayette Dragon, MD;  Location: [facility];  Service: Endoscopy;  Laterality: N/A;   CYSTOSCOPY WITH RETROGRADE PYELOGRAM, URETEROSCOPY AND STENT PLACEMENT Bilateral 02/26/2013   Procedure: CYSTOSCOPY WITH RETROGRADE PYELOGRAM, URETEROSCOPY AND STENT PLACEMENT;  Surgeon: Alexis Frock, MD;  Location: [facility];  Service: Urology;  Laterality: Bilateral;   HOLMIUM LASER APPLICATION Bilateral 40/12/6759   Procedure: HOLMIUM LASER APPLICATION;  Surgeon: Alexis Frock, MD;  Location: [facility];  Service: Urology;  Laterality: Bilateral;   kidney stone removal     06/2011-stent placement   LEFT MEDIAL FEMORAL CONDYLE DEBRIDEMENT & DRILLING/ REMOVAL LOOSE BODY  09-15-2003   NASAL SEPTUM SURGERY  1985   STONE EXTRACTION WITH BASKET  06/12/2011   Procedure: STONE EXTRACTION WITH BASKET;  Surgeon: Claybon Jabs, MD;  Location: [facility];  Service: Urology;  Laterality: Right;    Family History  Problem Relation Age of Onset   Heart disease Mother        Atrial fibrillation   Hypertension Mother    Hyperlipidemia Mother    Lung cancer Father        was a smoker  Cancer Father        lung   Hyperlipidemia Brother    Melanoma Brother    Heart disease Brother        Amyloid   Heart disease Maternal Aunt    Hyperlipidemia Maternal Aunt    Hypertension Maternal Aunt    Stroke Maternal Aunt    Heart disease Paternal Grandmother    Hyperlipidemia Paternal Grandmother    Parkinson's disease Neg Hx    Sleep apnea Neg Hx    Social History:  reports that he has quit smoking. He has never used smokeless tobacco. He reports that he does not drink alcohol and does not use drugs.  Allergies:  Allergies  Allergen Reactions   Ciprofloxacin Hives    Ceftriaxone Hives    Medications Prior to Admission  Medication Sig Dispense Refill   albuterol (VENTOLIN HFA) 108 (90 Base) MCG/ACT inhaler INHALE 2 PUFFS EVERY 4 HOURS (15 MINUTES APART) FOR ASTHMA RESCUE 6.7 g 1   allopurinol (ZYLOPRIM) 300 MG tablet TAKE 1 TABLET BY MOUTH ONCE A DAY TO TO PREVENT GOUT 90 tablet 3   ALPRAZolam (XANAX) 0.5 MG tablet Take 1 tab up to three times a day only if needed for severe anxiety or panic attack. Avoid daily use. 90 tablet 0   calcium carbonate (TUMS - DOSED IN MG ELEMENTAL CALCIUM) 500 MG chewable tablet Chew 1 tablet by mouth daily as needed for indigestion or heartburn.     carbidopa-levodopa (SINEMET IR) 25-250 MG tablet TAKE 1 TABLET BY MOUTH 3 TIMES DAILY (Patient taking differently: Take 1 tablet by mouth 3 (three) times daily. PT states takes at 0630am, 1230pm and 630pm at nite.  PT wants to stay on schedule within 30 minutes of these times at all possible.) 270 tablet 3   Cholecalciferol (VITAMIN D) 50 MCG (2000 UT) tablet Take 2,000 Units by mouth 2 (two) times daily.     ezetimibe (ZETIA) 10 MG tablet Take  1 tablet  Daily  for Cholesterol 90 tablet 3   fosfomycin (MONUROL) 3 g PACK 1 packet PO Begin today and repeat on Thursday 2 each 0   glipiZIDE (GLUCOTROL) 5 MG tablet Take 1/2 tab as needed for glucose above 150. (Patient taking differently: Take 2.5-5 mg by mouth See admin instructions. Take 2.5 mg as needed for glucose above 150-199, take 5 mg as needed for glucose 200 or higher) 60 tablet 2   Magnesium 250 MG TABS Take 1 tablet (250 mg total) by mouth 2 (two) times daily with a meal. (Patient taking differently: Take 1 tablet by mouth daily.)     Menthol, Topical Analgesic, (BIOFREEZE EX) Apply 1 Application topically daily as needed (pain).     metFORMIN (GLUCOPHAGE-XR) 500 MG 24 hr tablet TAKE 2 TABLETS BY MOUTH 2 TIMES DAILY 360 tablet 3   polyethylene glycol powder (GLYCOLAX/MIRALAX) 17 GM/SCOOP powder Take 17 g by mouth every other day.  Alternating days with senna     pramipexole (MIRAPEX) 1 MG tablet Take 1 tablet (1 mg total) by mouth 3 (three) times daily. 270 tablet 3   rosuvastatin (CRESTOR) 20 MG tablet Take 1 tablet (20 mg total) by mouth daily. 90 tablet 3   Semaglutide,0.25 or 0.'5MG'$ /DOS, (OZEMPIC, 0.25 OR 0.5 MG/DOSE,) 2 MG/3ML SOPN Start by injecting 0.25 mg into skin of stomach once weekly; if doing well increase to 0.5 mg in 4 weeks and continue at this dose. 3 mL 1   senna (SENOKOT) 8.6 MG tablet Take 2  tablets by mouth every other day. Alternating days with miralax     sodium chloride (OCEAN) 0.65 % SOLN nasal spray Place 1 spray into both nostrils as needed for congestion.     valsartan-hydrochlorothiazide (DIOVAN-HCT) 320-25 MG tablet TAKE 1 TABLET BY MOUTH ONCE A DAY FOR BLOOD PRESSURE 90 tablet 1   aspirin EC 81 MG tablet Take 81 mg by mouth at bedtime.      Results for orders placed or performed during the hospital encounter of 12/16/21 (from the past 48 hour(s))  ABO/Rh     Status: None   Collection Time: 12/16/21  5:54 AM  Result Value Ref Range   ABO/RH(D)      Jenetta Downer NEG Performed at [facility] [address], [place], [place] [address]    No results found.  Review of Systems  Constitutional:  Negative for chills and fatigue.  Neurological:  Positive for tremors.  All other systems reviewed and are negative.   Blood pressure 115/73, pulse 93, temperature 98 F (36.7 C), temperature source Oral, resp. rate 20, height '5\' 7"'$  (1.702 m), weight 111.1 kg, SpO2 97 %. Physical Exam Vitals reviewed.  HENT:     Head: Normocephalic.  Eyes:     Pupils: Pupils are equal, round, and reactive to light.  Cardiovascular:     Rate and Rhythm: Normal rate.     Pulses: Normal pulses.  Abdominal:     General: Abdomen is flat.     Comments: Stable large truncal obesity  Genitourinary:    Comments: No CVAT at present Musculoskeletal:     Cervical back: Normal range of motion.  Skin:     General: Skin is warm.  Neurological:     Mental Status: He is alert.     Comments: Resting and intention tremors, stable.   Psychiatric:        Mood and Affect: Mood normal.      Assessment/Plan  Proceed as planned with RIGHT robotic radical nephrectomy. RIsks, benefits, alternatives, expected peri-op course discussed previously and reiterated today. He understands that his metabolic and neurogic comorbidity increases peri-op risk and chance of prolonged recovery.   Alexis Frock, MD 12/16/2021, 6:42 AM

## 2021-12-17 LAB — BASIC METABOLIC PANEL
Anion gap: 9 (ref 5–15)
BUN: 18 mg/dL (ref 8–23)
CO2: 24 mmol/L (ref 22–32)
Calcium: 9.5 mg/dL (ref 8.9–10.3)
Chloride: 104 mmol/L (ref 98–111)
Creatinine, Ser: 1.78 mg/dL — ABNORMAL HIGH (ref 0.61–1.24)
GFR, Estimated: 41 mL/min — ABNORMAL LOW (ref >60)
Glucose, Bld: 151 mg/dL — ABNORMAL HIGH (ref 70–99)
Potassium: 4 mmol/L (ref 3.5–5.1)
Sodium: 137 mmol/L (ref 135–145)

## 2021-12-17 LAB — GLUCOSE, CAPILLARY
Glucose-Capillary: 146 mg/dL — ABNORMAL HIGH (ref 70–99)
Glucose-Capillary: 170 mg/dL — ABNORMAL HIGH (ref 70–99)
Glucose-Capillary: 154 mg/dL — ABNORMAL HIGH (ref 70–99)
Glucose-Capillary: 132 mg/dL — ABNORMAL HIGH (ref 70–99)

## 2021-12-17 LAB — HEMOGLOBIN AND HEMATOCRIT, BLOOD
HCT: 40.2 % (ref 39.0–52.0)
Hemoglobin: 13.7 g/dL (ref 13.0–17.0)

## 2021-12-17 NOTE — Progress Notes (Signed)
  S: No complaints. Passed flatus and had a watery BM but not walking the halls yet and still on clears.   O: Vitals:   12/17/21 0536 12/17/21 1137  BP: 126/72 134/70  Pulse: 89 67  Resp: 17 18  Temp: 97.7 F (36.5 C) 97.6 F (36.4 C)  SpO2: 99% 98%    Intake/Output Summary (Last 24 hours) at 12/17/2021 1211 Last data filed at 12/17/2021 0930 Gross per 24 hour  Intake 1221.05 ml  Output 2000 ml  Net -778.95 ml   NAD Watching TV and sitting in a chair CV - RRR Resp - CTAB Abd - soft, NT, inc C/D/I, good BS Ext - no calf pain or swelling  Foley - urine clear   A: POD#1 Robotic-assisted laparoscopic right radical nephrectomy.  P: -d/c foley -advance diet as tolerated  -ambulate -home later today or tomorrow

## 2021-12-18 LAB — HEMOGLOBIN AND HEMATOCRIT, BLOOD
HCT: 40.2 % (ref 39.0–52.0)
Hemoglobin: 13.4 g/dL (ref 13.0–17.0)

## 2021-12-18 LAB — BASIC METABOLIC PANEL
Anion gap: 9 (ref 5–15)
BUN: 17 mg/dL (ref 8–23)
CO2: 22 mmol/L (ref 22–32)
Calcium: 9.5 mg/dL (ref 8.9–10.3)
Chloride: 107 mmol/L (ref 98–111)
Creatinine, Ser: 1.82 mg/dL — ABNORMAL HIGH (ref 0.61–1.24)
GFR, Estimated: 40 mL/min — ABNORMAL LOW (ref >60)
Glucose, Bld: 143 mg/dL — ABNORMAL HIGH (ref 70–99)
Potassium: 3.6 mmol/L (ref 3.5–5.1)
Sodium: 138 mmol/L (ref 135–145)

## 2021-12-18 LAB — GLUCOSE, CAPILLARY: Glucose-Capillary: 138 mg/dL — ABNORMAL HIGH (ref 70–99)

## 2021-12-18 NOTE — TOC CM/SW Note (Signed)
  Transition of Care [facility]) Screening Note   Patient Details  Name: LEGEND TUMMINELLO Date of Birth: 1956/04/03   Transition of Care [facility]) CM/SW Contact:    Ross Ludwig, LCSW Phone Number: 12/18/2021, 12:19 PM    Transition of Care Department [facility]) has reviewed patient and no TOC needs have been identified at this time. We will continue to monitor patient advancement through interdisciplinary progression rounds. If new patient transition needs arise, please place a TOC consult.

## 2021-12-18 NOTE — Discharge Summary (Signed)
Physician Discharge Summary  Patient ID: Todd Rice MRN: [id number] DOB/AGE: [birth year] 66 y.o.  Admit date: 12/16/2021 Discharge date: 12/18/2021  Admission Diagnoses:  Discharge Diagnoses:  Principal Problem:   Renal mass   Discharged Condition: good  Hospital Course:66-year-old was admitted following a robotic assisted laparoscopic right nephrectomy.  He has done well.  By postop day 2 he was tolerating regular diet and ambulating.  Pain was controlled.  Voiding well without the catheter.  Consults: None  Significant Diagnostic Studies: none  Treatments: Surgery - Robotic-assisted laparoscopic right radical nephrectomy.  Discharge Exam: Blood pressure (!) 140/76, pulse 78, temperature (!) 97.5 F (36.4 C), temperature source Oral, resp. rate 16, height '5\' 7"'$  (1.702 m), weight 111.1 kg, SpO2 99 %. No acute distress, sitting in chair watching TV, alert and oriented Cardiovascular-regular rate and rhythm Respiratory-clear to auscultation bilaterally Abdomen-soft and nontender, incisions clean dry and intact.  Good bowel sounds. Extremity-no calf pain or swelling  Disposition: Discharge disposition: 01-Home or Self Care       Discharge Instructions     Discharge patient   Complete by: As directed    Discharge disposition: 01-Home or Self Care   Discharge patient date: 12/18/2021      Allergies as of 12/18/2021       Reactions   Ciprofloxacin Hives   Ceftriaxone Hives        Medication List     STOP taking these medications    aspirin EC 81 MG tablet       TAKE these medications    albuterol 108 (90 Base) MCG/ACT inhaler Commonly known as: VENTOLIN HFA Inhale 2 puffs into the lungs every 4 hours (15 minutes apart) for asthma rescue. (INHALE 2 PUFFS EVERY 4 HOURS (15 MINUTES APART) FOR ASTHMA RESCUE)   allopurinol 300 MG tablet Commonly known as: ZYLOPRIM Take 1 tablet (300 mg) by mouth once daily to prevent gout. (TAKE 1 TABLET BY MOUTH ONCE  A DAY TO TO PREVENT GOUT)   ALPRAZolam 0.5 MG tablet Commonly known as: XANAX Take 1 tablet by mouth up to three times daily as needed for severe anxiety or panic attacks; avoid daily use. (Take 1 tab up to three times a day only if needed for severe anxiety or panic attack. Avoid daily use.)   BIOFREEZE EX Apply 1 Application topically daily as needed (pain).   calcium carbonate 500 MG chewable tablet Commonly known as: TUMS - dosed in mg elemental calcium Chew 1 tablet by mouth daily as needed for indigestion or heartburn.   carbidopa-levodopa 25-250 MG tablet Commonly known as: SINEMET IR TAKE 1 TABLET BY MOUTH 3 TIMES DAILY What changed: additional instructions   ezetimibe 10 MG tablet Commonly known as: Zetia Take 1 tablet by mouth daily for cholesterol. (Take  1 tablet  Daily  for Cholesterol)   fosfomycin 3 g Pack Commonly known as: Monurol Take 1 packet by mouth today and repeat on Thursday. (1 packet PO Begin today and repeat on Thursday)   glipiZIDE 5 MG tablet Commonly known as: Glucotrol Take 0.5 tablet by mouth as needed for glucose above 150. (Take 1/2 tab as needed for glucose above 150.) What changed:  how much to take how to take this when to take this additional instructions   HYDROcodone-acetaminophen 5-325 MG tablet Commonly known as: Norco Take 1-2 tablets by mouth every 6 (six) hours as needed for moderate pain or severe pain.   Magnesium 250 MG Tabs Take 1 tablet (250 mg total)  by mouth 2 (two) times daily with a meal. What changed: when to take this   metFORMIN 500 MG 24 hr tablet Commonly known as: GLUCOPHAGE-XR Take 2 tablets by mouth twice daily. (TAKE 2 TABLETS BY MOUTH 2 TIMES DAILY)   Ozempic (0.25 or 0.5 MG/DOSE) 2 MG/3ML Sopn Generic drug: Semaglutide(0.25 or 0.'5MG'$ /DOS) Inject 0.25 mg into the skin once weekly for 4 weeks, if tolerating well may increase to 0.5 mg weekly. (Start by injecting 0.25 mg into skin of stomach once  weekly; if doing well increase to 0.5 mg in 4 weeks and continue at this dose.)   polyethylene glycol powder 17 GM/SCOOP powder Commonly known as: GLYCOLAX/MIRALAX Take 17 g by mouth every other day. Alternating days with senna   pramipexole 1 MG tablet Commonly known as: MIRAPEX Take 1 tablet (1 mg total) by mouth 3 (three) times daily.   rosuvastatin 20 MG tablet Commonly known as: CRESTOR Take 1 tablet (20 mg total) by mouth daily.   senna 8.6 MG tablet Commonly known as: SENOKOT Take 2 tablets by mouth every other day. Alternating days with miralax   sodium chloride 0.65 % Soln nasal spray Commonly known as: OCEAN Place 1 spray into both nostrils as needed for congestion.   valsartan-hydrochlorothiazide 320-25 MG tablet Commonly known as: DIOVAN-HCT TAKE 1 TABLET BY MOUTH ONCE A DAY FOR BLOOD PRESSURE   Vitamin D 50 MCG (2000 UT) tablet Take 2,000 Units by mouth 2 (two) times daily.        Follow-up Information     Todd Frock, MD Follow up on 01/02/2022.   Specialty: Urology Why: at 9:15 for MD visit and pathology reveiw. Contact information: [place] [address] [phone]                 Signed: Festus Aloe 12/18/2021, 10:44 AM

## 2021-12-19 ENCOUNTER — Telehealth: Payer: Self-pay

## 2021-12-19 ENCOUNTER — Encounter (HOSPITAL_COMMUNITY): Payer: Self-pay | Admitting: Urology

## 2021-12-19 ENCOUNTER — Other Ambulatory Visit (HOSPITAL_BASED_OUTPATIENT_CLINIC_OR_DEPARTMENT_OTHER): Payer: Self-pay

## 2021-12-19 LAB — SURGICAL PATHOLOGY

## 2021-12-19 NOTE — Telephone Encounter (Signed)
LM-12/19/21-Pt. has no questions or concerns regarding surgery. Pt. stated he tolerated the surgery well, Pt. stated he is able to eat, drink, ambulate, and is driving (w/ dr. permission per pt.), Pt. pain is currently controlled per pt. Pt. was prescribed pain medication, but pt. stated he doesn't feel like he needed it but got it in case pain increases. Advised pt. to get rest, and stay hydrated. Pt. verbalized understanding and agreed. Pt. has f/u w/ his surgeon Dr. Tammi Klippel on 8/29.  Total time spent: 15 min.

## 2021-12-27 ENCOUNTER — Telehealth: Payer: Self-pay

## 2021-12-27 NOTE — Telephone Encounter (Signed)
LM-12/27/21-Called pt. To check in post-op and ensure he is having Bms and bowel regimen and ask about any pain.Spoke to pt. And he reported that he has no pain and never had to take any opioid pain medication. Asked pt. About his bowel regimen and he informed me he has had BM 1-2x daily that are formed and solid. Pt. Denied constipation or diarrhea. Advised pt. To call if he has any changes in health or has any concerns. Pt. Verbalized understanding and agreed.  Total time spent: 6 min.

## 2022-01-02 DIAGNOSIS — Z905 Acquired absence of kidney: Secondary | ICD-10-CM | POA: Diagnosis not present

## 2022-01-05 DIAGNOSIS — E1169 Type 2 diabetes mellitus with other specified complication: Secondary | ICD-10-CM | POA: Diagnosis not present

## 2022-01-05 DIAGNOSIS — I10 Essential (primary) hypertension: Secondary | ICD-10-CM | POA: Diagnosis not present

## 2022-01-05 DIAGNOSIS — E785 Hyperlipidemia, unspecified: Secondary | ICD-10-CM | POA: Diagnosis not present

## 2022-01-09 DIAGNOSIS — G4733 Obstructive sleep apnea (adult) (pediatric): Secondary | ICD-10-CM | POA: Diagnosis not present

## 2022-01-11 DIAGNOSIS — G4733 Obstructive sleep apnea (adult) (pediatric): Secondary | ICD-10-CM | POA: Diagnosis not present

## 2022-01-17 ENCOUNTER — Other Ambulatory Visit (HOSPITAL_BASED_OUTPATIENT_CLINIC_OR_DEPARTMENT_OTHER): Payer: Self-pay

## 2022-01-23 ENCOUNTER — Encounter: Payer: Self-pay | Admitting: Neurology

## 2022-02-07 NOTE — Progress Notes (Unsigned)
PATIENT: Todd Rice DOB: November 22, 1955  REASON FOR VISIT: follow up HISTORY FROM: patient PRIMARY NEUROLOGIST: Dr. Vickey Huger  Chief Complaint  Patient presents with   Follow-up    Rm 19, alone.  CPAP.  Shaved beard, had issue with mask leak, but now is resolved.       HISTORY OF PRESENT ILLNESS: Today 02/08/22: Todd Rice is a 66 year old male with a history of obstructive sleep apnea on CPAP and Parkinson's disease.  He returns today for follow-up.No changes with gait or balance. Some days are better than others. Has PT regularly. No trouble chewing or swallowing food. Has noticed 2 dreams that are very vivid. Tremor remains in the right arm- stable.  Remains on Sinemet 25-250 mg 3 times a day and Mirapex 1 mg 3 times a day.  He reports that he shaved his beard that his mask is fitting better.  This just occurred in the last 1 to 2 weeks.  Current download still shows a significant leak but some of this data was prior to him shaving his beard  Had cancer and right kidney was removed.     08/04/21: Todd Rice is a 66 year old male with a history of patient seen today for initial BiPAP compliance visit after receiving a new machine.  He was just seen in January for evaluation of Parkinson's.  His BiPAP report is below.  He reports he does feel his mask leaking.  He reports that it may be because of his beard.    06/07/21:   Todd Rice is a 66 year old male with a history of Parkinson's disease.  He returns today for follow-up.  The patient continues to have a tremor in the right arm.  He states it is worse with stress.  Reports that his balance is a little off.  He is going to therapy.  Has an evaluation every 6 months.  His next evaluation is March 6.  Denies any trouble with his swallowing.  Currently has a new CPAP machine.  Reports that he is sleeping on average about 5 hours most nights.  He has an appointment next month for initial CPAP compliance.     REVIEW OF  SYSTEMS: Out of a complete 14 system review of symptoms, the patient complains only of the following symptoms, and all other reviewed systems are negative.  FSS 11 ESS1   ALLERGIES: Allergies  Allergen Reactions   Ciprofloxacin Hives   Ceftriaxone Hives    HOME MEDICATIONS: Outpatient Medications Prior to Visit  Medication Sig Dispense Refill   albuterol (VENTOLIN HFA) 108 (90 Base) MCG/ACT inhaler INHALE 2 PUFFS EVERY 4 HOURS (15 MINUTES APART) FOR ASTHMA RESCUE 6.7 g 1   allopurinol (ZYLOPRIM) 300 MG tablet TAKE 1 TABLET BY MOUTH ONCE A DAY TO TO PREVENT GOUT 90 tablet 3   ALPRAZolam (XANAX) 0.5 MG tablet Take 1 tab up to three times a day only if needed for severe anxiety or panic attack. Avoid daily use. 90 tablet 0   calcium carbonate (TUMS - DOSED IN MG ELEMENTAL CALCIUM) 500 MG chewable tablet Chew 1 tablet by mouth daily as needed for indigestion or heartburn.     carbidopa-levodopa (SINEMET IR) 25-250 MG tablet TAKE 1 TABLET BY MOUTH 3 TIMES DAILY (Patient taking differently: Take 1 tablet by mouth 3 (three) times daily. PT states takes at 0630am, 1230pm and 630pm at nite.  PT wants to stay on schedule within 30 minutes of these times at all possible.) 270 tablet 3  Cholecalciferol (VITAMIN D) 50 MCG (2000 UT) tablet Take 2,000 Units by mouth 2 (two) times daily.     ezetimibe (ZETIA) 10 MG tablet Take  1 tablet  Daily  for Cholesterol 90 tablet 3   fosfomycin (MONUROL) 3 g PACK 1 packet PO Begin today and repeat on Thursday 2 each 0   glipiZIDE (GLUCOTROL) 5 MG tablet Take 1/2 tab as needed for glucose above 150. (Patient taking differently: Take 2.5-5 mg by mouth See admin instructions. Take 2.5 mg as needed for glucose above 150-199, take 5 mg as needed for glucose 200 or higher) 60 tablet 2   HYDROcodone-acetaminophen (NORCO) 5-325 MG tablet Take 1-2 tablets by mouth every 6 (six) hours as needed for moderate pain or severe pain. 20 tablet 0   Magnesium 250 MG TABS Take 1  tablet (250 mg total) by mouth 2 (two) times daily with a meal. (Patient taking differently: Take 1 tablet by mouth daily.)     Menthol, Topical Analgesic, (BIOFREEZE EX) Apply 1 Application topically daily as needed (pain).     metFORMIN (GLUCOPHAGE-XR) 500 MG 24 hr tablet TAKE 2 TABLETS BY MOUTH 2 TIMES DAILY 360 tablet 3   polyethylene glycol powder (GLYCOLAX/MIRALAX) 17 GM/SCOOP powder Take 17 g by mouth every other day. Alternating days with senna     pramipexole (MIRAPEX) 1 MG tablet Take 1 tablet (1 mg total) by mouth 3 (three) times daily. 270 tablet 3   rosuvastatin (CRESTOR) 20 MG tablet Take 1 tablet (20 mg total) by mouth daily. 90 tablet 3   Semaglutide,0.25 or 0.5MG /DOS, (OZEMPIC, 0.25 OR 0.5 MG/DOSE,) 2 MG/3ML SOPN Start by injecting 0.25 mg into skin of stomach once weekly; if doing well increase to 0.5 mg in 4 weeks and continue at this dose. 3 mL 1   senna (SENOKOT) 8.6 MG tablet Take 2 tablets by mouth every other day. Alternating days with miralax     sodium chloride (OCEAN) 0.65 % SOLN nasal spray Place 1 spray into both nostrils as needed for congestion.     valsartan-hydrochlorothiazide (DIOVAN-HCT) 320-25 MG tablet TAKE 1 TABLET BY MOUTH ONCE A DAY FOR BLOOD PRESSURE 90 tablet 1   Facility-Administered Medications Prior to Visit  Medication Dose Route Frequency Provider Last Rate Last Admin   polyethylene glycol powder (GLYCOLAX/MIRALAX) container 255 g  1 Container Oral Once Sebastian Ache, MD        PAST MEDICAL HISTORY: Past Medical History:  Diagnosis Date   Adult BMI 50.0-59.9 kg/sq m    52.77   Anal fissure    Cancer (HCC)    kidney cancer   Diabetes mellitus without complication (HCC)    borderline   Elevated PSA 08/13/2019   H/O: gout    STABLE   Hepatic steatosis 10/01/2011   History of kidney stones    Hyperlipidemia    Hypertension    Kidney tumor    right upper kidney tumor, monitoring   OSA on CPAP    [employer] [facility]   Parkinson disease  01/04/2016   REM sleep behavior disorder 12/06/2016   Right ureteral stone    Ureteral calculus, right 06/12/2011   Vitamin D deficiency    Weakness     PAST SURGICAL HISTORY: Past Surgical History:  Procedure Laterality Date   ANAL FISSURE REPAIR  10-12-2000   COLONOSCOPY N/A 07/01/2012   Procedure: COLONOSCOPY;  Surgeon: Hart Carwin, MD;  Location: [facility];  Service: Endoscopy;  Laterality: N/A;   CYSTOSCOPY WITH RETROGRADE PYELOGRAM,  URETEROSCOPY AND STENT PLACEMENT Bilateral 02/26/2013   Procedure: CYSTOSCOPY WITH RETROGRADE PYELOGRAM, URETEROSCOPY AND STENT PLACEMENT;  Surgeon: Sebastian Ache, MD;  Location: [facility];  Service: Urology;  Laterality: Bilateral;   HOLMIUM LASER APPLICATION Bilateral 02/26/2013   Procedure: HOLMIUM LASER APPLICATION;  Surgeon: Sebastian Ache, MD;  Location: [facility];  Service: Urology;  Laterality: Bilateral;   kidney stone removal     06/2011-stent placement   LEFT MEDIAL FEMORAL CONDYLE DEBRIDEMENT & DRILLING/ REMOVAL LOOSE BODY  09-15-2003   NASAL SEPTUM SURGERY  1985   ROBOT ASSISTED LAPAROSCOPIC NEPHRECTOMY Right 12/16/2021   Procedure: XI ROBOTIC ASSISTED LAPAROSCOPIC RADICAL NEPHRECTOMY;  Surgeon: Sebastian Ache, MD;  Location: [facility];  Service: Urology;  Laterality: Right;  3 HRS   STONE EXTRACTION WITH BASKET  06/12/2011   Procedure: STONE EXTRACTION WITH BASKET;  Surgeon: Garnett Farm, MD;  [facility]: [facility];  Service: Urology;  Laterality: Right;    FAMILY HISTORY: Family History  Problem Relation Age of Onset   Heart disease Mother        Atrial fibrillation   Hypertension Mother    Hyperlipidemia Mother    Lung cancer Father        was a smoker   Cancer Father        lung   Hyperlipidemia Brother    Melanoma Brother    Heart disease Brother        Amyloid   Heart disease Maternal Aunt    Hyperlipidemia Maternal Aunt    Hypertension Maternal Aunt    Stroke Maternal Aunt    Heart disease Paternal  Grandmother    Hyperlipidemia Paternal Grandmother    Parkinson's disease Neg Hx    Sleep apnea Neg Hx     SOCIAL HISTORY: Social History   Socioeconomic History   Marital status: Married    Spouse name: Dawn   Number of children: 0   Years of education: Not on file   Highest education level: Not on file  Occupational History    Comment: retired  Tobacco Use   Smoking status: Former   Smokeless tobacco: Never  Building services engineer Use: Never used  Substance and Sexual Activity   Alcohol use: No   Drug use: No    Comment: QUIT SMOKING " POT" 40 YRS AGO   Sexual activity: Yes    Partners: Female    Birth control/protection: Post-menopausal  Other Topics Concern   Not on file  Social History Narrative   Right-handed.   No caffeine use.   Lives at home with his wife.   Social Determinants of Health   Financial Resource Strain: Not on file  Food Insecurity: Not on file  Transportation Needs: Not on file  Physical Activity: Not on file  Stress: Not on file  Social Connections: Not on file  Intimate Partner Violence: Not on file      PHYSICAL EXAM  Vitals:   02/08/22 0814  BP: 116/75  Pulse: 78  Weight: 241 lb 12.8 oz (109.7 kg)  Height: 5\' 7"  (1.702 m)   Body mass index is 37.87 kg/m.  Generalized: Well developed, in no acute distress  Chest: Lungs clear to auscultation bilaterally  Neurological examination  Mentation: Alert oriented to time, place, history taking. Follows all commands speech and language fluent Cranial nerve II-XII: Extraocular movements were full, visual field were full on confrontational test Head turning and shoulder shrug  were normal and symmetric. Motor: The motor testing reveals 5  over 5 strength of all 4 extremities. Good symmetric motor tone is noted throughout. Resting tremor noted in right hand. Finger taps- mild bilaterally, toe taps- moderate impairment bilaterally. Sensory: Sensory testing is intact to soft touch on all 4  extremities. No evidence of extinction is noted.  Gait and station: Gait is normal.    DIAGNOSTIC DATA (LABS, IMAGING, TESTING) - I reviewed patient records, labs, notes, testing and imaging myself where available.  Lab Results  Component Value Date   WBC 8.3 12/07/2021   HGB 13.4 12/18/2021   HCT 40.2 12/18/2021   MCV 85.7 12/07/2021   PLT 185 12/07/2021      Component Value Date/Time   NA 138 12/18/2021 0525   K 3.6 12/18/2021 0525   CL 107 12/18/2021 0525   CO2 22 12/18/2021 0525   GLUCOSE 143 (H) 12/18/2021 0525   BUN 17 12/18/2021 0525   CREATININE 1.82 (H) 12/18/2021 0525   CREATININE 1.26 09/20/2021 1704   CALCIUM 9.5 12/18/2021 0525   PROT 7.6 09/20/2021 1704   ALBUMIN 3.8 10/24/2016 1601   AST 18 09/20/2021 1704   ALT 12 09/20/2021 1704   ALKPHOS 58 10/24/2016 1601   BILITOT 0.5 09/20/2021 1704   GFRNONAA 40 (L) 12/18/2021 0525   GFRNONAA 58 (L) 09/27/2020 1008   GFRAA 68 09/27/2020 1008   Lab Results  Component Value Date   CHOL 121 09/20/2021   HDL 34 (L) 09/20/2021   LDLCALC 59 09/20/2021   TRIG 228 (H) 09/20/2021   CHOLHDL 3.6 09/20/2021   Lab Results  Component Value Date   HGBA1C 6.6 (H) 12/07/2021   Lab Results  Component Value Date   VITAMINB12 667 09/26/2018   Lab Results  Component Value Date   TSH 2.60 09/20/2021      ASSESSMENT AND PLAN 66 y.o. year old male  has a past medical history of Adult BMI 50.0-59.9 kg/sq m, Anal fissure, Cancer (HCC), Diabetes mellitus without complication (HCC), Elevated PSA (08/13/2019), H/O: gout, Hepatic steatosis (10/01/2011), History of kidney stones, Hyperlipidemia, Hypertension, Kidney tumor, OSA on CPAP, Parkinson disease (01/04/2016), REM sleep behavior disorder (12/06/2016), Right ureteral stone, Ureteral calculus, right (06/12/2011), Vitamin D deficiency, and Weakness. here with:  OSA on BiPAP  - CPAP compliance excellent - Residual AHI slightly elevated most likely due to leak.  This should  improve since he shaved his beard - Encourage patient to use CPAP nightly and > 4 hours each night  2.  Parkinson's disease  -Continue Sinemet 25-250 mg 1 tablet 3 times a day -Continue Mirapex 1 mg 3 times a day  - F/U in 6 months or sooner if needed    Butch Penny, MSN, NP-C 02/08/2022, 8:42 AM [facility] [address] [place], [place] [address] [phone]

## 2022-02-08 ENCOUNTER — Encounter: Payer: Self-pay | Admitting: Adult Health

## 2022-02-08 ENCOUNTER — Ambulatory Visit: Payer: Medicare Other | Admitting: Adult Health

## 2022-02-08 VITALS — BP 116/75 | HR 78 | Ht 67.0 in | Wt 241.8 lb

## 2022-02-08 DIAGNOSIS — G20A1 Parkinson's disease without dyskinesia, without mention of fluctuations: Secondary | ICD-10-CM

## 2022-02-08 DIAGNOSIS — G4733 Obstructive sleep apnea (adult) (pediatric): Secondary | ICD-10-CM | POA: Diagnosis not present

## 2022-02-10 ENCOUNTER — Other Ambulatory Visit (HOSPITAL_BASED_OUTPATIENT_CLINIC_OR_DEPARTMENT_OTHER): Payer: Self-pay

## 2022-02-10 MED ORDER — INFLUENZA VAC A&B SA ADJ QUAD 0.5 ML IM PRSY
PREFILLED_SYRINGE | INTRAMUSCULAR | 0 refills | Status: DC
  Filled 2022-02-10: qty 0.5, 1d supply, fill #0

## 2022-02-15 ENCOUNTER — Other Ambulatory Visit (HOSPITAL_BASED_OUTPATIENT_CLINIC_OR_DEPARTMENT_OTHER): Payer: Self-pay

## 2022-02-15 MED ORDER — COVID-19 MRNA 2023-2024 VACCINE (COMIRNATY) 0.3 ML INJECTION
INTRAMUSCULAR | 0 refills | Status: DC
  Filled 2022-02-15: qty 0.3, 1d supply, fill #0

## 2022-02-27 ENCOUNTER — Other Ambulatory Visit (HOSPITAL_BASED_OUTPATIENT_CLINIC_OR_DEPARTMENT_OTHER): Payer: Self-pay

## 2022-02-27 ENCOUNTER — Encounter: Payer: Self-pay | Admitting: Internal Medicine

## 2022-03-02 ENCOUNTER — Ambulatory Visit: Payer: Medicare Other | Admitting: Neurology

## 2022-03-09 ENCOUNTER — Telehealth: Payer: Self-pay

## 2022-03-09 NOTE — Telephone Encounter (Signed)
LM-03/10/22-Called pt. And informed him CP visit is canceled and will call to r/s and completed General Review Call.  Total time spent: 18 min.      LM-03/09/22-Chart review started for upcoming Pharmacist Focused Outreach on 11/7. Reviewing OV, Consults, Hospital visits, Labs and medication changes.  Chart review complete.Will call patient to confirm visit at another time.  Total time spent:16 min.

## 2022-03-15 ENCOUNTER — Other Ambulatory Visit (HOSPITAL_BASED_OUTPATIENT_CLINIC_OR_DEPARTMENT_OTHER): Payer: Self-pay

## 2022-03-15 ENCOUNTER — Other Ambulatory Visit: Payer: Self-pay | Admitting: Internal Medicine

## 2022-03-15 MED ORDER — EZETIMIBE 10 MG PO TABS
ORAL_TABLET | ORAL | 3 refills | Status: DC
Start: 2022-03-15 — End: 2023-05-25
  Filled 2022-03-15: qty 90, 90d supply, fill #0
  Filled 2022-09-05: qty 90, 90d supply, fill #1
  Filled 2022-12-04: qty 90, 90d supply, fill #2
  Filled 2023-02-28 – 2023-03-01 (×2): qty 90, 90d supply, fill #3

## 2022-03-28 ENCOUNTER — Encounter: Payer: Self-pay | Admitting: Nurse Practitioner

## 2022-03-28 ENCOUNTER — Ambulatory Visit (INDEPENDENT_AMBULATORY_CARE_PROVIDER_SITE_OTHER): Payer: Medicare Other | Admitting: Nurse Practitioner

## 2022-03-28 VITALS — BP 104/60 | HR 68 | Temp 97.7°F | Ht 65.0 in | Wt 244.2 lb

## 2022-03-28 DIAGNOSIS — Z0000 Encounter for general adult medical examination without abnormal findings: Secondary | ICD-10-CM | POA: Diagnosis not present

## 2022-03-28 DIAGNOSIS — J4520 Mild intermittent asthma, uncomplicated: Secondary | ICD-10-CM

## 2022-03-28 DIAGNOSIS — Z79899 Other long term (current) drug therapy: Secondary | ICD-10-CM

## 2022-03-28 DIAGNOSIS — G20A1 Parkinson's disease without dyskinesia, without mention of fluctuations: Secondary | ICD-10-CM

## 2022-03-28 DIAGNOSIS — E1169 Type 2 diabetes mellitus with other specified complication: Secondary | ICD-10-CM | POA: Diagnosis not present

## 2022-03-28 DIAGNOSIS — C641 Malignant neoplasm of right kidney, except renal pelvis: Secondary | ICD-10-CM

## 2022-03-28 DIAGNOSIS — Z136 Encounter for screening for cardiovascular disorders: Secondary | ICD-10-CM

## 2022-03-28 DIAGNOSIS — Z125 Encounter for screening for malignant neoplasm of prostate: Secondary | ICD-10-CM

## 2022-03-28 DIAGNOSIS — I700 Atherosclerosis of aorta: Secondary | ICD-10-CM

## 2022-03-28 DIAGNOSIS — N183 Chronic kidney disease, stage 3 unspecified: Secondary | ICD-10-CM | POA: Diagnosis not present

## 2022-03-28 DIAGNOSIS — Z0001 Encounter for general adult medical examination with abnormal findings: Secondary | ICD-10-CM

## 2022-03-28 DIAGNOSIS — J302 Other seasonal allergic rhinitis: Secondary | ICD-10-CM

## 2022-03-28 DIAGNOSIS — K5900 Constipation, unspecified: Secondary | ICD-10-CM

## 2022-03-28 DIAGNOSIS — Z2239 Carrier of other specified bacterial diseases: Secondary | ICD-10-CM

## 2022-03-28 DIAGNOSIS — E559 Vitamin D deficiency, unspecified: Secondary | ICD-10-CM

## 2022-03-28 DIAGNOSIS — E1122 Type 2 diabetes mellitus with diabetic chronic kidney disease: Secondary | ICD-10-CM | POA: Diagnosis not present

## 2022-03-28 DIAGNOSIS — I10 Essential (primary) hypertension: Secondary | ICD-10-CM | POA: Diagnosis not present

## 2022-03-28 DIAGNOSIS — G4752 REM sleep behavior disorder: Secondary | ICD-10-CM

## 2022-03-28 DIAGNOSIS — G4733 Obstructive sleep apnea (adult) (pediatric): Secondary | ICD-10-CM

## 2022-03-28 DIAGNOSIS — I2510 Atherosclerotic heart disease of native coronary artery without angina pectoris: Secondary | ICD-10-CM

## 2022-03-28 NOTE — Progress Notes (Signed)
CPE  Assessment and Plan:   CPE Due annually  Health maintenance reviewed  Atherosclerosis of aorta ([place]) - per CXR 06/2018 Control blood pressure, cholesterol, glucose, increase exercise.   Coronary artery disease involving native coronary artery of native heart without angina pectoris Control blood pressure, cholesterol, glucose, increase exercise.  Go to the ER if any chest pain, shortness of breath, nausea, dizziness, severe HA, changes vision/speech  Essential hypertension Discussed DASH (Dietary Approaches to Stop Hypertension) DASH diet is lower in sodium than a typical American diet. Cut back on foods that are high in saturated fat, cholesterol, and trans fats. Eat more whole-grain foods, fish, poultry, and nuts Remain active and exercise as tolerated daily.  Monitor BP at home-Call if greater than 130/80.  Check CMP/CBC   Controlled type 2 diabetes mellitus with diabetic dermatitis, without long-term current use of insulin [facility]) Education: Reviewed 'ABCs' of diabetes management  Discussed goals to be met and/or maintained include A1C (<7) Blood pressure (<130/80) Cholesterol (LDL <70) Continue Eye Exam yearly  Continue Dental Exam Q6 mo Discussed dietary recommendations Discussed Physical Activity recommendations Foot exam UTD Check A1C   Hyperlipidemia associated with T2DM ([place]) Discussed lifestyle modifications. Recommended diet heavy in fruits and veggies, omega 3's. Decrease consumption of animal meats, cheeses, and dairy products. Remain active and exercise as tolerated. Continue to monitor. Check lipids/TSH   CKD III associated with T2DM ([place]) Discussed how what you eat and drink can aide in kidney protection. Stay well hydrated. Avoid high salt foods. Avoid NSAIDS. Keep BP and BG well controlled.   Take medications as prescribed. Remain active and exercise as tolerated daily. Maintain weight.  Continue to monitor. Check  CMP/GFR/Microablumin   Class 3 obesity due to excess calories with serious comorbidity and body mass index (BMI) of 40.0 to 44.9 in adult [facility])  Discussed appropriate BMI Goal of losing 1 lb per month. Diet modification. Physical activity. Encouraged/praised to build confidence.  Parkinson disease [facility]) Continue neuro  PT routinely as needed with benefit; doing exercises at home;  Has 6 mo evaluation with PT on 04/19/22 Will return in 06/2021 OT  Medication management All medications discussed and reviewed in full. All questions and concerns regarding medications addressed.     Vitamin D deficiency Continue supplement; goal 60-100 Check and monitor levels  Seasonal and perennial allergic rhinitis Controlled Avoid triggers Allegra OTC, increase H20, allergy hygiene explained.  Asthma, mild intermittent, well-controlled Weight loss, PRN albuterol, avoid triggers  Right renal cell carcinoma (HCC) Dr. Tresa Moore following, active monitoring  Pseudomonas aeruginosa colonization Monitor, urology following Recently est with ID Dr. Tommy Medal for IV treatment if symptomatic   OSA treated with BiPAP Dr. Brett Fairy following- continue BiPAP, helping with daytime fatigue, weight loss still advised.   REM sleep behavior disorder continue neuro follow up  Major depression in remission ([place]) In remission off of medication; monitor   Constipation Well managed alternating miralax and senokot Increase frequency if needed  Screening for prostate cancer Check and monitor PSA  Orders Placed This Encounter  Procedures   CBC with Differential/Platelet   COMPLETE METABOLIC PANEL WITH GFR   Lipid panel   Hemoglobin A1c   VITAMIN D 25 Hydroxy (Vit-D Deficiency, Fractures)   Vitamin B12   PSA   EKG 12-Lead    Notify office for further evaluation and treatment, questions or concerns if any reported s/s fail to improve.   The patient was advised to call back or seek an in-person  evaluation if any  symptoms worsen or if the condition fails to improve as anticipated.   Further disposition pending results of labs. Discussed med's effects and SE's.    I discussed the assessment and treatment plan with the patient. The patient was provided an opportunity to ask questions and all were answered. The patient agreed with the plan and demonstrated an understanding of the instructions.  Discussed med's effects and SE's. Screening labs and tests as requested with regular follow-up as recommended.  I provided 35 minutes of face-to-face time during this encounter including counseling, chart review, and critical decision making was preformed.   Future Appointments  Date Time Provider [place]  04/20/2022 11:45 AM Sharen Counter CCC-SLP [facility]  04/20/2022 12:15 PM Frazier Butt, PT [facility]  06/20/2022  7:15 AM Delton [place], OT [facility]  09/05/2022  8:30 AM Ward Givens, NP [facility] None  09/21/2022  4:00 PM Darrol Jump, NP [facility] None  04/02/2023  3:00 PM Darrol Jump, NP [facility] None     HPI 66 y.o. male patient presents for AWV and 3 month follow up. He has CAD (coronary artery disease); Essential hypertension; Morbid obesity with BMI of 40.0-44.9, adult ([place]); Pseudomonas aeruginosa colonization; Seasonal and perennial allergic rhinitis; Asthma, mild intermittent, well-controlled; Parkinson's disease; CKD stage 3 due to type 2 diabetes mellitus ([place]); Medication management; Vitamin D deficiency; REM sleep behavior disorder; OSA treated with BiPAP; Type 2 diabetes mellitus with stage 3a chronic kidney disease, without long-term current use of insulin ([place]); Aortic atherosclerosis ([place]) by CXR on 06/10/2018; Hyperlipidemia associated with type 2 diabetes mellitus ([place]); Central sleep apnea due to Cheyne-Stokes respiration; Major depression in remission ([place]); Primary renal papillary carcinoma, right ([place]); Hepatic steatosis; Gout;  Constipation; and Renal mass on their problem list.  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 Accompanied by his wife.   He has depression in remission off of medication (wellbutrin). He has xanax that he uses occasionally, typically 2-3 tabs per month. Rare use of ambien for sleep (prescribed by Dr. Brett Fairy).   He is on BiPAP for OSA/central sleep apnea per Dr. Brett Fairy, endorses 100% compliance and restorative sleep.   Also has R renal papillary carcinoma, Follows annually with Dr. Tresa Moore, has been stable for several years. Had nephrectomy 12/2021.  He has hx of chronic pseudomonas bladder colonization, now est with Dr. Tommy Medal so if recurrent UTI sx planning on IV abx.   Follows with neurology Dr. [name] for Parkinson's. He reports that he has been taking the mirapex and sinemet and feels well controlled. Does continue to have significant L hand tremor. Just finished up with PT earlier this month, now working with OT twice a week.   Dr. [name] retiring, req referral to Dr. Carles Collet.   He is on miralax and senokot once a day for constipation and reports good results.   Has mild asthma, has albuterol uses q2-3 days PRN if needed when outdoors and ragweed season.    BMI is Body mass index is 40.64 kg/m., he has been working on , diet and exercise, strength exercises and walking several times a week. He continues with OT/PT. Wt Readings from Last 3 Encounters:  03/28/22 244 lb 3.2 oz (110.8 kg)  02/08/22 241 lb 12.8 oz (109.7 kg)  12/16/21 245 lb (111.1 kg)   Aortic atherosclerosis per CXR 2020. Normal stress test 2013 by Dr. Percival Spanish due to CAD per CT.  His blood pressure has been controlled at home, today their BP is BP:  104/60  He does workout. He denies chest pain, shortness of breath, dizziness.  He is on cholesterol medication (zetia 10 mg daily, rosuvastatin 20 mg daily, didn't tolerate 40 mg daily) and denies myalgias. His cholesterol is not at goal of 70 or less. The cholesterol last  visit was:   Lab Results  Component Value Date   CHOL 121 09/20/2021   HDL 34 (L) 09/20/2021   LDLCALC 59 09/20/2021   TRIG 228 (H) 09/20/2021   CHOLHDL 3.6 09/20/2021   He has been working on diet and exercise for diabetes CKD, he is on ARB Hyperlipemia on crestor Aortic atherosclerosis he is on bASA he is on metformin 4 a day  Only takes glipizide if his sugar is 150+ in AM takes 1/2, 200+ takes whole if 200+ His sugars have been running 150s, rare up to 175 Was improved with ozempic 0.25 mg weekly (sample) - denies SE Eye Exam has scheduled 10/06/2020, Dr. Sherral Hammers denies foot ulcerations, hyperglycemia, hypoglycemia , increased appetite, nausea, paresthesia of the feet, polydipsia, polyuria, visual disturbances, vomiting and weight loss.  Last A1C in the office was:  Lab Results  Component Value Date   HGBA1C 6.6 (H) 12/07/2021   He has CKD III associated with T2DM monitored at this office, was slightly lower last check:  Lab Results  Component Value Date   EGFR 63 09/20/2021   Patient is on Vitamin D supplement.   Lab Results  Component Value Date   VD25OH 64 03/24/2021   Hx of PSA elevation, follows with Dr. Tresa Moore. Denies LUTS.  Lab Results  Component Value Date   PSA 2.69 09/20/2021      Current Medications:   Current Outpatient Medications (Endocrine & Metabolic):    glipiZIDE (GLUCOTROL) 5 MG tablet, Take 1/2 tab as needed for glucose above 150. (Patient taking differently: Take 2.5-5 mg by mouth See admin instructions. Take 2.5 mg as needed for glucose above 150-199, take 5 mg as needed for glucose 200 or higher)   metFORMIN (GLUCOPHAGE-XR) 500 MG 24 hr tablet, TAKE 2 TABLETS BY MOUTH 2 TIMES DAILY   Semaglutide,0.25 or 0.5MG/DOS, (OZEMPIC, 0.25 OR 0.5 MG/DOSE,) 2 MG/3ML SOPN, Start by injecting 0.25 mg into skin of stomach once weekly; if doing well increase to 0.5 mg in 4 weeks and continue at this dose. (Patient not taking: Reported on 03/28/2022)   Current  Outpatient Medications (Cardiovascular):    ezetimibe (ZETIA) 10 MG tablet, Take  1 tablet  Daily  for Cholesterol   rosuvastatin (CRESTOR) 20 MG tablet, Take 1 tablet (20 mg total) by mouth daily.   valsartan-hydrochlorothiazide (DIOVAN-HCT) 320-25 MG tablet, TAKE 1 TABLET BY MOUTH ONCE A DAY FOR BLOOD PRESSURE   Current Outpatient Medications (Respiratory):    albuterol (VENTOLIN HFA) 108 (90 Base) MCG/ACT inhaler, INHALE 2 PUFFS EVERY 4 HOURS (15 MINUTES APART) FOR ASTHMA RESCUE   sodium chloride (OCEAN) 0.65 % SOLN nasal spray, Place 1 spray into both nostrils as needed for congestion.   Current Outpatient Medications (Analgesics):    allopurinol (ZYLOPRIM) 300 MG tablet, TAKE 1 TABLET BY MOUTH ONCE A DAY TO TO PREVENT GOUT   HYDROcodone-acetaminophen (NORCO) 5-325 MG tablet, Take 1-2 tablets by mouth every 6 (six) hours as needed for moderate pain or severe pain. (Patient not taking: Reported on 03/28/2022)     Current Outpatient Medications (Other):    ALPRAZolam (XANAX) 0.5 MG tablet, Take 1 tab up to three times a day only if needed for severe anxiety or  panic attack. Avoid daily use.   calcium carbonate (TUMS - DOSED IN MG ELEMENTAL CALCIUM) 500 MG chewable tablet, Chew 1 tablet by mouth daily as needed for indigestion or heartburn.   carbidopa-levodopa (SINEMET IR) 25-250 MG tablet, TAKE 1 TABLET BY MOUTH 3 TIMES DAILY (Patient taking differently: Take 1 tablet by mouth 3 (three) times daily. PT states takes at 0630am, 1230pm and 630pm at nite.  PT wants to stay on schedule within 30 minutes of these times at all possible.)   Cholecalciferol (VITAMIN D) 50 MCG (2000 UT) tablet, Take 2,000 Units by mouth 2 (two) times daily.   Magnesium 250 MG TABS, Take 1 tablet (250 mg total) by mouth 2 (two) times daily with a meal. (Patient taking differently: Take 1 tablet by mouth daily.)   Menthol, Topical Analgesic, (BIOFREEZE EX), Apply 1 Application topically daily as needed (pain).    polyethylene glycol powder (GLYCOLAX/MIRALAX) 17 GM/SCOOP powder, Take 17 g by mouth every other day. Alternating days with senna   pramipexole (MIRAPEX) 1 MG tablet, Take 1 tablet (1 mg total) by mouth 3 (three) times daily.   senna (SENOKOT) 8.6 MG tablet, Take 2 tablets by mouth every other day. Alternating days with miralax   COVID-19 mRNA vaccine 2023-2024 (COMIRNATY) SUSP injection, Inject into the muscle.   fosfomycin (MONUROL) 3 g PACK, 1 packet PO Begin today and repeat on Thursday (Patient not taking: Reported on 03/28/2022)   influenza vaccine adjuvanted (FLUAD) 0.5 ML injection, Inject into the muscle.   Facility-Administered Medications Ordered in Other Visits (Other):    polyethylene glycol powder (GLYCOLAX/MIRALAX) container 255 g No current facility-administered medications for this visit.  Health Maintenance:  Immunization History  Administered Date(s) Administered   COVID-19, mRNA, vaccine(Comirnaty)12 years and older 02/15/2022   Influenza Inj Mdck Quad With Preservative 02/22/2017, 01/21/2018, 02/06/2019   Influenza, High Dose Seasonal PF 02/18/2020, 02/10/2021, 02/07/2022   Influenza-Unspecified 02/03/2013, 02/05/2014   PFIZER Comirnaty(Gray Top)Covid-19 Tri-Sucrose Vaccine 08/17/2020, 02/17/2022   PFIZER(Purple Top)SARS-COV-2 Vaccination 06/14/2019, 07/09/2019, 03/06/2020   Pfizer Covid-19 Vaccine Bivalent Booster 33yr & up 01/27/2021   Pneumococcal Conjugate-13 02/18/2020   Pneumococcal Polysaccharide-23 03/24/2021   Pneumococcal-Unspecified 11/05/2012   Tdap 03/26/2015, 09/07/2021   Health Maintenance  Topic Date Due   Medicare Annual Wellness (AWV)  Never done   Zoster Vaccines- Shingrix (1 of 2) Never done   OPHTHALMOLOGY EXAM  09/17/2020   Diabetic kidney evaluation - Urine ACR  03/24/2022   FOOT EXAM  03/24/2022   COVID-19 Vaccine (7 - 2023-24 season) 04/14/2022   HEMOGLOBIN A1C  06/09/2022   COLONOSCOPY (Pts 45-436yrInsurance coverage will need to be  confirmed)  07/01/2022   Diabetic kidney evaluation - GFR measurement  12/19/2022   Pneumonia Vaccine 6572Years old  Completed   INFLUENZA VACCINE  Completed   Hepatitis C Screening  Completed   HPV VACCINES  Aged Out   Shingrix: insurance covers, can get at pharmacy  Covid 19: 2/2, 2021, pfizer + 3 boosters  Colonoscopy: 06/2012, diverticuloses, Dr. YiSonny Masters. DoLorenso Courier10 year recall Sleep study 2017   Last eye exam: 08/2021, Dr. WoSherral Hammerswill request report, monitoring cataract, has scheduled 10/06/2021 Last dental exam: Dr. ZiGeroge Basemanhad 07/2021, new implants Last derm exam: remote Dr. JoRonnald RampPatient Care Team: McUnk PintoMD as PCP - General (Internal Medicine) WoKeene Breath MD (Ophthalmology) MaAlexis FrockMD as Consulting Physician (Urology) BrLafayette DragonMD (Inactive) as Consulting Physician (Gastroenterology) AdNewton PiggRPVanguard Asc LLC Dba Vanguard Surgical CenterInactive) as Pharmacist (Pharmacist)  Medical History:  Past Medical History:  Diagnosis Date   Adult BMI 50.0-59.9 kg/sq m    52.77   Anal fissure    Cancer ([place])    kidney cancer   Diabetes mellitus without complication ([place])    borderline   Elevated PSA 08/13/2019   H/O: gout    STABLE   Hepatic steatosis 10/01/2011   History of kidney stones    Hyperlipidemia    Hypertension    Kidney tumor    right upper kidney tumor, monitoring   OSA on CPAP    [employer]   Parkinson disease 01/04/2016   REM sleep behavior disorder 12/06/2016   Right ureteral stone    Ureteral calculus, right 06/12/2011   Vitamin D deficiency    Weakness    Allergies Allergies  Allergen Reactions   Ciprofloxacin Hives   Ceftriaxone Hives    SURGICAL HISTORY He  has a past surgical history that includes Anal fissure repair (10-12-2000); Nasal septum surgery (1985); LEFT MEDIAL FEMORAL CONDYLE DEBRIDEMENT & DRILLING/ REMOVAL LOOSE BODY (09-15-2003); Stone extraction with basket (06/12/2011); kidney stone removal; Colonoscopy (N/A, 07/01/2012);  Cystoscopy with retrograde pyelogram, ureteroscopy and stent placement (Bilateral, 02/26/2013); Holmium laser application (Bilateral, 02/26/2013); and Robot assisted laparoscopic nephrectomy (Right, 12/16/2021). FAMILY HISTORY His family history includes Cancer in his father; Heart disease in his brother, maternal aunt, mother, and paternal grandmother; Hyperlipidemia in his brother, maternal aunt, mother, and paternal grandmother; Hypertension in his maternal aunt and mother; Lung cancer in his father; Melanoma in his brother; Stroke in his maternal aunt. SOCIAL HISTORY He  reports that he has quit smoking. He has never used smokeless tobacco. He reports that he does not drink alcohol and does not use drugs.   Review of Systems:  Review of Systems  Constitutional:  Negative for malaise/fatigue and weight loss.  HENT:  Negative for hearing loss and tinnitus.   Eyes:  Negative for blurred vision and double vision.  Respiratory:  Negative for cough, shortness of breath and wheezing.   Cardiovascular:  Negative for chest pain, palpitations, orthopnea, claudication and leg swelling.  Gastrointestinal:  Positive for constipation. Negative for abdominal pain, blood in stool, diarrhea, heartburn, melena, nausea and vomiting.  Genitourinary: Negative.   Musculoskeletal:  Negative for joint pain and myalgias.  Skin:  Negative for rash.  Neurological:  Positive for tremors (L hand). Negative for dizziness, tingling, sensory change, weakness and headaches.  Endo/Heme/Allergies:  Negative for polydipsia.  Psychiatric/Behavioral:  Negative for depression and memory loss. The patient has insomnia (occasionally ). The patient is not nervous/anxious ([place]).   All other systems reviewed and are negative.   Physical Exam: Estimated body mass index is 40.64 kg/m as calculated from the following:   Height as of this encounter: _0  (1.651 m).   Weight as of this encounter: 244 lb 3.2 oz (110.8 kg). BP  104/60   Pulse 68   Temp 97.7 F (36.5 C)   Ht _1  (1.651 m)   Wt 244 lb 3.2 oz (110.8 kg)   SpO2 97%   BMI 40.64 kg/m   General Appearance: Well nourished, well dressed morbidly obese male in no apparent distress.  Eyes: PERRLA, EOMs, conjunctiva no swelling or erythema ENT/Mouth: Ear canals clear bilaterally with no erythema, swelling, discharge.  TMs normal bilaterally with no erythema, bulging, or retractions.  Oropharynx clear and moist with no exudate, swelling, or erythema.  Dentition normal.   Neck: Supple, thyroid normal. No bruits, JVD, cervical adenopathy Respiratory:  Respiratory effort normal, BS equal bilaterally without rales, rhonchi, wheezing or stridor.  Cardio: RRR without murmurs, rubs or gallops. Brisk peripheral pulses without edema.  Chest: symmetric, with normal excursions Abdomen: Soft, morbidly obese limiting exam, nontender, no guarding, rebound, hernias, masses, or organomegaly. Musculoskeletal: Full ROM all peripheral extremities,5/5 strength, and normal gait.  Skin: Warm, dry without rashes, lesions, ecchymosis. Neuro: A&Ox3, Cranial nerves intact, slow gait.  Overshoot of the right hand with finger to nose.  Tremor of the left hand with finger opposition>tremor of the right hand with finger opposition.  Sensation intact.  Psych: Insight and Judgment appropriate.   Darrol Jump, DNP, AGNP-C 4:00 PM [facility]

## 2022-03-28 NOTE — Patient Instructions (Signed)

## 2022-03-29 LAB — HEMOGLOBIN A1C
Hgb A1c MFr Bld: 6.8 % of total Hgb — ABNORMAL HIGH (ref <5.7)
Mean Plasma Glucose: 148 mg/dL
eAG (mmol/L): 8.2 mmol/L

## 2022-03-29 LAB — VITAMIN B12: Vitamin B-12: 396 pg/mL (ref 200–1100)

## 2022-03-29 LAB — CBC WITH DIFFERENTIAL/PLATELET
Absolute Monocytes: 713 cells/uL (ref 200–950)
Basophils Absolute: 54 cells/uL (ref 0–200)
Basophils Relative: 0.5 %
Eosinophils Absolute: 130 cells/uL (ref 15–500)
Eosinophils Relative: 1.2 %
HCT: 41.8 % (ref 38.5–50.0)
Hemoglobin: 14.2 g/dL (ref 13.2–17.1)
Lymphs Abs: 2484 cells/uL (ref 850–3900)
MCH: 28.7 pg (ref 27.0–33.0)
MCHC: 34 g/dL (ref 32.0–36.0)
MCV: 84.4 fL (ref 80.0–100.0)
MPV: 11 fL (ref 7.5–12.5)
Monocytes Relative: 6.6 %
Neutro Abs: 7420 cells/uL (ref 1500–7800)
Neutrophils Relative %: 68.7 %
Platelets: 216 10*3/uL (ref 140–400)
RBC: 4.95 10*6/uL (ref 4.20–5.80)
RDW: 13.7 % (ref 11.0–15.0)
Total Lymphocyte: 23 %
WBC: 10.8 10*3/uL (ref 3.8–10.8)

## 2022-03-29 LAB — COMPLETE METABOLIC PANEL WITH GFR
AG Ratio: 1.3 (calc) (ref 1.0–2.5)
ALT: 7 U/L — ABNORMAL LOW (ref 9–46)
AST: 11 U/L (ref 10–35)
Albumin: 4.3 g/dL (ref 3.6–5.1)
Alkaline phosphatase (APISO): 54 U/L (ref 35–144)
BUN/Creatinine Ratio: 17 (calc) (ref 6–22)
BUN: 29 mg/dL — ABNORMAL HIGH (ref 7–25)
CO2: 26 mmol/L (ref 20–32)
Calcium: 9.9 mg/dL (ref 8.6–10.3)
Chloride: 98 mmol/L (ref 98–110)
Creat: 1.75 mg/dL — ABNORMAL HIGH (ref 0.70–1.35)
Globulin: 3.3 g/dL (calc) (ref 1.9–3.7)
Glucose, Bld: 82 mg/dL (ref 65–99)
Potassium: 3.9 mmol/L (ref 3.5–5.3)
Sodium: 137 mmol/L (ref 135–146)
Total Bilirubin: 0.4 mg/dL (ref 0.2–1.2)
Total Protein: 7.6 g/dL (ref 6.1–8.1)
eGFR: 42 mL/min/{1.73_m2} — ABNORMAL LOW (ref >=60)

## 2022-03-29 LAB — LIPID PANEL
Cholesterol: 94 mg/dL (ref <200)
HDL: 34 mg/dL — ABNORMAL LOW (ref >=40)
LDL Cholesterol (Calc): 39 mg/dL (calc)
Non-HDL Cholesterol (Calc): 60 mg/dL (calc) (ref <130)
Total CHOL/HDL Ratio: 2.8 (calc) (ref <5.0)
Triglycerides: 130 mg/dL (ref <150)

## 2022-03-29 LAB — PSA: PSA: 2.85 ng/mL (ref <=4.00)

## 2022-03-29 LAB — VITAMIN D 25 HYDROXY (VIT D DEFICIENCY, FRACTURES): Vit D, 25-Hydroxy: 72 ng/mL (ref 30–100)

## 2022-03-29 NOTE — Telephone Encounter (Signed)
LM-03/29/22-Chart review started. Reviewing OV, Consults, Hospital visits, Labs and medication changes. Chart review complete. Will call patient at another time.  Total time spent: 17 min.

## 2022-04-06 DIAGNOSIS — I10 Essential (primary) hypertension: Secondary | ICD-10-CM | POA: Diagnosis not present

## 2022-04-06 DIAGNOSIS — E1169 Type 2 diabetes mellitus with other specified complication: Secondary | ICD-10-CM | POA: Diagnosis not present

## 2022-04-06 DIAGNOSIS — E785 Hyperlipidemia, unspecified: Secondary | ICD-10-CM | POA: Diagnosis not present

## 2022-04-06 NOTE — Telephone Encounter (Signed)
LM-04/06/22-Called pt. And scheduled FOC for 07/11/22. Pt. Aware CP will call between 3-5PM and I will call a few days prior to visit to confirm.  Total time spent:6 min.

## 2022-04-12 DIAGNOSIS — G4733 Obstructive sleep apnea (adult) (pediatric): Secondary | ICD-10-CM | POA: Diagnosis not present

## 2022-04-13 ENCOUNTER — Other Ambulatory Visit (HOSPITAL_BASED_OUTPATIENT_CLINIC_OR_DEPARTMENT_OTHER): Payer: Self-pay

## 2022-04-13 MED ORDER — AREXVY 120 MCG/0.5ML IM SUSR
INTRAMUSCULAR | 0 refills | Status: DC
  Filled 2022-04-13: qty 0.5, 1d supply, fill #0

## 2022-04-20 ENCOUNTER — Ambulatory Visit: Payer: Medicare Other | Attending: Adult Health | Admitting: Physical Therapy

## 2022-04-20 ENCOUNTER — Ambulatory Visit: Payer: Medicare Other

## 2022-04-20 DIAGNOSIS — R471 Dysarthria and anarthria: Secondary | ICD-10-CM

## 2022-04-20 DIAGNOSIS — R2681 Unsteadiness on feet: Secondary | ICD-10-CM | POA: Insufficient documentation

## 2022-04-20 DIAGNOSIS — R2689 Other abnormalities of gait and mobility: Secondary | ICD-10-CM | POA: Insufficient documentation

## 2022-04-20 DIAGNOSIS — R29818 Other symptoms and signs involving the nervous system: Secondary | ICD-10-CM | POA: Insufficient documentation

## 2022-04-20 NOTE — Therapy (Signed)
[facility] [place] [address] [place], [place], [address] Phone: [phone]   Fax:  [phone]  Patient Details  Name: Todd Rice MRN: [id number] Date of Birth: Aug 22, 1955 Referring Provider:  Ward Givens, NP  Encounter Date: 04/20/2022  Physical Therapy Parkinson's Disease Screen   Timed Up and Go test:12.38 sec (compared to 9.25 sec)  10 meter walk test:11.88 sec (2.76 ft/sec)-compared to 3.55 ft/sec  5 time sit to stand test:12.72 sec (compared to 11.85 sec)  Pt had one fall in April 2023.    Patient would benefit from Physical Therapy evaluation due to slowed mobility measures compared to discharge.     Laporcha Marchesi W., PT 04/20/2022, 12:15 PM  [facility] [address] [place], [place], [address] Phone: [phone]   Fax:  [phone]

## 2022-04-20 NOTE — Therapy (Signed)
[facility] [address] [place], [place], [address] Phone: [phone]   Fax:  [phone]  Patient Details  Name: Todd Rice MRN: [id number] Date of Birth: 02-09-1956 Referring Provider:  Ward Givens, NP  Encounter Date: 04/20/2022  Speech Therapy Parkinson's Disease Screen   Decibel Level today:low 70s dB  (WNL=70-72 dB) with sound level meter 30cm away from pt's masked mouth. Pt's conversational volume has remained essentially the same since last treatment course  Pt does not not report difficulty with swallowing, which does not warrant further evaluation.  Pt does does not require speech therapy services at this time. Recommend ST screen in another 6-8 months.  [place], [place] 04/20/2022, 12:12 PM  [facility] [place] [address], [place], [place], [address] Phone: [phone]   Fax:  [phone]

## 2022-04-24 ENCOUNTER — Telehealth: Payer: Self-pay | Admitting: Physical Therapy

## 2022-04-24 DIAGNOSIS — G20A1 Parkinson's disease without dyskinesia, without mention of fluctuations: Secondary | ICD-10-CM

## 2022-04-24 NOTE — Telephone Encounter (Signed)
Todd Rice was seen for PT and speech therapy screens in our multi-disciplinary clinic.  Physical therapy is recommended for follow up due to slowed mobility measures since previous discharge.  If you agree, please send referral via Epic for PT evaluate and treat.  Thank you.   Mady Haagensen, PT 04/24/22 1:29 PM Phone: [phone] Fax: [phone]  [facility] [place], [place], [place] [address] Phone # [phone] Fax # [phone]

## 2022-04-25 NOTE — Telephone Encounter (Signed)
done

## 2022-04-26 ENCOUNTER — Encounter: Payer: Self-pay | Admitting: Physical Therapy

## 2022-04-26 ENCOUNTER — Ambulatory Visit: Payer: Medicare Other | Admitting: Physical Therapy

## 2022-04-26 ENCOUNTER — Other Ambulatory Visit: Payer: Self-pay

## 2022-04-26 DIAGNOSIS — R29818 Other symptoms and signs involving the nervous system: Secondary | ICD-10-CM

## 2022-04-26 DIAGNOSIS — R471 Dysarthria and anarthria: Secondary | ICD-10-CM | POA: Diagnosis not present

## 2022-04-26 DIAGNOSIS — R2689 Other abnormalities of gait and mobility: Secondary | ICD-10-CM | POA: Diagnosis not present

## 2022-04-26 DIAGNOSIS — R2681 Unsteadiness on feet: Secondary | ICD-10-CM | POA: Diagnosis not present

## 2022-04-26 NOTE — Therapy (Signed)
OUTPATIENT PHYSICAL THERAPY NEURO EVALUATION   Patient Name: Todd Rice MRN: [id number] DOB:[birth year], 66 y.o., male Today's Date: 04/26/2022   PCP: Unk Pinto, MD REFERRING PROVIDER: Ward Givens, NP   END OF SESSION:  PT End of Session - 04/26/22 0803     Visit Number 1    Number of Visits 8    Date for PT Re-Evaluation 05/19/22    Authorization Type [employer] Medicare    PT Start Time 0800    PT Stop Time 0844    PT Time Calculation (min) 44 min    Activity Tolerance Patient tolerated treatment well    Behavior During Therapy [facility] for tasks assessed/performed             Past Medical History:  Diagnosis Date   Adult BMI 50.0-59.9 kg/sq m    52.77   Anal fissure    Cancer ([place])    kidney cancer   Diabetes mellitus without complication ([place])    borderline   Elevated PSA 08/13/2019   H/O: gout    STABLE   Hepatic steatosis 10/01/2011   History of kidney stones    Hyperlipidemia    Hypertension    Kidney tumor    right upper kidney tumor, monitoring   OSA on CPAP    [employer]   Parkinson disease 01/04/2016   REM sleep behavior disorder 12/06/2016   Right ureteral stone    Ureteral calculus, right 06/12/2011   Vitamin D deficiency    Weakness    Past Surgical History:  Procedure Laterality Date   ANAL FISSURE REPAIR  10-12-2000   COLONOSCOPY N/A 07/01/2012   Procedure: COLONOSCOPY;  Surgeon: Lafayette Dragon, MD;  Location: [facility];  Service: Endoscopy;  Laterality: N/A;   CYSTOSCOPY WITH RETROGRADE PYELOGRAM, URETEROSCOPY AND STENT PLACEMENT Bilateral 02/26/2013   Procedure: CYSTOSCOPY WITH RETROGRADE PYELOGRAM, URETEROSCOPY AND STENT PLACEMENT;  Surgeon: Alexis Frock, MD;  Location: [facility];  Service: Urology;  Laterality: Bilateral;   HOLMIUM LASER APPLICATION Bilateral 50/53/9767   Procedure: HOLMIUM LASER APPLICATION;  Surgeon: Alexis Frock, MD;  Location: [facility];  Service: Urology;  Laterality: Bilateral;   kidney stone removal      06/2011-stent placement   LEFT MEDIAL FEMORAL CONDYLE DEBRIDEMENT & DRILLING/ REMOVAL LOOSE BODY  09-15-2003   NASAL SEPTUM SURGERY  1985   ROBOT ASSISTED LAPAROSCOPIC NEPHRECTOMY Right 12/16/2021   Procedure: XI ROBOTIC ASSISTED LAPAROSCOPIC RADICAL NEPHRECTOMY;  Surgeon: Alexis Frock, MD;  Location: [facility];  Service: Urology;  Laterality: Right;  3 HRS   STONE EXTRACTION WITH BASKET  06/12/2011   Procedure: STONE EXTRACTION WITH BASKET;  Surgeon: Claybon Jabs, MD;  Location: [facility];  Service: Urology;  Laterality: Right;   Patient Active Problem List   Diagnosis Date Noted   Renal mass 12/16/2021   Constipation 03/24/2021   Gout 03/23/2021   Primary renal papillary carcinoma, right ([place]) 02/18/2020   Hepatic steatosis 02/18/2020   Major depression in remission ([place]) 11/11/2019   Central sleep apnea due to Cheyne-Stokes respiration 08/31/2019   Hyperlipidemia associated with type 2 diabetes mellitus ([place]) 07/09/2019   Aortic atherosclerosis ([place]) by CXR on 06/10/2018 06/10/2018   Type 2 diabetes mellitus with stage 3a chronic kidney disease, without long-term current use of insulin ([place]) 01/21/2018   OSA treated with BiPAP 01/03/2017   REM sleep behavior disorder 12/06/2016   CKD stage 3 due to type 2 diabetes mellitus ([place]) 10/23/2016   Medication management 10/23/2016   Vitamin D  deficiency 10/23/2016   Parkinson's disease 01/04/2016   Seasonal and perennial allergic rhinitis 06/21/2014   Asthma, mild intermittent, well-controlled 06/21/2014   Pseudomonas aeruginosa colonization 10/08/2012   CAD (coronary artery disease) 11/15/2011   Essential hypertension 11/15/2011   Morbid obesity with BMI of 40.0-44.9, adult ([place]) 11/15/2011    ONSET DATE: 04/20/2022 (PD screen)  REFERRING DIAG: G20.A1 (ICD-10-CM) - Parkinson's disease without dyskinesia or fluctuating manifestations   THERAPY DIAG:  Other abnormalities of gait and mobility  Unsteadiness on  feet  Other symptoms and signs involving the nervous system  Rationale for Evaluation and Treatment: Rehabilitation  SUBJECTIVE:                                                                                                                                                                                             SUBJECTIVE STATEMENT: Think I'm slowing down some.  Having to put more thoughts into things that I used to.  When I first get up, I'm more unsteady-backing up, closing eyes, etc.  Had the kidney surgery in August.   Pt accompanied by: self  PERTINENT HISTORY: See above  PAIN:  Are you having pain? No  PRECAUTIONS: Fall  WEIGHT BEARING RESTRICTIONS: No  FALLS: Has patient fallen in last 6 months? No  LIVING ENVIRONMENT: Lives with: lives with their spouse Lives in: House/apartment Stairs: Yes: Internal: 1 steps; none and External: 12 steps; doesn't have to go upstairs Has following equipment at home: None  PLOF: Independent, mows the grass and walks the dog, does exercises from OT.  PATIENT GOALS: Pt's goals for therapy are to improve movement and motivation for exercise following the surgery.  OBJECTIVE:   DIAGNOSTIC FINDINGS: NA  COGNITION: Overall cognitive status: Within functional limits for tasks assessed  Pt reports having to think about things more.   SENSATION: Light touch: WFL   POSTURE: rounded shoulders and forward head  LOWER EXTREMITY ROM:   WFLs  Active  Right Eval Left Eval  Hip flexion    Hip extension    Hip abduction    Hip adduction    Hip internal rotation    Hip external rotation    Knee flexion    Knee extension    Ankle dorsiflexion    Ankle plantarflexion    Ankle inversion    Ankle eversion     (Blank rows = not tested)  LOWER EXTREMITY MMT:  Grossly tested at least 4+/5 BLEs  MMT Right Eval Left Eval  Hip flexion    Hip extension    Hip abduction    Hip adduction    Hip internal rotation  Hip external  rotation    Knee flexion    Knee extension    Ankle dorsiflexion    Ankle plantarflexion    Ankle inversion    Ankle eversion    (Blank rows = not tested)   TRANSFERS: Assistive device utilized: None  Sit to stand: Modified independence Stand to sit: Modified independence  GAIT: Gait pattern: step through pattern, decreased arm swing- Right, and decreased arm swing- Left Distance walked: 50 ft x 4 Assistive device utilized: None Level of assistance: Modified independence  FUNCTIONAL TESTS:  5 times sit to stand: 10.16 sec Timed up and go (TUG): 11.78 sec 10 meter walk test: 10.56 sec (3.11 ft/sec) TUG cognitive:  13 sec TUG manual:12.03 sec MiniBEST: 25/28 FGA:  26/30   [facility] PT Assessment - 04/26/22 0001       Standardized Balance Assessment   Standardized Balance Assessment Mini-BESTest      Mini-BESTest   Sit To Stand Normal: Comes to stand without use of hands and stabilizes independently.    Rise to Toes Normal: Stable for 3 s with maximum height.    Stand on one leg (left) Normal: 20 s.   15.47, 20   Stand on one leg (right) Normal: 20 s.    Stand on one leg - lowest score 2    Compensatory Stepping Correction - Forward Normal: Recovers independently with a single, large step (second realignement is allowed).    Compensatory Stepping Correction - Backward Moderate: More than one step is required to recover equilibrium    Compensatory Stepping Correction - Left Lateral Normal: Recovers independently with 1 step (crossover or lateral OK)    Compensatory Stepping Correction - Right Lateral Normal: Recovers independently with 1 step (crossover or lateral OK)    Stepping Corredtion Lateral - lowest score 2    Stance - Feet together, eyes open, firm surface  Normal: 30s    Stance - Feet together, eyes closed, foam surface  Normal: 30s   increased sway   Incline - Eyes Closed Normal: Stands independently 30s and aligns with gravity    Change in Gait Speed Normal:  Significantly changes walkling speed without imbalance    Walk with head turns - Horizontal Moderate: performs head turns with reduction in gait speed.   5.97/7.13   Walk with pivot turns Normal: Turns with feet close FAST (< 3 steps) with good balance.    Step over obstacles Normal: Able to step over box with minimal change of gait speed and with good balance.    Timed UP & GO with Dual Task Moderate: Dual Task affects either counting OR walking (>10%) when compared to the TUG without Dual Task.    Mini-BEST total score 25      Functional Gait  Assessment   Gait assessed  Yes    Gait Level Surface Walks 20 ft in less than 7 sec but greater than 5.5 sec, uses assistive device, slower speed, mild gait deviations, or deviates 6-10 in outside of the 12 in walkway width.   5.97 sec   Change in Gait Speed Able to smoothly change walking speed without loss of balance or gait deviation. Deviate no more than 6 in outside of the 12 in walkway width.    Gait with Horizontal Head Turns Performs head turns smoothly with slight change in gait velocity (eg, minor disruption to smooth gait path), deviates 6-10 in outside 12 in walkway width, or uses an assistive device.   7.13   Gait  with Vertical Head Turns Performs task with slight change in gait velocity (eg, minor disruption to smooth gait path), deviates 6 - 10 in outside 12 in walkway width or uses assistive device   6.97   Gait and Pivot Turn Pivot turns safely within 3 sec and stops quickly with no loss of balance.    Step Over Obstacle Is able to step over 2 stacked shoe boxes taped together (9 in total height) without changing gait speed. No evidence of imbalance.    Gait with Narrow Base of Support Is able to ambulate for 10 steps heel to toe with no staggering.    Gait with Eyes Closed Walks 20 ft, uses assistive device, slower speed, mild gait deviations, deviates 6-10 in outside 12 in walkway width. Ambulates 20 ft in less than 9 sec but greater than  7 sec.   8.62, veers to L   Ambulating Backwards Walks 20 ft, no assistive devices, good speed, no evidence for imbalance, normal gait    Steps Alternating feet, no rail.    Total Score 26               M-CTSIB  Condition 1: Firm Surface, EO 30 Sec, Normal Sway  Condition 2: Firm Surface, EC 30 Sec, Mild Sway  Condition 3: Foam Surface, EO 30 Sec, Mild Sway  Condition 4: Foam Surface, EC 30 Sec, Mild Sway     TODAY'S TREATMENT:                                                                                                                              DATE: 04/26/2022    PATIENT EDUCATION: Education details: PT eval results, POC; initiated HEP Person educated: Patient Education method: Explanation, Demonstration, and Handouts Education comprehension: verbalized understanding and returned demonstration  HOME EXERCISE PROGRAM: Access Code: [email or web address] URL: [email or web address] Date: 04/26/2022 Prepared by: [facility]  Exercises - Romberg Stance Eyes Closed on Foam Pad  - 1 x daily - 5 x weekly - 1 sets - 3 reps - 30 sec hold - Romberg Stance with Head Nods on Foam Pad  - 1 x daily - 5 x weekly - 1 sets - 10 reps - Romberg Stance on Foam Pad with Head Rotation  - 1 x daily - 5 x weekly - 1 sets - 10 reps  GOALS: Goals reviewed with patient? Yes  SHORT TERM GOALS: = LTGs   LONG TERM GOALS: Target date: 05/19/2022  Pt will be independent with HEP for improved balance, gait. Baseline:  Goal status: INITIAL  2.  Pt will improve TUG/TUG cognitive score to less than or equal to 10% difference for decreased fall risk, improved dual tasking. Baseline: 11.78 sec, 13 sec TUG cog  Goal status: INITIAL  3.  Pt will perform posterior push and release in 2 steps or less for posterior balance recovery. Baseline: 3-4 small steps to regain balance Goal  status: INITIAL  4.  Pt will perform eyes closed compliant surface, feet  together, mild sway x 30 seconds for improved balance in shower. Baseline: 30 seconds, but mild/moderate sway Goal status: INITIAL   ASSESSMENT:  CLINICAL IMPRESSION: Patient is a 66 y.o. male who was seen today for physical therapy evaluation and treatment for Parkinson's disease.   He was seen for return PD screens on 04/20/22, and PT requested orders for return eval, due to slowed mobility measures since last bout of therapy.  In the past 6 months, pt has had kidney surgery and does not feel his mobility is back to level prior to surgery.  He notes balance difficulty with household activities with vision removed (ex-showering) and slower thinking skills associated with movement patterns (noted today with slightly >10% difference with TUG/TUG cognitive scores).  Compared to discharge, pt has slowed slightly on TUG and gait velocity measures.  He does have increased sway and unsteadiness with vision removed and narrowed BOS on compliant surfaces.  He also has difficulty with balance recovery in posterior direction with >3-4 small steps on posterior push and release test today.  He would benefit from skilled PT to address the above stated deficits to improve overall functional mobility and decreased fall risk.  OBJECTIVE IMPAIRMENTS: Abnormal gait, decreased balance, and decreased mobility.   ACTIVITY LIMITATIONS: bed mobility, bathing, hygiene/grooming, and locomotion level  PARTICIPATION LIMITATIONS: community activity  PERSONAL FACTORS: 3+ comorbidities: See PMH above  are also affecting patient's functional outcome.   REHAB POTENTIAL: Good  CLINICAL DECISION MAKING: Stable/uncomplicated  EVALUATION COMPLEXITY: Low  PLAN:  PT FREQUENCY: 2x/week  PT DURATION: 4 weeks including eval  PLANNED INTERVENTIONS: Therapeutic exercises, Therapeutic activity, Neuromuscular re-education, Balance training, Gait training, Patient/Family education, and Self Care  PLAN FOR NEXT SESSION: Review  initial HEP; continue compliant surfaces, vision removed; step strategy work for balance recovery.  Assess 6 MWT .   Frazier Butt., PT 04/26/2022, 12:44 PM  [facility] [address] [place], [place], [place] [address] Phone # [phone] Fax # [phone]

## 2022-04-28 ENCOUNTER — Ambulatory Visit: Payer: Medicare Other | Admitting: Physical Therapy

## 2022-04-28 ENCOUNTER — Encounter: Payer: Self-pay | Admitting: Physical Therapy

## 2022-04-28 DIAGNOSIS — R2689 Other abnormalities of gait and mobility: Secondary | ICD-10-CM | POA: Diagnosis not present

## 2022-04-28 DIAGNOSIS — R2681 Unsteadiness on feet: Secondary | ICD-10-CM | POA: Diagnosis not present

## 2022-04-28 DIAGNOSIS — R29818 Other symptoms and signs involving the nervous system: Secondary | ICD-10-CM | POA: Diagnosis not present

## 2022-04-28 DIAGNOSIS — R471 Dysarthria and anarthria: Secondary | ICD-10-CM | POA: Diagnosis not present

## 2022-04-28 NOTE — Therapy (Signed)
OUTPATIENT PHYSICAL THERAPY NEURO TREATMENT NOTE   Patient Name: Todd Rice MRN: [id number] DOB:07-09-1955, 66 y.o., male Today's Date: 04/28/2022   PCP: Unk Pinto, MD REFERRING PROVIDER: Ward Givens, NP   END OF SESSION:  PT End of Session - 04/28/22 0753     Visit Number 2    Number of Visits 8    Date for PT Re-Evaluation 05/19/22    Authorization Type UHC Medicare    PT Start Time 0758    PT Stop Time 0842    PT Time Calculation (min) 44 min    Equipment Utilized During Treatment Gait belt    Activity Tolerance Patient tolerated treatment well    Behavior During Therapy WFL for tasks assessed/performed             Past Medical History:  Diagnosis Date   Adult BMI 50.0-59.9 kg/sq m    52.77   Anal fissure    Cancer ([place])    kidney cancer   Diabetes mellitus without complication ([place])    borderline   Elevated PSA 08/13/2019   H/O: gout    STABLE   Hepatic steatosis 10/01/2011   History of kidney stones    Hyperlipidemia    Hypertension    Kidney tumor    right upper kidney tumor, monitoring   OSA on CPAP    [employer]   Parkinson disease 01/04/2016   REM sleep behavior disorder 12/06/2016   Right ureteral stone    Ureteral calculus, right 06/12/2011   Vitamin D deficiency    Weakness    Past Surgical History:  Procedure Laterality Date   ANAL FISSURE REPAIR  10-12-2000   COLONOSCOPY N/A 07/01/2012   Procedure: COLONOSCOPY;  Surgeon: Lafayette Dragon, MD;  [facility];  Service: Endoscopy;  Laterality: N/A;   CYSTOSCOPY WITH RETROGRADE PYELOGRAM, URETEROSCOPY AND STENT PLACEMENT Bilateral 02/26/2013   Procedure: CYSTOSCOPY WITH RETROGRADE PYELOGRAM, URETEROSCOPY AND STENT PLACEMENT;  Surgeon: Alexis Frock, MD;  [facility];  Service: Urology;  Laterality: Bilateral;   HOLMIUM LASER APPLICATION Bilateral 00/93/8182   Procedure: HOLMIUM LASER APPLICATION;  Surgeon: Alexis Frock, MD;  [facility];  Service:  Urology;  Laterality: Bilateral;   kidney stone removal     06/2011-stent placement   LEFT MEDIAL FEMORAL CONDYLE DEBRIDEMENT & DRILLING/ REMOVAL LOOSE BODY  09-15-2003   NASAL SEPTUM SURGERY  1985   ROBOT ASSISTED LAPAROSCOPIC NEPHRECTOMY Right 12/16/2021   Procedure: XI ROBOTIC ASSISTED LAPAROSCOPIC RADICAL NEPHRECTOMY;  Surgeon: Alexis Frock, MD;  [facility];  Service: Urology;  Laterality: Right;  3 HRS   STONE EXTRACTION WITH BASKET  06/12/2011   Procedure: STONE EXTRACTION WITH BASKET;  Surgeon: Claybon Jabs, MD;  Location: [facility];  Service: Urology;  Laterality: Right;   Patient Active Problem List   Diagnosis Date Noted   Renal mass 12/16/2021   Constipation 03/24/2021   Gout 03/23/2021   Primary renal papillary carcinoma, right ([place]) 02/18/2020   Hepatic steatosis 02/18/2020   Major depression in remission ([place]) 11/11/2019   Central sleep apnea due to Cheyne-Stokes respiration 08/31/2019   Hyperlipidemia associated with type 2 diabetes mellitus ([place]) 07/09/2019   Aortic atherosclerosis ([place]) by CXR on 06/10/2018 06/10/2018   Type 2 diabetes mellitus with stage 3a chronic kidney disease, without long-term current use of insulin ([place]) 01/21/2018   OSA treated with BiPAP 01/03/2017   REM sleep behavior disorder 12/06/2016   CKD stage 3 due to type 2 diabetes mellitus ([place])  10/23/2016   Medication management 10/23/2016   Vitamin D deficiency 10/23/2016   Parkinson's disease 01/04/2016   Seasonal and perennial allergic rhinitis 06/21/2014   Asthma, mild intermittent, well-controlled 06/21/2014   Pseudomonas aeruginosa colonization 10/08/2012   CAD (coronary artery disease) 11/15/2011   Essential hypertension 11/15/2011   Morbid obesity with BMI of 40.0-44.9, adult ([place]) 11/15/2011    ONSET DATE: 04/20/2022 (PD screen)  REFERRING DIAG: G20.A1 (ICD-10-CM) - Parkinson's disease without dyskinesia or fluctuating manifestations   THERAPY DIAG:  Other  abnormalities of gait and mobility  Unsteadiness on feet  Other symptoms and signs involving the nervous system  Rationale for Evaluation and Treatment: Rehabilitation  SUBJECTIVE:                                                                                                                                                                                             SUBJECTIVE STATEMENT: "It's going"  No changes.   Pt accompanied by: self  PERTINENT HISTORY: See above  PAIN:  Are you having pain? No  PRECAUTIONS: Fall  WEIGHT BEARING RESTRICTIONS: No  FALLS: Has patient fallen in last 6 months? No  LIVING ENVIRONMENT: Lives with: lives with their spouse Lives in: House/apartment Stairs: Yes: Internal: 1 steps; none and External: 12 steps; doesn't have to go upstairs Has following equipment at home: None  PLOF: Independent, mows the grass and walks the dog, does exercises from OT.  PATIENT GOALS: Pt's goals for therapy are to improve movement and motivation for exercise following the surgery.  OBJECTIVE:    TODAY'S TREATMENT: 04/28/2022 Activity Comments  6 MWT:  1133 ft no device, indoor surfaces See flowsheet for details  On Airex:  Reviewed Romberg stance with head turns/nods, then with EC -EC with head turns/nods -Partial tandem stance with EO head turns/nods, EC head steady, 15 sec    -increased unsteadiness with EC  Step strategy on Airex: -forward x 10 -side x 10 -back x 10 -forward/back x 10   Tandem gait progressing to tandem march forward/back Occasional unsteadiness  Monster walk forward/back at counter Occasional unsteadiness with posterior direction      PATIENT EDUCATION: Education details: Benefits of walking program-goal of walking 3x/wk for 15-20 minutes  Person educated: Patient Education method: Customer service manager Education comprehension: verbalized understanding and returned demonstration    ------------------------------------------------------------------------------------------------ Objective measures below taken at initial evaluation: DIAGNOSTIC FINDINGS: NA  COGNITION: Overall cognitive status: Within functional limits for tasks assessed  Pt reports having to think about things more.   SENSATION: Light touch: WFL   POSTURE: rounded shoulders and forward head  LOWER EXTREMITY ROM:  WFLs  Active  Right Eval Left Eval  Hip flexion    Hip extension    Hip abduction    Hip adduction    Hip internal rotation    Hip external rotation    Knee flexion    Knee extension    Ankle dorsiflexion    Ankle plantarflexion    Ankle inversion    Ankle eversion     (Blank rows = not tested)  LOWER EXTREMITY MMT:  Grossly tested at least 4+/5 BLEs  MMT Right Eval Left Eval  Hip flexion    Hip extension    Hip abduction    Hip adduction    Hip internal rotation    Hip external rotation    Knee flexion    Knee extension    Ankle dorsiflexion    Ankle plantarflexion    Ankle inversion    Ankle eversion    (Blank rows = not tested)   TRANSFERS: Assistive device utilized: None  Sit to stand: Modified independence Stand to sit: Modified independence  GAIT: Gait pattern: step through pattern, decreased arm swing- Right, and decreased arm swing- Left Distance walked: 50 ft x 4 Assistive device utilized: None Level of assistance: Modified independence  FUNCTIONAL TESTS:  5 times sit to stand: 10.16 sec Timed up and go (TUG): 11.78 sec 10 meter walk test: 10.56 sec (3.11 ft/sec) TUG cognitive:  13 sec TUG manual:12.03 sec MiniBEST: 25/28 FGA:  26/30   [facility] PT Assessment - 04/28/22 0001       6 Minute Walk- Baseline   6 Minute Walk- Baseline yes    BP (mmHg) 108/75    HR (bpm) 71    02 Sat (%RA) 96 %      6 Minute walk- Post Test   6 Minute Walk Post Test yes    BP (mmHg) 119/78    HR (bpm) 84    02 Sat (%RA) 97 %    Modified Borg Scale  for Dyspnea 0.5- Very, very slight shortness of breath    Perceived Rate of Exertion (Borg) 7- Very, very light      6 minute walk test results    Aerobic Endurance Distance Walked 1133                M-CTSIB  Condition 1: Firm Surface, EO 30 Sec, Normal Sway  Condition 2: Firm Surface, EC 30 Sec, Mild Sway  Condition 3: Foam Surface, EO 30 Sec, Mild Sway  Condition 4: Foam Surface, EC 30 Sec, Mild Sway     TODAY'S TREATMENT:                                                                                                                              DATE: 04/26/2022    PATIENT EDUCATION: Education details: PT eval results, POC; initiated HEP Person educated: Patient Education method: Explanation, Demonstration, and Handouts Education comprehension: verbalized understanding and returned demonstration  HOME EXERCISE  PROGRAM: Access Code: [email or web address] URL: [email or web address] Date: 04/26/2022 Prepared by: [facility]  Exercises - Romberg Stance Eyes Closed on Foam Pad  - 1 x daily - 5 x weekly - 1 sets - 3 reps - 30 sec hold - Romberg Stance with Head Nods on Foam Pad  - 1 x daily - 5 x weekly - 1 sets - 10 reps - Romberg Stance on Foam Pad with Head Rotation  - 1 x daily - 5 x weekly - 1 sets - 10 reps  GOALS: Goals reviewed with patient? Yes  SHORT TERM GOALS: = LTGs   LONG TERM GOALS: Target date: 05/19/2022  Pt will be independent with HEP for improved balance, gait. Baseline:  Goal status: IN PROGRESS  2.  Pt will improve TUG/TUG cognitive score to less than or equal to 10% difference for decreased fall risk, improved dual tasking. Baseline: 11.78 sec, 13 sec TUG cog  Goal status: IN PROGRESS  3.  Pt will perform posterior push and release in 2 steps or less for posterior balance recovery. Baseline: 3-4 small steps to regain balance Goal status: IN PROGRESS  4.  Pt will perform eyes closed compliant  surface, feet together, mild sway x 30 seconds for improved balance in shower. Baseline: 30 seconds, but mild/moderate sway Goal status: IN PROGRESS   ASSESSMENT:  CLINICAL IMPRESSION: Skilled PT session today focused on review of HEP, assessment of 6MWT with recommendations for walking program at home, and compliant balance exercise progression.  Pt has increased difficulty with balance on compliant surfaces with eyes closed and head motions, with RLE in posterior foot position and with dynamic balance in posterior direction.  He will continue to benefit from skilled PT to improve high level balance and functional mobility.  OBJECTIVE IMPAIRMENTS: Abnormal gait, decreased balance, and decreased mobility.   ACTIVITY LIMITATIONS: bed mobility, bathing, hygiene/grooming, and locomotion level  PARTICIPATION LIMITATIONS: community activity  PERSONAL FACTORS: 3+ comorbidities: See PMH above  are also affecting patient's functional outcome.   REHAB POTENTIAL: Good  CLINICAL DECISION MAKING: Stable/uncomplicated  EVALUATION COMPLEXITY: Low  PLAN:  PT FREQUENCY: 2x/week  PT DURATION: 4 weeks including eval  PLANNED INTERVENTIONS: Therapeutic exercises, Therapeutic activity, Neuromuscular re-education, Balance training, Gait training, Patient/Family education, and Self Care  PLAN FOR NEXT SESSION: Ask about walking program at home.  Continue compliant surfaces, vision removed; step strategy work for balance recovery, monster walk with resistance.     Frazier Butt., PT 04/28/2022, 8:48 AM  [facility] at [facility], [place], [place] [address] Phone # [phone] Fax # [phone]

## 2022-05-02 ENCOUNTER — Encounter: Payer: Self-pay | Admitting: Physical Therapy

## 2022-05-02 ENCOUNTER — Ambulatory Visit: Payer: Medicare Other | Admitting: Physical Therapy

## 2022-05-02 DIAGNOSIS — R29818 Other symptoms and signs involving the nervous system: Secondary | ICD-10-CM | POA: Diagnosis not present

## 2022-05-02 DIAGNOSIS — R2681 Unsteadiness on feet: Secondary | ICD-10-CM | POA: Diagnosis not present

## 2022-05-02 DIAGNOSIS — R2689 Other abnormalities of gait and mobility: Secondary | ICD-10-CM

## 2022-05-02 DIAGNOSIS — R471 Dysarthria and anarthria: Secondary | ICD-10-CM | POA: Diagnosis not present

## 2022-05-02 NOTE — Therapy (Signed)
OUTPATIENT PHYSICAL THERAPY NEURO TREATMENT NOTE   Patient Name: Todd Rice MRN: [id number] DOB:19-Aug-1955, 66 y.o., male Today's Date: 05/02/2022   PCP: Unk Pinto, MD REFERRING PROVIDER: Ward Givens, NP   END OF SESSION:  PT End of Session - 05/02/22 0752     Visit Number 3    Number of Visits 8    Date for PT Re-Evaluation 05/19/22    Authorization Type UHC Medicare    PT Start Time 0758    PT Stop Time 0845    PT Time Calculation (min) 47 min    Equipment Utilized During Treatment Gait belt    Activity Tolerance Patient tolerated treatment well    Behavior During Therapy WFL for tasks assessed/performed             Past Medical History:  Diagnosis Date   Adult BMI 50.0-59.9 kg/sq m    52.77   Anal fissure    Cancer ([place])    kidney cancer   Diabetes mellitus without complication ([place])    borderline   Elevated PSA 08/13/2019   H/O: gout    STABLE   Hepatic steatosis 10/01/2011   History of kidney stones    Hyperlipidemia    Hypertension    Kidney tumor    right upper kidney tumor, monitoring   OSA on CPAP    [employer]   Parkinson disease 01/04/2016   REM sleep behavior disorder 12/06/2016   Right ureteral stone    Ureteral calculus, right 06/12/2011   Vitamin D deficiency    Weakness    Past Surgical History:  Procedure Laterality Date   ANAL FISSURE REPAIR  10-12-2000   COLONOSCOPY N/A 07/01/2012   Procedure: COLONOSCOPY;  Surgeon: Lafayette Dragon, MD;  [facility];  Service: Endoscopy;  Laterality: N/A;   CYSTOSCOPY WITH RETROGRADE PYELOGRAM, URETEROSCOPY AND STENT PLACEMENT Bilateral 02/26/2013   Procedure: CYSTOSCOPY WITH RETROGRADE PYELOGRAM, URETEROSCOPY AND STENT PLACEMENT;  Surgeon: Alexis Frock, MD;  [facility];  Service: Urology;  Laterality: Bilateral;   HOLMIUM LASER APPLICATION Bilateral 13/24/4010   Procedure: HOLMIUM LASER APPLICATION;  Surgeon: Alexis Frock, MD;  [facility];  Service:  Urology;  Laterality: Bilateral;   kidney stone removal     06/2011-stent placement   LEFT MEDIAL FEMORAL CONDYLE DEBRIDEMENT & DRILLING/ REMOVAL LOOSE BODY  09-15-2003   NASAL SEPTUM SURGERY  1985   ROBOT ASSISTED LAPAROSCOPIC NEPHRECTOMY Right 12/16/2021   Procedure: XI ROBOTIC ASSISTED LAPAROSCOPIC RADICAL NEPHRECTOMY;  Surgeon: Alexis Frock, MD;  [facility];  Service: Urology;  Laterality: Right;  3 HRS   STONE EXTRACTION WITH BASKET  06/12/2011   Procedure: STONE EXTRACTION WITH BASKET;  Surgeon: Claybon Jabs, MD;  Location: [facility];  Service: Urology;  Laterality: Right;   Patient Active Problem List   Diagnosis Date Noted   Renal mass 12/16/2021   Constipation 03/24/2021   Gout 03/23/2021   Primary renal papillary carcinoma, right ([place]) 02/18/2020   Hepatic steatosis 02/18/2020   Major depression in remission ([place]) 11/11/2019   Central sleep apnea due to Cheyne-Stokes respiration 08/31/2019   Hyperlipidemia associated with type 2 diabetes mellitus ([place]) 07/09/2019   Aortic atherosclerosis ([place]) by CXR on 06/10/2018 06/10/2018   Type 2 diabetes mellitus with stage 3a chronic kidney disease, without long-term current use of insulin ([place]) 01/21/2018   OSA treated with BiPAP 01/03/2017   REM sleep behavior disorder 12/06/2016   CKD stage 3 due to type 2 diabetes mellitus ([place])  10/23/2016   Medication management 10/23/2016   Vitamin D deficiency 10/23/2016   Parkinson's disease 01/04/2016   Seasonal and perennial allergic rhinitis 06/21/2014   Asthma, mild intermittent, well-controlled 06/21/2014   Pseudomonas aeruginosa colonization 10/08/2012   CAD (coronary artery disease) 11/15/2011   Essential hypertension 11/15/2011   Morbid obesity with BMI of 40.0-44.9, adult ([place]) 11/15/2011    ONSET DATE: 04/20/2022 (PD screen)  REFERRING DIAG: G20.A1 (ICD-10-CM) - Parkinson's disease without dyskinesia or fluctuating manifestations   THERAPY DIAG:   Unsteadiness on feet  Other abnormalities of gait and mobility  Other symptoms and signs involving the nervous system  Rationale for Evaluation and Treatment: Rehabilitation  SUBJECTIVE:                                                                                                                                                                                             SUBJECTIVE STATEMENT: Doing okay.  Did some walking over the weekend. Pt accompanied by: self  PERTINENT HISTORY: See above  PAIN:  Are you having pain? No  PRECAUTIONS: Fall  WEIGHT BEARING RESTRICTIONS: No  FALLS: Has patient fallen in last 6 months? No  LIVING ENVIRONMENT: Lives with: lives with their spouse Lives in: House/apartment Stairs: Yes: Internal: 1 steps; none and External: 12 steps; doesn't have to go upstairs Has following equipment at home: None  PLOF: Independent, mows the grass and walks the dog, does exercises from OT.  PATIENT GOALS: Pt's goals for therapy are to improve movement and motivation for exercise following the surgery.  OBJECTIVE:    TODAY'S TREATMENT: 05/02/2022 Activity Comments  Gait training on treadmill, incline at Grade 3, 1.4 mph x 3 minutes, then decline at Grade 3, 1.4 mph x 3 minutes Cues for foot clearance, posture, heelstrike  Tandem gait progressing to tandem march forward/back Occasional unsteadiness  Monster walk forward/back at counter, progressing to SLS landing wide, then progressing to monster walk with green theraband resistance, 2-3 reps  Occasional unsteadiness with SLS landing portion of activity  Resisted sidestepping with green theraband, 5 reps along parallel bars   On Airex:   -EC Romberg stance with head turns/nods -Partial tandem stance with EO head turns/nods, EC head steady, 15 sec -wide BOS with lateral weightshift and reach, then with trunk rotation reaching -heel/toe raises 2 x 10 -hip strategy at parallel bar x 10 Increased  unsteadiness with EC and with weightshift/trunk rotation motions, min guard assist  Step strategy on Airex: -forward x 10 -side x 10 -back x 10 -forward/back x 10 UE support at parallel bars   Access Code: [email or web address] URL: [email or web address] Date: 05/02/2022 Prepared  by: [facility]  Exercises - Romberg Stance Eyes Closed on Foam Pad  - 1 x daily - 5 x weekly - 1 sets - 3 reps - 30 sec hold - Romberg Stance with Head Nods on Foam Pad  - 1 x daily - 5 x weekly - 1 sets - 10 reps - Romberg Stance on Foam Pad with Head Rotation  - 1 x daily - 5 x weekly - 1 sets - 10 reps - Forward and Backward Monster Walk with Resistance at Ankles and Counter Support  - 1 x daily - 5 x weekly - 1 sets - 3-5 reps   PATIENT EDUCATION: Education details: HEP updates  Person educated: Patient Education method: Explanation, Demonstration, and Handouts Education comprehension: verbalized understanding and returned demonstration   ------------------------------------------------------------------------------------------------ Objective measures below taken at initial evaluation: DIAGNOSTIC FINDINGS: NA  COGNITION: Overall cognitive status: Within functional limits for tasks assessed  Pt reports having to think about things more.   SENSATION: Light touch: WFL   POSTURE: rounded shoulders and forward head  LOWER EXTREMITY ROM:   WFLs  Active  Right Eval Left Eval  Hip flexion    Hip extension    Hip abduction    Hip adduction    Hip internal rotation    Hip external rotation    Knee flexion    Knee extension    Ankle dorsiflexion    Ankle plantarflexion    Ankle inversion    Ankle eversion     (Blank rows = not tested)  LOWER EXTREMITY MMT:  Grossly tested at least 4+/5 BLEs  MMT Right Eval Left Eval  Hip flexion    Hip extension    Hip abduction    Hip adduction    Hip internal rotation    Hip external rotation    Knee  flexion    Knee extension    Ankle dorsiflexion    Ankle plantarflexion    Ankle inversion    Ankle eversion    (Blank rows = not tested)   TRANSFERS: Assistive device utilized: None  Sit to stand: Modified independence Stand to sit: Modified independence  GAIT: Gait pattern: step through pattern, decreased arm swing- Right, and decreased arm swing- Left Distance walked: 50 ft x 4 Assistive device utilized: None Level of assistance: Modified independence  FUNCTIONAL TESTS:  5 times sit to stand: 10.16 sec Timed up and go (TUG): 11.78 sec 10 meter walk test: 10.56 sec (3.11 ft/sec) TUG cognitive:  13 sec TUG manual:12.03 sec MiniBEST: 25/28 FGA:  26/30        M-CTSIB  Condition 1: Firm Surface, EO 30 Sec, Normal Sway  Condition 2: Firm Surface, EC 30 Sec, Mild Sway  Condition 3: Foam Surface, EO 30 Sec, Mild Sway  Condition 4: Foam Surface, EC 30 Sec, Mild Sway     TODAY'S TREATMENT:  DATE: 04/26/2022    PATIENT EDUCATION: Education details: PT eval results, POC; initiated HEP Person educated: Patient Education method: Explanation, Demonstration, and Handouts Education comprehension: verbalized understanding and returned demonstration  HOME EXERCISE PROGRAM: Access Code: [email or web address] URL: [email or web address] Date: 04/26/2022 Prepared by: [facility]  Exercises - Romberg Stance Eyes Closed on Foam Pad  - 1 x daily - 5 x weekly - 1 sets - 3 reps - 30 sec hold - Romberg Stance with Head Nods on Foam Pad  - 1 x daily - 5 x weekly - 1 sets - 10 reps - Romberg Stance on Foam Pad with Head Rotation  - 1 x daily - 5 x weekly - 1 sets - 10 reps  GOALS: Goals reviewed with patient? Yes  SHORT TERM GOALS: = LTGs   LONG TERM GOALS: Target date: 05/19/2022  Pt will be independent with HEP for  improved balance, gait. Baseline:  Goal status: IN PROGRESS  2.  Pt will improve TUG/TUG cognitive score to less than or equal to 10% difference for decreased fall risk, improved dual tasking. Baseline: 11.78 sec, 13 sec TUG cog  Goal status: IN PROGRESS  3.  Pt will perform posterior push and release in 2 steps or less for posterior balance recovery. Baseline: 3-4 small steps to regain balance Goal status: IN PROGRESS  4.  Pt will perform eyes closed compliant surface, feet together, mild sway x 30 seconds for improved balance in shower. Baseline: 30 seconds, but mild/moderate sway Goal status: IN PROGRESS   ASSESSMENT:  CLINICAL IMPRESSION: Utilized treadmill for gait training on incline/decline surfaces, as pt reports having hills in his neighborhood where he would walk for exercise.  He performs 3 minutes on both incline and decline, with UE support and occasional cues for foot clearance.  Also worked on hip stability and compliant surface balance exercises.  He has the most unsteadiness with narrowed/partial tandem stance and EC; added to HEP to reflect working more on this at home.  OBJECTIVE IMPAIRMENTS: Abnormal gait, decreased balance, and decreased mobility.   ACTIVITY LIMITATIONS: bed mobility, bathing, hygiene/grooming, and locomotion level  PARTICIPATION LIMITATIONS: community activity  PERSONAL FACTORS: 3+ comorbidities: See PMH above  are also affecting patient's functional outcome.   REHAB POTENTIAL: Good  CLINICAL DECISION MAKING: Stable/uncomplicated  EVALUATION COMPLEXITY: Low  PLAN:  PT FREQUENCY: 2x/week  PT DURATION: 4 weeks including eval  PLANNED INTERVENTIONS: Therapeutic exercises, Therapeutic activity, Neuromuscular re-education, Balance training, Gait training, Patient/Family education, and Self Care  PLAN FOR NEXT SESSION: Review updates to HEP.  .  Continue compliant surfaces, vision removed; step strategy work for balance recovery, monster  walk with resistance, hip stability exercises.     Frazier Butt., PT 05/02/2022, 8:49 AM  [facility] at [facility], [place], [place] [address] Phone # [phone] Fax # [phone]

## 2022-05-04 ENCOUNTER — Encounter: Payer: Self-pay | Admitting: Physical Therapy

## 2022-05-04 ENCOUNTER — Ambulatory Visit: Payer: Medicare Other | Admitting: Physical Therapy

## 2022-05-04 DIAGNOSIS — R2681 Unsteadiness on feet: Secondary | ICD-10-CM | POA: Diagnosis not present

## 2022-05-04 DIAGNOSIS — R29818 Other symptoms and signs involving the nervous system: Secondary | ICD-10-CM | POA: Diagnosis not present

## 2022-05-04 DIAGNOSIS — R471 Dysarthria and anarthria: Secondary | ICD-10-CM | POA: Diagnosis not present

## 2022-05-04 DIAGNOSIS — R2689 Other abnormalities of gait and mobility: Secondary | ICD-10-CM

## 2022-05-04 NOTE — Therapy (Signed)
OUTPATIENT PHYSICAL THERAPY NEURO TREATMENT NOTE   Patient Name: Todd Rice MRN: [id number] DOB:05-23-1955, 66 y.o., male Today's Date: 05/04/2022   PCP: Unk Pinto, MD REFERRING PROVIDER: Ward Givens, NP   END OF SESSION:  PT End of Session - 05/04/22 0759     Visit Number 4    Number of Visits 8    Date for PT Re-Evaluation 05/19/22    Authorization Type UHC Medicare    PT Start Time 0758    PT Stop Time 0840    PT Time Calculation (min) 42 min    Equipment Utilized During Treatment Gait belt    Activity Tolerance Patient tolerated treatment well    Behavior During Therapy WFL for tasks assessed/performed             Past Medical History:  Diagnosis Date   Adult BMI 50.0-59.9 kg/sq m    52.77   Anal fissure    Cancer ([place])    kidney cancer   Diabetes mellitus without complication ([place])    borderline   Elevated PSA 08/13/2019   H/O: gout    STABLE   Hepatic steatosis 10/01/2011   History of kidney stones    Hyperlipidemia    Hypertension    Kidney tumor    right upper kidney tumor, monitoring   OSA on CPAP    [employer]   Parkinson disease 01/04/2016   REM sleep behavior disorder 12/06/2016   Right ureteral stone    Ureteral calculus, right 06/12/2011   Vitamin D deficiency    Weakness    Past Surgical History:  Procedure Laterality Date   ANAL FISSURE REPAIR  10-12-2000   COLONOSCOPY N/A 07/01/2012   Procedure: COLONOSCOPY;  Surgeon: Lafayette Dragon, MD;  [facility];  Service: Endoscopy;  Laterality: N/A;   CYSTOSCOPY WITH RETROGRADE PYELOGRAM, URETEROSCOPY AND STENT PLACEMENT Bilateral 02/26/2013   Procedure: CYSTOSCOPY WITH RETROGRADE PYELOGRAM, URETEROSCOPY AND STENT PLACEMENT;  Surgeon: Alexis Frock, MD;  [facility];  Service: Urology;  Laterality: Bilateral;   HOLMIUM LASER APPLICATION Bilateral 54/27/0623   Procedure: HOLMIUM LASER APPLICATION;  Surgeon: Alexis Frock, MD;  [facility];  Service:  Urology;  Laterality: Bilateral;   kidney stone removal     06/2011-stent placement   LEFT MEDIAL FEMORAL CONDYLE DEBRIDEMENT & DRILLING/ REMOVAL LOOSE BODY  09-15-2003   NASAL SEPTUM SURGERY  1985   ROBOT ASSISTED LAPAROSCOPIC NEPHRECTOMY Right 12/16/2021   Procedure: XI ROBOTIC ASSISTED LAPAROSCOPIC RADICAL NEPHRECTOMY;  Surgeon: Alexis Frock, MD;  [facility];  Service: Urology;  Laterality: Right;  3 HRS   STONE EXTRACTION WITH BASKET  06/12/2011   Procedure: STONE EXTRACTION WITH BASKET;  Surgeon: Claybon Jabs, MD;  Location: [facility];  Service: Urology;  Laterality: Right;   Patient Active Problem List   Diagnosis Date Noted   Renal mass 12/16/2021   Constipation 03/24/2021   Gout 03/23/2021   Primary renal papillary carcinoma, right ([place]) 02/18/2020   Hepatic steatosis 02/18/2020   Major depression in remission ([place]) 11/11/2019   Central sleep apnea due to Cheyne-Stokes respiration 08/31/2019   Hyperlipidemia associated with type 2 diabetes mellitus ([place]) 07/09/2019   Aortic atherosclerosis ([employer] [place]) by CXR on 06/10/2018 06/10/2018   Type 2 diabetes mellitus with stage 3a chronic kidney disease, without long-term current use of insulin ([place]) 01/21/2018   OSA treated with BiPAP 01/03/2017   REM sleep behavior disorder 12/06/2016   CKD stage 3 due to type 2 diabetes mellitus ([place])  10/23/2016   Medication management 10/23/2016   Vitamin D deficiency 10/23/2016   Parkinson's disease 01/04/2016   Seasonal and perennial allergic rhinitis 06/21/2014   Asthma, mild intermittent, well-controlled 06/21/2014   Pseudomonas aeruginosa colonization 10/08/2012   CAD (coronary artery disease) 11/15/2011   Essential hypertension 11/15/2011   Morbid obesity with BMI of 40.0-44.9, adult ([place]) 11/15/2011    ONSET DATE: 04/20/2022 (PD screen)  REFERRING DIAG: G20.A1 (ICD-10-CM) - Parkinson's disease without dyskinesia or fluctuating manifestations   THERAPY DIAG:   Unsteadiness on feet  Other abnormalities of gait and mobility  Other symptoms and signs involving the nervous system  Rationale for Evaluation and Treatment: Rehabilitation  SUBJECTIVE:                                                                                                                                                                                             SUBJECTIVE STATEMENT: Had some mild hip soreness after last visit. Pt accompanied by: self  PERTINENT HISTORY: See above  PAIN:  Are you having pain? No  PRECAUTIONS: Fall  WEIGHT BEARING RESTRICTIONS: No  FALLS: Has patient fallen in last 6 months? No  LIVING ENVIRONMENT: Lives with: lives with their spouse Lives in: House/apartment Stairs: Yes: Internal: 1 steps; none and External: 12 steps; doesn't have to go upstairs Has following equipment at home: None  PLOF: Independent, mows the grass and walks the dog, does exercises from OT.  PATIENT GOALS: Pt's goals for therapy are to improve movement and motivation for exercise following the surgery.  OBJECTIVE:    TODAY'S TREATMENT: 05/04/2022 Activity Comments  Gait 85 ft x 3 reps level ground, followed by lower extremity stretching-gastroc stretch with foot propped on 4" step, 3 x 15 seconds; stagger stance rocking for dynamic stretech of gastrocs, 10 reps   Gait training on treadmill, incline at Grade 3, 1.6 mph x 3:30 minutes, then decline at Grade 3, 1.6 mph x 3:30 minutes Cues for foot clearance, posture, heelstrike  On Airex: -Partial tandem stance EO head motions and EC  -lateral weightshifting EO and EC -ant/post weightshifting EO and EC -trunk rotation, upper body motions with EO and EC, feet apart -intermittent UE support; mild unsteadiness  Forward/back monsterwalk in parallel bars, no resistance in session today Return demo understanding  Sit<>stand x 10 reps, 2 sets 2nd set standing on Airex, cues for upright posture upon standing          Access Code: [email or web address] URL: [email or web address] Date: 05/02/2022 Prepared by: [facility]  Exercises - Romberg Stance Eyes Closed on Foam Pad  - 1  x daily - 5 x weekly - 1 sets - 3 reps - 30 sec hold - Romberg Stance with Head Nods on Foam Pad  - 1 x daily - 5 x weekly - 1 sets - 10 reps - Romberg Stance on Foam Pad with Head Rotation  - 1 x daily - 5 x weekly - 1 sets - 10 reps - Forward and Backward Monster Walk with Resistance at Ankles and Counter Support  - 1 x daily - 5 x weekly - 1 sets - 3-5 reps   PATIENT EDUCATION: Education details: HEP updates  Person educated: Patient Education method: Explanation, Demonstration, and Handouts Education comprehension: verbalized understanding and returned demonstration   ------------------------------------------------------------------------------------------------ Objective measures below taken at initial evaluation: DIAGNOSTIC FINDINGS: NA  COGNITION: Overall cognitive status: Within functional limits for tasks assessed  Pt reports having to think about things more.   SENSATION: Light touch: WFL   POSTURE: rounded shoulders and forward head  LOWER EXTREMITY ROM:   WFLs  Active  Right Eval Left Eval  Hip flexion    Hip extension    Hip abduction    Hip adduction    Hip internal rotation    Hip external rotation    Knee flexion    Knee extension    Ankle dorsiflexion    Ankle plantarflexion    Ankle inversion    Ankle eversion     (Blank rows = not tested)  LOWER EXTREMITY MMT:  Grossly tested at least 4+/5 BLEs  MMT Right Eval Left Eval  Hip flexion    Hip extension    Hip abduction    Hip adduction    Hip internal rotation    Hip external rotation    Knee flexion    Knee extension    Ankle dorsiflexion    Ankle plantarflexion    Ankle inversion    Ankle eversion    (Blank rows = not tested)   TRANSFERS: Assistive device utilized: None  Sit  to stand: Modified independence Stand to sit: Modified independence  GAIT: Gait pattern: step through pattern, decreased arm swing- Right, and decreased arm swing- Left Distance walked: 50 ft x 4 Assistive device utilized: None Level of assistance: Modified independence  FUNCTIONAL TESTS:  5 times sit to stand: 10.16 sec Timed up and go (TUG): 11.78 sec 10 meter walk test: 10.56 sec (3.11 ft/sec) TUG cognitive:  13 sec TUG manual:12.03 sec MiniBEST: 25/28 FGA:  26/30        M-CTSIB  Condition 1: Firm Surface, EO 30 Sec, Normal Sway  Condition 2: Firm Surface, EC 30 Sec, Mild Sway  Condition 3: Foam Surface, EO 30 Sec, Mild Sway  Condition 4: Foam Surface, EC 30 Sec, Mild Sway     TODAY'S TREATMENT:  DATE: 04/26/2022    PATIENT EDUCATION: Education details: PT eval results, POC; initiated HEP Person educated: Patient Education method: Explanation, Demonstration, and Handouts Education comprehension: verbalized understanding and returned demonstration  HOME EXERCISE PROGRAM: Access Code: [email or web address] URL: [email or web address] Date: 04/26/2022 Prepared by: [facility]  Exercises - Romberg Stance Eyes Closed on Foam Pad  - 1 x daily - 5 x weekly - 1 sets - 3 reps - 30 sec hold - Romberg Stance with Head Nods on Foam Pad  - 1 x daily - 5 x weekly - 1 sets - 10 reps - Romberg Stance on Foam Pad with Head Rotation  - 1 x daily - 5 x weekly - 1 sets - 10 reps  GOALS: Goals reviewed with patient? Yes  SHORT TERM GOALS: = LTGs   LONG TERM GOALS: Target date: 05/19/2022  Pt will be independent with HEP for improved balance, gait. Baseline:  Goal status: IN PROGRESS  2.  Pt will improve TUG/TUG cognitive score to less than or equal to 10% difference for decreased fall risk, improved dual  tasking. Baseline: 11.78 sec, 13 sec TUG cog  Goal status: IN PROGRESS  3.  Pt will perform posterior push and release in 2 steps or less for posterior balance recovery. Baseline: 3-4 small steps to regain balance Goal status: IN PROGRESS  4.  Pt will perform eyes closed compliant surface, feet together, mild sway x 30 seconds for improved balance in shower. Baseline: 30 seconds, but mild/moderate sway Goal status: IN PROGRESS   ASSESSMENT:  CLINICAL IMPRESSION: Continued to work on gait training over level ground and treadmill, with lower extremity/gastroc stretching.  Pt improved with heelstrike and posture on treadmill today.  Worked on compliant surfaces with wide/narrow BOS with UE motions with eyes open and vision removed (to simulate EC in shower activity).  He has mild unsteadiness with these activities.  PT continues to reinforce performance of HEP at home as well as regular walking for exercise in his neighborhood.  He is progressing towards LTGs.  OBJECTIVE IMPAIRMENTS: Abnormal gait, decreased balance, and decreased mobility.   ACTIVITY LIMITATIONS: bed mobility, bathing, hygiene/grooming, and locomotion level  PARTICIPATION LIMITATIONS: community activity  PERSONAL FACTORS: 3+ comorbidities: See PMH above  are also affecting patient's functional outcome.   REHAB POTENTIAL: Good  CLINICAL DECISION MAKING: Stable/uncomplicated  EVALUATION COMPLEXITY: Low  PLAN:  PT FREQUENCY: 2x/week  PT DURATION: 4 weeks including eval  PLANNED INTERVENTIONS: Therapeutic exercises, Therapeutic activity, Neuromuscular re-education, Balance training, Gait training, Patient/Family education, and Self Care  PLAN FOR NEXT SESSION:  Continue compliant surfaces, vision removed; step strategy work for balance recovery, monster walk with resistance, hip stability exercises.     Frazier Butt., PT 05/04/2022, 8:49 AM  [facility] at [facility] [place], [place], [place] [address] Phone # [phone] Fax # [phone]

## 2022-05-09 ENCOUNTER — Encounter: Payer: Self-pay | Admitting: Physical Therapy

## 2022-05-09 ENCOUNTER — Ambulatory Visit: Payer: Medicare Other | Attending: Adult Health | Admitting: Physical Therapy

## 2022-05-09 DIAGNOSIS — R2689 Other abnormalities of gait and mobility: Secondary | ICD-10-CM

## 2022-05-09 DIAGNOSIS — R2681 Unsteadiness on feet: Secondary | ICD-10-CM

## 2022-05-09 DIAGNOSIS — R29818 Other symptoms and signs involving the nervous system: Secondary | ICD-10-CM | POA: Diagnosis not present

## 2022-05-09 NOTE — Therapy (Signed)
OUTPATIENT PHYSICAL THERAPY NEURO TREATMENT NOTE   Patient Name: Todd Rice MRN: [id number] DOB:May 04, 1956, 67 y.o., male Today's Date: 05/09/2022   PCP: Unk Pinto, MD REFERRING PROVIDER: Ward Givens, NP   END OF SESSION:  PT End of Session - 05/09/22 0758     Visit Number 5    Number of Visits 8    Date for PT Re-Evaluation 05/19/22    Authorization Type UHC Medicare    PT Start Time 0759    PT Stop Time 0840    PT Time Calculation (min) 41 min    Equipment Utilized During Treatment Gait belt    Activity Tolerance Patient tolerated treatment well    Behavior During Therapy WFL for tasks assessed/performed             Past Medical History:  Diagnosis Date   Adult BMI 50.0-59.9 kg/sq m    52.77   Anal fissure    Cancer ([place])    kidney cancer   Diabetes mellitus without complication ([place])    borderline   Elevated PSA 08/13/2019   H/O: gout    STABLE   Hepatic steatosis 10/01/2011   History of kidney stones    Hyperlipidemia    Hypertension    Kidney tumor    right upper kidney tumor, monitoring   OSA on CPAP    [employer]   Parkinson disease 01/04/2016   REM sleep behavior disorder 12/06/2016   Right ureteral stone    Ureteral calculus, right 06/12/2011   Vitamin D deficiency    Weakness    Past Surgical History:  Procedure Laterality Date   ANAL FISSURE REPAIR  10-12-2000   COLONOSCOPY N/A 07/01/2012   Procedure: COLONOSCOPY;  Surgeon: Lafayette Dragon, MD;  [facility];  Service: Endoscopy;  Laterality: N/A;   CYSTOSCOPY WITH RETROGRADE PYELOGRAM, URETEROSCOPY AND STENT PLACEMENT Bilateral 02/26/2013   Procedure: CYSTOSCOPY WITH RETROGRADE PYELOGRAM, URETEROSCOPY AND STENT PLACEMENT;  Surgeon: Alexis Frock, MD;  [facility];  Service: Urology;  Laterality: Bilateral;   HOLMIUM LASER APPLICATION Bilateral 34/19/6222   Procedure: HOLMIUM LASER APPLICATION;  Surgeon: Alexis Frock, MD;  [facility];  Service: Urology;   Laterality: Bilateral;   kidney stone removal     06/2011-stent placement   LEFT MEDIAL FEMORAL CONDYLE DEBRIDEMENT & DRILLING/ REMOVAL LOOSE BODY  09-15-2003   NASAL SEPTUM SURGERY  1985   ROBOT ASSISTED LAPAROSCOPIC NEPHRECTOMY Right 12/16/2021   Procedure: XI ROBOTIC ASSISTED LAPAROSCOPIC RADICAL NEPHRECTOMY;  Surgeon: Alexis Frock, MD;  [facility];  Service: Urology;  Laterality: Right;  3 HRS   STONE EXTRACTION WITH BASKET  06/12/2011   Procedure: STONE EXTRACTION WITH BASKET;  Surgeon: Claybon Jabs, MD;  Location: [facility];  Service: Urology;  Laterality: Right;   Patient Active Problem List   Diagnosis Date Noted   Renal mass 12/16/2021   Constipation 03/24/2021   Gout 03/23/2021   Primary renal papillary carcinoma, right ([place]) 02/18/2020   Hepatic steatosis 02/18/2020   Major depression in remission ([place]) 11/11/2019   Central sleep apnea due to Cheyne-Stokes respiration 08/31/2019   Hyperlipidemia associated with type 2 diabetes mellitus ([place]) 07/09/2019   Aortic atherosclerosis ([place]) by CXR on 06/10/2018 06/10/2018   Type 2 diabetes mellitus with stage 3a chronic kidney disease, without long-term current use of insulin ([place]) 01/21/2018   OSA treated with BiPAP 01/03/2017   REM sleep behavior disorder 12/06/2016   CKD stage 3 due to type 2 diabetes mellitus ([place])  10/23/2016   Medication management 10/23/2016   Vitamin D deficiency 10/23/2016   Parkinson's disease 01/04/2016   Seasonal and perennial allergic rhinitis 06/21/2014   Asthma, mild intermittent, well-controlled 06/21/2014   Pseudomonas aeruginosa colonization 10/08/2012   CAD (coronary artery disease) 11/15/2011   Essential hypertension 11/15/2011   Morbid obesity with BMI of 40.0-44.9, adult ([place]) 11/15/2011    ONSET DATE: 04/20/2022 (PD screen)  REFERRING DIAG: G20.A1 (ICD-10-CM) - Parkinson's disease without dyskinesia or fluctuating manifestations   THERAPY DIAG:  Unsteadiness  on feet  Other abnormalities of gait and mobility  Other symptoms and signs involving the nervous system  Rationale for Evaluation and Treatment: Rehabilitation  SUBJECTIVE:                                                                                                                                                                                             SUBJECTIVE STATEMENT: Have had a hard weekend with some deaths and taking care of wife. Pt accompanied by: self  PERTINENT HISTORY: See above  PAIN:  Are you having pain? No  PRECAUTIONS: Fall  WEIGHT BEARING RESTRICTIONS: No  FALLS: Has patient fallen in last 6 months? No  LIVING ENVIRONMENT: Lives with: lives with their spouse Lives in: House/apartment Stairs: Yes: Internal: 1 steps; none and External: 12 steps; doesn't have to go upstairs Has following equipment at home: None  PLOF: Independent, mows the grass and walks the dog, does exercises from OT.  PATIENT GOALS: Pt's goals for therapy are to improve movement and motivation for exercise following the surgery.  OBJECTIVE:    TODAY'S TREATMENT: 05/09/2022 Activity Comments  Standing on Airex: -Feet together with EC head turns/nods, then EO and head diagonals -Partial tandem stance with EC head turns/nods, then EO and head diagonals -Step strategy forward, back, side x 10 reps, alternating legs -Forward>back step and weightshift x 10 reps  -sidestep over and off cushion x 10 reps        -intermittent UE support  -intermittent UE support  Standing on incline/decline: -EO head turns/nods x 5 -EC head turns/nods x 5 -marching in place x 10 reps -forward/back step and weightshifting x 10 reps -occasional cues for increased step length in posterior direction  With 3# weights: -marching forward -forward/back walking -sidestepping -monster walk No UE support, brief breaks in between  Sit<>stand 5 reps, 3 sets 3rd set standing on Airex                 Access Code: [email or web address] URL: [email or web address] Date: 05/02/2022 Prepared by: [facility]  Exercises - Romberg Stance  Eyes Closed on Foam Pad  - 1 x daily - 5 x weekly - 1 sets - 3 reps - 30 sec hold - Romberg Stance with Head Nods on Foam Pad  - 1 x daily - 5 x weekly - 1 sets - 10 reps - Romberg Stance on Foam Pad with Head Rotation  - 1 x daily - 5 x weekly - 1 sets - 10 reps - Forward and Backward Monster Walk with Resistance at Ankles and Counter Support  - 1 x daily - 5 x weekly - 1 sets - 3-5 reps   PATIENT EDUCATION: Education details: HEP updates  Person educated: Patient Education method: Explanation, Demonstration, and Handouts Education comprehension: verbalized understanding and returned demonstration   ------------------------------------------------------------------------------------------------ Objective measures below taken at initial evaluation: DIAGNOSTIC FINDINGS: NA  COGNITION: Overall cognitive status: Within functional limits for tasks assessed  Pt reports having to think about things more.   SENSATION: Light touch: WFL   POSTURE: rounded shoulders and forward head  LOWER EXTREMITY ROM:   WFLs  Active  Right Eval Left Eval  Hip flexion    Hip extension    Hip abduction    Hip adduction    Hip internal rotation    Hip external rotation    Knee flexion    Knee extension    Ankle dorsiflexion    Ankle plantarflexion    Ankle inversion    Ankle eversion     (Blank rows = not tested)  LOWER EXTREMITY MMT:  Grossly tested at least 4+/5 BLEs  MMT Right Eval Left Eval  Hip flexion    Hip extension    Hip abduction    Hip adduction    Hip internal rotation    Hip external rotation    Knee flexion    Knee extension    Ankle dorsiflexion    Ankle plantarflexion    Ankle inversion    Ankle eversion    (Blank rows = not tested)   TRANSFERS: Assistive device utilized:  None  Sit to stand: Modified independence Stand to sit: Modified independence  GAIT: Gait pattern: step through pattern, decreased arm swing- Right, and decreased arm swing- Left Distance walked: 50 ft x 4 Assistive device utilized: None Level of assistance: Modified independence  FUNCTIONAL TESTS:  5 times sit to stand: 10.16 sec Timed up and go (TUG): 11.78 sec 10 meter walk test: 10.56 sec (3.11 ft/sec) TUG cognitive:  13 sec TUG manual:12.03 sec MiniBEST: 25/28 FGA:  26/30        M-CTSIB  Condition 1: Firm Surface, EO 30 Sec, Normal Sway  Condition 2: Firm Surface, EC 30 Sec, Mild Sway  Condition 3: Foam Surface, EO 30 Sec, Mild Sway  Condition 4: Foam Surface, EC 30 Sec, Mild Sway     TODAY'S TREATMENT:  DATE: 04/26/2022    PATIENT EDUCATION: Education details: PT eval results, POC; initiated HEP Person educated: Patient Education method: Explanation, Demonstration, and Handouts Education comprehension: verbalized understanding and returned demonstration  HOME EXERCISE PROGRAM: Access Code: [email or web address] URL: [email or web address] Date: 04/26/2022 Prepared by: [facility]  Exercises - Romberg Stance Eyes Closed on Foam Pad  - 1 x daily - 5 x weekly - 1 sets - 3 reps - 30 sec hold - Romberg Stance with Head Nods on Foam Pad  - 1 x daily - 5 x weekly - 1 sets - 10 reps - Romberg Stance on Foam Pad with Head Rotation  - 1 x daily - 5 x weekly - 1 sets - 10 reps  GOALS: Goals reviewed with patient? Yes  SHORT TERM GOALS: = LTGs   LONG TERM GOALS: Target date: 05/19/2022  Pt will be independent with HEP for improved balance, gait. Baseline:  Goal status: IN PROGRESS  2.  Pt will improve TUG/TUG cognitive score to less than or equal to 10% difference for decreased fall risk, improved dual  tasking. Baseline: 11.78 sec, 13 sec TUG cog  Goal status: IN PROGRESS  3.  Pt will perform posterior push and release in 2 steps or less for posterior balance recovery. Baseline: 3-4 small steps to regain balance Goal status: IN PROGRESS  4.  Pt will perform eyes closed compliant surface, feet together, mild sway x 30 seconds for improved balance in shower. Baseline: 30 seconds, but mild/moderate sway Goal status: IN PROGRESS   ASSESSMENT:  CLINICAL IMPRESSION: Skilled PT session today focused on compliant surface exercises in static and step strategy positions.  Occasional mild LOB with stepping exercises forward>back, with pt able to regain on his own.  Added weights to dynamic standing exercises, and pt tolerates well.  He is progressing well towards goals for improved balance and functional mobility.  OBJECTIVE IMPAIRMENTS: Abnormal gait, decreased balance, and decreased mobility.   ACTIVITY LIMITATIONS: bed mobility, bathing, hygiene/grooming, and locomotion level  PARTICIPATION LIMITATIONS: community activity  PERSONAL FACTORS: 3+ comorbidities: See PMH above  are also affecting patient's functional outcome.   REHAB POTENTIAL: Good  CLINICAL DECISION MAKING: Stable/uncomplicated  EVALUATION COMPLEXITY: Low  PLAN:  PT FREQUENCY: 2x/week  PT DURATION: 4 weeks including eval  PLANNED INTERVENTIONS: Therapeutic exercises, Therapeutic activity, Neuromuscular re-education, Balance training, Gait training, Patient/Family education, and Self Care  PLAN FOR NEXT SESSION:  Continue compliant surfaces, vision removed; step strategy work for balance recovery, monster walk with resistance, hip stability exercises.  Update HEP as needed; discuss PD exercise program   Cassady Stanczak W., PT 05/09/2022, 8:40 AM  [facility] at [facility] [place], [place], [place] [address] Phone # [phone] Fax # [phone]

## 2022-05-11 ENCOUNTER — Ambulatory Visit: Payer: Medicare Other | Admitting: Physical Therapy

## 2022-05-11 ENCOUNTER — Encounter: Payer: Self-pay | Admitting: Physical Therapy

## 2022-05-11 DIAGNOSIS — R2681 Unsteadiness on feet: Secondary | ICD-10-CM | POA: Diagnosis not present

## 2022-05-11 DIAGNOSIS — R2689 Other abnormalities of gait and mobility: Secondary | ICD-10-CM

## 2022-05-11 DIAGNOSIS — R29818 Other symptoms and signs involving the nervous system: Secondary | ICD-10-CM | POA: Diagnosis not present

## 2022-05-11 NOTE — Therapy (Signed)
OUTPATIENT PHYSICAL THERAPY NEURO TREATMENT NOTE   Patient Name: Todd Rice MRN: [id number] DOB:09/20/1955, 67 y.o., male Today's Date: 05/11/2022   PCP: Unk Pinto, MD REFERRING PROVIDER: Ward Givens, NP   END OF SESSION:  PT End of Session - 05/11/22 0753     Visit Number 6    Number of Visits 8    Date for PT Re-Evaluation 05/19/22    Authorization Type UHC Medicare    PT Start Time 0758    PT Stop Time 0843    PT Time Calculation (min) 45 min    Equipment Utilized During Treatment --    Activity Tolerance Patient tolerated treatment well    Behavior During Therapy [facility] for tasks assessed/performed             Past Medical History:  Diagnosis Date   Adult BMI 50.0-59.9 kg/sq m    52.77   Anal fissure    Cancer ([place])    kidney cancer   Diabetes mellitus without complication ([place])    borderline   Elevated PSA 08/13/2019   H/O: gout    STABLE   Hepatic steatosis 10/01/2011   History of kidney stones    Hyperlipidemia    Hypertension    Kidney tumor    right upper kidney tumor, monitoring   OSA on CPAP    [employer]   Parkinson disease 01/04/2016   REM sleep behavior disorder 12/06/2016   Right ureteral stone    Ureteral calculus, right 06/12/2011   Vitamin D deficiency    Weakness    Past Surgical History:  Procedure Laterality Date   ANAL FISSURE REPAIR  10-12-2000   COLONOSCOPY N/A 07/01/2012   Procedure: COLONOSCOPY;  Surgeon: Lafayette Dragon, MD;  [facility];  Service: Endoscopy;  Laterality: N/A;   CYSTOSCOPY WITH RETROGRADE PYELOGRAM, URETEROSCOPY AND STENT PLACEMENT Bilateral 02/26/2013   Procedure: CYSTOSCOPY WITH RETROGRADE PYELOGRAM, URETEROSCOPY AND STENT PLACEMENT;  Surgeon: Alexis Frock, MD;  [facility];  Service: Urology;  Laterality: Bilateral;   HOLMIUM LASER APPLICATION Bilateral 52/77/8242   Procedure: HOLMIUM LASER APPLICATION;  Surgeon: Alexis Frock, MD;  [facility];  Service: Urology;   Laterality: Bilateral;   kidney stone removal     06/2011-stent placement   LEFT MEDIAL FEMORAL CONDYLE DEBRIDEMENT & DRILLING/ REMOVAL LOOSE BODY  09-15-2003   NASAL SEPTUM SURGERY  1985   ROBOT ASSISTED LAPAROSCOPIC NEPHRECTOMY Right 12/16/2021   Procedure: XI ROBOTIC ASSISTED LAPAROSCOPIC RADICAL NEPHRECTOMY;  Surgeon: Alexis Frock, MD;  [facility];  Service: Urology;  Laterality: Right;  3 HRS   STONE EXTRACTION WITH BASKET  06/12/2011   Procedure: STONE EXTRACTION WITH BASKET;  Surgeon: Claybon Jabs, MD;  Location: [facility];  Service: Urology;  Laterality: Right;   Patient Active Problem List   Diagnosis Date Noted   Renal mass 12/16/2021   Constipation 03/24/2021   Gout 03/23/2021   Primary renal papillary carcinoma, right ([place]) 02/18/2020   Hepatic steatosis 02/18/2020   Major depression in remission ([place]) 11/11/2019   Central sleep apnea due to Cheyne-Stokes respiration 08/31/2019   Hyperlipidemia associated with type 2 diabetes mellitus ([place]) 07/09/2019   Aortic atherosclerosis ([place]) by CXR on 06/10/2018 06/10/2018   Type 2 diabetes mellitus with stage 3a chronic kidney disease, without long-term current use of insulin ([place]) 01/21/2018   OSA treated with BiPAP 01/03/2017   REM sleep behavior disorder 12/06/2016   CKD stage 3 due to type 2 diabetes mellitus ([place]) 10/23/2016  Medication management 10/23/2016   Vitamin D deficiency 10/23/2016   Parkinson's disease 01/04/2016   Seasonal and perennial allergic rhinitis 06/21/2014   Asthma, mild intermittent, well-controlled 06/21/2014   Pseudomonas aeruginosa colonization 10/08/2012   CAD (coronary artery disease) 11/15/2011   Essential hypertension 11/15/2011   Morbid obesity with BMI of 40.0-44.9, adult ([place]) 11/15/2011    ONSET DATE: 04/20/2022 (PD screen)  REFERRING DIAG: G20.A1 (ICD-10-CM) - Parkinson's disease without dyskinesia or fluctuating manifestations   THERAPY DIAG:  Unsteadiness on  feet  Other abnormalities of gait and mobility  Other symptoms and signs involving the nervous system  Rationale for Evaluation and Treatment: Rehabilitation  SUBJECTIVE:                                                                                                                                                                                             SUBJECTIVE STATEMENT: Nothing new. Pt accompanied by: self  PERTINENT HISTORY: See above  PAIN:  Are you having pain? No  PRECAUTIONS: Fall  WEIGHT BEARING RESTRICTIONS: No  FALLS: Has patient fallen in last 6 months? No  LIVING ENVIRONMENT: Lives with: lives with their spouse Lives in: House/apartment Stairs: Yes: Internal: 1 steps; none and External: 12 steps; doesn't have to go upstairs Has following equipment at home: None  PLOF: Independent, mows the grass and walks the dog, does exercises from OT.  PATIENT GOALS: Pt's goals for therapy are to improve movement and motivation for exercise following the surgery.  OBJECTIVE:    TODAY'S TREATMENT: 05/11/2022 Activity Comments  Standing on Aire[employer]: -marching in place 2 x 10 reps -Step strategy forward, back, side x 10 reps, alternating legs -Forward>back step and weightshift x 10 reps  -sidestep over and off cushion x 10 reps Intermittent UE support  Standing on incline/decline: -EO head turns/nods x 5 -EC head turns/nods x 5 -forward/back step and weightshifting x 10 reps Light UE support   Stretches to BLEs: -gastroc stretch-runner's stretch 2 x 30" -gastroc stretch on step 2 x 30" -standing hamstring stretch with ant/posterior lean, 5 reps 15"   Circuit for aerobic activity, with pt performing 2 min each: -forward/back walking -sidestepping-with cues for longer strides, added coordinated arm motions   Mid-activity vitals:  96% O2, HR 103   After activity vitals:  97% O2, 111 bpm         Access Code: [email or web address] URL:  [email or web address] Date: 05/11/2022 Prepared by: [facility]  Program Notes Standing on your cushion:-marching in place x 10-alternating forward step taps x 10-alternating side step taps x 10-alternating back step taps x  10*Do this standing by the counter for support as needed  Exercises - Romberg Stance Eyes Closed on Foam Pad  - 1 x daily - 5 x weekly - 1 sets - 3 reps - 30 sec hold - Romberg Stance with Head Nods on Foam Pad  - 1 x daily - 5 x weekly - 1 sets - 10 reps - Romberg Stance on Foam Pad with Head Rotation  - 1 x daily - 5 x weekly - 1 sets - 10 reps - Forward and Backward Monster Walk with Resistance at Ankles and Counter Support  - 1 x daily - 5 x weekly - 1 sets - 3-5 reps - Gastroc Stretch on Wall  - 1-2 x daily - 7 x weekly - 1 sets - 3 reps - 30 sec hold - Standing Bilateral Gastroc Stretch with Step  - 1-2 x daily - 7 x weekly - 1 sets - 30 reps - 30 sec hold - Standing Lumbar Spine Flexion Stretch Counter  - 1-2 x daily - 7 x weekly - 1 sets - 5 reps - 15 sec hold  PATIENT EDUCATION: Education details: Optimal fitness program for PD-aerobics, stretching, strengthening, walking program; additions to HEP Person educated: Patient Education method: Explanation, Demonstration, and Handouts Education comprehension: verbalized understanding and returned demonstration   ------------------------------------------------------------------------------------------------ Objective measures below taken at initial evaluation: DIAGNOSTIC FINDINGS: NA  COGNITION: Overall cognitive status: Within functional limits for tasks assessed  Pt reports having to think about things more.   SENSATION: Light touch: WFL   POSTURE: rounded shoulders and forward head  LOWER EXTREMITY ROM:   WFLs  Active  Right Eval Left Eval  Hip flexion    Hip extension    Hip abduction    Hip adduction    Hip internal rotation    Hip external  rotation    Knee flexion    Knee extension    Ankle dorsiflexion    Ankle plantarflexion    Ankle inversion    Ankle eversion     (Blank rows = not tested)  LOWER EXTREMITY MMT:  Grossly tested at least 4+/5 BLEs  MMT Right Eval Left Eval  Hip flexion    Hip extension    Hip abduction    Hip adduction    Hip internal rotation    Hip external rotation    Knee flexion    Knee extension    Ankle dorsiflexion    Ankle plantarflexion    Ankle inversion    Ankle eversion    (Blank rows = not tested)   TRANSFERS: Assistive device utilized: None  Sit to stand: Modified independence Stand to sit: Modified independence  GAIT: Gait pattern: step through pattern, decreased arm swing- Right, and decreased arm swing- Left Distance walked: 50 ft x 4 Assistive device utilized: None Level of assistance: Modified independence  FUNCTIONAL TESTS:  5 times sit to stand: 10.16 sec Timed up and go (TUG): 11.78 sec 10 meter walk test: 10.56 sec (3.11 ft/sec) TUG cognitive:  13 sec TUG manual:12.03 sec MiniBEST: 25/28 FGA:  26/30        M-CTSIB  Condition 1: Firm Surface, EO 30 Sec, Normal Sway  Condition 2: Firm Surface, EC 30 Sec, Mild Sway  Condition 3: Foam Surface, EO 30 Sec, Mild Sway  Condition 4: Foam Surface, EC 30 Sec, Mild Sway     TODAY'S TREATMENT:  DATE: 04/26/2022    PATIENT EDUCATION: Education details: PT eval results, POC; initiated HEP Person educated: Patient Education method: Explanation, Demonstration, and Handouts Education comprehension: verbalized understanding and returned demonstration  HOME EXERCISE PROGRAM: Access Code: [email or web address] URL: [email or web address] Date: 04/26/2022 Prepared by: [facility]  Exercises - Romberg Stance Eyes Closed on Foam Pad  - 1 x daily - 5  x weekly - 1 sets - 3 reps - 30 sec hold - Romberg Stance with Head Nods on Foam Pad  - 1 x daily - 5 x weekly - 1 sets - 10 reps - Romberg Stance on Foam Pad with Head Rotation  - 1 x daily - 5 x weekly - 1 sets - 10 reps  GOALS: Goals reviewed with patient? Yes  SHORT TERM GOALS: = LTGs   LONG TERM GOALS: Target date: 05/19/2022  Pt will be independent with HEP for improved balance, gait. Baseline:  Goal status: IN PROGRESS  2.  Pt will improve TUG/TUG cognitive score to less than or equal to 10% difference for decreased fall risk, improved dual tasking. Baseline: 11.78 sec, 13 sec TUG cog  Goal status: IN PROGRESS  3.  Pt will perform posterior push and release in 2 steps or less for posterior balance recovery. Baseline: 3-4 small steps to regain balance Goal status: IN PROGRESS  4.  Pt will perform eyes closed compliant surface, feet together, mild sway x 30 seconds for improved balance in shower. Baseline: 30 seconds, but mild/moderate sway Goal status: IN PROGRESS   ASSESSMENT:  CLINICAL IMPRESSION: Skilled PT session today focused on balance exercises with additions to HEP, with additional education in optimal PD fitness for home.  Educated pt on ways to incorporate aerobic activity, strengthening, stretching into his current routine.  Pt demonstrates good understanding.  He will continue to benefit from skilled PT towards goals for improved balance and functional mobility.  OBJECTIVE IMPAIRMENTS: Abnormal gait, decreased balance, and decreased mobility.   ACTIVITY LIMITATIONS: bed mobility, bathing, hygiene/grooming, and locomotion level  PARTICIPATION LIMITATIONS: community activity  PERSONAL FACTORS: 3+ comorbidities: See PMH above  are also affecting patient's functional outcome.   REHAB POTENTIAL: Good  CLINICAL DECISION MAKING: Stable/uncomplicated  EVALUATION COMPLEXITY: Low  PLAN:  PT FREQUENCY: 2x/week  PT DURATION: 4 weeks including eval  PLANNED  INTERVENTIONS: Therapeutic exercises, Therapeutic activity, Neuromuscular re-education, Balance training, Gait training, Patient/Family education, and Self Care  PLAN FOR NEXT SESSION:  Review updates to HEP; Continue compliant surfaces, vision removed; step strategy work for balance recovery, monster walk with resistance, hip stability exercises.     Frazier Butt., PT 05/11/2022, 8:44 AM  [facility] at [facility], [address] [place], [place] [address] Phone # [phone] Fax # [phone]

## 2022-05-15 ENCOUNTER — Other Ambulatory Visit (HOSPITAL_BASED_OUTPATIENT_CLINIC_OR_DEPARTMENT_OTHER): Payer: Self-pay

## 2022-05-16 ENCOUNTER — Ambulatory Visit: Payer: Medicare Other | Admitting: Physical Therapy

## 2022-05-16 ENCOUNTER — Encounter: Payer: Self-pay | Admitting: Physical Therapy

## 2022-05-16 DIAGNOSIS — R2681 Unsteadiness on feet: Secondary | ICD-10-CM | POA: Diagnosis not present

## 2022-05-16 DIAGNOSIS — R2689 Other abnormalities of gait and mobility: Secondary | ICD-10-CM

## 2022-05-16 DIAGNOSIS — R29818 Other symptoms and signs involving the nervous system: Secondary | ICD-10-CM | POA: Diagnosis not present

## 2022-05-16 NOTE — Therapy (Signed)
OUTPATIENT PHYSICAL THERAPY NEURO TREATMENT NOTE   Patient Name: Todd Rice MRN: [id number] DOB:[birth year], 67 y.o., male Today's Date: 05/16/2022   PCP: Unk Pinto, MD REFERRING PROVIDER: Ward Givens, NP   END OF SESSION:  PT End of Session - 05/16/22 0801     Visit Number 7    Number of Visits 8    Date for PT Re-Evaluation 05/19/22    Authorization Type UHC Medicare    PT Start Time 0801    PT Stop Time 0844    PT Time Calculation (min) 43 min    Activity Tolerance Patient tolerated treatment well    Behavior During Therapy [facility] for tasks assessed/performed             Past Medical History:  Diagnosis Date   Adult BMI 50.0-59.9 kg/sq m    52.77   Anal fissure    Cancer ([place])    kidney cancer   Diabetes mellitus without complication ([place])    borderline   Elevated PSA 08/13/2019   H/O: gout    STABLE   Hepatic steatosis 10/01/2011   History of kidney stones    Hyperlipidemia    Hypertension    Kidney tumor    right upper kidney tumor, monitoring   OSA on CPAP    [employer]   Parkinson disease 01/04/2016   REM sleep behavior disorder 12/06/2016   Right ureteral stone    Ureteral calculus, right 06/12/2011   Vitamin D deficiency    Weakness    Past Surgical History:  Procedure Laterality Date   ANAL FISSURE REPAIR  10-12-2000   COLONOSCOPY N/A 07/01/2012   Procedure: COLONOSCOPY;  Surgeon: Lafayette Dragon, MD;  [facility];  Service: Endoscopy;  Laterality: N/A;   CYSTOSCOPY WITH RETROGRADE PYELOGRAM, URETEROSCOPY AND STENT PLACEMENT Bilateral 02/26/2013   Procedure: CYSTOSCOPY WITH RETROGRADE PYELOGRAM, URETEROSCOPY AND STENT PLACEMENT;  Surgeon: Alexis Frock, MD;  [facility];  Service: Urology;  Laterality: Bilateral;   HOLMIUM LASER APPLICATION Bilateral 99/83/3825   Procedure: HOLMIUM LASER APPLICATION;  Surgeon: Alexis Frock, MD;  [facility];  Service: Urology;  Laterality: Bilateral;   kidney stone removal      06/2011-stent placement   LEFT MEDIAL FEMORAL CONDYLE DEBRIDEMENT & DRILLING/ REMOVAL LOOSE BODY  09-15-2003   NASAL SEPTUM SURGERY  1985   ROBOT ASSISTED LAPAROSCOPIC NEPHRECTOMY Right 12/16/2021   Procedure: XI ROBOTIC ASSISTED LAPAROSCOPIC RADICAL NEPHRECTOMY;  Surgeon: Alexis Frock, MD;  [facility];  Service: Urology;  Laterality: Right;  3 HRS   STONE EXTRACTION WITH BASKET  06/12/2011   Procedure: STONE EXTRACTION WITH BASKET;  Surgeon: Claybon Jabs, MD;  Location: [facility];  Service: Urology;  Laterality: Right;   Patient Active Problem List   Diagnosis Date Noted   Renal mass 12/16/2021   Constipation 03/24/2021   Gout 03/23/2021   Primary renal papillary carcinoma, right ([place]) 02/18/2020   Hepatic steatosis 02/18/2020   Major depression in remission ([place]) 11/11/2019   Central sleep apnea due to Cheyne-Stokes respiration 08/31/2019   Hyperlipidemia associated with type 2 diabetes mellitus ([place]) 07/09/2019   Aortic atherosclerosis ([place]) by CXR on 06/10/2018 06/10/2018   Type 2 diabetes mellitus with stage 3a chronic kidney disease, without long-term current use of insulin ([place]) 01/21/2018   OSA treated with BiPAP 01/03/2017   REM sleep behavior disorder 12/06/2016   CKD stage 3 due to type 2 diabetes mellitus ([place]) 10/23/2016   Medication management 10/23/2016   Vitamin  D deficiency 10/23/2016   Parkinson's disease 01/04/2016   Seasonal and perennial allergic rhinitis 06/21/2014   Asthma, mild intermittent, well-controlled 06/21/2014   Pseudomonas aeruginosa colonization 10/08/2012   CAD (coronary artery disease) 11/15/2011   Essential hypertension 11/15/2011   Morbid obesity with BMI of 40.0-44.9, adult ([place]) 11/15/2011    ONSET DATE: 04/20/2022 (PD screen)  REFERRING DIAG: G20.A1 (ICD-10-CM) - Parkinson's disease without dyskinesia or fluctuating manifestations   THERAPY DIAG:  Unsteadiness on feet  Other abnormalities of gait and  mobility  Other symptoms and signs involving the nervous system  Rationale for Evaluation and Treatment: Rehabilitation  SUBJECTIVE:                                                                                                                                                                                             SUBJECTIVE STATEMENT: Things good.  Except, still have trouble in the morning shower.  A little harder time moving the leg. Pt accompanied by: self  PERTINENT HISTORY: See above  PAIN:  Are you having pain? No  PRECAUTIONS: Fall  WEIGHT BEARING RESTRICTIONS: No  FALLS: Has patient fallen in last 6 months? No  LIVING ENVIRONMENT: Lives with: lives with their spouse Lives in: House/apartment Stairs: Yes: Internal: 1 steps; none and External: 12 steps; doesn't have to go upstairs Has following equipment at home: None  PLOF: Independent, mows the grass and walks the dog, does exercises from OT.  PATIENT GOALS: Pt's goals for therapy are to improve movement and motivation for exercise following the surgery.  OBJECTIVE:    TODAY'S TREATMENT: 05/16/2022 Activity Comments  Initial walking in gym for warm-up, 5 x 85 ft, then 6 x 50 ft   Reviewed stretches added to HEP last visit Pt return demo understanding  Reviewed additions to compliant surfaces for HEP -marching in place 2 x 10 reps -Step strategy forward, back, side x 10 reps, alternating legs -Forward>back step and weightshift x 10 reps  Pt return demo understanding  Practiced lateral weightshifting, then added in rocking to turn   Quarter turns> progressing to 180 degree turns Pt prefers this method to initiate turns in shower  Circuit for aerobic/dual task activity, with pt performing 2 min each: -forward walking with ball toss and catch -forward walk with hula hoop UE lifts and trunk rotation -forward step and stop with reciprocal arm swing -additional forward walk with BIG UE movements with ball  circles/open and close arms with cognitive naming activity Mid-activity vitals:  96% O2, HR 104   After activity vitals:  98% O2, 101 bpm  Access Code: [email or web address] URL: [email or web address] Date: 05/11/2022 Prepared by: [facility]  Program Notes Standing on your cushion:-marching in place x 10-alternating forward step taps x 10-alternating side step taps x 10-alternating back step taps x 10*Do this standing by the counter for support as needed  Exercises - Romberg Stance Eyes Closed on Foam Pad  - 1 x daily - 5 x weekly - 1 sets - 3 reps - 30 sec hold - Romberg Stance with Head Nods on Foam Pad  - 1 x daily - 5 x weekly - 1 sets - 10 reps - Romberg Stance on Foam Pad with Head Rotation  - 1 x daily - 5 x weekly - 1 sets - 10 reps - Forward and Backward Monster Walk with Resistance at Ankles and Counter Support  - 1 x daily - 5 x weekly - 1 sets - 3-5 reps - Gastroc Stretch on Wall  - 1-2 x daily - 7 x weekly - 1 sets - 3 reps - 30 sec hold - Standing Bilateral Gastroc Stretch with Step  - 1-2 x daily - 7 x weekly - 1 sets - 30 reps - 30 sec hold - Standing Lumbar Spine Flexion Stretch Counter  - 1-2 x daily - 7 x weekly - 1 sets - 5 reps - 15 sec hold    ------------------------------------------------------------------------------------------------ Objective measures below taken at initial evaluation: DIAGNOSTIC FINDINGS: NA  COGNITION: Overall cognitive status: Within functional limits for tasks assessed  Pt reports having to think about things more.   SENSATION: Light touch: WFL   POSTURE: rounded shoulders and forward head  LOWER EXTREMITY ROM:   WFLs  Active  Right Eval Left Eval  Hip flexion    Hip extension    Hip abduction    Hip adduction    Hip internal rotation    Hip external rotation    Knee flexion    Knee extension    Ankle dorsiflexion    Ankle plantarflexion    Ankle inversion    Ankle  eversion     (Blank rows = not tested)  LOWER EXTREMITY MMT:  Grossly tested at least 4+/5 BLEs  MMT Right Eval Left Eval  Hip flexion    Hip extension    Hip abduction    Hip adduction    Hip internal rotation    Hip external rotation    Knee flexion    Knee extension    Ankle dorsiflexion    Ankle plantarflexion    Ankle inversion    Ankle eversion    (Blank rows = not tested)   TRANSFERS: Assistive device utilized: None  Sit to stand: Modified independence Stand to sit: Modified independence  GAIT: Gait pattern: step through pattern, decreased arm swing- Right, and decreased arm swing- Left Distance walked: 50 ft x 4 Assistive device utilized: None Level of assistance: Modified independence  FUNCTIONAL TESTS:  5 times sit to stand: 10.16 sec Timed up and go (TUG): 11.78 sec 10 meter walk test: 10.56 sec (3.11 ft/sec) TUG cognitive:  13 sec TUG manual:12.03 sec MiniBEST: 25/28 FGA:  26/30        M-CTSIB  Condition 1: Firm Surface, EO 30 Sec, Normal Sway  Condition 2: Firm Surface, EC 30 Sec, Mild Sway  Condition 3: Foam Surface, EO 30 Sec, Mild Sway  Condition 4: Foam Surface, EC 30 Sec, Mild Sway     TODAY'S TREATMENT:  DATE: 04/26/2022    PATIENT EDUCATION: Education details: PT eval results, POC; initiated HEP Person educated: Patient Education method: Explanation, Demonstration, and Handouts Education comprehension: verbalized understanding and returned demonstration  HOME EXERCISE PROGRAM: Access Code: [email or web address] URL: [email or web address] Date: 04/26/2022 Prepared by: [facility]  Exercises - Romberg Stance Eyes Closed on Foam Pad  - 1 x daily - 5 x weekly - 1 sets - 3 reps - 30 sec hold - Romberg Stance with Head Nods on Foam Pad  - 1 x daily - 5 x weekly - 1 sets -  10 reps - Romberg Stance on Foam Pad with Head Rotation  - 1 x daily - 5 x weekly - 1 sets - 10 reps  GOALS: Goals reviewed with patient? Yes  SHORT TERM GOALS: = LTGs   LONG TERM GOALS: Target date: 05/19/2022  Pt will be independent with HEP for improved balance, gait. Baseline:  Goal status: GOAL MET, 05/16/2022  2.  Pt will improve TUG/TUG cognitive score to less than or equal to 10% difference for decreased fall risk, improved dual tasking. Baseline: 11.78 sec, 13 sec TUG cog  Goal status: IN PROGRESS  3.  Pt will perform posterior push and release in 2 steps or less for posterior balance recovery. Baseline: 3-4 small steps to regain balance Goal status: IN PROGRESS  4.  Pt will perform eyes closed compliant surface, feet together, mild sway x 30 seconds for improved balance in shower. Baseline: 30 seconds, but mild/moderate sway Goal status: IN PROGRESS   ASSESSMENT:  CLINICAL IMPRESSION: Reviewed updates to HEP given last visit, with pt return demo understanding.  Worked again on interval walking activities with attention to balance, dual tasking (manual and cognitive) today.  Pt has minor LOB occasionally and he has smaller motions with upper body activities, responds well to cues for larger movement patterns.  He is on track to check and likely meet goals next visit, with plan for discharge.    OBJECTIVE IMPAIRMENTS: Abnormal gait, decreased balance, and decreased mobility.   ACTIVITY LIMITATIONS: bed mobility, bathing, hygiene/grooming, and locomotion level  PARTICIPATION LIMITATIONS: community activity  PERSONAL FACTORS: 3+ comorbidities: See PMH above  are also affecting patient's functional outcome.   REHAB POTENTIAL: Good  CLINICAL DECISION MAKING: Stable/uncomplicated  EVALUATION COMPLEXITY: Low  PLAN:  PT FREQUENCY: 2x/week  PT DURATION: 4 weeks including eval  PLANNED INTERVENTIONS: Therapeutic exercises, Therapeutic activity, Neuromuscular  re-education, Balance training, Gait training, Patient/Family education, and Self Care  PLAN FOR NEXT SESSION:  Check LTGs and plan for d/c.  Discuss return screens.    Frazier Butt., PT 05/16/2022, 8:52 AM  [facility] at [facility] [place], [place], [place] [address] Phone # [phone] Fax # [phone]

## 2022-05-18 ENCOUNTER — Ambulatory Visit: Payer: Medicare Other | Admitting: Physical Therapy

## 2022-05-18 ENCOUNTER — Encounter: Payer: Self-pay | Admitting: Physical Therapy

## 2022-05-18 DIAGNOSIS — R2681 Unsteadiness on feet: Secondary | ICD-10-CM | POA: Diagnosis not present

## 2022-05-18 DIAGNOSIS — R29818 Other symptoms and signs involving the nervous system: Secondary | ICD-10-CM

## 2022-05-18 DIAGNOSIS — R2689 Other abnormalities of gait and mobility: Secondary | ICD-10-CM | POA: Diagnosis not present

## 2022-05-18 NOTE — Therapy (Signed)
OUTPATIENT PHYSICAL THERAPY NEURO TREATMENT NOTE/DISCHARGE SUMMARY   Patient Name: Todd Rice MRN: [id number] DOB:Jun 27, 1955, 67 y.o., male Today's Date: 05/18/2022   PCP: Unk Pinto, MD REFERRING PROVIDER: Ward Givens, NP   PHYSICAL THERAPY DISCHARGE SUMMARY  Visits from Start of Care: 8  Current functional level related to goals / functional outcomes: See below-pt has met 3 of 4 LTGs.   Remaining deficits: High level balance   Education / Equipment: Educated in [employer] and optimal PD exercise program post d/c.   Patient agrees to discharge. Patient goals were met. Patient is being discharged due to being pleased with the current functional level.  END OF SESSION:  PT End of Session - 05/18/22 0758     Visit Number 8    Number of Visits 8    Date for PT Re-Evaluation 05/19/22    Authorization Type [employer] Medicare    PT Start Time 0802    PT Stop Time 0831   discharge visit   PT Time Calculation (min) 29 min    Activity Tolerance Patient tolerated treatment well    Behavior During Therapy WFL for tasks assessed/performed             Past Medical History:  Diagnosis Date   Adult BMI 50.0-59.9 kg/sq m    52.77   Anal fissure    Cancer ([place])    kidney cancer   Diabetes mellitus without complication ([place])    borderline   Elevated PSA 08/13/2019   H/O: gout    STABLE   Hepatic steatosis 10/01/2011   History of kidney stones    Hyperlipidemia    Hypertension    Kidney tumor    right upper kidney tumor, monitoring   OSA on CPAP    [employer] [facility]   Parkinson disease 01/04/2016   REM sleep behavior disorder 12/06/2016   Right ureteral stone    Ureteral calculus, right 06/12/2011   Vitamin D deficiency    Weakness    Past Surgical History:  Procedure Laterality Date   ANAL FISSURE REPAIR  10-12-2000   COLONOSCOPY N/A 07/01/2012   Procedure: COLONOSCOPY;  Surgeon: Lafayette Dragon, MD;  [facility];  Service: Endoscopy;  Laterality: N/A;    CYSTOSCOPY WITH RETROGRADE PYELOGRAM, URETEROSCOPY AND STENT PLACEMENT Bilateral 02/26/2013   Procedure: CYSTOSCOPY WITH RETROGRADE PYELOGRAM, URETEROSCOPY AND STENT PLACEMENT;  Surgeon: Alexis Frock, MD;  [facility]: [facility];  Service: Urology;  Laterality: Bilateral;   HOLMIUM LASER APPLICATION Bilateral 29/92/4268   Procedure: HOLMIUM LASER APPLICATION;  Surgeon: Alexis Frock, MD;  Location: [facility];  Service: Urology;  Laterality: Bilateral;   kidney stone removal     06/2011-stent placement   LEFT MEDIAL FEMORAL CONDYLE DEBRIDEMENT & DRILLING/ REMOVAL LOOSE BODY  09-15-2003   NASAL SEPTUM SURGERY  1985   ROBOT ASSISTED LAPAROSCOPIC NEPHRECTOMY Right 12/16/2021   Procedure: XI ROBOTIC ASSISTED LAPAROSCOPIC RADICAL NEPHRECTOMY;  Surgeon: Alexis Frock, MD;  [facility];  Service: Urology;  Laterality: Right;  3 HRS   STONE EXTRACTION WITH BASKET  06/12/2011   Procedure: STONE EXTRACTION WITH BASKET;  Surgeon: Claybon Jabs, MD;  [facility];  Service: Urology;  Laterality: Right;   Patient Active Problem List   Diagnosis Date Noted   Renal mass 12/16/2021   Constipation 03/24/2021   Gout 03/23/2021   Primary renal papillary carcinoma, right ([place]) 02/18/2020   Hepatic steatosis 02/18/2020   Major depression in remission ([place]) 11/11/2019   Central sleep apnea due to  Cheyne-Stokes respiration 08/31/2019   Hyperlipidemia associated with type 2 diabetes mellitus ([place]) 07/09/2019   Aortic atherosclerosis ([place]) by CXR on 06/10/2018 06/10/2018   Type 2 diabetes mellitus with stage 3a chronic kidney disease, without long-term current use of insulin ([place]) 01/21/2018   OSA treated with BiPAP 01/03/2017   REM sleep behavior disorder 12/06/2016   CKD stage 3 due to type 2 diabetes mellitus ([place]) 10/23/2016   Medication management 10/23/2016   Vitamin D deficiency 10/23/2016   Parkinson's disease 01/04/2016   Seasonal and perennial allergic rhinitis 06/21/2014    Asthma, mild intermittent, well-controlled 06/21/2014   Pseudomonas aeruginosa colonization 10/08/2012   CAD (coronary artery disease) 11/15/2011   Essential hypertension 11/15/2011   Morbid obesity with BMI of 40.0-44.9, adult ([place]) 11/15/2011    ONSET DATE: 04/20/2022 (PD screen)  REFERRING DIAG: G20.A1 (ICD-10-CM) - Parkinson's disease without dyskinesia or fluctuating manifestations   THERAPY DIAG:  Unsteadiness on feet  Other abnormalities of gait and mobility  Other symptoms and signs involving the nervous system  Rationale for Evaluation and Treatment: Rehabilitation  SUBJECTIVE:                                                                                                                                                                                             SUBJECTIVE STATEMENT: Found out about a death in my family yesterday, so not doing the best today.  Did use the turning strategy and that has helped. Pt accompanied by: self  PERTINENT HISTORY: See above  PAIN:  Are you having pain? No  PRECAUTIONS: Fall  WEIGHT BEARING RESTRICTIONS: No  FALLS: Has patient fallen in last 6 months? No  LIVING ENVIRONMENT: Lives with: lives with their spouse Lives in: House/apartment Stairs: Yes: Internal: 1 steps; none and External: 12 steps; doesn't have to go upstairs Has following equipment at home: None  PLOF: Independent, mows the grass and walks the dog, does exercises from OT.  PATIENT GOALS: Pt's goals for therapy are to improve movement and motivation for exercise following the surgery.  OBJECTIVE:    TODAY'S TREATMENT: 05/18/2022 Activity Comments  Initial walking in gym for warm-up, 5 x 85 ft, then 6 x 50 ft   5x sit<>stand:  10.69 sec   TUG:  11.21 sec TUG cognitive:  14.10 sec   61M: 8.81 sec = 3.72 ft/sec   On Aire[employer]: feet wide BOS, Romberg stance with EO/EC 30 sec each position Mild sway  Posterior push and release:  1 step and regains balance, 2  trials       PATIENT EDUCATION: Education details: Progress towards goals, POC.  Discussed  ways to keep up with HEP and optimal exercise program. Person educated: Patient Education method: Explanation Education comprehension: verbalized understanding    Access Code: [email or web address] URL: [email or web address] Date: 05/11/2022 Prepared by: [facility]  Program Notes Standing on your cushion:-marching in place x 10-alternating forward step taps x 10-alternating side step taps x 10-alternating back step taps x 10*Do this standing by the counter for support as needed  Exercises - Romberg Stance Eyes Closed on Foam Pad  - 1 x daily - 5 x weekly - 1 sets - 3 reps - 30 sec hold - Romberg Stance with Head Nods on Foam Pad  - 1 x daily - 5 x weekly - 1 sets - 10 reps - Romberg Stance on Foam Pad with Head Rotation  - 1 x daily - 5 x weekly - 1 sets - 10 reps - Forward and Backward Monster Walk with Resistance at Ankles and Counter Support  - 1 x daily - 5 x weekly - 1 sets - 3-5 reps - Gastroc Stretch on Wall  - 1-2 x daily - 7 x weekly - 1 sets - 3 reps - 30 sec hold - Standing Bilateral Gastroc Stretch with Step  - 1-2 x daily - 7 x weekly - 1 sets - 30 reps - 30 sec hold - Standing Lumbar Spine Flexion Stretch Counter  - 1-2 x daily - 7 x weekly - 1 sets - 5 reps - 15 sec hold    ------------------------------------------------------------------------------------------------ Objective measures below taken at initial evaluation: DIAGNOSTIC FINDINGS: NA  COGNITION: Overall cognitive status: Within functional limits for tasks assessed  Pt reports having to think about things more.   SENSATION: Light touch: WFL   POSTURE: rounded shoulders and forward head  LOWER EXTREMITY ROM:   WFLs  Active  Right Eval Left Eval  Hip flexion    Hip extension    Hip abduction    Hip adduction    Hip internal rotation    Hip external rotation     Knee flexion    Knee extension    Ankle dorsiflexion    Ankle plantarflexion    Ankle inversion    Ankle eversion     (Blank rows = not tested)  LOWER EXTREMITY MMT:  Grossly tested at least 4+/5 BLEs  MMT Right Eval Left Eval  Hip flexion    Hip extension    Hip abduction    Hip adduction    Hip internal rotation    Hip external rotation    Knee flexion    Knee extension    Ankle dorsiflexion    Ankle plantarflexion    Ankle inversion    Ankle eversion    (Blank rows = not tested)   TRANSFERS: Assistive device utilized: None  Sit to stand: Modified independence Stand to sit: Modified independence  GAIT: Gait pattern: step through pattern, decreased arm swing- Right, and decreased arm swing- Left Distance walked: 50 ft x 4 Assistive device utilized: None Level of assistance: Modified independence  FUNCTIONAL TESTS:  5 times sit to stand: 10.16 sec Timed up and go (TUG): 11.78 sec 10 meter walk test: 10.56 sec (3.11 ft/sec) TUG cognitive:  13 sec TUG manual:12.03 sec MiniBEST: 25/28 FGA:  26/30        M-CTSIB  Condition 1: Firm Surface, EO 30 Sec, Normal Sway  Condition 2: Firm Surface, EC 30 Sec, Mild Sway  Condition 3: Foam Surface, EO 30 Sec, Mild Sway  Condition 4: Foam Surface, EC 30 Sec, Mild Sway     TODAY'S TREATMENT:                                                                                                                              DATE: 04/26/2022    PATIENT EDUCATION: Education details: PT eval results, POC; initiated HEP Person educated: Patient Education method: Explanation, Demonstration, and Handouts Education comprehension: verbalized understanding and returned demonstration  HOME EXERCISE PROGRAM: Access Code: [email or web address] URL: [email or web address] Date: 04/26/2022 Prepared by: [facility]  Exercises - Romberg Stance Eyes Closed on Foam Pad  - 1 x daily - 5 x weekly -  1 sets - 3 reps - 30 sec hold - Romberg Stance with Head Nods on Foam Pad  - 1 x daily - 5 x weekly - 1 sets - 10 reps - Romberg Stance on Foam Pad with Head Rotation  - 1 x daily - 5 x weekly - 1 sets - 10 reps  GOALS: Goals reviewed with patient? Yes  SHORT TERM GOALS: = LTGs   LONG TERM GOALS: Target date: 05/19/2022  Pt will be independent with HEP for improved balance, gait. Baseline:  Goal status: GOAL MET, 05/16/2022  2.  Pt will improve TUG/TUG cognitive score to less than or equal to 10% difference for decreased fall risk, improved dual tasking. Baseline: 11.78 sec, 13 sec TUG cog; TUG:  11.21 sec, TUG cognitive:  14.10 sec 05/18/2022 Goal status: GOAL NOT MET  3.  Pt will perform posterior push and release in 2 steps or less for posterior balance recovery. Baseline: 3-4 small steps to regain balance; 1 step in posterior direction 05/18/2022 Goal status: GOAL MET, 05/18/2022  4.  Pt will perform eyes closed compliant surface, feet together, mild sway x 30 seconds for improved balance in shower. Baseline: 30 seconds, but mild/moderate sway Goal status: GOAL MET, 05/18/2022   ASSESSMENT:  CLINICAL IMPRESSION: Assessed LTGs this visit, with pt meeting 3 of 4 LTGs.  Pt has met LTG 1 for HEP, LTG 3 for posterior balance recovery, LTG 4 for improved balance on compliant surface.  LTG 2 not met met, with pt having >10% difference on TUG/TUG cognitive.   Discussed ways he can practice dual tasking with focus on cognitive aspect doing his exercises at home.  Pt is performing exercises well, and PT/pt discussed ways to sustain HEP and overall exercise performance more long-term.  He is appropriate for d/c at this time.  OBJECTIVE IMPAIRMENTS: Abnormal gait, decreased balance, and decreased mobility.   ACTIVITY LIMITATIONS: bed mobility, bathing, hygiene/grooming, and locomotion level  PARTICIPATION LIMITATIONS: community activity  PERSONAL FACTORS: 3+ comorbidities: See PMH above  are  also affecting patient's functional outcome.   REHAB POTENTIAL: Good  CLINICAL DECISION MAKING: Stable/uncomplicated  EVALUATION COMPLEXITY: Low  PLAN:  PT FREQUENCY: 2x/week  PT DURATION: 4 weeks including eval  PLANNED INTERVENTIONS: Therapeutic exercises,  Therapeutic activity, Neuromuscular re-education, Balance training, Gait training, Patient/Family education, and Self Care  PLAN FOR NEXT SESSION:  Discharge this visit, plan for return screens in 6-9 months.  Frazier Butt., PT 05/18/2022, 8:43 AM  [facility] at [facility] [place], [place], [place] [address] Phone # [phone] Fax # [phone]

## 2022-05-22 ENCOUNTER — Other Ambulatory Visit (HOSPITAL_BASED_OUTPATIENT_CLINIC_OR_DEPARTMENT_OTHER): Payer: Self-pay

## 2022-05-22 ENCOUNTER — Other Ambulatory Visit: Payer: Self-pay | Admitting: Internal Medicine

## 2022-05-22 ENCOUNTER — Other Ambulatory Visit: Payer: Self-pay

## 2022-05-22 DIAGNOSIS — I10 Essential (primary) hypertension: Secondary | ICD-10-CM

## 2022-05-22 MED ORDER — VALSARTAN-HYDROCHLOROTHIAZIDE 320-25 MG PO TABS
1.0000 | ORAL_TABLET | Freq: Every day | ORAL | 3 refills | Status: DC
  Filled 2022-05-22: qty 90, 90d supply, fill #0
  Filled 2022-08-16: qty 90, 90d supply, fill #1
  Filled 2022-11-14: qty 90, 90d supply, fill #2
  Filled 2023-02-13: qty 90, 90d supply, fill #3

## 2022-05-23 ENCOUNTER — Other Ambulatory Visit (HOSPITAL_BASED_OUTPATIENT_CLINIC_OR_DEPARTMENT_OTHER): Payer: Self-pay

## 2022-05-23 ENCOUNTER — Other Ambulatory Visit: Payer: Self-pay

## 2022-05-30 ENCOUNTER — Telehealth: Payer: Self-pay | Admitting: Occupational Therapy

## 2022-05-30 DIAGNOSIS — G20A1 Parkinson's disease without dyskinesia, without mention of fluctuations: Secondary | ICD-10-CM

## 2022-05-30 NOTE — Telephone Encounter (Signed)
Todd Mcmurray, NP,  Todd Rice is scheduled for  OT re-evaluations on 06/20/22 as recommended when he was last discharged from therapy due to progressive nature of diagnosis.  Pt was in agreement with this plan.  If you are in agreement, please send updated order for OT via epic.  Thank you,  Vianne Bulls, OTR/L [facility] [address] [place], [place]  [address] [phone] phone [phone] 05/30/22 9:58 AM

## 2022-05-30 NOTE — Telephone Encounter (Signed)
Order placed

## 2022-06-05 ENCOUNTER — Other Ambulatory Visit: Payer: Self-pay

## 2022-06-05 ENCOUNTER — Other Ambulatory Visit (HOSPITAL_BASED_OUTPATIENT_CLINIC_OR_DEPARTMENT_OTHER): Payer: Self-pay

## 2022-06-07 NOTE — Therapy (Signed)
OUTPATIENT OCCUPATIONAL THERAPY NEURO EVALUATION  Patient Name: Todd Rice MRN: [id number] DOB:29-Jun-1955, 67 y.o., male Today's Date: 06/07/2022  PCP: Dr. Unk Pinto REFERRING PROVIDER: Ernestine Mcmurray, NP  END OF SESSION:   Past Medical History:  Diagnosis Date   Adult BMI 50.0-59.9 kg/sq m    52.77   Anal fissure    Cancer ([place])    kidney cancer   Diabetes mellitus without complication ([place])    borderline   Elevated PSA 08/13/2019   H/O: gout    STABLE   Hepatic steatosis 10/01/2011   History of kidney stones    Hyperlipidemia    Hypertension    Kidney tumor    right upper kidney tumor, monitoring   OSA on CPAP    [employer] [facility]   Parkinson disease 01/04/2016   REM sleep behavior disorder 12/06/2016   Right ureteral stone    Ureteral calculus, right 06/12/2011   Vitamin D deficiency    Weakness    Past Surgical History:  Procedure Laterality Date   ANAL FISSURE REPAIR  10-12-2000   COLONOSCOPY N/A 07/01/2012   Procedure: COLONOSCOPY;  Surgeon: Lafayette Dragon, MD;  [facility];  Service: Endoscopy;  Laterality: N/A;   CYSTOSCOPY WITH RETROGRADE PYELOGRAM, URETEROSCOPY AND STENT PLACEMENT Bilateral 02/26/2013   Procedure: CYSTOSCOPY WITH RETROGRADE PYELOGRAM, URETEROSCOPY AND STENT PLACEMENT;  Surgeon: Alexis Frock, MD;  [facility];  Service: Urology;  Laterality: Bilateral;   HOLMIUM LASER APPLICATION Bilateral 01/60/1093   Procedure: HOLMIUM LASER APPLICATION;  Surgeon: Alexis Frock, MD;  [facility];  Service: Urology;  Laterality: Bilateral;   kidney stone removal     06/2011-stent placement   LEFT MEDIAL FEMORAL CONDYLE DEBRIDEMENT & DRILLING/ REMOVAL LOOSE BODY  09-15-2003   NASAL SEPTUM SURGERY  1985   ROBOT ASSISTED LAPAROSCOPIC NEPHRECTOMY Right 12/16/2021   Procedure: XI ROBOTIC ASSISTED LAPAROSCOPIC RADICAL NEPHRECTOMY;  Surgeon: Alexis Frock, MD;  [facility];  Service: Urology;  Laterality: Right;  3 HRS   STONE  EXTRACTION WITH BASKET  06/12/2011   Procedure: STONE EXTRACTION WITH BASKET;  Surgeon: Claybon Jabs, MD;  Location: [facility];  Service: Urology;  Laterality: Right;   Patient Active Problem List   Diagnosis Date Noted   Renal mass 12/16/2021   Constipation 03/24/2021   Gout 03/23/2021   Primary renal papillary carcinoma, right ([place]) 02/18/2020   Hepatic steatosis 02/18/2020   Major depression in remission ([place]) 11/11/2019   Central sleep apnea due to Cheyne-Stokes respiration 08/31/2019   Hyperlipidemia associated with type 2 diabetes mellitus ([place]) 07/09/2019   Aortic atherosclerosis ([place]) by CXR on 06/10/2018 06/10/2018   Type 2 diabetes mellitus with stage 3a chronic kidney disease, without long-term current use of insulin ([place]) 01/21/2018   OSA treated with BiPAP 01/03/2017   REM sleep behavior disorder 12/06/2016   CKD stage 3 due to type 2 diabetes mellitus ([place]) 10/23/2016   Medication management 10/23/2016   Vitamin D deficiency 10/23/2016   Parkinson's disease 01/04/2016   Seasonal and perennial allergic rhinitis 06/21/2014   Asthma, mild intermittent, well-controlled 06/21/2014   Pseudomonas aeruginosa colonization 10/08/2012   CAD (coronary artery disease) 11/15/2011   Essential hypertension 11/15/2011   Morbid obesity with BMI of 40.0-44.9, adult ([place]) 11/15/2011    ONSET DATE: 05/30/22 (referral date)  REFERRING DIAG: Parkinson's Disease  THERAPY DIAG:  No diagnosis found.  Rationale for Evaluation and Treatment: Rehabilitation  SUBJECTIVE:   SUBJECTIVE STATEMENT: *** Pt accompanied by: {accompnied:27141}  PERTINENT HISTORY:  Pt returns today for 6-13monthre-evaluation as recommended when last discharged from occupational therapy 11/17/21.  ***  PMH includes:  CAD, HTN, HDL, obesity, asthma, DM, CKD, vitamin D deficiency, REM sleep disorder, OSA, aortic atherosclerosis, hx of gout, GERD, hx of anxiety, tumor on R kidney s/p  nephrectomy  PRECAUTIONS: {Therapy precautions:24002}  WEIGHT BEARING RESTRICTIONS: No  PAIN:  Are you having pain? {OPRCPAIN:27236}  FALLS: Has patient fallen in last 6 months? {fallsyesno:27318}  LIVING ENVIRONMENT: Lives with: {OPRC lives with:25569::"lives with their family"} Lives in: {Lives in:25570} Stairs: {opstairs:27293} Has following equipment at home: {Assistive devices:23999}  PLOF: {PLOF:24004}  PATIENT GOALS: ***  OBJECTIVE:   HAND DOMINANCE: {MISC; OT HAND DOMINANCE:[id number]}  ADLs: Overall ADLs: *** Transfers/ambulation related to ADLs: Eating: *** Grooming: *** UB Dressing: *** LB Dressing: *** Toileting: *** Bathing: *** Tub Shower transfers: *** Equipment: {equipment:25573}  IADLs: Shopping: *** Light housekeeping: *** Meal Prep: *** Community mobility: *** Medication management: *** Financial management: *** Handwriting: {OTWRITTENEXPRESSION:25361}  MOBILITY STATUS: {OTMOBILITY:25360}  POSTURE COMMENTS:  {posture:25561} Sitting balance: {sitting balance:25483}  ACTIVITY TOLERANCE: Activity tolerance: ***  FUNCTIONAL OUTCOME MEASURES: {OTFUNCTIONALMEASURES:27238}  UPPER EXTREMITY ROM:    {AROM/PROM:27142} ROM Right eval Left eval  Shoulder flexion    Shoulder abduction    Shoulder adduction    Shoulder extension    Shoulder internal rotation    Shoulder external rotation    Elbow flexion    Elbow extension    Wrist flexion    Wrist extension    Wrist ulnar deviation    Wrist radial deviation    Wrist pronation    Wrist supination    (Blank rows = not tested)  UPPER EXTREMITY MMT:     MMT Right eval Left eval  Shoulder flexion    Shoulder abduction    Shoulder adduction    Shoulder extension    Shoulder internal rotation    Shoulder external rotation    Middle trapezius    Lower trapezius    Elbow flexion    Elbow extension    Wrist flexion    Wrist extension    Wrist ulnar deviation    Wrist radial  deviation    Wrist pronation    Wrist supination    (Blank rows = not tested)  HAND FUNCTION: {handfunction:27230}  COORDINATION: {otcoordination:27237}  SENSATION: {sensation:27233}  EDEMA: ***  MUSCLE TONE: {UETONE:25567}  COGNITION: Overall cognitive status: {cognition:24006}  VISION: Subjective report: *** Baseline vision: {OTBASELINEVISION:25363} Visual history: {OTVISUALHISTORY:25364}  VISION ASSESSMENT: {visionassessment:27231}  Patient has difficulty with following activities due to following visual impairments: ***  PERCEPTION: {Perception:25564}  PRAXIS: {Praxis:25565}  OBSERVATIONS: ***   TODAY'S TREATMENT:                                                                                                                              DATE: ***   PATIENT EDUCATION: Education details: *** Person educated: {Person educated:25204} Education method: {Education Method:25205} Education comprehension: {Education Comprehension:25206}  HOME EXERCISE PROGRAM: ***   GOALS: Goals reviewed with patient? {yes/no:20286}  SHORT TERM GOALS: Target date: ***  *** Baseline: Goal status: {GOALSTATUS:25110}  2.  *** Baseline:  Goal status: {GOALSTATUS:25110}  3.  *** Baseline:  Goal status: {GOALSTATUS:25110}  4.  *** Baseline:  Goal status: {GOALSTATUS:25110}  5.  *** Baseline:  Goal status: {GOALSTATUS:25110}  6.  *** Baseline:  Goal status: {GOALSTATUS:25110}  LONG TERM GOALS: Target date: ***  *** Baseline:  Goal status: {GOALSTATUS:25110}  2.  *** Baseline:  Goal status: {GOALSTATUS:25110}  3.  *** Baseline:  Goal status: {GOALSTATUS:25110}  4.  *** Baseline:  Goal status: {GOALSTATUS:25110}  5.  *** Baseline:  Goal status: {GOALSTATUS:25110}  6.  *** Baseline:  Goal status: {GOALSTATUS:25110}  ASSESSMENT:  CLINICAL IMPRESSION: Patient is a *** y.o. *** who was seen today for occupational therapy evaluation for ***.    PERFORMANCE DEFICITS: in functional skills including {OT physical skills:25468}, cognitive skills including {OT cognitive skills:25469}, and psychosocial skills including {OT psychosocial skills:25470}.   IMPAIRMENTS: are limiting patient from {OT performance deficits:25471}.   CO-MORBIDITIES: {Comorbidities:25485} that affects occupational performance. Patient will benefit from skilled OT to address above impairments and improve overall function.  MODIFICATION OR ASSISTANCE TO COMPLETE EVALUATION: {OT modification:25474}  OT OCCUPATIONAL PROFILE AND HISTORY: {OT PROFILE AND HISTORY:25484}  CLINICAL DECISION MAKING: {OT CDM:25475}  REHAB POTENTIAL: {rehabpotential:25112}  EVALUATION COMPLEXITY: {Evaluation complexity:25115}    PLAN:  OT FREQUENCY: {rehab frequency:25116}  OT DURATION: {rehab duration:25117}  PLANNED INTERVENTIONS: {OT Interventions:25467}  RECOMMENDED OTHER SERVICES: ***  CONSULTED AND AGREED WITH PLAN OF CARE: {SEL:95320}  PLAN FOR NEXT SESSION: Vianne Bulls, OTR/L 06/07/2022, 5:09 PM

## 2022-06-08 ENCOUNTER — Ambulatory Visit: Payer: Medicare Other | Attending: Adult Health | Admitting: Occupational Therapy

## 2022-06-08 DIAGNOSIS — R293 Abnormal posture: Secondary | ICD-10-CM | POA: Diagnosis not present

## 2022-06-08 DIAGNOSIS — R29818 Other symptoms and signs involving the nervous system: Secondary | ICD-10-CM | POA: Diagnosis not present

## 2022-06-08 DIAGNOSIS — R278 Other lack of coordination: Secondary | ICD-10-CM | POA: Diagnosis not present

## 2022-06-08 DIAGNOSIS — G20A1 Parkinson's disease without dyskinesia, without mention of fluctuations: Secondary | ICD-10-CM | POA: Insufficient documentation

## 2022-06-08 DIAGNOSIS — R2681 Unsteadiness on feet: Secondary | ICD-10-CM | POA: Insufficient documentation

## 2022-06-08 DIAGNOSIS — R251 Tremor, unspecified: Secondary | ICD-10-CM | POA: Insufficient documentation

## 2022-06-08 DIAGNOSIS — R29898 Other symptoms and signs involving the musculoskeletal system: Secondary | ICD-10-CM | POA: Diagnosis not present

## 2022-06-11 NOTE — Therapy (Signed)
OUTPATIENT OCCUPATIONAL THERAPY [facility]  Patient Name: Todd Rice MRN: [id number] DOB:1955/11/02, 67 y.o., male Today's Date: 06/12/2022  PCP: Dr. Unk Pinto REFERRING PROVIDER: Ernestine Mcmurray, NP  END OF SESSION:  OT End of Session - 06/12/22 3976     Visit Number 2    Number of Visits 9    Date for OT Re-Evaluation 07/08/22    Authorization Type UHC Medicare, follows Medicare guidelines    Authorization - Visit Number 2    Authorization - Number of Visits 10    Progress Note Due on Visit 10    OT Start Time 0805    OT Stop Time 0845    OT Time Calculation (min) 40 min    Activity Tolerance Patient tolerated treatment well    Behavior During Therapy [facility] for tasks assessed/performed              Past Medical History:  Diagnosis Date   Adult BMI 50.0-59.9 kg/sq m    52.77   Anal fissure    Cancer ([place])    kidney cancer   Diabetes mellitus without complication ([place])    borderline   Elevated PSA 08/13/2019   H/O: gout    STABLE   Hepatic steatosis 10/01/2011   History of kidney stones    Hyperlipidemia    Hypertension    Kidney tumor    right upper kidney tumor, monitoring   OSA on CPAP    [employer] [facility]   Parkinson disease 01/04/2016   REM sleep behavior disorder 12/06/2016   Right ureteral stone    Ureteral calculus, right 06/12/2011   Vitamin D deficiency    Weakness    Past Surgical History:  Procedure Laterality Date   ANAL FISSURE REPAIR  10-12-2000   COLONOSCOPY N/A 07/01/2012   Procedure: COLONOSCOPY;  Surgeon: Lafayette Dragon, MD;  [facility];  Service: Endoscopy;  Laterality: N/A;   CYSTOSCOPY WITH RETROGRADE PYELOGRAM, URETEROSCOPY AND STENT PLACEMENT Bilateral 02/26/2013   Procedure: CYSTOSCOPY WITH RETROGRADE PYELOGRAM, URETEROSCOPY AND STENT PLACEMENT;  Surgeon: Alexis Frock, MD;  Location: [facility];  Service: Urology;  Laterality: Bilateral;   HOLMIUM LASER APPLICATION Bilateral 73/41/9379   Procedure:  HOLMIUM LASER APPLICATION;  Surgeon: Alexis Frock, MD;  Location: [facility];  Service: Urology;  Laterality: Bilateral;   kidney stone removal     06/2011-stent placement   LEFT MEDIAL FEMORAL CONDYLE DEBRIDEMENT & DRILLING/ REMOVAL LOOSE BODY  09-15-2003   NASAL SEPTUM SURGERY  1985   ROBOT ASSISTED LAPAROSCOPIC NEPHRECTOMY Right 12/16/2021   Procedure: XI ROBOTIC ASSISTED LAPAROSCOPIC RADICAL NEPHRECTOMY;  Surgeon: Alexis Frock, MD;  [facility];  Service: Urology;  Laterality: Right;  3 HRS   STONE EXTRACTION WITH BASKET  06/12/2011   Procedure: STONE EXTRACTION WITH BASKET;  Surgeon: Claybon Jabs, MD;  Location: [facility];  Service: Urology;  Laterality: Right;   Patient Active Problem List   Diagnosis Date Noted   Renal mass 12/16/2021   Constipation 03/24/2021   Gout 03/23/2021   Primary renal papillary carcinoma, right ([place]) 02/18/2020   Hepatic steatosis 02/18/2020   Major depression in remission ([place]) 11/11/2019   Central sleep apnea due to Cheyne-Stokes respiration 08/31/2019   Hyperlipidemia associated with type 2 diabetes mellitus ([place]) 07/09/2019   Aortic atherosclerosis ([place]) by CXR on 06/10/2018 06/10/2018   Type 2 diabetes mellitus with stage 3a chronic kidney disease, without long-term current use of insulin ([place]) 01/21/2018   OSA treated with BiPAP 01/03/2017  REM sleep behavior disorder 12/06/2016   CKD stage 3 due to type 2 diabetes mellitus ([place]) 10/23/2016   Medication management 10/23/2016   Vitamin D deficiency 10/23/2016   Parkinson's disease 01/04/2016   Seasonal and perennial allergic rhinitis 06/21/2014   Asthma, mild intermittent, well-controlled 06/21/2014   Pseudomonas aeruginosa colonization 10/08/2012   CAD (coronary artery disease) 11/15/2011   Essential hypertension 11/15/2011   Morbid obesity with BMI of 40.0-44.9, adult ([place]) 11/15/2011    ONSET DATE: 05/30/22 (referral date)  REFERRING DIAG: Parkinson's  Disease  THERAPY DIAG:  Other symptoms and signs involving the nervous system  Other symptoms and signs involving the musculoskeletal system  Other lack of coordination  Unsteadiness on feet  Abnormal posture  Tremor  Rationale for Evaluation and Treatment: Rehabilitation  SUBJECTIVE:   SUBJECTIVE STATEMENT: Doing good     Pt accompanied by: self  PERTINENT HISTORY: Pt returns today for 6-56monthre-evaluation as recommended when last discharged from occupational therapy 11/17/21.  Pt reports that he is having more "Parkinson's Days."  PMH includes:  CAD, HTN, HDL, obesity, asthma, DM, CKD, vitamin D deficiency, REM sleep disorder, OSA, aortic atherosclerosis, hx of gout, GERD, hx of anxiety, tumor on R kidney s/p nephrectomy  PRECAUTIONS: Fall  WEIGHT BEARING RESTRICTIONS: No  PAIN:  Are you having pain? No  FALLS: Has patient fallen in last 6 months? No  LIVING ENVIRONMENT: Lives with: lives with their family and lives with their spouse Lives in: House/apartment  PLOF: Independent, Vocation/Vocational requirements: retired, and Leisure: walking, HEP  PATIENT GOALS: improve L hand, ADLs  OBJECTIVE:   HAND DOMINANCE: Right  ADLs:  Transfers/ambulation related to ADLs:  doesn't get on floor, min difficulty with bed mobility Eating: rarely switches hands, occasional assist with cutting meat, difficulty opening unsealed bottle (use both hands), opening packages Grooming: mod I UB Dressing: mod I, doesn't wear buttons LB Dressing: mod I Toileting: mod I Bathing: mod I Tub Shower transfers: mod I   IADLs: Shopping: mod I Light housekeeping: wife does most, mod I for tasks performed  Meal Prep: wife does most, mod I for tasks performed Community mobility: backed into carport in rainstorm, hugging L side more  Medication management: independent Financial management: wife has always performed  Handwriting: 100% legible and Mild micrographia  MOBILITY  STATUS: Independent  Just finished PT recently.  POSTURE COMMENTS:  rounded shoulders  ACTIVITY TOLERANCE: Activity tolerance: denies difficulty  FUNCTIONAL OUTCOME MEASURES: Standing functional reach: Right: 12 inches; Left: 10 inches Fastening/unfastening 3 buttons: 62.60 Physical performance test: PPT#2 (simulated eating) not tested & PPT#4 (donning/doffing jacket): 18.59sec  COORDINATION: 9 Hole Peg test: Right: 49.91, 34.12  sec; Left: 66.47, 39.19sec with trunk compensation noted with each hand (decr in-hand manipulation) Box and Blocks:  Right 42 blocks, Left 42blocks Tremors: Resting, Right, and Left  UE ROM:   Bilateral shoulder flex R-145* with elbow ext WNL, L-150* with elbow ext WNL , mild decr supination bilaterally R>L and mild decr R MP ext  Otherwise bilateral UE ROM grossly WNL  UE MMT:   Not tested  SENSATION: Not tested  MUSCLE TONE: RUE: Moderate and Rigidity and LUE: Mild and Rigidity  COGNITION: Overall cognitive status:  bradyphrenia with word recall  OBSERVATIONS: Bradykinesia   TODAY'S TREATMENT:  Reviewed PWR! Hands (basic 4) and pt returned demo each x10 with min v.c. for large amplitude.  Sliding cards across table with each hand with PWR! Hands with min cueing for large amplitude movements.  Coordination Activities (with focus/min v.c. for large amplitude movements) with each hand:  Flipping cards with large amplitude movements, dealing cards with thumb.  With palm on table, finger flex/ext to slide card in/out.  Review of prior HEP--With both hands, pt returned demo each:  palm on table, individual finger lifts with abduction/adduction, then isolated/individual finger lifts for incr MP ext. With min v.c. for incr movement amplitude.  Isolated IP flexion/ext for decr rigidity of hands with min v.c.       PATIENT  EDUCATION: Education details: see above  Person educated: Patient Education method: Explanation, Demonstration, and Verbal cues Education comprehension: verbalized understanding and returned demonstration  HOME EXERCISE PROGRAM: Not yet issued  GOALS: Goals reviewed with patient? Yes  SHORT TERM GOALS: Target date: n/a--see LTGs   LONG TERM GOALS: Target date: 07/08/22  Pt will be independent with updated PD-specific HEP. Goal status: INITIAL  2.  Pt will verbalize understanding of updated strategies for ADLs/IADLs to incr ease, incr safety, and decr risk of future complications (including strategies for eating, opening bottles, etc).   Goal status: INITIAL  3.  Pt will be improve bilateral coordination as shown by fastening/unfastening 3 buttons in less than 48sec. Baseline: 62.60sec Goal status: INITIAL  4.  Pt will perform bilateral functional reaching/coordination as shown by improving score on box and blocks test by at least 3 bilaterally. Baseline: R-42, L-42 blocks Goal status: INITIAL    ASSESSMENT:  CLINICAL IMPRESSION: Pt is progressing towards goals with understanding of HEP review.  Pt returned demo with min v.c. for incr movement amplitude.   PERFORMANCE DEFICITS: in functional skills including ADLs, IADLs, coordination, dexterity, tone, ROM, strength, flexibility, Fine motor control, Gross motor control, mobility, balance, and UE functional use, cognitive skills including  bradyphrenia , and psychosocial skills including habits.   IMPAIRMENTS: are limiting patient from ADLs, IADLs, and leisure.   COMORBIDITIES:  may have co-morbidities  that affects occupational performance. Patient will benefit from skilled OT to address above impairments and improve overall function.  MODIFICATION OR ASSISTANCE TO COMPLETE EVALUATION: Min-Moderate modification of tasks or assist with assess necessary to complete an evaluation.  OT OCCUPATIONAL PROFILE AND HISTORY: Detailed  assessment: Review of records and additional review of physical, cognitive, psychosocial history related to current functional performance.  CLINICAL DECISION MAKING: Moderate - several treatment options, min-mod task modification necessary  REHAB POTENTIAL: Good  EVALUATION COMPLEXITY: Moderate    PLAN:  OT FREQUENCY: 2x/week  OT DURATION: 4 weeks +eval  PLANNED INTERVENTIONS: self care/ADL training, therapeutic exercise, therapeutic activity, neuromuscular re-education, manual therapy, passive range of motion, balance training, functional mobility training, splinting, ultrasound, paraffin, fluidotherapy, moist heat, cryotherapy, patient/family education, cognitive remediation/compensation, energy conservation, and DME and/or AE instructions  RECOMMENDED OTHER SERVICES: none at this time  CONSULTED AND AGREED WITH PLAN OF CARE: Patient  PLAN FOR NEXT SESSION: continue to review/update coordination HEP, review ADL strategies (buttons, eating, opening containers)   Alice Burnside, OTR/L 06/12/2022, 9:13 AM

## 2022-06-12 ENCOUNTER — Encounter: Payer: Self-pay | Admitting: Occupational Therapy

## 2022-06-12 ENCOUNTER — Ambulatory Visit: Payer: Medicare Other | Admitting: Occupational Therapy

## 2022-06-12 DIAGNOSIS — R293 Abnormal posture: Secondary | ICD-10-CM | POA: Diagnosis not present

## 2022-06-12 DIAGNOSIS — R29898 Other symptoms and signs involving the musculoskeletal system: Secondary | ICD-10-CM

## 2022-06-12 DIAGNOSIS — R2681 Unsteadiness on feet: Secondary | ICD-10-CM | POA: Diagnosis not present

## 2022-06-12 DIAGNOSIS — R251 Tremor, unspecified: Secondary | ICD-10-CM

## 2022-06-12 DIAGNOSIS — R278 Other lack of coordination: Secondary | ICD-10-CM | POA: Diagnosis not present

## 2022-06-12 DIAGNOSIS — G20A1 Parkinson's disease without dyskinesia, without mention of fluctuations: Secondary | ICD-10-CM | POA: Diagnosis not present

## 2022-06-12 DIAGNOSIS — R29818 Other symptoms and signs involving the nervous system: Secondary | ICD-10-CM | POA: Diagnosis not present

## 2022-06-14 NOTE — Therapy (Signed)
OUTPATIENT OCCUPATIONAL THERAPY [facility]  Patient Name: Todd Rice MRN: [id number] DOB:12-14-1955, 67 y.o., male Today's Date: 06/15/2022  PCP: Dr. Unk Pinto REFERRING PROVIDER: Ernestine Mcmurray, NP  END OF SESSION:  OT End of Session - 06/15/22 0810     Visit Number 3    Number of Visits 9    Date for OT Re-Evaluation 07/08/22    Authorization Type UHC Medicare, follows Medicare guidelines    Authorization - Visit Number 3    Authorization - Number of Visits 10    Progress Note Due on Visit 10    OT Start Time 0804    OT Stop Time 0845    OT Time Calculation (min) 41 min    Activity Tolerance Patient tolerated treatment well    Behavior During Therapy [facility] for tasks assessed/performed               Past Medical History:  Diagnosis Date   Adult BMI 50.0-59.9 kg/sq m    52.77   Anal fissure    Cancer ([place])    kidney cancer   Diabetes mellitus without complication ([place])    borderline   Elevated PSA 08/13/2019   H/O: gout    STABLE   Hepatic steatosis 10/01/2011   History of kidney stones    Hyperlipidemia    Hypertension    Kidney tumor    right upper kidney tumor, monitoring   OSA on CPAP    [employer] [facility]   Parkinson disease 01/04/2016   REM sleep behavior disorder 12/06/2016   Right ureteral stone    Ureteral calculus, right 06/12/2011   Vitamin D deficiency    Weakness    Past Surgical History:  Procedure Laterality Date   ANAL FISSURE REPAIR  10-12-2000   COLONOSCOPY N/A 07/01/2012   Procedure: COLONOSCOPY;  Surgeon: Lafayette Dragon, MD;  [facility];  Service: Endoscopy;  Laterality: N/A;   CYSTOSCOPY WITH RETROGRADE PYELOGRAM, URETEROSCOPY AND STENT PLACEMENT Bilateral 02/26/2013   Procedure: CYSTOSCOPY WITH RETROGRADE PYELOGRAM, URETEROSCOPY AND STENT PLACEMENT;  Surgeon: Alexis Frock, MD;  [facility];  Service: Urology;  Laterality: Bilateral;   HOLMIUM LASER APPLICATION Bilateral 16/60/6301   Procedure:  HOLMIUM LASER APPLICATION;  Surgeon: Alexis Frock, MD;  [facility];  Service: Urology;  Laterality: Bilateral;   kidney stone removal     06/2011-stent placement   LEFT MEDIAL FEMORAL CONDYLE DEBRIDEMENT & DRILLING/ REMOVAL LOOSE BODY  09-15-2003   NASAL SEPTUM SURGERY  1985   ROBOT ASSISTED LAPAROSCOPIC NEPHRECTOMY Right 12/16/2021   Procedure: XI ROBOTIC ASSISTED LAPAROSCOPIC RADICAL NEPHRECTOMY;  Surgeon: Alexis Frock, MD;  [facility];  Service: Urology;  Laterality: Right;  3 HRS   STONE EXTRACTION WITH BASKET  06/12/2011   Procedure: STONE EXTRACTION WITH BASKET;  Surgeon: Claybon Jabs, MD;  Location: [facility];  Service: Urology;  Laterality: Right;   Patient Active Problem List   Diagnosis Date Noted   Renal mass 12/16/2021   Constipation 03/24/2021   Gout 03/23/2021   Primary renal papillary carcinoma, right ([place]) 02/18/2020   Hepatic steatosis 02/18/2020   Major depression in remission ([place]) 11/11/2019   Central sleep apnea due to Cheyne-Stokes respiration 08/31/2019   Hyperlipidemia associated with type 2 diabetes mellitus ([place]) 07/09/2019   Aortic atherosclerosis ([place]) by CXR on 06/10/2018 06/10/2018   Type 2 diabetes mellitus with stage 3a chronic kidney disease, without long-term current use of insulin ([place]) 01/21/2018   OSA treated with BiPAP 01/03/2017  REM sleep behavior disorder 12/06/2016   CKD stage 3 due to type 2 diabetes mellitus ([place]) 10/23/2016   Medication management 10/23/2016   Vitamin D deficiency 10/23/2016   Parkinson's disease 01/04/2016   Seasonal and perennial allergic rhinitis 06/21/2014   Asthma, mild intermittent, well-controlled 06/21/2014   Pseudomonas aeruginosa colonization 10/08/2012   CAD (coronary artery disease) 11/15/2011   Essential hypertension 11/15/2011   Morbid obesity with BMI of 40.0-44.9, adult ([place]) 11/15/2011    ONSET DATE: 05/30/22 (referral date)  REFERRING DIAG: Parkinson's  Disease  THERAPY DIAG:  Other symptoms and signs involving the nervous system  Other lack of coordination  Abnormal posture  Tremor  Unsteadiness on feet  Other symptoms and signs involving the musculoskeletal system  Rationale for Evaluation and Treatment: Rehabilitation  SUBJECTIVE:   SUBJECTIVE STATEMENT: I'm good   Pt accompanied by: self  PERTINENT HISTORY: Pt returns today for 6-34monthre-evaluation as recommended when last discharged from occupational therapy 11/17/21.  Pt reports that he is having more "Parkinson's Days."  PMH includes:  CAD, HTN, HDL, obesity, asthma, DM, CKD, vitamin D deficiency, REM sleep disorder, OSA, aortic atherosclerosis, hx of gout, GERD, hx of anxiety, tumor on R kidney s/p nephrectomy  PRECAUTIONS: Fall  WEIGHT BEARING RESTRICTIONS: No  PAIN:  Are you having pain? No  FALLS: Has patient fallen in last 6 months? No  LIVING ENVIRONMENT: Lives with: lives with their family and lives with their spouse Lives in: House/apartment  PLOF: Independent, Vocation/Vocational requirements: retired, and Leisure: walking, HEP  PATIENT GOALS: improve L hand, ADLs  OBJECTIVE:   HAND DOMINANCE: Right  ADLs:  Transfers/ambulation related to ADLs:  doesn't get on floor, min difficulty with bed mobility Eating: rarely switches hands, occasional assist with cutting meat, difficulty opening unsealed bottle (use both hands), opening packages Grooming: mod I UB Dressing: mod I, doesn't wear buttons LB Dressing: mod I Toileting: mod I Bathing: mod I Tub Shower transfers: mod I   IADLs: Shopping: mod I Light housekeeping: wife does most, mod I for tasks performed  Meal Prep: wife does most, mod I for tasks performed Community mobility: backed into carport in rainstorm, hugging L side more  Medication management: independent Financial management: wife has always performed  Handwriting: 100% legible and Mild micrographia  MOBILITY STATUS:  Independent  Just finished PT recently.  POSTURE COMMENTS:  rounded shoulders  ACTIVITY TOLERANCE: Activity tolerance: denies difficulty  FUNCTIONAL OUTCOME MEASURES: Standing functional reach: Right: 12 inches; Left: 10 inches Fastening/unfastening 3 buttons: 62.60 Physical performance test: PPT#2 (simulated eating) not tested & PPT#4 (donning/doffing jacket): 18.59sec  COORDINATION: 9 Hole Peg test: Right: 49.91, 34.12  sec; Left: 66.47, 39.19sec with trunk compensation noted with each hand (decr in-hand manipulation) Box and Blocks:  Right 42 blocks, Left 42blocks Tremors: Resting, Right, and Left  UE ROM:   Bilateral shoulder flex R-145* with elbow ext WNL, L-150* with elbow ext WNL , mild decr supination bilaterally R>L and mild decr R MP ext  Otherwise bilateral UE ROM grossly WNL  UE MMT:   Not tested  SENSATION: Not tested  MUSCLE TONE: RUE: Moderate and Rigidity and LUE: Mild and Rigidity  COGNITION: Overall cognitive status:  bradyphrenia with word recall  OBSERVATIONS: Bradykinesia   TODAY'S TREATMENT:  Standing, closed-chain shoulder flex with ball with BUEs (floor>overhead), then diagonals to each side with min v.c for large amplitude movements.  Standing, Practiced grasp/release of cylinder objects with use of PWR! Hands/large amplitude movements with min cueing incorporating trunk rotation and wt. shift.  Opening/closing bottles with use of large amplitude movement strategies with min cueing.  Practiced buttoning/unbuttoning shirt on table top with min v.c. for use of PWR! Hands prior to buttoning and use of deliberate/large amplitude movements for fastening.  Simulated ADLs with bag with focus/min cues for large amplitude movements:  Donning/doffing pull-over shirt, donning/doffing pants, pulling bag into hand for clothing  adjustment/donning socks, pulling shirt down in back/drying back.   Dealing cards with thumb with focus/min v.c. for large amplitude movements with each hand.    PATIENT EDUCATION: Education details: see above  Person educated: Patient Education method: Explanation, Demonstration, and Verbal cues Education comprehension: verbalized understanding and returned demonstration  HOME EXERCISE PROGRAM: Not yet issued  GOALS: Goals reviewed with patient? Yes  SHORT TERM GOALS: Target date: n/a--see LTGs   LONG TERM GOALS: Target date: 07/08/22  Pt will be independent with updated PD-specific HEP. Goal status: INITIAL  2.  Pt will verbalize understanding of updated strategies for ADLs/IADLs to incr ease, incr safety, and decr risk of future complications (including strategies for eating, opening bottles, etc).   Goal status: INITIAL  3.  Pt will be improve bilateral coordination as shown by fastening/unfastening 3 buttons in less than 48sec. Baseline: 62.60sec Goal status: INITIAL  4.  Pt will perform bilateral functional reaching/coordination as shown by improving score on box and blocks test by at least 3 bilaterally. Baseline: R-42, L-42 blocks Goal status: INITIAL    ASSESSMENT:  CLINICAL IMPRESSION: Pt is progressing towards goals with improving coordination and demo use of large amplitude movement strategies with min v.c.  PERFORMANCE DEFICITS: in functional skills including ADLs, IADLs, coordination, dexterity, tone, ROM, strength, flexibility, Fine motor control, Gross motor control, mobility, balance, and UE functional use, cognitive skills including  bradyphrenia , and psychosocial skills including habits.   IMPAIRMENTS: are limiting patient from ADLs, IADLs, and leisure.   COMORBIDITIES:  may have co-morbidities  that affects occupational performance. Patient will benefit from skilled OT to address above impairments and improve overall function.  MODIFICATION OR  ASSISTANCE TO COMPLETE EVALUATION: Min-Moderate modification of tasks or assist with assess necessary to complete an evaluation.  OT OCCUPATIONAL PROFILE AND HISTORY: Detailed assessment: Review of records and additional review of physical, cognitive, psychosocial history related to current functional performance.  CLINICAL DECISION MAKING: Moderate - several treatment options, min-mod task modification necessary  REHAB POTENTIAL: Good  EVALUATION COMPLEXITY: Moderate    PLAN:  OT FREQUENCY: 2x/week  OT DURATION: 4 weeks +eval  PLANNED INTERVENTIONS: self care/ADL training, therapeutic exercise, therapeutic activity, neuromuscular re-education, manual therapy, passive range of motion, balance training, functional mobility training, splinting, ultrasound, paraffin, fluidotherapy, moist heat, cryotherapy, patient/family education, cognitive remediation/compensation, energy conservation, and DME and/or AE instructions  RECOMMENDED OTHER SERVICES: none at this time  CONSULTED AND AGREED WITH PLAN OF CARE: Patient  PLAN FOR NEXT SESSION: continue to review/update coordination HEP, review ADL strategies prn (?eating, buttons), dynamic reach   [facility], OTR/L 06/15/2022, 8:11 AM

## 2022-06-15 ENCOUNTER — Encounter: Payer: Self-pay | Admitting: Occupational Therapy

## 2022-06-15 ENCOUNTER — Ambulatory Visit: Payer: Medicare Other | Admitting: Occupational Therapy

## 2022-06-15 DIAGNOSIS — R278 Other lack of coordination: Secondary | ICD-10-CM | POA: Diagnosis not present

## 2022-06-15 DIAGNOSIS — R29898 Other symptoms and signs involving the musculoskeletal system: Secondary | ICD-10-CM

## 2022-06-15 DIAGNOSIS — R29818 Other symptoms and signs involving the nervous system: Secondary | ICD-10-CM

## 2022-06-15 DIAGNOSIS — R293 Abnormal posture: Secondary | ICD-10-CM

## 2022-06-15 DIAGNOSIS — R2681 Unsteadiness on feet: Secondary | ICD-10-CM

## 2022-06-15 DIAGNOSIS — R251 Tremor, unspecified: Secondary | ICD-10-CM

## 2022-06-15 DIAGNOSIS — G20A1 Parkinson's disease without dyskinesia, without mention of fluctuations: Secondary | ICD-10-CM | POA: Diagnosis not present

## 2022-06-20 ENCOUNTER — Encounter: Payer: Medicare Other | Admitting: Occupational Therapy

## 2022-06-25 NOTE — Therapy (Signed)
OUTPATIENT OCCUPATIONAL THERAPY PARKINSON'S TREATMENT  Patient Name: Todd Rice MRN: [id number] DOB:Jul 29, 1955, 67 y.o., male Today's Date: 06/26/2022  PCP: Dr. Unk Pinto REFERRING PROVIDER: Ernestine Mcmurray, NP  END OF SESSION:  OT End of Session - 06/26/22 0807     Visit Number 4    Number of Visits 9    Date for OT Re-Evaluation 07/08/22    Authorization Type [employer] Medicare, follows Medicare guidelines    Authorization - Visit Number 4    Authorization - Number of Visits 10    Progress Note Due on Visit 10    OT Start Time 0804    OT Stop Time 0845    OT Time Calculation (min) 41 min    Activity Tolerance Patient tolerated treatment well    Behavior During Therapy [facility] for tasks assessed/performed                Past Medical History:  Diagnosis Date   Adult BMI 50.0-59.9 kg/sq m    52.77   Anal fissure    Cancer ([place])    kidney cancer   Diabetes mellitus without complication ([place])    borderline   Elevated PSA 08/13/2019   H/O: gout    STABLE   Hepatic steatosis 10/01/2011   History of kidney stones    Hyperlipidemia    Hypertension    Kidney tumor    right upper kidney tumor, monitoring   OSA on CPAP    [employer]   Parkinson disease 01/04/2016   REM sleep behavior disorder 12/06/2016   Right ureteral stone    Ureteral calculus, right 06/12/2011   Vitamin D deficiency    Weakness    Past Surgical History:  Procedure Laterality Date   ANAL FISSURE REPAIR  10-12-2000   COLONOSCOPY N/A 07/01/2012   Procedure: COLONOSCOPY;  Surgeon: Lafayette Dragon, MD;  Location: [facility];  Service: Endoscopy;  Laterality: N/A;   CYSTOSCOPY WITH RETROGRADE PYELOGRAM, URETEROSCOPY AND STENT PLACEMENT Bilateral 02/26/2013   Procedure: CYSTOSCOPY WITH RETROGRADE PYELOGRAM, URETEROSCOPY AND STENT PLACEMENT;  Surgeon: Alexis Frock, MD;  Location: [facility];  Service: Urology;  Laterality: Bilateral;   HOLMIUM LASER APPLICATION Bilateral [id number]    Procedure: HOLMIUM LASER APPLICATION;  Surgeon: Alexis Frock, MD;  Location: [facility];  Service: Urology;  Laterality: Bilateral;   kidney stone removal     06/2011-stent placement   LEFT MEDIAL FEMORAL CONDYLE DEBRIDEMENT & DRILLING/ REMOVAL LOOSE BODY  09-15-2003   NASAL SEPTUM SURGERY  1985   ROBOT ASSISTED LAPAROSCOPIC NEPHRECTOMY Right 12/16/2021   Procedure: XI ROBOTIC ASSISTED LAPAROSCOPIC RADICAL NEPHRECTOMY;  Surgeon: Alexis Frock, MD;  Location: [facility];  Service: Urology;  Laterality: Right;  3 HRS   STONE EXTRACTION WITH BASKET  06/12/2011   Procedure: STONE EXTRACTION WITH BASKET;  Surgeon: Claybon Jabs, MD;  Location: [facility];  Service: Urology;  Laterality: Right;   Patient Active Problem List   Diagnosis Date Noted   Renal mass 12/16/2021   Constipation 03/24/2021   Gout 03/23/2021   Primary renal papillary carcinoma, right ([place]) 02/18/2020   Hepatic steatosis 02/18/2020   Major depression in remission ([place]) 11/11/2019   Central sleep apnea due to Cheyne-Stokes respiration 08/31/2019   Hyperlipidemia associated with type 2 diabetes mellitus ([place]) 07/09/2019   Aortic atherosclerosis ([place]) by CXR on 06/10/2018 06/10/2018   Type 2 diabetes mellitus with stage 3a chronic kidney disease, without long-term current use of insulin ([place]) 01/21/2018   OSA treated with BiPAP  01/03/2017   REM sleep behavior disorder 12/06/2016   CKD stage 3 due to type 2 diabetes mellitus ([place]) 10/23/2016   Medication management 10/23/2016   Vitamin D deficiency 10/23/2016   Parkinson's disease 01/04/2016   Seasonal and perennial allergic rhinitis 06/21/2014   Asthma, mild intermittent, well-controlled 06/21/2014   Pseudomonas aeruginosa colonization 10/08/2012   CAD (coronary artery disease) 11/15/2011   Essential hypertension 11/15/2011   Morbid obesity with BMI of 40.0-44.9, adult ([place]) 11/15/2011    ONSET DATE: 05/30/22 (referral date)  REFERRING DIAG: Parkinson's  Disease  THERAPY DIAG:  Other symptoms and signs involving the nervous system  Other lack of coordination  Abnormal posture  Tremor  Unsteadiness on feet  Other symptoms and signs involving the musculoskeletal system  Rationale for Evaluation and Treatment: Rehabilitation  SUBJECTIVE:   SUBJECTIVE STATEMENT: I'm doing good.  Pt wishes to d/c today.   Pt accompanied by: self  PERTINENT HISTORY: Pt returns today for 6-20monthre-evaluation as recommended when last discharged from occupational therapy 11/17/21.  Pt reports that he is having more "Parkinson's Days."  PMH includes:  CAD, HTN, HDL, obesity, asthma, DM, CKD, vitamin D deficiency, REM sleep disorder, OSA, aortic atherosclerosis, hx of gout, GERD, hx of anxiety, tumor on R kidney s/p nephrectomy  PRECAUTIONS: Fall  WEIGHT BEARING RESTRICTIONS: No  PAIN:  Are you having pain? No  FALLS: Has patient fallen in last 6 months? No  LIVING ENVIRONMENT: Lives with: lives with their family and lives with their spouse Lives in: House/apartment  PLOF: Independent, Vocation/Vocational requirements: retired, and Leisure: walking, HEP  PATIENT GOALS: improve L hand, ADLs  OBJECTIVE:   HAND DOMINANCE: Right  ADLs:  Transfers/ambulation related to ADLs:  doesn't get on floor, min difficulty with bed mobility Eating: rarely switches hands, occasional assist with cutting meat, difficulty opening unsealed bottle (use both hands), opening packages Grooming: mod I UB Dressing: mod I, doesn't wear buttons LB Dressing: mod I Toileting: mod I Bathing: mod I Tub Shower transfers: mod I   IADLs: Shopping: mod I Light housekeeping: wife does most, mod I for tasks performed  Meal Prep: wife does most, mod I for tasks performed Community mobility: backed into carport in rainstorm, hugging L side more  Medication management: independent Financial management: wife has always performed  Handwriting: 100% legible and Mild  micrographia  MOBILITY STATUS: Independent  Just finished PT recently.  POSTURE COMMENTS:  rounded shoulders  ACTIVITY TOLERANCE: Activity tolerance: denies difficulty  FUNCTIONAL OUTCOME MEASURES: Standing functional reach: Right: 12 inches; Left: 10 inches Fastening/unfastening 3 buttons: 62.60 Physical performance test: PPT#2 (simulated eating) not tested & PPT#4 (donning/doffing jacket): 18.59sec  COORDINATION: 9 Hole Peg test: Right: 49.91, 34.12  sec; Left: 66.47, 39.19sec with trunk compensation noted with each hand (decr in-hand manipulation) Box and Blocks:  Right 42 blocks, Left 42blocks Tremors: Resting, Right, and Left  UE ROM:   Bilateral shoulder flex R-145* with elbow ext WNL, L-150* with elbow ext WNL , mild decr supination bilaterally R>L and mild decr R MP ext  Otherwise bilateral UE ROM grossly WNL  UE MMT:   Not tested  SENSATION: Not tested  MUSCLE TONE: RUE: Moderate and Rigidity and LUE: Mild and Rigidity  COGNITION: Overall cognitive status:  bradyphrenia with word recall  OBSERVATIONS: Bradykinesia   TODAY'S TREATMENT:  Sitting, closed-chain shoulder flex with ball with BUEs (floor>overhead), then diagonals to each side with min v.c for large amplitude movements.  Standing, Flipping cards with use of PWR! Hands/large amplitude movements/functional reach with min cueing incorporating trunk rotation and wt. Shift.  PWR! Hands to slide cards across table with each hand with min cueing.   Practiced buttoning/unbuttoning shirt on table top with min v.c. for use of PWR! Hands prior to buttoning, incr use of R hand, and use of deliberate/large amplitude movements for fastening.  Checked goals and discussed progress and recommendations for follow-up.  Pt currently scheduled for PT/ST screens in approx 6 months and will consider  scheduling an OT screen/eval at that time prn as pt unsure what site he would like to use.    Writing name, phone #, email address with good legibility and size.       PATIENT EDUCATION: Education details: reviewed button strategy and incr effort with R hand as well as performing coordination HEP with both hands simultaneously for improved bilateral coordination Person educated: Patient Education method: Explanation, Demonstration, and Verbal cues Education comprehension: verbalized understanding  HOME EXERCISE PROGRAM: Reviewed previous coordination HEP and hand ROM, PWR! hands  GOALS: Goals reviewed with patient? Yes  SHORT TERM GOALS: Target date: n/a--see LTGs   LONG TERM GOALS: Target date: 07/08/22  Pt will be independent with updated PD-specific HEP. Goal status: MET  2.  Pt will verbalize understanding of updated strategies for ADLs/IADLs to incr ease, incr safety, and decr risk of future complications (including strategies for eating, opening bottles, etc).   Goal status: MET  3.  Pt will be improve bilateral coordination as shown by fastening/unfastening 3 buttons in less than 48sec. Baseline: 62.60sec Goal status: Partially met.   06/26/22: 53.09sec, 47.41sec (after review of strategies)  4.  Pt will perform bilateral functional reaching/coordination as shown by improving score on box and blocks test by at least 3 bilaterally. Baseline: R-42, L-42 blocks Goal status: Partially met.  06/26/22:  R- 44blocks (improved by 2), L- 45blocks (improved by 3--met)    ASSESSMENT:  CLINICAL IMPRESSION: Pt demo improved use of large amplitude movement and ADL strategies after review.     PERFORMANCE DEFICITS: in functional skills including ADLs, IADLs, coordination, dexterity, tone, ROM, strength, flexibility, Fine motor control, Gross motor control, mobility, balance, and UE functional use, cognitive skills including  bradyphrenia , and psychosocial skills including habits.    IMPAIRMENTS: are limiting patient from ADLs, IADLs, and leisure.   COMORBIDITIES:  may have co-morbidities  that affects occupational performance. Patient will benefit from skilled OT to address above impairments and improve overall function.  MODIFICATION OR ASSISTANCE TO COMPLETE EVALUATION: Min-Moderate modification of tasks or assist with assess necessary to complete an evaluation.  OT OCCUPATIONAL PROFILE AND HISTORY: Detailed assessment: Review of records and additional review of physical, cognitive, psychosocial history related to current functional performance.  CLINICAL DECISION MAKING: Moderate - several treatment options, min-mod task modification necessary  REHAB POTENTIAL: Good  EVALUATION COMPLEXITY: Moderate    PLAN:  OT FREQUENCY: 2x/week  OT DURATION: 4 weeks +eval  PLANNED INTERVENTIONS: self care/ADL training, therapeutic exercise, therapeutic activity, neuromuscular re-education, manual therapy, passive range of motion, balance training, functional mobility training, splinting, ultrasound, paraffin, fluidotherapy, moist heat, cryotherapy, patient/family education, cognitive remediation/compensation, energy conservation, and DME and/or AE instructions  RECOMMENDED OTHER SERVICES: none at this time  CONSULTED AND AGREED WITH PLAN OF CARE: Patient  PLAN FOR NEXT SESSION:  d/c OT  OCCUPATIONAL THERAPY DISCHARGE SUMMARY  Visits from Start of Care: 4  Current functional level related to goals / functional outcomes: See above   Remaining deficits: Bradykinesia, rigidity, decr coordination, abnormal posture, Tremors   Education / Equipment: Pt was instructed in the following:  PD-specific HEP, adaptive strategies for ADLs/IADLs.  Pt verbalized understanding of all education provided.   Plan: Patient agrees to discharge.  Patient goals were partially met. Patient is being discharged due to being pleased with the current functional level.  Pt would benefit  from occupational therapy evaluation/screen in approx 6 months to assess for need for further therapy/functional changes due to progressive nature of diagnosis.    Chaunice Obie, OTR/L 06/26/2022, 1:54 PM

## 2022-06-26 ENCOUNTER — Encounter: Payer: Self-pay | Admitting: Occupational Therapy

## 2022-06-26 ENCOUNTER — Ambulatory Visit: Payer: Medicare Other | Admitting: Occupational Therapy

## 2022-06-26 DIAGNOSIS — R2681 Unsteadiness on feet: Secondary | ICD-10-CM | POA: Diagnosis not present

## 2022-06-26 DIAGNOSIS — R251 Tremor, unspecified: Secondary | ICD-10-CM | POA: Diagnosis not present

## 2022-06-26 DIAGNOSIS — R278 Other lack of coordination: Secondary | ICD-10-CM

## 2022-06-26 DIAGNOSIS — R29898 Other symptoms and signs involving the musculoskeletal system: Secondary | ICD-10-CM | POA: Diagnosis not present

## 2022-06-26 DIAGNOSIS — R29818 Other symptoms and signs involving the nervous system: Secondary | ICD-10-CM

## 2022-06-26 DIAGNOSIS — G20A1 Parkinson's disease without dyskinesia, without mention of fluctuations: Secondary | ICD-10-CM | POA: Diagnosis not present

## 2022-06-26 DIAGNOSIS — R293 Abnormal posture: Secondary | ICD-10-CM | POA: Diagnosis not present

## 2022-06-27 ENCOUNTER — Other Ambulatory Visit (HOSPITAL_BASED_OUTPATIENT_CLINIC_OR_DEPARTMENT_OTHER): Payer: Self-pay

## 2022-06-27 ENCOUNTER — Other Ambulatory Visit: Payer: Self-pay

## 2022-06-28 ENCOUNTER — Other Ambulatory Visit: Payer: Self-pay

## 2022-06-29 ENCOUNTER — Other Ambulatory Visit (HOSPITAL_COMMUNITY): Payer: Self-pay | Admitting: Urology

## 2022-06-29 ENCOUNTER — Ambulatory Visit (HOSPITAL_COMMUNITY)
Admission: RE | Admit: 2022-06-29 | Discharge: 2022-06-29 | Disposition: A | Discharge status: Home / Self Care | Payer: Medicare Other | Source: Ambulatory Visit | Attending: Urology | Admitting: Urology

## 2022-06-29 DIAGNOSIS — D4101 Neoplasm of uncertain behavior of right kidney: Secondary | ICD-10-CM | POA: Diagnosis not present

## 2022-06-29 DIAGNOSIS — I700 Atherosclerosis of aorta: Secondary | ICD-10-CM | POA: Diagnosis not present

## 2022-06-30 ENCOUNTER — Other Ambulatory Visit: Payer: Self-pay | Admitting: Adult Health

## 2022-06-30 ENCOUNTER — Other Ambulatory Visit (HOSPITAL_BASED_OUTPATIENT_CLINIC_OR_DEPARTMENT_OTHER): Payer: Self-pay

## 2022-07-03 ENCOUNTER — Other Ambulatory Visit (HOSPITAL_BASED_OUTPATIENT_CLINIC_OR_DEPARTMENT_OTHER): Payer: Self-pay

## 2022-07-03 DIAGNOSIS — N200 Calculus of kidney: Secondary | ICD-10-CM | POA: Diagnosis not present

## 2022-07-03 DIAGNOSIS — C641 Malignant neoplasm of right kidney, except renal pelvis: Secondary | ICD-10-CM | POA: Diagnosis not present

## 2022-07-03 DIAGNOSIS — K7689 Other specified diseases of liver: Secondary | ICD-10-CM | POA: Diagnosis not present

## 2022-07-03 DIAGNOSIS — K8020 Calculus of gallbladder without cholecystitis without obstruction: Secondary | ICD-10-CM | POA: Diagnosis not present

## 2022-07-03 MED ORDER — ALLOPURINOL 300 MG PO TABS
300.0000 mg | ORAL_TABLET | Freq: Every day | ORAL | 3 refills | Status: DC
  Filled 2022-07-03: qty 90, 90d supply, fill #0
  Filled 2022-07-19 – 2022-09-28 (×2): qty 90, 90d supply, fill #1
  Filled 2022-12-25: qty 90, 90d supply, fill #2
  Filled 2023-04-12: qty 90, 90d supply, fill #3

## 2022-07-05 ENCOUNTER — Encounter: Payer: Self-pay | Admitting: Nurse Practitioner

## 2022-07-05 ENCOUNTER — Other Ambulatory Visit (HOSPITAL_BASED_OUTPATIENT_CLINIC_OR_DEPARTMENT_OTHER): Payer: Self-pay

## 2022-07-05 ENCOUNTER — Ambulatory Visit (INDEPENDENT_AMBULATORY_CARE_PROVIDER_SITE_OTHER): Payer: Medicare Other | Admitting: Nurse Practitioner

## 2022-07-05 VITALS — BP 120/74 | HR 80 | Temp 98.1°F | Ht 65.0 in | Wt 248.0 lb

## 2022-07-05 DIAGNOSIS — J4520 Mild intermittent asthma, uncomplicated: Secondary | ICD-10-CM | POA: Diagnosis not present

## 2022-07-05 DIAGNOSIS — I700 Atherosclerosis of aorta: Secondary | ICD-10-CM

## 2022-07-05 DIAGNOSIS — M1A9XX0 Chronic gout, unspecified, without tophus (tophi): Secondary | ICD-10-CM

## 2022-07-05 DIAGNOSIS — G20A1 Parkinson's disease without dyskinesia, without mention of fluctuations: Secondary | ICD-10-CM | POA: Diagnosis not present

## 2022-07-05 DIAGNOSIS — E1122 Type 2 diabetes mellitus with diabetic chronic kidney disease: Secondary | ICD-10-CM

## 2022-07-05 DIAGNOSIS — E1169 Type 2 diabetes mellitus with other specified complication: Secondary | ICD-10-CM | POA: Diagnosis not present

## 2022-07-05 DIAGNOSIS — N1831 Chronic kidney disease, stage 3a: Secondary | ICD-10-CM

## 2022-07-05 DIAGNOSIS — I2510 Atherosclerotic heart disease of native coronary artery without angina pectoris: Secondary | ICD-10-CM | POA: Diagnosis not present

## 2022-07-05 DIAGNOSIS — N183 Chronic kidney disease, stage 3 unspecified: Secondary | ICD-10-CM | POA: Diagnosis not present

## 2022-07-05 DIAGNOSIS — C641 Malignant neoplasm of right kidney, except renal pelvis: Secondary | ICD-10-CM

## 2022-07-05 DIAGNOSIS — Z79899 Other long term (current) drug therapy: Secondary | ICD-10-CM | POA: Diagnosis not present

## 2022-07-05 DIAGNOSIS — G4752 REM sleep behavior disorder: Secondary | ICD-10-CM

## 2022-07-05 DIAGNOSIS — I10 Essential (primary) hypertension: Secondary | ICD-10-CM | POA: Diagnosis not present

## 2022-07-05 DIAGNOSIS — E559 Vitamin D deficiency, unspecified: Secondary | ICD-10-CM

## 2022-07-05 DIAGNOSIS — K5900 Constipation, unspecified: Secondary | ICD-10-CM

## 2022-07-05 DIAGNOSIS — E785 Hyperlipidemia, unspecified: Secondary | ICD-10-CM

## 2022-07-05 DIAGNOSIS — Z2239 Carrier of other specified bacterial diseases: Secondary | ICD-10-CM | POA: Diagnosis not present

## 2022-07-05 DIAGNOSIS — G4733 Obstructive sleep apnea (adult) (pediatric): Secondary | ICD-10-CM

## 2022-07-05 DIAGNOSIS — Z6841 Body Mass Index (BMI) 40.0 and over, adult: Secondary | ICD-10-CM

## 2022-07-05 DIAGNOSIS — F325 Major depressive disorder, single episode, in full remission: Secondary | ICD-10-CM

## 2022-07-05 MED ORDER — OZEMPIC (0.25 OR 0.5 MG/DOSE) 2 MG/3ML ~~LOC~~ SOPN
PEN_INJECTOR | SUBCUTANEOUS | 1 refills | Status: DC
  Filled 2022-07-05: qty 3, 28d supply, fill #0
  Filled 2022-08-16: qty 3, 28d supply, fill #1

## 2022-07-05 NOTE — Patient Instructions (Signed)

## 2022-07-05 NOTE — Progress Notes (Signed)
Follow Up  Assessment and Plan:  Atherosclerosis of aorta (HCC) - per CXR 06/2018 Control blood pressure, cholesterol, glucose, increase exercise.   Coronary artery disease involving native coronary artery of native heart without angina pectoris Control blood pressure, cholesterol, glucose, increase exercise.  Go to the ER if any chest pain, shortness of breath, nausea, dizziness, severe HA, changes vision/speech  Essential hypertension Continue medications Discussed DASH (Dietary Approaches to Stop Hypertension) DASH diet is lower in sodium than a typical American diet. Cut back on foods that are high in saturated fat, cholesterol, and trans fats. Eat more whole-grain foods, fish, poultry, and nuts Remain active and exercise as tolerated daily.  Monitor BP at home-Call if greater than 130/80.  Check CMP/CBC   Controlled type 2 diabetes mellitus with diabetic dermatitis, without long-term current use of insulin ([employer]) Restart Ozempic Education: Reviewed 'ABCs' of diabetes management  Discussed goals to be met and/or maintained include A1C (<7) Blood pressure (<130/80) Cholesterol (LDL <70) Continue Eye Exam yearly  Continue Dental Exam Q6 mo Discussed dietary recommendations Discussed Physical Activity recommendations Foot exam UTD Check A1C   Hyperlipidemia associated with T2DM ([place]) Discussed lifestyle modifications. Recommended diet heavy in fruits and veggies, omega 3's. Decrease consumption of animal meats, cheeses, and dairy products. Remain active and exercise as tolerated. Continue to monitor. Check lipids/TSH   CKD III associated with T2DM ([place]) Discussed how what you eat and drink can aide in kidney protection. Stay well hydrated. Avoid high salt foods. Avoid NSAIDS. Keep BP and BG well controlled.   Take medications as prescribed. Remain active and exercise as tolerated daily. Maintain weight.  Continue to monitor. Check CMP/GFR/Microablumin   Class  3 obesity due to excess calories with serious comorbidity and body mass index (BMI) of 40.0 to 44.9 in adult [facility])  Discussed appropriate BMI Diet modification. Physical activity. Encouraged/praised to build confidence.  Parkinson disease ([place]) Continue neuro - f/u scheduled 09/05/22 Continue PT and OT routinely as needed   Medication management All medications discussed and reviewed in full. All questions and concerns regarding medications addressed.     Vitamin D deficiency Continue supplement; goal 60-100 Check and monitor levels  Seasonal and perennial allergic rhinitis Controlled Avoid triggers Allegra OTC, increase H20, allergy hygiene explained.  Asthma, mild intermittent, well-controlled Weight loss, PRN albuterol, avoid triggers  Right renal cell carcinoma (HCC) Dr. Tresa Moore following Active monitoring  Pseudomonas aeruginosa colonization Monitor, urology following Recently est with ID Dr. Tommy Medal for IV treatment if symptomatic  Controlled  OSA treated with BiPAP Dr. Brett Fairy following- continue BiPAP, Weight loss still advised.   REM sleep behavior disorder Continue neuro follow up  Major depression in remission ([place]) In remission off of medication; monitor   Constipation Well managed alternating miralax and senokot Increase frequency if needed Push fluids  Major depression Reviewed relaxation techniques.  Sleep hygiene. Recommended mindfulness meditation and exercise.   Encouraged personality growth wand development through coping techniques and problem-solving skills. Limit/Decrease/Monitor drug/alcohol intake.   Limit use of xanax Continue to monitor  Gout No recent flare Continue Allopurinol Discussed low purine diet.   Orders Placed This Encounter  Procedures   CBC with Differential/Platelet   COMPLETE METABOLIC PANEL WITH GFR   Lipid panel   Hemoglobin A1c   VITAMIN D 25 Hydroxy (Vit-D Deficiency, Fractures)   Meds ordered this  encounter  Medications   Semaglutide,0.25 or 0.'5MG'$ /DOS, (OZEMPIC, 0.25 OR 0.5 MG/DOSE,) 2 MG/3ML SOPN    Sig: Start by injecting 0.25 mg  into skin of stomach once weekly; if doing well increase to 0.5 mg in 4 weeks and continue at this dose.    Dispense:  3 mL    Refill:  1    Order Specific Question:   Supervising Provider    Answer:   Unk Pinto [phone]   The Plan of Care is based on a patient-centered health care approach known as shared decision making - the decisions, tests and treatments allow for patient preferences and values to be balanced with clinical evidence.    Notify office for further evaluation and treatment, questions or concerns if any reported s/s fail to improve.   The patient was advised to call back or seek an in-person evaluation if any symptoms worsen or if the condition fails to improve as anticipated.   Further disposition pending results of labs. Discussed med's effects and SE's.    I discussed the assessment and treatment plan with the patient. The patient was provided an opportunity to ask questions and all were answered. The patient agreed with the plan and demonstrated an understanding of the instructions.  Discussed med's effects and SE's. Screening labs and tests as requested with regular follow-up as recommended.  I provided 25 minutes of face-to-face time during this encounter including counseling, chart review, and critical decision making was preformed.   Future Appointments  Date Time Provider [place]  07/11/2022  3:00 PM Dorian Pod, [place] [facility] None  09/05/2022  8:30 AM Ward Givens, NP [facility] None  10/06/2022  9:30 AM Darrol Jump, NP [facility] None  01/04/2023 12:00 PM Cori Razor [facility]  01/04/2023 12:15 PM Frazier Butt, PT [facility]  04/02/2023  3:00 PM Darrol Jump, NP [facility] None     HPI 67 y.o. male patient presents for a 3 month follow up. He has CAD (coronary artery  disease); Essential hypertension; Morbid obesity with BMI of 40.0-44.9, adult ([place]); Pseudomonas aeruginosa colonization; Seasonal and perennial allergic rhinitis; Asthma, mild intermittent, well-controlled; Parkinson's disease; CKD stage 3 due to type 2 diabetes mellitus ([facility]); Medication management; Vitamin D deficiency; REM sleep behavior disorder; OSA treated with BiPAP; Type 2 diabetes mellitus with stage 3a chronic kidney disease, without long-term current use of insulin ([place]); Aortic atherosclerosis ([place]) by CXR on 06/10/2018; Hyperlipidemia associated with type 2 diabetes mellitus ([place]); Central sleep apnea due to Cheyne-Stokes respiration; Major depression in remission ([place]); Primary renal papillary carcinoma, right ([place]); Hepatic steatosis; Gout; Constipation; and Renal mass on their problem list.  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 Accompanied by his wife.   Overall he reports feeling well although he has noticed increase in drooling, from Parkinson's.  Also feels as though is left had tremor has increased.  He continues to be able to take in food and drink.  Feels as though his thoughts are slower and speech is slower and at times mildly slurred.  He continues to work with PT and OT weekly.   Follows with neurology Dr. Julius Bowels Tat for Parkinson's. He reports that he has been taking the mirapex and sinemet and feels well controlled. He has a f/u scheduled for 09/05/22  Dr. [name] retiring, req referral to Dr. Carles Collet.   He has depression in remission off of medication (wellbutrin). He has xanax that he uses occasionally, typically 2-3 tabs per month. Rare use of ambien for sleep (prescribed by Dr. Brett Fairy).   He is on BiPAP for OSA/central sleep apnea per Dr. Brett Fairy, endorses 100% compliance and restorative sleep.  Also has R renal papillary carcinoma, Follows annually with Dr. Tresa Moore, has been stable for several years. Had nephrectomy 12/2021.  He has hx of chronic pseudomonas bladder  colonization, now est with Dr. Tommy Medal so if recurrent UTI sx planning on IV abx.   He is on miralax and senokot once a day for constipation and reports good results.   Has mild asthma, has albuterol uses q2-3 days PRN if needed when outdoors and ragweed season.    BMI is Body mass index is 41.27 kg/m., he has been working on , diet and exercise, strength exercises and walking several times a week. He continues with OT/PT. Wt Readings from Last 3 Encounters:  07/05/22 248 lb (112.5 kg)  03/28/22 244 lb 3.2 oz (110.8 kg)  02/08/22 241 lb 12.8 oz (109.7 kg)   Aortic atherosclerosis per CXR 2020. Normal stress test 2013 by Dr. Percival Spanish due to CAD per CT.  His blood pressure has been controlled at home, today their BP is BP: 120/74  He does workout. He denies chest pain, shortness of breath, dizziness.  He is on cholesterol medication (zetia 10 mg daily, rosuvastatin 20 mg daily, didn't tolerate 40 mg daily) and denies myalgias. His cholesterol is not at goal of 70 or less. The cholesterol last visit was:   Lab Results  Component Value Date   CHOL 94 03/28/2022   HDL 34 (L) 03/28/2022   LDLCALC 39 03/28/2022   TRIG 130 03/28/2022   CHOLHDL 2.8 03/28/2022   He has been working on diet and exercise for diabetes CKD, he is on ARB Hyperlipemia on crestor Aortic atherosclerosis he is on bASA he is on metformin 4 a day  Only takes glipizide if his sugar is 150+ in AM takes 1/2, 200+ takes whole if 200+ His sugars have been running 150s, rare up to 175 Was improved with ozempic 0.25 mg weekly (sample) - denies SE however has been off of this medication since 01/2022 d/t being in the "donut hole." He denies foot ulcerations, hyperglycemia, hypoglycemia , increased appetite, nausea, paresthesia of the feet, polydipsia, polyuria, visual disturbances, vomiting and weight loss.  Last A1C in the office was:  Lab Results  Component Value Date   HGBA1C 6.8 (H) 03/28/2022   He has CKD III  associated with T2DM monitored at this office, was slightly lower last check:  Lab Results  Component Value Date   EGFR 42 (L) 03/28/2022   Patient is on Vitamin D supplement.   Lab Results  Component Value Date   VD25OH 72 03/28/2022   Hx of PSA elevation, follows with Dr. Tresa Moore. Denies LUTS.  Lab Results  Component Value Date   PSA 2.85 03/28/2022   Has a hx of gout.  Currently treated with allopurinol.  Report medication compliance.  Has not had a recent flare.    Current Medications:   Current Outpatient Medications (Endocrine & Metabolic):    glipiZIDE (GLUCOTROL) 5 MG tablet, Take 1/2 tab as needed for glucose above 150. (Patient taking differently: Take 2.5-5 mg by mouth See admin instructions. Take 2.5 mg as needed for glucose above 150-199, take 5 mg as needed for glucose 200 or higher)   metFORMIN (GLUCOPHAGE-XR) 500 MG 24 hr tablet, TAKE 2 TABLETS BY MOUTH 2 TIMES DAILY   Semaglutide,0.25 or 0.'5MG'$ /DOS, (OZEMPIC, 0.25 OR 0.5 MG/DOSE,) 2 MG/3ML SOPN, Start by injecting 0.25 mg into skin of stomach once weekly; if doing well increase to 0.5 mg in 4 weeks and continue at  this dose.   Current Outpatient Medications (Cardiovascular):    ezetimibe (ZETIA) 10 MG tablet, Take  1 tablet  Daily  for Cholesterol   rosuvastatin (CRESTOR) 20 MG tablet, Take 1 tablet (20 mg total) by mouth daily.   valsartan-hydrochlorothiazide (DIOVAN-HCT) 320-25 MG tablet, Take 1 tablet by mouth daily for blood pressure   Current Outpatient Medications (Respiratory):    albuterol (VENTOLIN HFA) 108 (90 Base) MCG/ACT inhaler, INHALE 2 PUFFS EVERY 4 HOURS (15 MINUTES APART) FOR ASTHMA RESCUE   sodium chloride (OCEAN) 0.65 % SOLN nasal spray, Place 1 spray into both nostrils as needed for congestion.   Current Outpatient Medications (Analgesics):    allopurinol (ZYLOPRIM) 300 MG tablet, Take 1 tablet (300 mg total) by mouth daily to prevent gout.   HYDROcodone-acetaminophen (NORCO) 5-325 MG tablet,  Take 1-2 tablets by mouth every 6 (six) hours as needed for moderate pain or severe pain.     Current Outpatient Medications (Other):    ALPRAZolam (XANAX) 0.5 MG tablet, Take 1 tab up to three times a day only if needed for severe anxiety or panic attack. Avoid daily use.   calcium carbonate (TUMS - DOSED IN MG ELEMENTAL CALCIUM) 500 MG chewable tablet, Chew 1 tablet by mouth daily as needed for indigestion or heartburn.   carbidopa-levodopa (SINEMET IR) 25-250 MG tablet, TAKE 1 TABLET BY MOUTH 3 TIMES DAILY (Patient taking differently: Take 1 tablet by mouth 3 (three) times daily. PT states takes at 0630am, 1230pm and 630pm at nite.  PT wants to stay on schedule within 30 minutes of these times at all possible.)   Cholecalciferol (VITAMIN D) 50 MCG (2000 UT) tablet, Take 2,000 Units by mouth 2 (two) times daily.   COVID-19 mRNA vaccine 2023-2024 (COMIRNATY) SUSP injection, Inject into the muscle.   fosfomycin (MONUROL) 3 g PACK, 1 packet PO Begin today and repeat on Thursday   influenza vaccine adjuvanted (FLUAD) 0.5 ML injection, Inject into the muscle.   Magnesium 250 MG TABS, Take 1 tablet (250 mg total) by mouth 2 (two) times daily with a meal. (Patient taking differently: Take 1 tablet by mouth daily.)   Menthol, Topical Analgesic, (BIOFREEZE EX), Apply 1 Application topically daily as needed (pain).   polyethylene glycol powder (GLYCOLAX/MIRALAX) 17 GM/SCOOP powder, Take 17 g by mouth every other day. Alternating days with senna   pramipexole (MIRAPEX) 1 MG tablet, Take 1 tablet (1 mg total) by mouth 3 (three) times daily.   RSV vaccine recomb adjuvanted (AREXVY) 120 MCG/0.5ML injection, Inject into the muscle.   senna (SENOKOT) 8.6 MG tablet, Take 2 tablets by mouth every other day. Alternating days with miralax   Facility-Administered Medications Ordered in Other Visits (Other):    polyethylene glycol powder (GLYCOLAX/MIRALAX) container 255 g No current facility-administered  medications for this visit.  Health Maintenance:  Immunization History  Administered Date(s) Administered   COVID-19, mRNA, vaccine(Comirnaty)12 years and older 02/15/2022   Influenza Inj Mdck Quad With Preservative 02/22/2017, 01/21/2018, 02/06/2019   Influenza, High Dose Seasonal PF 02/18/2020, 02/10/2021, 02/07/2022   Influenza-Unspecified 02/03/2013, 02/05/2014   PFIZER Comirnaty(Gray Top)Covid-19 Tri-Sucrose Vaccine 08/17/2020, 02/17/2022   PFIZER(Purple Top)SARS-COV-2 Vaccination 06/14/2019, 07/09/2019, 03/06/2020   Pfizer Covid-19 Vaccine Bivalent Booster 58yr & up 01/27/2021   Pneumococcal Conjugate-13 02/18/2020   Pneumococcal Polysaccharide-23 03/24/2021   Pneumococcal-Unspecified 11/05/2012   Respiratory Syncytial Virus Vaccine,Recomb Aduvanted(Arexvy) 04/13/2022   Tdap 03/26/2015, 09/07/2021   Health Maintenance  Topic Date Due   Zoster Vaccines- Shingrix (1 of 2) Never done  OPHTHALMOLOGY EXAM  09/17/2020   Diabetic kidney evaluation - Urine ACR  03/24/2022   FOOT EXAM  03/24/2022   COVID-19 Vaccine (7 - 2023-24 season) 04/14/2022   COLONOSCOPY (Pts 45-27yr Insurance coverage will need to be confirmed)  07/01/2022   Medicare Annual Wellness (AWV)  09/21/2022   HEMOGLOBIN A1C  09/26/2022   Diabetic kidney evaluation - eGFR measurement  03/29/2023   DTaP/Tdap/Td (3 - Td or Tdap) 09/08/2031   Pneumonia Vaccine 68 Years old  Completed   INFLUENZA VACCINE  Completed   Hepatitis C Screening  Completed   HPV VACCINES  Aged Out    Patient Care Team: MUnk Pinto [name] WKeene Breath, MD (Ophthalmology) MAlexis Frock MD as Consulting Physician (Urology) BLafayette Dragon MD (Inactive) as Consulting Physician (Gastroenterology) [facility]ewton Pigg [facility](Inactive) as Pharmacist (Pharmacist)   Medical History:  Past Medical History:  Diagnosis Date   Adult BMI 50.0-59.9 kg/sq m    52.77   Anal fissure    Cancer ([place]     kidney cancer   Diabetes mellitus without complication ([place]    borderline   Elevated PSA 08/13/2019   H/O: gout    STABLE   Hepatic steatosis 10/01/2011   History of kidney stones    Hyperlipidemia    Hypertension    Kidney tumor    right upper kidney tumor, monitoring   OSA on CPAP    [employer]   Parkinson disease 01/04/2016   REM sleep behavior disorder 12/06/2016   Right ureteral stone    Ureteral calculus, right 06/12/2011   Vitamin D deficiency    Weakness    Allergies Allergies  Allergen Reactions   Ciprofloxacin Hives   Ceftriaxone Hives    SURGICAL HISTORY He  has a past surgical history that includes Anal fissure repair (10-12-2000); Nasal septum surgery (1985); LEFT MEDIAL FEMORAL CONDYLE DEBRIDEMENT & DRILLING/ REMOVAL LOOSE BODY (09-15-2003); Stone extraction with basket (06/12/2011); kidney stone removal; Colonoscopy (N/A, 07/01/2012); Cystoscopy with retrograde pyelogram, ureteroscopy and stent placement (Bilateral, 02/26/2013); Holmium laser application (Bilateral, 02/26/2013); and Robot assisted laparoscopic nephrectomy (Right, 12/16/2021). FAMILY HISTORY His family history includes Cancer in his father; Heart disease in his brother, maternal aunt, mother, and paternal grandmother; Hyperlipidemia in his brother, maternal aunt, mother, and paternal grandmother; Hypertension in his maternal aunt and mother; Lung cancer in his father; Melanoma in his brother; Stroke in his maternal aunt. SOCIAL HISTORY He  reports that he has quit smoking. He has never used smokeless tobacco. He reports that he does not drink alcohol and does not use drugs.   Review of Systems:  Review of Systems  Constitutional:  Negative for malaise/fatigue and weight loss.  HENT:  Negative for hearing loss and tinnitus.   Eyes:  Negative for blurred vision and double vision.  Respiratory:  Negative for cough, shortness of breath and wheezing.   Cardiovascular:  Negative for chest pain,  palpitations, orthopnea, claudication and leg swelling.  Gastrointestinal:  Positive for constipation. Negative for abdominal pain, blood in stool, diarrhea, heartburn, melena, nausea and vomiting.  Genitourinary: Negative.   Musculoskeletal:  Negative for joint pain and myalgias.  Skin:  Negative for rash.  Neurological:  Positive for tremors (L hand). Negative for dizziness, tingling, sensory change, weakness and headaches.  Endo/Heme/Allergies:  Negative for polydipsia.  Psychiatric/Behavioral:  Negative for depression and memory loss. The patient has insomnia (occasionally ). The patient is not nervous/anxious ([place]).   All other systems reviewed and  are negative.   Physical Exam: Estimated body mass index is 41.27 kg/m as calculated from the following:   Height as of this encounter: '5\' 5"'$  (1.651 m).   Weight as of this encounter: 248 lb (112.5 kg). BP 120/74   Pulse 80   Temp 98.1 F (36.7 C)   Ht '5\' 5"'$  (1.651 m)   Wt 248 lb (112.5 kg)   SpO2 98%   BMI 41.27 kg/m   General Appearance: Well nourished, well dressed morbidly obese male in no apparent distress.  Eyes: PERRLA, EOMs, conjunctiva no swelling or erythema ENT/Mouth: Ear canals clear bilaterally with no erythema, swelling, discharge.  TMs normal bilaterally with no erythema, bulging, or retractions.  Oropharynx clear and moist with no exudate, swelling, or erythema.  Dentition normal.   Neck: Supple, thyroid normal. No bruits, JVD, cervical adenopathy Respiratory: Respiratory effort normal, BS equal bilaterally without rales, rhonchi, wheezing or stridor.  Cardio: RRR without murmurs, rubs or gallops. Brisk peripheral pulses without edema.  Chest: symmetric, with normal excursions Abdomen: Soft, morbidly obese limiting exam, nontender, no guarding, rebound, hernias, masses, or organomegaly. Musculoskeletal: Full ROM all peripheral extremities,5/5 strength, and normal gait.  Skin: Warm, dry without rashes, lesions,  ecchymosis. Neuro: A&Ox3, Cranial nerves intact, slow gait.  Overshoot of the right hand with finger to nose.  Tremor of the left hand with finger opposition>tremor of the right hand with finger opposition.  Sensation intact. No drooling, slow speech. Psych: Insight and Judgment appropriate.   Darrol Jump, DNP, AGNP-C 9:48 AM [facility]

## 2022-07-06 DIAGNOSIS — N200 Calculus of kidney: Secondary | ICD-10-CM | POA: Diagnosis not present

## 2022-07-06 DIAGNOSIS — C641 Malignant neoplasm of right kidney, except renal pelvis: Secondary | ICD-10-CM | POA: Diagnosis not present

## 2022-07-06 DIAGNOSIS — N3020 Other chronic cystitis without hematuria: Secondary | ICD-10-CM | POA: Diagnosis not present

## 2022-07-06 LAB — CBC WITH DIFFERENTIAL/PLATELET
Absolute Monocytes: 460 cells/uL (ref 200–950)
Basophils Absolute: 22 cells/uL (ref 0–200)
Basophils Relative: 0.3 %
Eosinophils Absolute: 139 cells/uL (ref 15–500)
Eosinophils Relative: 1.9 %
HCT: 43.5 % (ref 38.5–50.0)
Hemoglobin: 14.6 g/dL (ref 13.2–17.1)
Lymphs Abs: 1986 cells/uL (ref 850–3900)
MCH: 28.5 pg (ref 27.0–33.0)
MCHC: 33.6 g/dL (ref 32.0–36.0)
MCV: 85 fL (ref 80.0–100.0)
MPV: 10.9 fL (ref 7.5–12.5)
Monocytes Relative: 6.3 %
Neutro Abs: 4694 cells/uL (ref 1500–7800)
Neutrophils Relative %: 64.3 %
Platelets: 163 10*3/uL (ref 140–400)
RBC: 5.12 10*6/uL (ref 4.20–5.80)
RDW: 14.3 % (ref 11.0–15.0)
Total Lymphocyte: 27.2 %
WBC: 7.3 10*3/uL (ref 3.8–10.8)

## 2022-07-06 LAB — LIPID PANEL
Cholesterol: 87 mg/dL (ref <200)
HDL: 34 mg/dL — ABNORMAL LOW (ref >=40)
LDL Cholesterol (Calc): 28 mg/dL (calc)
Non-HDL Cholesterol (Calc): 53 mg/dL (calc) (ref <130)
Total CHOL/HDL Ratio: 2.6 (calc) (ref <5.0)
Triglycerides: 186 mg/dL — ABNORMAL HIGH (ref <150)

## 2022-07-06 LAB — COMPLETE METABOLIC PANEL WITH GFR
AG Ratio: 1.4 (calc) (ref 1.0–2.5)
ALT: 10 U/L (ref 9–46)
AST: 12 U/L (ref 10–35)
Albumin: 4.3 g/dL (ref 3.6–5.1)
Alkaline phosphatase (APISO): 56 U/L (ref 35–144)
BUN/Creatinine Ratio: 16 (calc) (ref 6–22)
BUN: 30 mg/dL — ABNORMAL HIGH (ref 7–25)
CO2: 28 mmol/L (ref 20–32)
Calcium: 10.1 mg/dL (ref 8.6–10.3)
Chloride: 100 mmol/L (ref 98–110)
Creat: 1.84 mg/dL — ABNORMAL HIGH (ref 0.70–1.35)
Globulin: 3 g/dL (calc) (ref 1.9–3.7)
Glucose, Bld: 217 mg/dL — ABNORMAL HIGH (ref 65–99)
Potassium: 4.2 mmol/L (ref 3.5–5.3)
Sodium: 138 mmol/L (ref 135–146)
Total Bilirubin: 0.5 mg/dL (ref 0.2–1.2)
Total Protein: 7.3 g/dL (ref 6.1–8.1)
eGFR: 39 mL/min/{1.73_m2} — ABNORMAL LOW (ref >=60)

## 2022-07-06 LAB — HEMOGLOBIN A1C
Hgb A1c MFr Bld: 7.3 % of total Hgb — ABNORMAL HIGH (ref <5.7)
Mean Plasma Glucose: 163 mg/dL
eAG (mmol/L): 9 mmol/L

## 2022-07-06 LAB — VITAMIN D 25 HYDROXY (VIT D DEFICIENCY, FRACTURES): Vit D, 25-Hydroxy: 81 ng/mL (ref 30–100)

## 2022-07-07 ENCOUNTER — Telehealth: Payer: Self-pay | Admitting: Gastroenterology

## 2022-07-07 ENCOUNTER — Telehealth: Payer: Self-pay

## 2022-07-07 NOTE — Telephone Encounter (Signed)
Inbound call from patient requesting a call back to discuss scheduling hospital colonoscopy. Please advise.

## 2022-07-07 NOTE — Telephone Encounter (Signed)
The pt called to request an office visit with Dr Lorenso Courier since Dr Ardis Hughs is out of the office at this time.  I have scheduled him for her first available in May.  The pt has been advised of the information and verbalized understanding.

## 2022-07-07 NOTE — Progress Notes (Signed)
Date: 07/07/22               Patient Name: Todd Rice                      DOB: Aug 15, 2055   Review office visits, consults, Medication changes and Chronic conditions for Focus Pharmacist Outreach with CPP on 07/11/22 between 3-5 PM.   Confirm visit date: 07/11/22 between 3-5PM Have you seen any providers since your last visit or any hospitalizations? 03/28/22: ([place], NP)Annual Maintenance Exam. No medication changes. 01/05/22:([place]) Office visit due to Dx HTN, DM2 and Hyperlipidemia. No additional visit notes included.  8/28:23:(Manny,T-Urology)- Office visit due to Dx Acquired Absence of kidney. No additional notes included due to source.  Have there been any changes to your medications since your last visit? (Med. Adherence, care gaps) Unable to obtain Have you had any problems with getting your medications from your pharmacy? (Copay barriers) None What is your top health concern you'd like to discuss for your upcoming visit? None HC: Last Care Plan date: 2022 *Pt needs updated care plan**  Total Time Spent: 69mn

## 2022-07-11 ENCOUNTER — Telehealth: Payer: Medicare Other | Admitting: Pharmacist

## 2022-07-11 DIAGNOSIS — G4733 Obstructive sleep apnea (adult) (pediatric): Secondary | ICD-10-CM | POA: Diagnosis not present

## 2022-07-11 DIAGNOSIS — E1122 Type 2 diabetes mellitus with diabetic chronic kidney disease: Secondary | ICD-10-CM

## 2022-07-11 DIAGNOSIS — Z79899 Other long term (current) drug therapy: Secondary | ICD-10-CM

## 2022-07-11 DIAGNOSIS — I10 Essential (primary) hypertension: Secondary | ICD-10-CM

## 2022-07-11 NOTE — Progress Notes (Unsigned)
Focused Pharmacist Outreach     1. Nephrologist- follow up pushed out to 1 year follow up since he was doing so well post kidney removal. BP's have been normal and at goal < 130/80.   2. Ozempic, Tonya restarted at last visit, 1st injection was 3/4. Tolerating well at this time and tolerated well previously. Educated pt on dehydration, hypoglycemia, and constipation management. Recent BG- 150s FSBG, 80-105 pre-dinner which were in target goal ranges. Encouraged pt to call CPP for any of the above listed adverse effects.     CPP Telemedicine Time: 8 minutes

## 2022-07-17 ENCOUNTER — Encounter: Payer: Self-pay | Admitting: Nurse Practitioner

## 2022-07-17 ENCOUNTER — Ambulatory Visit (INDEPENDENT_AMBULATORY_CARE_PROVIDER_SITE_OTHER): Payer: Medicare Other | Admitting: Nurse Practitioner

## 2022-07-17 VITALS — BP 118/68 | HR 80 | Temp 98.1°F | Ht 65.0 in | Wt 247.6 lb

## 2022-07-17 DIAGNOSIS — C641 Malignant neoplasm of right kidney, except renal pelvis: Secondary | ICD-10-CM | POA: Diagnosis not present

## 2022-07-17 DIAGNOSIS — G20B1 Parkinson's disease with dyskinesia, without mention of fluctuations: Secondary | ICD-10-CM | POA: Diagnosis not present

## 2022-07-17 DIAGNOSIS — I10 Essential (primary) hypertension: Secondary | ICD-10-CM

## 2022-07-17 NOTE — Progress Notes (Signed)
Assessment and Plan:  Todd Rice was seen today for mass.  Diagnoses and all orders for this visit:  Essential hypertension - continue medications, DASH diet, exercise and monitor at home. Call if greater than 130/80.    Primary renal papillary carcinoma, right ([place]) Dr. Tresa Moore following Does not need another appointment until 06/2023 as CT stable  Parkinson's disease with dyskinesia, unspecified whether manifestations fluctuate More noticeable tremors in right hand and pronounced chewing motion at rest noticed today Continue to follow with neurology   Pt thought he was feeling a swollen lymph node on right side of throat- was discovered he was feeling his mandible after examination   Further disposition pending results of labs. Discussed med's effects and SE's.   Over 30 minutes of exam, counseling, chart review, and critical decision making was performed.   Future Appointments  Date Time Provider [place]  09/05/2022  8:30 AM Ward Givens, NP [facility] None  09/19/2022  8:30 AM Sharyn Creamer, MD [facility]  10/06/2022  9:30 AM Darrol Jump, NP [facility] None  01/04/2023 12:00 PM Sharen Counter, CCC-SLP [facility]  01/04/2023 12:15 PM Frazier Butt, PT [facility]  04/02/2023  3:00 PM Cranford, Kenney Houseman, NP [facility] None    ------------------------------------------------------------------------------------------------------------------   HPI BP 118/68   Pulse 80   Temp 98.1 F (36.7 C)   Ht '5\' 5"'$  (1.651 m)   Wt 247 lb 9.6 oz (112.3 kg)   SpO2 96%   BMI 41.20 kg/m   67 y.o.male presents for right side of neck feeling swollen and tender x several days. Feels like he has noted a small mass in  right submandibular area x several weeks.  Does state he is feeling it frequently.    BP is currently well controlled on Diovan HCT 320/25 mg QD.  Denies headaches, chest pain, shortness of breath and dizziness BP Readings from Last 3 Encounters:  07/17/22  118/68  07/05/22 120/74  03/28/22 104/60   Pt continues to be followed by Dr. Tresa Moore for Right Renal cell Carcinoma. Last seen 2/29/23. CT scan was stable and now only has to go once a year.   Follows with neurology [facility] for Parkinson's. He reports that he has been taking the mirapex and sinemet, no med changes recently. Has noted more tremors and now involuntary chewing motions with increased salivation has been noticed. Has appt next month.   Past Medical History:  Diagnosis Date   Adult BMI 50.0-59.9 kg/sq m    52.77   Anal fissure    Cancer ([place])    kidney cancer   Diabetes mellitus without complication ([place])    borderline   Elevated PSA 08/13/2019   H/O: gout    STABLE   Hepatic steatosis 10/01/2011   History of kidney stones    Hyperlipidemia    Hypertension    Kidney tumor    right upper kidney tumor, monitoring   OSA on CPAP    [employer] [facility]   Parkinson disease 01/04/2016   REM sleep behavior disorder 12/06/2016   Right ureteral stone    Ureteral calculus, right 06/12/2011   Vitamin D deficiency    Weakness      Allergies  Allergen Reactions   Ciprofloxacin Hives   Ceftriaxone Hives    Current Outpatient Medications on File Prior to Visit  Medication Sig   albuterol (VENTOLIN HFA) 108 (90 Base) MCG/ACT inhaler INHALE 2 PUFFS EVERY 4 HOURS (15 MINUTES APART) FOR ASTHMA RESCUE   allopurinol (  ZYLOPRIM) 300 MG tablet Take 1 tablet (300 mg total) by mouth daily to prevent gout.   ALPRAZolam (XANAX) 0.5 MG tablet Take 1 tab up to three times a day only if needed for severe anxiety or panic attack. Avoid daily use.   calcium carbonate (TUMS - DOSED IN MG ELEMENTAL CALCIUM) 500 MG chewable tablet Chew 1 tablet by mouth daily as needed for indigestion or heartburn.   carbidopa-levodopa (SINEMET IR) 25-250 MG tablet TAKE 1 TABLET BY MOUTH 3 TIMES DAILY (Patient taking differently: Take 1 tablet by mouth 3 (three) times daily. PT states takes at 0630am, 1230pm  and 630pm at nite.  PT wants to stay on schedule within 30 minutes of these times at all possible.)   Cholecalciferol (VITAMIN D) 50 MCG (2000 UT) tablet Take 2,000 Units by mouth 2 (two) times daily.   ezetimibe (ZETIA) 10 MG tablet Take  1 tablet  Daily  for Cholesterol   fosfomycin (MONUROL) 3 g PACK 1 packet PO Begin today and repeat on Thursday   glipiZIDE (GLUCOTROL) 5 MG tablet Take 1/2 tab as needed for glucose above 150. (Patient taking differently: Take 2.5-5 mg by mouth See admin instructions. Take 2.5 mg as needed for glucose above 150-199, take 5 mg as needed for glucose 200 or higher)   HYDROcodone-acetaminophen (NORCO) 5-325 MG tablet Take 1-2 tablets by mouth every 6 (six) hours as needed for moderate pain or severe pain.   Magnesium 250 MG TABS Take 1 tablet (250 mg total) by mouth 2 (two) times daily with a meal. (Patient taking differently: Take 1 tablet by mouth daily.)   Menthol, Topical Analgesic, (BIOFREEZE EX) Apply 1 Application topically daily as needed (pain).   metFORMIN (GLUCOPHAGE-XR) 500 MG 24 hr tablet TAKE 2 TABLETS BY MOUTH 2 TIMES DAILY   polyethylene glycol powder (GLYCOLAX/MIRALAX) 17 GM/SCOOP powder Take 17 g by mouth every other day. Alternating days with senna   pramipexole (MIRAPEX) 1 MG tablet Take 1 tablet (1 mg total) by mouth 3 (three) times daily.   rosuvastatin (CRESTOR) 20 MG tablet Take 1 tablet (20 mg total) by mouth daily.   Semaglutide,0.25 or 0.'5MG'$ /DOS, (OZEMPIC, 0.25 OR 0.5 MG/DOSE,) 2 MG/3ML SOPN Start by injecting 0.25 mg into skin of stomach once weekly; if doing well increase to 0.5 mg in 4 weeks and continue at this dose.   senna (SENOKOT) 8.6 MG tablet Take 2 tablets by mouth every other day. Alternating days with miralax   sodium chloride (OCEAN) 0.65 % SOLN nasal spray Place 1 spray into both nostrils as needed for congestion.   valsartan-hydrochlorothiazide (DIOVAN-HCT) 320-25 MG tablet Take 1 tablet by mouth daily for blood pressure    COVID-19 mRNA vaccine 2023-2024 (COMIRNATY) SUSP injection Inject into the muscle. (Patient not taking: Reported on 07/17/2022)   influenza vaccine adjuvanted (FLUAD) 0.5 ML injection Inject into the muscle. (Patient not taking: Reported on 07/17/2022)   RSV vaccine recomb adjuvanted (AREXVY) 120 MCG/0.5ML injection Inject into the muscle. (Patient not taking: Reported on 07/17/2022)   Current Facility-Administered Medications on File Prior to Visit  Medication   polyethylene glycol powder (GLYCOLAX/MIRALAX) container 255 g    ROS: all negative except above.   Physical Exam:  BP 118/68   Pulse 80   Temp 98.1 F (36.7 C)   Ht '5\' 5"'$  (1.651 m)   Wt 247 lb 9.6 oz (112.3 kg)   SpO2 96%   BMI 41.20 kg/m   General Appearance: Well nourished, in no apparent distress.  Eyes: PERRLA, EOMs, conjunctiva no swelling or erythema Sinuses: No Frontal/maxillary tenderness ENT/Mouth: Ext aud canals clear, TMs without erythema, bulging. No erythema, swelling, or exudate on post pharynx.  Tonsils not swollen or erythematous. Hearing normal.  Neck: Supple, thyroid normal.  Respiratory: Respiratory effort normal, BS equal bilaterally without rales, rhonchi, wheezing or stridor.  Cardio: RRR with no MRGs. Brisk peripheral pulses without edema.  Abdomen: Soft, + BS.  Non tender, no guarding, rebound, hernias, masses. Lymphatics: Non tender without lymphadenopathy. Pt feeling mandible- no lymphadenopathy Musculoskeletal: Full ROM, 5/5 strength, normal gait.  Skin: Warm, dry without rashes, lesions, ecchymosis.  Neuro: More right hand tremors noted. Noticed chewing motion noted today Psych: Awake and oriented X 3, normal affect, Insight and Judgment appropriate.     Alycia Rossetti, NP 2:46 PM [facility]

## 2022-07-17 NOTE — Patient Instructions (Signed)

## 2022-07-19 ENCOUNTER — Other Ambulatory Visit (HOSPITAL_BASED_OUTPATIENT_CLINIC_OR_DEPARTMENT_OTHER): Payer: Self-pay

## 2022-08-08 ENCOUNTER — Telehealth: Payer: Self-pay

## 2022-08-08 NOTE — Progress Notes (Signed)
Patient: Todd Rice, Balzer DOB: February 22, 2056  Per CPP request, HC calling pt to check in with Ozempic use. PT reports he has not had any constipation, dehydration, or hypoglycemia symptoms. No further concerns at this time. CPP informed.   Total time spent: 62min

## 2022-09-04 NOTE — Progress Notes (Unsigned)
PATIENT: Todd Rice DOB: 06-19-1955  REASON FOR VISIT: follow up HISTORY FROM: patient PRIMARY NEUROLOGIST: Dr. Vickey Huger  Chief Complaint  Patient presents with   Follow-up    Pt in 5 with wife Pt here for CPAP /Parkinson's f/u Pt states CPAP going well Pt states increase drooling on left side of mouth Pt states gait is off  Pt states wants to discuss medication management for Parkinson's  Pt needs refill on medications  Pt states admission for surgery ROBOTIC ASSISTED LAPAROSCOPIC RADICAL NEPHRECTOMY in 12/2021      HISTORY OF PRESENT ILLNESS: Today 09/05/22:  Todd Rice is a 67 y.o. male with a history of OSA on CPAP and Parkinson disease. Returns today for follow-up.  He reports that his CPAP is working well.  He continues to have a fullface beard and the mask leaks but is not bothersome to him.  He is a mouth breather so he does not want to try a nasal mask.   Parkinson's-  feels like medication doesn't last until the next dose. Currently taking sinemet 25-250 mg TID-- 6 AM, 12 PM and 6 PM. Notices trouble with balance. No falls. Tremor in the right hand- comes and goes. No trouble swallowing or chewing foods. Does drool out of the left side.   Vivid dreams have gotten better.     02/08/2022: Todd Rice is a 67 year old male with a history of obstructive sleep apnea on CPAP and Parkinson's disease.  He returns today for follow-up.No changes with gait or balance. Some days are better than others. Has PT regularly. No trouble chewing or swallowing food. Has noticed 2 dreams that are very vivid. Tremor remains in the right arm- stable.  Remains on Sinemet 25-250 mg 3 times a day and Mirapex 1 mg 3 times a day.  He reports that he shaved his beard that his mask is fitting better.  This just occurred in the last 1 to 2 weeks.  Current download still shows a significant leak but some of this data was prior to him shaving his beard  Had cancer and right kidney was removed.      08/04/21: Todd Rice is a 67 year old male with a history of patient seen today for initial BiPAP compliance visit after receiving a new machine.  He was just seen in January for evaluation of Parkinson's.  His BiPAP report is below.  He reports he does feel his mask leaking.  He reports that it may be because of his beard.    06/07/21:   Todd Rice is a 67 year old male with a history of Parkinson's disease.  He returns today for follow-up.  The patient continues to have a tremor in the right arm.  He states it is worse with stress.  Reports that his balance is a little off.  He is going to therapy.  Has an evaluation every 6 months.  His next evaluation is March 6.  Denies any trouble with his swallowing.  Currently has a new CPAP machine.  Reports that he is sleeping on average about 5 hours most nights.  He has an appointment next month for initial CPAP compliance.     REVIEW OF SYSTEMS: Out of a complete 14 system review of symptoms, the patient complains only of the following symptoms, and all other reviewed systems are negative.  FSS 11 ESS1   ALLERGIES: Allergies  Allergen Reactions   Ciprofloxacin Hives   Ceftriaxone Hives    HOME MEDICATIONS: Outpatient Medications Prior  to Visit  Medication Sig Dispense Refill   albuterol (VENTOLIN HFA) 108 (90 Base) MCG/ACT inhaler INHALE 2 PUFFS EVERY 4 HOURS (15 MINUTES APART) FOR ASTHMA RESCUE 6.7 g 1   allopurinol (ZYLOPRIM) 300 MG tablet Take 1 tablet (300 mg total) by mouth daily to prevent gout. 90 tablet 3   ALPRAZolam (XANAX) 0.5 MG tablet Take 1 tab up to three times a day only if needed for severe anxiety or panic attack. Avoid daily use. 90 tablet 0   calcium carbonate (TUMS - DOSED IN MG ELEMENTAL CALCIUM) 500 MG chewable tablet Chew 1 tablet by mouth daily as needed for indigestion or heartburn.     carbidopa-levodopa (SINEMET IR) 25-250 MG tablet TAKE 1 TABLET BY MOUTH 3 TIMES DAILY (Patient taking differently: Take 1  tablet by mouth 3 (three) times daily. PT states takes at 0630am, 1230pm and 630pm at nite.  PT wants to stay on schedule within 30 minutes of these times at all possible.) 270 tablet 3   Cholecalciferol (VITAMIN D) 50 MCG (2000 UT) tablet Take 2,000 Units by mouth 2 (two) times daily.     ezetimibe (ZETIA) 10 MG tablet Take  1 tablet  Daily  for Cholesterol 90 tablet 3   fosfomycin (MONUROL) 3 g PACK 1 packet PO Begin today and repeat on Thursday 2 each 0   glipiZIDE (GLUCOTROL) 5 MG tablet Take 1/2 tab as needed for glucose above 150. (Patient taking differently: Take 2.5-5 mg by mouth See admin instructions. Take 2.5 mg as needed for glucose above 150-199, take 5 mg as needed for glucose 200 or higher) 60 tablet 2   HYDROcodone-acetaminophen (NORCO) 5-325 MG tablet Take 1-2 tablets by mouth every 6 (six) hours as needed for moderate pain or severe pain. 20 tablet 0   Magnesium 250 MG TABS Take 1 tablet (250 mg total) by mouth 2 (two) times daily with a meal. (Patient taking differently: Take 1 tablet by mouth daily.)     Menthol, Topical Analgesic, (BIOFREEZE EX) Apply 1 Application topically daily as needed (pain).     metFORMIN (GLUCOPHAGE-XR) 500 MG 24 hr tablet TAKE 2 TABLETS BY MOUTH 2 TIMES DAILY 360 tablet 3   polyethylene glycol powder (GLYCOLAX/MIRALAX) 17 GM/SCOOP powder Take 17 g by mouth every other day. Alternating days with senna     pramipexole (MIRAPEX) 1 MG tablet Take 1 tablet (1 mg total) by mouth 3 (three) times daily. 270 tablet 3   rosuvastatin (CRESTOR) 20 MG tablet Take 1 tablet (20 mg total) by mouth daily. 90 tablet 3   Semaglutide,0.25 or 0.5MG /DOS, (OZEMPIC, 0.25 OR 0.5 MG/DOSE,) 2 MG/3ML SOPN Start by injecting 0.25 mg into skin of stomach once weekly; if doing well increase to 0.5 mg in 4 weeks and continue at this dose. 3 mL 1   senna (SENOKOT) 8.6 MG tablet Take 2 tablets by mouth every other day. Alternating days with miralax     sodium chloride (OCEAN) 0.65 % SOLN  nasal spray Place 1 spray into both nostrils as needed for congestion.     valsartan-hydrochlorothiazide (DIOVAN-HCT) 320-25 MG tablet Take 1 tablet by mouth daily for blood pressure 90 tablet 3   Facility-Administered Medications Prior to Visit  Medication Dose Route Frequency Provider Last Rate Last Admin   polyethylene glycol powder (GLYCOLAX/MIRALAX) container 255 g  1 Container Oral Once Berneice Heinrich Delbert Phenix., MD        PAST MEDICAL HISTORY: Past Medical History:  Diagnosis Date   Adult  BMI 50.0-59.9 kg/sq m    52.77   Anal fissure    Cancer ([facility])    kidney cancer   Diabetes mellitus without complication ([facility])    borderline   Elevated PSA 08/13/2019   H/O: gout    STABLE   Hepatic steatosis 10/01/2011   History of kidney stones    Hyperlipidemia    Hypertension    Kidney tumor    right upper kidney tumor, monitoring   OSA on CPAP    [employer] [facility]   Parkinson disease 01/04/2016   REM sleep behavior disorder 12/06/2016   Right ureteral stone    Ureteral calculus, right 06/12/2011   Vitamin D deficiency    Weakness     PAST SURGICAL HISTORY: Past Surgical History:  Procedure Laterality Date   ANAL FISSURE REPAIR  10-12-2000   COLONOSCOPY N/A 07/01/2012   Procedure: COLONOSCOPY;  Surgeon: Hart Carwin, MD;  Location: [facility];  Service: Endoscopy;  Laterality: N/A;   CYSTOSCOPY WITH RETROGRADE PYELOGRAM, URETEROSCOPY AND STENT PLACEMENT Bilateral 02/26/2013   Procedure: CYSTOSCOPY WITH RETROGRADE PYELOGRAM, URETEROSCOPY AND STENT PLACEMENT;  Surgeon: Sebastian Ache, MD;  Location: [facility];  Service: Urology;  Laterality: Bilateral;   HOLMIUM LASER APPLICATION Bilateral 02/26/2013   Procedure: HOLMIUM LASER APPLICATION;  Surgeon: Sebastian Ache, MD;  Location: [facility];  Service: Urology;  Laterality: Bilateral;   kidney stone removal     06/2011-stent placement   LEFT MEDIAL FEMORAL CONDYLE DEBRIDEMENT & DRILLING/ REMOVAL LOOSE BODY  09-15-2003   NASAL SEPTUM SURGERY   1985   ROBOT ASSISTED LAPAROSCOPIC NEPHRECTOMY Right 12/16/2021   Procedure: XI ROBOTIC ASSISTED LAPAROSCOPIC RADICAL NEPHRECTOMY;  Surgeon: Sebastian Ache, MD;  Location: [facility];  Service: Urology;  Laterality: Right;  3 HRS   STONE EXTRACTION WITH BASKET  06/12/2011   Procedure: STONE EXTRACTION WITH BASKET;  Surgeon: Garnett Farm, MD;  Location: [facility];  Service: Urology;  Laterality: Right;    FAMILY HISTORY: Family History  Problem Relation Age of Onset   Heart disease Mother        Atrial fibrillation   Hypertension Mother    Hyperlipidemia Mother    Lung cancer Father        was a smoker   Cancer Father        lung   Hyperlipidemia Brother    Melanoma Brother    Heart disease Brother        Amyloid   Heart disease Maternal Aunt    Hyperlipidemia Maternal Aunt    Hypertension Maternal Aunt    Stroke Maternal Aunt    Heart disease Paternal Grandmother    Hyperlipidemia Paternal Grandmother    Parkinson's disease Neg Hx    Sleep apnea Neg Hx     SOCIAL HISTORY: Social History   Socioeconomic History   Marital status: Married    Spouse name: Dawn   Number of children: 0   Years of education: Not on file   Highest education level: Not on file  Occupational History    Comment: retired  Tobacco Use   Smoking status: Former   Smokeless tobacco: Never  Building services engineer Use: Never used  Substance and Sexual Activity   Alcohol use: No   Drug use: No    Comment: QUIT SMOKING " POT" 40 YRS AGO   Sexual activity: Yes    Partners: Female    Birth control/protection: Post-menopausal  Other Topics Concern   Not on file  Social  History Narrative   Right-handed.   No caffeine use.   Lives at home with his wife.   Social Determinants of Health   Financial Resource Strain: Not on file  Food Insecurity: Not on file  Transportation Needs: Not on file  Physical Activity: Not on file  Stress: Not on file  Social Connections: Not on file   Intimate Partner Violence: Not on file      PHYSICAL EXAM  Vitals:   09/05/22 0816  BP: 115/73  Pulse: 69  Weight: 246 lb 9.6 oz (111.9 kg)  Height: 5\' 7"  (1.702 m)   Body mass index is 38.62 kg/m.  Generalized: Well developed, in no acute distress    Neurological examination  Mentation: Alert oriented to time, place, history taking. Follows all commands speech and language fluent Cranial nerve II-XII: Extraocular movements were full, visual field were full on confrontational test Head turning and shoulder shrug  were normal and symmetric.  Involuntary movement noted in the jaw Motor: The motor testing reveals 5 over 5 strength of all 4 extremities. Good symmetric motor tone is noted throughout. Resting tremor noted in right hand. Finger taps- moderate impairment bilaterally, toe taps- moderate impairment bilaterally. Sensory: Sensory testing is intact to soft touch on all 4 extremities. No evidence of extinction is noted.  Gait and station: Able to stand without assistance good stride, slightly decreased arm swing steps with turns.   DIAGNOSTIC DATA (LABS, IMAGING, TESTING) - I reviewed patient records, labs, notes, testing and imaging myself where available.  Lab Results  Component Value Date   WBC 7.3 07/05/2022   HGB 14.6 07/05/2022   HCT 43.5 07/05/2022   MCV 85.0 07/05/2022   PLT 163 07/05/2022      Component Value Date/Time   NA 138 07/05/2022 1015   K 4.2 07/05/2022 1015   CL 100 07/05/2022 1015   CO2 28 07/05/2022 1015   GLUCOSE 217 (H) 07/05/2022 1015   BUN 30 (H) 07/05/2022 1015   CREATININE 1.84 (H) 07/05/2022 1015   CALCIUM 10.1 07/05/2022 1015   PROT 7.3 07/05/2022 1015   ALBUMIN 3.8 10/24/2016 1601   AST 12 07/05/2022 1015   ALT 10 07/05/2022 1015   ALKPHOS 58 10/24/2016 1601   BILITOT 0.5 07/05/2022 1015   GFRNONAA 40 (L) 12/18/2021 0525   GFRNONAA 58 (L) 09/27/2020 1008   GFRAA 68 09/27/2020 1008   Lab Results  Component Value Date    CHOL 87 07/05/2022   HDL 34 (L) 07/05/2022   LDLCALC 28 07/05/2022   TRIG 186 (H) 07/05/2022   CHOLHDL 2.6 07/05/2022   Lab Results  Component Value Date   HGBA1C 7.3 (H) 07/05/2022   Lab Results  Component Value Date   VITAMINB12 396 03/28/2022   Lab Results  Component Value Date   TSH 2.60 09/20/2021      ASSESSMENT AND PLAN 67 y.o. year old male  has a past medical history of Adult BMI 50.0-59.9 kg/sq m, Anal fissure, Cancer ([facility]), Diabetes mellitus without complication ([facility]), Elevated PSA (08/13/2019), H/O: gout, Hepatic steatosis (10/01/2011), History of kidney stones, Hyperlipidemia, Hypertension, Kidney tumor, OSA on CPAP, Parkinson disease (01/04/2016), REM sleep behavior disorder (12/06/2016), Right ureteral stone, Ureteral calculus, right (06/12/2011), Vitamin D deficiency, and Weakness. here with:  OSA on BiPAP  - CPAP compliance excellent - Residual AHI remains elevated.  Most likely due to leakage since he has a fullface beard. - Encourage patient to use CPAP nightly and > 4 hours each night  2.  Parkinson's disease  -Increase Sinemet 25-250 mg 1 tablet 4 times a day spaced out by 4 hours.  Did caution the patient about dyskinesia with too much medication -Continue Mirapex 1 mg 3 times a day  - F/U in 6 months with Dr. Vickey Huger or sooner if needed    Butch Penny, MSN, NP-C 09/05/2022, 8:40 AM [facility] [address] [place], [place] [address] [phone]

## 2022-09-05 ENCOUNTER — Other Ambulatory Visit: Payer: Self-pay

## 2022-09-05 ENCOUNTER — Other Ambulatory Visit (HOSPITAL_BASED_OUTPATIENT_CLINIC_OR_DEPARTMENT_OTHER): Payer: Self-pay

## 2022-09-05 ENCOUNTER — Other Ambulatory Visit: Payer: Self-pay | Admitting: Adult Health

## 2022-09-05 ENCOUNTER — Encounter: Payer: Self-pay | Admitting: Adult Health

## 2022-09-05 ENCOUNTER — Ambulatory Visit: Payer: Medicare Other | Admitting: Adult Health

## 2022-09-05 VITALS — BP 115/73 | HR 69 | Ht 67.0 in | Wt 246.6 lb

## 2022-09-05 DIAGNOSIS — G20A1 Parkinson's disease without dyskinesia, without mention of fluctuations: Secondary | ICD-10-CM

## 2022-09-05 DIAGNOSIS — G4733 Obstructive sleep apnea (adult) (pediatric): Secondary | ICD-10-CM | POA: Diagnosis not present

## 2022-09-05 DIAGNOSIS — E785 Hyperlipidemia, unspecified: Secondary | ICD-10-CM

## 2022-09-05 MED ORDER — PRAMIPEXOLE DIHYDROCHLORIDE 1 MG PO TABS
1.0000 mg | ORAL_TABLET | Freq: Three times a day (TID) | ORAL | 3 refills | Status: DC
  Filled 2022-09-05: qty 270, 90d supply, fill #0
  Filled 2022-12-25: qty 270, 90d supply, fill #1

## 2022-09-05 MED ORDER — CARBIDOPA-LEVODOPA 25-250 MG PO TABS
1.0000 | ORAL_TABLET | Freq: Four times a day (QID) | ORAL | 3 refills | Status: DC
  Filled 2022-09-05: qty 130, 32d supply, fill #0
  Filled 2022-09-05: qty 230, 58d supply, fill #0

## 2022-09-05 MED ORDER — ROSUVASTATIN CALCIUM 20 MG PO TABS
20.0000 mg | ORAL_TABLET | Freq: Every day | ORAL | 3 refills | Status: DC
  Filled 2022-09-05: qty 90, 90d supply, fill #0
  Filled 2022-11-14: qty 90, 90d supply, fill #1
  Filled 2023-01-24: qty 90, 90d supply, fill #2
  Filled 2023-04-12: qty 90, 90d supply, fill #3

## 2022-09-05 NOTE — Patient Instructions (Signed)
Your Plan:  Increase Sinemet 25-250 mg four times a day- spaced out by 4 hours Continue Mirapex 1 mg three times a day Stay hydrated       Thank you for coming to see [place] at [facility]. I hope we have been able to provide you high quality care today.  You may receive a patient satisfaction survey over the next few weeks. We would appreciate your feedback and comments so that we may continue to improve ourselves and the health of our patients.

## 2022-09-06 ENCOUNTER — Other Ambulatory Visit (HOSPITAL_BASED_OUTPATIENT_CLINIC_OR_DEPARTMENT_OTHER): Payer: Self-pay

## 2022-09-19 ENCOUNTER — Encounter: Payer: Self-pay | Admitting: Internal Medicine

## 2022-09-19 ENCOUNTER — Ambulatory Visit: Payer: Medicare Other | Admitting: Internal Medicine

## 2022-09-19 VITALS — BP 130/78 | HR 68 | Ht 67.0 in | Wt 244.4 lb

## 2022-09-19 DIAGNOSIS — K5900 Constipation, unspecified: Secondary | ICD-10-CM | POA: Diagnosis not present

## 2022-09-19 DIAGNOSIS — Z1211 Encounter for screening for malignant neoplasm of colon: Secondary | ICD-10-CM | POA: Diagnosis not present

## 2022-09-19 NOTE — Progress Notes (Signed)
Chief Complaint: Colon cancer screening  HPI : 67 year old with history of DM, Parksinson's, gout, OSA, obesity, RCC s/p right nephrectomy presents to discuss colon cancer screening.  Denies blood in stools, changes in bowel habits, or unintentional weight loss. Denies abdominal pain, N&V, dysphagia, or GERD. Due to his Parkinson's disease, he has constipation for which he has to take senna and Miralax every day. He may see a scant amount of blood on the stool at times, though not recently. He has had a history of anal fissure that was repaired surgically. Denies family history of colon cancer. Denies use of blood thinners. He last had a colonoscopy 10 years ago.   Past Medical History:  Diagnosis Date   Adult BMI 50.0-59.9 kg/sq m    52.77   Anal fissure    Cancer (HCC)    kidney cancer   Diabetes mellitus without complication (HCC)    borderline   Elevated PSA 08/13/2019   H/O: gout    STABLE   Hepatic steatosis 10/01/2011   History of kidney stones    Hyperlipidemia    Hypertension    Kidney tumor    right upper kidney tumor, monitoring   OSA on CPAP    [employer] [facility]   Parkinson disease 01/04/2016   REM sleep behavior disorder 12/06/2016   Right ureteral stone    Ureteral calculus, right 06/12/2011   Vitamin D deficiency    Weakness      Past Surgical History:  Procedure Laterality Date   ANAL FISSURE REPAIR  10-12-2000   COLONOSCOPY N/A 07/01/2012   Procedure: COLONOSCOPY;  Surgeon: Hart Carwin, MD;  Location: [facility];  Service: Endoscopy;  Laterality: N/A;   CYSTOSCOPY WITH RETROGRADE PYELOGRAM, URETEROSCOPY AND STENT PLACEMENT Bilateral 02/26/2013   Procedure: CYSTOSCOPY WITH RETROGRADE PYELOGRAM, URETEROSCOPY AND STENT PLACEMENT;  Surgeon: Sebastian Ache, MD;  Location: [facility];  Service: Urology;  Laterality: Bilateral;   HOLMIUM LASER APPLICATION Bilateral 02/26/2013   Procedure: HOLMIUM LASER APPLICATION;  Surgeon: Sebastian Ache, MD;  Location: [facility];   Service: Urology;  Laterality: Bilateral;   kidney stone removal     06/2011-stent placement   LEFT MEDIAL FEMORAL CONDYLE DEBRIDEMENT & DRILLING/ REMOVAL LOOSE BODY  09-15-2003   NASAL SEPTUM SURGERY  1985   ROBOT ASSISTED LAPAROSCOPIC NEPHRECTOMY Right 12/16/2021   Procedure: XI ROBOTIC ASSISTED LAPAROSCOPIC RADICAL NEPHRECTOMY;  Surgeon: Sebastian Ache, MD;  Location: [facility];  Service: Urology;  Laterality: Right;  3 HRS   STONE EXTRACTION WITH BASKET  06/12/2011   Procedure: STONE EXTRACTION WITH BASKET;  Surgeon: Garnett Farm, MD;  Location: [facility];  Service: Urology;  Laterality: Right;   Family History  Problem Relation Age of Onset   Heart disease Mother        Atrial fibrillation   Hypertension Mother    Hyperlipidemia Mother    Lung cancer Father        was a smoker   Cancer Father        lung   Hyperlipidemia Brother    Melanoma Brother    Heart disease Brother        Amyloid   Heart disease Maternal Aunt    Hyperlipidemia Maternal Aunt    Hypertension Maternal Aunt    Stroke Maternal Aunt    Heart disease Paternal Grandmother    Hyperlipidemia Paternal Grandmother    Parkinson's disease Neg Hx    Sleep apnea Neg Hx    Social History  Tobacco Use   Smoking status: Former   Smokeless tobacco: Never  Building services engineer Use: Never used  Substance Use Topics   Alcohol use: No   Drug use: No    Comment: QUIT SMOKING " POT" 40 YRS AGO   Current Outpatient Medications  Medication Sig Dispense Refill   albuterol (VENTOLIN HFA) 108 (90 Base) MCG/ACT inhaler INHALE 2 PUFFS EVERY 4 HOURS (15 MINUTES APART) FOR ASTHMA RESCUE 6.7 g 1   allopurinol (ZYLOPRIM) 300 MG tablet Take 1 tablet (300 mg total) by mouth daily to prevent gout. 90 tablet 3   ALPRAZolam (XANAX) 0.5 MG tablet Take 1 tab up to three times a day only if needed for severe anxiety or panic attack. Avoid daily use. 90 tablet 0   calcium carbonate (TUMS - DOSED IN MG ELEMENTAL CALCIUM)  500 MG chewable tablet Chew 1 tablet by mouth daily as needed for indigestion or heartburn.     carbidopa-levodopa (SINEMET IR) 25-250 MG tablet Take 1 tablet by mouth 4 (four) times daily. 360 tablet 3   Cholecalciferol (VITAMIN D) 50 MCG (2000 UT) tablet Take 2,000 Units by mouth 2 (two) times daily.     ezetimibe (ZETIA) 10 MG tablet Take  1 tablet  Daily  for Cholesterol 90 tablet 3   fosfomycin (MONUROL) 3 g PACK 1 packet PO Begin today and repeat on Thursday 2 each 0   glipiZIDE (GLUCOTROL) 5 MG tablet Take 1/2 tab as needed for glucose above 150. (Patient taking differently: Take 2.5-5 mg by mouth See admin instructions. Take 2.5 mg as needed for glucose above 150-199, take 5 mg as needed for glucose 200 or higher) 60 tablet 2   HYDROcodone-acetaminophen (NORCO) 5-325 MG tablet Take 1-2 tablets by mouth every 6 (six) hours as needed for moderate pain or severe pain. 20 tablet 0   Magnesium 250 MG TABS Take 1 tablet (250 mg total) by mouth 2 (two) times daily with a meal. (Patient taking differently: Take 1 tablet by mouth daily.)     Menthol, Topical Analgesic, (BIOFREEZE EX) Apply 1 Application topically daily as needed (pain).     metFORMIN (GLUCOPHAGE-XR) 500 MG 24 hr tablet TAKE 2 TABLETS BY MOUTH 2 TIMES DAILY 360 tablet 3   polyethylene glycol powder (GLYCOLAX/MIRALAX) 17 GM/SCOOP powder Take 17 g by mouth every other day. Alternating days with senna     pramipexole (MIRAPEX) 1 MG tablet Take 1 tablet (1 mg total) by mouth 3 (three) times daily. 270 tablet 3   rosuvastatin (CRESTOR) 20 MG tablet Take 1 tablet (20 mg total) by mouth daily. 90 tablet 3   Semaglutide,0.25 or 0.5MG /DOS, (OZEMPIC, 0.25 OR 0.5 MG/DOSE,) 2 MG/3ML SOPN Start by injecting 0.25 mg into skin of stomach once weekly; if doing well increase to 0.5 mg in 4 weeks and continue at this dose. 3 mL 1   senna (SENOKOT) 8.6 MG tablet Take 2 tablets by mouth every other day. Alternating days with miralax     sodium chloride  (OCEAN) 0.65 % SOLN nasal spray Place 1 spray into both nostrils as needed for congestion.     valsartan-hydrochlorothiazide (DIOVAN-HCT) 320-25 MG tablet Take 1 tablet by mouth daily for blood pressure 90 tablet 3   No current facility-administered medications for this visit.   Facility-Administered Medications Ordered in Other Visits  Medication Dose Route Frequency Provider Last Rate Last Admin   polyethylene glycol powder (GLYCOLAX/MIRALAX) container 255 g  1 Container Oral Once Sebastian Ache  Malcolm Metro., MD       Allergies  Allergen Reactions   Ciprofloxacin Hives   Ceftriaxone Hives     Review of Systems: All systems reviewed and negative except where noted in HPI.   Physical Exam: BP 130/78   Pulse 68   Ht 5\' 7"  (1.702 m)   Wt 244 lb 6.4 oz (110.9 kg)   SpO2 96%   BMI 38.28 kg/m  Constitutional: Pleasant,well-developed, male in no acute distress. HEENT: Normocephalic and atraumatic. Conjunctivae are normal. No scleral icterus. Cardiovascular: Normal rate, regular rhythm.  Pulmonary/chest: Effort normal and breath sounds normal. No wheezing, rales or rhonchi. Abdominal: Soft, nondistended, nontender. Bowel sounds active throughout. There are no masses palpable. No hepatomegaly. Extremities: No edema Neurological: Alert and oriented to person place and time. Skin: Skin is warm and dry. No rashes noted. Psychiatric: Normal mood and affect. Behavior is normal.  Labs 06/2022: CMP with elevated Cr of 1.84 and elevated glucose of 217. CBC unremarkable.  HbA1C 7.3%.   ASSESSMENT AND PLAN: Colon cancer screening Constipation Patient presents to discuss colon cancer screening. His last colonoscopy was 10 years ago that had no polyps. Patient does have some baseline constipation so will have him do Miralax BID 7 days before his procedure. He previously had a pancreatic cyst that has been stable and thus needs no further imaging. - Colonoscopy [facility]. Miralax prep. Recommend Miralax BID  for 7 days beforehand.  Eulah Pont, MD  I spent 32 minutes of time, including in depth chart review, independent review of results as outlined above, communicating results with the patient directly, face-to-face time with the patient, coordinating care, and ordering studies and medications as appropriate, and documentation.

## 2022-09-19 NOTE — Patient Instructions (Addendum)
You have been scheduled for a colonoscopy. Please follow written instructions given to you at your visit today.  Please pick up your prep supplies at the pharmacy within the next 1-3 days. If you use inhalers (even only as needed), please bring them with you on the day of your procedure.   Start Miralax 2 times daily 7 days before procedure  _______________________________________________________  If your blood pressure at your visit was 140/90 or greater, please contact your primary care physician to follow up on this.  _______________________________________________________  If you are age 54 or older, your body mass index should be between 23-30. Your Body mass index is 38.28 kg/m. If this is out of the aforementioned range listed, please consider follow up with your Primary Care Provider.  If you are age 47 or younger, your body mass index should be between 19-25. Your Body mass index is 38.28 kg/m. If this is out of the aformentioned range listed, please consider follow up with your Primary Care Provider.   ________________________________________________________  The [facility] providers would like to encourage you to use [facility] to communicate with providers for non-urgent requests or questions.  Due to long hold times on the telephone, sending your provider a message by [facility] may be a faster and more efficient way to get a response.  Please allow 48 business hours for a response.  Please remember that this is for non-urgent requests.  _______________________________________________________   Due to recent changes in healthcare laws, you may see the results of your imaging and laboratory studies on MyChart before your provider has had a chance to review them.  We understand that in some cases there may be results that are confusing or concerning to you. Not all laboratory results come back in the same time frame and the provider may be waiting for multiple results in order to  interpret others.  Please give [place] 48 hours in order for your provider to thoroughly review all the results before contacting the office for clarification of your results.    Thank you for entrusting me with your care and for choosing [facility], Dr. Eulah Pont   .

## 2022-09-21 ENCOUNTER — Other Ambulatory Visit (HOSPITAL_BASED_OUTPATIENT_CLINIC_OR_DEPARTMENT_OTHER): Payer: Self-pay

## 2022-09-21 ENCOUNTER — Ambulatory Visit: Payer: Medicare Other | Admitting: Nurse Practitioner

## 2022-09-21 ENCOUNTER — Other Ambulatory Visit: Payer: Self-pay | Admitting: Nurse Practitioner

## 2022-09-21 DIAGNOSIS — E119 Type 2 diabetes mellitus without complications: Secondary | ICD-10-CM

## 2022-09-21 DIAGNOSIS — N1831 Chronic kidney disease, stage 3a: Secondary | ICD-10-CM

## 2022-09-21 MED ORDER — METFORMIN HCL ER 500 MG PO TB24
1000.0000 mg | ORAL_TABLET | Freq: Two times a day (BID) | ORAL | 3 refills | Status: DC
  Filled 2022-09-21: qty 360, 90d supply, fill #0
  Filled 2022-12-25: qty 360, 90d supply, fill #1
  Filled 2023-03-21: qty 360, 90d supply, fill #2
  Filled 2023-06-19: qty 360, 90d supply, fill #3

## 2022-09-21 MED ORDER — OZEMPIC (0.25 OR 0.5 MG/DOSE) 2 MG/3ML ~~LOC~~ SOPN
PEN_INJECTOR | SUBCUTANEOUS | 1 refills | Status: DC
Start: 2022-09-21 — End: 2023-01-11
  Filled 2022-09-21: qty 3, 28d supply, fill #0
  Filled 2022-11-14: qty 3, 28d supply, fill #1

## 2022-10-06 ENCOUNTER — Ambulatory Visit (INDEPENDENT_AMBULATORY_CARE_PROVIDER_SITE_OTHER): Payer: Medicare Other | Admitting: Nurse Practitioner

## 2022-10-06 ENCOUNTER — Encounter: Payer: Self-pay | Admitting: Nurse Practitioner

## 2022-10-06 VITALS — BP 108/66 | HR 80 | Temp 97.8°F | Ht 67.0 in | Wt 244.0 lb

## 2022-10-06 DIAGNOSIS — G4733 Obstructive sleep apnea (adult) (pediatric): Secondary | ICD-10-CM | POA: Diagnosis not present

## 2022-10-06 DIAGNOSIS — Z0000 Encounter for general adult medical examination without abnormal findings: Secondary | ICD-10-CM

## 2022-10-06 DIAGNOSIS — E1122 Type 2 diabetes mellitus with diabetic chronic kidney disease: Secondary | ICD-10-CM

## 2022-10-06 DIAGNOSIS — N1831 Chronic kidney disease, stage 3a: Secondary | ICD-10-CM

## 2022-10-06 DIAGNOSIS — E785 Hyperlipidemia, unspecified: Secondary | ICD-10-CM

## 2022-10-06 DIAGNOSIS — I2510 Atherosclerotic heart disease of native coronary artery without angina pectoris: Secondary | ICD-10-CM | POA: Diagnosis not present

## 2022-10-06 DIAGNOSIS — Z0001 Encounter for general adult medical examination with abnormal findings: Secondary | ICD-10-CM

## 2022-10-06 DIAGNOSIS — J4520 Mild intermittent asthma, uncomplicated: Secondary | ICD-10-CM

## 2022-10-06 DIAGNOSIS — N183 Chronic kidney disease, stage 3 unspecified: Secondary | ICD-10-CM

## 2022-10-06 DIAGNOSIS — J3089 Other allergic rhinitis: Secondary | ICD-10-CM

## 2022-10-06 DIAGNOSIS — K5900 Constipation, unspecified: Secondary | ICD-10-CM

## 2022-10-06 DIAGNOSIS — I10 Essential (primary) hypertension: Secondary | ICD-10-CM

## 2022-10-06 DIAGNOSIS — I700 Atherosclerosis of aorta: Secondary | ICD-10-CM

## 2022-10-06 DIAGNOSIS — R6889 Other general symptoms and signs: Secondary | ICD-10-CM

## 2022-10-06 DIAGNOSIS — G20A1 Parkinson's disease without dyskinesia, without mention of fluctuations: Secondary | ICD-10-CM | POA: Diagnosis not present

## 2022-10-06 DIAGNOSIS — J302 Other seasonal allergic rhinitis: Secondary | ICD-10-CM

## 2022-10-06 DIAGNOSIS — E1169 Type 2 diabetes mellitus with other specified complication: Secondary | ICD-10-CM | POA: Diagnosis not present

## 2022-10-06 DIAGNOSIS — E559 Vitamin D deficiency, unspecified: Secondary | ICD-10-CM | POA: Diagnosis not present

## 2022-10-06 DIAGNOSIS — F325 Major depressive disorder, single episode, in full remission: Secondary | ICD-10-CM

## 2022-10-06 DIAGNOSIS — Z79899 Other long term (current) drug therapy: Secondary | ICD-10-CM

## 2022-10-06 DIAGNOSIS — Z6841 Body Mass Index (BMI) 40.0 and over, adult: Secondary | ICD-10-CM

## 2022-10-06 LAB — CBC WITH DIFFERENTIAL/PLATELET
HCT: 42.2 % (ref 38.5–50.0)
MCV: 85.8 fL (ref 80.0–100.0)
MPV: 10.9 fL (ref 7.5–12.5)
Platelets: 163 10*3/uL (ref 140–400)

## 2022-10-06 NOTE — Progress Notes (Signed)
ANNUAL WELLNESS VISIT AND FOLLOW UP  Assessment and Plan:  Annual Medicare Wellness Visit Due annually  Health maintenance reviewed  Atherosclerosis of aorta (HCC) - per CXR 06/2018 Control blood pressure, cholesterol, glucose, increase exercise.   Coronary artery disease involving native coronary artery of native heart without angina pectoris Control blood pressure, cholesterol, glucose, increase exercise.  Go to the ER if any chest pain, shortness of breath, nausea, dizziness, severe HA, changes vision/speech  Essential hypertension Discussed DASH (Dietary Approaches to Stop Hypertension) DASH diet is lower in sodium than a typical American diet. Cut back on foods that are high in saturated fat, cholesterol, and trans fats. Eat more whole-grain foods, fish, poultry, and nuts Remain active and exercise as tolerated daily.  Monitor BP at home-Call if greater than 130/80.  Check CMP/CBC  Controlled type 2 diabetes mellitus with diabetic dermatitis, without long-term current use of insulin [facility]) Education: Reviewed 'ABCs' of diabetes management  Discussed goals to be met and/or maintained include A1C (<7) Blood pressure (<130/80) Cholesterol (LDL <70) Continue Eye Exam yearly  Continue Dental Exam Q6 mo Discussed dietary recommendations Discussed Physical Activity recommendations Check A1C  Hyperlipidemia associated with T2DM (HCC) Discussed lifestyle modifications. Recommended diet heavy in fruits and veggies, omega 3's. Decrease consumption of animal meats, cheeses, and dairy products. Remain active and exercise as tolerated. Continue to monitor. Check lipids/TSH  CKD III associated with T2DM (HCC) Discussed how what you eat and drink can aide in kidney protection. Stay well hydrated. Avoid high salt foods. Avoid NSAIDS. Keep BP and BG well controlled.   Take medications as prescribed. Remain active and exercise as tolerated daily. Maintain weight.  Continue to  monitor. Check CMP/GFR/Microablumin  Class 3 obesity due to excess calories with serious comorbidity and body mass index (BMI) of 40.0 to 44.9 in adult [facility]  Discussed appropriate BMI Diet modification. Physical activity. Encouraged/praised to build confidence.  Parkinson disease [facility]) Continue neuro  New referral to Dr. Arbutus Leas, Dr. Anne Hahn retired PT routinely as needed with benefit; doing exercises at home;   Medication management All medications discussed and reviewed in full. All questions and concerns regarding medications addressed.    Vitamin D deficiency Continue supplement for goal of 60-100 Monitor Vitamin D levels  Seasonal and perennial allergic rhinitis Allegra OTC, increase H20, allergy hygiene explained. Avoid triggers  Asthma, mild intermittent, well-controlled Weight loss, PRN albuterol, avoid triggers  Right renal cell carcinoma (HCC) Dr. Berneice Heinrich following, active monitoring  Pseudomonas aeruginosa colonization Monitor, urology following Recently est with ID Dr. Daiva Eves for IV treatment if symptomatic   OSA treated with BiPAP Dr. Vickey Huger following- continue BiPAP, helping with daytime fatigue, weight loss still advised.   REM sleep behavior disorder Continue neuro follow up  Major depression in remission (HCC) In remission off of medication; monitor   Constipation Well managed alternating miralax and senokot Increase frequency if needed  Orders Placed This Encounter  Procedures   CBC with Differential/Platelet   COMPLETE METABOLIC PANEL WITH GFR   Lipid panel   Hemoglobin A1c   VITAMIN D 25 Hydroxy (Vit-D Deficiency, Fractures)    Notify office for further evaluation and treatment, questions or concerns if any reported s/s fail to improve.   The patient was advised to call back or seek an in-person evaluation if any symptoms worsen or if the condition fails to improve as anticipated.   Further disposition pending results of labs. Discussed  med's effects and SE's.    I discussed the assessment and  treatment plan with the patient. The patient was provided an opportunity to ask questions and all were answered. The patient agreed with the plan and demonstrated an understanding of the instructions.  Discussed med's effects and SE's. Screening labs and tests as requested with regular follow-up as recommended.  I provided 35 minutes of face-to-face time during this encounter including counseling, chart review, and critical decision making was preformed.  Today's Plan of Care is based on a patient-centered health care approach known as shared decision making - the decisions, tests and treatments allow for patient preferences and values to be balanced with clinical evidence.     Future Appointments  Date Time Provider Department Center  10/30/2022  2:30 PM Suanne Marker, MD [facility] None  11/01/2022  9:00 AM Imogene Burn, MD [facility]  01/04/2023 12:00 PM Nona Dell [facility]  01/04/2023 12:15 PM Gean Maidens, PT [facility]  04/02/2023  3:00 PM Adela Glimpse, NP [facility] None     Plan:   During the course of the visit the patient was educated and counseled about appropriate screening and preventive services including:   Pneumococcal vaccine  Prevnar 13 Influenza vaccine Td vaccine Screening electrocardiogram Bone densitometry screening Colorectal cancer screening Diabetes screening Glaucoma screening Nutrition counseling  Advanced directives: requested    HPI 67 y.o. male patient presents alongside wife for AWV and 3 month follow up. He has CAD (coronary artery disease); Essential hypertension; Morbid obesity with BMI of 40.0-44.9, adult (HCC); Pseudomonas aeruginosa colonization; Seasonal and perennial allergic rhinitis; Asthma, mild intermittent, well-controlled; Parkinson's disease; CKD stage 3 due to type 2 diabetes mellitus (HCC); Medication management; Vitamin D deficiency;  REM sleep behavior disorder; OSA treated with BiPAP; Type 2 diabetes mellitus with stage 3a chronic kidney disease, without long-term current use of insulin (HCC); Aortic atherosclerosis (HCC) by CXR on 06/10/2018; Hyperlipidemia associated with type 2 diabetes mellitus (HCC); Central sleep apnea due to Cheyne-Stokes respiration; Major depression in remission (HCC); Primary renal papillary carcinoma, right (HCC); Hepatic steatosis; Gout; Constipation; and Renal mass on their problem list.  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 Accompanied by his wife.   Overall he reports feeling well today.  He has some increase in drooling secondary to Parkinson's, discussing Botox for treatment.  His Sinemet has been increased to QID, continues Mirapex.  Follows with neurology Dr. Arbutus Leas for Parkinson's. Does continue to have significant L hand tremor and sometimes right.  Just finished up with PT earlier this month, now working with OT twice a week.   He has depression in remission off of medication (wellbutrin). He has xanax that he uses occasionally, typically 2-3 tabs per month. Rare use of ambien for sleep (prescribed by Dr. Vickey Huger).   He is on BiPAP for OSA/central sleep apnea per Dr. Vickey Huger, endorses 100% compliance and restorative sleep.   Also has R renal papillary carcinoma, Follows annually with Dr. Berneice Heinrich, has been stable for several years. He has hx of chronic pseudomonas bladder colonization, now est with Dr. Daiva Eves so if recurrent UTI sx planning on IV abx.   He is on miralax and senokot once a day for constipation and reports good results.   Has mild asthma, has albuterol uses q2-3 days PRN if needed when outdoors and ragweed season.    BMI is Body mass index is 38.22 kg/m., he has been working on , diet and exercise, strength exercises and walking several times a week.  Wt Readings from Last 3  Encounters:  10/06/22 244 lb (110.7 kg)  09/19/22 244 lb 6.4 oz (110.9 kg)  09/05/22 246 lb 9.6 oz  (111.9 kg)   Aortic atherosclerosis per CXR 2020. Normal stress test 2013 by Dr. Antoine Poche due to CAD per CT.  His blood pressure has been controlled at home, today their BP is BP: 108/66  He does workout. He denies chest pain, shortness of breath, dizziness.  He is on cholesterol medication (zetia 10 mg daily, rosuvastatin 20 mg daily, didn't tolerate 40 mg daily) and denies myalgias. His cholesterol is not at goal of 70 or less. The cholesterol last visit was:   Lab Results  Component Value Date   CHOL 87 07/05/2022   HDL 34 (L) 07/05/2022   LDLCALC 28 07/05/2022   TRIG 186 (H) 07/05/2022   CHOLHDL 2.6 07/05/2022   He has been working on diet and exercise for diabetes CKD, he is on ARB Hyperlipemia on crestor Aortic atherosclerosis he is on bASA he is on metformin 4 a day  Only takes glipizide if his sugar is 150+ in AM takes 1/2, 200+ takes whole if 200+ His sugars have been running 150s, rare up to 175 Was improved with ozempic 0.25 mg weekly (sample) - denies SE denies foot ulcerations, hyperglycemia, hypoglycemia , increased appetite, nausea, paresthesia of the feet, polydipsia, polyuria, visual disturbances, vomiting and weight loss.  Last A1C in the office was:  Lab Results  Component Value Date   HGBA1C 7.3 (H) 07/05/2022   He has CKD III associated with T2DM monitored at this office, was slightly lower last check:  Lab Results  Component Value Date   EGFR 39 (L) 07/05/2022   Patient is on Vitamin D supplement.   Lab Results  Component Value Date   VD25OH 81 07/05/2022   Hx of PSA elevation, follows with Dr. Berneice Heinrich. Denies LUTS.  Lab Results  Component Value Date   PSA 2.85 03/28/2022      Current Medications:   Current Outpatient Medications (Endocrine & Metabolic):    glipiZIDE (GLUCOTROL) 5 MG tablet, Take 1/2 tab as needed for glucose above 150. (Patient taking differently: Take 2.5-5 mg by mouth See admin instructions. Take 2.5 mg as needed for glucose  above 150-199, take 5 mg as needed for glucose 200 or higher)   metFORMIN (GLUCOPHAGE-XR) 500 MG 24 hr tablet, Take 2 tablets (1,000 mg total) by mouth 2 (two) times daily.   Semaglutide,0.25 or 0.5MG /DOS, (OZEMPIC, 0.25 OR 0.5 MG/DOSE,) 2 MG/3ML SOPN, Start by injecting 0.25 mg into skin of stomach once weekly; if doing well increase to 0.5 mg in 4 weeks and continue at this dose.   Current Outpatient Medications (Cardiovascular):    ezetimibe (ZETIA) 10 MG tablet, Take  1 tablet  Daily  for Cholesterol   rosuvastatin (CRESTOR) 20 MG tablet, Take 1 tablet (20 mg total) by mouth daily.   valsartan-hydrochlorothiazide (DIOVAN-HCT) 320-25 MG tablet, Take 1 tablet by mouth daily for blood pressure   Current Outpatient Medications (Respiratory):    albuterol (VENTOLIN HFA) 108 (90 Base) MCG/ACT inhaler, INHALE 2 PUFFS EVERY 4 HOURS (15 MINUTES APART) FOR ASTHMA RESCUE   sodium chloride (OCEAN) 0.65 % SOLN nasal spray, Place 1 spray into both nostrils as needed for congestion.   Current Outpatient Medications (Analgesics):    allopurinol (ZYLOPRIM) 300 MG tablet, Take 1 tablet (300 mg total) by mouth daily to prevent gout.   HYDROcodone-acetaminophen (NORCO) 5-325 MG tablet, Take 1-2 tablets by mouth every 6 (six) hours  as needed for moderate pain or severe pain.     Current Outpatient Medications (Other):    ALPRAZolam (XANAX) 0.5 MG tablet, Take 1 tab up to three times a day only if needed for severe anxiety or panic attack. Avoid daily use.   calcium carbonate (TUMS - DOSED IN MG ELEMENTAL CALCIUM) 500 MG chewable tablet, Chew 1 tablet by mouth daily as needed for indigestion or heartburn.   carbidopa-levodopa (SINEMET IR) 25-250 MG tablet, Take 1 tablet by mouth 4 (four) times daily.   Cholecalciferol (VITAMIN D) 50 MCG (2000 UT) tablet, Take 2,000 Units by mouth 2 (two) times daily.   fosfomycin (MONUROL) 3 g PACK, 1 packet PO Begin today and repeat on Thursday   Magnesium 250 MG TABS,  Take 1 tablet (250 mg total) by mouth 2 (two) times daily with a meal. (Patient taking differently: Take 1 tablet by mouth daily.)   Menthol, Topical Analgesic, (BIOFREEZE EX), Apply 1 Application topically daily as needed (pain).   polyethylene glycol powder (GLYCOLAX/MIRALAX) 17 GM/SCOOP powder, Take 17 g by mouth every other day. Alternating days with senna   pramipexole (MIRAPEX) 1 MG tablet, Take 1 tablet (1 mg total) by mouth 3 (three) times daily.   senna (SENOKOT) 8.6 MG tablet, Take 2 tablets by mouth every other day. Alternating days with miralax   Facility-Administered Medications Ordered in Other Visits (Other):    polyethylene glycol powder (GLYCOLAX/MIRALAX) container 255 g No current facility-administered medications for this visit.  Health Maintenance:  Immunization History  Administered Date(s) Administered   COVID-19, mRNA, vaccine(Comirnaty)12 years and older 02/15/2022   Influenza Inj Mdck Quad With Preservative 02/22/2017, 01/21/2018, 02/06/2019   Influenza, High Dose Seasonal PF 02/18/2020, 02/10/2021, 02/07/2022   Influenza-Unspecified 02/03/2013, 02/05/2014   PFIZER Comirnaty(Gray Top)Covid-19 Tri-Sucrose Vaccine 08/17/2020, 02/17/2022   PFIZER(Purple Top)SARS-COV-2 Vaccination 06/14/2019, 07/09/2019, 03/06/2020   Pfizer Covid-19 Vaccine Bivalent Booster 95yrs & up 01/27/2021   Pneumococcal Conjugate-13 02/18/2020   Pneumococcal Polysaccharide-23 03/24/2021   Pneumococcal-Unspecified 11/05/2012   Respiratory Syncytial Virus Vaccine,Recomb Aduvanted(Arexvy) 04/13/2022   Tdap 03/26/2015, 09/07/2021   Health Maintenance  Topic Date Due   Zoster Vaccines- Shingrix (1 of 2) Never done   OPHTHALMOLOGY EXAM  09/17/2020   Diabetic kidney evaluation - Urine ACR  03/24/2022   FOOT EXAM  03/24/2022   COVID-19 Vaccine (7 - 2023-24 season) 04/14/2022   Colonoscopy  07/01/2022   INFLUENZA VACCINE  12/07/2022   HEMOGLOBIN A1C  01/03/2023   Diabetic kidney evaluation -  eGFR measurement  07/06/2023   Medicare Annual Wellness (AWV)  10/06/2023   DTaP/Tdap/Td (3 - Td or Tdap) 09/08/2031   Pneumonia Vaccine 37+ Years old  Completed   Hepatitis C Screening  Completed   HPV VACCINES  Aged Out   Shingrix: insurance covers, can get at pharmacy  Covid 19: 2/2, 2021, pfizer + 3 boosters  Colonoscopy: 06/2012, diverticuloses, Dr. Juanda Chance, 10 year recall has scheduled for 10/2022 Sleep study 2017   Last eye exam: 10/2021, Dr. Joseph Art, will request report, monitoring cataract, has scheduled next month Last dental exam: Dr. Leticia Penna, had 07/2022, new implants Last derm exam: remote Dr. Yetta Barre yearly  Patient Care Team: Lucky Cowboy, [name] Marzella Schlein., MD (Ophthalmology) Berneice Heinrich Delbert Phenix., MD as Consulting Physician (Urology) Hart Carwin, MD (Inactive) as Consulting Physician (Gastroenterology) Charlett Nose, [facility] (Inactive) as Pharmacist (Pharmacist)   Medical History:  Past Medical History:  Diagnosis Date   Adult BMI 50.0-59.9 kg/sq m  52.77   Anal fissure    Cancer (HCC)    kidney cancer   Diabetes mellitus without complication (HCC)    borderline   Elevated PSA 08/13/2019   H/O: gout    STABLE   Hepatic steatosis 10/01/2011   History of kidney stones    Hyperlipidemia    Hypertension    Kidney tumor    right upper kidney tumor, monitoring   OSA on CPAP    [employer] [facility]   Parkinson disease 01/04/2016   REM sleep behavior disorder 12/06/2016   Right ureteral stone    Ureteral calculus, right 06/12/2011   Vitamin D deficiency    Weakness    Allergies Allergies  Allergen Reactions   Ciprofloxacin Hives   Ceftriaxone Hives    SURGICAL HISTORY He  has a past surgical history that includes Anal fissure repair (10-12-2000); Nasal septum surgery (1985); LEFT MEDIAL FEMORAL CONDYLE DEBRIDEMENT & DRILLING/ REMOVAL LOOSE BODY (09-15-2003); Stone extraction with basket (06/12/2011); kidney stone removal;  Colonoscopy (N/A, 07/01/2012); Cystoscopy with retrograde pyelogram, ureteroscopy and stent placement (Bilateral, 02/26/2013); Holmium laser application (Bilateral, 02/26/2013); and Robot assisted laparoscopic nephrectomy (Right, 12/16/2021). FAMILY HISTORY His family history includes Cancer in his father; Heart disease in his brother, maternal aunt, mother, and paternal grandmother; Hyperlipidemia in his brother, maternal aunt, mother, and paternal grandmother; Hypertension in his maternal aunt and mother; Lung cancer in his father; Melanoma in his brother; Stroke in his maternal aunt. SOCIAL HISTORY He  reports that he has quit smoking. He has never used smokeless tobacco. He reports that he does not drink alcohol and does not use drugs.  MEDICARE WELLNESS OBJECTIVES: Physical activity:   Cardiac risk factors:   Depression/mood screen:      10/06/2022    9:57 AM  Depression screen PHQ 2/9  Decreased Interest 0  Down, Depressed, Hopeless 0  PHQ - 2 Score 0    ADLs:     10/06/2022    9:55 AM 12/16/2021    6:00 PM  In your present state of health, do you have any difficulty performing the following activities:  Hearing? 0 0  Vision? 1 0  Comment Follows with Dr. Joseph Art in 10/2022   Difficulty concentrating or making decisions? 0 0  Walking or climbing stairs? 0 0  Dressing or bathing? 0 0  Doing errands, shopping? 0 0     Cognitive Testing  Alert? Yes  Normal Appearance?Yes  Oriented to person? Yes  Place? Yes   Time? Yes  Recall of three objects?  Yes  Can perform simple calculations? Yes  Displays appropriate judgment?Yes  Can read the correct time from a watch face?Yes  EOL planning:        Review of Systems:  Review of Systems  Constitutional:  Negative for malaise/fatigue and weight loss.  HENT:  Negative for hearing loss and tinnitus.   Eyes:  Negative for blurred vision and double vision.  Respiratory:  Negative for cough, shortness of breath and wheezing.    Cardiovascular:  Negative for chest pain, palpitations, orthopnea, claudication and leg swelling.  Gastrointestinal:  Positive for constipation. Negative for abdominal pain, blood in stool, diarrhea, heartburn, melena, nausea and vomiting.  Genitourinary: Negative.   Musculoskeletal:  Negative for joint pain and myalgias.  Skin:  Negative for rash.  Neurological:  Positive for tremors (L hand). Negative for dizziness, tingling, sensory change, weakness and headaches.  Endo/Heme/Allergies:  Negative for polydipsia.  Psychiatric/Behavioral:  Negative for depression and memory loss. The patient has  insomnia (occasionally ). The patient is not nervous/anxious (Rare).   All other systems reviewed and are negative.   Physical Exam: Estimated body mass index is 38.22 kg/m as calculated from the following:   Height as of this encounter: 5\' 7"  (1.702 m).   Weight as of this encounter: 244 lb (110.7 kg). BP 108/66   Pulse 80   Temp 97.8 F (36.6 C)   Ht 5\' 7"  (1.702 m)   Wt 244 lb (110.7 kg)   SpO2 99%   BMI 38.22 kg/m   General Appearance: Well nourished, well dressed morbidly obese male in no apparent distress.  Eyes: PERRLA, EOMs, conjunctiva no swelling or erythema ENT/Mouth: Ear canals clear bilaterally with no erythema, swelling, discharge.  TMs normal bilaterally with no erythema, bulging, or retractions.  Oropharynx clear and moist with no exudate, swelling, or erythema.  Dentition normal.   Neck: Supple, thyroid normal. No bruits, JVD, cervical adenopathy Respiratory: Respiratory effort normal, BS equal bilaterally without rales, rhonchi, wheezing or stridor.  Cardio: RRR without murmurs, rubs or gallops. Brisk peripheral pulses without edema.  Chest: symmetric, with normal excursions Abdomen: Soft, morbidly obese limiting exam, nontender, no guarding, rebound, hernias, masses, or organomegaly. Musculoskeletal: Full ROM all peripheral extremities,5/5 strength, and normal gait.   Skin: Warm, dry without rashes, lesions, ecchymosis. Neuro: A&Ox3, Cranial nerves intact, slow gait.  Overshoot of the right hand with finger to nose.  Tremor of the left hand with finger opposition>tremor of the right hand with finger opposition.  Sensation intact.  Psych: Flat affect, Insight and Judgment appropriate.    Medicare Attestation I have personally reviewed: The patient's medical and social history Their use of alcohol, tobacco or illicit drugs Their current medications and supplements The patient's functional ability including ADLs,fall risks, home safety risks, cognitive, and hearing and visual impairment Diet and physical activities Evidence for depression or mood disorders  The patient's weight, height, BMI, and visual acuity have been recorded in the chart.  I have made referrals, counseling, and provided education to the patient based on review of the above and I have provided the patient with a written personalized care plan for preventive services.    Over 40 minutes of exam, counseling, chart review and critical decision making was performed  Moksh Loomer, DNP, AGNP-C 10:07 AM [facility]

## 2022-10-06 NOTE — Patient Instructions (Signed)

## 2022-10-07 LAB — COMPLETE METABOLIC PANEL WITH GFR
AG Ratio: 1.3 (calc) (ref 1.0–2.5)
ALT: 12 U/L (ref 9–46)
AST: 12 U/L (ref 10–35)
Albumin: 4.3 g/dL (ref 3.6–5.1)
Alkaline phosphatase (APISO): 52 U/L (ref 35–144)
BUN/Creatinine Ratio: 20 (calc) (ref 6–22)
BUN: 35 mg/dL — ABNORMAL HIGH (ref 7–25)
CO2: 28 mmol/L (ref 20–32)
Calcium: 10.4 mg/dL — ABNORMAL HIGH (ref 8.6–10.3)
Chloride: 97 mmol/L — ABNORMAL LOW (ref 98–110)
Creat: 1.78 mg/dL — ABNORMAL HIGH (ref 0.70–1.35)
Globulin: 3.2 g/dL (calc) (ref 1.9–3.7)
Glucose, Bld: 226 mg/dL — ABNORMAL HIGH (ref 65–99)
Potassium: 4.2 mmol/L (ref 3.5–5.3)
Sodium: 136 mmol/L (ref 135–146)
Total Bilirubin: 0.5 mg/dL (ref 0.2–1.2)
Total Protein: 7.5 g/dL (ref 6.1–8.1)
eGFR: 41 mL/min/{1.73_m2} — ABNORMAL LOW (ref >=60)

## 2022-10-07 LAB — VITAMIN D 25 HYDROXY (VIT D DEFICIENCY, FRACTURES): Vit D, 25-Hydroxy: 78 ng/mL (ref 30–100)

## 2022-10-07 LAB — CBC WITH DIFFERENTIAL/PLATELET
Absolute Monocytes: 386 cells/uL (ref 200–950)
Basophils Absolute: 28 cells/uL (ref 0–200)
Basophils Relative: 0.4 %
Eosinophils Absolute: 131 cells/uL (ref 15–500)
Eosinophils Relative: 1.9 %
Hemoglobin: 14.2 g/dL (ref 13.2–17.1)
Lymphs Abs: 1815 cells/uL (ref 850–3900)
MCH: 28.9 pg (ref 27.0–33.0)
MCHC: 33.6 g/dL (ref 32.0–36.0)
Monocytes Relative: 5.6 %
Neutro Abs: 4540 cells/uL (ref 1500–7800)
Neutrophils Relative %: 65.8 %
RBC: 4.92 10*6/uL (ref 4.20–5.80)
RDW: 14.3 % (ref 11.0–15.0)
Total Lymphocyte: 26.3 %
WBC: 6.9 10*3/uL (ref 3.8–10.8)

## 2022-10-07 LAB — HEMOGLOBIN A1C
Hgb A1c MFr Bld: 6.9 % of total Hgb — ABNORMAL HIGH (ref <5.7)
Mean Plasma Glucose: 151 mg/dL
eAG (mmol/L): 8.4 mmol/L

## 2022-10-07 LAB — LIPID PANEL
Cholesterol: 94 mg/dL (ref <200)
HDL: 32 mg/dL — ABNORMAL LOW (ref >=40)
LDL Cholesterol (Calc): 36 mg/dL (calc)
Non-HDL Cholesterol (Calc): 62 mg/dL (calc) (ref <130)
Total CHOL/HDL Ratio: 2.9 (calc) (ref <5.0)
Triglycerides: 182 mg/dL — ABNORMAL HIGH (ref <150)

## 2022-10-09 DIAGNOSIS — G4733 Obstructive sleep apnea (adult) (pediatric): Secondary | ICD-10-CM | POA: Diagnosis not present

## 2022-10-24 LAB — HM DIABETES EYE EXAM

## 2022-10-30 ENCOUNTER — Other Ambulatory Visit (HOSPITAL_BASED_OUTPATIENT_CLINIC_OR_DEPARTMENT_OTHER): Payer: Self-pay

## 2022-10-30 ENCOUNTER — Encounter: Payer: Self-pay | Admitting: Diagnostic Neuroimaging

## 2022-10-30 ENCOUNTER — Ambulatory Visit: Payer: Medicare Other | Admitting: Diagnostic Neuroimaging

## 2022-10-30 VITALS — BP 134/76 | HR 68 | Ht 66.0 in | Wt 244.6 lb

## 2022-10-30 DIAGNOSIS — G20B1 Parkinson's disease with dyskinesia, without mention of fluctuations: Secondary | ICD-10-CM

## 2022-10-30 MED ORDER — CARBIDOPA-LEVODOPA 25-250 MG PO TABS
1.0000 | ORAL_TABLET | Freq: Four times a day (QID) | ORAL | 4 refills | Status: DC
  Filled 2022-10-30 – 2022-12-04 (×2): qty 360, 90d supply, fill #0
  Filled 2023-02-28 – 2023-03-01 (×2): qty 360, 90d supply, fill #1
  Filled 2023-05-25: qty 360, 90d supply, fill #2
  Filled 2023-08-13: qty 360, 90d supply, fill #3
  Filled 2023-11-30: qty 12, 3d supply, fill #4

## 2022-10-30 NOTE — Progress Notes (Signed)
[facility]  PATIENT: Todd Rice DOB: [birth year]  REFERRING CLINICIAN: Lucky Cowboy, MD HISTORY FROM: patient and wife REASON FOR VISIT: follow up   HISTORICAL  CHIEF COMPLAINT:  Chief Complaint  Patient presents with   Follow-up    Rm 7, here with wife Todd Rice Here to follow up on Parkinson's Disease. States that since last time medication was increased has been doing well it has made a difference on his tremors. Would like to discuss doing a myblock for his sialorrhea.     HISTORY OF PRESENT ILLNESS:   UPDATE (10/30/22, VRP): Since last visit, doing well. Symptoms are improved on high carb/levo dosing. Severity is mild. No alleviating or aggravating factors. Tolerating meds.  Some issues with sialorrhea. Some oral dyskinesias (randomly).   PRIOR HPI (MM, 09/05/22): "Parkinson's-  feels like medication doesn't last until the next dose. Currently taking sinemet 25-250 mg TID-- 6 AM, 12 PM and 6 PM. Notices trouble with balance. No falls. Tremor in the right hand- comes and goes. No trouble swallowing or chewing foods. Does drool out of the left side.   Vivid dreams have gotten better."  PRIOR HPI (Dr. Anne Hahn 01/04/16): Todd Rice is a 67 year old right-handed white male with a history of diabetes and obesity. The patient has noted onset within the last 3 months of some difficulty using his right arm. He has noted a change in his handwriting which has become quite small and sloppy. He has the sensation that he may be dragging his right leg. The patient denies any falls, he denies any weakness of the extremities or any numbness. The patient has had some alteration in speech at times, he will have a low amplitude speech, and his wife has indicated that he may slurred speech at times. The patient has been set up for MRI evaluation of the brain which is relatively unremarkable, very minimal white matter changes are noted. The patient is sent to this office for an  evaluation.     REVIEW OF SYSTEMS: Full 14 system review of systems performed and negative with exception of: as per HPI.  ALLERGIES: Allergies  Allergen Reactions   Ciprofloxacin Hives   Ceftriaxone Hives    HOME MEDICATIONS: Outpatient Medications Prior to Visit  Medication Sig Dispense Refill   allopurinol (ZYLOPRIM) 300 MG tablet Take 1 tablet (300 mg total) by mouth daily to prevent gout. 90 tablet 3   ALPRAZolam (XANAX) 0.5 MG tablet Take 1 tab up to three times a day only if needed for severe anxiety or panic attack. Avoid daily use. 90 tablet 0   calcium carbonate (TUMS - DOSED IN MG ELEMENTAL CALCIUM) 500 MG chewable tablet Chew 1 tablet by mouth daily as needed for indigestion or heartburn.     Cholecalciferol (VITAMIN D) 50 MCG (2000 UT) tablet Take 2,000 Units by mouth 2 (two) times daily.     ezetimibe (ZETIA) 10 MG tablet Take  1 tablet  Daily  for Cholesterol 90 tablet 3   glipiZIDE (GLUCOTROL) 5 MG tablet Take 1/2 tab as needed for glucose above 150. (Patient taking differently: Take 2.5-5 mg by mouth See admin instructions. Take 2.5 mg as needed for glucose above 150-199, take 5 mg as needed for glucose 200 or higher) 60 tablet 2   HYDROcodone-acetaminophen (NORCO) 5-325 MG tablet Take 1-2 tablets by mouth every 6 (six) hours as needed for moderate pain or severe pain. 20 tablet 0   Magnesium 250 MG TABS Take 1 tablet (250 mg  total) by mouth 2 (two) times daily with a meal. (Patient taking differently: Take 1 tablet by mouth daily.)     Menthol, Topical Analgesic, (BIOFREEZE EX) Apply 1 Application topically daily as needed (pain).     metFORMIN (GLUCOPHAGE-XR) 500 MG 24 hr tablet Take 2 tablets (1,000 mg total) by mouth 2 (two) times daily. 360 tablet 3   polyethylene glycol powder (GLYCOLAX/MIRALAX) 17 GM/SCOOP powder Take 17 g by mouth every other day. Alternating days with senna     pramipexole (MIRAPEX) 1 MG tablet Take 1 tablet (1 mg total) by mouth 3 (three) times  daily. 270 tablet 3   rosuvastatin (CRESTOR) 20 MG tablet Take 1 tablet (20 mg total) by mouth daily. 90 tablet 3   Semaglutide,0.25 or 0.5MG /DOS, (OZEMPIC, 0.25 OR 0.5 MG/DOSE,) 2 MG/3ML SOPN Start by injecting 0.25 mg into skin of stomach once weekly; if doing well increase to 0.5 mg in 4 weeks and continue at this dose. 3 mL 1   senna (SENOKOT) 8.6 MG tablet Take 2 tablets by mouth every other day. Alternating days with miralax     sodium chloride (OCEAN) 0.65 % SOLN nasal spray Place 1 spray into both nostrils as needed for congestion.     valsartan-hydrochlorothiazide (DIOVAN-HCT) 320-25 MG tablet Take 1 tablet by mouth daily for blood pressure 90 tablet 3   carbidopa-levodopa (SINEMET IR) 25-250 MG tablet Take 1 tablet by mouth 4 (four) times daily. 360 tablet 3   albuterol (VENTOLIN HFA) 108 (90 Base) MCG/ACT inhaler INHALE 2 PUFFS EVERY 4 HOURS (15 MINUTES APART) FOR ASTHMA RESCUE 6.7 g 1   fosfomycin (MONUROL) 3 g PACK 1 packet PO Begin today and repeat on Thursday (Patient not taking: Reported on 10/30/2022) 2 each 0   Facility-Administered Medications Prior to Visit  Medication Dose Route Frequency Provider Last Rate Last Admin   polyethylene glycol powder (GLYCOLAX/MIRALAX) container 255 g  1 Container Oral Once Loletta Parish., MD          PHYSICAL EXAM  GENERAL EXAM/CONSTITUTIONAL: Vitals:  Vitals:   10/30/22 1423  BP: 134/76  Pulse: 68  Weight: 244 lb 9.6 oz (110.9 kg)  Height: 5\' 6"  (1.676 m)   Body mass index is 39.48 kg/m. Wt Readings from Last 3 Encounters:  10/30/22 244 lb 9.6 oz (110.9 kg)  10/06/22 244 lb (110.7 kg)  09/19/22 244 lb 6.4 oz (110.9 kg)   Patient is in no distress; well developed, nourished and groomed; neck is supple  CARDIOVASCULAR: Examination of carotid arteries is normal; no carotid bruits Regular rate and rhythm, no murmurs Examination of peripheral vascular system by observation and palpation is normal  EYES: Ophthalmoscopic  exam of optic discs and posterior segments is normal; no papilledema or hemorrhages No results found.  MUSCULOSKELETAL: Gait, strength, tone, movements noted in Neurologic exam below  NEUROLOGIC: MENTAL STATUS:      No data to display         awake, alert, oriented to person, place and time recent and remote memory intact normal attention and concentration language fluent, comprehension intact, naming intact fund of knowledge appropriate  CRANIAL NERVE:  2nd - no papilledema on fundoscopic exam 2nd, 3rd, 4th, 6th - pupils equal and reactive to light, visual fields full to confrontation, extraocular muscles intact, no nystagmus 5th - facial sensation symmetric 7th - facial strength symmetric 8th - hearing intact 9th - palate elevates symmetrically, uvula midline 11th - shoulder shrug symmetric 12th - tongue protrusion midline MILD  MASKED FACIES AND HOARSE  MOTOR:  INTERMITTENT RESTING TREMOR ON RIGHT; COGWHEELING RIGIDITY IN BUE; BRADYKINESIA IN BUE AND BLE; Normal bulk and tone, full strength in the BUE, BLE  SENSORY:  normal and symmetric to light touch, temperature, vibration  COORDINATION:  finger-nose-finger, fine finger movements normal  REFLEXES:  deep tendon reflexes TRACE and symmetric  GAIT/STATION:  narrow based gait     DIAGNOSTIC DATA (LABS, IMAGING, TESTING) - I reviewed patient records, labs, notes, testing and imaging myself where available.  Lab Results  Component Value Date   WBC 6.9 10/06/2022   HGB 14.2 10/06/2022   HCT 42.2 10/06/2022   MCV 85.8 10/06/2022   PLT 163 10/06/2022      Component Value Date/Time   NA 136 10/06/2022 1010   K 4.2 10/06/2022 1010   CL 97 (L) 10/06/2022 1010   CO2 28 10/06/2022 1010   GLUCOSE 226 (H) 10/06/2022 1010   BUN 35 (H) 10/06/2022 1010   CREATININE 1.78 (H) 10/06/2022 1010   CALCIUM 10.4 (H) 10/06/2022 1010   PROT 7.5 10/06/2022 1010   ALBUMIN 3.8 10/24/2016 1601   AST 12 10/06/2022 1010    ALT 12 10/06/2022 1010   ALKPHOS 58 10/24/2016 1601   BILITOT 0.5 10/06/2022 1010   GFRNONAA 40 (L) 12/18/2021 0525   GFRNONAA 58 (L) 09/27/2020 1008   GFRAA 68 09/27/2020 1008   Lab Results  Component Value Date   CHOL 94 10/06/2022   HDL 32 (L) 10/06/2022   LDLCALC 36 10/06/2022   TRIG 182 (H) 10/06/2022   CHOLHDL 2.9 10/06/2022   Lab Results  Component Value Date   HGBA1C 6.9 (H) 10/06/2022   Lab Results  Component Value Date   VITAMINB12 396 03/28/2022   Lab Results  Component Value Date   TSH 2.60 09/20/2021    12/26/15 MRI brain - Mild chronic microvascular ischemic change. No acute intracranial findings. - Within limits for detection due to noncontrast examination, no intracranial metastatic disease is observed.   ASSESSMENT AND PLAN  67 y.o. year old male here with:  Dx:  1. Parkinson's disease without dyskinesia or fluctuating manifestations     PLAN:  PARKINSON'S DISEASE (since ~2017) - continue carb/levo 25/250 1 tab 4x per day (7a, 11a, 3p, 7p) - continue PT exercises; continue to optimize nutrition and activity - refer to Dr. Terrace Arabia for botox for sialorrhea  Meds ordered this encounter  Medications   carbidopa-levodopa (SINEMET IR) 25-250 MG tablet    Sig: Take 1 tablet by mouth 4 (four) times daily.    Dispense:  360 tablet    Refill:  4   Return in about 1 year (around 10/30/2023) for with NP Butch Penny).    Suanne Marker, MD 10/30/2022, 2:53 PM Certified in Neurology, Neurophysiology and Neuroimaging  [facility] [address] [place], [place] [address] [phone]

## 2022-11-01 ENCOUNTER — Encounter: Payer: Medicare Other | Admitting: Internal Medicine

## 2022-11-15 ENCOUNTER — Encounter: Payer: Self-pay | Admitting: Internal Medicine

## 2022-11-23 ENCOUNTER — Ambulatory Visit (AMBULATORY_SURGERY_CENTER): Payer: Medicare Other | Admitting: Internal Medicine

## 2022-11-23 ENCOUNTER — Encounter: Payer: Self-pay | Admitting: Internal Medicine

## 2022-11-23 VITALS — BP 122/56 | HR 66 | Temp 97.7°F | Resp 13 | Ht 67.0 in | Wt 244.0 lb

## 2022-11-23 DIAGNOSIS — D120 Benign neoplasm of cecum: Secondary | ICD-10-CM

## 2022-11-23 DIAGNOSIS — Z1211 Encounter for screening for malignant neoplasm of colon: Secondary | ICD-10-CM

## 2022-11-23 DIAGNOSIS — K635 Polyp of colon: Secondary | ICD-10-CM | POA: Diagnosis not present

## 2022-11-23 HISTORY — PX: COLONOSCOPY: SHX174

## 2022-11-23 MED ORDER — SODIUM CHLORIDE 0.9 % IV SOLN
500.0000 mL | Freq: Once | INTRAVENOUS | Status: DC
Start: 2022-11-23 — End: 2022-11-23

## 2022-11-23 NOTE — Progress Notes (Signed)
GASTROENTEROLOGY PROCEDURE H&P NOTE   Primary Care Physician: Lucky Cowboy, MD    Reason for Procedure:   Colon cancer screening  Plan:    Colonoscopy  Patient is appropriate for endoscopic procedure(s) in the ambulatory ([facility]) setting.  The nature of the procedure, as well as the risks, benefits, and alternatives were carefully and thoroughly reviewed with the patient. Ample time for discussion and questions allowed. The patient understood, was satisfied, and agreed to proceed.     HPI: Todd Rice is a 67 y.o. male who presents for colonoscopy for evaluation of colon cancer screening .  Patient was most recently seen in the Gastroenterology Clinic on 09/19/22.  No interval change in medical history since that appointment. Please refer to that note for full details regarding GI history and clinical presentation.   Past Medical History:  Diagnosis Date   Adult BMI 50.0-59.9 kg/sq m    52.77   Anal fissure    Cancer (HCC)    kidney cancer   Diabetes mellitus without complication (HCC)    borderline   Elevated PSA 08/13/2019   H/O: gout    STABLE   Hepatic steatosis 10/01/2011   History of kidney stones    Hyperlipidemia    Hypertension    Kidney tumor    right upper kidney tumor, monitoring   OSA on CPAP    [employer]   Parkinson disease 01/04/2016   REM sleep behavior disorder 12/06/2016   Right ureteral stone    Sleep apnea    Ureteral calculus, right 06/12/2011   Vitamin D deficiency    Weakness     Past Surgical History:  Procedure Laterality Date   ANAL FISSURE REPAIR  10-12-2000   COLONOSCOPY N/A 07/01/2012   Procedure: COLONOSCOPY;  Surgeon: Hart Carwin, MD;  Location: [facility];  Service: Endoscopy;  Laterality: N/A;   CYSTOSCOPY WITH RETROGRADE PYELOGRAM, URETEROSCOPY AND STENT PLACEMENT Bilateral 02/26/2013   Procedure: CYSTOSCOPY WITH RETROGRADE PYELOGRAM, URETEROSCOPY AND STENT PLACEMENT;  Surgeon: Sebastian Ache, MD;  Location: [facility];  Service: Urology;  Laterality: Bilateral;   HOLMIUM LASER APPLICATION Bilateral 02/26/2013   Procedure: HOLMIUM LASER APPLICATION;  Surgeon: Sebastian Ache, MD;  Location: [facility];  Service: Urology;  Laterality: Bilateral;   kidney stone removal     06/2011-stent placement   LEFT MEDIAL FEMORAL CONDYLE DEBRIDEMENT & DRILLING/ REMOVAL LOOSE BODY  09-15-2003   NASAL SEPTUM SURGERY  1985   ROBOT ASSISTED LAPAROSCOPIC NEPHRECTOMY Right 12/16/2021   Procedure: XI ROBOTIC ASSISTED LAPAROSCOPIC RADICAL NEPHRECTOMY;  Surgeon: Sebastian Ache, MD;  Location: [facility];  Service: Urology;  Laterality: Right;  3 HRS   STONE EXTRACTION WITH BASKET  06/12/2011   Procedure: STONE EXTRACTION WITH BASKET;  Surgeon: Garnett Farm, MD;  Location: [facility];  Service: Urology;  Laterality: Right;    Prior to Admission medications   Medication Sig Start Date End Date Taking? Authorizing Provider  allopurinol (ZYLOPRIM) 300 MG tablet Take 1 tablet (300 mg total) by mouth daily to prevent gout. 07/03/22 07/03/23 Yes Cranford, Archie Patten, NP  carbidopa-levodopa (SINEMET IR) 25-250 MG tablet Take 1 tablet by mouth 4 (four) times daily. 10/30/22  Yes Penumalli, Glenford Bayley, MD  Cholecalciferol (VITAMIN D) 50 MCG (2000 UT) tablet Take 2,000 Units by mouth 2 (two) times daily.   Yes [provider]  ezetimibe (ZETIA) 10 MG tablet Take  1 tablet  Daily  for Cholesterol 03/15/22  Yes Raynelle Dick, NP  Magnesium  250 MG TABS Take 1 tablet (250 mg total) by mouth 2 (two) times daily with a meal. Patient taking differently: Take 1 tablet by mouth daily. 09/21/21  Yes Judd Gaudier, NP  Menthol, Topical Analgesic, (BIOFREEZE EX) Apply 1 Application topically daily as needed (pain).   Yes [provider]  metFORMIN (GLUCOPHAGE-XR) 500 MG 24 hr tablet Take 2 tablets (1,000 mg total) by mouth 2 (two) times daily. 09/21/22  Yes Raynelle Dick, NP  polyethylene glycol powder (GLYCOLAX/MIRALAX) 17  GM/SCOOP powder Take 17 g by mouth every other day. Alternating days with senna   Yes [provider]  pramipexole (MIRAPEX) 1 MG tablet Take 1 tablet (1 mg total) by mouth 3 (three) times daily. 09/05/22  Yes Butch Penny, NP  rosuvastatin (CRESTOR) 20 MG tablet Take 1 tablet (20 mg total) by mouth daily. 09/05/22  Yes Raynelle Dick, NP  senna (SENOKOT) 8.6 MG tablet Take 2 tablets by mouth every other day. Alternating days with miralax   Yes [provider]  valsartan-hydrochlorothiazide (DIOVAN-HCT) 320-25 MG tablet Take 1 tablet by mouth daily for blood pressure 05/22/22  Yes Lucky Cowboy, MD  albuterol (VENTOLIN HFA) 108 (90 Base) MCG/ACT inhaler INHALE 2 PUFFS EVERY 4 HOURS (15 MINUTES APART) FOR ASTHMA RESCUE 09/20/21 10/06/22  Judd Gaudier, NP  ALPRAZolam Prudy Feeler) 0.5 MG tablet Take 1 tab up to three times a day only if needed for severe anxiety or panic attack. Avoid daily use. 12/08/21   Raynelle Dick, NP  calcium carbonate (TUMS - DOSED IN MG ELEMENTAL CALCIUM) 500 MG chewable tablet Chew 1 tablet by mouth daily as needed for indigestion or heartburn.    [provider]  glipiZIDE (GLUCOTROL) 5 MG tablet Take 1/2 tab as needed for glucose above 150. Patient taking differently: Take 2.5-5 mg by mouth See admin instructions. Take 2.5 mg as needed for glucose above 150-199, take 5 mg as needed for glucose 200 or higher 09/20/21   Judd Gaudier, NP  Semaglutide,0.25 or 0.5MG /DOS, (OZEMPIC, 0.25 OR 0.5 MG/DOSE,) 2 MG/3ML SOPN Start by injecting 0.25 mg into skin of stomach once weekly; if doing well increase to 0.5 mg in 4 weeks and continue at this dose. 09/21/22   Raynelle Dick, NP  sodium chloride (OCEAN) 0.65 % SOLN nasal spray Place 1 spray into both nostrils as needed for congestion.    [provider]    Current Outpatient Medications  Medication Sig Dispense Refill   allopurinol (ZYLOPRIM) 300 MG tablet Take 1 tablet (300 mg total) by  mouth daily to prevent gout. 90 tablet 3   carbidopa-levodopa (SINEMET IR) 25-250 MG tablet Take 1 tablet by mouth 4 (four) times daily. 360 tablet 4   Cholecalciferol (VITAMIN D) 50 MCG (2000 UT) tablet Take 2,000 Units by mouth 2 (two) times daily.     ezetimibe (ZETIA) 10 MG tablet Take  1 tablet  Daily  for Cholesterol 90 tablet 3   Magnesium 250 MG TABS Take 1 tablet (250 mg total) by mouth 2 (two) times daily with a meal. (Patient taking differently: Take 1 tablet by mouth daily.)     Menthol, Topical Analgesic, (BIOFREEZE EX) Apply 1 Application topically daily as needed (pain).     metFORMIN (GLUCOPHAGE-XR) 500 MG 24 hr tablet Take 2 tablets (1,000 mg total) by mouth 2 (two) times daily. 360 tablet 3   polyethylene glycol powder (GLYCOLAX/MIRALAX) 17 GM/SCOOP powder Take 17 g by mouth every other day. Alternating days with senna  pramipexole (MIRAPEX) 1 MG tablet Take 1 tablet (1 mg total) by mouth 3 (three) times daily. 270 tablet 3   rosuvastatin (CRESTOR) 20 MG tablet Take 1 tablet (20 mg total) by mouth daily. 90 tablet 3   senna (SENOKOT) 8.6 MG tablet Take 2 tablets by mouth every other day. Alternating days with miralax     valsartan-hydrochlorothiazide (DIOVAN-HCT) 320-25 MG tablet Take 1 tablet by mouth daily for blood pressure 90 tablet 3   albuterol (VENTOLIN HFA) 108 (90 Base) MCG/ACT inhaler INHALE 2 PUFFS EVERY 4 HOURS (15 MINUTES APART) FOR ASTHMA RESCUE 6.7 g 1   ALPRAZolam (XANAX) 0.5 MG tablet Take 1 tab up to three times a day only if needed for severe anxiety or panic attack. Avoid daily use. 90 tablet 0   calcium carbonate (TUMS - DOSED IN MG ELEMENTAL CALCIUM) 500 MG chewable tablet Chew 1 tablet by mouth daily as needed for indigestion or heartburn.     glipiZIDE (GLUCOTROL) 5 MG tablet Take 1/2 tab as needed for glucose above 150. (Patient taking differently: Take 2.5-5 mg by mouth See admin instructions. Take 2.5 mg as needed for glucose above 150-199, take 5 mg as  needed for glucose 200 or higher) 60 tablet 2   Semaglutide,0.25 or 0.5MG /DOS, (OZEMPIC, 0.25 OR 0.5 MG/DOSE,) 2 MG/3ML SOPN Start by injecting 0.25 mg into skin of stomach once weekly; if doing well increase to 0.5 mg in 4 weeks and continue at this dose. 3 mL 1   sodium chloride (OCEAN) 0.65 % SOLN nasal spray Place 1 spray into both nostrils as needed for congestion.     Current Facility-Administered Medications  Medication Dose Route Frequency Provider Last Rate Last Admin   0.9 %  sodium chloride infusion  500 mL Intravenous Once Imogene Burn, MD       Facility-Administered Medications Ordered in Other Visits  Medication Dose Route Frequency Provider Last Rate Last Admin   polyethylene glycol powder (GLYCOLAX/MIRALAX) container 255 g  1 Container Oral Once Loletta Parish., MD        Allergies as of 11/23/2022 - Review Complete 11/23/2022  Allergen Reaction Noted   Ciprofloxacin Hives 01/16/2017   Ceftriaxone Hives 04/11/2016    Family History  Problem Relation Age of Onset   Heart disease Mother        Atrial fibrillation   Hypertension Mother    Hyperlipidemia Mother    Lung cancer Father        was a smoker   Cancer Father        lung   Hyperlipidemia Brother    Melanoma Brother    Heart disease Brother        Amyloid   Heart disease Maternal Aunt    Hyperlipidemia Maternal Aunt    Hypertension Maternal Aunt    Stroke Maternal Aunt    Heart disease Paternal Grandmother    Hyperlipidemia Paternal Grandmother    Parkinson's disease Neg Hx    Sleep apnea Neg Hx    Colon cancer Neg Hx    Rectal cancer Neg Hx    Stomach cancer Neg Hx     Social History   Socioeconomic History   Marital status: Married    Spouse name: Dawn   Number of children: 0   Years of education: Not on file   Highest education level: Not on file  Occupational History    Comment: retired  Tobacco Use   Smoking status: Former   Smokeless tobacco:  Never  Vaping Use   Vaping  status: Never Used  Substance and Sexual Activity   Alcohol use: No   Drug use: No    Comment: QUIT SMOKING " POT" 40 YRS AGO   Sexual activity: Yes    Partners: Female  Other Topics Concern   Not on file  Social History Narrative   Right-handed.   No caffeine use.   Lives at home with his wife.   Social Determinants of Health   Financial Resource Strain: Not on file  Food Insecurity: Not on file  Transportation Needs: Not on file  Physical Activity: Not on file  Stress: Not on file  Social Connections: Not on file  Intimate Partner Violence: Not on file    Physical Exam: Vital signs in last 24 hours: BP (!) 160/110   Pulse 69   Temp 97.7 F (36.5 C)   Resp (!) 9   Ht 5\' 7"  (1.702 m)   Wt 244 lb (110.7 kg)   SpO2 99%   BMI 38.22 kg/m  GEN: NAD EYE: Sclerae anicteric ENT: MMM CV: Non-tachycardic Pulm: No increased WOB GI: Soft NEURO:  Alert & Oriented   Eulah Pont, MD [facility]   11/23/2022 8:15 AM

## 2022-11-23 NOTE — Progress Notes (Signed)
Called to room to assist during endoscopic procedure.  Patient ID and intended procedure confirmed with present staff. Received instructions for my participation in the procedure from the performing physician.  

## 2022-11-23 NOTE — Op Note (Signed)
[facility] Patient Name: Todd Rice Procedure Date: 11/23/2022 7:21 AM MRN: [id number] Endoscopist: Particia Lather , , [id number] Age: 67 Referring MD:  Date of Birth: 04-21-1956 Gender: Male Account #: [id number] Procedure:                Colonoscopy Indications:              Screening for colorectal malignant neoplasm Medicines:                Monitored Anesthesia Care Procedure:                Pre-Anesthesia Assessment:                           - Prior to the procedure, a History and Physical                            was performed, and patient medications and                            allergies were reviewed. The patient's tolerance of                            previous anesthesia was also reviewed. The risks                            and benefits of the procedure and the sedation                            options and risks were discussed with the patient.                            All questions were answered, and informed consent                            was obtained. Prior Anticoagulants: The patient has                            taken no anticoagulant or antiplatelet agents. ASA                            Grade Assessment: III - A patient with severe                            systemic disease. After reviewing the risks and                            benefits, the patient was deemed in satisfactory                            condition to undergo the procedure.                           After obtaining informed consent, the colonoscope  was passed under direct vision. Throughout the                            procedure, the patient's blood pressure, pulse, and                            oxygen saturations were monitored continuously. The                            CF [id number] #[id number] was introduced through the anus                            and advanced to the the terminal ileum. The                            colonoscopy  was performed without difficulty. The                            patient tolerated the procedure well. The quality                            of the bowel preparation was good. The terminal                            ileum, ileocecal valve, appendiceal orifice, and                            rectum were photographed. Scope In: 8:18:18 AM Scope Out: 8:39:30 AM Scope Withdrawal Time: 0 hours 12 minutes 41 seconds  Total Procedure Duration: 0 hours 21 minutes 12 seconds  Findings:                 The terminal ileum appeared normal.                           Three sessile polyps were found in the cecum. The                            polyps were 3 to 6 mm in size. These polyps were                            removed with a cold snare. Resection and retrieval                            were complete.                           Multiple diverticula were found in the sigmoid                            colon.                           Non-bleeding internal hemorrhoids were found during  retroflexion. Complications:            No immediate complications. Estimated Blood Loss:     Estimated blood loss was minimal. Impression:               - The examined portion of the ileum was normal.                           - Three 3 to 6 mm polyps in the cecum, removed with                            a cold snare. Resected and retrieved.                           - Diverticulosis in the sigmoid colon.                           - Non-bleeding internal hemorrhoids. Recommendation:           - Discharge patient to home (with escort).                           - Await pathology results.                           - The findings and recommendations were discussed                            with the patient. Dr Particia Lather "Alan Ripper" Leonides Schanz,  11/23/2022 8:43:57 AM

## 2022-11-23 NOTE — Progress Notes (Signed)
 Uneventful propofol anesthetic. Report to pacu rn. Vss. Care resumed by rn.

## 2022-11-23 NOTE — Patient Instructions (Addendum)
Resume previous diet Continue present medications Await pathology results  Handouts/information given for polyps, diverticulosis and hemorrhoids  YOU HAD AN ENDOSCOPIC PROCEDURE TODAY AT THE [facility]:   Refer to the procedure report that was given to you for any specific questions about what was found during the examination.  If the procedure report does not answer your questions, please call your gastroenterologist to clarify.  If you requested that your care partner not be given the details of your procedure findings, then the procedure report has been included in a sealed envelope for you to review at your convenience later.  YOU SHOULD EXPECT: Some feelings of bloating in the abdomen. Passage of more gas than usual.  Walking can help get rid of the air that was put into your GI tract during the procedure and reduce the bloating. If you had a lower endoscopy (such as a colonoscopy or flexible sigmoidoscopy) you may notice spotting of blood in your stool or on the toilet paper. If you underwent a bowel prep for your procedure, you may not have a normal bowel movement for a few days.  Please Note:  You might notice some irritation and congestion in your nose or some drainage.  This is from the oxygen used during your procedure.  There is no need for concern and it should clear up in a day or so.  SYMPTOMS TO REPORT IMMEDIATELY:  Following lower endoscopy (colonoscopy):  Excessive amounts of blood in the stool  Significant tenderness or worsening of abdominal pains  Swelling of the abdomen that is new, acute  Fever of 100F or higher  For urgent or emergent issues, a gastroenterologist can be reached at any hour by calling [phone]. Do not use MyChart messaging for urgent concerns.   DIET:  We do recommend a small meal at first, but then you may proceed to your regular diet.  Drink plenty of fluids but you should avoid alcoholic beverages for 24 hours.  ACTIVITY:  You  should plan to take it easy for the rest of today and you should NOT DRIVE or use heavy machinery until tomorrow (because of the sedation medicines used during the test).    FOLLOW UP: Our staff will call the number listed on your records the next business day following your procedure.  We will call around 7:15- 8:00 am to check on you and address any questions or concerns that you may have regarding the information given to you following your procedure. If we do not reach you, we will leave a message.     If any biopsies were taken you will be contacted by phone or by letter within the next 1-3 weeks.  Please call us at [phone] if you have not heard about the biopsies in 3 weeks.    SIGNATURES/CONFIDENTIALITY: You and/or your care partner have signed paperwork which will be entered into your electronic medical record.  These signatures attest to the fact that that the information above on your After Visit Summary has been reviewed and is understood.  Full responsibility of the confidentiality of this discharge information lies with you and/or your care-partner. 

## 2022-11-24 ENCOUNTER — Telehealth: Payer: Self-pay | Admitting: *Deleted

## 2022-11-24 NOTE — Telephone Encounter (Signed)
  Follow up Call-     11/23/2022    7:20 AM  Call back number  Post procedure Call Back phone  # [phone]  Permission to leave phone message Yes     Patient questions:  Do you have a fever, pain , or abdominal swelling? No. Pain Score  0 *  Have you tolerated food without any problems? Yes.    Have you been able to return to your normal activities? Yes.    Do you have any questions about your discharge instructions: Diet   No. Medications  No. Follow up visit  No.  Do you have questions or concerns about your Care? No.  Actions: * If pain score is 4 or above: No action needed, pain <4.

## 2022-11-29 ENCOUNTER — Encounter: Payer: Self-pay | Admitting: Internal Medicine

## 2022-12-04 ENCOUNTER — Other Ambulatory Visit: Payer: Self-pay

## 2022-12-04 ENCOUNTER — Other Ambulatory Visit: Payer: Self-pay | Admitting: Nurse Practitioner

## 2022-12-04 ENCOUNTER — Other Ambulatory Visit (HOSPITAL_BASED_OUTPATIENT_CLINIC_OR_DEPARTMENT_OTHER): Payer: Self-pay

## 2022-12-04 MED ORDER — ALPRAZOLAM 0.5 MG PO TABS
ORAL_TABLET | ORAL | 0 refills | Status: DC
  Filled 2022-12-04: qty 80, 27d supply, fill #0

## 2022-12-18 ENCOUNTER — Encounter: Payer: Self-pay | Admitting: Adult Health

## 2022-12-20 ENCOUNTER — Telehealth: Payer: Self-pay | Admitting: Diagnostic Neuroimaging

## 2022-12-20 ENCOUNTER — Other Ambulatory Visit: Payer: Self-pay | Admitting: Neurology

## 2022-12-20 DIAGNOSIS — G20B1 Parkinson's disease with dyskinesia, without mention of fluctuations: Secondary | ICD-10-CM

## 2022-12-20 DIAGNOSIS — G20A1 Parkinson's disease without dyskinesia, without mention of fluctuations: Secondary | ICD-10-CM

## 2022-12-20 NOTE — Telephone Encounter (Signed)
Placed referral for botox to Dr Terrace Arabia as discussed in previous ov notes

## 2022-12-20 NOTE — Telephone Encounter (Signed)
Referral for neurology sent through to [facility] for Consult for Botox with Dr. Terrace Arabia.

## 2022-12-20 NOTE — Telephone Encounter (Signed)
Per last OV with Dr Marjory Lies "refer to Dr. Terrace Arabia for botox for sialorrhea "

## 2022-12-25 ENCOUNTER — Other Ambulatory Visit: Payer: Self-pay

## 2022-12-25 ENCOUNTER — Other Ambulatory Visit (HOSPITAL_BASED_OUTPATIENT_CLINIC_OR_DEPARTMENT_OTHER): Payer: Self-pay

## 2022-12-28 ENCOUNTER — Telehealth: Payer: Self-pay

## 2022-12-28 ENCOUNTER — Other Ambulatory Visit (HOSPITAL_BASED_OUTPATIENT_CLINIC_OR_DEPARTMENT_OTHER): Payer: Self-pay

## 2022-12-28 ENCOUNTER — Ambulatory Visit: Payer: Medicare Other | Admitting: Neurology

## 2022-12-28 ENCOUNTER — Encounter: Payer: Self-pay | Admitting: Neurology

## 2022-12-28 VITALS — BP 145/78 | HR 61 | Ht 67.0 in | Wt 246.5 lb

## 2022-12-28 DIAGNOSIS — G4752 REM sleep behavior disorder: Secondary | ICD-10-CM | POA: Diagnosis not present

## 2022-12-28 DIAGNOSIS — K117 Disturbances of salivary secretion: Secondary | ICD-10-CM

## 2022-12-28 DIAGNOSIS — G20B1 Parkinson's disease with dyskinesia, without mention of fluctuations: Secondary | ICD-10-CM | POA: Diagnosis not present

## 2022-12-28 MED ORDER — GLYCOPYRROLATE 1 MG PO TABS
1.0000 mg | ORAL_TABLET | Freq: Two times a day (BID) | ORAL | 5 refills | Status: DC | PRN
  Filled 2022-12-28: qty 60, 30d supply, fill #0

## 2022-12-28 NOTE — Progress Notes (Signed)
Chief Complaint  Patient presents with   New Patient (Initial Visit)    Rm 12. Patient with wife, Todd Rice. Being seen for extra drooling and wanting injection consult to see about injections to help.       ASSESSMENT AND PLAN  Todd Rice is a 67 y.o. male   Idiopathic Parkinson's disease Sialorrhea  Have suggested to him to try suck on ice chips or sugarless candy to stimulate swallowing reflex,  Glycopyrrolate 1mg  bid as needed.  BOTOX prior authorization, will proceed with BOTOX injection if above measures failed.  DIAGNOSTIC DATA (LABS, IMAGING, TESTING) - I reviewed patient records, labs, notes, testing and imaging myself where available.   MEDICAL HISTORY:  Todd Rice, Todd Rice a 67 year old male, seen in request by Dr. Marjory Lies, Satira Sark R, for evaluation of botulism toxin injection for sialorrhea  I reviewed and summarized the referring note.PMHX Obesity Parkinson's disease OSA DM HLD HTN  He was diagnosed of Parkinson's disease since 2017, responding well to Sinemet , 6 months ago, Sinemet dose was increased to 25/100 mg 4 times a day at 7, 11, 3 and 7 PM, higher dose of Sinemet helped him move better, no longer have noticeable wearing off, he remain active, mowing the yard, ride bicycle few times each week, no significant gait abnormality  Since June 2024, he began to notice excessive drooling, intermittent, sometimes worse than the others, he has to wipe his mouth corner often, sometimes wake up in the middle of the night, saliva right his pillowcase  He has not tried other treatment for his excessive sialorrhea yet,  PHYSICAL EXAM:   Vitals:   12/28/22 1614 12/28/22 1617  BP: (!) 150/78 (!) 145/78  Pulse: 61 61  Weight: 246 lb 8 oz (111.8 kg)   Height: 5\' 7"  (1.702 m)    Not recorded     Body mass index is 38.61 kg/m.  PHYSICAL EXAMNIATION:  Gen: NAD, conversant, well nourised, well groomed                     Cardiovascular: Regular rate  rhythm, no peripheral edema, warm, nontender. Eyes: Conjunctivae clear without exudates or hemorrhage Neck: Supple, no carotid bruits. Pulmonary: Clear to auscultation bilaterally   NEUROLOGICAL EXAM:  MENTAL STATUS: Speech/cognition: Obese, mild masked face, awake, alert, oriented to history taking and casual conversation CRANIAL NERVES: CN II: Visual fields are full to confrontation. Pupils are round equal and briskly reactive to light. CN III, IV, VI: extraocular movement are normal. No ptosis. CN V: Facial sensation is intact to light touch CN VII: Face is symmetric with normal eye closure  CN VIII: Hearing is normal to causal conversation. CN IX, X: Phonation is normal. CN XI: Head turning and shoulder shrug are intact CN XII: Narrow oropharyngeal space  MOTOR: Normal strength, no significant resting tremor, mild left more than right rigidity, bradykinesia  REFLEXES: Reflexes are 1 and symmetric at the biceps, triceps, knees, and ankles. Plantar responses are flexor.  SENSORY: Intact to light touch, pinprick and vibratory sensation are intact in fingers and toes.  COORDINATION: There is no trunk or limb dysmetria noted.  GAIT/STANCE: Able to get up from seated position arm crossed, steady, mildly decreased left arm swing, moderate stride, smooth turning  REVIEW OF SYSTEMS:  Full 14 system review of systems performed and notable only for as above All other review of systems were negative.   ALLERGIES: Allergies  Allergen Reactions   Ciprofloxacin Hives   Ceftriaxone Hives  HOME MEDICATIONS: Current Outpatient Medications  Medication Sig Dispense Refill   allopurinol (ZYLOPRIM) 300 MG tablet Take 1 tablet (300 mg total) by mouth daily to prevent gout. 90 tablet 3   ALPRAZolam (XANAX) 0.5 MG tablet Take 1 tab up to three times a day only if needed for severe anxiety or panic attack. Avoid daily use. 80 tablet 0   carbidopa-levodopa (SINEMET IR) 25-250 MG tablet  Take 1 tablet by mouth 4 (four) times daily. 360 tablet 4   Cholecalciferol (VITAMIN D) 50 MCG (2000 UT) tablet Take 2,000 Units by mouth 2 (two) times daily.     ezetimibe (ZETIA) 10 MG tablet Take  1 tablet  Daily  for Cholesterol 90 tablet 3   glipiZIDE (GLUCOTROL) 5 MG tablet Take 1/2 tab as needed for glucose above 150. (Patient taking differently: Take 2.5-5 mg by mouth See admin instructions. Take 2.5 mg as needed for glucose above 150-199, take 5 mg as needed for glucose 200 or higher) 60 tablet 2   Magnesium 250 MG TABS Take 1 tablet (250 mg total) by mouth 2 (two) times daily with a meal. (Patient taking differently: Take 1 tablet by mouth daily.)     metFORMIN (GLUCOPHAGE-XR) 500 MG 24 hr tablet Take 2 tablets (1,000 mg total) by mouth 2 (two) times daily. 360 tablet 3   polyethylene glycol powder (GLYCOLAX/MIRALAX) 17 GM/SCOOP powder Take 17 g by mouth every other day. Alternating days with senna     pramipexole (MIRAPEX) 1 MG tablet Take 1 tablet (1 mg total) by mouth 3 (three) times daily. 270 tablet 3   rosuvastatin (CRESTOR) 20 MG tablet Take 1 tablet (20 mg total) by mouth daily. 90 tablet 3   Semaglutide,0.25 or 0.5MG /DOS, (OZEMPIC, 0.25 OR 0.5 MG/DOSE,) 2 MG/3ML SOPN Start by injecting 0.25 mg into skin of stomach once weekly; if doing well increase to 0.5 mg in 4 weeks and continue at this dose. 3 mL 1   senna (SENOKOT) 8.6 MG tablet Take 2 tablets by mouth every other day. Alternating days with miralax     sodium chloride (OCEAN) 0.65 % SOLN nasal spray Place 1 spray into both nostrils as needed for congestion.     valsartan-hydrochlorothiazide (DIOVAN-HCT) 320-25 MG tablet Take 1 tablet by mouth daily for blood pressure 90 tablet 3   albuterol (VENTOLIN HFA) 108 (90 Base) MCG/ACT inhaler INHALE 2 PUFFS EVERY 4 HOURS (15 MINUTES APART) FOR ASTHMA RESCUE 6.7 g 1   calcium carbonate (TUMS - DOSED IN MG ELEMENTAL CALCIUM) 500 MG chewable tablet Chew 1 tablet by mouth daily as needed  for indigestion or heartburn. (Patient not taking: Reported on 12/28/2022)     Menthol, Topical Analgesic, (BIOFREEZE EX) Apply 1 Application topically daily as needed (pain). (Patient not taking: Reported on 12/28/2022)     No current facility-administered medications for this visit.   Facility-Administered Medications Ordered in Other Visits  Medication Dose Route Frequency Provider Last Rate Last Admin   polyethylene glycol powder (GLYCOLAX/MIRALAX) container 255 g  1 Container Oral Once Loletta Parish., MD        PAST MEDICAL HISTORY: Past Medical History:  Diagnosis Date   Adult BMI 50.0-59.9 kg/sq m    52.77   Anal fissure    Cancer (HCC)    kidney cancer   Diabetes mellitus without complication (HCC)    borderline   Elevated PSA 08/13/2019   H/O: gout    STABLE   Hepatic steatosis 10/01/2011   History  of kidney stones    Hyperlipidemia    Hypertension    Kidney tumor    right upper kidney tumor, monitoring   OSA on CPAP    [facility] [facility]   Parkinson disease 01/04/2016   REM sleep behavior disorder 12/06/2016   Right ureteral stone    Sleep apnea    Ureteral calculus, right 06/12/2011   Vitamin D deficiency    Weakness     PAST SURGICAL HISTORY: Past Surgical History:  Procedure Laterality Date   ANAL FISSURE REPAIR  10/12/2000   COLONOSCOPY N/A 07/01/2012   Procedure: COLONOSCOPY;  Surgeon: Hart Carwin, MD;  Location: [facility];  Service: Endoscopy;  Laterality: N/A;   COLONOSCOPY  11/23/2022   CYSTOSCOPY WITH RETROGRADE PYELOGRAM, URETEROSCOPY AND STENT PLACEMENT Bilateral 02/26/2013   Procedure: CYSTOSCOPY WITH RETROGRADE PYELOGRAM, URETEROSCOPY AND STENT PLACEMENT;  Surgeon: Sebastian Ache, MD;  Location: [facility];  Service: Urology;  Laterality: Bilateral;   HOLMIUM LASER APPLICATION Bilateral 02/26/2013   Procedure: HOLMIUM LASER APPLICATION;  Surgeon: Sebastian Ache, MD;  Location: [facility];  Service: Urology;  Laterality: Bilateral;   kidney stone  removal     06/2011-stent placement   LEFT MEDIAL FEMORAL CONDYLE DEBRIDEMENT & DRILLING/ REMOVAL LOOSE BODY  09/15/2003   NASAL SEPTUM SURGERY  05/09/1983   ROBOT ASSISTED LAPAROSCOPIC NEPHRECTOMY Right 12/16/2021   Procedure: XI ROBOTIC ASSISTED LAPAROSCOPIC RADICAL NEPHRECTOMY;  Surgeon: Sebastian Ache, MD;  Location: [facility];  Service: Urology;  Laterality: Right;  3 HRS   STONE EXTRACTION WITH BASKET  06/12/2011   Procedure: STONE EXTRACTION WITH BASKET;  Surgeon: Garnett Farm, MD;  Location[facility];  Service: Urology;  Laterality: Right;    FAMILY HISTORY: Family History  Problem Relation Age of Onset   Heart disease Mother        Atrial fibrillation   Hypertension Mother    Hyperlipidemia Mother    Lung cancer Father        was a smoker   Cancer Father        lung   Hyperlipidemia Brother    Melanoma Brother    Heart disease Brother        Amyloid   Heart disease Maternal Aunt    Hyperlipidemia Maternal Aunt    Hypertension Maternal Aunt    Stroke Maternal Aunt    Heart disease Paternal Grandmother    Hyperlipidemia Paternal Grandmother    Parkinson's disease Neg Hx    Sleep apnea Neg Hx    Colon cancer Neg Hx    Rectal cancer Neg Hx    Stomach cancer Neg Hx     SOCIAL HISTORY: Social History   Socioeconomic History   Marital status: Married    Spouse name: Dawn   Number of children: 0   Years of education: Not on file   Highest education level: Not on file  Occupational History    Comment: retired  Tobacco Use   Smoking status: Former   Smokeless tobacco: Never  Advertising account planner   Vaping status: Never Used  Substance and Sexual Activity   Alcohol use: No   Drug use: No    Comment: QUIT SMOKING " POT" 40 YRS AGO   Sexual activity: Yes    Partners: Female  Other Topics Concern   Not on file  Social History Narrative   Right-handed.   No caffeine use.   Lives at home with his wife.   Social Determinants of Research scientist (physical sciences)  Strain: Not on file  Food Insecurity: Not on file  Transportation Needs: Not on file  Physical Activity: Not on file  Stress: Not on file  Social Connections: Not on file  Intimate Partner Violence: Not on file      Levert Feinstein, M.D. Ph.D.  [facility] [address] [place], [place] [address] Ph: [phone] Fax: [phone]  CC:  Suanne Marker, MD [address] [place],  [place] [address]  Lucky Cowboy, MD

## 2022-12-28 NOTE — Telephone Encounter (Signed)
Botox start [id number] 100 units CPT 64611 K11.7

## 2023-01-01 NOTE — Telephone Encounter (Signed)
Submitted auth via [employer] portal, status is pending. Request Number [id number]

## 2023-01-04 ENCOUNTER — Ambulatory Visit: Payer: Medicare Other | Attending: Adult Health | Admitting: Physical Therapy

## 2023-01-04 ENCOUNTER — Ambulatory Visit: Payer: Medicare Other

## 2023-01-04 ENCOUNTER — Telehealth: Payer: Self-pay | Admitting: Physical Therapy

## 2023-01-04 DIAGNOSIS — R471 Dysarthria and anarthria: Secondary | ICD-10-CM | POA: Insufficient documentation

## 2023-01-04 DIAGNOSIS — R2689 Other abnormalities of gait and mobility: Secondary | ICD-10-CM | POA: Insufficient documentation

## 2023-01-04 DIAGNOSIS — G20A1 Parkinson's disease without dyskinesia, without mention of fluctuations: Secondary | ICD-10-CM

## 2023-01-04 MED ORDER — ONABOTULINUMTOXINA 100 UNITS IJ SOLR: 100.0000 [IU] | Freq: Once | INTRAMUSCULAR | 0 refills | Status: AC

## 2023-01-04 NOTE — Therapy (Signed)
[place] [facility] [address], [address] [place], [place], [address] Phone: [phone]   Fax:  [phone]  Patient Details  Name: Todd Rice MRN: [id number] Date of Birth: 10-Nov-1955 Referring Provider:  Lucky Cowboy, MD  Encounter Date: 01/04/2023  Physical Therapy Parkinson's Disease Screen   Timed Up and Go test:10.97 sec (compared to 11.21 sec)  10 meter walk test:9.15 sec in 26 sec (2.84 ft/sec) - (compared to 3.72 ft/sec)  5 time sit to stand test:10.88 sec (compared to 10.69 sec)  Patient would benefit from Physical Therapy evaluation due to pt's reported concerns with balance.   Pt notes his balance is unsteady upon first standing up, veering to the left with walking.  Kacey Dysert W., PT 01/04/2023, 11:57 AM  [place] [facility] [address] [place], [place], [address] Phone: [phone]   Fax:  [phone]

## 2023-01-04 NOTE — Telephone Encounter (Signed)
[facility]) Hufstedler was seen for PT, speech therapy screens in our multi-disciplinary clinic.  Physical therapy is recommended for follow up due to his reported continued balance deficits.  If you agree, please send referral via Epic for PT eval and treat.  Thank you.   Lonia Blood, PT 01/04/23 12:38 PM Phone: [phone] Fax: [phone]  [facility] [address] [place], [address] [place], [place] [address] Phone # [phone] Fax # [phone]

## 2023-01-04 NOTE — Therapy (Signed)
[place] [facility] [address] [place], [place], [address] Phone: [phone]   Fax:  [phone]  Patient Details  Name: Todd Rice MRN: [id number] Date of Birth: June 07, 1955 Referring Provider:  Etheleen Mayhew., MD  Encounter Date: 01/04/2023  Speech Therapy Parkinson's Disease Screen   Decibel Level today: low 70sdB  (WNL=70-72 dB) with sound level meter 30cm away from pt's mouth. Pt's conversational volume has remained essentially the same since last treatment course. Pt is not adhering to recommended maintenance HEP of 5 loud /a/ BID.  Pt does not not report difficulty with swallowing, which does not warrant further evaluation.   Pt does does not require speech therapy services at this time. Recommend ST screen in another 6-8 months   [name], CCC-SLP 01/04/2023, 12:05 PM  [place] [facility] [address], [address] [place], [place], [address] Phone: [phone]   Fax:  [phone]

## 2023-01-04 NOTE — Telephone Encounter (Signed)
Rx sent 

## 2023-01-04 NOTE — Telephone Encounter (Signed)
Received approval from [facility]. Please send rx to [facility].  Auth: [id number] (01/01/23-07/04/23)

## 2023-01-04 NOTE — Addendum Note (Signed)
Addended by: Danne Harbor on: 01/04/2023 09:56 AM   Modules accepted: Orders

## 2023-01-09 ENCOUNTER — Encounter: Payer: Self-pay | Admitting: Internal Medicine

## 2023-01-09 NOTE — Progress Notes (Signed)
Future Appointments  Date Time Provider Department  10/06/2022          had      WELLNESS     01/10/2023                      3 mo f/u  11:30 AM Lucky Cowboy, MD [facility]  03/28/2023  3:00 PM Levert Feinstein, MD [facility]  04/02/2023                       cpe  3:00 PM Adela Glimpse, NP [facility]  10/30/2023  8:30 AM Butch Penny, NP [facility]    History of Present Illness:       This very nice 67 y.o. MWM presents for 3 month follow up with HTN, HLD, T2_NIDDM, OSA, Parkinson's Dz (2018) & Vitamin D Deficiency. Patient's Gout is controlled on his Allopurinol /Colchicine. Dr Dohmeier follows patient for OSA on BiPAP and Dr Terrace Arabia  follows patient for Parkinson's (2017). Also CXR in 2017 showed Aortic AS.        Patient is treated for HTN since 1998 & BP has been controlled.  Today's BP is at goal -  130/78 . Patient has had no complaints of any cardiac type chest pain, palpitations, dyspnea / orthopnea / PND, dizziness, claudication, or dependent edema.       Hyperlipidemia is not controlled with diet & Rosuvastatin /Ezetimibe.  Patient denies myalgias or other med SE's. Last Lipids were at goal  except sl elevated Trig's :  Lab Results  Component Value Date   CHOL 94 10/06/2022   HDL 32 (L) 10/06/2022   LDLCALC 36 10/06/2022   TRIG 182 (H) 10/06/2022   CHOLHDL 2.9 10/06/2022     Also, the patient has Morbid Obesity (BMI  39 +) and consequent T2_NIDDM (2013) w/CKD3b  (GFR 41)  and has had no symptoms of reactive hypoglycemia, diabetic polys, paresthesias or visual blurring.  Last A1c was not at goal:  Lab Results  Component Value Date   HGBA1C 6.9 (H) 10/06/2022         Further, the patient also has history of Vitamin D Deficiency ("18" /2010 & "28" /2017) and supplements vitamin D without any suspected side-effects. Last vitamin D was at goal (70-100) :  Lab Results  Component Value Date   VD25OH 78 10/06/2022      Current Outpatient Medications  Medication  Instructions   albuterol HFA inhaler 2 PUFFS EVERY 4 HRS FOR RESCUE   allopurinol  300 MG tablet Take 1 tablet  daily    ALPRAZolam  0.5 MG tablet Take 1 tab up to three times a day    TUMS  500 MG tablet 1 tablet Daily PRN   SINEMET IR 25-250 MG tablet 1 tablet 4 times daily   ezetimibe  10 MG tablet Take  1 tablet  Daily  for Cholesterol   glipiZIDE 5 MG tablet Take 1/2 tab as needed for glucose above 150.   glycopyrrolate  1 mg  2 times daily PRN   Magnesium  250 mg daily with meals   Mentho BIOFREEZE EX 1 Application, Daily PRN   metFORMIN-XR  1,000 mg 2 times daily   polyethylene glycol  17 g Every other day, Alternating days with senna   pramipexole (MIRAPEX)   1 mg  3 times daily   rosuvastatin 20 mg   Daily   Semaglutide,0.25 or 0.5MG /DOS, (OZEMPIC, 0.25 OR 0.5  MG/DOSE,) 2 MG/3ML SOPN Start by injecting 0.25 mg into skin of stomach once weekly; if doing well increase to 0.5 mg in 4 weeks and continue at this dose.   senna (SENOKOT)  2 tablets  Every other day, Alternating days with miralax    sodium chloride 0.65 % SOLN nasal spray 1 spray, Each Nare, As needed   valsartan-hctz  320-25 MG  Take 1 tablet  daily   Vitamin D   2,000 Units  2 times daily    Allergies  Allergen Reactions   Ciprofloxacin Hives   Ceftriaxone Hives     PMHx:   Past Medical History:  Diagnosis Date   Adult BMI 50.0-59.9 kg/sq m    52.77   Anal fissure    Anxiety    Diabetes mellitus without complication (HCC)    borderline   GERD (gastroesophageal reflux disease)    30 years ago, maybe from peptic ulcer   H/O: gout    STABLE   Hepatic steatosis 10/01/11   History of kidney stones    Hyperlipidemia    Hypertension    Kidney tumor    right upper kidney tumor, monitoring   OSA on CPAP    [facility]   Parkinson disease (HCC) 01/04/2016   REM sleep behavior disorder 12/06/2016   Right ureteral stone    Ureteral calculus, right 06/12/2011   Vitamin D deficiency    Weakness      Immunization History  Administered Date(s) Administered   Influenza Inj Mdck Quad  02/22/2017, 01/21/2018, 02/06/2019   Influenza, High Dose 02/18/2020   Influenza 02/03/2013, 02/05/2014   PFIZER Covid-19 Tri-Sucrose Vacc 08/17/2020   PFIZER SARS-COV-2 Vacc 06/14/2019, 07/09/2019, 03/06/2020   Pneumococcal -13 02/18/2020   Pneumococcal-23 11/05/2012   Tdap 03/26/2015     Past Surgical History:  Procedure Laterality Date   ANAL FISSURE REPAIR  10-12-2000   COLONOSCOPY N/A 07/01/2012   Procedure: COLONOSCOPY;  Surgeon: Hart Carwin, MD;  Location: [facility];  Service: Endoscopy;  Laterality: N/A;   CYSTOSCOPY WITH RETROGRADE PYELOGRAM, URETEROSCOPY AND STENT PLACEMENT Bilateral 02/26/2013   Procedure: CYSTOSCOPY WITH RETROGRADE PYELOGRAM, URETEROSCOPY AND STENT PLACEMENT;  Surgeon: Sebastian Ache, MD;  Location: [facility];  Service: Urology;  Laterality: Bilateral;   HOLMIUM LASER APPLICATION Bilateral 02/26/2013   Procedure: HOLMIUM LASER APPLICATION;  Surgeon: Sebastian Ache, MD;  Location: [facility];  Service: Urology;  Laterality: Bilateral;   kidney stone removal     06/2011-stent placement   LEFT MEDIAL FEMORAL CONDYLE DEBRIDEMENT & DRILLING/ REMOVAL LOOSE BODY  09-15-2003   NASAL SEPTUM SURGERY  1985   STONE EXTRACTION WITH BASKET  06/12/2011   Procedure: STONE EXTRACTION WITH BASKET;  Surgeon: Garnett Farm, MD;  Location[facility];  Service: Urology;  Laterality: Right;    FHx:    Reviewed / unchanged   SHx:    Reviewed / unchanged    Systems Review:  Constitutional: Denies fever, chills, wt changes, headaches, insomnia, fatigue, night sweats, change in appetite. Eyes: Denies redness, blurred vision, diplopia, discharge, itchy, watery eyes.  ENT: Denies discharge, congestion, post nasal drip, epistaxis, sore throat, earache, hearing loss, dental pain, tinnitus, vertigo, sinus pain, snoring.  CV: Denies chest pain, palpitations, irregular heartbeat,  syncope, dyspnea, diaphoresis, orthopnea, PND, claudication or edema. Respiratory: denies cough, dyspnea, DOE, pleurisy, hoarseness, laryngitis, wheezing.  Gastrointestinal: Denies dysphagia, odynophagia, heartburn, reflux, water brash, abdominal pain or cramps, nausea, vomiting, bloating, diarrhea, constipation, hematemesis, melena, hematochezia  or hemorrhoids. Genitourinary: Denies  dysuria, frequency, urgency, nocturia, hesitancy, discharge, hematuria or flank pain. Musculoskeletal: Denies arthralgias, myalgias, stiffness, jt. swelling, pain, limping or strain/sprain.  Skin: Denies pruritus, rash, hives, warts, acne, eczema or change in skin lesion(s). Neuro: No weakness, tremor, incoordination, spasms, paresthesia or pain. Psychiatric: Denies confusion, memory loss or sensory loss. Endo: Denies change in weight, skin or hair change.  Heme/Lymph: No excessive bleeding, bruising or enlarged lymph nodes.  Physical Exam  BP 130/78   Pulse 72   Temp 97.8 F (36.6 C)   Ht 5\' 7"  (1.702 m)   Wt 249 lb 6.4 oz (113.1 kg)   SpO2 99%   BMI 39.06 kg/m   Appears  over  nourished  and in no distress.  Eyes: PERRLA, EOMs, conjunctiva no swelling or erythema. Sinuses: No frontal/maxillary tenderness ENT/Mouth: EAC's clear, TM's nl w/o erythema, bulging. Nares clear w/o erythema, swelling, exudates. Oropharynx clear without erythema or exudates. Oral hygiene is good. Tongue normal, non obstructing. Hearing intact.  Neck: Supple. Thyroid not palpable. Car 2+/2+ without bruits, nodes or JVD. Chest: Respirations nl with BS clear & equal w/o rales, rhonchi, wheezing or stridor.  Cor: Heart sounds normal w/ regular rate and rhythm without sig. murmurs, gallops, clicks or rubs. Peripheral pulses normal and equal  without edema.  Abdomen: Soft & bowel sounds normal. Non-tender w/o guarding, rebound, hernias, masses or organomegaly.  Lymphatics: Unremarkable.  Musculoskeletal: Full ROM all peripheral  extremities, joint stability, 5/5 strength and normal  gait with decreased arm swing. No cog wheeling. Skin: Warm, dry without exposed rashes, lesions or ecchymosis apparent.  Neuro: Cranial nerves intact, reflexes equal bilaterally. Sensory-motor testing grossly intact. Tendon reflexes grossly intact.  Pysch: Alert & oriented x 3.  Insight and judgement nl & appropriate. No ideations.  Assessment and Plan:   1. Essential hypertension  - CBC with Differential/Platelet - COMPLETE METABOLIC PANEL WITH GFR - Magnesium - TSH   2. Hyperlipidemia associated with type 2 diabetes mellitus (HCC)  - Lipid panel - TSH  3. Type 2 diabetes mellitus with stage 3b chronic kidney                                                disease, without long-term current use of insulin (HCC)  - Hemoglobin A1c - Insulin, random   4. Vitamin D deficiency  - VITAMIN D 25 Hydroxy    5. Parkinson's disease with dyskinesia   6. Chronic gout  - Uric acid   7. OSA treated with BiPAP   8. Aortic atherosclerosis (HCC) by CXR on 06/10/2018  - Lipid panel   9. Medication management  - CBC with Differential/Platelet - COMPLETE METABOLIC PANEL WITH GFR - Magnesium - Lipid panel - TSH - Hemoglobin A1c - Insulin, random - VITAMIN D 25 Hydroxy  - Uric acid       Discussed  regular exercise, BP monitoring, weight control to achieve/maintain BMI less than 25 and discussed med and SE's. Recommended labs to assess and monitor clinical status with further disposition pending results of labs.  I discussed the assessment and treatment plan with the patient. The patient was provided an opportunity to ask questions and all were answered. The patient agreed with the plan and demonstrated an understanding of the instructions.  I provided over 30 minutes of exam, counseling, chart review and  complex critical decision making.  The patient was advised to call back or seek an in-person evaluation if the  symptoms worsen or if the condition fails to improve as anticipated.   Todd Maw, MD

## 2023-01-09 NOTE — Patient Instructions (Signed)

## 2023-01-10 ENCOUNTER — Encounter: Payer: Self-pay | Admitting: Internal Medicine

## 2023-01-10 ENCOUNTER — Ambulatory Visit (INDEPENDENT_AMBULATORY_CARE_PROVIDER_SITE_OTHER): Payer: Medicare Other | Admitting: Internal Medicine

## 2023-01-10 VITALS — BP 130/78 | HR 72 | Temp 97.8°F | Ht 67.0 in | Wt 249.4 lb

## 2023-01-10 DIAGNOSIS — E559 Vitamin D deficiency, unspecified: Secondary | ICD-10-CM

## 2023-01-10 DIAGNOSIS — M1A9XX0 Chronic gout, unspecified, without tophus (tophi): Secondary | ICD-10-CM

## 2023-01-10 DIAGNOSIS — E1169 Type 2 diabetes mellitus with other specified complication: Secondary | ICD-10-CM | POA: Diagnosis not present

## 2023-01-10 DIAGNOSIS — Z79899 Other long term (current) drug therapy: Secondary | ICD-10-CM

## 2023-01-10 DIAGNOSIS — N1832 Chronic kidney disease, stage 3b: Secondary | ICD-10-CM | POA: Diagnosis not present

## 2023-01-10 DIAGNOSIS — I10 Essential (primary) hypertension: Secondary | ICD-10-CM | POA: Diagnosis not present

## 2023-01-10 DIAGNOSIS — G20B1 Parkinson's disease with dyskinesia, without mention of fluctuations: Secondary | ICD-10-CM

## 2023-01-10 DIAGNOSIS — G4733 Obstructive sleep apnea (adult) (pediatric): Secondary | ICD-10-CM

## 2023-01-10 DIAGNOSIS — I700 Atherosclerosis of aorta: Secondary | ICD-10-CM

## 2023-01-10 DIAGNOSIS — E785 Hyperlipidemia, unspecified: Secondary | ICD-10-CM

## 2023-01-10 DIAGNOSIS — E1122 Type 2 diabetes mellitus with diabetic chronic kidney disease: Secondary | ICD-10-CM

## 2023-01-11 ENCOUNTER — Other Ambulatory Visit: Payer: Self-pay | Admitting: Internal Medicine

## 2023-01-11 ENCOUNTER — Other Ambulatory Visit (HOSPITAL_BASED_OUTPATIENT_CLINIC_OR_DEPARTMENT_OTHER): Payer: Self-pay

## 2023-01-11 DIAGNOSIS — N1832 Chronic kidney disease, stage 3b: Secondary | ICD-10-CM

## 2023-01-11 LAB — COMPLETE METABOLIC PANEL WITH GFR
AG Ratio: 1.3 (calc) (ref 1.0–2.5)
ALT: 9 U/L (ref 9–46)
AST: 11 U/L (ref 10–35)
Albumin: 4.4 g/dL (ref 3.6–5.1)
Alkaline phosphatase (APISO): 63 U/L (ref 35–144)
BUN/Creatinine Ratio: 18 (calc) (ref 6–22)
BUN: 30 mg/dL — ABNORMAL HIGH (ref 7–25)
CO2: 27 mmol/L (ref 20–32)
Calcium: 10.5 mg/dL — ABNORMAL HIGH (ref 8.6–10.3)
Chloride: 97 mmol/L — ABNORMAL LOW (ref 98–110)
Creat: 1.68 mg/dL — ABNORMAL HIGH (ref 0.70–1.35)
Globulin: 3.3 g/dL (ref 1.9–3.7)
Glucose, Bld: 160 mg/dL — ABNORMAL HIGH (ref 65–99)
Potassium: 3.9 mmol/L (ref 3.5–5.3)
Sodium: 136 mmol/L (ref 135–146)
Total Bilirubin: 0.4 mg/dL (ref 0.2–1.2)
Total Protein: 7.7 g/dL (ref 6.1–8.1)
eGFR: 44 mL/min/{1.73_m2} — ABNORMAL LOW (ref >=60)

## 2023-01-11 LAB — CBC WITH DIFFERENTIAL/PLATELET
Absolute Monocytes: 591 {cells}/uL (ref 200–950)
Basophils Absolute: 41 {cells}/uL (ref 0–200)
Basophils Relative: 0.5 %
Eosinophils Absolute: 138 {cells}/uL (ref 15–500)
Eosinophils Relative: 1.7 %
HCT: 45 % (ref 38.5–50.0)
Hemoglobin: 14.9 g/dL (ref 13.2–17.1)
Lymphs Abs: 2179 {cells}/uL (ref 850–3900)
MCH: 28.3 pg (ref 27.0–33.0)
MCHC: 33.1 g/dL (ref 32.0–36.0)
MCV: 85.6 fL (ref 80.0–100.0)
MPV: 11 fL (ref 7.5–12.5)
Monocytes Relative: 7.3 %
Neutro Abs: 5152 {cells}/uL (ref 1500–7800)
Neutrophils Relative %: 63.6 %
Platelets: 195 10*3/uL (ref 140–400)
RBC: 5.26 10*6/uL (ref 4.20–5.80)
RDW: 13.7 % (ref 11.0–15.0)
Total Lymphocyte: 26.9 %
WBC: 8.1 10*3/uL (ref 3.8–10.8)

## 2023-01-11 LAB — LIPID PANEL
Cholesterol: 100 mg/dL (ref <200)
HDL: 33 mg/dL — ABNORMAL LOW (ref >=40)
LDL Cholesterol (Calc): 39 mg/dL
Non-HDL Cholesterol (Calc): 67 mg/dL (ref <130)
Total CHOL/HDL Ratio: 3 (calc) (ref <5.0)
Triglycerides: 224 mg/dL — ABNORMAL HIGH (ref <150)

## 2023-01-11 LAB — TSH: TSH: 4.93 m[IU]/L — ABNORMAL HIGH (ref 0.40–4.50)

## 2023-01-11 LAB — HEMOGLOBIN A1C
Hgb A1c MFr Bld: 7.4 %{Hb} — ABNORMAL HIGH (ref <5.7)
Mean Plasma Glucose: 166 mg/dL
eAG (mmol/L): 9.2 mmol/L

## 2023-01-11 LAB — URIC ACID: Uric Acid, Serum: 4.7 mg/dL (ref 4.0–8.0)

## 2023-01-11 LAB — VITAMIN D 25 HYDROXY (VIT D DEFICIENCY, FRACTURES): Vit D, 25-Hydroxy: 70 ng/mL (ref 30–100)

## 2023-01-11 LAB — INSULIN, RANDOM: Insulin: 68.3 u[IU]/mL — ABNORMAL HIGH

## 2023-01-11 LAB — MAGNESIUM: Magnesium: 1.9 mg/dL (ref 1.5–2.5)

## 2023-01-11 MED ORDER — OZEMPIC (1 MG/DOSE) 4 MG/3ML ~~LOC~~ SOPN
1.0000 mg | PEN_INJECTOR | SUBCUTANEOUS | 0 refills | Status: DC
Start: 2023-01-11 — End: 2023-11-06
  Filled 2023-01-11: qty 3, 28d supply, fill #0

## 2023-01-11 NOTE — Progress Notes (Signed)
<>*<>*<>*<>*<>*<>*<>*<>*<>*<>*<>*<>*<>*<>*<>*<>*<>*<>*<>*<>*<>*<>*<>*<>*<> <>*<>*<>*<>*<>*<>*<>*<>*<>*<>*<>*<>*<>*<>*<>*<>*<>*<>*<>*<>*<>*<>*<>*<>*<>  -Test results slightly outside the reference range are not unusual. If there is anything important, I will review this with you,  otherwise it is considered normal test values.  If you have further questions,  please do not hesitate to contact me at the office or via My Chart.   <>*<>*<>*<>*<>*<>*<>*<>*<>*<>*<>*<>*<>*<>*<>*<>*<>*<>*<>*<>*<>*<>*<>*<>*<> <>*<>*<>*<>*<>*<>*<>*<>*<>*<>*<>*<>*<>*<>*<>*<>*<>*<>*<>*<>*<>*<>*<>*<>*<>  -  Random Glucose = 160 & A1c back up to 7.4% again - - > Increasing                                Ozempic up to 1 mg x 4 doses - sent new Rx to [facility]   - when finish those 4 doses let me know to                                             Increase the dose up to 2 mg to                                                        facilitate weight  loss & better Diabetic control  <>*<>*<>*<>*<>*<>*<>*<>*<>*<>*<>*<>*<>*<>*<>*<>*<>*<>*<>*<>*<>*<>*<>*<>*<>  -  Kidney functions stable at Stage 3b, But                                           - Kidney functions still look a little dehydrated   -  Very important to drink adequate amounts of fluids to prevent permanent damage    - Recommend drink at least 6 bottles (16 ounces) of fluids /water /day = 96 Oz ~100 oz  - 100 oz = 3,000 cc or 3 liters / day  - >> That's 1 &1/2 bottles of a 2 liter soda bottle /day !   - And hopefully this will improve kidney functions  !  <>*<>*<>*<>*<>*<>*<>*<>*<>*<>*<>*<>*<>*<>*<>*<>*<>*<>*<>*<>*<>*<>*<>*<>*<>  -  Chol = 100 & LDL = 39        -               Both  Excellent   - Very low risk for Heart Attack  / Stroke  ^>^>^>^>^>^>^>^>^>^>^>^>^>^>^>^>^>^>^>^>^>^>^>^>^>^>^>^>^>^>^>^>^>^>^>^>^ ^>^>^>^>^>^>^>^>^>^>^>^>^>^>^>^>^>^>^>^>^>^>^>^>^>^>^>^>^>^>^>^>^>^>^>^>^  -  But Triglycerides ( = 224  ) or fats in blood are too high                  (   Ideal or  Goal is less than 150  !  )    - Recommend avoid fried & greasy foods,  sweets / candy,   - Avoid white rice  (brown or wild rice or Quinoa is OK),   - Avoid white potatoes  (sweet potatoes are OK)   - Avoid anything made from white flour  - bagels, doughnuts, rolls, buns, biscuits, white and   wheat breads, pizza crust and traditional  pasta made of white flour & egg white  - (vegetarian pasta or spinach or wheat pasta is OK).    - Multi-grain bread is OK - like multi-grain flat bread or  sandwich thins.   - Avoid alcohol in excess.   - Exercise  is also important.   <>*<>*<>*<>*<>*<>*<>*<>*<>*<>*<>*<>*<>*<>*<>*<>*<>*<>*<>*<>*<>*<>*<>*<>*<>  -  TSH is borderline elevated suggesting thyroid hormone may be borderline low,   but not enough to recommend adding another pill at this time,                                                             So will continue to monitor closely !   <>*<>*<>*<>*<>*<>*<>*<>*<>*<>*<>*<>*<>*<>*<>*<>*<>*<>*<>*<>*<>*<>*<>*<>*<>  -  Vitamin D = 70 - Excellent - Please keep dosage same   <>*<>*<>*<>*<>*<>*<>*<>*<>*<>*<>*<>*<>*<>*<>*<>*<>*<>*<>*<>*<>*<>*<>*<>*<>  -  Uric acid /Gout test is Normal - So continue allopurinol same  <>*<>*<>*<>*<>*<>*<>*<>*<>*<>*<>*<>*<>*<>*<>*<>*<>*<>*<>*<>*<>*<>*<>*<>*<>  -  All Else - CBC - Kidneys - Electrolytes - Liver - Magnesium & Thyroid    - all  Normal / OK <>*<>*<>*<>*<>*<>*<>*<>*<>*<>*<>*<>*<>*<>*<>*<>*<>*<>*<>*<>*<>*<>*<>*<>*<> <>*<>*<>*<>*<>*<>*<>*<>*<>*<>*<>*<>*<>*<>*<>*<>*<>*<>*<>*<>*<>*<>*<>*<>*<>

## 2023-01-12 ENCOUNTER — Other Ambulatory Visit (HOSPITAL_BASED_OUTPATIENT_CLINIC_OR_DEPARTMENT_OTHER): Payer: Self-pay

## 2023-01-17 ENCOUNTER — Other Ambulatory Visit: Payer: Self-pay

## 2023-01-17 ENCOUNTER — Ambulatory Visit: Payer: Medicare Other | Attending: Adult Health | Admitting: Physical Therapy

## 2023-01-17 ENCOUNTER — Encounter: Payer: Self-pay | Admitting: Physical Therapy

## 2023-01-17 DIAGNOSIS — M6281 Muscle weakness (generalized): Secondary | ICD-10-CM | POA: Diagnosis not present

## 2023-01-17 DIAGNOSIS — R29818 Other symptoms and signs involving the nervous system: Secondary | ICD-10-CM | POA: Insufficient documentation

## 2023-01-17 DIAGNOSIS — R2689 Other abnormalities of gait and mobility: Secondary | ICD-10-CM | POA: Diagnosis not present

## 2023-01-17 DIAGNOSIS — R29898 Other symptoms and signs involving the musculoskeletal system: Secondary | ICD-10-CM | POA: Diagnosis not present

## 2023-01-17 DIAGNOSIS — G20A1 Parkinson's disease without dyskinesia, without mention of fluctuations: Secondary | ICD-10-CM | POA: Insufficient documentation

## 2023-01-17 DIAGNOSIS — R293 Abnormal posture: Secondary | ICD-10-CM | POA: Insufficient documentation

## 2023-01-17 DIAGNOSIS — R251 Tremor, unspecified: Secondary | ICD-10-CM | POA: Insufficient documentation

## 2023-01-17 DIAGNOSIS — R278 Other lack of coordination: Secondary | ICD-10-CM | POA: Insufficient documentation

## 2023-01-17 DIAGNOSIS — R2681 Unsteadiness on feet: Secondary | ICD-10-CM | POA: Insufficient documentation

## 2023-01-17 NOTE — Therapy (Signed)
OUTPATIENT PHYSICAL THERAPY NEURO EVALUATION   Patient Name: Todd Rice MRN: [id number] DOB:[birth year], 67 y.o., male Today's Date: 01/18/2023   PCP: Lucky Cowboy, MD REFERRING PROVIDER: Butch Penny, NP  END OF SESSION:  PT End of Session - 01/17/23 0938     Visit Number 1    Number of Visits 9    Date for PT Re-Evaluation 02/16/23    Authorization Type [employer] Medicare    PT Start Time 0934    PT Stop Time 1015    PT Time Calculation (min) 41 min    Activity Tolerance Patient tolerated treatment well    Behavior During Therapy [facility] for tasks assessed/performed             Past Medical History:  Diagnosis Date   Adult BMI 50.0-59.9 kg/sq m    52.77   Anal fissure    Cancer (HCC)    kidney cancer   Diabetes mellitus without complication (HCC)    borderline   Elevated PSA 08/13/2019   H/O: gout    STABLE   Hepatic steatosis 10/01/2011   History of kidney stones    Hyperlipidemia    Hypertension    Kidney tumor    right upper kidney tumor, monitoring   OSA on CPAP    [facility]   Parkinson disease 01/04/2016   REM sleep behavior disorder 12/06/2016   Right ureteral stone    Sleep apnea    Ureteral calculus, right 06/12/2011   Vitamin D deficiency    Weakness    Past Surgical History:  Procedure Laterality Date   ANAL FISSURE REPAIR  10/12/2000   COLONOSCOPY N/A 07/01/2012   Procedure: COLONOSCOPY;  Surgeon: Hart Carwin, MD;  [facility];  Service: Endoscopy;  Laterality: N/A;   COLONOSCOPY  11/23/2022   CYSTOSCOPY WITH RETROGRADE PYELOGRAM, URETEROSCOPY AND STENT PLACEMENT Bilateral 02/26/2013   Procedure: CYSTOSCOPY WITH RETROGRADE PYELOGRAM, URETEROSCOPY AND STENT PLACEMENT;  Surgeon: Sebastian Ache, MD;  [facility];  Service: Urology;  Laterality: Bilateral;   HOLMIUM LASER APPLICATION Bilateral 02/26/2013   Procedure: HOLMIUM LASER APPLICATION;  Surgeon: Sebastian Ache, MD;  Location: [facility];  Service: Urology;   Laterality: Bilateral;   kidney stone removal     06/2011-stent placement   LEFT MEDIAL FEMORAL CONDYLE DEBRIDEMENT & DRILLING/ REMOVAL LOOSE BODY  09/15/2003   NASAL SEPTUM SURGERY  05/09/1983   ROBOT ASSISTED LAPAROSCOPIC NEPHRECTOMY Right 12/16/2021   Procedure: XI ROBOTIC ASSISTED LAPAROSCOPIC RADICAL NEPHRECTOMY;  Surgeon: Sebastian Ache, MD;  Location: [facility];  Service: Urology;  Laterality: Right;  3 HRS   STONE EXTRACTION WITH BASKET  06/12/2011   Procedure: STONE EXTRACTION WITH BASKET;  Surgeon: Garnett Farm, MD;  Location: [facility];  Service: Urology;  Laterality: Right;   Patient Active Problem List   Diagnosis Date Noted   Sialorrhea 12/28/2022   Renal mass 12/16/2021   Constipation 03/24/2021   Gout 03/23/2021   Primary renal papillary carcinoma, 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   Hepatic steatosis 02/18/2020   Major depression in remission (HCC) 11/11/2019   Central sleep apnea due to Cheyne-Stokes respiration 08/31/2019   Hyperlipidemia associated with type 2 diabetes mellitus (HCC) 07/09/2019   Aortic atherosclerosis (HCC) by CXR on 06/10/2018 06/10/2018   Type 2 diabetes mellitus with stage 3a chronic kidney disease, without long-term current use of insulin (HCC) 01/21/2018   OSA treated with BiPAP 01/03/2017   REM sleep behavior disorder 12/06/2016   CKD stage 3 due to type 2  diabetes mellitus (HCC) 10/23/2016   Medication management 10/23/2016   Vitamin D deficiency 10/23/2016   Parkinson's disease 01/04/2016   Seasonal and perennial allergic rhinitis 06/21/2014   Asthma, mild intermittent, well-controlled 06/21/2014   Pseudomonas aeruginosa colonization 10/08/2012   CAD (coronary artery disease) 11/15/2011   Essential hypertension 11/15/2011   Morbid obesity with BMI of 40.0-44.9, adult (HCC) 11/15/2011    ONSET DATE: 01/04/2023 (MD referral and PD screen)  REFERRING DIAG: R26.89 (ICD-10-CM) - Other abnormalities of gait and mobilityG20.A1  (ICD-10-CM) - Parkinson's disease without dyskinesia or fluctuating manifestations  THERAPY DIAG:  Other abnormalities of gait and mobility  Unsteadiness on feet  Other symptoms and signs involving the nervous system  Muscle weakness (generalized)  Rationale for Evaluation and Treatment: Rehabilitation  SUBJECTIVE:                                                                                                                                                                                             SUBJECTIVE STATEMENT: Pt was here for a PD screen approximately 2 weeks ago and feels his balance is off.  Notes balance is more off in the shower, trying to lift up one foot to put on shoes.  Feels his tremor is more on R arm.  Did not get the Botox yet for drooling-sucking on hard candy/ice is helping.  Reports sometimes L foot gets stuck when trying to turn. Pt accompanied by: self  PERTINENT HISTORY: see above  PAIN:  Are you having pain? No and stomach is queasy due to increased dose of Ozempic  PRECAUTIONS: Fall  RED FLAGS: None   WEIGHT BEARING RESTRICTIONS: No  FALLS: Has patient fallen in last 6 months? No  LIVING ENVIRONMENT: Lives with: lives with their spouse Lives in: House/apartment Stairs: Yes: Internal: 12 steps; on left going up Has following equipment at home: Single point cane, Walker - 2 wheeled, and walking poles; walk in shower  PLOF: Independent; walks the dog, otherwise no current exercise program (does some step up and standing balance exercises from previous therapy)  PATIENT GOALS: Pt's goals for therapy are to make sure he doesn't fall, more steady on feet  OBJECTIVE:   DIAGNOSTIC FINDINGS: NA  COGNITION: Overall cognitive status: Within functional limits for tasks assessed   SENSATION: Light touch: WFL  MUSCLE TONE: LLE: Mild  POSTURE: rounded shoulders  LOWER EXTREMITY ROM:   WFL BLE active ROM   LOWER EXTREMITY MMT:  Grossly tested  BLEs 4+/5  TRANSFERS: Assistive device utilized: None  Sit to stand: Complete Independence Stand to sit: Complete Independence  GAIT: Gait pattern: step through pattern,  decreased arm swing- Left, and decreased step length- Right Distance walked: 50 ft Assistive device utilized: None Level of assistance: Modified independence   FUNCTIONAL TESTS:  5 times sit to stand: 11.31 sec Timed up and go (TUG): 12.25 sec 10 meter walk test: 12.16 sec  2.7 ft/sec TUG cognitive: 12.75 sec TUG manual: 12.32 sec MiniBESTest: 25/28  M-CTSIB  Condition 1: Firm Surface, EO 30 Sec, Normal Sway  Condition 2: Firm Surface, EC 30 Sec, Normal Sway  Condition 3: Foam Surface, EO 30 Sec, Normal Sway  Condition 4: Foam Surface, EC 30 Sec, Mild Sway    SLS R:  19.16 sec, L 20 sec (eyes open) R:  2.6 sec, L 7.16 sec (eyes closed)  360 turn R: 9 steps, L:  9 steps   TODAY'S TREATMENT:                                                                                                                              DATEt: 01/17/2023    PATIENT EDUCATION: Education details: Eval results, POC Person educated: Patient Education method: Explanation Education comprehension: verbalized understanding  HOME EXERCISE PROGRAM: Not yet initiated  GOALS: Goals reviewed with patient? Yes  SHORT TERM GOALS: = LTGs   LONG TERM GOALS: Target date: 02/16/2023  Pt will be independent with HEP for improved balance, gait. Baseline:  Goal status: INITIAL  2.  SLS to improve to 10 seconds EO/EC for improved balance for showering. Baseline:  Goal status: INITIAL  3.  360 turns to R and L in 8 steps or less, for improved balance with turns. Baseline:  Goal status: INITIAL  ASSESSMENT:  CLINICAL IMPRESSION: Patient is a 67 y.o. male who was seen today for physical therapy evaluation and treatment for Parkinson's disease.   He specifically reports difficulty with balance in the shower with eyes closed, with  turns, and with SLS activities, such as dressing.  He overall is at decreased risk of falls, except for 360 turn test.  He will benefit from short course of therapy to address high level balance exercises and activities and to update HEP to reflect appropriate balance exercises.  OBJECTIVE IMPAIRMENTS: Abnormal gait and decreased balance.   ACTIVITY LIMITATIONS: standing, bathing, dressing, and locomotion level  PARTICIPATION LIMITATIONS: community activity  PERSONAL FACTORS: 3+ comorbidities: see above  are also affecting patient's functional outcome.   REHAB POTENTIAL: Good  CLINICAL DECISION MAKING: Stable/uncomplicated  EVALUATION COMPLEXITY: Low  PLAN:  PT FREQUENCY: 2x/week  PT DURATION: 4 weeks  PLANNED INTERVENTIONS: Therapeutic exercises, Therapeutic activity, Neuromuscular re-education, Balance training, Gait training, Patient/Family education, and Self Care  PLAN FOR NEXT SESSION: Review current balance HEP and progress balance exercises-SLS, EO, EC, compliant surfaces.  Work on turns   [employer] PT 01/18/2023, 11:37 AM  [facility] at [facility] [address] [place], [address] [place], [place] [address] Phone # [phone] Fax # [phone]

## 2023-01-18 ENCOUNTER — Other Ambulatory Visit (HOSPITAL_BASED_OUTPATIENT_CLINIC_OR_DEPARTMENT_OTHER): Payer: Self-pay

## 2023-01-18 ENCOUNTER — Telehealth: Payer: Self-pay | Admitting: Adult Health

## 2023-01-18 DIAGNOSIS — G20B1 Parkinson's disease with dyskinesia, without mention of fluctuations: Secondary | ICD-10-CM

## 2023-01-18 MED ORDER — COMIRNATY 30 MCG/0.3ML IM SUSY
0.3000 mL | PREFILLED_SYRINGE | Freq: Once | INTRAMUSCULAR | 0 refills | Status: AC
  Filled 2023-01-18: qty 0.3, 1d supply, fill #0

## 2023-01-18 NOTE — Telephone Encounter (Signed)
Pt came into office, states he is needing a referral sent over for Neurorehabilitation.

## 2023-01-18 NOTE — Telephone Encounter (Signed)
I spoke with the patient.  He has physical therapy at the [facility] but he would also like a referral for occupational therapy here at [place] neurorehab [place]. Order sent.

## 2023-01-19 ENCOUNTER — Telehealth: Payer: Self-pay | Admitting: Diagnostic Neuroimaging

## 2023-01-19 NOTE — Telephone Encounter (Signed)
Referral for occupational therapy sent through [facility] to [facility]. [phone].

## 2023-01-23 ENCOUNTER — Ambulatory Visit: Payer: Medicare Other | Admitting: Physical Therapy

## 2023-01-23 ENCOUNTER — Encounter: Payer: Self-pay | Admitting: Physical Therapy

## 2023-01-23 DIAGNOSIS — R2681 Unsteadiness on feet: Secondary | ICD-10-CM

## 2023-01-23 DIAGNOSIS — R2689 Other abnormalities of gait and mobility: Secondary | ICD-10-CM | POA: Diagnosis not present

## 2023-01-23 DIAGNOSIS — R251 Tremor, unspecified: Secondary | ICD-10-CM | POA: Diagnosis not present

## 2023-01-23 DIAGNOSIS — R293 Abnormal posture: Secondary | ICD-10-CM | POA: Diagnosis not present

## 2023-01-23 DIAGNOSIS — R29818 Other symptoms and signs involving the nervous system: Secondary | ICD-10-CM | POA: Diagnosis not present

## 2023-01-23 DIAGNOSIS — R278 Other lack of coordination: Secondary | ICD-10-CM | POA: Diagnosis not present

## 2023-01-23 DIAGNOSIS — M6281 Muscle weakness (generalized): Secondary | ICD-10-CM | POA: Diagnosis not present

## 2023-01-23 DIAGNOSIS — G20A1 Parkinson's disease without dyskinesia, without mention of fluctuations: Secondary | ICD-10-CM | POA: Diagnosis not present

## 2023-01-23 DIAGNOSIS — R29898 Other symptoms and signs involving the musculoskeletal system: Secondary | ICD-10-CM | POA: Diagnosis not present

## 2023-01-23 NOTE — Therapy (Signed)
OUTPATIENT PHYSICAL THERAPY NEURO TREATMENT   Patient Name: Todd Rice MRN: [id number] DOB:November 19, 1955, 67 y.o., male Today's Date: 01/23/2023   PCP: Lucky Cowboy, MD REFERRING PROVIDER: Butch Penny, NP  END OF SESSION:  PT End of Session - 01/23/23 0800     Visit Number 2    Number of Visits 9    Date for PT Re-Evaluation 02/16/23    Authorization Type [employer] Medicare    PT Start Time 0800    PT Stop Time 0842    PT Time Calculation (min) 42 min    Equipment Utilized During Treatment Gait belt    Activity Tolerance Patient tolerated treatment well    Behavior During Therapy WFL for tasks assessed/performed              Past Medical History:  Diagnosis Date   Adult BMI 50.0-59.9 kg/sq m    52.77   Anal fissure    Cancer (HCC)    kidney cancer   Diabetes mellitus without complication (HCC)    borderline   Elevated PSA 08/13/2019   H/O: gout    STABLE   Hepatic steatosis 10/01/2011   History of kidney stones    Hyperlipidemia    Hypertension    Kidney tumor    right upper kidney tumor, monitoring   OSA on CPAP    [employer]   Parkinson disease 01/04/2016   REM sleep behavior disorder 12/06/2016   Right ureteral stone    Sleep apnea    Ureteral calculus, right 06/12/2011   Vitamin D deficiency    Weakness    Past Surgical History:  Procedure Laterality Date   ANAL FISSURE REPAIR  10/12/2000   COLONOSCOPY N/A 07/01/2012   Procedure: COLONOSCOPY;  Surgeon: Hart Carwin, MD;  Location: [facility];  Service: Endoscopy;  Laterality: N/A;   COLONOSCOPY  11/23/2022   CYSTOSCOPY WITH RETROGRADE PYELOGRAM, URETEROSCOPY AND STENT PLACEMENT Bilateral 02/26/2013   Procedure: CYSTOSCOPY WITH RETROGRADE PYELOGRAM, URETEROSCOPY AND STENT PLACEMENT;  Surgeon: Sebastian Ache, MD;  Location: [facility];  Service: Urology;  Laterality: Bilateral;   HOLMIUM LASER APPLICATION Bilateral 02/26/2013   Procedure: HOLMIUM LASER APPLICATION;  Surgeon: Sebastian Ache,  MD;  Location: [facility];  Service: Urology;  Laterality: Bilateral;   kidney stone removal     06/2011-stent placement   LEFT MEDIAL FEMORAL CONDYLE DEBRIDEMENT & DRILLING/ REMOVAL LOOSE BODY  09/15/2003   NASAL SEPTUM SURGERY  05/09/1983   ROBOT ASSISTED LAPAROSCOPIC NEPHRECTOMY Right 12/16/2021   Procedure: XI ROBOTIC ASSISTED LAPAROSCOPIC RADICAL NEPHRECTOMY;  Surgeon: Sebastian Ache, MD;  Location: [facility];  Service: Urology;  Laterality: Right;  3 HRS   STONE EXTRACTION WITH BASKET  06/12/2011   Procedure: STONE EXTRACTION WITH BASKET;  Surgeon: Garnett Farm, MD;  Location: [facility];  Service: Urology;  Laterality: Right;   Patient Active Problem List   Diagnosis Date Noted   Sialorrhea 12/28/2022   Renal mass 12/16/2021   Constipation 03/24/2021   Gout 03/23/2021   Primary renal papillary carcinoma, 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   Hepatic steatosis 02/18/2020   Major depression in remission (HCC) 11/11/2019   Central sleep apnea due to Cheyne-Stokes respiration 08/31/2019   Hyperlipidemia associated with type 2 diabetes mellitus (HCC) 07/09/2019   Aortic atherosclerosis (HCC) by CXR on 06/10/2018 06/10/2018   Type 2 diabetes mellitus with stage 3a chronic kidney disease, without long-term current use of insulin (HCC) 01/21/2018   OSA treated with BiPAP 01/03/2017   REM sleep behavior disorder  12/06/2016   CKD stage 3 due to type 2 diabetes mellitus (HCC) 10/23/2016   Medication management 10/23/2016   Vitamin D deficiency 10/23/2016   Parkinson's disease 01/04/2016   Seasonal and perennial allergic rhinitis 06/21/2014   Asthma, mild intermittent, well-controlled 06/21/2014   Pseudomonas aeruginosa colonization 10/08/2012   CAD (coronary artery disease) 11/15/2011   Essential hypertension 11/15/2011   Morbid obesity with BMI of 40.0-44.9, adult (HCC) 11/15/2011    ONSET DATE: 01/04/2023 (MD referral and PD screen)  REFERRING DIAG: R26.89 (ICD-10-CM) - Other  abnormalities of gait and mobilityG20.A1 (ICD-10-CM) - Parkinson's disease without dyskinesia or fluctuating manifestations  THERAPY DIAG:  Other abnormalities of gait and mobility  Unsteadiness on feet  Rationale for Evaluation and Treatment: Rehabilitation  SUBJECTIVE:                                                                                                                                                                                             SUBJECTIVE STATEMENT: Tweaked my back playing with my dog.   Pt accompanied by: self  PERTINENT HISTORY: see above  PAIN:  Are you having pain? Yes: NPRS scale: 3/10 Pain location: low back Pain description: sore Aggravating factors: playing with dog Relieving factors: heat, time  PRECAUTIONS: Fall  RED FLAGS: None   WEIGHT BEARING RESTRICTIONS: No  FALLS: Has patient fallen in last 6 months? No  LIVING ENVIRONMENT: Lives with: lives with their spouse Lives in: House/apartment Stairs: Yes: Internal: 12 steps; on left going up Has following equipment at home: Single point cane, Walker - 2 wheeled, and walking poles; walk in shower  PLOF: Independent; walks the dog, otherwise no current exercise program (does some step up and standing balance exercises from previous therapy)  PATIENT GOALS: Pt's goals for therapy are to make sure he doesn't fall, more steady on feet  OBJECTIVE:   TODAY'S TREATMENT: 01/23/2023 Activity Comments  NuStep, Level 3>4, 4 extremities x 6 minutes Keeps SPM>100  Review of stretches from previous HEP: -gastroc stretch 2 x 15" -lumbar flexion stretch   Review of previous bout of PT-HEP -Romberg stance EC -Romberg stance EC head turns/nods -On Airex: -marching in place x 10 -alternating forward step taps x 10 -alternating side step taps x 10 -alternating back step taps x 10 Good return demo of HEP-recommended   On Airex: -Feet apart EC head turns/nods -UE diagonals with 2.2# ball -head  turn diagonals 5 reps each   SLS:  Forward step taps, combined to alt UE lifts, at 6" steps; forward step ups x 8 reps Intermittent to light UE support  Access Code: [email or web address] URL: [email or web address] Date: 01/23/2023 Prepared by: [facility] - Outpatient  Rehab - [facility]  Program Notes Standing on your cushion:-marching in place x 10-alternating forward step taps x 10-alternating side step taps x 10-alternating back step taps x 10*Do this standing by the counter for support as needed  Exercises - Romberg Stance Eyes Closed on Foam Pad  - 1 x daily - 5 x weekly - 1 sets - 3 reps - 30 sec hold - Romberg Stance with Head Nods on Foam Pad  - 1 x daily - 5 x weekly - 1 sets - 10 reps - Romberg Stance on Foam Pad with Head Rotation  - 1 x daily - 5 x weekly - 1 sets - 10 reps - Forward and Backward Monster Walk with Resistance at Ankles and Counter Support  - 1 x daily - 5 x weekly - 1 sets - 3-5 reps - Gastroc Stretch on Wall  - 1-2 x daily - 7 x weekly - 1 sets - 3 reps - 30 sec hold - Standing Bilateral Gastroc Stretch with Step  - 1-2 x daily - 7 x weekly - 1 sets - 30 reps - 30 sec hold - Standing Lumbar Spine Flexion Stretch Counter  - 1-2 x daily - 7 x weekly - 1 sets - 5 reps - 15 sec hold  PATIENT EDUCATION: Education details: HEP reminders-to make sure to continue to do 4-5 times per week Person educated: Patient Education method: Explanation, Demonstration, and Handouts Education comprehension: verbalized understanding, returned demonstration, and needs further education  --------------------------------------------- Objective measures below taken at initial evaluation:  DIAGNOSTIC FINDINGS: NA  COGNITION: Overall cognitive status: Within functional limits for tasks assessed   SENSATION: Light touch: WFL  MUSCLE TONE: LLE: Mild  POSTURE: rounded shoulders  LOWER EXTREMITY ROM:   WFL BLE active ROM   LOWER EXTREMITY MMT:  Grossly tested  BLEs 4+/5  TRANSFERS: Assistive device utilized: None  Sit to stand: Complete Independence Stand to sit: Complete Independence  GAIT: Gait pattern: step through pattern, decreased arm swing- Left, and decreased step length- Right Distance walked: 50 ft Assistive device utilized: None Level of assistance: Modified independence   FUNCTIONAL TESTS:  5 times sit to stand: 11.31 sec Timed up and go (TUG): 12.25 sec 10 meter walk test: 12.16 sec  2.7 ft/sec TUG cognitive: 12.75 sec TUG manual: 12.32 sec MiniBESTest: 25/28  M-CTSIB  Condition 1: Firm Surface, EO 30 Sec, Normal Sway  Condition 2: Firm Surface, EC 30 Sec, Normal Sway  Condition 3: Foam Surface, EO 30 Sec, Normal Sway  Condition 4: Foam Surface, EC 30 Sec, Mild Sway    SLS R:  19.16 sec, L 20 sec (eyes open) R:  2.6 sec, L 7.16 sec (eyes closed)  360 turn R: 9 steps, L:  9 steps   TODAY'S TREATMENT:  DATEt: 01/17/2023    PATIENT EDUCATION: Education details: Eval results, POC Person educated: Patient Education method: Explanation Education comprehension: verbalized understanding  HOME EXERCISE PROGRAM: Not yet initiated  GOALS: Goals reviewed with patient? Yes  SHORT TERM GOALS: = LTGs   LONG TERM GOALS: Target date: 02/16/2023  Pt will be independent with HEP for improved balance, gait. Baseline:  Goal status: IN PROGRESS  2.  SLS to improve to 10 seconds EO/EC for improved balance for showering. Baseline:  Goal status: IN PROGRESS  3.  360 turns to R and L in 8 steps or less, for improved balance with turns. Baseline:  Goal status: IN PROGRESS  ASSESSMENT:  CLINICAL IMPRESSION: Skilled PT session today focused on compliant surface balance exercises after aerobic exercise/stretching.  He has increased sway and unsteadiness with EC and feet narrowed, and he has  increased sway with UE motions.  Reviewed previous HEP that was given at previous bout of therapy, and for the most part, this remains challenging.  Recommended pt consistently perform these exercises at least 4-5x/wk to work on balance challenge to carryover for improved balance with shower activities.  OBJECTIVE IMPAIRMENTS: Abnormal gait and decreased balance.   ACTIVITY LIMITATIONS: standing, bathing, dressing, and locomotion level  PARTICIPATION LIMITATIONS: community activity  PERSONAL FACTORS: 3+ comorbidities: see above  are also affecting patient's functional outcome.   REHAB POTENTIAL: Good  CLINICAL DECISION MAKING: Stable/uncomplicated  EVALUATION COMPLEXITY: Low  PLAN:  PT FREQUENCY: 2x/week  PT DURATION: 4 weeks  PLANNED INTERVENTIONS: Therapeutic exercises, Therapeutic activity, Neuromuscular re-education, Balance training, Gait training, Patient/Family education, and Self Care  PLAN FOR NEXT SESSION: Ask how HEP went this week; progress balance exercises-SLS, EO, EC, compliant surfaces.  Work on turns   [employer] PT 01/23/2023, 8:46 AM  [facility] at [facility] [address] [place], [address] [place], [place] [address] Phone # [phone] Fax # [phone]

## 2023-01-26 ENCOUNTER — Ambulatory Visit: Payer: Medicare Other | Admitting: Physical Therapy

## 2023-01-26 ENCOUNTER — Encounter: Payer: Self-pay | Admitting: Physical Therapy

## 2023-01-26 DIAGNOSIS — G20A1 Parkinson's disease without dyskinesia, without mention of fluctuations: Secondary | ICD-10-CM | POA: Diagnosis not present

## 2023-01-26 DIAGNOSIS — M6281 Muscle weakness (generalized): Secondary | ICD-10-CM | POA: Diagnosis not present

## 2023-01-26 DIAGNOSIS — R2689 Other abnormalities of gait and mobility: Secondary | ICD-10-CM | POA: Diagnosis not present

## 2023-01-26 DIAGNOSIS — R251 Tremor, unspecified: Secondary | ICD-10-CM | POA: Diagnosis not present

## 2023-01-26 DIAGNOSIS — R278 Other lack of coordination: Secondary | ICD-10-CM | POA: Diagnosis not present

## 2023-01-26 DIAGNOSIS — R29818 Other symptoms and signs involving the nervous system: Secondary | ICD-10-CM | POA: Diagnosis not present

## 2023-01-26 DIAGNOSIS — R29898 Other symptoms and signs involving the musculoskeletal system: Secondary | ICD-10-CM | POA: Diagnosis not present

## 2023-01-26 DIAGNOSIS — R2681 Unsteadiness on feet: Secondary | ICD-10-CM | POA: Diagnosis not present

## 2023-01-26 DIAGNOSIS — R293 Abnormal posture: Secondary | ICD-10-CM | POA: Diagnosis not present

## 2023-01-26 NOTE — Therapy (Signed)
OUTPATIENT PHYSICAL THERAPY NEURO TREATMENT   Patient Name: Todd Rice MRN: [id number] DOB:04/10/1956, 67 y.o., male Today's Date: 01/26/2023   PCP: Lucky Cowboy, MD REFERRING PROVIDER: Butch Penny, NP  END OF SESSION:  PT End of Session - 01/26/23 0928     Visit Number 3    Number of Visits 9    Date for PT Re-Evaluation 02/16/23    Authorization Type [employer] Medicare    PT Start Time 0929    PT Stop Time 1013    PT Time Calculation (min) 44 min    Equipment Utilized During Treatment Gait belt    Activity Tolerance Patient tolerated treatment well    Behavior During Therapy WFL for tasks assessed/performed               Past Medical History:  Diagnosis Date   Adult BMI 50.0-59.9 kg/sq m    52.77   Anal fissure    Cancer (HCC)    kidney cancer   Diabetes mellitus without complication (HCC)    borderline   Elevated PSA 08/13/2019   H/O: gout    STABLE   Hepatic steatosis 10/01/2011   History of kidney stones    Hyperlipidemia    Hypertension    Kidney tumor    right upper kidney tumor, monitoring   OSA on CPAP    [employer]   Parkinson disease 01/04/2016   REM sleep behavior disorder 12/06/2016   Right ureteral stone    Sleep apnea    Ureteral calculus, right 06/12/2011   Vitamin D deficiency    Weakness    Past Surgical History:  Procedure Laterality Date   ANAL FISSURE REPAIR  10/12/2000   COLONOSCOPY N/A 07/01/2012   Procedure: COLONOSCOPY;  Surgeon: Hart Carwin, MD;  Location: [facility];  Service: Endoscopy;  Laterality: N/A;   COLONOSCOPY  11/23/2022   CYSTOSCOPY WITH RETROGRADE PYELOGRAM, URETEROSCOPY AND STENT PLACEMENT Bilateral 02/26/2013   Procedure: CYSTOSCOPY WITH RETROGRADE PYELOGRAM, URETEROSCOPY AND STENT PLACEMENT;  Surgeon: Sebastian Ache, MD;  Location: [facility];  Service: Urology;  Laterality: Bilateral;   HOLMIUM LASER APPLICATION Bilateral 02/26/2013   Procedure: HOLMIUM LASER APPLICATION;  Surgeon: Sebastian Ache,  MD;  Location: [facility];  Service: Urology;  Laterality: Bilateral;   kidney stone removal     06/2011-stent placement   LEFT MEDIAL FEMORAL CONDYLE DEBRIDEMENT & DRILLING/ REMOVAL LOOSE BODY  09/15/2003   NASAL SEPTUM SURGERY  05/09/1983   ROBOT ASSISTED LAPAROSCOPIC NEPHRECTOMY Right 12/16/2021   Procedure: XI ROBOTIC ASSISTED LAPAROSCOPIC RADICAL NEPHRECTOMY;  Surgeon: Sebastian Ache, MD;  Location: [facility];  Service: Urology;  Laterality: Right;  3 HRS   STONE EXTRACTION WITH BASKET  06/12/2011   Procedure: STONE EXTRACTION WITH BASKET;  Surgeon: Garnett Farm, MD;  Location: [facility];  Service: Urology;  Laterality: Right;   Patient Active Problem List   Diagnosis Date Noted   Sialorrhea 12/28/2022   Renal mass 12/16/2021   Constipation 03/24/2021   Gout 03/23/2021   Primary renal papillary carcinoma, 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   Hepatic steatosis 02/18/2020   Major depression in remission (HCC) 11/11/2019   Central sleep apnea due to Cheyne-Stokes respiration 08/31/2019   Hyperlipidemia associated with type 2 diabetes mellitus (HCC) 07/09/2019   Aortic atherosclerosis (HCC) by CXR on 06/10/2018 06/10/2018   Type 2 diabetes mellitus with stage 3a chronic kidney disease, without long-term current use of insulin (HCC) 01/21/2018   OSA treated with BiPAP 01/03/2017   REM sleep behavior  disorder 12/06/2016   CKD stage 3 due to type 2 diabetes mellitus (HCC) 10/23/2016   Medication management 10/23/2016   Vitamin D deficiency 10/23/2016   Parkinson's disease 01/04/2016   Seasonal and perennial allergic rhinitis 06/21/2014   Asthma, mild intermittent, well-controlled 06/21/2014   Pseudomonas aeruginosa colonization 10/08/2012   CAD (coronary artery disease) 11/15/2011   Essential hypertension 11/15/2011   Morbid obesity with BMI of 40.0-44.9, adult (HCC) 11/15/2011    ONSET DATE: 01/04/2023 (MD referral and PD screen)  REFERRING DIAG: R26.89 (ICD-10-CM) - Other  abnormalities of gait and mobilityG20.A1 (ICD-10-CM) - Parkinson's disease without dyskinesia or fluctuating manifestations  THERAPY DIAG:  Unsteadiness on feet  Other abnormalities of gait and mobility  Rationale for Evaluation and Treatment: Rehabilitation  SUBJECTIVE:                                                                                                                                                                                             SUBJECTIVE STATEMENT: Back is okay.  Did some of the exercises. Pt accompanied by: self  PERTINENT HISTORY: see above  PAIN:  Are you having pain? Yes: NPRS scale: 0/10 Pain location: low back Pain description: sore Aggravating factors: playing with dog Relieving factors: heat, time  PRECAUTIONS: Fall  RED FLAGS: None   WEIGHT BEARING RESTRICTIONS: No  FALLS: Has patient fallen in last 6 months? No  LIVING ENVIRONMENT: Lives with: lives with their spouse Lives in: House/apartment Stairs: Yes: Internal: 12 steps; on left going up Has following equipment at home: Single point cane, Walker - 2 wheeled, and walking poles; walk in shower  PLOF: Independent; walks the dog, otherwise no current exercise program (does some step up and standing balance exercises from previous therapy)  PATIENT GOALS: Pt's goals for therapy are to make sure he doesn't fall, more steady on feet  OBJECTIVE:    TODAY'S TREATMENT: 01/26/2023 Activity Comments  On Airex: Feet apart/together EC head turns/head nods, diagonal head motions Mod sway with feet together  Standing on incline gastroc stretch 2 x 30"  On Airex: -UE diagonals with 2.2# ball x 10 -trunk rotation with 2.2# ball x 10   On Airex: Marching in place-coordinated arm lifts Forward step taps to cones [place] to up on toes PWR! Up squat to upright posture [place] to up on toes, overhead lift with 2.2# ball 3 x 10, lessening UE support  O2 sats 98%, HR 117 bpm   SLS :   foot propped on 6" step: EO/EC head turns/head nods, EC head steady 15 sec   Standing wide BOS lateral weigthshifting, side step and weightshift  x 5 reps each   Quarter turns R and L 5 reps Cues for large step to initiate turns      Access Code: [email or web address] URL: [email or web address] Date: 01/23/2023 Prepared by: [facility] - Outpatient  Rehab - [facility]  Program Notes Standing on your cushion:-marching in place x 10-alternating forward step taps x 10-alternating side step taps x 10-alternating back step taps x 10*Do this standing by the counter for support as needed  Exercises - Romberg Stance Eyes Closed on Foam Pad  - 1 x daily - 5 x weekly - 1 sets - 3 reps - 30 sec hold - Romberg Stance with Head Nods on Foam Pad  - 1 x daily - 5 x weekly - 1 sets - 10 reps - Romberg Stance on Foam Pad with Head Rotation  - 1 x daily - 5 x weekly - 1 sets - 10 reps - Forward and Backward Monster Walk with Resistance at Ankles and Counter Support  - 1 x daily - 5 x weekly - 1 sets - 3-5 reps - Gastroc Stretch on Wall  - 1-2 x daily - 7 x weekly - 1 sets - 3 reps - 30 sec hold - Standing Bilateral Gastroc Stretch with Step  - 1-2 x daily - 7 x weekly - 1 sets - 30 reps - 30 sec hold - Standing Lumbar Spine Flexion Stretch Counter  - 1-2 x daily - 7 x weekly - 1 sets - 5 reps - 15 sec hold  PATIENT EDUCATION: Education details: discussed options for shower safety-adaptive equipment and grab bars Person educated: Patient Education method: Explanation, Demonstration, and Handouts Education comprehension: verbalized understanding, returned demonstration, and needs further education  --------------------------------------------- Objective measures below taken at initial evaluation:  DIAGNOSTIC FINDINGS: NA  COGNITION: Overall cognitive status: Within functional limits for tasks assessed   SENSATION: Light touch: WFL  MUSCLE TONE: LLE: Mild  POSTURE: rounded  shoulders  LOWER EXTREMITY ROM:   WFL BLE active ROM   LOWER EXTREMITY MMT:  Grossly tested BLEs 4+/5  TRANSFERS: Assistive device utilized: None  Sit to stand: Complete Independence Stand to sit: Complete Independence  GAIT: Gait pattern: step through pattern, decreased arm swing- Left, and decreased step length- Right Distance walked: 50 ft Assistive device utilized: None Level of assistance: Modified independence   FUNCTIONAL TESTS:  5 times sit to stand: 11.31 sec Timed up and go (TUG): 12.25 sec 10 meter walk test: 12.16 sec  2.7 ft/sec TUG cognitive: 12.75 sec TUG manual: 12.32 sec MiniBESTest: 25/28  M-CTSIB  Condition 1: Firm Surface, EO 30 Sec, Normal Sway  Condition 2: Firm Surface, EC 30 Sec, Normal Sway  Condition 3: Foam Surface, EO 30 Sec, Normal Sway  Condition 4: Foam Surface, EC 30 Sec, Mild Sway    SLS R:  19.16 sec, L 20 sec (eyes open) R:  2.6 sec, L 7.16 sec (eyes closed)  360 turn R: 9 steps, L:  9 steps   TODAY'S TREATMENT:  DATEt: 01/17/2023    PATIENT EDUCATION: Education details: Eval results, POC Person educated: Patient Education method: Explanation Education comprehension: verbalized understanding  HOME EXERCISE PROGRAM: Not yet initiated  GOALS: Goals reviewed with patient? Yes  SHORT TERM GOALS: = LTGs   LONG TERM GOALS: Target date: 02/16/2023  Pt will be independent with HEP for improved balance, gait. Baseline:  Goal status: IN PROGRESS  2.  SLS to improve to 10 seconds EO/EC for improved balance for showering. Baseline:  Goal status: IN PROGRESS  3.  360 turns to R and L in 8 steps or less, for improved balance with turns. Baseline:  Goal status: IN PROGRESS  ASSESSMENT:  CLINICAL IMPRESSION: Skilled PT session today focused on compliant surface balance plus single limb stance  activities.  Also addressed initiation of stepping/turning, as pt reports he has difficulty with initiating gait and turns at times.  He does well with attention to large step to initiate quarter turn.  With compliant surfaces, he continues to have increased sway with EC and compliant surfaces with narrowed BOS.  He is able to work on lessening UE support, but he has 1-2 episodes of forward LOB, requiring external support to regain balance.  He will continue to benefit from skilled PT towards goals for improved functional mobility and decreased fall risk.    OBJECTIVE IMPAIRMENTS: Abnormal gait and decreased balance.   ACTIVITY LIMITATIONS: standing, bathing, dressing, and locomotion level  PARTICIPATION LIMITATIONS: community activity  PERSONAL FACTORS: 3+ comorbidities: see above  are also affecting patient's functional outcome.   REHAB POTENTIAL: Good  CLINICAL DECISION MAKING: Stable/uncomplicated  EVALUATION COMPLEXITY: Low  PLAN:  PT FREQUENCY: 2x/week  PT DURATION: 4 weeks  PLANNED INTERVENTIONS: Therapeutic exercises, Therapeutic activity, Neuromuscular re-education, Balance training, Gait training, Patient/Family education, and Self Care  PLAN FOR NEXT SESSION: Add quarter turns to HEP.  Progress balance exercises-SLS, EO, EC, compliant surfaces.  Work on turns/initiation of gait   [employer]., PT 01/26/2023, 10:14 AM  [facility] at [facility] [address] [place], [address] [place], [place] [address] Phone # [phone] Fax # [phone]

## 2023-01-30 ENCOUNTER — Ambulatory Visit: Payer: Medicare Other | Admitting: Physical Therapy

## 2023-01-30 ENCOUNTER — Encounter: Payer: Self-pay | Admitting: Physical Therapy

## 2023-01-30 ENCOUNTER — Ambulatory Visit: Payer: Medicare Other | Admitting: Occupational Therapy

## 2023-01-30 DIAGNOSIS — R251 Tremor, unspecified: Secondary | ICD-10-CM | POA: Diagnosis not present

## 2023-01-30 DIAGNOSIS — R29898 Other symptoms and signs involving the musculoskeletal system: Secondary | ICD-10-CM | POA: Diagnosis not present

## 2023-01-30 DIAGNOSIS — R29818 Other symptoms and signs involving the nervous system: Secondary | ICD-10-CM | POA: Diagnosis not present

## 2023-01-30 DIAGNOSIS — R2681 Unsteadiness on feet: Secondary | ICD-10-CM

## 2023-01-30 DIAGNOSIS — G20A1 Parkinson's disease without dyskinesia, without mention of fluctuations: Secondary | ICD-10-CM | POA: Diagnosis not present

## 2023-01-30 DIAGNOSIS — R2689 Other abnormalities of gait and mobility: Secondary | ICD-10-CM | POA: Diagnosis not present

## 2023-01-30 DIAGNOSIS — R293 Abnormal posture: Secondary | ICD-10-CM | POA: Diagnosis not present

## 2023-01-30 DIAGNOSIS — M6281 Muscle weakness (generalized): Secondary | ICD-10-CM | POA: Diagnosis not present

## 2023-01-30 DIAGNOSIS — R278 Other lack of coordination: Secondary | ICD-10-CM | POA: Diagnosis not present

## 2023-01-30 NOTE — Therapy (Signed)
OUTPATIENT PHYSICAL THERAPY NEURO TREATMENT   Patient Name: Todd Rice MRN: [id number] DOB:Apr 19, 1956, 67 y.o., male Today's Date: 01/30/2023   PCP: Lucky Cowboy, MD REFERRING PROVIDER: Butch Penny, NP  END OF SESSION:  PT End of Session - 01/30/23 0752     Visit Number 4    Number of Visits 9    Date for PT Re-Evaluation 02/16/23    Authorization Type [employer] Medicare    PT Start Time 0800    PT Stop Time 0842    PT Time Calculation (min) 42 min    Equipment Utilized During Treatment Gait belt    Activity Tolerance Patient tolerated treatment well    Behavior During Therapy WFL for tasks assessed/performed               Past Medical History:  Diagnosis Date   Adult BMI 50.0-59.9 kg/sq m    52.77   Anal fissure    Cancer (HCC)    kidney cancer   Diabetes mellitus without complication (HCC)    borderline   Elevated PSA 08/13/2019   H/O: gout    STABLE   Hepatic steatosis 10/01/2011   History of kidney stones    Hyperlipidemia    Hypertension    Kidney tumor    right upper kidney tumor, monitoring   OSA on CPAP    [employer] [facility]   Parkinson disease 01/04/2016   REM sleep behavior disorder 12/06/2016   Right ureteral stone    Sleep apnea    Ureteral calculus, right 06/12/2011   Vitamin D deficiency    Weakness    Past Surgical History:  Procedure Laterality Date   ANAL FISSURE REPAIR  10/12/2000   COLONOSCOPY N/A 07/01/2012   Procedure: COLONOSCOPY;  Surgeon: Hart Carwin, MD;  Location: [facility];  Service: Endoscopy;  Laterality: N/A;   COLONOSCOPY  11/23/2022   CYSTOSCOPY WITH RETROGRADE PYELOGRAM, URETEROSCOPY AND STENT PLACEMENT Bilateral 02/26/2013   Procedure: CYSTOSCOPY WITH RETROGRADE PYELOGRAM, URETEROSCOPY AND STENT PLACEMENT;  Surgeon: Sebastian Ache, MD;  Location: [facility];  Service: Urology;  Laterality: Bilateral;   HOLMIUM LASER APPLICATION Bilateral 02/26/2013   Procedure: HOLMIUM LASER APPLICATION;  Surgeon: Sebastian Ache,  MD;  Location: [facility];  Service: Urology;  Laterality: Bilateral;   kidney stone removal     06/2011-stent placement   LEFT MEDIAL FEMORAL CONDYLE DEBRIDEMENT & DRILLING/ REMOVAL LOOSE BODY  09/15/2003   NASAL SEPTUM SURGERY  05/09/1983   ROBOT ASSISTED LAPAROSCOPIC NEPHRECTOMY Right 12/16/2021   Procedure: XI ROBOTIC ASSISTED LAPAROSCOPIC RADICAL NEPHRECTOMY;  Surgeon: Sebastian Ache, MD;  Location: [facility];  Service: Urology;  Laterality: Right;  3 HRS   STONE EXTRACTION WITH BASKET  06/12/2011   Procedure: STONE EXTRACTION WITH BASKET;  Surgeon: Garnett Farm, MD;  Location: [facility];  Service: Urology;  Laterality: Right;   Patient Active Problem List   Diagnosis Date Noted   Sialorrhea 12/28/2022   Renal mass 12/16/2021   Constipation 03/24/2021   Gout 03/23/2021   Primary renal papillary carcinoma, 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   Hepatic steatosis 02/18/2020   Major depression in remission (HCC) 11/11/2019   Central sleep apnea due to Cheyne-Stokes respiration 08/31/2019   Hyperlipidemia associated with type 2 diabetes mellitus (HCC) 07/09/2019   Aortic atherosclerosis (HCC) by CXR on 06/10/2018 06/10/2018   Type 2 diabetes mellitus with stage 3a chronic kidney disease, without long-term current use of insulin (HCC) 01/21/2018   OSA treated with BiPAP 01/03/2017   REM sleep behavior  disorder 12/06/2016   CKD stage 3 due to type 2 diabetes mellitus (HCC) 10/23/2016   Medication management 10/23/2016   Vitamin D deficiency 10/23/2016   Parkinson's disease 01/04/2016   Seasonal and perennial allergic rhinitis 06/21/2014   Asthma, mild intermittent, well-controlled 06/21/2014   Pseudomonas aeruginosa colonization 10/08/2012   CAD (coronary artery disease) 11/15/2011   Essential hypertension 11/15/2011   Morbid obesity with BMI of 40.0-44.9, adult (HCC) 11/15/2011    ONSET DATE: 01/04/2023 (MD referral and PD screen)  REFERRING DIAG: R26.89 (ICD-10-CM) - Other  abnormalities of gait and mobilityG20.A1 (ICD-10-CM) - Parkinson's disease without dyskinesia or fluctuating manifestations  THERAPY DIAG:  Unsteadiness on feet  Other abnormalities of gait and mobility  Rationale for Evaluation and Treatment: Rehabilitation  SUBJECTIVE:                                                                                                                                                                                             SUBJECTIVE STATEMENT: Had a pretty good weekend.   Pt accompanied by: self  PERTINENT HISTORY: see above  PAIN:  Are you having pain? Yes: NPRS scale: 0/10 Pain location: low back Pain description: sore Aggravating factors: playing with dog Relieving factors: heat, time  PRECAUTIONS: Fall  RED FLAGS: None   WEIGHT BEARING RESTRICTIONS: No  FALLS: Has patient fallen in last 6 months? No  LIVING ENVIRONMENT: Lives with: lives with their spouse Lives in: House/apartment Stairs: Yes: Internal: 12 steps; on left going up Has following equipment at home: Single point cane, Walker - 2 wheeled, and walking poles; walk in shower  PLOF: Independent; walks the dog, otherwise no current exercise program (does some step up and standing balance exercises from previous therapy)  PATIENT GOALS: Pt's goals for therapy are to make sure he doesn't fall, more steady on feet  OBJECTIVE:    TODAY'S TREATMENT: 01/30/2023 Activity Comments  NuStep, Level 4>5 intervals x 1 minute, 4 extremities x 8 minutes for warm-up, aerobic activity SPM >90-100 throughout Rates effort level 5/10  Forward/back walking x 2 min   Forward/back tandem gait x 2 min supervision  On Airex: -Feet apart/together EC head turns/head nods -diagonal head motions EC -Heel/toe raises 2 x 10 for ankle strategy -hip strategy 2 x 10 -lateral weightshifting 10 reps   On Airex: -minisquats to up on toes -diagonal reaches with pool noodles -step taps to multiple  cones with 1 UE support           Access Code: [email or web address] URL: [email or web address] Date: 01/23/2023 Prepared by: [facility] - Outpatient  Rehab - [facility]  Program Notes  Standing on your cushion:-marching in place x 10-alternating forward step taps x 10-alternating side step taps x 10-alternating back step taps x 10*Do this standing by the counter for support as needed  Exercises - Romberg Stance Eyes Closed on Foam Pad  - 1 x daily - 5 x weekly - 1 sets - 3 reps - 30 sec hold - Romberg Stance with Head Nods on Foam Pad  - 1 x daily - 5 x weekly - 1 sets - 10 reps - Romberg Stance on Foam Pad with Head Rotation  - 1 x daily - 5 x weekly - 1 sets - 10 reps - Forward and Backward Monster Walk with Resistance at Ankles and Counter Support  - 1 x daily - 5 x weekly - 1 sets - 3-5 reps - Gastroc Stretch on Wall  - 1-2 x daily - 7 x weekly - 1 sets - 3 reps - 30 sec hold - Standing Bilateral Gastroc Stretch with Step  - 1-2 x daily - 7 x weekly - 1 sets - 30 reps - 30 sec hold - Standing Lumbar Spine Flexion Stretch Counter  - 1-2 x daily - 7 x weekly - 1 sets - 5 reps - 15 sec hold  PATIENT EDUCATION: Education details: 01/30/2023-Discussed options for continued community fitness-pt may rejoin Hotel manager; discussed PD symposium Person educated: Patient Education method: Explanation, Demonstration, and Handouts Education comprehension: verbalized understanding, returned demonstration, and needs further education  --------------------------------------------- Objective measures below taken at initial evaluation:  DIAGNOSTIC FINDINGS: NA  COGNITION: Overall cognitive status: Within functional limits for tasks assessed   SENSATION: Light touch: WFL  MUSCLE TONE: LLE: Mild  POSTURE: rounded shoulders  LOWER EXTREMITY ROM:   WFL BLE active ROM   LOWER EXTREMITY MMT:  Grossly tested BLEs 4+/5  TRANSFERS: Assistive device utilized: None  Sit to stand: Complete  Independence Stand to sit: Complete Independence  GAIT: Gait pattern: step through pattern, decreased arm swing- Left, and decreased step length- Right Distance walked: 50 ft Assistive device utilized: None Level of assistance: Modified independence   FUNCTIONAL TESTS:  5 times sit to stand: 11.31 sec Timed up and go (TUG): 12.25 sec 10 meter walk test: 12.16 sec  2.7 ft/sec TUG cognitive: 12.75 sec TUG manual: 12.32 sec MiniBESTest: 25/28  M-CTSIB  Condition 1: Firm Surface, EO 30 Sec, Normal Sway  Condition 2: Firm Surface, EC 30 Sec, Normal Sway  Condition 3: Foam Surface, EO 30 Sec, Normal Sway  Condition 4: Foam Surface, EC 30 Sec, Mild Sway    SLS R:  19.16 sec, L 20 sec (eyes open) R:  2.6 sec, L 7.16 sec (eyes closed)  360 turn R: 9 steps, L:  9 steps   TODAY'S TREATMENT:                                                                                                                              DATEt: 01/17/2023  PATIENT EDUCATION: Education details: Eval results, POC Person educated: Patient Education method: Explanation Education comprehension: verbalized understanding  HOME EXERCISE PROGRAM: Not yet initiated  GOALS: Goals reviewed with patient? Yes  SHORT TERM GOALS: = LTGs   LONG TERM GOALS: Target date: 02/16/2023  Pt will be independent with HEP for improved balance, gait. Baseline:  Goal status: IN PROGRESS  2.  SLS to improve to 10 seconds EO/EC for improved balance for showering. Baseline:  Goal status: IN PROGRESS  3.  360 turns to R and L in 8 steps or less, for improved balance with turns. Baseline:  Goal status: IN PROGRESS  ASSESSMENT:  CLINICAL IMPRESSION: Skilled PT session today focused on strengthening, balance, and compliant surface balance activities.  With compliant surfaces, he continues to have increased sway with EC and compliant surfaces with narrowed BOS.  He continues to benefit from skilled PT towards goals for  improved functional mobility and decreased fall risk.    OBJECTIVE IMPAIRMENTS: Abnormal gait and decreased balance.   ACTIVITY LIMITATIONS: standing, bathing, dressing, and locomotion level  PARTICIPATION LIMITATIONS: community activity  PERSONAL FACTORS: 3+ comorbidities: see above  are also affecting patient's functional outcome.   REHAB POTENTIAL: Good  CLINICAL DECISION MAKING: Stable/uncomplicated  EVALUATION COMPLEXITY: Low  PLAN:  PT FREQUENCY: 2x/week  PT DURATION: 4 weeks  PLANNED INTERVENTIONS: Therapeutic exercises, Therapeutic activity, Neuromuscular re-education, Balance training, Gait training, Patient/Family education, and Self Care  PLAN FOR NEXT SESSION: Work on turns and add quarter turns to [employer].  Progress balance exercises-SLS, EO, EC, compliant surfaces.  Work on initiation of gait   Ceferino Lang W., PT 01/30/2023, 9:41 AM  [facility] at [facility] [address] [place], [address] [place], [place] [address] Phone # [phone] Fax # [phone]

## 2023-02-02 ENCOUNTER — Encounter: Payer: Self-pay | Admitting: Physical Therapy

## 2023-02-02 ENCOUNTER — Ambulatory Visit: Payer: Medicare Other | Admitting: Physical Therapy

## 2023-02-02 DIAGNOSIS — R2689 Other abnormalities of gait and mobility: Secondary | ICD-10-CM

## 2023-02-02 DIAGNOSIS — R29818 Other symptoms and signs involving the nervous system: Secondary | ICD-10-CM

## 2023-02-02 DIAGNOSIS — R251 Tremor, unspecified: Secondary | ICD-10-CM | POA: Diagnosis not present

## 2023-02-02 DIAGNOSIS — M6281 Muscle weakness (generalized): Secondary | ICD-10-CM | POA: Diagnosis not present

## 2023-02-02 DIAGNOSIS — R278 Other lack of coordination: Secondary | ICD-10-CM | POA: Diagnosis not present

## 2023-02-02 DIAGNOSIS — R29898 Other symptoms and signs involving the musculoskeletal system: Secondary | ICD-10-CM | POA: Diagnosis not present

## 2023-02-02 DIAGNOSIS — R293 Abnormal posture: Secondary | ICD-10-CM | POA: Diagnosis not present

## 2023-02-02 DIAGNOSIS — R2681 Unsteadiness on feet: Secondary | ICD-10-CM | POA: Diagnosis not present

## 2023-02-02 DIAGNOSIS — G20A1 Parkinson's disease without dyskinesia, without mention of fluctuations: Secondary | ICD-10-CM | POA: Diagnosis not present

## 2023-02-02 NOTE — Therapy (Signed)
OUTPATIENT PHYSICAL THERAPY NEURO TREATMENT   Patient Name: REMBERTO Rice MRN: [id number] DOB:16-Mar-1956, 67 y.o., male Today's Date: 02/02/2023   PCP: Lucky Cowboy, MD REFERRING PROVIDER: Butch Penny, NP  END OF SESSION:  PT End of Session - 02/02/23 0755     Visit Number 5    Number of Visits 9    Date for PT Re-Evaluation 02/16/23    Authorization Type [employer]HC Medicare    PT Start Time 0759    PT Stop Time 0840    PT Time Calculation (min) 41 min    Equipment Utilized During Treatment Gait belt    Activity Tolerance Patient tolerated treatment well    Behavior During Therapy WFL for tasks assessed/performed                Past Medical History:  Diagnosis Date   Adult BMI 50.0-59.9 kg/sq m    52.77   Anal fissure    Cancer (HCC)    kidney cancer   Diabetes mellitus without complication (HCC)    borderline   Elevated PSA 08/13/2019   H/O: gout    STABLE   Hepatic steatosis 10/01/2011   History of kidney stones    Hyperlipidemia    Hypertension    Kidney tumor    right upper kidney tumor, monitoring   OSA on CPAP    [employer] [facility]   Parkinson disease 01/04/2016   Todd sleep behavior disorder 12/06/2016   Right ureteral stone    Sleep apnea    Ureteral calculus, right 06/12/2011   Vitamin D deficiency    Weakness    Past Surgical History:  Procedure Laterality Date   ANAL FISSURE REPAIR  10/12/2000   COLONOSCOPY N/A 07/01/2012   Procedure: COLONOSCOPY;  Surgeon: Hart Carwin, MD;  Location: [facility];  Service: Endoscopy;  Laterality: N/A;   COLONOSCOPY  11/23/2022   CYSTOSCOPY WITH RETROGRADE PYELOGRAM, URETEROSCOPY AND STENT PLACEMENT Bilateral 02/26/2013   Procedure: CYSTOSCOPY WITH RETROGRADE PYELOGRAM, URETEROSCOPY AND STENT PLACEMENT;  Surgeon: Sebastian Ache, MD;  Location: [facility];  Service: Urology;  Laterality: Bilateral;   HOLMIUM LASER APPLICATION Bilateral 02/26/2013   Procedure: HOLMIUM LASER APPLICATION;  Surgeon: Sebastian Ache, MD;  Location: [facility];  Service: Urology;  Laterality: Bilateral;   kidney stone removal     06/2011-stent placement   LEFT MEDIAL FEMORAL CONDYLE DEBRIDEMENT & DRILLING/ REMOVAL LOOSE BODY  09/15/2003   NASAL SEPTUM SURGERY  05/09/1983   ROBOT ASSISTED LAPAROSCOPIC NEPHRECTOMY Right 12/16/2021   Procedure: XI ROBOTIC ASSISTED LAPAROSCOPIC RADICAL NEPHRECTOMY;  Surgeon: Sebastian Ache, MD;  Location: [facility];  Service: Urology;  Laterality: Right;  3 HRS   STONE EXTRACTION WITH BASKET  06/12/2011   Procedure: STONE EXTRACTION WITH BASKET;  Surgeon: Garnett Farm, MD;  Location: [facility];  Service: Urology;  Laterality: Right;   Patient Active Problem List   Diagnosis Date Noted   Sialorrhea 12/28/2022   Renal mass 12/16/2021   Constipation 03/24/2021   Gout 03/23/2021   Primary renal papillary carcinoma, 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   Hepatic steatosis 02/18/2020   Major depression in remission (HCC) 11/11/2019   Central sleep apnea due to Cheyne-Stokes respiration 08/31/2019   Hyperlipidemia associated with type 2 diabetes mellitus (HCC) 07/09/2019   Aortic atherosclerosis (HCC) by CXR on 06/10/2018 06/10/2018   Type 2 diabetes mellitus with stage 3a chronic kidney disease, without long-term current use of insulin (HCC) 01/21/2018   OSA treated with BiPAP 01/03/2017   Todd sleep  behavior disorder 12/06/2016   CKD stage 3 due to type 2 diabetes mellitus (HCC) 10/23/2016   Medication management 10/23/2016   Vitamin D deficiency 10/23/2016   Parkinson's disease 01/04/2016   Seasonal and perennial allergic rhinitis 06/21/2014   Asthma, mild intermittent, well-controlled 06/21/2014   Pseudomonas aeruginosa colonization 10/08/2012   CAD (coronary artery disease) 11/15/2011   Essential hypertension 11/15/2011   Morbid obesity with BMI of 40.0-44.9, adult (HCC) 11/15/2011    ONSET DATE: 01/04/2023 (MD referral and PD screen)  REFERRING DIAG: R26.89 (ICD-10-CM) - Other  abnormalities of gait and mobilityG20.A1 (ICD-10-CM) - Parkinson's disease without dyskinesia or fluctuating manifestations  THERAPY DIAG:  Unsteadiness on feet  Other symptoms and signs involving the nervous system  Other abnormalities of gait and mobility  Rationale for Evaluation and Treatment: Rehabilitation  SUBJECTIVE:                                                                                                                                                                                             SUBJECTIVE STATEMENT: Nothing new.    Sometimes feel off balance with walking, better with the increase in Sinemet medication.  Feel like the shower balance is better. Pt accompanied by: self  PERTINENT HISTORY: see above  PAIN:  Are you having pain? Yes: NPRS scale: 0/10 Pain location: low back Pain description: sore Aggravating factors: playing with dog Relieving factors: heat, time  PRECAUTIONS: Fall  RED FLAGS: None   WEIGHT BEARING RESTRICTIONS: No  FALLS: Has patient fallen in last 6 months? No  LIVING ENVIRONMENT: Lives with: lives with their spouse Lives in: House/apartment Stairs: Yes: Internal: 12 steps; on left going up Has following equipment at home: Single point cane, Walker - 2 wheeled, and walking poles; walk in shower  PLOF: Independent; walks the dog, otherwise no current exercise program (does some step up and standing balance exercises from previous therapy)  PATIENT GOALS: Pt's goals for therapy are to make sure he doesn't fall, more steady on feet  OBJECTIVE:    TODAY'S TREATMENT: 02/02/2023 Activity Comments  NuStep, Level 4>5 intervals x 1 minute, 4 extremities x 10 minutes for warm-up, aerobic activity SPM >90-100 throughout   Turning techniques with gait: -wide U-turns -Figure-8 turns -narrow BOS weightshifting turns -added dual tasking No LOB  Side step and reach 10 reps each side   Quarter turns-90 and 180 degrees R and L;  progress to quarter turn/short distance walk and turn Carry objects, cues to widen BOS at times  Braiding along counter R and L 3 reps        Access Code: [email or web address] URL:  [email or web address] Date: 02/02/2023 Prepared by: [facility] - Outpatient  Rehab - [facility]  Program Notes Standing on your cushion:-marching in place x 10-alternating forward step taps x 10-alternating side step taps x 10-alternating back step taps x 10*Do this standing by the counter for support as needed  Exercises - Romberg Stance Eyes Closed on Foam Pad  - 1 x daily - 5 x weekly - 1 sets - 3 reps - 30 sec hold - Romberg Stance with Head Nods on Foam Pad  - 1 x daily - 5 x weekly - 1 sets - 10 reps - Romberg Stance on Foam Pad with Head Rotation  - 1 x daily - 5 x weekly - 1 sets - 10 reps - Forward and Backward Monster Walk with Resistance at Ankles and Counter Support  - 1 x daily - 5 x weekly - 1 sets - 3-5 reps - Gastroc Stretch on Wall  - 1-2 x daily - 7 x weekly - 1 sets - 3 reps - 30 sec hold - Standing Bilateral Gastroc Stretch with Step  - 1-2 x daily - 7 x weekly - 1 sets - 30 reps - 30 sec hold - Standing Lumbar Spine Flexion Stretch Counter  - 1-2 x daily - 7 x weekly - 1 sets - 5 reps - 15 sec hold - Standing Quarter Turn with Counter Support  - 1 x daily - 7 x weekly - 1-2 sets - 10 reps     PATIENT EDUCATION: Education details: 02/02/2023-requested info on Aware in Care kit; additions to HEP Person educated: Patient and wife Education method: Explanation, Demonstration, and Handouts Education comprehension: verbalized understanding, returned demonstration, and needs further education  --------------------------------------------- Objective measures below taken at initial evaluation:  DIAGNOSTIC FINDINGS: NA  COGNITION: Overall cognitive status: Within functional limits for tasks assessed   SENSATION: Light touch: WFL  MUSCLE TONE: LLE: Mild  POSTURE: rounded  shoulders  LOWER EXTREMITY ROM:   WFL BLE active ROM   LOWER EXTREMITY MMT:  Grossly tested BLEs 4+/5  TRANSFERS: Assistive device utilized: None  Sit to stand: Complete Independence Stand to sit: Complete Independence  GAIT: Gait pattern: step through pattern, decreased arm swing- Left, and decreased step length- Right Distance walked: 50 ft Assistive device utilized: None Level of assistance: Modified independence   FUNCTIONAL TESTS:  5 times sit to stand: 11.31 sec Timed up and go (TUG): 12.25 sec 10 meter walk test: 12.16 sec  2.7 ft/sec TUG cognitive: 12.75 sec TUG manual: 12.32 sec MiniBESTest: 25/28  M-CTSIB  Condition 1: Firm Surface, EO 30 Sec, Normal Sway  Condition 2: Firm Surface, EC 30 Sec, Normal Sway  Condition 3: Foam Surface, EO 30 Sec, Normal Sway  Condition 4: Foam Surface, EC 30 Sec, Mild Sway    SLS R:  19.16 sec, L 20 sec (eyes open) R:  2.6 sec, L 7.16 sec (eyes closed)  360 turn R: 9 steps, L:  9 steps   TODAY'S TREATMENT:  DATEt: 01/17/2023    PATIENT EDUCATION: Education details: Eval results, POC Person educated: Patient Education method: Explanation Education comprehension: verbalized understanding  HOME EXERCISE PROGRAM: Not yet initiated  GOALS: Goals reviewed with patient? Yes  SHORT TERM GOALS: = LTGs   LONG TERM GOALS: Target date: 02/16/2023  Pt will be independent with HEP for improved balance, gait. Baseline:  Goal status: IN PROGRESS  2.  SLS to improve to 10 seconds EO/EC for improved balance for showering. Baseline:  Goal status: IN PROGRESS  3.  360 turns to R and L in 8 steps or less, for improved balance with turns. Baseline:  Goal status: IN PROGRESS  ASSESSMENT:  CLINICAL IMPRESSION: Skilled PT session today focused on gait and dynamic balance with turns and changes of  direction.  He does well with quarter turns and wider BOS for turning activities.  Ended session with braiding activity, with slowed pace and slight imbalance with crossover stepping.  He will benefit from additional practice with this exercise, as a means to help with balance recovery if needed.  He does report he is feeling more balanced in the shower.  He continues to benefit from skilled PT towards goals for improved functional mobility and decreased fall risk.    OBJECTIVE IMPAIRMENTS: Abnormal gait and decreased balance.   ACTIVITY LIMITATIONS: standing, bathing, dressing, and locomotion level  PARTICIPATION LIMITATIONS: community activity  PERSONAL FACTORS: 3+ comorbidities: see above  are also affecting patient's functional outcome.   REHAB POTENTIAL: Good  CLINICAL DECISION MAKING: Stable/uncomplicated  EVALUATION COMPLEXITY: Low  PLAN:  PT FREQUENCY: 2x/week  PT DURATION: 4 weeks  PLANNED INTERVENTIONS: Therapeutic exercises, Therapeutic activity, Neuromuscular re-education, Balance training, Gait training, Patient/Family education, and Self Care  PLAN FOR NEXT SESSION: Continue work on turns and add quarter turns to [employer].  Progress balance exercises-SLS, EO, EC, compliant surfaces.  Continue to work on initiation of gait   Deema Juncaj W., PT 02/02/2023, 8:47 AM  [facility] at [facility] [address] [place], [address] [place], [place] [address] Phone # [phone] Fax # [phone]

## 2023-02-04 NOTE — Therapy (Signed)
OUTPATIENT OCCUPATIONAL THERAPY PARKINSON'S EVALUATION  Patient Name: Todd Rice MRN: [id number] DOB:02/27/1956, 67 y.o., male Today's Date: 02/05/2023  PCP: Dr. Lucky Cowboy REFERRING PROVIDER: Dr. Joycelyn Schmid  END OF SESSION:  OT End of Session - 02/05/23 1007     Visit Number 1    Number of Visits 1    Date for OT Re-Evaluation --   eval only   OT Start Time 0808    OT Stop Time 0855    OT Time Calculation (min) 47 min    Activity Tolerance Patient tolerated treatment well    Behavior During Therapy [facility] for tasks assessed/performed              Past Medical History:  Diagnosis Date   Adult BMI 50.0-59.9 kg/sq m    52.77   Anal fissure    Cancer (HCC)    kidney cancer   Diabetes mellitus without complication (HCC)    borderline   Elevated PSA 08/13/2019   H/O: gout    STABLE   Hepatic steatosis 10/01/2011   History of kidney stones    Hyperlipidemia    Hypertension    Kidney tumor    right upper kidney tumor, monitoring   OSA on CPAP    [employer] [facility]   Parkinson disease 01/04/2016   REM sleep behavior disorder 12/06/2016   Right ureteral stone    Sleep apnea    Ureteral calculus, right 06/12/2011   Vitamin D deficiency    Weakness    Past Surgical History:  Procedure Laterality Date   ANAL FISSURE REPAIR  10/12/2000   COLONOSCOPY N/A 07/01/2012   Procedure: COLONOSCOPY;  Surgeon: Hart Carwin, MD;  Location: [facility];  Service: Endoscopy;  Laterality: N/A;   COLONOSCOPY  11/23/2022   CYSTOSCOPY WITH RETROGRADE PYELOGRAM, URETEROSCOPY AND STENT PLACEMENT Bilateral 02/26/2013   Procedure: CYSTOSCOPY WITH RETROGRADE PYELOGRAM, URETEROSCOPY AND STENT PLACEMENT;  Surgeon: Sebastian Ache, MD;  Location: [facility];  Service: Urology;  Laterality: Bilateral;   HOLMIUM LASER APPLICATION Bilateral 02/26/2013   Procedure: HOLMIUM LASER APPLICATION;  Surgeon: Sebastian Ache, MD;  Location: [facility];  Service: Urology;  Laterality: Bilateral;    kidney stone removal     06/2011-stent placement   LEFT MEDIAL FEMORAL CONDYLE DEBRIDEMENT & DRILLING/ REMOVAL LOOSE BODY  09/15/2003   NASAL SEPTUM SURGERY  05/09/1983   ROBOT ASSISTED LAPAROSCOPIC NEPHRECTOMY Right 12/16/2021   Procedure: XI ROBOTIC ASSISTED LAPAROSCOPIC RADICAL NEPHRECTOMY;  Surgeon: Sebastian Ache, MD;  Location: [facility];  Service: Urology;  Laterality: Right;  3 HRS   STONE EXTRACTION WITH BASKET  06/12/2011   Procedure: STONE EXTRACTION WITH BASKET;  Surgeon: Garnett Farm, MD;  Location: [facility];  Service: Urology;  Laterality: Right;   Patient Active Problem List   Diagnosis Date Noted   Sialorrhea 12/28/2022   Renal mass 12/16/2021   Constipation 03/24/2021   Gout 03/23/2021   Primary renal papillary carcinoma, 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   Hepatic steatosis 02/18/2020   Major depression in remission (HCC) 11/11/2019   Central sleep apnea due to Cheyne-Stokes respiration 08/31/2019   Hyperlipidemia associated with type 2 diabetes mellitus (HCC) 07/09/2019   Aortic atherosclerosis (HCC) by CXR on 06/10/2018 06/10/2018   Type 2 diabetes mellitus with stage 3a chronic kidney disease, without long-term current use of insulin (HCC) 01/21/2018   OSA treated with BiPAP 01/03/2017   REM sleep behavior disorder 12/06/2016   CKD stage 3 due to type 2 diabetes mellitus (HCC) 10/23/2016  Medication management 10/23/2016   Vitamin D deficiency 10/23/2016   Parkinson's disease 01/04/2016   Seasonal and perennial allergic rhinitis 06/21/2014   Asthma, mild intermittent, well-controlled 06/21/2014   Pseudomonas aeruginosa colonization 10/08/2012   CAD (coronary artery disease) 11/15/2011   Essential hypertension 11/15/2011   Morbid obesity with BMI of 40.0-44.9, adult (HCC) 11/15/2011    ONSET DATE: 01/18/23 (referral date)  REFERRING DIAG: Parkinson's Disease  THERAPY DIAG:  Other symptoms and signs involving the nervous system  Tremor  Other  symptoms and signs involving the musculoskeletal system  Other lack of coordination  Unsteadiness on feet  Abnormal posture  Rationale for Evaluation and Treatment: Rehabilitation  SUBJECTIVE:   SUBJECTIVE STATEMENT: "I still have trouble with my hands and fingers"     Pt accompanied by: self  PERTINENT HISTORY:  Pt returns today for 6-46month re-evaluation as recommended when last discharged from occupational therapy 06/26/22.  Pt reports that his PD medication was incr to 4x day in June due to wearing off periods.  PMH includes:  CAD, HTN, HDL, obesity, asthma, DM, CKD, vitamin D deficiency, REM sleep disorder, OSA, aortic atherosclerosis, hx of gout, GERD, hx of anxiety, tumor on R kidney s/p nephrectomy (CA--surgery only)  PRECAUTIONS: Fall  WEIGHT BEARING RESTRICTIONS: No  PAIN:  Are you having pain? No  FALLS: Has patient fallen in last 6 months? No  LIVING ENVIRONMENT: Lives with: lives with their family and lives with their spouse Lives in: House/apartment  PLOF: Independent, Vocation/Vocational requirements: retired, and Leisure: walking, HEP  PATIENT GOALS:  improve hands  OBJECTIVE:   HAND DOMINANCE: Right  ADLs:  Transfers/ambulation related to ADLs:  min difficulty with bed mobility (mornings) Eating: occasional assist with cutting meat, difficulty opening unsealed bottle at times (use both hands), opening packages at times  Grooming: mod I UB Dressing: mod I, slow, doesn't wear buttons LB Dressing: mod I, slow, has long handled shoe horn Toileting: mod I Bathing: mod I Tub Shower transfers: mod I (no grab bar/seat), some balance difficulties with closing eyes to wash face.   IADLs: Shopping: mod I Light housekeeping: wife does most, mod I for tasks performed  Meal Prep: wife does most, mod I for tasks performed Community mobility:  hugging L side more, no new changes  Medication management: independent Financial management: wife has always  performed  Handwriting: 100% legible and Mild micrographia, printing for 2 sentences.     MOBILITY STATUS: Independent  Currently in PT  POSTURE COMMENTS:  rounded shoulders  ACTIVITY TOLERANCE: Activity tolerance: denies difficulty  FUNCTIONAL OUTCOME MEASURES: Standing functional reach: Right: 11 inches; Left: 12 inches Fastening/unfastening 3 buttons: 32.35sec  Physical performance test: PPT#2 (simulated eating) 16.37 (holds spoon grossly) & PPT#4 (donning/doffing jacket): 18.59sec  COORDINATION: 9 Hole Peg test: Right: 33.69 sec; Left: 33.65sec with with trunk/shoulder compensation noted with each hand (decr in-hand manipulation) Box and Blocks:  Right 46 blocks, Left 46 blocks Tremors: Resting, Right, and Left, mild  UE ROM:   Bilateral shoulder flex R-150* with elbow ext WNL, L-150* with elbow ext WNL , mild decr supination bilaterally R>L and mild decr R MP ext  Otherwise bilateral UE ROM grossly WNL (no decline)  UE MMT:   Not tested  SENSATION: Not tested Pt reports numbness intermittently in R 2nd   MUSCLE TONE: RUE: Mild and Rigidity and LUE: Mild and Rigidity  COGNITION: Overall cognitive status:  bradyphrenia with word recall  VISION:  no changes   OBSERVATIONS: Bradykinesia   TODAY'S  TREATMENT:                                                                                                                              Evaluation completed today.     PATIENT EDUCATION: Education details: OT eval results, Recommendation for re-eval in approx 6-8 months, discussed that pt may benefit from grab bar in shower  Person educated: Patient Education method: Explanation Education comprehension: verbalized understanding  HOME EXERCISE PROGRAM: Evaluation only today--pt to continue with previous HEP   ASSESSMENT:  CLINICAL IMPRESSION: Patient is a 67 y.o. male who was seen today for occupational therapy evaluation for Parkinson's Disease.  Pt is familiar to  this therapist and was last seen for OT 06/2022 and it recommended at discharge that pt return for re-evaluation in 6-8 months.  Pt with PMH that includes:  CAD, HTN, HDL, obesity, asthma, DM, CKD, vitamin D deficiency, REM sleep disorder, OSA, aortic atherosclerosis, hx of gout, GERD, hx of anxiety, tumor on R kidney s/p nephrectomy.  Pt presents today with bradykinesia, rigidity, decr coordination, tremor, abnormal posture, decr balance for ADLs.  However, pt with no significant functional change in ADLs/IADLs at this time and functional measures approximate or are improved from when last seen.  Therefore, no occupational therapy is recommended at this time.  Pt will continue with current HEP.  Also recommended re-evaluation in approx 6-8 months.  PERFORMANCE DEFICITS: in functional skills including ADLs, IADLs, coordination, dexterity, tone, ROM, strength, flexibility, Fine motor control, Gross motor control, mobility, balance, and UE functional use, cognitive skills including  bradyphrenia .  IMPAIRMENTS: are limiting patient from ADLs, IADLs, and leisure.   COMORBIDITIES:  may have co-morbidities  that affects occupational performance.   MODIFICATION OR ASSISTANCE TO COMPLETE EVALUATION: No modification of tasks or assist necessary to complete an evaluation.  OT OCCUPATIONAL PROFILE AND HISTORY: Detailed assessment: Review of records and additional review of physical, cognitive, psychosocial history related to current functional performance.  CLINICAL DECISION MAKING: LOW - limited treatment options, no task modification necessary  EVALUATION COMPLEXITY: Low    PLAN:  OT FREQUENCY: eval only  OT DURATION: eval only  PLANNED INTERVENTIONS: self care/ADL training, ultrasound, and patient/family education  RECOMMENDED OTHER SERVICES: none at this time  CONSULTED AND AGREED WITH PLAN OF CARE: Patient  PLAN FOR NEXT SESSION: re-evaluation in approx 6-71months   Adamae Ricklefs,  OTR/L 02/05/2023, 1:08 PM

## 2023-02-05 ENCOUNTER — Encounter: Payer: Self-pay | Admitting: Occupational Therapy

## 2023-02-05 ENCOUNTER — Ambulatory Visit: Payer: Medicare Other | Admitting: Occupational Therapy

## 2023-02-05 DIAGNOSIS — R293 Abnormal posture: Secondary | ICD-10-CM | POA: Diagnosis not present

## 2023-02-05 DIAGNOSIS — R29898 Other symptoms and signs involving the musculoskeletal system: Secondary | ICD-10-CM

## 2023-02-05 DIAGNOSIS — R251 Tremor, unspecified: Secondary | ICD-10-CM | POA: Diagnosis not present

## 2023-02-05 DIAGNOSIS — R29818 Other symptoms and signs involving the nervous system: Secondary | ICD-10-CM | POA: Diagnosis not present

## 2023-02-05 DIAGNOSIS — R2681 Unsteadiness on feet: Secondary | ICD-10-CM | POA: Diagnosis not present

## 2023-02-05 DIAGNOSIS — M6281 Muscle weakness (generalized): Secondary | ICD-10-CM | POA: Diagnosis not present

## 2023-02-05 DIAGNOSIS — R2689 Other abnormalities of gait and mobility: Secondary | ICD-10-CM | POA: Diagnosis not present

## 2023-02-05 DIAGNOSIS — R278 Other lack of coordination: Secondary | ICD-10-CM

## 2023-02-05 DIAGNOSIS — G20A1 Parkinson's disease without dyskinesia, without mention of fluctuations: Secondary | ICD-10-CM | POA: Diagnosis not present

## 2023-02-06 ENCOUNTER — Ambulatory Visit: Payer: Medicare Other | Attending: Adult Health | Admitting: Physical Therapy

## 2023-02-06 ENCOUNTER — Encounter: Payer: Self-pay | Admitting: Physical Therapy

## 2023-02-06 DIAGNOSIS — R2681 Unsteadiness on feet: Secondary | ICD-10-CM | POA: Diagnosis not present

## 2023-02-06 DIAGNOSIS — R2689 Other abnormalities of gait and mobility: Secondary | ICD-10-CM | POA: Diagnosis not present

## 2023-02-06 DIAGNOSIS — R29818 Other symptoms and signs involving the nervous system: Secondary | ICD-10-CM | POA: Insufficient documentation

## 2023-02-06 NOTE — Therapy (Signed)
OUTPATIENT PHYSICAL THERAPY NEURO TREATMENT   Patient Name: Todd Rice MRN: [id number] DOB:Aug 18, 1955, 67 y.o., male Today's Date: 02/06/2023   PCP: Lucky Cowboy, MD REFERRING PROVIDER: Butch Penny, NP  END OF SESSION:  PT End of Session - 02/06/23 0754     Visit Number 6    Number of Visits 9    Date for PT Re-Evaluation 02/16/23    Authorization Type [employer] Medicare    PT Start Time 0758    PT Stop Time 0840    PT Time Calculation (min) 42 min    Equipment Utilized During Treatment Gait belt    Activity Tolerance Patient tolerated treatment well    Behavior During Therapy WFL for tasks assessed/performed                 Past Medical History:  Diagnosis Date   Adult BMI 50.0-59.9 kg/sq m    52.77   Anal fissure    Cancer (HCC)    kidney cancer   Diabetes mellitus without complication (HCC)    borderline   Elevated PSA 08/13/2019   H/O: gout    STABLE   Hepatic steatosis 10/01/2011   History of kidney stones    Hyperlipidemia    Hypertension    Kidney tumor    right upper kidney tumor, monitoring   OSA on CPAP    [employer] [facility]   Parkinson disease (HCC) 01/04/2016   REM sleep behavior disorder 12/06/2016   Right ureteral stone    Sleep apnea    Ureteral calculus, right 06/12/2011   Vitamin D deficiency    Weakness    Past Surgical History:  Procedure Laterality Date   ANAL FISSURE REPAIR  10/12/2000   COLONOSCOPY N/A 07/01/2012   Procedure: COLONOSCOPY;  Surgeon: Hart Carwin, MD;  [facility];  Service: Endoscopy;  Laterality: N/A;   COLONOSCOPY  11/23/2022   CYSTOSCOPY WITH RETROGRADE PYELOGRAM, URETEROSCOPY AND STENT PLACEMENT Bilateral 02/26/2013   Procedure: CYSTOSCOPY WITH RETROGRADE PYELOGRAM, URETEROSCOPY AND STENT PLACEMENT;  Surgeon: Sebastian Ache, MD;  Location: [facility];  Service: Urology;  Laterality: Bilateral;   HOLMIUM LASER APPLICATION Bilateral 02/26/2013   Procedure: HOLMIUM LASER APPLICATION;  Surgeon:  Sebastian Ache, MD;  Location: [facility];  Service: Urology;  Laterality: Bilateral;   kidney stone removal     06/2011-stent placement   LEFT MEDIAL FEMORAL CONDYLE DEBRIDEMENT & DRILLING/ REMOVAL LOOSE BODY  09/15/2003   NASAL SEPTUM SURGERY  05/09/1983   ROBOT ASSISTED LAPAROSCOPIC NEPHRECTOMY Right 12/16/2021   Procedure: XI ROBOTIC ASSISTED LAPAROSCOPIC RADICAL NEPHRECTOMY;  Surgeon: Sebastian Ache, MD;  Location: [facility];  Service: Urology;  Laterality: Right;  3 HRS   STONE EXTRACTION WITH BASKET  06/12/2011   Procedure: STONE EXTRACTION WITH BASKET;  Surgeon: Garnett Farm, MD;  Location: [facility];  Service: Urology;  Laterality: Right;   Patient Active Problem List   Diagnosis Date Noted   Sialorrhea 12/28/2022   Renal mass 12/16/2021   Constipation 03/24/2021   Gout 03/23/2021   Primary renal papillary carcinoma, 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   Hepatic steatosis 02/18/2020   Major depression in remission (HCC) 11/11/2019   Central sleep apnea due to Cheyne-Stokes respiration 08/31/2019   Hyperlipidemia associated with type 2 diabetes mellitus (HCC) 07/09/2019   Aortic atherosclerosis (HCC) by CXR on 06/10/2018 06/10/2018   Type 2 diabetes mellitus with stage 3a chronic kidney disease, without long-term current use of insulin (HCC) 01/21/2018   OSA treated with BiPAP 01/03/2017  REM sleep behavior disorder 12/06/2016   CKD stage 3 due to type 2 diabetes mellitus (HCC) 10/23/2016   Medication management 10/23/2016   Vitamin D deficiency 10/23/2016   Parkinson's disease (HCC) 01/04/2016   Seasonal and perennial allergic rhinitis 06/21/2014   Asthma, mild intermittent, well-controlled 06/21/2014   Pseudomonas aeruginosa colonization 10/08/2012   CAD (coronary artery disease) 11/15/2011   Essential hypertension 11/15/2011   Morbid obesity with BMI of 40.0-44.9, adult (HCC) 11/15/2011    ONSET DATE: 01/04/2023 (MD referral and PD screen)  REFERRING DIAG: R26.89  (ICD-10-CM) - Other abnormalities of gait and mobilityG20.A1 (ICD-10-CM) - Parkinson's disease without dyskinesia or fluctuating manifestations  THERAPY DIAG:  Unsteadiness on feet  Other symptoms and signs involving the nervous system  Other abnormalities of gait and mobility  Rationale for Evaluation and Treatment: Rehabilitation  SUBJECTIVE:                                                                                                                                                                                             SUBJECTIVE STATEMENT: Went for OT eval yesterday.  OT not recommended right now. Pt accompanied by: self  PERTINENT HISTORY: see above  PAIN:  Are you having pain? Yes: NPRS scale: 0/10 Pain location: low back Pain description: sore Aggravating factors: playing with dog Relieving factors: heat, time  PRECAUTIONS: Fall  RED FLAGS: None   WEIGHT BEARING RESTRICTIONS: No  FALLS: Has patient fallen in last 6 months? No  LIVING ENVIRONMENT: Lives with: lives with their spouse Lives in: House/apartment Stairs: Yes: Internal: 12 steps; on left going up Has following equipment at home: Single point cane, Walker - 2 wheeled, and walking poles; walk in shower  PLOF: Independent; walks the dog, otherwise no current exercise program (does some step up and standing balance exercises from previous therapy)  PATIENT GOALS: Pt's goals for therapy are to make sure he doesn't fall, more steady on feet  OBJECTIVE:       TODAY'S TREATMENT: 02/06/2023 Activity Comments  NuStep, Level 4>5 intervals x 1 minute, 4 extremities x 10 minutes for warm-up, aerobic activity SPM >90-100 throughout   Turning techniques with gait: -narrow BOS weightshifting turns -added dual tasking No LOB  Side step and reach 10 reps each side, then side step/weigthshift on blance beam x 10 reps   On balance beam: -heel/toe raises 2 x 10 -hamstring curls, 2 x 10 -SLS 3 x 10  sec -back step and weightshift, 2 x10 Intermittent UE support     Reviewed quarter turns-90 and 180 degrees R and L; progress to quarter turn/short distance walk and turn,  squatting activity Carry objects, cues to widen BOS at times  Braiding along counter R and L 3 reps        Access Code: [email or web address] URL: [email or web address] Date: 02/02/2023 Prepared by: [facility] - Outpatient  Rehab - [facility]  Program Notes Standing on your cushion:-marching in place x 10-alternating forward step taps x 10-alternating side step taps x 10-alternating back step taps x 10*Do this standing by the counter for support as needed  Exercises - Romberg Stance Eyes Closed on Foam Pad  - 1 x daily - 5 x weekly - 1 sets - 3 reps - 30 sec hold - Romberg Stance with Head Nods on Foam Pad  - 1 x daily - 5 x weekly - 1 sets - 10 reps - Romberg Stance on Foam Pad with Head Rotation  - 1 x daily - 5 x weekly - 1 sets - 10 reps - Forward and Backward Monster Walk with Resistance at Ankles and Counter Support  - 1 x daily - 5 x weekly - 1 sets - 3-5 reps - Gastroc Stretch on Wall  - 1-2 x daily - 7 x weekly - 1 sets - 3 reps - 30 sec hold - Standing Bilateral Gastroc Stretch with Step  - 1-2 x daily - 7 x weekly - 1 sets - 30 reps - 30 sec hold - Standing Lumbar Spine Flexion Stretch Counter  - 1-2 x daily - 7 x weekly - 1 sets - 5 reps - 15 sec hold - Standing Quarter Turn with Counter Support  - 1 x daily - 7 x weekly - 1-2 sets - 10 reps     PATIENT EDUCATION: Education details: 02/06/2023-Reviewed HEP Person educated: Patient and wife Education method: Programmer, multimedia, Facilities manager, and Handouts Education comprehension: verbalized understanding, returned demonstration, and needs further education  --------------------------------------------- Objective measures below taken at initial evaluation:  DIAGNOSTIC FINDINGS: NA  COGNITION: Overall cognitive status: Within functional limits for  tasks assessed   SENSATION: Light touch: WFL  MUSCLE TONE: LLE: Mild  POSTURE: rounded shoulders  LOWER EXTREMITY ROM:   WFL BLE active ROM   LOWER EXTREMITY MMT:  Grossly tested BLEs 4+/5  TRANSFERS: Assistive device utilized: None  Sit to stand: Complete Independence Stand to sit: Complete Independence  GAIT: Gait pattern: step through pattern, decreased arm swing- Left, and decreased step length- Right Distance walked: 50 ft Assistive device utilized: None Level of assistance: Modified independence   FUNCTIONAL TESTS:  5 times sit to stand: 11.31 sec Timed up and go (TUG): 12.25 sec 10 meter walk test: 12.16 sec  2.7 ft/sec TUG cognitive: 12.75 sec TUG manual: 12.32 sec MiniBESTest: 25/28  M-CTSIB  Condition 1: Firm Surface, EO 30 Sec, Normal Sway  Condition 2: Firm Surface, EC 30 Sec, Normal Sway  Condition 3: Foam Surface, EO 30 Sec, Normal Sway  Condition 4: Foam Surface, EC 30 Sec, Mild Sway    SLS R:  19.16 sec, L 20 sec (eyes open) R:  2.6 sec, L 7.16 sec (eyes closed)  360 turn R: 9 steps, L:  9 steps   TODAY'S TREATMENT:  DATEt: 01/17/2023    PATIENT EDUCATION: Education details: Eval results, POC Person educated: Patient Education method: Explanation Education comprehension: verbalized understanding  HOME EXERCISE PROGRAM: Not yet initiated  GOALS: Goals reviewed with patient? Yes  SHORT TERM GOALS: = LTGs   LONG TERM GOALS: Target date: 02/16/2023  Pt will be independent with HEP for improved balance, gait. Baseline:  Goal status: IN PROGRESS  2.  SLS to improve to 10 seconds EO/EC for improved balance for showering. Baseline:  Goal status: IN PROGRESS  3.  360 turns to R and L in 8 steps or less, for improved balance with turns. Baseline:  Goal status: IN PROGRESS  ASSESSMENT:  CLINICAL  IMPRESSION: Skilled PT session today continued  focused on gait and dynamic balance with turns and changes of direction; also focused on SLS on solid and compliant surfaces.  He has good return demo of quarter turns and wider BOS for turning activities, with no LOB.  Ended session with braiding activity, with improved crossover stepping compared to last session.  He continues to report he is feeling more balanced in the shower.  He continues to benefit from skilled PT towards goals for improved functional mobility and decreased fall risk.    OBJECTIVE IMPAIRMENTS: Abnormal gait and decreased balance.   ACTIVITY LIMITATIONS: standing, bathing, dressing, and locomotion level  PARTICIPATION LIMITATIONS: community activity  PERSONAL FACTORS: 3+ comorbidities: see above  are also affecting patient's functional outcome.   REHAB POTENTIAL: Good  CLINICAL DECISION MAKING: Stable/uncomplicated  EVALUATION COMPLEXITY: Low  PLAN:  PT FREQUENCY: 2x/week  PT DURATION: 4 weeks  PLANNED INTERVENTIONS: Therapeutic exercises, Therapeutic activity, Neuromuscular re-education, Balance training, Gait training, Patient/Family education, and Self Care  PLAN FOR NEXT SESSION: Continue work on turns, dual tasking.  Progress balance exercises-SLS, EO, EC, compliant surfaces.  Continue to work on initiation of gait   Zenab Gronewold W., PT 02/06/2023, 8:40 AM  [facility] at [facility] [address] [place], [address] [place], [place] [address] Phone # [phone] Fax # [phone]

## 2023-02-08 ENCOUNTER — Other Ambulatory Visit (HOSPITAL_BASED_OUTPATIENT_CLINIC_OR_DEPARTMENT_OTHER): Payer: Self-pay

## 2023-02-08 MED ORDER — INFLUENZA VAC A&B SURF ANT ADJ 0.5 ML IM SUSY
0.5000 mL | PREFILLED_SYRINGE | Freq: Once | INTRAMUSCULAR | 0 refills | Status: AC
  Filled 2023-02-08: qty 0.5, 1d supply, fill #0

## 2023-02-09 ENCOUNTER — Ambulatory Visit: Payer: Medicare Other | Admitting: Physical Therapy

## 2023-02-09 ENCOUNTER — Encounter: Payer: Self-pay | Admitting: Physical Therapy

## 2023-02-09 DIAGNOSIS — R2689 Other abnormalities of gait and mobility: Secondary | ICD-10-CM

## 2023-02-09 DIAGNOSIS — R29818 Other symptoms and signs involving the nervous system: Secondary | ICD-10-CM

## 2023-02-09 DIAGNOSIS — R2681 Unsteadiness on feet: Secondary | ICD-10-CM

## 2023-02-09 NOTE — Therapy (Signed)
OUTPATIENT PHYSICAL THERAPY NEURO TREATMENT   Patient Name: Todd Rice MRN: [id number] DOB:[birth year], 67 y.o., male Today's Date: 02/09/2023   PCP: Lucky Cowboy, MD REFERRING PROVIDER: Butch Penny, NP  END OF SESSION:  PT End of Session - 02/09/23 0754     Visit Number 7    Number of Visits 9    Date for PT Re-Evaluation 02/16/23    Authorization Type [employer] Medicare    PT Start Time 0800    PT Stop Time 0844    PT Time Calculation (min) 44 min    Equipment Utilized During Treatment Gait belt    Activity Tolerance Patient tolerated treatment well    Behavior During Therapy WFL for tasks assessed/performed                 Past Medical History:  Diagnosis Date   Adult BMI 50.0-59.9 kg/sq m    52.77   Anal fissure    Cancer (HCC)    kidney cancer   Diabetes mellitus without complication (HCC)    borderline   Elevated PSA 08/13/2019   H/O: gout    STABLE   Hepatic steatosis 10/01/2011   History of kidney stones    Hyperlipidemia    Hypertension    Kidney tumor    right upper kidney tumor, monitoring   OSA on CPAP    [employer] [facility]   Parkinson disease (HCC) 01/04/2016   REM sleep behavior disorder 12/06/2016   Right ureteral stone    Sleep apnea    Ureteral calculus, right 06/12/2011   Vitamin D deficiency    Weakness    Past Surgical History:  Procedure Laterality Date   ANAL FISSURE REPAIR  10/12/2000   COLONOSCOPY N/A 07/01/2012   Procedure: COLONOSCOPY;  Surgeon: Hart Carwin, MD;  Location: [facility];  Service: Endoscopy;  Laterality: N/A;   COLONOSCOPY  11/23/2022   CYSTOSCOPY WITH RETROGRADE PYELOGRAM, URETEROSCOPY AND STENT PLACEMENT Bilateral 02/26/2013   Procedure: CYSTOSCOPY WITH RETROGRADE PYELOGRAM, URETEROSCOPY AND STENT PLACEMENT;  Surgeon: Sebastian Ache, MD;  Location: [facility];  Service: Urology;  Laterality: Bilateral;   HOLMIUM LASER APPLICATION Bilateral 02/26/2013   Procedure: HOLMIUM LASER APPLICATION;  Surgeon:  Sebastian Ache, MD;  Location: [facility];  Service: Urology;  Laterality: Bilateral;   kidney stone removal     06/2011-stent placement   LEFT MEDIAL FEMORAL CONDYLE DEBRIDEMENT & DRILLING/ REMOVAL LOOSE BODY  09/15/2003   NASAL SEPTUM SURGERY  05/09/1983   ROBOT ASSISTED LAPAROSCOPIC NEPHRECTOMY Right 12/16/2021   Procedure: XI ROBOTIC ASSISTED LAPAROSCOPIC RADICAL NEPHRECTOMY;  Surgeon: Sebastian Ache, MD;  Location: [facility];  Service: Urology;  Laterality: Right;  3 HRS   STONE EXTRACTION WITH BASKET  06/12/2011   Procedure: STONE EXTRACTION WITH BASKET;  Surgeon: Garnett Farm, MD;  Location: [facility];  Service: Urology;  Laterality: Right;   Patient Active Problem List   Diagnosis Date Noted   Sialorrhea 12/28/2022   Renal mass 12/16/2021   Constipation 03/24/2021   Gout 03/23/2021   Primary renal papillary carcinoma, 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   Hepatic steatosis 02/18/2020   Major depression in remission (HCC) 11/11/2019   Central sleep apnea due to Cheyne-Stokes respiration 08/31/2019   Hyperlipidemia associated with type 2 diabetes mellitus (HCC) 07/09/2019   Aortic atherosclerosis (HCC) by CXR on 06/10/2018 06/10/2018   Type 2 diabetes mellitus with stage 3a chronic kidney disease, without long-term current use of insulin (HCC) 01/21/2018   OSA treated with BiPAP 01/03/2017  REM sleep behavior disorder 12/06/2016   CKD stage 3 due to type 2 diabetes mellitus (HCC) 10/23/2016   Medication management 10/23/2016   Vitamin D deficiency 10/23/2016   Parkinson's disease (HCC) 01/04/2016   Seasonal and perennial allergic rhinitis 06/21/2014   Asthma, mild intermittent, well-controlled 06/21/2014   Pseudomonas aeruginosa colonization 10/08/2012   CAD (coronary artery disease) 11/15/2011   Essential hypertension 11/15/2011   Morbid obesity with BMI of 40.0-44.9, adult (HCC) 11/15/2011    ONSET DATE: 01/04/2023 (MD referral and PD screen)  REFERRING DIAG: R26.89  (ICD-10-CM) - Other abnormalities of gait and mobilityG20.A1 (ICD-10-CM) - Parkinson's disease without dyskinesia or fluctuating manifestations  THERAPY DIAG:  Unsteadiness on feet  Other abnormalities of gait and mobility  Other symptoms and signs involving the nervous system  Rationale for Evaluation and Treatment: Rehabilitation  SUBJECTIVE:                                                                                                                                                                                             SUBJECTIVE STATEMENT: No changes.  "Doing okay" with the turns. Pt accompanied by: self  PERTINENT HISTORY: see above  PAIN:  Are you having pain? Yes: NPRS scale: 0/10 Pain location: low back Pain description: sore Aggravating factors: playing with dog Relieving factors: heat, time  PRECAUTIONS: Fall  RED FLAGS: None   WEIGHT BEARING RESTRICTIONS: No  FALLS: Has patient fallen in last 6 months? No  LIVING ENVIRONMENT: Lives with: lives with their spouse Lives in: House/apartment Stairs: Yes: Internal: 12 steps; on left going up Has following equipment at home: Single point cane, Walker - 2 wheeled, and walking poles; walk in shower  PLOF: Independent; walks the dog, otherwise no current exercise program (does some step up and standing balance exercises from previous therapy)  PATIENT GOALS: Pt's goals for therapy are to make sure he doesn't fall, more steady on feet  OBJECTIVE:      TODAY'S TREATMENT: 02/09/2023 Activity Comments  NuStep, Level 4>5 intervals x 1 minute, 4 extremities x 10 minutes for warm-up, aerobic activity SPM >90-100 throughout   Standing PWR! Moves- PWR! Up for posture PWR! Rock for [employer]! Twist for trunk rotation PWR! Step for step initiation -side -forward -back 10 reps     On balance beam: -heel/toe raises 2 x 10 -hamstring curls, 2 x 10 -SLS 3 x 10 sec -back step and weightshift, 2  x10 -side step and weightshift 2 x 10 -forward-back step and weightshift 2 x 10 Intermittent UE support  SLS: Alt step taps to 6" step, 10 reps Forward step ups 10  reps to 6" step 3 #        Access Code: [email or web address] URL: [email or web address] Date: 02/02/2023 Prepared by: [facility] - Outpatient  Rehab - [facility]  Program Notes Standing on your cushion:-marching in place x 10-alternating forward step taps x 10-alternating side step taps x 10-alternating back step taps x 10*Do this standing by the counter for support as needed  Exercises - Romberg Stance Eyes Closed on Foam Pad  - 1 x daily - 5 x weekly - 1 sets - 3 reps - 30 sec hold - Romberg Stance with Head Nods on Foam Pad  - 1 x daily - 5 x weekly - 1 sets - 10 reps - Romberg Stance on Foam Pad with Head Rotation  - 1 x daily - 5 x weekly - 1 sets - 10 reps - Forward and Backward Monster Walk with Resistance at Ankles and Counter Support  - 1 x daily - 5 x weekly - 1 sets - 3-5 reps - Gastroc Stretch on Wall  - 1-2 x daily - 7 x weekly - 1 sets - 3 reps - 30 sec hold - Standing Bilateral Gastroc Stretch with Step  - 1-2 x daily - 7 x weekly - 1 sets - 30 reps - 30 sec hold - Standing Lumbar Spine Flexion Stretch Counter  - 1-2 x daily - 7 x weekly - 1 sets - 5 reps - 15 sec hold - Standing Quarter Turn with Counter Support  - 1 x daily - 7 x weekly - 1-2 sets - 10 reps     PATIENT EDUCATION: Education details: 02/09/2023-Continue current HEP; PWR! Moves and relation to functional mobility Person educated: Patient and wife Education method: Explanation, Demonstration, and Handouts Education comprehension: verbalized understanding, returned demonstration, and needs further education  --------------------------------------------- Objective measures below taken at initial evaluation:  DIAGNOSTIC FINDINGS: NA  COGNITION: Overall cognitive status: Within functional limits for tasks  assessed   SENSATION: Light touch: WFL  MUSCLE TONE: LLE: Mild  POSTURE: rounded shoulders  LOWER EXTREMITY ROM:   WFL BLE active ROM   LOWER EXTREMITY MMT:  Grossly tested BLEs 4+/5  TRANSFERS: Assistive device utilized: None  Sit to stand: Complete Independence Stand to sit: Complete Independence  GAIT: Gait pattern: step through pattern, decreased arm swing- Left, and decreased step length- Right Distance walked: 50 ft Assistive device utilized: None Level of assistance: Modified independence   FUNCTIONAL TESTS:  5 times sit to stand: 11.31 sec Timed up and go (TUG): 12.25 sec 10 meter walk test: 12.16 sec  2.7 ft/sec TUG cognitive: 12.75 sec TUG manual: 12.32 sec MiniBESTest: 25/28  M-CTSIB  Condition 1: Firm Surface, EO 30 Sec, Normal Sway  Condition 2: Firm Surface, EC 30 Sec, Normal Sway  Condition 3: Foam Surface, EO 30 Sec, Normal Sway  Condition 4: Foam Surface, EC 30 Sec, Mild Sway    SLS R:  19.16 sec, L 20 sec (eyes open) R:  2.6 sec, L 7.16 sec (eyes closed)  360 turn R: 9 steps, L:  9 steps   TODAY'S TREATMENT:  DATEt: 01/17/2023    PATIENT EDUCATION: Education details: Eval results, POC Person educated: Patient Education method: Explanation Education comprehension: verbalized understanding  HOME EXERCISE PROGRAM: Not yet initiated  GOALS: Goals reviewed with patient? Yes  SHORT TERM GOALS: = LTGs   LONG TERM GOALS: Target date: 02/16/2023  Pt will be independent with HEP for improved balance, gait. Baseline:  Goal status: IN PROGRESS  2.  SLS to improve to 10 seconds EO/EC for improved balance for showering. Baseline:  Goal status: IN PROGRESS  3.  360 turns to R and L in 8 steps or less, for improved balance with turns. Baseline:  Goal status: IN PROGRESS  ASSESSMENT:  CLINICAL IMPRESSION: Skilled  PT session today continued focused on dynamic balance and SLS on solid and compliant surfaces.  Utilized PWR! Moves on solid surfaces, to focus on large movements to initiate gait.  Some mild difficulty with posterior step and weightshift, but with repetition, he improves balance.  He continues to benefit from skilled PT towards goals for improved functional mobility and decreased fall risk.    OBJECTIVE IMPAIRMENTS: Abnormal gait and decreased balance.   ACTIVITY LIMITATIONS: standing, bathing, dressing, and locomotion level  PARTICIPATION LIMITATIONS: community activity  PERSONAL FACTORS: 3+ comorbidities: see above  are also affecting patient's functional outcome.   REHAB POTENTIAL: Good  CLINICAL DECISION MAKING: Stable/uncomplicated  EVALUATION COMPLEXITY: Low  PLAN:  PT FREQUENCY: 2x/week  PT DURATION: 4 weeks  PLANNED INTERVENTIONS: Therapeutic exercises, Therapeutic activity, Neuromuscular re-education, Balance training, Gait training, Patient/Family education, and Self Care  PLAN FOR NEXT SESSION: Work towards LTGs and plan for d/c next week.  Progress balance exercises-SLS, EO, EC, compliant surfaces.  Continue to work on initiation of gait   Antara Brecheisen W., PT 02/09/2023, 8:46 AM  [facility] at [facility] [address] [place], [address] [place], [place] [address] Phone # [phone] Fax # [phone]

## 2023-02-13 ENCOUNTER — Ambulatory Visit: Payer: Medicare Other | Admitting: Physical Therapy

## 2023-02-13 ENCOUNTER — Encounter: Payer: Self-pay | Admitting: Physical Therapy

## 2023-02-13 DIAGNOSIS — R2681 Unsteadiness on feet: Secondary | ICD-10-CM | POA: Diagnosis not present

## 2023-02-13 DIAGNOSIS — R29818 Other symptoms and signs involving the nervous system: Secondary | ICD-10-CM

## 2023-02-13 DIAGNOSIS — R2689 Other abnormalities of gait and mobility: Secondary | ICD-10-CM

## 2023-02-13 NOTE — Therapy (Signed)
OUTPATIENT PHYSICAL THERAPY NEURO TREATMENT   Patient Name: Todd Rice MRN: [id number] DOB:10-Apr-1956, 67 y.o., male Today's Date: 02/13/2023   PCP: Lucky Cowboy, MD REFERRING PROVIDER: Butch Penny, NP  END OF SESSION:  PT End of Session - 02/13/23 0754     Visit Number 8    Number of Visits 9    Date for PT Re-Evaluation 02/16/23    Authorization Type UHC Medicare    PT Start Time 0801    PT Stop Time 0841    PT Time Calculation (min) 40 min    Activity Tolerance Patient tolerated treatment well    Behavior During Therapy [facility] for tasks assessed/performed                  Past Medical History:  Diagnosis Date   Adult BMI 50.0-59.9 kg/sq m    52.77   Anal fissure    Cancer (HCC)    kidney cancer   Diabetes mellitus without complication (HCC)    borderline   Elevated PSA 08/13/2019   H/O: gout    STABLE   Hepatic steatosis 10/01/2011   History of kidney stones    Hyperlipidemia    Hypertension    Kidney tumor    right upper kidney tumor, monitoring   OSA on CPAP    [employer] [facility]   Parkinson disease (HCC) 01/04/2016   REM sleep behavior disorder 12/06/2016   Right ureteral stone    Sleep apnea    Ureteral calculus, right 06/12/2011   Vitamin D deficiency    Weakness    Past Surgical History:  Procedure Laterality Date   ANAL FISSURE REPAIR  10/12/2000   COLONOSCOPY N/A 07/01/2012   Procedure: COLONOSCOPY;  Surgeon: Hart Carwin, MD;  Location: [facility];  Service: Endoscopy;  Laterality: N/A;   COLONOSCOPY  11/23/2022   CYSTOSCOPY WITH RETROGRADE PYELOGRAM, URETEROSCOPY AND STENT PLACEMENT Bilateral 02/26/2013   Procedure: CYSTOSCOPY WITH RETROGRADE PYELOGRAM, URETEROSCOPY AND STENT PLACEMENT;  Surgeon: Sebastian Ache, MD;  Location: [facility];  Service: Urology;  Laterality: Bilateral;   HOLMIUM LASER APPLICATION Bilateral 02/26/2013   Procedure: HOLMIUM LASER APPLICATION;  Surgeon: Sebastian Ache, MD;  Location: [facility];  Service:  Urology;  Laterality: Bilateral;   kidney stone removal     06/2011-stent placement   LEFT MEDIAL FEMORAL CONDYLE DEBRIDEMENT & DRILLING/ REMOVAL LOOSE BODY  09/15/2003   NASAL SEPTUM SURGERY  05/09/1983   ROBOT ASSISTED LAPAROSCOPIC NEPHRECTOMY Right 12/16/2021   Procedure: XI ROBOTIC ASSISTED LAPAROSCOPIC RADICAL NEPHRECTOMY;  Surgeon: Sebastian Ache, MD;  Location: [facility];  Service: Urology;  Laterality: Right;  3 HRS   STONE EXTRACTION WITH BASKET  06/12/2011   Procedure: STONE EXTRACTION WITH BASKET;  Surgeon: Garnett Farm, MD;  Location: [facility];  Service: Urology;  Laterality: Right;   Patient Active Problem List   Diagnosis Date Noted   Sialorrhea 12/28/2022   Renal mass 12/16/2021   Constipation 03/24/2021   Gout 03/23/2021   Primary renal papillary carcinoma, 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   Hepatic steatosis 02/18/2020   Major depression in remission (HCC) 11/11/2019   Central sleep apnea due to Cheyne-Stokes respiration 08/31/2019   Hyperlipidemia associated with type 2 diabetes mellitus (HCC) 07/09/2019   Aortic atherosclerosis (HCC) by CXR on 06/10/2018 06/10/2018   Type 2 diabetes mellitus with stage 3a chronic kidney disease, without long-term current use of insulin (HCC) 01/21/2018   OSA treated with BiPAP 01/03/2017   REM sleep behavior disorder 12/06/2016   CKD  stage 3 due to type 2 diabetes mellitus (HCC) 10/23/2016   Medication management 10/23/2016   Vitamin D deficiency 10/23/2016   Parkinson's disease (HCC) 01/04/2016   Seasonal and perennial allergic rhinitis 06/21/2014   Asthma, mild intermittent, well-controlled 06/21/2014   Pseudomonas aeruginosa colonization 10/08/2012   CAD (coronary artery disease) 11/15/2011   Essential hypertension 11/15/2011   Morbid obesity with BMI of 40.0-44.9, adult (HCC) 11/15/2011    ONSET DATE: 01/04/2023 (MD referral and PD screen)  REFERRING DIAG: R26.89 (ICD-10-CM) - Other abnormalities of gait and  mobilityG20.A1 (ICD-10-CM) - Parkinson's disease without dyskinesia or fluctuating manifestations  THERAPY DIAG:  Unsteadiness on feet  Other abnormalities of gait and mobility  Other symptoms and signs involving the nervous system  Rationale for Evaluation and Treatment: Rehabilitation  SUBJECTIVE:                                                                                                                                                                                             SUBJECTIVE STATEMENT: Nothing new.   Pt accompanied by: self  PERTINENT HISTORY: see above  PAIN:  Are you having pain? Yes: NPRS scale: 0/10 Pain location: low back Pain description: sore Aggravating factors: playing with dog Relieving factors: heat, time  PRECAUTIONS: Fall  RED FLAGS: None   WEIGHT BEARING RESTRICTIONS: No  FALLS: Has patient fallen in last 6 months? No  LIVING ENVIRONMENT: Lives with: lives with their spouse Lives in: House/apartment Stairs: Yes: Internal: 12 steps; on left going up Has following equipment at home: Single point cane, Walker - 2 wheeled, and walking poles; walk in shower  PLOF: Independent; walks the dog, otherwise no current exercise program (does some step up and standing balance exercises from previous therapy)  PATIENT GOALS: Pt's goals for therapy are to make sure he doesn't fall, more steady on feet  OBJECTIVE:    TODAY'S TREATMENT: 02/13/2023 Activity Comments  NuStep, Level 5/Level 8 intervals x 1 minute, 4 extremities x 10 minutes for warm-up, aerobic activity SPM>100 throughout; 3 minute warm up, then heavy/light intervals   Review of HEP-see below   SLS, 3 x 10 sec Foot propped on step  SLS, 3 x 5-10 sec EO and then EC 3 x 10 sec Intermittent UE support  Gait with turns, 20 ft x 8 reps Added dual task of tossing 2.2 # weighted ball  Braiding at counter, then progressing 20 ft x 4 repsin gym area without UE support   Forward/back  walking 2 reps, 20 ft each Tossing 2.2# weighted ball          Access Code: [email or web address] URL:  [email or web address] Date: 02/02/2023 Prepared by: [facility] - Outpatient  Rehab - [facility]  Program Notes Standing on your cushion:-marching in place x 10-alternating forward step taps x 10-alternating side step taps x 10-alternating back step taps x 10*Do this standing by the counter for support as needed  Exercises - Romberg Stance Eyes Closed on Foam Pad  - 1 x daily - 5 x weekly - 1 sets - 3 reps - 30 sec hold - Romberg Stance with Head Nods on Foam Pad  - 1 x daily - 5 x weekly - 1 sets - 10 reps - Romberg Stance on Foam Pad with Head Rotation  - 1 x daily - 5 x weekly - 1 sets - 10 reps - Forward and Backward Monster Walk with Resistance at Ankles and Counter Support  - 1 x daily - 5 x weekly - 1 sets - 3-5 reps - Gastroc Stretch on Wall  - 1-2 x daily - 7 x weekly - 1 sets - 3 reps - 30 sec hold - Standing Bilateral Gastroc Stretch with Step  - 1-2 x daily - 7 x weekly - 1 sets - 30 reps - 30 sec hold - Standing Lumbar Spine Flexion Stretch Counter  - 1-2 x daily - 7 x weekly - 1 sets - 5 reps - 15 sec hold - Standing Quarter Turn with Counter Support  - 1 x daily - 7 x weekly - 1-2 sets - 10 reps     PATIENT EDUCATION: Education details: 02/13/2023-Continue current HEP (balance ex 3-4x/wk and stretches daily); reinforced plans for re-joining gym Optician, dispensing) Person educated: Patient and wife Education method: Explanation, Demonstration, and Handouts Education comprehension: verbalized understanding, returned demonstration, and needs further education  --------------------------------------------- Objective measures below taken at initial evaluation:  DIAGNOSTIC FINDINGS: NA  COGNITION: Overall cognitive status: Within functional limits for tasks assessed   SENSATION: Light touch: WFL  MUSCLE TONE: LLE: Mild  POSTURE: rounded shoulders  LOWER  EXTREMITY ROM:   WFL BLE active ROM   LOWER EXTREMITY MMT:  Grossly tested BLEs 4+/5  TRANSFERS: Assistive device utilized: None  Sit to stand: Complete Independence Stand to sit: Complete Independence  GAIT: Gait pattern: step through pattern, decreased arm swing- Left, and decreased step length- Right Distance walked: 50 ft Assistive device utilized: None Level of assistance: Modified independence   FUNCTIONAL TESTS:  5 times sit to stand: 11.31 sec Timed up and go (TUG): 12.25 sec 10 meter walk test: 12.16 sec  2.7 ft/sec TUG cognitive: 12.75 sec TUG manual: 12.32 sec MiniBESTest: 25/28  M-CTSIB  Condition 1: Firm Surface, EO 30 Sec, Normal Sway  Condition 2: Firm Surface, EC 30 Sec, Normal Sway  Condition 3: Foam Surface, EO 30 Sec, Normal Sway  Condition 4: Foam Surface, EC 30 Sec, Mild Sway    SLS R:  19.16 sec, L 20 sec (eyes open) R:  2.6 sec, L 7.16 sec (eyes closed)  360 turn R: 9 steps, L:  9 steps   TODAY'S TREATMENT:  DATEt: 01/17/2023    PATIENT EDUCATION: Education details: Eval results, POC Person educated: Patient Education method: Explanation Education comprehension: verbalized understanding  HOME EXERCISE PROGRAM: Not yet initiated  GOALS: Goals reviewed with patient? Yes  SHORT TERM GOALS: = LTGs   LONG TERM GOALS: Target date: 02/16/2023  Pt will be independent with HEP for improved balance, gait. Baseline:  Goal status: MET, 02/13/2023  2.  SLS to improve to 10 seconds EO/EC for improved balance for showering. Baseline:  Goal status: IN PROGRESS  3.  360 turns to R and L in 8 steps or less, for improved balance with turns. Baseline:  Goal status: IN PROGRESS  ASSESSMENT:  CLINICAL IMPRESSION: Assessed full HEP today, with pt return demo understanding.  Worked on SLS activities, with pt needing  intermittent UE support for steady balance for full 10 seconds.  Heavy education on importance of continuing HEP and transitioning to gym for additional strengthening and aerobic exercise upon d/c from PT. Pt is on track towards LTGs with anticipated d/c next visit.    OBJECTIVE IMPAIRMENTS: Abnormal gait and decreased balance.   ACTIVITY LIMITATIONS: standing, bathing, dressing, and locomotion level  PARTICIPATION LIMITATIONS: community activity  PERSONAL FACTORS: 3+ comorbidities: see above  are also affecting patient's functional outcome.   REHAB POTENTIAL: Good  CLINICAL DECISION MAKING: Stable/uncomplicated  EVALUATION COMPLEXITY: Low  PLAN:  PT FREQUENCY: 2x/week  PT DURATION: 4 weeks  PLANNED INTERVENTIONS: Therapeutic exercises, Therapeutic activity, Neuromuscular re-education, Balance training, Gait training, Patient/Family education, and Self Care  PLAN FOR NEXT SESSION: Discharge next visit and plan for return screen.   Gean Maidens., PT 02/13/2023, 8:42 AM  [facility] at [facility] [address] [place], [address] [place], [place] [address] Phone # [phone] Fax # [phone]

## 2023-02-15 ENCOUNTER — Encounter: Payer: Self-pay | Admitting: Physical Therapy

## 2023-02-15 ENCOUNTER — Ambulatory Visit: Payer: Medicare Other | Admitting: Physical Therapy

## 2023-02-15 DIAGNOSIS — R2689 Other abnormalities of gait and mobility: Secondary | ICD-10-CM | POA: Diagnosis not present

## 2023-02-15 DIAGNOSIS — R29818 Other symptoms and signs involving the nervous system: Secondary | ICD-10-CM

## 2023-02-15 DIAGNOSIS — R2681 Unsteadiness on feet: Secondary | ICD-10-CM | POA: Diagnosis not present

## 2023-02-15 NOTE — Therapy (Signed)
OUTPATIENT PHYSICAL THERAPY NEURO TREATMENT/DISCHARGE SUMMARY   Patient Name: Todd Rice MRN: [id number] DOB:1956/04/11, 67 y.o., male Today's Date: 02/15/2023   PCP: Lucky Cowboy, MD REFERRING PROVIDER: Butch Penny, NP  PHYSICAL THERAPY DISCHARGE SUMMARY  Visits from Start of Care: 9  Current functional level related to goals / functional outcomes: See below-pt has met 2 of 3 LTGs.   Remaining deficits: Very high level balance   Education / Equipment: Educated in HEP progression and importance of community fitness.   Patient agrees to discharge. Patient goals were met. Patient is being discharged due to meeting the stated rehab goals.  Plan for return screen in 6-9 months due to progressive nature of disease process.   END OF SESSION:  PT End of Session - 02/15/23 0756     Visit Number 9    Number of Visits 9    Date for PT Re-Evaluation 02/16/23    Authorization Type [employer]    PT Start Time 0800    PT Stop Time 0827    PT Time Calculation (min) 27 min    Activity Tolerance Patient tolerated treatment well    Behavior During Therapy WFL for tasks assessed/performed                   Past Medical History:  Diagnosis Date   Adult BMI 50.0-59.9 kg/sq m    52.77   Anal fissure    Cancer (HCC)    kidney cancer   Diabetes mellitus without complication (HCC)    borderline   Elevated PSA 08/13/2019   H/O: gout    STABLE   Hepatic steatosis 10/01/2011   History of kidney stones    Hyperlipidemia    Hypertension    Kidney tumor    right upper kidney tumor, monitoring   OSA on CPAP    [employer] [facility]   Parkinson disease (HCC) 01/04/2016   REM sleep behavior disorder 12/06/2016   Right ureteral stone    Sleep apnea    Ureteral calculus, right 06/12/2011   Vitamin D deficiency    Weakness    Past Surgical History:  Procedure Laterality Date   ANAL FISSURE REPAIR  10/12/2000   COLONOSCOPY N/A 07/01/2012   Procedure: COLONOSCOPY;   Surgeon: Hart Carwin, MD;  Location: [facility];  Service: Endoscopy;  Laterality: N/A;   COLONOSCOPY  11/23/2022   CYSTOSCOPY WITH RETROGRADE PYELOGRAM, URETEROSCOPY AND STENT PLACEMENT Bilateral 02/26/2013   Procedure: CYSTOSCOPY WITH RETROGRADE PYELOGRAM, URETEROSCOPY AND STENT PLACEMENT;  Surgeon: Sebastian Ache, MD;  Location: [facility];  Service: Urology;  Laterality: Bilateral;   HOLMIUM LASER APPLICATION Bilateral 02/26/2013   Procedure: HOLMIUM LASER APPLICATION;  Surgeon: Sebastian Ache, MD;  Location: [facility];  Service: Urology;  Laterality: Bilateral;   kidney stone removal     06/2011-stent placement   LEFT MEDIAL FEMORAL CONDYLE DEBRIDEMENT & DRILLING/ REMOVAL LOOSE BODY  09/15/2003   NASAL SEPTUM SURGERY  05/09/1983   ROBOT ASSISTED LAPAROSCOPIC NEPHRECTOMY Right 12/16/2021   Procedure: XI ROBOTIC ASSISTED LAPAROSCOPIC RADICAL NEPHRECTOMY;  Surgeon: Sebastian Ache, MD;  Location: [facility];  Service: Urology;  Laterality: Right;  3 HRS   STONE EXTRACTION WITH BASKET  06/12/2011   Procedure: STONE EXTRACTION WITH BASKET;  Surgeon: Garnett Farm, MD;  [facility];  Service: Urology;  Laterality: Right;   Patient Active Problem List   Diagnosis Date Noted   Sialorrhea 12/28/2022   Renal mass 12/16/2021   Constipation 03/24/2021   Gout  03/23/2021   Primary renal papillary carcinoma, 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   Hepatic steatosis 02/18/2020   Major depression in remission (HCC) 11/11/2019   Central sleep apnea due to Cheyne-Stokes respiration 08/31/2019   Hyperlipidemia associated with type 2 diabetes mellitus (HCC) 07/09/2019   Aortic atherosclerosis (HCC) by CXR on 06/10/2018 06/10/2018   Type 2 diabetes mellitus with stage 3a chronic kidney disease, without long-term current use of insulin (HCC) 01/21/2018   OSA treated with BiPAP 01/03/2017   REM sleep behavior disorder 12/06/2016   CKD stage 3 due to type 2 diabetes mellitus (HCC) 10/23/2016    Medication management 10/23/2016   Vitamin D deficiency 10/23/2016   Parkinson's disease (HCC) 01/04/2016   Seasonal and perennial allergic rhinitis 06/21/2014   Asthma, mild intermittent, well-controlled 06/21/2014   Pseudomonas aeruginosa colonization 10/08/2012   CAD (coronary artery disease) 11/15/2011   Essential hypertension 11/15/2011   Morbid obesity with BMI of 40.0-44.9, adult (HCC) 11/15/2011    ONSET DATE: 01/04/2023 (MD referral and PD screen)  REFERRING DIAG: R26.89 (ICD-10-CM) - Other abnormalities of gait and mobilityG20.A1 (ICD-10-CM) - Parkinson's disease without dyskinesia or fluctuating manifestations  THERAPY DIAG:  Unsteadiness on feet  Other abnormalities of gait and mobility  Other symptoms and signs involving the nervous system  Rationale for Evaluation and Treatment: Rehabilitation  SUBJECTIVE:                                                                                                                                                                                             SUBJECTIVE STATEMENT: Nothing new.   Pt accompanied by: self  PERTINENT HISTORY: see above  PAIN:  Are you having pain? Yes: NPRS scale: 0/10 Pain location: low back Pain description: sore Aggravating factors: playing with dog Relieving factors: heat, time  PRECAUTIONS: Fall  RED FLAGS: None   WEIGHT BEARING RESTRICTIONS: No  FALLS: Has patient fallen in last 6 months? No  LIVING ENVIRONMENT: Lives with: lives with their spouse Lives in: House/apartment Stairs: Yes: Internal: 12 steps; on left going up Has following equipment at home: Single point cane, Walker - 2 wheeled, and walking poles; walk in shower  PLOF: Independent; walks the dog, otherwise no current exercise program (does some step up and standing balance exercises from previous therapy)  PATIENT GOALS: Pt's goals for therapy are to make sure he doesn't fall, more steady on feet  OBJECTIVE:     TODAY'S TREATMENT: 02/15/23 Activity Comments  NuStep, Level 5/Level 8 intervals x 1 minute, 4 extremities x 10 minutes for warm-up, aerobic activity SPM>100 throughout; 3 minute warm up, then heavy/light intervals 1000 steps total  FTSTS:  10.56 sec   TUG 10.72 sec   Gait velocity 22M:  10.34 sec (3.17 ft/sec) 22M fast:  7.56 sec (4.34 ft/sec)   360 turn R:  7 steps, 3.68 sec 360 turn L:  7 steps, 3.72 sec   SLS RLE:  EO:  10.87; EC 5.28 sec SLS LLE: EO:  12.47, EC 10.28                Access Code: [email or web address] URL: [email or web address] Date: 02/02/2023 Prepared by: [facility] - Outpatient  Rehab - [facility]  Program Notes Standing on your cushion:-marching in place x 10-alternating forward step taps x 10-alternating side step taps x 10-alternating back step taps x 10*Do this standing by the counter for support as needed  Exercises - Romberg Stance Eyes Closed on Foam Pad  - 1 x daily - 5 x weekly - 1 sets - 3 reps - 30 sec hold - Romberg Stance with Head Nods on Foam Pad  - 1 x daily - 5 x weekly - 1 sets - 10 reps - Romberg Stance on Foam Pad with Head Rotation  - 1 x daily - 5 x weekly - 1 sets - 10 reps - Forward and Backward Monster Walk with Resistance at Ankles and Counter Support  - 1 x daily - 5 x weekly - 1 sets - 3-5 reps - Gastroc Stretch on Wall  - 1-2 x daily - 7 x weekly - 1 sets - 3 reps - 30 sec hold - Standing Bilateral Gastroc Stretch with Step  - 1-2 x daily - 7 x weekly - 1 sets - 30 reps - 30 sec hold - Standing Lumbar Spine Flexion Stretch Counter  - 1-2 x daily - 7 x weekly - 1 sets - 5 reps - 15 sec hold - Standing Quarter Turn with Counter Support  - 1 x daily - 7 x weekly - 1-2 sets - 10 reps     PATIENT EDUCATION: Education details: 02/15/2023-Progress towards goals, POC and plans for d/c this visit; plan for return screen in 6-9 months Person educated: Patient  Education method: Explanation, Demonstration, and  Handouts Education comprehension: verbalized understanding, returned demonstration, and needs further education  --------------------------------------------- Objective measures below taken at initial evaluation:  DIAGNOSTIC FINDINGS: NA  COGNITION: Overall cognitive status: Within functional limits for tasks assessed   SENSATION: Light touch: WFL  MUSCLE TONE: LLE: Mild  POSTURE: rounded shoulders  LOWER EXTREMITY ROM:   WFL BLE active ROM   LOWER EXTREMITY MMT:  Grossly tested BLEs 4+/5  TRANSFERS: Assistive device utilized: None  Sit to stand: Complete Independence Stand to sit: Complete Independence  GAIT: Gait pattern: step through pattern, decreased arm swing- Left, and decreased step length- Right Distance walked: 50 ft Assistive device utilized: None Level of assistance: Modified independence   FUNCTIONAL TESTS:  5 times sit to stand: 11.31 sec Timed up and go (TUG): 12.25 sec 10 meter walk test: 12.16 sec  2.7 ft/sec TUG cognitive: 12.75 sec TUG manual: 12.32 sec MiniBESTest: 25/28  M-CTSIB  Condition 1: Firm Surface, EO 30 Sec, Normal Sway  Condition 2: Firm Surface, EC 30 Sec, Normal Sway  Condition 3: Foam Surface, EO 30 Sec, Normal Sway  Condition 4: Foam Surface, EC 30 Sec, Mild Sway    SLS R:  19.16 sec, L 20 sec (eyes open) R:  2.6 sec, L 7.16 sec (eyes closed)  360 turn R: 9 steps, L:  9 steps   TODAY'S TREATMENT:  DATEt: 01/17/2023    PATIENT EDUCATION: Education details: Eval results, POC Person educated: Patient Education method: Explanation Education comprehension: verbalized understanding  HOME EXERCISE PROGRAM: Not yet initiated  GOALS: Goals reviewed with patient? Yes  SHORT TERM GOALS: = LTGs   LONG TERM GOALS: Target date: 02/16/2023  Pt will be independent with HEP for improved balance,  gait. Baseline:  Goal status: MET, 02/13/2023  2.  SLS to improve to 10 seconds EO/EC for improved balance for showering. Baseline: see above Goal status: PARTIALLY MET, 02/13/2023  3.  360 turns to R and L in 8 steps or less, for improved balance with turns. Baseline: 7 steps both directions Goal status: MET, 02/13/2023  ASSESSMENT:  CLINICAL IMPRESSION: Remaining LTGs assessed this visit.  Pt has met 2 of 3 LTGs.  LTG 1 and 3 met.  LTG 2 partially met, with SLS EC on RLE 5 seconds, so not quite to goal level.  Pt overall is reporting improved balance in shower with eyes closed.  With additional objective measures, pt demonstrates improvement since evaluation as well.  Encouraged pt to continue HEP and return to [facility], which pt reports he plans to do.  He is appropriate for discharge from PT today.  OBJECTIVE IMPAIRMENTS: Abnormal gait and decreased balance.   ACTIVITY LIMITATIONS: standing, bathing, dressing, and locomotion level  PARTICIPATION LIMITATIONS: community activity  PERSONAL FACTORS: 3+ comorbidities: see above  are also affecting patient's functional outcome.   REHAB POTENTIAL: Good  CLINICAL DECISION MAKING: Stable/uncomplicated  EVALUATION COMPLEXITY: Low  PLAN:  PT FREQUENCY: 2x/week  PT DURATION: 4 weeks  PLANNED INTERVENTIONS: Therapeutic exercises, Therapeutic activity, Neuromuscular re-education, Balance training, Gait training, Patient/Family education, and Self Care  PLAN FOR NEXT SESSION: Discharge this visit and plan for return screen in 6-9 months.   Gean Maidens., PT 02/15/2023, 8:34 AM  [facility] at [facility] [address] [place], [address] [place], [place] [address] Phone # [phone] Fax # [phone]

## 2023-02-28 ENCOUNTER — Other Ambulatory Visit: Payer: Self-pay

## 2023-02-28 IMAGING — MR MR ABDOMEN WO/W CM
17 of 19 series · 42 of 48 positions shown · IV contrast (multihance)
Comparison: CT abdomen, 08/02/2020, MR abdomen, 09/06/2018, CT
abdomen, 06/15/2014

CLINICAL DATA: Follow-up pancreatic cystic lesion

EXAM:
MRI ABDOMEN WITHOUT AND WITH CONTRAST
TECHNIQUE: Multiplanar multisequence MR imaging of the abdomen was performed
both before and after the administration of intravenous contrast.
CONTRAST:  20mL MULTIHANCE GADOBENATE DIMEGLUMINE 529 MG/ML IV SOLN

[Series 3: T2 · coronal · 5.0mm · 1.68mm/px · 1 of 41 slices shown (1 of 4)]
[im 1/41]
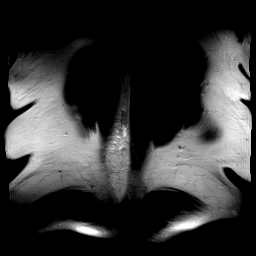

[Series 5: T2 · axial · 5.0mm · 1.48mm/px · 1 of 45 slices shown (2 of 4)]
[im 1/45]
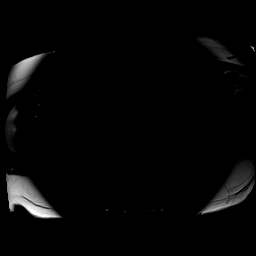

[Series 6: T1 · axial · 3.0mm · 1.25mm/px · z∈[-75,+185]mm · 6 of 176 slices shown]
[im 1/176]
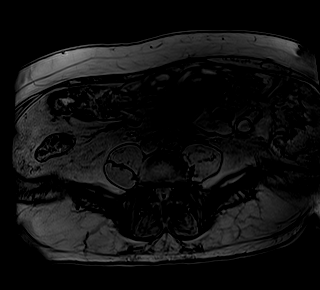
[im 36/176]
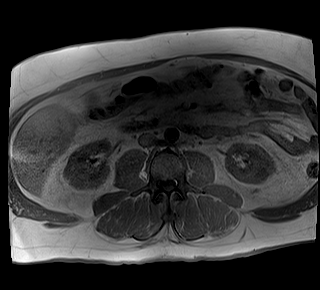
[im 71/176]
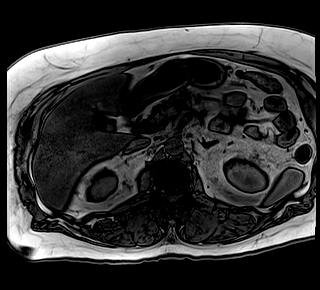
[im 106/176]
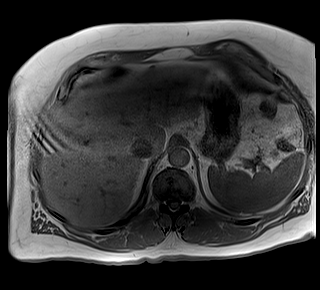
[im 141/176]
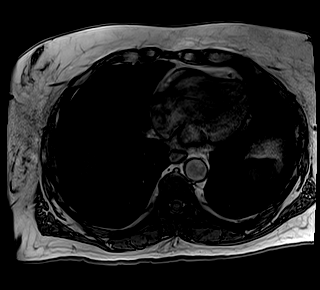
[im 176/176]
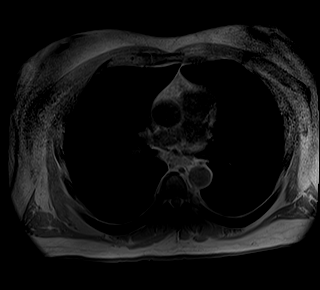

[Series 7: DWI · axial · 5.0mm · 1.42mm/px · z∈[-54,+195]mm · 4 of 129 slices shown (1 of 2)]
[im 1/129]
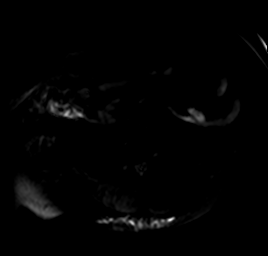
[im 43/129]
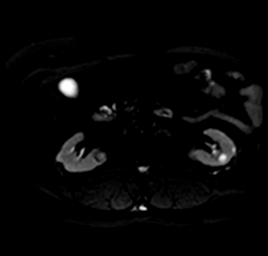
[im 86/129]
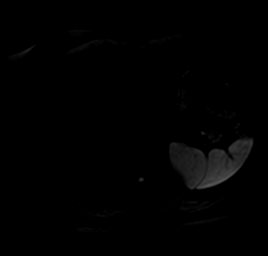
[im 129/129]
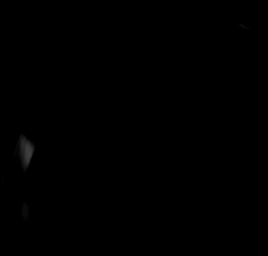

[Series 8: DWI · axial · 5.0mm · 1.42mm/px · 1 of 43 slices shown (2 of 2)]
[im 1/43]
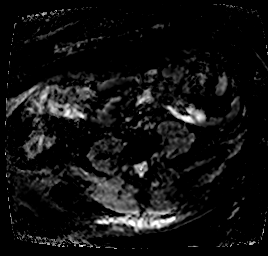

[Series 9: T2 · axial · 6.0mm · 1.22mm/px · 1 of 40 slices shown (3 of 4)]
[im 1/40]
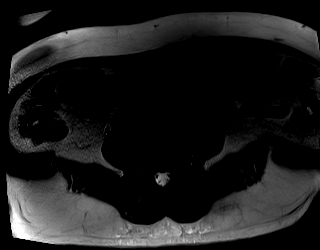

[Series 12: bSSFP · axial · 5.0mm · 1.56mm/px · 1 of 42 slices shown]
[im 1/42]
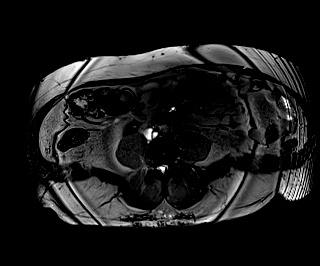

[Series 15: T2 · coronal · 3.0mm · 1.19mm/px · 1 of 19 slices shown (4 of 4)]
[im 1/19]
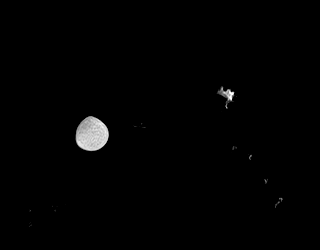

[Series 16: MRCP · coronal · 1.0mm · 0.49mm/px · 2 of 64 slices shown]
[im 1/64]
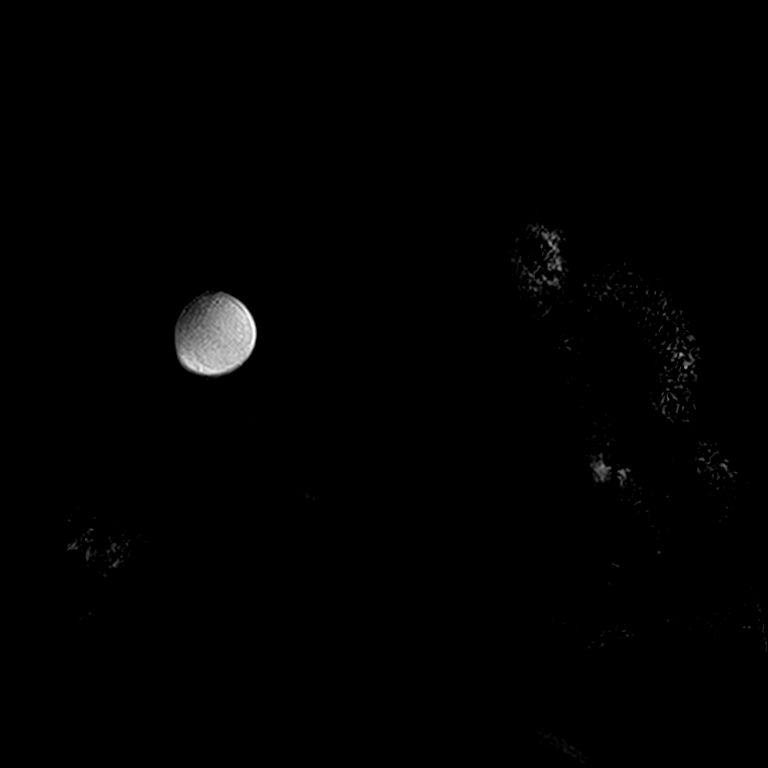
[im 64/64]
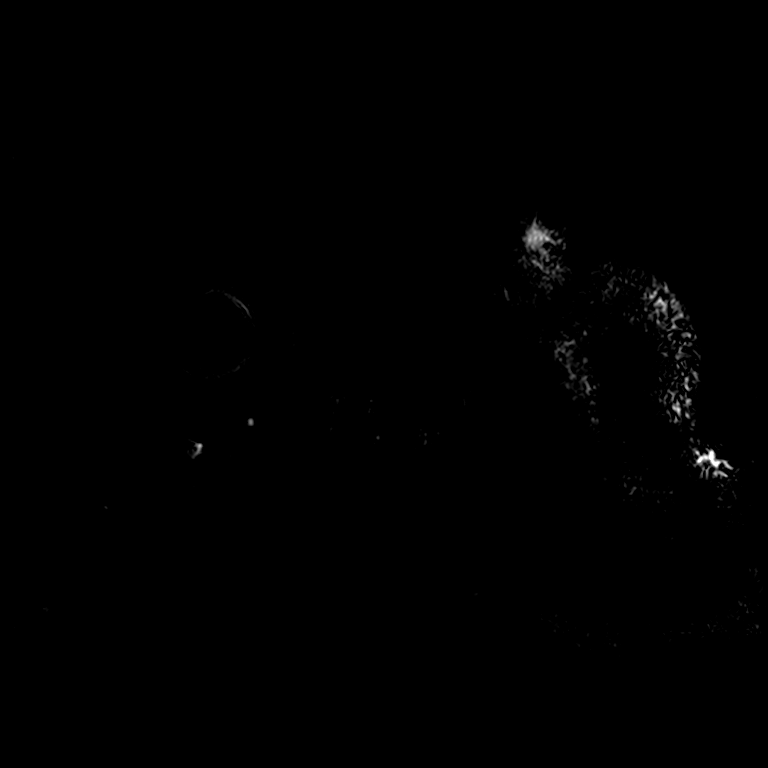

[Series 18: T1 dynamic · axial · non-contrast · 3.0mm · 1.25mm/px · z∈[-76,+184]mm · 3 of 88 slices shown]
[im 1/88]
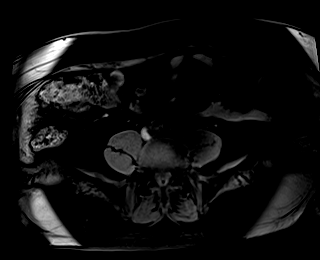
[im 44/88]
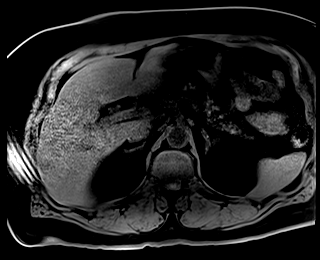
[im 88/88]
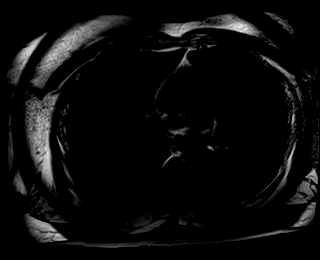

[Series 19: T1 dynamic post-contrast · axial · 3.0mm · 1.25mm/px · z∈[-76,+184]mm · 3 of 88 slices shown (1 of 7)]
[im 1/88]
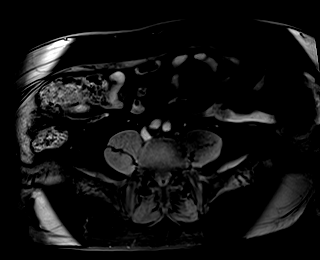
[im 44/88]
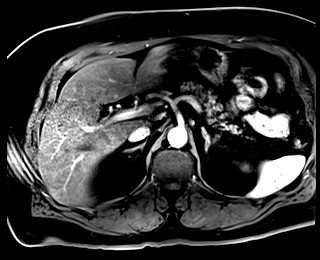
[im 88/88]
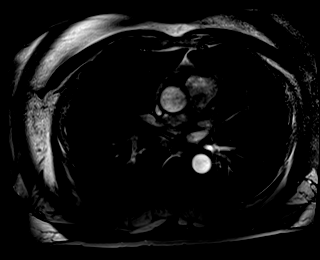

[Series 20: T1 dynamic post-contrast · axial · 3.0mm · 1.25mm/px · z∈[-76,+184]mm · 3 of 88 slices shown (2 of 7)]
[im 1/88]
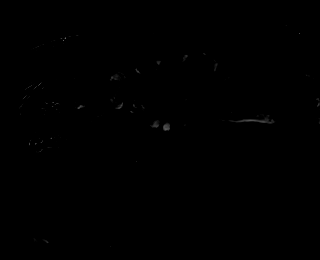
[im 44/88]
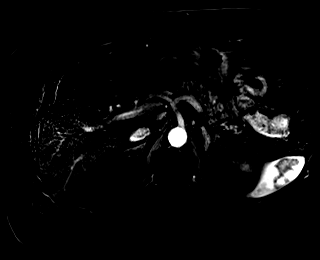
[im 88/88]
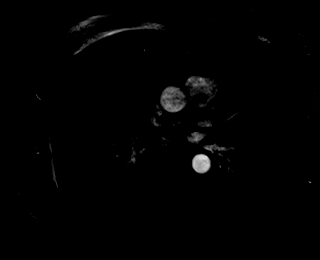

[Series 21: T1 dynamic post-contrast · axial · 3.0mm · 1.25mm/px · z∈[-76,+184]mm · 3 of 88 slices shown (3 of 7)]
[im 1/88]
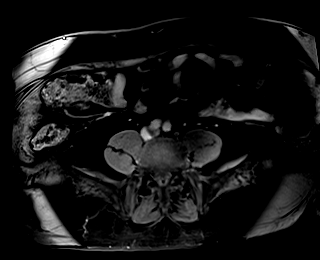
[im 44/88]
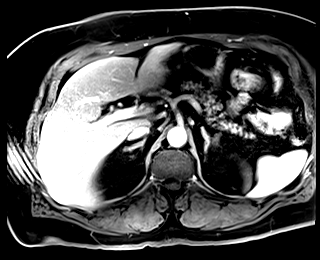
[im 88/88]
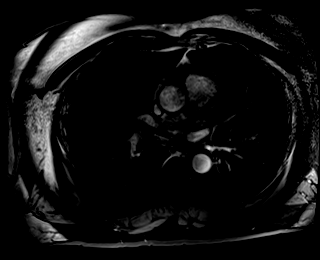

[Series 22: T1 dynamic post-contrast · axial · 3.0mm · 1.25mm/px · z∈[-76,+184]mm · 3 of 88 slices shown (4 of 7)]
[im 1/88]
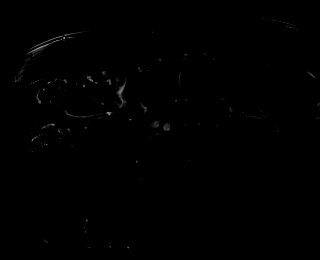
[im 44/88]
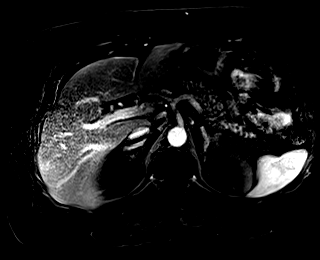
[im 88/88]
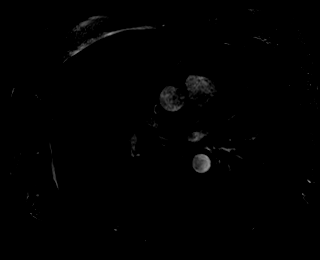

[Series 23: T1 dynamic post-contrast · axial · 3.0mm · 1.25mm/px · z∈[-76,+184]mm · 3 of 88 slices shown (5 of 7)]
[im 1/88]
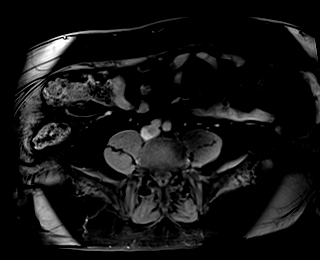
[im 44/88]
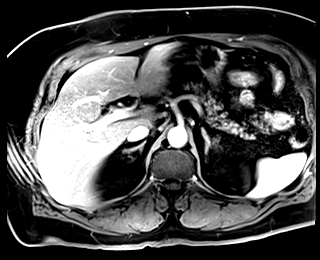
[im 88/88]
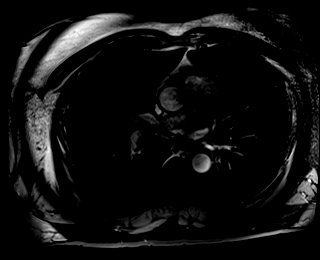

[Series 24: T1 dynamic post-contrast · axial · 3.0mm · 1.25mm/px · z∈[-76,+184]mm · 3 of 88 slices shown (6 of 7)]
[im 1/88]
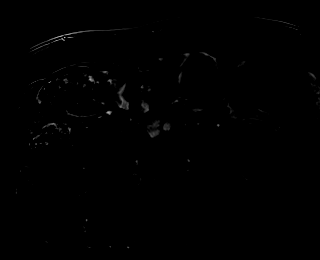
[im 44/88]
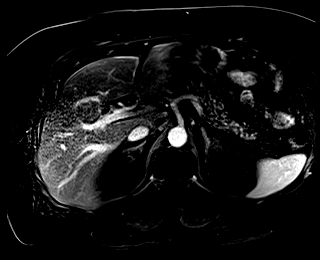
[im 88/88]
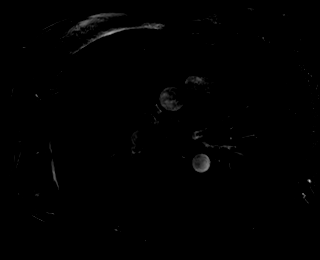

[Series 25: T1 dynamic post-contrast · coronal · 3.0mm · 1.25mm/px · 3 of 80 slices shown (7 of 7)]
[im 1/80]
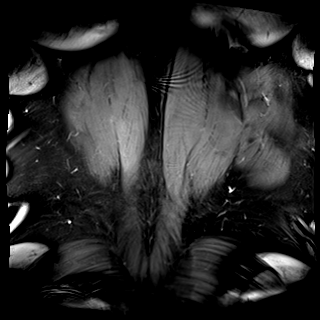
[im 40/80]
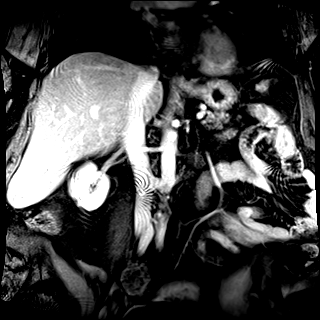
[im 80/80]
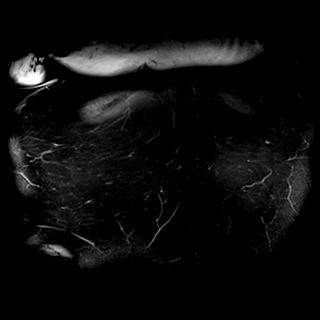

[42 of 48 positions shown; findings below may reference images not displayed]

FINDINGS: Lower chest: No acute findings.

Hepatobiliary: Hepatic steatosis. No mass or other parenchymal
abnormality identified. Gallstone in the dependent gallbladder. No
gallbladder wall thickening. No biliary ductal dilatation.

Pancreas: Unchanged 1.6 cm fluid signal cystic lesion of the
pancreatic body, with an adjacent 0.6 cm lesion (series 5, image 21)
no solid mass, inflammatory changes, or other parenchymal
abnormality identified. No pancreatic ductal dilatation.

Spleen:  Within normal limits in size and appearance.

Adrenals/Urinary Tract: Redemonstrated brightly contrast enhancing,
partially exophytic mass of the medial posterior midportion of the
right kidney, measuring 2.6 x 1.9 cm (series 19, image 60). No
evidence of hydronephrosis.

Stomach/Bowel: Visualized portions within the abdomen are
unremarkable.

Vascular/Lymphatic: No pathologically enlarged lymph nodes
identified. No abdominal aortic aneurysm demonstrated.

Other:  None.

Musculoskeletal: No suspicious bone lesions identified.
IMPRESSION: 1. Unchanged 1.6 cm fluid signal cystic lesion of the pancreatic
body, with an adjacent 0.6 cm lesion. This is likely a side branch
IPMN. There is no observed increased risk of malignancy in such
lesions smaller than 2 cm. No further follow-up or characterization
is specifically required for this lesion.
2. Redemonstrated brightly contrast enhancing, partially exophytic
mass of the medial posterior midportion of the right kidney,
measuring 2.6 cm, which remains consistent with renal cell
carcinoma.
3. Hepatic steatosis.
4. Cholelithiasis.

## 2023-03-01 ENCOUNTER — Other Ambulatory Visit (HOSPITAL_BASED_OUTPATIENT_CLINIC_OR_DEPARTMENT_OTHER): Payer: Self-pay

## 2023-03-12 ENCOUNTER — Ambulatory Visit: Payer: Medicare Other | Admitting: Neurology

## 2023-03-20 ENCOUNTER — Telehealth: Payer: Self-pay | Admitting: Neurology

## 2023-03-20 NOTE — Telephone Encounter (Signed)
Scarlette Calico @ [facility] has called re: delivery of pt's Botox, Single Dose Vial 100 units Powdered 90 day delivery date tomorrow, address and hours provided

## 2023-03-20 NOTE — Telephone Encounter (Signed)
Noted, updated appt note.

## 2023-03-20 NOTE — Telephone Encounter (Signed)
 Delivery is pending pt consent.

## 2023-03-28 ENCOUNTER — Other Ambulatory Visit (HOSPITAL_BASED_OUTPATIENT_CLINIC_OR_DEPARTMENT_OTHER): Payer: Self-pay

## 2023-03-28 ENCOUNTER — Ambulatory Visit: Payer: Medicare Other | Admitting: Neurology

## 2023-03-28 VITALS — BP 132/81 | HR 70

## 2023-03-28 DIAGNOSIS — K117 Disturbances of salivary secretion: Secondary | ICD-10-CM | POA: Diagnosis not present

## 2023-03-28 DIAGNOSIS — G20B1 Parkinson's disease with dyskinesia, without mention of fluctuations: Secondary | ICD-10-CM

## 2023-03-28 MED ORDER — GLYCOPYRROLATE 1 MG PO TABS
1.0000 mg | ORAL_TABLET | Freq: Two times a day (BID) | ORAL | 11 refills | Status: AC | PRN
  Filled 2023-03-28: qty 60, 30d supply, fill #0

## 2023-03-28 NOTE — Progress Notes (Signed)
Chief Complaint  Patient presents with   Procedure    Rm 14 with spouse  Pt is well, wants to discuss injections before proceeding.       ASSESSMENT AND PLAN  Todd Rice is a 67 y.o. male   Idiopathic Parkinson's disease  Doing well taking Sinemet 25/100 mg 4 times a day Sialorrhea  Have suggested to him to try suck on ice chips or sugarless candy to stimulate swallowing reflex,  Glycopyrrolate 1mg  bid as needed works well for him,  He did get Botox approved by his insurance company, he does not want to proceed with Botox injection at this point,    Obstructive sleep apnea,  Tolerating CPAP We will continue to follow with Dr. Marjory Lies and nurse practitioner for his Parkinson's disease,  DIAGNOSTIC DATA (LABS, IMAGING, TESTING) - I reviewed patient records, labs, notes, testing and imaging myself where available.   MEDICAL HISTORY:  Todd Rice, [place] a 67 year old male, seen in request by Dr. Marjory Lies, Satira Sark R, for evaluation of botulism toxin injection for sialorrhea  I reviewed and summarized the referring note.PMHX Obesity Parkinson's disease OSA DM HLD HTN  He was diagnosed of Parkinson's disease since 2017, responding well to Sinemet , 6 months ago, Sinemet dose was increased to 25/100 mg 4 times a day at 7, 11, 3 and 7 PM, higher dose of Sinemet helped him move better, no longer have noticeable wearing off, he remain active, mowing the yard, ride bicycle few times each week, no significant gait abnormality  Since June 2024, he began to notice excessive drooling, intermittent, sometimes worse than the others, he has to wipe his mouth corner often, sometimes wake up in the middle of the night, saliva right his pillowcase  He has not tried other treatment for his excessive sialorrhea yet,  He was given the prescription of glycopyrrolate 1 mg twice daily as needed, since last visit in August 2024, works well for his sialorrhea, only taking it as needed, he  was approved for Botox injection, but decided not to proceed,  PHYSICAL EXAM:   Vitals:   03/28/23 1444  BP: 132/81  Pulse: 70     There is no height or weight on file to calculate BMI.  PHYSICAL EXAMNIATION:  Gen: NAD, conversant, well nourised, well groomed             NEUROLOGICAL EXAM:  MENTAL STATUS: Speech/cognition: Obese, mild masked face, awake, alert, oriented to history taking and casual conversation CRANIAL NERVES: CN II: Visual fields are full to confrontation. Pupils are round equal and briskly reactive to light. CN III, IV, VI: extraocular movement are normal. No ptosis. CN V: Facial sensation is intact to light touch CN VII: Face is symmetric with normal eye closure  CN VIII: Hearing is normal to causal conversation. CN IX, X: Phonation is normal. CN XI: Head turning and shoulder shrug are intact CN XII: Narrow oropharyngeal space, short neck  MOTOR: Normal strength, no significant resting tremor, mild left more than right rigidity, bradykinesia  REFLEXES: Reflexes are 1 and symmetric at the biceps, triceps, knees, and ankles. Plantar responses are flexor.  SENSORY: Intact to light touch, pinprick and vibratory sensation are intact in fingers and toes.  COORDINATION: There is no trunk or limb dysmetria noted.  GAIT/STANCE: Able to get up from seated position arm crossed, steady, mildly decreased left arm swing, moderate stride, smooth turning  REVIEW OF SYSTEMS:  Full 14 system review of systems performed and notable only for  as above All other review of systems were negative.   ALLERGIES: Allergies  Allergen Reactions   Ciprofloxacin Hives   Ceftriaxone Hives    HOME MEDICATIONS: Current Outpatient Medications  Medication Sig Dispense Refill   albuterol (VENTOLIN HFA) 108 (90 Base) MCG/ACT inhaler INHALE 2 PUFFS EVERY 4 HOURS (15 MINUTES APART) FOR ASTHMA RESCUE 6.7 g 1   allopurinol (ZYLOPRIM) 300 MG tablet Take 1 tablet (300 mg total) by  mouth daily to prevent gout. 90 tablet 3   ALPRAZolam (XANAX) 0.5 MG tablet Take 1 tab up to three times a day only if needed for severe anxiety or panic attack. Avoid daily use. 80 tablet 0   calcium carbonate (TUMS - DOSED IN MG ELEMENTAL CALCIUM) 500 MG chewable tablet Chew 1 tablet by mouth daily as needed for indigestion or heartburn.     carbidopa-levodopa (SINEMET IR) 25-250 MG tablet Take 1 tablet by mouth 4 (four) times daily. 360 tablet 4   Cholecalciferol (VITAMIN D) 50 MCG (2000 UT) tablet Take 2,000 Units by mouth 2 (two) times daily.     ezetimibe (ZETIA) 10 MG tablet Take  1 tablet  Daily  for Cholesterol 90 tablet 3   glipiZIDE (GLUCOTROL) 5 MG tablet Take 1/2 tab as needed for glucose above 150. (Patient taking differently: Take 2.5-5 mg by mouth See admin instructions. Take 2.5 mg as needed for glucose above 150-199, take 5 mg as needed for glucose 200 or higher) 60 tablet 2   glycopyrrolate (ROBINUL) 1 MG tablet Take 1 tablet (1 mg total) by mouth 2 (two) times daily as needed. 60 tablet 5   Magnesium 250 MG TABS Take 1 tablet (250 mg total) by mouth 2 (two) times daily with a meal. (Patient taking differently: Take 1 tablet by mouth daily.)     Menthol, Topical Analgesic, (BIOFREEZE EX) Apply 1 Application topically daily as needed (pain).     metFORMIN (GLUCOPHAGE-XR) 500 MG 24 hr tablet Take 2 tablets (1,000 mg total) by mouth 2 (two) times daily. 360 tablet 3   polyethylene glycol powder (GLYCOLAX/MIRALAX) 17 GM/SCOOP powder Take 17 g by mouth every other day. Alternating days with senna     pramipexole (MIRAPEX) 1 MG tablet Take 1 tablet (1 mg total) by mouth 3 (three) times daily. 270 tablet 3   rosuvastatin (CRESTOR) 20 MG tablet Take 1 tablet (20 mg total) by mouth daily. 90 tablet 3   Semaglutide, 1 MG/DOSE, (OZEMPIC, 1 MG/DOSE,) 4 MG/3ML SOPN Inject 1 mg into the skin once a week. 3 mL 0   senna (SENOKOT) 8.6 MG tablet Take 2 tablets by mouth every other day. Alternating  days with miralax     sodium chloride (OCEAN) 0.65 % SOLN nasal spray Place 1 spray into both nostrils as needed for congestion.     valsartan-hydrochlorothiazide (DIOVAN-HCT) 320-25 MG tablet Take 1 tablet by mouth daily for blood pressure 90 tablet 3   No current facility-administered medications for this visit.   Facility-Administered Medications Ordered in Other Visits  Medication Dose Route Frequency Provider Last Rate Last Admin   polyethylene glycol powder (GLYCOLAX/MIRALAX) container 255 g  1 Container Oral Once Berneice Heinrich Delbert Phenix., MD        PAST MEDICAL HISTORY: Past Medical History:  Diagnosis Date   Adult BMI 50.0-59.9 kg/sq m    52.77   Anal fissure    Cancer (HCC)    kidney cancer   Diabetes mellitus without complication (HCC)    borderline  Elevated PSA 08/13/2019   H/O: gout    STABLE   Hepatic steatosis 10/01/2011   History of kidney stones    Hyperlipidemia    Hypertension    Kidney tumor    right upper kidney tumor, monitoring   OSA on CPAP    [employer] [facility]   Parkinson disease (HCC) 01/04/2016   REM sleep behavior disorder 12/06/2016   Right ureteral stone    Sleep apnea    Ureteral calculus, right 06/12/2011   Vitamin D deficiency    Weakness     PAST SURGICAL HISTORY: Past Surgical History:  Procedure Laterality Date   ANAL FISSURE REPAIR  10/12/2000   COLONOSCOPY N/A 07/01/2012   Procedure: COLONOSCOPY;  Surgeon: Hart Carwin, MD;  Location: [facility];  Service: Endoscopy;  Laterality: N/A;   COLONOSCOPY  11/23/2022   CYSTOSCOPY WITH RETROGRADE PYELOGRAM, URETEROSCOPY AND STENT PLACEMENT Bilateral 02/26/2013   Procedure: CYSTOSCOPY WITH RETROGRADE PYELOGRAM, URETEROSCOPY AND STENT PLACEMENT;  Surgeon: Sebastian Ache, MD;  Location: [facility];  Service: Urology;  Laterality: Bilateral;   HOLMIUM LASER APPLICATION Bilateral 02/26/2013   Procedure: HOLMIUM LASER APPLICATION;  Surgeon: Sebastian Ache, MD;  Location: [facility];  Service: Urology;   Laterality: Bilateral;   kidney stone removal     06/2011-stent placement   LEFT MEDIAL FEMORAL CONDYLE DEBRIDEMENT & DRILLING/ REMOVAL LOOSE BODY  09/15/2003   NASAL SEPTUM SURGERY  05/09/1983   ROBOT ASSISTED LAPAROSCOPIC NEPHRECTOMY Right 12/16/2021   Procedure: XI ROBOTIC ASSISTED LAPAROSCOPIC RADICAL NEPHRECTOMY;  Surgeon: Sebastian Ache, MD;  Location: [facility];  Service: Urology;  Laterality: Right;  3 HRS   STONE EXTRACTION WITH BASKET  06/12/2011   Procedure: STONE EXTRACTION WITH BASKET;  Surgeon: Garnett Farm, MD;  Location: [facility];  Service: Urology;  Laterality: Right;    FAMILY HISTORY: Family History  Problem Relation Age of Onset   Heart disease Mother        Atrial fibrillation   Hypertension Mother    Hyperlipidemia Mother    Lung cancer Father        was a smoker   Cancer Father        lung   Hyperlipidemia Brother    Melanoma Brother    Heart disease Brother        Amyloid   Heart disease Maternal Aunt    Hyperlipidemia Maternal Aunt    Hypertension Maternal Aunt    Stroke Maternal Aunt    Heart disease Paternal Grandmother    Hyperlipidemia Paternal Grandmother    Parkinson's disease Neg Hx    Sleep apnea Neg Hx    Colon cancer Neg Hx    Rectal cancer Neg Hx    Stomach cancer Neg Hx     SOCIAL HISTORY: Social History   Socioeconomic History   Marital status: Married    Spouse name: Dawn   Number of children: 0   Years of education: Not on file   Highest education level: Not on file  Occupational History    Comment: retired  Tobacco Use   Smoking status: Former   Smokeless tobacco: Never  Advertising account planner   Vaping status: Never Used  Substance and Sexual Activity   Alcohol use: No   Drug use: No    Comment: QUIT SMOKING " POT" 40 YRS AGO   Sexual activity: Yes    Partners: Female  Other Topics Concern   Not on file  Social History Narrative   Right-handed.   No caffeine  use.   Lives at home with his wife.   Social  Determinants of Health   Financial Resource Strain: Not on file  Food Insecurity: Not on file  Transportation Needs: Not on file  Physical Activity: Not on file  Stress: Not on file  Social Connections: Not on file  Intimate Partner Violence: Not on file      Levert Feinstein, M.D. Ph.D.  [facility] [address] [place], [place] [address] Ph: [phone] Fax: [phone]  CC:  Lucky Cowboy, MD [address] [place],  [place] [address]  Lucky Cowboy, MD

## 2023-03-29 NOTE — Telephone Encounter (Signed)
Patient has decided to not move forward with Botox injections. I called [employer] and discontinued rx.

## 2023-04-02 ENCOUNTER — Encounter: Payer: Medicare Other | Admitting: Nurse Practitioner

## 2023-04-12 ENCOUNTER — Other Ambulatory Visit: Payer: Self-pay

## 2023-04-24 ENCOUNTER — Ambulatory Visit (INDEPENDENT_AMBULATORY_CARE_PROVIDER_SITE_OTHER): Payer: Medicare Other | Admitting: Nurse Practitioner

## 2023-04-24 ENCOUNTER — Other Ambulatory Visit (HOSPITAL_BASED_OUTPATIENT_CLINIC_OR_DEPARTMENT_OTHER): Payer: Self-pay

## 2023-04-24 ENCOUNTER — Encounter: Payer: Self-pay | Admitting: Nurse Practitioner

## 2023-04-24 ENCOUNTER — Other Ambulatory Visit: Payer: Self-pay

## 2023-04-24 VITALS — BP 102/64 | HR 64 | Temp 97.8°F | Ht 66.0 in | Wt 246.2 lb

## 2023-04-24 DIAGNOSIS — E1169 Type 2 diabetes mellitus with other specified complication: Secondary | ICD-10-CM | POA: Diagnosis not present

## 2023-04-24 DIAGNOSIS — E559 Vitamin D deficiency, unspecified: Secondary | ICD-10-CM | POA: Diagnosis not present

## 2023-04-24 DIAGNOSIS — Z0000 Encounter for general adult medical examination without abnormal findings: Secondary | ICD-10-CM | POA: Diagnosis not present

## 2023-04-24 DIAGNOSIS — N1832 Chronic kidney disease, stage 3b: Secondary | ICD-10-CM

## 2023-04-24 DIAGNOSIS — I10 Essential (primary) hypertension: Secondary | ICD-10-CM

## 2023-04-24 DIAGNOSIS — E785 Hyperlipidemia, unspecified: Secondary | ICD-10-CM

## 2023-04-24 DIAGNOSIS — Z130 Encounter for screening for diseases of the blood and blood-forming organs and certain disorders involving the immune mechanism: Secondary | ICD-10-CM

## 2023-04-24 DIAGNOSIS — Z2239 Carrier of other specified bacterial diseases: Secondary | ICD-10-CM

## 2023-04-24 DIAGNOSIS — F325 Major depressive disorder, single episode, in full remission: Secondary | ICD-10-CM

## 2023-04-24 DIAGNOSIS — E1122 Type 2 diabetes mellitus with diabetic chronic kidney disease: Secondary | ICD-10-CM

## 2023-04-24 DIAGNOSIS — J302 Other seasonal allergic rhinitis: Secondary | ICD-10-CM

## 2023-04-24 DIAGNOSIS — Z0001 Encounter for general adult medical examination with abnormal findings: Secondary | ICD-10-CM

## 2023-04-24 DIAGNOSIS — K5900 Constipation, unspecified: Secondary | ICD-10-CM

## 2023-04-24 DIAGNOSIS — G20A1 Parkinson's disease without dyskinesia, without mention of fluctuations: Secondary | ICD-10-CM

## 2023-04-24 DIAGNOSIS — J4520 Mild intermittent asthma, uncomplicated: Secondary | ICD-10-CM

## 2023-04-24 DIAGNOSIS — I700 Atherosclerosis of aorta: Secondary | ICD-10-CM

## 2023-04-24 DIAGNOSIS — Z136 Encounter for screening for cardiovascular disorders: Secondary | ICD-10-CM | POA: Diagnosis not present

## 2023-04-24 DIAGNOSIS — G4733 Obstructive sleep apnea (adult) (pediatric): Secondary | ICD-10-CM

## 2023-04-24 DIAGNOSIS — G4752 REM sleep behavior disorder: Secondary | ICD-10-CM

## 2023-04-24 DIAGNOSIS — I2510 Atherosclerotic heart disease of native coronary artery without angina pectoris: Secondary | ICD-10-CM

## 2023-04-24 DIAGNOSIS — Z125 Encounter for screening for malignant neoplasm of prostate: Secondary | ICD-10-CM

## 2023-04-24 DIAGNOSIS — Z79899 Other long term (current) drug therapy: Secondary | ICD-10-CM

## 2023-04-24 DIAGNOSIS — R350 Frequency of micturition: Secondary | ICD-10-CM | POA: Diagnosis not present

## 2023-04-24 MED ORDER — ALPRAZOLAM 0.5 MG PO TABS
ORAL_TABLET | ORAL | 0 refills | Status: DC
  Filled 2023-04-24: qty 60, 20d supply, fill #0

## 2023-04-24 MED ORDER — ALBUTEROL SULFATE HFA 108 (90 BASE) MCG/ACT IN AERS
INHALATION_SPRAY | RESPIRATORY_TRACT | 1 refills | Status: AC
  Filled 2023-04-24: qty 6.7, 25d supply, fill #0
  Filled 2023-09-18: qty 6.7, 25d supply, fill #1

## 2023-04-24 NOTE — Progress Notes (Signed)
CPE  Assessment and Plan:  Annual CPE Due annually  Health maintenance reviewed Healthily lifestyle goals set   Atherosclerosis of aorta (HCC) - per CXR 06/2018 Control blood pressure, cholesterol, glucose, increase exercise.   Coronary artery disease involving native coronary artery of native heart without angina pectoris Control blood pressure, cholesterol, glucose, increase exercise.  Go to the ER if any chest pain, shortness of breath, nausea, dizziness, severe HA, changes vision/speech  Essential hypertension Discussed DASH (Dietary Approaches to Stop Hypertension) DASH diet is lower in sodium than a typical American diet. Cut back on foods that are high in saturated fat, cholesterol, and trans fats. Eat more whole-grain foods, fish, poultry, and nuts Remain active and exercise as tolerated daily.  Monitor BP at home-Call if greater than 130/80.  Check CMP/CBC  Controlled type 2 diabetes mellitus with diabetic dermatitis, without long-term current use of insulin [facility]) Education: Reviewed 'ABCs' of diabetes management  Discussed goals to be met and/or maintained include A1C (<7) Blood pressure (<130/80) Cholesterol (LDL <70) Continue Eye Exam yearly  Continue Dental Exam Q6 mo Discussed dietary recommendations Discussed Physical Activity recommendations Check A1C  Hyperlipidemia associated with T2DM (HCC) Discussed lifestyle modifications. Recommended diet heavy in fruits and veggies, omega 3's. Decrease consumption of animal meats, cheeses, and dairy products. Remain active and exercise as tolerated. Continue to monitor. Check lipids/TSH  CKD III associated with T2DM (HCC) Discussed how what you eat and drink can aide in kidney protection. Stay well hydrated. Avoid high salt foods. Avoid NSAIDS. Keep BP and BG well controlled.   Take medications as prescribed. Remain active and exercise as tolerated daily. Maintain weight.  Continue to monitor. Check  CMP/GFR/Microablumin  Class 3 obesity due to excess calories with serious comorbidity and body mass index (BMI) of 40.0 to 44.9 in adult [facility])  Discussed appropriate BMI Diet modification. Physical activity. Encouraged/praised to build confidence.  Parkinson disease [facility]) Continue neuro  New referral to Dr. Arbutus Leas, Dr. Anne Hahn retired PT routinely as needed with benefit; doing exercises at home;   Medication management All medications discussed and reviewed in full. All questions and concerns regarding medications addressed.    Vitamin D deficiency Continue supplement for goal of 60-100 Monitor Vitamin D levels  Seasonal and perennial allergic rhinitis Allegra OTC, increase H20, allergy hygiene explained. Avoid triggers  Asthma, mild intermittent, well-controlled Weight loss, PRN albuterol, avoid triggers  Right renal cell carcinoma (HCC) Dr. Berneice Heinrich following, active monitoring  Pseudomonas aeruginosa colonization Monitor, urology following Recently est with ID Dr. Daiva Eves for IV treatment if symptomatic   OSA treated with BiPAP Dr. Vickey Huger following- continue BiPAP, helping with daytime fatigue, weight loss still advised.   REM sleep behavior disorder Continue neuro follow up  Major depression in remission (HCC) In remission off of medication; monitor   Constipation Well managed alternating miralax and senokot Increase frequency if needed  Screening for deficiency anemia Monitor B12  Screening for prostate cancer Monitor PSA Urology following  Orders Placed This Encounter  Procedures   CBC with Differential/Platelet   COMPLETE METABOLIC PANEL WITH GFR   Magnesium   Lipid panel   TSH   Hemoglobin A1c   Insulin, random   VITAMIN D 25 Hydroxy (Vit-D Deficiency, Fractures)   Urinalysis, Routine w reflex microscopic   Microalbumin / creatinine urine ratio   Vitamin B12   PSA   EKG 12-Lead   Meds ordered this encounter  Medications   ALPRAZolam (XANAX)  0.5 MG tablet    Sig:  Take 1 tab up to three times a day only if needed for severe anxiety or panic attack. Avoid daily use.    Dispense:  60 tablet    Refill:  0    Supervising Provider:   Lucky Cowboy [[id number]]   albuterol (VENTOLIN HFA) 108 (90 Base) MCG/ACT inhaler    Sig: INHALE 2 PUFFS EVERY 4 HOURS (15 MINUTES APART) FOR ASTHMA RESCUE    Dispense:  6.7 g    Refill:  1    Supervising Provider:   Lucky Cowboy [phone]   Notify office for further evaluation and treatment, questions or concerns if any reported s/s fail to improve.   The patient was advised to call back or seek an in-person evaluation if any symptoms worsen or if the condition fails to improve as anticipated.   Further disposition pending results of labs. Discussed med's effects and SE's.    I discussed the assessment and treatment plan with the patient. The patient was provided an opportunity to ask questions and all were answered. The patient agreed with the plan and demonstrated an understanding of the instructions.  Discussed med's effects and SE's. Screening labs and tests as requested with regular follow-up as recommended.  I provided 35 minutes of face-to-face time during this encounter including counseling, chart review, and critical decision making was preformed.  Today's Plan of Care is based on a patient-centered health care approach known as shared decision making - the decisions, tests and treatments allow for patient preferences and values to be balanced with clinical evidence.     Future Appointments  Date Time Provider Department Center  07/26/2023  9:30 AM Adela Glimpse, NP [facility] None  10/09/2023 11:00 AM Butch Penny, NP [facility] None  10/18/2023 11:45 AM Barron Alvine CCC-SLP [facility]  10/18/2023 12:00 PM Gean Maidens, PT [facility]  10/29/2023  9:30 AM Lucky Cowboy, MD [facility] None  04/23/2024  9:00 AM Adela Glimpse, NP [facility] None   HPI 67 y.o. male patient  presents alongside wife for a CPE and 3 month follow up. He has CAD (coronary artery disease); Essential hypertension; Morbid obesity with BMI of 40.0-44.9, adult (HCC); Pseudomonas aeruginosa colonization; Seasonal and perennial allergic rhinitis; Asthma, mild intermittent, well-controlled; Parkinson's disease (HCC); CKD stage 3 due to type 2 diabetes mellitus (HCC); Medication management; Vitamin D deficiency; REM sleep behavior disorder; OSA treated with BiPAP; Type 2 diabetes mellitus with stage 3a chronic kidney disease, without long-term current use of insulin (HCC); Aortic atherosclerosis (HCC) by CXR on 06/10/2018; Hyperlipidemia associated with type 2 diabetes mellitus (HCC); Central sleep apnea due to Cheyne-Stokes respiration; Major depression in remission (HCC); Primary renal papillary carcinoma, right (HCC); Hepatic steatosis; Gout; Constipation; Renal mass; and Sialorrhea on their problem list.  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 Accompanied by his wife.   Overall he reports feeling well today.  He has some increase in drooling secondary to Parkinson's, discussing Botox for treatment however, not well covered by insurance ($170/weekly injection). His Sinemet has been increased to QID, continues Mirapex.  Follows with neurology Dr. Arbutus Leas for Parkinson's. Does continue to have significant L hand tremor and sometimes right.  Just finished up with PT earlier this month, now working with OT twice a week.   He has depression in remission off of medication (wellbutrin). He has xanax that he uses occasionally, typically 2-3 tabs per month. Rare use of ambien for sleep (prescribed by Dr. Vickey Huger).   He is on BiPAP for OSA/central sleep  apnea per Dr. Vickey Huger, endorses 100% compliance and restorative sleep.   Also has R renal papillary carcinoma, Follows annually with Dr. Berneice Heinrich, has been stable for several years. He has hx of chronic pseudomonas bladder colonization, now est with Dr. Daiva Eves so if  recurrent UTI sx planning on IV abx.   He is on miralax and senokot once a day for constipation and reports good results.   Has mild asthma, has albuterol uses q2-3 days PRN if needed when outdoors and ragweed season.    BMI is Body mass index is 39.74 kg/m., he has been working on , diet and exercise, strength exercises and walking several times a week.  Wt Readings from Last 3 Encounters:  04/24/23 246 lb 3.2 oz (111.7 kg)  01/10/23 249 lb 6.4 oz (113.1 kg)  12/28/22 246 lb 8 oz (111.8 kg)   Aortic atherosclerosis per CXR 2020. Normal stress test 2013 by Dr. Antoine Poche due to CAD per CT.  His blood pressure has been controlled at home, today their BP is BP: 102/64  He does workout. He denies chest pain, shortness of breath, dizziness.  He is on cholesterol medication (zetia 10 mg daily, rosuvastatin 20 mg daily, didn't tolerate 40 mg daily) and denies myalgias. His cholesterol is not at goal of 70 or less. The cholesterol last visit was:   Lab Results  Component Value Date   CHOL 100 01/10/2023   HDL 33 (L) 01/10/2023   LDLCALC 39 01/10/2023   TRIG 224 (H) 01/10/2023   CHOLHDL 3.0 01/10/2023   He has been working on diet and exercise for diabetes CKD, he is on ARB Hyperlipemia on crestor Aortic atherosclerosis he is on bASA he is on metformin 4 a day  Only takes glipizide if his sugar is 150+ in AM takes 1/2, 200+ takes whole if 200+ His sugars have been running 150s, rare up to 175 Was improved with ozempic 0.25 mg weekly (sample) - denies SE denies foot ulcerations, hyperglycemia, hypoglycemia , increased appetite, nausea, paresthesia of the feet, polydipsia, polyuria, visual disturbances, vomiting and weight loss.  Last A1C in the office was:  Lab Results  Component Value Date   HGBA1C 7.4 (H) 01/10/2023   He has CKD III associated with T2DM monitored at this office, was slightly lower last check:  Lab Results  Component Value Date   EGFR 44 (L) 01/10/2023   Patient  is on Vitamin D supplement.   Lab Results  Component Value Date   VD25OH 70 01/10/2023   Hx of PSA elevation, follows with Dr. Berneice Heinrich. Denies LUTS.  Lab Results  Component Value Date   PSA 2.85 03/28/2022      Current Medications:   Current Outpatient Medications (Endocrine & Metabolic):    glipiZIDE (GLUCOTROL) 5 MG tablet, Take 1/2 tab as needed for glucose above 150. (Patient taking differently: Take 2.5-5 mg by mouth See admin instructions. Take 2.5 mg as needed for glucose above 150-199, take 5 mg as needed for glucose 200 or higher)   metFORMIN (GLUCOPHAGE-XR) 500 MG 24 hr tablet, Take 2 tablets (1,000 mg total) by mouth 2 (two) times daily.   Semaglutide, 1 MG/DOSE, (OZEMPIC, 1 MG/DOSE,) 4 MG/3ML SOPN, Inject 1 mg into the skin once a week.   Current Outpatient Medications (Cardiovascular):    ezetimibe (ZETIA) 10 MG tablet, Take  1 tablet  Daily  for Cholesterol   rosuvastatin (CRESTOR) 20 MG tablet, Take 1 tablet (20 mg total) by mouth daily.   valsartan-hydrochlorothiazide (  DIOVAN-HCT) 320-25 MG tablet, Take 1 tablet by mouth daily for blood pressure   Current Outpatient Medications (Respiratory):    sodium chloride (OCEAN) 0.65 % SOLN nasal spray, Place 1 spray into both nostrils as needed for congestion.   albuterol (VENTOLIN HFA) 108 (90 Base) MCG/ACT inhaler, INHALE 2 PUFFS EVERY 4 HOURS (15 MINUTES APART) FOR ASTHMA RESCUE   Current Outpatient Medications (Analgesics):    allopurinol (ZYLOPRIM) 300 MG tablet, Take 1 tablet (300 mg total) by mouth daily to prevent gout.     Current Outpatient Medications (Other):    calcium carbonate (TUMS - DOSED IN MG ELEMENTAL CALCIUM) 500 MG chewable tablet, Chew 1 tablet by mouth daily as needed for indigestion or heartburn.   carbidopa-levodopa (SINEMET IR) 25-250 MG tablet, Take 1 tablet by mouth 4 (four) times daily.   Cholecalciferol (VITAMIN D) 50 MCG (2000 UT) tablet, Take 2,000 Units by mouth 2 (two) times daily.    glycopyrrolate (ROBINUL) 1 MG tablet, Take 1 tablet (1 mg total) by mouth 2 (two) times daily as needed.   Magnesium 250 MG TABS, Take 1 tablet (250 mg total) by mouth 2 (two) times daily with a meal. (Patient taking differently: Take 1 tablet by mouth daily.)   Menthol, Topical Analgesic, (BIOFREEZE EX), Apply 1 Application topically daily as needed (pain).   polyethylene glycol powder (GLYCOLAX/MIRALAX) 17 GM/SCOOP powder, Take 17 g by mouth every other day. Alternating days with senna   pramipexole (MIRAPEX) 1 MG tablet, Take 1 tablet (1 mg total) by mouth 3 (three) times daily.   senna (SENOKOT) 8.6 MG tablet, Take 2 tablets by mouth every other day. Alternating days with miralax   ALPRAZolam (XANAX) 0.5 MG tablet, Take 1 tab up to three times a day only if needed for severe anxiety or panic attack. Avoid daily use.   Facility-Administered Medications Ordered in Other Visits (Other):    polyethylene glycol powder (GLYCOLAX/MIRALAX) container 255 g No current facility-administered medications for this visit.  Health Maintenance:  Immunization History  Administered Date(s) Administered   Fluad Trivalent(High Dose 65+) 02/08/2023   Influenza Inj Mdck Quad With Preservative 02/22/2017, 01/21/2018, 02/06/2019   Influenza, High Dose Seasonal PF 02/18/2020, 02/10/2021, 02/07/2022   Influenza-Unspecified 02/03/2013, 02/05/2014   PFIZER Comirnaty(Gray Top)Covid-19 Tri-Sucrose Vaccine 08/17/2020, 02/17/2022   PFIZER(Purple Top)SARS-COV-2 Vaccination 06/14/2019, 07/09/2019, 03/06/2020   Pfizer Covid-19 Vaccine Bivalent Booster 76yrs & up 01/27/2021   Pfizer(Comirnaty)Fall Seasonal Vaccine 12 years and older 02/15/2022, 01/18/2023   Pneumococcal Conjugate-13 02/18/2020   Pneumococcal Polysaccharide-23 03/24/2021   Pneumococcal-Unspecified 11/05/2012   Respiratory Syncytial Virus Vaccine,Recomb Aduvanted(Arexvy) 04/13/2022   Tdap 03/26/2015, 09/07/2021   Health Maintenance  Topic Date Due    Zoster Vaccines- Shingrix (1 of 2) Never done   Diabetic kidney evaluation - Urine ACR  03/24/2022   FOOT EXAM  03/24/2022   COVID-19 Vaccine (8 - 2024-25 season) 03/15/2023   HEMOGLOBIN A1C  07/10/2023   Medicare Annual Wellness (AWV)  10/06/2023   OPHTHALMOLOGY EXAM  10/24/2023   Diabetic kidney evaluation - eGFR measurement  01/10/2024   DTaP/Tdap/Td (3 - Td or Tdap) 09/08/2031   Colonoscopy  11/22/2032   Pneumonia Vaccine 36+ Years old  Completed   INFLUENZA VACCINE  Completed   Hepatitis C Screening  Completed   HPV VACCINES  Aged Out   Shingrix: insurance covers, can get at pharmacy  Covid 19: 2/2, 2021, pfizer + 3 boosters  Colonoscopy: 06/2012, diverticuloses, Dr. Juanda Chance, 10 year recall has scheduled for 10/2022 Sleep study 2017  Last eye exam: 10/2022, Dr. Joseph Art, will request report, monitoring cataract, has scheduled next month Last dental exam: Dr. Leticia Penna, had 07/2022, new implants Last derm exam: remote Dr. Yetta Barre yearly  Patient Care Team: Lucky Cowboy, [name] Marzella Schlein., MD (Ophthalmology) Berneice Heinrich Delbert Phenix., MD as Consulting Physician (Urology) Hart Carwin, MD (Inactive) as Consulting Physician (Gastroenterology) Charlett Nose, [facility] (Inactive) as Pharmacist (Pharmacist)   Medical History:  Past Medical History:  Diagnosis Date   Adult BMI 50.0-59.9 kg/sq m    52.77   Anal fissure    Cancer (HCC)    kidney cancer   Diabetes mellitus without complication (HCC)    borderline   Elevated PSA 08/13/2019   H/O: gout    STABLE   Hepatic steatosis 10/01/2011   History of kidney stones    Hyperlipidemia    Hypertension    Kidney tumor    right upper kidney tumor, monitoring   OSA on CPAP    [employer] [facility]   Parkinson disease (HCC) 01/04/2016   REM sleep behavior disorder 12/06/2016   Right ureteral stone    Sleep apnea    Ureteral calculus, right 06/12/2011   Vitamin D deficiency    Weakness     Allergies Allergies  Allergen Reactions   Ciprofloxacin Hives   Ceftriaxone Hives    SURGICAL HISTORY He  has a past surgical history that includes Anal fissure repair (10/12/2000); Nasal septum surgery (05/09/1983); LEFT MEDIAL FEMORAL CONDYLE DEBRIDEMENT & DRILLING/ REMOVAL LOOSE BODY (09/15/2003); Stone extraction with basket (06/12/2011); kidney stone removal; Colonoscopy (N/A, 07/01/2012); Cystoscopy with retrograde pyelogram, ureteroscopy and stent placement (Bilateral, 02/26/2013); Holmium laser application (Bilateral, 02/26/2013); Robot assisted laparoscopic nephrectomy (Right, 12/16/2021); and Colonoscopy (11/23/2022). FAMILY HISTORY His family history includes Cancer in his father; Heart disease in his brother, maternal aunt, mother, and paternal grandmother; Hyperlipidemia in his brother, maternal aunt, mother, and paternal grandmother; Hypertension in his maternal aunt and mother; Lung cancer in his father; Melanoma in his brother; Stroke in his maternal aunt. SOCIAL HISTORY He  reports that he has quit smoking. He has never used smokeless tobacco. He reports that he does not drink alcohol and does not use drugs.  Review of Systems:  Review of Systems  Constitutional:  Negative for malaise/fatigue and weight loss.  HENT:  Negative for hearing loss and tinnitus.   Eyes:  Negative for blurred vision and double vision.  Respiratory:  Negative for cough, shortness of breath and wheezing.   Cardiovascular:  Negative for chest pain, palpitations, orthopnea, claudication and leg swelling.  Gastrointestinal:  Positive for constipation. Negative for abdominal pain, blood in stool, diarrhea, heartburn, melena, nausea and vomiting.  Genitourinary: Negative.   Musculoskeletal:  Negative for joint pain and myalgias.  Skin:  Negative for rash.  Neurological:  Positive for tremors (L hand). Negative for dizziness, tingling, sensory change, weakness and headaches.  Endo/Heme/Allergies:   Negative for polydipsia.  Psychiatric/Behavioral:  Negative for depression and memory loss. The patient has insomnia (occasionally ). The patient is not nervous/anxious (Rare).   All other systems reviewed and are negative.   Physical Exam: Estimated body mass index is 39.74 kg/m as calculated from the following:   Height as of this encounter: 5\' 6"  (1.676 m).   Weight as of this encounter: 246 lb 3.2 oz (111.7 kg). BP 102/64   Pulse 64   Temp 97.8 F (36.6 C)   Ht 5\' 6"  (1.676 m)   Wt 246 lb  3.2 oz (111.7 kg)   SpO2 99%   BMI 39.74 kg/m   General Appearance: Well nourished, well dressed morbidly obese male in no apparent distress.  Eyes: PERRLA, EOMs, conjunctiva no swelling or erythema ENT/Mouth: Ear canals clear bilaterally with no erythema, swelling, discharge.  TMs normal bilaterally with no erythema, bulging, or retractions.  Oropharynx clear and moist with no exudate, swelling, or erythema.  Dentition normal.   Neck: Supple, thyroid normal. No bruits, JVD, cervical adenopathy Respiratory: Respiratory effort normal, BS equal bilaterally without rales, rhonchi, wheezing or stridor.  Cardio: RRR without murmurs, rubs or gallops. Brisk peripheral pulses without edema.  Chest: symmetric, with normal excursions Abdomen: Soft, morbidly obese limiting exam, nontender, no guarding, rebound, hernias, masses, or organomegaly. Musculoskeletal: Full ROM all peripheral extremities,5/5 strength, and normal gait.  Skin: Warm, dry without rashes, lesions, ecchymosis. Neuro: A&Ox3, Cranial nerves intact, slow gait.  Overshoot of the right hand with finger to nose.  Tremor of the left hand with finger opposition>tremor of the right hand with finger opposition.  Sensation intact.  Psych: Flat affect, Insight and Judgment appropriate.   EKG:  NSR  Pansey Pinheiro, DNP, AGNP-C 9:48 AM [facility]

## 2023-04-24 NOTE — Patient Instructions (Signed)

## 2023-04-25 LAB — COMPLETE METABOLIC PANEL WITH GFR
AG Ratio: 1.4 (calc) (ref 1.0–2.5)
ALT: 6 U/L — ABNORMAL LOW (ref 9–46)
AST: 12 U/L (ref 10–35)
Albumin: 4.6 g/dL (ref 3.6–5.1)
Alkaline phosphatase (APISO): 55 U/L (ref 35–144)
BUN/Creatinine Ratio: 18 (calc) (ref 6–22)
BUN: 30 mg/dL — ABNORMAL HIGH (ref 7–25)
CO2: 27 mmol/L (ref 20–32)
Calcium: 10.5 mg/dL — ABNORMAL HIGH (ref 8.6–10.3)
Chloride: 98 mmol/L (ref 98–110)
Creat: 1.63 mg/dL — ABNORMAL HIGH (ref 0.70–1.35)
Globulin: 3.3 g/dL (ref 1.9–3.7)
Glucose, Bld: 133 mg/dL — ABNORMAL HIGH (ref 65–99)
Potassium: 4.5 mmol/L (ref 3.5–5.3)
Sodium: 137 mmol/L (ref 135–146)
Total Bilirubin: 0.6 mg/dL (ref 0.2–1.2)
Total Protein: 7.9 g/dL (ref 6.1–8.1)
eGFR: 46 mL/min/{1.73_m2} — ABNORMAL LOW (ref >=60)

## 2023-04-25 LAB — LIPID PANEL
Cholesterol: 103 mg/dL (ref <200)
HDL: 35 mg/dL — ABNORMAL LOW (ref >=40)
LDL Cholesterol (Calc): 45 mg/dL
Non-HDL Cholesterol (Calc): 68 mg/dL (ref <130)
Total CHOL/HDL Ratio: 2.9 (calc) (ref <5.0)
Triglycerides: 147 mg/dL (ref <150)

## 2023-04-25 LAB — CBC WITH DIFFERENTIAL/PLATELET
Absolute Lymphocytes: 2233 {cells}/uL (ref 850–3900)
Absolute Monocytes: 581 {cells}/uL (ref 200–950)
Basophils Absolute: 42 {cells}/uL (ref 0–200)
Basophils Relative: 0.5 %
Eosinophils Absolute: 133 {cells}/uL (ref 15–500)
Eosinophils Relative: 1.6 %
HCT: 45.2 % (ref 38.5–50.0)
Hemoglobin: 15 g/dL (ref 13.2–17.1)
MCH: 28.7 pg (ref 27.0–33.0)
MCHC: 33.2 g/dL (ref 32.0–36.0)
MCV: 86.4 fL (ref 80.0–100.0)
MPV: 10.5 fL (ref 7.5–12.5)
Monocytes Relative: 7 %
Neutro Abs: 5312 {cells}/uL (ref 1500–7800)
Neutrophils Relative %: 64 %
Platelets: 182 10*3/uL (ref 140–400)
RBC: 5.23 10*6/uL (ref 4.20–5.80)
RDW: 14.2 % (ref 11.0–15.0)
Total Lymphocyte: 26.9 %
WBC: 8.3 10*3/uL (ref 3.8–10.8)

## 2023-04-25 LAB — URINALYSIS, ROUTINE W REFLEX MICROSCOPIC
Bilirubin Urine: NEGATIVE
Glucose, UA: NEGATIVE
Hgb urine dipstick: NEGATIVE
Hyaline Cast: NONE SEEN /[LPF]
Ketones, ur: NEGATIVE
Nitrite: POSITIVE — AB
Protein, ur: NEGATIVE
RBC / HPF: NONE SEEN /[HPF] (ref 0–2)
Specific Gravity, Urine: 1.014 (ref 1.001–1.035)
Squamous Epithelial / HPF: NONE SEEN /[HPF] (ref <=5)
WBC, UA: >=60 /[HPF] — AB (ref 0–5)
pH: 6 (ref 5.0–8.0)

## 2023-04-25 LAB — VITAMIN D 25 HYDROXY (VIT D DEFICIENCY, FRACTURES): Vit D, 25-Hydroxy: 69 ng/mL (ref 30–100)

## 2023-04-25 LAB — INSULIN, RANDOM: Insulin: 21.4 u[IU]/mL — ABNORMAL HIGH

## 2023-04-25 LAB — HEMOGLOBIN A1C
Hgb A1c MFr Bld: 7 %{Hb} — ABNORMAL HIGH (ref <5.7)
Mean Plasma Glucose: 154 mg/dL
eAG (mmol/L): 8.5 mmol/L

## 2023-04-25 LAB — PSA: PSA: 2.8 ng/mL (ref <=4.00)

## 2023-04-25 LAB — MICROALBUMIN / CREATININE URINE RATIO
Creatinine, Urine: 81 mg/dL (ref 20–320)
Microalb Creat Ratio: 26 mg/g{creat} (ref <30)
Microalb, Ur: 2.1 mg/dL

## 2023-04-25 LAB — TSH: TSH: 3.84 m[IU]/L (ref 0.40–4.50)

## 2023-04-25 LAB — MAGNESIUM: Magnesium: 1.9 mg/dL (ref 1.5–2.5)

## 2023-04-25 LAB — MICROSCOPIC MESSAGE

## 2023-04-25 LAB — VITAMIN B12: Vitamin B-12: 484 pg/mL (ref 200–1100)

## 2023-05-08 ENCOUNTER — Other Ambulatory Visit: Payer: Self-pay | Admitting: Internal Medicine

## 2023-05-08 ENCOUNTER — Other Ambulatory Visit (HOSPITAL_BASED_OUTPATIENT_CLINIC_OR_DEPARTMENT_OTHER): Payer: Self-pay

## 2023-05-08 DIAGNOSIS — I10 Essential (primary) hypertension: Secondary | ICD-10-CM

## 2023-05-08 MED ORDER — VALSARTAN-HYDROCHLOROTHIAZIDE 320-25 MG PO TABS
1.0000 | ORAL_TABLET | Freq: Every day | ORAL | 3 refills | Status: DC
  Filled 2023-05-08: qty 30, 30d supply, fill #0
  Filled 2023-05-08: qty 90, 90d supply, fill #0
  Filled 2023-08-13: qty 90, 90d supply, fill #1
  Filled 2023-11-02: qty 90, 90d supply, fill #2
  Filled 2024-02-11: qty 90, 90d supply, fill #3

## 2023-05-25 ENCOUNTER — Other Ambulatory Visit: Payer: Self-pay | Admitting: Nurse Practitioner

## 2023-05-25 ENCOUNTER — Other Ambulatory Visit (HOSPITAL_BASED_OUTPATIENT_CLINIC_OR_DEPARTMENT_OTHER): Payer: Self-pay

## 2023-05-25 MED ORDER — EZETIMIBE 10 MG PO TABS
10.0000 mg | ORAL_TABLET | Freq: Every day | ORAL | 3 refills | Status: DC
  Filled 2023-05-25: qty 90, 90d supply, fill #0
  Filled 2023-09-12: qty 90, 90d supply, fill #1
  Filled 2023-11-29: qty 90, 90d supply, fill #2
  Filled 2024-02-11: qty 90, 90d supply, fill #3

## 2023-05-28 DIAGNOSIS — C641 Malignant neoplasm of right kidney, except renal pelvis: Secondary | ICD-10-CM | POA: Diagnosis not present

## 2023-06-18 ENCOUNTER — Ambulatory Visit (HOSPITAL_COMMUNITY)
Admission: RE | Admit: 2023-06-18 | Discharge: 2023-06-18 | Disposition: A | Discharge status: Home / Self Care | Payer: Medicare Other | Source: Ambulatory Visit | Attending: Urology | Admitting: Urology

## 2023-06-18 ENCOUNTER — Other Ambulatory Visit (HOSPITAL_COMMUNITY): Payer: Self-pay | Admitting: Urology

## 2023-06-18 DIAGNOSIS — C641 Malignant neoplasm of right kidney, except renal pelvis: Secondary | ICD-10-CM

## 2023-06-18 DIAGNOSIS — C649 Malignant neoplasm of unspecified kidney, except renal pelvis: Secondary | ICD-10-CM | POA: Diagnosis not present

## 2023-06-18 DIAGNOSIS — I700 Atherosclerosis of aorta: Secondary | ICD-10-CM | POA: Diagnosis not present

## 2023-06-25 DIAGNOSIS — K8020 Calculus of gallbladder without cholecystitis without obstruction: Secondary | ICD-10-CM | POA: Diagnosis not present

## 2023-06-25 DIAGNOSIS — C641 Malignant neoplasm of right kidney, except renal pelvis: Secondary | ICD-10-CM | POA: Diagnosis not present

## 2023-06-25 DIAGNOSIS — K760 Fatty (change of) liver, not elsewhere classified: Secondary | ICD-10-CM | POA: Diagnosis not present

## 2023-06-27 ENCOUNTER — Ambulatory Visit: Payer: Medicare Other | Admitting: Neurology

## 2023-06-28 ENCOUNTER — Telehealth: Payer: Self-pay | Admitting: Neurology

## 2023-06-28 NOTE — Telephone Encounter (Signed)
 noted

## 2023-06-28 NOTE — Telephone Encounter (Signed)
Pt states his symptoms with  Parkinson's disease are worsening, he has asked about an earlier appointment.  Pt accepted [facility] next available with wait list.

## 2023-07-11 ENCOUNTER — Telehealth: Payer: Self-pay | Admitting: Neurology

## 2023-07-12 ENCOUNTER — Other Ambulatory Visit: Payer: Self-pay

## 2023-07-12 ENCOUNTER — Other Ambulatory Visit (HOSPITAL_BASED_OUTPATIENT_CLINIC_OR_DEPARTMENT_OTHER): Payer: Self-pay

## 2023-07-12 MED ORDER — ALLOPURINOL 300 MG PO TABS
300.0000 mg | ORAL_TABLET | Freq: Every day | ORAL | 3 refills | Status: AC
  Filled 2023-07-12: qty 90, 90d supply, fill #0
  Filled 2023-12-18: qty 90, 90d supply, fill #1

## 2023-07-17 ENCOUNTER — Ambulatory Visit: Payer: Medicare Other | Admitting: Family Medicine

## 2023-07-17 NOTE — Telephone Encounter (Signed)
 Error

## 2023-07-26 ENCOUNTER — Ambulatory Visit: Payer: Medicare Other | Admitting: Nurse Practitioner

## 2023-08-06 ENCOUNTER — Other Ambulatory Visit (HOSPITAL_BASED_OUTPATIENT_CLINIC_OR_DEPARTMENT_OTHER): Payer: Self-pay

## 2023-08-06 ENCOUNTER — Ambulatory Visit (INDEPENDENT_AMBULATORY_CARE_PROVIDER_SITE_OTHER): Payer: Medicare Other | Admitting: Family Medicine

## 2023-08-06 ENCOUNTER — Encounter (HOSPITAL_BASED_OUTPATIENT_CLINIC_OR_DEPARTMENT_OTHER): Payer: Self-pay | Admitting: Family Medicine

## 2023-08-06 ENCOUNTER — Other Ambulatory Visit: Payer: Self-pay

## 2023-08-06 VITALS — BP 139/77 | HR 67 | Ht 66.0 in | Wt 246.1 lb

## 2023-08-06 DIAGNOSIS — N183 Chronic kidney disease, stage 3 unspecified: Secondary | ICD-10-CM

## 2023-08-06 DIAGNOSIS — E785 Hyperlipidemia, unspecified: Secondary | ICD-10-CM

## 2023-08-06 DIAGNOSIS — E1122 Type 2 diabetes mellitus with diabetic chronic kidney disease: Secondary | ICD-10-CM

## 2023-08-06 DIAGNOSIS — F411 Generalized anxiety disorder: Secondary | ICD-10-CM | POA: Insufficient documentation

## 2023-08-06 DIAGNOSIS — N1831 Chronic kidney disease, stage 3a: Secondary | ICD-10-CM

## 2023-08-06 DIAGNOSIS — I10 Essential (primary) hypertension: Secondary | ICD-10-CM

## 2023-08-06 DIAGNOSIS — Z7984 Long term (current) use of oral hypoglycemic drugs: Secondary | ICD-10-CM

## 2023-08-06 DIAGNOSIS — E1169 Type 2 diabetes mellitus with other specified complication: Secondary | ICD-10-CM

## 2023-08-06 LAB — POCT URINE DRUG SCREEN
Morphine: NOT DETECTED
POC BENZODIAZEPINES UR: NOT DETECTED
POC Cocaine UR: NOT DETECTED
POC Marijuana UR: NOT DETECTED
POC Opiate Ur: NOT DETECTED

## 2023-08-06 LAB — CBC WITH DIFFERENTIAL/PLATELET
Basophils Absolute: 0.1 10*3/uL (ref 0.0–0.2)
Basos: 1 %
EOS (ABSOLUTE): 0.1 10*3/uL (ref 0.0–0.4)
Eos: 1 %
Hematocrit: 42.6 % (ref 37.5–51.0)
Hemoglobin: 14 g/dL (ref 13.0–17.7)
Immature Grans (Abs): 0 10*3/uL (ref 0.0–0.1)
Immature Granulocytes: 0 %
Lymphocytes Absolute: 1.8 10*3/uL (ref 0.7–3.1)
Lymphs: 26 %
MCH: 28.6 pg (ref 26.6–33.0)
MCHC: 32.9 g/dL (ref 31.5–35.7)
MCV: 87 fL (ref 79–97)
Monocytes Absolute: 0.6 10*3/uL (ref 0.1–0.9)
Monocytes: 8 %
Neutrophils Absolute: 4.5 10*3/uL (ref 1.4–7.0)
Neutrophils: 64 %
Platelets: 164 10*3/uL (ref 150–450)
RBC: 4.89 x10E6/uL (ref 4.14–5.80)
RDW: 13.9 % (ref 11.6–15.4)
WBC: 7 10*3/uL (ref 3.4–10.8)

## 2023-08-06 LAB — COMPREHENSIVE METABOLIC PANEL WITH GFR
ALT: 7 IU/L (ref 0–44)
AST: 13 IU/L (ref 0–40)
Albumin: 4.2 g/dL (ref 3.9–4.9)
Alkaline Phosphatase: 62 IU/L (ref 44–121)
BUN/Creatinine Ratio: 18 (ref 10–24)
BUN: 31 mg/dL — ABNORMAL HIGH (ref 8–27)
Bilirubin Total: 0.3 mg/dL (ref 0.0–1.2)
CO2: 21 mmol/L (ref 20–29)
Calcium: 10.4 mg/dL — ABNORMAL HIGH (ref 8.6–10.2)
Chloride: 99 mmol/L (ref 96–106)
Creatinine, Ser: 1.75 mg/dL — ABNORMAL HIGH (ref 0.76–1.27)
Globulin, Total: 3 g/dL (ref 1.5–4.5)
Glucose: 143 mg/dL — ABNORMAL HIGH (ref 70–99)
Potassium: 4.6 mmol/L (ref 3.5–5.2)
Sodium: 139 mmol/L (ref 134–144)
Total Protein: 7.2 g/dL (ref 6.0–8.5)
eGFR: 42 mL/min/{1.73_m2} — ABNORMAL LOW (ref >59)

## 2023-08-06 LAB — HEMOGLOBIN A1C
Est. average glucose Bld gHb Est-mCnc: 157 mg/dL
Hgb A1c MFr Bld: 7.1 % — ABNORMAL HIGH (ref 4.8–5.6)

## 2023-08-06 MED ORDER — ROSUVASTATIN CALCIUM 20 MG PO TABS
20.0000 mg | ORAL_TABLET | Freq: Every day | ORAL | 3 refills | Status: DC
  Filled 2023-08-06: qty 30, 30d supply, fill #0
  Filled 2023-08-06 (×2): qty 90, 90d supply, fill #0
  Filled 2023-10-16: qty 90, 90d supply, fill #1

## 2023-08-06 NOTE — Progress Notes (Signed)
 New Patient Office Visit  Subjective:   Todd Rice 08-Mar-1956 08/06/2023  Chief Complaint  Patient presents with   New Patient (Initial Visit)    Patient is here today to get established with the practice. Pt's prior PCP unexpectedly passed away. States every 3 months he does get labwork done and wants to try to stay on the same schedule as prior PCP. Needs some med refills.    HPI: Todd Rice presents today to establish care at [facility] at [facility] [address]. Introduced to Publishing rights manager role and practice setting.  All questions answered.   Last PCP: Glade Lloyd, MD Last annual physical: 04/24/2023 Concerns: See below   HYPERTENSION: Todd Rice presents for the medical management of hypertension.  Patient's current hypertension medication regimen is: Valsartan- hydrochlorothiazide 320-25mg  Patient is  currently taking prescribed medications for HTN.  Patient is  regularly keeping a check on BP at home.  Adhering to low sodium diet: Yes Exercising Regularly: No Denies headache, dizziness, CP, SHOB, vision changes.   BP Readings from Last 3 Encounters:  08/06/23 139/77  04/24/23 102/64  03/28/23 132/81     CHRONIC KIDNEY DISEASE Stage IIIa: Todd Rice presents for the medical management of Chronic Kidney Disease. Patient also sees Urology for hx of elevated PSA and hx of right renal cell carcinoma. Patient requesting renal function labs to be sent to Dr. Sebastian Ache.  Patient is  adhering to renal diet. Patient is  on ACE1/ARB therapy.  Patient is  avoiding NSAIDS.     Lab Results  Component Value Date   NA 137 04/24/2023   K 4.5 04/24/2023   CO2 27 04/24/2023   GLUCOSE 133 (H) 04/24/2023   BUN 30 (H) 04/24/2023   CREATININE 1.63 (H) 04/24/2023   CALCIUM 10.5 (H) 04/24/2023   EGFR 46 (L) 04/24/2023   GFRNONAA 40 (L) 12/18/2021      DIABETES MELLITUS: Todd Rice presents for the  medical management of diabetes.  Current diabetes medication regimen: Ozempic 1mg , Metformin , Glipizide PRN if glucose is over 150 (Previously on Ozempic but stopped due to cost. Would be interested in starting again as he tolerated well pending cost)  Patient is  adhering to a diabetic diet.  Patient is not exercising regularly.  Patient is  checking BS regularly. Avg: 105-140 Patient is  checking their feet regularly.  Denies polydipsia, polyphagia, polyuria, open wounds or ulcers on feet.  Lab Results  Component Value Date   HGBA1C 7.0 (H) 04/24/2023    Foot Exam: 08/06/2023 Lab Results  Component Value Date   MICROALBUR 2.1 04/24/2023   MICROALBUR 0.6 03/24/2021    Wt Readings from Last 3 Encounters:  08/06/23 246 lb 1.6 oz (111.6 kg)  04/24/23 246 lb 3.2 oz (111.7 kg)  01/10/23 249 lb 6.4 oz (113.1 kg)    HYPERLIPIDEMIA: Todd Rice presents for the medical management of hyperlipidemia.  Patient's current HLD regimen is: Crestor 20mg , Zetia 10mg   Patient is  currently taking prescribed medications for HLD.  Adhering to heathy diet: Yes Exercising regularly: No Denies myalgias.  Lab Results  Component Value Date   CHOL 103 04/24/2023   HDL 35 (L) 04/24/2023   LDLCALC 45 04/24/2023   TRIG 147 04/24/2023   CHOLHDL 2.9 04/24/2023    PARKINSON'S MANAGEMENT:  Patient currently managed by Dr.Penumalli with neurology for Parkinson's disease.  He is currently taking Sinemet IR 25-250 mg 1 tablet 4 times  daily.  Reports symptoms are well-controlled currently.  He does have history of sialorrhea and currently takes glycopyrrolate 1 mg twice daily as needed for this.  He was considering Botox injections for sialorrhea, but declined at this time.  He does have a history of OSA and tolerates CPAP well.   The following portions of the patient's history were reviewed and updated as appropriate: past medical history, past surgical history, family history, social history,  allergies, medications, and problem list.   Patient Active Problem List   Diagnosis Date Noted   Sialorrhea 12/28/2022   Renal mass 12/16/2021   Constipation 03/24/2021   Gout 03/23/2021   Primary renal papillary carcinoma, 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   Hepatic steatosis 02/18/2020   Major depression in remission (HCC) 11/11/2019   Central sleep apnea due to Cheyne-Stokes respiration 08/31/2019   Hyperlipidemia associated with type 2 diabetes mellitus (HCC) 07/09/2019   Aortic atherosclerosis (HCC) by CXR on 06/10/2018 06/10/2018   Type 2 diabetes mellitus with stage 3a chronic kidney disease, without long-term current use of insulin (HCC) 01/21/2018   OSA treated with BiPAP 01/03/2017   REM sleep behavior disorder 12/06/2016   CKD stage 3 due to type 2 diabetes mellitus (HCC) 10/23/2016   Medication management 10/23/2016   Vitamin D deficiency 10/23/2016   Parkinson's disease (HCC) 01/04/2016   Seasonal and perennial allergic rhinitis 06/21/2014   Asthma, mild intermittent, well-controlled 06/21/2014   Pseudomonas aeruginosa colonization 10/08/2012   CAD (coronary artery disease) 11/15/2011   Essential hypertension 11/15/2011   Morbid obesity with BMI of 40.0-44.9, adult (HCC) 11/15/2011   Past Medical History:  Diagnosis Date   Adult BMI 50.0-59.9 kg/sq m    52.77   Allergy    Anal fissure    Anxiety    Asthma    Cancer (HCC)    kidney cancer   Depression    Diabetes mellitus without complication (HCC)    borderline   Elevated PSA 08/13/2019   H/O: gout    STABLE   Hepatic steatosis 10/01/2011   History of kidney stones    Hyperlipidemia    Hypertension    Kidney tumor    right upper kidney tumor, monitoring   OSA on CPAP    [facility] [facility]   Parkinson disease (HCC) 01/04/2016   REM sleep behavior disorder 12/06/2016   Right ureteral stone    Sleep apnea    Ureteral calculus, right 06/12/2011   Vitamin D deficiency    Weakness    Past Surgical History:   Procedure Laterality Date   ANAL FISSURE REPAIR  10/12/2000   COLONOSCOPY N/A 07/01/2012   Procedure: COLONOSCOPY;  Surgeon: Hart Carwin, MD;  Location: [facility];  Service: Endoscopy;  Laterality: N/A;   COLONOSCOPY  11/23/2022   CYSTOSCOPY WITH RETROGRADE PYELOGRAM, URETEROSCOPY AND STENT PLACEMENT Bilateral 02/26/2013   Procedure: CYSTOSCOPY WITH RETROGRADE PYELOGRAM, URETEROSCOPY AND STENT PLACEMENT;  Surgeon: Sebastian Ache, MD;  Location: [facility];  Service: Urology;  Laterality: Bilateral;   HOLMIUM LASER APPLICATION Bilateral 02/26/2013   Procedure: HOLMIUM LASER APPLICATION;  Surgeon: Sebastian Ache, MD;  Location: [facility];  Service: Urology;  Laterality: Bilateral;   kidney stone removal     06/2011-stent placement   LEFT MEDIAL FEMORAL CONDYLE DEBRIDEMENT & DRILLING/ REMOVAL LOOSE BODY  09/15/2003   NASAL SEPTUM SURGERY  05/09/1983   ROBOT ASSISTED LAPAROSCOPIC NEPHRECTOMY Right 12/16/2021   Procedure: XI ROBOTIC ASSISTED LAPAROSCOPIC RADICAL NEPHRECTOMY;  Surgeon: Sebastian Ache, MD;  Location: [facility];  Service: Urology;  Laterality: Right;  3 HRS   STONE EXTRACTION WITH BASKET  06/12/2011   Procedure: STONE EXTRACTION WITH BASKET;  Surgeon: Garnett Farm, MD;  Location: [facility];  Service: Urology;  Laterality: Right;   Family History  Problem Relation Age of Onset   Heart disease Mother        Atrial fibrillation   Hypertension Mother    Hyperlipidemia Mother    Lung cancer Father        was a smoker   Cancer Father        lung   Hyperlipidemia Brother    Melanoma Brother    Heart disease Brother        Amyloid   Heart disease Maternal Aunt    Hyperlipidemia Maternal Aunt    Hypertension Maternal Aunt    Stroke Maternal Aunt    Heart disease Paternal Grandmother    Hyperlipidemia Paternal Grandmother    Parkinson's disease Neg Hx    Sleep apnea Neg Hx    Colon cancer Neg Hx    Rectal cancer Neg Hx    Stomach cancer Neg Hx    Social  History   Socioeconomic History   Marital status: Married    Spouse name: Dawn   Number of children: 0   Years of education: Not on file   Highest education level: Some college, no degree  Occupational History    Comment: retired  Tobacco Use   Smoking status: Former    Types: Cigars   Smokeless tobacco: Never  Vaping Use   Vaping status: Never Used  Substance and Sexual Activity   Alcohol use: No   Drug use: No    Comment: QUIT SMOKING " POT" 40 YRS AGO   Sexual activity: Yes    Partners: Female  Other Topics Concern   Not on file  Social History Narrative   Right-handed.   No caffeine use.   Lives at home with his wife.   Social Drivers of Corporate investment banker Strain: Low Risk  (08/02/2023)   Overall Financial Resource Strain (CARDIA)    Difficulty of Paying Living Expenses: Not hard at all  Food Insecurity: No Food Insecurity (08/02/2023)   Hunger Vital Sign    Worried About Running Out of Food in the Last Year: Never true    Ran Out of Food in the Last Year: Never true  Transportation Needs: No Transportation Needs (08/02/2023)   PRAPARE - Administrator, Civil Service (Medical): No    Lack of Transportation (Non-Medical): No  Physical Activity: Insufficiently Active (08/02/2023)   Exercise Vital Sign    Days of Exercise per Week: 2 days    Minutes of Exercise per Session: 20 min  Stress: No Stress Concern Present (08/02/2023)   [employer] of Occupational Health - Occupational Stress Questionnaire    Feeling of Stress : Only a little  Social Connections: Socially Integrated (08/02/2023)   Social Connection and Isolation Panel [NHANES]    Frequency of Communication with Friends and Family: More than three times a week    Frequency of Social Gatherings with Friends and Family: Twice a week    Attends Religious Services: More than 4 times per year    Active Member of [employer] or Organizations: Yes    Attends Engineer, structural: More  than 4 times per year    Marital Status: Married  Catering manager Violence: Not on file   Outpatient Medications Prior to Visit  Medication Sig Dispense Refill   albuterol (VENTOLIN HFA) 108 (90 Base) MCG/ACT inhaler INHALE 2 PUFFS EVERY 4 HOURS (15 MINUTES APART) FOR ASTHMA RESCUE 6.7 g 1   allopurinol (ZYLOPRIM) 300 MG tablet Take 1 tablet (300 mg total) by mouth daily to prevent gout. 90 tablet 3   ALPRAZolam (XANAX) 0.5 MG tablet Take 1 tab up to three times a day only if needed for severe anxiety or panic attack. Avoid daily use. 60 tablet 0   aspirin EC 81 MG tablet Take 81 mg by mouth daily. Swallow whole.     calcium carbonate (TUMS - DOSED IN MG ELEMENTAL CALCIUM) 500 MG chewable tablet Chew 1 tablet by mouth daily as needed for indigestion or heartburn.     carbidopa-levodopa (SINEMET IR) 25-250 MG tablet Take 1 tablet by mouth 4 (four) times daily. 360 tablet 4   Cholecalciferol (VITAMIN D) 50 MCG (2000 UT) tablet Take 2,000 Units by mouth 2 (two) times daily.     ezetimibe (ZETIA) 10 MG tablet Take 1 tablet (10 mg total) by mouth daily for cholesterol. 90 tablet 3   glipiZIDE (GLUCOTROL) 5 MG tablet Take 1/2 tab as needed for glucose above 150. (Patient taking differently: Take 2.5-5 mg by mouth See admin instructions. Take 2.5 mg as needed for glucose above 150-199, take 5 mg as needed for glucose 200 or higher) 60 tablet 2   glycopyrrolate (ROBINUL) 1 MG tablet Take 1 tablet (1 mg total) by mouth 2 (two) times daily as needed. 60 tablet 11   Magnesium 250 MG TABS Take 1 tablet (250 mg total) by mouth 2 (two) times daily with a meal. (Patient taking differently: Take 1 tablet by mouth daily.)     Menthol, Topical Analgesic, (BIOFREEZE EX) Apply 1 Application topically daily as needed (pain).     metFORMIN (GLUCOPHAGE-XR) 500 MG 24 hr tablet Take 2 tablets (1,000 mg total) by mouth 2 (two) times daily. 360 tablet 3   polyethylene glycol powder (GLYCOLAX/MIRALAX) 17 GM/SCOOP powder  Take 17 g by mouth every other day. Alternating days with senna     pramipexole (MIRAPEX) 1 MG tablet Take 1 tablet (1 mg total) by mouth 3 (three) times daily. 270 tablet 3   senna (SENOKOT) 8.6 MG tablet Take 2 tablets by mouth every other day. Alternating days with miralax     sodium chloride (OCEAN) 0.65 % SOLN nasal spray Place 1 spray into both nostrils as needed for congestion.     valsartan-hydrochlorothiazide (DIOVAN-HCT) 320-25 MG tablet Take 1 tablet by mouth daily for blood pressure 90 tablet 3   rosuvastatin (CRESTOR) 20 MG tablet Take 1 tablet (20 mg total) by mouth daily. 90 tablet 3   Semaglutide, 1 MG/DOSE, (OZEMPIC, 1 MG/DOSE,) 4 MG/3ML SOPN Inject 1 mg into the skin once a week. (Patient not taking: Reported on 08/06/2023) 3 mL 0   Facility-Administered Medications Prior to Visit  Medication Dose Route Frequency Provider Last Rate Last Admin   polyethylene glycol powder (GLYCOLAX/MIRALAX) container 255 g  1 Container Oral Once Loletta Parish., MD       Allergies  Allergen Reactions   Ciprofloxacin Hives   Ceftriaxone Hives    ROS: A complete ROS was performed with pertinent positives/negatives noted in the HPI. The remainder of the ROS are negative.   Objective:   Today's Vitals   08/06/23 0757  BP: 139/77  Pulse: 67  SpO2: 100%  Weight: 246 lb 1.6 oz (111.6 kg)  Height: 5'  6" (1.676 m)    GENERAL: Well-appearing, in NAD. Well nourished.  SKIN: Pink, warm and dry.  Head: Normocephalic. NECK: Trachea midline. Full ROM w/o pain or tenderness.  RESPIRATORY: Chest wall symmetrical. Respirations even and non-labored. Breath sounds clear to auscultation bilaterally.  CARDIAC: S1, S2 present, regular rate and rhythm without murmur or gallops. Peripheral pulses 2+ bilaterally.  MSK: Muscle tone and strength appropriate for age.  EXTREMITIES: Without clubbing, cyanosis. +1 non pitting edema bilateral lower legs.  NEUROLOGIC: No motor or sensory deficits. Steady,  even gait. C2-C12 intact.  PSYCH/MENTAL STATUS: Alert, oriented x 3. Cooperative, appropriate mood and affect.   Diabetic Foot Exam - Simple   Simple Foot Form Diabetic Foot exam was performed with the following findings: Yes 08/06/2023  8:30 AM  Visual Inspection No deformities, no ulcerations, no other skin breakdown bilaterally: Yes Sensation Testing Intact to touch and monofilament testing bilaterally: Yes Pulse Check Posterior Tibialis and Dorsalis pulse intact bilaterally: Yes Comments     Health Maintenance Due  Topic Date Due   Zoster Vaccines- Shingrix (1 of 2) Never done    Results for orders placed or performed in visit on 08/06/23  POCT Urine Drug Screen  Result Value Ref Range   POC Methamphetamine UR     POC Opiate Ur None Detected None Detected   POC Barbiturate UR     POC Amphetamine UR     POC Oxycodone UR     POC Cocaine UR None Detected None Detected   POC Ecstasy UR     POC TRICYCLICS UR     POC PHENCYCLIDINE UR     POC Marijuana UR None Detected None Detected   POC Methadone UR     POC BENZODIAZEPINES UR None Detected None Detected   URINE TEMPERATURE     POC DRUG SCREEN OXIDANTS URINE     POC SPECIFIC GRAVITY URINE     POC PH URINE     Methylenedioxyamphetamine       Morphine none detected        Assessment & Plan:  1. CKD stage 3 due to type 2 diabetes mellitus (HCC) (Primary) Stable.  Will continue with ARB, renal diet, and good hydration therapy.  Will check for possible chronic anemia given CKD and check renal function with CMP today.  Will send results to urology per patient request. - CBC with Differential/Platelet - Comprehensive metabolic panel with GFR  2. Essential hypertension Stable, continue current regimen and regular monitoring of blood pressure at home.  3. Type 2 diabetes mellitus with stage 3a chronic kidney disease, without long-term current use of insulin (HCC) Stable per previous A1c.  Will repeat A1c with lab work today  and renal function with CMP.  Discussed good dietary changes and regular exercise. Patient assistance form from [employer] provided to patient to complete for evaluation of ozempic cost coverage. Will return to the office.  - Comprehensive metabolic panel with GFR - Hemoglobin A1c  4. Hyperlipidemia associated with type 2 diabetes mellitus (HCC) Stable per previous lipid panel.  Check in 3 months.  Continue on current regimen with Zetia and Crestor refilled. - rosuvastatin (CRESTOR) 20 MG tablet; Take 1 tablet (20 mg total) by mouth at bedtime.  Dispense: 90 tablet; Refill: 3  5. GAD (generalized anxiety disorder) Stable per patient at this time.  He takes alprazolam 0.5 mg as needed for severe anxiety or panic attacks.  Patient to complete UDS and updated controlled substance agreement at this time for  pending refills.  Safe use of medication reviewed with patient. - POCT Urine Drug Screen   Patient to reach out to office if new, worrisome, or unresolved symptoms arise or if no improvement in patient's condition. Patient verbalized understanding and is agreeable to treatment plan. All questions answered to patient's satisfaction.    Return in about 3 months (around 11/05/2023) for Follow up Chronic Conditions (fasting labs prior) .    Hilbert Bible, [place]

## 2023-08-06 NOTE — Patient Instructions (Signed)
Shingrix Vaccine

## 2023-08-07 ENCOUNTER — Encounter (HOSPITAL_BASED_OUTPATIENT_CLINIC_OR_DEPARTMENT_OTHER): Payer: Self-pay | Admitting: Family Medicine

## 2023-08-07 NOTE — Progress Notes (Signed)
 Hi Doug, Your kidney function is stable.  Your A1c is also stable at 7.1 and your complete blood count is normal.  We will continue on your current regimen.  We will plan to repeat these in July at your next appointment.  If you have any questions please let me know

## 2023-08-13 ENCOUNTER — Other Ambulatory Visit (HOSPITAL_BASED_OUTPATIENT_CLINIC_OR_DEPARTMENT_OTHER): Payer: Self-pay

## 2023-08-13 ENCOUNTER — Ambulatory Visit: Payer: Medicare Other | Admitting: Adult Health

## 2023-08-13 ENCOUNTER — Telehealth: Payer: Self-pay | Admitting: Adult Health

## 2023-08-13 ENCOUNTER — Encounter: Payer: Self-pay | Admitting: Adult Health

## 2023-08-13 VITALS — BP 155/80 | HR 64 | Ht 67.0 in | Wt 247.0 lb

## 2023-08-13 DIAGNOSIS — G20B1 Parkinson's disease with dyskinesia, without mention of fluctuations: Secondary | ICD-10-CM | POA: Diagnosis not present

## 2023-08-13 DIAGNOSIS — G4733 Obstructive sleep apnea (adult) (pediatric): Secondary | ICD-10-CM | POA: Diagnosis not present

## 2023-08-13 NOTE — Telephone Encounter (Signed)
 Referral for physical therapy fax to [facility]. Phone: [phone], Fax: [phone]

## 2023-08-13 NOTE — Patient Instructions (Signed)
 Continue using BiPAP Continue on Sinemet 25-250 mg 1 tablet 4 times a day Continue Mirapex 1 mg 3 times a day Referral placed for physical therapy

## 2023-08-13 NOTE — Progress Notes (Signed)
 PATIENT: Todd Rice DOB: 1955-12-25  REASON FOR VISIT: follow up HISTORY FROM: patient PRIMARY NEUROLOGIST: Dr. Marjory Lies  Chief Complaint  Patient presents with   Room 20    Pt is here with his Wife. Pt states that his body had locked up on him while he was trying to get in the car, and he urinated on himself due to him not being able to move. Pt states that his right hand tremors really bad and he has a hard time picking things up with both of his hands. Pt would like to talk about therapy and having more appointments instead of being seen yearly.      HISTORY OF PRESENT ILLNESS: Today 08/13/23:  Todd Rice is a 68 y.o. male with a history of Parkinson's disease and obstructive sleep apnea on BiPAP. Returns today for follow-up.  Patient continues to have a resting tremor in the right upper extremity.  He states that it may be slightly worse.  Denies any trouble chewing or swallowing food.  Sometimes has trouble swallowing his Sinemet tablet.  Reports that he has to drink a lot of water.  Denies any trouble sleeping.  No change in mood or behavior.  Saw Dr. Debarah Crape for cell Verline Lema she placed him on glycopyrrolate.  He feels that this has been helping.  He only takes it as needed.  Remains on Sinemet 1 tablet 7 AM, 11 AM, 3 PM and 7 PM.  Reports that in February he was getting into his truck and when he went to step up he froze.  Was unable to move despite telling his legs to move.  He states he also had urinary incontinence.  However he was completely alert during the entire event.  No seizure-like symptoms.  Reports that he has not had any additional symptoms since then.  Discussed with Dr. Terrace Arabia.  She did not feel that this was a seizure episode.  For now we will continue to monitor.  Reports that BiPAP is working well.  He does not feel the mask leaking.  His download is below   UPDATE (10/30/22, VRP): Since last visit, doing well. Symptoms are improved on high carb/levo dosing.  Severity is mild. No alleviating or aggravating factors. Tolerating meds.  Some issues with sialorrhea. Some oral dyskinesias (randomly).   09/05/22: Todd Rice is a 68 y.o. male with a history of OSA on CPAP and Parkinson disease. Returns today for follow-up.  He reports that his CPAP is working well.  He continues to have a fullface beard and the mask leaks but is not bothersome to him.  He is a mouth breather so he does not want to try a nasal mask.   Parkinson's-  feels like medication doesn't last until the next dose. Currently taking sinemet 25-250 mg TID-- 6 AM, 12 PM and 6 PM. Notices trouble with balance. No falls. Tremor in the right hand- comes and goes. No trouble swallowing or chewing foods. Does drool out of the left side.   Vivid dreams have gotten better.     02/08/2022: Todd Rice is a 68 year old male with a history of obstructive sleep apnea on CPAP and Parkinson's disease.  He returns today for follow-up.No changes with gait or balance. Some days are better than others. Has PT regularly. No trouble chewing or swallowing food. Has noticed 2 dreams that are very vivid. Tremor remains in the right arm- stable.  Remains on Sinemet 25-250 mg 3 times a day and  Mirapex 1 mg 3 times a day.  He reports that he shaved his beard that his mask is fitting better.  This just occurred in the last 1 to 2 weeks.  Current download still shows a significant leak but some of this data was prior to him shaving his beard  Had cancer and right kidney was removed.     08/04/21: Todd Rice is a 68 year old male with a history of patient seen today for initial BiPAP compliance visit after receiving a new machine.  He was just seen in January for evaluation of Parkinson's.  His BiPAP report is below.  He reports he does feel his mask leaking.  He reports that it may be because of his beard.    06/07/21:   Todd Rice is a 68 year old male with a history of Parkinson's disease.  He returns today  for follow-up.  The patient continues to have a tremor in the right arm.  He states it is worse with stress.  Reports that his balance is a little off.  He is going to therapy.  Has an evaluation every 6 months.  His next evaluation is March 6.  Denies any trouble with his swallowing.  Currently has a new CPAP machine.  Reports that he is sleeping on average about 5 hours most nights.  He has an appointment next month for initial CPAP compliance.     REVIEW OF SYSTEMS: Out of a complete 14 system review of symptoms, the patient complains only of the following symptoms, and all other reviewed systems are negative.  FSS 11 ESS1   ALLERGIES: Allergies  Allergen Reactions   Ciprofloxacin Hives   Ceftriaxone Hives    HOME MEDICATIONS: Outpatient Medications Prior to Visit  Medication Sig Dispense Refill   albuterol (VENTOLIN HFA) 108 (90 Base) MCG/ACT inhaler INHALE 2 PUFFS EVERY 4 HOURS (15 MINUTES APART) FOR ASTHMA RESCUE 6.7 g 1   allopurinol (ZYLOPRIM) 300 MG tablet Take 1 tablet (300 mg total) by mouth daily to prevent gout. 90 tablet 3   ALPRAZolam (XANAX) 0.5 MG tablet Take 1 tab up to three times a day only if needed for severe anxiety or panic attack. Avoid daily use. 60 tablet 0   aspirin EC 81 MG tablet Take 81 mg by mouth daily. Swallow whole.     calcium carbonate (TUMS - DOSED IN MG ELEMENTAL CALCIUM) 500 MG chewable tablet Chew 1 tablet by mouth daily as needed for indigestion or heartburn.     carbidopa-levodopa (SINEMET IR) 25-250 MG tablet Take 1 tablet by mouth 4 (four) times daily. 360 tablet 4   Cholecalciferol (VITAMIN D) 50 MCG (2000 UT) tablet Take 2,000 Units by mouth 2 (two) times daily.     ezetimibe (ZETIA) 10 MG tablet Take 1 tablet (10 mg total) by mouth daily for cholesterol. 90 tablet 3   glipiZIDE (GLUCOTROL) 5 MG tablet Take 1/2 tab as needed for glucose above 150. (Patient taking differently: Take 2.5-5 mg by mouth See admin instructions. Take 2.5 mg as  needed for glucose above 150-199, take 5 mg as needed for glucose 200 or higher) 60 tablet 2   glycopyrrolate (ROBINUL) 1 MG tablet Take 1 tablet (1 mg total) by mouth 2 (two) times daily as needed. 60 tablet 11   Magnesium 250 MG TABS Take 1 tablet (250 mg total) by mouth 2 (two) times daily with a meal. (Patient taking differently: Take 1 tablet by mouth daily.)     Menthol, Topical Analgesic, (BIOFREEZE  EX) Apply 1 Application topically daily as needed (pain).     metFORMIN (GLUCOPHAGE-XR) 500 MG 24 hr tablet Take 2 tablets (1,000 mg total) by mouth 2 (two) times daily. 360 tablet 3   polyethylene glycol powder (GLYCOLAX/MIRALAX) 17 GM/SCOOP powder Take 17 g by mouth every other day. Alternating days with senna     pramipexole (MIRAPEX) 1 MG tablet Take 1 tablet (1 mg total) by mouth 3 (three) times daily. 270 tablet 3   rosuvastatin (CRESTOR) 20 MG tablet Take 1 tablet (20 mg total) by mouth at bedtime. 90 tablet 3   Semaglutide, 1 MG/DOSE, (OZEMPIC, 1 MG/DOSE,) 4 MG/3ML SOPN Inject 1 mg into the skin once a week. 3 mL 0   senna (SENOKOT) 8.6 MG tablet Take 2 tablets by mouth every other day. Alternating days with miralax     sodium chloride (OCEAN) 0.65 % SOLN nasal spray Place 1 spray into both nostrils as needed for congestion.     valsartan-hydrochlorothiazide (DIOVAN-HCT) 320-25 MG tablet Take 1 tablet by mouth daily for blood pressure 90 tablet 3   Facility-Administered Medications Prior to Visit  Medication Dose Route Frequency Provider Last Rate Last Admin   polyethylene glycol powder (GLYCOLAX/MIRALAX) container 255 g  1 Container Oral Once Loletta Parish., MD        PAST MEDICAL HISTORY: Past Medical History:  Diagnosis Date   Adult BMI 50.0-59.9 kg/sq m    52.77   Allergy    Anal fissure    Anxiety    Asthma    Cancer (HCC)    kidney cancer   Depression    Diabetes mellitus without complication (HCC)    borderline   Elevated PSA 08/13/2019   H/O: gout    STABLE    Hepatic steatosis 10/01/2011   History of kidney stones    Hyperlipidemia    Hypertension    Kidney tumor    right upper kidney tumor, monitoring   OSA on CPAP    [facility]   Parkinson disease (HCC) 01/04/2016   REM sleep behavior disorder 12/06/2016   Right ureteral stone    Sleep apnea    Ureteral calculus, right 06/12/2011   Vitamin D deficiency    Weakness     PAST SURGICAL HISTORY: Past Surgical History:  Procedure Laterality Date   ANAL FISSURE REPAIR  10/12/2000   COLONOSCOPY N/A 07/01/2012   Procedure: COLONOSCOPY;  Surgeon: Hart Carwin, MD;  Location: [facility];  Service: Endoscopy;  Laterality: N/A;   COLONOSCOPY  11/23/2022   CYSTOSCOPY WITH RETROGRADE PYELOGRAM, URETEROSCOPY AND STENT PLACEMENT Bilateral 02/26/2013   Procedure: CYSTOSCOPY WITH RETROGRADE PYELOGRAM, URETEROSCOPY AND STENT PLACEMENT;  Surgeon: Sebastian Ache, MD;  Location: [facility];  Service: Urology;  Laterality: Bilateral;   HOLMIUM LASER APPLICATION Bilateral 02/26/2013   Procedure: HOLMIUM LASER APPLICATION;  Surgeon: Sebastian Ache, MD;  Location: [facility];  Service: Urology;  Laterality: Bilateral;   kidney stone removal     06/2011-stent placement   LEFT MEDIAL FEMORAL CONDYLE DEBRIDEMENT & DRILLING/ REMOVAL LOOSE BODY  09/15/2003   NASAL SEPTUM SURGERY  05/09/1983   ROBOT ASSISTED LAPAROSCOPIC NEPHRECTOMY Right 12/16/2021   Procedure: XI ROBOTIC ASSISTED LAPAROSCOPIC RADICAL NEPHRECTOMY;  Surgeon: Sebastian Ache, MD;  Location: [facility];  Service: Urology;  Laterality: Right;  3 HRS   STONE EXTRACTION WITH BASKET  06/12/2011   Procedure: STONE EXTRACTION WITH BASKET;  Surgeon: Garnett Farm, MD;  [facility];  Service: Urology;  Laterality:  Right;    FAMILY HISTORY: Family History  Problem Relation Age of Onset   Heart disease Mother        Atrial fibrillation   Hypertension Mother    Hyperlipidemia Mother    Lung cancer Father        was a smoker    Cancer Father        lung   Hyperlipidemia Brother    Melanoma Brother    Heart disease Brother        Amyloid   Heart disease Maternal Aunt    Hyperlipidemia Maternal Aunt    Hypertension Maternal Aunt    Stroke Maternal Aunt    Heart disease Paternal Grandmother    Hyperlipidemia Paternal Grandmother    Parkinson's disease Neg Hx    Sleep apnea Neg Hx    Colon cancer Neg Hx    Rectal cancer Neg Hx    Stomach cancer Neg Hx     SOCIAL HISTORY: Social History   Socioeconomic History   Marital status: Married    Spouse name: Dawn   Number of children: 0   Years of education: Not on file   Highest education level: Some college, no degree  Occupational History    Comment: retired  Tobacco Use   Smoking status: Former    Types: Cigars   Smokeless tobacco: Never  Vaping Use   Vaping status: Never Used  Substance and Sexual Activity   Alcohol use: No   Drug use: No    Comment: QUIT SMOKING " POT" 40 YRS AGO   Sexual activity: Yes    Partners: Female  Other Topics Concern   Not on file  Social History Narrative   Right-handed.   No caffeine use.   Lives at home with his wife.   Social Drivers of Corporate investment banker Strain: Low Risk  (08/02/2023)   Overall Financial Resource Strain (CARDIA)    Difficulty of Paying Living Expenses: Not hard at all  Food Insecurity: No Food Insecurity (08/02/2023)   Hunger Vital Sign    Worried About Running Out of Food in the Last Year: Never true    Ran Out of Food in the Last Year: Never true  Transportation Needs: No Transportation Needs (08/02/2023)   PRAPARE - Administrator, Civil Service (Medical): No    Lack of Transportation (Non-Medical): No  Physical Activity: Insufficiently Active (08/02/2023)   Exercise Vital Sign    Days of Exercise per Week: 2 days    Minutes of Exercise per Session: 20 min  Stress: No Stress Concern Present (08/02/2023)   [employer] of Occupational Health - Occupational  Stress Questionnaire    Feeling of Stress : Only a little  Social Connections: Socially Integrated (08/02/2023)   Social Connection and Isolation Panel [NHANES]    Frequency of Communication with Friends and Family: More than three times a week    Frequency of Social Gatherings with Friends and Family: Twice a week    Attends Religious Services: More than 4 times per year    Active Member of [employer] or Organizations: Yes    Attends Engineer, structural: More than 4 times per year    Marital Status: Married  Catering manager Violence: Not on file      PHYSICAL EXAM  Vitals:   08/13/23 1410  BP: (!) 155/80  Pulse: 64  Weight: 247 lb (112 kg)  Height: 5\' 7"  (1.702 m)   Body mass index is  38.69 kg/m.  Generalized: Well developed, in no acute distress    Neurological examination  Mentation: Alert oriented to time, place, history taking. Follows all commands speech and language fluent Cranial nerve II-XII: Extraocular movements were full, visual field were full on confrontational test Head turning and shoulder shrug  were normal and symmetric.  Involuntary movement noted in the jaw Motor: The motor testing reveals 5 over 5 strength of all 4 extremities. Good symmetric motor tone is noted throughout. Resting tremor noted in right hand. Finger taps- moderate impairment bilaterally, toe taps- moderate impairment bilaterally. Sensory: Sensory testing is intact to soft touch on all 4 extremities. No evidence of extinction is noted.  Gait and station: Able to stand without assistance good stride, slightly decreased arm swing steps with turns.   DIAGNOSTIC DATA (LABS, IMAGING, TESTING) - I reviewed patient records, labs, notes, testing and imaging myself where available.  Lab Results  Component Value Date   WBC 7.0 08/06/2023   HGB 14.0 08/06/2023   HCT 42.6 08/06/2023   MCV 87 08/06/2023   PLT 164 08/06/2023      Component Value Date/Time   NA 139 08/06/2023 0919   K 4.6  08/06/2023 0919   CL 99 08/06/2023 0919   CO2 21 08/06/2023 0919   GLUCOSE 143 (H) 08/06/2023 0919   GLUCOSE 133 (H) 04/24/2023 0947   BUN 31 (H) 08/06/2023 0919   CREATININE 1.75 (H) 08/06/2023 0919   CREATININE 1.63 (H) 04/24/2023 0947   CALCIUM 10.4 (H) 08/06/2023 0919   PROT 7.2 08/06/2023 0919   ALBUMIN 4.2 08/06/2023 0919   AST 13 08/06/2023 0919   ALT 7 08/06/2023 0919   ALKPHOS 62 08/06/2023 0919   BILITOT 0.3 08/06/2023 0919   GFRNONAA 40 (L) 12/18/2021 0525   GFRNONAA 58 (L) 09/27/2020 1008   GFRAA 68 09/27/2020 1008   Lab Results  Component Value Date   CHOL 103 04/24/2023   HDL 35 (L) 04/24/2023   LDLCALC 45 04/24/2023   TRIG 147 04/24/2023   CHOLHDL 2.9 04/24/2023   Lab Results  Component Value Date   HGBA1C 7.1 (H) 08/06/2023   Lab Results  Component Value Date   VITAMINB12 484 04/24/2023   Lab Results  Component Value Date   TSH 3.84 04/24/2023      ASSESSMENT AND PLAN 68 y.o. year old male  has a past medical history of Adult BMI 50.0-59.9 kg/sq m, Allergy, Anal fissure, Anxiety, Asthma, Cancer (HCC), Depression, Diabetes mellitus without complication (HCC), Elevated PSA (08/13/2019), H/O: gout, Hepatic steatosis (10/01/2011), History of kidney stones, Hyperlipidemia, Hypertension, Kidney tumor, OSA on CPAP, Parkinson disease (HCC) (01/04/2016), REM sleep behavior disorder (12/06/2016), Right ureteral stone, Sleep apnea, Ureteral calculus, right (06/12/2011), Vitamin D deficiency, and Weakness. here with:  OSA on BiPAP  - biPAP compliance excellent - Residual AHI remains slightly elevated but improved.  Most likely due to leakage since he has a fullface beard. - Encourage patient to use biPAP nightly and > 4 hours each night  2.  Parkinson's disease  - Continue Sinemet 25-250 mg 1 tablet 4 times a day spaced out by 4 hours.   - Continue Mirapex 1 mg 3 times a day - Referral placed for physical therapy  - F/U in 6 months or sooner if  needed    Butch Penny, MSN, NP-C 08/13/2023, 2:35 PM [facility] [address] [place], [place] [address] [phone]

## 2023-08-14 ENCOUNTER — Other Ambulatory Visit (HOSPITAL_BASED_OUTPATIENT_CLINIC_OR_DEPARTMENT_OTHER): Payer: Self-pay

## 2023-08-14 ENCOUNTER — Other Ambulatory Visit: Payer: Self-pay

## 2023-08-14 NOTE — Progress Notes (Signed)
 Penumalli, Glenford Bayley, MD  Butch Penny, NP Agree. VRP       Previous Messages    ----- Message ----- From: Butch Penny, NP Sent: 08/13/2023   3:59 PM EDT To: Suanne Marker, MD  Patient would like to switch to Dr. Terrace Arabia as her primary neurologist.  I discussed with her and she was amenable.  Are you okay with that

## 2023-08-23 ENCOUNTER — Ambulatory Visit: Attending: Adult Health | Admitting: Physical Therapy

## 2023-08-23 ENCOUNTER — Other Ambulatory Visit: Payer: Self-pay

## 2023-08-23 DIAGNOSIS — R2689 Other abnormalities of gait and mobility: Secondary | ICD-10-CM | POA: Diagnosis present

## 2023-08-23 DIAGNOSIS — G20B1 Parkinson's disease with dyskinesia, without mention of fluctuations: Secondary | ICD-10-CM | POA: Diagnosis not present

## 2023-08-23 DIAGNOSIS — R2681 Unsteadiness on feet: Secondary | ICD-10-CM | POA: Diagnosis present

## 2023-08-23 NOTE — Therapy (Signed)
 OUTPATIENT PHYSICAL THERAPY NEURO EVALUATION   Patient Name: Todd Rice MRN: [id number] DOB:1955-06-23, 68 y.o., male Today's Date: 08/23/2023   PCP: Nonda Bays, FNP REFERRING PROVIDER: Clem Currier, NP   END OF SESSION:  PT End of Session - 08/23/23 0926     Visit Number 1    Number of Visits 9    Date for PT Re-Evaluation 09/21/23    Authorization Type [employer] Medicare    PT Start Time 0931    PT Stop Time 1015    PT Time Calculation (min) 44 min    Activity Tolerance Patient tolerated treatment well    Behavior During Therapy [facility] for tasks assessed/performed             Past Medical History:  Diagnosis Date   Adult BMI 50.0-59.9 kg/sq m    52.77   Allergy    Anal fissure    Anxiety    Asthma    Cancer (HCC)    kidney cancer   Depression    Diabetes mellitus without complication (HCC)    borderline   Elevated PSA 08/13/2019   H/O: gout    STABLE   Hepatic steatosis 10/01/2011   History of kidney stones    Hyperlipidemia    Hypertension    Kidney tumor    right upper kidney tumor, monitoring   OSA on CPAP    [employer] [facility]   Parkinson disease (HCC) 01/04/2016   REM sleep behavior disorder 12/06/2016   Right ureteral stone    Sleep apnea    Ureteral calculus, right 06/12/2011   Vitamin D deficiency    Weakness    Past Surgical History:  Procedure Laterality Date   ANAL FISSURE REPAIR  10/12/2000   COLONOSCOPY N/A 07/01/2012   Procedure: COLONOSCOPY;  Surgeon: Pietro Bridegroom, MD;  Location: [facility];  Service: Endoscopy;  Laterality: N/A;   COLONOSCOPY  11/23/2022   CYSTOSCOPY WITH RETROGRADE PYELOGRAM, URETEROSCOPY AND STENT PLACEMENT Bilateral 02/26/2013   Procedure: CYSTOSCOPY WITH RETROGRADE PYELOGRAM, URETEROSCOPY AND STENT PLACEMENT;  Surgeon: Osborn Blaze, MD;  Location: [facility];  Service: Urology;  Laterality: Bilateral;   HOLMIUM LASER APPLICATION Bilateral 02/26/2013   Procedure: HOLMIUM LASER APPLICATION;  Surgeon:  Osborn Blaze, MD;  Location: [facility];  Service: Urology;  Laterality: Bilateral;   kidney stone removal     06/2011-stent placement   LEFT MEDIAL FEMORAL CONDYLE DEBRIDEMENT & DRILLING/ REMOVAL LOOSE BODY  09/15/2003   NASAL SEPTUM SURGERY  05/09/1983   ROBOT ASSISTED LAPAROSCOPIC NEPHRECTOMY Right 12/16/2021   Procedure: XI ROBOTIC ASSISTED LAPAROSCOPIC RADICAL NEPHRECTOMY;  Surgeon: Osborn Blaze, MD;  Location: [facility];  Service: Urology;  Laterality: Right;  3 HRS   STONE EXTRACTION WITH BASKET  06/12/2011   Procedure: STONE EXTRACTION WITH BASKET;  Surgeon: Mark C Ottelin, MD;  Location: [facility];  Service: Urology;  Laterality: Right;   Patient Active Problem List   Diagnosis Date Noted   GAD (generalized anxiety disorder) 08/06/2023   Sialorrhea 12/28/2022   Renal mass 12/16/2021   Constipation 03/24/2021   Gout 03/23/2021   Primary renal papillary carcinoma, 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   Hepatic steatosis 02/18/2020   Major depression in remission (HCC) 11/11/2019   Central sleep apnea due to Cheyne-Stokes respiration 08/31/2019   Hyperlipidemia associated with type 2 diabetes mellitus (HCC) 07/09/2019   Aortic atherosclerosis (HCC) by CXR on 06/10/2018 06/10/2018   Type 2 diabetes mellitus with stage 3a chronic kidney disease, without long-term current use of  insulin (HCC) 01/21/2018   OSA treated with BiPAP 01/03/2017   REM sleep behavior disorder 12/06/2016   CKD stage 3 due to type 2 diabetes mellitus (HCC) 10/23/2016   Medication management 10/23/2016   Vitamin D deficiency 10/23/2016   Parkinson's disease (HCC) 01/04/2016   Seasonal and perennial allergic rhinitis 06/21/2014   Asthma, mild intermittent, well-controlled 06/21/2014   Pseudomonas aeruginosa colonization 10/08/2012   CAD (coronary artery disease) 11/15/2011   Essential hypertension 11/15/2011   Morbid obesity with BMI of 40.0-44.9, adult (HCC) 11/15/2011    ONSET DATE: 08/13/2023 (MD  referral)  REFERRING DIAG: G20.B1 (ICD-10-CM) - Parkinson's disease with dyskinesia without fluctuating manifestations (HCC)   THERAPY DIAG:  Unsteadiness on feet  Other abnormalities of gait and mobility  Rationale for Evaluation and Treatment: Rehabilitation  SUBJECTIVE:                                                                                                                                                                                             SUBJECTIVE STATEMENT: It's my balance again.  Felt better after last bout of therapy.  Especially with backing up or standing with my eyes closed.  Did experience episode of "locking up" half-way in and out of truck, in the cold weather.  No other episodes of this. Pt accompanied by: self  PERTINENT HISTORY: See above  PAIN:  Are you having pain?  Sore back, but not typical (due to excess yard work)  PRECAUTIONS: Fall  RED FLAGS: None   WEIGHT BEARING RESTRICTIONS: No  FALLS: Has patient fallen in last 6 months? Yes. Number of falls 1 fall, getting out of bed  LIVING ENVIRONMENT: Lives with: lives with their spouse Lives in: House/apartment Stairs:  12 steps with rail Has following equipment at home:  walking pole  PLOF: Independent No current exercise program; does yard work  PATIENT GOALS: To get my confidence back, especially in shower  OBJECTIVE:  Note: Objective measures were completed at Evaluation unless otherwise noted.  DIAGNOSTIC FINDINGS: NA  COGNITION: Overall cognitive status: Within functional limits for tasks assessed   SENSATION: Light touch: WFL  MUSCLE TONE: LLE: Mild  POSTURE: rounded shoulders and forward head  LOWER EXTREMITY ROM:   Active ROM WFL   LOWER EXTREMITY MMT:  Grossly tested at least 4+/5   TRANSFERS: Sit to stand: Modified independence  Assistive device utilized: None     Stand to sit: Modified independence  Assistive device utilized: None      GAIT: Gait pattern:   Decreased L arm swing, decreased step length  FUNCTIONAL TESTS:  5 times sit to stand: 11.47  sec Timed up and go (TUG): 13.22 sec 10 meter walk test: 10.75 sec (3.05 ft/sec); with Stroop 14.72 sec (2.23 ft/sec) Mini-BESTest: 25/28 TUG cognitive:  13.25 sec 3 M walk backwards:  7.22 sec 360 turn R:  4.56 sec, 8 steps 360 turn L:  4.75 sec, 9 steps   [facility] PT Assessment - 08/23/23 0948       Standardized Balance Assessment   Standardized Balance Assessment Mini-BESTest      Mini-BESTest   Sit To Stand Normal: Comes to stand without use of hands and stabilizes independently.    Rise to Toes Moderate: Heels up, but not full range (smaller than when holding hands), OR noticeable instability for 3 s.    Stand on one leg (left) Moderate: < 20 s   5.75, 12.78 sec   Stand on one leg (right) Moderate: < 20 s   20.28, 18.75 sec   Stand on one leg - lowest score 1    Compensatory Stepping Correction - Forward Normal: Recovers independently with a single, large step (second realignement is allowed).    Compensatory Stepping Correction - Backward Normal: Recovers independently with a single, large step    Compensatory Stepping Correction - Left Lateral Normal: Recovers independently with 1 step (crossover or lateral OK)    Compensatory Stepping Correction - Right Lateral Normal: Recovers independently with 1 step (crossover or lateral OK)    Stepping Corredtion Lateral - lowest score 2    Stance - Feet together, eyes open, firm surface  Normal: 30s    Stance - Feet together, eyes closed, foam surface  Normal: 30s    Incline - Eyes Closed Normal: Stands independently 30s and aligns with gravity    Change in Gait Speed Normal: Significantly changes walkling speed without imbalance    Walk with head turns - Horizontal Moderate: performs head turns with reduction in gait speed.   6.7-7.69 sec   Walk with pivot turns Normal: Turns with feet close FAST (< 3 steps) with good balance.    Step over  obstacles Normal: Able to step over box with minimal change of gait speed and with good balance.    Timed UP & GO with Dual Task Normal: No noticeable change in sitting, standing or walking while backward counting when compared to TUG without    Mini-BEST total score 25             *Comparison to discharge numbers below: 02/15/2023*    FTSTS:  10.56 sec    TUG 10.72 sec    Gait velocity 33M:  10.34 sec (3.17 ft/sec) 33M fast:  7.56 sec (4.34 ft/sec)    360 turn R:  7 steps, 3.68 sec 360 turn L:  7 steps, 3.72 sec    SLS RLE:  EO:  10.87; EC 5.28 sec SLS LLE: EO:  12.47, EC 10.28  TREATMENT DATE: 08/23/2023    PATIENT EDUCATION: Education details: Eval results, POC; re-start Romberg balance position of previous HEP-see below Person educated: Patient Education method: Explanation, Demonstration, and Handouts Education comprehension: verbalized understanding, returned demonstration, and needs further education  HOME EXERCISE PROGRAM: *Pulled up previous HEP and reviewed the bolded exercises* Access Code: [email or web address] URL: [email or web address] Date: 02/02/2023 Prepared by: [facility] - Outpatient  Rehab - [facility]   Program Notes Standing on your cushion:-marching in place x 10-alternating forward step taps x 10-alternating side step taps x 10-alternating back step taps x 10*Do this standing by the counter for support as needed   Exercises - Romberg Stance Eyes Closed on Foam Pad  - 1 x daily - 5 x weekly - 1 sets - 3 reps - 30 sec hold - Romberg Stance with Head Nods on Foam Pad  - 1 x daily - 5 x weekly - 1 sets - 10 reps - Romberg Stance on Foam Pad with Head Rotation  - 1 x daily - 5 x weekly - 1 sets - 10 reps - Forward and Backward Monster Walk with Resistance at Ankles and Counter Support  - 1 x daily - 5 x weekly - 1  sets - 3-5 reps - Gastroc Stretch on Wall  - 1-2 x daily - 7 x weekly - 1 sets - 3 reps - 30 sec hold - Standing Bilateral Gastroc Stretch with Step  - 1-2 x daily - 7 x weekly - 1 sets - 30 reps - 30 sec hold - Standing Lumbar Spine Flexion Stretch Counter  - 1-2 x daily - 7 x weekly - 1 sets - 5 reps - 15 sec hold - Standing Quarter Turn with Counter Support  - 1 x daily - 7 x weekly - 1-2 sets - 10 reps  GOALS: Goals reviewed with patient? Yes  SHORT TERM GOALS: = LTGs   LONG TERM GOALS: Target date: 09/21/2023  Pt will be independent with HEP for improved balance, gait. Baseline:  Goal status: INITIAL  2.  Pt will improve TUG score to less than or equal to 10 sec for decreased fall risk.  Baseline: 13.22 sec Goal status: INITIAL  3.  Pt will improve gait velocity to at least 2.6 ft/sec, with novel dual task activity, for improved gait efficiency and safety. Baseline: 3 ft/sec; 2.23 ft/sec with Stroop activity Goal status: INITIAL  4.  Pt will report at least 50% improved confidence with balance with eyes closed, such as in the shower. Baseline:  Goal status: INITIAL   ASSESSMENT:  CLINICAL IMPRESSION: Patient is a 68 y.o. male who was seen today for physical therapy evaluation and treatment for Parkinson's disease.   He reports he feels off-balance more with backwards walking and with eyes closed.  He does have mild sway with EC activities and is slightly slower with TUG and gait velocity scores compared to last bout of therapy, ending 02/2023.  He has had one fall and he has had one episode of "locking up" with getting out of his vehicle, needing wife's assist.  He does demo decreased gait velocity with novel dual task activity today, compared to initial gait velocity.  He would benefit from skilled PT to address high level balance and gait for improved functional mobility and balance.  OBJECTIVE IMPAIRMENTS: Abnormal gait and decreased balance.   ACTIVITY LIMITATIONS:  bathing, dressing, and locomotion level  PARTICIPATION LIMITATIONS: community activity and yard work  PERSONAL FACTORS: 3+ comorbidities: see above  are also affecting patient's functional outcome.   REHAB POTENTIAL: Good  CLINICAL DECISION MAKING: Evolving/moderate complexity  EVALUATION COMPLEXITY: Moderate  PLAN:  PT FREQUENCY: 2x/week  PT DURATION: 4 weeks (may be modified to less duration, due to patient/therapist out of town during this time)  PLANNED INTERVENTIONS: 97750- Physical Performance Testing, 97110-Therapeutic exercises, 97530- Therapeutic activity, [id number]- Neuromuscular re-education, [phone]- Self Care, 91478- Gait training, Patient/Family education, and Balance training  PLAN FOR NEXT SESSION: Fully review previous HEP and update as needed for high level balance, dual task activities   Addalynne Golding W., PT 08/23/2023, 12:11 PM  [facility] at [facility] [address] [place], [place] [address] Phone # [phone] Fax # [phone]

## 2023-09-03 ENCOUNTER — Encounter: Payer: Self-pay | Admitting: Physical Therapy

## 2023-09-03 ENCOUNTER — Ambulatory Visit: Admitting: Physical Therapy

## 2023-09-03 DIAGNOSIS — R2689 Other abnormalities of gait and mobility: Secondary | ICD-10-CM

## 2023-09-03 DIAGNOSIS — R2681 Unsteadiness on feet: Secondary | ICD-10-CM | POA: Diagnosis not present

## 2023-09-03 NOTE — Therapy (Signed)
 OUTPATIENT PHYSICAL THERAPY NEURO TREATMENT NOTE   Patient Name: Todd Rice MRN: [id number] DOB:08/20/1955, 68 y.o., male Today's Date: 09/03/2023   PCP: Nonda Bays, FNP REFERRING PROVIDER: Clem Currier, NP   END OF SESSION:  PT End of Session - 09/03/23 0754     Visit Number 2    Number of Visits 9    Date for PT Re-Evaluation 09/21/23    Authorization Type [employer] Medicare    PT Start Time 0800    PT Stop Time 0842    PT Time Calculation (min) 42 min    Activity Tolerance Patient tolerated treatment well    Behavior During Therapy [facility] for tasks assessed/performed              Past Medical History:  Diagnosis Date   Adult BMI 50.0-59.9 kg/sq m    52.77   Allergy     Anal fissure    Anxiety    Asthma    Cancer (HCC)    kidney cancer   Depression    Diabetes mellitus without complication (HCC)    borderline   Elevated PSA 08/13/2019   H/O: gout    STABLE   Hepatic steatosis 10/01/2011   History of kidney stones    Hyperlipidemia    Hypertension    Kidney tumor    right upper kidney tumor, monitoring   OSA on CPAP    [employer] [facility]   Parkinson disease (HCC) 01/04/2016   REM sleep behavior disorder 12/06/2016   Right ureteral stone    Sleep apnea    Ureteral calculus, right 06/12/2011   Vitamin D  deficiency    Weakness    Past Surgical History:  Procedure Laterality Date   ANAL FISSURE REPAIR  10/12/2000   COLONOSCOPY N/A 07/01/2012   Procedure: COLONOSCOPY;  Surgeon: Pietro Bridegroom, MD;  Location: [facility];  Service: Endoscopy;  Laterality: N/A;   COLONOSCOPY  11/23/2022   CYSTOSCOPY WITH RETROGRADE PYELOGRAM, URETEROSCOPY AND STENT PLACEMENT Bilateral 02/26/2013   Procedure: CYSTOSCOPY WITH RETROGRADE PYELOGRAM, URETEROSCOPY AND STENT PLACEMENT;  Surgeon: Osborn Blaze, MD;  Location: [facility];  Service: Urology;  Laterality: Bilateral;   HOLMIUM LASER APPLICATION Bilateral 02/26/2013   Procedure: HOLMIUM LASER APPLICATION;   Surgeon: Osborn Blaze, MD;  Location: [facility];  Service: Urology;  Laterality: Bilateral;   kidney stone removal     06/2011-stent placement   LEFT MEDIAL FEMORAL CONDYLE DEBRIDEMENT & DRILLING/ REMOVAL LOOSE BODY  09/15/2003   NASAL SEPTUM SURGERY  05/09/1983   ROBOT ASSISTED LAPAROSCOPIC NEPHRECTOMY Right 12/16/2021   Procedure: XI ROBOTIC ASSISTED LAPAROSCOPIC RADICAL NEPHRECTOMY;  Surgeon: Osborn Blaze, MD;  Location: [facility];  Service: Urology;  Laterality: Right;  3 HRS   STONE EXTRACTION WITH BASKET  06/12/2011   Procedure: STONE EXTRACTION WITH BASKET;  Surgeon: Mark C Ottelin, MD;  Location: [facility];  Service: Urology;  Laterality: Right;   Patient Active Problem List   Diagnosis Date Noted   GAD (generalized anxiety disorder) 08/06/2023   Sialorrhea 12/28/2022   Renal mass 12/16/2021   Constipation 03/24/2021   Gout 03/23/2021   Primary renal papillary carcinoma, 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   Hepatic steatosis 02/18/2020   Major depression in remission (HCC) 11/11/2019   Central sleep apnea due to Cheyne-Stokes respiration 08/31/2019   Hyperlipidemia associated with type 2 diabetes mellitus (HCC) 07/09/2019   Aortic atherosclerosis (HCC) by CXR on 06/10/2018 06/10/2018   Type 2 diabetes mellitus with stage 3a chronic kidney disease, without long-term current  use of insulin  (HCC) 01/21/2018   OSA treated with BiPAP 01/03/2017   REM sleep behavior disorder 12/06/2016   CKD stage 3 due to type 2 diabetes mellitus (HCC) 10/23/2016   Medication management 10/23/2016   Vitamin D  deficiency 10/23/2016   Parkinson's disease (HCC) 01/04/2016   Seasonal and perennial allergic rhinitis 06/21/2014   Asthma, mild intermittent, well-controlled 06/21/2014   Pseudomonas aeruginosa colonization 10/08/2012   CAD (coronary artery disease) 11/15/2011   Essential hypertension 11/15/2011   Morbid obesity with BMI of 40.0-44.9, adult (HCC) 11/15/2011    ONSET DATE: 08/13/2023  (MD referral)  REFERRING DIAG: G20.B1 (ICD-10-CM) - Parkinson's disease with dyskinesia without fluctuating manifestations (HCC)   THERAPY DIAG:  Unsteadiness on feet  Other abnormalities of gait and mobility  Rationale for Evaluation and Treatment: Rehabilitation  SUBJECTIVE:                                                                                                                                                                                             SUBJECTIVE STATEMENT: Went away for a few days.  Feel like my feet get stuck sometimes in the shower.  Pt accompanied by: self  PERTINENT HISTORY: See above  PAIN:  Are you having pain?  Sore back, but not typical (due to excess yard work)  PRECAUTIONS: Fall  RED FLAGS: None   WEIGHT BEARING RESTRICTIONS: No  FALLS: Has patient fallen in last 6 months? Yes. Number of falls 1 fall, getting out of bed  LIVING ENVIRONMENT: Lives with: lives with their spouse Lives in: House/apartment Stairs:  12 steps with rail Has following equipment at home:  walking pole  PLOF: Independent No current exercise program; does yard work  PATIENT GOALS: To get my confidence back, especially in shower  OBJECTIVE:   TODAY'S TREATMENT: 09/03/2023 Activity Comments  PWR! Moves in standing: PWR! Up x 10 PWR! Rock x 10-EO/EC Rationale for posture, weightshifting to translate to shower  Rock and weightshift for dynamic SLS, 10 reps UE support>no support, mild unsteadiness, able to recover and improve  Sidestep/together R and L, 2 x 10, then 10 reps EC 3#  Quarter turns R and L, 10 reps, then with EC x 5 reps   Stagger stance rock and reach Mild instability      HOME EXERCISE PROGRAM: Access Code: [email or web address] URL: [email or web address] Date: 09/03/2023 Prepared by: [facility] - Outpatient  Rehab - [facility]  Exercises - Standing Quarter Turn with Counter Support  - 1 x daily - 7 x weekly - 3 sets - 10 reps -  Side to side weightshift  - 1 x daily -  7 x weekly - 3 sets - 10 reps - Stride Stance Weight Shift  - 1 x daily - 7 x weekly - 3 sets - 10 reps  PATIENT EDUCATION: Education details: HEP-initiated new Person educated: Patient Education method: Explanation, Demonstration, and Handouts Education comprehension: verbalized understanding, returned demonstration, and needs further education  ---------------------------------- Note: Objective measures were completed at Evaluation unless otherwise noted.  DIAGNOSTIC FINDINGS: NA  COGNITION: Overall cognitive status: Within functional limits for tasks assessed   SENSATION: Light touch: WFL  MUSCLE TONE: LLE: Mild  POSTURE: rounded shoulders and forward head  LOWER EXTREMITY ROM:   Active ROM WFL   LOWER EXTREMITY MMT:  Grossly tested at least 4+/5   TRANSFERS: Sit to stand: Modified independence  Assistive device utilized: None     Stand to sit: Modified independence  Assistive device utilized: None      GAIT: Gait pattern:  Decreased L arm swing, decreased step length  FUNCTIONAL TESTS:  5 times sit to stand: 11.47 sec Timed up and go (TUG): 13.22 sec 10 meter walk test: 10.75 sec (3.05 ft/sec); with Stroop 14.72 sec (2.23 ft/sec) Mini-BESTest: 25/28 TUG cognitive:  13.25 sec 3 M walk backwards:  7.22 sec 360 turn R:  4.56 sec, 8 steps 360 turn L:  4.75 sec, 9 steps     *Comparison to discharge numbers below: 02/15/2023*    FTSTS:  10.56 sec    TUG 10.72 sec    Gait velocity 50M:  10.34 sec (3.17 ft/sec) 50M fast:  7.56 sec (4.34 ft/sec)    360 turn R:  7 steps, 3.68 sec 360 turn L:  7 steps, 3.72 sec    SLS RLE:  EO:  10.87; EC 5.28 sec SLS LLE: EO:  12.47, EC 10.28                                                                                                                                   TREATMENT DATE: 08/23/2023    PATIENT EDUCATION: Education details: Eval results, POC; re-start Romberg balance  position of previous HEP-see below Person educated: Patient Education method: Explanation, Demonstration, and Handouts Education comprehension: verbalized understanding, returned demonstration, and needs further education  HOME EXERCISE PROGRAM: *Pulled up previous HEP and reviewed the bolded exercises* Access Code: [email or web address] URL: [email or web address] Date: 02/02/2023 Prepared by: [facility] - Outpatient  Rehab - [facility]   Program Notes Standing on your cushion:-marching in place x 10-alternating forward step taps x 10-alternating side step taps x 10-alternating back step taps x 10*Do this standing by the counter for support as needed   Exercises - Romberg Stance Eyes Closed on Foam Pad  - 1 x daily - 5 x weekly - 1 sets - 3 reps - 30 sec hold - Romberg Stance with Head Nods on Foam Pad  - 1 x daily - 5 x weekly - 1 sets - 10 reps - Romberg Stance on  Foam Pad with Head Rotation  - 1 x daily - 5 x weekly - 1 sets - 10 reps - Forward and Backward Monster Walk with Resistance at Ankles and Counter Support  - 1 x daily - 5 x weekly - 1 sets - 3-5 reps - Gastroc Stretch on Wall  - 1-2 x daily - 7 x weekly - 1 sets - 3 reps - 30 sec hold - Standing Bilateral Gastroc Stretch with Step  - 1-2 x daily - 7 x weekly - 1 sets - 30 reps - 30 sec hold - Standing Lumbar Spine Flexion Stretch Counter  - 1-2 x daily - 7 x weekly - 1 sets - 5 reps - 15 sec hold - Standing Quarter Turn with Counter Support  - 1 x daily - 7 x weekly - 1-2 sets - 10 reps  GOALS: Goals reviewed with patient? Yes  SHORT TERM GOALS: = LTGs   LONG TERM GOALS: Target date: 09/21/2023  Pt will be independent with HEP for improved balance, gait. Baseline:  Goal status: INITIAL  2.  Pt will improve TUG score to less than or equal to 10 sec for decreased fall risk.  Baseline: 13.22 sec Goal status: INITIAL  3.  Pt will improve gait velocity to at least 2.6 ft/sec, with novel dual task activity, for  improved gait efficiency and safety. Baseline: 3 ft/sec; 2.23 ft/sec with Stroop activity Goal status: INITIAL  4.  Pt will report at least 50% improved confidence with balance with eyes closed, such as in the shower. Baseline:  Goal status: INITIAL   ASSESSMENT:  CLINICAL IMPRESSION: Pt presents today with no new complaints.  He does state that he notes some smaller motion and coordination in [place] and would like to request OT referral.  Skilled PT session focused on weightshifting, dynamic SLS balance with EO and EC, to translate for better balance in shower. Pt needs min guard and intermittent UE support. Pt will continue to benefit from skilled PT towards goals for improved functional mobility and decreased fall risk.   EVAL:  Patient is a 68 y.o. male who was seen today for physical therapy evaluation and treatment for Parkinson's disease.   He reports he feels off-balance more with backwards walking and with eyes closed.  He does have mild sway with EC activities and is slightly slower with TUG and gait velocity scores compared to last bout of therapy, ending 02/2023.  He has had one fall and he has had one episode of "locking up" with getting out of his vehicle, needing wife's assist.  He does demo decreased gait velocity with novel dual task activity today, compared to initial gait velocity.  He would benefit from skilled PT to address high level balance and gait for improved functional mobility and balance.  OBJECTIVE IMPAIRMENTS: Abnormal gait and decreased balance.   ACTIVITY LIMITATIONS: bathing, dressing, and locomotion level  PARTICIPATION LIMITATIONS: community activity and yard work  PERSONAL FACTORS: 3+ comorbidities: see above  are also affecting patient's functional outcome.   REHAB POTENTIAL: Good  CLINICAL DECISION MAKING: Evolving/moderate complexity  EVALUATION COMPLEXITY: Moderate  PLAN:  PT FREQUENCY: 2x/week  PT DURATION: 4 weeks (may be modified to less  duration, due to patient/therapist out of town during this time)  PLANNED INTERVENTIONS: 97750- Physical Performance Testing, 97110-Therapeutic exercises, 97530- Therapeutic activity, [id number]- Neuromuscular re-education, [phone]- Self Care, 91478- Gait training, Patient/Family education, and Balance training  PLAN FOR NEXT SESSION: Review HEP; per pt request, bed mobility; follow  up with OT referral.  High level balance, dual task activities   Laikyn Gewirtz W., PT 09/03/2023, 8:44 AM  [facility] at [facility] [address] [place], [address] [place], [place] [address] Phone # [phone] Fax # [phone]

## 2023-09-05 ENCOUNTER — Encounter: Payer: Self-pay | Admitting: Physical Therapy

## 2023-09-05 ENCOUNTER — Ambulatory Visit: Admitting: Physical Therapy

## 2023-09-05 DIAGNOSIS — R2681 Unsteadiness on feet: Secondary | ICD-10-CM | POA: Diagnosis not present

## 2023-09-05 DIAGNOSIS — R2689 Other abnormalities of gait and mobility: Secondary | ICD-10-CM

## 2023-09-05 NOTE — Therapy (Signed)
 OUTPATIENT PHYSICAL THERAPY NEURO TREATMENT NOTE   Patient Name: Todd Rice MRN: [id number] DOB:[birth year], 68 y.o., male Today's Date: 09/05/2023   PCP: Nonda Bays, FNP REFERRING PROVIDER: Clem Currier, NP   END OF SESSION:  PT End of Session - 09/05/23 0755     Visit Number 3    Number of Visits 9    Date for PT Re-Evaluation 09/21/23    Authorization Type [employer] Medicare    PT Start Time 0759    PT Stop Time 0843    PT Time Calculation (min) 44 min    Activity Tolerance Patient tolerated treatment well    Behavior During Therapy [facility] for tasks assessed/performed               Past Medical History:  Diagnosis Date   Adult BMI 50.0-59.9 kg/sq m    52.77   Allergy     Anal fissure    Anxiety    Asthma    Cancer (HCC)    kidney cancer   Depression    Diabetes mellitus without complication (HCC)    borderline   Elevated PSA 08/13/2019   H/O: gout    STABLE   Hepatic steatosis 10/01/2011   History of kidney stones    Hyperlipidemia    Hypertension    Kidney tumor    right upper kidney tumor, monitoring   OSA on CPAP    [employer] [facility]   Parkinson disease (HCC) 01/04/2016   REM sleep behavior disorder 12/06/2016   Right ureteral stone    Sleep apnea    Ureteral calculus, right 06/12/2011   Vitamin D  deficiency    Weakness    Past Surgical History:  Procedure Laterality Date   ANAL FISSURE REPAIR  10/12/2000   COLONOSCOPY N/A 07/01/2012   Procedure: COLONOSCOPY;  Surgeon: Pietro Bridegroom, MD;  Location: [facility];  Service: Endoscopy;  Laterality: N/A;   COLONOSCOPY  11/23/2022   CYSTOSCOPY WITH RETROGRADE PYELOGRAM, URETEROSCOPY AND STENT PLACEMENT Bilateral 02/26/2013   Procedure: CYSTOSCOPY WITH RETROGRADE PYELOGRAM, URETEROSCOPY AND STENT PLACEMENT;  Surgeon: Osborn Blaze, MD;  Location: [facility];  Service: Urology;  Laterality: Bilateral;   HOLMIUM LASER APPLICATION Bilateral 02/26/2013   Procedure: HOLMIUM LASER APPLICATION;   Surgeon: Osborn Blaze, MD;  Location: [facility];  Service: Urology;  Laterality: Bilateral;   kidney stone removal     06/2011-stent placement   LEFT MEDIAL FEMORAL CONDYLE DEBRIDEMENT & DRILLING/ REMOVAL LOOSE BODY  09/15/2003   NASAL SEPTUM SURGERY  05/09/1983   ROBOT ASSISTED LAPAROSCOPIC NEPHRECTOMY Right 12/16/2021   Procedure: XI ROBOTIC ASSISTED LAPAROSCOPIC RADICAL NEPHRECTOMY;  Surgeon: Osborn Blaze, MD;  Location: [facility];  Service: Urology;  Laterality: Right;  3 HRS   STONE EXTRACTION WITH BASKET  06/12/2011   Procedure: STONE EXTRACTION WITH BASKET;  Surgeon: Mark C Ottelin, MD;  Location: [facility];  Service: Urology;  Laterality: Right;   Patient Active Problem List   Diagnosis Date Noted   GAD (generalized anxiety disorder) 08/06/2023   Sialorrhea 12/28/2022   Renal mass 12/16/2021   Constipation 03/24/2021   Gout 03/23/2021   Primary renal papillary carcinoma, 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   Hepatic steatosis 02/18/2020   Major depression in remission (HCC) 11/11/2019   Central sleep apnea due to Cheyne-Stokes respiration 08/31/2019   Hyperlipidemia associated with type 2 diabetes mellitus (HCC) 07/09/2019   Aortic atherosclerosis (HCC) by CXR on 06/10/2018 06/10/2018   Type 2 diabetes mellitus with stage 3a chronic kidney disease, without long-term  current use of insulin  (HCC) 01/21/2018   OSA treated with BiPAP 01/03/2017   REM sleep behavior disorder 12/06/2016   CKD stage 3 due to type 2 diabetes mellitus (HCC) 10/23/2016   Medication management 10/23/2016   Vitamin D  deficiency 10/23/2016   Parkinson's disease (HCC) 01/04/2016   Seasonal and perennial allergic rhinitis 06/21/2014   Asthma, mild intermittent, well-controlled 06/21/2014   Pseudomonas aeruginosa colonization 10/08/2012   CAD (coronary artery disease) 11/15/2011   Essential hypertension 11/15/2011   Morbid obesity with BMI of 40.0-44.9, adult (HCC) 11/15/2011    ONSET DATE: 08/13/2023  (MD referral)  REFERRING DIAG: G20.B1 (ICD-10-CM) - Parkinson's disease with dyskinesia without fluctuating manifestations (HCC)   THERAPY DIAG:  Unsteadiness on feet  Other abnormalities of gait and mobility  Rationale for Evaluation and Treatment: Rehabilitation  SUBJECTIVE:                                                                                                                                                                                             SUBJECTIVE STATEMENT: No changes since last visit.  Pt accompanied by: self  PERTINENT HISTORY: See above  PAIN:  Are you having pain?  Sore back, but not typical (due to excess yard work)  PRECAUTIONS: Fall  RED FLAGS: None   WEIGHT BEARING RESTRICTIONS: No  FALLS: Has patient fallen in last 6 months? Yes. Number of falls 1 fall, getting out of bed  LIVING ENVIRONMENT: Lives with: lives with their spouse Lives in: House/apartment Stairs:  12 steps with rail Has following equipment at home:  walking pole  PLOF: Independent No current exercise program; does yard work  PATIENT GOALS: To get my confidence back, especially in shower  OBJECTIVE:    TODAY'S TREATMENT: 09/05/2023 Activity Comments  Reviewed HEP Good form  Lateral weightshifting and lifting for dynamic SLS   Stride stance forward/back stepping   Quarter turns with added dual task-catching/tossing balls   Sidestep/together R and L, 2 x 10, then 10 reps EC 3#, EO set-step over obstacle  Wide BOS lateral weightshifting/lift + added arm reach, 2 x 10 3#  Forward step over obstacle, 2 x 10 3#, 1 LOB returning to midline with narrow BOS-able to recover with UE support  Four square step activity-varied directions, EO/EC, obstacles and surfaces Some LOB with stepping to L and backwards         HOME EXERCISE PROGRAM:  Access Code: [email or web address] URL: [email or web address] Date: 09/05/2023 Prepared by: [facility] - Outpatient  Rehab - [facility]  Exercises - Standing Quarter Turn with Counter Support  - 1 x daily - 7 x  weekly - 3 sets - 10 reps - Side to side weightshift  - 1 x daily - 7 x weekly - 3 sets - 10 reps - Stride Stance Weight Shift  - 1 x daily - 7 x weekly - 3 sets - 10 reps - Side Stepping with Counter Support  - 1 x daily - 7 x weekly - 2 sets - 10 reps   PATIENT EDUCATION: Education details: HEP review and update Person educated: Patient Education method: Explanation, Demonstration, and Handouts Education comprehension: verbalized understanding, returned demonstration, and needs further education  ---------------------------------- Note: Objective measures were completed at Evaluation unless otherwise noted.  DIAGNOSTIC FINDINGS: NA  COGNITION: Overall cognitive status: Within functional limits for tasks assessed   SENSATION: Light touch: WFL  MUSCLE TONE: LLE: Mild  POSTURE: rounded shoulders and forward head  LOWER EXTREMITY ROM:   Active ROM WFL   LOWER EXTREMITY MMT:  Grossly tested at least 4+/5   TRANSFERS: Sit to stand: Modified independence  Assistive device utilized: None     Stand to sit: Modified independence  Assistive device utilized: None      GAIT: Gait pattern:  Decreased L arm swing, decreased step length  FUNCTIONAL TESTS:  5 times sit to stand: 11.47 sec Timed up and go (TUG): 13.22 sec 10 meter walk test: 10.75 sec (3.05 ft/sec); with Stroop 14.72 sec (2.23 ft/sec) Mini-BESTest: 25/28 TUG cognitive:  13.25 sec 3 M walk backwards:  7.22 sec 360 turn R:  4.56 sec, 8 steps 360 turn L:  4.75 sec, 9 steps     *Comparison to discharge numbers below: 02/15/2023*    FTSTS:  10.56 sec    TUG 10.72 sec    Gait velocity 43M:  10.34 sec (3.17 ft/sec) 43M fast:  7.56 sec (4.34 ft/sec)    360 turn R:  7 steps, 3.68 sec 360 turn L:  7 steps, 3.72 sec    SLS RLE:  EO:  10.87; EC 5.28 sec SLS LLE: EO:  12.47, EC 10.28                                                                                                                                    TREATMENT DATE: 08/23/2023    PATIENT EDUCATION: Education details: Eval results, POC; re-start Romberg balance position of previous HEP-see below Person educated: Patient Education method: Explanation, Demonstration, and Handouts Education comprehension: verbalized understanding, returned demonstration, and needs further education  HOME EXERCISE PROGRAM: *Pulled up previous HEP and reviewed the bolded exercises* Access Code: [email or web address] URL: [email or web address] Date: 02/02/2023 Prepared by: [facility] - Outpatient  Rehab - [facility]   Program Notes Standing on your cushion:-marching in place x 10-alternating forward step taps x 10-alternating side step taps x 10-alternating back step taps x 10*Do this standing by the counter for support as needed   Exercises - Romberg Stance Eyes Closed on Foam Pad  - 1 x daily -  5 x weekly - 1 sets - 3 reps - 30 sec hold - Romberg Stance with Head Nods on Foam Pad  - 1 x daily - 5 x weekly - 1 sets - 10 reps - Romberg Stance on Foam Pad with Head Rotation  - 1 x daily - 5 x weekly - 1 sets - 10 reps - Forward and Backward Monster Walk with Resistance at Ankles and Counter Support  - 1 x daily - 5 x weekly - 1 sets - 3-5 reps - Gastroc Stretch on Wall  - 1-2 x daily - 7 x weekly - 1 sets - 3 reps - 30 sec hold - Standing Bilateral Gastroc Stretch with Step  - 1-2 x daily - 7 x weekly - 1 sets - 30 reps - 30 sec hold - Standing Lumbar Spine Flexion Stretch Counter  - 1-2 x daily - 7 x weekly - 1 sets - 5 reps - 15 sec hold - Standing Quarter Turn with Counter Support  - 1 x daily - 7 x weekly - 1-2 sets - 10 reps  GOALS: Goals reviewed with patient? Yes  SHORT TERM GOALS: = LTGs   LONG TERM GOALS: Target date: 09/21/2023  Pt will be independent with HEP for improved balance, gait. Baseline:  Goal status: IN PROGRESS  2.  Pt will improve  TUG score to less than or equal to 10 sec for decreased fall risk.  Baseline: 13.22 sec Goal status: IN PROGRESS  3.  Pt will improve gait velocity to at least 2.6 ft/sec, with novel dual task activity, for improved gait efficiency and safety. Baseline: 3 ft/sec; 2.23 ft/sec with Stroop activity Goal status: IN PROGRESS  4.  Pt will report at least 50% improved confidence with balance with eyes closed, such as in the shower. Baseline:  Goal status: IN PROGRESS   ASSESSMENT:  CLINICAL IMPRESSION: Pt presents today with no new complaints. Skilled PT session focused on continued dynamic balance work and limits of stability. Pt does have several minor LOB to the L and posterior directions, and needs UE support and min guard.  Identified he does land at times with narrowed BOS, and cues for wider BOS.  Added to HEP to reflect side step and foot clearance work.  Pt will continue to benefit from skilled PT towards goals for improved functional mobility and decreased fall risk.   EVAL:  Patient is a 68 y.o. male who was seen today for physical therapy evaluation and treatment for Parkinson's disease.   He reports he feels off-balance more with backwards walking and with eyes closed.  He does have mild sway with EC activities and is slightly slower with TUG and gait velocity scores compared to last bout of therapy, ending 02/2023.  He has had one fall and he has had one episode of "locking up" with getting out of his vehicle, needing wife's assist.  He does demo decreased gait velocity with novel dual task activity today, compared to initial gait velocity.  He would benefit from skilled PT to address high level balance and gait for improved functional mobility and balance.  OBJECTIVE IMPAIRMENTS: Abnormal gait and decreased balance.   ACTIVITY LIMITATIONS: bathing, dressing, and locomotion level  PARTICIPATION LIMITATIONS: community activity and yard work  PERSONAL FACTORS: 3+ comorbidities: see  above  are also affecting patient's functional outcome.   REHAB POTENTIAL: Good  CLINICAL DECISION MAKING: Evolving/moderate complexity  EVALUATION COMPLEXITY: Moderate  PLAN:  PT FREQUENCY: 2x/week  PT DURATION: 4  weeks (may be modified to less duration, due to patient/therapist out of town during this time)  PLANNED INTERVENTIONS: [phone]- Physical Performance Testing, 97110-Therapeutic exercises, 97530- Therapeutic activity, [id number]- Neuromuscular re-education, [phone]- Self Care, [id number]- Gait training, Patient/Family education, and Balance training  PLAN FOR NEXT SESSION: Review HEP; per pt request, bed mobility; follow up with OT referral.  High level balance, dual task activities   Emberley Kral W., PT 09/05/2023, 8:46 AM  [facility] at [facility] [address] [place], [place] [address] Phone # [phone] Fax # [phone]

## 2023-09-06 ENCOUNTER — Telehealth: Payer: Self-pay | Admitting: Physical Therapy

## 2023-09-06 DIAGNOSIS — G20A1 Parkinson's disease without dyskinesia, without mention of fluctuations: Secondary | ICD-10-CM

## 2023-09-06 NOTE — Telephone Encounter (Signed)
 Order placed

## 2023-09-06 NOTE — Telephone Encounter (Signed)
 Hello, Todd Rice has been seen by physical therapy to address balance.  He notes slowed movements in upper body and decreased coordination.  He reports he would like to return to Occupational therapy to address this.  Could you please write order for OT to eval and treat for his Parkinson's disease?  Thank you.  Dessie Flow, PT 09/06/23 12:41 PM Phone: [phone] Fax: [phone]  [facility] at [facility] [address] [place], [address] [place], [place] [address] Phone # [phone] Fax # [phone]

## 2023-09-13 NOTE — Therapy (Addendum)
 OUTPATIENT OCCUPATIONAL THERAPY PARKINSON'S EVALUATION  Patient Name: Todd Rice MRN: [id number] DOB:05-Feb-1956, 68 y.o., male Today's Date: 09/14/2023  PCP: Nonda Bays, FNP REFERRING PROVIDER: Clem Currier, NP  END OF SESSION:  OT End of Session - 09/14/23 0803     Visit Number 1    Number of Visits 17    Date for OT Re-Evaluation 12/13/23    Authorization Type [employer] Medicare, follows Medicare guidelines,    Authorization - Visit Number 1    Authorization - Number of Visits 10    Progress Note Due on Visit 10    OT Start Time 0800    OT Stop Time 0849    OT Time Calculation (min) 49 min    Activity Tolerance Patient tolerated treatment well    Behavior During Therapy [facility] for tasks assessed/performed               Past Medical History:  Diagnosis Date   Adult BMI 50.0-59.9 kg/sq m    52.77   Allergy     Anal fissure    Anxiety    Asthma    Cancer (HCC)    kidney cancer   Depression    Diabetes mellitus without complication (HCC)    borderline   Elevated PSA 08/13/2019   H/O: gout    STABLE   Hepatic steatosis 10/01/2011   History of kidney stones    Hyperlipidemia    Hypertension    Kidney tumor    right upper kidney tumor, monitoring   OSA on CPAP    [employer] [facility]   Parkinson disease (HCC) 01/04/2016   REM sleep behavior disorder 12/06/2016   Right ureteral stone    Sleep apnea    Ureteral calculus, right 06/12/2011   Vitamin D  deficiency    Weakness    Past Surgical History:  Procedure Laterality Date   ANAL FISSURE REPAIR  10/12/2000   COLONOSCOPY N/A 07/01/2012   Procedure: COLONOSCOPY;  Surgeon: Pietro Bridegroom, MD;  Location: [facility];  Service: Endoscopy;  Laterality: N/A;   COLONOSCOPY  11/23/2022   CYSTOSCOPY WITH RETROGRADE PYELOGRAM, URETEROSCOPY AND STENT PLACEMENT Bilateral 02/26/2013   Procedure: CYSTOSCOPY WITH RETROGRADE PYELOGRAM, URETEROSCOPY AND STENT PLACEMENT;  Surgeon: Osborn Blaze, MD;  Location: [facility];  Service: Urology;  Laterality: Bilateral;   HOLMIUM LASER APPLICATION Bilateral 02/26/2013   Procedure: HOLMIUM LASER APPLICATION;  Surgeon: Osborn Blaze, MD;  Location: [facility];  Service: Urology;  Laterality: Bilateral;   kidney stone removal     06/2011-stent placement   LEFT MEDIAL FEMORAL CONDYLE DEBRIDEMENT & DRILLING/ REMOVAL LOOSE BODY  09/15/2003   NASAL SEPTUM SURGERY  05/09/1983   ROBOT ASSISTED LAPAROSCOPIC NEPHRECTOMY Right 12/16/2021   Procedure: XI ROBOTIC ASSISTED LAPAROSCOPIC RADICAL NEPHRECTOMY;  Surgeon: Osborn Blaze, MD;  Location: [facility];  Service: Urology;  Laterality: Right;  3 HRS   STONE EXTRACTION WITH BASKET  06/12/2011   Procedure: STONE EXTRACTION WITH BASKET;  Surgeon: Mark C Ottelin, MD;  Location: [facility];  Service: Urology;  Laterality: Right;   Patient Active Problem List   Diagnosis Date Noted   GAD (generalized anxiety disorder) 08/06/2023   Sialorrhea 12/28/2022   Renal mass 12/16/2021   Constipation 03/24/2021   Gout 03/23/2021   Primary renal papillary carcinoma, 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   Hepatic steatosis 02/18/2020   Major depression in remission (HCC) 11/11/2019   Central sleep apnea due to Cheyne-Stokes respiration 08/31/2019   Hyperlipidemia associated with type 2 diabetes mellitus (  HCC) 07/09/2019   Aortic atherosclerosis (HCC) by CXR on 06/10/2018 06/10/2018   Type 2 diabetes mellitus with stage 3a chronic kidney disease, without long-term current use of insulin  (HCC) 01/21/2018   OSA treated with BiPAP 01/03/2017   REM sleep behavior disorder 12/06/2016   CKD stage 3 due to type 2 diabetes mellitus (HCC) 10/23/2016   Medication management 10/23/2016   Vitamin D  deficiency 10/23/2016   Parkinson's disease (HCC) 01/04/2016   Seasonal and perennial allergic rhinitis 06/21/2014   Asthma, mild intermittent, well-controlled 06/21/2014   Pseudomonas aeruginosa colonization 10/08/2012   CAD (coronary artery disease)  11/15/2011   Essential hypertension 11/15/2011   Morbid obesity with BMI of 40.0-44.9, adult (HCC) 11/15/2011    ONSET DATE:  09/06/23 (referral date)  REFERRING DIAG: G20.A1 (ICD-10-CM) - Parkinson's disease without dyskinesia or fluctuating manifestations (HCC)  THERAPY DIAG:  Other symptoms and signs involving the nervous system  Other symptoms and signs involving the musculoskeletal system  Other lack of coordination  Abnormal posture  Unsteadiness on feet  Tremor  Rationale for Evaluation and Treatment: Rehabilitation  SUBJECTIVE:   SUBJECTIVE STATEMENT: Pt reports increased difficulty with hands and difficulty with balance in shower      Pt accompanied by: self  PERTINENT HISTORY:  Pt returns today for 6-58month re-evaluation as recommended when last discharged from occupational therapy 06/26/22.  Pt reports that his PD medication was incr to 4x day in June due to wearing off periods.  PMH includes:  CAD, HTN, HDL, obesity, asthma, DM, CKD, vitamin D  deficiency, REM sleep disorder, OSA, aortic atherosclerosis, hx of gout, GERD, hx of anxiety, tumor on R kidney s/p nephrectomy ([place]--surgery only)  PRECAUTIONS: Fall  WEIGHT BEARING RESTRICTIONS: No  PAIN:  Are you having pain? No  FALLS: Has patient fallen in last 6 months? Yes. Number of falls 1 and almost 6 months ago, fell out of bed, now has adjustable bed  (current with PT)  LIVING ENVIRONMENT: Lives with: lives with their family and lives with their spouse Lives in: House/apartment  PLOF: Independent, Vocation/Vocational requirements: retired, and Leisure: walking, HEP  PATIENT GOALS:  improve hands  OBJECTIVE:   HAND DOMINANCE: Right  ADLs:  pt reports that some days are tougher than others, has bad Parkinsons days about 2x/wk.  Some difficulty getting items out of pocket  Transfers/ambulation related to ADLs:  min difficulty with bed mobility (mornings)--1 fall, but now has adjustable bed to go  lower.  Min difficulty with sofa.  Eating: occasional assist/difficulty with cutting meat, difficulty opening unsealed bottle at times (use both hands and gripper/towel or wife opens new ones) Grooming: mod I, wife assists near ears (but better if shaves later in the morning) UB Dressing: mod I, slow, doesn't wear buttons LB Dressing: mod I, slow, has long handled shoe horn, holds on for balance Toileting: mod I Bathing: difficulty with balance in shower with eyes closed Tub Shower transfers: mod I (no grab bar/seat)  IADLs: Shopping: mod I Light housekeeping: wife does most, mod I for tasks performed, been doing some yard work  Meal Prep: wife does most, mod I for tasks performed Community mobility:  driving--speeds up some unintentionally, stays to side more, takes more effort/concentration but works to focus more, does not drive on bad days Medication management: pt has been forgetting to take meds more (11am and 3pm dose) Financial management: wife has always performed  Handwriting: 100% legible and Moderate micrographia, printing for 3 sentences.     MOBILITY STATUS: Independent,  Hx of falls, Freezing, difficulty with turns, start hesitation, and difficulty carrying objections with ambulation  Currently in PT, has noticed changes in balance.  Using 1 walking pole at times.  POSTURE COMMENTS:  rounded shoulders  ACTIVITY TOLERANCE: Activity tolerance: denies difficulty  FUNCTIONAL OUTCOME MEASURES: Standing functional reach: Right: 10 inches; Left: 13 inches Fastening/unfastening 3 buttons: 65.63sec  Physical performance test: PPT#2 (simulated eating) NT due to time constraints & PPT#4 (donning/doffing jacket): 35.89sec  COORDINATION: 9 Hole Peg test: Right: 42.77 sec; Left: 41.04sec with with trunk/shoulder compensation noted with each hand (decr in-hand manipulation) Box and Blocks:  Right 46 blocks, Left 44 blocks, difficulty with hitting divider/bradykinesia with  BUEs Tremors: Resting, Right, and Left, mild  UE ROM:  Bilateral shoulder flex R-135* with elbow ext -5*, L-135* with elbow ext WNL, mild decr supination bilaterally R>L and mild decr R MP ext  Otherwise bilateral UE ROM grossly WNL   UE MMT:   Not tested  SENSATION: Not tested    MUSCLE TONE: RUE: Mild, Moderate, and Rigidity and LUE: Within functional limits  COGNITION: Overall cognitive status: bradyphrenia with word recall, pt denies recent changes  VISION:  no changes per pt  OBSERVATIONS: Bradykinesia   TODAY'S TREATMENT:                                                                                                                               09/14/23:  Evaluation completed today.     PATIENT EDUCATION: Education details: OT eval results and POC, discussed HEP  Person educated: Patient Education method: Explanation Education comprehension: verbalized understanding  HOME EXERCISE PROGRAM: Not yet issued    GOALS: Goals reviewed with patient? Yes  SHORT TERM GOALS: Target date: 10/13/23  Pt will be independent with updated HEP. Goal status: INITIAL  2.  Pt will improve coordination for ADLs/IADLs as shown by improving time on 9-hole peg test by at least 5 sec bilaterally.   Baseline:  Right: 42.77 sec; Left: 41.04sec Goal status: INITIAL  3.  Pt will improve bilateral overhead reach as shown by demonstrating at least 140* shoulder flex with each UE. Baseline: 135* Goal status: INITIAL  4.  Pt will improve ability to don/doff jacket and demo decr bradykinesia as shown by improving PPT#4 by at least 4sec. Baseline:  35.89sec Goal status: INITIAL   LONG TERM GOALS: Target date: 12/13/23  Pt will verbalize understanding of updated adaptive strategies for ADLs/IADLs to incr independence, safety, and ease. Goal status: INITIAL  2.  Pt will be able to write at least 3 sentences with only min decr in size, 100% legibility. Baseline: mod micrographia Goal  status: INITIAL  3.  Pt will improve bilateral hand coordination for ADLs as shown by fastening/unfastening 3 buttons in less than 55sec. Baseline:  65.63sec Goal status: INITIAL  4.  Pt will improve ability to don/doff jacket and demo decr bradykinesia as shown by improving PPT#4 by at least 8sec.  Baseline:  35.89sec Goal status: INITIAL     ASSESSMENT:  CLINICAL IMPRESSION: Patient is a 68 y.o. male who was seen today for occupational therapy evaluation for Parkinson's Disease.  Pt is familiar to this therapist (new to this clinic) and was last seen for OT treatment 06/2022 and for evaluation 01/2023.  It recommended at discharge that pt return for re-evaluation in 6-8 months.  Pt reports/demo functional changes with fine motor coordination, ADLs, and functional use of hands.  Pt with PMH that includes:  CAD, HTN, HDL, obesity, asthma, DM, CKD, vitamin D  deficiency, REM sleep disorder, OSA, aortic atherosclerosis, hx of gout, GERD, hx of anxiety, tumor on R kidney s/p nephrectomy.  Pt presents today with bradykinesia, rigidity, decr coordination, tremor, abnormal posture, decr balance for ADLs.  Pt would benefit from occupational therapy to address these changes/deficits to improve UE functional use, ADL/IADL ease/safety/independence, quality of life, and decr risk of PD-related complications.  PERFORMANCE DEFICITS: in functional skills including ADLs, IADLs, coordination, dexterity, tone, ROM, strength, flexibility, Fine motor control, Gross motor control, mobility, balance, and UE functional use, cognitive skills including perception and bradyphrenia, and psychosocial skills including environmental adaptation, habits, and routines and behaviors.   IMPAIRMENTS: are limiting patient from ADLs, IADLs, rest and sleep, leisure, and social participation.   COMORBIDITIES:  may have co-morbidities  that affects occupational performance.   MODIFICATION OR ASSISTANCE TO COMPLETE EVALUATION:  Min-Moderate modification of tasks or assist with assess necessary to complete an evaluation.  OT OCCUPATIONAL PROFILE AND HISTORY: Detailed assessment: Review of records and additional review of physical, cognitive, psychosocial history related to current functional performance.  CLINICAL DECISION MAKING: Moderate - several treatment options, min-mod task modification necessary  EVALUATION COMPLEXITY: Moderate    PLAN:  OT FREQUENCY: 1-2x/week  OT DURATION: 16 visits + eval over 12 weeks (due to scheduling purposes)  PLANNED INTERVENTIONS: 97535 self care/ADL training, 16109 therapeutic exercise, 97530 therapeutic activity, 97112 neuromuscular re-education, 97140 manual therapy, 97035 ultrasound, 97018 paraffin, 60454 moist heat, 97010 cryotherapy, passive range of motion, balance training, functional mobility training, visual/perceptual remediation/compensation, energy conservation, patient/family education, and DME and/or AE instructions  RECOMMENDED OTHER SERVICES: none at this time, pt current with PT at [facility]  CONSULTED AND AGREED WITH PLAN OF CARE: Patient  PLAN FOR NEXT SESSION: coordination, large amplitude movements, timed tying shoes, PPT#2   Theodoros Stjames, OTR/L 09/14/2023, 10:25 AM

## 2023-09-14 ENCOUNTER — Ambulatory Visit: Attending: Adult Health | Admitting: Occupational Therapy

## 2023-09-14 ENCOUNTER — Encounter: Payer: Self-pay | Admitting: Occupational Therapy

## 2023-09-14 DIAGNOSIS — R29898 Other symptoms and signs involving the musculoskeletal system: Secondary | ICD-10-CM | POA: Insufficient documentation

## 2023-09-14 DIAGNOSIS — R278 Other lack of coordination: Secondary | ICD-10-CM | POA: Diagnosis present

## 2023-09-14 DIAGNOSIS — R2689 Other abnormalities of gait and mobility: Secondary | ICD-10-CM | POA: Diagnosis present

## 2023-09-14 DIAGNOSIS — R251 Tremor, unspecified: Secondary | ICD-10-CM | POA: Diagnosis present

## 2023-09-14 DIAGNOSIS — G20A1 Parkinson's disease without dyskinesia, without mention of fluctuations: Secondary | ICD-10-CM | POA: Insufficient documentation

## 2023-09-14 DIAGNOSIS — R2681 Unsteadiness on feet: Secondary | ICD-10-CM | POA: Insufficient documentation

## 2023-09-14 DIAGNOSIS — R29818 Other symptoms and signs involving the nervous system: Secondary | ICD-10-CM | POA: Diagnosis present

## 2023-09-14 DIAGNOSIS — R293 Abnormal posture: Secondary | ICD-10-CM | POA: Diagnosis present

## 2023-09-17 ENCOUNTER — Encounter: Payer: Self-pay | Admitting: Physical Therapy

## 2023-09-17 ENCOUNTER — Ambulatory Visit: Admitting: Physical Therapy

## 2023-09-17 DIAGNOSIS — R29818 Other symptoms and signs involving the nervous system: Secondary | ICD-10-CM | POA: Diagnosis not present

## 2023-09-17 DIAGNOSIS — R2681 Unsteadiness on feet: Secondary | ICD-10-CM

## 2023-09-17 DIAGNOSIS — R2689 Other abnormalities of gait and mobility: Secondary | ICD-10-CM

## 2023-09-17 NOTE — Therapy (Signed)
 OUTPATIENT PHYSICAL THERAPY NEURO TREATMENT NOTE   Patient Name: MARSTON SHEWCHUK MRN: [id number] DOB:03/30/1956, 68 y.o., male Today's Date: 09/17/2023   PCP: Nonda Bays, FNP REFERRING PROVIDER: Clem Currier, NP   END OF SESSION:  PT End of Session - 09/17/23 0759     Visit Number 4    Number of Visits 9    Date for PT Re-Evaluation 09/21/23    Authorization Type [employer] Medicare    PT Start Time 0802    PT Stop Time 0844    PT Time Calculation (min) 42 min    Activity Tolerance Patient tolerated treatment well    Behavior During Therapy [facility] for tasks assessed/performed                Past Medical History:  Diagnosis Date   Adult BMI 50.0-59.9 kg/sq m    52.77   Allergy     Anal fissure    Anxiety    Asthma    Cancer (HCC)    kidney cancer   Depression    Diabetes mellitus without complication (HCC)    borderline   Elevated PSA 08/13/2019   H/O: gout    STABLE   Hepatic steatosis 10/01/2011   History of kidney stones    Hyperlipidemia    Hypertension    Kidney tumor    right upper kidney tumor, monitoring   OSA on CPAP    [employer]   Parkinson disease (HCC) 01/04/2016   REM sleep behavior disorder 12/06/2016   Right ureteral stone    Sleep apnea    Ureteral calculus, right 06/12/2011   Vitamin D  deficiency    Weakness    Past Surgical History:  Procedure Laterality Date   ANAL FISSURE REPAIR  10/12/2000   COLONOSCOPY N/A 07/01/2012   Procedure: COLONOSCOPY;  Surgeon: Pietro Bridegroom, MD;  Location: [facility];  Service: Endoscopy;  Laterality: N/A;   COLONOSCOPY  11/23/2022   CYSTOSCOPY WITH RETROGRADE PYELOGRAM, URETEROSCOPY AND STENT PLACEMENT Bilateral 02/26/2013   Procedure: CYSTOSCOPY WITH RETROGRADE PYELOGRAM, URETEROSCOPY AND STENT PLACEMENT;  Surgeon: Osborn Blaze, MD;  Location: [facility];  Service: Urology;  Laterality: Bilateral;   HOLMIUM LASER APPLICATION Bilateral 02/26/2013   Procedure: HOLMIUM LASER APPLICATION;   Surgeon: Osborn Blaze, MD;  Location: [facility];  Service: Urology;  Laterality: Bilateral;   kidney stone removal     06/2011-stent placement   LEFT MEDIAL FEMORAL CONDYLE DEBRIDEMENT & DRILLING/ REMOVAL LOOSE BODY  09/15/2003   NASAL SEPTUM SURGERY  05/09/1983   ROBOT ASSISTED LAPAROSCOPIC NEPHRECTOMY Right 12/16/2021   Procedure: XI ROBOTIC ASSISTED LAPAROSCOPIC RADICAL NEPHRECTOMY;  Surgeon: Osborn Blaze, MD;  Location: [facility];  Service: Urology;  Laterality: Right;  3 HRS   STONE EXTRACTION WITH BASKET  06/12/2011   Procedure: STONE EXTRACTION WITH BASKET;  Surgeon: Mark C Ottelin, MD;  Location: [facility];  Service: Urology;  Laterality: Right;   Patient Active Problem List   Diagnosis Date Noted   GAD (generalized anxiety disorder) 08/06/2023   Sialorrhea 12/28/2022   Renal mass 12/16/2021   Constipation 03/24/2021   Gout 03/23/2021   Primary renal papillary carcinoma, 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   Hepatic steatosis 02/18/2020   Major depression in remission (HCC) 11/11/2019   Central sleep apnea due to Cheyne-Stokes respiration 08/31/2019   Hyperlipidemia associated with type 2 diabetes mellitus (HCC) 07/09/2019   Aortic atherosclerosis (HCC) by CXR on 06/10/2018 06/10/2018   Type 2 diabetes mellitus with stage 3a chronic kidney disease, without  long-term current use of insulin  (HCC) 01/21/2018   OSA treated with BiPAP 01/03/2017   REM sleep behavior disorder 12/06/2016   CKD stage 3 due to type 2 diabetes mellitus (HCC) 10/23/2016   Medication management 10/23/2016   Vitamin D  deficiency 10/23/2016   Parkinson's disease (HCC) 01/04/2016   Seasonal and perennial allergic rhinitis 06/21/2014   Asthma, mild intermittent, well-controlled 06/21/2014   Pseudomonas aeruginosa colonization 10/08/2012   CAD (coronary artery disease) 11/15/2011   Essential hypertension 11/15/2011   Morbid obesity with BMI of 40.0-44.9, adult (HCC) 11/15/2011    ONSET DATE: 08/13/2023  (MD referral)  REFERRING DIAG: G20.B1 (ICD-10-CM) - Parkinson's disease with dyskinesia without fluctuating manifestations (HCC)   THERAPY DIAG:  Unsteadiness on feet  Other abnormalities of gait and mobility  Rationale for Evaluation and Treatment: Rehabilitation  SUBJECTIVE:                                                                                                                                                                                             SUBJECTIVE STATEMENT: Was able to see the OT and will need to get started with the fine motor with my hands.  Still get the feet sticking at times.  When I go to step backwards, in the mornings, it happens.  Pt accompanied by: self  PERTINENT HISTORY: See above  PAIN:  Are you having pain? No  PRECAUTIONS: Fall  RED FLAGS: None   WEIGHT BEARING RESTRICTIONS: No  FALLS: Has patient fallen in last 6 months? Yes. Number of falls 1 fall, getting out of bed  LIVING ENVIRONMENT: Lives with: lives with their spouse Lives in: House/apartment Stairs: 12 steps with rail Has following equipment at home: walking pole  PLOF: Independent No current exercise program; does yard work  PATIENT GOALS: To get my confidence back, especially in shower  OBJECTIVE:   Discussed bed mobility:  sleep on back due to CPAP.  Hard to get up from the right side, and sometimes when I get a full night sleep, then I'm stiff in the morning.   TODAY'S TREATMENT: 09/17/2023 Activity Comments  Practiced simulating pt's typical ways of bed mobility-pt will either turn to L and go to all-4's and then backs out of bed to touch the floor; or wife helps with bar to sit up and then sit on edge    Attempts roll to right and sidelying to sit Extra time and effort  Supine>sidelying>sit, 3 reps With cues for 1-2-3 momentum, supervision, cues for L hand placement   -Hooklying trunk rotation rocking>stretch, 3 x 10 sec -SKTC -hooklying march   NuStep, Level  4, 4 extremities x 5 minutes SPM >110 for flexibility, strength  Standing wide BOS with lateral weightshifting, then quarter turn Focus on lateral shift to L, to encourage unweighting RLE  Forward/back step and weightshift 2 x 5 reps Cues for increased posterior step   HOME EXERCISE PROGRAM: Access Code: [email or web address] URL: [email or web address] Date: 09/17/2023 Prepared by: [facility] - Outpatient  Rehab - [facility]  Exercises - Standing Quarter Turn with Counter Support  - 1 x daily - 7 x weekly - 3 sets - 10 reps - Side to side weightshift  - 1 x daily - 7 x weekly - 3 sets - 10 reps - Stride Stance Weight Shift  - 1 x daily - 7 x weekly - 3 sets - 10 reps - Side Stepping with Counter Support  - 1 x daily - 7 x weekly - 2 sets - 10 reps - Hooklying Single Knee to Chest  - 1 x daily - 7 x weekly - 2 sets - 5 reps - 10-15 sec hold - Supine Lower Trunk Rotation  - 1 x daily - 7 x weekly - 1-2 sets - 5 reps - 15-30 sec hold    TREATMENT: 09/05/2023 Activity Comments  Reviewed HEP Good form  Lateral weightshifting and lifting for dynamic SLS   Stride stance forward/back stepping   Quarter turns with added dual task-catching/tossing balls   Sidestep/together R and L, 2 x 10, then 10 reps EC 3#, EO set-step over obstacle  Wide BOS lateral weightshifting/lift + added arm reach, 2 x 10 3#  Forward step over obstacle, 2 x 10 3#, 1 LOB returning to midline with narrow BOS-able to recover with UE support  Four square step activity-varied directions, EO/EC, obstacles and surfaces Some LOB with stepping to L and backwards           PATIENT EDUCATION: Education details: HEP review; additions, bed mobility technique Person educated: Patient Education method: Explanation, Demonstration, and Handouts Education comprehension: verbalized understanding, returned demonstration, and needs further education  ---------------------------------- Note: Objective measures were completed  at Evaluation unless otherwise noted.  DIAGNOSTIC FINDINGS: NA  COGNITION: Overall cognitive status: Within functional limits for tasks assessed   SENSATION: Light touch: WFL  MUSCLE TONE: LLE: Mild  POSTURE: rounded shoulders and forward head  LOWER EXTREMITY ROM:   Active ROM WFL   LOWER EXTREMITY MMT:  Grossly tested at least 4+/5   TRANSFERS: Sit to stand: Modified independence  Assistive device utilized: None     Stand to sit: Modified independence  Assistive device utilized: None      GAIT: Gait pattern:  Decreased L arm swing, decreased step length  FUNCTIONAL TESTS:  5 times sit to stand: 11.47 sec Timed up and go (TUG): 13.22 sec 10 meter walk test: 10.75 sec (3.05 ft/sec); with Stroop 14.72 sec (2.23 ft/sec) Mini-BESTest: 25/28 TUG cognitive:  13.25 sec 3 M walk backwards:  7.22 sec 360 turn R:  4.56 sec, 8 steps 360 turn L:  4.75 sec, 9 steps     *Comparison to discharge numbers below: 02/15/2023*    FTSTS:  10.56 sec    TUG 10.72 sec    Gait velocity 23M:  10.34 sec (3.17 ft/sec) 23M fast:  7.56 sec (4.34 ft/sec)    360 turn R:  7 steps, 3.68 sec 360 turn L:  7 steps, 3.72 sec    SLS RLE:  EO:  10.87; EC 5.28 sec SLS LLE: EO:  12.47, EC 10.28  TREATMENT DATE: 08/23/2023    PATIENT EDUCATION: Education details: Eval results, POC; re-start Romberg balance position of previous HEP-see below Person educated: Patient Education method: Explanation, Demonstration, and Handouts Education comprehension: verbalized understanding, returned demonstration, and needs further education  HOME EXERCISE PROGRAM: *Pulled up previous HEP and reviewed the bolded exercises* Access Code: [email or web address] URL: [email or web address] Date: 02/02/2023 Prepared by: [facility] - Outpatient  Rehab - [facility]   Program  Notes Standing on your cushion:-marching in place x 10-alternating forward step taps x 10-alternating side step taps x 10-alternating back step taps x 10*Do this standing by the counter for support as needed   Exercises - Romberg Stance Eyes Closed on Foam Pad  - 1 x daily - 5 x weekly - 1 sets - 3 reps - 30 sec hold - Romberg Stance with Head Nods on Foam Pad  - 1 x daily - 5 x weekly - 1 sets - 10 reps - Romberg Stance on Foam Pad with Head Rotation  - 1 x daily - 5 x weekly - 1 sets - 10 reps - Forward and Backward Monster Walk with Resistance at Ankles and Counter Support  - 1 x daily - 5 x weekly - 1 sets - 3-5 reps - Gastroc Stretch on Wall  - 1-2 x daily - 7 x weekly - 1 sets - 3 reps - 30 sec hold - Standing Bilateral Gastroc Stretch with Step  - 1-2 x daily - 7 x weekly - 1 sets - 30 reps - 30 sec hold - Standing Lumbar Spine Flexion Stretch Counter  - 1-2 x daily - 7 x weekly - 1 sets - 5 reps - 15 sec hold - Standing Quarter Turn with Counter Support  - 1 x daily - 7 x weekly - 1-2 sets - 10 reps  GOALS: Goals reviewed with patient? Yes  SHORT TERM GOALS: = LTGs   LONG TERM GOALS: Target date: 09/21/2023  Pt will be independent with HEP for improved balance, gait. Baseline:  Goal status: IN PROGRESS  2.  Pt will improve TUG score to less than or equal to 10 sec for decreased fall risk.  Baseline: 13.22 sec Goal status: IN PROGRESS  3.  Pt will improve gait velocity to at least 2.6 ft/sec, with novel dual task activity, for improved gait efficiency and safety. Baseline: 3 ft/sec; 2.23 ft/sec with Stroop activity Goal status: IN PROGRESS  4.  Pt will report at least 50% improved confidence with balance with eyes closed, such as in the shower. Baseline:  Goal status: IN PROGRESS   ASSESSMENT:  CLINICAL IMPRESSION: Pt presents today with no new complaints. Skilled PT session focused on bed mobility and initial stretching for flexibility and warm-up exercises to combat  stiffness in the morning.  Problem-solved various methods for bed mobility, and pt responds well to cues for use of momentum to roll supine>sidelying to sit EOM .  Also reviewed HEP and PT provides cues for increased weightshift to L side, ahead of lifting R foot, to initiate gait.  Pt responds well to cues and will continue to benefit from practice. Pt will continue to benefit from skilled PT towards goals for improved functional mobility and decreased fall risk.   EVAL:  Patient is a 68 y.o. male who was seen today for physical therapy evaluation and treatment for Parkinson's disease.   He reports he feels off-balance more with backwards walking and with eyes closed.  He does have mild sway with EC activities  and is slightly slower with TUG and gait velocity scores compared to last bout of therapy, ending 02/2023.  He has had one fall and he has had one episode of "locking up" with getting out of his vehicle, needing wife's assist.  He does demo decreased gait velocity with novel dual task activity today, compared to initial gait velocity.  He would benefit from skilled PT to address high level balance and gait for improved functional mobility and balance.  OBJECTIVE IMPAIRMENTS: Abnormal gait and decreased balance.   ACTIVITY LIMITATIONS: bathing, dressing, and locomotion level  PARTICIPATION LIMITATIONS: community activity and yard work  PERSONAL FACTORS: 3+ comorbidities: see above are also affecting patient's functional outcome.   REHAB POTENTIAL: Good  CLINICAL DECISION MAKING: Evolving/moderate complexity  EVALUATION COMPLEXITY: Moderate  PLAN:  PT FREQUENCY: 2x/week  PT DURATION: 4 weeks (may be modified to less duration, due to patient/therapist out of town during this time)  PLANNED INTERVENTIONS: 97750- Physical Performance Testing, 97110-Therapeutic exercises, 97530- Therapeutic activity, [id number]- Neuromuscular re-education, [id number]- Self Care, 13244- Gait training, Patient/Family  education, and Balance training  PLAN FOR NEXT SESSION: How did bed mobility go?  Review HEP; Check goals and recert (due to missed last week with therapist out); High level balance, dual task activities   Zaraya Delauder W., PT 09/17/2023, 8:46 AM  [facility] at [facility] [address] [place], [address] [place], [place] [address] Phone # [phone] Fax # [phone]

## 2023-09-18 ENCOUNTER — Other Ambulatory Visit: Payer: Self-pay | Admitting: Adult Health

## 2023-09-18 ENCOUNTER — Other Ambulatory Visit: Payer: Self-pay

## 2023-09-19 ENCOUNTER — Other Ambulatory Visit: Payer: Self-pay

## 2023-09-19 ENCOUNTER — Ambulatory Visit: Admitting: Physical Therapy

## 2023-09-19 ENCOUNTER — Other Ambulatory Visit: Payer: Self-pay | Admitting: Family Medicine

## 2023-09-19 ENCOUNTER — Encounter: Payer: Self-pay | Admitting: Physical Therapy

## 2023-09-19 ENCOUNTER — Other Ambulatory Visit (HOSPITAL_BASED_OUTPATIENT_CLINIC_OR_DEPARTMENT_OTHER): Payer: Self-pay

## 2023-09-19 DIAGNOSIS — E119 Type 2 diabetes mellitus without complications: Secondary | ICD-10-CM

## 2023-09-19 DIAGNOSIS — R29818 Other symptoms and signs involving the nervous system: Secondary | ICD-10-CM

## 2023-09-19 DIAGNOSIS — R2681 Unsteadiness on feet: Secondary | ICD-10-CM

## 2023-09-19 DIAGNOSIS — R2689 Other abnormalities of gait and mobility: Secondary | ICD-10-CM

## 2023-09-19 MED ORDER — METFORMIN HCL ER 500 MG PO TB24
1000.0000 mg | ORAL_TABLET | Freq: Two times a day (BID) | ORAL | 3 refills | Status: DC
  Filled 2023-09-19: qty 360, 90d supply, fill #0

## 2023-09-19 NOTE — Therapy (Signed)
 OUTPATIENT PHYSICAL THERAPY NEURO TREATMENT NOTE/RECERT   Patient Name: Todd Rice MRN: [id number] DOB:12-03-1955, 68 y.o., male Today's Date: 09/19/2023   PCP: Nonda Bays, FNP REFERRING PROVIDER: Clem Currier, NP   END OF SESSION:  PT End of Session - 09/19/23 0753     Visit Number 5    Number of Visits 9    Date for PT Re-Evaluation 10/05/23   per recert 09/19/2023   Authorization Type [employer] Medicare-reauth submitted after 09/19/2023 visit    Authorization Time Period 08/23/23-09/20/23    Authorization - Visit Number 5    Authorization - Number of Visits 9    PT Start Time 0802    PT Stop Time 0845    PT Time Calculation (min) 43 min    Activity Tolerance Patient tolerated treatment well    Behavior During Therapy [facility] for tasks assessed/performed                 Past Medical History:  Diagnosis Date   Adult BMI 50.0-59.9 kg/sq m    52.77   Allergy     Anal fissure    Anxiety    Asthma    Cancer (HCC)    kidney cancer   Depression    Diabetes mellitus without complication (HCC)    borderline   Elevated PSA 08/13/2019   H/O: gout    STABLE   Hepatic steatosis 10/01/2011   History of kidney stones    Hyperlipidemia    Hypertension    Kidney tumor    right upper kidney tumor, monitoring   OSA on CPAP    [employer] [facility]   Parkinson disease (HCC) 01/04/2016   REM sleep behavior disorder 12/06/2016   Right ureteral stone    Sleep apnea    Ureteral calculus, right 06/12/2011   Vitamin D  deficiency    Weakness    Past Surgical History:  Procedure Laterality Date   ANAL FISSURE REPAIR  10/12/2000   COLONOSCOPY N/A 07/01/2012   Procedure: COLONOSCOPY;  Surgeon: Pietro Bridegroom, MD;  Location: [facility];  Service: Endoscopy;  Laterality: N/A;   COLONOSCOPY  11/23/2022   CYSTOSCOPY WITH RETROGRADE PYELOGRAM, URETEROSCOPY AND STENT PLACEMENT Bilateral 02/26/2013   Procedure: CYSTOSCOPY WITH RETROGRADE PYELOGRAM, URETEROSCOPY AND STENT  PLACEMENT;  Surgeon: Osborn Blaze, MD;  Location: [facility];  Service: Urology;  Laterality: Bilateral;   HOLMIUM LASER APPLICATION Bilateral 02/26/2013   Procedure: HOLMIUM LASER APPLICATION;  Surgeon: Osborn Blaze, MD;  Location: [facility];  Service: Urology;  Laterality: Bilateral;   kidney stone removal     06/2011-stent placement   LEFT MEDIAL FEMORAL CONDYLE DEBRIDEMENT & DRILLING/ REMOVAL LOOSE BODY  09/15/2003   NASAL SEPTUM SURGERY  05/09/1983   ROBOT ASSISTED LAPAROSCOPIC NEPHRECTOMY Right 12/16/2021   Procedure: XI ROBOTIC ASSISTED LAPAROSCOPIC RADICAL NEPHRECTOMY;  Surgeon: Osborn Blaze, MD;  Location: [facility];  Service: Urology;  Laterality: Right;  3 HRS   STONE EXTRACTION WITH BASKET  06/12/2011   Procedure: STONE EXTRACTION WITH BASKET;  Surgeon: Mark C Ottelin, MD;  Location: [facility];  Service: Urology;  Laterality: Right;   Patient Active Problem List   Diagnosis Date Noted   GAD (generalized anxiety disorder) 08/06/2023   Sialorrhea 12/28/2022   Renal mass 12/16/2021   Constipation 03/24/2021   Gout 03/23/2021   Primary renal papillary carcinoma, 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   Hepatic steatosis 02/18/2020   Major depression in remission (HCC) 11/11/2019   Central sleep apnea due to Cheyne-Stokes respiration 08/31/2019  Hyperlipidemia associated with type 2 diabetes mellitus (HCC) 07/09/2019   Aortic atherosclerosis (HCC) by CXR on 06/10/2018 06/10/2018   Type 2 diabetes mellitus with stage 3a chronic kidney disease, without long-term current use of insulin  (HCC) 01/21/2018   OSA treated with BiPAP 01/03/2017   REM sleep behavior disorder 12/06/2016   CKD stage 3 due to type 2 diabetes mellitus (HCC) 10/23/2016   Medication management 10/23/2016   Vitamin D  deficiency 10/23/2016   Parkinson's disease (HCC) 01/04/2016   Seasonal and perennial allergic rhinitis 06/21/2014   Asthma, mild intermittent, well-controlled 06/21/2014   Pseudomonas aeruginosa  colonization 10/08/2012   CAD (coronary artery disease) 11/15/2011   Essential hypertension 11/15/2011   Morbid obesity with BMI of 40.0-44.9, adult (HCC) 11/15/2011    ONSET DATE: 08/13/2023 (MD referral)  REFERRING DIAG: G20.B1 (ICD-10-CM) - Parkinson's disease with dyskinesia without fluctuating manifestations (HCC)   THERAPY DIAG:  Unsteadiness on feet  Other abnormalities of gait and mobility  Other symptoms and signs involving the nervous system  Rationale for Evaluation and Treatment: Rehabilitation  SUBJECTIVE:                                                                                                                                                                                             SUBJECTIVE STATEMENT: Things are getting better.  Still have trouble with balance on hills, in shower.  Pt accompanied by: self  PERTINENT HISTORY: See above  PAIN:  Are you having pain? No  PRECAUTIONS: Fall  RED FLAGS: None   WEIGHT BEARING RESTRICTIONS: No  FALLS: Has patient fallen in last 6 months? Yes. Number of falls 1 fall, getting out of bed  LIVING ENVIRONMENT: Lives with: lives with their spouse Lives in: House/apartment Stairs: 12 steps with rail Has following equipment at home: walking pole  PLOF: Independent No current exercise program; does yard work  PATIENT GOALS: To get my confidence back, especially in shower  OBJECTIVE:    TODAY'S TREATMENT: 09/19/2023 Activity Comments  TUG:  10.65 sec TUG manual:  11.87 sec   Gait velocity:  9.53 sec (3.44 ft/sec)   Bed mobiity review and HEP Good return demo  Gait velocity with Stroop test:  11.53 sec with word 13.25 sec with color (2 errors)   -Wide BOS lateral weightshift/lift and hold 1-3 sec, with added arm reach -Stagger stance rocking, then rock/lift 2 x 10 reps Shoulder hike/trunk lean with dynamic SLS-min guard  Standing hip abduction 2 x 10 Standing hip/knee flex 2 x 10 3#    M-CTSIB   Condition 1: Firm Surface, EO 30 Sec, Normal Sway  Condition 2:  Firm Surface, EC 30 Sec, Mild Sway  Condition 3: Foam Surface, EO 30 Sec, Mild Sway  Condition 4: Foam Surface, EC 30 Sec, Moderate Sway       HOME EXERCISE PROGRAM:  Access Code: [email or web address] URL: [email or web address] Date: 09/19/2023 Prepared by: [facility] - Outpatient  Rehab - [facility]  Exercises - Standing Quarter Turn with Counter Support  - 1 x daily - 7 x weekly - 3 sets - 10 reps - Side to side weightshift  - 1 x daily - 7 x weekly - 3 sets - 10 reps - Stride Stance Weight Shift  - 1 x daily - 7 x weekly - 3 sets - 10 reps - Side Stepping with Counter Support  - 1 x daily - 7 x weekly - 2 sets - 10 reps - Hooklying Single Knee to Chest  - 1 x daily - 7 x weekly - 2 sets - 5 reps - 10-15 sec hold - Supine Lower Trunk Rotation  - 1 x daily - 7 x weekly - 1-2 sets - 5 reps - 15-30 sec hold - Standing Hip Abduction with Ankle Weight  - 1 x daily - 7 x weekly - 3 sets - 10 reps - Standing Marching  - 1 x daily - 7 x weekly - 3 sets - 10 reps         PATIENT EDUCATION: Education details: HEP updates to address dynamic SLS; POC/progress towards goals Person educated: Patient Education method: Explanation, Demonstration, and Handouts Education comprehension: verbalized understanding, returned demonstration, and needs further education  ---------------------------------- Note: Objective measures were completed at Evaluation unless otherwise noted.  DIAGNOSTIC FINDINGS: NA  COGNITION: Overall cognitive status: Within functional limits for tasks assessed   SENSATION: Light touch: WFL  MUSCLE TONE: LLE: Mild  POSTURE: rounded shoulders and forward head  LOWER EXTREMITY ROM:   Active ROM WFL   LOWER EXTREMITY MMT:  Grossly tested at least 4+/5   TRANSFERS: Sit to stand: Modified independence  Assistive device utilized: None     Stand to sit: Modified independence  Assistive  device utilized: None      GAIT: Gait pattern:  Decreased L arm swing, decreased step length  FUNCTIONAL TESTS:  5 times sit to stand: 11.47 sec Timed up and go (TUG): 13.22 sec 10 meter walk test: 10.75 sec (3.05 ft/sec); with Stroop 14.72 sec (2.23 ft/sec) Mini-BESTest: 25/28 TUG cognitive:  13.25 sec 3 M walk backwards:  7.22 sec 360 turn R:  4.56 sec, 8 steps 360 turn L:  4.75 sec, 9 steps     *Comparison to discharge numbers below: 02/15/2023*    FTSTS:  10.56 sec    TUG 10.72 sec    Gait velocity 52M:  10.34 sec (3.17 ft/sec) 52M fast:  7.56 sec (4.34 ft/sec)    360 turn R:  7 steps, 3.68 sec 360 turn L:  7 steps, 3.72 sec    SLS RLE:  EO:  10.87; EC 5.28 sec SLS LLE: EO:  12.47, EC 10.28  TREATMENT DATE: 08/23/2023    PATIENT EDUCATION: Education details: Eval results, POC; re-start Romberg balance position of previous HEP-see below Person educated: Patient Education method: Explanation, Demonstration, and Handouts Education comprehension: verbalized understanding, returned demonstration, and needs further education  HOME EXERCISE PROGRAM: *Pulled up previous HEP and reviewed the bolded exercises* Access Code: [email or web address] URL: [email or web address] Date: 02/02/2023 Prepared by: [facility] - Outpatient  Rehab - [facility]   Program Notes Standing on your cushion:-marching in place x 10-alternating forward step taps x 10-alternating side step taps x 10-alternating back step taps x 10*Do this standing by the counter for support as needed   Exercises - Romberg Stance Eyes Closed on Foam Pad  - 1 x daily - 5 x weekly - 1 sets - 3 reps - 30 sec hold - Romberg Stance with Head Nods on Foam Pad  - 1 x daily - 5 x weekly - 1 sets - 10 reps - Romberg Stance on Foam Pad with Head Rotation  - 1 x daily - 5 x weekly - 1 sets  - 10 reps - Forward and Backward Monster Walk with Resistance at Ankles and Counter Support  - 1 x daily - 5 x weekly - 1 sets - 3-5 reps - Gastroc Stretch on Wall  - 1-2 x daily - 7 x weekly - 1 sets - 3 reps - 30 sec hold - Standing Bilateral Gastroc Stretch with Step  - 1-2 x daily - 7 x weekly - 1 sets - 30 reps - 30 sec hold - Standing Lumbar Spine Flexion Stretch Counter  - 1-2 x daily - 7 x weekly - 1 sets - 5 reps - 15 sec hold - Standing Quarter Turn with Counter Support  - 1 x daily - 7 x weekly - 1-2 sets - 10 reps  GOALS: Goals reviewed with patient? Yes  SHORT TERM GOALS: = LTGs   LONG TERM GOALS: Target date: 09/21/2023>10/05/2023 (*UPDATED*)  Pt will be independent with progression of HEP for improved balance, gait. Baseline:  Goal status: IN PROGRESS, 09/19/2023  2.  Pt will improve TUG score to less than or equal to 10 sec for decreased fall risk.  Baseline: 13.22 sec>10.65 sec 09/19/2023 Goal status: MET, 09/19/2023  3.  Pt will improve gait velocity to at least 2.6 ft/sec, with novel dual task activity, for improved gait efficiency and safety. Baseline: 3 ft/sec; 2.23 ft/sec with Stroop activity; 2.84 ft/sec with Stroop 09/19/2023 Goal status: MET, 09/19/2023  4.  Pt will report at least 50% improved confidence with balance with eyes closed, such as in the shower. Baseline: reports 60+% for shower, with use of UE support on wall Goal status: IN PROGRESS, 09/19/2023  5.  Pt will verbalize tips to reduce freezing/festination with gait and turns.   Baseline:    Goal status:  INITIAL, 09/19/2023  6.  Pt will negotiate hills/inclines with no LOB, independently.  Baseline:  reports some imbalance on steep hills  Goal status:  INITIAL, 09/19/2023  7.  Pt will perform MCTSIB Condition 4 with mild sway.  Baseline:  mod sway  Goal status:  INITIAL, 09/19/2023   ASSESSMENT:  CLINICAL IMPRESSION: Pt presents today with reports of improvement; however, he continues to report  some episodes of freezing/festination with gait, continueing to need UE support and shower wall for dynamic balance.  Skilled PT session focused on assessing goals, with pt improving TUG and gait velocity scores, and meeting LTG 2 and 3.  With mCTSIB testing today,  pt is mod sway for condition 4 and with work on weightshifting and dynamic SLS, pt compensates with either UE support or lateral trunk lean/shoulder hiking, needing min guard assist.  With pt missing 2 weeks in this POC (due to patient, then PT out of town), pt is progressing towards goals, but still has high level balance/gait issues that would benefit from further skilled PT to address.  Updated HEP today to reflect exercises for hip stability, and pt tolerates well.   Recert completed today to capture additional goals for improved high level balance and gait for improved functional mobility, independence, and slowed disease progression.  EVAL:  Patient is a 68 y.o. male who was seen today for physical therapy evaluation and treatment for Parkinson's disease.   He reports he feels off-balance more with backwards walking and with eyes closed.  He does have mild sway with EC activities and is slightly slower with TUG and gait velocity scores compared to last bout of therapy, ending 02/2023.  He has had one fall and he has had one episode of "locking up" with getting out of his vehicle, needing wife's assist.  He does demo decreased gait velocity with novel dual task activity today, compared to initial gait velocity.  He would benefit from skilled PT to address high level balance and gait for improved functional mobility and balance.  OBJECTIVE IMPAIRMENTS: Abnormal gait and decreased balance.   ACTIVITY LIMITATIONS: bathing, dressing, and locomotion level  PARTICIPATION LIMITATIONS: community activity and yard work  PERSONAL FACTORS: 3+ comorbidities: see above are also affecting patient's functional outcome.   REHAB POTENTIAL:  Good  CLINICAL DECISION MAKING: Evolving/moderate complexity  EVALUATION COMPLEXITY: Moderate  PLAN:  PT FREQUENCY: 2x/week  PT DURATION: 2 weeks (per recert 09/19/2023)  PLANNED INTERVENTIONS: 97750- Physical Performance Testing, 97110-Therapeutic exercises, 97530- Therapeutic activity, [id number]- Neuromuscular re-education, 97535- Self Care, [id number]- Gait training, Patient/Family education, and Balance training  PLAN FOR NEXT SESSION: Review updates to HEP (did pt get ankle weights?). High level balance, single limb/dynamic balance, dual task activities, multisensory balance   Rayson Rando W., PT 09/19/2023, 8:48 AM  [facility] [address] [place], [address] [place], [place] [address] Phone # [phone] Fax # [phone]

## 2023-09-20 ENCOUNTER — Other Ambulatory Visit (HOSPITAL_BASED_OUTPATIENT_CLINIC_OR_DEPARTMENT_OTHER): Payer: Self-pay

## 2023-09-21 ENCOUNTER — Other Ambulatory Visit (HOSPITAL_BASED_OUTPATIENT_CLINIC_OR_DEPARTMENT_OTHER): Payer: Self-pay

## 2023-09-21 ENCOUNTER — Other Ambulatory Visit: Payer: Self-pay

## 2023-09-21 MED ORDER — PRAMIPEXOLE DIHYDROCHLORIDE 1 MG PO TABS
1.0000 mg | ORAL_TABLET | Freq: Three times a day (TID) | ORAL | 3 refills | Status: AC
  Filled 2023-09-21: qty 270, 90d supply, fill #0

## 2023-09-24 ENCOUNTER — Ambulatory Visit: Admitting: Physical Therapy

## 2023-09-24 ENCOUNTER — Encounter: Payer: Self-pay | Admitting: Physical Therapy

## 2023-09-24 DIAGNOSIS — R29818 Other symptoms and signs involving the nervous system: Secondary | ICD-10-CM | POA: Diagnosis not present

## 2023-09-24 DIAGNOSIS — R2681 Unsteadiness on feet: Secondary | ICD-10-CM

## 2023-09-24 DIAGNOSIS — R2689 Other abnormalities of gait and mobility: Secondary | ICD-10-CM

## 2023-09-24 NOTE — Therapy (Signed)
 OUTPATIENT PHYSICAL THERAPY NEURO TREATMENT NOTE   Patient Name: Todd Rice MRN: [id number] DOB:10-Sep-1955, 69 y.o., male Today's Date: 09/24/2023   PCP: Nonda Bays, FNP REFERRING PROVIDER: Clem Currier, NP   END OF SESSION:  PT End of Session - 09/24/23 0805     Visit Number 6    Number of Visits 9    Date for PT Re-Evaluation 10/05/23   per recert 09/19/2023   Authorization Type [employer] Medicare-reauth submitted after 09/19/2023 visit    Authorization Time Period 08/23/23-09/20/23    Authorization - Visit Number 6    Authorization - Number of Visits 9    PT Start Time 0802    PT Stop Time 0843    PT Time Calculation (min) 41 min    Activity Tolerance Patient tolerated treatment well    Behavior During Therapy [facility] for tasks assessed/performed                  Past Medical History:  Diagnosis Date   Adult BMI 50.0-59.9 kg/sq m    52.77   Allergy     Anal fissure    Anxiety    Asthma    Cancer (HCC)    kidney cancer   Depression    Diabetes mellitus without complication (HCC)    borderline   Elevated PSA 08/13/2019   H/O: gout    STABLE   Hepatic steatosis 10/01/2011   History of kidney stones    Hyperlipidemia    Hypertension    Kidney tumor    right upper kidney tumor, monitoring   OSA on CPAP    [employer] [facility]   Parkinson disease (HCC) 01/04/2016   REM sleep behavior disorder 12/06/2016   Right ureteral stone    Sleep apnea    Ureteral calculus, right 06/12/2011   Vitamin D  deficiency    Weakness    Past Surgical History:  Procedure Laterality Date   ANAL FISSURE REPAIR  10/12/2000   COLONOSCOPY N/A 07/01/2012   Procedure: COLONOSCOPY;  Surgeon: Pietro Bridegroom, MD;  Location: [facility];  Service: Endoscopy;  Laterality: N/A;   COLONOSCOPY  11/23/2022   CYSTOSCOPY WITH RETROGRADE PYELOGRAM, URETEROSCOPY AND STENT PLACEMENT Bilateral 02/26/2013   Procedure: CYSTOSCOPY WITH RETROGRADE PYELOGRAM, URETEROSCOPY AND STENT PLACEMENT;   Surgeon: Osborn Blaze, MD;  Location: [facility];  Service: Urology;  Laterality: Bilateral;   HOLMIUM Rice APPLICATION Bilateral 02/26/2013   Procedure: HOLMIUM Rice APPLICATION;  Surgeon: Osborn Blaze, MD;  Location: [facility];  Service: Urology;  Laterality: Bilateral;   kidney stone removal     06/2011-stent placement   LEFT MEDIAL FEMORAL CONDYLE DEBRIDEMENT & DRILLING/ REMOVAL LOOSE BODY  09/15/2003   NASAL SEPTUM SURGERY  05/09/1983   ROBOT ASSISTED LAPAROSCOPIC NEPHRECTOMY Right 12/16/2021   Procedure: XI ROBOTIC ASSISTED LAPAROSCOPIC RADICAL NEPHRECTOMY;  Surgeon: Osborn Blaze, MD;  Location: [facility];  Service: Urology;  Laterality: Right;  3 HRS   STONE EXTRACTION WITH BASKET  06/12/2011   Procedure: STONE EXTRACTION WITH BASKET;  Surgeon: Mark C Ottelin, MD;  Location: [facility];  Service: Urology;  Laterality: Right;   Patient Active Problem List   Diagnosis Date Noted   GAD (generalized anxiety disorder) 08/06/2023   Sialorrhea 12/28/2022   Renal mass 12/16/2021   Constipation 03/24/2021   Gout 03/23/2021   Primary renal papillary carcinoma, 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   Hepatic steatosis 02/18/2020   Major depression in remission (HCC) 11/11/2019   Central sleep apnea due to Cheyne-Stokes respiration 08/31/2019  Hyperlipidemia associated with type 2 diabetes mellitus (HCC) 07/09/2019   Aortic atherosclerosis (HCC) by CXR on 06/10/2018 06/10/2018   Type 2 diabetes mellitus with stage 3a chronic kidney disease, without long-term current use of insulin  (HCC) 01/21/2018   OSA treated with BiPAP 01/03/2017   REM sleep behavior disorder 12/06/2016   CKD stage 3 due to type 2 diabetes mellitus (HCC) 10/23/2016   Medication management 10/23/2016   Vitamin D  deficiency 10/23/2016   Parkinson's disease (HCC) 01/04/2016   Seasonal and perennial allergic rhinitis 06/21/2014   Asthma, mild intermittent, well-controlled 06/21/2014   Pseudomonas aeruginosa colonization  10/08/2012   CAD (coronary artery disease) 11/15/2011   Essential hypertension 11/15/2011   Morbid obesity with BMI of 40.0-44.9, adult (HCC) 11/15/2011    ONSET DATE: 08/13/2023 (MD referral)  REFERRING DIAG: G20.B1 (ICD-10-CM) - Parkinson's disease with dyskinesia without fluctuating manifestations (HCC)   THERAPY DIAG:  Unsteadiness on feet  Other abnormalities of gait and mobility  Other symptoms and signs involving the nervous system  Rationale for Evaluation and Treatment: Rehabilitation  SUBJECTIVE:                                                                                                                                                                                             SUBJECTIVE STATEMENT: Balance is getting better.  Got new shower mat for inside the shower and it grips better.  Pt accompanied by: self  PERTINENT HISTORY: See above  PAIN:  Are you having pain? No  PRECAUTIONS: Fall  RED FLAGS: None   WEIGHT BEARING RESTRICTIONS: No  FALLS: Has patient fallen in last 6 months? Yes. Number of falls 1 fall, getting out of bed  LIVING ENVIRONMENT: Lives with: lives with their spouse Lives in: House/apartment Stairs: 12 steps with rail Has following equipment at home: walking pole  PLOF: Independent No current exercise program; does yard work  PATIENT GOALS: To get my confidence back, especially in shower  OBJECTIVE:    TODAY'S TREATMENT: 09/24/2023 Activity Comments  Hip knee flexion 3 x 10 Standing hip abduction, 3 x 10 Forward step tap to hip extended/runner's stretch, 15 reps 4#, cues to avoid trunk lean  SLS, 3 x 10 sec Tandem, 3 x 30 sec Time to reset/start again several times With added UE activity Increased sway with added UE activity  Negotiated hills outdoor surfaces Cues for posture, step length, momentum on the incline  Standing heel/calf stretch at step, 3 x 15", with heel raises in between Good stretch           HOME  EXERCISE PROGRAM:  Access Code: [email or web address] URL: [email or web address]  Date: 09/19/2023 Prepared by: [facility] - Outpatient  Rehab - [facility]  Exercises - Standing Quarter Turn with Counter Support  - 1 x daily - 7 x weekly - 3 sets - 10 reps - Side to side weightshift  - 1 x daily - 7 x weekly - 3 sets - 10 reps - Stride Stance Weight Shift  - 1 x daily - 7 x weekly - 3 sets - 10 reps - Side Stepping with Counter Support  - 1 x daily - 7 x weekly - 2 sets - 10 reps - Hooklying Single Knee to Chest  - 1 x daily - 7 x weekly - 2 sets - 5 reps - 10-15 sec hold - Supine Lower Trunk Rotation  - 1 x daily - 7 x weekly - 1-2 sets - 5 reps - 15-30 sec hold - Standing Hip Abduction with Ankle Weight  - 1 x daily - 7 x weekly - 3 sets - 10 reps - Standing Marching  - 1 x daily - 7 x weekly - 3 sets - 10 reps         PATIENT EDUCATION: Education details: Discussed how to obtain adjustable ankle weights for home, hill negotiation tips Person educated: Patient Education method: Explanation, Demonstration, and Handouts Education comprehension: verbalized understanding, returned demonstration, and needs further education  ---------------------------------- Note: Objective measures were completed at Evaluation unless otherwise noted.  DIAGNOSTIC FINDINGS: NA  COGNITION: Overall cognitive status: Within functional limits for tasks assessed   SENSATION: Light touch: WFL  MUSCLE TONE: LLE: Mild  POSTURE: rounded shoulders and forward head  LOWER EXTREMITY ROM:   Active ROM WFL   LOWER EXTREMITY MMT:  Grossly tested at least 4+/5   TRANSFERS: Sit to stand: Modified independence  Assistive device utilized: None     Stand to sit: Modified independence  Assistive device utilized: None      GAIT: Gait pattern:  Decreased L arm swing, decreased step length  FUNCTIONAL TESTS:  5 times sit to stand: 11.47 sec Timed up and go (TUG): 13.22 sec 10 meter walk test:  10.75 sec (3.05 ft/sec); with Stroop 14.72 sec (2.23 ft/sec) Mini-BESTest: 25/28 TUG cognitive:  13.25 sec 3 M walk backwards:  7.22 sec 360 turn R:  4.56 sec, 8 steps 360 turn L:  4.75 sec, 9 steps     *Comparison to discharge numbers below: 02/15/2023*    FTSTS:  10.56 sec    TUG 10.72 sec    Gait velocity 26M:  10.34 sec (3.17 ft/sec) 26M fast:  7.56 sec (4.34 ft/sec)    360 turn R:  7 steps, 3.68 sec 360 turn L:  7 steps, 3.72 sec    SLS RLE:  EO:  10.87; EC 5.28 sec SLS LLE: EO:  12.47, EC 10.28  TREATMENT DATE: 08/23/2023    PATIENT EDUCATION: Education details: Eval results, POC; re-start Romberg balance position of previous HEP-see below Person educated: Patient Education method: Explanation, Demonstration, and Handouts Education comprehension: verbalized understanding, returned demonstration, and needs further education  HOME EXERCISE PROGRAM: *Pulled up previous HEP and reviewed the bolded exercises* Access Code: [email or web address] URL: [email or web address] Date: 02/02/2023 Prepared by: [facility] - Outpatient  Rehab - [facility]   Program Notes Standing on your cushion:-marching in place x 10-alternating forward step taps x 10-alternating side step taps x 10-alternating back step taps x 10*Do this standing by the counter for support as needed   Exercises - Romberg Stance Eyes Closed on Foam Pad  - 1 x daily - 5 x weekly - 1 sets - 3 reps - 30 sec hold - Romberg Stance with Head Nods on Foam Pad  - 1 x daily - 5 x weekly - 1 sets - 10 reps - Romberg Stance on Foam Pad with Head Rotation  - 1 x daily - 5 x weekly - 1 sets - 10 reps - Forward and Backward Monster Walk with Resistance at Ankles and Counter Support  - 1 x daily - 5 x weekly - 1 sets - 3-5 reps - Gastroc Stretch on Wall  - 1-2 x daily - 7 x weekly - 1 sets - 3  reps - 30 sec hold - Standing Bilateral Gastroc Stretch with Step  - 1-2 x daily - 7 x weekly - 1 sets - 30 reps - 30 sec hold - Standing Lumbar Spine Flexion Stretch Counter  - 1-2 x daily - 7 x weekly - 1 sets - 5 reps - 15 sec hold - Standing Quarter Turn with Counter Support  - 1 x daily - 7 x weekly - 1-2 sets - 10 reps  GOALS: Goals reviewed with patient? Yes  SHORT TERM GOALS: = LTGs   LONG TERM GOALS: Target date: 09/21/2023>10/05/2023 (*UPDATED*)  Pt will be independent with progression of HEP for improved balance, gait. Baseline:  Goal status: IN PROGRESS, 09/19/2023  2.  Pt will improve TUG score to less than or equal to 10 sec for decreased fall risk.  Baseline: 13.22 sec>10.65 sec 09/19/2023 Goal status: MET, 09/19/2023  3.  Pt will improve gait velocity to at least 2.6 ft/sec, with novel dual task activity, for improved gait efficiency and safety. Baseline: 3 ft/sec; 2.23 ft/sec with Stroop activity; 2.84 ft/sec with Stroop 09/19/2023 Goal status: MET, 09/19/2023  4.  Pt will report at least 50% improved confidence with balance with eyes closed, such as in the shower. Baseline: reports 60+% for shower, with use of UE support on wall Goal status: IN PROGRESS, 09/19/2023  5.  Pt will verbalize tips to reduce freezing/festination with gait and turns.   Baseline:    Goal status:  IN PROGRESS 09/19/2023  6.  Pt will negotiate hills/inclines with no LOB, independently.  Baseline:  reports some imbalance on steep hills  Goal status:  IN PROGRESS, 09/19/2023  7.  Pt will perform MCTSIB Condition 4 with mild sway.  Baseline:  mod sway  Goal status:  IN PROGRESS, 09/19/2023   ASSESSMENT:  CLINICAL IMPRESSION: Pt presents today with no new complaints. Skilled PT session focused on strength, balance, hill negotiation. Pt needs cues for posture, step length, use of momentum for inclines, as his hill near his home is steeper than here at clinic.  He has increased sway with SLS and  narrowed BOS with UE activities,  and has to reset several times, but improves with repetition and cues for abdominal activation.  Pt will continue to benefit from skilled PT towards goals for improved functional mobility and decreased fall risk.   EVAL:  Patient is a 68 y.o. male who was seen today for physical therapy evaluation and treatment for Parkinson's disease.   He reports he feels off-balance more with backwards walking and with eyes closed.  He does have mild sway with EC activities and is slightly slower with TUG and gait velocity scores compared to last bout of therapy, ending 02/2023.  He has had one fall and he has had one episode of "locking up" with getting out of his vehicle, needing wife's assist.  He does demo decreased gait velocity with novel dual task activity today, compared to initial gait velocity.  He would benefit from skilled PT to address high level balance and gait for improved functional mobility and balance.  OBJECTIVE IMPAIRMENTS: Abnormal gait and decreased balance.   ACTIVITY LIMITATIONS: bathing, dressing, and locomotion level  PARTICIPATION LIMITATIONS: community activity and yard work  PERSONAL FACTORS: 3+ comorbidities: see above are also affecting patient's functional outcome.   REHAB POTENTIAL: Good  CLINICAL DECISION MAKING: Evolving/moderate complexity  EVALUATION COMPLEXITY: Moderate  PLAN:  PT FREQUENCY: 2x/week  PT DURATION: 2 weeks (per recert 09/19/2023)  PLANNED INTERVENTIONS: 97750- Physical Performance Testing, 97110-Therapeutic exercises, 97530- Therapeutic activity, 97112- Neuromuscular re-education, 97535- Self Care, 16109- Gait training, Patient/Family education, and Balance training  PLAN FOR NEXT SESSION: Continue High level balance, single limb/dynamic balance, dual task activities, multisensory balance.  Ask about ankle weights   Eleftheria Taborn W., PT 09/24/2023, 8:46 AM  [facility] at [facility] [address] [place], [address] [place], [place] [address] Phone # [phone] Fax # [phone]

## 2023-09-26 ENCOUNTER — Encounter: Payer: Self-pay | Admitting: Physical Therapy

## 2023-09-26 ENCOUNTER — Ambulatory Visit: Admitting: Physical Therapy

## 2023-09-26 DIAGNOSIS — R29818 Other symptoms and signs involving the nervous system: Secondary | ICD-10-CM | POA: Diagnosis not present

## 2023-09-26 DIAGNOSIS — R2681 Unsteadiness on feet: Secondary | ICD-10-CM

## 2023-09-26 DIAGNOSIS — R2689 Other abnormalities of gait and mobility: Secondary | ICD-10-CM

## 2023-09-26 NOTE — Therapy (Signed)
 OUTPATIENT PHYSICAL THERAPY NEURO TREATMENT NOTE   Patient Name: Todd Rice MRN: [id number] DOB:[birth year], 68 y.o., male Today's Date: 09/26/2023   PCP: Nonda Bays, FNP REFERRING PROVIDER: Clem Currier, NP   END OF SESSION:  PT End of Session - 09/26/23 0755     Visit Number 7    Number of Visits 9    Date for PT Re-Evaluation 10/05/23   per recert 09/19/2023   Authorization Type UHC Medicare-approved 4 PT visits 09/20/2023 - 10/04/2023    Authorization Time Period 09/20/2023 - 10/04/2023    Authorization - Visit Number 2    Authorization - Number of Visits 4    PT Start Time 0800    PT Stop Time 0842    PT Time Calculation (min) 42 min    Activity Tolerance Patient tolerated treatment well    Behavior During Therapy [facility] for tasks assessed/performed                   Past Medical History:  Diagnosis Date   Adult BMI 50.0-59.9 kg/sq m    52.77   Allergy     Anal fissure    Anxiety    Asthma    Cancer (HCC)    kidney cancer   Depression    Diabetes mellitus without complication (HCC)    borderline   Elevated PSA 08/13/2019   H/O: gout    STABLE   Hepatic steatosis 10/01/2011   History of kidney stones    Hyperlipidemia    Hypertension    Kidney tumor    right upper kidney tumor, monitoring   OSA on CPAP    [employer] [facility]   Parkinson disease (HCC) 01/04/2016   REM sleep behavior disorder 12/06/2016   Right ureteral stone    Sleep apnea    Ureteral calculus, right 06/12/2011   Vitamin D  deficiency    Weakness    Past Surgical History:  Procedure Laterality Date   ANAL FISSURE REPAIR  10/12/2000   COLONOSCOPY N/A 07/01/2012   Procedure: COLONOSCOPY;  Surgeon: Pietro Bridegroom, MD;  Location: [facility];  Service: Endoscopy;  Laterality: N/A;   COLONOSCOPY  11/23/2022   CYSTOSCOPY WITH RETROGRADE PYELOGRAM, URETEROSCOPY AND STENT PLACEMENT Bilateral 02/26/2013   Procedure: CYSTOSCOPY WITH RETROGRADE PYELOGRAM, URETEROSCOPY AND  STENT PLACEMENT;  Surgeon: Osborn Blaze, MD;  Location: [facility];  Service: Urology;  Laterality: Bilateral;   HOLMIUM LASER APPLICATION Bilateral 02/26/2013   Procedure: HOLMIUM LASER APPLICATION;  Surgeon: Osborn Blaze, MD;  Location: [facility];  Service: Urology;  Laterality: Bilateral;   kidney stone removal     06/2011-stent placement   LEFT MEDIAL FEMORAL CONDYLE DEBRIDEMENT & DRILLING/ REMOVAL LOOSE BODY  09/15/2003   NASAL SEPTUM SURGERY  05/09/1983   ROBOT ASSISTED LAPAROSCOPIC NEPHRECTOMY Right 12/16/2021   Procedure: XI ROBOTIC ASSISTED LAPAROSCOPIC RADICAL NEPHRECTOMY;  Surgeon: Osborn Blaze, MD;  Location: [facility];  Service: Urology;  Laterality: Right;  3 HRS   STONE EXTRACTION WITH BASKET  06/12/2011   Procedure: STONE EXTRACTION WITH BASKET;  Surgeon: Mark C Ottelin, MD;  [facility];  Service: Urology;  Laterality: Right;   Patient Active Problem List   Diagnosis Date Noted   GAD (generalized anxiety disorder) 08/06/2023   Sialorrhea 12/28/2022   Renal mass 12/16/2021   Constipation 03/24/2021   Gout 03/23/2021   Primary renal papillary carcinoma, 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   Hepatic steatosis 02/18/2020   Major depression in remission (HCC) 11/11/2019   Central sleep apnea  due to Cheyne-Stokes respiration 08/31/2019   Hyperlipidemia associated with type 2 diabetes mellitus (HCC) 07/09/2019   Aortic atherosclerosis (HCC) by CXR on 06/10/2018 06/10/2018   Type 2 diabetes mellitus with stage 3a chronic kidney disease, without long-term current use of insulin  (HCC) 01/21/2018   OSA treated with BiPAP 01/03/2017   REM sleep behavior disorder 12/06/2016   CKD stage 3 due to type 2 diabetes mellitus (HCC) 10/23/2016   Medication management 10/23/2016   Vitamin D  deficiency 10/23/2016   Parkinson's disease (HCC) 01/04/2016   Seasonal and perennial allergic rhinitis 06/21/2014   Asthma, mild intermittent, well-controlled 06/21/2014   Pseudomonas  aeruginosa colonization 10/08/2012   CAD (coronary artery disease) 11/15/2011   Essential hypertension 11/15/2011   Morbid obesity with BMI of 40.0-44.9, adult (HCC) 11/15/2011    ONSET DATE: 08/13/2023 (MD referral)  REFERRING DIAG: G20.B1 (ICD-10-CM) - Parkinson's disease with dyskinesia without fluctuating manifestations (HCC)   THERAPY DIAG:  Unsteadiness on feet  Other abnormalities of gait and mobility  Other symptoms and signs involving the nervous system  Rationale for Evaluation and Treatment: Rehabilitation  SUBJECTIVE:                                                                                                                                                                                             SUBJECTIVE STATEMENT: Was a little sore from last session.  Pt accompanied by: self  PERTINENT HISTORY: See above  PAIN:  Are you having pain? No  PRECAUTIONS: Fall  RED FLAGS: None   WEIGHT BEARING RESTRICTIONS: No  FALLS: Has patient fallen in last 6 months? Yes. Number of falls 1 fall, getting out of bed  LIVING ENVIRONMENT: Lives with: lives with their spouse Lives in: House/apartment Stairs: 12 steps with rail Has following equipment at home: walking pole  PLOF: Independent No current exercise program; does yard work  PATIENT GOALS: To get my confidence back, especially in shower  OBJECTIVE:    TODAY'S TREATMENT: 09/26/2023 Activity Comments  Standing on Aire[employer]: Hip/knee flexion, 2 x 10 Forward step>back step, 2 x 10 4# BLE, BUE >1 UE support  Hip/knee flexion with opposite arm lift 4# BLE, 2# BUE  SLS, 3 x 10 sec Tandem, 3 x 30 sec Time to reset/start again several times With added UE activity, with 2# Increased sway with added UE activity  Standing heel/calf stretch at step, 3 x 15", with heel raises in between                HOME EXERCISE PROGRAM:  Access Code: [email or web address] URL: [email or web address] Date:  09/26/2023 Prepared by: [facility] - Outpatient  Rehab - [facility]  Exercises - Standing Quarter Turn with Counter Support  - 1 x daily - 7 x weekly - 3 sets - 10 reps - Side to side weightshift  - 1 x daily - 7 x weekly - 3 sets - 10 reps - Stride Stance Weight Shift  - 1 x daily - 7 x weekly - 3 sets - 10 reps - Side Stepping with Counter Support  - 1 x daily - 7 x weekly - 2 sets - 10 reps - Hooklying Single Knee to Chest  - 1 x daily - 7 x weekly - 2 sets - 5 reps - 10-15 sec hold - Supine Lower Trunk Rotation  - 1 x daily - 7 x weekly - 1-2 sets - 5 reps - 15-30 sec hold - Standing Hip Abduction with Ankle Weight  - 1 x daily - 7 x weekly - 3 sets - 10 reps - Standing Marching  - 1 x daily - 7 x weekly - 3 sets - 10 reps - Standing Bilateral Gastroc Stretch with Step  - 1 x daily - 7 x weekly - 1 sets - 3-5 reps - 15-30sec hold       PATIENT EDUCATION: Education details: Addition to HEP, community fitness/return to [employer] Person educated: Patient Education method: Programmer, multimedia, Demonstration, and Handouts Education comprehension: verbalized understanding, returned demonstration, and needs further education  ---------------------------------- Note: Objective measures were completed at Evaluation unless otherwise noted.  DIAGNOSTIC FINDINGS: NA  COGNITION: Overall cognitive status: Within functional limits for tasks assessed   SENSATION: Light touch: WFL  MUSCLE TONE: LLE: Mild  POSTURE: rounded shoulders and forward head  LOWER EXTREMITY ROM:   Active ROM WFL   LOWER EXTREMITY MMT:  Grossly tested at least 4+/5   TRANSFERS: Sit to stand: Modified independence  Assistive device utilized: None     Stand to sit: Modified independence  Assistive device utilized: None      GAIT: Gait pattern:  Decreased L arm swing, decreased step length  FUNCTIONAL TESTS:  5 times sit to stand: 11.47 sec Timed up and go (TUG): 13.22 sec 10 meter walk test: 10.75 sec (3.05  ft/sec); with Stroop 14.72 sec (2.23 ft/sec) Mini-BESTest: 25/28 TUG cognitive:  13.25 sec 3 M walk backwards:  7.22 sec 360 turn R:  4.56 sec, 8 steps 360 turn L:  4.75 sec, 9 steps     *Comparison to discharge numbers below: 02/15/2023*    FTSTS:  10.56 sec    TUG 10.72 sec    Gait velocity 28M:  10.34 sec (3.17 ft/sec) 28M fast:  7.56 sec (4.34 ft/sec)    360 turn R:  7 steps, 3.68 sec 360 turn L:  7 steps, 3.72 sec    SLS RLE:  EO:  10.87; EC 5.28 sec SLS LLE: EO:  12.47, EC 10.28  TREATMENT DATE: 08/23/2023    PATIENT EDUCATION: Education details: Eval results, POC; re-start Romberg balance position of previous HEP-see below Person educated: Patient Education method: Explanation, Demonstration, and Handouts Education comprehension: verbalized understanding, returned demonstration, and needs further education  HOME EXERCISE PROGRAM: *Pulled up previous HEP and reviewed the bolded exercises* Access Code: [email or web address] URL: [email or web address] Date: 02/02/2023 Prepared by: [facility] - Outpatient  Rehab - [facility]   Program Notes Standing on your cushion:-marching in place x 10-alternating forward step taps x 10-alternating side step taps x 10-alternating back step taps x 10*Do this standing by the counter for support as needed   Exercises - Romberg Stance Eyes Closed on Foam Pad  - 1 x daily - 5 x weekly - 1 sets - 3 reps - 30 sec hold - Romberg Stance with Head Nods on Foam Pad  - 1 x daily - 5 x weekly - 1 sets - 10 reps - Romberg Stance on Foam Pad with Head Rotation  - 1 x daily - 5 x weekly - 1 sets - 10 reps - Forward and Backward Monster Walk with Resistance at Ankles and Counter Support  - 1 x daily - 5 x weekly - 1 sets - 3-5 reps - Gastroc Stretch on Wall  - 1-2 x daily - 7 x weekly - 1 sets - 3 reps - 30 sec  hold - Standing Bilateral Gastroc Stretch with Step  - 1-2 x daily - 7 x weekly - 1 sets - 30 reps - 30 sec hold - Standing Lumbar Spine Flexion Stretch Counter  - 1-2 x daily - 7 x weekly - 1 sets - 5 reps - 15 sec hold - Standing Quarter Turn with Counter Support  - 1 x daily - 7 x weekly - 1-2 sets - 10 reps  GOALS: Goals reviewed with patient? Yes  SHORT TERM GOALS: = LTGs   LONG TERM GOALS: Target date: 09/21/2023>10/05/2023 (*UPDATED*)  Pt will be independent with progression of HEP for improved balance, gait. Baseline:  Goal status: IN PROGRESS, 09/19/2023  2.  Pt will improve TUG score to less than or equal to 10 sec for decreased fall risk.  Baseline: 13.22 sec>10.65 sec 09/19/2023 Goal status: MET, 09/19/2023  3.  Pt will improve gait velocity to at least 2.6 ft/sec, with novel dual task activity, for improved gait efficiency and safety. Baseline: 3 ft/sec; 2.23 ft/sec with Stroop activity; 2.84 ft/sec with Stroop 09/19/2023 Goal status: MET, 09/19/2023  4.  Pt will report at least 50% improved confidence with balance with eyes closed, such as in the shower. Baseline: reports 60+% for shower, with use of UE support on wall Goal status: IN PROGRESS, 09/19/2023  5.  Pt will verbalize tips to reduce freezing/festination with gait and turns.   Baseline:    Goal status:  IN PROGRESS 09/19/2023  6.  Pt will negotiate hills/inclines with no LOB, independently.  Baseline:  reports some imbalance on steep hills  Goal status:  IN PROGRESS, 09/19/2023  7.  Pt will perform MCTSIB Condition 4 with mild sway.  Baseline:  mod sway  Goal status:  IN PROGRESS, 09/19/2023   ASSESSMENT:  CLINICAL IMPRESSION: Pt presents today with no complaints. Skilled PT session focused on dynamic balance and functional strength, working on lessening UE support. With addition of UE activities, pt becomes more unsteady with SLS activities.  Pt needs min guard for stability with step up activities. Pt will  continue to benefit from skilled PT towards  goals for improved functional mobility and decreased fall risk.   EVAL:  Patient is a 68 y.o. male who was seen today for physical therapy evaluation and treatment for Parkinson's disease.   He reports he feels off-balance more with backwards walking and with eyes closed.  He does have mild sway with EC activities and is slightly slower with TUG and gait velocity scores compared to last bout of therapy, ending 02/2023.  He has had one fall and he has had one episode of "locking up" with getting out of his vehicle, needing wife's assist.  He does demo decreased gait velocity with novel dual task activity today, compared to initial gait velocity.  He would benefit from skilled PT to address high level balance and gait for improved functional mobility and balance.  OBJECTIVE IMPAIRMENTS: Abnormal gait and decreased balance.   ACTIVITY LIMITATIONS: bathing, dressing, and locomotion level  PARTICIPATION LIMITATIONS: community activity and yard work  PERSONAL FACTORS: 3+ comorbidities: see above are also affecting patient's functional outcome.   REHAB POTENTIAL: Good  CLINICAL DECISION MAKING: Evolving/moderate complexity  EVALUATION COMPLEXITY: Moderate  PLAN:  PT FREQUENCY: 2x/week  PT DURATION: 2 weeks (per recert 09/19/2023)  PLANNED INTERVENTIONS: 97750- Physical Performance Testing, 97110-Therapeutic exercises, 97530- Therapeutic activity, 97112- Neuromuscular re-education, 97535- Self Care, 65784- Gait training, Patient/Family education, and Balance training  PLAN FOR NEXT SESSION: Continue High level balance towards goasl, single limb/dynamic balance, dual task activities, multisensory balance.  Ask about ankle weights, [employer].  Check HEP/freezing and festination reduction tips   Aryav Wimberly W., PT 09/26/2023, 9:08 AM  [facility] at [facility] [address] [place], [address] [place], [place] [address] Phone # [phone] Fax # [phone]

## 2023-09-28 ENCOUNTER — Ambulatory Visit: Admitting: Occupational Therapy

## 2023-09-28 ENCOUNTER — Encounter: Payer: Self-pay | Admitting: Occupational Therapy

## 2023-09-28 DIAGNOSIS — R251 Tremor, unspecified: Secondary | ICD-10-CM

## 2023-09-28 DIAGNOSIS — R278 Other lack of coordination: Secondary | ICD-10-CM

## 2023-09-28 DIAGNOSIS — R29818 Other symptoms and signs involving the nervous system: Secondary | ICD-10-CM

## 2023-09-28 DIAGNOSIS — R293 Abnormal posture: Secondary | ICD-10-CM

## 2023-09-28 DIAGNOSIS — R29898 Other symptoms and signs involving the musculoskeletal system: Secondary | ICD-10-CM

## 2023-09-28 NOTE — Therapy (Deleted)
 OUTPATIENT OCCUPATIONAL THERAPY PARKINSON'S TREATMENT  Patient Name: Todd Rice MRN: [id number] DOB:1955-05-31, 68 y.o., male Today's Date: 09/28/2023  PCP: Dr. Vangie Genet REFERRING PROVIDER: Dr. Gwendloyn Lemming  END OF SESSION:  OT End of Session - 09/28/23 0804     Visit Number 2    Number of Visits 17    Date for OT Re-Evaluation 12/13/23    Authorization Type UHC Medicare, follows Medicare guidelines,    Authorization - Visit Number 2    Authorization - Number of Visits 10    Progress Note Due on Visit 10    OT Start Time 0802    OT Stop Time 0845    OT Time Calculation (min) 43 min    Activity Tolerance Patient tolerated treatment well    Behavior During Therapy [facility] for tasks assessed/performed              Past Medical History:  Diagnosis Date   Adult BMI 50.0-59.9 kg/sq m    52.77   Allergy     Anal fissure    Anxiety    Asthma    Cancer (HCC)    kidney cancer   Depression    Diabetes mellitus without complication (HCC)    borderline   Elevated PSA 08/13/2019   H/O: gout    STABLE   Hepatic steatosis 10/01/2011   History of kidney stones    Hyperlipidemia    Hypertension    Kidney tumor    right upper kidney tumor, monitoring   OSA on CPAP    [employer] [facility]   Parkinson disease (HCC) 01/04/2016   REM sleep behavior disorder 12/06/2016   Right ureteral stone    Sleep apnea    Ureteral calculus, right 06/12/2011   Vitamin D  deficiency    Weakness    Past Surgical History:  Procedure Laterality Date   ANAL FISSURE REPAIR  10/12/2000   COLONOSCOPY N/A 07/01/2012   Procedure: COLONOSCOPY;  Surgeon: Pietro Bridegroom, MD;  [facility];  Service: Endoscopy;  Laterality: N/A;   COLONOSCOPY  11/23/2022   CYSTOSCOPY WITH RETROGRADE PYELOGRAM, URETEROSCOPY AND STENT PLACEMENT Bilateral 02/26/2013   Procedure: CYSTOSCOPY WITH RETROGRADE PYELOGRAM, URETEROSCOPY AND STENT PLACEMENT;  Surgeon: Osborn Blaze, MD;  Location[facility];   Service: Urology;  Laterality: Bilateral;   HOLMIUM LASER APPLICATION Bilateral 02/26/2013   Procedure: HOLMIUM LASER APPLICATION;  Surgeon: Osborn Blaze, MD;  Location: [facility];  Service: Urology;  Laterality: Bilateral;   kidney stone removal     06/2011-stent placement   LEFT MEDIAL FEMORAL CONDYLE DEBRIDEMENT & DRILLING/ REMOVAL LOOSE BODY  09/15/2003   NASAL SEPTUM SURGERY  05/09/1983   ROBOT ASSISTED LAPAROSCOPIC NEPHRECTOMY Right 12/16/2021   Procedure: XI ROBOTIC ASSISTED LAPAROSCOPIC RADICAL NEPHRECTOMY;  Surgeon: Osborn Blaze, MD;  Location: [facility];  Service: Urology;  Laterality: Right;  3 HRS   STONE EXTRACTION WITH BASKET  06/12/2011   Procedure: STONE EXTRACTION WITH BASKET;  Surgeon: Mark C Ottelin, MD;  Location: [facility];  Service: Urology;  Laterality: Right;   Patient Active Problem List   Diagnosis Date Noted   GAD (generalized anxiety disorder) 08/06/2023   Sialorrhea 12/28/2022   Renal mass 12/16/2021   Constipation 03/24/2021   Gout 03/23/2021   Primary renal papillary carcinoma, 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   Hepatic steatosis 02/18/2020   Major depression in remission (HCC) 11/11/2019   Central sleep apnea due to Cheyne-Stokes respiration 08/31/2019   Hyperlipidemia associated with type 2 diabetes mellitus (HCC) 07/09/2019  Aortic atherosclerosis (HCC) by CXR on 06/10/2018 06/10/2018   Type 2 diabetes mellitus with stage 3a chronic kidney disease, without long-term current use of insulin  (HCC) 01/21/2018   OSA treated with BiPAP 01/03/2017   REM sleep behavior disorder 12/06/2016   CKD stage 3 due to type 2 diabetes mellitus (HCC) 10/23/2016   Medication management 10/23/2016   Vitamin D  deficiency 10/23/2016   Parkinson's disease (HCC) 01/04/2016   Seasonal and perennial allergic rhinitis 06/21/2014   Asthma, mild intermittent, well-controlled 06/21/2014   Pseudomonas aeruginosa colonization 10/08/2012   CAD (coronary artery disease)  11/15/2011   Essential hypertension 11/15/2011   Morbid obesity with BMI of 40.0-44.9, adult (HCC) 11/15/2011    ONSET DATE: 01/18/23 (referral date)  REFERRING DIAG: Parkinson's Disease  THERAPY DIAG:  Other symptoms and signs involving the musculoskeletal system  Other lack of coordination  Abnormal posture  Tremor  Other symptoms and signs involving the nervous system  Rationale for Evaluation and Treatment: Rehabilitation  SUBJECTIVE:   SUBJECTIVE STATEMENT: "Doing ok"    Pt accompanied by: self  PERTINENT HISTORY:  Pt returns today for 6-58month re-evaluation as recommended when last discharged from occupational therapy 06/26/22.  Pt reports that his PD medication was incr to 4x day in June due to wearing off periods.  PMH includes:  CAD, HTN, HDL, obesity, asthma, DM, CKD, vitamin D  deficiency, REM sleep disorder, OSA, aortic atherosclerosis, hx of gout, GERD, hx of anxiety, tumor on R kidney s/p nephrectomy ([place]--surgery only)  PRECAUTIONS: Fall  WEIGHT BEARING RESTRICTIONS: No  PAIN:  Are you having pain? No  FALLS: Has patient fallen in last 6 months? No  LIVING ENVIRONMENT: Lives with: lives with their family and lives with their spouse Lives in: House/apartment  PLOF: Independent, Vocation/Vocational requirements: retired, and Leisure: walking, HEP  PATIENT GOALS:  improve hands  OBJECTIVE:   HAND DOMINANCE: Right  ADLs:  Transfers/ambulation related to ADLs:  min difficulty with bed mobility (mornings) Eating: occasional assist with cutting meat, difficulty opening unsealed bottle at times (use both hands), opening packages at times  Grooming: mod I UB Dressing: mod I, slow, doesn't wear buttons LB Dressing: mod I, slow, has long handled shoe horn Toileting: mod I Bathing: mod I Tub Shower transfers: mod I (no grab bar/seat), some balance difficulties with closing eyes to wash face.   IADLs: Shopping: mod I Light housekeeping: wife does  most, mod I for tasks performed  Meal Prep: wife does most, mod I for tasks performed Community mobility:  hugging L side more, no new changes  Medication management: independent Financial management: wife has always performed  Handwriting: 100% legible and Mild micrographia, printing for 2 sentences.     MOBILITY STATUS: Independent  Currently in PT  POSTURE COMMENTS:  rounded shoulders  ACTIVITY TOLERANCE: Activity tolerance: denies difficulty  FUNCTIONAL OUTCOME MEASURES: Standing functional reach: Right: 11 inches; Left: 12 inches Fastening/unfastening 3 buttons: 32.35sec  Physical performance test: PPT#2 (simulated eating) 16.37 (holds spoon grossly) & PPT#4 (donning/doffing jacket): 18.59sec  COORDINATION: 9 Hole Peg test: Right: 33.69 sec; Left: 33.65sec with with trunk/shoulder compensation noted with each hand (decr in-hand manipulation) Box and Blocks:  Right 46 blocks, Left 46 blocks Tremors: Resting, Right, and Left, mild  UE ROM:  Bilateral shoulder flex R-150* with elbow ext WNL, L-150* with elbow ext WNL, mild decr supination bilaterally R>L and mild decr R MP ext  Otherwise bilateral UE ROM grossly WNL (no decline)  UE MMT:   Not tested  SENSATION:  Not tested Pt reports numbness intermittently in R 2nd   MUSCLE TONE: RUE: Mild and Rigidity and LUE: Mild and Rigidity  COGNITION: Overall cognitive status: bradyphrenia with word recall  VISION:  no changes   OBSERVATIONS: Bradykinesia   TODAY'S TREATMENT:                                                                                                                               09/28/23:   Reviewed PWR! Hands (basic 4) with min cueing for large amplitude.  Standing, sliding ball up wall for full shoulder flexion and posture with wt. Bearing through hands at end range for proprioception.     ***   PATIENT EDUCATION: Education details: OT eval results, Recommendation for re-eval in approx 6-8 months,  discussed that pt may benefit from grab bar in shower  Person educated: Patient Education method: Explanation Education comprehension: verbalized understanding  HOME EXERCISE PROGRAM: Evaluation only today--pt to continue with previous HEP   ASSESSMENT:  CLINICAL IMPRESSION: Patient is a 68 y.o. male who was seen today for occupational therapy evaluation for Parkinson's Disease.  Pt is familiar to this therapist and was last seen for OT 06/2022 and it recommended at discharge that pt return for re-evaluation in 6-8 months.  Pt with PMH that includes:  CAD, HTN, HDL, obesity, asthma, DM, CKD, vitamin D  deficiency, REM sleep disorder, OSA, aortic atherosclerosis, hx of gout, GERD, hx of anxiety, tumor on R kidney s/p nephrectomy.  Pt presents today with bradykinesia, rigidity, decr coordination, tremor, abnormal posture, decr balance for ADLs.  However, pt with no significant functional change in ADLs/IADLs at this time and functional measures approximate or are improved from when last seen.  Therefore, no occupational therapy is recommended at this time.  Pt will continue with current HEP.  Also recommended re-evaluation in approx 6-8 months.  PERFORMANCE DEFICITS: in functional skills including ADLs, IADLs, coordination, dexterity, tone, ROM, strength, flexibility, Fine motor control, Gross motor control, mobility, balance, and UE functional use, cognitive skills including bradyphrenia.  IMPAIRMENTS: are limiting patient from ADLs, IADLs, and leisure.   COMORBIDITIES:  may have co-morbidities  that affects occupational performance.   MODIFICATION OR ASSISTANCE TO COMPLETE EVALUATION: No modification of tasks or assist necessary to complete an evaluation.  OT OCCUPATIONAL PROFILE AND HISTORY: Detailed assessment: Review of records and additional review of physical, cognitive, psychosocial history related to current functional performance.  CLINICAL DECISION MAKING: LOW - limited treatment  options, no task modification necessary  EVALUATION COMPLEXITY: Low    PLAN:  OT FREQUENCY: eval only  OT DURATION: eval only  PLANNED INTERVENTIONS: self care/ADL training, ultrasound, and patient/family education  RECOMMENDED OTHER SERVICES: none at this time  CONSULTED AND AGREED WITH PLAN OF CARE: Patient  PLAN FOR NEXT SESSION: re-evaluation in approx 6-57months   Sybil Shrader, OTR/L 09/28/2023, 8:04 AM

## 2023-09-28 NOTE — Therapy (Addendum)
 OUTPATIENT OCCUPATIONAL THERAPY PARKINSON'S EVALUATION  Patient Name: Todd Rice MRN: [id number] DOB:1955-07-07, 68 y.o., male Today's Date: 09/28/2023  PCP: Nonda Bays, FNP REFERRING PROVIDER: Clem Currier, NP  END OF SESSION:  OT End of Session - 09/28/23 0804     Visit Number 2    Number of Visits 17    Date for OT Re-Evaluation 12/13/23    Authorization Type [employer] Medicare, follows Medicare guidelines,    Authorization - Visit Number 2    Authorization - Number of Visits 10    Progress Note Due on Visit 10    OT Start Time 0802    OT Stop Time 0900    OT Time Calculation (min) 58 min    Activity Tolerance Patient tolerated treatment well    Behavior During Therapy WFL for tasks assessed/performed               Past Medical History:  Diagnosis Date   Adult BMI 50.0-59.9 kg/sq m    52.77   Allergy     Anal fissure    Anxiety    Asthma    Cancer (HCC)    kidney cancer   Depression    Diabetes mellitus without complication (HCC)    borderline   Elevated PSA 08/13/2019   H/O: gout    STABLE   Hepatic steatosis 10/01/2011   History of kidney stones    Hyperlipidemia    Hypertension    Kidney tumor    right upper kidney tumor, monitoring   OSA on CPAP    [facility] [facility]   Parkinson disease (HCC) 01/04/2016   REM sleep behavior disorder 12/06/2016   Right ureteral stone    Sleep apnea    Ureteral calculus, right 06/12/2011   Vitamin D  deficiency    Weakness    Past Surgical History:  Procedure Laterality Date   ANAL FISSURE REPAIR  10/12/2000   COLONOSCOPY N/A 07/01/2012   Procedure: COLONOSCOPY;  Surgeon: Pietro Bridegroom, MD;  Location: [facility];  Service: Endoscopy;  Laterality: N/A;   COLONOSCOPY  11/23/2022   CYSTOSCOPY WITH RETROGRADE PYELOGRAM, URETEROSCOPY AND STENT PLACEMENT Bilateral 02/26/2013   Procedure: CYSTOSCOPY WITH RETROGRADE PYELOGRAM, URETEROSCOPY AND STENT PLACEMENT;  Surgeon: Osborn Blaze, MD;  Location: [facility];  Service: Urology;  Laterality: Bilateral;   HOLMIUM LASER APPLICATION Bilateral 02/26/2013   Procedure: HOLMIUM LASER APPLICATION;  Surgeon: Osborn Blaze, MD;  Location: [facility];  Service: Urology;  Laterality: Bilateral;   kidney stone removal     06/2011-stent placement   LEFT MEDIAL FEMORAL CONDYLE DEBRIDEMENT & DRILLING/ REMOVAL LOOSE BODY  09/15/2003   NASAL SEPTUM SURGERY  05/09/1983   ROBOT ASSISTED LAPAROSCOPIC NEPHRECTOMY Right 12/16/2021   Procedure: XI ROBOTIC ASSISTED LAPAROSCOPIC RADICAL NEPHRECTOMY;  Surgeon: Osborn Blaze, MD;  Location: [facility];  Service: Urology;  Laterality: Right;  3 HRS   STONE EXTRACTION WITH BASKET  06/12/2011   Procedure: STONE EXTRACTION WITH BASKET;  Surgeon: Mark C Ottelin, MD;  Location: [facility];  Service: Urology;  Laterality: Right;   Patient Active Problem List   Diagnosis Date Noted   GAD (generalized anxiety disorder) 08/06/2023   Sialorrhea 12/28/2022   Renal mass 12/16/2021   Constipation 03/24/2021   Gout 03/23/2021   Primary renal papillary carcinoma, 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   Hepatic steatosis 02/18/2020   Major depression in remission (HCC) 11/11/2019   Central sleep apnea due to Cheyne-Stokes respiration 08/31/2019   Hyperlipidemia associated with type 2 diabetes mellitus (  HCC) 07/09/2019   Aortic atherosclerosis (HCC) by CXR on 06/10/2018 06/10/2018   Type 2 diabetes mellitus with stage 3a chronic kidney disease, without long-term current use of insulin  (HCC) 01/21/2018   OSA treated with BiPAP 01/03/2017   REM sleep behavior disorder 12/06/2016   CKD stage 3 due to type 2 diabetes mellitus (HCC) 10/23/2016   Medication management 10/23/2016   Vitamin D  deficiency 10/23/2016   Parkinson's disease (HCC) 01/04/2016   Seasonal and perennial allergic rhinitis 06/21/2014   Asthma, mild intermittent, well-controlled 06/21/2014   Pseudomonas aeruginosa colonization 10/08/2012   CAD (coronary artery disease)  11/15/2011   Essential hypertension 11/15/2011   Morbid obesity with BMI of 40.0-44.9, adult (HCC) 11/15/2011    ONSET DATE:  09/06/23 (referral date)  REFERRING DIAG: G20.A1 (ICD-10-CM) - Parkinson's disease without dyskinesia or fluctuating manifestations (HCC)  THERAPY DIAG:  Other symptoms and signs involving the musculoskeletal system  Other lack of coordination  Abnormal posture  Tremor  Other symptoms and signs involving the nervous system  Rationale for Evaluation and Treatment: Rehabilitation  SUBJECTIVE:   SUBJECTIVE STATEMENT: Doing ok  Finishing with PT next week, sees ST in June     Pt accompanied by: self  PERTINENT HISTORY:  Pt returns today for 6-67month re-evaluation as recommended when last discharged from occupational therapy 06/26/22.  Pt reports that his PD medication was incr to 4x day in June due to wearing off periods.  PMH includes:  CAD, HTN, HDL, obesity, asthma, DM, CKD, vitamin D  deficiency, REM sleep disorder, OSA, aortic atherosclerosis, hx of gout, GERD, hx of anxiety, tumor on R kidney s/p nephrectomy ([facility]--surgery only)  PRECAUTIONS: Fall  WEIGHT BEARING RESTRICTIONS: No  PAIN:  Are you having pain? No  FALLS: Has patient fallen in last 6 months? Yes. Number of falls 1 and almost 6 months ago, fell out of bed, now has adjustable bed  LIVING ENVIRONMENT: Lives with: lives with their family and lives with their spouse Lives in: House/apartment  PLOF: Independent, Vocation/Vocational requirements: retired, and Leisure: walking, HEP  PATIENT GOALS:  improve hands  OBJECTIVE:   HAND DOMINANCE: Right  ADLs:  pt reports that some days are tougher than others, has bad Parkinsons days about 2x/wk.  Some difficulty getting items out of pocket  Transfers/ambulation related to ADLs:  min difficulty with bed mobility (mornings)--1 fall, but now has adjustable bed to go lower.  Min difficulty with sofa.  Eating: occasional assist/difficulty  with cutting meat, difficulty opening unsealed bottle at times (use both hands and gripper/towel or wife opens new ones) Grooming: mod I, wife assists near ears (but better if shaves later in the morning) UB Dressing: mod I, slow, doesn't wear buttons LB Dressing: mod I, slow, has long handled shoe horn, holds on for balance Toileting: mod I Bathing: difficulty with balance in shower with eyes closed Tub Shower transfers: mod I (no grab bar/seat)  IADLs: Shopping: mod I Light housekeeping: wife does most, mod I for tasks performed, been doing some yard work  Meal Prep: wife does most, mod I for tasks performed Community mobility:  driving--speeds up some unintentionally, stays to side more, takes more effort/concentration but works to focus more, does not drive on bad days Medication management: pt has been forgetting to take meds more (11am and 3pm dose) Financial management: wife has always performed  Handwriting: 100% legible and Moderate micrographia, printing for 3 sentences.     MOBILITY STATUS: Independent, Hx of falls, Freezing, difficulty with turns, start hesitation,  and difficulty carrying objections with ambulation  Currently in PT, has noticed changes in balance.  Using 1 walking pole at times.  POSTURE COMMENTS:  rounded shoulders  ACTIVITY TOLERANCE: Activity tolerance: denies difficulty  FUNCTIONAL OUTCOME MEASURES: Standing functional reach: Right: 10 inches; Left: 13 inches Fastening/unfastening 3 buttons: 65.63sec  Physical performance test: PPT#2 (simulated eating) NT due to time constraints & PPT#4 (donning/doffing jacket): 35.89sec PPT#2 Simulated eating in 14.13sec.  (09/28/23) Upper Extremity Functional Scale 66/80.   (09/28/23)  COORDINATION: 9 Hole Peg test: Right: 42.77 sec; Left: 41.04sec with with trunk/shoulder compensation noted with each hand (decr in-hand manipulation) Box and Blocks:  Right 46 blocks, Left 44 blocks, difficulty with hitting  divider/bradykinesia with BUEs Tremors: Resting, Right, and Left, mild  UE ROM:  Bilateral shoulder flex R-135* with elbow ext -5*, L-135* with elbow ext WNL, mild decr supination bilaterally R>L and mild decr R MP ext  Otherwise bilateral UE ROM grossly WNL   UE MMT:   Not tested  SENSATION: Not tested    MUSCLE TONE: RUE: Mild, Moderate, and Rigidity and LUE: Within functional limits  COGNITION: Overall cognitive status: bradyphrenia with word recall, pt denies recent changes  VISION:  no changes per pt  OBSERVATIONS: Bradykinesia   TODAY'S TREATMENT:                                                                09/28/23:   PWR! Hands (basic 4) x10 each with min cueing for large amplitude movements (verbal and visual).  Rolling ball up wall for bilateral end range shoulder flex and posture with wt. Bearing through hands on ball at end-range for proprioceptive input with min cueing.  Reviewed the following from coordination HEP (from previous):  flipping cards, dealing cards with thumb, sliding cards across table with each hand with finger ext and min cueing for incr movement amplitude with each hand  Also assessed PPT#2 Simulated eating in 14.13sec.   Upper Extremity Functional Scale 66/80.                                                                   09/14/23:  Evaluation completed today.     PATIENT EDUCATION: Education details: See above Person educated: Patient Education method: Explanation, Demonstration, Tactile cues, and Verbal cues Education comprehension: verbalized understanding, returned demonstration, and verbal cues required  HOME EXERCISE PROGRAM: 09/28/23:  Reviewed PWR! Hands (basic 4)       Began reviewing previous coordination HEP--see above   GOALS: Goals reviewed with patient? Yes  SHORT TERM GOALS: Target date: 10/13/23  Pt will be independent with updated HEP. Goal status: Ongoing 09/28/23  2.  Pt will improve coordination for ADLs/IADLs  as shown by improving time on 9-hole peg test by at least 5 sec bilaterally.   Baseline:  Right: 42.77 sec; Left: 41.04sec Goal status: INITIAL  3.  Pt will improve bilateral overhead reach as shown by demonstrating at least 140* shoulder flex with each UE. Baseline: 135* Goal status: Ongoing 09/28/23  4.  Pt  will improve ability to don/doff jacket and demo decr bradykinesia as shown by improving PPT#4 by at least 4sec. Baseline:  35.89sec Goal status: INITIAL   LONG TERM GOALS: Target date: 12/13/23  Pt will verbalize understanding of updated adaptive strategies for ADLs/IADLs to incr independence, safety, and ease. Goal status: Ongoing 09/28/23  2.  Pt will be able to write at least 3 sentences with only min decr in size, 100% legibility. Baseline: mod micrographia Goal status: INITIAL  3.  Pt will improve bilateral hand coordination for ADLs as shown by fastening/unfastening 3 buttons in less than 55sec. Baseline:  65.63sec Goal status: INITIAL  4.  Pt will improve ability to don/doff jacket and demo decr bradykinesia as shown by improving PPT#4 by at least 8sec. Baseline:  35.89sec Goal status: INITIAL     ASSESSMENT:  CLINICAL IMPRESSION: Pt responded well to cueing and repetition for large amplitude movements today to re-calibrate movement amplitude of BUEs.   PERFORMANCE DEFICITS: in functional skills including ADLs, IADLs, coordination, dexterity, tone, ROM, strength, flexibility, Fine motor control, Gross motor control, mobility, balance, and UE functional use, cognitive skills including perception and bradyphrenia, and psychosocial skills including environmental adaptation, habits, and routines and behaviors.   IMPAIRMENTS: are limiting patient from ADLs, IADLs, rest and sleep, leisure, and social participation.   COMORBIDITIES:  may have co-morbidities  that affects occupational performance.   MODIFICATION OR ASSISTANCE TO COMPLETE EVALUATION: Min-Moderate  modification of tasks or assist with assess necessary to complete an evaluation.  OT OCCUPATIONAL PROFILE AND HISTORY: Detailed assessment: Review of records and additional review of physical, cognitive, psychosocial history related to current functional performance.  CLINICAL DECISION MAKING: Moderate - several treatment options, min-mod task modification necessary  EVALUATION COMPLEXITY: Moderate    PLAN:  OT FREQUENCY: 1-2x/week  OT DURATION: 16 visits + eval over 12 weeks (due to scheduling purposes)  PLANNED INTERVENTIONS: 97535 self care/ADL training, 16109 therapeutic exercise, 97530 therapeutic activity, 97112 neuromuscular re-education, 97140 manual therapy, 97035 ultrasound, 97018 paraffin, 60454 moist heat, 97010 cryotherapy, passive range of motion, balance training, functional mobility training, visual/perceptual remediation/compensation, energy conservation, patient/family education, and DME and/or AE instructions  RECOMMENDED OTHER SERVICES: none at this time, pt current with PT at [facility]  CONSULTED AND AGREED WITH PLAN OF CARE: Patient  PLAN FOR NEXT SESSION: coordination, large amplitude movements, timed tying shoes   Bleu Minerd, OTR/L 09/28/2023, 9:13 AM

## 2023-10-02 ENCOUNTER — Ambulatory Visit: Attending: Adult Health | Admitting: Physical Therapy

## 2023-10-02 ENCOUNTER — Encounter: Payer: Self-pay | Admitting: Physical Therapy

## 2023-10-02 DIAGNOSIS — R29818 Other symptoms and signs involving the nervous system: Secondary | ICD-10-CM | POA: Diagnosis present

## 2023-10-02 DIAGNOSIS — R2681 Unsteadiness on feet: Secondary | ICD-10-CM | POA: Insufficient documentation

## 2023-10-02 DIAGNOSIS — R2689 Other abnormalities of gait and mobility: Secondary | ICD-10-CM | POA: Diagnosis present

## 2023-10-02 NOTE — Therapy (Signed)
 OUTPATIENT PHYSICAL THERAPY NEURO TREATMENT NOTE   Patient Name: Todd Rice MRN: [id number] DOB:1955/08/28, 68 y.o., male Today's Date: 10/02/2023   PCP: Nonda Bays, FNP REFERRING PROVIDER: Clem Currier, NP   END OF SESSION:  PT End of Session - 10/02/23 0758     Visit Number 8    Number of Visits 9    Date for PT Re-Evaluation 10/05/23   per recert 09/19/2023   Authorization Type UHC Medicare-approved 4 PT visits 09/20/2023 - 10/04/2023    Authorization Time Period 09/20/2023 - 10/04/2023    Authorization - Visit Number 3    Authorization - Number of Visits 4    PT Start Time 0800    PT Stop Time 0842    PT Time Calculation (min) 42 min    Activity Tolerance Patient tolerated treatment well    Behavior During Therapy [facility] for tasks assessed/performed                    Past Medical History:  Diagnosis Date   Adult BMI 50.0-59.9 kg/sq m    52.77   Allergy     Anal fissure    Anxiety    Asthma    Cancer (HCC)    kidney cancer   Depression    Diabetes mellitus without complication (HCC)    borderline   Elevated PSA 08/13/2019   H/O: gout    STABLE   Hepatic steatosis 10/01/2011   History of kidney stones    Hyperlipidemia    Hypertension    Kidney tumor    right upper kidney tumor, monitoring   OSA on CPAP    [employer] [facility]   Parkinson disease (HCC) 01/04/2016   REM sleep behavior disorder 12/06/2016   Right ureteral stone    Sleep apnea    Ureteral calculus, right 06/12/2011   Vitamin D  deficiency    Weakness    Past Surgical History:  Procedure Laterality Date   ANAL FISSURE REPAIR  10/12/2000   COLONOSCOPY N/A 07/01/2012   Procedure: COLONOSCOPY;  Surgeon: Pietro Bridegroom, MD;  Location: [facility];  Service: Endoscopy;  Laterality: N/A;   COLONOSCOPY  11/23/2022   CYSTOSCOPY WITH RETROGRADE PYELOGRAM, URETEROSCOPY AND STENT PLACEMENT Bilateral 02/26/2013   Procedure: CYSTOSCOPY WITH RETROGRADE PYELOGRAM, URETEROSCOPY AND  STENT PLACEMENT;  Surgeon: Osborn Blaze, MD;  Location: [facility];  Service: Urology;  Laterality: Bilateral;   HOLMIUM LASER APPLICATION Bilateral 02/26/2013   Procedure: HOLMIUM LASER APPLICATION;  Surgeon: Osborn Blaze, MD;  Location: [facility];  Service: Urology;  Laterality: Bilateral;   kidney stone removal     06/2011-stent placement   LEFT MEDIAL FEMORAL CONDYLE DEBRIDEMENT & DRILLING/ REMOVAL LOOSE BODY  09/15/2003   NASAL SEPTUM SURGERY  05/09/1983   ROBOT ASSISTED LAPAROSCOPIC NEPHRECTOMY Right 12/16/2021   Procedure: XI ROBOTIC ASSISTED LAPAROSCOPIC RADICAL NEPHRECTOMY;  Surgeon: Osborn Blaze, MD;  Location: [facility];  Service: Urology;  Laterality: Right;  3 HRS   STONE EXTRACTION WITH BASKET  06/12/2011   Procedure: STONE EXTRACTION WITH BASKET;  Surgeon: Mark C Ottelin, MD;  Location: [facility];  Service: Urology;  Laterality: Right;   Patient Active Problem List   Diagnosis Date Noted   GAD (generalized anxiety disorder) 08/06/2023   Sialorrhea 12/28/2022   Renal mass 12/16/2021   Constipation 03/24/2021   Gout 03/23/2021   Primary renal papillary carcinoma, 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   Hepatic steatosis 02/18/2020   Major depression in remission (HCC) 11/11/2019   Central sleep  apnea due to Cheyne-Stokes respiration 08/31/2019   Hyperlipidemia associated with type 2 diabetes mellitus (HCC) 07/09/2019   Aortic atherosclerosis (HCC) by CXR on 06/10/2018 06/10/2018   Type 2 diabetes mellitus with stage 3a chronic kidney disease, without long-term current use of insulin  (HCC) 01/21/2018   OSA treated with BiPAP 01/03/2017   REM sleep behavior disorder 12/06/2016   CKD stage 3 due to type 2 diabetes mellitus (HCC) 10/23/2016   Medication management 10/23/2016   Vitamin D  deficiency 10/23/2016   Parkinson's disease (HCC) 01/04/2016   Seasonal and perennial allergic rhinitis 06/21/2014   Asthma, mild intermittent, well-controlled 06/21/2014   Pseudomonas  aeruginosa colonization 10/08/2012   CAD (coronary artery disease) 11/15/2011   Essential hypertension 11/15/2011   Morbid obesity with BMI of 40.0-44.9, adult (HCC) 11/15/2011    ONSET DATE: 08/13/2023 (MD referral)  REFERRING DIAG: G20.B1 (ICD-10-CM) - Parkinson's disease with dyskinesia without fluctuating manifestations (HCC)   THERAPY DIAG:  Unsteadiness on feet  Other abnormalities of gait and mobility  Other symptoms and signs involving the nervous system  Rationale for Evaluation and Treatment: Rehabilitation  SUBJECTIVE:                                                                                                                                                                                             SUBJECTIVE STATEMENT: Nothing new.  Haven't gone to [facility] yet.  Looked at ankle weights and need to order.  Pt accompanied by: self  PERTINENT HISTORY: See above  PAIN:  Are you having pain? No  PRECAUTIONS: Fall  RED FLAGS: None   WEIGHT BEARING RESTRICTIONS: No  FALLS: Has patient fallen in last 6 months? Yes. Number of falls 1 fall, getting out of bed  LIVING ENVIRONMENT: Lives with: lives with their spouse Lives in: House/apartment Stairs: 12 steps with rail Has following equipment at home: walking pole  PLOF: Independent No current exercise program; does yard work  PATIENT GOALS: To get my confidence back, especially in shower  OBJECTIVE:    TODAY'S TREATMENT: 10/02/2023 Activity Comments  Varied direction stepping to targets-to simulate shower stepping Initial cues for deliberate foot clearance  March/stomp RLE to initiate turn To simulate shower  Standing on Airex: -marching 2 x 10 -hip abduction 2 x 10 -heel/toe raises 2 x 10 -forward/back step and weightshift 2 x 10 Light UE support   On Airex: -step ups/lift 10 reps -Feet apart/feet together EC head turns/head eyes    Standing gastroc stretch on step           HOME EXERCISE  PROGRAM:  Access Code: [email or web address] URL: [email or web address] Date: 09/26/2023  Prepared by: [facility] - Outpatient  Rehab - [facility]  Exercises - Standing Quarter Turn with Counter Support  - 1 x daily - 7 x weekly - 3 sets - 10 reps - Side to side weightshift  - 1 x daily - 7 x weekly - 3 sets - 10 reps - Stride Stance Weight Shift  - 1 x daily - 7 x weekly - 3 sets - 10 reps - Side Stepping with Counter Support  - 1 x daily - 7 x weekly - 2 sets - 10 reps - Hooklying Single Knee to Chest  - 1 x daily - 7 x weekly - 2 sets - 5 reps - 10-15 sec hold - Supine Lower Trunk Rotation  - 1 x daily - 7 x weekly - 1-2 sets - 5 reps - 15-30 sec hold - Standing Hip Abduction with Ankle Weight  - 1 x daily - 7 x weekly - 3 sets - 10 reps - Standing Marching  - 1 x daily - 7 x weekly - 3 sets - 10 reps - Standing Bilateral Gastroc Stretch with Step  - 1 x daily - 7 x weekly - 1 sets - 3-5 reps - 15-30sec hold       PATIENT EDUCATION: Education details: Reviewed community fitness/return to [facility], discussed/practiced tips to reduce freezing:  weightshifting, posture, generating power to lift/stomp feet, wide BOS Person educated: Patient Education method: Explanation, Demonstration, and Handouts Education comprehension: verbalized understanding, returned demonstration, and needs further education  ---------------------------------- Note: Objective measures were completed at Evaluation unless otherwise noted.  DIAGNOSTIC FINDINGS: NA  COGNITION: Overall cognitive status: Within functional limits for tasks assessed   SENSATION: Light touch: WFL  MUSCLE TONE: LLE: Mild  POSTURE: rounded shoulders and forward head  LOWER EXTREMITY ROM:   Active ROM WFL   LOWER EXTREMITY MMT:  Grossly tested at least 4+/5   TRANSFERS: Sit to stand: Modified independence  Assistive device utilized: None     Stand to sit: Modified independence  Assistive device utilized: None       GAIT: Gait pattern:  Decreased L arm swing, decreased step length  FUNCTIONAL TESTS:  5 times sit to stand: 11.47 sec Timed up and go (TUG): 13.22 sec 10 meter walk test: 10.75 sec (3.05 ft/sec); with Stroop 14.72 sec (2.23 ft/sec) Mini-BESTest: 25/28 TUG cognitive:  13.25 sec 3 M walk backwards:  7.22 sec 360 turn R:  4.56 sec, 8 steps 360 turn L:  4.75 sec, 9 steps     *Comparison to discharge numbers below: 02/15/2023*    FTSTS:  10.56 sec    TUG 10.72 sec    Gait velocity 51M:  10.34 sec (3.17 ft/sec) 51M fast:  7.56 sec (4.34 ft/sec)    360 turn R:  7 steps, 3.68 sec 360 turn L:  7 steps, 3.72 sec    SLS RLE:  EO:  10.87; EC 5.28 sec SLS LLE: EO:  12.47, EC 10.28  TREATMENT DATE: 08/23/2023    PATIENT EDUCATION: Education details: Eval results, POC; re-start Romberg balance position of previous HEP-see below Person educated: Patient Education method: Explanation, Demonstration, and Handouts Education comprehension: verbalized understanding, returned demonstration, and needs further education  HOME EXERCISE PROGRAM: *Pulled up previous HEP and reviewed the bolded exercises* Access Code: [email or web address] URL: [email or web address] Date: 02/02/2023 Prepared by: [facility] - Outpatient  Rehab - [facility]   Program Notes Standing on your cushion:-marching in place x 10-alternating forward step taps x 10-alternating side step taps x 10-alternating back step taps x 10*Do this standing by the counter for support as needed   Exercises - Romberg Stance Eyes Closed on Foam Pad  - 1 x daily - 5 x weekly - 1 sets - 3 reps - 30 sec hold - Romberg Stance with Head Nods on Foam Pad  - 1 x daily - 5 x weekly - 1 sets - 10 reps - Romberg Stance on Foam Pad with Head Rotation  - 1 x daily - 5 x weekly - 1 sets - 10 reps - Forward and  Backward Monster Walk with Resistance at Ankles and Counter Support  - 1 x daily - 5 x weekly - 1 sets - 3-5 reps - Gastroc Stretch on Wall  - 1-2 x daily - 7 x weekly - 1 sets - 3 reps - 30 sec hold - Standing Bilateral Gastroc Stretch with Step  - 1-2 x daily - 7 x weekly - 1 sets - 30 reps - 30 sec hold - Standing Lumbar Spine Flexion Stretch Counter  - 1-2 x daily - 7 x weekly - 1 sets - 5 reps - 15 sec hold - Standing Quarter Turn with Counter Support  - 1 x daily - 7 x weekly - 1-2 sets - 10 reps  GOALS: Goals reviewed with patient? Yes  SHORT TERM GOALS: = LTGs   LONG TERM GOALS: Target date: 09/21/2023>10/05/2023 (*UPDATED*)  Pt will be independent with progression of HEP for improved balance, gait. Baseline:  Goal status: IN PROGRESS, 09/19/2023  2.  Pt will improve TUG score to less than or equal to 10 sec for decreased fall risk.  Baseline: 13.22 sec>10.65 sec 09/19/2023 Goal status: MET, 09/19/2023  3.  Pt will improve gait velocity to at least 2.6 ft/sec, with novel dual task activity, for improved gait efficiency and safety. Baseline: 3 ft/sec; 2.23 ft/sec with Stroop activity; 2.84 ft/sec with Stroop 09/19/2023 Goal status: MET, 09/19/2023  4.  Pt will report at least 50% improved confidence with balance with eyes closed, such as in the shower. Baseline: reports 60+% for shower, with use of UE support on wall Goal status: IN PROGRESS, 09/19/2023  5.  Pt will verbalize tips to reduce freezing/festination with gait and turns.   Baseline:    Goal status:  IN PROGRESS 09/19/2023  6.  Pt will negotiate hills/inclines with no LOB, independently.  Baseline:  reports some imbalance on steep hills  Goal status:  IN PROGRESS, 09/19/2023  7.  Pt will perform MCTSIB Condition 4 with mild sway.  Baseline:  mod sway  Goal status:  IN PROGRESS, 09/19/2023   ASSESSMENT:  CLINICAL IMPRESSION: Pt presents today with no new complaints. Skilled PT session focused on dynamic balance  activities and discussed/practiced strategies for lessening festination. Pt needs intermittent UE support, especially on compliant surfaces.  He is progressing well towards goals and will be appropriate for planned discharge next visit.    EVAL:  Patient is  a 68 y.o. male who was seen today for physical therapy evaluation and treatment for Parkinson's disease.   He reports he feels off-balance more with backwards walking and with eyes closed.  He does have mild sway with EC activities and is slightly slower with TUG and gait velocity scores compared to last bout of therapy, ending 02/2023.  He has had one fall and he has had one episode of "locking up" with getting out of his vehicle, needing wife's assist.  He does demo decreased gait velocity with novel dual task activity today, compared to initial gait velocity.  He would benefit from skilled PT to address high level balance and gait for improved functional mobility and balance.  OBJECTIVE IMPAIRMENTS: Abnormal gait and decreased balance.   ACTIVITY LIMITATIONS: bathing, dressing, and locomotion level  PARTICIPATION LIMITATIONS: community activity and yard work  PERSONAL FACTORS: 3+ comorbidities: see above are also affecting patient's functional outcome.   REHAB POTENTIAL: Good  CLINICAL DECISION MAKING: Evolving/moderate complexity  EVALUATION COMPLEXITY: Moderate  PLAN:  PT FREQUENCY: 2x/week  PT DURATION: 2 weeks (per recert 09/19/2023)  PLANNED INTERVENTIONS: 97750- Physical Performance Testing, 97110-Therapeutic exercises, 97530- Therapeutic activity, [id number]- Neuromuscular re-education, 97535- Self Care, [id number]- Gait training, Patient/Family education, and Balance training  PLAN FOR NEXT SESSION: Discharge next visit.  Kelsey Patricia., PT 10/02/2023, 8:45 AM  [facility] at [facility] [address] [place], [address] [place], [place] [address] Phone # [phone] Fax # [phone]

## 2023-10-04 ENCOUNTER — Telehealth (HOSPITAL_BASED_OUTPATIENT_CLINIC_OR_DEPARTMENT_OTHER): Payer: Self-pay | Admitting: Family Medicine

## 2023-10-04 ENCOUNTER — Other Ambulatory Visit (HOSPITAL_BASED_OUTPATIENT_CLINIC_OR_DEPARTMENT_OTHER): Payer: Self-pay

## 2023-10-04 ENCOUNTER — Other Ambulatory Visit (HOSPITAL_BASED_OUTPATIENT_CLINIC_OR_DEPARTMENT_OTHER): Payer: Self-pay | Admitting: Family Medicine

## 2023-10-04 ENCOUNTER — Ambulatory Visit: Admitting: Physical Therapy

## 2023-10-04 ENCOUNTER — Encounter: Payer: Self-pay | Admitting: Physical Therapy

## 2023-10-04 DIAGNOSIS — R29818 Other symptoms and signs involving the nervous system: Secondary | ICD-10-CM

## 2023-10-04 DIAGNOSIS — R2681 Unsteadiness on feet: Secondary | ICD-10-CM | POA: Diagnosis not present

## 2023-10-04 DIAGNOSIS — R2689 Other abnormalities of gait and mobility: Secondary | ICD-10-CM

## 2023-10-04 MED ORDER — OZEMPIC (0.25 OR 0.5 MG/DOSE) 2 MG/3ML ~~LOC~~ SOPN
PEN_INJECTOR | SUBCUTANEOUS | Status: AC
Start: 2023-10-04 — End: 2023-12-03

## 2023-10-04 MED ORDER — OZEMPIC (0.25 OR 0.5 MG/DOSE) 2 MG/3ML ~~LOC~~ SOPN: PEN_INJECTOR | SUBCUTANEOUS | Status: DC

## 2023-10-04 NOTE — Therapy (Addendum)
 OUTPATIENT OCCUPATIONAL THERAPY [facility]  Patient Name: Todd Rice MRN: [id number] DOB:07-07-1955, 68 y.o., male Today's Date: 10/05/2023  PCP: Nonda Bays, FNP REFERRING PROVIDER: Clem Currier, NP  END OF SESSION:  OT End of Session - 10/05/23 0803     Visit Number 3    Number of Visits 17    Date for OT Re-Evaluation 12/13/23    Authorization Type [employer] Medicare, follows Medicare guidelines,    Authorization Time Period 16 visits approved 09/28/23-12/14/23    Authorization - Visit Number 3    Authorization - Number of Visits 10    Progress Note Due on Visit 10    OT Start Time 0802    OT Stop Time 0845    OT Time Calculation (min) 43 min    Activity Tolerance Patient tolerated treatment well    Behavior During Therapy [facility] for tasks assessed/performed                Past Medical History:  Diagnosis Date   Adult BMI 50.0-59.9 kg/sq m    52.77   Allergy     Anal fissure    Anxiety    Asthma    Cancer (HCC)    kidney cancer   Depression    Diabetes mellitus without complication (HCC)    borderline   Elevated PSA 08/13/2019   H/O: gout    STABLE   Hepatic steatosis 10/01/2011   History of kidney stones    Hyperlipidemia    Hypertension    Kidney tumor    right upper kidney tumor, monitoring   OSA on CPAP    [employer] [facility]   Parkinson disease (HCC) 01/04/2016   REM sleep behavior disorder 12/06/2016   Right ureteral stone    Sleep apnea    Ureteral calculus, right 06/12/2011   Vitamin D  deficiency    Weakness    Past Surgical History:  Procedure Laterality Date   ANAL FISSURE REPAIR  10/12/2000   COLONOSCOPY N/A 07/01/2012   Procedure: COLONOSCOPY;  Surgeon: Pietro Bridegroom, MD;  Location: [facility];  Service: Endoscopy;  Laterality: N/A;   COLONOSCOPY  11/23/2022   CYSTOSCOPY WITH RETROGRADE PYELOGRAM, URETEROSCOPY AND STENT PLACEMENT Bilateral 02/26/2013   Procedure: CYSTOSCOPY WITH RETROGRADE PYELOGRAM, URETEROSCOPY  AND STENT PLACEMENT;  Surgeon: Osborn Blaze, MD;  Location: [facility];  Service: Urology;  Laterality: Bilateral;   HOLMIUM LASER APPLICATION Bilateral 02/26/2013   Procedure: HOLMIUM LASER APPLICATION;  Surgeon: Osborn Blaze, MD;  Location: [facility];  Service: Urology;  Laterality: Bilateral;   kidney stone removal     06/2011-stent placement   LEFT MEDIAL FEMORAL CONDYLE DEBRIDEMENT & DRILLING/ REMOVAL LOOSE BODY  09/15/2003   NASAL SEPTUM SURGERY  05/09/1983   ROBOT ASSISTED LAPAROSCOPIC NEPHRECTOMY Right 12/16/2021   Procedure: XI ROBOTIC ASSISTED LAPAROSCOPIC RADICAL NEPHRECTOMY;  Surgeon: Osborn Blaze, MD;  Location: [facility];  Service: Urology;  Laterality: Right;  3 HRS   STONE EXTRACTION WITH BASKET  06/12/2011   Procedure: STONE EXTRACTION WITH BASKET;  Surgeon: Mark C Ottelin, MD;  Location: [facility];  Service: Urology;  Laterality: Right;   Patient Active Problem List   Diagnosis Date Noted   GAD (generalized anxiety disorder) 08/06/2023   Sialorrhea 12/28/2022   Renal mass 12/16/2021   Constipation 03/24/2021   Gout 03/23/2021   Primary renal papillary carcinoma, 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   Hepatic steatosis 02/18/2020   Major depression in remission (HCC) 11/11/2019   Central sleep apnea due to Cheyne-Stokes  respiration 08/31/2019   Hyperlipidemia associated with type 2 diabetes mellitus (HCC) 07/09/2019   Aortic atherosclerosis (HCC) by CXR on 06/10/2018 06/10/2018   Type 2 diabetes mellitus with stage 3a chronic kidney disease, without long-term current use of insulin  (HCC) 01/21/2018   OSA treated with BiPAP 01/03/2017   REM sleep behavior disorder 12/06/2016   CKD stage 3 due to type 2 diabetes mellitus (HCC) 10/23/2016   Medication management 10/23/2016   Vitamin D  deficiency 10/23/2016   Parkinson's disease (HCC) 01/04/2016   Seasonal and perennial allergic rhinitis 06/21/2014   Asthma, mild intermittent, well-controlled 06/21/2014   Pseudomonas  aeruginosa colonization 10/08/2012   CAD (coronary artery disease) 11/15/2011   Essential hypertension 11/15/2011   Morbid obesity with BMI of 40.0-44.9, adult (HCC) 11/15/2011    ONSET DATE:  09/06/23 (referral date)  REFERRING DIAG: G20.A1 (ICD-10-CM) - Parkinson's disease without dyskinesia or fluctuating manifestations (HCC)  THERAPY DIAG:  Unsteadiness on feet  Other symptoms and signs involving the nervous system  Other symptoms and signs involving the musculoskeletal system  Other lack of coordination  Abnormal posture  Tremor  Other abnormalities of gait and mobility  Rationale for Evaluation and Treatment: Rehabilitation  SUBJECTIVE:   SUBJECTIVE STATEMENT: Finished with PT this week.   Pt accompanied by: self  PERTINENT HISTORY:  Pt returns today for 6-2month re-evaluation as recommended when last discharged from occupational therapy 06/26/22.  Pt reports that his PD medication was incr to 4x day in June due to wearing off periods.  PMH includes:  CAD, HTN, HDL, obesity, asthma, DM, CKD, vitamin D  deficiency, REM sleep disorder, OSA, aortic atherosclerosis, hx of gout, GERD, hx of anxiety, tumor on R kidney s/p nephrectomy ([place]--surgery only)  PRECAUTIONS: Fall  WEIGHT BEARING RESTRICTIONS: No  PAIN:  Are you having pain? No  FALLS: Has patient fallen in last 6 months? Yes. Number of falls 1 and almost 6 months ago, fell out of bed, now has adjustable bed  LIVING ENVIRONMENT: Lives with: lives with their family and lives with their spouse Lives in: House/apartment  PLOF: Independent, Vocation/Vocational requirements: retired, and Leisure: walking, HEP  PATIENT GOALS:  improve hands  OBJECTIVE:   HAND DOMINANCE: Right  ADLs:  pt reports that some days are tougher than others, has bad Parkinsons days about 2x/wk.  Some difficulty getting items out of pocket Transfers/ambulation related to ADLs:  min difficulty with bed mobility (mornings)--1 fall, but  now has adjustable bed to go lower.  Min difficulty with sofa.  Eating: occasional assist/difficulty with cutting meat, difficulty opening unsealed bottle at times (use both hands and gripper/towel or wife opens new ones) Grooming: mod I, wife assists near ears (but better if shaves later in the morning) UB Dressing: mod I, slow, doesn't wear buttons LB Dressing: mod I, slow, has long handled shoe horn, holds on for balance Toileting: mod I Bathing: difficulty with balance in shower with eyes closed Tub Shower transfers: mod I (no grab bar/seat)  IADLs: Shopping: mod I Light housekeeping: wife does most, mod I for tasks performed, been doing some yard work  Meal Prep: wife does most, mod I for tasks performed Community mobility:  driving--speeds up some unintentionally, stays to side more, takes more effort/concentration but works to focus more, does not drive on bad days Medication management: pt has been forgetting to take meds more (11am and 3pm dose) Financial management: wife has always performed  Handwriting: 100% legible and Moderate micrographia, printing for 3 sentences.  MOBILITY STATUS: Independent, Hx of falls, Freezing, difficulty with turns, start hesitation, and difficulty carrying objections with ambulation  Currently in PT, has noticed changes in balance.  Using 1 walking pole at times.  POSTURE COMMENTS:  rounded shoulders  ACTIVITY TOLERANCE: Activity tolerance: denies difficulty  FUNCTIONAL OUTCOME MEASURES: Standing functional reach: Right: 10 inches; Left: 13 inches Fastening/unfastening 3 buttons: 65.63sec  Physical performance test: PPT#2 (simulated eating) NT due to time constraints & PPT#4 (donning/doffing jacket): 35.89sec PPT#2 Simulated eating in 14.13sec.  (09/28/23) Upper Extremity Functional Scale 66/80.   (09/28/23)  COORDINATION: 9 Hole Peg test: Right: 42.77 sec; Left: 41.04sec with with trunk/shoulder compensation noted with each hand (decr  in-hand manipulation) Box and Blocks:  Right 46 blocks, Left 44 blocks, difficulty with hitting divider/bradykinesia with BUEs Tremors: Resting, Right, and Left, mild  UE ROM:  Bilateral shoulder flex R-135* with elbow ext -5*, L-135* with elbow ext WNL, mild decr supination bilaterally R>L and mild decr R MP ext  Otherwise bilateral UE ROM grossly WNL   UE MMT:   Not tested  SENSATION: Not tested    MUSCLE TONE: RUE: Mild, Moderate, and Rigidity and LUE: Within functional limits  COGNITION: Overall cognitive status: bradyphrenia with word recall, pt denies recent changes  VISION:  no changes per pt  OBSERVATIONS: Bradykinesia   TODAY'S TREATMENT:                                                               10/05/23:   PWR! Hands (up) x15 with min v.c. for incr movement amplitude.    Continued review of previous coordination HEP with each hand (and updated/provided min-mod verbal and visual cueing for large amplitude movements including use of obstacles/visual targets and PWR! hands):  Isolated IP flexion/extension, individual finger lifts--matching L and R hand height, finger walking (using pencil for targets), thumb circles (with visual targets), picking up coins with each finger and thumb, stacking coins, manipulating coins in hand to place in coin bank, rotating ball in each hand (needed visual targets with L hand).  Pt instructed in how to incorporate visual targets in HEP at home to help incr movement amplitude.      09/28/23:   PWR! Hands (basic 4) x10 each with min cueing for large amplitude movements (verbal and visual).  Rolling ball up wall for bilateral end range shoulder flex and posture with wt. Bearing through hands on ball at end-range for proprioceptive input with min cueing.  Reviewed the following from coordination HEP (from previous):  flipping cards, dealing cards with thumb, sliding cards across table with each hand with finger ext and min cueing for incr  movement amplitude with each hand  Also assessed PPT#2 Simulated eating in 14.13sec.   Upper Extremity Functional Scale 66/80.                                                                   09/14/23:  Evaluation completed today.     PATIENT EDUCATION: Education details: See above regarding large amplitude movements and use of  visual targets Person educated: Patient Education method: Explanation, Demonstration, Tactile cues, and Verbal cues Education comprehension: verbalized understanding, returned demonstration, and verbal cues required  HOME EXERCISE PROGRAM: 09/28/23, 10/05/23:  Reviewed PWR! Hands (basic 4), Hand ROM HEP, Began reviewing previous coordination HEP--see above   GOALS: Goals reviewed with patient? Yes  SHORT TERM GOALS: Target date: 10/13/23  Pt will be independent with updated HEP. Goal status: ongoing 10/05/23  2.  Pt will improve coordination for ADLs/IADLs as shown by improving time on 9-hole peg test by at least 5 sec bilaterally.   Baseline:  Right: 42.77 sec; Left: 41.04sec Goal status: INITIAL  3.  Pt will improve bilateral overhead reach as shown by demonstrating at least 140* shoulder flex with each UE. Baseline: 135* Goal status: INITIAL  4.  Pt will improve ability to don/doff jacket and demo decr bradykinesia as shown by improving PPT#4 by at least 4sec. Baseline:  35.89sec Goal status: INITIAL   LONG TERM GOALS: Target date: 12/13/23  Pt will verbalize understanding of updated adaptive strategies for ADLs/IADLs to incr independence, safety, and ease. Goal status: INITIAL  2.  Pt will be able to write at least 3 sentences with only min decr in size, 100% legibility. Baseline: mod micrographia Goal status: INITIAL  3.  Pt will improve bilateral hand coordination for ADLs as shown by fastening/unfastening 3 buttons in less than 55sec. Baseline:  65.63sec Goal status: INITIAL  4.  Pt will improve ability to don/doff jacket and demo decr  bradykinesia as shown by improving PPT#4 by at least 8sec. Baseline:  35.89sec Goal status: INITIAL     ASSESSMENT:  CLINICAL IMPRESSION: Pt continues to respond well to cueing and repetition for large amplitude movements, but needs reinforcement and cueing to ensure calibration.  Pt benefits from visual targets to incr R hand movement amplitude in addition to verbal cueing.  PERFORMANCE DEFICITS: in functional skills including ADLs, IADLs, coordination, dexterity, tone, ROM, strength, flexibility, Fine motor control, Gross motor control, mobility, balance, and UE functional use, cognitive skills including perception and bradyphrenia, and psychosocial skills including environmental adaptation, habits, and routines and behaviors.   IMPAIRMENTS: are limiting patient from ADLs, IADLs, rest and sleep, leisure, and social participation.   COMORBIDITIES:  may have co-morbidities  that affects occupational performance.   MODIFICATION OR ASSISTANCE TO COMPLETE EVALUATION: Min-Moderate modification of tasks or assist with assess necessary to complete an evaluation.  OT OCCUPATIONAL PROFILE AND HISTORY: Detailed assessment: Review of records and additional review of physical, cognitive, psychosocial history related to current functional performance.  CLINICAL DECISION MAKING: Moderate - several treatment options, min-mod task modification necessary  EVALUATION COMPLEXITY: Moderate    PLAN:  OT FREQUENCY: 1-2x/week  OT DURATION: 16 visits + eval over 12 weeks (due to scheduling purposes)  PLANNED INTERVENTIONS: 97535 self care/ADL training, 21308 therapeutic exercise, 97530 therapeutic activity, 97112 neuromuscular re-education, 97140 manual therapy, 97035 ultrasound, 97018 paraffin, 65784 moist heat, 97010 cryotherapy, passive range of motion, balance training, functional mobility training, visual/perceptual remediation/compensation, energy conservation, patient/family education, and DME  and/or AE instructions  RECOMMENDED OTHER SERVICES: none at this time, pt current with PT at [facility]  CONSULTED AND AGREED WITH PLAN OF CARE: Patient  PLAN FOR NEXT SESSION:  coordination, large amplitude movements, timed tying shoes--to bring tie shoes next session, review putty exercises for finger ext--to bring putty next session, issue updated comprehensive hand/coordination HEP, schedule additional visits  Davette Nugent, OTR/L 10/05/2023, 10:13 AM

## 2023-10-04 NOTE — Telephone Encounter (Signed)
 Patient came in to the office and would like to know if he is approved for his ozempic  he said it has been 14 days since he received the approval letter.  He would like to start taking it.  Please advise.

## 2023-10-04 NOTE — Therapy (Signed)
 OUTPATIENT PHYSICAL THERAPY NEURO TREATMENT NOTE/DISCHARGE SUMMARY   Patient Name: Todd Rice MRN: [id number] DOB:[birth year], 68 y.o., male Today's Date: 10/04/2023   PCP: Nonda Bays, FNP REFERRING PROVIDER: Clem Currier, NP   PHYSICAL THERAPY DISCHARGE SUMMARY  Visits from Start of Care: 9  Current functional level related to goals / functional outcomes: Pt has met all LTGs-see above   Remaining deficits: High level balance   Education / Equipment: HEP progressions, tips to reduce freezing/festination, community fitness.   Patient agrees to discharge. Patient goals were met. Patient is being discharged due to meeting the stated rehab goals.  Recommend PT return screen in 6-9 months.  END OF SESSION:  PT End of Session - 10/04/23 0805     Visit Number 9    Number of Visits 9    Date for PT Re-Evaluation 10/05/23   per recert 09/19/2023   Authorization Type [employer] Medicare-approved 4 PT visits 09/20/2023 - 10/04/2023    Authorization Time Period 09/20/2023 - 10/04/2023    Authorization - Visit Number 4    Authorization - Number of Visits 4    PT Start Time 0803    PT Stop Time 0841    PT Time Calculation (min) 38 min    Activity Tolerance Patient tolerated treatment well    Behavior During Therapy [facility] for tasks assessed/performed                     Past Medical History:  Diagnosis Date   Adult BMI 50.0-59.9 kg/sq m    52.77   Allergy     Anal fissure    Anxiety    Asthma    Cancer (HCC)    kidney cancer   Depression    Diabetes mellitus without complication (HCC)    borderline   Elevated PSA 08/13/2019   H/O: gout    STABLE   Hepatic steatosis 10/01/2011   History of kidney stones    Hyperlipidemia    Hypertension    Kidney tumor    right upper kidney tumor, monitoring   OSA on CPAP    [employer] [facility]   Parkinson disease (HCC) 01/04/2016   REM sleep behavior disorder 12/06/2016   Right ureteral stone    Sleep apnea     Ureteral calculus, right 06/12/2011   Vitamin D  deficiency    Weakness    Past Surgical History:  Procedure Laterality Date   ANAL FISSURE REPAIR  10/12/2000   COLONOSCOPY N/A 07/01/2012   Procedure: COLONOSCOPY;  Surgeon: Pietro Bridegroom, MD;  Location: [facility];  Service: Endoscopy;  Laterality: N/A;   COLONOSCOPY  11/23/2022   CYSTOSCOPY WITH RETROGRADE PYELOGRAM, URETEROSCOPY AND STENT PLACEMENT Bilateral 02/26/2013   Procedure: CYSTOSCOPY WITH RETROGRADE PYELOGRAM, URETEROSCOPY AND STENT PLACEMENT;  Surgeon: Osborn Blaze, MD;  Location: [facility];  Service: Urology;  Laterality: Bilateral;   HOLMIUM LASER APPLICATION Bilateral 02/26/2013   Procedure: HOLMIUM LASER APPLICATION;  Surgeon: Osborn Blaze, MD;  Location: [facility];  Service: Urology;  Laterality: Bilateral;   kidney stone removal     06/2011-stent placement   LEFT MEDIAL FEMORAL CONDYLE DEBRIDEMENT & DRILLING/ REMOVAL LOOSE BODY  09/15/2003   NASAL SEPTUM SURGERY  05/09/1983   ROBOT ASSISTED LAPAROSCOPIC NEPHRECTOMY Right 12/16/2021   Procedure: XI ROBOTIC ASSISTED LAPAROSCOPIC RADICAL NEPHRECTOMY;  Surgeon: Osborn Blaze, MD;  Location: [facility];  Service: Urology;  Laterality: Right;  3 HRS   STONE EXTRACTION WITH BASKET  06/12/2011   Procedure: STONE EXTRACTION WITH BASKET;  Surgeon: Mark C Ottelin, MD;  Location: [facility];  Service: Urology;  Laterality: Right;   Patient Active Problem List   Diagnosis Date Noted   GAD (generalized anxiety disorder) 08/06/2023   Sialorrhea 12/28/2022   Renal mass 12/16/2021   Constipation 03/24/2021   Gout 03/23/2021   Primary renal papillary carcinoma, 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   Hepatic steatosis 02/18/2020   Major depression in remission (HCC) 11/11/2019   Central sleep apnea due to Cheyne-Stokes respiration 08/31/2019   Hyperlipidemia associated with type 2 diabetes mellitus (HCC) 07/09/2019   Aortic atherosclerosis (HCC) by CXR on 06/10/2018 06/10/2018   Type  2 diabetes mellitus with stage 3a chronic kidney disease, without long-term current use of insulin  (HCC) 01/21/2018   OSA treated with BiPAP 01/03/2017   REM sleep behavior disorder 12/06/2016   CKD stage 3 due to type 2 diabetes mellitus (HCC) 10/23/2016   Medication management 10/23/2016   Vitamin D  deficiency 10/23/2016   Parkinson's disease (HCC) 01/04/2016   Seasonal and perennial allergic rhinitis 06/21/2014   Asthma, mild intermittent, well-controlled 06/21/2014   Pseudomonas aeruginosa colonization 10/08/2012   CAD (coronary artery disease) 11/15/2011   Essential hypertension 11/15/2011   Morbid obesity with BMI of 40.0-44.9, adult (HCC) 11/15/2011    ONSET DATE: 08/13/2023 (MD referral)  REFERRING DIAG: G20.B1 (ICD-10-CM) - Parkinson's disease with dyskinesia without fluctuating manifestations (HCC)   THERAPY DIAG:  Unsteadiness on feet  Other abnormalities of gait and mobility  Other symptoms and signs involving the nervous system  Rationale for Evaluation and Treatment: Rehabilitation  SUBJECTIVE:                                                                                                                                                                                             SUBJECTIVE STATEMENT: Feel good about discharging today.  Pt accompanied by: self  PERTINENT HISTORY: See above  PAIN:  Are you having pain? No  PRECAUTIONS: Fall  RED FLAGS: None   WEIGHT BEARING RESTRICTIONS: No  FALLS: Has patient fallen in last 6 months? Yes. Number of falls 1 fall, getting out of bed  LIVING ENVIRONMENT: Lives with: lives with their spouse Lives in: House/apartment Stairs: 12 steps with rail Has following equipment at home: walking pole  PLOF: Independent No current exercise program; does yard work  PATIENT GOALS: To get my confidence back, especially in shower  OBJECTIVE:    TODAY'S TREATMENT: 10/04/2023 Activity Comments  FTSTS:  10.5 sec    TUG  10.32 sec   10 M walk:  8.69 sec (3.77 ft/sec) 10 M walk fast:  7 sec (4.69 ft/sec)  3 M back:  4.4 sec   Reviewed tips to reduce freezing Reports this is better  Reviewed HEP, including foam balance from previous HEP Good return demo, use of 4# weights   Hip extension 10 reps 4#       M-CTSIB  Condition 1: Firm Surface, EO 30 Sec, Normal Sway  Condition 2: Firm Surface, EC 30 Sec, Normal Sway  Condition 3: Foam Surface, EO 30 Sec, Normal Sway  Condition 4: Foam Surface, EC 30 Sec, Mild Sway        HOME EXERCISE PROGRAM:  Access Code: [email or web address] URL: [email or web address] Date: 09/26/2023 Prepared by: [facility] - Outpatient  Rehab - [facility]  Exercises - Standing Quarter Turn with Counter Support  - 1 x daily - 7 x weekly - 3 sets - 10 reps - Side to side weightshift  - 1 x daily - 7 x weekly - 3 sets - 10 reps - Stride Stance Weight Shift  - 1 x daily - 7 x weekly - 3 sets - 10 reps - Side Stepping with Counter Support  - 1 x daily - 7 x weekly - 2 sets - 10 reps - Hooklying Single Knee to Chest  - 1 x daily - 7 x weekly - 2 sets - 5 reps - 10-15 sec hold - Supine Lower Trunk Rotation  - 1 x daily - 7 x weekly - 1-2 sets - 5 reps - 15-30 sec hold - Standing Hip Abduction with Ankle Weight  - 1 x daily - 7 x weekly - 3 sets - 10 reps - Standing Marching  - 1 x daily - 7 x weekly - 3 sets - 10 reps - Standing Bilateral Gastroc Stretch with Step  - 1 x daily - 7 x weekly - 1 sets - 3-5 reps - 15-30sec hold       PATIENT EDUCATION: Education details: Progress towards goals, review of HEP, POC and plans for d/c this visit.  Discussed return screen in 6-9 months (pt will wait to schedule screen until he sees speech for screen in mid-June) Person educated: Patient Education method: Explanation, Demonstration, and Handouts Education comprehension: verbalized understanding, returned demonstration, and needs further education   ---------------------------------- Note: Objective measures were completed at Evaluation unless otherwise noted.  DIAGNOSTIC FINDINGS: NA  COGNITION: Overall cognitive status: Within functional limits for tasks assessed   SENSATION: Light touch: WFL  MUSCLE TONE: LLE: Mild  POSTURE: rounded shoulders and forward head  LOWER EXTREMITY ROM:   Active ROM WFL   LOWER EXTREMITY MMT:  Grossly tested at least 4+/5   TRANSFERS: Sit to stand: Modified independence  Assistive device utilized: None     Stand to sit: Modified independence  Assistive device utilized: None      GAIT: Gait pattern:  Decreased L arm swing, decreased step length  FUNCTIONAL TESTS:  5 times sit to stand: 11.47 sec Timed up and go (TUG): 13.22 sec 10 meter walk test: 10.75 sec (3.05 ft/sec); with Stroop 14.72 sec (2.23 ft/sec) Mini-BESTest: 25/28 TUG cognitive:  13.25 sec 3 M walk backwards:  7.22 sec 360 turn R:  4.56 sec, 8 steps 360 turn L:  4.75 sec, 9 steps     *Comparison to discharge numbers below: 02/15/2023*    FTSTS:  10.56 sec    TUG 10.72 sec    Gait velocity 38M:  10.34 sec (3.17 ft/sec) 38M fast:  7.56 sec (4.34 ft/sec)    360 turn R:  7 steps, 3.68  sec 360 turn L:  7 steps, 3.72 sec    SLS RLE:  EO:  10.87; EC 5.28 sec SLS LLE: EO:  12.47, EC 10.28                                                                                                                                   TREATMENT DATE: 08/23/2023    PATIENT EDUCATION: Education details: Eval results, POC; re-start Romberg balance position of previous HEP-see below Person educated: Patient Education method: Explanation, Demonstration, and Handouts Education comprehension: verbalized understanding, returned demonstration, and needs further education  HOME EXERCISE PROGRAM: *Pulled up previous HEP and reviewed the bolded exercises* Access Code: [email or web address] URL: [email or web address] Date:  02/02/2023 Prepared by: [facility] - Outpatient  Rehab - [facility]   Program Notes Standing on your cushion:-marching in place x 10-alternating forward step taps x 10-alternating side step taps x 10-alternating back step taps x 10*Do this standing by the counter for support as needed   Exercises - Romberg Stance Eyes Closed on Foam Pad  - 1 x daily - 5 x weekly - 1 sets - 3 reps - 30 sec hold - Romberg Stance with Head Nods on Foam Pad  - 1 x daily - 5 x weekly - 1 sets - 10 reps - Romberg Stance on Foam Pad with Head Rotation  - 1 x daily - 5 x weekly - 1 sets - 10 reps - Forward and Backward Monster Walk with Resistance at Ankles and Counter Support  - 1 x daily - 5 x weekly - 1 sets - 3-5 reps - Gastroc Stretch on Wall  - 1-2 x daily - 7 x weekly - 1 sets - 3 reps - 30 sec hold - Standing Bilateral Gastroc Stretch with Step  - 1-2 x daily - 7 x weekly - 1 sets - 30 reps - 30 sec hold - Standing Lumbar Spine Flexion Stretch Counter  - 1-2 x daily - 7 x weekly - 1 sets - 5 reps - 15 sec hold - Standing Quarter Turn with Counter Support  - 1 x daily - 7 x weekly - 1-2 sets - 10 reps  GOALS: Goals reviewed with patient? Yes  SHORT TERM GOALS: = LTGs   LONG TERM GOALS: Target date: 09/21/2023>10/05/2023 (*UPDATED*)  Pt will be independent with progression of HEP for improved balance, gait. Baseline:  Goal status: MET, 10/04/2023  2.  Pt will improve TUG score to less than or equal to 10 sec for decreased fall risk.  Baseline: 13.22 sec>10.65 sec 09/19/2023 Goal status: MET, 09/19/2023  3.  Pt will improve gait velocity to at least 2.6 ft/sec, with novel dual task activity, for improved gait efficiency and safety. Baseline: 3 ft/sec; 2.23 ft/sec with Stroop activity; 2.84 ft/sec with Stroop 09/19/2023 Goal status: MET, 09/19/2023  4.  Pt will report at least 50% improved confidence with balance with eyes closed, such as  in the shower. Baseline: reports >60+% for shower, with  intermittent support on wall Goal status: MET, 10/04/2023  5.  Pt will verbalize tips to reduce freezing/festination with gait and turns.   Baseline:    Goal status:  MET, 10/04/2023  6.  Pt will negotiate hills/inclines with no LOB, independently.  Baseline:  reports some imbalance on steep hills  Goal status:  MET, 10/04/2023  7.  Pt will perform MCTSIB Condition 4 with mild sway.  Baseline:  mod sway  Goal status: MET, 10/04/2023   ASSESSMENT:  CLINICAL IMPRESSION: Pt presents today with no new complaints. Skilled PT session focused on assessing LTGs.  He has met all LTGs.  He has made improvements in all objective measures today, compared to eval (FTSTS, TUG, 60M, 3 M backwards walk).  He is ordering weights for home and has plans to go by [facility] today to discuss continued fitness plans.  He is appropriate for discharge this visit.  OBJECTIVE IMPAIRMENTS: Abnormal gait and decreased balance.   ACTIVITY LIMITATIONS: bathing, dressing, and locomotion level  PARTICIPATION LIMITATIONS: community activity and yard work  PERSONAL FACTORS: 3+ comorbidities: see above are also affecting patient's functional outcome.   REHAB POTENTIAL: Good  CLINICAL DECISION MAKING: Evolving/moderate complexity  EVALUATION COMPLEXITY: Moderate  PLAN:  PT FREQUENCY: 2x/week  PT DURATION: 2 weeks (per recert 09/19/2023)  PLANNED INTERVENTIONS: 97750- Physical Performance Testing, 97110-Therapeutic exercises, 97530- Therapeutic activity, [id number]- Neuromuscular re-education, 97535- Self Care, 16109- Gait training, Patient/Family education, and Balance training  PLAN FOR NEXT SESSION: Discharge today and plan for return screen in 6-9 months.  Kelsey Patricia., PT 10/04/2023, 8:46 AM  [facility] at [facility] [address] [place], [address] [place], [place] [address] Phone # [phone] Fax # [phone]

## 2023-10-04 NOTE — Telephone Encounter (Signed)
 Mychart sent to pt of response from [place]. Will await message from pt.

## 2023-10-04 NOTE — Telephone Encounter (Signed)
 Called [employer] about pt's patient assistance paperwork and the Ozempic  due to needing to know if patient's medications might've gotten sent to the office the same time we received samples.   When speaking with representative, he stated he was seeing that pt's application was approved through 05/07/24. Patient had Ozempic  1mg  that was shipped on 09/20/23 and was delivered on 09/21/23 to [facility] location.   Routing this to [place] so we can get everything taken care of with the medication and with how patient is needing to start taking it so I can then call patient about the medication.

## 2023-10-04 NOTE — Telephone Encounter (Signed)
 Alexis, please advise on the Ozempic  Rx if the dose that is showing on pt's current med list would be okay to refill for pt or if he will need to start at a different dose.

## 2023-10-05 ENCOUNTER — Encounter: Payer: Self-pay | Admitting: Occupational Therapy

## 2023-10-05 ENCOUNTER — Other Ambulatory Visit: Payer: Self-pay

## 2023-10-05 ENCOUNTER — Ambulatory Visit: Admitting: Occupational Therapy

## 2023-10-05 ENCOUNTER — Encounter (HOSPITAL_BASED_OUTPATIENT_CLINIC_OR_DEPARTMENT_OTHER): Payer: Self-pay | Admitting: Family Medicine

## 2023-10-05 ENCOUNTER — Other Ambulatory Visit (HOSPITAL_BASED_OUTPATIENT_CLINIC_OR_DEPARTMENT_OTHER): Payer: Self-pay | Admitting: Family Medicine

## 2023-10-05 DIAGNOSIS — R251 Tremor, unspecified: Secondary | ICD-10-CM

## 2023-10-05 DIAGNOSIS — R2681 Unsteadiness on feet: Secondary | ICD-10-CM

## 2023-10-05 DIAGNOSIS — E1122 Type 2 diabetes mellitus with diabetic chronic kidney disease: Secondary | ICD-10-CM

## 2023-10-05 DIAGNOSIS — R293 Abnormal posture: Secondary | ICD-10-CM

## 2023-10-05 DIAGNOSIS — R29818 Other symptoms and signs involving the nervous system: Secondary | ICD-10-CM

## 2023-10-05 DIAGNOSIS — R278 Other lack of coordination: Secondary | ICD-10-CM

## 2023-10-05 DIAGNOSIS — R2689 Other abnormalities of gait and mobility: Secondary | ICD-10-CM

## 2023-10-05 DIAGNOSIS — R29898 Other symptoms and signs involving the musculoskeletal system: Secondary | ICD-10-CM

## 2023-10-05 NOTE — Patient Instructions (Addendum)
 Todd Rice

## 2023-10-05 NOTE — Telephone Encounter (Signed)
 Called and spoke with pt letting him know that we did receive his shipment of Ozempic  at our office which accidentally had gotten mixed in with our samples of Ozempic  that we receive.  Told pt that I have set all aside for him to come by to pick up. Also told him that I will have all instructions written out for him from [place] about how he is to take the Ozempic  (with what dose to start with). Patient verbalized understanding and stated that he will be by 10/05/23 to pick it up.

## 2023-10-05 NOTE — Addendum Note (Signed)
 Addended by: Eve Hinders on: 10/05/2023 08:33 AM   Modules accepted: Orders

## 2023-10-08 ENCOUNTER — Other Ambulatory Visit (HOSPITAL_BASED_OUTPATIENT_CLINIC_OR_DEPARTMENT_OTHER): Payer: Self-pay

## 2023-10-08 ENCOUNTER — Other Ambulatory Visit (HOSPITAL_BASED_OUTPATIENT_CLINIC_OR_DEPARTMENT_OTHER): Payer: Self-pay | Admitting: Family Medicine

## 2023-10-08 DIAGNOSIS — E1169 Type 2 diabetes mellitus with other specified complication: Secondary | ICD-10-CM

## 2023-10-08 DIAGNOSIS — N1831 Chronic kidney disease, stage 3a: Secondary | ICD-10-CM

## 2023-10-08 DIAGNOSIS — E1122 Type 2 diabetes mellitus with diabetic chronic kidney disease: Secondary | ICD-10-CM

## 2023-10-08 MED ORDER — TAMSULOSIN HCL 0.4 MG PO CAPS
0.4000 mg | ORAL_CAPSULE | Freq: Every day | ORAL | 0 refills | Status: DC
  Filled 2023-10-08: qty 30, 30d supply, fill #0

## 2023-10-08 MED ORDER — DOXYCYCLINE HYCLATE 100 MG PO TABS
100.0000 mg | ORAL_TABLET | Freq: Two times a day (BID) | ORAL | 0 refills | Status: DC
  Filled 2023-10-08: qty 14, 7d supply, fill #0

## 2023-10-09 ENCOUNTER — Ambulatory Visit: Payer: Medicare Other | Admitting: Adult Health

## 2023-10-15 ENCOUNTER — Encounter (HOSPITAL_BASED_OUTPATIENT_CLINIC_OR_DEPARTMENT_OTHER): Payer: Self-pay | Admitting: *Deleted

## 2023-10-15 ENCOUNTER — Ambulatory Visit (HOSPITAL_BASED_OUTPATIENT_CLINIC_OR_DEPARTMENT_OTHER): Admitting: *Deleted

## 2023-10-15 VITALS — Ht 67.0 in | Wt 250.0 lb

## 2023-10-15 DIAGNOSIS — Z0000 Encounter for general adult medical examination without abnormal findings: Secondary | ICD-10-CM | POA: Diagnosis not present

## 2023-10-15 NOTE — Progress Notes (Addendum)
 Subjective:   Todd Rice is a 68 y.o. male who presents for Medicare Annual/Subsequent preventive examination.  Visit Complete: Virtual I connected with  [name] on 10/15/23 by a audio enabled telemedicine application and verified that I am speaking with the correct person using two identifiers.  Patient Location: Home  Provider Location: Office/Clinic  I discussed the limitations of evaluation and management by telemedicine. The patient expressed understanding and agreed to proceed.  Vital Signs: Because this visit was a virtual/telehealth visit, some criteria may be missing or patient reported. Any vitals not documented were not able to be obtained and vitals that have been documented are patient reported.  Patient Medicare AWV questionnaire was completed by the patient on 10/15/23; I have confirmed that all information answered by patient is correct and no changes since this date.  Cardiac Risk Factors include: advanced age (>83men, >67 women);diabetes mellitus;dyslipidemia;obesity (BMI >30kg/m2);hypertension     Objective:    Today's Vitals   10/15/23 1429  Weight: 250 lb (113.4 kg)  Height: 5\' 7"  (1.702 m)   Body mass index is 39.16 kg/m.     10/15/2023    2:36 PM 08/23/2023    9:33 AM 01/17/2023    9:36 AM 10/06/2022   10:07 AM 06/08/2022    8:09 AM 04/26/2022    7:58 AM 12/16/2021    5:42 AM  Advanced Directives  Does Patient Have a Medical Advance Directive? No No No No No No No  Would patient like information on creating a medical advance directive?  No - Patient declined No - Patient declined  No - Patient declined No - Patient declined No - Patient declined    Current Medications (verified) Outpatient Encounter Medications as of 10/15/2023  Medication Sig   albuterol  (VENTOLIN  HFA) 108 (90 Base) MCG/ACT inhaler INHALE 2 PUFFS EVERY 4 HOURS (15 MINUTES APART) FOR ASTHMA RESCUE   allopurinol  (ZYLOPRIM ) 300 MG tablet Take 1 tablet (300 mg total) by mouth  daily to prevent gout.   ALPRAZolam  (XANAX ) 0.5 MG tablet Take 1 tab up to three times a day only if needed for severe anxiety or panic attack. Avoid daily use.   aspirin  EC 81 MG tablet Take 81 mg by mouth daily. Swallow whole.   calcium  carbonate (TUMS - DOSED IN MG ELEMENTAL CALCIUM ) 500 MG chewable tablet Chew 1 tablet by mouth daily as needed for indigestion or heartburn.   carbidopa -levodopa  (SINEMET  IR) 25-250 MG tablet Take 1 tablet by mouth 4 (four) times daily.   Cholecalciferol (VITAMIN D ) 50 MCG (2000 UT) tablet Take 2,000 Units by mouth 2 (two) times daily.   ezetimibe  (ZETIA ) 10 MG tablet Take 1 tablet (10 mg total) by mouth daily for cholesterol.   glipiZIDE  (GLUCOTROL ) 5 MG tablet Take 1/2 tab as needed for glucose above 150. (Patient taking differently: Take 2.5-5 mg by mouth See admin instructions. Take 2.5 mg as needed for glucose above 150-199, take 5 mg as needed for glucose 200 or higher)   glycopyrrolate  (ROBINUL ) 1 MG tablet Take 1 tablet (1 mg total) by mouth 2 (two) times daily as needed.   Magnesium  250 MG TABS Take 1 tablet (250 mg total) by mouth 2 (two) times daily with a meal. (Patient taking differently: Take 1 tablet by mouth daily.)   Menthol, Topical Analgesic, (BIOFREEZE EX) Apply 1 Application topically daily as needed (pain).   metFORMIN  (GLUCOPHAGE -XR) 500 MG 24 hr tablet Take 2 tablets (1,000 mg total) by mouth 2 (two) times  daily.   polyethylene glycol powder (GLYCOLAX /MIRALAX ) 17 GM/SCOOP powder Take 17 g by mouth every other day. Alternating days with senna   pramipexole  (MIRAPEX ) 1 MG tablet Take 1 tablet (1 mg total) by mouth 3 (three) times daily.   rosuvastatin  (CRESTOR ) 20 MG tablet Take 1 tablet (20 mg total) by mouth at bedtime.   Semaglutide , 1 MG/DOSE, (OZEMPIC , 1 MG/DOSE,) 4 MG/3ML SOPN Inject 1 mg into the skin once a week.   Semaglutide ,0.25 or 0.5MG /DOS, (OZEMPIC , 0.25 OR 0.5 MG/DOSE,) 2 MG/3ML SOPN Inject 0.25 mg into the skin once a week for  30 days, THEN 0.5 mg once a week.   senna (SENOKOT) 8.6 MG tablet Take 2 tablets by mouth every other day. Alternating days with miralax    sodium chloride  (OCEAN) 0.65 % SOLN nasal spray Place 1 spray into both nostrils as needed for congestion.   tamsulosin  (FLOMAX ) 0.4 MG CAPS capsule Take 1 capsule (0.4 mg total) by mouth daily.   valsartan -hydrochlorothiazide  (DIOVAN -HCT) 320-25 MG tablet Take 1 tablet by mouth daily for blood pressure   [DISCONTINUED] doxycycline  (VIBRA -TABS) 100 MG tablet Take 1 tablet (100 mg total) by mouth 2 (two) times daily.   Facility-Administered Encounter Medications as of 10/15/2023  Medication   polyethylene glycol powder (GLYCOLAX /MIRALAX ) container 255 g    Allergies (verified) Ciprofloxacin  and Ceftriaxone    History: Past Medical History:  Diagnosis Date   Adult BMI 50.0-59.9 kg/sq m    52.77   Allergy     Anal fissure    Anxiety    Asthma    Cancer (HCC)    kidney cancer   Depression    Diabetes mellitus without complication (HCC)    borderline   Elevated PSA 08/13/2019   H/O: gout    STABLE   Hepatic steatosis 10/01/2011   History of kidney stones    Hyperlipidemia    Hypertension    Kidney tumor    right upper kidney tumor, monitoring   OSA on CPAP    [facility] D[facility]   Parkinson disease (HCC) 01/04/2016   REM sleep behavior disorder 12/06/2016   Right ureteral stone    Sleep apnea    Ureteral calculus, right 06/12/2011   Vitamin D  deficiency    Weakness    Past Surgical History:  Procedure Laterality Date   ANAL FISSURE REPAIR  10/12/2000   COLONOSCOPY N/A 07/01/2012   Procedure: COLONOSCOPY;  Surgeon: Pietro Bridegroom, MD;  Location: [facility];  Service: Endoscopy;  Laterality: N/A;   COLONOSCOPY  11/23/2022   CYSTOSCOPY WITH RETROGRADE PYELOGRAM, URETEROSCOPY AND STENT PLACEMENT Bilateral 02/26/2013   Procedure: CYSTOSCOPY WITH RETROGRADE PYELOGRAM, URETEROSCOPY AND STENT PLACEMENT;  Surgeon: Osborn Blaze, MD;  Location: [facility];  Service: Urology;  Laterality: Bilateral;   HOLMIUM LASER APPLICATION Bilateral 02/26/2013   Procedure: HOLMIUM LASER APPLICATION;  Surgeon: Osborn Blaze, MD;  Location: [facility];  Service: Urology;  Laterality: Bilateral;   kidney stone removal     06/2011-stent placement   LEFT MEDIAL FEMORAL CONDYLE DEBRIDEMENT & DRILLING/ REMOVAL LOOSE BODY  09/15/2003   NASAL SEPTUM SURGERY  05/09/1983   ROBOT ASSISTED LAPAROSCOPIC NEPHRECTOMY Right 12/16/2021   Procedure: XI ROBOTIC ASSISTED LAPAROSCOPIC RADICAL NEPHRECTOMY;  Surgeon: Osborn Blaze, MD;  Location: [facility];  Service: Urology;  Laterality: Right;  3 HRS   STONE EXTRACTION WITH BASKET  06/12/2011   Procedure: STONE EXTRACTION WITH BASKET;  Surgeon: Mark C Ottelin, MD;  Location: [facility];  Service: Urology;  Laterality: Right;  Family History  Problem Relation Age of Onset   Heart disease Mother        Atrial fibrillation   Hypertension Mother    Hyperlipidemia Mother    Lung cancer Father        was a smoker   Cancer Father        lung   Hyperlipidemia Brother    Melanoma Brother    Heart disease Brother        Amyloid   Heart disease Maternal Aunt    Hyperlipidemia Maternal Aunt    Hypertension Maternal Aunt    Stroke Maternal Aunt    Heart disease Paternal Grandmother    Hyperlipidemia Paternal Grandmother    Parkinson's disease Neg Hx    Sleep apnea Neg Hx    Colon cancer Neg Hx    Rectal cancer Neg Hx    Stomach cancer Neg Hx    Social History   Socioeconomic History   Marital status: Married    Spouse name: Dawn   Number of children: 0   Years of education: Not on file   Highest education level: Some college, no degree  Occupational History    Comment: retired  Tobacco Use   Smoking status: Former    Types: Cigars   Smokeless tobacco: Never  Vaping Use   Vaping status: Never Used  Substance and Sexual Activity   Alcohol use: No   Drug use: No    Comment: QUIT SMOKING " POT" 40  YRS AGO   Sexual activity: Yes    Partners: Female  Other Topics Concern   Not on file  Social History Narrative   Right-handed.   No caffeine use.   Lives at home with his wife.   Social Drivers of Corporate investment banker Strain: Low Risk  (08/02/2023)   Overall Financial Resource Strain (CARDIA)    Difficulty of Paying Living Expenses: Not hard at all  Food Insecurity: No Food Insecurity (08/02/2023)   Hunger Vital Sign    Worried About Running Out of Food in the Last Year: Never true    Ran Out of Food in the Last Year: Never true  Transportation Needs: No Transportation Needs (08/02/2023)   PRAPARE - Administrator, Civil Service (Medical): No    Lack of Transportation (Non-Medical): No  Physical Activity: Insufficiently Active (08/02/2023)   Exercise Vital Sign    Days of Exercise per Week: 2 days    Minutes of Exercise per Session: 20 min  Stress: No Stress Concern Present (08/02/2023)   [employer] of Occupational Health - Occupational Stress Questionnaire    Feeling of Stress : Only a little  Social Connections: Socially Integrated (08/02/2023)   Social Connection and Isolation Panel [NHANES]    Frequency of Communication with Friends and Family: More than three times a week    Frequency of Social Gatherings with Friends and Family: Twice a week    Attends Religious Services: More than 4 times per year    Active Member of [employer] or Organizations: Yes    Attends Engineer, structural: More than 4 times per year    Marital Status: Married    Tobacco Counseling Counseling given: Not Answered   Clinical Intake:  Pre-visit preparation completed: Yes  Pain : No/denies pain     BMI - recorded: 39.16 Nutritional Status: BMI > 30  Obese Nutritional Risks: None Diabetes: Yes CBG done?: No Did pt. bring in CBG monitor from home?: No  How often do you need to have someone help you when you read instructions, pamphlets, or other written  materials from your doctor or pharmacy?: 1 - Never What is the last grade level you completed in school?: 12th grade  Interpreter Needed?: No  Information entered by :: Fredia Janus, CMA   Activities of Daily Living    10/15/2023    2:34 PM 01/09/2023   11:43 PM  In your present state of health, do you have any difficulty performing the following activities:  Hearing? 0 0  Vision? 0 0  Difficulty concentrating or making decisions? 0 0  Walking or climbing stairs? 1 0  Dressing or bathing? 0 0  Doing errands, shopping? 0 0  Preparing Food and eating ? N   Using the Toilet? N   In the past six months, have you accidently leaked urine? N   Do you have problems with loss of bowel control? N   Managing your Medications? N   Managing your Finances? N   Housekeeping or managing your Housekeeping? N     Patient Care Team: Nonda Bays, FNP as PCP - General (Family Medicine) Thurmon Florida., MD (Ophthalmology) Secundino Dach Harvey Linen., MD as Consulting Physician (Urology) Pietro Bridegroom, MD (Inactive) as Consulting Physician (Gastroenterology) Monika Annas, [facility] (Inactive) as Pharmacist (Pharmacist)  Indicate any recent Medical Services you may have received from other than [facility] providers in the past year (date may be approximate).     Assessment:   This is a routine wellness examination for [place].  Hearing/Vision screen No results found.   Goals Addressed   None   Depression Screen    10/15/2023    2:33 PM 08/06/2023    8:07 AM 01/10/2023    8:49 PM 01/09/2023   11:43 PM 10/06/2022    9:57 AM 09/20/2021    4:34 PM 03/24/2021    3:36 PM  PHQ 2/9 Scores  PHQ - 2 Score 0 0 0 0 0 1 0  PHQ- 9 Score 1 0         Fall Risk    10/15/2023    2:32 PM 08/06/2023    8:07 AM 01/09/2023   11:43 PM 10/06/2022    9:58 AM 09/20/2021    4:33 PM  Fall Risk   Falls in the past year? 0 1 0 0 1  Number falls in past yr: 0 0   0  Injury with Fall? 0 0   1  Risk for fall due to : No Fall  Risks Impaired balance/gait No Fall Risks  History of fall(s);Impaired balance/gait  Follow up Falls evaluation completed Falls evaluation completed Falls prevention discussed;Education provided;Falls evaluation completed  Falls evaluation completed;Falls prevention discussed    MEDICARE RISK AT HOME: Medicare Risk at Home Any stairs in or around the home?: Yes If so, are there any without handrails?: No Home free of loose throw rugs in walkways, pet beds, electrical cords, etc?: Yes Adequate lighting in your home to reduce risk of falls?: Yes Life alert?: No Use of a cane, walker or w/c?: No Grab bars in the bathroom?: Yes Shower chair or bench in shower?: Yes Elevated toilet seat or a handicapped toilet?: Yes   Cognitive Function:        10/15/2023    2:38 PM  6CIT Screen  What Year? 0 points  What month? 0 points  What time? 0 points  Count back from 20 0 points  Months in reverse 0 points  Repeat phrase 0 points  Total Score 0 points    Immunizations Immunization History  Administered Date(s) Administered   Fluad Trivalent(High Dose 65+) 02/08/2023   Influenza Inj Mdck Quad With Preservative 02/22/2017, 01/21/2018, 02/06/2019   Influenza, High Dose Seasonal PF 02/18/2020, 02/10/2021, 02/07/2022   Influenza-Unspecified 02/03/2013, 02/05/2014   PFIZER Comirnaty (Gray Top)Covid-19 Tri-Sucrose Vaccine 08/17/2020, 02/17/2022   PFIZER(Purple Top)SARS-COV-2 Vaccination 06/14/2019, 07/09/2019, 03/06/2020   Pfizer Covid-19 Vaccine Bivalent Booster 65yrs & up 01/27/2021   Pfizer(Comirnaty )Fall Seasonal Vaccine 12 years and older 02/15/2022, 01/18/2023   Pneumococcal Conjugate-13 02/18/2020   Pneumococcal Polysaccharide-23 03/24/2021   Pneumococcal-Unspecified 11/05/2012   Respiratory Syncytial Virus Vaccine ,Recomb Aduvanted(Arexvy ) 04/13/2022   Tdap 03/26/2015, 09/07/2021    TDAP status: Up to date  Flu Vaccine status: Up to date  Pneumococcal vaccine status: Up to  date  Covid-19 vaccine status: Completed vaccines  Qualifies for Shingles Vaccine? Yes   Zostavax completed No   Shingrix Completed?: No.    Education has been provided regarding the importance of this vaccine. Patient has been advised to call insurance company to determine out of pocket expense if they have not yet received this vaccine. Advised may also receive vaccine at local pharmacy or Health Dept. Verbalized acceptance and understanding.  Screening Tests Health Maintenance  Topic Date Due   Zoster Vaccines- Shingrix (1 of 2) Never done   COVID-19 Vaccine (8 - Pfizer risk 2024-25 season) 07/18/2023   OPHTHALMOLOGY EXAM  10/24/2023   INFLUENZA VACCINE  12/07/2023   HEMOGLOBIN A1C  02/05/2024   Diabetic kidney evaluation - Urine ACR  04/23/2024   Diabetic kidney evaluation - eGFR measurement  08/05/2024   FOOT EXAM  08/05/2024   Medicare Annual Wellness (AWV)  10/14/2024   DTaP/Tdap/Td (3 - Td or Tdap) 09/08/2031   Colonoscopy  11/22/2032   Pneumonia Vaccine 41+ Years old  Completed   Hepatitis C Screening  Completed   HPV VACCINES  Aged Out   Meningococcal B Vaccine  Aged Out    Health Maintenance  Health Maintenance Due  Topic Date Due   Zoster Vaccines- Shingrix (1 of 2) Never done   COVID-19 Vaccine (8 - Pfizer risk 2024-25 season) 07/18/2023    Colorectal cancer screening: No longer required.   Lung Cancer Screening: (Low Dose CT Chest recommended if Age 46-80 years, 20 pack-year currently smoking OR have quit w/in 15years.) does not qualify.    Additional Screening:  Hepatitis C Screening: does not qualify  Vision Screening: Recommended annual ophthalmology exams for early detection of glaucoma and other disorders of the eye. Is the patient up to date with their annual eye exam?  Yes  Who is the provider or what is the name of the office in which the patient attends annual eye exams? [facility] If pt is not established with a provider, would they  like to be referred to a provider to establish care? No .   Dental Screening: Recommended annual dental exams for proper oral hygiene  Diabetic Foot Exam: Diabetic Foot Exam: Completed 04/2023     Plan:     I have personally reviewed and noted the following in the patient's chart:   Medical and social history Use of alcohol, tobacco or illicit drugs  Current medications and supplements including opioid prescriptions.  Functional ability and status Nutritional status Physical activity Advanced directives List of other physicians Hospitalizations, surgeries, and ER visits in previous 12 months Vitals Screenings to include cognitive, depression, and falls Referrals and appointments  In addition,  I have reviewed and discussed with patient certain preventive protocols, quality metrics, and best practice recommendations. A written personalized care plan for preventive services as well as general preventive health recommendations were provided to patient.     Mikal Wisman, Samson Croak, CMA   10/15/2023   After Visit Summary: (MyChart) Due to this being a telephonic visit, the after visit summary with patients personalized plan was offered to patient via MyChart   Nurse Notes: provided patient with information about setting up advanced directive in case he wanted to do that. Patient is not up to date on shingles vaccines so will provide patient with information about those as well in case he wants to get that updated. Patient is scheduled in July 2025 to have next eye exam.

## 2023-10-15 NOTE — Patient Instructions (Addendum)
 Todd Rice , Thank you for taking time to come for your Medicare Wellness Visit. I appreciate your ongoing commitment to your health goals. Please review the following plan we discussed and let me know if I can assist you in the future.   These are the goals we discussed:  Goals      Weight (lb) < 245 lb (111.1 kg)        This is a list of the screening recommended for you and due dates:  Health Maintenance  Topic Date Due   Zoster (Shingles) Vaccine (1 of 2) Never done   COVID-19 Vaccine (8 - Pfizer risk 2024-25 season) 07/18/2023   Eye exam for diabetics  10/24/2023   Flu Shot  12/07/2023   Hemoglobin A1C  02/05/2024   Yearly kidney health urinalysis for diabetes  04/23/2024   Yearly kidney function blood test for diabetes  08/05/2024   Complete foot exam   08/05/2024   Medicare Annual Wellness Visit  10/14/2024   DTaP/Tdap/Td vaccine (3 - Td or Tdap) 09/08/2031   Colon Cancer Screening  11/22/2032   Pneumonia Vaccine  Completed   Hepatitis C Screening  Completed   HPV Vaccine  Aged Out   Meningitis B Vaccine  Aged Out     Zoster Vaccine Injection What is this medication? ZOSTER VACCINE (ZOS ter vak SEEN) reduces the risk of herpes zoster (shingles). It does not treat shingles. It is still possible to get shingles after receiving the vaccine, but the symptoms may be less severe or not last as long. It works by helping your immune system learn how to fight off a future infection. This medicine may be used for other purposes; ask your health care provider or pharmacist if you have questions. COMMON BRAND NAME(S): [facility] What should I tell my care team before I take this medication? They need to know if you have any of these conditions: Cancer Immune system problems An unusual or allergic reaction to Zoster vaccine, other medications, foods, dyes, or preservatives Pregnant or trying to get pregnant Breastfeeding How should I use this medication? This vaccine is injected  into a muscle. It is given by your care team. This vaccine requires 2 doses to get the full benefit. Set a reminder for when your next dose is due. A copy of Vaccine Information Statements will be given before each vaccination. Be sure to read this information carefully each time. This sheet may change often. Talk to your care team about the use of this vaccine in children. This vaccine is not approved for use in children. Overdosage: If you think you have taken too much of this medicine contact a poison control center or emergency room at once. NOTE: This medicine is only for you. Do not share this medicine with others. What if I miss a dose? Keep appointments for follow-up (booster) doses. It is important not to miss your dose. Call your care team if you are unable to keep an appointment. What may interact with this medication? Medications that suppress your immune system Medications to treat cancer Steroid medications, such as prednisone  or cortisone This list may not describe all possible interactions. Give your health care provider a list of all the medicines, herbs, non-prescription drugs, or dietary supplements you use. Also tell them if you smoke, drink alcohol, or use illegal drugs. Some items may interact with your medicine. What should I watch for while using this medication? Visit your care team regularly. This vaccine, like all vaccines, may not  fully protect everyone. What side effects may I notice from receiving this medication? Side effects that you should report to your care team as soon as possible: Allergic reactions--skin rash, itching, hives, swelling of the face, lips, tongue, or throat Side effects that usually do not require medical attention (report these to your care team if they continue or are bothersome): Chills Fatigue Feeling faint or lightheaded Fever Headache Muscle pain Pain, redness, or irritation at injection site This list may not describe all possible  side effects. Call your doctor for medical advice about side effects. You may report side effects to FDA at 1-800-FDA-1088. Where should I keep my medication? This vaccine is only given by your care team. It will not be stored at home. NOTE: This sheet is a summary. It may not cover all possible information. If you have questions about this medicine, talk to your doctor, pharmacist, or health care provider.  2024 Elsevier/Gold Standard (2021-10-13 00:00:00)   Advance Directive  Advance directives are legal papers that state your wishes about health care decisions. They let your wishes be known to family, friends, and health care providers if you become unable to speak for yourself.  You should write these papers out over time rather than all at once. They can be changed and updated at any time. The types of advance directives include: Medical power of attorney (POA). Living wills. Do not resuscitate (DNR) or do not attempt resuscitation (DNAR) orders. What are a health care proxy and medical POA? A health care proxy is also called a health care agent. It's a person you choose to make medical decisions for you when you can't make them for yourself. In most cases, a proxy is a trusted friend or family member. A medical POA is legal paperwork that names your proxy. It may need to be: Signed. Notarized. Dated. Copied. Witnessed. Added to your medical record. You may also want to choose someone to handle your money if you can't do so. This is called a durable POA for finances. It's separate from a medical POA. You may choose your health care proxy or someone else to act as your agent in money matters. If you don't have a proxy, or if the proxy may not be acting in your best interest, a court may choose a guardian to act on your behalf. What is a living will? A living will is legal paperwork that states your wishes about medical care. Providers should keep a copy of it in your medical record.  You may want to give a copy to family members or friends. You can also keep a card in your wallet to let loved ones know you have a living will and where they can find it. A living will may be used if: You're very sick with something that will end your life. You become disabled. You can't make decisions or speak for yourself. Your living will should include whether: To use or not use life support equipment. This may include machines to filter your blood or to help you breathe. You want a DNR or DNAR order. This tells providers not to use CPR if your heart or breathing stops. To use or not use tube feeding. You want to be given foods and fluids. You want a type of comfort care called palliative care. This may be given when the goal for treatment becomes comfort rather than a cure. You want to donate your organs and tissues. A living will doesn't say what to do with  your money and property if you pass away. What is a DNR or DNAR? A DNR or DNAR order is a request not to have CPR. If you don't have one of these orders, a provider will try to help you if your heart stops or you stop breathing.  If you plan to have surgery, talk with your provider about your DNR or DNAR order. What happens if I don't have an advance directive? Each state has its own laws about advance directives. Some states assign family decision makers to act on your behalf if you don't have an advance directive.  Check with your provider, attorney, or state representative about the laws in your state. Where to find more information Each state has its own laws about advance directives. You can look up these laws at: https://rodriguez-phillips.com/ This information is not intended to replace advice given to you by your health care provider. Make sure you discuss any questions you have with your health care provider. Document Revised: 09/11/2022 Document Reviewed: 09/11/2022 Elsevier Patient Education  2024 [employer].

## 2023-10-16 ENCOUNTER — Telehealth: Payer: Self-pay

## 2023-10-16 NOTE — Progress Notes (Signed)
 Complex Care Management Note  Care Guide Note 10/16/2023 Name: Todd Rice MRN: [id number] DOB: 1955-08-23  Todd Rice is a 68 y.o. year old male who sees Caudle, Arcola Kocher, FNP for primary care. I reached out to Todd Rice by phone today to offer complex care management services.  Todd Rice was given information about Complex Care Management services today including:   The Complex Care Management services include support from the care team which includes your Nurse Care Manager, Clinical Social Worker, or Pharmacist.  The Complex Care Management team is here to help remove barriers to the health concerns and goals most important to you. Complex Care Management services are voluntary, and the patient may decline or stop services at any time by request to their care team member.   Complex Care Management Consent Status: Patient agreed to services and verbal consent obtained.   Follow up plan:  Telephone appointment with complex care management team member scheduled for:  10/23/23 at 11:00 a.m.   Encounter Outcome:  Patient Scheduled  [facility]  [facility], [facility] Care Management Assistant Direct Dial: [phone]  Fax: [phone]

## 2023-10-17 NOTE — Therapy (Signed)
 OUTPATIENT OCCUPATIONAL THERAPY [facility]  Patient Name: Todd Rice MRN: [id number] DOB:11/17/1955, 68 y.o., male Today's Date: 10/18/2023  PCP: Nonda Bays, FNP REFERRING PROVIDER: Clem Currier, NP  END OF SESSION:  OT End of Session - 10/18/23 0803     Visit Number 4    Number of Visits 17    Date for OT Re-Evaluation 12/13/23    Authorization Type [employer] Medicare, follows Medicare guidelines,    Authorization Time Period 16 visits approved 09/28/23-12/14/23    Authorization - Visit Number 4    Authorization - Number of Visits 10    Progress Note Due on Visit 10    OT Start Time 0802    OT Stop Time 0846    OT Time Calculation (min) 44 min    Activity Tolerance Patient tolerated treatment well    Behavior During Therapy [facility] for tasks assessed/performed              Past Medical History:  Diagnosis Date   Adult BMI 50.0-59.9 kg/sq m    52.77   Allergy     Anal fissure    Anxiety    Asthma    Cancer (HCC)    kidney cancer   Depression    Diabetes mellitus without complication (HCC)    borderline   Elevated PSA 08/13/2019   H/O: gout    STABLE   Hepatic steatosis 10/01/2011   History of kidney stones    Hyperlipidemia    Hypertension    Kidney tumor    right upper kidney tumor, monitoring   OSA on CPAP    [employer] [facility]   Parkinson disease (HCC) 01/04/2016   REM sleep behavior disorder 12/06/2016   Right ureteral stone    Sleep apnea    Ureteral calculus, right 06/12/2011   Vitamin D  deficiency    Weakness    Past Surgical History:  Procedure Laterality Date   ANAL FISSURE REPAIR  10/12/2000   COLONOSCOPY N/A 07/01/2012   Procedure: COLONOSCOPY;  Surgeon: Pietro Bridegroom, MD;  Location: [facility];  Service: Endoscopy;  Laterality: N/A;   COLONOSCOPY  11/23/2022   CYSTOSCOPY WITH RETROGRADE PYELOGRAM, URETEROSCOPY AND STENT PLACEMENT Bilateral 02/26/2013   Procedure: CYSTOSCOPY WITH RETROGRADE PYELOGRAM, URETEROSCOPY AND  STENT PLACEMENT;  Surgeon: Osborn Blaze, MD;  Location: [facility];  Service: Urology;  Laterality: Bilateral;   HOLMIUM LASER APPLICATION Bilateral 02/26/2013   Procedure: HOLMIUM LASER APPLICATION;  Surgeon: Osborn Blaze, MD;  Location: [facility];  Service: Urology;  Laterality: Bilateral;   kidney stone removal     06/2011-stent placement   LEFT MEDIAL FEMORAL CONDYLE DEBRIDEMENT & DRILLING/ REMOVAL LOOSE BODY  09/15/2003   NASAL SEPTUM SURGERY  05/09/1983   ROBOT ASSISTED LAPAROSCOPIC NEPHRECTOMY Right 12/16/2021   Procedure: XI ROBOTIC ASSISTED LAPAROSCOPIC RADICAL NEPHRECTOMY;  Surgeon: Osborn Blaze, MD;  Location: [facility];  Service: Urology;  Laterality: Right;  3 HRS   STONE EXTRACTION WITH BASKET  06/12/2011   Procedure: STONE EXTRACTION WITH BASKET;  Surgeon: Mark C Ottelin, MD;  Location: [facility];  Service: Urology;  Laterality: Right;   Patient Active Problem List   Diagnosis Date Noted   GAD (generalized anxiety disorder) 08/06/2023   Sialorrhea 12/28/2022   Renal mass 12/16/2021   Constipation 03/24/2021   Gout 03/23/2021   Primary renal papillary carcinoma, 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   Hepatic steatosis 02/18/2020   Major depression in remission (HCC) 11/11/2019   Central sleep apnea due to Cheyne-Stokes respiration 08/31/2019  Hyperlipidemia associated with type 2 diabetes mellitus (HCC) 07/09/2019   Aortic atherosclerosis (HCC) by CXR on 06/10/2018 06/10/2018   Type 2 diabetes mellitus with stage 3a chronic kidney disease, without long-term current use of insulin  (HCC) 01/21/2018   OSA treated with BiPAP 01/03/2017   REM sleep behavior disorder 12/06/2016   CKD stage 3 due to type 2 diabetes mellitus (HCC) 10/23/2016   Medication management 10/23/2016   Vitamin D  deficiency 10/23/2016   Parkinson's disease (HCC) 01/04/2016   Seasonal and perennial allergic rhinitis 06/21/2014   Asthma, mild intermittent, well-controlled 06/21/2014   Pseudomonas  aeruginosa colonization 10/08/2012   CAD (coronary artery disease) 11/15/2011   Essential hypertension 11/15/2011   Morbid obesity with BMI of 40.0-44.9, adult (HCC) 11/15/2011    ONSET DATE:  09/06/23 (referral date)  REFERRING DIAG: G20.A1 (ICD-10-CM) - Parkinson's disease without dyskinesia or fluctuating manifestations (HCC)  THERAPY DIAG:  Other symptoms and signs involving the nervous system  Other symptoms and signs involving the musculoskeletal system  Other lack of coordination  Abnormal posture  Tremor  Unsteadiness on feet  Rationale for Evaluation and Treatment: Rehabilitation  SUBJECTIVE:   SUBJECTIVE STATEMENT: See Speech therapy today.  Has been having pain and they are doing tests (related to kidney).  No pain today.   Pt accompanied by: self  PERTINENT HISTORY:  Pt returns today for 6-61month re-evaluation as recommended when last discharged from occupational therapy 06/26/22.  Pt reports that his PD medication was incr to 4x day in June due to wearing off periods.  PMH includes:  CAD, HTN, HDL, obesity, asthma, DM, CKD, vitamin D  deficiency, REM sleep disorder, OSA, aortic atherosclerosis, hx of gout, GERD, hx of anxiety, tumor on R kidney s/p nephrectomy ([place]--surgery only)  PRECAUTIONS: Fall  WEIGHT BEARING RESTRICTIONS: No  PAIN:  Are you having pain? No  FALLS: Has patient fallen in last 6 months? Yes. Number of falls 1 and almost 6 months ago, fell out of bed, now has adjustable bed--recently completed PT to address  LIVING ENVIRONMENT: Lives with: lives with their family and lives with their spouse Lives in: House/apartment  PLOF: Independent, Vocation/Vocational requirements: retired, and Leisure: walking, HEP  PATIENT GOALS:  improve hands  OBJECTIVE:   HAND DOMINANCE: Right  ADLs:  pt reports that some days are tougher than others, has bad Parkinsons days about 2x/wk.  Some difficulty getting items out of pocket Transfers/ambulation  related to ADLs:  min difficulty with bed mobility (mornings)--1 fall, but now has adjustable bed to go lower.  Min difficulty with sofa.  Eating: occasional assist/difficulty with cutting meat, difficulty opening unsealed bottle at times (use both hands and gripper/towel or wife opens new ones) Grooming: mod I, wife assists near ears (but better if shaves later in the morning) UB Dressing: mod I, slow, doesn't wear buttons LB Dressing: mod I, slow, has long handled shoe horn, holds on for balance Toileting: mod I Bathing: difficulty with balance in shower with eyes closed Tub Shower transfers: mod I (no grab bar/seat)  IADLs: Shopping: mod I Light housekeeping: wife does most, mod I for tasks performed, been doing some yard work  Meal Prep: wife does most, mod I for tasks performed Community mobility:  driving--speeds up some unintentionally, stays to side more, takes more effort/concentration but works to focus more, does not drive on bad days Medication management: pt has been forgetting to take meds more (11am and 3pm dose) Financial management: wife has always performed  Handwriting: 100% legible and Moderate  micrographia, printing for 3 sentences.     MOBILITY STATUS: Independent, Hx of falls, Freezing, difficulty with turns, start hesitation, and difficulty carrying objections with ambulation  Currently in PT, has noticed changes in balance.  Using 1 walking pole at times.  POSTURE COMMENTS:  rounded shoulders  ACTIVITY TOLERANCE: Activity tolerance: denies difficulty  FUNCTIONAL OUTCOME MEASURES: Standing functional reach: Right: 10 inches; Left: 13 inches Fastening/unfastening 3 buttons: 65.63sec  Physical performance test: PPT#2 (simulated eating) NT due to time constraints & PPT#4 (donning/doffing jacket): 35.89sec PPT#2 Simulated eating in 14.13sec.  (09/28/23) Upper Extremity Functional Scale 66/80.   (09/28/23)  COORDINATION: 9 Hole Peg test: Right: 42.77 sec; Left:  41.04sec with with trunk/shoulder compensation noted with each hand (decr in-hand manipulation) Box and Blocks:  Right 46 blocks, Left 44 blocks, difficulty with hitting divider/bradykinesia with BUEs Tremors: Resting, Right, and Left, mild  UE ROM:  Bilateral shoulder flex R-135* with elbow ext -5*, L-135* with elbow ext WNL, mild decr supination bilaterally R>L and mild decr R MP ext  Otherwise bilateral UE ROM grossly WNL   UE MMT:   Not tested  SENSATION: Not tested    MUSCLE TONE: RUE: Mild, Moderate, and Rigidity and LUE: Within functional limits  COGNITION: Overall cognitive status: bradyphrenia with word recall, pt denies recent changes  VISION:  no changes per pt  OBSERVATIONS: Bradykinesia   TODAY'S TREATMENT:                                                               10/18/23:  PWR! Hands (up, step) with min v.c. for incr movement amplitude.  Continued review of previous coordination HEP with each hand (and updated/provided min-mod verbal, tactile cueing for large amplitude movements):  Isolated IP flexion/extension, individual finger walking, and rotating 2 balls in each hand.   Checked STGs and some LTGs--see goals section below for updates    Reviewed large amplitude movement strategies for donning/doffing jacket and fastening/unfastening buttons with min-mod cueing and practice.  Pt verbalized understanding and returned demo.  Tying shoe on tabletop (pt forgot his today).  Pt able to tie in 23sec, but primarily used L hand.    Flipping cylinder pegs in hand to manipulate with min-mod verbal, visual cueing for large amplitude and avoid shoulder compensation.     10/05/23:   PWR! Hands (up) x15 with min v.c. for incr movement amplitude.    Continued review of previous coordination HEP with each hand (and updated/provided min-mod verbal and visual cueing for large amplitude movements including use of obstacles/visual targets and PWR! hands):  Isolated IP  flexion/extension, individual finger lifts--matching L and R hand height, finger walking (using pencil for targets), thumb circles (with visual targets), picking up coins with each finger and thumb, stacking coins, manipulating coins in hand to place in coin bank, rotating ball in each hand (needed visual targets with L hand).  Pt instructed in how to incorporate visual targets in HEP at home to help incr movement amplitude.      09/28/23:   PWR! Hands (basic 4) x10 each with min cueing for large amplitude movements (verbal and visual).  Rolling ball up wall for bilateral end range shoulder flex and posture with wt. Bearing through hands on ball at end-range for proprioceptive input with  min cueing.  Reviewed the following from coordination HEP (from previous):  flipping cards, dealing cards with thumb, sliding cards across table with each hand with finger ext and min cueing for incr movement amplitude with each hand  Also assessed PPT#2 Simulated eating in 14.13sec.   Upper Extremity Functional Scale 66/80.                                                                   09/14/23:  Evaluation completed today.     PATIENT EDUCATION: Education details: See above regarding large amplitude movements and HEP and strategy review Person educated: Patient Education method: Explanation, Demonstration, Tactile cues, and Verbal cues Education comprehension: verbalized understanding, returned demonstration, and verbal cues required  HOME EXERCISE PROGRAM: 09/28/23 & 5/30:  Reviewed PWR! Hands (basic 4), Reviewed finger ROM HEP, and Began reviewing previous coordination HEP--see above 10/18/23:  Reviewed large amplitude movement strategies for donning/doffing jacket and fastening buttons   GOALS: Goals reviewed with patient? Yes  SHORT TERM GOALS: Target date: 10/13/23  Pt will be independent with updated HEP. Goal status: Ongoing/partially met.  Began reviewing, but needs min cueing for large  amplitude and would benefit from further review 10/18/23  2.  Pt will improve coordination for ADLs/IADLs as shown by improving time on 9-hole peg test by at least 5 sec bilaterally.   Baseline:  Right: 42.77 sec; Left: 41.04sec Goal status: Not met/ongoing.  10/18/23  R-52.48, 35.89sec.  L-66.09, 42.71sec  3.  Pt will improve bilateral overhead reach as shown by demonstrating at least 140* shoulder flex with each UE. Baseline: 135* Goal status: Met.  R-145*, L-140*  4.  Pt will improve ability to don/doff jacket and demo decr bradykinesia as shown by improving PPT#4 by at least 4sec. Baseline:  35.89sec Goal status: Met. 10/18/23--22.14sec   LONG TERM GOALS: Target date: 12/13/23  Pt will verbalize understanding of updated adaptive strategies for ADLs/IADLs to incr independence, safety, and ease. Goal status: Ongoing 10/18/23  2.  Pt will be able to write at least 3 sentences with only min decr in size, 100% legibility. Baseline: mod micrographia Goal status: Ongoing 10/18/23  3.  Pt will improve bilateral hand coordination for ADLs as shown by fastening/unfastening 3 buttons in less than 55sec. Baseline:  65.63sec Goal status: 10/18/23  Met. 42.94sec  4.  Pt will improve ability to don/doff jacket and demo decr bradykinesia as shown by improving PPT#4 by at least 8sec. Baseline:  35.89sec Goal status: Met. 10/18/23--22.14sec     ASSESSMENT:  CLINICAL IMPRESSION: Pt continues to respond well to cueing and repetition for large amplitude movements, but needs reinforcement and cueing to ensure calibration.  Pt benefits from visual, verbal, and tactile cueing to incr R hand movement amplitude.  Pt has met STG #3-4 and LTG #3,4 and is progressing towards remaining goals.    PERFORMANCE DEFICITS: in functional skills including ADLs, IADLs, coordination, dexterity, tone, ROM, strength, flexibility, Fine motor control, Gross motor control, mobility, balance, and UE functional use, cognitive  skills including perception and bradyphrenia, and psychosocial skills including environmental adaptation, habits, and routines and behaviors.   IMPAIRMENTS: are limiting patient from ADLs, IADLs, rest and sleep, leisure, and social participation.   COMORBIDITIES:  may have co-morbidities  that affects  occupational performance.   MODIFICATION OR ASSISTANCE TO COMPLETE EVALUATION: Min-Moderate modification of tasks or assist with assess necessary to complete an evaluation.  OT OCCUPATIONAL PROFILE AND HISTORY: Detailed assessment: Review of records and additional review of physical, cognitive, psychosocial history related to current functional performance.  CLINICAL DECISION MAKING: Moderate - several treatment options, min-mod task modification necessary  EVALUATION COMPLEXITY: Moderate    PLAN:  OT FREQUENCY: 1-2x/week  OT DURATION: 16 visits + eval over 12 weeks (due to scheduling purposes)  PLANNED INTERVENTIONS: 97535 self care/ADL training, [address] therapeutic exercise, 97530 therapeutic activity, 97112 neuromuscular re-education, 97140 manual therapy, 97035 ultrasound, 97018 paraffin, 60454 moist heat, 97010 cryotherapy, passive range of motion, balance training, functional mobility training, visual/perceptual remediation/compensation, energy conservation, patient/family education, and DME and/or AE instructions  RECOMMENDED OTHER SERVICES: none at this time, pt current with PT at [facility]  CONSULTED AND AGREED WITH PLAN OF CARE: Patient  PLAN FOR NEXT SESSION:  continue with coordination, large amplitude movements and ADL strategies, timed tying shoes--to bring tie shoes next session and to tie while shoe is on, review putty exercises for finger ext--to bring putty next session (pt forgot putty and tie shoes today), issue updated comprehensive hand/coordination HEP handout  Oneil Behney, OTR/L 10/18/2023, 9:14 AM

## 2023-10-18 ENCOUNTER — Ambulatory Visit: Admitting: Occupational Therapy

## 2023-10-18 ENCOUNTER — Ambulatory Visit: Payer: Medicare Other | Attending: Adult Health

## 2023-10-18 ENCOUNTER — Encounter: Payer: Self-pay | Admitting: Occupational Therapy

## 2023-10-18 ENCOUNTER — Telehealth: Payer: Self-pay | Admitting: Neurology

## 2023-10-18 ENCOUNTER — Other Ambulatory Visit: Payer: Self-pay

## 2023-10-18 ENCOUNTER — Ambulatory Visit: Payer: Medicare Other | Admitting: Physical Therapy

## 2023-10-18 DIAGNOSIS — R278 Other lack of coordination: Secondary | ICD-10-CM | POA: Insufficient documentation

## 2023-10-18 DIAGNOSIS — R29818 Other symptoms and signs involving the nervous system: Secondary | ICD-10-CM | POA: Diagnosis present

## 2023-10-18 DIAGNOSIS — R293 Abnormal posture: Secondary | ICD-10-CM

## 2023-10-18 DIAGNOSIS — R251 Tremor, unspecified: Secondary | ICD-10-CM | POA: Diagnosis present

## 2023-10-18 DIAGNOSIS — R2681 Unsteadiness on feet: Secondary | ICD-10-CM | POA: Diagnosis present

## 2023-10-18 DIAGNOSIS — R471 Dysarthria and anarthria: Secondary | ICD-10-CM | POA: Diagnosis present

## 2023-10-18 DIAGNOSIS — R29898 Other symptoms and signs involving the musculoskeletal system: Secondary | ICD-10-CM | POA: Diagnosis present

## 2023-10-18 NOTE — Therapy (Signed)
 [place] [facility] [address], [address] [place], [place], [address] Phone: [phone]   Fax:  [phone]  Patient Details  Name: Todd Rice MRN: [id number] Date of Birth: 1956-01-03 Referring Provider:  Durwood Gilmore, MD  Encounter Date: 10/18/2023  Speech Therapy Parkinson's Disease Screen   Decibel Level today: 70dB  (WNL=70-72 dB) with sound level meter 30cm away from pt's mouth. Pt's conversational volume has remained essentially the same since last treatment course  Pt does not report difficulty with swallowing, which does not warrant further evaluation  Pt does not endorse changes in cognition.   Pt does does not require speech therapy services at this time. Recommend ST screen in another 6-8 months.  Mithcell Schumpert, CCC-SLP 10/18/2023, 11:48 AM  [place] [facility] [address] [place], [place], [address] Phone: [phone]   Fax:  [phone]

## 2023-10-18 NOTE — Telephone Encounter (Signed)
 Todd Rice from [facility] called in regards to faxing form for Md to sign and send  sleep study results and Last office visit notes .   Todd Rice informed that he will fax paperwork over again and follow up Monday .  [facility]  Phone#[phone] Fax# [phone]

## 2023-10-18 NOTE — Therapy (Signed)
 OUTPATIENT OCCUPATIONAL THERAPY [facility]  Patient Name: Todd Rice MRN: [id number] DOB:07-13-1955, 68 y.o., male Today's Date: 10/19/2023  PCP: Nonda Bays, FNP REFERRING PROVIDER: Clem Currier, NP  END OF SESSION:  OT End of Session - 10/19/23 0800     Visit Number 5    Number of Visits 17    Date for OT Re-Evaluation 12/13/23    Authorization Type [employer] Medicare, follows Medicare guidelines,    Authorization Time Period 16 visits approved 09/28/23-12/14/23    Authorization - Number of Visits 5    Progress Note Due on Visit 10    OT Start Time 0801    OT Stop Time 0845    OT Time Calculation (min) 44 min    Activity Tolerance Patient tolerated treatment well    Behavior During Therapy [facility] for tasks assessed/performed               Past Medical History:  Diagnosis Date   Adult BMI 50.0-59.9 kg/sq m    52.77   Allergy     Anal fissure    Anxiety    Asthma    Cancer (HCC)    kidney cancer   Depression    Diabetes mellitus without complication (HCC)    borderline   Elevated PSA 08/13/2019   H/O: gout    STABLE   Hepatic steatosis 10/01/2011   History of kidney stones    Hyperlipidemia    Hypertension    Kidney tumor    right upper kidney tumor, monitoring   OSA on CPAP    [employer] [facility]   Parkinson disease (HCC) 01/04/2016   REM sleep behavior disorder 12/06/2016   Right ureteral stone    Sleep apnea    Ureteral calculus, right 06/12/2011   Vitamin D  deficiency    Weakness    Past Surgical History:  Procedure Laterality Date   ANAL FISSURE REPAIR  10/12/2000   COLONOSCOPY N/A 07/01/2012   Procedure: COLONOSCOPY;  Surgeon: Pietro Bridegroom, MD;  Location: [facility];  Service: Endoscopy;  Laterality: N/A;   COLONOSCOPY  11/23/2022   CYSTOSCOPY WITH RETROGRADE PYELOGRAM, URETEROSCOPY AND STENT PLACEMENT Bilateral 02/26/2013   Procedure: CYSTOSCOPY WITH RETROGRADE PYELOGRAM, URETEROSCOPY AND STENT PLACEMENT;  Surgeon: Osborn Blaze, MD;  Location: [facility];  Service: Urology;  Laterality: Bilateral;   HOLMIUM LASER APPLICATION Bilateral 02/26/2013   Procedure: HOLMIUM LASER APPLICATION;  Surgeon: Osborn Blaze, MD;  Location: [facility];  Service: Urology;  Laterality: Bilateral;   kidney stone removal     06/2011-stent placement   LEFT MEDIAL FEMORAL CONDYLE DEBRIDEMENT & DRILLING/ REMOVAL LOOSE BODY  09/15/2003   NASAL SEPTUM SURGERY  05/09/1983   ROBOT ASSISTED LAPAROSCOPIC NEPHRECTOMY Right 12/16/2021   Procedure: XI ROBOTIC ASSISTED LAPAROSCOPIC RADICAL NEPHRECTOMY;  Surgeon: Osborn Blaze, MD;  Location: [facility];  Service: Urology;  Laterality: Right;  3 HRS   STONE EXTRACTION WITH BASKET  06/12/2011   Procedure: STONE EXTRACTION WITH BASKET;  Surgeon: Mark C Ottelin, MD;  [facility];  Service: Urology;  Laterality: Right;   Patient Active Problem List   Diagnosis Date Noted   GAD (generalized anxiety disorder) 08/06/2023   Sialorrhea 12/28/2022   Renal mass 12/16/2021   Constipation 03/24/2021   Gout 03/23/2021   Primary renal papillary carcinoma, 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   Hepatic steatosis 02/18/2020   Major depression in remission (HCC) 11/11/2019   Central sleep apnea due to Cheyne-Stokes respiration 08/31/2019   Hyperlipidemia associated with type 2  diabetes mellitus (HCC) 07/09/2019   Aortic atherosclerosis (HCC) by CXR on 06/10/2018 06/10/2018   Type 2 diabetes mellitus with stage 3a chronic kidney disease, without long-term current use of insulin  (HCC) 01/21/2018   OSA treated with BiPAP 01/03/2017   REM sleep behavior disorder 12/06/2016   CKD stage 3 due to type 2 diabetes mellitus (HCC) 10/23/2016   Medication management 10/23/2016   Vitamin D  deficiency 10/23/2016   Parkinson's disease (HCC) 01/04/2016   Seasonal and perennial allergic rhinitis 06/21/2014   Asthma, mild intermittent, well-controlled 06/21/2014   Pseudomonas aeruginosa colonization 10/08/2012   CAD  (coronary artery disease) 11/15/2011   Essential hypertension 11/15/2011   Morbid obesity with BMI of 40.0-44.9, adult (HCC) 11/15/2011    ONSET DATE:  09/06/23 (referral date)  REFERRING DIAG: G20.A1 (ICD-10-CM) - Parkinson's disease without dyskinesia or fluctuating manifestations (HCC)  THERAPY DIAG:  Other symptoms and signs involving the nervous system  Other symptoms and signs involving the musculoskeletal system  Other lack of coordination  Abnormal posture  Tremor  Unsteadiness on feet  Rationale for Evaluation and Treatment: Rehabilitation  SUBJECTIVE:   SUBJECTIVE STATEMENT: No Speech therapy needed--screen/eval again in December.  Pt reports that wife had to tie shoes this morning  Pt accompanied by: self  PERTINENT HISTORY:  Pt returns today for 6-59month re-evaluation as recommended when last discharged from occupational therapy 06/26/22.  Pt reports that his PD medication was incr to 4x day in June due to wearing off periods.  PMH includes:  CAD, HTN, HDL, obesity, asthma, DM, CKD, vitamin D  deficiency, REM sleep disorder, OSA, aortic atherosclerosis, hx of gout, GERD, hx of anxiety, tumor on R kidney s/p nephrectomy ([place]--surgery only)  PRECAUTIONS: Fall  WEIGHT BEARING RESTRICTIONS: No  PAIN:  Are you having pain? No  FALLS: Has patient fallen in last 6 months? Yes. Number of falls 1 and almost 6 months ago, fell out of bed, now has adjustable bed--recently completed PT to address  LIVING ENVIRONMENT: Lives with: lives with their family and lives with their spouse Lives in: House/apartment  PLOF: Independent, Vocation/Vocational requirements: retired, and Leisure: walking, HEP  PATIENT GOALS:  improve hands  OBJECTIVE:   HAND DOMINANCE: Right  ADLs:  pt reports that some days are tougher than others, has bad Parkinsons days about 2x/wk.  Some difficulty getting items out of pocket Transfers/ambulation related to ADLs:  min difficulty with bed  mobility (mornings)--1 fall, but now has adjustable bed to go lower.  Min difficulty with sofa.  Eating: occasional assist/difficulty with cutting meat, difficulty opening unsealed bottle at times (use both hands and gripper/towel or wife opens new ones) Grooming: mod I, wife assists near ears (but better if shaves later in the morning) UB Dressing: mod I, slow, doesn't wear buttons LB Dressing: mod I, slow, has long handled shoe horn, holds on for balance Toileting: mod I Bathing: difficulty with balance in shower with eyes closed Tub Shower transfers: mod I (no grab bar/seat)  IADLs: Shopping: mod I Light housekeeping: wife does most, mod I for tasks performed, been doing some yard work  Meal Prep: wife does most, mod I for tasks performed Community mobility:  driving--speeds up some unintentionally, stays to side more, takes more effort/concentration but works to focus more, does not drive on bad days Medication management: pt has been forgetting to take meds more (11am and 3pm dose) Financial management: wife has always performed  Handwriting: 100% legible and Moderate micrographia, printing for 3 sentences.  MOBILITY STATUS: Independent, Hx of falls, Freezing, difficulty with turns, start hesitation, and difficulty carrying objections with ambulation  Currently in PT, has noticed changes in balance.  Using 1 walking pole at times.  POSTURE COMMENTS:  rounded shoulders  ACTIVITY TOLERANCE: Activity tolerance: denies difficulty  FUNCTIONAL OUTCOME MEASURES: Standing functional reach: Right: 10 inches; Left: 13 inches Fastening/unfastening 3 buttons: 65.63sec  Physical performance test: PPT#2 (simulated eating) NT due to time constraints & PPT#4 (donning/doffing jacket): 35.89sec PPT#2 Simulated eating in 14.13sec.  (09/28/23) Upper Extremity Functional Scale 66/80.   (09/28/23)  COORDINATION: 9 Hole Peg test: Right: 42.77 sec; Left: 41.04sec with with trunk/shoulder compensation  noted with each hand (decr in-hand manipulation) Box and Blocks:  Right 46 blocks, Left 44 blocks, difficulty with hitting divider/bradykinesia with BUEs Tremors: Resting, Right, and Left, mild  UE ROM:  Bilateral shoulder flex R-135* with elbow ext -5*, L-135* with elbow ext WNL, mild decr supination bilaterally R>L and mild decr R MP ext  Otherwise bilateral UE ROM grossly WNL   UE MMT:   Not tested  SENSATION: Not tested    MUSCLE TONE: RUE: Mild, Moderate, and Rigidity and LUE: Within functional limits  COGNITION: Overall cognitive status: bradyphrenia with word recall, pt denies recent changes  VISION:  no changes per pt  OBSERVATIONS: Bradykinesia   TODAY'S TREATMENT:                                                               10/19/23:   Tying shoes in standing with foot propped in 44sec.  Completed review of hand HEP and performed the following with both hands with min-mod cueing for incr movement amplitude:  PWR! Hands (up, step), thumb circles, putty exercises (gross finger ext/abduction, finger ext, rolling and individual finger pinch with PWR! Hands), pulling bag/scarf into palm, tossing ball from one hand to the other and then tossing up and catching with the same hand.  Issued and reviewed comprehensive HEP--see pt instructions  Reviewed ways to decr risk of future complications related to PD.  Writing 4 sentences with 100% legibility with only min decr in size approx 10% of time (min v.c. for large amplitude).    10/18/23:  PWR! Hands (up, step) with min v.c. for incr movement amplitude.  Continued review of previous coordination HEP with each hand (and updated/provided min-mod verbal, tactile cueing for large amplitude movements):  Isolated IP flexion/extension, individual finger walking, and rotating 2 balls in each hand.   Checked STGs and some LTGs--see goals section below for updates    Reviewed large amplitude movement strategies for donning/doffing  jacket and fastening/unfastening buttons with min-mod cueing and practice.  Pt verbalized understanding and returned demo.  Tying shoe on tabletop (pt forgot his today).  Pt able to tie in 23sec, but primarily used L hand.    Flipping cylinder pegs in hand to manipulate with min-mod verbal, visual cueing for large amplitude and avoid shoulder compensation.     10/05/23:   PWR! Hands (up) x15 with min v.c. for incr movement amplitude.    Continued review of previous coordination HEP with each hand (and updated/provided min-mod verbal and visual cueing for large amplitude movements including use of obstacles/visual targets and PWR! hands):  Isolated IP flexion/extension, individual finger lifts--matching L  and R hand height, finger walking (using pencil for targets), thumb circles (with visual targets), picking up coins with each finger and thumb, stacking coins, manipulating coins in hand to place in coin bank, rotating ball in each hand (needed visual targets with L hand).  Pt instructed in how to incorporate visual targets in HEP at home to help incr movement amplitude.      09/28/23:   PWR! Hands (basic 4) x10 each with min cueing for large amplitude movements (verbal and visual).  Rolling ball up wall for bilateral end range shoulder flex and posture with wt. Bearing through hands on ball at end-range for proprioceptive input with min cueing.  Reviewed the following from coordination HEP (from previous):  flipping cards, dealing cards with thumb, sliding cards across table with each hand with finger ext and min cueing for incr movement amplitude with each hand  Also assessed PPT#2 Simulated eating in 14.13sec.   Upper Extremity Functional Scale 66/80.                                                                   09/14/23:  Evaluation completed today.     PATIENT EDUCATION: Education details: Re-issued comprehensive HEP for coordination/hands and Ways to decr risk of future  complications--see pt instructions.  Person educated: Patient Education method: Explanation, Demonstration, Tactile cues, and Verbal cues Education comprehension: verbalized understanding, returned demonstration, and verbal cues required  HOME EXERCISE PROGRAM: 09/28/23 & 5/30:  Reviewed PWR! Hands (basic 4), Reviewed finger ROM HEP, and Began reviewing previous coordination HEP--see above 10/18/23:  Reviewed large amplitude movement strategies for donning/doffing jacket and fastening buttons 10/19/23:   Re-issued comprehensive HEP for coordination/hands and Ways to decr risk of future complications   GOALS: Goals reviewed with patient? Yes  SHORT TERM GOALS: Target date: 10/13/23  Pt will be independent with updated HEP. Goal status: Ongoing/partially met.  Began reviewing, but needs min cueing for large amplitude and would benefit from further review 10/18/23  2.  Pt will improve coordination for ADLs/IADLs as shown by improving time on 9-hole peg test by at least 5 sec bilaterally.   Baseline:  Right: 42.77 sec; Left: 41.04sec Goal status: Not met/ongoing (not consistent).  10/18/23  R-52.48, 35.89sec.  L-66.09, 42.71sec  3.  Pt will improve bilateral overhead reach as shown by demonstrating at least 140* shoulder flex with each UE. Baseline: 135* Goal status: Met.  R-145*, L-140*  4.  Pt will improve ability to don/doff jacket and demo decr bradykinesia as shown by improving PPT#4 by at least 4sec. Baseline:  35.89sec Goal status: Met. 10/18/23--22.14sec   LONG TERM GOALS: Target date: 12/13/23  Pt will verbalize understanding of updated adaptive strategies for ADLs/IADLs to incr independence, safety, and ease. Goal status: Ongoing 10/18/23  2.  Pt will be able to write at least 3 sentences with only min decr in size, 100% legibility. Baseline: mod micrographia Goal status: 10/19/23--ongoing 4 sentences with 100% legibility today with only min decr in size approx 10% (will reassess  additional session for consistency)  3.  Pt will improve bilateral hand coordination for ADLs as shown by fastening/unfastening 3 buttons in less than 55sec. Baseline:  65.63sec Goal status: 10/18/23  Met. 42.94sec  4.  Pt will improve  ability to don/doff jacket and demo decr bradykinesia as shown by improving PPT#4 by at least 8sec. Baseline:  35.89sec Goal status: Met. 10/18/23--22.14sec  5.  Pt will improve ability to tie shoe (while wearing) to complete in 80sec or less.  Baseline:  44sec  Goal status:  New/updated 10/19/23      ASSESSMENT:  CLINICAL IMPRESSION: Pt is progressing towards goals with improving coordination and movement amplitude.  Pt continues to demo difficulty with large amplitude individual finger movements and in-hand manipulation.  Added LTG #5.  Approaching LTG#2 (but will monitor for consistency).    PERFORMANCE DEFICITS: in functional skills including ADLs, IADLs, coordination, dexterity, tone, ROM, strength, flexibility, Fine motor control, Gross motor control, mobility, balance, and UE functional use, cognitive skills including perception and bradyphrenia, and psychosocial skills including environmental adaptation, habits, and routines and behaviors.   IMPAIRMENTS: are limiting patient from ADLs, IADLs, rest and sleep, leisure, and social participation.   COMORBIDITIES:  may have co-morbidities  that affects occupational performance.   MODIFICATION OR ASSISTANCE TO COMPLETE EVALUATION: Min-Moderate modification of tasks or assist with assess necessary to complete an evaluation.  OT OCCUPATIONAL PROFILE AND HISTORY: Detailed assessment: Review of records and additional review of physical, cognitive, psychosocial history related to current functional performance.  CLINICAL DECISION MAKING: Moderate - several treatment options, min-mod task modification necessary  EVALUATION COMPLEXITY: Moderate    PLAN:  OT FREQUENCY: 1-2x/week  OT DURATION: 16  visits + eval over 12 weeks (due to scheduling purposes)  PLANNED INTERVENTIONS: 97535 self care/ADL training, 60454 therapeutic exercise, 97530 therapeutic activity, 97112 neuromuscular re-education, 97140 manual therapy, 97035 ultrasound, 97018 paraffin, 09811 moist heat, 97010 cryotherapy, passive range of motion, balance training, functional mobility training, visual/perceptual remediation/compensation, energy conservation, patient/family education, and DME and/or AE instructions  RECOMMENDED OTHER SERVICES: none at this time, pt current with PT at [facility]  CONSULTED AND AGREED WITH PLAN OF CARE: Patient  PLAN FOR NEXT SESSION:  large amplitude movements, coordination/in-hand manipulation, continue with ADL strategies (writing, tying shoes, donning/doffing jacket)  Alianys Chacko, OTR/L 10/19/2023, 10:36 AM

## 2023-10-19 ENCOUNTER — Encounter: Payer: Self-pay | Admitting: Occupational Therapy

## 2023-10-19 ENCOUNTER — Ambulatory Visit: Admitting: Occupational Therapy

## 2023-10-19 DIAGNOSIS — R29818 Other symptoms and signs involving the nervous system: Secondary | ICD-10-CM

## 2023-10-19 DIAGNOSIS — R2681 Unsteadiness on feet: Secondary | ICD-10-CM

## 2023-10-19 DIAGNOSIS — R29898 Other symptoms and signs involving the musculoskeletal system: Secondary | ICD-10-CM

## 2023-10-19 DIAGNOSIS — R293 Abnormal posture: Secondary | ICD-10-CM

## 2023-10-19 DIAGNOSIS — R471 Dysarthria and anarthria: Secondary | ICD-10-CM | POA: Diagnosis not present

## 2023-10-19 DIAGNOSIS — R278 Other lack of coordination: Secondary | ICD-10-CM

## 2023-10-19 DIAGNOSIS — R251 Tremor, unspecified: Secondary | ICD-10-CM

## 2023-10-19 NOTE — Patient Instructions (Addendum)
 PWR! Hand Exercises  Then, start with elbows bent and hands closed:  PWR! Hands: Push hands out BIG. Elbows straight, wrists up, fingers open and spread apart BIG.   PWR! Step: Touch index finger to thumb while keeping other fingers straight. Flick fingers out BIG (thumb out/straighten fingers). Repeat with other fingers. (Step your thumb to each finger).  With arms stretched out in front of you (elbows straight), perform the following:  PWR! Rock:  Move wrists up and down [employer]! Twist: Twist palms up and down BIG  ** Make each movement big and deliberate so that you feel the movement.  Perform at least 10 repetitions 1x/day, but perform PWR! Hands throughout the day when you are having trouble using your hands (picking up/manipulating small objects, writing, eating, typing, sewing, buttoning, etc.).       Flexor Tendon Gliding (Active Hook Fist)   With fingers and knuckles straight, bend middle and tip joints. Do not bend large knuckles. Repeat 5-10 times. Do 1x/day  Finger Walk (Active to Counteract Ulnar Deviation    With palm flat on table and held steady, lift or slide fingers one by one toward thumb. Then walk fingers back one at a time the other way.  Push through palm.  5-10x, 1x/day   MP Extension (Active)    With palm on table, straighten fingers completely at large knuckles one at a time, and lift fingers off table. 5-10x, 1x/day    Thumb Circles    Move right thumb in a circle.  Move as BIG AS YOU CAN. Repeat 10 times each direction per session. Do 1x/day Hand Variation: Palm up, can perform in a cup              Coordination Exercises   Perform the following exercises for 20 minutes 1 times per day. Perform with both hand(s). Perform using big movements.  **Try to perform with both hands at the same time for extra challenge**     Flipping Cards: Place deck of cards on the table. Flip cards over by opening your hand big to grasp and then  turn your palm up big, opening hand fully to release.** Deal cards: Hold 1/2 or whole deck in your hand. Use thumb to push card off top of deck with one big push.** Flip card between each finger and back Place card on tabletop.  Then flick fingers (extend fingers) powerfully to slide card off table (can have chair/box below table to catch the cards).** Rotate ball with fingertips: Pick up with fingers/thumb and move as much as you can with each turn/movement (clockwise and counter-clockwise).** Toss ball from one hand to the other: Toss big/high.  Deliberately open with toss and deliberately close hand/squeeze ball after catch.** Toss ball in the air and catch with the same hand: Toss big/high.  Deliberately open with toss and deliberately close hand/squeeze ball after catch. Rotate 2 golf balls in your hand: Both directions if you can. Pick up coins and stack one at a time: Open hand big and pick up with big, intentional movements. Do not drag coin to the edge. (5-10 in a stack)** Pick up 5-10 coins one at a time and hold in palm. Then, move coins from palm to fingertips one at time and place in coin bank/container.** Practice writing: Slow down, write big, and focus on forming each letter.  Stop and stretch hand frequently/if writing gets smaller.   Hold end of small trash/produce bag in fingers and pull bag into  palm by extending fingers big**     Extension (Assistive Putty)   Roll putty back and forth, being sure to use all fingertips. Repeat 3 times. Do 1-2 sessions per day.   Then pinch with each finger and thumb, opening big after each pinch.    Finger and Thumb Extension (Resistive Putty)   With thumb and all fingers in center of putty donut, stretch out. Repeat 5-10 times. Do 1-2 sessions per day.  Can do with each finger as well.            FINGERS: Extension (Putty)    Open hand and fingers to flatten putty. 5-10 reps per set, 1 sets per day, 3 days per week        Ways to prevent future Parkinson's related complications:   1.   Exercise regularly.  Perform your therapy exercises and incorporate safe aerobic exercise when possible (swimming, stationary bike, arm bike, seated stepper)   2.   Focus on BIGGER movements during daily activities- really reach overhead, straighten elbows and extend fingers   3.   When dressing or reaching for your seatbelt make sure to use your body to assist by twisting and looking at where you are reaching while you reach--this can help to minimize stress on the shoulder and reduce the risk of a rotator cuff tear   4.   Anytime you reach or move shoulder, make sure you have good upright posture.   5.  When you reach for something overhead, make sure your thumb is facing up.  This is a better position for your shoulder.   6.  Swing your arms when you walk!  People with PD are at increased risk for frozen shoulder and swinging your arms can reduce this risk.   7.  Keep you feet apart when you are standing to allow you to have better balance and reach further (which can help with shoulder rigidity).  Also make sure your feet are apart when you are sitting before you stand up.

## 2023-10-22 NOTE — Telephone Encounter (Signed)
 Phone room: Please have them resend the forms they need to be completed.  Thanks,  Production assistant, radio

## 2023-10-22 NOTE — Telephone Encounter (Signed)
 Ryan from [facility] call to follow up on paperwork that suppose to get faxed back along with sleep study results and Last Office notes   [facility]  Phone#[phone] Fax# [phone]

## 2023-10-23 ENCOUNTER — Other Ambulatory Visit: Payer: Self-pay

## 2023-10-23 ENCOUNTER — Telehealth: Payer: Self-pay

## 2023-10-23 ENCOUNTER — Other Ambulatory Visit (HOSPITAL_BASED_OUTPATIENT_CLINIC_OR_DEPARTMENT_OTHER): Payer: Self-pay

## 2023-10-23 NOTE — Progress Notes (Signed)
 Care Coordination Call  I called [facility] at [facility] and spoke with the staff pharmacist, Fairy Homer, regarding whether the Ozempic  PAP could be sent to and stored at the pharmacy, per my discussion with the patient. Fairy Homer stated that it is not standard practice for PAP medications to be stored at the pharmacy for patient pickup. She will contact the PCP if there is anything the pharmacy can assist with, aside from storing the medication.  Alexandria Angel, PharmD Clinical Pharmacist Cell: [phone]

## 2023-10-23 NOTE — Progress Notes (Addendum)
 10/23/2023 Name: Todd Rice MRN: [id number] DOB: 1955-12-31  Chief Complaint  Patient presents with   Medication Assistance    Todd Rice is a 68 y.o. year old male who presented for a telephone visit.   They were referred to the pharmacist by their PCP for assistance in managing medication access, specifically Ozempic .    Subjective:  Care Team: Primary Care Provider: Nonda Bays, FNP ; Next Scheduled Visit: 11/06/2023   Medication Access/Adherence  Current Pharmacy:  [facility] [address] [place] [address] Phone: [phone] Fax: [phone]  [facility] - [place], [place] - [address] [place] [address] Phone: [phone] Fax: [phone]   Patient reports affordability concerns with their medications: No  Patient reports access/transportation concerns to their pharmacy: No  Patient reports adherence concerns with their medications:  No    Per patient, he was informed that the plan is to try to have Ozempic  PAP medications shipped to the [facility], stored, and given to patient from that pharmacy.    Diabetes:  Current medications: Ozempic  0.25mg  weekly (Tuesday, start 10/16/2023), metformin  XR 1000 mg twice daily, glipizide  2.5-5 mg PRN instructed BG readings (see medication reconciliation chart) but reports that he does not use glipizide  often. He reports that it has been two months since last used glipizide . He states that glipizide  quickly reduces his BG and he is afraid to cause hypoglycemia.  -- Patient reports tolerating Ozempic  well and denies GI issues -- Additionally, patient stated that he worries about glipizide  overcorrecting his BG when his BG is >150 mg/dL and sometimes does not take glipizide  even when it is indicated because he knows his body and my sugar will correct itself.   Current glucose  readings: FBG 110 (typically <120); pre-dinner 97-108 Typical BG 100-140 throughout the day (he reports that it rare to have a BG in the 140s)  and he rarely checks PPBG Using BGM meter; testing two times daily   Patient denies hypoglycemic s/sx including dizziness, shakiness, sweating. Patient denies hyperglycemic symptoms including polyuria, polydipsia, polyphagia, nocturia, neuropathy, blurred vision.  Current meal patterns: mostly red meat, smaller portions and eat slower to remain full, drinks lots of water  - Breakfast: apple, fruit   Current physical activity: walks 6 days/week; PT for Parkinson's Disease, golf once weekly   The ASCVD Risk score (Arnett DK, et al., 2019) failed to calculate for the following reasons:   The valid total cholesterol range is 130 to 320 mg/dL Patient is taking rosuvastatin  20 mg nightly   Objective:  Lab Results  Component Value Date   HGBA1C 7.1 (H) 08/06/2023    Lab Results  Component Value Date   CREATININE 1.75 (H) 08/06/2023   BUN 31 (H) 08/06/2023   NA 139 08/06/2023   K 4.6 08/06/2023   CL 99 08/06/2023   CO2 21 08/06/2023    Lab Results  Component Value Date   CHOL 103 04/24/2023   HDL 35 (L) 04/24/2023   LDLCALC 45 04/24/2023   TRIG 147 04/24/2023   CHOLHDL 2.9 04/24/2023    Medications Reviewed Today     Reviewed by Amedeo Bailiff, RPH (Pharmacist) on 10/23/23 at 1419  Med List Status: <None>   Medication Order Taking? Sig Documenting Provider Last Dose Status Informant  albuterol  (VENTOLIN  HFA) 108 (90 Base) MCG/ACT inhaler [id number] Yes INHALE 2 PUFFS EVERY 4 HOURS (15 MINUTES APART) FOR ASTHMA RESCUE Langley Pippin, NP  Active   allopurinol  (ZYLOPRIM ) 300 MG tablet [id number] Yes Take 1 tablet (300 mg total) by mouth daily to prevent gout. Jarold Merlin B, FNP  Active   ALPRAZolam  (XANAX ) 0.5 MG tablet [id number] Yes Take 1 tab up to three times a day only if needed for severe anxiety or panic attack. Avoid daily  use. Langley Pippin, NP  Active   aspirin  EC 81 MG tablet [id number] Yes Take 81 mg by mouth daily. Swallow whole. [provider]  Active   calcium  carbonate (TUMS - DOSED IN MG ELEMENTAL CALCIUM ) 500 MG chewable tablet [id number] Yes Chew 1 tablet by mouth daily as needed for indigestion or heartburn. [provider]  Active Self  carbidopa -levodopa  (SINEMET  IR) 25-250 MG tablet [id number] Yes Take 1 tablet by mouth 4 (four) times daily. Penumalli, Vikram R, MD  Active   Cholecalciferol (VITAMIN D ) 50 MCG (2000 UT) tablet [id number] Yes Take 2,000 Units by mouth 2 (two) times daily. [provider]  Active Self  ezetimibe  (ZETIA ) 10 MG tablet [id number] Yes Take 1 tablet (10 mg total) by mouth daily for cholesterol. Wilkinson, Dana E, FNP  Active   glipiZIDE  (GLUCOTROL ) 5 MG tablet [id number] Yes Take 1/2 tab as needed for glucose above 150.  Patient taking differently: Take 2.5-5 mg by mouth See admin instructions. Take 2.5 mg as needed for glucose above 150-199, take 5 mg as needed for glucose 200 or higher   [place] Bureau, NP  Active Self  glycopyrrolate  (ROBINUL ) 1 MG tablet [id number] Yes Take 1 tablet (1 mg total) by mouth 2 (two) times daily as needed. Phebe Brasil, MD  Active   Magnesium  250 MG TABS [id number] Yes Take 1 tablet (250 mg total) by mouth 2 (two) times daily with a meal. Bolton Bureau, NP  Active Self  Menthol, Topical Analgesic, (BIOFREEZE EX) [id number] Yes Apply 1 Application topically daily as needed (pain). [provider]  Active Self  metFORMIN  (GLUCOPHAGE -XR) 500 MG 24 hr tablet [id number] Yes Take 2 tablets (1,000 mg total) by mouth 2 (two) times daily. Nonda Bays, FNP  Active   polyethylene glycol powder (GLYCOLAX /MIRALAX ) 17 GM/SCOOP powder [id number] Yes Take 17 g by mouth every other day. Alternating days with senna  Patient taking differently: Take 17 g by mouth daily as needed for mild constipation. Alternating days with senna    [provider]  Active Self  polyethylene glycol powder (GLYCOLAX /MIRALAX ) container 255 g [id number]   Melody Spurling., MD  Active   pramipexole  (MIRAPEX ) 1 MG tablet [id number] Yes Take 1 tablet (1 mg total) by mouth 3 (three) times daily. Clem Currier, NP  Active   rosuvastatin  (CRESTOR ) 20 MG tablet [id number] Yes Take 1 tablet (20 mg total) by mouth at bedtime. Nonda Bays, FNP  Active   Semaglutide , 1 MG/DOSE, (OZEMPIC , 1 MG/DOSE,) 4 MG/3ML SOPN [id number]  Inject 1 mg into the skin once a week.  Patient not taking: Reported on 10/23/2023   Vangie Genet, MD  Active   Semaglutide ,0.25 or 0.5MG /DOS, (OZEMPIC , 0.25 OR 0.5 MG/DOSE,) 2 MG/3ML SOPN [id number] Yes Inject 0.25 mg into the skin once a week for 30 days, THEN 0.5 mg once a week. Nonda Bays, FNP  Active   senna (SENOKOT) 8.6 MG tablet [id number] Yes Take 2 tablets by mouth every other day. Alternating days with miralax  [provider]  Active Self  sodium chloride  (OCEAN) 0.65 % SOLN nasal spray [id number] Yes Place 1 spray into both nostrils as  needed for congestion. [provider]  Active Self  tamsulosin  (FLOMAX ) 0.4 MG CAPS capsule [id number] Yes Take 1 capsule (0.4 mg total) by mouth daily.   Active   valsartan -hydrochlorothiazide  (DIOVAN -HCT) 320-25 MG tablet [id number] Yes Take 1 tablet by mouth daily for blood pressure Wilkinson, Dana E, FNP  Active               Assessment/Plan:   Diabetes: - Currently uncontrolled per last hemoglobin A1c that slightly worsened from 7%. Patient report FBG is below goal but it is unclear PPBG. Discussed with patient on checking BG after dinner. Given patient has not used glipizide  in about two months, we discussed discontinuing therapy since he is not using the medication and BG remains fairly controlled on metformin ; additionally Ozempic  was recently started.  - Reviewed long term cardiovascular and renal outcomes of uncontrolled  blood sugar - Reviewed goal A1c, goal fasting, and goal 2 hour post prandial glucose - Reviewed diet: continue lower carbohydrate diet  - Reviewed lifestyle/exercise: continue frequent walks, golfing, etc.; 150 minutes/ week of moderate exercise  - Recommend to CONTINUE Ozempic  0.25 mg weekly and metformin  1000 mg twice daily. DISCONTINUE glipizide . Next Ozempic  dose increase is scheduled for 11/13/2023 to 0.5 mg weekly. As Ozempic  dose increase and patients FBG, PPBG, and hemoglobin A1c remains below goal, future consideration for decreasing metformin  if it is appropriate.  - Recommend to check glucose twice daily FBG and PPBG (instead of pre-dinner BG) and patient was agreeable  Medication Access:  Discussed with patient that I do not believe it is typical for PAP medication to be stored in a dispensing pharmacy and distributed to patients.  - Will collaborate with dispensing pharmacy on protocol or policy in place to have patient specific PAP medications stored in pharmacy and distributed to patient.  - If patient tolerates Ozempic  and remains on therapy for the next fill, it will be due in late September or early October.    Follow Up Plan: 4 weeks (telephone) with PharmD  Alexandria Angel, PharmD Clinical Pharmacist Cell: [phone]

## 2023-10-23 NOTE — Progress Notes (Signed)
   10/23/2023  Patient ID: Todd Rice, male   DOB: Dec 20, 1955, 68 y.o.   MRN: [id number]  Attempted to contact patient for medication management/review. Left HIPAA compliant message for patient to return my call at their convenience.   First attempt for patient outreach. Will follow up with patient later today.  Thank you for allowing pharmacy to be a part of this patient's care.  Alexandria Angel, PharmD Clinical Pharmacist Cell: [phone]

## 2023-10-25 ENCOUNTER — Other Ambulatory Visit (HOSPITAL_BASED_OUTPATIENT_CLINIC_OR_DEPARTMENT_OTHER): Payer: Self-pay

## 2023-10-25 MED ORDER — FOSFOMYCIN TROMETHAMINE 3 G PO PACK
3.0000 g | PACK | ORAL | 0 refills | Status: DC
  Filled 2023-10-25 – 2023-11-01 (×3): qty 3, 6d supply, fill #0

## 2023-10-29 ENCOUNTER — Other Ambulatory Visit (HOSPITAL_BASED_OUTPATIENT_CLINIC_OR_DEPARTMENT_OTHER): Payer: Self-pay

## 2023-10-29 ENCOUNTER — Telehealth: Payer: Self-pay

## 2023-10-29 ENCOUNTER — Ambulatory Visit: Payer: Medicare Other | Admitting: Internal Medicine

## 2023-10-29 NOTE — Telephone Encounter (Signed)
-----   Message from Thersia Bitters Caudle sent at 10/25/2023  8:12 PM EDT ----- Regarding: RE: D/c glipizide  Hi Seraphim Trow,,  Sorry I have been out of the office for the past few days. Yes, we can discontinue the glipizide . Thank you for getting in touch with Helayne and for updating me as well. ----- Message ----- From: Cleatus Dorcas SAUNDERS, [facility] Sent: 10/25/2023  11:15 AM EDT To: Thersia Bitters Stark, FNP Subject: D/c glipizide                                   Hi,   I hope your day is going well! I am following up regarding the message to discontinue glipizide , which had been used on a PRN basis. Mr. Leichter reports that he has not needed to take glipizide  for nearly two months.  Thanks,  Dorcas Cleatus, PharmD Clinical Pharmacist Cell: [phone]

## 2023-10-29 NOTE — Progress Notes (Signed)
   10/29/2023  Patient ID: Todd Rice, male   DOB: 1955-11-13, 68 y.o.   MRN: [id number]  Glipizide  discontinued

## 2023-10-30 ENCOUNTER — Other Ambulatory Visit (HOSPITAL_BASED_OUTPATIENT_CLINIC_OR_DEPARTMENT_OTHER): Payer: Self-pay

## 2023-10-30 ENCOUNTER — Ambulatory Visit: Payer: Medicare Other | Admitting: Adult Health

## 2023-11-01 ENCOUNTER — Other Ambulatory Visit (HOSPITAL_BASED_OUTPATIENT_CLINIC_OR_DEPARTMENT_OTHER): Payer: Self-pay

## 2023-11-01 NOTE — Therapy (Signed)
 OUTPATIENT OCCUPATIONAL THERAPY [facility]  Patient Name: Todd Rice MRN: [id number] DOB:[birth year], 68 y.o., male Today's Date: 11/02/2023  PCP: Thersia Schuyler Stark, FNP REFERRING PROVIDER: Sherryl Bouchard, NP  END OF SESSION:  OT End of Session - 11/02/23 0802     Visit Number 6    Number of Visits 17    Date for OT Re-Evaluation 12/13/23    Authorization Type [employer] Medicare, follows Medicare guidelines,    Authorization Time Period 16 visits approved 09/28/23-12/14/23    Authorization - Number of Visits 6    Progress Note Due on Visit 10    OT Start Time 0801    OT Stop Time 0845    OT Time Calculation (min) 44 min    Activity Tolerance Patient tolerated treatment well    Behavior During Therapy [facility] for tasks assessed/performed                Past Medical History:  Diagnosis Date   Adult BMI 50.0-59.9 kg/sq m    52.77   Allergy     Anal fissure    Anxiety    Asthma    Cancer (HCC)    kidney cancer   Depression    Diabetes mellitus without complication (HCC)    borderline   Elevated PSA 08/13/2019   H/O: gout    STABLE   Hepatic steatosis 10/01/2011   History of kidney stones    Hyperlipidemia    Hypertension    Kidney tumor    right upper kidney tumor, monitoring   OSA on CPAP    [employer] [facility]   Parkinson disease (HCC) 01/04/2016   REM sleep behavior disorder 12/06/2016   Right ureteral stone    Sleep apnea    Ureteral calculus, right 06/12/2011   Vitamin D  deficiency    Weakness    Past Surgical History:  Procedure Laterality Date   ANAL FISSURE REPAIR  10/12/2000   COLONOSCOPY N/A 07/01/2012   Procedure: COLONOSCOPY;  Surgeon: Princella CHRISTELLA Nida, MD;  Location: [facility];  Service: Endoscopy;  Laterality: N/A;   COLONOSCOPY  11/23/2022   CYSTOSCOPY WITH RETROGRADE PYELOGRAM, URETEROSCOPY AND STENT PLACEMENT Bilateral 02/26/2013   Procedure: CYSTOSCOPY WITH RETROGRADE PYELOGRAM, URETEROSCOPY AND STENT PLACEMENT;  Surgeon:  Ricardo Likens, MD;  Location: [facility];  Service: Urology;  Laterality: Bilateral;   HOLMIUM LASER APPLICATION Bilateral 02/26/2013   Procedure: HOLMIUM LASER APPLICATION;  Surgeon: Ricardo Likens, MD;  Location: [facility];  Service: Urology;  Laterality: Bilateral;   kidney stone removal     06/2011-stent placement   LEFT MEDIAL FEMORAL CONDYLE DEBRIDEMENT & DRILLING/ REMOVAL LOOSE BODY  09/15/2003   NASAL SEPTUM SURGERY  05/09/1983   ROBOT ASSISTED LAPAROSCOPIC NEPHRECTOMY Right 12/16/2021   Procedure: XI ROBOTIC ASSISTED LAPAROSCOPIC RADICAL NEPHRECTOMY;  Surgeon: Likens Ricardo, MD;  Location: [facility];  Service: Urology;  Laterality: Right;  3 HRS   STONE EXTRACTION WITH BASKET  06/12/2011   Procedure: STONE EXTRACTION WITH BASKET;  Surgeon: Mark C Ottelin, MD;  Location[facility];  Service: Urology;  Laterality: Right;   Patient Active Problem List   Diagnosis Date Noted   GAD (generalized anxiety disorder) 08/06/2023   Sialorrhea 12/28/2022   Renal mass 12/16/2021   Constipation 03/24/2021   Gout 03/23/2021   Primary renal papillary carcinoma, 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   Hepatic steatosis 02/18/2020   Major depression in remission (HCC) 11/11/2019   Central sleep apnea due to Cheyne-Stokes respiration 08/31/2019   Hyperlipidemia associated with type  2 diabetes mellitus (HCC) 07/09/2019   Aortic atherosclerosis (HCC) by CXR on 06/10/2018 06/10/2018   Type 2 diabetes mellitus with stage 3a chronic kidney disease, without long-term current use of insulin  (HCC) 01/21/2018   OSA treated with BiPAP 01/03/2017   REM sleep behavior disorder 12/06/2016   CKD stage 3 due to type 2 diabetes mellitus (HCC) 10/23/2016   Medication management 10/23/2016   Vitamin D  deficiency 10/23/2016   Parkinson's disease (HCC) 01/04/2016   Seasonal and perennial allergic rhinitis 06/21/2014   Asthma, mild intermittent, well-controlled 06/21/2014   Pseudomonas aeruginosa colonization 10/08/2012    CAD (coronary artery disease) 11/15/2011   Essential hypertension 11/15/2011   Morbid obesity with BMI of 40.0-44.9, adult (HCC) 11/15/2011    ONSET DATE:  09/06/23 (referral date)  REFERRING DIAG: G20.A1 (ICD-10-CM) - Parkinson's disease without dyskinesia or fluctuating manifestations (HCC)  THERAPY DIAG:  Other symptoms and signs involving the nervous system  Other symptoms and signs involving the musculoskeletal system  Other lack of coordination  Abnormal posture  Tremor  Unsteadiness on feet  Rationale for Evaluation and Treatment: Rehabilitation  SUBJECTIVE:   SUBJECTIVE STATEMENT: Has been hurting (from kidney again), from UTI.  Insurance delayed start of meds due to initial denial.  Will start meds today.   Pt accompanied by: self  PERTINENT HISTORY:  Pt returns today for 6-83month re-evaluation as recommended when last discharged from occupational therapy 06/26/22.  Pt reports that his PD medication was incr to 4x day in June due to wearing off periods.  PMH includes:  CAD, HTN, HDL, obesity, asthma, DM, CKD, vitamin D  deficiency, REM sleep disorder, OSA, aortic atherosclerosis, hx of gout, GERD, hx of anxiety, tumor on R kidney s/p nephrectomy ([place]--surgery only)  PRECAUTIONS: Fall  WEIGHT BEARING RESTRICTIONS: No  PAIN:  Are you having pain? No and mild from kidney today    FALLS: Has patient fallen in last 6 months? Yes. Number of falls 1 and almost 6 months ago, fell out of bed, now has adjustable bed--recently completed PT to address  LIVING ENVIRONMENT: Lives with: lives with their family and lives with their spouse Lives in: House/apartment  PLOF: Independent, Vocation/Vocational requirements: retired, and Leisure: walking, HEP  PATIENT GOALS:  improve hands  OBJECTIVE:   HAND DOMINANCE: Right  ADLs:  pt reports that some days are tougher than others, has bad Parkinsons days about 2x/wk.  Some difficulty getting items out of  pocket Transfers/ambulation related to ADLs:  min difficulty with bed mobility (mornings)--1 fall, but now has adjustable bed to go lower.  Min difficulty with sofa.  Eating: occasional assist/difficulty with cutting meat, difficulty opening unsealed bottle at times (use both hands and gripper/towel or wife opens new ones) Grooming: mod I, wife assists near ears (but better if shaves later in the morning) UB Dressing: mod I, slow, doesn't wear buttons LB Dressing: mod I, slow, has long handled shoe horn, holds on for balance Toileting: mod I Bathing: difficulty with balance in shower with eyes closed Tub Shower transfers: mod I (no grab bar/seat)  IADLs: Shopping: mod I Light housekeeping: wife does most, mod I for tasks performed, been doing some yard work  Meal Prep: wife does most, mod I for tasks performed Community mobility:  driving--speeds up some unintentionally, stays to side more, takes more effort/concentration but works to focus more, does not drive on bad days Medication management: pt has been forgetting to take meds more (11am and 3pm dose) Financial management: wife has always performed  Handwriting: 100% legible and Moderate micrographia, printing for 3 sentences.     MOBILITY STATUS: Independent, Hx of falls, Freezing, difficulty with turns, start hesitation, and difficulty carrying objections with ambulation  Currently in PT, has noticed changes in balance.  Using 1 walking pole at times.  POSTURE COMMENTS:  rounded shoulders  ACTIVITY TOLERANCE: Activity tolerance: denies difficulty  FUNCTIONAL OUTCOME MEASURES: Standing functional reach: Right: 10 inches; Left: 13 inches Fastening/unfastening 3 buttons: 65.63sec  Physical performance test: PPT#2 (simulated eating) NT due to time constraints & PPT#4 (donning/doffing jacket): 35.89sec PPT#2 Simulated eating in 14.13sec.  (09/28/23) Upper Extremity Functional Scale 66/80.   (09/28/23)  COORDINATION: 9 Hole Peg test:  Right: 42.77 sec; Left: 41.04sec with with trunk/shoulder compensation noted with each hand (decr in-hand manipulation) Box and Blocks:  Right 46 blocks, Left 44 blocks, difficulty with hitting divider/bradykinesia with BUEs Tremors: Resting, Right, and Left, mild  UE ROM:  Bilateral shoulder flex R-135* with elbow ext -5*, L-135* with elbow ext WNL, mild decr supination bilaterally R>L and mild decr R MP ext  Otherwise bilateral UE ROM grossly WNL   UE MMT:   Not tested  SENSATION: Not tested    MUSCLE TONE: RUE: Mild, Moderate, and Rigidity and LUE: Within functional limits  COGNITION: Overall cognitive status: bradyphrenia with word recall, pt denies recent changes  VISION:  no changes per pt  OBSERVATIONS: Bradykinesia   TODAY'S TREATMENT:                                                               11/02/23: PWR! Hands (up, twist, step) with min cueing for large amplitude movements, followed by thumb circles, finger walking (abduction/extension), isolated IP flex/ext with min cueing for large amplitude movements, particularly with R hand.  In-hand manipulation to 2 move large dice in hand to locate specific numbers with incr time/min difficulty and min difficulty to avoid shoulder compensation.  Copying 4 sentences with good legibility and size.   Pulling scarf in each hand with mod cueing for large amplitude with min difficulty L hand and mod difficulty/cueing for R hand  Placing metal pegs in wooden pegboard (various sizes) with R hand with min cueing to avoid shoulder compensation.  10/19/23:   Tying shoes in standing with foot propped in 44sec.  Completed review of hand HEP and performed the following with both hands with min-mod cueing for incr movement amplitude:  PWR! Hands (up, step), thumb circles, putty exercises (gross finger ext/abduction, finger ext, rolling and individual finger pinch with PWR! Hands), pulling bag/scarf into palm, tossing ball from one  hand to the other and then tossing up and catching with the same hand.  Issued and reviewed comprehensive HEP--see pt instructions  Reviewed ways to decr risk of future complications related to PD.  Writing 4 sentences with 100% legibility with only min decr in size approx 10% of time (min v.c. for large amplitude).    10/18/23:  PWR! Hands (up, step) with min v.c. for incr movement amplitude.  Continued review of previous coordination HEP with each hand (and updated/provided min-mod verbal, tactile cueing for large amplitude movements):  Isolated IP flexion/extension, individual finger walking, and rotating 2 balls in each hand.   Checked STGs and some LTGs--see goals section below for updates  Reviewed large amplitude movement strategies for donning/doffing jacket and fastening/unfastening buttons with min-mod cueing and practice.  Pt verbalized understanding and returned demo.  Tying shoe on tabletop (pt forgot his today).  Pt able to tie in 23sec, but primarily used L hand.    Flipping cylinder pegs in hand to manipulate with min-mod verbal, visual cueing for large amplitude and avoid shoulder compensation.     10/05/23:   PWR! Hands (up) x15 with min v.c. for incr movement amplitude.    Continued review of previous coordination HEP with each hand (and updated/provided min-mod verbal and visual cueing for large amplitude movements including use of obstacles/visual targets and PWR! hands):  Isolated IP flexion/extension, individual finger lifts--matching L and R hand height, finger walking (using pencil for targets), thumb circles (with visual targets), picking up coins with each finger and thumb, stacking coins, manipulating coins in hand to place in coin bank, rotating ball in each hand (needed visual targets with L hand).  Pt instructed in how to incorporate visual targets in HEP at home to help incr movement amplitude.      09/28/23:   PWR! Hands (basic 4) x10 each with min cueing  for large amplitude movements (verbal and visual).  Rolling ball up wall for bilateral end range shoulder flex and posture with wt. Bearing through hands on ball at end-range for proprioceptive input with min cueing.  Reviewed the following from coordination HEP (from previous):  flipping cards, dealing cards with thumb, sliding cards across table with each hand with finger ext and min cueing for incr movement amplitude with each hand  Also assessed PPT#2 Simulated eating in 14.13sec.   Upper Extremity Functional Scale 66/80.                                                                   09/14/23:  Evaluation completed today.     PATIENT EDUCATION: Education details: see above for cueing for large amplitude    Person educated: Patient Education method: Explanation, Demonstration, and Verbal cues Education comprehension: verbalized understanding, returned demonstration, and verbal cues required  HOME EXERCISE PROGRAM: 09/28/23 & 5/30:  Reviewed PWR! Hands (basic 4), Reviewed finger ROM HEP, and Began reviewing previous coordination HEP--see above 10/18/23:  Reviewed large amplitude movement strategies for donning/doffing jacket and fastening buttons 10/19/23:   Re-issued comprehensive HEP for coordination/hands and Ways to decr risk of future complications   GOALS: Goals reviewed with patient? Yes  SHORT TERM GOALS: Target date: 10/13/23  Pt will be independent with updated HEP. Goal status: Ongoing/partially met.  Began reviewing, but needs min cueing for large amplitude and would benefit from further review 10/18/23.  2.  Pt will improve coordination for ADLs/IADLs as shown by improving time on 9-hole peg test by at least 5 sec bilaterally.   Baseline:  Right: 42.77 sec; Left: 41.04sec Goal status: Not met/ongoing (not consistent).  10/18/23  R-52.48, 35.89sec.  L-66.09, 42.71sec  3.  Pt will improve bilateral overhead reach as shown by demonstrating at least 140* shoulder flex with  each UE. Baseline: 135* Goal status: Met.  R-145*, L-140*  4.  Pt will improve ability to don/doff jacket and demo decr bradykinesia as shown by improving PPT#4 by at least 4sec. Baseline:  35.89sec Goal  status: Met. 10/18/23--22.14sec   LONG TERM GOALS: Target date: 12/13/23  Pt will verbalize understanding of updated adaptive strategies for ADLs/IADLs to incr independence, safety, and ease. Goal status: Ongoing 11/02/23  2.  Pt will be able to write at least 3 sentences with only min decr in size, 100% legibility. Baseline: mod micrographia Goal status: 10/19/23--ongoing 4 sentences with 100% legibility today with only min decr in size approx 10% (will reassess additional session for consistency).  11/02/23--met, 100% legibility and no significant decr in size  3.  Pt will improve bilateral hand coordination for ADLs as shown by fastening/unfastening 3 buttons in less than 55sec. Baseline:  65.63sec Goal status: 10/18/23  Met. 42.94sec  4.  Pt will improve ability to don/doff jacket and demo decr bradykinesia as shown by improving PPT#4 by at least 8sec. Baseline:  35.89sec Goal status: Met. 10/18/23--22.14sec  5.  Pt will improve ability to tie shoe (while wearing) to complete in 80sec or less.  Baseline:  44sec  Goal status:  New/updated 10/19/23      ASSESSMENT:  CLINICAL IMPRESSION: Pt is progressing towards goals with improving coordination and movement amplitude.  Pt continues to demo difficulty with lin-hand manipulation.  LTG#2 met.   PERFORMANCE DEFICITS: in functional skills including ADLs, IADLs, coordination, dexterity, tone, ROM, strength, flexibility, Fine motor control, Gross motor control, mobility, balance, and UE functional use, cognitive skills including perception and bradyphrenia, and psychosocial skills including environmental adaptation, habits, and routines and behaviors.   IMPAIRMENTS: are limiting patient from ADLs, IADLs, rest and sleep, leisure, and  social participation.   COMORBIDITIES:  may have co-morbidities  that affects occupational performance.   MODIFICATION OR ASSISTANCE TO COMPLETE EVALUATION: Min-Moderate modification of tasks or assist with assess necessary to complete an evaluation.  OT OCCUPATIONAL PROFILE AND HISTORY: Detailed assessment: Review of records and additional review of physical, cognitive, psychosocial history related to current functional performance.  CLINICAL DECISION MAKING: Moderate - several treatment options, min-mod task modification necessary  EVALUATION COMPLEXITY: Moderate    PLAN:  OT FREQUENCY: 1-2x/week  OT DURATION: 16 visits + eval over 12 weeks (due to scheduling purposes)  PLANNED INTERVENTIONS: 97535 self care/ADL training, 02889 therapeutic exercise, 97530 therapeutic activity, 97112 neuromuscular re-education, 97140 manual therapy, 97035 ultrasound, 97018 paraffin, 02989 moist heat, 97010 cryotherapy, passive range of motion, balance training, functional mobility training, visual/perceptual remediation/compensation, energy conservation, patient/family education, and DME and/or AE instructions  RECOMMENDED OTHER SERVICES: none at this time, pt current with PT at [facility]  CONSULTED AND AGREED WITH PLAN OF CARE: Patient  PLAN FOR NEXT SESSION:  large amplitude movements, coordination/in-hand manipulation, continue with ADL strategies (tying shoes, donning/doffing jacket, buttons)  Greyson Riccardi, OTR/L 11/02/2023, 8:56 AM

## 2023-11-02 ENCOUNTER — Ambulatory Visit: Admitting: Occupational Therapy

## 2023-11-02 ENCOUNTER — Other Ambulatory Visit (HOSPITAL_BASED_OUTPATIENT_CLINIC_OR_DEPARTMENT_OTHER): Payer: Self-pay

## 2023-11-02 ENCOUNTER — Encounter: Payer: Self-pay | Admitting: Occupational Therapy

## 2023-11-02 DIAGNOSIS — R29898 Other symptoms and signs involving the musculoskeletal system: Secondary | ICD-10-CM

## 2023-11-02 DIAGNOSIS — R293 Abnormal posture: Secondary | ICD-10-CM

## 2023-11-02 DIAGNOSIS — R471 Dysarthria and anarthria: Secondary | ICD-10-CM | POA: Diagnosis not present

## 2023-11-02 DIAGNOSIS — R2681 Unsteadiness on feet: Secondary | ICD-10-CM

## 2023-11-02 DIAGNOSIS — R29818 Other symptoms and signs involving the nervous system: Secondary | ICD-10-CM

## 2023-11-02 DIAGNOSIS — R251 Tremor, unspecified: Secondary | ICD-10-CM

## 2023-11-02 DIAGNOSIS — R278 Other lack of coordination: Secondary | ICD-10-CM

## 2023-11-03 ENCOUNTER — Encounter (HOSPITAL_BASED_OUTPATIENT_CLINIC_OR_DEPARTMENT_OTHER): Payer: Self-pay

## 2023-11-03 ENCOUNTER — Other Ambulatory Visit (HOSPITAL_BASED_OUTPATIENT_CLINIC_OR_DEPARTMENT_OTHER): Payer: Self-pay

## 2023-11-05 ENCOUNTER — Other Ambulatory Visit (HOSPITAL_BASED_OUTPATIENT_CLINIC_OR_DEPARTMENT_OTHER): Payer: Self-pay

## 2023-11-05 MED ORDER — TAMSULOSIN HCL 0.4 MG PO CAPS
0.4000 mg | ORAL_CAPSULE | Freq: Every day | ORAL | 1 refills | Status: DC
  Filled 2023-11-05: qty 90, 90d supply, fill #0
  Filled 2024-02-11: qty 90, 90d supply, fill #1
  Filled 2024-05-22: qty 3, 3d supply, fill #2

## 2023-11-06 ENCOUNTER — Encounter (HOSPITAL_BASED_OUTPATIENT_CLINIC_OR_DEPARTMENT_OTHER): Payer: Self-pay | Admitting: Family Medicine

## 2023-11-06 ENCOUNTER — Ambulatory Visit (INDEPENDENT_AMBULATORY_CARE_PROVIDER_SITE_OTHER): Admitting: Family Medicine

## 2023-11-06 ENCOUNTER — Other Ambulatory Visit (HOSPITAL_BASED_OUTPATIENT_CLINIC_OR_DEPARTMENT_OTHER): Payer: Self-pay

## 2023-11-06 VITALS — BP 100/72 | HR 71 | Ht 67.0 in | Wt 244.0 lb

## 2023-11-06 DIAGNOSIS — N390 Urinary tract infection, site not specified: Secondary | ICD-10-CM | POA: Diagnosis not present

## 2023-11-06 DIAGNOSIS — E785 Hyperlipidemia, unspecified: Secondary | ICD-10-CM

## 2023-11-06 DIAGNOSIS — E1169 Type 2 diabetes mellitus with other specified complication: Secondary | ICD-10-CM

## 2023-11-06 DIAGNOSIS — K862 Cyst of pancreas: Secondary | ICD-10-CM | POA: Insufficient documentation

## 2023-11-06 DIAGNOSIS — Z7984 Long term (current) use of oral hypoglycemic drugs: Secondary | ICD-10-CM

## 2023-11-06 DIAGNOSIS — I10 Essential (primary) hypertension: Secondary | ICD-10-CM | POA: Diagnosis not present

## 2023-11-06 DIAGNOSIS — E1122 Type 2 diabetes mellitus with diabetic chronic kidney disease: Secondary | ICD-10-CM

## 2023-11-06 DIAGNOSIS — Z905 Acquired absence of kidney: Secondary | ICD-10-CM | POA: Insufficient documentation

## 2023-11-06 DIAGNOSIS — N183 Chronic kidney disease, stage 3 unspecified: Secondary | ICD-10-CM

## 2023-11-06 DIAGNOSIS — N1831 Chronic kidney disease, stage 3a: Secondary | ICD-10-CM | POA: Diagnosis not present

## 2023-11-06 NOTE — Patient Instructions (Addendum)
 Shingles Vaccine   Please take 1/2 tablet of valsartan -hydrochlorothiazide  daily for the next 3 days and push clear fluids.    Get BP machine and check daily. Goal is greater than 110/70

## 2023-11-06 NOTE — Progress Notes (Signed)
 Subjective:   Todd Rice 1956-01-03 11/06/2023  Chief Complaint  Patient presents with   Medical Management of Chronic Issues    98-month follow up; states he had a flareup with pseudomonas which he thinks is getting better. Other than that, he has been doing okay.    HPI: Todd Rice presents today for re-assessment and management of chronic medical conditions.   URINARY CONCERN:  Patient reports recently having a flare of UTI due to chronic pseudomonas carrier. He is currently being managed by [facility] post nephrectomy and chronic UTI status and is taking 3 day course of fosfomycin. He states his BP has been running low in the past 1-2 days. He denies dizziness, headache or syncope. He denies urinary symptoms or decrease in urine production with abx use.   HYPERTENSION: Todd Rice presents for the medical management of hypertension.  Patient's current hypertension medication regimen is: Valsartan -hydrochlorothiazide  320-25mg  daily Patient is  currently taking prescribed medications for HTN.  Patient is not regularly keeping a check on BP at home.  Adhering to low sodium diet: Yes Exercising Regularly: no  Denies headache, dizziness, CP, SHOB, vision changes.   BP Readings from Last 3 Encounters:  11/06/23 100/72  08/13/23 (!) 155/80  08/06/23 139/77     DIABETES MELLITUS: Todd Rice presents for the medical management of diabetes.  Current diabetes medication regimen: Ozempic  0.25mg  weekly (increasing to 0.5mg  next week), Metformin   Patient is  adhering to a diabetic diet.  Patient is not exercising regularly.  Patient is checking BS regularly.  Patient is  checking their feet regularly.  Denies polydipsia, polyphagia, polyuria, open wounds or ulcers on feet.  Lab Results  Component Value Date   HGBA1C 7.1 (H) 08/06/2023    Foot Exam: 08/06/2023 Lab Results  Component Value Date   MICROALBUR 2.1 04/24/2023   MICROALBUR 0.6  03/24/2021    Wt Readings from Last 3 Encounters:  11/06/23 244 lb (110.7 kg)  10/15/23 250 lb (113.4 kg)  08/13/23 247 lb (112 kg)    CHRONIC KIDNEY DISEASE: Todd Rice presents for the medical management of Chronic Kidney Disease due to s/p right radical nephrectomy. He is seen by Dr. Alvaro with [facility].  Patient is  adhering to renal diet. Patient is  on ACE1/ARB therapy.  Patient is  avoiding NSAIDS.    He Does not attend dialysis and does not perform peritoneal dialysis.   Lab Results  Component Value Date   NA 139 08/06/2023   K 4.6 08/06/2023   CO2 21 08/06/2023   GLUCOSE 143 (H) 08/06/2023   BUN 31 (H) 08/06/2023   CREATININE 1.75 (H) 08/06/2023   CALCIUM  10.4 (H) 08/06/2023   EGFR 42 (L) 08/06/2023   GFRNONAA 40 (L) 12/18/2021      HYPERLIPIDEMIA: Todd Rice presents for the medical management of hyperlipidemia.  Patient's current HLD regimen is: Zetia  10mg , Rosuvastatin  20mg   Patient is  currently taking prescribed medications for HLD.  Adhering to heathy diet: Yes Exercising regularly: No Denies myalgias.  Lab Results  Component Value Date   CHOL 103 04/24/2023   HDL 35 (L) 04/24/2023   LDLCALC 45 04/24/2023   TRIG 147 04/24/2023   CHOLHDL 2.9 04/24/2023   The ASCVD Risk score (Arnett DK, et al., 2019) failed to calculate for the following reasons:   The valid total cholesterol range is 130 to 320 mg/dL  The following portions of the patient's history were reviewed and  updated as appropriate: past medical history, past surgical history, family history, social history, allergies, medications, and problem list.   Patient Active Problem List   Diagnosis Date Noted   H/O right radical nephrectomy 11/06/2023   Pancreatic cyst 11/06/2023   Chronic UTI 11/06/2023   GAD (generalized anxiety disorder) 08/06/2023   Sialorrhea 12/28/2022   Constipation 03/24/2021   Gout 03/23/2021   Hepatic steatosis 02/18/2020   Major depression in  remission (HCC) 11/11/2019   Central sleep apnea due to Cheyne-Stokes respiration 08/31/2019   Hyperlipidemia associated with type 2 diabetes mellitus (HCC) 07/09/2019   Aortic atherosclerosis (HCC) by CXR on 06/10/2018 06/10/2018   Type 2 diabetes mellitus with stage 3a chronic kidney disease, without long-term current use of insulin  (HCC) 01/21/2018   OSA treated with BiPAP 01/03/2017   REM sleep behavior disorder 12/06/2016   CKD stage 3 due to type 2 diabetes mellitus (HCC) 10/23/2016   Medication management 10/23/2016   Vitamin D  deficiency 10/23/2016   Parkinson's disease (HCC) 01/04/2016   Seasonal and perennial allergic rhinitis 06/21/2014   Asthma, mild intermittent, well-controlled 06/21/2014   Pseudomonas aeruginosa colonization 10/08/2012   CAD (coronary artery disease) 11/15/2011   Essential hypertension 11/15/2011   Morbid obesity with BMI of 40.0-44.9, adult (HCC) 11/15/2011   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   REM sleep behavior disorder 12/06/2016   Renal mass 12/16/2021   Right ureteral stone    Sleep apnea    Ureteral calculus, right 06/12/2011   Vitamin D  deficiency    Weakness    Past Surgical History:  Procedure Laterality Date   ANAL FISSURE REPAIR  10/12/2000   COLONOSCOPY N/A 07/01/2012   Procedure: COLONOSCOPY;  Surgeon: Princella CHRISTELLA Nida, MD;  Location: [facility];  Service: Endoscopy;  Laterality: N/A;   COLONOSCOPY   11/23/2022   CYSTOSCOPY WITH RETROGRADE PYELOGRAM, URETEROSCOPY AND STENT PLACEMENT Bilateral 02/26/2013   Procedure: CYSTOSCOPY WITH RETROGRADE PYELOGRAM, URETEROSCOPY AND STENT PLACEMENT;  Surgeon: Ricardo Likens, MD;  Location: [facility];  Service: Urology;  Laterality: Bilateral;   HOLMIUM LASER APPLICATION Bilateral 02/26/2013   Procedure: HOLMIUM LASER APPLICATION;  Surgeon: Ricardo Likens, MD;  Location: [facility];  Service: Urology;  Laterality: Bilateral;   kidney stone removal     06/2011-stent placement   LEFT MEDIAL FEMORAL CONDYLE DEBRIDEMENT & DRILLING/ REMOVAL LOOSE BODY  09/15/2003   NASAL SEPTUM SURGERY  05/09/1983   ROBOT ASSISTED LAPAROSCOPIC NEPHRECTOMY Right 12/16/2021   Procedure: XI ROBOTIC ASSISTED LAPAROSCOPIC RADICAL NEPHRECTOMY;  Surgeon: Likens Ricardo, MD;  Location: [facility];  Service: Urology;  Laterality: Right;  3 HRS   STONE EXTRACTION WITH BASKET  06/12/2011   Procedure: STONE EXTRACTION WITH BASKET;  Surgeon: Mark C Ottelin, MD;  Location: [facility];  Service: Urology;  Laterality: Right;  Family History  Problem Relation Age of Onset   Heart disease Mother        Atrial fibrillation   Hypertension Mother    Hyperlipidemia Mother    Lung cancer Father        was a smoker   Cancer Father        lung   Hyperlipidemia Brother    Melanoma Brother    Heart disease Brother        Amyloid   Heart disease Maternal Aunt    Hyperlipidemia Maternal Aunt    Hypertension Maternal Aunt    Stroke Maternal Aunt    Heart disease Paternal Grandmother    Hyperlipidemia Paternal Grandmother    Parkinson's disease Neg Hx    Sleep apnea Neg Hx    Colon cancer Neg Hx    Rectal cancer Neg Hx    Stomach cancer Neg Hx    Outpatient Medications Prior to Visit  Medication Sig Dispense Refill   albuterol  (VENTOLIN  HFA) 108 (90 Base) MCG/ACT inhaler INHALE 2 PUFFS EVERY 4 HOURS (15 MINUTES APART) FOR ASTHMA RESCUE 6.7 g 1   allopurinol  (ZYLOPRIM ) 300 MG  tablet Take 1 tablet (300 mg total) by mouth daily to prevent gout. 90 tablet 3   ALPRAZolam  (XANAX ) 0.5 MG tablet Take 1 tab up to three times a day only if needed for severe anxiety or panic attack. Avoid daily use. 60 tablet 0   aspirin  EC 81 MG tablet Take 81 mg by mouth daily. Swallow whole.     calcium  carbonate (TUMS - DOSED IN MG ELEMENTAL CALCIUM ) 500 MG chewable tablet Chew 1 tablet by mouth daily as needed for indigestion or heartburn.     carbidopa -levodopa  (SINEMET  IR) 25-250 MG tablet Take 1 tablet by mouth 4 (four) times daily. 360 tablet 4   Cholecalciferol (VITAMIN D ) 50 MCG (2000 UT) tablet Take 2,000 Units by mouth 2 (two) times daily.     ezetimibe  (ZETIA ) 10 MG tablet Take 1 tablet (10 mg total) by mouth daily for cholesterol. 90 tablet 3   fosfomycin (MONUROL ) 3 g PACK Take 1 pack (3g) by mouth every other day. 3 each 0   glycopyrrolate  (ROBINUL ) 1 MG tablet Take 1 tablet (1 mg total) by mouth 2 (two) times daily as needed. 60 tablet 11   Magnesium  250 MG TABS Take 1 tablet (250 mg total) by mouth 2 (two) times daily with a meal.     Menthol, Topical Analgesic, (BIOFREEZE EX) Apply 1 Application topically daily as needed (pain).     metFORMIN  (GLUCOPHAGE -XR) 500 MG 24 hr tablet Take 2 tablets (1,000 mg total) by mouth 2 (two) times daily. 360 tablet 3   polyethylene glycol powder (GLYCOLAX /MIRALAX ) 17 GM/SCOOP powder Take 17 g by mouth every other day. Alternating days with senna (Patient taking differently: Take 17 g by mouth daily as needed for mild constipation. Alternating days with senna)     pramipexole  (MIRAPEX ) 1 MG tablet Take 1 tablet (1 mg total) by mouth 3 (three) times daily. 270 tablet 3   rosuvastatin  (CRESTOR ) 20 MG tablet Take 1 tablet (20 mg total) by mouth at bedtime. 90 tablet 3   Semaglutide ,0.25 or 0.5MG /DOS, (OZEMPIC , 0.25 OR 0.5 MG/DOSE,) 2 MG/3ML SOPN Inject 0.25 mg into the skin once a week for 30 days, THEN 0.5 mg once a week.     senna (SENOKOT) 8.6  MG tablet Take 2 tablets by mouth every other day. Alternating days with miralax   sodium chloride  (OCEAN) 0.65 % SOLN nasal spray Place 1 spray into both nostrils as needed for congestion.     tamsulosin  (FLOMAX ) 0.4 MG CAPS capsule Take 1 capsule (0.4 mg total) by mouth daily. 90 capsule 1   valsartan -hydrochlorothiazide  (DIOVAN -HCT) 320-25 MG tablet Take 1 tablet by mouth daily for blood pressure 90 tablet 3   Semaglutide , 1 MG/DOSE, (OZEMPIC , 1 MG/DOSE,) 4 MG/3ML SOPN Inject 1 mg into the skin once a week. 3 mL 0   Facility-Administered Medications Prior to Visit  Medication Dose Route Frequency Provider Last Rate Last Admin   polyethylene glycol powder (GLYCOLAX /MIRALAX ) container 255 g  1 Container Oral Once Manny, Ricardo KATHEE Raddle., MD       Allergies  Allergen Reactions   Ciprofloxacin  Hives   Ceftriaxone  Hives     ROS: A complete ROS was performed with pertinent positives/negatives noted in the HPI. The remainder of the ROS are negative.    Objective:   Today's Vitals   11/06/23 0754 11/06/23 0833  BP: 99/61 100/72  Pulse: 71   SpO2: 98%   Weight: 244 lb (110.7 kg)   Height: 5' 7 (1.702 m)     Physical Exam          GENERAL: Well-appearing, in NAD. Well nourished.  SKIN: Pink, warm and dry.  Head: Normocephalic. NECK: Trachea midline. Full ROM w/o pain or tenderness. RESPIRATORY: Chest wall symmetrical. Respirations even and non-labored. Breath sounds clear to auscultation bilaterally.  CARDIAC: S1, S2 present, regular rate and rhythm without murmur or gallops. Peripheral pulses 2+ bilaterally.  MSK: Muscle tone and strength appropriate for age.  NEUROLOGIC: No motor or sensory deficits. Steady, even gait. C2-C12 intact.  PSYCH/MENTAL STATUS: Alert, oriented x 3. Cooperative, appropriate mood and affect.   Health Maintenance Due  Topic Date Due   Zoster Vaccines- Shingrix (1 of 2) Never done   COVID-19 Vaccine (8 - Pfizer risk 2024-25 season) 07/18/2023    OPHTHALMOLOGY EXAM  10/24/2023    No results found for any visits on 11/06/23.  The ASCVD Risk score (Arnett DK, et al., 2019) failed to calculate for the following reasons:   The valid total cholesterol range is 130 to 320 mg/dL     Assessment & Plan:   1. Type 2 diabetes mellitus with stage 3a chronic kidney disease, without long-term current use of insulin  (HCC) (Primary) Continue current regimen and follow increase of Ozempic  to 0.5mg  weekly x 4 weeks and further titration to 1mg . Patient receiving through [facility] program. Will obtain A1C with labwork today and evaluate titration. Discussed diet and exercise.  - Hemoglobin A1c  2. Hyperlipidemia associated with type 2 diabetes mellitus (HCC) Previously well controlled with current regimen. Pt desires to stop medication. Will assess and titrate medication pending labs.  - Lipid panel  3. CKD stage 3 due to type 2 diabetes mellitus (HCC) Stable. Will obtain CMP with labs today. Discussed dietary change, hydration, and NSAID avoidance.  - Comprehensive metabolic panel with GFR  4. Chronic UTI Managed by Urology. Likely affecting patient's BP with current infection and fosfomycin. Will finish course and follow up with Urology as directed.    5. Hypertension Currently hypotensive. Patient directed to HOLD BP medication if BP is lower than 110/70. He can take a 1/2 tablet if BP is over 110/70. Will follow up with phone call to patient in 48 hours. Continue clear hydration and he will monitor BP with home cuff. Asymptomatic for hypotension at this time.  Return in about 3 months (around 02/06/2024) for Chronic Follow up .    Patient to reach out to office if new, worrisome, or unresolved symptoms arise or if no improvement in patient's condition. Patient verbalized understanding and is agreeable to treatment plan. All questions answered to patient's satisfaction.    Thersia Schuyler Stark, [place]

## 2023-11-07 ENCOUNTER — Ambulatory Visit (HOSPITAL_BASED_OUTPATIENT_CLINIC_OR_DEPARTMENT_OTHER): Payer: Self-pay | Admitting: Family Medicine

## 2023-11-07 ENCOUNTER — Other Ambulatory Visit (HOSPITAL_BASED_OUTPATIENT_CLINIC_OR_DEPARTMENT_OTHER): Payer: Self-pay | Admitting: Family Medicine

## 2023-11-07 DIAGNOSIS — E119 Type 2 diabetes mellitus without complications: Secondary | ICD-10-CM

## 2023-11-07 LAB — LIPID PANEL
Chol/HDL Ratio: 2.8 ratio (ref 0.0–5.0)
Cholesterol, Total: 97 mg/dL — ABNORMAL LOW (ref 100–199)
HDL: 35 mg/dL — ABNORMAL LOW (ref >39)
LDL Chol Calc (NIH): 38 mg/dL (ref 0–99)
Triglycerides: 140 mg/dL (ref 0–149)
VLDL Cholesterol Cal: 24 mg/dL (ref 5–40)

## 2023-11-07 LAB — COMPREHENSIVE METABOLIC PANEL WITH GFR
ALT: 6 IU/L (ref 0–44)
AST: 17 IU/L (ref 0–40)
Albumin: 4.4 g/dL (ref 3.9–4.9)
Alkaline Phosphatase: 61 IU/L (ref 44–121)
BUN/Creatinine Ratio: 16 (ref 10–24)
BUN: 27 mg/dL (ref 8–27)
Bilirubin Total: 0.3 mg/dL (ref 0.0–1.2)
CO2: 20 mmol/L (ref 20–29)
Calcium: 10.2 mg/dL (ref 8.6–10.2)
Chloride: 96 mmol/L (ref 96–106)
Creatinine, Ser: 1.64 mg/dL — ABNORMAL HIGH (ref 0.76–1.27)
Globulin, Total: 2.7 g/dL (ref 1.5–4.5)
Glucose: 132 mg/dL — ABNORMAL HIGH (ref 70–99)
Potassium: 4.6 mmol/L (ref 3.5–5.2)
Sodium: 135 mmol/L (ref 134–144)
Total Protein: 7.1 g/dL (ref 6.0–8.5)
eGFR: 45 mL/min/{1.73_m2} — ABNORMAL LOW (ref >59)

## 2023-11-07 LAB — HEMOGLOBIN A1C
Est. average glucose Bld gHb Est-mCnc: 154 mg/dL
Hgb A1c MFr Bld: 7 % — ABNORMAL HIGH (ref 4.8–5.6)

## 2023-11-07 MED ORDER — METFORMIN HCL ER 500 MG PO TB24: 500.0000 mg | ORAL_TABLET | Freq: Two times a day (BID) | ORAL | Status: DC

## 2023-11-07 NOTE — Progress Notes (Signed)
 Hi Todd Rice, Your A1c has decreased slightly from 7.1 down to 7.0.  Please continue the Ozempic  titration schedule as discussed.  We could trial decreasing your metformin  to 500 mg twice daily (this would be taking 1 tablet twice a day instead of 2 tablets twice a day).  Regarding your cholesterol, this is very well-controlled and your triglycerides have improved with improvement of your blood sugars.  If you would like to trial just taking the Zetia  for the next 3 months and stopping the rosuvastatin  (Crestor ) that would be fine.  If cholesterol or A1c starts to climb after 3 months, we may need to consider other options or go back on your medications.  Please let me know what you choose to do.

## 2023-11-07 NOTE — Therapy (Signed)
 OUTPATIENT OCCUPATIONAL THERAPY [facility]  Patient Name: FLEMON KELTY MRN: [id number] DOB:Feb 23, 1956, 68 y.o., male Today's Date: 11/08/2023  PCP: Thersia Schuyler Stark, FNP REFERRING PROVIDER: Sherryl Bouchard, NP  END OF SESSION:  OT End of Session - 11/08/23 0804     Visit Number 7    Number of Visits 17    Date for OT Re-Evaluation 12/13/23    Authorization Type [employer] Medicare, follows Medicare guidelines,    Authorization Time Period 16 visits approved 09/28/23-12/14/23    Authorization - Visit Number 5    Authorization - Number of Visits 6    Progress Note Due on Visit 10    OT Start Time 0801    OT Stop Time 0848    OT Time Calculation (min) 47 min    Activity Tolerance Patient tolerated treatment well    Behavior During Therapy [facility] for tasks assessed/performed                 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   REM sleep behavior disorder 12/06/2016   Renal mass 12/16/2021   Right ureteral stone    Sleep apnea    Ureteral calculus, right 06/12/2011   Vitamin D  deficiency    Weakness    Past Surgical History:  Procedure Laterality Date   ANAL FISSURE REPAIR  10/12/2000   COLONOSCOPY N/A 07/01/2012   Procedure: COLONOSCOPY;  Surgeon: Princella CHRISTELLA Nida, MD;  Location: [facility];  Service: Endoscopy;  Laterality: N/A;   COLONOSCOPY  11/23/2022   CYSTOSCOPY WITH RETROGRADE PYELOGRAM, URETEROSCOPY AND STENT PLACEMENT  Bilateral 02/26/2013   Procedure: CYSTOSCOPY WITH RETROGRADE PYELOGRAM, URETEROSCOPY AND STENT PLACEMENT;  Surgeon: Ricardo Likens, MD;  Location: [facility];  Service: Urology;  Laterality: Bilateral;   HOLMIUM LASER APPLICATION Bilateral 02/26/2013   Procedure: HOLMIUM LASER APPLICATION;  Surgeon: Ricardo Likens, MD;  Location: [facility];  Service: Urology;  Laterality: Bilateral;   kidney stone removal     06/2011-stent placement   LEFT MEDIAL FEMORAL CONDYLE DEBRIDEMENT & DRILLING/ REMOVAL LOOSE BODY  09/15/2003   NASAL SEPTUM SURGERY  05/09/1983   ROBOT ASSISTED LAPAROSCOPIC NEPHRECTOMY Right 12/16/2021   Procedure: XI ROBOTIC ASSISTED LAPAROSCOPIC RADICAL NEPHRECTOMY;  Surgeon: Likens Ricardo, MD;  Location: [facility];  Service: Urology;  Laterality: Right;  3 HRS   STONE EXTRACTION WITH BASKET  06/12/2011   Procedure: STONE EXTRACTION WITH BASKET;  Surgeon: Mark C Ottelin, MD;  Location: [facility];  Service: Urology;  Laterality: Right;   Patient Active Problem List   Diagnosis Date Noted   H/O right radical nephrectomy 11/06/2023   Pancreatic cyst 11/06/2023   Chronic UTI 11/06/2023   GAD (generalized anxiety disorder) 08/06/2023   Sialorrhea 12/28/2022   Constipation 03/24/2021   Gout 03/23/2021   Hepatic  steatosis 02/18/2020   Major depression in remission (HCC) 11/11/2019   Central sleep apnea due to Cheyne-Stokes respiration 08/31/2019   Hyperlipidemia associated with type 2 diabetes mellitus (HCC) 07/09/2019   Aortic atherosclerosis (HCC) by CXR on 06/10/2018 06/10/2018   Type 2 diabetes mellitus with stage 3a chronic kidney disease, without long-term current use of insulin  (HCC) 01/21/2018   OSA treated with BiPAP 01/03/2017   REM sleep behavior disorder 12/06/2016   CKD stage 3 due to type 2 diabetes mellitus (HCC) 10/23/2016   Medication management 10/23/2016   Vitamin D  deficiency 10/23/2016   Parkinson's disease (HCC) 01/04/2016   Seasonal and perennial  allergic rhinitis 06/21/2014   Asthma, mild intermittent, well-controlled 06/21/2014   Pseudomonas aeruginosa colonization 10/08/2012   CAD (coronary artery disease) 11/15/2011   Essential hypertension 11/15/2011   Morbid obesity with BMI of 40.0-44.9, adult (HCC) 11/15/2011    ONSET DATE:  09/06/23 (referral date)  REFERRING DIAG: G20.A1 (ICD-10-CM) - Parkinson's disease without dyskinesia or fluctuating manifestations (HCC)  THERAPY DIAG:  Other symptoms and signs involving the nervous system  Other symptoms and signs involving the musculoskeletal system  Other lack of coordination  Abnormal posture  Tremor  Unsteadiness on feet  Rationale for Evaluation and Treatment: Rehabilitation  SUBJECTIVE:   SUBJECTIVE STATEMENT: Pt reports that has had medication adjustments (decreasing medication), and had some low blood pressure with antibiotic.  Pt reports BP WNL this morning.  Pt reports some mild L shoulder pain the last few days.  Pt accompanied by: self  PERTINENT HISTORY:  Pt returns today for 6-64month re-evaluation as recommended when last discharged from occupational therapy 06/26/22.  Pt reports that his PD medication was incr to 4x day in June due to wearing off periods.  PMH includes:  CAD, HTN, HDL, obesity, asthma, DM, CKD, vitamin D  deficiency, REM sleep disorder, OSA, aortic atherosclerosis, hx of gout, GERD, hx of anxiety, tumor on R kidney s/p nephrectomy ([place]--surgery only)  PRECAUTIONS: Fall  WEIGHT BEARING RESTRICTIONS: No  PAIN:  Are you having pain? Yes: NPRS scale: 0-2 Pain location: L shoulder Pain description: mild Aggravating factors: certain movements Relieving factors: rest    FALLS: Has patient fallen in last 6 months? Yes. Number of falls 1 and almost 6 months ago, fell out of bed, now has adjustable bed--recently completed PT to address  LIVING ENVIRONMENT: Lives with: lives with their family and lives with their spouse Lives in:  House/apartment  PLOF: Independent, Vocation/Vocational requirements: retired, and Leisure: walking, HEP  PATIENT GOALS:  improve hands  OBJECTIVE:   HAND DOMINANCE: Right  ADLs:  pt reports that some days are tougher than others, has bad Parkinsons days about 2x/wk.  Some difficulty getting items out of pocket Transfers/ambulation related to ADLs:  min difficulty with bed mobility (mornings)--1 fall, but now has adjustable bed to go lower.  Min difficulty with sofa.  Eating: occasional assist/difficulty with cutting meat, difficulty opening unsealed bottle at times (use both hands and gripper/towel or wife opens new ones) Grooming: mod I, wife assists near ears (but better if shaves later in the morning) UB Dressing: mod I, slow, doesn't wear buttons LB Dressing: mod I, slow, has long handled shoe horn, holds on for balance Toileting: mod I Bathing: difficulty with balance in shower with eyes closed Tub Shower transfers: mod I (no grab bar/seat)  IADLs: Shopping: mod I Light housekeeping: wife does most, mod I for tasks performed, been doing some yard work  Meal Prep: wife does most, mod  I for tasks performed Community mobility:  driving--speeds up some unintentionally, stays to side more, takes more effort/concentration but works to focus more, does not drive on bad days Medication management: pt has been forgetting to take meds more (11am and 3pm dose) Financial management: wife has always performed  Handwriting: 100% legible and Moderate micrographia, printing for 3 sentences.     MOBILITY STATUS: Independent, Hx of falls, Freezing, difficulty with turns, start hesitation, and difficulty carrying objections with ambulation  Currently in PT, has noticed changes in balance.  Using 1 walking pole at times.  POSTURE COMMENTS:  rounded shoulders  ACTIVITY TOLERANCE: Activity tolerance: denies difficulty  FUNCTIONAL OUTCOME MEASURES: Standing functional reach: Right: 10 inches;  Left: 13 inches Fastening/unfastening 3 buttons: 65.63sec  Physical performance test: PPT#2 (simulated eating) NT due to time constraints & PPT#4 (donning/doffing jacket): 35.89sec PPT#2 Simulated eating in 14.13sec.  (09/28/23) Upper Extremity Functional Scale 66/80.   (09/28/23)  COORDINATION: 9 Hole Peg test: Right: 42.77 sec; Left: 41.04sec with with trunk/shoulder compensation noted with each hand (decr in-hand manipulation) Box and Blocks:  Right 46 blocks, Left 44 blocks, difficulty with hitting divider/bradykinesia with BUEs Tremors: Resting, Right, and Left, mild  UE ROM:  Bilateral shoulder flex R-135* with elbow ext -5*, L-135* with elbow ext WNL, mild decr supination bilaterally R>L and mild decr R MP ext  Otherwise bilateral UE ROM grossly WNL   UE MMT:   Not tested  SENSATION: Not tested    MUSCLE TONE: RUE: Mild, Moderate, and Rigidity and LUE: Within functional limits  COGNITION: Overall cognitive status: bradyphrenia with word recall, pt denies recent changes  VISION:  no changes per pt  OBSERVATIONS: Bradykinesia   TODAY'S TREATMENT:                                             11/08/23:   Closed-chain shoulder flex and diagonals to each side in standing with min-mod cueing for large amplitude.    PWR! Hands (up, twist, step) with min-mod cueing for large amplitude.  Flipping cards with both hands simultaneously with emphasis on full finger ext and supination with mod cueing for large amplitude.  Then dealing cards with thumb with each hand with min cueing for large amplitude.    Donning/doffing jacket x3 and fastening/unfastening buttons x2 with min-mod cueing for large amplitude movement techniques.    Placing small pegs in pegboard to copy design with each hand with min-mod cueing for large amplitude movements and for shoulder position to decr compensation and improve in-hand manipulation.    Pulling scarf in each hand with mod cueing for large amplitude with  min difficulty L hand and mod difficulty/cueing for R hand (modified to perform on leg).                       11/02/23: PWR! Hands (up, twist, step) with min cueing for large amplitude movements, followed by thumb circles, finger walking (abduction/extension), isolated IP flex/ext with min cueing for large amplitude movements, particularly with R hand.  In-hand manipulation to 2 move large dice in hand to locate specific numbers with incr time/min difficulty and min difficulty to avoid shoulder compensation.  Copying 4 sentences with good legibility and size.   Pulling scarf in each hand with mod cueing for large amplitude with min difficulty L hand and mod difficulty/cueing for  R hand  Placing metal pegs in wooden pegboard (various sizes) with R hand with min cueing to avoid shoulder compensation.  10/19/23:   Tying shoes in standing with foot propped in 44sec.  Completed review of hand HEP and performed the following with both hands with min-mod cueing for incr movement amplitude:  PWR! Hands (up, step), thumb circles, putty exercises (gross finger ext/abduction, finger ext, rolling and individual finger pinch with PWR! Hands), pulling bag/scarf into palm, tossing ball from one hand to the other and then tossing up and catching with the same hand.  Issued and reviewed comprehensive HEP--see pt instructions  Reviewed ways to decr risk of future complications related to PD.  Writing 4 sentences with 100% legibility with only min decr in size approx 10% of time (min v.c. for large amplitude).    10/18/23:  PWR! Hands (up, step) with min v.c. for incr movement amplitude.  Continued review of previous coordination HEP with each hand (and updated/provided min-mod verbal, tactile cueing for large amplitude movements):  Isolated IP flexion/extension, individual finger walking, and rotating 2 balls in each hand.   Checked STGs and some LTGs--see goals section below for updates    Reviewed  large amplitude movement strategies for donning/doffing jacket and fastening/unfastening buttons with min-mod cueing and practice.  Pt verbalized understanding and returned demo.  Tying shoe on tabletop (pt forgot his today).  Pt able to tie in 23sec, but primarily used L hand.    Flipping cylinder pegs in hand to manipulate with min-mod verbal, visual cueing for large amplitude and avoid shoulder compensation.     10/05/23:   PWR! Hands (up) x15 with min v.c. for incr movement amplitude.    Continued review of previous coordination HEP with each hand (and updated/provided min-mod verbal and visual cueing for large amplitude movements including use of obstacles/visual targets and PWR! hands):  Isolated IP flexion/extension, individual finger lifts--matching L and R hand height, finger walking (using pencil for targets), thumb circles (with visual targets), picking up coins with each finger and thumb, stacking coins, manipulating coins in hand to place in coin bank, rotating ball in each hand (needed visual targets with L hand).  Pt instructed in how to incorporate visual targets in HEP at home to help incr movement amplitude.      09/28/23:   PWR! Hands (basic 4) x10 each with min cueing for large amplitude movements (verbal and visual).  Rolling ball up wall for bilateral end range shoulder flex and posture with wt. Bearing through hands on ball at end-range for proprioceptive input with min cueing.  Reviewed the following from coordination HEP (from previous):  flipping cards, dealing cards with thumb, sliding cards across table with each hand with finger ext and min cueing for incr movement amplitude with each hand  Also assessed PPT#2 Simulated eating in 14.13sec.   Upper Extremity Functional Scale 66/80.                                                                   09/14/23:  Evaluation completed today.     PATIENT EDUCATION: Education details: see above for cueing for large amplitude  and ADL strategies   Person educated: Patient Education method: Explanation, Demonstration, and Verbal cues Education comprehension:  verbalized understanding, returned demonstration, and verbal cues required  HOME EXERCISE PROGRAM: 09/28/23 & 5/30:  Reviewed PWR! Hands (basic 4), Reviewed finger ROM HEP, and Began reviewing previous coordination HEP--see above 10/18/23:  Reviewed large amplitude movement strategies for donning/doffing jacket and fastening buttons 10/19/23:   Re-issued comprehensive HEP for coordination/hands and Ways to decr risk of future complications   GOALS: Goals reviewed with patient? Yes  SHORT TERM GOALS: Target date: 10/13/23  Pt will be independent with updated HEP. Goal status: Ongoing/partially met.  Began reviewing, but needs min cueing for large amplitude and would benefit from further review 10/18/23, 11/08/23  2.  Pt will improve coordination for ADLs/IADLs as shown by improving time on 9-hole peg test by at least 5 sec bilaterally.   Baseline:  Right: 42.77 sec; Left: 41.04sec Goal status: Not met/ongoing (not consistent).  10/18/23  R-52.48, 35.89sec.  L-66.09, 42.71sec  3.  Pt will improve bilateral overhead reach as shown by demonstrating at least 140* shoulder flex with each UE. Baseline: 135* Goal status: Met.  R-145*, L-140*  4.  Pt will improve ability to don/doff jacket and demo decr bradykinesia as shown by improving PPT#4 by at least 4sec. Baseline:  35.89sec Goal status: Met. 10/18/23--22.14sec   LONG TERM GOALS: Target date: 12/13/23  Pt will verbalize understanding of updated adaptive strategies for ADLs/IADLs to incr independence, safety, and ease. Goal status: Ongoing 11/02/23, 11/08/23  2.  Pt will be able to write at least 3 sentences with only min decr in size, 100% legibility. Baseline: mod micrographia Goal status: 10/19/23--ongoing 4 sentences with 100% legibility today with only min decr in size approx 10% (will reassess additional  session for consistency).  11/02/23--met, 100% legibility and no significant decr in size  3.  Pt will improve bilateral hand coordination for ADLs as shown by fastening/unfastening 3 buttons in less than 55sec. Baseline:  65.63sec Goal status: 10/18/23  Met. 42.94sec  4.  Pt will improve ability to don/doff jacket and demo decr bradykinesia as shown by improving PPT#4 by at least 8sec. Baseline:  35.89sec Goal status: Met. 10/18/23--22.14sec  5.  Pt will improve ability to tie shoe (while wearing) to complete in 80sec or less.  Baseline:  44sec  Goal status:  New/updated 10/19/23      ASSESSMENT:  CLINICAL IMPRESSION: Pt is progressing towards goals with improving coordination and movement amplitude.  Pt continues to demo difficulty with lin-hand manipulation.  Improved large amplitude movements for ADLs with cueing.    PERFORMANCE DEFICITS: in functional skills including ADLs, IADLs, coordination, dexterity, tone, ROM, strength, flexibility, Fine motor control, Gross motor control, mobility, balance, and UE functional use, cognitive skills including perception and bradyphrenia, and psychosocial skills including environmental adaptation, habits, and routines and behaviors.   IMPAIRMENTS: are limiting patient from ADLs, IADLs, rest and sleep, leisure, and social participation.   COMORBIDITIES:  may have co-morbidities  that affects occupational performance.   MODIFICATION OR ASSISTANCE TO COMPLETE EVALUATION: Min-Moderate modification of tasks or assist with assess necessary to complete an evaluation.  OT OCCUPATIONAL PROFILE AND HISTORY: Detailed assessment: Review of records and additional review of physical, cognitive, psychosocial history related to current functional performance.  CLINICAL DECISION MAKING: Moderate - several treatment options, min-mod task modification necessary  EVALUATION COMPLEXITY: Moderate    PLAN:  OT FREQUENCY: 1-2x/week  OT DURATION: 16 visits  + eval over 12 weeks (due to scheduling purposes)  PLANNED INTERVENTIONS: 97535 self care/ADL training, 02889 therapeutic exercise, 97530 therapeutic activity, [id number]  neuromuscular re-education, 97140 manual therapy, 97035 ultrasound, 02981 paraffin, 97010 moist heat, 97010 cryotherapy, passive range of motion, balance training, functional mobility training, visual/perceptual remediation/compensation, energy conservation, patient/family education, and DME and/or AE instructions  RECOMMENDED OTHER SERVICES: none at this time, pt current with PT at [facility]  CONSULTED AND AGREED WITH PLAN OF CARE: Patient  PLAN FOR NEXT SESSION:  large amplitude movements, coordination/in-hand manipulation, continue with ADL strategies (tying shoes), check 9-hole peg test  [facility], OTR/L 11/08/2023, 8:57 AM

## 2023-11-08 ENCOUNTER — Ambulatory Visit: Attending: Adult Health | Admitting: Occupational Therapy

## 2023-11-08 ENCOUNTER — Encounter: Payer: Self-pay | Admitting: Occupational Therapy

## 2023-11-08 DIAGNOSIS — R29898 Other symptoms and signs involving the musculoskeletal system: Secondary | ICD-10-CM | POA: Insufficient documentation

## 2023-11-08 DIAGNOSIS — R278 Other lack of coordination: Secondary | ICD-10-CM | POA: Insufficient documentation

## 2023-11-08 DIAGNOSIS — R293 Abnormal posture: Secondary | ICD-10-CM | POA: Diagnosis present

## 2023-11-08 DIAGNOSIS — R251 Tremor, unspecified: Secondary | ICD-10-CM | POA: Insufficient documentation

## 2023-11-08 DIAGNOSIS — R29818 Other symptoms and signs involving the nervous system: Secondary | ICD-10-CM | POA: Diagnosis present

## 2023-11-08 DIAGNOSIS — R2681 Unsteadiness on feet: Secondary | ICD-10-CM | POA: Diagnosis present

## 2023-11-20 ENCOUNTER — Telehealth: Payer: Self-pay

## 2023-11-20 ENCOUNTER — Other Ambulatory Visit: Payer: Self-pay

## 2023-11-20 NOTE — Progress Notes (Addendum)
   11/20/2023  Patient ID: Todd Rice, male   DOB: 02-10-1956, 68 y.o.   MRN: [id number]  Patient called back and stated that he was actually at a funeral home making arrangements for family member that passed who was 69 years old.   Patient wanted to reschedule the appointment and asked that I reschedule based on my availability and he will check in MyChart if the date is appropriate.   Will send [name] the date and time of rescheduled appointment. Will likely follow up in 1-2 weeks; Ozempic  prescription refill due in September based on when patient started and the requested supply on the application. He did state that nothing has changed since the last time I spoke with him.   Next appointment: 12/04/2023 @ 2 pm   Thank you for allowing pharmacy to be a part of this patient's care.  Dorcas Solian, PharmD Clinical Pharmacist Cell: [phone]

## 2023-11-20 NOTE — Progress Notes (Signed)
   11/20/2023  Patient ID: Todd Rice, male   DOB: 07/28/1955, 68 y.o.   MRN: [id number]  Attempted to contact patient for medication management/review. Left HIPAA compliant message for patient to return my call at their convenience.   Attempt for patient outreach. Will follow up with patient to reschedule.  Thank you for allowing pharmacy to be a part of this patient's care.  Dorcas Solian, PharmD Clinical Pharmacist Cell: [phone]

## 2023-11-22 NOTE — Therapy (Signed)
 OUTPATIENT OCCUPATIONAL THERAPY [facility]  Patient Name: Todd Rice MRN: [id number] DOB:[birth year], 68 y.o., male Today's Date: 11/23/2023  PCP: Thersia Schuyler Stark, FNP REFERRING PROVIDER: Sherryl Bouchard, NP  END OF SESSION:  OT End of Session - 11/23/23 0802     Visit Number 8    Number of Visits 17    Date for OT Re-Evaluation 12/13/23    Authorization Type [employer] Medicare, follows Medicare guidelines,    Authorization Time Period 16 visits approved 09/28/23-12/14/23    Authorization - Visit Number 6    Authorization - Number of Visits 6    Progress Note Due on Visit 10    OT Start Time 0801    OT Stop Time 0830    OT Time Calculation (min) 29 min    Activity Tolerance Patient tolerated treatment well    Behavior During Therapy [facility] for tasks assessed/performed                  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   REM sleep behavior disorder 12/06/2016   Renal mass 12/16/2021   Right ureteral stone    Sleep apnea    Ureteral calculus, right 06/12/2011   Vitamin D  deficiency    Weakness    Past Surgical History:  Procedure Laterality Date   ANAL FISSURE REPAIR  10/12/2000   COLONOSCOPY N/A 07/01/2012   Procedure: COLONOSCOPY;  Surgeon: Princella CHRISTELLA Nida, MD;  Location: [facility];  Service: Endoscopy;  Laterality: N/A;   COLONOSCOPY  11/23/2022   CYSTOSCOPY WITH RETROGRADE PYELOGRAM, URETEROSCOPY AND STENT  PLACEMENT Bilateral 02/26/2013   Procedure: CYSTOSCOPY WITH RETROGRADE PYELOGRAM, URETEROSCOPY AND STENT PLACEMENT;  Surgeon: Ricardo Likens, MD;  Location: [facility];  Service: Urology;  Laterality: Bilateral;   HOLMIUM LASER APPLICATION Bilateral 02/26/2013   Procedure: HOLMIUM LASER APPLICATION;  Surgeon: Ricardo Likens, MD;  Location: [facility];  Service: Urology;  Laterality: Bilateral;   kidney stone removal     06/2011-stent placement   LEFT MEDIAL FEMORAL CONDYLE DEBRIDEMENT & DRILLING/ REMOVAL LOOSE BODY  09/15/2003   NASAL SEPTUM SURGERY  05/09/1983   ROBOT ASSISTED LAPAROSCOPIC NEPHRECTOMY Right 12/16/2021   Procedure: XI ROBOTIC ASSISTED LAPAROSCOPIC RADICAL NEPHRECTOMY;  Surgeon: Likens Ricardo, MD;  Location: [facility];  Service: Urology;  Laterality: Right;  3 HRS   STONE EXTRACTION WITH BASKET  06/12/2011   Procedure: STONE EXTRACTION WITH BASKET;  Surgeon: Mark C Ottelin, MD;  Location: [facility];  Service: Urology;  Laterality: Right;   Patient Active Problem List   Diagnosis Date Noted   H/O right radical nephrectomy 11/06/2023   Pancreatic cyst 11/06/2023   Chronic UTI 11/06/2023   GAD (generalized anxiety disorder) 08/06/2023   Sialorrhea 12/28/2022   Constipation 03/24/2021   Gout 03/23/2021  Hepatic steatosis 02/18/2020   Major depression in remission (HCC) 11/11/2019   Central sleep apnea due to Cheyne-Stokes respiration 08/31/2019   Hyperlipidemia associated with type 2 diabetes mellitus (HCC) 07/09/2019   Aortic atherosclerosis (HCC) by CXR on 06/10/2018 06/10/2018   Type 2 diabetes mellitus with stage 3a chronic kidney disease, without long-term current use of insulin  (HCC) 01/21/2018   OSA treated with BiPAP 01/03/2017   REM sleep behavior disorder 12/06/2016   CKD stage 3 due to type 2 diabetes mellitus (HCC) 10/23/2016   Medication management 10/23/2016   Vitamin D  deficiency 10/23/2016   Parkinson's disease (HCC) 01/04/2016   Seasonal and  perennial allergic rhinitis 06/21/2014   Asthma, mild intermittent, well-controlled 06/21/2014   Pseudomonas aeruginosa colonization 10/08/2012   CAD (coronary artery disease) 11/15/2011   Essential hypertension 11/15/2011   Morbid obesity with BMI of 40.0-44.9, adult (HCC) 11/15/2011    ONSET DATE:  09/06/23 (referral date)  REFERRING DIAG: G20.A1 (ICD-10-CM) - Parkinson's disease without dyskinesia or fluctuating manifestations (HCC)  THERAPY DIAG:  Other symptoms and signs involving the nervous system  Other symptoms and signs involving the musculoskeletal system  Other lack of coordination  Abnormal posture  Tremor  Unsteadiness on feet  Rationale for Evaluation and Treatment: Rehabilitation  SUBJECTIVE:   SUBJECTIVE STATEMENT: Pt needs to leave early today to go to a funeral.   Pt accompanied by: self  PERTINENT HISTORY:  Pt returns today for 6-52month re-evaluation as recommended when last discharged from occupational therapy 06/26/22.  Pt reports that his PD medication was incr to 4x day in June due to wearing off periods.  PMH includes:  CAD, HTN, HDL, obesity, asthma, DM, CKD, vitamin D  deficiency, REM sleep disorder, OSA, aortic atherosclerosis, hx of gout, GERD, hx of anxiety, tumor on R kidney s/p nephrectomy ([place]--surgery only)  PRECAUTIONS: Fall  WEIGHT BEARING RESTRICTIONS: No  PAIN:  Are you having pain? Yes: NPRS scale: 0-2 Pain location: L shoulder Pain description: mild Aggravating factors: certain movements Relieving factors: rest    FALLS: Has patient fallen in last 6 months? Yes. Number of falls 1 and almost 6 months ago, fell out of bed, now has adjustable bed--recently completed PT to address  LIVING ENVIRONMENT: Lives with: lives with their family and lives with their spouse Lives in: House/apartment  PLOF: Independent, Vocation/Vocational requirements: retired, and Leisure: walking, HEP  PATIENT GOALS:  improve hands  OBJECTIVE:   HAND  DOMINANCE: Right  ADLs:  pt reports that some days are tougher than others, has bad Parkinsons days about 2x/wk.  Some difficulty getting items out of pocket Transfers/ambulation related to ADLs:  min difficulty with bed mobility (mornings)--1 fall, but now has adjustable bed to go lower.  Min difficulty with sofa.  Eating: occasional assist/difficulty with cutting meat, difficulty opening unsealed bottle at times (use both hands and gripper/towel or wife opens new ones) Grooming: mod I, wife assists near ears (but better if shaves later in the morning) UB Dressing: mod I, slow, doesn't wear buttons LB Dressing: mod I, slow, has long handled shoe horn, holds on for balance Toileting: mod I Bathing: difficulty with balance in shower with eyes closed Tub Shower transfers: mod I (no grab bar/seat)  IADLs: Shopping: mod I Light housekeeping: wife does most, mod I for tasks performed, been doing some yard work  Meal Prep: wife does most, mod I for tasks performed Community mobility:  driving--speeds up some unintentionally, stays to side more, takes more effort/concentration but works to focus more,  does not drive on bad days Medication management: pt has been forgetting to take meds more (11am and 3pm dose) Financial management: wife has always performed  Handwriting: 100% legible and Moderate micrographia, printing for 3 sentences.     MOBILITY STATUS: Independent, Hx of falls, Freezing, difficulty with turns, start hesitation, and difficulty carrying objections with ambulation  Currently in PT, has noticed changes in balance.  Using 1 walking pole at times.  POSTURE COMMENTS:  rounded shoulders  ACTIVITY TOLERANCE: Activity tolerance: denies difficulty  FUNCTIONAL OUTCOME MEASURES: Standing functional reach: Right: 10 inches; Left: 13 inches Fastening/unfastening 3 buttons: 65.63sec  Physical performance test: PPT#2 (simulated eating) NT due to time constraints & PPT#4  (donning/doffing jacket): 35.89sec PPT#2 Simulated eating in 14.13sec.  (09/28/23) Upper Extremity Functional Scale 66/80.   (09/28/23)  COORDINATION: 9 Hole Peg test: Right: 42.77 sec; Left: 41.04sec with with trunk/shoulder compensation noted with each hand (decr in-hand manipulation) Box and Blocks:  Right 46 blocks, Left 44 blocks, difficulty with hitting divider/bradykinesia with BUEs Tremors: Resting, Right, and Left, mild  UE ROM:  Bilateral shoulder flex R-135* with elbow ext -5*, L-135* with elbow ext WNL, mild decr supination bilaterally R>L and mild decr R MP ext  Otherwise bilateral UE ROM grossly WNL   UE MMT:   Not tested  SENSATION: Not tested    MUSCLE TONE: RUE: Mild, Moderate, and Rigidity and LUE: Within functional limits  COGNITION: Overall cognitive status: bradyphrenia with word recall, pt denies recent changes  VISION:  no changes per pt  OBSERVATIONS: Bradykinesia   TODAY'S TREATMENT:                                             11/23/23:   PWR! Hands (basic 4) with min cueing for large amplitude movements (particularly with conversation/dual task).  Placing grooved pegs in pegboard with each hand with min-mod difficulty/cues for incr in-hand manipulation.   Flipping cylinder pegs with each hand by manipulating in-hand with each UE with min-mod difficulty and cueing for compensation.      11/08/23:   Closed-chain shoulder flex and diagonals to each side in standing with min-mod cueing for large amplitude.    PWR! Hands (up, twist, step) with min-mod cueing for large amplitude.  Flipping cards with both hands simultaneously with emphasis on full finger ext and supination with mod cueing for large amplitude.  Then dealing cards with thumb with each hand with min cueing for large amplitude.    Donning/doffing jacket x3 and fastening/unfastening buttons x2 with min-mod cueing for large amplitude movement techniques.    Placing small pegs in pegboard to  copy design with each hand with min-mod cueing for large amplitude movements and for shoulder position to decr compensation and improve in-hand manipulation.    Pulling scarf in each hand with mod cueing for large amplitude with min difficulty L hand and mod difficulty/cueing for R hand (modified to perform on leg).                       11/02/23: PWR! Hands (up, twist, step) with min cueing for large amplitude movements, followed by thumb circles, finger walking (abduction/extension), isolated IP flex/ext with min cueing for large amplitude movements, particularly with R hand.  In-hand manipulation to 2 move large dice in hand to locate specific numbers with incr time/min difficulty and  min difficulty to avoid shoulder compensation.  Copying 4 sentences with good legibility and size.   Pulling scarf in each hand with mod cueing for large amplitude with min difficulty L hand and mod difficulty/cueing for R hand  Placing metal pegs in wooden pegboard (various sizes) with R hand with min cueing to avoid shoulder compensation.  10/19/23:   Tying shoes in standing with foot propped in 44sec.  Completed review of hand HEP and performed the following with both hands with min-mod cueing for incr movement amplitude:  PWR! Hands (up, step), thumb circles, putty exercises (gross finger ext/abduction, finger ext, rolling and individual finger pinch with PWR! Hands), pulling bag/scarf into palm, tossing ball from one hand to the other and then tossing up and catching with the same hand.  Issued and reviewed comprehensive HEP--see pt instructions  Reviewed ways to decr risk of future complications related to PD.  Writing 4 sentences with 100% legibility with only min decr in size approx 10% of time (min v.c. for large amplitude).    10/18/23:  PWR! Hands (up, step) with min v.c. for incr movement amplitude.  Continued review of previous coordination HEP with each hand (and updated/provided min-mod  verbal, tactile cueing for large amplitude movements):  Isolated IP flexion/extension, individual finger walking, and rotating 2 balls in each hand.   Checked STGs and some LTGs--see goals section below for updates    Reviewed large amplitude movement strategies for donning/doffing jacket and fastening/unfastening buttons with min-mod cueing and practice.  Pt verbalized understanding and returned demo.  Tying shoe on tabletop (pt forgot his today).  Pt able to tie in 23sec, but primarily used L hand.    Flipping cylinder pegs in hand to manipulate with min-mod verbal, visual cueing for large amplitude and avoid shoulder compensation.     10/05/23:   PWR! Hands (up) x15 with min v.c. for incr movement amplitude.    Continued review of previous coordination HEP with each hand (and updated/provided min-mod verbal and visual cueing for large amplitude movements including use of obstacles/visual targets and PWR! hands):  Isolated IP flexion/extension, individual finger lifts--matching L and R hand height, finger walking (using pencil for targets), thumb circles (with visual targets), picking up coins with each finger and thumb, stacking coins, manipulating coins in hand to place in coin bank, rotating ball in each hand (needed visual targets with L hand).  Pt instructed in how to incorporate visual targets in HEP at home to help incr movement amplitude.      09/28/23:   PWR! Hands (basic 4) x10 each with min cueing for large amplitude movements (verbal and visual).  Rolling ball up wall for bilateral end range shoulder flex and posture with wt. Bearing through hands on ball at end-range for proprioceptive input with min cueing.  Reviewed the following from coordination HEP (from previous):  flipping cards, dealing cards with thumb, sliding cards across table with each hand with finger ext and min cueing for incr movement amplitude with each hand  Also assessed PPT#2 Simulated eating in 14.13sec.    Upper Extremity Functional Scale 66/80.  09/14/23:  Evaluation completed today.     PATIENT EDUCATION: Education details:  see above for cueing for large amplitude  Person educated: Patient Education method: Explanation, Demonstration, and Verbal cues Education comprehension: verbalized understanding, returned demonstration, and verbal cues required  HOME EXERCISE PROGRAM: 09/28/23 & 5/30:  Reviewed PWR! Hands (basic 4), Reviewed finger ROM HEP, and Began reviewing previous coordination HEP--see above 10/18/23:  Reviewed large amplitude movement strategies for donning/doffing jacket and fastening buttons 10/19/23:   Re-issued comprehensive HEP for coordination/hands and Ways to decr risk of future complications   GOALS: Goals reviewed with patient? Yes  SHORT TERM GOALS: Target date: 10/13/23  Pt will be independent with updated HEP. Goal status: Ongoing/partially met.  Began reviewing, but needs min cueing for large amplitude and would benefit from further review 10/18/23, 11/08/23  2.  Pt will improve coordination for ADLs/IADLs as shown by improving time on 9-hole peg test by at least 5 sec bilaterally.   Baseline:  Right: 42.77 sec; Left: 41.04sec Goal status: Not met/ongoing (not consistent).  10/18/23  R-52.48, 35.89sec.  L-66.09, 42.71sec  3.  Pt will improve bilateral overhead reach as shown by demonstrating at least 140* shoulder flex with each UE. Baseline: 135* Goal status: Met.  R-145*, L-140*  4.  Pt will improve ability to don/doff jacket and demo decr bradykinesia as shown by improving PPT#4 by at least 4sec. Baseline:  35.89sec Goal status: Met. 10/18/23--22.14sec   LONG TERM GOALS: Target date: 12/13/23  Pt will verbalize understanding of updated adaptive strategies for ADLs/IADLs to incr independence, safety, and ease. Goal status: Ongoing 11/02/23, 11/08/23  2.  Pt will be able to write at least 3  sentences with only min decr in size, 100% legibility. Baseline: mod micrographia Goal status: 10/19/23--ongoing 4 sentences with 100% legibility today with only min decr in size approx 10% (will reassess additional session for consistency).  11/02/23--met, 100% legibility and no significant decr in size  3.  Pt will improve bilateral hand coordination for ADLs as shown by fastening/unfastening 3 buttons in less than 55sec. Baseline:  65.63sec Goal status: 10/18/23  Met. 42.94sec  4.  Pt will improve ability to don/doff jacket and demo decr bradykinesia as shown by improving PPT#4 by at least 8sec. Baseline:  35.89sec Goal status: Met. 10/18/23--22.14sec  5.  Pt will improve ability to tie shoe (while wearing) to complete in 80sec or less.  Baseline:  44sec  Goal status:  New/updated 10/19/23      ASSESSMENT:  CLINICAL IMPRESSION: Pt is progressing towards goals with improving coordination and movement amplitude.  Pt continues to demo difficulty with lin-hand manipulation, but less compensation noted today.      PERFORMANCE DEFICITS: in functional skills including ADLs, IADLs, coordination, dexterity, tone, ROM, strength, flexibility, Fine motor control, Gross motor control, mobility, balance, and UE functional use, cognitive skills including perception and bradyphrenia, and psychosocial skills including environmental adaptation, habits, and routines and behaviors.   IMPAIRMENTS: are limiting patient from ADLs, IADLs, rest and sleep, leisure, and social participation.   COMORBIDITIES:  may have co-morbidities  that affects occupational performance.   MODIFICATION OR ASSISTANCE TO COMPLETE EVALUATION: Min-Moderate modification of tasks or assist with assess necessary to complete an evaluation.  OT OCCUPATIONAL PROFILE AND HISTORY: Detailed assessment: Review of records and additional review of physical, cognitive, psychosocial history related to current functional  performance.  CLINICAL DECISION MAKING: Moderate - several treatment options, min-mod task modification necessary  EVALUATION COMPLEXITY: Moderate    PLAN:  OT FREQUENCY: 1-2x/week  OT DURATION: 16 visits + eval over 12 weeks (due to scheduling purposes)  PLANNED INTERVENTIONS: 97535 self care/ADL training, 02889 therapeutic exercise, 97530 therapeutic activity, 97112 neuromuscular re-education, 97140 manual therapy, 97035 ultrasound, 97018 paraffin, 02989 moist heat, 97010 cryotherapy, passive range of motion, balance training, functional mobility training, visual/perceptual remediation/compensation, energy conservation, patient/family education, and DME and/or AE instructions  RECOMMENDED OTHER SERVICES: none at this time, pt current with PT at [facility]  CONSULTED AND AGREED WITH PLAN OF CARE: Patient  PLAN FOR NEXT SESSION:  large amplitude movements, coordination/in-hand manipulation, continue with ADL strategies (tying shoes), check 9-hole peg test  [facility], OTR/L 11/23/2023, 8:10 AM

## 2023-11-23 ENCOUNTER — Encounter: Payer: Self-pay | Admitting: Occupational Therapy

## 2023-11-23 ENCOUNTER — Ambulatory Visit: Admitting: Occupational Therapy

## 2023-11-23 DIAGNOSIS — R278 Other lack of coordination: Secondary | ICD-10-CM

## 2023-11-23 DIAGNOSIS — R29818 Other symptoms and signs involving the nervous system: Secondary | ICD-10-CM | POA: Diagnosis not present

## 2023-11-23 DIAGNOSIS — R29898 Other symptoms and signs involving the musculoskeletal system: Secondary | ICD-10-CM

## 2023-11-23 DIAGNOSIS — R251 Tremor, unspecified: Secondary | ICD-10-CM

## 2023-11-23 DIAGNOSIS — R293 Abnormal posture: Secondary | ICD-10-CM

## 2023-11-23 DIAGNOSIS — R2681 Unsteadiness on feet: Secondary | ICD-10-CM

## 2023-11-28 NOTE — Therapy (Incomplete)
 OUTPATIENT OCCUPATIONAL THERAPY [facility]  Patient Name: Todd Rice MRN: [id number] DOB:07-29-1955, 68 y.o., male Today's Date: 11/29/2023  PCP: Thersia Schuyler Stark, FNP REFERRING PROVIDER: Sherryl Bouchard, NP  END OF SESSION:  OT End of Session - 11/29/23 0813     Visit Number 9    Number of Visits 17    Date for OT Re-Evaluation 12/13/23    Authorization Type [employer] Medicare, follows Medicare guidelines,    Authorization Time Period 16 visits approved 09/28/23-12/14/23    Authorization - Visit Number 9    Authorization - Number of Visits 9    Progress Note Due on Visit 10    OT Start Time 0802    OT Stop Time 0853    OT Time Calculation (min) 51 min    Activity Tolerance Patient tolerated treatment well    Behavior During Therapy [facility] for tasks assessed/performed                   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   REM sleep behavior disorder 12/06/2016   Renal mass 12/16/2021   Right ureteral stone    Sleep apnea    Ureteral calculus, right 06/12/2011   Vitamin D  deficiency    Weakness    Past Surgical History:  Procedure Laterality Date   ANAL FISSURE REPAIR  10/12/2000   COLONOSCOPY N/A 07/01/2012   Procedure: COLONOSCOPY;  Surgeon: Princella CHRISTELLA Nida, MD;  Location: [facility];  Service: Endoscopy;  Laterality: N/A;   COLONOSCOPY  11/23/2022   CYSTOSCOPY WITH RETROGRADE PYELOGRAM, URETEROSCOPY AND STENT  PLACEMENT Bilateral 02/26/2013   Procedure: CYSTOSCOPY WITH RETROGRADE PYELOGRAM, URETEROSCOPY AND STENT PLACEMENT;  Surgeon: Ricardo Likens, MD;  Location: [facility];  Service: Urology;  Laterality: Bilateral;   HOLMIUM LASER APPLICATION Bilateral 02/26/2013   Procedure: HOLMIUM LASER APPLICATION;  Surgeon: Ricardo Likens, MD;  Location: [facility];  Service: Urology;  Laterality: Bilateral;   kidney stone removal     06/2011-stent placement   LEFT MEDIAL FEMORAL CONDYLE DEBRIDEMENT & DRILLING/ REMOVAL LOOSE BODY  09/15/2003   NASAL SEPTUM SURGERY  05/09/1983   ROBOT ASSISTED LAPAROSCOPIC NEPHRECTOMY Right 12/16/2021   Procedure: XI ROBOTIC ASSISTED LAPAROSCOPIC RADICAL NEPHRECTOMY;  Surgeon: Likens Ricardo, MD;  Location: [facility];  Service: Urology;  Laterality: Right;  3 HRS   STONE EXTRACTION WITH BASKET  06/12/2011   Procedure: STONE EXTRACTION WITH BASKET;  Surgeon: Mark C Ottelin, MD;  Location: [facility];  Service: Urology;  Laterality: Right;   Patient Active Problem List   Diagnosis Date Noted   H/O right radical nephrectomy 11/06/2023   Pancreatic cyst 11/06/2023   Chronic UTI 11/06/2023   GAD (generalized anxiety disorder) 08/06/2023   Sialorrhea 12/28/2022   Constipation 03/24/2021   Gout 03/23/2021  Hepatic steatosis 02/18/2020   Major depression in remission (HCC) 11/11/2019   Central sleep apnea due to Cheyne-Stokes respiration 08/31/2019   Hyperlipidemia associated with type 2 diabetes mellitus (HCC) 07/09/2019   Aortic atherosclerosis (HCC) by CXR on 06/10/2018 06/10/2018   Type 2 diabetes mellitus with stage 3a chronic kidney disease, without long-term current use of insulin  (HCC) 01/21/2018   OSA treated with BiPAP 01/03/2017   REM sleep behavior disorder 12/06/2016   CKD stage 3 due to type 2 diabetes mellitus (HCC) 10/23/2016   Medication management 10/23/2016   Vitamin D  deficiency 10/23/2016   Parkinson's disease (HCC) 01/04/2016   Seasonal and  perennial allergic rhinitis 06/21/2014   Asthma, mild intermittent, well-controlled 06/21/2014   Pseudomonas aeruginosa colonization 10/08/2012   CAD (coronary artery disease) 11/15/2011   Essential hypertension 11/15/2011   Morbid obesity with BMI of 40.0-44.9, adult (HCC) 11/15/2011    ONSET DATE:  09/06/23 (referral date)  REFERRING DIAG: G20.A1 (ICD-10-CM) - Parkinson's disease without dyskinesia or fluctuating manifestations (HCC)  THERAPY DIAG:  Other symptoms and signs involving the nervous system  Other symptoms and signs involving the musculoskeletal system  Other lack of coordination  Abnormal posture  Tremor  Unsteadiness on feet  Rationale for Evaluation and Treatment: Rehabilitation  SUBJECTIVE:   SUBJECTIVE STATEMENT: Pt reports that he has been doing his exercises.  Feels like his hands are doing better.  Pt accompanied by: self  PERTINENT HISTORY:  Pt returns today for 6-98month re-evaluation as recommended when last discharged from occupational therapy 06/26/22.  Pt reports that his PD medication was incr to 4x day in June due to wearing off periods.  PMH includes:  CAD, HTN, HDL, obesity, asthma, DM, CKD, vitamin D  deficiency, REM sleep disorder, OSA, aortic atherosclerosis, hx of gout, GERD, hx of anxiety, tumor on R kidney s/p nephrectomy ([facility]--surgery only)  PRECAUTIONS: Fall  WEIGHT BEARING RESTRICTIONS: No  PAIN:  Are you having pain? No    FALLS: Has patient fallen in last 6 months? Yes. Number of falls 1 and almost 6 months ago, fell out of bed, now has adjustable bed--recently completed PT to address  LIVING ENVIRONMENT: Lives with: lives with their family and lives with their spouse Lives in: House/apartment  PLOF: Independent, Vocation/Vocational requirements: retired, and Leisure: walking, HEP  PATIENT GOALS:  improve hands  OBJECTIVE:   HAND DOMINANCE: Right  ADLs:  pt reports that some days are tougher than others, has bad  Parkinsons days about 2x/wk.  Some difficulty getting items out of pocket Transfers/ambulation related to ADLs:  min difficulty with bed mobility (mornings)--1 fall, but now has adjustable bed to go lower.  Min difficulty with sofa.  Eating: occasional assist/difficulty with cutting meat, difficulty opening unsealed bottle at times (use both hands and gripper/towel or wife opens new ones) Grooming: mod I, wife assists near ears (but better if shaves later in the morning) UB Dressing: mod I, slow, doesn't wear buttons LB Dressing: mod I, slow, has long handled shoe horn, holds on for balance Toileting: mod I Bathing: difficulty with balance in shower with eyes closed Tub Shower transfers: mod I (no grab bar/seat)  IADLs: Shopping: mod I Light housekeeping: wife does most, mod I for tasks performed, been doing some yard work  Meal Prep: wife does most, mod I for tasks performed Community mobility:  driving--speeds up some unintentionally, stays to side more, takes more effort/concentration but works to focus more, does not drive on bad days Medication management: pt has been forgetting  to take meds more (11am and 3pm dose) Financial management: wife has always performed  Handwriting: 100% legible and Moderate micrographia, printing for 3 sentences.     MOBILITY STATUS: Independent, Hx of falls, Freezing, difficulty with turns, start hesitation, and difficulty carrying objections with ambulation  Currently in PT, has noticed changes in balance.  Using 1 walking pole at times.  POSTURE COMMENTS:  rounded shoulders  ACTIVITY TOLERANCE: Activity tolerance: denies difficulty  FUNCTIONAL OUTCOME MEASURES: Standing functional reach: Right: 10 inches; Left: 13 inches Fastening/unfastening 3 buttons: 65.63sec  Physical performance test: PPT#2 (simulated eating) NT due to time constraints & PPT#4 (donning/doffing jacket): 35.89sec PPT#2 Simulated eating in 14.13sec.  (09/28/23) Upper Extremity  Functional Scale 66/80.   (09/28/23)  COORDINATION: 9 Hole Peg test: Right: 42.77 sec; Left: 41.04sec with with trunk/shoulder compensation noted with each hand (decr in-hand manipulation) Box and Blocks:  Right 46 blocks, Left 44 blocks, difficulty with hitting divider/bradykinesia with BUEs Tremors: Resting, Right, and Left, mild  UE ROM:  Bilateral shoulder flex R-135* with elbow ext -5*, L-135* with elbow ext WNL, mild decr supination bilaterally R>L and mild decr R MP ext  Otherwise bilateral UE ROM grossly WNL   UE MMT:   Not tested  SENSATION: Not tested    MUSCLE TONE: RUE: Mild, Moderate, and Rigidity and LUE: Within functional limits  COGNITION: Overall cognitive status: bradyphrenia with word recall, pt denies recent changes  VISION:  no changes per pt  OBSERVATIONS: Bradykinesia   TODAY'S TREATMENT:                        11/29/23:   PWR! Hands (basic 4) PWR! Hands (basic 4) with min cueing for large amplitude movements  Flipping cards and dealing cards with each hand with min cueing for large amplitude movements.    Practiced tying shoes with shoe on table multiple times  (last trial in 14.53sec).  Then standing to tie while shoe was on foot in 47.30, 22.34sec.   Began checking remaining goals including 9-hole peg test--see below.    Pulling scarf in each hand with mod cueing for large amplitude with min difficulty L hand and mod difficulty/cueing for R hand (modified to perform on leg at times).    Placing small pegs in pegboard by manipulating in R hand (3 in hand at a time) with min-mod difficulty.                          11/23/23:   PWR! Hands (basic 4) with min cueing for large amplitude movements (particularly with conversation/dual task).  Placing grooved pegs in pegboard with each hand with min-mod difficulty/cues for incr in-hand manipulation.   Flipping cylinder pegs with each hand by manipulating in-hand with each UE with min-mod difficulty and  cueing for compensation.     11/08/23:   Closed-chain shoulder flex and diagonals to each side in standing with min-mod cueing for large amplitude.    PWR! Hands (up, twist, step) with min-mod cueing for large amplitude.  Flipping cards with both hands simultaneously with emphasis on full finger ext and supination with mod cueing for large amplitude.  Then dealing cards with thumb with each hand with min cueing for large amplitude.    Donning/doffing jacket x3 and fastening/unfastening buttons x2 with min-mod cueing for large amplitude movement techniques.    Placing small pegs in pegboard to copy design with each hand with min-mod cueing  for large amplitude movements and for shoulder position to decr compensation and improve in-hand manipulation.    Pulling scarf in each hand with mod cueing for large amplitude with min difficulty L hand and mod difficulty/cueing for R hand (modified to perform on leg).                       11/02/23: PWR! Hands (up, twist, step) with min cueing for large amplitude movements, followed by thumb circles, finger walking (abduction/extension), isolated IP flex/ext with min cueing for large amplitude movements, particularly with R hand.  In-hand manipulation to 2 move large dice in hand to locate specific numbers with incr time/min difficulty and min difficulty to avoid shoulder compensation.  Copying 4 sentences with good legibility and size.   Pulling scarf in each hand with mod cueing for large amplitude with min difficulty L hand and mod difficulty/cueing for R hand  Placing metal pegs in wooden pegboard (various sizes) with R hand with min cueing to avoid shoulder compensation.  10/19/23:   Tying shoes in standing with foot propped in 44sec.  Completed review of hand HEP and performed the following with both hands with min-mod cueing for incr movement amplitude:  PWR! Hands (up, step), thumb circles, putty exercises (gross finger ext/abduction,  finger ext, rolling and individual finger pinch with PWR! Hands), pulling bag/scarf into palm, tossing ball from one hand to the other and then tossing up and catching with the same hand.  Issued and reviewed comprehensive HEP--see pt instructions  Reviewed ways to decr risk of future complications related to PD.  Writing 4 sentences with 100% legibility with only min decr in size approx 10% of time (min v.c. for large amplitude).    10/18/23:  PWR! Hands (up, step) with min v.c. for incr movement amplitude.  Continued review of previous coordination HEP with each hand (and updated/provided min-mod verbal, tactile cueing for large amplitude movements):  Isolated IP flexion/extension, individual finger walking, and rotating 2 balls in each hand.   Checked STGs and some LTGs--see goals section below for updates    Reviewed large amplitude movement strategies for donning/doffing jacket and fastening/unfastening buttons with min-mod cueing and practice.  Pt verbalized understanding and returned demo.  Tying shoe on tabletop (pt forgot his today).  Pt able to tie in 23sec, but primarily used L hand.    Flipping cylinder pegs in hand to manipulate with min-mod verbal, visual cueing for large amplitude and avoid shoulder compensation.     10/05/23:   PWR! Hands (up) x15 with min v.c. for incr movement amplitude.    Continued review of previous coordination HEP with each hand (and updated/provided min-mod verbal and visual cueing for large amplitude movements including use of obstacles/visual targets and PWR! hands):  Isolated IP flexion/extension, individual finger lifts--matching L and R hand height, finger walking (using pencil for targets), thumb circles (with visual targets), picking up coins with each finger and thumb, stacking coins, manipulating coins in hand to place in coin bank, rotating ball in each hand (needed visual targets with L hand).  Pt instructed in how to incorporate visual  targets in HEP at home to help incr movement amplitude.      09/28/23:   PWR! Hands (basic 4) x10 each with min cueing for large amplitude movements (verbal and visual).  Rolling ball up wall for bilateral end range shoulder flex and posture with wt. Bearing through hands on ball at end-range for proprioceptive input  with min cueing.  Reviewed the following from coordination HEP (from previous):  flipping cards, dealing cards with thumb, sliding cards across table with each hand with finger ext and min cueing for incr movement amplitude with each hand  Also assessed PPT#2 Simulated eating in 14.13sec.   Upper Extremity Functional Scale 66/80.                                                                   09/14/23:  Evaluation completed today.     PATIENT EDUCATION: Education details:  see above for cueing for large amplitude  Person educated: Patient Education method: Explanation, Demonstration, and Verbal cues Education comprehension: verbalized understanding, returned demonstration, and verbal cues required  HOME EXERCISE PROGRAM: 09/28/23 & 5/30:  Reviewed PWR! Hands (basic 4), Reviewed finger ROM HEP, and Began reviewing previous coordination HEP--see above 10/18/23:  Reviewed large amplitude movement strategies for donning/doffing jacket and fastening buttons 10/19/23:   Re-issued comprehensive HEP for coordination/hands and Ways to decr risk of future complications   GOALS: Goals reviewed with patient? Yes  SHORT TERM GOALS: Target date: 10/13/23  Pt will be independent with updated HEP. Goal status: Ongoing/partially met.  Began reviewing, but needs min cueing for large amplitude and would benefit from further review 10/18/23, 11/08/23  2.  Pt will improve coordination for ADLs/IADLs as shown by improving time on 9-hole peg test by at least 5 sec bilaterally.   Baseline:  Right: 42.77 sec; Left: 41.04sec Goal status: Not met/ongoing (not consistent).  10/18/23  R-52.48, 35.89sec.   L-66.09, 42.71sec.  11/29/23:  Not fully met.  R-38.42sec (improved by 4.35sec), L-38.80sec (improved by 2.24sec)  3.  Pt will improve bilateral overhead reach as shown by demonstrating at least 140* shoulder flex with each UE. Baseline: 135* Goal status: Met.  R-145*, L-140*  4.  Pt will improve ability to don/doff jacket and demo decr bradykinesia as shown by improving PPT#4 by at least 4sec. Baseline:  35.89sec Goal status: Met. 10/18/23--22.14sec   LONG TERM GOALS: Target date: 12/13/23  Pt will verbalize understanding of updated adaptive strategies for ADLs/IADLs to incr independence, safety, and ease. Goal status: Ongoing 11/02/23, 11/08/23, 11/29/23  2.  Pt will be able to write at least 3 sentences with only min decr in size, 100% legibility. Baseline: mod micrographia Goal status: 10/19/23--ongoing 4 sentences with 100% legibility today with only min decr in size approx 10% (will reassess additional session for consistency).  11/02/23--met, 100% legibility and no significant decr in size  3.  Pt will improve bilateral hand coordination for ADLs as shown by fastening/unfastening 3 buttons in less than 55sec. Baseline:  65.63sec Goal status: 10/18/23  Met. 42.94sec  4.  Pt will improve ability to don/doff jacket and demo decr bradykinesia as shown by improving PPT#4 by at least 8sec. Baseline:  35.89sec Goal status: Met. 10/18/23--22.14sec  5.  Pt will improve ability to tie shoe (while wearing) to complete in 80sec or less.  Baseline:  44sec  Goal status:  New/updated 10/19/23.  11/29/23:  MET.  47.30, 22.34sec      ASSESSMENT:  CLINICAL IMPRESSION: Pt is making good progress with incr ease in tying shoe and improved bilateral hand coordination.  Decr pauses noted with continuous movements overall.  PERFORMANCE DEFICITS: in functional skills including ADLs, IADLs, coordination, dexterity, tone, ROM, strength, flexibility, Fine motor control, Gross motor control, mobility,  balance, and UE functional use, cognitive skills including perception and bradyphrenia, and psychosocial skills including environmental adaptation, habits, and routines and behaviors.   IMPAIRMENTS: are limiting patient from ADLs, IADLs, rest and sleep, leisure, and social participation.   COMORBIDITIES:  may have co-morbidities  that affects occupational performance.   MODIFICATION OR ASSISTANCE TO COMPLETE EVALUATION: Min-Moderate modification of tasks or assist with assess necessary to complete an evaluation.  OT OCCUPATIONAL PROFILE AND HISTORY: Detailed assessment: Review of records and additional review of physical, cognitive, psychosocial history related to current functional performance.  CLINICAL DECISION MAKING: Moderate - several treatment options, min-mod task modification necessary  EVALUATION COMPLEXITY: Moderate    PLAN:  OT FREQUENCY: 1-2x/week  OT DURATION: 16 visits + eval over 12 weeks (due to scheduling purposes)  PLANNED INTERVENTIONS: 97535 self care/ADL training, 02889 therapeutic exercise, 97530 therapeutic activity, 97112 neuromuscular re-education, 97140 manual therapy, 97035 ultrasound, 97018 paraffin, 02989 moist heat, 97010 cryotherapy, passive range of motion, balance training, functional mobility training, visual/perceptual remediation/compensation, energy conservation, patient/family education, and DME and/or AE instructions  RECOMMENDED OTHER SERVICES: none at this time, pt current with PT at [facility]  CONSULTED AND AGREED WITH PLAN OF CARE: Patient  PLAN FOR NEXT SESSION:  anticipate d/c next visit; large amplitude movements, coordination/in-hand manipulation, review ADL strategies prn, tossing beanbags for large amplitude movements, anticipate OT re-eval in approx 6-8 months  Yoav Okane, OTR/L 11/29/2023, 8:56 AM

## 2023-11-29 ENCOUNTER — Other Ambulatory Visit (HOSPITAL_BASED_OUTPATIENT_CLINIC_OR_DEPARTMENT_OTHER): Payer: Self-pay

## 2023-11-29 ENCOUNTER — Other Ambulatory Visit: Payer: Self-pay | Admitting: Diagnostic Neuroimaging

## 2023-11-29 ENCOUNTER — Encounter: Payer: Self-pay | Admitting: Occupational Therapy

## 2023-11-29 ENCOUNTER — Ambulatory Visit: Admitting: Occupational Therapy

## 2023-11-29 DIAGNOSIS — R278 Other lack of coordination: Secondary | ICD-10-CM

## 2023-11-29 DIAGNOSIS — R2681 Unsteadiness on feet: Secondary | ICD-10-CM

## 2023-11-29 DIAGNOSIS — R251 Tremor, unspecified: Secondary | ICD-10-CM

## 2023-11-29 DIAGNOSIS — R29898 Other symptoms and signs involving the musculoskeletal system: Secondary | ICD-10-CM

## 2023-11-29 DIAGNOSIS — R293 Abnormal posture: Secondary | ICD-10-CM

## 2023-11-29 DIAGNOSIS — R29818 Other symptoms and signs involving the nervous system: Secondary | ICD-10-CM

## 2023-11-29 NOTE — Therapy (Signed)
 OUTPATIENT OCCUPATIONAL THERAPY [facility]  Patient Name: Todd Rice MRN: [id number] DOB:18-Jan-1956, 68 y.o., male Today's Date: 11/30/2023  PCP: Thersia Schuyler Stark, FNP REFERRING PROVIDER: Sherryl Bouchard, NP  END OF SESSION:  OT End of Session - 11/30/23 0800     Visit Number 10    Number of Visits 17    Date for OT Re-Evaluation 12/13/23    Authorization Type [employer] Medicare, follows Medicare guidelines,    Authorization Time Period 16 visits approved 09/28/23-12/14/23    Authorization - Number of Visits 20    Progress Note Due on Visit 10    OT Start Time 0800    OT Stop Time 0845    OT Time Calculation (min) 45 min    Activity Tolerance Patient tolerated treatment well    Behavior During Therapy [facility] for tasks assessed/performed                    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   REM sleep behavior disorder 12/06/2016   Renal mass 12/16/2021   Right ureteral stone    Sleep apnea    Ureteral calculus, right 06/12/2011   Vitamin D  deficiency    Weakness    Past Surgical History:  Procedure Laterality Date   ANAL FISSURE REPAIR  10/12/2000   COLONOSCOPY N/A 07/01/2012   Procedure: COLONOSCOPY;  Surgeon: Princella CHRISTELLA Nida, MD;  Location: [facility];  Service: Endoscopy;  Laterality: N/A;   COLONOSCOPY  11/23/2022   CYSTOSCOPY WITH RETROGRADE PYELOGRAM, URETEROSCOPY AND STENT PLACEMENT Bilateral 02/26/2013    Procedure: CYSTOSCOPY WITH RETROGRADE PYELOGRAM, URETEROSCOPY AND STENT PLACEMENT;  Surgeon: Ricardo Likens, MD;  Location: [facility];  Service: Urology;  Laterality: Bilateral;   HOLMIUM LASER APPLICATION Bilateral 02/26/2013   Procedure: HOLMIUM LASER APPLICATION;  Surgeon: Ricardo Likens, MD;  Location: [facility];  Service: Urology;  Laterality: Bilateral;   kidney stone removal     06/2011-stent placement   LEFT MEDIAL FEMORAL CONDYLE DEBRIDEMENT & DRILLING/ REMOVAL LOOSE BODY  09/15/2003   NASAL SEPTUM SURGERY  05/09/1983   ROBOT ASSISTED LAPAROSCOPIC NEPHRECTOMY Right 12/16/2021   Procedure: XI ROBOTIC ASSISTED LAPAROSCOPIC RADICAL NEPHRECTOMY;  Surgeon: Likens Ricardo, MD;  Location: [facility];  Service: Urology;  Laterality: Right;  3 HRS   STONE EXTRACTION WITH BASKET  06/12/2011   Procedure: STONE EXTRACTION WITH BASKET;  Surgeon: Mark C Ottelin, MD;  Location: [facility];  Service: Urology;  Laterality: Right;   Patient Active Problem List   Diagnosis Date Noted   H/O right radical nephrectomy 11/06/2023   Pancreatic cyst 11/06/2023   Chronic UTI 11/06/2023   GAD (generalized anxiety disorder) 08/06/2023   Sialorrhea 12/28/2022   Constipation 03/24/2021   Gout 03/23/2021   Hepatic steatosis 02/18/2020   Major  depression in remission (HCC) 11/11/2019   Central sleep apnea due to Cheyne-Stokes respiration 08/31/2019   Hyperlipidemia associated with type 2 diabetes mellitus (HCC) 07/09/2019   Aortic atherosclerosis (HCC) by CXR on 06/10/2018 06/10/2018   Type 2 diabetes mellitus with stage 3a chronic kidney disease, without long-term current use of insulin  (HCC) 01/21/2018   OSA treated with BiPAP 01/03/2017   REM sleep behavior disorder 12/06/2016   CKD stage 3 due to type 2 diabetes mellitus (HCC) 10/23/2016   Medication management 10/23/2016   Vitamin D  deficiency 10/23/2016   Parkinson's disease (HCC) 01/04/2016   Seasonal and perennial allergic rhinitis 06/21/2014    Asthma, mild intermittent, well-controlled 06/21/2014   Pseudomonas aeruginosa colonization 10/08/2012   CAD (coronary artery disease) 11/15/2011   Essential hypertension 11/15/2011   Morbid obesity with BMI of 40.0-44.9, adult (HCC) 11/15/2011    ONSET DATE:  09/06/23 (referral date)  REFERRING DIAG: G20.A1 (ICD-10-CM) - Parkinson's disease without dyskinesia or fluctuating manifestations (HCC)  THERAPY DIAG:  Other symptoms and signs involving the nervous system  Other symptoms and signs involving the musculoskeletal system  Other lack of coordination  Abnormal posture  Tremor  Unsteadiness on feet  Rationale for Evaluation and Treatment: Rehabilitation  SUBJECTIVE:   SUBJECTIVE STATEMENT: Pt reports stumbled 2x yesterday.   Wife reports that pt has been doing better since he started OT.  Pt accompanied by: self  PERTINENT HISTORY:  Pt returns today for 6-6month re-evaluation as recommended when last discharged from occupational therapy 06/26/22.  Pt reports that his PD medication was incr to 4x day in June due to wearing off periods.  PMH includes:  CAD, HTN, HDL, obesity, asthma, DM, CKD, vitamin D  deficiency, REM sleep disorder, OSA, aortic atherosclerosis, hx of gout, GERD, hx of anxiety, tumor on R kidney s/p nephrectomy ([facility]--surgery only)  PRECAUTIONS: Fall  WEIGHT BEARING RESTRICTIONS: No  PAIN:  Are you having pain? No    FALLS: Has patient fallen in last 6 months? Yes. Number of falls 1 and almost 6 months ago, fell out of bed, now has adjustable bed--recently completed PT to address  LIVING ENVIRONMENT: Lives with: lives with their family and lives with their spouse Lives in: House/apartment  PLOF: Independent, Vocation/Vocational requirements: retired, and Leisure: walking, HEP  PATIENT GOALS:  improve hands  OBJECTIVE:   HAND DOMINANCE: Right  ADLs:  pt reports that some days are tougher than others, has bad Parkinsons days about 2x/wk.  Some  difficulty getting items out of pocket Transfers/ambulation related to ADLs:  min difficulty with bed mobility (mornings)--1 fall, but now has adjustable bed to go lower.  Min difficulty with sofa.  Eating: occasional assist/difficulty with cutting meat, difficulty opening unsealed bottle at times (use both hands and gripper/towel or wife opens new ones) Grooming: mod I, wife assists near ears (but better if shaves later in the morning) UB Dressing: mod I, slow, doesn't wear buttons LB Dressing: mod I, slow, has long handled shoe horn, holds on for balance Toileting: mod I Bathing: difficulty with balance in shower with eyes closed Tub Shower transfers: mod I (no grab bar/seat)  IADLs: Shopping: mod I Light housekeeping: wife does most, mod I for tasks performed, been doing some yard work  Meal Prep: wife does most, mod I for tasks performed Community mobility:  driving--speeds up some unintentionally, stays to side more, takes more effort/concentration but works to focus more, does not drive on bad days Medication management: pt has been forgetting to take meds more (  11am and 3pm dose) Financial management: wife has always performed  Handwriting: 100% legible and Moderate micrographia, printing for 3 sentences.     MOBILITY STATUS: Independent, Hx of falls, Freezing, difficulty with turns, start hesitation, and difficulty carrying objections with ambulation  Currently in PT, has noticed changes in balance.  Using 1 walking pole at times.  POSTURE COMMENTS:  rounded shoulders  ACTIVITY TOLERANCE: Activity tolerance: denies difficulty  FUNCTIONAL OUTCOME MEASURES: Standing functional reach: Right: 10 inches; Left: 13 inches Fastening/unfastening 3 buttons: 65.63sec  Physical performance test: PPT#2 (simulated eating) NT due to time constraints & PPT#4 (donning/doffing jacket): 35.89sec PPT#2 Simulated eating in 14.13sec.  (09/28/23) Upper Extremity Functional Scale 66/80.    (09/28/23)  COORDINATION: 9 Hole Peg test: Right: 42.77 sec; Left: 41.04sec with with trunk/shoulder compensation noted with each hand (decr in-hand manipulation) Box and Blocks:  Right 46 blocks, Left 44 blocks, difficulty with hitting divider/bradykinesia with BUEs Tremors: Resting, Right, and Left, mild  UE ROM:  Bilateral shoulder flex R-135* with elbow ext -5*, L-135* with elbow ext WNL, mild decr supination bilaterally R>L and mild decr R MP ext  Otherwise bilateral UE ROM grossly WNL   UE MMT:   Not tested  SENSATION: Not tested    MUSCLE TONE: RUE: Mild, Moderate, and Rigidity and LUE: Within functional limits  COGNITION: Overall cognitive status: bradyphrenia with word recall, pt denies recent changes  VISION:  no changes per pt  OBSERVATIONS: Bradykinesia   TODAY'S TREATMENT:                        11/30/23:  PWR! Hands (basic 4) PWR! Hands (basic 4) with min cueing for large amplitude movements.  Reviewed from HEP with each hand:  finger walking, individual finger lifts, isolated IP flexion, thumb circles, rotating ball in fingertips, and rotating 2 balls in hand--all with min cueing/difficulty for large amplitude movements.    Placing small pegs in pegboard by manipulating in R hand (5 in hand at a time) with min-mod difficulty (one of each color).  Manipulating 2 1-inch blocks in R hand with min-mod difficulty/cues.    Standing, tossing beanbags with each hand/UE to target incorporating/mod cueing for wt. Shift, trunk rotation, and large amplitude movements.   11/29/23:   PWR! Hands (basic 4) PWR! Hands (basic 4) with min cueing for large amplitude movements  Flipping cards and dealing cards with each hand with min cueing for large amplitude movements.    Practiced tying shoes with shoe on table multiple times  (last trial in 14.53sec).  Then standing to tie while shoe was on foot in 47.30, 22.34sec.   Began checking remaining goals including 9-hole peg  test--see below.    Pulling scarf in each hand with mod cueing for large amplitude with min difficulty L hand and mod difficulty/cueing for R hand (modified to perform on leg at times).    Placing small pegs in pegboard by manipulating in R hand (3 in hand at a time) with min-mod difficulty.                          11/23/23:   PWR! Hands (basic 4) with min cueing for large amplitude movements (particularly with conversation/dual task).  Placing grooved pegs in pegboard with each hand with min-mod difficulty/cues for incr in-hand manipulation.   Flipping cylinder pegs with each hand by manipulating in-hand with each UE with min-mod difficulty and cueing for  compensation.     11/08/23:   Closed-chain shoulder flex and diagonals to each side in standing with min-mod cueing for large amplitude.    PWR! Hands (up, twist, step) with min-mod cueing for large amplitude.  Flipping cards with both hands simultaneously with emphasis on full finger ext and supination with mod cueing for large amplitude.  Then dealing cards with thumb with each hand with min cueing for large amplitude.    Donning/doffing jacket x3 and fastening/unfastening buttons x2 with min-mod cueing for large amplitude movement techniques.    Placing small pegs in pegboard to copy design with each hand with min-mod cueing for large amplitude movements and for shoulder position to decr compensation and improve in-hand manipulation.    Pulling scarf in each hand with mod cueing for large amplitude with min difficulty L hand and mod difficulty/cueing for R hand (modified to perform on leg).                       11/02/23: PWR! Hands (up, twist, step) with min cueing for large amplitude movements, followed by thumb circles, finger walking (abduction/extension), isolated IP flex/ext with min cueing for large amplitude movements, particularly with R hand.  In-hand manipulation to 2 move large dice in hand to locate specific numbers  with incr time/min difficulty and min difficulty to avoid shoulder compensation.  Copying 4 sentences with good legibility and size.   Pulling scarf in each hand with mod cueing for large amplitude with min difficulty L hand and mod difficulty/cueing for R hand  Placing metal pegs in wooden pegboard (various sizes) with R hand with min cueing to avoid shoulder compensation.  10/19/23:   Tying shoes in standing with foot propped in 44sec.  Completed review of hand HEP and performed the following with both hands with min-mod cueing for incr movement amplitude:  PWR! Hands (up, step), thumb circles, putty exercises (gross finger ext/abduction, finger ext, rolling and individual finger pinch with PWR! Hands), pulling bag/scarf into palm, tossing ball from one hand to the other and then tossing up and catching with the same hand.  Issued and reviewed comprehensive HEP--see pt instructions  Reviewed ways to decr risk of future complications related to PD.  Writing 4 sentences with 100% legibility with only min decr in size approx 10% of time (min v.c. for large amplitude).    10/18/23:  PWR! Hands (up, step) with min v.c. for incr movement amplitude.  Continued review of previous coordination HEP with each hand (and updated/provided min-mod verbal, tactile cueing for large amplitude movements):  Isolated IP flexion/extension, individual finger walking, and rotating 2 balls in each hand.   Checked STGs and some LTGs--see goals section below for updates    Reviewed large amplitude movement strategies for donning/doffing jacket and fastening/unfastening buttons with min-mod cueing and practice.  Pt verbalized understanding and returned demo.  Tying shoe on tabletop (pt forgot his today).  Pt able to tie in 23sec, but primarily used L hand.    Flipping cylinder pegs in hand to manipulate with min-mod verbal, visual cueing for large amplitude and avoid shoulder compensation.     10/05/23:    PWR! Hands (up) x15 with min v.c. for incr movement amplitude.    Continued review of previous coordination HEP with each hand (and updated/provided min-mod verbal and visual cueing for large amplitude movements including use of obstacles/visual targets and PWR! hands):  Isolated IP flexion/extension, individual finger lifts--matching L and R hand  height, finger walking (using pencil for targets), thumb circles (with visual targets), picking up coins with each finger and thumb, stacking coins, manipulating coins in hand to place in coin bank, rotating ball in each hand (needed visual targets with L hand).  Pt instructed in how to incorporate visual targets in HEP at home to help incr movement amplitude.      09/28/23:   PWR! Hands (basic 4) x10 each with min cueing for large amplitude movements (verbal and visual).  Rolling ball up wall for bilateral end range shoulder flex and posture with wt. Bearing through hands on ball at end-range for proprioceptive input with min cueing.  Reviewed the following from coordination HEP (from previous):  flipping cards, dealing cards with thumb, sliding cards across table with each hand with finger ext and min cueing for incr movement amplitude with each hand  Also assessed PPT#2 Simulated eating in 14.13sec.   Upper Extremity Functional Scale 66/80.                                                                   09/14/23:  Evaluation completed today.     PATIENT EDUCATION: Education details:  see above for cueing for large amplitude  Person educated: Patient and Spouse Education method: Explanation, Demonstration, and Verbal cues Education comprehension: verbalized understanding, returned demonstration, and verbal cues required  HOME EXERCISE PROGRAM: 09/28/23 & 5/30:  Reviewed PWR! Hands (basic 4), Reviewed finger ROM HEP, and Began reviewing previous coordination HEP--see above 10/18/23:  Reviewed large amplitude movement strategies for donning/doffing  jacket and fastening buttons 10/19/23:   Re-issued comprehensive HEP for coordination/hands and Ways to decr risk of future complications   GOALS: Goals reviewed with patient? Yes  SHORT TERM GOALS: Target date: 10/13/23  Pt will be independent with updated HEP. Goal status: Ongoing/partially met.  Began reviewing, but needs min cueing for large amplitude and would benefit from further review 10/18/23, 11/08/23.  Met 11/30/23  2.  Pt will improve coordination for ADLs/IADLs as shown by improving time on 9-hole peg test by at least 5 sec bilaterally.   Baseline:  Right: 42.77 sec; Left: 41.04sec Goal status: Not met/ongoing (not consistent).  10/18/23  R-52.48, 35.89sec.  L-66.09, 42.71sec.  11/29/23:  Not fully met.  R-38.42sec (improved by 4.35sec), L-38.80sec (improved by 2.24sec)--pt demo improved movement amplitude, grasp on pegs, and drops  3.  Pt will improve bilateral overhead reach as shown by demonstrating at least 140* shoulder flex with each UE. Baseline: 135* Goal status: Met.  R-145*, L-140*  4.  Pt will improve ability to don/doff jacket and demo decr bradykinesia as shown by improving PPT#4 by at least 4sec. Baseline:  35.89sec Goal status: Met. 10/18/23--22.14sec   LONG TERM GOALS: Target date: 12/13/23  Pt will verbalize understanding of updated adaptive strategies for ADLs/IADLs to incr independence, safety, and ease. Goal status: Ongoing 11/02/23, 11/08/23, 11/29/23.  Met. 11/30/23  2.  Pt will be able to write at least 3 sentences with only min decr in size, 100% legibility. Baseline: mod micrographia Goal status: 10/19/23--ongoing 4 sentences with 100% legibility today with only min decr in size approx 10% (will reassess additional session for consistency).  11/02/23--met, 100% legibility and no significant decr in size  3.  Pt  will improve bilateral hand coordination for ADLs as shown by fastening/unfastening 3 buttons in less than 55sec. Baseline:  65.63sec Goal status:  10/18/23  Met. 42.94sec  4.  Pt will improve ability to don/doff jacket and demo decr bradykinesia as shown by improving PPT#4 by at least 8sec. Baseline:  35.89sec Goal status: Met. 10/18/23--22.14sec  5.  Pt will improve ability to tie shoe (while wearing) to complete in 80sec or less.  Baseline:  44sec  Goal status:  New/updated 10/19/23.  11/29/23:  MET.  47.30, 22.34sec      ASSESSMENT:  CLINICAL IMPRESSION: Pt has made excellent progress with ADL tasks and coordination with both hands, but particularly R hand.  Pt demo improved in-hand manipulation.  Pt will benefit from continuing with HEP at home and re-evaluation in approx 6-68months (sooner if needed).  PERFORMANCE DEFICITS: in functional skills including ADLs, IADLs, coordination, dexterity, tone, ROM, strength, flexibility, Fine motor control, Gross motor control, mobility, balance, and UE functional use, cognitive skills including perception and bradyphrenia, and psychosocial skills including environmental adaptation, habits, and routines and behaviors.   IMPAIRMENTS: are limiting patient from ADLs, IADLs, rest and sleep, leisure, and social participation.   COMORBIDITIES:  may have co-morbidities  that affects occupational performance.   MODIFICATION OR ASSISTANCE TO COMPLETE EVALUATION: Min-Moderate modification of tasks or assist with assess necessary to complete an evaluation.  OT OCCUPATIONAL PROFILE AND HISTORY: Detailed assessment: Review of records and additional review of physical, cognitive, psychosocial history related to current functional performance.  CLINICAL DECISION MAKING: Moderate - several treatment options, min-mod task modification necessary  EVALUATION COMPLEXITY: Moderate    PLAN:  OT FREQUENCY: 1-2x/week  OT DURATION: 16 visits + eval over 12 weeks (due to scheduling purposes)  PLANNED INTERVENTIONS: 97535 self care/ADL training, 02889 therapeutic exercise, 97530 therapeutic activity,  97112 neuromuscular re-education, 97140 manual therapy, 97035 ultrasound, 97018 paraffin, 02989 moist heat, 97010 cryotherapy, passive range of motion, balance training, functional mobility training, visual/perceptual remediation/compensation, energy conservation, patient/family education, and DME and/or AE instructions  RECOMMENDED OTHER SERVICES: none at this time, pt current with PT at [facility]  CONSULTED AND AGREED WITH PLAN OF CARE: Patient  PLAN FOR NEXT SESSION:  d/c OT, recommend occupational therapy evaluation in approx 6-8 months (or sooner if pt notices functional changes).   OCCUPATIONAL THERAPY DISCHARGE SUMMARY  Visits from Start of Care: 10  Current functional level related to goals / functional outcomes: See above   Remaining deficits: Pt demo overall improvements, but continues to demo bradykinesia, rigidity, postural abnormality, tremor, decr balance, decr coordination due to Parkinson's.  Pt demo improved UE/hand functional use and ADL performance.   Education / Equipment: Pt instructed in updated PD-specific HEP and strategies for ADLs/IADLs as well as ways to decr risk of future complications.   Patient agrees to discharge. Patient goals were partially met. Patient is being discharged due to maximized rehab potential.  Pt met 3/4 STGs (progress toward remaining) and 5/5 LTGs.  Pt /wife reports improved use of hands and agrees to d/c.  Pt would benefit from occupational therapy re-evaluation in approx 6-8 months due to progressive nature of diagnosis.  Pt agrees with recommendation.     Jakiah Bienaime, OTR/L 11/30/2023, 8:17 AM

## 2023-11-30 ENCOUNTER — Other Ambulatory Visit: Payer: Self-pay | Admitting: Diagnostic Neuroimaging

## 2023-11-30 ENCOUNTER — Encounter: Payer: Self-pay | Admitting: Occupational Therapy

## 2023-11-30 ENCOUNTER — Other Ambulatory Visit (HOSPITAL_BASED_OUTPATIENT_CLINIC_OR_DEPARTMENT_OTHER): Payer: Self-pay

## 2023-11-30 ENCOUNTER — Ambulatory Visit: Admitting: Occupational Therapy

## 2023-11-30 DIAGNOSIS — R29818 Other symptoms and signs involving the nervous system: Secondary | ICD-10-CM | POA: Diagnosis not present

## 2023-11-30 DIAGNOSIS — R278 Other lack of coordination: Secondary | ICD-10-CM

## 2023-11-30 DIAGNOSIS — R29898 Other symptoms and signs involving the musculoskeletal system: Secondary | ICD-10-CM

## 2023-11-30 DIAGNOSIS — R251 Tremor, unspecified: Secondary | ICD-10-CM

## 2023-11-30 DIAGNOSIS — R293 Abnormal posture: Secondary | ICD-10-CM

## 2023-11-30 DIAGNOSIS — R2681 Unsteadiness on feet: Secondary | ICD-10-CM

## 2023-12-01 ENCOUNTER — Other Ambulatory Visit (HOSPITAL_BASED_OUTPATIENT_CLINIC_OR_DEPARTMENT_OTHER): Payer: Self-pay

## 2023-12-03 ENCOUNTER — Other Ambulatory Visit (HOSPITAL_BASED_OUTPATIENT_CLINIC_OR_DEPARTMENT_OTHER): Payer: Self-pay

## 2023-12-03 ENCOUNTER — Other Ambulatory Visit: Payer: Self-pay

## 2023-12-03 ENCOUNTER — Other Ambulatory Visit (HOSPITAL_COMMUNITY): Payer: Self-pay

## 2023-12-03 MED ORDER — CARBIDOPA-LEVODOPA 25-250 MG PO TABS
1.0000 | ORAL_TABLET | Freq: Four times a day (QID) | ORAL | 1 refills | Status: DC
  Filled 2023-12-03: qty 360, 90d supply, fill #0

## 2023-12-03 NOTE — Telephone Encounter (Signed)
 Pt called to  follow up on medication request .Pharmacy informed Pt that they have not received refill  on Pt medication    carbidopa -levodopa  (SINEMET  IR) 25-250 MG tablet   [facility] (Ph: [phone])

## 2023-12-04 ENCOUNTER — Other Ambulatory Visit (HOSPITAL_COMMUNITY): Payer: Self-pay

## 2023-12-04 ENCOUNTER — Telehealth: Payer: Self-pay

## 2023-12-04 ENCOUNTER — Other Ambulatory Visit (HOSPITAL_BASED_OUTPATIENT_CLINIC_OR_DEPARTMENT_OTHER): Payer: Self-pay

## 2023-12-04 ENCOUNTER — Other Ambulatory Visit: Payer: Self-pay

## 2023-12-04 NOTE — Telephone Encounter (Signed)
 Pharmacy Patient Advocate Encounter   Received notification from RX Request Messages that prior authorization for Carbidopa -levodopa  IR 25-250 mg tablet is required/requested.   Insurance verification completed.   The patient is insured through [employer] .   Per test claim: Refill too soon. PA is not needed at this time. Medication was filled 12/03/2023. Next eligible fill date is 02/09/2024.

## 2023-12-04 NOTE — Progress Notes (Addendum)
 12/04/2023 Name: Todd Rice MRN: [id number] DOB: Feb 19, 1956  Chief Complaint  Patient presents with   Diabetes    Todd Rice is a 68 y.o. year old male who presented for a telephone visit.   They were referred to the pharmacist by their PCP for assistance in managing medication access, specifically Ozempic .    Subjective:  Care Team: Primary Care Provider: Knute Thersia Bitters, FNP ; Next Scheduled Visit: 02/20/2024   Medication Access/Adherence  Current Pharmacy:  [facility] Todd Rice [address] [place] [address] Phone: [phone] Fax: [phone]  [facility] [place], [place] - [address] [place] [address] Phone: [phone] Fax: [phone]   Patient reports affordability concerns with their medications: No  Ozempic  PAP medications are now being sent to clinic with no issues, per patient.  Patient reports access/transportation concerns to their pharmacy: No  Patient reports adherence concerns with their medications:  No      Diabetes:  Current medications: Ozempic  0.5mg  weekly (Tuesday, start 10/16/2023), metformin  XR 500 mg twice daily (dose reduced 11/06/2023) Other medications related to diabetes management: rosuvastatin  discontinued due to improved lab values per patient with his discussion with PCP.   Current glucose readings: FBG 110 (typically <120); post-meal: 135-154  Using BGM meter; testing two times daily   Patient denies hypoglycemic s/sx including dizziness, shakiness, sweating. Patient denies hyperglycemic symptoms including polyuria, polydipsia, polyphagia, nocturia, neuropathy, blurred vision.  Current meal patterns: reduce red meat, smaller portions and eat slower to remain full, drinks lots of water  - Breakfast: apple, fruit   Current physical activity: walks 6 days/week; completed PT/OT recently, golf once weekly   The  ASCVD Risk score (Arnett DK, et al., 2019) failed to calculate for the following reasons:   The valid total cholesterol range is 130 to 320 mg/dL Patient was taking rosuvastatin  20 mg nightly, but was recently stopped    Objective:  Lab Results  Component Value Date   HGBA1C 7.0 (H) 11/06/2023    Lab Results  Component Value Date   CREATININE 1.64 (H) 11/06/2023   BUN 27 11/06/2023   NA 135 11/06/2023   K 4.6 11/06/2023   CL 96 11/06/2023   CO2 20 11/06/2023    Lab Results  Component Value Date   CHOL 97 (L) 11/06/2023   HDL 35 (L) 11/06/2023   LDLCALC 38 11/06/2023   TRIG 140 11/06/2023   CHOLHDL 2.8 11/06/2023    Medications Reviewed Today     Reviewed by Todd Rice, RPH (Pharmacist) on 12/04/23 at 1414  Med List Status: <None>   Medication Order Taking? Sig Documenting Provider Last Dose Status Informant  albuterol  (VENTOLIN  HFA) 108 (90 Base) MCG/ACT inhaler [id number] Yes INHALE 2 PUFFS EVERY 4 HOURS (15 MINUTES APART) FOR ASTHMA RESCUE Todd Rice, Tonya, NP  Active   allopurinol  (ZYLOPRIM ) 300 MG tablet [id number] Yes Take 1 tablet (300 mg total) by mouth daily to prevent gout. Webb, Todd B, FNP  Active   ALPRAZolam  (XANAX ) 0.5 MG tablet [id number] Yes Take 1 tab up to three times a day only if needed for severe anxiety or panic attack. Avoid daily use. Todd President, NP  Active   aspirin  EC 81 MG tablet [id number] Yes Take 81 mg by mouth daily. Swallow whole. [provider]  Active   calcium  carbonate (TUMS - DOSED IN MG ELEMENTAL CALCIUM ) 500 MG chewable tablet [id number] Yes Chew 1 tablet by mouth daily as needed  for indigestion or heartburn. [provider]  Active Self  carbidopa -levodopa  (SINEMET  IR) 25-250 MG tablet [id number] Yes Take 1 tablet by mouth 4 (four) times daily. Todd Rice, Megan, NP  Active   Cholecalciferol (VITAMIN D ) 50 MCG (2000 UT) tablet [id number] Yes Take 2,000 Units by mouth 2 (two) times daily. [provider]   Active Self  ezetimibe  (ZETIA ) 10 MG tablet [id number] Yes Take 1 tablet (10 mg total) by mouth daily for cholesterol. Todd Rice, Todd E, NP  Active    Patient not taking:    Discontinued 12/04/23 1410 (Completed Course)            Med Note>> Todd Rice, [facility]   12/04/2023  2:10 PM Abx completed    glycopyrrolate  (ROBINUL ) 1 MG tablet [id number] Yes Take 1 tablet (1 mg total) by mouth 2 (two) times daily as needed. Todd Duos, MD  Active   Magnesium  250 MG TABS [id number] Yes Take 1 tablet (250 mg total) by mouth 2 (two) times daily with a meal. Todd Knee, NP  Active Self  Menthol, Topical Analgesic, (BIOFREEZE EX) [id number] Yes Apply 1 Application topically daily as needed (pain). [provider]  Active Self  metFORMIN  (GLUCOPHAGE -XR) 500 MG 24 hr tablet [id number] Yes Take 1 tablet (500 mg total) by mouth 2 (two) times daily. Todd Thersia Bitters, FNP  Active   polyethylene glycol powder (GLYCOLAX /MIRALAX ) 17 GM/SCOOP powder [id number] Yes Take 17 g by mouth every other day. Alternating days with senna [provider]  Active Self  polyethylene glycol powder (GLYCOLAX /MIRALAX ) container 255 g [id number]   Todd Ricardo KATHEE Mickey., MD  Active   pramipexole  (MIRAPEX ) 1 MG tablet [id number] Yes Take 1 tablet (1 mg total) by mouth 3 (three) times daily. Todd Rice, Megan, NP  Active   senna (SENOKOT) 8.6 MG tablet [id number] Yes Take 2 tablets by mouth every other day. Alternating days with miralax  [provider]  Active Self  sodium chloride  (OCEAN) 0.65 % SOLN nasal spray [id number] Yes Place 1 spray into both nostrils as needed for congestion. [provider]  Active Self  tamsulosin  (FLOMAX ) 0.4 MG CAPS capsule [id number] Yes Take 1 capsule (0.4 mg total) by mouth daily.   Active   valsartan -hydrochlorothiazide  (DIOVAN -HCT) 320-25 MG tablet [id number] Yes Take 1 tablet by mouth daily for blood pressure Todd Rice, Todd E, NP  Active                Assessment/Plan:   Diabetes: - Currently controlled per last hemoglobin A1c and reported fasting blood glucose is below goal - Reviewed long term cardiovascular and renal outcomes of uncontrolled blood sugar - Reviewed goal A1c, goal fasting, and goal 2 hour post prandial glucose - Reviewed diet: continue lower carbohydrate diet  - Reviewed lifestyle/exercise: continue frequent walks, golfing, etc.; 150 minutes/ week of moderate exercise  - Recommend to CONTINUE Ozempic  0.5 mg weekly and metformin  1000 mg twice daily. DISCONTINUE glipizide . Next Ozempic  dose increase is scheduled for 12/11/2023 to 1.0 mg weekly. As Ozempic  dose increase and patients fasting/post prandial blood glucose, and hemoglobin A1c remains below goal, future consideration for decreasing metformin  if it is appropriate.  - Recommend to check glucose once daily (fasting) and for symptoms of hypoglycemia and patient was agreeable  Quality/Misc. Review:  - Rosuvastatin  discontinued, unrelated to side effects, due to improved labs. Patient will fail Statin Use in Diabetes (SUPD ) CMS Star Rated measure. Will collaborate with PCP prior to next appointment.  - If patient tolerates Ozempic   and remains on therapy for the next fill, it will be due in late September or early October.    Follow Up Plan: 4 weeks (telephone) with PharmD  Dorcas Solian, PharmD Clinical Pharmacist Cell: [phone]

## 2023-12-25 ENCOUNTER — Other Ambulatory Visit: Payer: Self-pay

## 2023-12-25 NOTE — Progress Notes (Signed)
 12/25/2023 Name: Todd Rice MRN: [id number] DOB: December 13, 1955  No chief complaint on file.   Todd Rice is a 68 y.o. year old male who presented for a telephone visit.   They were referred to the pharmacist by their PCP for assistance in managing medication access, specifically Ozempic .    Subjective:  Care Team: Primary Care Provider: Knute Thersia Bitters, FNP ; Next Scheduled Visit: 02/20/2024   Medication Access/Adherence  Current Pharmacy:  [facility] Davene Salome Bernerd Venson [address] [place] [address] Phone: [phone] Fax: [phone]  [facility] [place], [place] - [address] [place] [address] Phone: [phone] Fax: [phone]   Patient reports affordability concerns with their medications: No  Ozempic  PAP medications are now being sent to clinic with no issues, per patient.  Patient reports access/transportation concerns to their pharmacy: No  Patient reports adherence concerns with their medications:  No      Diabetes:  Current medications: Ozempic  1.0 mg weekly started on 12/11/2023 (Tuesday, 5 full boxes remaining), metformin  XR 500 mg twice daily (dose reduced 11/06/2023) -- Tolerating Ozempic  dose increase well and denies N/V/D Other medications related to diabetes management: rosuvastatin  discontinued due to improved lab values per patient with his discussion with PCP.   Current glucose readings: FBG 97-110 (today:101); post-meal: <150  Using BGM meter; testing two times daily   Patient denies hypoglycemic s/sx including dizziness, shakiness (baseline tremor), sweating. Patient denies hyperglycemic symptoms including polyuria, polydipsia, polyphagia, nocturia, neuropathy, blurred vision.  Current meal patterns: reduce red meat, smaller portions and eat slower to remain full, drinks lots of water     Current physical activity: walks 6 days/week;  completed PT/OT, golf once weekly   The ASCVD Risk score (Arnett DK, et al., 2019) failed to calculate for the following reasons:   The valid total cholesterol range is 130 to 320 mg/dL Patient was taking rosuvastatin  20 mg nightly, but was recently stopped    Objective:  Lab Results  Component Value Date   HGBA1C 7.0 (H) 11/06/2023    Lab Results  Component Value Date   CREATININE 1.64 (H) 11/06/2023   BUN 27 11/06/2023   NA 135 11/06/2023   K 4.6 11/06/2023   CL 96 11/06/2023   CO2 20 11/06/2023    Lab Results  Component Value Date   CHOL 97 (L) 11/06/2023   HDL 35 (L) 11/06/2023   LDLCALC 38 11/06/2023   TRIG 140 11/06/2023   CHOLHDL 2.8 11/06/2023    Medications Reviewed Today   Medications were not reviewed in this encounter       Assessment/Plan:   Diabetes: - Currently controlled per last hemoglobin A1c, yet not below goal, and reported fasting blood glucose is below goal. Patient's blood glucose is fairly controlled on the current.  - Reviewed long term cardiovascular and renal outcomes of uncontrolled blood sugar - Reviewed goal A1c, goal fasting, and goal 2 hour post prandial glucose - Reviewed lifestyle/exercise: continue frequent walks, golfing, etc.; 150 minutes/ week of moderate exercise  - Recommend to CONTINUE Ozempic  1.0 mg weekly and metformin  1000 mg twice daily. Patient opposed to adjusting metformin  right now due to concern of A1 when it is checked and going back to previous dose. As Ozempic  dose increase and patients fasting/post prandial blood glucose, and hemoglobin A1c remains below goal, future consideration for decreasing metformin  if it is appropriate.  - Recommend to check glucose once daily (fasting) and for symptoms of  hypoglycemia and patient was agreeable  Quality/Misc. Review:  - Rosuvastatin  discontinued, unrelated to side effects, due to improved labs. Patient will fail Statin Use in Diabetes (SUPD) CMS Star Rated measure. Will  collaborate with PCP prior to next appointment.  - If patient tolerates Ozempic  and remains on therapy for the next fill, follow up on Ozempic  refill  in late September or early October.    Follow Up Plan: 6 weeks (telephone) with PharmD  Dorcas Solian, PharmD Clinical Pharmacist Cell: [phone]

## 2024-01-21 ENCOUNTER — Encounter: Payer: Self-pay | Admitting: Neurology

## 2024-01-21 ENCOUNTER — Ambulatory Visit: Admitting: Neurology

## 2024-01-21 VITALS — BP 122/70 | HR 82 | Ht 66.0 in | Wt 234.0 lb

## 2024-01-21 DIAGNOSIS — G4733 Obstructive sleep apnea (adult) (pediatric): Secondary | ICD-10-CM

## 2024-01-21 DIAGNOSIS — G4752 REM sleep behavior disorder: Secondary | ICD-10-CM | POA: Diagnosis not present

## 2024-01-21 DIAGNOSIS — K117 Disturbances of salivary secretion: Secondary | ICD-10-CM | POA: Diagnosis not present

## 2024-01-21 DIAGNOSIS — G20B1 Parkinson's disease with dyskinesia, without mention of fluctuations: Secondary | ICD-10-CM | POA: Diagnosis not present

## 2024-01-21 MED ORDER — CARBIDOPA-LEVODOPA 25-250 MG PO TABS: 1.0000 | ORAL_TABLET | Freq: Four times a day (QID) | ORAL | 3 refills | Status: AC

## 2024-01-21 NOTE — Progress Notes (Signed)
 Chief Complaint  Patient presents with   Follow-up    RM 14 parkinson's, wife in room      ASSESSMENT AND PLAN  Todd Rice is a 68 y.o. male   Idiopathic Parkinson's disease  Doing well taking Sinemet  25/100 mg 4 times a day  And Mirapex  1mg  tid.  REM sleep disorder  Suggested melatonin 5 mg at bedtime  May also add on fourth dose of Mirapex , 1 mg before bedtime, the next step would be low-dose clonazepam 0.25 to 0.5 mg every night  Sialorrhea  Improved after he began to suck on ice chips or sugarless candy to stimulate swallowing reflex,  Only use glycopyrrolate  1mg  occasionally   Obstructive sleep apnea,  Tolerating CPAP  RTC in 6  months  DIAGNOSTIC DATA (LABS, IMAGING, TESTING) - I reviewed patient records, labs, notes, testing and imaging myself where available.   MEDICAL HISTORY:  Todd Rice, Todd Rice a 68 year old male, follow-up for Parkinson's disease, sialorrhea, obstructive sleep apnea  I reviewed and summarized the referring note.PMHX Obesity Parkinson's disease OSA DM HLD HTN  He was diagnosed of Parkinson's disease since 2017, responding well to Sinemet  ,  initially low dose, then the dosage was increased to 4 times a day since beginning of 2024, 25/100 mg 4 times a day at 7, 11, 3 and 7 PM, higher dose of Sinemet  helped him move better, no longer have noticeable wearing off, he remain active, mowing the yard, ride bicycle few times each week, no significant gait abnormality  Since June 2024, he began to notice excessive drooling, intermittent, sometimes worse than the others, he has to wipe his mouth corner often, sometimes wake up in the middle of the night, saliva right his pillowcase  He has not tried other treatment for his excessive sialorrhea yet,  He was given the prescription of glycopyrrolate  1 mg twice daily as needed, since last visit in August 2024, works well for his sialorrhea, only taking it as needed, he was approved for Botox  injection, but decided not to proceed  UPDATE Sept 15 2025: He is with his wife, complains of vivid dreams, moving a lot during his dream, kicking and talking, comply with his CPAP,   He remains active, walk his dog, mowing the yard, recently cleaning up the house for his aunt, denies significant difficulty doing all that   He is taking sinemet  25/100 4 times a day, 7am, 11am, 3pm and 7pm, he goes to be around 10pm,   He is also on MIrpex 1mg  tid, at 7 AM, 11 AM, 3 PM,  PHYSICAL EXAM:   Vitals:   01/21/24 1005  BP: 122/70  Pulse: 82  Weight: 234 lb (106.1 kg)  Height: 5' 6 (1.676 m)     Body mass index is 37.77 kg/m.  PHYSICAL EXAMNIATION:  Gen: NAD, conversant, well nourised, well groomed             NEUROLOGICAL EXAM:  MENTAL STATUS: Speech/cognition: Obese, mild masked face, awake, alert, oriented to history taking and casual conversation CRANIAL NERVES: CN II: Visual fields are full to confrontation. Pupils are round equal and briskly reactive to light. CN III, IV, VI: extraocular movement are normal. No ptosis. CN V: Facial sensation is intact to light touch CN VII: Face is symmetric with normal eye closure  CN VIII: Hearing is normal to causal conversation. CN IX, X: Phonation is normal. CN XI: Head turning and shoulder shrug are intact CN XII: Narrow oropharyngeal space, short neck  MOTOR: Normal strength, no significant resting tremor, mild left more than right rigidity, bradykinesia  REFLEXES: Reflexes are 1 and symmetric at the biceps, triceps, knees, and ankles. Plantar responses are flexor.  SENSORY: Intact to light touch, pinprick and vibratory sensation are intact in fingers and toes.  COORDINATION: There is no trunk or limb dysmetria noted.  GAIT/STANCE: Able to get up from seated position arm crossed, steady, mildly decreased left arm swing, moderate stride, smooth turning  REVIEW OF SYSTEMS:  Full 14 system review of systems performed and  notable only for as above All other review of systems were negative.   ALLERGIES: Allergies  Allergen Reactions   Ciprofloxacin  Hives   Ceftriaxone  Hives    HOME MEDICATIONS: Current Outpatient Medications  Medication Sig Dispense Refill   albuterol  (VENTOLIN  HFA) 108 (90 Base) MCG/ACT inhaler INHALE 2 PUFFS EVERY 4 HOURS (15 MINUTES APART) FOR ASTHMA RESCUE 6.7 g 1   allopurinol  (ZYLOPRIM ) 300 MG tablet Take 1 tablet (300 mg total) by mouth daily to prevent gout. 90 tablet 3   ALPRAZolam  (XANAX ) 0.5 MG tablet Take 1 tab up to three times a day only if needed for severe anxiety or panic attack. Avoid daily use. 60 tablet 0   aspirin  EC 81 MG tablet Take 81 mg by mouth daily. Swallow whole.     calcium  carbonate (TUMS - DOSED IN MG ELEMENTAL CALCIUM ) 500 MG chewable tablet Chew 1 tablet by mouth daily as needed for indigestion or heartburn.     carbidopa -levodopa  (SINEMET  IR) 25-250 MG tablet Take 1 tablet by mouth 4 (four) times daily. 360 tablet 1   Cholecalciferol (VITAMIN D ) 50 MCG (2000 UT) tablet Take 2,000 Units by mouth 2 (two) times daily.     ezetimibe  (ZETIA ) 10 MG tablet Take 1 tablet (10 mg total) by mouth daily for cholesterol. 90 tablet 3   glycopyrrolate  (ROBINUL ) 1 MG tablet Take 1 tablet (1 mg total) by mouth 2 (two) times daily as needed. 60 tablet 11   Magnesium  250 MG TABS Take 1 tablet (250 mg total) by mouth 2 (two) times daily with a meal.     Menthol, Topical Analgesic, (BIOFREEZE EX) Apply 1 Application topically daily as needed (pain).     metFORMIN  (GLUCOPHAGE -XR) 500 MG 24 hr tablet Take 1 tablet (500 mg total) by mouth 2 (two) times daily.     polyethylene glycol powder (GLYCOLAX /MIRALAX ) 17 GM/SCOOP powder Take 17 g by mouth every other day. Alternating days with senna     pramipexole  (MIRAPEX ) 1 MG tablet Take 1 tablet (1 mg total) by mouth 3 (three) times daily. 270 tablet 3   Semaglutide ,0.25 or 0.5MG /DOS, (OZEMPIC , 0.25 OR 0.5 MG/DOSE,) 2 MG/1.5ML SOPN  Inject 0.5 mg into the skin once a week.     senna (SENOKOT) 8.6 MG tablet Take 2 tablets by mouth every other day. Alternating days with miralax      sodium chloride  (OCEAN) 0.65 % SOLN nasal spray Place 1 spray into both nostrils as needed for congestion.     tamsulosin  (FLOMAX ) 0.4 MG CAPS capsule Take 1 capsule (0.4 mg total) by mouth daily. 90 capsule 1   valsartan -hydrochlorothiazide  (DIOVAN -HCT) 320-25 MG tablet Take 1 tablet by mouth daily for blood pressure 90 tablet 3   No current facility-administered medications for this visit.   Facility-Administered Medications Ordered in Other Visits  Medication Dose Route Frequency Provider Last Rate Last Admin   polyethylene glycol powder (GLYCOLAX /MIRALAX ) container 255 g  1 Container Oral Once Manny,  Ricardo KATHEE Raddle., MD        PAST MEDICAL HISTORY: 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   REM sleep behavior disorder 12/06/2016   Renal mass 12/16/2021   Right ureteral stone    Sleep apnea    Ureteral calculus, right 06/12/2011   Vitamin D  deficiency    Weakness     PAST SURGICAL HISTORY: Past Surgical History:  Procedure Laterality Date   ANAL FISSURE REPAIR  10/12/2000   COLONOSCOPY N/A 07/01/2012   Procedure: COLONOSCOPY;  Surgeon: Princella CHRISTELLA Nida, MD;  Location: [facility];  Service: Endoscopy;  Laterality: N/A;   COLONOSCOPY  11/23/2022   CYSTOSCOPY WITH RETROGRADE PYELOGRAM, URETEROSCOPY AND STENT  PLACEMENT Bilateral 02/26/2013   Procedure: CYSTOSCOPY WITH RETROGRADE PYELOGRAM, URETEROSCOPY AND STENT PLACEMENT;  Surgeon: Ricardo Likens, MD;  Location: [facility];  Service: Urology;  Laterality: Bilateral;   HOLMIUM LASER APPLICATION Bilateral 02/26/2013   Procedure: HOLMIUM LASER APPLICATION;  Surgeon: Ricardo Likens, MD;  Location: [facility];  Service: Urology;  Laterality: Bilateral;   kidney stone removal     06/2011-stent placement   LEFT MEDIAL FEMORAL CONDYLE DEBRIDEMENT & DRILLING/ REMOVAL LOOSE BODY  09/15/2003   NASAL SEPTUM SURGERY  05/09/1983   ROBOT ASSISTED LAPAROSCOPIC NEPHRECTOMY Right 12/16/2021   Procedure: XI ROBOTIC ASSISTED LAPAROSCOPIC RADICAL NEPHRECTOMY;  Surgeon: Likens Ricardo, MD;  Location: [facility];  Service: Urology;  Laterality: Right;  3 HRS   STONE EXTRACTION WITH BASKET  06/12/2011   Procedure: STONE EXTRACTION WITH BASKET;  Surgeon: Mark C Ottelin, MD;  Location: [facility];  Service: Urology;  Laterality: Right;    FAMILY HISTORY: Family History  Problem Relation Age of Onset   Heart disease Mother        Atrial fibrillation   Hypertension Mother    Hyperlipidemia Mother    Lung cancer Father        was a smoker   Cancer Father        lung   Hyperlipidemia Brother    Melanoma Brother    Heart disease Brother        Amyloid   Heart disease Maternal Aunt    Hyperlipidemia Maternal Aunt    Hypertension Maternal Aunt    Stroke Maternal Aunt    Heart disease Paternal Grandmother    Hyperlipidemia Paternal Grandmother    Parkinson's disease Neg Hx    Sleep apnea Neg Hx    Colon cancer Neg Hx    Rectal cancer Neg Hx    Stomach cancer Neg Hx     SOCIAL HISTORY: Social History   Socioeconomic History   Marital status: Married    Spouse name: Dawn   Number of children: 0   Years of education: Not on file   Highest education level: Some  college, no degree  Occupational History    Comment: retired  Tobacco Use   Smoking  status: Former    Types: Cigars   Smokeless tobacco: Never  Vaping Use   Vaping status: Never Used  Substance and Sexual Activity   Alcohol use: No   Drug use: No    Comment: QUIT SMOKING  POT 40 YRS AGO   Sexual activity: Yes    Partners: Female  Other Topics Concern   Not on file  Social History Narrative   Right-handed.   No caffeine use.   Lives at home with his wife.   Social Drivers of Corporate investment banker Strain: Low Risk  (11/02/2023)   Overall Financial Resource Strain (CARDIA)    Difficulty of Paying Living Expenses: Not hard at all  Food Insecurity: No Food Insecurity (11/02/2023)   Hunger Vital Sign    Worried About Running Out of Food in the Last Year: Never true    Ran Out of Food in the Last Year: Never true  Transportation Needs: No Transportation Needs (11/02/2023)   PRAPARE - Administrator, Civil Service (Medical): No    Lack of Transportation (Non-Medical): No  Physical Activity: Insufficiently Active (11/02/2023)   Exercise Vital Sign    Days of Exercise per Week: 4 days    Minutes of Exercise per Session: 20 min  Stress: No Stress Concern Present (11/02/2023)   [employer] of Occupational Health - Occupational Stress Questionnaire    Feeling of Stress: Not at all  Social Connections: Socially Integrated (11/02/2023)   Social Connection and Isolation Panel    Frequency of Communication with Friends and Family: Twice a week    Frequency of Social Gatherings with Friends and Family: Once a week    Attends Religious Services: More than 4 times per year    Active Member of [employer] or Organizations: Yes    Attends Banker Meetings: More than 4 times per year    Marital Status: Married  Catering manager Violence: Not At Risk (10/15/2023)   Humiliation, Afraid, Rape, and Kick questionnaire    Fear of Current or Ex-Partner: No    Emotionally Abused: No    Physically Abused: No    Sexually Abused: No      Modena Callander, M.D.  Ph.D.  [facility] [address] [place], [place] [address] Ph: [phone] Fax: [phone]  CC:  Knute Thersia Bitters, FNP [address] [place],  [place] [address]  Caudle, Thersia Bitters, FNP

## 2024-01-29 ENCOUNTER — Other Ambulatory Visit: Payer: Self-pay | Admitting: Family Medicine

## 2024-01-29 ENCOUNTER — Other Ambulatory Visit (HOSPITAL_BASED_OUTPATIENT_CLINIC_OR_DEPARTMENT_OTHER): Payer: Self-pay

## 2024-01-29 ENCOUNTER — Other Ambulatory Visit: Payer: Self-pay

## 2024-01-29 DIAGNOSIS — E119 Type 2 diabetes mellitus without complications: Secondary | ICD-10-CM

## 2024-01-29 MED ORDER — METFORMIN HCL ER 500 MG PO TB24
1000.0000 mg | ORAL_TABLET | Freq: Two times a day (BID) | ORAL | 3 refills | Status: AC
  Filled 2024-01-29: qty 360, 90d supply, fill #0

## 2024-01-31 ENCOUNTER — Telehealth: Payer: Self-pay

## 2024-01-31 NOTE — Progress Notes (Signed)
   01/31/2024  Patient ID: Todd Rice, male   DOB: 1956-01-10, 68 y.o.   MRN: [id number]  Spoke with patient, provider conflict for tomorrow's visit so agreed to RS 10/3 1pm  Future Appointments  Date Time Provider Department Center  02/08/2024  1:00 PM Pandora Cadet, [facility] None  02/20/2024  9:30 AM Knute Thersia Bitters, FNP [facility] [id number]  04/17/2024 11:45 AM Jacelyn Lupita NOVAK, CCC-SLP [facility]  04/17/2024 12:00 PM Starlet Greig ORN, PT [facility]  07/21/2024 11:30 AM Sherryl Bouchard, NP [facility] None   Cadet Pandora, PharmD, BCGP Clinical Pharmacist  [phone]

## 2024-02-01 ENCOUNTER — Other Ambulatory Visit

## 2024-02-08 ENCOUNTER — Other Ambulatory Visit: Payer: Self-pay

## 2024-02-08 NOTE — Progress Notes (Signed)
   02/08/2024 Name: Todd Rice MRN: [id number] DOB: 1956-01-19  Chief Complaint  Patient presents with   Medication Management   Diabetes   Focused DM F/u: - Current regimen is metformin  1000mg  BID and Ozempic  1mg  once weekly per note of previous pharmacist - Asajah had left note for me to f/u on regarding potential metformin  dose reduction, appears FBG contiues at goal and no risk of hypoglycemia noted with current regimen  - No financial or medical concerns expressed at this time  F/U Plan: Agreed to follow-up PRN should there be any needs following upcoming PCP visit.   Future Appointments  Date Time Provider Department Center  02/20/2024  9:30 AM Knute Thersia Bitters, FNP [facility] [address]  04/17/2024 11:45 AM Jacelyn Lupita NOVAK, CCC-SLP [facility]  04/17/2024 12:00 PM Starlet Greig ORN, PT [facility]  07/21/2024 11:30 AM Sherryl Bouchard, NP [facility] None    Lang Sieve, PharmD, BCGP Clinical Pharmacist  [phone]

## 2024-02-20 ENCOUNTER — Encounter (HOSPITAL_BASED_OUTPATIENT_CLINIC_OR_DEPARTMENT_OTHER): Payer: Self-pay | Admitting: *Deleted

## 2024-02-20 ENCOUNTER — Telehealth (HOSPITAL_BASED_OUTPATIENT_CLINIC_OR_DEPARTMENT_OTHER): Payer: Self-pay | Admitting: *Deleted

## 2024-02-20 ENCOUNTER — Ambulatory Visit (INDEPENDENT_AMBULATORY_CARE_PROVIDER_SITE_OTHER): Admitting: Family Medicine

## 2024-02-20 ENCOUNTER — Encounter (HOSPITAL_BASED_OUTPATIENT_CLINIC_OR_DEPARTMENT_OTHER): Payer: Self-pay | Admitting: Family Medicine

## 2024-02-20 ENCOUNTER — Other Ambulatory Visit (HOSPITAL_BASED_OUTPATIENT_CLINIC_OR_DEPARTMENT_OTHER): Payer: Self-pay

## 2024-02-20 VITALS — BP 113/80 | HR 72 | Ht 66.0 in | Wt 231.6 lb

## 2024-02-20 DIAGNOSIS — F411 Generalized anxiety disorder: Secondary | ICD-10-CM | POA: Diagnosis not present

## 2024-02-20 DIAGNOSIS — E1122 Type 2 diabetes mellitus with diabetic chronic kidney disease: Secondary | ICD-10-CM

## 2024-02-20 DIAGNOSIS — Z7984 Long term (current) use of oral hypoglycemic drugs: Secondary | ICD-10-CM

## 2024-02-20 DIAGNOSIS — Z0000 Encounter for general adult medical examination without abnormal findings: Secondary | ICD-10-CM | POA: Diagnosis not present

## 2024-02-20 DIAGNOSIS — I10 Essential (primary) hypertension: Secondary | ICD-10-CM

## 2024-02-20 DIAGNOSIS — Z23 Encounter for immunization: Secondary | ICD-10-CM | POA: Diagnosis not present

## 2024-02-20 DIAGNOSIS — N183 Chronic kidney disease, stage 3 unspecified: Secondary | ICD-10-CM

## 2024-02-20 DIAGNOSIS — E1169 Type 2 diabetes mellitus with other specified complication: Secondary | ICD-10-CM

## 2024-02-20 DIAGNOSIS — E785 Hyperlipidemia, unspecified: Secondary | ICD-10-CM

## 2024-02-20 DIAGNOSIS — N1831 Chronic kidney disease, stage 3a: Secondary | ICD-10-CM

## 2024-02-20 LAB — POCT URINE DRUG SCREEN
Morphine: NOT DETECTED
POC BENZODIAZEPINES UR: NOT DETECTED
POC Cocaine UR: NOT DETECTED
POC Marijuana UR: NOT DETECTED
POC Oxycodone UR: NOT DETECTED

## 2024-02-20 MED ORDER — ALPRAZOLAM 0.5 MG PO TABS
ORAL_TABLET | ORAL | 0 refills | Status: AC
  Filled 2024-02-20: qty 30, 15d supply, fill #0

## 2024-02-20 NOTE — Telephone Encounter (Signed)
 Called [facility] to obtain fax number. Fax number for [facility] is [phone]. Records release for pt's recent eye exam has been sent.

## 2024-02-20 NOTE — Patient Instructions (Signed)
 Things to do to keep yourself healthy: - Exercise at least 30-45 minutes a day, 3-4 days a week.  - Eat a low-fat diet with lots of fruits and vegetables, up to 7-9 servings per day.  - Seatbelts can save your life. Wear them always.  - Smoke detectors on every level of your home, check batteries every year.  - Eye Doctor - have an eye exam every 1-2 years  - Safe sex - if you may be exposed to STDs, use a condom.  - No smoking, vaping, or use of any tobacco products.  - Alcohol -  If you drink, do it moderately, less than 2 drinks per day.  - No illegal drug use.  - Depression is common in our stressful world.If you're feeling down or losing interest in things you normally enjoy, please come in for a visit.  - Violence - If anyone is threatening or hurting you, please call immediately.

## 2024-02-20 NOTE — Progress Notes (Signed)
 Subjective:   Todd Rice [birth year] 02/20/2024  CC: Chief Complaint  Patient presents with   Annual Exam    Patient is here today for his physical. States he is scheduled for jury duty and wants to know if he can possibly be able to get a letter for excusal due to medical history. Also states one of his fingers will go numb at night.    HPI: Todd Rice is a 68 y.o. male who presents for a routine health maintenance exam.  Labs collected at time of visit.    DIABETES MELLITUS: Todd Rice presents for the medical management of diabetes.  Current diabetes medication regimen: Ozempic  1mg  weekly , Metformin  500mg  BID  Patient is  adhering to a diabetic diet.  Patient is  exercising regularly.  Patient is  checking BS regularly. Avg: 150 Patient is  checking their feet regularly.  Denies polydipsia, polyphagia, polyuria, open wounds or ulcers on feet.  Lab Results  Component Value Date   HGBA1C 7.0 (H) 11/06/2023    Foot Exam: 08/06/2023 Lab Results  Component Value Date   MICROALBUR 2.1 04/24/2023   MICROALBUR 0.6 03/24/2021    Wt Readings from Last 3 Encounters:  02/20/24 231 lb 9.6 oz (105.1 kg)  01/21/24 234 lb (106.1 kg)  11/06/23 244 lb (110.7 kg)    HYPERTENSION: Todd Rice presents for the medical management of hypertension.  Patient's current hypertension medication regimen is: Diovan  320-25mg  Patient is  currently taking prescribed medications for HTN.  Patient is  regularly keeping a check on BP at home.  Adhering to low sodium diet: yes Exercising Regularly: Yes Denies headache, dizziness, CP, SHOB, vision changes.   BP Readings from Last 3 Encounters:  02/20/24 113/80  01/21/24 122/70  11/06/23 100/72     HYPERLIPIDEMIA: Todd Rice presents for the medical management of hyperlipidemia.  Patient's current HLD regimen is: Zetia  10mg  (stopped Crestor  previously)  Patient is  currently taking prescribed medications for  HLD.  Adhering to heathy diet: Yes Exercising regularly: Yes Denies myalgias.  Lab Results  Component Value Date   CHOL 97 (L) 11/06/2023   HDL 35 (L) 11/06/2023   LDLCALC 38 11/06/2023   TRIG 140 11/06/2023   CHOLHDL 2.8 11/06/2023      HEALTH SCREENINGS: - Vision Screening: up to date - Dental Visits: up to date - PSA (50+): Ordered today   Lab Results  Component Value Date   PSA 2.80 04/24/2023   PSA 2.85 03/28/2022   PSA 2.69 09/20/2021     - Colonoscopy (45+): Up to date  Discussed with patient purpose of the colonoscopy is to detect colon cancer at curable precancerous or early stages  - AAA Screening: Not applicable  Men age 21-75 who have ever smoked - Lung Cancer screening with low-dose CT: Not applicable-  Adults age 67-80 who are current cigarette smokers or quit within the last 15 years. Must have 20 pack year history.   Depression and Anxiety Screen done today and results listed below:     02/20/2024    9:34 AM 11/06/2023    8:00 AM 10/15/2023    2:33 PM 08/06/2023    8:07 AM 01/10/2023    8:49 PM  Depression screen PHQ 2/9  Decreased Interest 1 0 0 0 0  Down, Depressed, Hopeless 0 0 0 0 0  PHQ - 2 Score 1 0 0 0 0  Altered sleeping 0 0 0 0   Tired, decreased energy  1 0 1 0   Change in appetite 0 0 0 0   Feeling bad or failure about yourself  0 0 0 0   Trouble concentrating 1 0 0 0   Moving slowly or fidgety/restless 0 0 0 0   Suicidal thoughts 0 0 0 0   PHQ-9 Score 3 0 1 0   Difficult doing work/chores Not difficult at all Not difficult at all Not difficult at all Not difficult at all       02/20/2024    9:36 AM 11/06/2023    8:00 AM 10/15/2023    2:33 PM 08/06/2023    8:08 AM  GAD 7 : Generalized Anxiety Score  Nervous, Anxious, on Edge 1 0 0 0  Control/stop worrying 0 0 0 0  Worry too much - different things 0 0 0 0  Trouble relaxing 0 0 0 0  Restless 0 0 0 0  Easily annoyed or irritable 1 0 0 0  Afraid - awful might happen 1 0 0 0  Total GAD  7 Score 3 0 0 0  Anxiety Difficulty Not difficult at all Not difficult at all Not difficult at all Not difficult at all    IMMUNIZATIONS:  - Tdap: Tetanus vaccination status reviewed: last tetanus booster within 10 years. - Influenza: Administered today - Pneumovax: Up to date - Prevnar: Up to date - Shingrix vaccine (50+): Recommended   Past medical history, surgical history, medications, allergies, family history and social history reviewed with patient today and changes made to appropriate areas of the chart.   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   REM sleep behavior disorder 12/06/2016   Renal mass 12/16/2021   Right ureteral stone    Sleep apnea    Ureteral calculus, right 06/12/2011   Vitamin D  deficiency    Weakness     Past Surgical History:  Procedure Laterality Date   ANAL FISSURE REPAIR  10/12/2000   COLONOSCOPY N/A 07/01/2012   Procedure: COLONOSCOPY;  Surgeon: Princella CHRISTELLA Nida, MD;  Location: [facility];  Service: Endoscopy;  Laterality: N/A;   COLONOSCOPY  11/23/2022   CYSTOSCOPY WITH RETROGRADE PYELOGRAM, URETEROSCOPY AND STENT PLACEMENT Bilateral 02/26/2013   Procedure: CYSTOSCOPY WITH RETROGRADE PYELOGRAM, URETEROSCOPY AND STENT PLACEMENT;  Surgeon: Ricardo Likens, MD;  Location: [facility];  Service: Urology;  Laterality: Bilateral;   HOLMIUM LASER APPLICATION Bilateral 02/26/2013   Procedure: HOLMIUM LASER APPLICATION;  Surgeon:  Ricardo Likens, MD;  Location: [facility];  Service: Urology;  Laterality: Bilateral;   kidney stone removal     06/2011-stent placement   LEFT MEDIAL FEMORAL CONDYLE DEBRIDEMENT & DRILLING/ REMOVAL LOOSE BODY  09/15/2003   NASAL SEPTUM SURGERY  05/09/1983   ROBOT ASSISTED LAPAROSCOPIC NEPHRECTOMY Right 12/16/2021   Procedure: XI ROBOTIC ASSISTED LAPAROSCOPIC RADICAL  NEPHRECTOMY;  Surgeon: Alvaro Hummer, MD;  Location: [facility];  Service: Urology;  Laterality: Right;  3 HRS   STONE EXTRACTION WITH BASKET  06/12/2011   Procedure: STONE EXTRACTION WITH BASKET;  Surgeon: Mark C Ottelin, MD;  Location[facility];  Service: Urology;  Laterality: Right;    Current Outpatient Medications on File Prior to Visit  Medication Sig   albuterol  (VENTOLIN  HFA) 108 (90 Base) MCG/ACT inhaler INHALE 2 PUFFS EVERY 4 HOURS (15 MINUTES APART) FOR ASTHMA RESCUE   allopurinol  (ZYLOPRIM ) 300 MG tablet Take 1 tablet (300 mg total) by mouth daily to prevent gout.   ALPRAZolam  (XANAX ) 0.5 MG tablet Take 1 tab up to three times a day only if needed for severe anxiety or panic attack. Avoid daily use.   aspirin  EC 81 MG tablet Take 81 mg by mouth daily. Swallow whole.   calcium  carbonate (TUMS - DOSED IN MG ELEMENTAL CALCIUM ) 500 MG chewable tablet Chew 1 tablet by mouth daily as needed for indigestion or heartburn.   carbidopa -levodopa  (SINEMET  IR) 25-250 MG tablet Take 1 tablet by mouth 4 (four) times daily.   Cholecalciferol (VITAMIN D ) 50 MCG (2000 UT) tablet Take 2,000 Units by mouth 2 (two) times daily.   ezetimibe  (ZETIA ) 10 MG tablet Take 1 tablet (10 mg total) by mouth daily for cholesterol.   glycopyrrolate  (ROBINUL ) 1 MG tablet Take 1 tablet (1 mg total) by mouth 2 (two) times daily as needed.   Magnesium  250 MG TABS Take 1 tablet (250 mg total) by mouth 2 (two) times daily with a meal.   melatonin 3 MG TABS tablet Take 3 mg by mouth at bedtime.   Menthol, Topical Analgesic, (BIOFREEZE EX) Apply 1  Application topically daily as needed (pain).   metFORMIN  (GLUCOPHAGE -XR) 500 MG 24 hr tablet Take 2 tablets (1,000 mg total) by mouth 2 (two) times daily.   polyethylene glycol powder (GLYCOLAX /MIRALAX ) 17 GM/SCOOP powder Take 17 g by mouth every other day. Alternating days with senna   pramipexole  (MIRAPEX ) 1 MG tablet Take 1 tablet (1 mg total) by mouth 3 (three) times daily.   Semaglutide ,0.25 or 0.5MG /DOS, (OZEMPIC , 0.25 OR 0.5 MG/DOSE,) 2 MG/1.5ML SOPN Inject 0.5 mg into the skin once a week.   senna (SENOKOT) 8.6 MG tablet Take 2 tablets by mouth every other day. Alternating days with miralax    sodium chloride  (OCEAN) 0.65 % SOLN nasal spray Place 1 spray into both nostrils as needed for congestion.   tamsulosin  (FLOMAX ) 0.4 MG CAPS capsule Take 1 capsule (0.4 mg total) by mouth daily.   valsartan -hydrochlorothiazide  (DIOVAN -HCT) 320-25 MG tablet Take 1 tablet by mouth daily for blood pressure   metFORMIN  (GLUCOPHAGE -XR) 500 MG 24 hr tablet Take 1 tablet (500 mg total) by mouth 2 (two) times daily.   Current Facility-Administered Medications on File Prior to Visit  Medication   polyethylene glycol powder (GLYCOLAX /MIRALAX ) container 255 g    Allergies  Allergen Reactions   Ciprofloxacin  Hives   Ceftriaxone  Hives     Social History   Socioeconomic History   Marital status: Married    Spouse name: Dawn   Number of children: 0   Years of education: Not on file   Highest education level: Some college, no degree  Occupational History    Comment: retired  Tobacco Use   Smoking status: Former    Types: Cigars   Smokeless tobacco: Never  Vaping Use   Vaping status: Never Used  Substance and Sexual Activity  Alcohol use: No   Drug use: No    Comment: QUIT SMOKING  POT 40 YRS AGO   Sexual activity: Yes    Partners: Female  Other Topics Concern   Not on file  Social History Narrative   Right-handed.   No caffeine use.   Lives at home with his wife.   Social Drivers  of Corporate investment banker Strain: Low Risk  (02/16/2024)   Overall Financial Resource Strain (CARDIA)    Difficulty of Paying Living Expenses: Not hard at all  Food Insecurity: No Food Insecurity (02/16/2024)   Hunger Vital Sign    Worried About Running Out of Food in the Last Year: Never true    Ran Out of Food in the Last Year: Never true  Transportation Needs: No Transportation Needs (02/16/2024)   PRAPARE - Administrator, Civil Service (Medical): No    Lack of Transportation (Non-Medical): No  Physical Activity: Insufficiently Active (02/16/2024)   Exercise Vital Sign    Days of Exercise per Week: 4 days    Minutes of Exercise per Session: 20 min  Stress: Stress Concern Present (02/16/2024)   [employer] of Occupational Health - Occupational Stress Questionnaire    Feeling of Stress: To some extent  Social Connections: Moderately Integrated (02/16/2024)   Social Connection and Isolation Panel    Frequency of Communication with Friends and Family: Never    Frequency of Social Gatherings with Friends and Family: Once a week    Attends Religious Services: More than 4 times per year    Active Member of [employer] or Organizations: Yes    Attends Engineer, structural: More than 4 times per year    Marital Status: Married  Catering manager Violence: Not At Risk (10/15/2023)   Humiliation, Afraid, Rape, and Kick questionnaire    Fear of Current or Ex-Partner: No    Emotionally Abused: No    Physically Abused: No    Sexually Abused: No   Social History   Tobacco Use  Smoking Status Former   Types: Cigars  Smokeless Tobacco Never   Social History   Substance and Sexual Activity  Alcohol Use No     Family History  Problem Relation Age of Onset   Heart disease Mother        Atrial fibrillation   Hypertension Mother    Hyperlipidemia Mother    Lung cancer Father        was a smoker   Cancer Father        lung   Hyperlipidemia Brother     Melanoma Brother    Heart disease Brother        Amyloid   Heart disease Maternal Aunt    Hyperlipidemia Maternal Aunt    Hypertension Maternal Aunt    Stroke Maternal Aunt    Heart disease Paternal Grandmother    Hyperlipidemia Paternal Grandmother    Parkinson's disease Neg Hx    Sleep apnea Neg Hx    Colon cancer Neg Hx    Rectal cancer Neg Hx    Stomach cancer Neg Hx      ROS: Denies fever, fatigue, unexplained weight loss/gain, CP, SHOB, and palpatitations. Denies neurological deficits, gastrointestinal and/or genitourinary complaints, and skin changes.   Objective:   Today's Vitals   02/20/24 0930  BP: 113/80  Pulse: 72  SpO2: 100%  Weight: 231 lb 9.6 oz (105.1 kg)  Height: 5' 6 (1.676 m)    GENERAL APPEARANCE: Well-appearing,  in NAD. Well nourished.  SKIN: Pink, warm and dry. Turgor normal. No rash, lesion, ulceration, or ecchymoses. Hair evenly distributed.  HEENT: HEAD: Normocephalic.  EYES: PERRLA. EOMI. Lids intact w/o defect. Sclera white, Conjunctiva pink w/o exudate.  EARS: External ear w/o redness, swelling, masses or lesions. EAC clear. TM's intact, translucent w/o bulging, appropriate landmarks visualized. Appropriate acuity to conversational tones.  NOSE: Septum midline w/o deformity. Nares patent, mucosa pink and non-inflamed w/o drainage. No sinus tenderness.  THROAT: Uvula midline. Oropharynx clear. Oral mucosa pink and moist.  NECK: Supple, Trachea midline. Full ROM w/o pain or tenderness. No lymphadenopathy. Thyroid  non-tender w/o enlargement or palpable masses.  RESPIRATORY: Chest wall symmetrical w/o masses. Respirations even and non-labored. Breath sounds clear to auscultation bilaterally. No wheezes, rales, rhonchi, or crackles. CARDIAC: S1, S2 present, regular rate and rhythm. No gallops, murmurs, rubs, or clicks. PMI w/o lifts, heaves, or thrills. No carotid bruits. Capillary refill <2 seconds. Peripheral pulses 2+ bilaterally. GI: Abdomen soft  w/o distention. Normoactive bowel sounds. No palpable masses or tenderness. No guarding or rebound tenderness. Liver and spleen w/o tenderness or enlargement. No CVA tenderness.  GU: Pt deferred exam. MSK: Muscle tone and strength appropriate for age, w/o atrophy or abnormal movement. EXTREMITIES: Active ROM intact, w/o tenderness, crepitus, or contracture. No obvious joint deformities or effusions. No clubbing, edema, or cyanosis.  NEUROLOGIC: CN's II-XII intact. Motor strength symmetrical. Tremor present to right upper extremity. DTR 2+ symmetric bilaterally. Mild shuffling gait.  PSYCH/MENTAL STATUS: Alert, oriented x 3. Cooperative, appropriate mood and affect.   Results for orders placed or performed in visit on 02/20/24  POCT Urine Drug Screen   Collection Time: 02/20/24 10:40 AM  Result Value Ref Range   POC Methamphetamine UR     POC Opiate Ur     POC Barbiturate UR     POC Amphetamine UR     POC Oxycodone  UR None Detected None Detected   POC Cocaine UR None Detected None Detected   POC Ecstasy UR     POC TRICYCLICS UR     POC PHENCYCLIDINE UR     POC Marijuana UR None Detected None Detected   POC Methadone UR     POC BENZODIAZEPINES UR None Detected None Detected   URINE TEMPERATURE     POC DRUG SCREEN OXIDANTS URINE     POC SPECIFIC GRAVITY URINE     POC PH URINE     Methylenedioxyamphetamine       Morphine  none detected    *Note: Due to a large number of results and/or encounters for the requested time period, some results have not been displayed. A complete set of results can be found in Results Review.    Assessment & Plan:  1. Annual physical exam (Primary) Discussed preventative screenings, vaccines, and healthy lifestyle with patient. Labs completed as part of physical today.   - PSA - TSH - CBC with Differential/Platelet  2. Encounter for immunization - Flu vaccine HIGH DOSE PF(Fluzone Trivalent)  3. Type 2 diabetes mellitus with stage 3a chronic kidney  disease, without long-term current use of insulin  (HCC) Controlled previously. Will continue on Ozempic  and provide refill pending A1c result. Will consider d/c of metformin  pending labs.  - Hemoglobin A1c  4. CKD stage 3 due to type 2 diabetes mellitus (HCC) Controlled. He is followed by Urology due so s/p radical nephrectomy. Will obtain CMP with labs today. Patient requested letter for [place] duty due to CKD and s/p nephrectomy requiring frequent  hydration and voiding which is limited in jury duty involvement. Letter provided.   - Comprehensive metabolic panel with GFR  5. Essential hypertension Stable and well controlled. Continue current regimen.   6. Hyperlipidemia associated with type 2 diabetes mellitus (HCC) Will assess control with monotherapy of Zetia  with labs today. Pt doing well with diet and exercise control.  - Lipid panel  7. GAD (generalized anxiety disorder) Alprazolam  refilled. PDMP reviewed with last fill in December 2024. UDS negative and CSA updated.  - POCT Urine Drug Screen    Orders Placed This Encounter  Procedures   Flu vaccine HIGH DOSE PF(Fluzone Trivalent)   Lipid panel   PSA   TSH   Comprehensive metabolic panel with GFR   CBC with Differential/Platelet   Hemoglobin A1c   POCT Urine Drug Screen    PATIENT COUNSELING: - Encouraged to adjust caloric intake to maintain or achieve ideal body weight, to reduce intake of dietary saturated fat and total fat, to limit sodium intake by avoiding high sodium foods and not adding table salt, and to maintain adequate dietary potassium and calcium  preferably from fresh fruits, vegetables, and low-fat dairy products.   - Advised to avoid cigarette smoking. - Discussed with the patient that most people either abstain from alcohol or drink within safe limits (<=14/week and <=4 drinks/occasion for males, <=7/weeks and <= 3 drinks/occasion for females) and that the risk for alcohol disorders and other health effects  rises proportionally with the number of drinks per week and how often a drinker exceeds daily limits. - Discussed cessation/primary prevention of drug use and availability of treatment for abuse.   - Stressed the importance of regular exercise - Injury prevention: Discussed safety belts, safety helmets, smoke detector, smoking near bedding or upholstery.  - Dental health: Discussed importance of regular tooth brushing, flossing, and dental visits.  - Sexuality: Discussed sexually transmitted diseases, partner selection, use of condoms, avoidance of unintended pregnancy  and contraceptive alternatives.   NEXT PREVENTATIVE PHYSICAL DUE IN 1 YEAR.  Return in about 3 months (around 05/22/2024) for DIABETES CHECK UP, hyperlipidemia, CKD.  Patient to reach out to office if new, worrisome, or unresolved symptoms arise or if no improvement in patient's condition. Patient verbalized understanding and is agreeable to treatment plan. All questions answered to patient's satisfaction.    Thersia Schuyler Stark, [place]

## 2024-02-20 NOTE — Telephone Encounter (Signed)
-----   Message from Thersia Bitters Caudle sent at 02/20/2024 11:38 AM EDT ----- Dr. Milissa is eye provider with [facility]. Can we get recent visit this year for care gap?

## 2024-02-21 LAB — COMPREHENSIVE METABOLIC PANEL WITH GFR
ALT: 7 IU/L (ref 0–44)
AST: 13 IU/L (ref 0–40)
Albumin: 4.3 g/dL (ref 3.9–4.9)
Alkaline Phosphatase: 84 IU/L (ref 47–123)
BUN/Creatinine Ratio: 17 (ref 10–24)
BUN: 28 mg/dL — ABNORMAL HIGH (ref 8–27)
Bilirubin Total: 0.4 mg/dL (ref 0.0–1.2)
CO2: 22 mmol/L (ref 20–29)
Calcium: 10.3 mg/dL — ABNORMAL HIGH (ref 8.6–10.2)
Chloride: 96 mmol/L (ref 96–106)
Creatinine, Ser: 1.62 mg/dL — ABNORMAL HIGH (ref 0.76–1.27)
Globulin, Total: 3.1 g/dL (ref 1.5–4.5)
Glucose: 116 mg/dL — ABNORMAL HIGH (ref 70–99)
Potassium: 4.5 mmol/L (ref 3.5–5.2)
Sodium: 135 mmol/L (ref 134–144)
Total Protein: 7.4 g/dL (ref 6.0–8.5)
eGFR: 46 mL/min/1.73 — ABNORMAL LOW (ref >59)

## 2024-02-21 LAB — LIPID PANEL
Chol/HDL Ratio: 5.6 ratio — ABNORMAL HIGH (ref 0.0–5.0)
Cholesterol, Total: 174 mg/dL (ref 100–199)
HDL: 31 mg/dL — ABNORMAL LOW (ref >39)
LDL Chol Calc (NIH): 114 mg/dL — ABNORMAL HIGH (ref 0–99)
Triglycerides: 161 mg/dL — ABNORMAL HIGH (ref 0–149)
VLDL Cholesterol Cal: 29 mg/dL (ref 5–40)

## 2024-02-21 LAB — CBC WITH DIFFERENTIAL/PLATELET
Basophils Absolute: 0 x10E3/uL (ref 0.0–0.2)
Basos: 1 %
EOS (ABSOLUTE): 0.1 x10E3/uL (ref 0.0–0.4)
Eos: 2 %
Hematocrit: 46.5 % (ref 37.5–51.0)
Hemoglobin: 15.3 g/dL (ref 13.0–17.7)
Immature Grans (Abs): 0 x10E3/uL (ref 0.0–0.1)
Immature Granulocytes: 0 %
Lymphocytes Absolute: 2 x10E3/uL (ref 0.7–3.1)
Lymphs: 24 %
MCH: 28.3 pg (ref 26.6–33.0)
MCHC: 32.9 g/dL (ref 31.5–35.7)
MCV: 86 fL (ref 79–97)
Monocytes Absolute: 0.7 x10E3/uL (ref 0.1–0.9)
Monocytes: 8 %
Neutrophils Absolute: 5.3 x10E3/uL (ref 1.4–7.0)
Neutrophils: 65 %
Platelets: 192 x10E3/uL (ref 150–450)
RBC: 5.4 x10E6/uL (ref 4.14–5.80)
RDW: 14.3 % (ref 11.6–15.4)
WBC: 8.1 x10E3/uL (ref 3.4–10.8)

## 2024-02-21 LAB — HEMOGLOBIN A1C
Est. average glucose Bld gHb Est-mCnc: 134 mg/dL
Hgb A1c MFr Bld: 6.3 % — ABNORMAL HIGH (ref 4.8–5.6)

## 2024-02-21 LAB — TSH: TSH: 3.05 u[IU]/mL (ref 0.450–4.500)

## 2024-02-21 LAB — PSA: Prostate Specific Ag, Serum: 3 ng/mL (ref 0.0–4.0)

## 2024-02-22 ENCOUNTER — Ambulatory Visit (HOSPITAL_BASED_OUTPATIENT_CLINIC_OR_DEPARTMENT_OTHER): Payer: Self-pay | Admitting: Family Medicine

## 2024-02-22 NOTE — Progress Notes (Signed)
 Hi Todd Rice, Your blood counts are stable, your thyroid  count is stable.  Your PSA has increased slightly from 2.8-3.0 which is still within normal range.  If you would like me to send these over to Dr. Lendell office please let me know.  Your A1c has improved and is down to 6.3 which is excellent and considered controlled for type 2 diabetes.  Your kidney function is stable.  Your triglycerides and LDL have increased significantly prior to last check.  With stopping Crestor , the LDL has increased from 38 up to 114.  I would recommend possibly retrying a statin therapy if you are agreeable or a different option would be Repatha which would be prescribed by the lipid clinic.  Please let me know what you would prefer to do.

## 2024-02-25 NOTE — Telephone Encounter (Signed)
 Please see message sent by pt about results.

## 2024-02-26 ENCOUNTER — Other Ambulatory Visit (HOSPITAL_BASED_OUTPATIENT_CLINIC_OR_DEPARTMENT_OTHER): Payer: Self-pay | Admitting: Family Medicine

## 2024-02-26 ENCOUNTER — Other Ambulatory Visit (HOSPITAL_BASED_OUTPATIENT_CLINIC_OR_DEPARTMENT_OTHER): Payer: Self-pay

## 2024-02-26 DIAGNOSIS — E1169 Type 2 diabetes mellitus with other specified complication: Secondary | ICD-10-CM

## 2024-02-26 MED ORDER — ROSUVASTATIN CALCIUM 20 MG PO TABS
20.0000 mg | ORAL_TABLET | Freq: Every day | ORAL | 3 refills | Status: AC
  Filled 2024-02-26: qty 90, 90d supply, fill #0
  Filled 2024-05-20: qty 90, 90d supply, fill #1

## 2024-02-27 ENCOUNTER — Other Ambulatory Visit (HOSPITAL_BASED_OUTPATIENT_CLINIC_OR_DEPARTMENT_OTHER): Payer: Self-pay

## 2024-02-27 MED ORDER — COMIRNATY 30 MCG/0.3ML IM SUSY
0.3000 mL | PREFILLED_SYRINGE | Freq: Once | INTRAMUSCULAR | 0 refills | Status: AC
  Filled 2024-02-27: qty 0.3, 1d supply, fill #0

## 2024-02-27 NOTE — Telephone Encounter (Signed)
 Pt's PSA results have been routed electronically to Dr. Alvaro.

## 2024-04-17 ENCOUNTER — Telehealth: Payer: Self-pay | Admitting: Physical Therapy

## 2024-04-17 ENCOUNTER — Ambulatory Visit: Attending: Adult Health

## 2024-04-17 ENCOUNTER — Ambulatory Visit: Admitting: Physical Therapy

## 2024-04-17 DIAGNOSIS — R2689 Other abnormalities of gait and mobility: Secondary | ICD-10-CM | POA: Insufficient documentation

## 2024-04-17 DIAGNOSIS — R471 Dysarthria and anarthria: Secondary | ICD-10-CM | POA: Insufficient documentation

## 2024-04-17 NOTE — Therapy (Signed)
 [facility] [address], [address] [place], [place], [address] Phone: [phone]   Fax:  [phone]  Patient Details  Name: Todd Rice MRN: [id number] Date of Birth: 1956/01/01 Referring Provider:  Knute Thersia Bitters, FNP  Encounter Date: 04/17/2024  Physical Therapy Parkinson's Disease Screen   Timed Up and Go test:12.06 sec (compared 10.32 sec)  10 meter walk test: 10.31 sec = 3.18 ft/sec (compared to 3.77 ft/sec)  5 time sit to stand test:14.10 sec (compared to 10.5 sec)  Patient would benefit from Physical Therapy evaluation due to slowed mobility measures compared to previous discharge.    Rosangela Fehrenbach W., PT 04/17/2024, 12:07 PM  [facility] [address], [address] [place], [place], [address] Phone: [phone]   Fax:  [phone]

## 2024-04-17 NOTE — Telephone Encounter (Signed)
 Hello,  Giang (Doug) Luis was seen for PT screens in our multi-disciplinary clinic.  Physical therapy is recommended for follow up due to slowed mobility deficits compared to previous therapy discharge.  If you agree, please send referral via Epic for physical therapy eval and treat.  Thank you.   Greig Anon, PT 04/17/2024 1:28 PM Phone: [phone] Fax: [phone]  [facility] [address] [place], [address] [place], [place] [address] Phone # [phone] Fax # [phone]

## 2024-04-17 NOTE — Therapy (Signed)
 [facility] [address] [place], [place], [address] Phone: [phone]   Fax:  [phone]  Patient Details  Name: Todd Rice MRN: [id number] Date of Birth: [birth year] Referring Provider:  Evonnie Stabs, DO  Encounter Date: 04/17/2024  Speech Therapy Parkinson's Disease Screen   Decibel Level today: 71dB  (WNL=70-72 dB) with sound level meter 30cm away from pt's mouth. Pt's conversational volume has increased slightly since last treatment course  Pt does not report difficulty with swallowing, which does not warrant further evaluation  Pt does endorse changes in cognition. Pt reports slower processing when the timing on his meds gets off. Setting an alarm in his phone was recommended.   Pt does does not require speech therapy services at this time. Recommend ST screen in another 6-8 months  Keajah Killough, CCC-SLP 04/17/2024, 9:07 AM  [facility] [address], [address] [place], [place], [address] Phone: [phone]   Fax:  [phone]

## 2024-04-23 ENCOUNTER — Encounter: Payer: Medicare Other | Admitting: Nurse Practitioner

## 2024-05-05 ENCOUNTER — Other Ambulatory Visit: Payer: Self-pay | Admitting: Family Medicine

## 2024-05-05 ENCOUNTER — Other Ambulatory Visit (HOSPITAL_BASED_OUTPATIENT_CLINIC_OR_DEPARTMENT_OTHER): Payer: Self-pay

## 2024-05-05 DIAGNOSIS — I10 Essential (primary) hypertension: Secondary | ICD-10-CM

## 2024-05-05 MED ORDER — VALSARTAN-HYDROCHLOROTHIAZIDE 320-25 MG PO TABS
1.0000 | ORAL_TABLET | Freq: Every day | ORAL | 3 refills | Status: AC
  Filled 2024-05-05: qty 90, 90d supply, fill #0

## 2024-05-20 ENCOUNTER — Other Ambulatory Visit: Payer: Self-pay

## 2024-05-20 ENCOUNTER — Other Ambulatory Visit: Payer: Self-pay | Admitting: Family Medicine

## 2024-05-20 ENCOUNTER — Other Ambulatory Visit (HOSPITAL_BASED_OUTPATIENT_CLINIC_OR_DEPARTMENT_OTHER): Payer: Self-pay

## 2024-05-20 MED ORDER — EZETIMIBE 10 MG PO TABS
10.0000 mg | ORAL_TABLET | Freq: Every day | ORAL | 3 refills | Status: AC
  Filled 2024-05-20: qty 90, 90d supply, fill #0

## 2024-05-21 ENCOUNTER — Other Ambulatory Visit (HOSPITAL_BASED_OUTPATIENT_CLINIC_OR_DEPARTMENT_OTHER): Payer: Self-pay

## 2024-05-22 ENCOUNTER — Other Ambulatory Visit (HOSPITAL_BASED_OUTPATIENT_CLINIC_OR_DEPARTMENT_OTHER): Payer: Self-pay

## 2024-05-23 ENCOUNTER — Other Ambulatory Visit (HOSPITAL_BASED_OUTPATIENT_CLINIC_OR_DEPARTMENT_OTHER): Payer: Self-pay

## 2024-05-23 ENCOUNTER — Ambulatory Visit (HOSPITAL_BASED_OUTPATIENT_CLINIC_OR_DEPARTMENT_OTHER): Admitting: Family Medicine

## 2024-05-23 MED ORDER — TAMSULOSIN HCL 0.4 MG PO CAPS
0.4000 mg | ORAL_CAPSULE | Freq: Every day | ORAL | 0 refills | Status: AC
  Filled 2024-05-23 – 2024-05-24 (×2): qty 90, 90d supply, fill #0

## 2024-05-24 ENCOUNTER — Other Ambulatory Visit (HOSPITAL_BASED_OUTPATIENT_CLINIC_OR_DEPARTMENT_OTHER): Payer: Self-pay

## 2024-06-08 ENCOUNTER — Encounter (HOSPITAL_BASED_OUTPATIENT_CLINIC_OR_DEPARTMENT_OTHER): Payer: Self-pay | Admitting: *Deleted

## 2024-06-09 ENCOUNTER — Ambulatory Visit (HOSPITAL_BASED_OUTPATIENT_CLINIC_OR_DEPARTMENT_OTHER): Admitting: Family Medicine

## 2024-06-11 ENCOUNTER — Encounter (HOSPITAL_BASED_OUTPATIENT_CLINIC_OR_DEPARTMENT_OTHER): Payer: Self-pay | Admitting: Family Medicine

## 2024-06-11 ENCOUNTER — Ambulatory Visit (INDEPENDENT_AMBULATORY_CARE_PROVIDER_SITE_OTHER): Admitting: Family Medicine

## 2024-06-11 VITALS — BP 113/61 | HR 67 | Ht 66.0 in | Wt 233.0 lb

## 2024-06-11 DIAGNOSIS — G20B1 Parkinson's disease with dyskinesia, without mention of fluctuations: Secondary | ICD-10-CM | POA: Diagnosis not present

## 2024-06-11 DIAGNOSIS — Z7984 Long term (current) use of oral hypoglycemic drugs: Secondary | ICD-10-CM | POA: Diagnosis not present

## 2024-06-11 DIAGNOSIS — Z905 Acquired absence of kidney: Secondary | ICD-10-CM | POA: Diagnosis not present

## 2024-06-11 DIAGNOSIS — E1122 Type 2 diabetes mellitus with diabetic chronic kidney disease: Secondary | ICD-10-CM | POA: Diagnosis not present

## 2024-06-11 DIAGNOSIS — N5319 Other ejaculatory dysfunction: Secondary | ICD-10-CM | POA: Diagnosis not present

## 2024-06-11 DIAGNOSIS — I10 Essential (primary) hypertension: Secondary | ICD-10-CM | POA: Diagnosis not present

## 2024-06-11 DIAGNOSIS — N183 Chronic kidney disease, stage 3 unspecified: Secondary | ICD-10-CM

## 2024-06-11 DIAGNOSIS — E1169 Type 2 diabetes mellitus with other specified complication: Secondary | ICD-10-CM

## 2024-06-11 DIAGNOSIS — N1832 Chronic kidney disease, stage 3b: Secondary | ICD-10-CM

## 2024-06-11 DIAGNOSIS — E785 Hyperlipidemia, unspecified: Secondary | ICD-10-CM | POA: Diagnosis not present

## 2024-06-11 MED ORDER — OZEMPIC (1 MG/DOSE) 4 MG/3ML ~~LOC~~ SOPN: 1.0000 mg | PEN_INJECTOR | SUBCUTANEOUS | Status: AC

## 2024-06-11 NOTE — Progress Notes (Signed)
 "    Subjective:   Todd Rice 1955/10/20 06/11/2024  Chief Complaint  Patient presents with   Medical Management of Chronic Issues    38-month follow up; received a letter that ozempic  is not going to be covered anymore by insurance and also has been having some issues due to age.    Discussed the use of AI scribe software for clinical note transcription with the patient, who gave verbal consent to proceed.  History of Present Illness Todd Rice is a 69 year old male with diabetes and hyperlipidemia who presents for a follow-up visit and medication review.  He mentions an issue with his insurance not covering Ozempic  anymore and forgot to bring the insurance notice for review. He took his last Ozempic  injection three weeks ago and was on a dose of 1.0 mg. His blood sugars at home have been running with the highest being 130 mg/dL recently.  He has restarted Crestor  and reports no issues with it, although he notes it as 'another pill to swallow'.  He describes a concern with ejaculation, stating that 'everything feels normal' but 'when you finish the sensation, everything's there, but there's no fishes'. He states he has no ejaculate at time of orgasm. He states this began recently with the start of Flomax  by Urology. He currently sees Dr. Alvaro. Denies difficulty with ED, pain with erection or ejaculation.   DIABETES MELLITUS: Todd Rice presents for the medical management of diabetes.  Current diabetes medication regimen: Ozempic  1mg  weekly, Metformin  1000mg  BID XR Patient is  checking BS regularly. Avg: 130 Patient is  checking their feet regularly.  Denies polydipsia, polyphagia, polyuria, open wounds or ulcers on feet.   Reports adherence to his diet. No regular exercise.   Lab Results  Component Value Date   HGBA1C 6.3 (H) 02/20/2024    Foot Exam: 08/06/2023 Lab Results  Component Value Date   MICROALBUR 2.1 04/24/2023   MICROALBUR 0.6 03/24/2021     Wt Readings from Last 3 Encounters:  06/11/24 233 lb (105.7 kg)  02/20/24 231 lb 9.6 oz (105.1 kg)  01/21/24 234 lb (106.1 kg)    HYPERTENSION: Todd Rice presents for the medical management of hypertension.  Patient's current hypertension medication regimen is: Valsartan -hydrochlorothiazide  320-25mg  Patient is  currently taking prescribed medications for HTN.  Patient is  regularly keeping a check on BP at home.  Denies headache, dizziness, CP, SHOB, vision changes.   BP Readings from Last 3 Encounters:  06/11/24 113/61  02/20/24 113/80  01/21/24 122/70   CHRONIC KIDNEY DISEASE: Todd Rice presents for the medical management of Chronic Kidney Disease stage 3 s/p radical right nephrectomy due to renal papillary carcinoma.  Patient is  adhering to renal diet. Patient is  on ACE1/ARB therapy (Valsartan -hydrochlorothiazide )  Patient is  avoiding NSAIDS.     Does not attend dialysis and does not perform peritoneal dialysis.   Lab Results  Component Value Date   NA 135 02/20/2024   K 4.5 02/20/2024   CO2 22 02/20/2024   GLUCOSE 116 (H) 02/20/2024   BUN 28 (H) 02/20/2024   CREATININE 1.62 (H) 02/20/2024   CALCIUM  10.3 (H) 02/20/2024   EGFR 46 (L) 02/20/2024   GFRNONAA 40 (L) 12/18/2021    PARKINSONS:  Patient does see PT with recent screening on 04/17/2024 with recommendation for PT ttherapy due to slowed mobility measures. He had a speech screening on 04/17/2024 as well without recommendation for need for Speech therapy. Patient  currently managed on Sinamet and Mirapex . He is currently managed by [facility].    HYPERLIPIDEMIA: Todd Rice presents for the medical management of hyperlipidemia.  Patient's current HLD regimen is: Zetia  10mg , Crestor  20mg  (restarted in October 2025 by PCP after patient stopped)  Patient is  currently taking prescribed medications for HLD.  Denies myalgias.   Lab Results  Component Value Date   CHOL 174 02/20/2024   HDL 31 (L)  02/20/2024   LDLCALC 114 (H) 02/20/2024   TRIG 161 (H) 02/20/2024   CHOLHDL 5.6 (H) 02/20/2024      The following portions of the patient's history were reviewed and updated as appropriate: past medical history, past surgical history, family history, social history, allergies, medications, and problem list.   Patient Active Problem List   Diagnosis Date Noted   Anejaculation 06/11/2024   H/O right radical nephrectomy 11/06/2023   Pancreatic cyst 11/06/2023   Chronic UTI 11/06/2023   GAD (generalized anxiety disorder) 08/06/2023   Sialorrhea 12/28/2022   Constipation 03/24/2021   Gout 03/23/2021   Hepatic steatosis 02/18/2020   Major depression in remission 11/11/2019   Central sleep apnea due to Cheyne-Stokes respiration 08/31/2019   Hyperlipidemia associated with type 2 diabetes mellitus (HCC) 07/09/2019   Aortic atherosclerosis (HCC) by CXR on 06/10/2018 06/10/2018   Type 2 diabetes mellitus with stage 3a chronic kidney disease, without long-term current use of insulin  (HCC) 01/21/2018   OSA treated with BiPAP 01/03/2017   REM sleep behavior disorder 12/06/2016   CKD stage 3 due to type 2 diabetes mellitus (HCC) 10/23/2016   Medication management 10/23/2016   Vitamin D  deficiency 10/23/2016   Parkinson's disease (HCC) 01/04/2016   Seasonal and perennial allergic rhinitis 06/21/2014   Asthma, mild intermittent, well-controlled 06/21/2014   Pseudomonas aeruginosa colonization 10/08/2012   CAD (coronary artery disease) 11/15/2011   Essential hypertension 11/15/2011   Morbid obesity (HCC) 11/15/2011   Past Medical History:  Diagnosis Date   Adult BMI 50.0-59.9 kg/sq m    52.77   Allergy     Anal fissure    Anxiety    Asthma    Cancer (HCC)    kidney cancer   Depression    Diabetes mellitus without complication (HCC)    borderline   Elevated PSA 08/13/2019   H/O: gout    STABLE   Hepatic steatosis 10/01/2011   History of kidney stones    Hyperlipidemia     Hypertension    Kidney tumor    right upper kidney tumor, monitoring   OSA on CPAP    [employer] [facility]   Parkinson disease (HCC) 01/04/2016   Primary renal papillary carcinoma, 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   REM sleep behavior disorder 12/06/2016   Renal mass 12/16/2021   Right ureteral stone    Sleep apnea    Ureteral calculus, right 06/12/2011   Vitamin D  deficiency    Weakness    Past Surgical History:  Procedure Laterality Date   ANAL FISSURE REPAIR  10/12/2000   COLONOSCOPY N/A 07/01/2012   Procedure: COLONOSCOPY;  Surgeon: Princella CHRISTELLA Nida, MD;  Location: [facility];  Service: Endoscopy;  Laterality: N/A;   COLONOSCOPY  11/23/2022   CYSTOSCOPY WITH RETROGRADE PYELOGRAM, URETEROSCOPY AND STENT PLACEMENT Bilateral 02/26/2013   Procedure: CYSTOSCOPY WITH RETROGRADE PYELOGRAM, URETEROSCOPY AND STENT PLACEMENT;  Surgeon: Ricardo Likens, MD;  Location: [facility];  Service: Urology;  Laterality: Bilateral;   HOLMIUM LASER APPLICATION Bilateral 02/26/2013   Procedure: HOLMIUM LASER APPLICATION;  Surgeon: Ricardo Likens, MD;  Location:  [facility];  Service: Urology;  Laterality: Bilateral;   kidney stone removal     06/2011-stent placement   LEFT MEDIAL FEMORAL CONDYLE DEBRIDEMENT & DRILLING/ REMOVAL LOOSE BODY  09/15/2003   NASAL SEPTUM SURGERY  05/09/1983   ROBOT ASSISTED LAPAROSCOPIC NEPHRECTOMY Right 12/16/2021   Procedure: XI ROBOTIC ASSISTED LAPAROSCOPIC RADICAL NEPHRECTOMY;  Surgeon: Alvaro Hummer, MD;  Location: [facility];  Service: Urology;  Laterality: Right;  3 HRS   STONE EXTRACTION WITH BASKET  06/12/2011   Procedure: STONE EXTRACTION WITH BASKET;  Surgeon: Mark C Ottelin, MD;  Location: [facility];  Service: Urology;  Laterality: Right;   Family History  Problem Relation Age of Onset   Heart disease Mother        Atrial fibrillation   Hypertension Mother    Hyperlipidemia Mother    Lung cancer Father        was a smoker   Cancer Father        lung   Hyperlipidemia  Brother    Melanoma Brother    Heart disease Brother        Amyloid   Heart disease Maternal Aunt    Hyperlipidemia Maternal Aunt    Hypertension Maternal Aunt    Stroke Maternal Aunt    Heart disease Paternal Grandmother    Hyperlipidemia Paternal Grandmother    Parkinson's disease Neg Hx    Sleep apnea Neg Hx    Colon cancer Neg Hx    Rectal cancer Neg Hx    Stomach cancer Neg Hx    Outpatient Medications Prior to Visit  Medication Sig Dispense Refill   albuterol  (VENTOLIN  HFA) 108 (90 Base) MCG/ACT inhaler INHALE 2 PUFFS EVERY 4 HOURS (15 MINUTES APART) FOR ASTHMA RESCUE 6.7 g 1   allopurinol  (ZYLOPRIM ) 300 MG tablet Take 1 tablet (300 mg total) by mouth daily to prevent gout. 90 tablet 3   ALPRAZolam  (XANAX ) 0.5 MG tablet Take 1 tablet by mouth as needed up to twice a day or severe anxiety or panic attack. Avoid daily use. 30 tablet 0   aspirin  EC 81 MG tablet Take 81 mg by mouth daily. Swallow whole.     calcium  carbonate (TUMS - DOSED IN MG ELEMENTAL CALCIUM ) 500 MG chewable tablet Chew 1 tablet by mouth daily as needed for indigestion or heartburn.     carbidopa -levodopa  (SINEMET  IR) 25-250 MG tablet Take 1 tablet by mouth 4 (four) times daily. 360 tablet 3   Cholecalciferol (VITAMIN D ) 50 MCG (2000 UT) tablet Take 2,000 Units by mouth 2 (two) times daily.     ezetimibe  (ZETIA ) 10 MG tablet Take 1 tablet (10 mg total) by mouth daily for cholesterol. 90 tablet 3   glycopyrrolate  (ROBINUL ) 1 MG tablet Take 1 tablet (1 mg total) by mouth 2 (two) times daily as needed. 60 tablet 11   Magnesium  250 MG TABS Take 1 tablet (250 mg total) by mouth 2 (two) times daily with a meal.     melatonin 3 MG TABS tablet Take 3 mg by mouth at bedtime.     Menthol, Topical Analgesic, (BIOFREEZE EX) Apply 1 Application topically daily as needed (pain).     metFORMIN  (GLUCOPHAGE -XR) 500 MG 24 hr tablet Take 2 tablets (1,000 mg total) by mouth 2 (two) times daily. 360 tablet 3   polyethylene glycol  powder (GLYCOLAX /MIRALAX ) 17 GM/SCOOP powder Take 17 g by mouth every other day. Alternating days with senna     pramipexole  (MIRAPEX ) 1 MG tablet Take  1 tablet (1 mg total) by mouth 3 (three) times daily. 270 tablet 3   rosuvastatin  (CRESTOR ) 20 MG tablet Take 1 tablet (20 mg total) by mouth at bedtime. 90 tablet 3   senna (SENOKOT) 8.6 MG tablet Take 2 tablets by mouth every other day. Alternating days with miralax      sodium chloride  (OCEAN) 0.65 % SOLN nasal spray Place 1 spray into both nostrils as needed for congestion.     tamsulosin  (FLOMAX ) 0.4 MG CAPS capsule Take 1 capsule (0.4 mg total) by mouth daily. 90 capsule 0   valsartan -hydrochlorothiazide  (DIOVAN -HCT) 320-25 MG tablet Take 1 tablet by mouth daily for blood pressure 90 tablet 3   Semaglutide ,0.25 or 0.5MG /DOS, (OZEMPIC , 0.25 OR 0.5 MG/DOSE,) 2 MG/1.5ML SOPN Inject 0.5 mg into the skin once a week. (Patient not taking: Reported on 06/11/2024)     Facility-Administered Medications Prior to Visit  Medication Dose Route Frequency Provider Last Rate Last Admin   polyethylene glycol powder (GLYCOLAX /MIRALAX ) container 255 g  1 Container Oral Once Manny, Ricardo KATHEE Raddle., MD       Allergies[1]   ROS: A complete ROS was performed with pertinent positives/negatives noted in the HPI. The remainder of the ROS are negative.    Objective:   Today's Vitals   06/11/24 1528  BP: 113/61  Pulse: 67  SpO2: 97%  Weight: 233 lb (105.7 kg)  Height: 5' 6 (1.676 m)    Physical Exam   GENERAL: Well-appearing, in NAD. Well nourished.  SKIN: Pink, warm and dry.  Head: Normocephalic. NECK: Trachea midline. Full ROM w/o pain or tenderness.  RESPIRATORY: Chest wall symmetrical. Respirations even and non-labored. Breath sounds clear to auscultation bilaterally.  CARDIAC: S1, S2 present, regular rate and rhythm without murmur or gallops. Peripheral pulses 2+ bilaterally.  MSK: Muscle tone and strength appropriate for age.  EXTREMITIES: Without  clubbing, cyanosis, or edema.  NEUROLOGIC: No sensory deficits. Upper extremity tremor present. Steady, even gait. C2-C12 intact.  PSYCH/MENTAL STATUS: Alert, oriented x 3. Cooperative, appropriate mood and affect.   Health Maintenance Due  Topic Date Due   Zoster Vaccines- Shingrix (1 of 2) Never done   Diabetic kidney evaluation - Urine ACR  04/23/2024       Assessment & Plan:  1. Hyperlipidemia associated with type 2 diabetes mellitus (HCC) (Primary) Patient tolerating restart of Crestor  and continued Zetia . Will check LP with labs today and titrate if needed.  - Lipid panel  2. Essential hypertension Stable. Continue current regimen.   3. Anejaculation Discussed likely cause of anejaculation with start of Flomax  recently by Urology. Patient denies urinary symptoms and states he will reach out to Dr. Alvaro to discuss change of medication due to adverse effect.   4. CKD stage 3 due to type 2 diabetes mellitus (HCC) 5. H/O right radical nephrectomy Stable previously with A1C and HTN controlled. Will check CBC and CMP with labs. He is also managed by Dr. Alvaro with urology s/p nephrectomy. Will continue current medications, avoidance of NSAIDS and diet.  - CBC with Differential/Platelet - Comprehensive metabolic panel with GFR   6. Parkinson's disease with dyskinesia without fluctuating manifestations (HCC) Currently managed by [facility] with Mirapex  and Sinemet . Stable per patient.   7. Morbid obesity (HCC) Discussed good dietary changes with heart healthy and diabetic dietary recommendations. Exercise is difficulty due to comorbidities limiting mobility.   8. Type 2 diabetes mellitus with stage 3b chronic kidney disease, without long-term current use of insulin  (HCC) Last A1C  is stable. Will obtain urine albumin with labs today. Discussed continuing use of Metformin  and patient will provide paperwork regarding Ozempic  formulation coverage. Will consider Mounjaro if not  covered by insurance.  - Comprehensive metabolic panel with GFR - Hemoglobin A1c - Microalbumin / creatinine urine ratio - Semaglutide , 1 MG/DOSE, (OZEMPIC , 1 MG/DOSE,) 4 MG/3ML SOPN; Inject 1 mg into the skin once a week.    Meds ordered this encounter  Medications   Semaglutide , 1 MG/DOSE, (OZEMPIC , 1 MG/DOSE,) 4 MG/3ML SOPN    Sig: Inject 1 mg into the skin once a week.    Inject 1 mg (0.75 ml)  into the Skin    every 7 days  for Diabetes  (Dx: e11.29)    Supervising Provider:   DE CUBA, RAYMOND J [[id number]]   Lab Orders         Lipid panel         CBC with Differential/Platelet         Comprehensive metabolic panel with GFR         Hemoglobin A1c         Microalbumin / creatinine urine ratio      Return in about 3 months (around 09/08/2024) for DIABETES CHECK UP.    Patient to reach out to office if new, worrisome, or unresolved symptoms arise or if no improvement in patient's condition. Patient verbalized understanding and is agreeable to treatment plan. All questions answered to patient's satisfaction.    Thersia Schuyler Stark, FNP     [1]  Allergies Allergen Reactions   Ciprofloxacin  Hives   Ceftriaxone  Hives   "

## 2024-06-11 NOTE — Patient Instructions (Addendum)
 Please bring the paperwork regarding Ozempic    Talk with Dr. Alvaro about anejaculation due to possible Flomax 

## 2024-06-12 LAB — MICROALBUMIN / CREATININE URINE RATIO
Creatinine, Urine: 105.5 mg/dL
Microalb/Creat Ratio: 42 mg/g{creat} — ABNORMAL HIGH (ref 0–29)
Microalbumin, Urine: 44.3 ug/mL

## 2024-06-12 LAB — CBC WITH DIFFERENTIAL/PLATELET
Basophils Absolute: 0 10*3/uL (ref 0.0–0.2)
Basos: 1 %
EOS (ABSOLUTE): 0.2 10*3/uL (ref 0.0–0.4)
Eos: 2 %
Hematocrit: 42.4 % (ref 37.5–51.0)
Hemoglobin: 14 g/dL (ref 13.0–17.7)
Immature Grans (Abs): 0 10*3/uL (ref 0.0–0.1)
Immature Granulocytes: 0 %
Lymphocytes Absolute: 2.5 10*3/uL (ref 0.7–3.1)
Lymphs: 30 %
MCH: 28.9 pg (ref 26.6–33.0)
MCHC: 33 g/dL (ref 31.5–35.7)
MCV: 87 fL (ref 79–97)
Monocytes Absolute: 0.8 10*3/uL (ref 0.1–0.9)
Monocytes: 9 %
Neutrophils Absolute: 4.8 10*3/uL (ref 1.4–7.0)
Neutrophils: 58 %
Platelets: 177 10*3/uL (ref 150–450)
RBC: 4.85 x10E6/uL (ref 4.14–5.80)
RDW: 13.9 % (ref 11.6–15.4)
WBC: 8.4 10*3/uL (ref 3.4–10.8)

## 2024-06-12 LAB — COMPREHENSIVE METABOLIC PANEL WITH GFR
ALT: 9 [IU]/L (ref 0–44)
AST: 15 [IU]/L (ref 0–40)
Albumin: 4.2 g/dL (ref 3.9–4.9)
Alkaline Phosphatase: 70 [IU]/L (ref 47–123)
BUN/Creatinine Ratio: 17 (ref 10–24)
BUN: 32 mg/dL — ABNORMAL HIGH (ref 8–27)
Bilirubin Total: 0.3 mg/dL (ref 0.0–1.2)
CO2: 21 mmol/L (ref 20–29)
Calcium: 10.1 mg/dL (ref 8.6–10.2)
Chloride: 102 mmol/L (ref 96–106)
Creatinine, Ser: 1.83 mg/dL — ABNORMAL HIGH (ref 0.76–1.27)
Globulin, Total: 3.1 g/dL (ref 1.5–4.5)
Glucose: 93 mg/dL (ref 70–99)
Potassium: 4.5 mmol/L (ref 3.5–5.2)
Sodium: 140 mmol/L (ref 134–144)
Total Protein: 7.3 g/dL (ref 6.0–8.5)
eGFR: 39 mL/min/{1.73_m2} — ABNORMAL LOW

## 2024-06-12 LAB — LIPID PANEL
Chol/HDL Ratio: 3.1 ratio (ref 0.0–5.0)
Cholesterol, Total: 101 mg/dL (ref 100–199)
HDL: 33 mg/dL — ABNORMAL LOW
LDL Chol Calc (NIH): 42 mg/dL (ref 0–99)
Triglycerides: 155 mg/dL — ABNORMAL HIGH (ref 0–149)
VLDL Cholesterol Cal: 26 mg/dL (ref 5–40)

## 2024-06-12 LAB — HEMOGLOBIN A1C
Est. average glucose Bld gHb Est-mCnc: 137 mg/dL
Hgb A1c MFr Bld: 6.4 % — ABNORMAL HIGH (ref 4.8–5.6)

## 2024-06-13 ENCOUNTER — Ambulatory Visit (HOSPITAL_BASED_OUTPATIENT_CLINIC_OR_DEPARTMENT_OTHER): Payer: Self-pay | Admitting: Family Medicine

## 2024-06-13 ENCOUNTER — Encounter (HOSPITAL_BASED_OUTPATIENT_CLINIC_OR_DEPARTMENT_OTHER): Payer: Self-pay | Admitting: Family Medicine

## 2024-06-13 DIAGNOSIS — E1122 Type 2 diabetes mellitus with diabetic chronic kidney disease: Secondary | ICD-10-CM

## 2024-06-13 NOTE — Progress Notes (Signed)
 Please schedule a lab appointment in 1 to 2 weeks for repeat BMP.  Todd Rice, Your triglycerides have improved slightly from last check 3 months ago.  Please continue with good dietary changes monitoring sugar intake.  Your kidney function has decreased slightly from last check.  Electrolytes are stable.  I would like to recheck your kidney function in approximately 2 weeks with you to increase clear fluids.  Your A1c is stable at 6.4 at this time.  Please see my message regarding the Ozempic  coverage.  Due to the decrease in kidney function your protein content in your urine has increased.  I would recommend we recheck this with your next appointment in 3 months.

## 2024-07-21 ENCOUNTER — Ambulatory Visit: Admitting: Adult Health

## 2024-08-13 ENCOUNTER — Ambulatory Visit: Admitting: Adult Health

## 2024-09-08 ENCOUNTER — Ambulatory Visit (HOSPITAL_BASED_OUTPATIENT_CLINIC_OR_DEPARTMENT_OTHER): Admitting: Family Medicine

## 2024-10-14 ENCOUNTER — Ambulatory Visit: Attending: Adult Health | Admitting: Physical Therapy

## 2024-12-04 ENCOUNTER — Ambulatory Visit: Attending: Adult Health
